# Patient Record
Sex: Female | Born: 1935 | Race: White | Hispanic: No | Smoking: Never smoker
Health system: Southern US, Community
[De-identification: ages and names within clinical notes are randomized; demographics above are authoritative.]

## PROBLEM LIST (undated history)

## (undated) DIAGNOSIS — Z87442 Personal history of urinary calculi: Secondary | ICD-10-CM

## (undated) DIAGNOSIS — C569 Malignant neoplasm of unspecified ovary: Secondary | ICD-10-CM

## (undated) DIAGNOSIS — C50919 Malignant neoplasm of unspecified site of unspecified female breast: Secondary | ICD-10-CM

## (undated) DIAGNOSIS — K219 Gastro-esophageal reflux disease without esophagitis: Secondary | ICD-10-CM

## (undated) DIAGNOSIS — B029 Zoster without complications: Secondary | ICD-10-CM

## (undated) DIAGNOSIS — I10 Essential (primary) hypertension: Secondary | ICD-10-CM

## (undated) DIAGNOSIS — Z9289 Personal history of other medical treatment: Secondary | ICD-10-CM

## (undated) DIAGNOSIS — M1990 Unspecified osteoarthritis, unspecified site: Secondary | ICD-10-CM

## (undated) DIAGNOSIS — N200 Calculus of kidney: Secondary | ICD-10-CM

## (undated) DIAGNOSIS — N6099 Unspecified benign mammary dysplasia of unspecified breast: Secondary | ICD-10-CM

## (undated) DIAGNOSIS — R300 Dysuria: Secondary | ICD-10-CM

## (undated) DIAGNOSIS — I2510 Atherosclerotic heart disease of native coronary artery without angina pectoris: Secondary | ICD-10-CM

## (undated) DIAGNOSIS — R011 Cardiac murmur, unspecified: Secondary | ICD-10-CM

## (undated) DIAGNOSIS — R351 Nocturia: Secondary | ICD-10-CM

## (undated) DIAGNOSIS — Z8543 Personal history of malignant neoplasm of ovary: Secondary | ICD-10-CM

## (undated) DIAGNOSIS — E785 Hyperlipidemia, unspecified: Secondary | ICD-10-CM

## (undated) DIAGNOSIS — N201 Calculus of ureter: Secondary | ICD-10-CM

## (undated) DIAGNOSIS — Z853 Personal history of malignant neoplasm of breast: Secondary | ICD-10-CM

## (undated) DIAGNOSIS — Z803 Family history of malignant neoplasm of breast: Secondary | ICD-10-CM

## (undated) DIAGNOSIS — Z85828 Personal history of other malignant neoplasm of skin: Secondary | ICD-10-CM

## (undated) HISTORY — DX: Malignant neoplasm of unspecified site of unspecified female breast: C50.919

## (undated) HISTORY — DX: Calculus of kidney: N20.0

## (undated) HISTORY — DX: Unspecified benign mammary dysplasia of unspecified breast: N60.99

## (undated) HISTORY — DX: Malignant neoplasm of unspecified ovary: C56.9

## (undated) HISTORY — DX: Family history of malignant neoplasm of breast: Z80.3

## (undated) HISTORY — DX: Essential (primary) hypertension: I10

## (undated) HISTORY — DX: Zoster without complications: B02.9

## (undated) HISTORY — PX: CARDIAC CATHETERIZATION: SHX172

## (undated) HISTORY — DX: Atherosclerotic heart disease of native coronary artery without angina pectoris: I25.10

## (undated) HISTORY — PX: COLONOSCOPY: SHX174

## (undated) HISTORY — PX: APPENDECTOMY: SHX54

## (undated) HISTORY — DX: Hyperlipidemia, unspecified: E78.5

---

## 1974-10-07 HISTORY — PX: TOTAL ABDOMINAL HYSTERECTOMY W/ BILATERAL SALPINGOOPHORECTOMY: SHX83

## 1988-10-07 HISTORY — PX: CHOLECYSTECTOMY: SHX55

## 1999-09-11 ENCOUNTER — Other Ambulatory Visit: Admission: RE | Admit: 1999-09-11 | Discharge: 1999-09-11 | Payer: Self-pay | Admitting: Obstetrics and Gynecology

## 2001-11-20 ENCOUNTER — Other Ambulatory Visit: Admission: RE | Admit: 2001-11-20 | Discharge: 2001-11-20 | Payer: Self-pay | Admitting: Obstetrics and Gynecology

## 2002-01-28 ENCOUNTER — Other Ambulatory Visit: Admission: RE | Admit: 2002-01-28 | Discharge: 2002-01-28 | Payer: Self-pay | Admitting: Radiology

## 2002-02-04 DIAGNOSIS — N6099 Unspecified benign mammary dysplasia of unspecified breast: Secondary | ICD-10-CM

## 2002-02-04 HISTORY — DX: Unspecified benign mammary dysplasia of unspecified breast: N60.99

## 2002-02-10 ENCOUNTER — Encounter (INDEPENDENT_AMBULATORY_CARE_PROVIDER_SITE_OTHER): Payer: Self-pay | Admitting: *Deleted

## 2002-02-10 ENCOUNTER — Ambulatory Visit (HOSPITAL_BASED_OUTPATIENT_CLINIC_OR_DEPARTMENT_OTHER): Admission: RE | Admit: 2002-02-10 | Discharge: 2002-02-10 | Payer: Self-pay | Admitting: General Surgery

## 2002-02-10 HISTORY — PX: OTHER SURGICAL HISTORY: SHX169

## 2004-01-23 ENCOUNTER — Ambulatory Visit (HOSPITAL_COMMUNITY): Admission: RE | Admit: 2004-01-23 | Discharge: 2004-01-23 | Payer: Self-pay

## 2004-01-23 ENCOUNTER — Encounter: Admission: RE | Admit: 2004-01-23 | Discharge: 2004-01-23 | Payer: Self-pay

## 2004-06-13 ENCOUNTER — Ambulatory Visit (HOSPITAL_COMMUNITY): Admission: RE | Admit: 2004-06-13 | Discharge: 2004-06-13 | Payer: Self-pay | Admitting: Gastroenterology

## 2004-08-25 ENCOUNTER — Ambulatory Visit (HOSPITAL_COMMUNITY): Admission: RE | Admit: 2004-08-25 | Discharge: 2004-08-25 | Payer: Self-pay | Admitting: Neurology

## 2005-02-05 ENCOUNTER — Encounter: Admission: RE | Admit: 2005-02-05 | Discharge: 2005-02-05 | Payer: Self-pay | Admitting: General Surgery

## 2005-10-23 ENCOUNTER — Ambulatory Visit (HOSPITAL_COMMUNITY): Admission: RE | Admit: 2005-10-23 | Discharge: 2005-10-23 | Payer: Self-pay | Admitting: Interventional Cardiology

## 2005-11-06 ENCOUNTER — Encounter: Admission: RE | Admit: 2005-11-06 | Discharge: 2005-11-06 | Payer: Self-pay | Admitting: Interventional Cardiology

## 2005-11-11 ENCOUNTER — Inpatient Hospital Stay (HOSPITAL_BASED_OUTPATIENT_CLINIC_OR_DEPARTMENT_OTHER): Admission: RE | Admit: 2005-11-11 | Discharge: 2005-11-11 | Payer: Self-pay | Admitting: Interventional Cardiology

## 2007-02-18 ENCOUNTER — Encounter: Admission: RE | Admit: 2007-02-18 | Discharge: 2007-02-18 | Payer: Self-pay | Admitting: Radiology

## 2008-01-05 ENCOUNTER — Ambulatory Visit (HOSPITAL_COMMUNITY): Admission: RE | Admit: 2008-01-05 | Discharge: 2008-01-05 | Payer: Self-pay | Admitting: Gastroenterology

## 2008-01-13 ENCOUNTER — Encounter: Admission: RE | Admit: 2008-01-13 | Discharge: 2008-01-13 | Payer: Self-pay | Admitting: Gastroenterology

## 2008-02-16 ENCOUNTER — Ambulatory Visit (HOSPITAL_COMMUNITY): Admission: RE | Admit: 2008-02-16 | Discharge: 2008-02-17 | Payer: Self-pay | Admitting: Surgery

## 2008-02-16 ENCOUNTER — Ambulatory Visit: Payer: Self-pay | Admitting: Oncology

## 2008-02-16 ENCOUNTER — Encounter (INDEPENDENT_AMBULATORY_CARE_PROVIDER_SITE_OTHER): Payer: Self-pay | Admitting: Surgery

## 2008-02-16 HISTORY — PX: OTHER SURGICAL HISTORY: SHX169

## 2008-02-17 ENCOUNTER — Ambulatory Visit: Payer: Self-pay | Admitting: Oncology

## 2008-02-19 ENCOUNTER — Encounter (INDEPENDENT_AMBULATORY_CARE_PROVIDER_SITE_OTHER): Payer: Self-pay | Admitting: Surgery

## 2008-02-19 ENCOUNTER — Ambulatory Visit (HOSPITAL_COMMUNITY): Admission: RE | Admit: 2008-02-19 | Discharge: 2008-02-19 | Payer: Self-pay | Admitting: Oncology

## 2008-02-19 ENCOUNTER — Ambulatory Visit: Payer: Self-pay | Admitting: Internal Medicine

## 2008-02-19 ENCOUNTER — Other Ambulatory Visit: Payer: Self-pay | Admitting: Oncology

## 2008-02-19 ENCOUNTER — Inpatient Hospital Stay (HOSPITAL_COMMUNITY): Admission: RE | Admit: 2008-02-19 | Discharge: 2008-03-07 | Payer: Self-pay | Admitting: Surgery

## 2008-02-19 HISTORY — PX: OTHER SURGICAL HISTORY: SHX169

## 2008-02-19 LAB — COMPREHENSIVE METABOLIC PANEL
Albumin: 2.5 g/dL — ABNORMAL LOW (ref 3.5–5.2)
CO2: 22 mEq/L (ref 19–32)
Calcium: 8.6 mg/dL (ref 8.4–10.5)
Chloride: 101 mEq/L (ref 96–112)
Glucose, Bld: 214 mg/dL — ABNORMAL HIGH (ref 70–99)
Sodium: 133 mEq/L — ABNORMAL LOW (ref 135–145)
Total Bilirubin: 0.7 mg/dL (ref 0.3–1.2)
Total Protein: 5.5 g/dL — ABNORMAL LOW (ref 6.0–8.3)

## 2008-02-19 LAB — CBC WITH DIFFERENTIAL/PLATELET
Basophils Absolute: 0 10*3/uL (ref 0.0–0.1)
Eosinophils Absolute: 0.1 10*3/uL (ref 0.0–0.5)
HGB: 8.8 g/dL — ABNORMAL LOW (ref 11.6–15.9)
MCV: 89 fL (ref 81.0–101.0)
MONO%: 9.8 % (ref 0.0–13.0)
NEUT#: 7.9 10*3/uL — ABNORMAL HIGH (ref 1.5–6.5)
Platelets: 244 10*3/uL (ref 145–400)
RDW: 22.9 % — ABNORMAL HIGH (ref 11.3–14.5)

## 2008-03-04 ENCOUNTER — Encounter (INDEPENDENT_AMBULATORY_CARE_PROVIDER_SITE_OTHER): Payer: Self-pay | Admitting: General Surgery

## 2008-03-04 ENCOUNTER — Ambulatory Visit: Payer: Self-pay | Admitting: Vascular Surgery

## 2008-03-14 ENCOUNTER — Inpatient Hospital Stay (HOSPITAL_COMMUNITY): Admission: EM | Admit: 2008-03-14 | Discharge: 2008-03-19 | Payer: Self-pay | Admitting: Emergency Medicine

## 2008-03-25 ENCOUNTER — Ambulatory Visit: Admission: RE | Admit: 2008-03-25 | Discharge: 2008-03-25 | Payer: Self-pay | Admitting: Gynecology

## 2008-04-15 ENCOUNTER — Other Ambulatory Visit: Admission: RE | Admit: 2008-04-15 | Discharge: 2008-04-15 | Payer: Self-pay | Admitting: Obstetrics and Gynecology

## 2008-04-21 ENCOUNTER — Inpatient Hospital Stay (HOSPITAL_COMMUNITY): Admission: EM | Admit: 2008-04-21 | Discharge: 2008-04-25 | Payer: Self-pay | Admitting: Emergency Medicine

## 2008-07-09 ENCOUNTER — Encounter: Admission: RE | Admit: 2008-07-09 | Discharge: 2008-07-09 | Payer: Self-pay | Admitting: Gastroenterology

## 2008-07-12 ENCOUNTER — Encounter: Admission: RE | Admit: 2008-07-12 | Discharge: 2008-07-12 | Payer: Self-pay | Admitting: Surgery

## 2008-08-07 HISTORY — PX: ILEOSTOMY CLOSURE: SHX1784

## 2008-08-23 ENCOUNTER — Encounter (INDEPENDENT_AMBULATORY_CARE_PROVIDER_SITE_OTHER): Payer: Self-pay | Admitting: Surgery

## 2008-08-23 ENCOUNTER — Inpatient Hospital Stay (HOSPITAL_COMMUNITY): Admission: RE | Admit: 2008-08-23 | Discharge: 2008-08-29 | Payer: Self-pay | Admitting: Surgery

## 2008-09-09 ENCOUNTER — Ambulatory Visit: Admission: RE | Admit: 2008-09-09 | Discharge: 2008-09-09 | Payer: Self-pay | Admitting: Gynecology

## 2008-09-14 ENCOUNTER — Ambulatory Visit: Payer: Self-pay | Admitting: Oncology

## 2008-10-07 DIAGNOSIS — C50919 Malignant neoplasm of unspecified site of unspecified female breast: Secondary | ICD-10-CM

## 2008-10-07 HISTORY — DX: Malignant neoplasm of unspecified site of unspecified female breast: C50.919

## 2008-10-19 ENCOUNTER — Ambulatory Visit (HOSPITAL_COMMUNITY): Admission: RE | Admit: 2008-10-19 | Discharge: 2008-10-19 | Payer: Self-pay | Admitting: Surgery

## 2008-11-17 ENCOUNTER — Ambulatory Visit: Payer: Self-pay | Admitting: Oncology

## 2008-11-21 ENCOUNTER — Ambulatory Visit (HOSPITAL_COMMUNITY): Admission: RE | Admit: 2008-11-21 | Discharge: 2008-11-21 | Payer: Self-pay | Admitting: Gynecology

## 2008-11-23 ENCOUNTER — Ambulatory Visit: Admission: RE | Admit: 2008-11-23 | Discharge: 2008-11-23 | Payer: Self-pay | Admitting: Gynecology

## 2009-02-03 ENCOUNTER — Ambulatory Visit: Payer: Self-pay | Admitting: Oncology

## 2009-03-16 ENCOUNTER — Encounter: Admission: RE | Admit: 2009-03-16 | Discharge: 2009-03-16 | Payer: Self-pay | Admitting: Radiology

## 2009-03-21 ENCOUNTER — Ambulatory Visit: Payer: Self-pay | Admitting: Genetic Counselor

## 2009-03-23 ENCOUNTER — Ambulatory Visit: Admission: RE | Admit: 2009-03-23 | Discharge: 2009-05-29 | Payer: Self-pay | Admitting: Radiation Oncology

## 2009-03-24 ENCOUNTER — Ambulatory Visit: Payer: Self-pay | Admitting: Oncology

## 2009-04-11 ENCOUNTER — Encounter: Admission: RE | Admit: 2009-04-11 | Discharge: 2009-04-11 | Payer: Self-pay | Admitting: Surgery

## 2009-04-13 ENCOUNTER — Ambulatory Visit (HOSPITAL_BASED_OUTPATIENT_CLINIC_OR_DEPARTMENT_OTHER): Admission: RE | Admit: 2009-04-13 | Discharge: 2009-04-13 | Payer: Self-pay | Admitting: Surgery

## 2009-04-13 ENCOUNTER — Encounter (INDEPENDENT_AMBULATORY_CARE_PROVIDER_SITE_OTHER): Payer: Self-pay | Admitting: Surgery

## 2009-04-13 HISTORY — PX: BREAST LUMPECTOMY: SHX2

## 2009-04-27 ENCOUNTER — Ambulatory Visit: Payer: Self-pay | Admitting: Oncology

## 2009-05-01 ENCOUNTER — Ambulatory Visit: Payer: Self-pay | Admitting: Oncology

## 2009-05-10 ENCOUNTER — Ambulatory Visit (HOSPITAL_COMMUNITY): Admission: RE | Admit: 2009-05-10 | Discharge: 2009-05-10 | Payer: Self-pay | Admitting: Gynecology

## 2009-05-24 ENCOUNTER — Ambulatory Visit: Admission: RE | Admit: 2009-05-24 | Discharge: 2009-05-24 | Payer: Self-pay | Admitting: Gynecology

## 2009-05-29 ENCOUNTER — Ambulatory Visit: Admission: RE | Admit: 2009-05-29 | Discharge: 2009-06-21 | Payer: Self-pay | Admitting: Radiation Oncology

## 2009-07-28 ENCOUNTER — Ambulatory Visit: Payer: Self-pay | Admitting: Oncology

## 2009-12-07 ENCOUNTER — Ambulatory Visit: Payer: Self-pay | Admitting: Oncology

## 2009-12-18 ENCOUNTER — Ambulatory Visit (HOSPITAL_COMMUNITY): Admission: RE | Admit: 2009-12-18 | Discharge: 2009-12-18 | Payer: Self-pay | Admitting: Gynecology

## 2009-12-20 ENCOUNTER — Ambulatory Visit: Admission: RE | Admit: 2009-12-20 | Discharge: 2009-12-20 | Payer: Self-pay | Admitting: Gynecology

## 2010-01-31 ENCOUNTER — Ambulatory Visit: Payer: Self-pay | Admitting: Oncology

## 2010-06-29 ENCOUNTER — Ambulatory Visit: Admission: RE | Admit: 2010-06-29 | Discharge: 2010-06-29 | Payer: Self-pay | Admitting: Gynecology

## 2010-08-02 ENCOUNTER — Ambulatory Visit: Payer: Self-pay | Admitting: Oncology

## 2010-10-27 ENCOUNTER — Encounter: Payer: Self-pay | Admitting: Neurology

## 2010-10-28 ENCOUNTER — Encounter: Payer: Self-pay | Admitting: Gynecology

## 2010-10-28 ENCOUNTER — Encounter: Payer: Self-pay | Admitting: Surgery

## 2010-12-21 ENCOUNTER — Ambulatory Visit: Payer: Medicare Other | Attending: Gynecology | Admitting: Gynecology

## 2010-12-21 ENCOUNTER — Other Ambulatory Visit: Payer: Self-pay | Admitting: Gynecology

## 2010-12-21 DIAGNOSIS — Z79899 Other long term (current) drug therapy: Secondary | ICD-10-CM | POA: Insufficient documentation

## 2010-12-21 DIAGNOSIS — D3910 Neoplasm of uncertain behavior of unspecified ovary: Secondary | ICD-10-CM | POA: Insufficient documentation

## 2010-12-21 DIAGNOSIS — I10 Essential (primary) hypertension: Secondary | ICD-10-CM | POA: Insufficient documentation

## 2010-12-21 DIAGNOSIS — Z853 Personal history of malignant neoplasm of breast: Secondary | ICD-10-CM | POA: Insufficient documentation

## 2010-12-21 LAB — CA 125: CA 125: 9.9 U/mL (ref 0.0–30.2)

## 2010-12-25 NOTE — Consult Note (Addendum)
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  INTERVAL HISTORY:  The patient returns as previously scheduled.  Since her last visit, she has done well.  She denies any GI or GU symptoms. Has no pelvic pain, pressure, vaginal bleeding or discharge.  Her functional status is excellent.  She continues to take Arimidex under the direction Dr. Truett Perna for treatment of her breast cancer.  HISTORY OF PRESENT ILLNESS:  The patient underwent initial surgery in 1976 at [facility] base in [place].  The final pathology report was lost, although there is some notation of the patient having an ovarian tumor with excrescences.  She received no other therapy but developed symptoms of small-bowel obstruction and underwent a laparoscopic procedure complicated by intestinal perforation requiring exploratory laparotomy and resection of terminal ileum, ascending and transverse colon.  At that surgery, she was found to have small deposits of serous carcinoma associated with fat necrosis.  Presuming this is a low malignant potential tumor given the time course of the disease, we elected to follow the patient and she has been followed since 2009 with no evidence of recurrent disease.  CA-125 has been normal.  The last one was 9 units/mL in September of 2011.  PAST MEDICAL HISTORY:  Hypertension and breast cancer.  CURRENT MEDICATIONS:  Diovan, Iso-Bid, Arimidex, Nexium, folic acid, Questran, and Imodium.  DRUG ALLERGIES:  None.  PAST SURGICAL HISTORY:  TAH-BSO in 1976, cholecystectomy, laparoscopy and  exploratory laparotomy with small-bowel resection.  SOCIAL HISTORY:  The patient is widowed.  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  She does not smoke.  FAMILY HISTORY:  Sister and cousin with breast cancer.  REVIEW OF SYSTEMS:  Ten-point comprehensive review of systems negative except as noted above.  PHYSICAL EXAMINATION:  VITAL SIGNS:  Weight 147 pounds, blood pressure 130/50.  Remainder of vital signs are in the record. GENERAL:  The patient is a healthy white female in no acute distress. HEENT:  Negative. NECK:  Supple without thyromegaly.  There is no supraclavicular or inguinal adenopathy. ABDOMEN:  Soft, nontender.  No mass, organomegaly, ascites or hernias are noted. PELVIC EXAM:  EG BUS, vagina, bladder, and urethra are normal.  Cervix and uterus are surgically absent.  Adnexa without masses.  Rectovaginal exam confirms. EXTREMITIES:  Lower extremities without edema or varicosities.  IMPRESSION:  Low malignant potential tumor of the ovary clinically without evidence of disease.  PLAN:  CA-125 will be obtained today.  The patient will return to see me in 6 months for continuing surveillance.     De Blanch, M.D.     DC/MEDQ  D:  12/21/2010  T:  12/21/2010  Job:  [id number]  cc:   Harriett Sine  Aundria Rud, R.N. [address] [place], [place] [address]  G. Rolm Baptise, M.D. Fax: [phone]  Edwena Felty. Romine, M.D. Fax: [phone]  Danise Edge, M.D. Fax: [phone]  Lyn Records, M.D. Fax: [phone]  Radene Gunning, M.D., Ph.D. Fax: [phone]  Currie Paris, M.D. [address], [address] [place] [address]  Electronically Signed by De Blanch M.D. on 12/26/2010 10:21:21 AM

## 2010-12-27 NOTE — Op Note (Signed)
NAMELOUELLA, MEDAGLIA              ACCOUNT NO.:  [id number]  MEDICAL RECORD NO.:  [id number]          PATIENT TYPE:  AMB  LOCATION:  DSC                          FACILITY:  [facility]  PHYSICIAN:  Currie Paris, M.D.DATE OF BIRTH:  22-Apr-1934  DATE OF PROCEDURE:  04/13/2009 DATE OF DISCHARGE:                              OPERATIVE REPORT   PREOPERATIVE DIAGNOSIS:  Carcinoma, right breast, upper outer quadrant, clinical stage 0.  POSTOPERATIVE DIAGNOSIS:  Carcinoma, right breast, upper outer quadrant, clinical stage 0.  OPERATION:  Needle localization, right lumpectomy with blue dye injection and axillary sentinel lymph node biopsy.  SURGEON:  Currie Paris, MD  ANESTHESIA:  General.  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 After lengthy discussion with the patient, we elected to do a needle- guided lumpectomy.  Because of the suspicion for invasion and the patient's wished to hopefully not returned to the operating room, we elected to proceed to a sentinel node as well.  DESCRIPTION OF PROCEDURE:  The patient was seen in the holding area and she had no further questions.  We confirmed the surgery as outlined above and I initialed the right breast as the operative site.  I reviewed the needle localization films and they showed two guidewires were going in and there were some axillae in the patient's skin marking the area of abnormality.  The guidewires were both entered somewhat lateral and inferior and tracked superior and medial.  The patient was taken to the operating room and after satisfactory general anesthesia had been obtained, the time-out was done.  The right breast was then injected with dilute methylene blue.  This was massaged in and a full prep and drape done.  I used a Neoprobe and identified a hot area in the axilla and made a transverse incision,  divided the subcutaneous tissues and almost immediately found a blue lymph node which had counts of about 400 and was removed.  Once this was out, counts dropped to 0 in the axilla and I could find no other hot areas, no other evidence of any blue lymphatics nor any evidence of any palpably abnormal nodes.  While waiting for pathology, I put a moist sponge here.  Attention was turned back to the breast.  I made a curvilinear incision essentially centered on the axilla on the skin and the upper outer quadrant and extending laterally to the more lateral of the two guidewires.  I raised the skin flap first laterally, then inferiorly and medially and finally laterally, so I could get the one guide wire into the wound.  With that in, I raised little bit more of an inferior flap until I could get the second guidewire in the wound.  Starting out lateral to the guidewire entry, I try to divide the breast tissue down towards the chest wall and then take a wide area of tissue around both of these guidewires.  Near the superior lateral area, I thought what look like dilated milk ducts with thick material in them and I could not  discern whether this represented some DCIS or whether this was some severe fibrocystic change.  I made a node of this area and completed the lumpectomy.  There was a little area of induration palpable in the middle of the specimen.  Specimen mammogram showed appropriate excision of the area in question.  While waiting for the specimen mammogram report, I went ahead and took some additional deep margin because I had left a couple of millimeters of fatty tissue right on the fascia and I made sure that was completely excised.  I then went back to the one area where I had seen the abnormal tissue and excised another centimeter thick piece of tissue, which was really somewhat at the superolateral edge of my primary excision and I excsised to what looked like normal fatty tissue  with no evidence of any milk ducts and was really right into the subcutaneous tissue here.  I made sure everything was dry.  I put Marcaine in to help with postop pain relief.  I elevated the breast off of the chest wall and tried to close the deep breast tissue and then some of the more superficial, but because of large excision, there was going to be a cavity left behind. I closed the skin and subcu with Vicryl and Monocryl followed by Dermabond.  Dr. Luisa Hart reported that the lymph node was negative, so I closed that incision after putting some Marcaine and I making sure everything was dry.  Again, Vicryl, Monocryl, and Dermabond were used.  The patient tolerated the procedure well and there were no complications.  All counts were correct.     Currie Paris, M.D.     CJS/MEDQ  D:  04/13/2009  T:  04/14/2009  Job:  [id number]  cc:   Aram Beecham P. Romine, M.D. Quenton Fetter, M.D.  Electronically Signed by Cyndia Bent M.D. on 12/27/2010 07:41:32 AM

## 2011-01-14 LAB — COMPREHENSIVE METABOLIC PANEL
ALT: 41 U/L — ABNORMAL HIGH (ref 0–35)
Alkaline Phosphatase: 101 U/L (ref 39–117)
CO2: 27 mEq/L (ref 19–32)
Calcium: 9.3 mg/dL (ref 8.4–10.5)
GFR calc non Af Amer: 53 mL/min — ABNORMAL LOW (ref >60)
Glucose, Bld: 96 mg/dL (ref 70–99)
Potassium: 4.3 mEq/L (ref 3.5–5.1)
Sodium: 140 mEq/L (ref 135–145)

## 2011-01-14 LAB — URINALYSIS, ROUTINE W REFLEX MICROSCOPIC
Protein, ur: NEGATIVE mg/dL
Urobilinogen, UA: 0.2 mg/dL (ref 0.0–1.0)

## 2011-01-14 LAB — CBC
HCT: 33.9 % — ABNORMAL LOW (ref 36.0–46.0)
Hemoglobin: 11.5 g/dL — ABNORMAL LOW (ref 12.0–15.0)
MCHC: 34.1 g/dL (ref 30.0–36.0)
RBC: 3.68 MIL/uL — ABNORMAL LOW (ref 3.87–5.11)

## 2011-01-14 LAB — DIFFERENTIAL
Basophils Absolute: 0.1 10*3/uL (ref 0.0–0.1)
Basophils Relative: 1 % (ref 0–1)
Eosinophils Absolute: 0.3 10*3/uL (ref 0.0–0.7)
Neutrophils Relative %: 64 % (ref 43–77)

## 2011-01-14 LAB — URINE MICROSCOPIC-ADD ON

## 2011-01-22 LAB — CA 125: CA 125: 6.9 U/mL (ref 0.0–30.2)

## 2011-02-19 NOTE — Op Note (Signed)
[name], [name]              ACCOUNT NO.:  [id number]   MEDICAL RECORD NO.:  [id number]          PATIENT TYPE:  INP   LOCATION:  1321                         FACILITY:  [facility]   PHYSICIAN:  Currie Paris, M.D.DATE OF BIRTH:  [birth year]   DATE OF PROCEDURE:  08/23/2008  DATE OF DISCHARGE:                               OPERATIVE REPORT   OFFICE MEDICAL RECORD NUMBER [id number].   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   OPERATION:  Takedown of ileostomy with primary anastomosis and biopsy of  what appeared to be chronic fat necrosis of anterior abdominal wall.   SURGEON:  Currie Paris, M.D.   ASSISTANT:  Angelia Mould. Derrell Lolling, M.D.   ANESTHESIA:  General endotracheal.   CLINICAL HISTORY:  This is a 77 year old lady who underwent a  laparoscopy about 6  months ago and was found to have some extensive  peritoneal tumor.  The patient then was going to be scheduled for  further workup but unfortunately developed a small bowel leak and had to  be taken back to the operating room urgently about 3 days later at which  point she went underwent resection of her, most of her ileum, ascending  and transverse colon including the tumor process which appeared to be  involving the transverse colon.  An ileostomy fistula were created.  She  has recovered fully from the surgery.  The only problem she has had on a  chronic nature has been a high ileostomy output requiring IV fluids to  maintain hydration.  She came back in today for takedown of her  ileostomy.   DESCRIPTION OF PROCEDURE:  The patient was seen in the holding area and  she had no further questions.  We confirmed the surgery as noted above.   The patient was taken to the operating room and after satisfactory  general endotracheal anesthesia had been obtained a Foley catheter was  placed and the abdomen  was prepped and draped.  I put a sterile  ileostomy bag across the ileostomy because I did not want to damage it  with a suture in case we were unable to complete the procedure as  planned.   The old scar was opened for about three-quarters of its length and a  widened area in the very middle of the scar was likewise excised.  I was  able to find an entry point into the abdomen at the upper end of my new  incision.  With my finger I was able to bluntly get further down and  found one loop of bowel stuck to the midline but then below that free so  I got into the peritoneal cavity again below the widened scar.  I was  then able to open up the fascia the entire length of my incision and  take the one loop of small bowel that was stuck at the wide scar down  sharply with scissors.  A few more loops were taken down the lower part  of the incision.  I then started working to the left side and was able to take down some  adhesions of what appeared to be omentum and bowel to the anterior  abdominal wall until I could identify the colon coming out through the  mucous fistula.  I had to take some omentum that was stuck to it down  between clamps.  Once I clearly had this freed up I went ahead and made  a perimeter incision on the skin and took the mucous fistula down and  brought it into the abdominal wall cavity.  I then freed it up some more  so that I had no tension and that it would reach well across the  midline.  It looked like I would need to resect about 3-5 cm of the  colon where it had come through the abdominal wall.   I then turned my attention to the ileostomy.  A few loops of small bowel  were stuck to this and these were taken down sharply and I was able to  get around it.  I then freed it up from the skin, opening the skin  incision and freed it from the subcutaneous tissues and brought the  mucous fistula in back into the abdominal cavity.  I put a clamp across  it so we were  not leaking fluids.  I freed this up where it appeared to  be attached and stuck with adhesions to the lateral abdominal wall and  mobilized it medially so that it too came well across the midline to the  left.  I appeared to have healthy bowel on both sides with good blood  supply.   I tacked the antimesenteric borders of the two ends together.  I opened  both the small bowel and the colon in the area that I planned to remove,  inserted the GIA and fired it.  This appeared to produce a nice staple  line with no bleeding.   The common defect was then closed with the TA60 stapler.  Initially I  put it across, cut the bowel off but it did not fire so I had to reapply  it, then fire it and then cut off a little bit more tissue.  This  produced a nice anastomosis which was two fingers patent.  There was no  tension on it.  It appeared viable and healthy.  I could get fluid  through it.  I put a few more sutures in to tack the crotch together to  make sure there was no tension on that corner.  There really was no  mesentery to tack together and the remaining small bowel had not really  been freed up so I thought everything was stuck and there would be no  likelihood of an internal hernia developing here.   We changed gloves and instruments.  We irrigated copiously with about 3  liters of saline.  The fascia was then closed.  I used a #1 Novofil to  close the fascia of both the ileostomy and mucous fistula site.  I then  used a running #1 looped PDS to close the midline fascia interspersing  #1 Novofil simple sutures in to reinforce this suture line.  It appeared  to close successfully with minimal tension and the fascia all appeared  to be healthy.   The wound was irrigated and the skin closed with staples.  I put some  Telfa wicks between them.  The patient tolerated the procedure well.  There were  no operative complications.   Not mentioned above was as we were taking down the mucous  fistula there  was a hard mass like structure that was attached to the anterior  abdominal wall which I excised.  It appeared to have some liquefied fat  and I think represented fat necrosis bit this was sent for pathology.  Also of note is that we really did not visualize any other organs other  than some loops of small bowel.  The liver was not visualized at all  because of adhesions.  We did not dissect up into that area and we did  not get into the pelvis whatsoever because of dense adhesions down there  and did not see a need to go down in that area.  There was no gross  evidence of residual tumor.  This completed the procedure.  Estimated  blood loss was about 100 mL.  There no complications.  All counts were  correct.      Currie Paris, M.D.  Electronically Signed     CJS/MEDQ  D:  08/23/2008  T:  08/24/2008  Job:  [id number]

## 2011-02-19 NOTE — Discharge Summary (Signed)
NAMEBRIYAH, [name]              ACCOUNT NO.:  [id number]   MEDICAL RECORD NO.:  [id number]          PATIENT TYPE:  INP   LOCATION:  1524                         [facility]ACILITY:  [facility]   PHYSICIAN:  Currie Paris, M.D.DATE OF BIRTH:  [birth year]   DATE OF ADMISSION:  02/19/2008  DATE OF DISCHARGE:  03/07/2008                               DISCHARGE SUMMARY   FINAL DIAGNOSES:  1. 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  2. Abdominal sepsis secondary to iatrogenic bowel perforation.  3. Acute respiratory insufficiency secondary to #2.  4. Prerenal azotemia secondary to #2.  5. History of hypertension.   CLINICAL HISTORY:  [name] Is a 77 year old lady who had a  laparoscopy 3 days prior to this admission showing what appeared to be  miliary metastatic disease in the abdomen.  She was discharged after the  laparoscopy, but admitted on May 15 with an acute abdomen.   HOSPITAL COURSE:  The patient was taken urgently to the operating room.  She was found to have some fecal contamination, confined fairly well to  the left lower quadrant with a fair amount of dissection of air up into  the left retroperitoneum.  No clear-cut bowel perforation was found.  There was a large tumor involving the cecum that was palpable.  Extensive adhesions, it was not clear whether these were from tumor or  old surgery into the right lower quadrant, as well as multiple tiny  nodules of what appeared to be tumor scattered on the peritoneum.   The patient underwent the exploration with resection of ileum, ascending  and transverse colon with a mucous fistula and loop ileostomy.  Copious  irrigation was performed of the abdomen.  Postoperatively she had to be  maintained on a ventilator and pressors for a period of time.  She was  kept on DVT prophylaxis and begun, almost immediately, on TNA.  After  several days of ventilator support she was able to be weaned and  extubated.   Once she was  extubated, we were able to get her NG-tube out and start  her on some oral diet.  Her wound, mostly, was healing okay but had some  fascial inflammatory process in the middle of the wound.  Her ostomies  appeared to do okay.  Her white count gradually came down.  Pathology  looked like a low-grade, serous papillary carcinoma.  She was continued  on antibiotics for about 2 weeks.  She was restarted on her blood  pressure medications.  She developed a fair amount of edema from  sequestration of all the IV fluids given during the emergent  resuscitation, and was eventually diuresed of most of this by the time  of discharge.  She developed some intra-abdominal collections which were  drained, but were fairly clear and did not grow bacteria.  Those were  discontinued prior to discharge.   Overall, her course was one of gradual improvement to the point where  she was able to be discharged to resume her usual medications.  She was  to have limited activities and to followup in my office in about  4 days  for wound check and further followup.      Currie Paris, M.D.  Electronically Signed     CJS/MEDQ  D:  03/16/2008  T:  03/16/2008  Job:  [id number]

## 2011-02-19 NOTE — Consult Note (Signed)
NAMEMARVELINE, [name]              ACCOUNT NO.:  [id number]   MEDICAL RECORD NO.:  [id number]          PATIENT TYPE:  INP   LOCATION:  1334                         FACILITY:  [facility]   PHYSICIAN:  Leighton Roach. Truett Perna, M.D. DATE OF BIRTH:  [birth year]   DATE OF CONSULTATION:  02/17/2008  DATE OF DISCHARGE:                                 CONSULTATION   REFERRING PHYSICIAN:  Dr. Jamey Ripa.   PATIENT IDENTIFICATION:  [name] is a 77 year old with a new  diagnosis of abdominal carcinomatosis.   HISTORY OF PRESENT ILLNESS:  [name] developed exertional dyspnea  and angina a few months ago.  She reports undergoing a negative  cardiac evaluation by Dr. Katrinka Blazing.  She was found to have severe anemia.  Her symptoms resolved after a red blood cell transfusion.   She was referred to Dr. Sherin Quarry.  An upper endoscopy on January 12, 2008,  was unremarkable.  A colonoscopy on the same day revealed a mass lesion  in the ascending colon measuring 3 cm.  A biopsy ([id number]) revealed  focal atypia associated with psammomatous calcifications.  A polyp from  the ascending colon was a tubular adenoma.   She underwent a CT of the abdomen and pelvis on April 8.  A questionable  2.1 cm lymph node was noted adjacent to the distal esophagus.  There was  no other evidence for metastatic disease in the abdomen.  Minimal edema  was noted at the splenic flexure.  A mass could not be visualized in the  ascending colon.   She was referred to Dr. Jamey Ripa for resection of the colon mass.  She was  taken to the Operating Room on May 12.  Dr. Jamey Ripa performed a  diagnostic laparoscopy.  He noted multiple peritoneal implants.  Several  of these were biopsied.  Peritoneal washings were sent for cytology.  A  decision was made to abort the planned colectomy.   The pathology from this procedure is pending.   PAST MEDICAL HISTORY:  1. Gastroesophageal reflux disease.  2. Hypertension.  3. History of angina.  4. G0, P0,  maintained on hormone replacement from the time of a      hysterectomy at age 72 until 2007.   PAST SURGICAL HISTORY:  1. Status post cholecystectomy.  2. Hysterectomy and bilateral oophorectomy at age 57 while living in      [place].  She reports the pathology was benign.  3. History of benign breast surgery.   MEDICATIONS ON ADMISSION:  1. Nexium 40 mg daily.  2. Vitamin D 50,000 units every other week.  3. Caduet 5/20 daily.  4. Diovan/HCTZ 320/25 daily.  5. Carvedilol 25 mg b.i.d.  6. Isosorbide 30 mg daily.  7. Ferrous sulfate t.i.d.  8. Folic acid 400 mcg daily.  9. Multivitamin daily.  10.Aspirin 81 mg daily.  11.Os-Cal 600 mg three times per day.  12.Vitamin B12 1000 mcg a day.   ALLERGIES:  NO KNOWN DRUG ALLERGIES.   FAMILY HISTORY:  Her sister had breast cancer in her 25s.  A maternal  cousin had breast cancer in her 39s.  She has two sisters and one  brother.  No other family history of cancer.   SOCIAL HISTORY:  She lives with husband in [place].  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  She does not use tobacco or alcohol.  She  has no previous transfusion history.  She denies risk factors for HIV  and hepatitis.   REVIEW OF SYSTEMS:  CONSTITUTIONAL:  Negative.  RESPIRATORY:  She  reports recent exertional dyspnea with a mild cough.  The dyspnea  improved after the red cell transfusion.  GU:  Negative.  GI:  She had  one episode of rectal bleeding in February.  She reports intermittent  periumbilically pain.  This has not been a consistent problem.  This has  been present intermittently for years.  GYN:  Negative.  NEUROLOGIC:  Negative.  SKIN:  Negative.   PHYSICAL EXAMINATION:  VITAL SIGNS:  Pressure 153/63, pulse 69,  temperature 95.8, oxygen saturation 92% on room air.  HEENT:  Neck without mass.  LUNGS:  Clear.  CARDIAC:  Regular rhythm.  ABDOMEN:  Diffusely tender.  There are umbilical and left lower quadrant  surgical incision sites.   EXTREMITIES:  No edema.  LYMPH NODES:  No palpable cervical, clavicular, axillary, or inguinal  nodes.   Labs from May 6:  Hemoglobin 9.6, platelets 279,000, white count 6.3,  MCV 89.5, BUN 18, creatinine 1.1, alkaline phosphatase 63, bilirubin  0.6, albumin 3.3.  Labs from May 12:  CEA 0.8.  CA125 25.4.   IMPRESSION:  1. Abdominal carcinomatosis, status post biopsies of peritoneal      nodules on May 12 with the pathology pending.  2. Right ascending colon mass - ? intrinsic versus extrinsic.  3. History of anemia, likely related to gastrointestinal blood loss      and iron deficiency, status post a red cell transfusion prior to      surgery.  4. Remote history of a hysterectomy and bilateral oophorectomy.  5. Family history of breast cancer, status post a negative bilateral      breast MRI on Feb 18, 2007.   [name] has been diagnosed with abdominal carcinomatosis.  There is  a right colon mass.   The differential diagnosis includes a primary peritoneal carcinoma,  colorectal cancer, and metastatic carcinoma from another site.   Ovarian cancer is less likely given the history of a bilateral  nephrectomy, the lack of significant ascites, and the normal CA125.   The pathology from May 12 is pending.   I discussed the differential diagnosis with [name] and her husband.  We will await the final pathology and additional staging studies prior  to making treatment recommendations.   She will be scheduled for an outpatient oncology visit within the next 1  week.  We will likely recommend a staging PET scan.      Leighton Roach Truett Perna, M.D.  Electronically Signed     GBS/MEDQ  D:  02/17/2008  T:  02/17/2008  Job:  [id number]   cc:   Tasia Catchings, M.D.  Fax: [phone]   Lyn Records, M.D.  Fax: [phone]   Currie Paris, M.D.  [address]  [place]  [place] [address]

## 2011-02-19 NOTE — Discharge Summary (Signed)
[name], [name]              ACCOUNT NO.:  [id number]   MEDICAL RECORD NO.:  [id number]          PATIENT TYPE:  INP   LOCATION:  1321                         FACILITY:  [facility]   PHYSICIAN:  Currie Paris, M.D.DATE OF BIRTH:  10/25/1933   DATE OF ADMISSION:  08/23/2008  DATE OF DISCHARGE:  08/29/2008                               DISCHARGE SUMMARY   FINAL DIAGNOSES:  1. 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  2. Serous carcinoma of the abdominal cavity.   CLINICAL HISTORY:  This is a 77 year old lady who underwent a laparotomy  about 6 months ago with resection of her ileum and proximal colon up to  about the distal transverse colon with production of an ileostomy and  mucous fistula.  She had had high ileostomy outputs and was maintained  on nocturnal IV fluids to continue hydration.  She was admitted  electively for takedown of her ostomy with reanastomosis.  She was noted  to have had a prior serous carcinoma of the abdomen which was the  etiology of her initial surgical intervention.  A preoperative CT scan  had been unrevealing.   HOSPITAL COURSE:  The patient was admitted and taken to the operating  room where her ileostomy was taken down and a primary anastomosis was  done.  An area of what appeared to be fat necrosis on the anterior  abdominal wall left upper quadrant was resected.  She tolerated the  procedure well.  Initially she appeared to be fairly stable although a  little bit volume depleted on the first day. She received some extra IV  fluids.  She continued to improve over the next couple of days.  Her  abdomen seemed to be benign and she was able to gradually increase her  diet.  By 11/23, her sixth postop day she was feeling well, felt able to  go home. Her abdomen was soft and benign.  Her wound was cleaned and  redressed.  She was sent home on diet as tolerated and to follow-up with  probable need for Imodium/Lomotil and/or  cholestyramine.  She had an  appointment scheduled both with myself and with Dr. Merleen Milliner her  gastroenterologist at discharge.   Pathology report showed persistent evidence of serous carcinoma on  surfaces of the ileostomy colostomy as well as on the area of fat  necrosis that we resected.  This was discussed with the patient prior to  her discharge and plans made for her to follow-up with her oncologist  Dr. Loree Fee after surgery.   Laboratory studies included preoperative hemoglobin of 12.9 and follow-  up just prior to discharge to 10.3.  Electrolytes were basically normal  with exception of one episode where her creatinine had gone to 1.55 but  was otherwise in the 0.9 to 0.8 range.  Urinalysis had been negative.  Albumin had drifted down slightly on discharge to 2.7.  We did culture  of the area of the fat necrosis that grew some Proteus so she was  maintained on antibiotics during her hospital stay.  EKG at admission  showed normal sinus  with possible left atrial enlargement.      Currie Paris, M.D.  Electronically Signed     CJS/MEDQ  D:  09/14/2008  T:  09/14/2008  Job:  [id number]

## 2011-02-19 NOTE — Consult Note (Signed)
[name], [name]              ACCOUNT NO.:  [id number]   MEDICAL RECORD NO.:  [id number]          PATIENT TYPE:  OUT   LOCATION:  GYN                          FACILITY:  [facility]   PHYSICIAN:  De Blanch, M.D.DATE OF BIRTH:  [birth year]   DATE OF CONSULTATION:  03/25/2008  DATE OF DISCHARGE:                                 CONSULTATION   CHIEF COMPLAINT:  Low-grade papillary serous carcinoma.   HISTORY OF PRESENT ILLNESS:  A 77 year old white married female seen in  consultation at the request of Dr. Mancel Bale regarding newly-  diagnosed abdominal carcinomatosis with a tumor showing low-grade  papillary serous changes with psammoma bodies.   The patient developed a rapid-onset anemia over the last several months.  Evaluation showed a lesion in the ascending colon that was biopsied and  had no cancer but suspicious for cancer.  CT scan showed no mass but  slightly questionably enlarged lymph nodes in the paraesophageal region  and some thickening in the splenic flexure.  The patient underwent  diagnostic laparoscopy on Feb 16, 2008.  At the time of the laparoscopy  she was found to have carcinomatosis.  Biopsy of the lesion showed  atypical epithelium associated with psammomatous calcifications.  Unfortunately, the patient had a postoperative intestinal perforation  with retroperitoneal abscess requiring exploratory laparotomy on May 21, 2008.  She apparently had extensive adhesions, ultimately requiring  resection of the terminal ileum and ascending and transverse colon with  creation of an ileostomy and mucous fistula.  Since that procedure and  recovery in the intensive care unit, she has been doing wound dressing  changes for an open wound and managing her ileostomy.   The patient's past medical history is interesting in that she underwent  surgery at [facility] in [place] in October 1976.  At that  time she had a right ovarian cyst measuring  approximately 6.5 cm with  excrescences on the surface.  There was also questionable seeding in the  cul-de-sac.  The patient underwent a total abdominal hysterectomy and  bilateral salpingo-oophorectomy.  Unfortunately, pathology report is not  available.   PAST MEDICAL HISTORY:  Medical illnesses:  Hypertension.   DRUG ALLERGIES:  None.   CURRENT MEDICATIONS:  Diovan, Isorbid, calcium, Nexium and folic acid.   PAST SURGICAL HISTORY:  TAH-BSO, cholecystectomy, breast biopsy.   SOCIAL HISTORY:  The patient is married.  She is a former [employer] operational officer.  She does not smoke.   FAMILY HISTORY:  The patient has a sister and a cousin with breast  cancer.   REVIEW OF SYSTEMS:  A 10-point comprehensive review of systems negative  except as noted above.   PHYSICAL EXAM:  Height 5 feet 8 inches, weight 159 pounds, blood  pressure 142/66, pulse 76, respiratory rate 20.  GENERAL:  The patient is a healthy white female in no acute distress.  HEENT:  Negative.  NECK:  Supple without thyromegaly.  ABDOMEN:  Soft and nontender.  She has wound dressings in place and  these are not removed, also an ileostomy stoma is visualized and appears  healthy.  LOWER  EXTREMITIES:  Without edema or varicosities.   IMPRESSION:  Apparent carcinomatosis with low-grade papillary serous  carcinoma.  Given her past history of excrescences on the ovary and  implants in the cul-de-sac in 1976, I believe this is most likely a low  malignant potential tumor.  Further supporting that [name] is that on CT  scan, calcifications are noted on peritoneal surfaces which are  consistent with a chronic low-grade tumor as well.   RECOMMENDATIONS:  Obviously the patient's primary concern at the present  time is recovering from her surgical procedure.  I had a lengthy  discussion with the patient, her husband and daughter regarding the  natural history of borderline tumors and low malignant potential tumors  and would recommend  that she be followed.  There is no evidence that  adding chemotherapy to her regimen would improve her overall prognosis.  I indicated to the patient and her husband that this is predominantly a  surgical disease.  I would certainly be happy to assist Dr. Jamey Ripa when  it is time to take down her ileostomy and if there were any obvious  disease which we could resect without too much morbidity, I would  suggest debulking (such as the omentum).  Otherwise, I would recommend  serial follow-up exams and possibly a CT scan for reassessment  approximately 6 months after her ileostomy takedown.  It should be noted  that her preoperative CA-125 was 25.4 units/mL CEA 0.8 units/mL and  therefore it would seem that these tumor markers would not be useful in  follow-up.      De Blanch, M.D.  Electronically Signed     DC/MEDQ  D:  03/25/2008  T:  03/25/2008  Job:  [id number]   cc:   Telford Nab, R.N.  [address]  [place], [place] [address]   Currie Paris, M.D.  [address]  [place]  [place] [address]   Leighton Roach. Truett Perna, M.D.  Fax: [phone]   Tasia Catchings, M.D.  Fax: [phone]   Edwena Felty. Romine, M.D.  Fax: [phone]   Lyn Records, M.D.  Fax: [phone]

## 2011-02-19 NOTE — H&P (Signed)
[name], [name]              ACCOUNT NO.:  [id number]   MEDICAL RECORD NO.:  [id number]          PATIENT TYPE:  OBV   LOCATION:  0107                         FACILITY:  [facility]   PHYSICIAN:  Hollice Espy, M.D.DATE OF BIRTH:  [birth year]   DATE OF ADMISSION:  04/21/2008  DATE OF DISCHARGE:                              HISTORY & PHYSICAL   PRIORITY ADMISSION HISTORY AND PHYSICAL   PRIMARY CARE PHYSICIAN:  Tasia Catchings, MD.   CHIEF COMPLAINT:  Syncope.   HISTORY OF PRESENT ILLNESS:  The patient is a 77 year old white female  with a past medical history of hypertension as well as a history of  colon cancer, who just underwent a resection of her large intestine and  part of her small intestine leaving her with an ileostomy done less than  a month ago.  Since that time, she says that she has been recuperating  at home, her appetite is very poor, and she does take IV fluids every  night but continues to feel weaker and weaker.  Today, she had an  episode where she passed out.  She does not remember any of it.  She  says she had no bowel or bladder.  Her family did not witness the  initial event, but afterwards the patient was brought into the emergency  room for further evaluation.  Paramedics were called.  A CBG was  negative at 154.  The patient was approximately out for, she thinks,  about an hour and a half.  A CT scan of the head shows microvascular  changes in the white matter, no acute focal intracranial abnormality, no  fracture or lesions of the calvarium, and a chest x-ray noted her venous  catheter in place, but no evidence of any pneumothorax or acute process.  Labs were ordered on the patient.  She has an abnormal white count and a  normal shift but what was concerning was her electrolytes with a calcium  of 6.5, a magnesium was 0.4, and a potassium of 3.1.  Cardiac markers  noted an increased CPK of greater than 500 consistent with her fall, but  her MB and  troponins were normal.  EKG noted a normal sinus rhythm and  no evidence of any arrhythmias.  The patient is currently feeling  better, although she feels again continued weak and she has a mild bump  on her head where she hit the back of her head.  Otherwise, she denies  any vision changes, dysphagia, chest pain, palpitations, shortness of  breath, wheezing, coughing, abdominal pain, hematuria, dysuria,  constipation, diarrhea, focal extremity numbness, weakness, or pain, and  her ostomy has a high output, which is typical.  She says anything she  drinks or eats usually goes right through.  Review of systems is  otherwise negative.   PAST MEDICAL HISTORY:  Includes:  1. History of abdominal carcinomatosis status post large and partial      small intestine removal.  2. History of hypertension.  3. GERD.   MEDICATIONS:  The patient is on aspirin 81, Nexium 40, Coreg, Ativan,  Phenergan.  She has no known drug allergies.   SOCIAL HISTORY:  No tobacco, alcohol, or drug use.   FAMILY HISTORY:  Noncontributory.   PHYSICAL EXAMINATION:  VITAL SIGNS ON ADMISSION:  Temp 98.4, heart rate  89, blood pressure 161/74, respirations 18, and blood pressure had  subsequently come down to 150/57 with O2 SAT 98% on room air.  GENERAL:  She is alert and oriented x3 in no apparent distress.  HEENT:  Normocephalic, atraumatic, her mucous membranes are moist.  NECK:  She has no carotid bruits.  HEART:  Regular rate and rhythm, S1 S2.  LUNGS:  Clear to auscultation bilaterally.  ABDOMEN:  Soft.  Her stoma looks pink with a large amount of output.  EXTREMITIES:  No clubbing, cyanosis, or edema.   LABORATORY DATA:  White count 8.9, H&H 11.7 and 35, MCV of 86, platelet  count 274, and no shift.  UA notes small leukocyte-esterase, but only 3  to 6 white cells, no bacteria.  CPK greater than 500, MB 2.2, troponin-I  less than 0.05.  Sodium 142, potassium 3.1, chloride 106, BUN 11,  creatinine 1,  glucose 109, magnesium 0.4, calcium 6.5.   ASSESSMENT AND PLAN:  1. Syncope.  Question arrhythmia secondary to decreased magnesium in      terms of ventricular tachycardia and torsades all versus could be      simple dehydration from poor absorption.  We will get a nutrition      consult.  We will replace magnesium and potassium and continue to      follow electrolytes.  2. Weakness.  Physical Therapy and Occupational Therapy to see.  3. Hypertension.  We will get her dose of medications and continue.      Hollice Espy, M.D.  Electronically Signed     SKK/MEDQ  D:  04/21/2008  T:  04/21/2008  Job:  [id number]   cc:   Tasia Catchings, M.D.  Fax: [phone]

## 2011-02-19 NOTE — Consult Note (Signed)
[name], KNUDSEN NO.:  [id number]   MEDICAL RECORD NO.:  [id number]          PATIENT TYPE:  INP   LOCATION:  1501                         FACILITY:  [facility]   PHYSICIAN:  Tasia Catchings, M.D.   DATE OF BIRTH:  26-Jan-1934   DATE OF CONSULTATION:  03/15/2008  DATE OF DISCHARGE:                                 CONSULTATION   REFERRING PHYSICIAN:  Currie Paris, M.D.   HISTORY OF PRESENT ILLNESS:  [name] is a 77 year old female who I  have known for a long time.  She recently presented with an ulcerated  lesion in her cecum that appeared to be growing in from the outside and  producing anemia.  Biopsies of this lesion from the inside were benign.  However, about 6 weeks ago on attempted laparoscopy to remove the right  colon resulted in the findings of multiple peritoneal implants as well  as serosal surface implants.  That biopsy revealed a serous carcinoma of  uncertain origin that contained psammoma bodies.  It subsequently turned  out that she had this type of tumor 25 or 35 years ago, whenever it was  that she ended up having a hysterectomy.  So, the source may have been  her ovary.  In any case her postoperative course following her  laparoscopy revealed a perforation and abscess; and she underwent,  several weeks ago, a laparotomy where she had an extensive small-bowel  resection, 123 cm possibly, of her distal small bowel and her ascending  colon.  She ended up with an ileostomy and a mucous fistula.   She recovered from that gradually, and actually went home only to return  to the hospital 5 days later with dehydration presumably due to the high  output from her short-bowel syndrome, perhaps coupled by the  hydrochlorothiazide in her blood pressure medicine.  She is now admitted  for further rehydration and treatment.  She has a number of other  medical problems including hypertension which had been treated with  atenolol 100 mg daily, Diovan  HCT 320/25, and Caduet 5/20 which contains  5 mg of amlodipine.  Her cholesterol was treated with the 20 mg of  Lipitor in the Caduet.  She has had atypical chest pain and had a  cardiac catheterization in 2007 which was normal, but presented with her  anemia with increasing atypical chest pain.  Noninvasive tests leading  up to her anemia discovery were still negative.   She has had a history of a laparoscopic cholecystectomy about 20 years  ago.   She has GERD and has been on Nexium for that.   Intermittent bronchitis has occurred.   She has had hormone replacement therapy in the past, but was tapered off  by her gynecologist and is now just on calcium.  She has a negative bone  study.   PAST MEDICAL HISTORY/ALLERGIES:  ACE INHIBITORS.   SMOKING:  None.   ALCOHOL:  Rare glass of wine.   CAFFEINE:  Rare cola.   PREVIOUS SURGERY INCLUDES:  1. The cholecystectomy.  2. TAH/BSO.  3. Several benign breast biopsies.  4. The laparoscopy followed by the laparotomy with small-bowel and      ascending colon resection, ileostomy, and mucous fistula.   FAMILY HISTORY:  Father died at age 68 of some type of heart disease.  Mother died at age 54 of severe DJD, ASCBD, and ASCVD, and ASPVD, along  with heart disease.  One brother died with diabetes, hypertension, and  ASHD.  Two sisters, one died in an automobile accident; and the other is  alive, but has had a history of breast cancer.  One child, is an adopted  daughter.   SOCIAL HISTORY:  Native of [place] who grew up living all over [place]  [place] because her father was in the [employer].  She has lived in  [place] since 1987, married since 1973, her husband is retired from  [employer] and she works part-time for [employer].   PHYSICAL EXAMINATION:  Unremarkable except for her ostomies.  She had no  peripheral edema, and her vital signs were normal.   IMPRESSION:  1. Dehydration secondary to both short-bowel syndrome with  obligatory      loss there and the hydrochlorothiazide perhaps in her blood      pressure medications.  2. This short-bowel syndrome may have also led to mild hypokalemia,      hypomagnesemia, and ultimately to low vitamin B12 level.   PLAN:  The patient is placed on IV fluids with replacement quantities,  in part, calculated from output.  I am also starting her on a PPI.  I am  leaving her off her blood pressure medicines; and, if necessary, I will  replace her magnesium.  She will receive parenteral vitamin B12 for the  rest of her life.   From the dietary standpoint I am going to try to use Peptamen, and after  consultation with the dietician, we will use it as a supplement instead  of by itself, and hope that a regular diet plus the supplement provides  her enough nutrition and that we can keep up with her output.  She will  be ultimately improved once she is reanastomosed to her colon.      Tasia Catchings, M.D.  Electronically Signed     JW/MEDQ  D:  03/16/2008  T:  03/16/2008  Job:  [id number]   cc:   Currie Paris, M.D.  [address]  [place]  [place] [address]   Leighton Roach. Truett Perna, M.D.  Fax: [phone]
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
## 2011-02-19 NOTE — H&P (Signed)
[name], [name]              ACCOUNT NO.:  [id number]   MEDICAL RECORD NO.:  [id number]          PATIENT TYPE:  INP   LOCATION:  1524                         FACILITY:  [facility]   PHYSICIAN:  Currie Paris, M.D.DATE OF BIRTH:  18-Dec-1933   DATE OF ADMISSION:  02/19/2008  DATE OF DISCHARGE:  03/07/2008                              HISTORY & PHYSICAL   CHIEF COMPLAINT:  Severe abdominal pain.   HISTORY OF PRESENT ILLNESS:  [name] is a 77 year old lady who  underwent a diagnostic laparoscopy three days prior to this admission.  Finding what appeared to be abdominal carcinomatosis was made and  biopsies taken.  At that time it was elected not to do further surgical  intervention until biopsies could be reviewed and further evaluation  done.   She was discharged the next morning feeling okay and readmitted out of  the oncology office (Dr. Truett Perna) with severe abdominal pain, weakness,  volume depletion.   The patient had had increasing abdominal pain since her discharge.  She  had gotten weaker and weaker.  She was noted to be diaphoretic.  CT scan  was done just prior to admission showing what appeared to be  extraluminal air and primarily left retroperitoneal.   PHYSICAL EXAMINATION:  GENERAL:  On admission showed an elderly weak but  alert and oriented, quite uncomfortable patient.  HEENT:  Was noted to show marked dryness of mucous membranes.  LUNGS:  Clear.  HEART:  Regular with tachycardia about 120.  ABDOMEN:  Diffusely tender and rigid, no bowel sounds, wounds were,  otherwise, healing nicely.  EXTREMITIES:  No cyanosis or edema noted.   IMPRESSION:  Acute abdomen likely secondary to perforation.   PLAN:  Admission for IV fluids, hydration, antibiotics, and urgent  exploratory laparotomy.      Currie Paris, M.D.  Electronically Signed     CJS/MEDQ  D:  03/16/2008  T:  03/16/2008  Job:  [id number]

## 2011-02-19 NOTE — Op Note (Signed)
[name], [name]              ACCOUNT NO.:  [id number]   MEDICAL RECORD NO.:  [id number]          PATIENT TYPE:  OIB   LOCATION:  1235                         FACILITY:  [facility]   PHYSICIAN:  Currie Paris, M.D.DATE OF BIRTH:  12/04/1933   DATE OF PROCEDURE:  02/19/2008  DATE OF DISCHARGE:                               OPERATIVE REPORT   PREOPERATIVE DIAGNOSES:  1. Probable intestinal perforation with retroperitoneal abscess.  2. 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   POSTOPERATIVE DIAGNOSES:  1. Apparent intestinal perforation with retroperitoneal and left lower      quadrant contamination; abdominal carcinoma with mass effect in the      ascending colon and apparent involvement of omentum and      peritoneum.  2. Extensive abdominal adhesions.   OPERATION:  Exploratory laparotomy with extensive adhesiolysis (1-1/2  hours); resection of terminal ileum, ascending and transverse colon with  creation of ileostomy and mucous fistula.   SURGEON:  Dr. Jamey Ripa.   ASSISTANT:  Dr. Derrell Lolling.   ANESTHESIA:  General endotracheal.   CLINICAL HISTORY:  [name] is a 77 year old lady who presented about  a month ago with anemia, and workup showed what appeared to be a cecal  tumor.  On laparoscopy four days ago, there was finding made of what  appeared to be omental involvement with tumor as well as studding of the  peritoneum and extensive adhesions preventing full exploration through a  scope.  Biopsies were taken.  It was elected to not do anything further  until biopsy reports were finalized and plans for most likely an  extensive debulking procedure were able to be accomplished later.  The  patient appeared to tolerate the procedure well and go home the next  day.  She had ongoing pain and was admitted from Dr. Kalman Drape office  today after CT scan showed what appeared to be a large collection of air  in the left retroperitoneum which is where  she was having most of her  pain.  There was no evidence of free peritoneal fluid suggesting intra-  abdominal perforation.  The patient appeared septic, and after  discussion with the patient and family, it was elected to proceed to  urgent exploration.  They understood that we might find a perforation,  and she might have to have an ostomy.   DESCRIPTION OF PROCEDURE:  The patient was taken to the operating room.  After satisfactory general endotracheal anesthesia had obtained, Foley  catheter placed and the abdomen prepped and draped.  The time-out was  done.   Initially, I attempted a laparoscopy, and I opened the epigastric  incision and was able to get a little free fluid out that looked clear.  However, on digital examination, some feculent material came out, and I  then converted to an open laparotomy.   I made a midline incision from mid hypogastrium to the mid epigastrium.  There was some significant fecal contamination on the leftlaterally and  into the left flank area which is where all of her pain was.  However,  there was significant matting of the omentum.  The transverse colon  appeared to be at this level, and there was really no free space to  investigate what was happening.  I extended the incision further,  eventually getting almost to the symphysis and up to the xiphoid.  Upon  doing that, it was further clear that there multiple loops of distal  small bowel stuck to what appeared to be matted thickened omentum.  The  transverse colon actually hung down fairly low, and there appeared to be  actually small bowel somehow above it which it was not clear how that  had developed, whether there has been a rent in the mesentery remotely.  That did not appear to be acute.   There was some fecal contamination over the surface of the omentum.  There was a palpable mass in the vicinity of the ascending colon, but  this was all covered with matted small bowel on top of it.   The small  bowel was also matted down into the pelvis to the right side of the  rectum and down into where the adnexa would have been, although she had  had a prior hysterectomy.   We then began a tediously adhesiolysis.  I was able to divide some of  fatty tissue here and there and free up little bits of bowel.  Once I  got a window open, I was then able to find the ligament of Treitz, and  we found that the first approximately 6-8 feet of bowel was just  slightly distended but uninvolved with either gross tumor or  inflammatory process other than one little bit of exudate where  apparently walled off something of an abscess cavity in the left lower  quadrant.  Once I had that freed up, I then identified the stomach and  the liver which again were fairly stuck in adhesions, and she had a  prior right subcostal incision.  I then tried to free up in the pelvis  and found the rectum, freed up some loops of bowel from that, but again  most of terminal ileum was matted together with dense adhesions and  areas that appeared to have tiny nodules of tumor, although without  pathological confirmation, this could have been some sort of chronic  inflammatory process.   All of the acute inflammatory processes seemed to be confined to the  left lower quadrant, and it was my suspicion that there had been a left  lower quadrant trocar placed and that we had gotten into a preperitoneal  plane and dissected up some of the peritoneum away, and either an  adhesion there had torn and leaked some enteric content into the  retroperitoneum.  We never really found a specific hole either in the  colon or the small bowel to account for all of this, but again it was  all matted and inflamed over on the left side.   At this point, I thought that my best point of attack would be to go  ahead and resect the main body of the tumor in the right colon and in  doing so try to free up the small bowel such that we  could find where  the actual perforation had occurred.  By this time, we had the  transverse colon hanging down.  It was really separated from the stomach  with the omentum draped down from there over the small bowel.  I traced  this back up proximally and then was able to incise along what would be  the white line of Toldt laterally and try to mobilize the cecum and the  ascending colon medially.  Once I had that freed up, I then tried again  to get the small bowel freed up out of the pelvis, and again there were  several loops of matted together down into the pelvis, and in freeing  these up, we did cause a couple of small bowel injuries and  perforations.  However, these were not related to whatever the injury  was or the cause of the retroperitoneal process, but instead due to the  dense chronic inflammatory (or malignant) process. There was no plane of  dissection that could be established to get these loops of small bowel  separated from each other or from the omentum, which was draped onto  them.   I continued working and finally got all of the matted loops of small  bowel out of the pelvis and off of the right lateral aspect of the  rectum.  Then, in assessing all of this and in consultation with my  assistant, Dr. Derrell Lolling, it appeared to [place] that the best course of action  was to resect the transverse colon and ascending colon and the distal  ileum where it was all matted together and had been involved both in  what appeared to be tumor nodules and inflammatory process, and we were  going to have to resect a couple of loops regardless.  We thought it  would be better to have all of that area that was damaged completely  resected and do an ileostomy and mucous fistula.   At  this point, I divided the colon in the distal transverse segment.  I  divided the colon mesentery with the smaller vessels being divided with  the LigaSure, larger vessels suture ligatured and double tied.  I  came  around the hepatic flexure, and as I mobilized it up, I got a good look  at the duodenum and stayed well away from that and close to the bowel.  I continued inferiorly until I had down to about the mid ascending  colon.  I then went back and freed up as much of the jejunum and  proximal ileum as I could so that I was as distal as possible before  resecting any bowel, and once I got to the first area where there was  involvement, I divided the small bowel mesentery with a GIA.  I then  divided the intervening mesentery to the terminal ileum and ascending  colon and took this out as an en bloc resection.   We then spent a large amount of time and about 15 liters of irrigation,  irrigating and making sure everything was dry.  Identified both ureters  to make sure there was no entries to those.  I closely inspected the  rectum and sigmoid which was fairly loopy, descending colon and distal  transverse to make sure there no injuries or other inflammatory  processes involving any of that.  I then ran the small bowel, inspecting  it carefully to make sure there were no missed areas, and other than  some edema from the inflammatory process and a question of multiple tiny  white nodules that might have represented tumor, no other problems were  noted.   I made a circular incision in the right midabdomen and incised the  fascia and brought the distal ileum out for an ileostomy bringing out  several centimeters, so I could be mature this to  produce a spout.  I  then made a similar incision in the left upper quadrant and brought the  proximal end of the distal transverse colon out for mucous fistula.  Once these were done, we irrigated some more.  Everything appeared to be  dry.  I put two 19 Blake drains in, one along the left retroperitoneum  and the other into the pelvis to try to diminish fluid collections.  I  then secured those with some 2-0 nylons.   The abdomen was closed with a  running #1 looped PDS with six #5 sutures  used for retentions and plastic bridges used for those.  The ostomies  were matured with 3-0 chromic.  Sterile gauze dressing with some mild  Betadine was placed in the wound.  Sterile dressings were applied.   The patient received 2 units of blood during the procedure.  She  remained reasonably stable throughout.  Anesthesia did place an A line  and central line.   At the conclusion of the procedure, I did spend about 30 minutes going  over the operative findings with the patient's family and her overall  situation.  I explained that while I did not find a clear-cut site of a  perforation most of the process appeared to be in the left lower  quadrant where the left lower quadrant trocar was, and I thought that  either  the trocar caused some injury or that the abdominal distention had  caused adherent adhesion to cause a perforation and that for whatever  reason seemed to go out through the trocar site.  I think all questions  were answered regarding the operative findings.      Currie Paris, M.D.  Electronically Signed     CJS/MEDQ  D:  02/19/2008  T:  02/19/2008  Job:  [id number]   cc:   Leighton Roach. Truett Perna, M.D.  Fax: [phone]   Tasia Catchings, M.D.  Fax: [phone]   Edwena Felty. Romine, M.D.  Fax: [phone]

## 2011-02-19 NOTE — Discharge Summary (Signed)
NAMEMAKINZIE, CONSIDINE              ACCOUNT NO.:  [id number]   MEDICAL RECORD NO.:  [id number]          PATIENT TYPE:  OBV   LOCATION:  1418                         FACILITY:  [facility]   PHYSICIAN:  Tasia Catchings, M.D.   DATE OF BIRTH:  04/06/1934   DATE OF ADMISSION:  04/21/2008  DATE OF DISCHARGE:  04/25/2008                               DISCHARGE SUMMARY   DISCHARGE DIAGNOSES:  1. Syncope, possibly due to arrhythmia secondary to both      hypomagnesemia and hypokalemia.  2. Hypomagnesemia and hypokalemia.  3. Brief run of supraventricular tachycardia.  4. Psammatous peritoneal tumors, status post partial small bowel      resection as well as right hemicolectomy with ileostomy and mucous      fistula producing at least temporarily short bowel syndrome which      is the cause of her hypomagnesemia and hypokalemia.  5. Hypertension.  6. Gastroesophageal reflux disease.  7. Irritable bowel syndrome.   DISCHARGE MEDICATIONS:  1. K-Dur 20 mEq daily.  2. Coreg 25 b.i.d.  3. Nexium 40 mg daily.  4. Diovan 320 mg daily.  5. Caduet 5/20 daily.  6. Folic acid 1 mg daily.  7. ASA 81 mg daily.  8. Vitamin D 50,000 units every other week.  9. She also received 1200 mL of normal saline plus 20 mEq of potassium      and 250 mg of mag sulfate nightly through a PICC line.   BRIEF HISTORY:  Ms. Ginty is a 77 year old female who developed  bleeding from a tumor of her ascending colon that was actually growing  from the outside in.  After an attempt at an elective laparoscopic  resection of her right colon, it was discovered at that time that she  had psammatous peritoneal tumors throughout her peritoneum and the  laparoscopic resection was abandoned.  Unfortunately, within 48 hours  she developed peritonitis probably from the microperforation and ended  up with an open resection where 100 cm of small bowel and all of her  right colon were removed, leaving her with ileostomy and mucous  fistula.  She did well after we were able to rehydrate her and she was at home on  IV fluids at night but gradually developed hypomagnesemia which was  recognized really about 12 hours before her hospitalization.  Before she  could get back into our office to receive potassium, she passed out; the  details of which are on her history and physical, but it was unobserved  and we are not sure how long she was out for.   In any case, she was transported by ambulance and awakened in the  ambulance coming to the hospital.  She had a little small hematoma on  the back of her head but otherwise, despite having a hole in the wall of  her bathroom, did not injure anything else significantly.   PHYSICAL EXAM AT THE TIME OF ADMISSION:  She was alert and oriented.  Vital signs were normal and except for the signs of recent surgery on  her abdomen and the lump on the back of  her head, the physical exam was  negative.   HOSPITAL COURSE:  1. Syncope.  The patient was placed in telemetry and except for one      brief episode of either SVT or possibly V-tach, she had no ectopy      and this occurred without her being aware of it.   She had a CT scan of her head which showed no trauma and no subdural  hematoma.  She had no further syncope.   1. Hypomagnesemia and hypokalemia.  Patient's potassium was initially      3.1 and her magnesium was 0.5.  She received IV potassium, oral      potassium, IV magnesium, and oral magnesium and all of these things      corrected.  Her appetite returned and she felt better almost      immediately.  She will be sent home on supplemental potassium      orally as well as supplemental magnesium and potassium in her IV      fluids at night.   The patients other problems were not addressed during this  hospitalization.      Tasia Catchings, M.D.  Electronically Signed     JW/MEDQ  D:  04/24/2008  T:  04/24/2008  Job:  [id number]   cc:   Currie Paris, M.D.   [address], [address]  [place]  [place] [address]

## 2011-02-19 NOTE — Consult Note (Signed)
[name], [name]              ACCOUNT NO.:  [id number]   MEDICAL RECORD NO.:  [id number]          PATIENT TYPE:  OBV   LOCATION:  1418                         FACILITY:  [facility]   PHYSICIAN:  Ollen Gross. Vernell Morgans, M.D. DATE OF BIRTH:  17-Mar-1934   DATE OF CONSULTATION:  04/22/2008  DATE OF DISCHARGE:                                 CONSULTATION   REFERRING PHYSICIAN:  Tasia Catchings, M.D.   We were asked to see [name] in consultation with Dr. Tasia Catchings  to evaluate her for an abdominal wound.   CHIEF COMPLAINT:  Syncope.   [name] is a 77 year old white female who has a history of abdominal  carcinomatosis.  She underwent abdominal surgery a little over a month  ago where part of her colon and small bowel were removed.  She now has  an ileostomy and a mucous fistula.  She has also had a small midline  wound that has been managed with a VAC.  She has had problems with p.o.  intake and absorption.  She has been at home tolerating a diet and does  get some fluids at night but over the last few days has felt some  general malaise and jitteriness.  She then passed out last night.  This  was unwitnessed by her family but EMS was called.  She was brought to  the emergency department.  At that time she was found to have some  significant electrolyte abnormalities and signs of dehydration.  She was  resuscitated and feels better now.  Her white count currently is normal.  She is denying any fever or abdominal pain.  She is tolerating a diet a  little bit better now.   PAST MEDICAL HISTORY:  1. Significant for abdominal carcinomatosis.  2. Hypertension.  3. Gastroesophageal reflux.   PAST SURGICAL HISTORY:  Significant for abdominal exploration with  mucous fistula and ileostomy and partial resection of the small and  large bowel.   MEDICATIONS:  Aspirin, Nexium, Ativan, Phenergan and Coreg.   ALLERGIES:  No known drug allergies.   SOCIAL HISTORY:  She denies use of  alcohol or tobacco products.   FAMILY HISTORY:  Noncontributory.   PHYSICAL EXAMINATION:  Temperature is 98.3, blood pressure 173/69, pulse  77.  GENERAL:  She is a well-developed, well-nourished white female in no  acute distress.  SKIN:  Warm and dry, no jaundice.  EYES:  Her extraocular movements are intact.  Pupils equal, round and  reactive to light.  Sclerae are nonicteric.  LUNGS:  Clear bilaterally.  No use of accessory respiratory muscles.  HEART:  Regular rate and rhythm with an impulse in the left chest.  ABDOMEN:  Soft and nontender.  Her midline wound is well-healed.  She  has a VAC over a small open wound and the drainage from this appears to  be clear.  She has a mucous fistula and an ileostomy that are healthy-  appearing and productive.  EXTREMITIES:  No cyanosis, clubbing or edema.  Good strength in her arms  and legs.  PSYCHOLOGICAL:  She is alert and oriented  x3 with no evidence of anxiety  or depression.   Her white count today is normal.  Magnesium and potassium are improved.   ASSESSMENT AND PLAN:  This is a 77 year old white female with a history  of abdominal carcinomatosis and ileostomy, who has had generalized  malaise and passed out yesterday.  This may be secondary to dehydration  and malnutrition.  I would agree with her admission to the hospital,  fluid resuscitation and electrolyte correction.  Her VAC was just  changed but the drainage does not appear to be worrisome.  We will  reevaluate the wound when the [facility] is changed again but at this point her  abdomen is fairly benign-looking.  We will continue to follow her  closely with you and we will discuss with Dr. Jamey Ripa on Monday.      Ollen Gross. Vernell Morgans, M.D.  Electronically Signed     PST/MEDQ  D:  04/22/2008  T:  04/22/2008  Job:  [id number]
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
## 2011-02-19 NOTE — Op Note (Signed)
Powell, Alison              ACCOUNT NO.:  [id number]   MEDICAL RECORD NO.:  [id number]          PATIENT TYPE:  INP   LOCATION:  0002                         FACILITY:  [facility]   PHYSICIAN:  Currie Paris, M.D.DATE OF BIRTH:  1934/05/29   DATE OF PROCEDURE:  02/16/2008  DATE OF DISCHARGE:                               OPERATIVE REPORT   OFFICE MEDICAL RECORD NUMBER #[id number].   PREOPERATIVE DIAGNOSIS:  Carcinoma of the ascending colon.   POSTOPERATIVE DIAGNOSES:  Abdominal carcinomatosis, primary uncertain,  but likely gynecologic primary.   OPERATION:  Diagnostic laparoscopy with peritoneal and omental biopsies  and peritoneal washings.   SURGEON:  Currie Paris, M.D.   ASSISTANT:  Lennie Muckle, M.D.   ANESTHESIA:  General anesthesia.   INDICATIONS FOR PROCEDURE:  [name] is a 77 year old lady who  recently was found to have a fairly rapid onset anemia.  Evaluation  showed a lesion in the ascending colon that was biopsied and no cancer  was found.  It was suspicious for cancer by general appearance.  An  abdominal CT scan showed no mass but a little bit of questionable lymph  node in the paraesophageal region and a questionable little bit of  thickening of the splenic flexure.  After a discussion with the patient,  we decided to proceed to a right colectomy and thought we would try this  as a laparoscopic.  She did have a right upper quadrant incision from an  open cholecystectomy and a lower midline incision from her hysterectomy.   DESCRIPTION OF PROCEDURE:  The patient was seen in the holding area and  she had no other questions.  She was taken to the operating room.  After  satisfactory general anesthesia had been obtained, a catheter was placed  and the abdomen prepped and draped.  A time out was done.   I started by making a short supraumbilical incision and identified and  opened the fascia.  I had a little difficulty initially trying to get a  plane into the abdominal cavity at that time but with gentle dissection  I was able to get in.  When I put my finger in, I could feel what felt  like peritoneal implants on the peritoneal lining.  I went ahead and put  a pursestring in and inserted the  Hasson.  Upon doing so, I saw a  little clear fluid.  I saw multiple areas of whitish apparent tumor on  what appeared to be the omentum and on the peritoneal surface.  Exposure  was difficult because everything seemed to be matted down from prior  surgery and also from tumor.  I went ahead and put a 5 mm trocar into  the left lower quadrant under direct vision.  I was able to find one of  these small areas and get a small biopsy and sent that.   While waiting for that to be done, I looked around and found another  area to biopsy and separately sent that.  After this tumor was looked  at, this was thought to represent a GYN pathology.  There were some  psmomma bodies present.  This did not appear to be resectable for cure  at this point, and I thought she was probably going to need a more  extensive workup and a gynecological procedure, probably a debulking.  She has had a hysterectomy, and I thought that her ovaries had been  removed as well, but this could well be a primary peritoneal tumor as  well.   At this point, I took a few more biopsies because pathology requested a  few for permanent section.  We also did peritoneal washings and sent  that fluid.  We made a final check and there was no bleeding.  The  abdomen was deflated.  The umbilical port was closed with the  pursestring and the skin closed with #4-0 Monocryl subcuticular and  Dermabond.   The patient tolerated the procedure well.  There were no complications.  There was minimal blood loss.      Currie Paris, M.D.  Electronically Signed     CJS/MEDQ  D:  02/16/2008  T:  02/16/2008  Job:  [id number]   cc:   Tasia Catchings, M.D.  Fax: [phone]   Altamese Dilling, M.D.

## 2011-02-19 NOTE — Discharge Summary (Signed)
NAMEJERYN, [name]              ACCOUNT NO.:  [id number]   MEDICAL RECORD NO.:  [id number]          PATIENT TYPE:  INP   LOCATION:  1501                         FACILITY:  [facility]   PHYSICIAN:  Currie Paris, M.D.DATE OF BIRTH:  August 04, 1934   DATE OF ADMISSION:  03/14/2008  DATE OF DISCHARGE:  03/19/2008                               DISCHARGE SUMMARY   FINAL DIAGNOSES:  1. Volume depletion.  2. Acute renal insufficiency secondary to #1.  3. Short bowel syndrome.   MEDICAL HISTORY:  [name] was recently discharged from hospital  following laparotomy with small bowel and colon resection and creation  of an ostomy.  She had developed increasing ileostomy output and  presented to the emergency department with weakness and dizziness.  Admission laboratory studies suggested a significant volume depletion.   HOSPITAL COURSE:  The patient was admitted and begun on IV fluids.  We  tried her on some Imodium and Gatorade as well as some enteral diet  supplements.   Within 24 hours, she was feeling much better.  Her urine output had  improved and her serum creatinine began to return to normal.  She was  seen in consultation by Dr. Shari Heritage, and we made some adjustments  of some other medications, including repleting her magnesium and  checking B12 levels, etc.  She was able to tolerate a solid diet, and we  thought she was maintaining nutrition, but was unable to maintain  hydration because of the ostomy output.  In addition, we managed her  open wound with debridements locally.   She had a PICC line placed so she could have IV fluids at night after  discharge, and by 06/12 was able to be discharged.   LABORATORY DATA:  Laboratory studies this admission included initial  hemoglobin of 10.8, which drifted to 9.8 with IV fluids.  Her initial  creatinine was 5.2 with a BUN of 40, and this came down to a creatinine  of 0.96 and a BUN of 9 by discharge.  Her albumin was noted  be low at  2.7.  Her magnesium was slightly low with 1.4, and she was given some  extra but was still close to 1.2 at discharge.  Her B12 level was high  at 1145.  Her vitamin D level was low at 14.  Prealbumin was noted at  23.7.   At discharge, she is to follow up at both my office and by Dr. Sherin Quarry.  We will try to maintain IV fluids, etc. to maintain her fluid balance.      Currie Paris, M.D.  Electronically Signed     CJS/MEDQ  D:  05/10/2008  T:  05/10/2008  Job:  [id number]   cc:   Tasia Catchings, M.D.  Fax: [phone]

## 2011-02-22 NOTE — Op Note (Signed)
[place]  Patient:    [name], [name] Visit Number: [id number] MRN: [id number]          Service Type: DSU Location: [facility] Attending Physician:  Janalyn Rouse Dictated by:   Rose Phi. Maple Hudson, M.D. Proc. Date: 02/10/02 Admit Date:  02/10/2002   CC:         Jeralyn Ruths, M.D.  Genene Churn. Sherin Quarry, M.D.  Cynthia P. Ashley Royalty, M.D.   Operative Report  PREOPERATIVE DIAGNOSIS:  Dominant mass of the right breast.  POSTOPERATIVE DIAGNOSIS:  Dominant mass of the right breast.  OPERATION PERFORMED:  Excision of right breast mass.  SURGEON:  Rose Phi. Maple Hudson, M.D.  ANESTHESIA:  MAC.  DESCRIPTION OF PROCEDURE:  This patient had presented for mammography with a palpable mass.  She had a mass and a fine needle aspiration showed some atypical cells and it was recommended to her that she either have a core biopsy done or have excision.  She wanted to have an excision and I saw her regarding this.  Today we are doing that.  The patient was placed on the operating table with the right arm extended on the arm board.  The right breast was prepped and draped in the usual sterile fashion.  Palpable mass at the 8 oclock position was outlined with a marking pencil.  A curvilinear incision was then outlined.  The area thoroughly infiltrated with 1% Xylocaine with Adrenalin.  The incision was made and the mass excised.  There was a lot of dense fibrocystic disease there with several large cysts which we punctured and partially removed.  Specimen was then submitted to the pathologist for permanent pathology.  Hemostasis obtained with a cautery.  Subcuticular closure with 4-0 Monocryl and Steri-Strips carried out.  Dressing applied.  The patient was transferred to the recovery room in satisfactory condition having tolerated the procedure well. Dictated by:   Rose Phi. Maple Hudson, M.D. Attending Physician:  Janalyn Rouse DD:  02/10/02 TD:  02/11/02 Job:  [id number]

## 2011-02-22 NOTE — Cardiovascular Report (Signed)
NAMENELLE, SAYED NO.:  [id number]   MEDICAL RECORD NO.:  [id number]          PATIENT TYPE:  OIB   LOCATION:  1966                         FACILITY:  [facility]   PHYSICIAN:  Lyn Records, M.D.   DATE OF BIRTH:  [birth year]   DATE OF PROCEDURE:  11/11/2005  DATE OF DISCHARGE:                              CARDIAC CATHETERIZATION   INDICATIONS:  The patient has been having exertional dyspnea and difficulty  with chest pressure on exertion.  He recently had a markedly abnormal stress  test that had diffuse ST-segment changes consistent with ischemia on a  normal perfusion study. The study is being done to rule out mass, severe  three-vessel coronary disease.   PROCEDURE PERFORMED:  1.  Left heart cath.  2.  Selective coronary angiography.  3.  Left ventriculography.  4.  Abdominal aortography in this patient with severe hypertension, rule out      renal artery stenosis.   DESCRIPTION:  After informed consent, a 4-French sheath was placed using 1%  Xylocaine local anesthesia and a modified Seldinger technique. 4-French A2  multipurpose was then used for hemodynamic recordings, left ventriculography  by hand injection, selective right coronary angiography. A 3.54 French left  Judkins catheter was used for left coronary angiography and an angled  pigtail was used for abdominal aortography to rule out renal artery  stenosis. No complications occurred and the procedure was terminated.  Sheath removed and manual compression held with good hemostasis.   RESULTS:  1.  Hemodynamic data:      1.  Aortic pressure 193/73.      2.  Left ventricular pressure of 190/27.  2.  Left ventriculography: The left ventricle cavity is small contractility      is hyperdynamic EF is 65-75%. No obvious MR is noted. Ectopy is noted      during the injection that interferes with assessment of regional wall      motion.  3.  Coronary angiography.      1.  Left main coronary: Arises  superiorly from the left sinus of          Valsalva. No significant left main obstruction is noted.      2.  Left anterior descending coronary: The left anterior descending          gives origin to two diagonal branches. After the second diagonal          branch. The LAD terminates on the anterior wall at the apex. It is          now a wraparound LAD. The first diagonal is large and free of          obstruction. The second diagonal is also large,  actually the          largest of three diagonals and contains irregular 50-70% stenosis.          The LAD beyond the diagonal contains an eccentric 50-60% narrowing.      3.  Circumflex artery: The circumflex coronary artery is a large vessel  that trifurcates on the left lateral wall. It contains          irregularities but no high-grade obstruction.      4.  Right coronary: The right coronary artery is a large vessel that          gives origin a large PDA that wraps around left ventricular apex and          a left ventricular branch. Both branches are free of any significant          obstruction. There is eccentric 30-40% ostial PDA narrowing.  4.  Abdominal aortography: Single renal arteries are noted to be widely      patent bilaterally.   CONCLUSION:  1.  Moderate LAD disease after the second diagonal with 50% narrowing. There      is also 50% narrowing in the proximal portion of the large second      diagonal.  PDA contains 30-40% narrowing at its ostium. Irregularities      also noted in the mid RCA.  2.  Normal LV function.  3.  Widely patent renal arteries.  4.  Dyspnea likely due to the left ventricular diastolic dysfunction as      demonstrated by an elevated LV end-diastolic pressure of 27.   PLAN:  Aggressive medical therapy with more aggressive blood pressure  control. Initially the patient has 60-80% nap narrowing in the left internal  carotid and all of the patient's vascular disease warrants management with a  grade  L aggressive risk factor modification including lipid lowering and  aggressive blood pressure control.      Lyn Records, M.D.  Electronically Signed     HWS/MEDQ  D:  11/11/2005  T:  11/11/2005  Job:  [id number]   cc:   Tasia Catchings, M.D.  Fax: [phone]

## 2011-07-01 LAB — CROSSMATCH
ABO/RH(D): AB POS
Antibody Screen: NEGATIVE

## 2011-07-01 LAB — ABO/RH: ABO/RH(D): AB POS

## 2011-07-03 LAB — BLOOD GAS, ARTERIAL
Acid-base deficit: 3.3 — ABNORMAL HIGH
Acid-base deficit: 8.6 — ABNORMAL HIGH
Bicarbonate: 18.2 — ABNORMAL LOW
Bicarbonate: 23.9
Drawn by: 232811
FIO2: 0.4
FIO2: 0.5
MECHVT: 550
MECHVT: 550
MECHVT: 700
O2 Saturation: 90.8
O2 Saturation: 93.3
PEEP: 5
PEEP: 5
Patient temperature: 99.1
RATE: 12
RATE: 8
TCO2: 17.4
TCO2: 21.7
pCO2 arterial: 33.5 — ABNORMAL LOW
pCO2 arterial: 35.2
pCO2 arterial: 36.4
pCO2 arterial: 43.2
pH, Arterial: 7.317 — ABNORMAL LOW
pH, Arterial: 7.406 — ABNORMAL HIGH
pH, Arterial: 7.445 — ABNORMAL HIGH
pO2, Arterial: 73.8 — ABNORMAL LOW

## 2011-07-03 LAB — BASIC METABOLIC PANEL
BUN: 23
BUN: 27 — ABNORMAL HIGH
BUN: 36 — ABNORMAL HIGH
BUN: 57 — ABNORMAL HIGH
CO2: 18 — ABNORMAL LOW
CO2: 26
CO2: 27
CO2: 28
Calcium: 7 — ABNORMAL LOW
Calcium: 7.5 — ABNORMAL LOW
Calcium: 8 — ABNORMAL LOW
Calcium: 8.1 — ABNORMAL LOW
Calcium: 8.1 — ABNORMAL LOW
Calcium: 8.4
Calcium: 8.8
Chloride: 108
Creatinine, Ser: 0.7
Creatinine, Ser: 0.73
Creatinine, Ser: 0.77
Creatinine, Ser: 0.78
GFR calc Af Amer: >60
GFR calc Af Amer: >60
GFR calc Af Amer: >60
GFR calc Af Amer: >60
GFR calc non Af Amer: >60
GFR calc non Af Amer: >60
GFR calc non Af Amer: >60
Glucose, Bld: 119 — ABNORMAL HIGH
Glucose, Bld: 127 — ABNORMAL HIGH
Potassium: 3.6
Potassium: 4.2
Sodium: 133 — ABNORMAL LOW
Sodium: 137
Sodium: 140
Sodium: 142

## 2011-07-03 LAB — CARBOXYHEMOGLOBIN
Carboxyhemoglobin: 0.9
Carboxyhemoglobin: 1.8 — ABNORMAL HIGH
Carboxyhemoglobin: 1.9 — ABNORMAL HIGH
Carboxyhemoglobin: 2 — ABNORMAL HIGH
Carboxyhemoglobin: 2.2 — ABNORMAL HIGH
Methemoglobin: 1.1
Methemoglobin: 1.4
O2 Saturation: 56.8
O2 Saturation: 63.4
Total hemoglobin: 10.3 — ABNORMAL LOW
Total hemoglobin: 8.5 — ABNORMAL LOW
Total hemoglobin: 9 — ABNORMAL LOW

## 2011-07-03 LAB — COMPREHENSIVE METABOLIC PANEL
ALT: 14
ALT: 16
ALT: 18
ALT: 19
AST: 16
AST: 16
AST: 20
Albumin: 1.1 — ABNORMAL LOW
Albumin: 1.1 — ABNORMAL LOW
Albumin: <1 — ABNORMAL LOW
Albumin: <1 — ABNORMAL LOW
Alkaline Phosphatase: 141 — ABNORMAL HIGH
Alkaline Phosphatase: 43
Alkaline Phosphatase: 45
BUN: 14
BUN: 34 — ABNORMAL HIGH
CO2: 25
CO2: 27
CO2: 27
Calcium: 7.8 — ABNORMAL LOW
Calcium: 8.2 — ABNORMAL LOW
Calcium: 8.5
Chloride: 110
Creatinine, Ser: 0.66
Creatinine, Ser: 0.79
GFR calc Af Amer: >60
GFR calc Af Amer: >60
GFR calc Af Amer: >60
GFR calc non Af Amer: >60
GFR calc non Af Amer: >60
Glucose, Bld: 129 — ABNORMAL HIGH
Glucose, Bld: 136 — ABNORMAL HIGH
Glucose, Bld: 214 — ABNORMAL HIGH
Potassium: 3.2 — ABNORMAL LOW
Potassium: 3.3 — ABNORMAL LOW
Potassium: 4.8
Sodium: 130 — ABNORMAL LOW
Sodium: 135
Sodium: 140
Sodium: 144
Total Bilirubin: 1.1
Total Protein: 3 — ABNORMAL LOW
Total Protein: 3.5 — ABNORMAL LOW
Total Protein: 4.1 — ABNORMAL LOW
Total Protein: 4.3 — ABNORMAL LOW
Total Protein: <3 — ABNORMAL LOW

## 2011-07-03 LAB — MAGNESIUM
Magnesium: 1.4 — ABNORMAL LOW
Magnesium: 1.5
Magnesium: 1.7
Magnesium: 1.8
Magnesium: 1.9
Magnesium: 2.1

## 2011-07-03 LAB — CULTURE, ROUTINE-ABSCESS: Culture: NO GROWTH

## 2011-07-03 LAB — CULTURE, BAL-QUANTITATIVE W GRAM STAIN: Culture: NO GROWTH

## 2011-07-03 LAB — DIFFERENTIAL
Basophils Absolute: 0.4 — ABNORMAL HIGH
Basophils Relative: 0
Basophils Relative: 0
Eosinophils Absolute: 0.1
Eosinophils Absolute: 0.4
Eosinophils Relative: 2
Lymphocytes Relative: 4 — ABNORMAL LOW
Lymphocytes Relative: 4 — ABNORMAL LOW
Lymphocytes Relative: 5 — ABNORMAL LOW
Monocytes Absolute: 0.3
Monocytes Relative: 1 — ABNORMAL LOW
Monocytes Relative: 7
Neutro Abs: 12.6 — ABNORMAL HIGH
Neutro Abs: 8.9 — ABNORMAL HIGH
Neutrophils Relative %: 90 — ABNORMAL HIGH

## 2011-07-03 LAB — PHOSPHORUS
Phosphorus: 2.5
Phosphorus: 2.8
Phosphorus: 3.6
Phosphorus: 4.5
Phosphorus: 4.6

## 2011-07-03 LAB — CBC
HCT: 26.5 — ABNORMAL LOW
HCT: 28 — ABNORMAL LOW
HCT: 29.1 — ABNORMAL LOW
Hemoglobin: 10.5 — ABNORMAL LOW
Hemoglobin: 8.6 — ABNORMAL LOW
Hemoglobin: 9 — ABNORMAL LOW
Hemoglobin: 9.7 — ABNORMAL LOW
MCHC: 32
MCHC: 33.6
MCHC: 33.7
MCHC: 33.8
MCHC: 34.1
MCHC: 34.5
MCHC: 34.5
MCV: 87.7
Platelets: 160
Platelets: 161
Platelets: 166
Platelets: 180
Platelets: 193
Platelets: 232
Platelets: 388
RBC: 2.81 — ABNORMAL LOW
RBC: 2.85 — ABNORMAL LOW
RBC: 2.89 — ABNORMAL LOW
RBC: 2.93 — ABNORMAL LOW
RBC: 3.03 — ABNORMAL LOW
RBC: 3.29 — ABNORMAL LOW
RDW: 18.1 — ABNORMAL HIGH
RDW: 19.9 — ABNORMAL HIGH
RDW: 20.2 — ABNORMAL HIGH
RDW: 20.3 — ABNORMAL HIGH
RDW: 20.3 — ABNORMAL HIGH
RDW: 20.4 — ABNORMAL HIGH
RDW: 20.4 — ABNORMAL HIGH
RDW: 20.5 — ABNORMAL HIGH
RDW: 21.2 — ABNORMAL HIGH
WBC: 10.6 — ABNORMAL HIGH
WBC: 12.9 — ABNORMAL HIGH
WBC: 21.4 — ABNORMAL HIGH
WBC: 22.4 — ABNORMAL HIGH

## 2011-07-03 LAB — ANAEROBIC CULTURE

## 2011-07-03 LAB — CROSSMATCH

## 2011-07-03 LAB — APTT: aPTT: 37

## 2011-07-03 LAB — URINALYSIS, ROUTINE W REFLEX MICROSCOPIC
Ketones, ur: NEGATIVE
Nitrite: NEGATIVE
Protein, ur: 100 — AB
Urobilinogen, UA: 0.2
pH: 6

## 2011-07-03 LAB — TRIGLYCERIDES: Triglycerides: 203 — ABNORMAL HIGH

## 2011-07-03 LAB — WOUND CULTURE

## 2011-07-03 LAB — CHOLESTEROL, TOTAL
Cholesterol: 52
Cholesterol: 61

## 2011-07-03 LAB — POTASSIUM: Potassium: 4.1

## 2011-07-03 LAB — CREATININE, FLUID (PLEURAL, PERITONEAL, JP DRAINAGE): Creat, Fluid: <0.3

## 2011-07-03 LAB — PREALBUMIN: Prealbumin: 4.1 — ABNORMAL LOW

## 2011-07-03 LAB — CARDIAC PANEL(CRET KIN+CKTOT+MB+TROPI)
CK, MB: 3.3
Total CK: 175

## 2011-07-03 LAB — B-NATRIURETIC PEPTIDE (CONVERTED LAB)
Pro B Natriuretic peptide (BNP): 157 — ABNORMAL HIGH
Pro B Natriuretic peptide (BNP): 253 — ABNORMAL HIGH

## 2011-07-03 LAB — BODY FLUID CULTURE

## 2011-07-03 LAB — PROTIME-INR
Prothrombin Time: 15
Prothrombin Time: 17 — ABNORMAL HIGH

## 2011-07-03 LAB — URINE MICROSCOPIC-ADD ON

## 2011-07-03 LAB — SAMPLE TO BLOOD BANK

## 2011-07-04 LAB — COMPREHENSIVE METABOLIC PANEL
ALT: 24
ALT: 29
Albumin: 2.5 — ABNORMAL LOW
Albumin: 2.7 — ABNORMAL LOW
Alkaline Phosphatase: 128 — ABNORMAL HIGH
Alkaline Phosphatase: 138 — ABNORMAL HIGH
BUN: 21
BUN: 40 — ABNORMAL HIGH
BUN: 9
CO2: 22
CO2: 24
Calcium: 8.5
Chloride: 107
Chloride: 112
Chloride: 114 — ABNORMAL HIGH
Creatinine, Ser: 0.96
Creatinine, Ser: 5.29 — ABNORMAL HIGH
GFR calc non Af Amer: 36 — ABNORMAL LOW
GFR calc non Af Amer: 57 — ABNORMAL LOW
Glucose, Bld: 102 — ABNORMAL HIGH
Glucose, Bld: 132 — ABNORMAL HIGH
Glucose, Bld: 98
Potassium: 4.2
Potassium: 4.2
Sodium: 138
Sodium: 143
Total Bilirubin: 0.7
Total Bilirubin: 0.7
Total Bilirubin: 0.7
Total Protein: 5 — ABNORMAL LOW
Total Protein: 5.2 — ABNORMAL LOW
Total Protein: 5.7 — ABNORMAL LOW

## 2011-07-04 LAB — MAGNESIUM
Magnesium: 1.2 — ABNORMAL LOW
Magnesium: 1.4 — ABNORMAL LOW

## 2011-07-04 LAB — CBC
HCT: 26.7 — ABNORMAL LOW
HCT: 28.9 — ABNORMAL LOW
HCT: 30.1 — ABNORMAL LOW
HCT: 32.3 — ABNORMAL LOW
Hemoglobin: 10.1 — ABNORMAL LOW
Hemoglobin: 9.1 — ABNORMAL LOW
Hemoglobin: 9.8 — ABNORMAL LOW
Hemoglobin: 9.9 — ABNORMAL LOW
MCHC: 33.4
MCHC: 34
MCV: 86.9
MCV: 86.9
MCV: 87
Platelets: 451 — ABNORMAL HIGH
Platelets: 529 — ABNORMAL HIGH
Platelets: 612 — ABNORMAL HIGH
RBC: 3.07 — ABNORMAL LOW
RDW: 17.1 — ABNORMAL HIGH
RDW: 17.1 — ABNORMAL HIGH
RDW: 17.4 — ABNORMAL HIGH
WBC: 11 — ABNORMAL HIGH
WBC: 6.9
WBC: 8.3

## 2011-07-04 LAB — BASIC METABOLIC PANEL
BUN: 14
BUN: 35 — ABNORMAL HIGH
Chloride: 102
Chloride: 105
GFR calc Af Amer: >60
GFR calc non Af Amer: 14 — ABNORMAL LOW
Potassium: 3.3 — ABNORMAL LOW
Potassium: 3.5
Sodium: 137
Sodium: 142

## 2011-07-04 LAB — DIFFERENTIAL
Basophils Absolute: 0.1
Basophils Absolute: 0.2 — ABNORMAL HIGH
Basophils Relative: 1
Lymphocytes Relative: 18
Lymphocytes Relative: 24
Monocytes Absolute: 0.6
Monocytes Absolute: 0.9
Monocytes Relative: 8
Neutro Abs: 4
Neutro Abs: 7.8 — ABNORMAL HIGH
Neutrophils Relative %: 62
Neutrophils Relative %: 70

## 2011-07-04 LAB — VITAMIN D 1,25 DIHYDROXY: Vit D, 1,25-Dihydroxy: 14 pg/mL — ABNORMAL LOW (ref 15–75)

## 2011-07-05 LAB — COMPREHENSIVE METABOLIC PANEL
ALT: 68 — ABNORMAL HIGH
AST: 36
Albumin: 3 — ABNORMAL LOW
Alkaline Phosphatase: 84
BUN: 7
CO2: 23
Calcium: 6.9 — ABNORMAL LOW
Chloride: 107
Creatinine, Ser: 0.96
GFR calc Af Amer: >60
GFR calc non Af Amer: 57 — ABNORMAL LOW
Glucose, Bld: 117 — ABNORMAL HIGH
Potassium: 3 — ABNORMAL LOW
Sodium: 140
Total Bilirubin: 1
Total Protein: 5.9 — ABNORMAL LOW

## 2011-07-05 LAB — CBC
HCT: 34.6 — ABNORMAL LOW
Hemoglobin: 11.7 — ABNORMAL LOW
MCHC: 33.9
MCV: 86.2
Platelets: 274
RBC: 4.01
RDW: 15.6 — ABNORMAL HIGH
WBC: 8.9

## 2011-07-05 LAB — POCT I-STAT, CHEM 8
BUN: 11
Calcium, Ion: 0.79 — ABNORMAL LOW
Chloride: 106
Creatinine, Ser: 1
Glucose, Bld: 109 — ABNORMAL HIGH
HCT: 34 — ABNORMAL LOW
Hemoglobin: 11.6 — ABNORMAL LOW
Potassium: 3.1 — ABNORMAL LOW
Sodium: 143
TCO2: 21

## 2011-07-05 LAB — BASIC METABOLIC PANEL
BUN: 15
BUN: 6
BUN: 9
CO2: 25
CO2: 26
Calcium: 7.8 — ABNORMAL LOW
Calcium: 9
Calcium: 9.8
Chloride: 105
Chloride: 107
Creatinine, Ser: 0.76
Creatinine, Ser: 0.81
Creatinine, Ser: 1.07
GFR calc Af Amer: >60
GFR calc Af Amer: >60
GFR calc Af Amer: >60
GFR calc non Af Amer: 51 — ABNORMAL LOW
GFR calc non Af Amer: >60
GFR calc non Af Amer: >60
Glucose, Bld: 107 — ABNORMAL HIGH
Glucose, Bld: 109 — ABNORMAL HIGH
Potassium: 3.3 — ABNORMAL LOW
Potassium: 4.1
Sodium: 140
Sodium: 141

## 2011-07-05 LAB — DIFFERENTIAL
Basophils Relative: 0
Eosinophils Absolute: 0
Eosinophils Relative: 1
Lymphs Abs: 1.5
Neutrophils Relative %: 76

## 2011-07-05 LAB — URINALYSIS, ROUTINE W REFLEX MICROSCOPIC
Bilirubin Urine: NEGATIVE
Glucose, UA: NEGATIVE
Hgb urine dipstick: NEGATIVE
Ketones, ur: NEGATIVE
Nitrite: NEGATIVE
Protein, ur: 30 — AB
Specific Gravity, Urine: 1.022
Urobilinogen, UA: 0.2
pH: 5.5

## 2011-07-05 LAB — MAGNESIUM
Magnesium: 0.4 — CL
Magnesium: 1.7
Magnesium: 1.8
Magnesium: 1.9
Magnesium: 2.2

## 2011-07-05 LAB — POCT CARDIAC MARKERS
CKMB, poc: 2.2
Myoglobin, poc: >500
Operator id: 264421
Troponin i, poc: <0.05

## 2011-07-05 LAB — URINE MICROSCOPIC-ADD ON

## 2011-07-05 LAB — CK TOTAL AND CKMB (NOT AT ARMC)
CK, MB: 5.9 — ABNORMAL HIGH
Total CK: 355 — ABNORMAL HIGH

## 2011-07-05 LAB — CALCIUM: Calcium: 6.5 — ABNORMAL LOW

## 2011-07-09 LAB — CBC
HCT: 38.3
Hemoglobin: 10.3 — ABNORMAL LOW
Hemoglobin: 12.9
MCHC: 33.6
RBC: 3.11 — ABNORMAL LOW
RBC: 3.88
RBC: 4.1
WBC: 14.9 — ABNORMAL HIGH
WBC: 6.4

## 2011-07-09 LAB — BASIC METABOLIC PANEL
CO2: 24
Calcium: 9.2
Calcium: 9.2
Creatinine, Ser: 0.83
GFR calc Af Amer: >60
GFR calc Af Amer: >60
GFR calc non Af Amer: >60
GFR calc non Af Amer: >60
Glucose, Bld: 133 — ABNORMAL HIGH
Potassium: 4.2
Sodium: 137
Sodium: 140

## 2011-07-09 LAB — BODY FLUID CULTURE

## 2011-07-09 LAB — DIFFERENTIAL
Basophils Relative: 0
Eosinophils Absolute: 0.1
Neutrophils Relative %: 66

## 2011-07-09 LAB — COMPREHENSIVE METABOLIC PANEL
ALT: 25
Albumin: 2.7 — ABNORMAL LOW
Alkaline Phosphatase: 60
Alkaline Phosphatase: 80
BUN: 20
CO2: 21
CO2: 26
Calcium: 9.7
Chloride: 105
Creatinine, Ser: 1.55 — ABNORMAL HIGH
GFR calc non Af Amer: 33 — ABNORMAL LOW
GFR calc non Af Amer: >60
Glucose, Bld: 155 — ABNORMAL HIGH
Glucose, Bld: 93
Sodium: 141
Total Bilirubin: 1.3 — ABNORMAL HIGH

## 2011-07-09 LAB — URINE MICROSCOPIC-ADD ON

## 2011-07-09 LAB — URINALYSIS, ROUTINE W REFLEX MICROSCOPIC
Bilirubin Urine: NEGATIVE
Ketones, ur: NEGATIVE
Nitrite: NEGATIVE
Protein, ur: NEGATIVE
Urobilinogen, UA: 0.2
pH: 6

## 2011-07-09 LAB — ANAEROBIC CULTURE: Gram Stain: NONE SEEN

## 2011-07-09 LAB — MAGNESIUM: Magnesium: 1.6

## 2011-07-12 ENCOUNTER — Ambulatory Visit: Payer: Medicare Other | Attending: Gynecology | Admitting: Gynecology

## 2011-07-12 ENCOUNTER — Other Ambulatory Visit: Payer: Self-pay | Admitting: Gynecology

## 2011-07-12 DIAGNOSIS — D279 Benign neoplasm of unspecified ovary: Secondary | ICD-10-CM | POA: Insufficient documentation

## 2011-07-12 DIAGNOSIS — Z853 Personal history of malignant neoplasm of breast: Secondary | ICD-10-CM | POA: Insufficient documentation

## 2011-07-12 DIAGNOSIS — Z803 Family history of malignant neoplasm of breast: Secondary | ICD-10-CM | POA: Insufficient documentation

## 2011-07-12 DIAGNOSIS — Z9089 Acquired absence of other organs: Secondary | ICD-10-CM | POA: Insufficient documentation

## 2011-07-12 DIAGNOSIS — Z9079 Acquired absence of other genital organ(s): Secondary | ICD-10-CM | POA: Insufficient documentation

## 2011-07-12 DIAGNOSIS — Z90710 Acquired absence of both cervix and uterus: Secondary | ICD-10-CM | POA: Insufficient documentation

## 2011-07-12 DIAGNOSIS — Z79899 Other long term (current) drug therapy: Secondary | ICD-10-CM | POA: Insufficient documentation

## 2011-07-12 DIAGNOSIS — I10 Essential (primary) hypertension: Secondary | ICD-10-CM | POA: Insufficient documentation

## 2011-07-13 LAB — CA 125: CA 125: 9.6 U/mL (ref 0.0–30.2)
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
## 2011-08-06 ENCOUNTER — Encounter (HOSPITAL_BASED_OUTPATIENT_CLINIC_OR_DEPARTMENT_OTHER): Payer: Medicare Other | Admitting: Oncology

## 2011-08-06 DIAGNOSIS — C50919 Malignant neoplasm of unspecified site of unspecified female breast: Secondary | ICD-10-CM

## 2011-08-06 DIAGNOSIS — C801 Malignant (primary) neoplasm, unspecified: Secondary | ICD-10-CM

## 2011-08-06 DIAGNOSIS — D0590 Unspecified type of carcinoma in situ of unspecified breast: Secondary | ICD-10-CM

## 2011-08-06 DIAGNOSIS — Z803 Family history of malignant neoplasm of breast: Secondary | ICD-10-CM

## 2011-08-15 ENCOUNTER — Telehealth: Payer: Self-pay | Admitting: *Deleted

## 2011-08-15 NOTE — Telephone Encounter (Signed)
Patient left message asking to try paragoric for her diarrhea since her pharmacist told her tincture of opium is off the market now. Informed patient that Dr. Truett Perna prefers her PCP order the paragoric for her since he will see her more often to monitor her;, but if he is not willing to do so, he will order it.She will follow up with her PCP and call back if he will not prescribe this for her.

## 2011-10-30 DIAGNOSIS — E785 Hyperlipidemia, unspecified: Secondary | ICD-10-CM | POA: Diagnosis not present

## 2011-10-30 DIAGNOSIS — I2510 Atherosclerotic heart disease of native coronary artery without angina pectoris: Secondary | ICD-10-CM | POA: Diagnosis not present

## 2011-10-30 DIAGNOSIS — I209 Angina pectoris, unspecified: Secondary | ICD-10-CM | POA: Diagnosis not present

## 2011-10-30 DIAGNOSIS — I10 Essential (primary) hypertension: Secondary | ICD-10-CM | POA: Diagnosis not present

## 2011-11-06 DIAGNOSIS — I209 Angina pectoris, unspecified: Secondary | ICD-10-CM | POA: Diagnosis not present

## 2011-11-15 DIAGNOSIS — I5031 Acute diastolic (congestive) heart failure: Secondary | ICD-10-CM | POA: Diagnosis not present

## 2011-11-15 DIAGNOSIS — R0602 Shortness of breath: Secondary | ICD-10-CM | POA: Diagnosis not present

## 2011-11-22 DIAGNOSIS — E538 Deficiency of other specified B group vitamins: Secondary | ICD-10-CM | POA: Diagnosis not present

## 2011-12-09 DIAGNOSIS — M949 Disorder of cartilage, unspecified: Secondary | ICD-10-CM | POA: Diagnosis not present

## 2011-12-09 DIAGNOSIS — Z853 Personal history of malignant neoplasm of breast: Secondary | ICD-10-CM | POA: Diagnosis not present

## 2011-12-23 DIAGNOSIS — E538 Deficiency of other specified B group vitamins: Secondary | ICD-10-CM | POA: Diagnosis not present

## 2012-01-09 ENCOUNTER — Encounter: Payer: Self-pay | Admitting: Gynecologic Oncology

## 2012-01-10 ENCOUNTER — Ambulatory Visit: Payer: Medicare Other | Attending: Gynecology | Admitting: Gynecology

## 2012-01-10 ENCOUNTER — Ambulatory Visit: Payer: Medicare Other | Admitting: Lab

## 2012-01-10 ENCOUNTER — Encounter: Payer: Self-pay | Admitting: Gynecology

## 2012-01-10 VITALS — BP 138/62 | HR 68 | Temp 98.3°F | Resp 16 | Ht 67.0 in | Wt 153.4 lb

## 2012-01-10 DIAGNOSIS — C569 Malignant neoplasm of unspecified ovary: Secondary | ICD-10-CM

## 2012-01-10 DIAGNOSIS — D3910 Neoplasm of uncertain behavior of unspecified ovary: Secondary | ICD-10-CM | POA: Diagnosis not present

## 2012-01-10 LAB — BUN: BUN: 24 mg/dL — ABNORMAL HIGH (ref 6–23)
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
## 2012-01-10 NOTE — Patient Instructions (Signed)
We will contact you to arrange an appointment to have ultrasound of the abdominal mass and possible biopsy.  We will schedule routine visit in 6 months to see [place].

## 2012-01-13 LAB — CA 125: CA 125: 9.3 U/mL (ref 0.0–30.2)

## 2012-01-14 ENCOUNTER — Telehealth: Payer: Self-pay | Admitting: Gynecologic Oncology

## 2012-01-14 NOTE — Telephone Encounter (Signed)
Message left with husband with CA 125 results.  No concerns or questions voiced.

## 2012-01-20 ENCOUNTER — Telehealth: Payer: Self-pay | Admitting: *Deleted

## 2012-01-20 NOTE — Telephone Encounter (Signed)
Will note to md

## 2012-01-21 ENCOUNTER — Other Ambulatory Visit: Payer: Self-pay | Admitting: *Deleted

## 2012-01-21 DIAGNOSIS — C50919 Malignant neoplasm of unspecified site of unspecified female breast: Secondary | ICD-10-CM

## 2012-01-21 MED ORDER — ANASTROZOLE 1 MG PO TABS: 1.0000 mg | ORAL_TABLET | Freq: Every day | ORAL | Status: DC

## 2012-01-22 DIAGNOSIS — E538 Deficiency of other specified B group vitamins: Secondary | ICD-10-CM | POA: Diagnosis not present

## 2012-01-23 ENCOUNTER — Encounter: Payer: Self-pay | Admitting: Gynecologic Oncology

## 2012-01-23 ENCOUNTER — Ambulatory Visit: Payer: Medicare Other | Attending: Gynecologic Oncology | Admitting: Gynecologic Oncology

## 2012-01-23 VITALS — BP 160/58 | HR 60 | Temp 98.0°F | Resp 18 | Ht 67.0 in | Wt 153.8 lb

## 2012-01-23 DIAGNOSIS — D3910 Neoplasm of uncertain behavior of unspecified ovary: Secondary | ICD-10-CM | POA: Diagnosis not present

## 2012-01-23 DIAGNOSIS — L539 Erythematous condition, unspecified: Secondary | ICD-10-CM | POA: Insufficient documentation

## 2012-01-23 DIAGNOSIS — M7989 Other specified soft tissue disorders: Secondary | ICD-10-CM | POA: Insufficient documentation

## 2012-01-23 DIAGNOSIS — Z9049 Acquired absence of other specified parts of digestive tract: Secondary | ICD-10-CM | POA: Diagnosis not present

## 2012-01-23 DIAGNOSIS — I10 Essential (primary) hypertension: Secondary | ICD-10-CM | POA: Diagnosis not present

## 2012-01-23 DIAGNOSIS — Z90710 Acquired absence of both cervix and uterus: Secondary | ICD-10-CM | POA: Diagnosis not present

## 2012-01-23 DIAGNOSIS — Z853 Personal history of malignant neoplasm of breast: Secondary | ICD-10-CM | POA: Insufficient documentation

## 2012-01-23 DIAGNOSIS — R222 Localized swelling, mass and lump, trunk: Secondary | ICD-10-CM

## 2012-01-23 NOTE — Patient Instructions (Signed)
Antibiotics as directed.
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
## 2012-01-29 ENCOUNTER — Ambulatory Visit (HOSPITAL_COMMUNITY)
Admission: RE | Admit: 2012-01-29 | Discharge: 2012-01-29 | Disposition: A | Discharge status: Home / Self Care | Payer: Medicare Other | Source: Ambulatory Visit | Attending: Gynecologic Oncology | Admitting: Gynecologic Oncology

## 2012-01-29 DIAGNOSIS — Z923 Personal history of irradiation: Secondary | ICD-10-CM | POA: Diagnosis not present

## 2012-01-29 DIAGNOSIS — I517 Cardiomegaly: Secondary | ICD-10-CM | POA: Diagnosis not present

## 2012-01-29 DIAGNOSIS — R509 Fever, unspecified: Secondary | ICD-10-CM | POA: Insufficient documentation

## 2012-01-29 DIAGNOSIS — R197 Diarrhea, unspecified: Secondary | ICD-10-CM | POA: Diagnosis not present

## 2012-01-29 DIAGNOSIS — Z9089 Acquired absence of other organs: Secondary | ICD-10-CM | POA: Diagnosis not present

## 2012-01-29 DIAGNOSIS — N200 Calculus of kidney: Secondary | ICD-10-CM | POA: Insufficient documentation

## 2012-01-29 DIAGNOSIS — Z9049 Acquired absence of other specified parts of digestive tract: Secondary | ICD-10-CM | POA: Insufficient documentation

## 2012-01-29 DIAGNOSIS — R222 Localized swelling, mass and lump, trunk: Secondary | ICD-10-CM

## 2012-01-29 DIAGNOSIS — E278 Other specified disorders of adrenal gland: Secondary | ICD-10-CM | POA: Insufficient documentation

## 2012-01-29 DIAGNOSIS — C50919 Malignant neoplasm of unspecified site of unspecified female breast: Secondary | ICD-10-CM | POA: Diagnosis not present

## 2012-01-29 DIAGNOSIS — N201 Calculus of ureter: Secondary | ICD-10-CM | POA: Insufficient documentation

## 2012-01-29 DIAGNOSIS — C569 Malignant neoplasm of unspecified ovary: Secondary | ICD-10-CM | POA: Diagnosis not present

## 2012-01-29 DIAGNOSIS — R1900 Intra-abdominal and pelvic swelling, mass and lump, unspecified site: Secondary | ICD-10-CM | POA: Diagnosis not present

## 2012-01-29 DIAGNOSIS — D3000 Benign neoplasm of unspecified kidney: Secondary | ICD-10-CM | POA: Insufficient documentation

## 2012-01-29 DIAGNOSIS — Z90710 Acquired absence of both cervix and uterus: Secondary | ICD-10-CM | POA: Diagnosis not present

## 2012-01-29 DIAGNOSIS — N281 Cyst of kidney, acquired: Secondary | ICD-10-CM | POA: Diagnosis not present

## 2012-01-29 MED ORDER — IOHEXOL 300 MG/ML  SOLN
100.0000 mL | Freq: Once | INTRAMUSCULAR | Status: AC | PRN
  Administered 2012-01-29: 100 mL via INTRAVENOUS

## 2012-02-12 ENCOUNTER — Encounter: Payer: Self-pay | Admitting: Gynecologic Oncology

## 2012-02-12 ENCOUNTER — Ambulatory Visit: Payer: Medicare Other | Attending: Gynecologic Oncology | Admitting: Gynecologic Oncology

## 2012-02-12 VITALS — BP 110/48 | HR 58 | Temp 97.6°F | Resp 16 | Ht 67.0 in | Wt 151.0 lb

## 2012-02-12 DIAGNOSIS — Z853 Personal history of malignant neoplasm of breast: Secondary | ICD-10-CM | POA: Diagnosis not present

## 2012-02-12 DIAGNOSIS — C569 Malignant neoplasm of unspecified ovary: Secondary | ICD-10-CM | POA: Diagnosis not present

## 2012-02-12 DIAGNOSIS — Z79899 Other long term (current) drug therapy: Secondary | ICD-10-CM | POA: Diagnosis not present

## 2012-02-12 DIAGNOSIS — I10 Essential (primary) hypertension: Secondary | ICD-10-CM | POA: Diagnosis not present

## 2012-02-12 DIAGNOSIS — L02219 Cutaneous abscess of trunk, unspecified: Secondary | ICD-10-CM | POA: Insufficient documentation
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
## 2012-02-12 NOTE — Patient Instructions (Signed)
Return in 4-6 weeks if area not completely healed.

## 2012-02-24 DIAGNOSIS — E538 Deficiency of other specified B group vitamins: Secondary | ICD-10-CM | POA: Diagnosis not present

## 2012-03-16 ENCOUNTER — Ambulatory Visit: Payer: Medicare Other | Attending: Gynecology | Admitting: Gynecology

## 2012-03-16 ENCOUNTER — Encounter: Payer: Self-pay | Admitting: Gynecology

## 2012-03-16 VITALS — BP 128/68 | HR 64 | Temp 98.1°F | Resp 14 | Ht 67.0 in | Wt 150.1 lb

## 2012-03-16 DIAGNOSIS — Z853 Personal history of malignant neoplasm of breast: Secondary | ICD-10-CM | POA: Diagnosis not present

## 2012-03-16 DIAGNOSIS — R1900 Intra-abdominal and pelvic swelling, mass and lump, unspecified site: Secondary | ICD-10-CM | POA: Insufficient documentation

## 2012-03-16 DIAGNOSIS — Z90710 Acquired absence of both cervix and uterus: Secondary | ICD-10-CM | POA: Insufficient documentation

## 2012-03-16 DIAGNOSIS — I10 Essential (primary) hypertension: Secondary | ICD-10-CM | POA: Insufficient documentation

## 2012-03-16 DIAGNOSIS — Z9089 Acquired absence of other organs: Secondary | ICD-10-CM | POA: Insufficient documentation

## 2012-03-16 DIAGNOSIS — Z803 Family history of malignant neoplasm of breast: Secondary | ICD-10-CM | POA: Insufficient documentation

## 2012-03-16 DIAGNOSIS — Z9079 Acquired absence of other genital organ(s): Secondary | ICD-10-CM | POA: Diagnosis not present

## 2012-03-16 DIAGNOSIS — R222 Localized swelling, mass and lump, trunk: Secondary | ICD-10-CM

## 2012-03-16 DIAGNOSIS — Z7982 Long term (current) use of aspirin: Secondary | ICD-10-CM | POA: Insufficient documentation

## 2012-03-16 DIAGNOSIS — C569 Malignant neoplasm of unspecified ovary: Secondary | ICD-10-CM | POA: Diagnosis not present

## 2012-03-16 DIAGNOSIS — Z79899 Other long term (current) drug therapy: Secondary | ICD-10-CM | POA: Insufficient documentation

## 2012-03-16 NOTE — Patient Instructions (Signed)
Please make an appointment to see Dr.Streck for evaluation and management. Return to see me as previously scheduled

## 2012-03-16 NOTE — Progress Notes (Signed)
Consult Note: Gyn-Onc   [name] 78 y.o. female  Chief Complaint  Patient presents with  . Abd wall lump    Follow up    Interval History: The patient returns today for continued followup of an abdominal wall mass. The patient reports that the mass is somewhat smaller than continues to drain slightly. She wears a Band-Aid over it. She did have significant improvement after the use of Keflex. She denies any fever or chills.  HPI: The patient had a low malignant potential tumor of the ovary apparently diagnosed in 1976. 2008 she developed a small bowel obstruction underwent laparoscopic procedure complicated by intestinal perforation.  Review of Systems:10 point review of systems is negative as noted above.   Vitals: Blood pressure 128/68, pulse 64, temperature 98.1 F (36.7 C), temperature source Oral, resp. rate 14, height 5\' 7"  (1.702 m), weight 150 lb 1.6 oz (68.085 kg).  Physical Exam: General : The patient is a healthy woman in no acute distress.  HEENT: normocephalic, extraoccular movements normal; neck is supple without thyromegally  Lynphnodes: Supraclavicular and inguinal nodes not enlarged  Abdomen: Soft, non-tender, no ascites, no organomegally, no masses, no hernias. The Area of a prior stoma is erythematous measured approximate 1 x 1 cm. The central portion is draining slightly purulent material. It is nontender to palpation. Pelvic: Deferred d  Lower extremities: No edema or varicosities. 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  We'll ask the patient to make an appointment with. Dr. Cicero Duck for further evaluation and probable excision of the suture and opening of the wound. She reports that  Dr. Jamey Ripa has previously remove a suture.   No Known Allergies  Past Medical History  Diagnosis Date  . Hypertension   . Breast cancer     Past Surgical History  Procedure Date  . Total abdominal hysterectomy w/  bilateral salpingoophorectomy 1976  . Cholecystectomy   . Laparoscopic bowel resection   . Breast lumpectomy     2010- malignant, 6-8 total    Current Outpatient Prescriptions  Medication Sig Dispense Refill  . amLODipine (NORVASC) 5 MG tablet Take 5 mg by mouth daily.      Marland Kitchen anastrozole (ARIMIDEX) 1 MG tablet Take 1 tablet (1 mg total) by mouth daily.  90 tablet  2  . aspirin 81 MG tablet Take 81 mg by mouth daily.      . Calcium Carbonate-Vitamin D (CALTRATE 600+D PO) Take 1 tablet by mouth 2 (two) times daily.      . carvedilol (COREG) 25 MG tablet Take 12.5 mg by mouth 2 (two) times daily with a meal.      . cholecalciferol (VITAMIN D) 1000 UNITS tablet Take 1,000 Units by mouth 2 (two) times daily.      . Esomeprazole Magnesium (NEXIUM PO) Take 40 mg by mouth daily.       . Loperamide HCl (IMODIUM PO) Take 2 tablets by mouth 2 (two) times daily.       . magnesium oxide (MAG-OX) 400 MG tablet Take 400 mg by mouth daily.      . Multiple Vitamins-Minerals (MULTIVITAMIN PO) Take 1 tablet by mouth daily.      . Valsartan (DIOVAN PO) Take 320 mg by mouth daily.         History   Social History  . Marital Status: Married    Spouse Name: N/A    Number of Children: N/A  . Years of Education:  N/A   Occupational History  . Not on file.   Social History Main Topics  . Smoking status: Never Smoker   . Smokeless tobacco: Not on file  . Alcohol Use: No  . Drug Use: Not on file  . Sexually Active: Not Currently   Other Topics Concern  . Not on file   Social History Narrative  . No narrative on file    Family History  Problem Relation Age of Onset  . Breast cancer Sister       Jeannette Corpus, MD 03/16/2012, 11:39 AM

## 2012-03-25 DIAGNOSIS — Z853 Personal history of malignant neoplasm of breast: Secondary | ICD-10-CM | POA: Diagnosis not present

## 2012-03-30 DIAGNOSIS — Z23 Encounter for immunization: Secondary | ICD-10-CM | POA: Diagnosis not present

## 2012-04-06 DIAGNOSIS — Z0000 Encounter for general adult medical examination without abnormal findings: Secondary | ICD-10-CM | POA: Diagnosis not present

## 2012-04-06 DIAGNOSIS — I2510 Atherosclerotic heart disease of native coronary artery without angina pectoris: Secondary | ICD-10-CM | POA: Diagnosis not present

## 2012-04-06 DIAGNOSIS — I10 Essential (primary) hypertension: Secondary | ICD-10-CM | POA: Diagnosis not present

## 2012-04-06 DIAGNOSIS — E538 Deficiency of other specified B group vitamins: Secondary | ICD-10-CM | POA: Diagnosis not present

## 2012-04-06 DIAGNOSIS — E785 Hyperlipidemia, unspecified: Secondary | ICD-10-CM | POA: Diagnosis not present

## 2012-04-10 ENCOUNTER — Encounter (INDEPENDENT_AMBULATORY_CARE_PROVIDER_SITE_OTHER): Payer: Self-pay | Admitting: Surgery

## 2012-04-10 ENCOUNTER — Ambulatory Visit (INDEPENDENT_AMBULATORY_CARE_PROVIDER_SITE_OTHER): Payer: Medicare Other | Admitting: Surgery

## 2012-04-10 VITALS — BP 128/68 | HR 64 | Temp 97.2°F | Resp 16 | Ht 67.0 in | Wt 150.8 lb

## 2012-04-10 DIAGNOSIS — T8140XA Infection following a procedure, unspecified, initial encounter: Secondary | ICD-10-CM | POA: Diagnosis not present

## 2012-04-10 NOTE — Patient Instructions (Signed)
Change the dressing as needed. I'll see back in about 2 weeks and we will decide about the need for doing surgery for this what appears to be a stitch abscess

## 2012-04-10 NOTE — Progress Notes (Signed)
Chief complaint: Possible stitch abscess  History of present illness: This patient underwent laparotomy a few years ago with a temporary ileostomy. This was followed by closure of the ileostomy. In the past he has had a small suture abscess from the midline. She now presents with chronic drainage at the ileostomy site and a concern for another stitch abscess. Recent CT scans show no evidence for recurrent cancer or other problems that might account for the small subcutaneous nodule in drainage.  Exam: Vital signs:BP 128/68  Pulse 64  Temp 97.2 F (36.2 C) (Temporal)  Resp 16  Ht 5\' 7"  (1.702 m)  Wt 150 lb 12.8 oz (68.402 kg)  BMI 23.62 kg/m2 General: The patient is alert oriented and healthy-appearing Abdomen: Soft and benign. In the right mid abdomen at the site of the ileostomy scar is a red raised scar with a small amount of purulent drainage and open tract. I thought this was consistent with a stitch abscess tract.  Impression: Likely stitch abscess  Plan: After discussion with the patient I went ahead and anesthetized the area with 1% Xylocaine with epi and excise a portion of the old scar. There was a tract that appeared to go down towards the fascia approximately 2.5 cm deep. However, under local anesthesia, was unable to identify a suture at the base of the tract we could remove. I therefore just packed it was model for him and will continue local wound care.  Her option here will be to just leave this alone and continue local wound care with chronic drainage versus a wire expiration under anesthesia. Discuss this further when she comes back in 2 weeks.

## 2012-04-28 ENCOUNTER — Ambulatory Visit (INDEPENDENT_AMBULATORY_CARE_PROVIDER_SITE_OTHER): Payer: Medicare Other | Admitting: Surgery

## 2012-04-28 ENCOUNTER — Encounter (INDEPENDENT_AMBULATORY_CARE_PROVIDER_SITE_OTHER): Payer: Self-pay | Admitting: Surgery

## 2012-04-28 VITALS — BP 126/84 | HR 74 | Temp 97.2°F | Resp 18 | Ht 67.0 in | Wt 150.0 lb

## 2012-04-28 DIAGNOSIS — T148XXA Other injury of unspecified body region, initial encounter: Secondary | ICD-10-CM | POA: Diagnosis not present

## 2012-04-28 DIAGNOSIS — T8140XA Infection following a procedure, unspecified, initial encounter: Secondary | ICD-10-CM | POA: Diagnosis not present

## 2012-04-28 NOTE — Progress Notes (Signed)
Chief complaint: Followup stitch abscess  History of present illness: This patient a few weeks ago with what appears to be a small stitch abscess to the right of her midline scar. I tried to find a suture by probing at our initial visit but was unable to locate anything and she persists with draining so she came back for followup. She's not had any other symptoms other than intermittent small amounts of drainage.  Exam: Abdomen is soft, basically benign. She has a small opening is about 2 mm to the right of the midline. It is unclear to me where this goes. Impression probable suture abscess or retained suture.  Plan: I think the first step is to try to get a sonogram to see If this communicates to the deeper tissues. Once that's done we can make more definitive plans.

## 2012-04-28 NOTE — Patient Instructions (Signed)
We will schedule an x-ray so that we can try to determine where this small opening in her abdomen leads to prior to scheduling or deciding about any surgery

## 2012-05-04 ENCOUNTER — Ambulatory Visit (HOSPITAL_COMMUNITY)
Admission: RE | Admit: 2012-05-04 | Discharge: 2012-05-04 | Disposition: A | Discharge status: Home / Self Care | Payer: Medicare Other | Source: Ambulatory Visit | Attending: Surgery | Admitting: Surgery

## 2012-05-04 DIAGNOSIS — T148XXA Other injury of unspecified body region, initial encounter: Secondary | ICD-10-CM

## 2012-05-04 DIAGNOSIS — X58XXXA Exposure to other specified factors, initial encounter: Secondary | ICD-10-CM | POA: Insufficient documentation

## 2012-05-04 DIAGNOSIS — S31109A Unspecified open wound of abdominal wall, unspecified quadrant without penetration into peritoneal cavity, initial encounter: Secondary | ICD-10-CM | POA: Insufficient documentation

## 2012-05-04 DIAGNOSIS — E538 Deficiency of other specified B group vitamins: Secondary | ICD-10-CM | POA: Diagnosis not present

## 2012-05-04 MED ORDER — IOHEXOL 300 MG/ML  SOLN: 10.0000 mL | Freq: Once | INTRAMUSCULAR | Status: AC | PRN

## 2012-05-05 ENCOUNTER — Telehealth (INDEPENDENT_AMBULATORY_CARE_PROVIDER_SITE_OTHER): Payer: Self-pay | Admitting: Surgery

## 2012-05-05 ENCOUNTER — Other Ambulatory Visit (INDEPENDENT_AMBULATORY_CARE_PROVIDER_SITE_OTHER): Payer: Self-pay | Admitting: Surgery

## 2012-05-05 NOTE — Telephone Encounter (Signed)
I reviewed the results of her sinus tract infection. There is a small subcutaneous cavity. There is no connection with the abdomen or intestine. I told her that I thought her options were to just continue to experience drainage and keep dressings on it or we could, under anesthesia, excise the area and see if there is a residual suture or chronic infected tissue that is keeping this area from healing. She would like to have this surgically excised. We should do this as an outpatient but will require anesthesia. She would like to defer it about 3 weeks as she is expecting a grandchild any day now.

## 2012-05-06 ENCOUNTER — Other Ambulatory Visit (HOSPITAL_COMMUNITY)

## 2012-05-12 ENCOUNTER — Telehealth (INDEPENDENT_AMBULATORY_CARE_PROVIDER_SITE_OTHER): Payer: Self-pay | Admitting: General Surgery

## 2012-05-12 NOTE — Telephone Encounter (Signed)
Spoke with patient's husband. They were wanting to know if she could take her medicine the day of surgery. I advised to discuss with preop when she sees them. She also works for an [employer] and has to lift some. I advised this was a very superficial wound and she should be able to go back to work that following Monday after surgery on Thursday unless she has any complications. They will call back with any additional questions.

## 2012-05-12 NOTE — Telephone Encounter (Signed)
Message copied by Liliana Cline on Tue May 12, 2012 12:15 PM ------      Message from: Erin Sons      Created: Tue May 12, 2012  9:32 AM      Regarding: Dr Jamey Ripa      Contact: [phone]       Pt has questions about her upcoming sx on 05/28/12. Please call.If pt isn't at home she stated you can discuss it with her spouse. She will tell him the questions she want to discuss with you.            Thanks

## 2012-05-25 ENCOUNTER — Encounter (HOSPITAL_BASED_OUTPATIENT_CLINIC_OR_DEPARTMENT_OTHER): Payer: Self-pay | Admitting: *Deleted

## 2012-05-25 NOTE — Progress Notes (Signed)
Called dr Katrinka Blazing for notes To come in for lab

## 2012-05-26 ENCOUNTER — Encounter (HOSPITAL_BASED_OUTPATIENT_CLINIC_OR_DEPARTMENT_OTHER)
Admission: RE | Admit: 2012-05-26 | Discharge: 2012-05-26 | Disposition: A | Discharge status: Home / Self Care | Payer: Medicare Other | Source: Ambulatory Visit | Attending: Surgery | Admitting: Surgery

## 2012-05-26 DIAGNOSIS — M795 Residual foreign body in soft tissue: Secondary | ICD-10-CM | POA: Diagnosis not present

## 2012-05-26 DIAGNOSIS — T8189XA Other complications of procedures, not elsewhere classified, initial encounter: Secondary | ICD-10-CM | POA: Diagnosis not present

## 2012-05-26 DIAGNOSIS — Z1889 Other specified retained foreign body fragments: Secondary | ICD-10-CM | POA: Diagnosis not present

## 2012-05-26 LAB — BASIC METABOLIC PANEL WITH GFR
BUN: 18 mg/dL (ref 6–23)
CO2: 24 meq/L (ref 19–32)
Calcium: 9.6 mg/dL (ref 8.4–10.5)
Chloride: 108 meq/L (ref 96–112)
Creatinine, Ser: 1.02 mg/dL (ref 0.50–1.10)
GFR calc Af Amer: 61 mL/min — ABNORMAL LOW
GFR calc non Af Amer: 52 mL/min — ABNORMAL LOW
Glucose, Bld: 194 mg/dL — ABNORMAL HIGH (ref 70–99)
Potassium: 3.7 meq/L (ref 3.5–5.1)
Sodium: 141 meq/L (ref 135–145)

## 2012-05-27 ENCOUNTER — Telehealth: Payer: Self-pay | Admitting: Oncology

## 2012-05-27 NOTE — Telephone Encounter (Signed)
S/w the pt and she is aware of her oct appt with dr Truett Perna

## 2012-05-28 ENCOUNTER — Encounter (HOSPITAL_BASED_OUTPATIENT_CLINIC_OR_DEPARTMENT_OTHER): Payer: Self-pay | Admitting: Anesthesiology

## 2012-05-28 ENCOUNTER — Encounter (HOSPITAL_BASED_OUTPATIENT_CLINIC_OR_DEPARTMENT_OTHER): Payer: Self-pay | Admitting: *Deleted

## 2012-05-28 ENCOUNTER — Ambulatory Visit (HOSPITAL_BASED_OUTPATIENT_CLINIC_OR_DEPARTMENT_OTHER)
Admission: RE | Admit: 2012-05-28 | Discharge: 2012-05-28 | Disposition: A | Discharge status: Home / Self Care | Payer: Medicare Other | Source: Ambulatory Visit | Attending: Surgery | Admitting: Surgery

## 2012-05-28 ENCOUNTER — Encounter (HOSPITAL_BASED_OUTPATIENT_CLINIC_OR_DEPARTMENT_OTHER)
Admission: RE | Disposition: A | Discharge status: Home / Self Care | Payer: Self-pay | Source: Ambulatory Visit | Attending: Surgery

## 2012-05-28 ENCOUNTER — Telehealth (INDEPENDENT_AMBULATORY_CARE_PROVIDER_SITE_OTHER): Payer: Self-pay | Admitting: General Surgery

## 2012-05-28 ENCOUNTER — Ambulatory Visit (HOSPITAL_BASED_OUTPATIENT_CLINIC_OR_DEPARTMENT_OTHER): Payer: Medicare Other | Admitting: Anesthesiology

## 2012-05-28 DIAGNOSIS — Y849 Medical procedure, unspecified as the cause of abnormal reaction of the patient, or of later complication, without mention of misadventure at the time of the procedure: Secondary | ICD-10-CM | POA: Insufficient documentation

## 2012-05-28 DIAGNOSIS — T8189XA Other complications of procedures, not elsewhere classified, initial encounter: Secondary | ICD-10-CM

## 2012-05-28 DIAGNOSIS — M795 Residual foreign body in soft tissue: Secondary | ICD-10-CM

## 2012-05-28 DIAGNOSIS — T8141XA Infection following a procedure, superficial incisional surgical site, initial encounter: Secondary | ICD-10-CM | POA: Diagnosis present

## 2012-05-28 DIAGNOSIS — Z1889 Other specified retained foreign body fragments: Secondary | ICD-10-CM | POA: Diagnosis not present

## 2012-05-28 DIAGNOSIS — L089 Local infection of the skin and subcutaneous tissue, unspecified: Secondary | ICD-10-CM | POA: Diagnosis not present

## 2012-05-28 HISTORY — DX: Gastro-esophageal reflux disease without esophagitis: K21.9

## 2012-05-28 HISTORY — PX: WOUND DEBRIDEMENT: SHX247

## 2012-05-28 SURGERY — DEBRIDEMENT, WOUND, ABDOMEN
Anesthesia: General | Site: Abdomen | Wound class: Contaminated

## 2012-05-28 MED ORDER — CEFAZOLIN SODIUM-DEXTROSE 2-3 GM-% IV SOLR
2.0000 g | INTRAVENOUS | Status: AC
  Administered 2012-05-28: 2 g via INTRAVENOUS

## 2012-05-28 MED ORDER — ONDANSETRON HCL 4 MG/2ML IJ SOLN
INTRAMUSCULAR | Status: DC | PRN
  Administered 2012-05-28: 4 mg via INTRAVENOUS

## 2012-05-28 MED ORDER — HYDROMORPHONE HCL PF 1 MG/ML IJ SOLN: 0.2500 mg | INTRAMUSCULAR | Status: DC | PRN

## 2012-05-28 MED ORDER — HYDROCODONE-ACETAMINOPHEN 5-325 MG PO TABS
1.0000 | ORAL_TABLET | Freq: Once | ORAL | Status: AC | PRN
  Administered 2012-05-28: 1 via ORAL

## 2012-05-28 MED ORDER — BUPIVACAINE HCL (PF) 0.25 % IJ SOLN
INTRAMUSCULAR | Status: DC | PRN
  Administered 2012-05-28: 20 mL

## 2012-05-28 MED ORDER — LACTATED RINGERS IV SOLN
INTRAVENOUS | Status: DC
  Administered 2012-05-28: 07:00:00 via INTRAVENOUS

## 2012-05-28 MED ORDER — METHYLENE BLUE 1 % INJ SOLN
INTRAMUSCULAR | Status: DC | PRN
  Administered 2012-05-28: 1 mL

## 2012-05-28 MED ORDER — LIDOCAINE HCL (CARDIAC) 20 MG/ML IV SOLN
INTRAVENOUS | Status: DC | PRN
  Administered 2012-05-28: 25 mg via INTRAVENOUS

## 2012-05-28 MED ORDER — DEXAMETHASONE SODIUM PHOSPHATE 4 MG/ML IJ SOLN
INTRAMUSCULAR | Status: DC | PRN
  Administered 2012-05-28: 4 mg via INTRAVENOUS

## 2012-05-28 MED ORDER — FENTANYL CITRATE 0.05 MG/ML IJ SOLN
INTRAMUSCULAR | Status: DC | PRN
  Administered 2012-05-28: 100 ug via INTRAVENOUS

## 2012-05-28 MED ORDER — EPHEDRINE SULFATE 50 MG/ML IJ SOLN
INTRAMUSCULAR | Status: DC | PRN
  Administered 2012-05-28: 10 mg via INTRAVENOUS

## 2012-05-28 MED ORDER — PROPOFOL 10 MG/ML IV EMUL
INTRAVENOUS | Status: DC | PRN
  Administered 2012-05-28: 200 mg via INTRAVENOUS

## 2012-05-28 MED ORDER — DROPERIDOL 2.5 MG/ML IJ SOLN: 0.6250 mg | INTRAMUSCULAR | Status: DC | PRN

## 2012-05-28 MED ORDER — HYDROCODONE-ACETAMINOPHEN 5-325 MG PO TABS: 1.0000 | ORAL_TABLET | ORAL | Status: AC | PRN

## 2012-05-28 SURGICAL SUPPLY — 44 items
BANDAGE ELASTIC 4 VELCRO ST LF (GAUZE/BANDAGES/DRESSINGS) IMPLANT
BLADE HEX COATED 2.75 (ELECTRODE) IMPLANT
BLADE SURG 15 STRL LF DISP TIS (BLADE) ×1 IMPLANT
BLADE SURG 15 STRL SS (BLADE) ×1
CANISTER SUCTION 1200CC (MISCELLANEOUS) IMPLANT
CHLORAPREP W/TINT 26ML (MISCELLANEOUS) ×2 IMPLANT
COVER MAYO STAND STRL (DRAPES) ×2 IMPLANT
COVER TABLE BACK 60X90 (DRAPES) ×2 IMPLANT
DECANTER SPIKE VIAL GLASS SM (MISCELLANEOUS) IMPLANT
DERMABOND ADVANCED (GAUZE/BANDAGES/DRESSINGS)
DERMABOND ADVANCED .7 DNX12 (GAUZE/BANDAGES/DRESSINGS) IMPLANT
DRAPE EXTREMITY T 121X128X90 (DRAPE) IMPLANT
DRAPE PED LAPAROTOMY (DRAPES) ×2 IMPLANT
DRAPE UTILITY XL STRL (DRAPES) ×2 IMPLANT
DRSG TEGADERM 4X4.75 (GAUZE/BANDAGES/DRESSINGS) IMPLANT
ELECT REM PT RETURN 9FT ADLT (ELECTROSURGICAL) ×2
ELECTRODE REM PT RTRN 9FT ADLT (ELECTROSURGICAL) ×1 IMPLANT
GAUZE SPONGE 4X4 12PLY STRL LF (GAUZE/BANDAGES/DRESSINGS) ×4 IMPLANT
GAUZE SPONGE 4X4 16PLY XRAY LF (GAUZE/BANDAGES/DRESSINGS) IMPLANT
GLOVE BIOGEL M STRL SZ7.5 (GLOVE) ×2 IMPLANT
GLOVE BIOGEL PI IND STRL 8 (GLOVE) ×1 IMPLANT
GLOVE BIOGEL PI INDICATOR 8 (GLOVE) ×1
GLOVE EUDERMIC 7 POWDERFREE (GLOVE) ×2 IMPLANT
GOWN PREVENTION PLUS XLARGE (GOWN DISPOSABLE) ×4 IMPLANT
NDL SAFETY ECLIPSE 18X1.5 (NEEDLE) ×1 IMPLANT
NEEDLE HYPO 18GX1.5 SHARP (NEEDLE) ×1
NEEDLE HYPO 25X1 1.5 SAFETY (NEEDLE) ×2 IMPLANT
NS IRRIG 1000ML POUR BTL (IV SOLUTION) ×2 IMPLANT
PACK BASIN DAY SURGERY FS (CUSTOM PROCEDURE TRAY) ×2 IMPLANT
PEN SKIN MARKING BROAD TIP (MISCELLANEOUS) IMPLANT
PENCIL BUTTON HOLSTER BLD 10FT (ELECTRODE) ×2 IMPLANT
SPONGE LAP 4X18 X RAY DECT (DISPOSABLE) ×2 IMPLANT
STAPLER VISISTAT 35W (STAPLE) IMPLANT
SUT MNCRL AB 4-0 PS2 18 (SUTURE) ×2 IMPLANT
SUT PROLENE 4 0 PS 2 18 (SUTURE) ×2 IMPLANT
SUT VIC AB 0 SH 27 (SUTURE) ×6 IMPLANT
SUT VICRYL 3-0 CR8 SH (SUTURE) ×2 IMPLANT
SYR CONTROL 10ML LL (SYRINGE) ×2 IMPLANT
TAPE CLOTH SURG 4X10 WHT LF (GAUZE/BANDAGES/DRESSINGS) ×2 IMPLANT
TOWEL OR 17X24 6PK STRL BLUE (TOWEL DISPOSABLE) ×2 IMPLANT
TOWEL OR NON WOVEN STRL DISP B (DISPOSABLE) ×2 IMPLANT
TUBE CONNECTING 20X1/4 (TUBING) IMPLANT
WATER STERILE IRR 1000ML POUR (IV SOLUTION) IMPLANT
YANKAUER SUCT BULB TIP NO VENT (SUCTIONS) IMPLANT

## 2012-05-28 NOTE — Interval H&P Note (Signed)
History and Physical Interval Note:  05/28/2012 7:16 AM  [name]  has presented today for surgery, with the diagnosis of non healing wound  The various methods of treatment have been discussed with the patient and family. After consideration of risks, benefits and other options for treatment, the patient has consented to  Procedure(s) (LRB): DEBRIDEMENT ABDOMINAL WOUND (N/A) as a surgical intervention .  The patient's history has been reviewed, patient examined, no change in status, stable for surgery.  I have reviewed the patient's chart and labs.  Questions were answered to the patient's satisfaction.   Exam today: VITAL SIGNS: BP 174/80  Pulse 65  Temp 98 F (36.7 C) (Oral)  Resp 16  Ht 5\' 7"  (1.702 m)  Wt 151 lb (68.493 kg)  BMI 23.65 kg/m2  SpO2 98% GENERAL:  The patient is alert, oriented, and generally healthy-appearing, NAD. Mood and affect are normal.  HEENT:  The head is normocephalic, the eyes nonicteric, the pupils were round regular and equal. EOMs are normal. Pharynx normal. Dentition good.  NECK:  The neck is supple and there are no masses or thyromegaly.  LUNGS: Normal respirations and clear to auscultation.  HEART: Regular rhythm, with no murmurs rubs or gallops. Pulses are intact carotid dorsalis pedis and posterior tibial. No significant varicosities are noted.   ABDOMEN: Soft, flat, and nontender. No masses or organomegaly is noted. No hernias are noted. Bowel sounds are normal.Open sinus tract just to the right of midline  EXTREMITIES:  Good range of motion, no edema.   [name]

## 2012-05-28 NOTE — Transfer of Care (Signed)
Immediate Anesthesia Transfer of Care Note  Patient: [name]  Procedure(s) Performed: Procedure(s) (LRB): DEBRIDEMENT ABDOMINAL WOUND (N/A)  Patient Location: PACU  Anesthesia Type: General  Level of Consciousness: awake, alert  and oriented  Airway & Oxygen Therapy: Patient Spontanous Breathing and Patient connected to face mask oxygen  Post-op Assessment: Report given to PACU RN and Post -op Vital signs reviewed and stable  Post vital signs: Reviewed and stable  Complications: No apparent anesthesia complications

## 2012-05-28 NOTE — Anesthesia Preprocedure Evaluation (Addendum)
Anesthesia Evaluation  Patient identified by MRN, date of birth, ID band Patient awake    Reviewed: Allergy & Precautions, H&P , NPO status , Patient's Chart, lab work & pertinent test results  Airway Mallampati: I TM Distance: >3 FB Neck ROM: full    Dental  (+) Teeth Intact and Dental Advidsory Given   Pulmonary neg pulmonary ROS,  breath sounds clear to auscultation  Pulmonary exam normal       Cardiovascular hypertension, On Medications + angina with exertion + CAD Rhythm:regular Rate:Normal     Neuro/Psych    GI/Hepatic Neg liver ROS, GERD-  Medicated,  Endo/Other  negative endocrine ROS  Renal/GU negative Renal ROS     Musculoskeletal   Abdominal Normal abdominal exam  (+)   Peds  Hematology   Anesthesia Other Findings   Reproductive/Obstetrics                          Anesthesia Physical Anesthesia Plan  ASA: III  Anesthesia Plan: General   Post-op Pain Management:    Induction: Intravenous  Airway Management Planned: LMA  Additional Equipment:   Intra-op Plan:   Post-operative Plan:   Informed Consent: I have reviewed the patients History and Physical, chart, labs and discussed the procedure including the risks, benefits and alternatives for the proposed anesthesia with the patient or authorized representative who has indicated his/her understanding and acceptance.   Dental advisory given and Dental Advisory Given  Plan Discussed with: CRNA, Anesthesiologist and Surgeon  Anesthesia Plan Comments:        Anesthesia Quick Evaluation

## 2012-05-28 NOTE — Anesthesia Postprocedure Evaluation (Signed)
Anesthesia Post Note  Patient: [name]  Procedure(s) Performed: Procedure(s) (LRB): DEBRIDEMENT ABDOMINAL WOUND (N/A)  Anesthesia type: general  Patient location: PACU  Post pain: Pain level controlled  Post assessment: Patient's Cardiovascular Status Stable  Last Vitals:  Filed Vitals:   05/28/12 0900  BP: 144/57  Pulse: 56  Temp:   Resp: 13    Post vital signs: Reviewed and stable  Level of consciousness: sedated  Complications: No apparent anesthesia complications

## 2012-05-28 NOTE — Telephone Encounter (Signed)
PT HAD SURGERY TODAY AND WAS CALLING IN TO MAKE HER FOLLOW-UP APPOINTMENT. PER DISCHARGE INSTRUCTIONS SHE IS TO RETURN IN APPROXIMATELY 10DAYS TO SEE DR. Jamey Ripa. I WAS UNABLE TO SEE A TIME FOR THE WEEK OF 06-08-12. PLEASE ADVISE WHERE TO SCHEDULE PT./PT'S [id number]

## 2012-05-28 NOTE — Op Note (Signed)
JERMAINE THOLL [facility] [birth year] [id number] 05/06/2012  Preoperative diagnosis: Chronic draining abdominal wound sinus tract, likely from retained Prolene suture  Postoperative diagnosis: Same  Procedure: Excision of chronic draining abdominal wound sinus tract and retaining Prolene sutures x3 Surgeon: Currie Paris, MD, FACS   Anesthesia: General   Clinical History and Indications: This patient has had a prior ileostomy. She has had several months of chronic drainage from the skin in the right upper quadrant. Under local anesthesia the office was unable to retrieve any retained suture. A sinus tract injection showed no communication with the bowel. After discussion with the patient we'll have to bring her to the operating room for excision under anesthesia.  Description of Procedure: I saw the patient in the preoperative area and confirmed the plans for the procedure I marked the area of the sinus tract in the right upper quadrant.  The patient was taken to the operating room after satisfactory general anesthesia had been obtained the abdomen was prepped and draped in the time out was done. I injected a tiny amount of methylene blue into the sinus tract. I then placed a tear duct probe into the sinus tract. I made a transverse elliptical incision around the sinus tract and then was able to grasp the sinus tract and the tear duct probe was Allis clamp. Using that for traction I excised the sinus tract and towards the deeper and I found 3 Prolene sutures which were removed. I came under the sinus tract and had excised we'll do the anterior fascia as I was removing the sutures.  Using the Optiview magnifying system I checked to make sure there is no residual suture. I could see the peritoneum exposed but we did not entered. Once I was convinced there is no residual foreign body I injected 0.25% Marcaine. I reclosed the anterior fascia with some 0 Vicryl sutures. I then irrigated and closed the  skin with interrupted 4-0 Prolene sutures.  The patient tolerated procedure well. There no operative complications.Counts were correct  Currie Paris, MD, FACS 05/28/2012 8:17 AM

## 2012-05-28 NOTE — Anesthesia Procedure Notes (Signed)
Procedure Name: LMA Insertion Date/Time: 05/28/2012 7:32 AM Performed by: Zenia Resides D Pre-anesthesia Checklist: Patient identified, Emergency Drugs available, Suction available, Patient being monitored and Timeout performed Patient Re-evaluated:Patient Re-evaluated prior to inductionOxygen Delivery Method: Circle System Utilized Preoxygenation: Pre-oxygenation with 100% oxygen Intubation Type: IV induction Ventilation: Mask ventilation without difficulty LMA: LMA inserted LMA Size: 4.0 Number of attempts: 1 Airway Equipment and Method: bite block Placement Confirmation: positive ETCO2 and breath sounds checked- equal and bilateral Tube secured with: Tape Dental Injury: Teeth and Oropharynx as per pre-operative assessment

## 2012-05-28 NOTE — Telephone Encounter (Signed)
Spoke with pt and informed her that she has a PO appt w/ Dr. Jamey Ripa on 9/13 at 8:40.  Informed the pt that if she has any problems then she needs to call the office.  She understood this and was fine with the appt time and date.

## 2012-05-28 NOTE — H&P (View-Only) (Signed)
Chief complaint: Followup stitch abscess  History of present illness: This patient a few weeks ago with what appears to be a small stitch abscess to the right of her midline scar. I tried to find a suture by probing at our initial visit but was unable to locate anything and she persists with draining so she came back for followup. She's not had any other symptoms other than intermittent small amounts of drainage.  Exam: Abdomen is soft, basically benign. She has a small opening is about 2 mm to the right of the midline. It is unclear to me where this goes. Impression probable suture abscess or retained suture.  Plan: I think the first step is to try to get a sonogram to see If this communicates to the deeper tissues. Once that's done we can make more definitive plans. 

## 2012-05-29 ENCOUNTER — Telehealth (INDEPENDENT_AMBULATORY_CARE_PROVIDER_SITE_OTHER): Payer: Self-pay

## 2012-05-29 NOTE — Telephone Encounter (Signed)
Called pt to advise her that her path report is benign per Dr Maud Deed.

## 2012-06-02 ENCOUNTER — Encounter (HOSPITAL_BASED_OUTPATIENT_CLINIC_OR_DEPARTMENT_OTHER): Payer: Self-pay | Admitting: Surgery

## 2012-06-04 DIAGNOSIS — E538 Deficiency of other specified B group vitamins: Secondary | ICD-10-CM | POA: Diagnosis not present

## 2012-06-15 DIAGNOSIS — I2510 Atherosclerotic heart disease of native coronary artery without angina pectoris: Secondary | ICD-10-CM | POA: Diagnosis not present

## 2012-06-15 DIAGNOSIS — I10 Essential (primary) hypertension: Secondary | ICD-10-CM | POA: Diagnosis not present

## 2012-06-15 DIAGNOSIS — E785 Hyperlipidemia, unspecified: Secondary | ICD-10-CM | POA: Diagnosis not present

## 2012-06-19 ENCOUNTER — Encounter (INDEPENDENT_AMBULATORY_CARE_PROVIDER_SITE_OTHER): Payer: Self-pay | Admitting: General Surgery

## 2012-06-19 ENCOUNTER — Ambulatory Visit (INDEPENDENT_AMBULATORY_CARE_PROVIDER_SITE_OTHER): Payer: Medicare Other | Admitting: Surgery

## 2012-06-19 ENCOUNTER — Encounter (INDEPENDENT_AMBULATORY_CARE_PROVIDER_SITE_OTHER): Payer: Self-pay | Admitting: Surgery

## 2012-06-19 VITALS — BP 128/72 | HR 64 | Temp 97.6°F | Resp 16 | Ht 67.0 in | Wt 150.0 lb

## 2012-06-19 DIAGNOSIS — Z09 Encounter for follow-up examination after completed treatment for conditions other than malignant neoplasm: Secondary | ICD-10-CM

## 2012-06-19 NOTE — Patient Instructions (Signed)
You may return to work when you are six weeks after surgery.  We will see you again on an as needed basis. Please call the office at [phone] if you have any questions or concerns. Thank you for allowing [place] to take care of you.

## 2012-06-19 NOTE — Progress Notes (Signed)
NAME: [name]                                            DOB: [birth year] DATE: 06/19/2012                                                  MRN: [id number]  CC: Post op   HPI: This patient comes in for post op follow-up.Sheunderwent removal of some old prolene sutures that caused a draining sinus on 05/28/12. She feels that she is doing well.  PE:  VITAL SIGNS: BP 128/72  Pulse 64  Temp 97.6 F (36.4 C) (Temporal)  Resp 16  Ht 5\' 7"  (1.702 m)  Wt 150 lb (68.04 kg)  BMI 23.49 kg/m2  General: The patient appears to be healthy, NAD Abdomen is soft and benign. Wound well healed.     IMPRESSION: The patient is doing well S/P removal of fascial sutures.    PLAN: RTC PRN Advised her no lifting until six weeks after surgery as we had to repair the fascia after the prolene sutures were removed

## 2012-06-29 ENCOUNTER — Ambulatory Visit: Payer: Medicare Other | Admitting: Lab

## 2012-06-29 ENCOUNTER — Ambulatory Visit: Payer: Medicare Other | Attending: Gynecology | Admitting: Gynecology

## 2012-06-29 ENCOUNTER — Encounter: Payer: Self-pay | Admitting: Gynecology

## 2012-06-29 VITALS — BP 124/54 | HR 62 | Temp 97.7°F | Resp 18 | Ht 67.0 in | Wt 149.1 lb

## 2012-06-29 DIAGNOSIS — D200 Benign neoplasm of soft tissue of retroperitoneum: Secondary | ICD-10-CM | POA: Diagnosis not present

## 2012-06-29 DIAGNOSIS — K219 Gastro-esophageal reflux disease without esophagitis: Secondary | ICD-10-CM | POA: Insufficient documentation

## 2012-06-29 DIAGNOSIS — Z853 Personal history of malignant neoplasm of breast: Secondary | ICD-10-CM | POA: Diagnosis not present

## 2012-06-29 DIAGNOSIS — C569 Malignant neoplasm of unspecified ovary: Secondary | ICD-10-CM

## 2012-06-29 DIAGNOSIS — Z79899 Other long term (current) drug therapy: Secondary | ICD-10-CM | POA: Diagnosis not present

## 2012-06-29 DIAGNOSIS — Z803 Family history of malignant neoplasm of breast: Secondary | ICD-10-CM | POA: Insufficient documentation

## 2012-06-29 DIAGNOSIS — Z9079 Acquired absence of other genital organ(s): Secondary | ICD-10-CM | POA: Diagnosis not present

## 2012-06-29 DIAGNOSIS — Z7982 Long term (current) use of aspirin: Secondary | ICD-10-CM | POA: Diagnosis not present

## 2012-06-29 DIAGNOSIS — Z90710 Acquired absence of both cervix and uterus: Secondary | ICD-10-CM | POA: Insufficient documentation

## 2012-06-29 DIAGNOSIS — E785 Hyperlipidemia, unspecified: Secondary | ICD-10-CM | POA: Diagnosis not present

## 2012-06-29 DIAGNOSIS — D279 Benign neoplasm of unspecified ovary: Secondary | ICD-10-CM | POA: Diagnosis not present

## 2012-06-29 DIAGNOSIS — D3910 Neoplasm of uncertain behavior of unspecified ovary: Secondary | ICD-10-CM | POA: Diagnosis not present

## 2012-06-29 DIAGNOSIS — I10 Essential (primary) hypertension: Secondary | ICD-10-CM | POA: Insufficient documentation
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
## 2012-06-29 NOTE — Patient Instructions (Signed)
Return to see [place] in 6 months. C A. 125 today.

## 2012-06-30 ENCOUNTER — Telehealth: Payer: Self-pay | Admitting: Gynecologic Oncology

## 2012-06-30 LAB — CA 125: CA 125: 7.4 U/mL (ref 0.0–30.2)

## 2012-06-30 NOTE — Telephone Encounter (Signed)
Message left for patient with CA 125 results: 7.4.  Instructed to call for any questions or concerns.

## 2012-07-06 DIAGNOSIS — E538 Deficiency of other specified B group vitamins: Secondary | ICD-10-CM | POA: Diagnosis not present

## 2012-07-06 DIAGNOSIS — Z23 Encounter for immunization: Secondary | ICD-10-CM | POA: Diagnosis not present

## 2012-07-28 DIAGNOSIS — Z01419 Encounter for gynecological examination (general) (routine) without abnormal findings: Secondary | ICD-10-CM | POA: Diagnosis not present

## 2012-07-28 DIAGNOSIS — Z124 Encounter for screening for malignant neoplasm of cervix: Secondary | ICD-10-CM | POA: Diagnosis not present

## 2012-08-03 ENCOUNTER — Telehealth: Payer: Self-pay | Admitting: *Deleted

## 2012-08-03 ENCOUNTER — Telehealth: Payer: Self-pay | Admitting: Oncology

## 2012-08-03 NOTE — Telephone Encounter (Signed)
Pt called and r/s appt from 10/29 to 08/07/12, nurse notified

## 2012-08-03 NOTE — Telephone Encounter (Signed)
Per patient voicemail message, she needs to reschedule her appt for tomorrow. I have forwarded the message to the desk RN.  JMW

## 2012-08-04 ENCOUNTER — Ambulatory Visit: Payer: Medicare Other | Admitting: Oncology

## 2012-08-06 ENCOUNTER — Telehealth: Payer: Self-pay | Admitting: Oncology

## 2012-08-06 DIAGNOSIS — E538 Deficiency of other specified B group vitamins: Secondary | ICD-10-CM | POA: Diagnosis not present

## 2012-08-06 DIAGNOSIS — E785 Hyperlipidemia, unspecified: Secondary | ICD-10-CM | POA: Diagnosis not present

## 2012-08-06 DIAGNOSIS — Z79899 Other long term (current) drug therapy: Secondary | ICD-10-CM | POA: Diagnosis not present

## 2012-08-06 NOTE — Telephone Encounter (Signed)
Called pt, talked to husband left message regarding apt for 08/07/12

## 2012-08-07 ENCOUNTER — Ambulatory Visit: Payer: Medicare Other | Admitting: Oncology

## 2012-08-28 ENCOUNTER — Ambulatory Visit (HOSPITAL_BASED_OUTPATIENT_CLINIC_OR_DEPARTMENT_OTHER): Payer: Medicare Other | Admitting: Oncology

## 2012-08-28 ENCOUNTER — Telehealth: Payer: Self-pay | Admitting: Oncology

## 2012-08-28 VITALS — BP 122/66 | HR 60 | Temp 97.6°F | Resp 20 | Ht 67.0 in | Wt 152.4 lb

## 2012-08-28 DIAGNOSIS — Z170 Estrogen receptor positive status [ER+]: Secondary | ICD-10-CM | POA: Diagnosis not present

## 2012-08-28 DIAGNOSIS — C50419 Malignant neoplasm of upper-outer quadrant of unspecified female breast: Secondary | ICD-10-CM

## 2012-08-28 DIAGNOSIS — Z803 Family history of malignant neoplasm of breast: Secondary | ICD-10-CM | POA: Diagnosis not present

## 2012-08-28 DIAGNOSIS — C569 Malignant neoplasm of unspecified ovary: Secondary | ICD-10-CM

## 2012-08-28 NOTE — Progress Notes (Signed)
    Cancer Center    OFFICE PROGRESS NOTE   INTERVAL HISTORY:   She returns as scheduled. She continues Arimidex. No hot flashes or arthralgias. No change over either breast.  She underwent surgical removal of a abdominal wound sinus tract and retained Prolene sutures on 05/28/2012.  A bilateral mammogram was negative on 03/25/2012. She saw Dr. Loree Fee on 06/29/2012 and there was no evidence of recurrent ovarian cancer.  Objective:  Vital signs in last 24 hours:  Blood pressure 122/66, pulse 60, temperature 97.6 F (36.4 C), temperature source Oral, resp. rate 20, height 5\' 7"  (1.702 m), weight 152 lb 6.4 oz (69.128 kg).    HEENT: Neck without mass Lymphatics: No cervical, supraclavicular, axillary, or inguinal nodes Resp: Lungs clear bilaterally Cardio: Regular rate and rhythm GI: No hepatosplenomegaly, no mass, no apparent ascites Vascular: No leg edema Breasts: Firm tissue surrounding the right lumpectomy scar, no mass in either breast  Skin:? Lipoma at the right upper back     Lab Results: CA 125 on 06/29/2012-7.4   Medications: I have reviewed the patient's current medications.  Assessment/Plan: 1. 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 2. Remote hysterectomy and bilateral salpingo-oophorectomy with the cytology from 1976 confirming numerous "psammoma bodies." 3. 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   a. Status post a needle localized lumpectomy and sentinel lymph node biopsy 04/13/2009 with the pathology confirming high-grade ductal carcinoma in situ with an associated 0.26 mm invasive carcinoma, ER positive, PR positive, and HER2  positive. b. Status post adjuvant right breast radiation completed 06/19/2009. c. Initiation of Arimidex 05/01/2009. 4. Family history of breast cancer. 5. History of Clostridium difficile colitis. 6. Frequent bowel movements following the bowel resection in 2009.   Disposition:  She remains in clinical remission from ovarian and breast cancer. The plan is to continue Arimidex for 5 years. Ms. Enerson will return for an office visit in one year. She will followup with Dr Kemper DurieSharol Given in the interim.   Thornton Papas, MD  08/28/2012  9:49 AM

## 2012-08-28 NOTE — Telephone Encounter (Signed)
appts made and printed for pt  °

## 2012-09-07 DIAGNOSIS — E538 Deficiency of other specified B group vitamins: Secondary | ICD-10-CM | POA: Diagnosis not present

## 2012-10-07 DIAGNOSIS — N200 Calculus of kidney: Secondary | ICD-10-CM

## 2012-10-07 HISTORY — DX: Calculus of kidney: N20.0

## 2012-10-12 DIAGNOSIS — E538 Deficiency of other specified B group vitamins: Secondary | ICD-10-CM | POA: Diagnosis not present

## 2012-10-20 ENCOUNTER — Other Ambulatory Visit: Payer: Self-pay | Admitting: Oncology

## 2012-10-20 DIAGNOSIS — C569 Malignant neoplasm of unspecified ovary: Secondary | ICD-10-CM

## 2012-11-12 DIAGNOSIS — E538 Deficiency of other specified B group vitamins: Secondary | ICD-10-CM | POA: Diagnosis not present

## 2012-12-14 DIAGNOSIS — E538 Deficiency of other specified B group vitamins: Secondary | ICD-10-CM | POA: Diagnosis not present

## 2013-01-04 ENCOUNTER — Encounter: Payer: Self-pay | Admitting: Gynecologic Oncology

## 2013-01-04 ENCOUNTER — Ambulatory Visit: Payer: Medicare Other | Attending: Gynecology | Admitting: Gynecologic Oncology

## 2013-01-04 ENCOUNTER — Other Ambulatory Visit: Payer: Medicare Other | Admitting: Lab

## 2013-01-04 VITALS — BP 130/50 | HR 66 | Temp 97.8°F | Resp 16 | Ht 67.0 in | Wt 150.7 lb

## 2013-01-04 DIAGNOSIS — D4959 Neoplasm of unspecified behavior of other genitourinary organ: Secondary | ICD-10-CM | POA: Insufficient documentation

## 2013-01-04 DIAGNOSIS — D3910 Neoplasm of uncertain behavior of unspecified ovary: Secondary | ICD-10-CM

## 2013-01-04 DIAGNOSIS — R1909 Other intra-abdominal and pelvic swelling, mass and lump: Secondary | ICD-10-CM

## 2013-01-04 LAB — CA 125: CA 125: 10.3 U/mL (ref 0.0–30.2)
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
## 2013-01-04 NOTE — Patient Instructions (Signed)
Doing well.  Plan to follow up in one year at GYN Oncology or sooner if needed.  Please call for any questions or concerns.  Thank you for coming to see me today.  I appreciate your confidence in choosing [facility] for your medical care.  If you have any questions about your visit today, please call our office and we will get back to you as soon as possible.  Warner Mccreedy, NP Gynecologic Oncology  Leg Cramps Leg cramps that occur during exercise can be caused by poor circulation or dehydration. However, muscle cramps that occur at rest or during the night are usually not due to any serious medical problem. Heat cramps may cause muscle spasms during hot weather.  CAUSES There is no clear cause for muscle cramps. However, dehydration may be a factor for those who do not drink enough fluids and those who exercise in the heat. Imbalances in the level of sodium, potassium, calcium or magnesium in the muscle tissue may also be a factor. Some medications, such as water pills (diuretics), may cause loss of chemicals that the body needs (like sodium and potassium) and cause muscle cramps. TREATMENT   Make sure your diet has enough fluids and essential minerals for the muscle to work normally.  Avoid strenuous exercise for several days if you have been having frequent leg cramps.  Stretch and massage the cramped muscle for several minutes.  Some medicines may be helpful in some patients with night cramps. Only take over-the-counter or prescription medicines as directed by your caregiver. SEEK IMMEDIATE MEDICAL CARE IF:   Your leg cramps become worse.  Your foot becomes cold, numb, or blue. Document Released: 10/31/2004 Document Revised: 12/16/2011 Document Reviewed: 10/18/2008 [facility] Patient Information 2013 [place], [place].

## 2013-01-05 ENCOUNTER — Telehealth: Payer: Self-pay | Admitting: Gynecologic Oncology

## 2013-01-05 NOTE — Telephone Encounter (Signed)
Patient informed of CA 125 results.  No concerns voiced.  Instructed to call for any needs. 

## 2013-01-18 DIAGNOSIS — E538 Deficiency of other specified B group vitamins: Secondary | ICD-10-CM | POA: Diagnosis not present

## 2013-02-22 DIAGNOSIS — E538 Deficiency of other specified B group vitamins: Secondary | ICD-10-CM | POA: Diagnosis not present

## 2013-03-26 DIAGNOSIS — Z853 Personal history of malignant neoplasm of breast: Secondary | ICD-10-CM | POA: Diagnosis not present

## 2013-03-29 DIAGNOSIS — E538 Deficiency of other specified B group vitamins: Secondary | ICD-10-CM | POA: Diagnosis not present

## 2013-04-01 DIAGNOSIS — R109 Unspecified abdominal pain: Secondary | ICD-10-CM | POA: Diagnosis not present

## 2013-04-01 DIAGNOSIS — H2510 Age-related nuclear cataract, unspecified eye: Secondary | ICD-10-CM | POA: Diagnosis not present

## 2013-04-02 ENCOUNTER — Other Ambulatory Visit: Payer: Self-pay | Admitting: Internal Medicine

## 2013-04-02 DIAGNOSIS — R319 Hematuria, unspecified: Secondary | ICD-10-CM

## 2013-04-05 ENCOUNTER — Ambulatory Visit
Admission: RE | Admit: 2013-04-05 | Discharge: 2013-04-05 | Disposition: A | Discharge status: Home / Self Care | Payer: Medicare Other | Source: Ambulatory Visit | Attending: Internal Medicine | Admitting: Internal Medicine

## 2013-04-05 DIAGNOSIS — R319 Hematuria, unspecified: Secondary | ICD-10-CM

## 2013-04-05 DIAGNOSIS — N1330 Unspecified hydronephrosis: Secondary | ICD-10-CM | POA: Diagnosis not present

## 2013-04-05 DIAGNOSIS — R839 Unspecified abnormal finding in cerebrospinal fluid: Secondary | ICD-10-CM | POA: Diagnosis not present

## 2013-04-06 ENCOUNTER — Other Ambulatory Visit (HOSPITAL_COMMUNITY): Payer: Self-pay | Admitting: *Deleted

## 2013-04-06 ENCOUNTER — Encounter (HOSPITAL_COMMUNITY): Payer: Self-pay | Admitting: *Deleted

## 2013-04-06 ENCOUNTER — Other Ambulatory Visit: Payer: Self-pay | Admitting: Urology

## 2013-04-06 DIAGNOSIS — N200 Calculus of kidney: Secondary | ICD-10-CM | POA: Diagnosis not present

## 2013-04-06 DIAGNOSIS — N390 Urinary tract infection, site not specified: Secondary | ICD-10-CM | POA: Diagnosis not present

## 2013-04-06 HISTORY — DX: Calculus of kidney: N20.0

## 2013-04-07 ENCOUNTER — Encounter (HOSPITAL_COMMUNITY): Payer: Self-pay | Admitting: *Deleted

## 2013-04-07 ENCOUNTER — Encounter (HOSPITAL_COMMUNITY): Payer: Self-pay | Admitting: Registered Nurse

## 2013-04-07 ENCOUNTER — Ambulatory Visit (HOSPITAL_COMMUNITY)
Admission: RE | Admit: 2013-04-07 | Discharge: 2013-04-07 | Disposition: A | Discharge status: Home / Self Care | Payer: Medicare Other | Source: Ambulatory Visit | Attending: Urology | Admitting: Urology

## 2013-04-07 ENCOUNTER — Ambulatory Visit (HOSPITAL_COMMUNITY): Payer: Medicare Other

## 2013-04-07 ENCOUNTER — Ambulatory Visit (HOSPITAL_COMMUNITY): Payer: Medicare Other | Admitting: Registered Nurse

## 2013-04-07 ENCOUNTER — Encounter (HOSPITAL_COMMUNITY)
Admission: RE | Disposition: A | Discharge status: Home / Self Care | Payer: Self-pay | Source: Ambulatory Visit | Attending: Urology

## 2013-04-07 DIAGNOSIS — K219 Gastro-esophageal reflux disease without esophagitis: Secondary | ICD-10-CM | POA: Diagnosis not present

## 2013-04-07 DIAGNOSIS — Z853 Personal history of malignant neoplasm of breast: Secondary | ICD-10-CM | POA: Diagnosis not present

## 2013-04-07 DIAGNOSIS — Z01818 Encounter for other preprocedural examination: Secondary | ICD-10-CM | POA: Diagnosis not present

## 2013-04-07 DIAGNOSIS — N179 Acute kidney failure, unspecified: Secondary | ICD-10-CM | POA: Insufficient documentation

## 2013-04-07 DIAGNOSIS — E785 Hyperlipidemia, unspecified: Secondary | ICD-10-CM | POA: Insufficient documentation

## 2013-04-07 DIAGNOSIS — I10 Essential (primary) hypertension: Secondary | ICD-10-CM | POA: Insufficient documentation

## 2013-04-07 DIAGNOSIS — N3289 Other specified disorders of bladder: Secondary | ICD-10-CM | POA: Diagnosis not present

## 2013-04-07 DIAGNOSIS — D414 Neoplasm of uncertain behavior of bladder: Secondary | ICD-10-CM | POA: Diagnosis not present

## 2013-04-07 DIAGNOSIS — N201 Calculus of ureter: Secondary | ICD-10-CM | POA: Insufficient documentation

## 2013-04-07 DIAGNOSIS — N1330 Unspecified hydronephrosis: Secondary | ICD-10-CM | POA: Insufficient documentation

## 2013-04-07 DIAGNOSIS — Z90710 Acquired absence of both cervix and uterus: Secondary | ICD-10-CM | POA: Diagnosis not present

## 2013-04-07 DIAGNOSIS — C569 Malignant neoplasm of unspecified ovary: Secondary | ICD-10-CM | POA: Diagnosis not present

## 2013-04-07 DIAGNOSIS — Z9089 Acquired absence of other organs: Secondary | ICD-10-CM | POA: Insufficient documentation

## 2013-04-07 DIAGNOSIS — R82998 Other abnormal findings in urine: Secondary | ICD-10-CM | POA: Insufficient documentation

## 2013-04-07 DIAGNOSIS — K449 Diaphragmatic hernia without obstruction or gangrene: Secondary | ICD-10-CM | POA: Insufficient documentation

## 2013-04-07 HISTORY — PX: CYSTOSCOPY WITH STENT PLACEMENT: SHX5790

## 2013-04-07 HISTORY — PX: CYSTOSCOPY WITH BIOPSY: SHX5122

## 2013-04-07 HISTORY — DX: Cardiac murmur, unspecified: R01.1

## 2013-04-07 LAB — CBC
HCT: 33.7 % — ABNORMAL LOW (ref 36.0–46.0)
MCHC: 33.8 g/dL (ref 30.0–36.0)
MCV: 91.1 fL (ref 78.0–100.0)
Platelets: 280 10*3/uL (ref 150–400)
RDW: 12.8 % (ref 11.5–15.5)
WBC: 6.6 10*3/uL (ref 4.0–10.5)

## 2013-04-07 LAB — BASIC METABOLIC PANEL
BUN: 25 mg/dL — ABNORMAL HIGH (ref 6–23)
CO2: 24 mEq/L (ref 19–32)
Calcium: 9.3 mg/dL (ref 8.4–10.5)
Chloride: 106 mEq/L (ref 96–112)
Creatinine, Ser: 1.16 mg/dL — ABNORMAL HIGH (ref 0.50–1.10)
GFR calc Af Amer: 52 mL/min — ABNORMAL LOW (ref >90)

## 2013-04-07 SURGERY — CYSTOSCOPY, WITH STENT INSERTION
Anesthesia: General | Site: Bladder

## 2013-04-07 MED ORDER — OXYBUTYNIN CHLORIDE 5 MG PO TABS
ORAL_TABLET | ORAL | Status: AC
  Filled 2013-04-07: qty 1

## 2013-04-07 MED ORDER — PROMETHAZINE HCL 25 MG/ML IJ SOLN: 6.2500 mg | INTRAMUSCULAR | Status: DC | PRN

## 2013-04-07 MED ORDER — LACTATED RINGERS IV SOLN
INTRAVENOUS | Status: DC
  Administered 2013-04-07: 1000 mL via INTRAVENOUS

## 2013-04-07 MED ORDER — GENTAMICIN SULFATE 40 MG/ML IJ SOLN
5.0000 mg/kg | INTRAVENOUS | Status: AC
  Administered 2013-04-07: 340 mg via INTRAVENOUS
  Filled 2013-04-07: qty 8.5

## 2013-04-07 MED ORDER — LACTATED RINGERS IV SOLN
INTRAVENOUS | Status: DC | PRN
  Administered 2013-04-07: 17:00:00 via INTRAVENOUS

## 2013-04-07 MED ORDER — SODIUM CHLORIDE 0.9 % IR SOLN
Status: DC | PRN
  Administered 2013-04-07: 3000 mL

## 2013-04-07 MED ORDER — HYDRALAZINE HCL 20 MG/ML IJ SOLN
5.0000 mg | INTRAMUSCULAR | Status: AC | PRN
  Administered 2013-04-07 (×4): 5 mg via INTRAVENOUS

## 2013-04-07 MED ORDER — HYDRALAZINE HCL 20 MG/ML IJ SOLN
INTRAMUSCULAR | Status: AC
  Filled 2013-04-07: qty 1

## 2013-04-07 MED ORDER — STERILE WATER FOR IRRIGATION IR SOLN
Status: DC | PRN
  Administered 2013-04-07: 1000 mL

## 2013-04-07 MED ORDER — IOHEXOL 300 MG/ML  SOLN
INTRAMUSCULAR | Status: DC | PRN
  Administered 2013-04-07: 15 mL via URETHRAL

## 2013-04-07 MED ORDER — OXYBUTYNIN CHLORIDE 5 MG PO TABS: 5.0000 mg | ORAL_TABLET | Freq: Three times a day (TID) | ORAL | Status: DC | PRN

## 2013-04-07 MED ORDER — LACTATED RINGERS IV SOLN: INTRAVENOUS | Status: DC

## 2013-04-07 MED ORDER — OXYBUTYNIN CHLORIDE 5 MG PO TABS
5.0000 mg | ORAL_TABLET | Freq: Three times a day (TID) | ORAL | Status: DC | PRN
  Administered 2013-04-07: 5 mg via ORAL

## 2013-04-07 MED ORDER — IOHEXOL 300 MG/ML  SOLN
INTRAMUSCULAR | Status: AC
  Filled 2013-04-07: qty 1

## 2013-04-07 MED ORDER — ONDANSETRON HCL 4 MG/2ML IJ SOLN
INTRAMUSCULAR | Status: DC | PRN
  Administered 2013-04-07: 4 mg via INTRAVENOUS

## 2013-04-07 MED ORDER — PROPOFOL 10 MG/ML IV BOLUS
INTRAVENOUS | Status: DC | PRN
  Administered 2013-04-07: 150 mg via INTRAVENOUS

## 2013-04-07 MED ORDER — FENTANYL CITRATE 0.05 MG/ML IJ SOLN
INTRAMUSCULAR | Status: DC | PRN
  Administered 2013-04-07 (×2): 50 ug via INTRAVENOUS

## 2013-04-07 MED ORDER — NEOSTIGMINE METHYLSULFATE 1 MG/ML IJ SOLN
INTRAMUSCULAR | Status: DC | PRN
  Administered 2013-04-07: 4 mg via INTRAVENOUS

## 2013-04-07 MED ORDER — FENTANYL CITRATE 0.05 MG/ML IJ SOLN: 25.0000 ug | INTRAMUSCULAR | Status: DC | PRN

## 2013-04-07 MED ORDER — LIDOCAINE HCL (PF) 2 % IJ SOLN
INTRAMUSCULAR | Status: DC | PRN
  Administered 2013-04-07: 50 mg

## 2013-04-07 MED ORDER — TRAMADOL HCL 50 MG PO TABS: 50.0000 mg | ORAL_TABLET | Freq: Four times a day (QID) | ORAL | Status: DC | PRN

## 2013-04-07 MED ORDER — CIPROFLOXACIN HCL 250 MG PO TABS: 250.0000 mg | ORAL_TABLET | Freq: Two times a day (BID) | ORAL | Status: DC

## 2013-04-07 MED ORDER — SENNA-DOCUSATE SODIUM 8.6-50 MG PO TABS: 1.0000 | ORAL_TABLET | Freq: Two times a day (BID) | ORAL | Status: DC

## 2013-04-07 MED ORDER — GLYCOPYRROLATE 0.2 MG/ML IJ SOLN
INTRAMUSCULAR | Status: DC | PRN
  Administered 2013-04-07: 0.6 mg via INTRAVENOUS

## 2013-04-07 MED ORDER — ROCURONIUM BROMIDE 100 MG/10ML IV SOLN
INTRAVENOUS | Status: DC | PRN
  Administered 2013-04-07: 30 mg via INTRAVENOUS
  Administered 2013-04-07: 10 mg via INTRAVENOUS

## 2013-04-07 SURGICAL SUPPLY — 15 items
ADAPTER CATH URET PLST 4-6FR (CATHETERS) IMPLANT
BAG URO CATCHER STRL LF (DRAPE) ×3 IMPLANT
BASKET ZERO TIP NITINOL 2.4FR (BASKET) IMPLANT
CATH INTERMIT  6FR 70CM (CATHETERS) IMPLANT
CLOTH BEACON ORANGE TIMEOUT ST (SAFETY) ×3 IMPLANT
DRAPE CAMERA CLOSED 9X96 (DRAPES) ×3 IMPLANT
GLOVE BIOGEL M STRL SZ7.5 (GLOVE) ×9 IMPLANT
GOWN STRL NON-REIN LRG LVL3 (GOWN DISPOSABLE) IMPLANT
GOWN STRL REIN XL XLG (GOWN DISPOSABLE) ×6 IMPLANT
GUIDEWIRE ANG ZIPWIRE 038X150 (WIRE) ×3 IMPLANT
GUIDEWIRE STR DUAL SENSOR (WIRE) ×3 IMPLANT
MANIFOLD NEPTUNE II (INSTRUMENTS) ×3 IMPLANT
PACK CYSTO (CUSTOM PROCEDURE TRAY) ×3 IMPLANT
STENT CONTOUR 6FRX24X.038 (STENTS) ×6 IMPLANT
TUBING CONNECTING 10 (TUBING) ×3 IMPLANT

## 2013-04-07 NOTE — Preoperative (Signed)
Beta Blockers   Reason not to administer Beta Blockers:Took Coreg this am. 

## 2013-04-07 NOTE — Brief Op Note (Signed)
04/07/2013  5:50 PM  PATIENT:  [name]  79 y.o. female  PRE-OPERATIVE DIAGNOSIS:  Bilateral Ureteral Stones, Bacteria  POST-OPERATIVE DIAGNOSIS:  bilateral ureteral stones, bacteria  PROCEDURE:  Procedure(s): CYSTOSCOPY WITH STENT PLACEMENT (Bilateral) CYSTOSCOPY WITH BIOPSY and fulgeration (N/A)  SURGEON:  Surgeon(s) and Role:    * Sebastian Ache, MD - Primary  PHYSICIAN ASSISTANT:   ASSISTANTS: none   ANESTHESIA:   general  EBL:     BLOOD ADMINISTERED:none  DRAINS: none   LOCAL MEDICATIONS USED:  NONE  SPECIMEN:  Source of Specimen:  Rt Lateral Bladder Wall Mass Biopsy  DISPOSITION OF SPECIMEN:  PATHOLOGY  COUNTS:  YES  TOURNIQUET:  * No tourniquets in log *  DICTATION: .Other Dictation: Dictation Number [id number]  PLAN OF CARE: Discharge to home after PACU  PATIENT DISPOSITION:  PACU - hemodynamically stable.   Delay start of Pharmacological VTE agent (>24hrs) due to surgical blood loss or risk of bleeding: not applicable

## 2013-04-07 NOTE — Progress Notes (Signed)
Dr Laverle Patter in unit and aware of pt's urge to void, but unable to void.  Orders received.  Bladder scan 100 mls. Dr Laverle Patter ordered foley removed and pt instructed to return to ER if unable to void thru night or pain/urge increases.  Also, instructed to call office with any questions.

## 2013-04-07 NOTE — Anesthesia Preprocedure Evaluation (Signed)
Anesthesia Evaluation  Patient identified by MRN, date of birth, ID band Patient awake    Reviewed: Allergy & Precautions, H&P , NPO status , Patient's Chart, lab work & pertinent test results  Airway Mallampati: I TM Distance: >3 FB Neck ROM: full    Dental  (+) Teeth Intact and Dental Advidsory Given   Pulmonary neg pulmonary ROS,  breath sounds clear to auscultation  Pulmonary exam normal       Cardiovascular hypertension, On Medications - angina+ Valvular Problems/Murmurs Rhythm:regular Rate:Normal     Neuro/Psych    GI/Hepatic Neg liver ROS, GERD-  Medicated,  Endo/Other  negative endocrine ROS  Renal/GU negative Renal ROS     Musculoskeletal   Abdominal Normal abdominal exam  (+)   Peds  Hematology   Anesthesia Other Findings   Reproductive/Obstetrics                           Anesthesia Physical Anesthesia Plan  ASA: II  Anesthesia Plan: General   Post-op Pain Management:    Induction: Intravenous  Airway Management Planned: LMA  Additional Equipment:   Intra-op Plan:   Post-operative Plan: Extubation in OR  Informed Consent: I have reviewed the patients History and Physical, chart, labs and discussed the procedure including the risks, benefits and alternatives for the proposed anesthesia with the patient or authorized representative who has indicated his/her understanding and acceptance.   Dental advisory given  Plan Discussed with: CRNA  Anesthesia Plan Comments:         Anesthesia Quick Evaluation  

## 2013-04-07 NOTE — H&P (Signed)
[name] is an 79 y.o. female.    Chief Complaint: Pre-Op Cysto and Bilateral Ureteral Stent Placement  HPI:    1 - Bilateral Ureteral Stones - pt with bilteral uretral stones (Lt 5mm distal, Rt 4mm UVJ) with mild hydro and small volume intrarenal stones on w/u for flank pain by CT. No prior episodes. Cr in office 7/1 1.5 up from baseline of 1.0. Making good urine clinically.  2 - Bacteruria - Pt wtih bacteruria noted at PCP 6/28 with multiple organisms including pan-sensitive e. coli. No fever / chills. Has been on Cipro x 2 day. No fever/chills.  PMH sig for chole, hyst, colon resection, "slow growing" but progressive ovarian cancer folowed by Dr. Loree Fee.  Today [name] is seen to proceed with cysto and bilateral ureteral stent placement. No interval fevers.  Past Medical History  Diagnosis Date  . Hypertension   . Allergy   . Blood transfusion   . Hyperlipidemia   . GERD (gastroesophageal reflux disease)   . Heart murmur   . Breast cancer     right / ovarian cancer / skin cancer removed  . Kidney stone   . UTI (lower urinary tract infection)     Past Surgical History  Procedure Laterality Date  . Total abdominal hysterectomy w/ bilateral salpingoophorectomy  1976  . Cholecystectomy    . Bowel resection  2009    colon resection-i;iostomy-then reversal  . Breast lumpectomy      2010- malignant, 6-8 total  . Appendectomy    . Abdominal hysterectomy    . Wound debridement  05/28/2012    Procedure: DEBRIDEMENT ABDOMINAL WOUND;  Surgeon: Currie Paris, MD;  Location: [facility];  Service: General;  Laterality: N/A;  excision chronic wound abdominal wall    Family History  Problem Relation Age of Onset  . Breast cancer Sister   . Cancer Sister     breast  . Heart disease Mother    Social History:  reports that she has never smoked. She does not have any smokeless tobacco history on file. She reports that she does not drink alcohol or use  illicit drugs.  Allergies: No Known Allergies  No prescriptions prior to admission    No results found for this or any previous visit (from the past 48 hour(s)). Ct Abdomen Pelvis Wo Contrast  04/05/2013   *RADIOLOGY REPORT*  Clinical Data: Hematuria.  Flank pain.  CT ABDOMEN AND PELVIS WITHOUT CONTRAST  Technique:  Multidetector CT imaging of the abdomen and pelvis was performed following the standard protocol without intravenous contrast.  Comparison: CT of the abdomen and pelvis 01/29/2012.  Findings:  Lung Bases: Atherosclerotic calcifications in the left anterior descending, left circumflex and right coronary arteries. Calcifications of the mitral annulus and aortic valve.  Small hiatal hernia.  Abdomen/Pelvis:  Images 77 and 78 demonstrate a 4 mm calculus at the right ureterovesicular junction.  This is associated with mild right-sided hydroureteronephrosis.  In addition, image 75 of series 2 demonstrates a 5 mm stone in the distal third of the left ureter shortly before the left ureterovesicular junction.  There is no associated hydroureteronephrosis associated with this stone. Multiple additional non obstructive calculi are noted within the collecting systems of the kidneys bilaterally, ranging in size from 2-5 mm.  Status post cholecystectomy.  The liver has a slightly shrunken appearance and slight nodular contour, suggestive of early changes of cirrhosis.  Capsular calcification overlying segment 4A of the liver is nonspecific.  No  definite focal hepatic lesions are identified on today's noncontrast CT examination.  The unenhanced appearance of the pancreas, spleen and bilateral adrenal glands is unremarkable.  Extensive atherosclerosis throughout the abdominal and pelvic vasculature, without definite aneurysm.  No significant volume of ascites.  Postoperative changes of right hemicolectomy are noted.  There is some apparent wall thickening throughout the rectum and sigmoid colon with probable  mild hypervascularity throughout the associated sigmoid mesocolon.  No pneumoperitoneum. No pathologic distension of small bowel.  Status post hysterectomy. Ovaries are not confidently identified may be surgically absent or atrophic.  Musculoskeletal: There are no aggressive appearing lytic or blastic lesions noted in the visualized portions of the skeleton.  IMPRESSION: 1.  Multiple nonobstructive calculi within the collecting systems of the kidneys bilaterally, 5 mm stone in the distal third of the left ureter (shortly before the left ureterovesicular junction), which appears to be nonobstructive, and a partially obstructive 4 mm calculus at the right ureterovesicular junction with mild right- sided hydroureteronephrosis.  2.  Thickening of the rectosigmoid colon with probable hypervascularity in the associated mesocolon.  Clinical correlation for signs and symptoms of proctocolitis is recommended. 3. Atherosclerosis, including three-vessel coronary artery disease. Assessment for potential risk factor modification, dietary therapy or pharmacologic therapy may be warranted, if clinically indicated. 4.  Morphologic changes in the liver suggestive of early cirrhosis, as above. 5.  Status post right hemicolectomy, cholecystectomy and hysterectomy.  This was made a call report.   Original Report Authenticated By: Trudie Reed, M.D.    Review of Systems  Constitutional: Negative.  Negative for fever and chills.  HENT: Negative.   Eyes: Negative.   Respiratory: Negative.   Cardiovascular: Negative.   Gastrointestinal: Negative.   Genitourinary: Negative.   Musculoskeletal: Negative.   Skin: Negative.   Neurological: Negative.   Endo/Heme/Allergies: Negative.   Psychiatric/Behavioral: Negative.     Height 5\' 8"  (1.727 m), weight 68.04 kg (150 lb). Physical Exam  Constitutional: She is oriented to person, place, and time. She appears well-developed and well-nourished.  HENT:  Head: Normocephalic  and atraumatic.  Eyes: EOM are normal. Pupils are equal, round, and reactive to light.  Neck: Normal range of motion. Neck supple.  Cardiovascular: Normal rate and regular rhythm.   Respiratory: Effort normal.  GI: Soft. Bowel sounds are normal.  Genitourinary:  Minimal CVAT  Musculoskeletal: Normal range of motion.  Neurological: She is alert and oriented to person, place, and time.  Skin: Skin is warm and dry.  Psychiatric: She has a normal mood and affect. Her behavior is normal. Judgment and thought content normal.     Assessment/Plan  1 - Bilateral Ureteral Stones - Situation worrisoem for impending bilateral complete obstruction. As bacteruria present would elect staged approach with bilateral ureteral stenting followed by bilateral ureteroscopic stone manipulation few weeks later. GFR in safe range though appears soewhat compromised. Will proceed with cysto + stents today to allow renal decompression.   We discussed risks including bleeding, infection, damage to kidney / ureter  bladder, rarely loss of kidney. We discussed anesthetic risks and rare but serious surgical complications including DVT, PE, MI, and mortality. The patient voiced understanding and wises to proceed.   2 - Bacteruria - On appropriate therapy.   Somalia Segler 04/07/2013, 7:26 AM

## 2013-04-07 NOTE — Transfer of Care (Signed)
Immediate Anesthesia Transfer of Care Note  Patient: [name]  Procedure(s) Performed: Procedure(s): CYSTOSCOPY WITH STENT PLACEMENT (Bilateral) CYSTOSCOPY WITH BIOPSY and fulgeration (N/A)  Patient Location: PACU  Anesthesia Type:General  Level of Consciousness: awake, alert , oriented and patient cooperative  Airway & Oxygen Therapy: Patient Spontanous Breathing and Patient connected to face mask oxygen  Post-op Assessment: Report given to PACU RN, Post -op Vital signs reviewed and stable and Patient moving all extremities X 4  Post vital signs: Reviewed and stable  Complications: No apparent anesthesia complications

## 2013-04-08 ENCOUNTER — Encounter (HOSPITAL_COMMUNITY): Payer: Self-pay | Admitting: Urology

## 2013-04-08 DIAGNOSIS — N3020 Other chronic cystitis without hematuria: Secondary | ICD-10-CM | POA: Diagnosis not present

## 2013-04-08 DIAGNOSIS — D414 Neoplasm of uncertain behavior of bladder: Secondary | ICD-10-CM | POA: Diagnosis not present

## 2013-04-08 NOTE — Op Note (Signed)
[name], Alison NO.:  [id number]  MEDICAL RECORD NO.:  [id number]  LOCATION:  [facility]                         FACILITY:  [facility]  PHYSICIAN:  Sebastian Ache, MD     DATE OF BIRTH:  02-Aug-1934  DATE OF PROCEDURE:  04/07/2013 DATE OF DISCHARGE:  04/07/2013                              OPERATIVE REPORT   DIAGNOSES:  Bilateral ureteral stones, acute renal failure, bacteriuria, right lateral bladder wall mass.  PROCEDURE: 1. Cystoscopy with bilateral retrograde pyelograms interpretation. 2. Placement of bilateral ureteral stents, 6 x 24, no tether. 3. Bladder biopsy with fulguration.  FINDINGS: 1. Bilateral ureteral filling defect consistent with known stone. 2. Unexpected right lateral bladder wall, papillary appearing mass     approximately 2-3 cm in diameter. 3. Worrisome papular erythema of the trigone extending across the right     ureteral orifice towards the midline.  COMPLICATIONS:  None.  SPECIMEN:  Right lateral bladder wall biopsy and sent to pathology.  ESTIMATED BLOOD LOSS:  Nil.  DRAINS:  None.  INDICATION:  [name] is a very vigorous and pleasant 79 year old lady with recent history of colicky, abdominal and flank pain.  She was found on workup of this to have bilateral ureteral stones.  She was referred urgently for urologic evaluation.  She was found to have a modest rise in her creatinine from baseline of 1 up to 1.5.  She also had a concomitant bacteriuria and was on appropriate antibiotic therapy. She remained afebrile and without malaise.  Options were discussed including urgent ureteral stenting versus ureteral stenting the next day, being today and she wished to proceed with the latter.  Informed consent was obtained and placed in medical record.  PROCEDURE IN DETAIL:  The patient being [name], procedure being cystoscopy and bilateral retrograde pyelogram, and bilateral ureteral stents was confirmed.  Procedure was  carried out.  Time-out was performed.  Intravenous antibiotics were administered.  General LMA anesthesia was introduced.  The patient was placed into a low lithotomy position.  Sterile field was created by prepping the patient's vagina, introitus, and proximal thighs using iodine x3.  Next, cystourethroscopy was performed.  A 22-French cystoscope with 12-degree offset lens. Inspection of urinary bladder revealed an expected papillary lesion in the right lateral wall.  This appeared somewhat sessile and papillary as well.  This approximately 2 cm lateral to the right ureteral orifice. This is approximately 2-3 cm in diameter.  There was also worrisome papular erythema that extended across the right ureteral orifice towards the midline of the trigone.  There were no additional bladder lesions. Attention was then directed to the left retrograde pyelogram.  Left ureteral orifice was cannulated using a 6-French end-hole catheter and left retrograde pyelogram seen.  Left retrograde pyelogram demonstrated a single left ureter, single system left kidney.  There was a single filling defect in the ureter consistent with known stone.  A 0.038 Glidewire was advanced to the level of the upper pole, over which a new 6 x 24 double-J stent was carefully placed using cystoscopic and fluoroscopic guidance.  Good proximal and distal curl were noted.  Attention was directed to the right side.  The right ureteral  orifice was identified within the spectrum and papular erythema.  Cannulated using a 6-French end-hole catheter and right retrograde pyelogram was obtained.  Right retrograde pyelogram demonstrated a single right ureter, single system, right kidney.  There was mild hydroureteronephrosis.  There was a filling defect in the proximal ureter consistent with known stone. This apparently back last towards the area of the kidney with retrograde pyelogram.  A 0.038 Glidewire was advanced at the level of  the upper pole over which a new 6 x 24 double-J stent was placed on the right side using cystoscopic and fluoroscopic guidance.  Good proximal and distal curl were noted.  Efflux of clear appearing urine was seen from bilateral distal stents.  There was worrisome bladder mass relative lack of overt infectious parameters today.  Decision was made to proceed with biopsy of this bladder mass.  It was somewhat concerning for carcinoma. Using cold cup biopsy forceps, 2 representative sections were taken from the most worrisome area and set aside for permanent pathology. Hemostasis was achieved, and fulguration with Bugbee electrode using the bladder irrigation under these biopsy and fulguration portion.  Final inspection of the urinary bladder revealed excellent hemostasis.  No evidence of bladder perforation.  Bladder was emptied per cystoscope. Procedure was then terminated.  The patient tolerated the procedure well.  There were no immediate periprocedural complications.  The patient was taken to postanesthesia care unit in stable condition.          ______________________________ Sebastian Ache, MD     TM/MEDQ  D:  04/07/2013  T:  04/08/2013  Job:  [id number]

## 2013-04-12 NOTE — Anesthesia Postprocedure Evaluation (Signed)
Anesthesia Post Note  Patient: [name]  Procedure(s) Performed: Procedure(s) (LRB): CYSTOSCOPY WITH STENT PLACEMENT (Bilateral) CYSTOSCOPY WITH BIOPSY and fulgeration (N/A)  Anesthesia type: General  Patient location: PACU  Post pain: Pain level controlled  Post assessment: Post-op Vital signs reviewed  Last Vitals:  Filed Vitals:   04/07/13 2025  BP: 167/58  Pulse: 62  Temp:   Resp: 20    Post vital signs: Reviewed  Level of consciousness: sedated  Complications: No apparent anesthesia complications

## 2013-04-16 DIAGNOSIS — H2510 Age-related nuclear cataract, unspecified eye: Secondary | ICD-10-CM | POA: Diagnosis not present

## 2013-04-22 DIAGNOSIS — E538 Deficiency of other specified B group vitamins: Secondary | ICD-10-CM | POA: Diagnosis not present

## 2013-04-22 DIAGNOSIS — Z0000 Encounter for general adult medical examination without abnormal findings: Secondary | ICD-10-CM | POA: Diagnosis not present

## 2013-04-22 DIAGNOSIS — I2510 Atherosclerotic heart disease of native coronary artery without angina pectoris: Secondary | ICD-10-CM | POA: Diagnosis not present

## 2013-04-22 DIAGNOSIS — N200 Calculus of kidney: Secondary | ICD-10-CM | POA: Diagnosis not present

## 2013-04-22 DIAGNOSIS — E7800 Pure hypercholesterolemia, unspecified: Secondary | ICD-10-CM | POA: Diagnosis not present

## 2013-04-22 DIAGNOSIS — N390 Urinary tract infection, site not specified: Secondary | ICD-10-CM | POA: Diagnosis not present

## 2013-04-22 DIAGNOSIS — I10 Essential (primary) hypertension: Secondary | ICD-10-CM | POA: Diagnosis not present

## 2013-04-23 ENCOUNTER — Other Ambulatory Visit: Payer: Self-pay | Admitting: Urology

## 2013-04-26 DIAGNOSIS — H2510 Age-related nuclear cataract, unspecified eye: Secondary | ICD-10-CM | POA: Diagnosis not present

## 2013-04-26 DIAGNOSIS — H269 Unspecified cataract: Secondary | ICD-10-CM | POA: Diagnosis not present

## 2013-04-26 DIAGNOSIS — H2589 Other age-related cataract: Secondary | ICD-10-CM | POA: Diagnosis not present

## 2013-04-26 HISTORY — PX: CATARACT EXTRACTION W/ INTRAOCULAR LENS IMPLANT: SHX1309

## 2013-04-28 ENCOUNTER — Encounter (HOSPITAL_BASED_OUTPATIENT_CLINIC_OR_DEPARTMENT_OTHER): Payer: Self-pay | Admitting: *Deleted

## 2013-04-29 ENCOUNTER — Encounter (HOSPITAL_BASED_OUTPATIENT_CLINIC_OR_DEPARTMENT_OTHER): Payer: Self-pay | Admitting: *Deleted

## 2013-04-29 DIAGNOSIS — L820 Inflamed seborrheic keratosis: Secondary | ICD-10-CM | POA: Diagnosis not present

## 2013-04-29 DIAGNOSIS — Z85828 Personal history of other malignant neoplasm of skin: Secondary | ICD-10-CM | POA: Diagnosis not present

## 2013-04-29 DIAGNOSIS — E538 Deficiency of other specified B group vitamins: Secondary | ICD-10-CM | POA: Diagnosis not present

## 2013-04-29 DIAGNOSIS — L821 Other seborrheic keratosis: Secondary | ICD-10-CM | POA: Diagnosis not present

## 2013-04-29 DIAGNOSIS — L919 Hypertrophic disorder of the skin, unspecified: Secondary | ICD-10-CM | POA: Diagnosis not present

## 2013-04-30 ENCOUNTER — Telehealth: Payer: Self-pay | Admitting: *Deleted

## 2013-05-03 ENCOUNTER — Encounter (HOSPITAL_BASED_OUTPATIENT_CLINIC_OR_DEPARTMENT_OTHER): Payer: Self-pay | Admitting: *Deleted

## 2013-05-03 NOTE — Progress Notes (Signed)
NPO AFTER MN. ARRIVES AT 0830. NEEDS ISTAT 8. CURRENT EKG IN EPIC AND CHART. WILL TAKE COREG AND NEXIUM AM OF SURG W/ SIP OF WATER.

## 2013-05-07 ENCOUNTER — Ambulatory Visit (HOSPITAL_BASED_OUTPATIENT_CLINIC_OR_DEPARTMENT_OTHER)
Admission: RE | Admit: 2013-05-07 | Discharge: 2013-05-07 | Disposition: A | Discharge status: Home / Self Care | Payer: Medicare Other | Source: Ambulatory Visit | Attending: Urology | Admitting: Urology

## 2013-05-07 ENCOUNTER — Encounter (HOSPITAL_BASED_OUTPATIENT_CLINIC_OR_DEPARTMENT_OTHER): Payer: Self-pay

## 2013-05-07 ENCOUNTER — Ambulatory Visit (HOSPITAL_BASED_OUTPATIENT_CLINIC_OR_DEPARTMENT_OTHER): Payer: Medicare Other | Admitting: Anesthesiology

## 2013-05-07 ENCOUNTER — Ambulatory Visit (HOSPITAL_COMMUNITY): Payer: Medicare Other

## 2013-05-07 ENCOUNTER — Encounter (HOSPITAL_BASED_OUTPATIENT_CLINIC_OR_DEPARTMENT_OTHER): Payer: Self-pay | Admitting: Anesthesiology

## 2013-05-07 ENCOUNTER — Encounter (HOSPITAL_BASED_OUTPATIENT_CLINIC_OR_DEPARTMENT_OTHER)
Admission: RE | Disposition: A | Discharge status: Home / Self Care | Payer: Self-pay | Source: Ambulatory Visit | Attending: Urology

## 2013-05-07 DIAGNOSIS — N329 Bladder disorder, unspecified: Secondary | ICD-10-CM | POA: Insufficient documentation

## 2013-05-07 DIAGNOSIS — N201 Calculus of ureter: Secondary | ICD-10-CM | POA: Insufficient documentation

## 2013-05-07 DIAGNOSIS — Z79899 Other long term (current) drug therapy: Secondary | ICD-10-CM | POA: Diagnosis not present

## 2013-05-07 DIAGNOSIS — Z853 Personal history of malignant neoplasm of breast: Secondary | ICD-10-CM | POA: Diagnosis not present

## 2013-05-07 DIAGNOSIS — Z803 Family history of malignant neoplasm of breast: Secondary | ICD-10-CM | POA: Diagnosis not present

## 2013-05-07 DIAGNOSIS — Z85828 Personal history of other malignant neoplasm of skin: Secondary | ICD-10-CM | POA: Insufficient documentation

## 2013-05-07 DIAGNOSIS — E785 Hyperlipidemia, unspecified: Secondary | ICD-10-CM | POA: Insufficient documentation

## 2013-05-07 DIAGNOSIS — I10 Essential (primary) hypertension: Secondary | ICD-10-CM | POA: Insufficient documentation

## 2013-05-07 DIAGNOSIS — R82998 Other abnormal findings in urine: Secondary | ICD-10-CM | POA: Diagnosis not present

## 2013-05-07 DIAGNOSIS — K219 Gastro-esophageal reflux disease without esophagitis: Secondary | ICD-10-CM | POA: Diagnosis not present

## 2013-05-07 DIAGNOSIS — Z9049 Acquired absence of other specified parts of digestive tract: Secondary | ICD-10-CM | POA: Insufficient documentation

## 2013-05-07 DIAGNOSIS — N200 Calculus of kidney: Secondary | ICD-10-CM | POA: Insufficient documentation

## 2013-05-07 DIAGNOSIS — Z8544 Personal history of malignant neoplasm of other female genital organs: Secondary | ICD-10-CM | POA: Diagnosis not present

## 2013-05-07 DIAGNOSIS — R011 Cardiac murmur, unspecified: Secondary | ICD-10-CM | POA: Insufficient documentation

## 2013-05-07 HISTORY — DX: Nocturia: R35.1

## 2013-05-07 HISTORY — PX: CYSTOSCOPY WITH RETROGRADE PYELOGRAM, URETEROSCOPY AND STENT PLACEMENT: SHX5789

## 2013-05-07 HISTORY — PX: CYSTOSCOPY W/ URETERAL STENT PLACEMENT: SHX1429

## 2013-05-07 HISTORY — DX: Dysuria: R30.0

## 2013-05-07 HISTORY — DX: Calculus of ureter: N20.1

## 2013-05-07 HISTORY — DX: Personal history of other malignant neoplasm of skin: Z85.828

## 2013-05-07 HISTORY — DX: Personal history of malignant neoplasm of ovary: Z85.43

## 2013-05-07 HISTORY — DX: Personal history of malignant neoplasm of breast: Z85.3

## 2013-05-07 LAB — POCT I-STAT, CHEM 8
BUN: 24 mg/dL — ABNORMAL HIGH (ref 6–23)
Chloride: 109 mEq/L (ref 96–112)
Potassium: 4.3 mEq/L (ref 3.5–5.1)
Sodium: 140 mEq/L (ref 135–145)

## 2013-05-07 SURGERY — CYSTOURETEROSCOPY, WITH RETROGRADE PYELOGRAM AND STENT INSERTION
Anesthesia: General | Site: Ureter | Laterality: Bilateral | Wound class: Clean Contaminated

## 2013-05-07 MED ORDER — FENTANYL CITRATE 0.05 MG/ML IJ SOLN
INTRAMUSCULAR | Status: DC | PRN
  Administered 2013-05-07: 50 ug via INTRAVENOUS
  Administered 2013-05-07 (×2): 25 ug via INTRAVENOUS

## 2013-05-07 MED ORDER — LIDOCAINE HCL (CARDIAC) 20 MG/ML IV SOLN
INTRAVENOUS | Status: DC | PRN
  Administered 2013-05-07: 60 mg via INTRAVENOUS

## 2013-05-07 MED ORDER — KETOROLAC TROMETHAMINE 30 MG/ML IJ SOLN
INTRAMUSCULAR | Status: DC | PRN
  Administered 2013-05-07: 15 mg via INTRAVENOUS

## 2013-05-07 MED ORDER — DEXAMETHASONE SODIUM PHOSPHATE 4 MG/ML IJ SOLN
INTRAMUSCULAR | Status: DC | PRN
  Administered 2013-05-07: 10 mg via INTRAVENOUS

## 2013-05-07 MED ORDER — IOHEXOL 350 MG/ML SOLN
INTRAVENOUS | Status: DC | PRN
  Administered 2013-05-07: 25 mL

## 2013-05-07 MED ORDER — GENTAMICIN IN SALINE 1.6-0.9 MG/ML-% IV SOLN
80.0000 mg | INTRAVENOUS | Status: DC
  Filled 2013-05-07: qty 50

## 2013-05-07 MED ORDER — LACTATED RINGERS IV SOLN
INTRAVENOUS | Status: DC
  Administered 2013-05-07: 09:00:00 via INTRAVENOUS
  Filled 2013-05-07: qty 1000

## 2013-05-07 MED ORDER — LACTATED RINGERS IV SOLN
INTRAVENOUS | Status: DC
  Administered 2013-05-07: 12:00:00 via INTRAVENOUS
  Filled 2013-05-07: qty 1000

## 2013-05-07 MED ORDER — PROMETHAZINE HCL 25 MG/ML IJ SOLN
6.2500 mg | INTRAMUSCULAR | Status: DC | PRN
  Filled 2013-05-07: qty 1

## 2013-05-07 MED ORDER — SENNOSIDES-DOCUSATE SODIUM 8.6-50 MG PO TABS: 1.0000 | ORAL_TABLET | Freq: Two times a day (BID) | ORAL | Status: DC

## 2013-05-07 MED ORDER — GLYCOPYRROLATE 0.2 MG/ML IJ SOLN
INTRAMUSCULAR | Status: DC | PRN
  Administered 2013-05-07: 0.2 mg via INTRAVENOUS

## 2013-05-07 MED ORDER — SULFAMETHOXAZOLE-TMP DS 800-160 MG PO TABS: 1.0000 | ORAL_TABLET | Freq: Every day | ORAL | Status: DC

## 2013-05-07 MED ORDER — SODIUM CHLORIDE 0.9 % IR SOLN
Status: DC | PRN
  Administered 2013-05-07: 6000 mL

## 2013-05-07 MED ORDER — TRAMADOL HCL 50 MG PO TABS: 50.0000 mg | ORAL_TABLET | Freq: Four times a day (QID) | ORAL | Status: DC | PRN

## 2013-05-07 MED ORDER — FENTANYL CITRATE 0.05 MG/ML IJ SOLN
25.0000 ug | INTRAMUSCULAR | Status: DC | PRN
  Filled 2013-05-07: qty 1

## 2013-05-07 MED ORDER — GENTAMICIN SULFATE 40 MG/ML IJ SOLN
320.0000 mg | Freq: Once | INTRAVENOUS | Status: AC
  Administered 2013-05-07: 320 mg via INTRAVENOUS
  Filled 2013-05-07: qty 8

## 2013-05-07 MED ORDER — OXYBUTYNIN CHLORIDE 5 MG PO TABS: 5.0000 mg | ORAL_TABLET | Freq: Three times a day (TID) | ORAL | Status: DC | PRN

## 2013-05-07 MED ORDER — PROPOFOL 10 MG/ML IV BOLUS
INTRAVENOUS | Status: DC | PRN
  Administered 2013-05-07: 120 mg via INTRAVENOUS

## 2013-05-07 MED ORDER — ONDANSETRON HCL 4 MG/2ML IJ SOLN
INTRAMUSCULAR | Status: DC | PRN
  Administered 2013-05-07: 4 mg via INTRAVENOUS

## 2013-05-07 SURGICAL SUPPLY — 40 items
ADAPTER CATH URET PLST 4-6FR (CATHETERS) IMPLANT
BAG DRAIN URO-CYSTO SKYTR STRL (DRAIN) ×3 IMPLANT
BAG URO CATCHER STRL LF (DRAPE) ×3 IMPLANT
BASKET LASER NITINOL 1.9FR (BASKET) ×3 IMPLANT
BASKET STNLS GEMINI 4WIRE 3FR (BASKET) IMPLANT
BASKET ZERO TIP NITINOL 2.4FR (BASKET) IMPLANT
BRUSH URET BIOPSY 3F (UROLOGICAL SUPPLIES) IMPLANT
CANISTER SUCT LVC 12 LTR MEDI- (MISCELLANEOUS) ×3 IMPLANT
CATH FOLEY 2WAY  3CC  8FR (CATHETERS)
CATH FOLEY 2WAY 3CC 8FR (CATHETERS) IMPLANT
CATH INTERMIT  6FR 70CM (CATHETERS) IMPLANT
CATH URET 5FR 28IN CONE TIP (BALLOONS)
CATH URET 5FR 28IN OPEN ENDED (CATHETERS) IMPLANT
CATH URET 5FR 70CM CONE TIP (BALLOONS) IMPLANT
CLOTH BEACON ORANGE TIMEOUT ST (SAFETY) ×3 IMPLANT
DRAPE CAMERA CLOSED 9X96 (DRAPES) ×3 IMPLANT
ELECT REM PT RETURN 9FT ADLT (ELECTROSURGICAL)
ELECTRODE REM PT RTRN 9FT ADLT (ELECTROSURGICAL) IMPLANT
GLOVE BIO SURGEON STRL SZ7.5 (GLOVE) ×3 IMPLANT
GLOVE BIOGEL PI IND STRL 6.5 (GLOVE) ×4 IMPLANT
GLOVE BIOGEL PI INDICATOR 6.5 (GLOVE) ×2
GLOVE ECLIPSE 6.5 STRL STRAW (GLOVE) ×3 IMPLANT
GOWN PREVENTION PLUS LG XLONG (DISPOSABLE) ×6 IMPLANT
GOWN STRL REIN XL XLG (GOWN DISPOSABLE) IMPLANT
GUIDEWIRE 0.038 PTFE COATED (WIRE) IMPLANT
GUIDEWIRE ANG ZIPWIRE 038X150 (WIRE) ×3 IMPLANT
GUIDEWIRE STR DUAL SENSOR (WIRE) ×3 IMPLANT
IV NS IRRIG 3000ML ARTHROMATIC (IV SOLUTION) ×6 IMPLANT
KIT BALLIN UROMAX 15FX10 (LABEL) IMPLANT
KIT BALLN UROMAX 15FX4 (MISCELLANEOUS) IMPLANT
KIT BALLN UROMAX 26 75X4 (MISCELLANEOUS)
PACK CYSTOSCOPY (CUSTOM PROCEDURE TRAY) ×3 IMPLANT
SET HIGH PRES BAL DIL (LABEL)
SHEATH ACCESS URETERAL 24CM (SHEATH) ×3 IMPLANT
SHEATH URET ACCESS 12FR/35CM (UROLOGICAL SUPPLIES) IMPLANT
SHEATH URET ACCESS 12FR/55CM (UROLOGICAL SUPPLIES) IMPLANT
STENT POLARIS LOOP 6FR X 24 CM (STENTS) ×6 IMPLANT
SYRINGE 10CC LL (SYRINGE) ×3 IMPLANT
SYRINGE IRR TOOMEY STRL 70CC (SYRINGE) IMPLANT
TUBE FEEDING 8FR 16IN STR KANG (MISCELLANEOUS) ×3 IMPLANT

## 2013-05-07 NOTE — Transfer of Care (Signed)
Immediate Anesthesia Transfer of Care Note  Patient: [name]  Procedure(s) Performed: Procedure(s) (LRB): CYSTOSCOPY WITH RETROGRADE PYELOGRAM, URETEROSCOPY  (Bilateral) CYSTOSCOPY WITH STENT REPLACEMENTS (Bilateral)  Patient Location: PACU  Anesthesia Type: General  Level of Consciousness: awake, alert  and oriented  Airway & Oxygen Therapy: Patient Spontanous Breathing and Patient connected to face mask oxygen  Post-op Assessment: Report given to PACU RN and Post -op Vital signs reviewed and stable  Post vital signs: Reviewed and stable  Complications: No apparent anesthesia complications

## 2013-05-07 NOTE — Anesthesia Preprocedure Evaluation (Addendum)
Anesthesia Evaluation  Patient identified by MRN, date of birth, ID band Patient awake    Reviewed: Allergy & Precautions, H&P , NPO status , Patient's Chart, lab work & pertinent test results  Airway Mallampati: I TM Distance: >3 FB Neck ROM: full    Dental  (+) Teeth Intact and Dental Advidsory Given   Pulmonary neg pulmonary ROS,  breath sounds clear to auscultation  Pulmonary exam normal       Cardiovascular hypertension, On Medications - angina+ Valvular Problems/Murmurs Rhythm:regular Rate:Normal     Neuro/Psych    GI/Hepatic Neg liver ROS, GERD-  Medicated,  Endo/Other  negative endocrine ROS  Renal/GU negative Renal ROS     Musculoskeletal   Abdominal Normal abdominal exam  (+)   Peds  Hematology   Anesthesia Other Findings   Reproductive/Obstetrics                           Anesthesia Physical Anesthesia Plan  ASA: II  Anesthesia Plan: General   Post-op Pain Management:    Induction: Intravenous  Airway Management Planned: LMA  Additional Equipment:   Intra-op Plan:   Post-operative Plan: Extubation in OR  Informed Consent: I have reviewed the patients History and Physical, chart, labs and discussed the procedure including the risks, benefits and alternatives for the proposed anesthesia with the patient or authorized representative who has indicated his/her understanding and acceptance.   Dental advisory given  Plan Discussed with: CRNA  Anesthesia Plan Comments:         Anesthesia Quick Evaluation

## 2013-05-07 NOTE — Anesthesia Procedure Notes (Signed)
Procedure Name: LMA Insertion Date/Time: 05/07/2013 10:14 AM Performed by: Norva Pavlov Pre-anesthesia Checklist: Patient identified, Emergency Drugs available, Suction available and Patient being monitored Patient Re-evaluated:Patient Re-evaluated prior to inductionOxygen Delivery Method: Circle System Utilized Preoxygenation: Pre-oxygenation with 100% oxygen Intubation Type: IV induction Ventilation: Mask ventilation without difficulty LMA: LMA inserted LMA Size: 4.0 Number of attempts: 1 Airway Equipment and Method: bite block Placement Confirmation: positive ETCO2 Tube secured with: Tape Dental Injury: Teeth and Oropharynx as per pre-operative assessment

## 2013-05-07 NOTE — Brief Op Note (Signed)
05/07/2013  11:18 AM  PATIENT:  [name]  79 y.o. female  PRE-OPERATIVE DIAGNOSIS:  BILATERAL URETERAL STONES  POST-OPERATIVE DIAGNOSIS:  BILATERAL URETERAL STONES  PROCEDURE:  Cysto, Bilat retrogrades, Bilat ureteroscopy with basket ureteral stones, bilat ureteral stent exchange  SURGEON:  Surgeon(s) and Role:    * Sebastian Ache, MD - Primary  PHYSICIAN ASSISTANT:   ASSISTANTS: none   ANESTHESIA:   general  EBL:  Total I/O In: 300 [I.V.:300] Out: -   BLOOD ADMINISTERED:none  DRAINS: none   LOCAL MEDICATIONS USED:  NONE  SPECIMEN:  Source of Specimen:  Bilateral Ureteral and Renal Stones  DISPOSITION OF SPECIMEN:  [facility] for compositional analysis  COUNTS:  YES  TOURNIQUET:  * No tourniquets in log *  DICTATION: .Other Dictation: Dictation Number  [id number]  PLAN OF CARE: Discharge to home after PACU  PATIENT DISPOSITION:  PACU - hemodynamically stable.   Delay start of Pharmacological VTE agent (>24hrs) due to surgical blood loss or risk of bleeding: yes

## 2013-05-07 NOTE — Op Note (Signed)
[name], Alison NO.:  [id number]  MEDICAL RECORD NO.:  [id number]  LOCATION:                                 FACILITY:  PHYSICIAN:  Sebastian Ache, MD     DATE OF BIRTH:  16-Sep-1934  DATE OF PROCEDURE:  05/07/2013 DATE OF DISCHARGE:                              OPERATIVE REPORT   DIAGNOSIS:  Bilateral ureteral and renal stones.  PROCEDURE: 1. Cystoscopy with bilateral retrograde pyelograms and interpretation. 2. Exchange of bilateral ureteral stents. 3. Bilateral ureteroscopy with basketing of stones.  ESTIMATED BLOOD LOSS:  Nil.  COMPLICATIONS:  None.  FINDINGS: 1. Right small volume intrarenal stone. 2. Multifocal left stone with large distal ureteral stone, and several     small intrarenal stones.  INDICATION:  [name] is a pleasant 79 year old lady with recent history of first episode of renal colic.  She was found on workup of this to have bilateral obstructing ureteral stones with proximal hydronephrosis.  She had bacteriuria at that time, therefore she underwent temporizing with insertion of bilateral ureteral stents.  She has had interval negative urine culture and now presents for definitive stone management.  Informed consent was obtained and placed in medical record.  DESCRIPTION OF PROCEDURE:  The patient is being, [name], the procedure being bilateral ureteroscopic stone manipulation was confirmed.  Procedure was carried out.  Time-out was performed. Intravenous antibiotics administered.  General LMA anesthesia was introduced.  The patient placed into a low lithotomy position.  Sterile field was created by prepping and draping the patient's vagina, introitus, and proximal thighs using iodine x3.  Next, cystourethroscopy was performed using a 22-rigid cystoscope with 12 degree offset lens and stationary bladder revealed distal end of bilateral stents in adequate position.  The right ureteral stent was grasped and brought  out in its entirety, set aside for discard.  The right ureteral orifice was then cannulated with a 6-French end-hole catheter.  Right retrograde pyelogram was obtained.  Right retrograde pyelogram demonstrated single right ureter, single system right kidney without filling defects or narrowing noted.  A 0.038 Glidewire was advanced at the level of the upper pole and set aside as a safety wire.  Next, semi-rigid ureteroscopy was performed in the entire length of the right ureter alongside a separate Sensor working wire with an 8-French feeding tube in the urinary bladder for pressure release. This revealed no mucosal abnormalities and scant stone fragments only. As such, the semi-rigid ureteroscope was exchanged with a 12/14, 24 cm ureteral access sheath under fluoroscopic vision to the level of mid ureter.  Next, flexible digital ureteroscopy was performed using 8- [place] digital ureteroscope of the proximal ureter and right kidney. Systematic inspection revealed several small stones within the mid pole calyx.  Total stone volume approximately 6 mm.  These likely represented retrograde positioning of prior right ureteral stone.  As such, an escape type basket was used to grasp the stone sequentially moved on their entirety and set aside for compositional analysis.  Repeat and systematic inspection of each calix revealed complete resolution of all stones larger than 1/3 mm in diameter.  The ureteral access sheath was removed under continuous ureteroscopic vision and  no mucosal abnormalities were found.  Finally, a new 6 x 24 Polaris-type stent was carefully navigated up the right ureter using fluoroscopic guidance. Good proximal curl and distal placement were noted.  Attention was directed at the left side.  Once again using a cystoscope, the left distal stent was grasped with cold graspers and the stent was brought out its entirety set aside for discard.  The left ureteral  stent cannulated with 6-French end-hole catheter and left retrograde pyelogram was obtained.  Left retrograde pyelogram demonstrated single left ureter, single system left kidney.  There was a large filling defect in distal ureter consistent with known stone.  A 0.038 glidewire was advanced at the level of the upper pole and set aside as a safety wire.  Next, semi- rigid ureteroscopy was performed of the distal left ureter.  Indeed a large calcification was found in the distal ureter and had a very ovoid and elongated appearance, and this did not appear amenable to basketing as such escape basket was used to grasp the stone on its long axis and was carefully navigated out its entirety and set aside for compositional analysis.  Semi-rigid ureteroscopy was performed of the entire remaining length of the left ureter alongside a separate Sensor working wire and no mucosal abnormalities or calcifications were noted.  As the patient did notably have some small volume intrarenal stone prior CT scan, it was felt that inspection of this area was also warranted as such.  The semi-rigid ureteroscope was exchanged for a 12/14 access sheath, 24 cm in length to the level of the mid ureter using fluoroscopic vision. Next, flexible digital ureteroscopy was performed of left proximal ureter and systematic inspection of each calyx of the left kidney, two papillary tip calcifications were noted.  One in an upper pole, that was just barely submucosal and in the lower pole.  These also appeared amenable to basketing and as such, an escape basket was used to grasp these separately and removed out their entirety and set aside for compositional analysis.  Repeat systematic inspection of each calix on the left side revealed complete resolution of all stone fragments larger than 1/3 mm.  The ureter was carefully inspected along its entire length.  Upon withdrawing of the access sheath, using ureteroscopic vision  and no mucosal abnormalities were found.  Finally, a new 6 x 24 polaris stent was placed on the left side.  Using fluoroscopic guidance, good proximal curl and distal placement were noted.  Bladder was emptied per cystoscope.  Procedure was then terminated.  The patient tolerated the procedure well.  There were no immediate periprocedural complications.  The patient was taken to postanesthesia care unit in stable condition.          ______________________________ Sebastian Ache, MD     TM/MEDQ  D:  05/07/2013  T:  05/07/2013  Job:  [id number]

## 2013-05-07 NOTE — Anesthesia Postprocedure Evaluation (Signed)
Anesthesia Post Note  Patient: [name]  Procedure(s) Performed: Procedure(s) (LRB): CYSTOSCOPY WITH RETROGRADE PYELOGRAM, URETEROSCOPY  (Bilateral) CYSTOSCOPY WITH STENT REPLACEMENTS (Bilateral)  Anesthesia type: General  Patient location: PACU  Post pain: Pain level controlled  Post assessment: Post-op Vital signs reviewed  Last Vitals:  Filed Vitals:   05/07/13 1200  BP:   Pulse: 70  Temp:   Resp:     Post vital signs: Reviewed  Level of consciousness: sedated  Complications: No apparent anesthesia complications

## 2013-05-07 NOTE — H&P (Signed)
[name] is an 79 y.o. female.    Chief Complaint: Pre-op Bilateral Ureteroscopic Stone Manipulation  HPI:   1 - Bilateral Ureteral Stones - pt wtih bilteral uretral stones (Lt 5mm distal, Rt 4mm UVJ) with mild hydro and small volume intrarenal stones on w/u for flank pain by CT. No prior episodes. Underwent urgent bilateral ureteral stenting 04/07/13 wtih plan for definitive treatment with ureteroscopy at later date.  2 - Bacteruria - Pt wtih bacteruria noted at PCP 6/28 with multiple organisms including pan-sensitive e. coli. No fever / chills. Follow-up CX negative, and then low-growth non-specific. She has therefore been on Bactrim pre-op for today.  3 - Benign Bladder Lesion - Pt with erythematous polyploid lesion near Rt UO incidental at cysto 04/07/13, BX-proven benign inflammatory tissue, likely related to stone nearby.  PMH sig for chole, hyst, colon resection, "slow growing" but progressive ovarian cancer folowed by Dr. Loree Powell.  Today [name] is seen to proceed with bilateral ureteroscopic stone manipulation. No interval fevers.   Past Medical History  Diagnosis Date  . Hypertension   . Hyperlipidemia   . GERD (gastroesophageal reflux disease)   . Heart murmur   . Ureteral calculi     bilateral  . History of breast cancer ONCOLOGIST-- DR Truett Perna    DX 2010--  RIGHT BREAST DCIS  HIGH GRADE S/P LUMPECTOMY W/ SLN BX--  NO RECURRENCE  . 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 History of skin cancer     excision basal cell  . Frequency of urination   . Urgency of urination   . Nocturia   . Dysuria     Past Surgical History  Procedure Laterality Date  . Total abdominal hysterectomy w/ bilateral salpingoophorectomy  1976  . Cholecystectomy    . Appendectomy    . Wound debridement  05/28/2012    Procedure:  DEBRIDEMENT ABDOMINAL WOUND;  Surgeon: Currie Paris, MD;  Location: [facility];  Service: General;  Laterality: N/A;  excision chronic wound abdominal wall  . Cystoscopy with stent placement Bilateral 04/07/2013    Procedure: CYSTOSCOPY WITH STENT PLACEMENT;  Surgeon: Sebastian Ache, MD;  Location: [facility];  Service: Urology;  Laterality: Bilateral;  . Cystoscopy with biopsy N/A 04/07/2013    Procedure: CYSTOSCOPY WITH BIOPSY and fulgeration;  Surgeon: Sebastian Ache, MD;  Location: [facility];  Service: Urology;  Laterality: N/A;  . Excision right breast mass  02-10-2002  . Dx laparoscopy w/ peritoneal and omental bx's and washings  02-16-2008  . Exp. lap. extensive adhesiolysis/ resection terminal ileum , ascending and descending colon with creation ileostomy and mucous fistula  02-19-2008    PERFERATION AND ABD. CANCER--  TAKEDOWN ILEOSTOMY 08-23-2008  . Cardiac catheterization  11-11-2005 DR Verdis Prime    MODERATE LAD DISEASE/ NORMAL LVF/ EF 65-75%  . Breast lumpectomy Right 04-13-2009    W/ SLN BX  . Cataract extraction w/ intraocular lens implant Left 04-26-2013    Family History  Problem Relation Age of Onset  . Breast cancer Sister   . Cancer Sister     breast  . Heart disease Mother    Social History:  reports that she has never smoked. She has never used smokeless tobacco. She reports that she does not drink alcohol or use illicit drugs.  Allergies: No Known Allergies  No prescriptions prior to  admission    No results found for this or any previous visit (from the past 48 hour(s)). No results found.  Review of Systems  Constitutional: Negative.  Negative for fever and chills.  HENT: Negative.   Eyes: Negative.   Respiratory: Negative.   Cardiovascular: Negative.   Gastrointestinal: Negative.   Genitourinary: Positive for flank pain.  Musculoskeletal: Negative.   Skin: Negative.   Neurological: Negative.   Endo/Heme/Allergies: Negative.    Psychiatric/Behavioral: Negative.     Height 5\' 7"  (1.702 m), weight 65.772 kg (145 lb). Physical Exam  Constitutional: She is oriented to person, place, and time. She appears well-developed and well-nourished.  HENT:  Head: Normocephalic and atraumatic.  Eyes: EOM are normal. Pupils are equal, round, and reactive to light.  Neck: Normal range of motion. Neck supple.  Cardiovascular: Normal rate and regular rhythm.   Respiratory: Effort normal and breath sounds normal.  GI: Soft. Bowel sounds are normal.  Genitourinary:  Minimal CVAT  Musculoskeletal: Normal range of motion.  Neurological: She is alert and oriented to person, place, and time.  Skin: Skin is warm and dry.  Psychiatric: She has a normal mood and affect. Her behavior is normal. Judgment and thought content normal.     Assessment/Plan  1 - Bilateral Ureteral Stones - now stented, will proceed wtih bilateral URS today as planned.  We rediscussed ureteroscopic stone manipulation with basketing and laser-lithotripsy in detail.  We rediscussed risks including bleeding, infection, damage to kidney / ureter  bladder, rarely loss of kidney. We rediscussed anesthetic risks and rare but serious surgical complications including DVT, PE, MI, and mortality. We specifically addressed that in 5-10% of cases a staged approach is required with stenting followed by re-attempt ureteroscopy if anatomy unfavorable. The patient voiced understanding and wises to proceed. Will plan on post-op stenting as bilateral case.  2 - Bacteruria - Has been on CX-specific ABX peri-op.  3 - Benign Bladder Lesion - benign histology, incidental, likley reation to nearby stone, observe.  [name] 05/07/2013, 6:17 AM

## 2013-05-10 ENCOUNTER — Encounter (HOSPITAL_BASED_OUTPATIENT_CLINIC_OR_DEPARTMENT_OTHER): Payer: Self-pay | Admitting: Urology

## 2013-05-10 DIAGNOSIS — N200 Calculus of kidney: Secondary | ICD-10-CM | POA: Diagnosis not present

## 2013-05-24 DIAGNOSIS — N200 Calculus of kidney: Secondary | ICD-10-CM | POA: Diagnosis not present

## 2013-05-24 DIAGNOSIS — N390 Urinary tract infection, site not specified: Secondary | ICD-10-CM | POA: Diagnosis not present

## 2013-05-24 NOTE — Telephone Encounter (Signed)
, °

## 2013-06-01 DIAGNOSIS — E538 Deficiency of other specified B group vitamins: Secondary | ICD-10-CM | POA: Diagnosis not present

## 2013-06-10 DIAGNOSIS — H2510 Age-related nuclear cataract, unspecified eye: Secondary | ICD-10-CM | POA: Diagnosis not present

## 2013-06-14 DIAGNOSIS — I2510 Atherosclerotic heart disease of native coronary artery without angina pectoris: Secondary | ICD-10-CM | POA: Diagnosis not present

## 2013-06-14 DIAGNOSIS — E785 Hyperlipidemia, unspecified: Secondary | ICD-10-CM | POA: Diagnosis not present

## 2013-06-14 DIAGNOSIS — R252 Cramp and spasm: Secondary | ICD-10-CM | POA: Diagnosis not present

## 2013-06-14 DIAGNOSIS — I10 Essential (primary) hypertension: Secondary | ICD-10-CM | POA: Diagnosis not present

## 2013-06-23 DIAGNOSIS — H269 Unspecified cataract: Secondary | ICD-10-CM | POA: Diagnosis not present

## 2013-06-23 DIAGNOSIS — H2589 Other age-related cataract: Secondary | ICD-10-CM | POA: Diagnosis not present

## 2013-06-23 DIAGNOSIS — H2510 Age-related nuclear cataract, unspecified eye: Secondary | ICD-10-CM | POA: Diagnosis not present

## 2013-07-06 DIAGNOSIS — E538 Deficiency of other specified B group vitamins: Secondary | ICD-10-CM | POA: Diagnosis not present

## 2013-07-16 ENCOUNTER — Other Ambulatory Visit: Payer: Self-pay | Admitting: *Deleted

## 2013-07-16 DIAGNOSIS — C561 Malignant neoplasm of right ovary: Secondary | ICD-10-CM

## 2013-07-16 MED ORDER — ANASTROZOLE 1 MG PO TABS: ORAL_TABLET | ORAL | Status: DC

## 2013-07-21 DIAGNOSIS — M2010 Hallux valgus (acquired), unspecified foot: Secondary | ICD-10-CM | POA: Diagnosis not present

## 2013-07-21 DIAGNOSIS — M19079 Primary osteoarthritis, unspecified ankle and foot: Secondary | ICD-10-CM | POA: Diagnosis not present

## 2013-07-21 DIAGNOSIS — M2020 Hallux rigidus, unspecified foot: Secondary | ICD-10-CM | POA: Diagnosis not present

## 2013-07-28 ENCOUNTER — Other Ambulatory Visit: Payer: Self-pay | Admitting: Interventional Cardiology

## 2013-07-30 ENCOUNTER — Ambulatory Visit: Payer: Self-pay | Admitting: Gynecology

## 2013-07-30 ENCOUNTER — Ambulatory Visit: Payer: Self-pay | Admitting: Obstetrics and Gynecology

## 2013-08-02 ENCOUNTER — Other Ambulatory Visit: Payer: Self-pay | Admitting: Oncology

## 2013-08-04 ENCOUNTER — Ambulatory Visit: Payer: Self-pay | Admitting: Gynecology

## 2013-08-06 DIAGNOSIS — E538 Deficiency of other specified B group vitamins: Secondary | ICD-10-CM | POA: Diagnosis not present

## 2013-08-06 DIAGNOSIS — Z23 Encounter for immunization: Secondary | ICD-10-CM | POA: Diagnosis not present

## 2013-08-10 ENCOUNTER — Telehealth: Payer: Self-pay | Admitting: Interventional Cardiology

## 2013-08-10 ENCOUNTER — Telehealth: Payer: Self-pay | Admitting: Oncology

## 2013-08-10 MED ORDER — CARVEDILOL 25 MG PO TABS: 12.5000 mg | ORAL_TABLET | Freq: Two times a day (BID) | ORAL | Status: DC

## 2013-08-10 NOTE — Telephone Encounter (Signed)
Pt called r/s MD vsiit from November to MAy 1st opening for MD, nurse notifed

## 2013-08-10 NOTE — Telephone Encounter (Signed)
New Problem:  Pt states she would like to speak to Murray. Pt states she will give more details when the nurse calls. Please advise

## 2013-08-10 NOTE — Telephone Encounter (Signed)
returned pt call.pt sts that [facility] has carvedilol on back order and rqst that we send rx to [facility] for a 90day supply

## 2013-08-11 ENCOUNTER — Encounter: Payer: Self-pay | Admitting: Gynecology

## 2013-08-11 ENCOUNTER — Ambulatory Visit (INDEPENDENT_AMBULATORY_CARE_PROVIDER_SITE_OTHER): Payer: Medicare Other | Admitting: Gynecology

## 2013-08-11 VITALS — BP 132/62 | HR 78 | Resp 12 | Ht 66.0 in | Wt 149.0 lb

## 2013-08-11 DIAGNOSIS — K649 Unspecified hemorrhoids: Secondary | ICD-10-CM | POA: Diagnosis not present

## 2013-08-11 DIAGNOSIS — Z01419 Encounter for gynecological examination (general) (routine) without abnormal findings: Secondary | ICD-10-CM | POA: Diagnosis not present

## 2013-08-11 DIAGNOSIS — Z124 Encounter for screening for malignant neoplasm of cervix: Secondary | ICD-10-CM | POA: Diagnosis not present

## 2013-08-11 DIAGNOSIS — C801 Malignant (primary) neoplasm, unspecified: Secondary | ICD-10-CM | POA: Diagnosis not present

## 2013-08-11 DIAGNOSIS — R197 Diarrhea, unspecified: Secondary | ICD-10-CM

## 2013-08-11 MED ORDER — LIDOCAINE HCL 2 % EX GEL: 1.0000 "application " | CUTANEOUS | Status: DC | PRN

## 2013-08-11 NOTE — Progress Notes (Signed)
79 y.o. Married Caucasian female  G0 here for annual exam. Pt reports menses are absent.  She does not report hot flashes, does not have night sweats, does not have vaginal dryness.  She is not using lubricants.  She does not report post-menopasual bleeding.  Pt recently stopped magnesium due to diarrhea, she can have 15 loose stools/d now down to 5-6x/d.  Pt reports hemorrhoids now with bleeding about 90% bowel movements.  Sees Dr Annia Friendly- for GI   No LMP recorded. Patient has had a hysterectomy.          Sexually active: no  The current method of family planning is status post hysterectomy.    Exercising: yes  Silver sneakers 2-3x/wk Last pap: 06/05/11 Abnormal PAP: no Mammogram: 03/26/13 BSE: sometimes  Colonoscopy: 10/2007 DEXA:  2013 Alcohol: no Tobacco: no  Hgb: PCP ; Urine: PCP  Health Maintenance  Topic Date Due  . Tetanus/tdap  10/04/1955  . Colonoscopy  10/03/1986  . Zostavax  10/03/1996  . Pneumococcal Polysaccharide Vaccine Age 47 And Over  10/03/2001  . Influenza Vaccine  05/07/2013    Family History  Problem Relation Age of Onset  . Breast cancer Sister   . Cancer Sister     breast  . Heart disease Mother     Patient Active Problem List   Diagnosis Date Noted  . Serous tumor, 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    Past Medical History  Diagnosis Date  . Hypertension   . Hyperlipidemia   . GERD (gastroesophageal reflux disease)   . Heart murmur   . Ureteral calculi     bilateral  . History of breast cancer ONCOLOGIST-- DR Truett Perna    DX 2010--  RIGHT BREAST DCIS  HIGH GRADE S/P LUMPECTOMY W/ SLN BX--  NO RECURRENCE  . 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 History of skin cancer     excision basal cell  . Frequency of urination   . Urgency of urination   . Nocturia   . Dysuria     Past  Surgical History  Procedure Laterality Date  . Total abdominal hysterectomy w/ bilateral salpingoophorectomy  1976  . Cholecystectomy    . Appendectomy    . Wound debridement  05/28/2012    Procedure: DEBRIDEMENT ABDOMINAL WOUND;  Surgeon: Currie Paris, MD;  Location: [facility];  Service: General;  Laterality: N/A;  excision chronic wound abdominal wall  . Cystoscopy with stent placement Bilateral 04/07/2013    Procedure: CYSTOSCOPY WITH STENT PLACEMENT;  Surgeon: Sebastian Ache, MD;  Location: [facility];  Service: Urology;  Laterality: Bilateral;  . Cystoscopy with biopsy N/A 04/07/2013    Procedure: CYSTOSCOPY WITH BIOPSY and fulgeration;  Surgeon: Sebastian Ache, MD;  Location: [facility];  Service: Urology;  Laterality: N/A;  . Excision right breast mass  02-10-2002  . Dx laparoscopy w/ peritoneal and omental bx's and washings  02-16-2008  . Exp. lap. extensive adhesiolysis/ resection terminal ileum , ascending and descending colon with creation ileostomy and mucous fistula  02-19-2008    PERFERATION AND ABD. CANCER--  TAKEDOWN ILEOSTOMY 08-23-2008  . Cardiac catheterization  11-11-2005 DR Verdis Prime    MODERATE LAD DISEASE/ NORMAL LVF/ EF 65-75%  . Breast lumpectomy Right 04-13-2009    W/ SLN BX  . Cataract extraction w/ intraocular lens implant Left 04-26-2013  .  Cystoscopy with retrograde pyelogram, ureteroscopy and stent placement Bilateral 05/07/2013    Procedure: CYSTOSCOPY WITH RETROGRADE PYELOGRAM, URETEROSCOPY ;  Surgeon: Sebastian Ache, MD;  Location: [facility];  Service: Urology;  Laterality: Bilateral;  . Cystoscopy w/ ureteral stent placement Bilateral 05/07/2013    Procedure: CYSTOSCOPY WITH STENT REPLACEMENTS;  Surgeon: Sebastian Ache, MD;  Location: [facility];  Service: Urology;  Laterality: Bilateral;    Allergies: Review of patient's allergies indicates no known allergies.  Current Outpatient Prescriptions  Medication Sig  Dispense Refill  . amLODipine (NORVASC) 5 MG tablet TAKE 1 TABLET DAILY  90 tablet  1  . anastrozole (ARIMIDEX) 1 MG tablet TAKE 1 TABLET BY MOUTH DAILY.  90 tablet  0  . aspirin 81 MG tablet Take 81 mg by mouth daily.      . Calcium Carbonate-Vitamin D (CALTRATE 600+D PO) Take 1 tablet by mouth 2 (two) times daily.      . carvedilol (COREG) 25 MG tablet Take 0.5 tablets (12.5 mg total) by mouth 2 (two) times daily with a meal.  90 tablet  1  . cholecalciferol (VITAMIN D) 1000 UNITS tablet Take 2,000 Units by mouth daily.       Marland Kitchen esomeprazole (NEXIUM) 40 MG capsule Take 40 mg by mouth daily before breakfast.      . hydrochlorothiazide (HYDRODIURIL) 25 MG tablet Take 25 mg by mouth daily.      . magnesium oxide (MAG-OX) 400 MG tablet Take 400 mg by mouth every evening.       . Multiple Vitamins-Minerals (MULTIVITAMIN PO) Take 1 tablet by mouth daily.      Marland Kitchen oxybutynin (DITROPAN) 5 MG tablet Take 1 tablet (5 mg total) by mouth every 8 (eight) hours as needed. For bladder spasms / stent discomfort.  30 tablet  1  . senna-docusate (SENOKOT-S) 8.6-50 MG per tablet Take 1 tablet by mouth 2 (two) times daily. While taking pain meds to prevent constipation  30 tablet  1  . sulfamethoxazole-trimethoprim (BACTRIM DS) 800-160 MG per tablet Take 1 tablet by mouth daily. X 3 days. Begin day prior to next Urology appointment.  3 tablet  0  . traMADol (ULTRAM) 50 MG tablet Take 1 tablet (50 mg total) by mouth every 6 (six) hours as needed for pain. Postoperatively  30 tablet  1  . valsartan (DIOVAN) 320 MG tablet Take 320 mg by mouth every morning.       No current facility-administered medications for this visit.    ROS: Pertinent items are noted in HPI.  Exam:    BP 132/62  Pulse 78  Resp 12  Ht 5\' 6"  (1.676 m)  Wt 149 lb (67.586 kg)  BMI 24.06 kg/m2 Weight change: @WEIGHTCHANGE @ Last 3 height recordings:  Ht Readings from Last 3 Encounters:  08/11/13 5\' 6"  (1.676 m)  05/03/13 5\' 7"  (1.702 m)   05/03/13 5\' 7"  (1.702 m)   General appearance: alert, cooperative and appears stated age Head: Normocephalic, without obvious abnormality, atraumatic Neck: no adenopathy, no carotid bruit, no JVD, supple, symmetrical, trachea midline and thyroid not enlarged, symmetric, no tenderness/mass/nodules Lungs: clear to auscultation bilaterally Breasts: normal appearance, no masses or tenderness, right breast with scars and retractions, no mass Heart: regular rate and rhythm, S1, S2 normal, no murmur, click, rub or gallop Abdomen: soft, non-tender; bowel sounds normal; no masses,  no organomegaly Extremities: extremities normal, atraumatic, no cyanosis or edema Skin: Skin color, texture, turgor normal. No rashes or lesions Lymph  nodes: Cervical, supraclavicular, and axillary nodes normal. no inguinal nodes palpated Neurologic: Grossly normal   Pelvic: External genitalia:  no lesions              Urethra: normal appearing urethra with no masses, tenderness or lesions              Bartholins and Skenes: normal                 Vagina: atrophic              Cervix: absent              Pap taken: no        Bimanual Exam:  Uterus:  absent                                      Adnexa:    no masses                                      Rectovaginal: internal hemorrhoid with tenderness no blood noted                                      Anus:  normal sphincter tone, no lesions, peri-rectal skin with erythema, intact  A: history of breast cancer followed by Dr Jarrett Ables History of bowel resection and subsequent diarrhea History of papillary serous carcinoma-low grade with recurrence followed by gyn-onc annually  P: suggest pt contact GI regarding rectal bleeding, can use  balmex to protect skin and lidocaine jelly sparingly-cardiac effects reviewed until seen F/u with GI regarding stopping magnesium F/u with other porviders as scheduled Discussed PAP guideline changes, importance of weight bearing  exercises, calcium, vit D and balanced diet.  An After Visit Summary was printed and given to the patient.

## 2013-08-11 NOTE — Patient Instructions (Addendum)

## 2013-08-27 ENCOUNTER — Ambulatory Visit: Payer: Medicare Other | Admitting: Oncology

## 2013-09-08 ENCOUNTER — Other Ambulatory Visit: Payer: Self-pay | Admitting: Gynecology

## 2013-09-09 NOTE — Telephone Encounter (Signed)
Patient said she was returning [facility] call but this is the only note i see.

## 2013-09-09 NOTE — Telephone Encounter (Signed)
Please advise if ok to refill. Per last office note is only to use this medication sparingly until seen by GI doctor. Spoke with patient- is using this regularly with relief, has not made appointment with GI doctor, but will be going in on Monday for B12 injection and will speak with them then.

## 2013-09-13 DIAGNOSIS — E538 Deficiency of other specified B group vitamins: Secondary | ICD-10-CM | POA: Diagnosis not present

## 2013-10-08 ENCOUNTER — Other Ambulatory Visit: Payer: Self-pay

## 2013-10-08 MED ORDER — HYDROCHLOROTHIAZIDE 25 MG PO TABS: 25.0000 mg | ORAL_TABLET | Freq: Every day | ORAL | Status: DC

## 2013-10-11 ENCOUNTER — Telehealth: Payer: Self-pay | Admitting: *Deleted

## 2013-10-11 MED ORDER — HYDROCHLOROTHIAZIDE 12.5 MG PO TABS: 12.5000 mg | ORAL_TABLET | Freq: Every day | ORAL | Status: DC

## 2013-10-11 NOTE — Telephone Encounter (Signed)
Done

## 2013-10-11 NOTE — Telephone Encounter (Signed)
Patient requests refill on hctz to be sent to [facility]. She stated that Dr Tamala Julian has her on a 12.5mg  tablet, and she requests a 90 day supply. Thanks, MI

## 2013-10-14 DIAGNOSIS — E538 Deficiency of other specified B group vitamins: Secondary | ICD-10-CM | POA: Diagnosis not present

## 2013-10-26 ENCOUNTER — Telehealth: Payer: Self-pay | Admitting: Interventional Cardiology

## 2013-10-26 ENCOUNTER — Other Ambulatory Visit: Payer: Self-pay

## 2013-10-26 MED ORDER — CARVEDILOL 12.5 MG PO TABS: 12.5000 mg | ORAL_TABLET | Freq: Two times a day (BID) | ORAL | Status: DC

## 2013-10-26 MED ORDER — HYDROCHLOROTHIAZIDE 12.5 MG PO TABS: 12.5000 mg | ORAL_TABLET | Freq: Every day | ORAL | Status: DC

## 2013-10-26 NOTE — Telephone Encounter (Signed)
returned pt call.pt mediaction list corrected and pt refills sent to [facility] at pt rqst.

## 2013-10-26 NOTE — Telephone Encounter (Signed)
New message     Talk to a nurse about medication being refilled under the wrong dosage and she has been taking the wrong dosage.  Pt has not seen dr Tamala Julian in the new office.

## 2013-11-15 DIAGNOSIS — E538 Deficiency of other specified B group vitamins: Secondary | ICD-10-CM | POA: Diagnosis not present

## 2013-11-22 IMAGING — RF DG RETROGRADE PYELOGRAM
1 series · 10 of 10 positions shown · non-contrast
Comparison: CT 04/05/2013

CLINICAL DATA: Right renal stone.  Stent placement.  Left renal
stone removal and stent placement.

RETROGRADE PYELOGRAM

[Series 1: run · 10 of 10 slices shown]
[im 1/10]
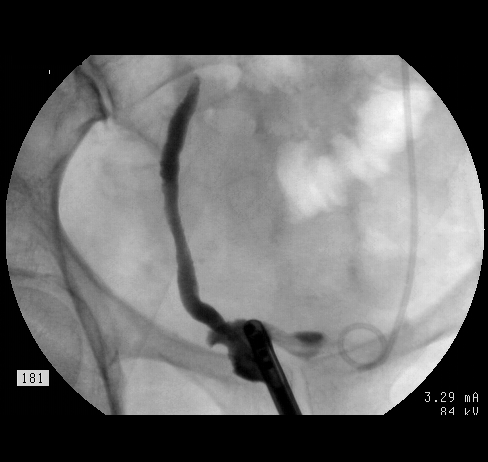
[im 2/10]
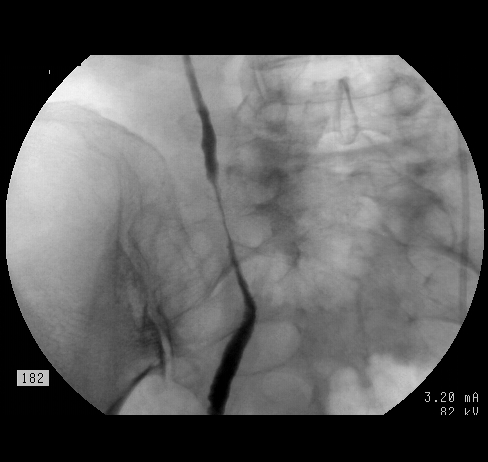
[im 3/10]
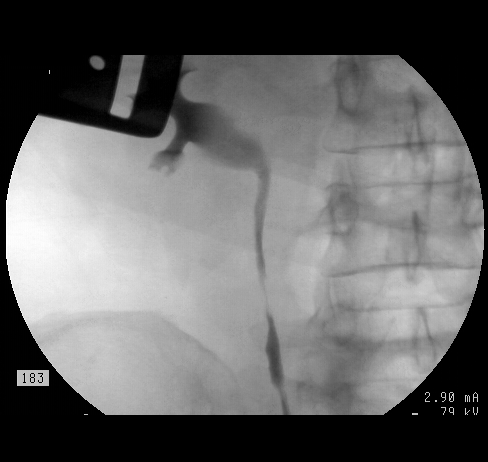
[im 4/10]
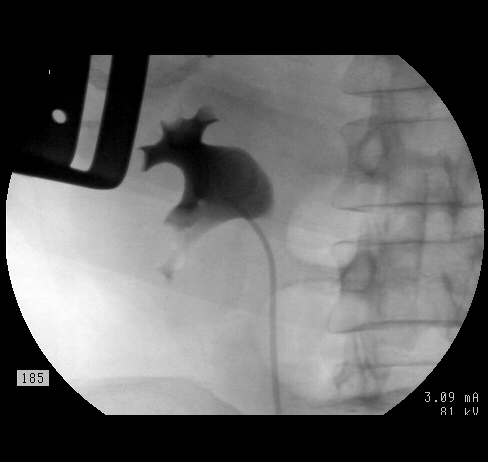
[im 5/10]
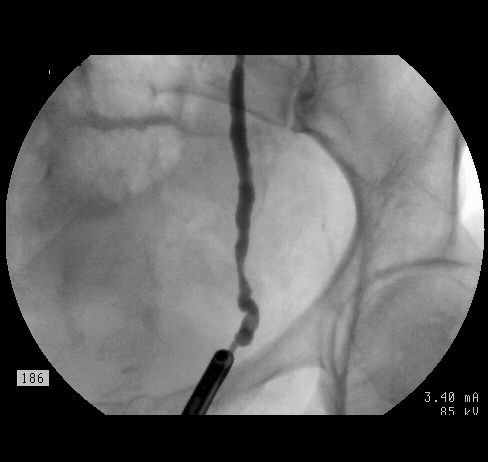
[im 6/10]
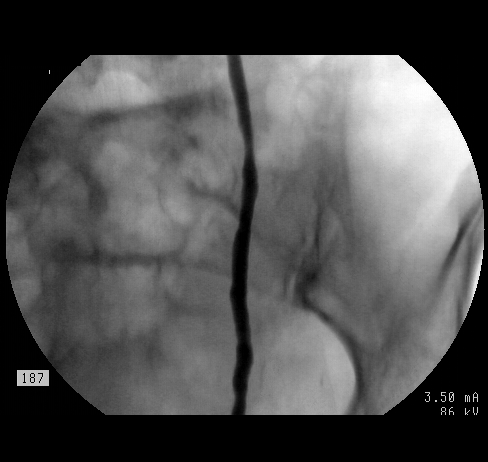
[im 7/10]
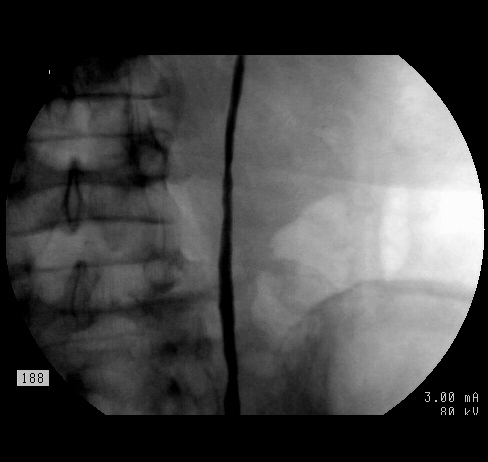
[im 8/10]
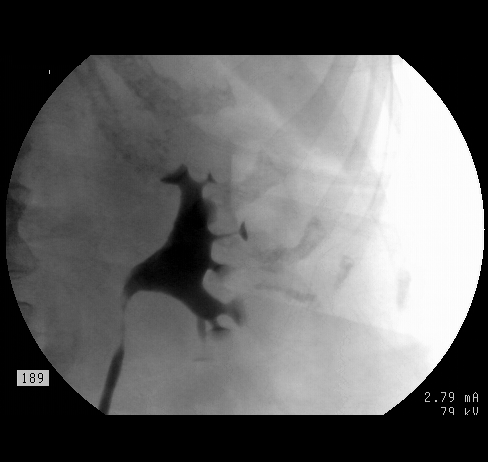
[im 9/10]
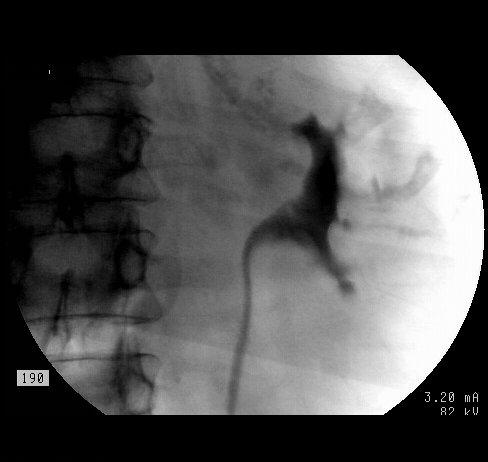
[im 10/10]
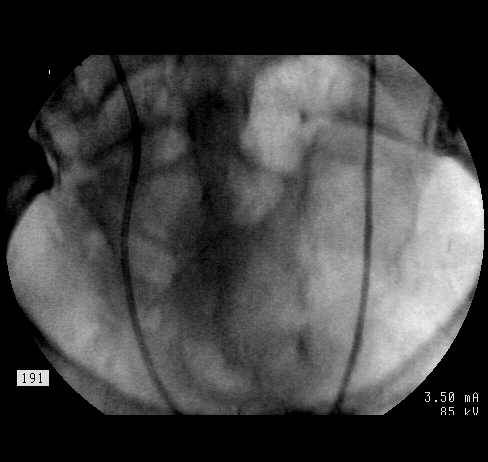

[10 of 10 positions shown; findings below may reference images not displayed]

FINDINGS: Bilateral retrograde pyelograms are performed.  These
demonstrate no definite filling defects.  Normal caliber system
bilaterally.  Final image demonstrates placement of bilateral
ureteral stents.
IMPRESSION: As above.

## 2013-12-06 ENCOUNTER — Other Ambulatory Visit: Payer: Self-pay | Admitting: Oncology

## 2013-12-06 ENCOUNTER — Other Ambulatory Visit: Payer: Self-pay | Admitting: Interventional Cardiology

## 2013-12-15 DIAGNOSIS — M899 Disorder of bone, unspecified: Secondary | ICD-10-CM | POA: Diagnosis not present

## 2013-12-15 DIAGNOSIS — Z853 Personal history of malignant neoplasm of breast: Secondary | ICD-10-CM | POA: Diagnosis not present

## 2013-12-15 DIAGNOSIS — M949 Disorder of cartilage, unspecified: Secondary | ICD-10-CM | POA: Diagnosis not present

## 2013-12-17 ENCOUNTER — Ambulatory Visit (HOSPITAL_BASED_OUTPATIENT_CLINIC_OR_DEPARTMENT_OTHER): Payer: Medicare Other

## 2013-12-17 ENCOUNTER — Ambulatory Visit: Payer: Medicare Other | Attending: Gynecologic Oncology | Admitting: Gynecologic Oncology

## 2013-12-17 ENCOUNTER — Encounter: Payer: Self-pay | Admitting: Gynecologic Oncology

## 2013-12-17 VITALS — BP 131/52 | HR 82 | Temp 97.9°F | Resp 20 | Wt 151.4 lb

## 2013-12-17 DIAGNOSIS — C569 Malignant neoplasm of unspecified ovary: Secondary | ICD-10-CM

## 2013-12-17 DIAGNOSIS — K649 Unspecified hemorrhoids: Secondary | ICD-10-CM

## 2013-12-17 DIAGNOSIS — D509 Iron deficiency anemia, unspecified: Secondary | ICD-10-CM | POA: Diagnosis not present

## 2013-12-17 MED ORDER — HYDROCORTISONE 2.5 % RE CREA: 1.0000 "application " | TOPICAL_CREAM | Freq: Two times a day (BID) | RECTAL | Status: DC | PRN

## 2013-12-17 NOTE — Patient Instructions (Signed)
We will contact you with the results of your CA 125 from today.  Plan to see a provider at Dr. Julieta Bellini office in six months and GYN Oncology in one year or sooner if problems arise.  Please let us know if any new or persistent symptoms develop.
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
## 2013-12-18 LAB — CA 125: CA 125: 8 U/mL (ref 0.0–30.2)

## 2013-12-20 ENCOUNTER — Telehealth: Payer: Self-pay | Admitting: *Deleted

## 2013-12-20 NOTE — Telephone Encounter (Signed)
Message copied by Wilmoth Rasnic, Aletha Halim on Mon Dec 20, 2013  1:33 PM ------      Message from: CROSS, MELISSA D      Created: Mon Dec 20, 2013  9:17 AM       Please let her know that her CA 125 is normal.  Thank you!             ----- Message -----         From: [facility]         Sent: 12/18/2013   7:50 AM           To: Dorothyann Gibbs, NP                   ------

## 2013-12-20 NOTE — Telephone Encounter (Signed)
Called pt to give results. Husband advised pt is visiting grandaughter and will nto be in until end of week. Requested pt to give our office a call upon return. Message for pt: CA125 normal.

## 2013-12-21 ENCOUNTER — Other Ambulatory Visit: Payer: Self-pay | Admitting: Interventional Cardiology

## 2013-12-22 ENCOUNTER — Telehealth: Payer: Self-pay | Admitting: Gynecologic Oncology

## 2013-12-22 NOTE — Telephone Encounter (Signed)
Patient informed of CA 125 result.  No concerns voiced.  Advised to call for any questions or concerns.

## 2014-01-21 DIAGNOSIS — E538 Deficiency of other specified B group vitamins: Secondary | ICD-10-CM | POA: Diagnosis not present

## 2014-02-01 ENCOUNTER — Telehealth: Payer: Self-pay

## 2014-02-01 NOTE — Telephone Encounter (Signed)
Returning a call to Kaitlyn. °

## 2014-02-01 NOTE — Telephone Encounter (Signed)
Left message to call [place] at [phone].  Results from DEXA are stable. Continue with Vitamin D and calcium. Follow up DEXA in 2 years.

## 2014-02-01 NOTE — Telephone Encounter (Signed)
Spoke with patient. Message given from [place]. DXA results are stable. Continue with Vitamin D and Calcium. Follow up DXA in 2 years. Patient agreeable and verbalizes understanding.  Routing to provider for final review. Patient agreeable to disposition. Will close encounter

## 2014-02-11 ENCOUNTER — Ambulatory Visit (HOSPITAL_BASED_OUTPATIENT_CLINIC_OR_DEPARTMENT_OTHER): Payer: Medicare Other | Admitting: Oncology

## 2014-02-11 VITALS — BP 185/71 | HR 58 | Temp 97.7°F | Resp 18 | Ht 66.0 in | Wt 151.4 lb

## 2014-02-11 DIAGNOSIS — C569 Malignant neoplasm of unspecified ovary: Secondary | ICD-10-CM

## 2014-02-11 DIAGNOSIS — Z170 Estrogen receptor positive status [ER+]: Secondary | ICD-10-CM | POA: Diagnosis not present

## 2014-02-11 DIAGNOSIS — C801 Malignant (primary) neoplasm, unspecified: Secondary | ICD-10-CM

## 2014-02-11 DIAGNOSIS — I10 Essential (primary) hypertension: Secondary | ICD-10-CM

## 2014-02-11 DIAGNOSIS — Z803 Family history of malignant neoplasm of breast: Secondary | ICD-10-CM | POA: Diagnosis not present

## 2014-02-11 DIAGNOSIS — D0590 Unspecified type of carcinoma in situ of unspecified breast: Secondary | ICD-10-CM

## 2014-02-11 DIAGNOSIS — C762 Malignant neoplasm of abdomen: Secondary | ICD-10-CM | POA: Diagnosis not present

## 2014-02-11 NOTE — Progress Notes (Signed)
  [facility] OFFICE PROGRESS NOTE   Diagnosis: Breast cancer, ovarian cancer  INTERVAL HISTORY:   She returns as scheduled. She feels well. No hot flashes or arthralgias. She continues Arimidex. A bilateral mammogram on 03/26/2013 was negative. She continues to have frequent bowel movements following the bowel resection.  Objective:  Vital signs in last 24 hours:  Blood pressure 185/71, pulse 58, temperature 97.7 F (36.5 C), temperature source Oral, resp. rate 18, height $RemoveBe'5\' 6"'nKOqPblUp$  (1.676 m), weight 151 lb 6.4 oz (68.675 kg), SpO2 100.00%. repeat blood pressure 158/66    HEENT: Neck without mass Lymphatics: No cervical, supra-clavicular, axillary, or inguinal nodes (cyst in the medial left inguinal region-she reports this has been present chronically)  Resp: Lungs clear bilaterally Cardio: Regular rate and rhythm GI: No hepatomegaly, no apparent ascites, nontender, no mass Vascular: No leg edema Breasts: Firm tissue surrounding the right lumpectomy scar and a right areola scar. No mass in either breast.    Medications: I have reviewed the patient's current medications.  Labs: CA 125 on 12/17/2013-8.0  Assessment/Plan: 1. 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 2. Remote hysterectomy and bilateral salpingo-oophorectomy with the cytology from [facility] confirming numerous "psammoma bodies." 3. 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  a. Status post a needle localized lumpectomy and sentinel lymph node biopsy 04/13/2009 with the pathology confirming high-grade ductal carcinoma in situ with an associated 0.26 mm invasive carcinoma, ER positive, PR positive, and HER2  positive. b. Status post adjuvant right breast radiation completed 06/19/2009. c. Initiation of Arimidex 05/01/2009. 4. Family history of breast cancer. 5. History of Clostridium difficile colitis. 6. Frequent bowel movements following the bowel resection in 2009. 7. Hypertension   Disposition:  Ms. Fidalgo remains in clinical remission from breast cancer and ovarian cancer. She will complete 5 years of Arimidex at the end of July 2015. She understands there may be a benefit of continuing Arimidex longer, but we do not have data to support this at present. She is comfortable discontinuing Arimidex at the end of July 2015.  She plans to continue clinical followup with Dr. Wynetta Emery, her gynecologist, and Dr. Fermin Schwab. She is not scheduled for a followup appointment at the [facility]. We will see her in the future as needed.  Ladell Pier, MD  02/11/2014  4:16 PM

## 2014-02-14 DIAGNOSIS — H0019 Chalazion unspecified eye, unspecified eyelid: Secondary | ICD-10-CM | POA: Diagnosis not present

## 2014-02-18 DIAGNOSIS — H00039 Abscess of eyelid unspecified eye, unspecified eyelid: Secondary | ICD-10-CM | POA: Diagnosis not present

## 2014-02-21 DIAGNOSIS — E538 Deficiency of other specified B group vitamins: Secondary | ICD-10-CM | POA: Diagnosis not present

## 2014-02-25 DIAGNOSIS — H0019 Chalazion unspecified eye, unspecified eyelid: Secondary | ICD-10-CM | POA: Diagnosis not present

## 2014-03-03 DIAGNOSIS — B029 Zoster without complications: Secondary | ICD-10-CM | POA: Diagnosis not present

## 2014-03-25 DIAGNOSIS — E538 Deficiency of other specified B group vitamins: Secondary | ICD-10-CM | POA: Diagnosis not present

## 2014-03-28 DIAGNOSIS — Z853 Personal history of malignant neoplasm of breast: Secondary | ICD-10-CM | POA: Diagnosis not present

## 2014-03-28 DIAGNOSIS — R928 Other abnormal and inconclusive findings on diagnostic imaging of breast: Secondary | ICD-10-CM | POA: Diagnosis not present

## 2014-04-27 DIAGNOSIS — H26499 Other secondary cataract, unspecified eye: Secondary | ICD-10-CM | POA: Diagnosis not present

## 2014-04-28 DIAGNOSIS — E538 Deficiency of other specified B group vitamins: Secondary | ICD-10-CM | POA: Diagnosis not present

## 2014-04-29 ENCOUNTER — Encounter: Payer: Self-pay | Admitting: Interventional Cardiology

## 2014-04-29 DIAGNOSIS — R350 Frequency of micturition: Secondary | ICD-10-CM | POA: Insufficient documentation

## 2014-04-29 DIAGNOSIS — Z853 Personal history of malignant neoplasm of breast: Secondary | ICD-10-CM | POA: Insufficient documentation

## 2014-04-29 DIAGNOSIS — Z85828 Personal history of other malignant neoplasm of skin: Secondary | ICD-10-CM | POA: Insufficient documentation

## 2014-04-29 DIAGNOSIS — I2510 Atherosclerotic heart disease of native coronary artery without angina pectoris: Secondary | ICD-10-CM | POA: Insufficient documentation

## 2014-04-29 DIAGNOSIS — N200 Calculus of kidney: Secondary | ICD-10-CM | POA: Insufficient documentation

## 2014-04-29 DIAGNOSIS — R300 Dysuria: Secondary | ICD-10-CM | POA: Insufficient documentation

## 2014-04-29 DIAGNOSIS — K219 Gastro-esophageal reflux disease without esophagitis: Secondary | ICD-10-CM | POA: Insufficient documentation

## 2014-04-29 DIAGNOSIS — E785 Hyperlipidemia, unspecified: Secondary | ICD-10-CM | POA: Insufficient documentation

## 2014-04-29 DIAGNOSIS — Z8543 Personal history of malignant neoplasm of ovary: Secondary | ICD-10-CM | POA: Insufficient documentation

## 2014-04-29 DIAGNOSIS — N201 Calculus of ureter: Secondary | ICD-10-CM | POA: Insufficient documentation

## 2014-04-29 DIAGNOSIS — I10 Essential (primary) hypertension: Secondary | ICD-10-CM | POA: Insufficient documentation

## 2014-05-14 ENCOUNTER — Other Ambulatory Visit: Payer: Self-pay | Admitting: Interventional Cardiology

## 2014-05-18 DIAGNOSIS — Z23 Encounter for immunization: Secondary | ICD-10-CM | POA: Diagnosis not present

## 2014-05-18 DIAGNOSIS — E538 Deficiency of other specified B group vitamins: Secondary | ICD-10-CM | POA: Diagnosis not present

## 2014-05-18 DIAGNOSIS — I2510 Atherosclerotic heart disease of native coronary artery without angina pectoris: Secondary | ICD-10-CM | POA: Diagnosis not present

## 2014-05-18 DIAGNOSIS — I10 Essential (primary) hypertension: Secondary | ICD-10-CM | POA: Diagnosis not present

## 2014-05-18 DIAGNOSIS — Z0000 Encounter for general adult medical examination without abnormal findings: Secondary | ICD-10-CM | POA: Diagnosis not present

## 2014-05-18 DIAGNOSIS — E7800 Pure hypercholesterolemia, unspecified: Secondary | ICD-10-CM | POA: Diagnosis not present

## 2014-05-30 DIAGNOSIS — N289 Disorder of kidney and ureter, unspecified: Secondary | ICD-10-CM | POA: Diagnosis not present

## 2014-05-30 DIAGNOSIS — E538 Deficiency of other specified B group vitamins: Secondary | ICD-10-CM | POA: Diagnosis not present

## 2014-06-16 ENCOUNTER — Encounter: Payer: Self-pay | Admitting: Interventional Cardiology

## 2014-06-16 ENCOUNTER — Ambulatory Visit (INDEPENDENT_AMBULATORY_CARE_PROVIDER_SITE_OTHER): Payer: Medicare Other | Admitting: Interventional Cardiology

## 2014-06-16 VITALS — BP 158/70 | HR 60 | Ht 67.0 in | Wt 147.0 lb

## 2014-06-16 DIAGNOSIS — E785 Hyperlipidemia, unspecified: Secondary | ICD-10-CM | POA: Diagnosis not present

## 2014-06-16 DIAGNOSIS — I10 Essential (primary) hypertension: Secondary | ICD-10-CM | POA: Diagnosis not present

## 2014-06-16 DIAGNOSIS — I209 Angina pectoris, unspecified: Secondary | ICD-10-CM

## 2014-06-16 DIAGNOSIS — I2510 Atherosclerotic heart disease of native coronary artery without angina pectoris: Secondary | ICD-10-CM | POA: Diagnosis not present

## 2014-06-16 DIAGNOSIS — I25119 Atherosclerotic heart disease of native coronary artery with unspecified angina pectoris: Secondary | ICD-10-CM

## 2014-06-16 NOTE — Patient Instructions (Signed)
Your physician recommends that you continue on your current medications as directed. Please refer to the Current Medication list given to you today.  You have been referred to [facility])  Your physician discussed the importance of regular exercise and recommended that you start or continue a regular exercise program for good health.  Your physician wants you to follow-up in: 1 year with Dr.Smith You will receive a reminder letter in the mail two months in advance. If you don't receive a letter, please call our office to schedule the follow-up appointment.

## 2014-06-16 NOTE — Progress Notes (Signed)
Patient ID: [name], female   DOB: [birth year], 80 y.o.   MRN: [id number]    [address], [address] [place], [place]  [address] Phone: [phone] Fax:  [phone]  Date:  06/16/2014   ID:  [name], DOB Apr 19, 1934, MRN [id number]  PCP:  Garlan Fair, MD   ASSESSMENT:  1. Coronary atherosclerosis, asymptomatic 2. Hypertension, essential And with poor systolic control on current measurement 3. Hyperlipidemia followed by primary care  PLAN:  1. No change in the current cardiovascular therapy. 2. Low salt diet 3. Increase in aerobic activity including planned exercise 4. Primary care establishment, I have recommended Dr.Jaralla who is in [place] at [place] where her previous primary care physician practiced.   SUBJECTIVE: [name] is a 80 y.o. female with history of nonobstructive coronary disease, and hypertension. She is now retired from the [employer]. She has had few if any cardiovascular symptoms. Her major complaint today is bilateral lower extremity tingling and numbness. She has not had edema. She denies calf and thigh discomfort with ambulation. There've been no prolonged episodes of palpitation. She denies orthopnea PND. No focal neurological complaints. No history of diabetes but she has had part of her distal ileum removed surgically. There is a chance that she could be vitamin D deficient. She is no known history of diabetes. She does not have a primary care physician since Dr. Timmothy Euler retired.   Wt Readings from Last 3 Encounters:  06/16/14 147 lb (66.679 kg)  02/11/14 151 lb 6.4 oz (68.675 kg)  12/17/13 151 lb 6.4 oz (68.675 kg)     Past Medical History  Diagnosis Date  . Hypertension   . Hyperlipidemia   . GERD (gastroesophageal reflux disease)   . Heart murmur   . Ureteral calculi     bilateral  . History of breast cancer ONCOLOGIST-- DR Benay Spice    DX 2010--  RIGHT BREAST DCIS  HIGH GRADE S/P LUMPECTOMY W/  SLN BX--  NO RECURRENCE  . 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 History of skin cancer     excision basal cell  . Frequency of urination   . Urgency of urination   . Nocturia   . Dysuria   . Kidney stones 7/14  . CAD (coronary artery disease)   . 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 Ovarian carcinoma     papillary serous-low grade, recurrence found in fat necrosis during ileostomy    Current Outpatient Prescriptions  Medication Sig Dispense Refill  . amLODipine (NORVASC) 5 MG tablet TAKE 1 TABLET DAILY  90 tablet  0  . aspirin 81 MG tablet Take 81 mg by mouth daily.      . Biotin 5000 MCG CAPS Take 10,000 mcg by mouth daily.      . Calcium Carbonate-Vitamin D (CALTRATE 600+D PO) Take 1 tablet by mouth 2 (two) times daily.      . carvedilol (COREG) 12.5 MG tablet Take 1 tablet (12.5 mg total) by mouth 2 (two) times daily with a meal.  180 tablet  2  . cholecalciferol (VITAMIN D) 1000 UNITS tablet Take 2,000 Units by mouth daily.       . diphenoxylate-atropine (LOMOTIL) 2.5-0.025 MG per tablet Take 12.5 tablets by mouth 4 (four) times daily as needed for diarrhea or loose stools.      Marland Kitchen  esomeprazole (NEXIUM) 40 MG capsule Take 40 mg by mouth daily before breakfast.      . hydrochlorothiazide (HYDRODIURIL) 12.5 MG tablet Take 1 tablet (12.5 mg total) by mouth daily.  90 tablet  2  . hydrocortisone (ANUSOL-HC) 2.5 % rectal cream Place 1 application rectally 2 (two) times daily as needed for hemorrhoids or itching.  60 g  12  . lidocaine (XYLOCAINE) 2 % jelly APPLY TO AFFECTED AREA AS NEEDED.  30 mL  0  . Multiple Vitamins-Minerals (MULTIVITAMIN PO) Take 1 tablet by mouth daily.      . valsartan (DIOVAN) 320 MG tablet Take 320 mg by mouth every morning.       No current facility-administered medications for this visit.     Allergies:   No Known Allergies  Social History:  The patient  reports that she has never smoked. She has never used smokeless tobacco. She reports that she does not drink alcohol or use illicit drugs.   ROS:  Please see the history of present illness.   Denies diarrhea. No orthopnea PND.   All other systems reviewed and negative.   OBJECTIVE: VS:  BP 158/70  Pulse 60  Ht 5\' 7"  (1.702 m)  Wt 147 lb (66.679 kg)  BMI 23.02 kg/m2 Well nourished, well developed, in no acute distress, appears healthy and younger than stated age 30: normal Neck: JVD flat. Carotid bruit absent  Cardiac:  normal S1, S2; RRR; no murmur Lungs:  clear to auscultation bilaterally, no wheezing, rhonchi or rales Abd: soft, nontender, no hepatomegaly Ext: Edema absent. Pulses 2+ and symmetric Skin: warm and dry Neuro:  CNs 2-12 intact, no focal abnormalities noted  EKG:  Normal sinus rhythm with nonspecific T wave flattening       Signed, Illene Labrador III, MD 06/16/2014 3:56 PM   Past Medical History  Irritable bowel syndrome   Cholecystitis status post laparoscopic cholecystectomy   GERD   Intermittent bronchitis   Hypertension   Psammomatous tumors throughout the abdomen seen initially in 1977 during hysterectomy, now involving the entire peritoneum and invading the ascending colon resulting in a right hemicolectomy, terminal ileal resection and mucous fistula, eventually closed with short bowel syndrome   Short-bowel syndrome leading to dehydration   Anxiety   Hypomagnesemia   Coronary artery disease documented by angiography 2008, 50% LAD, 50% diagonal.   cataracts- Dr Bing Plume

## 2014-07-04 DIAGNOSIS — E538 Deficiency of other specified B group vitamins: Secondary | ICD-10-CM | POA: Diagnosis not present

## 2014-07-22 DIAGNOSIS — R202 Paresthesia of skin: Secondary | ICD-10-CM | POA: Diagnosis not present

## 2014-07-22 DIAGNOSIS — Z23 Encounter for immunization: Secondary | ICD-10-CM | POA: Diagnosis not present

## 2014-07-22 DIAGNOSIS — G629 Polyneuropathy, unspecified: Secondary | ICD-10-CM | POA: Diagnosis not present

## 2014-07-28 ENCOUNTER — Other Ambulatory Visit: Payer: Self-pay | Admitting: Interventional Cardiology

## 2014-08-03 ENCOUNTER — Telehealth: Payer: Self-pay | Admitting: Gynecology

## 2014-08-03 NOTE — Telephone Encounter (Signed)
Call to pt lm with spouse to call back to rs appt

## 2014-08-08 ENCOUNTER — Encounter: Payer: Self-pay | Admitting: Interventional Cardiology

## 2014-08-08 ENCOUNTER — Telehealth: Payer: Self-pay

## 2014-08-08 DIAGNOSIS — E538 Deficiency of other specified B group vitamins: Secondary | ICD-10-CM | POA: Diagnosis not present

## 2014-08-08 DIAGNOSIS — L089 Local infection of the skin and subcutaneous tissue, unspecified: Secondary | ICD-10-CM | POA: Diagnosis not present

## 2014-08-08 DIAGNOSIS — L720 Epidermal cyst: Secondary | ICD-10-CM | POA: Diagnosis not present

## 2014-08-08 NOTE — Telephone Encounter (Signed)
Spoke with patient's PCP office who states patient was seen today for office visit and has an "infected epidermal cyst on her left labia." Would like to schedule appointment for patient to be seen with our office tomorrow for evaluation. Appointment scheduled for tomorrow at 12:30pm with Dr.Miller (time per Gay Filler). Patient is agreeable to date and time.  Routing to provider for final review. Patient agreeable to disposition. Will close encounter

## 2014-08-09 ENCOUNTER — Telehealth: Payer: Self-pay

## 2014-08-09 ENCOUNTER — Telehealth: Payer: Self-pay | Admitting: *Deleted

## 2014-08-09 ENCOUNTER — Encounter: Payer: Self-pay | Admitting: Obstetrics & Gynecology

## 2014-08-09 ENCOUNTER — Ambulatory Visit (INDEPENDENT_AMBULATORY_CARE_PROVIDER_SITE_OTHER): Payer: Medicare Other | Admitting: Obstetrics & Gynecology

## 2014-08-09 VITALS — BP 140/62 | HR 70 | Resp 16 | Ht 67.0 in | Wt 148.0 lb

## 2014-08-09 DIAGNOSIS — I25119 Atherosclerotic heart disease of native coronary artery with unspecified angina pectoris: Secondary | ICD-10-CM | POA: Diagnosis not present

## 2014-08-09 DIAGNOSIS — N9089 Other specified noninflammatory disorders of vulva and perineum: Secondary | ICD-10-CM

## 2014-08-09 DIAGNOSIS — N949 Unspecified condition associated with female genital organs and menstrual cycle: Secondary | ICD-10-CM | POA: Diagnosis not present

## 2014-08-09 MED ORDER — CEPHALEXIN 500 MG PO CAPS
500.0000 mg | ORAL_CAPSULE | Freq: Four times a day (QID) | ORAL | Status: DC
Start: 2014-08-09 — End: 2014-08-22

## 2014-08-09 MED ORDER — CEPHALEXIN 500 MG PO CAPS: 500.0000 mg | ORAL_CAPSULE | Freq: Four times a day (QID) | ORAL | Status: DC

## 2014-08-09 NOTE — Progress Notes (Signed)
Excision of vulvar cyst scheduled for 08-22-14 at 1115 at [facility]. Surgery instruction sheet reviewed with patient and printed copy given. Patient agreeable to date and time.

## 2014-08-09 NOTE — Telephone Encounter (Signed)
Patient in office for appointment with Dr Sabra Heck. Surgery, excision of vulvar cyst,  scheduled for 08-22-14 at 83 at [facility]. Surgical instruction sheet reviewed and printed copy given to patient. Call prn.  Routing to provider for final review. Patient agreeable to disposition. Will close encounter

## 2014-08-09 NOTE — Telephone Encounter (Signed)
Spoke with patient at time of incoming call. Patient states that she came in this morning to see Dr.Miller and was supposed to have a prescription sent to her pharmacy but it is not yet available. Patient is calling to check on status to make sure rx was sent in. Per OV note from [facility] patient is to start on Keflex 500mg  QID x 7 days see office visit note from today. [facility]. [facility] be sent to [facility]. Order placed for keflex 500 mg QID x7 days to [facility]. Patient is agreeable and verbalizes understanding.  Routing to provider for final review. Patient agreeable to disposition. Will close encounter

## 2014-08-09 NOTE — Progress Notes (Signed)
Subjective:     Patient ID: [name], female   DOB: [birth year], 80 y.o.   MRN: [id number]  HPI 80 yo MWF with recurrent vulvar cyst, probable sebceous per hx, that pt has intermittent issues with "when infected".  Area is more tender over the last several days.  No drainage.  Pt can "press" on the lesion and get out thick drainage but lesion is too tender right now.  No fevers.    Review of Systems  All other systems reviewed and are negative.      Objective:   Physical Exam  Constitutional: She is oriented to person, place, and time. She appears well-developed and well-nourished.  Genitourinary: Vagina normal.    There is no rash, tenderness or lesion on the right labia. There is tenderness and lesion on the left labia. There is no rash on the left labia.  Lymphadenopathy:       Right: No inguinal adenopathy present.       Left: No inguinal adenopathy present.  Neurological: She is alert and oriented to person, place, and time.  Skin: Skin is warm and dry.  Psychiatric: She has a normal mood and affect.       Assessment:     Large sebaceous cyst, possible mild cellulitis     Plan:     Keflex 500mg  QID x 7 days.  Plan removal of vulvar sebaceous cyst in OR d/w pt as lesion is fairly large.  I don't think she will tolerate removal in the office very well and there is a much higher chance in the office that I will not be able to fully remove cyst wall.  Pt understands this may not be fully possible in OR as well, so recurrence is a risk.  Infection, bleeding, need for future procedures d/w pt as well.  Pt voices understanding and desire to proceed.  All questions answered.  Will have procedure scheduled.

## 2014-08-12 ENCOUNTER — Ambulatory Visit: Payer: Medicare Other | Admitting: Gynecology

## 2014-08-16 ENCOUNTER — Encounter (HOSPITAL_COMMUNITY): Payer: Self-pay

## 2014-08-16 ENCOUNTER — Encounter (HOSPITAL_COMMUNITY)
Admission: RE | Admit: 2014-08-16 | Discharge: 2014-08-16 | Disposition: A | Discharge status: Home / Self Care | Payer: Medicare Other | Source: Ambulatory Visit | Attending: Obstetrics & Gynecology | Admitting: Obstetrics & Gynecology

## 2014-08-16 ENCOUNTER — Encounter (INDEPENDENT_AMBULATORY_CARE_PROVIDER_SITE_OTHER): Payer: Self-pay

## 2014-08-16 DIAGNOSIS — Z01812 Encounter for preprocedural laboratory examination: Secondary | ICD-10-CM | POA: Insufficient documentation

## 2014-08-16 LAB — CBC
HEMATOCRIT: 34.7 % — AB (ref 36.0–46.0)
Hemoglobin: 12 g/dL (ref 12.0–15.0)
MCH: 31.1 pg (ref 26.0–34.0)
MCHC: 34.6 g/dL (ref 30.0–36.0)
MCV: 89.9 fL (ref 78.0–100.0)
Platelets: 281 10*3/uL (ref 150–400)
RBC: 3.86 MIL/uL — ABNORMAL LOW (ref 3.87–5.11)
RDW: 13.3 % (ref 11.5–15.5)
WBC: 7.4 10*3/uL (ref 4.0–10.5)

## 2014-08-16 LAB — BASIC METABOLIC PANEL
Anion gap: 13 (ref 5–15)
BUN: 29 mg/dL — AB (ref 6–23)
CALCIUM: 9.3 mg/dL (ref 8.4–10.5)
CO2: 21 mEq/L (ref 19–32)
CREATININE: 1.18 mg/dL — AB (ref 0.50–1.10)
Chloride: 106 mEq/L (ref 96–112)
GFR calc non Af Amer: 43 mL/min — ABNORMAL LOW (ref >90)
GFR, EST AFRICAN AMERICAN: 50 mL/min — AB (ref >90)
Glucose, Bld: 134 mg/dL — ABNORMAL HIGH (ref 70–99)
Potassium: 3.5 mEq/L — ABNORMAL LOW (ref 3.7–5.3)
Sodium: 140 mEq/L (ref 137–147)

## 2014-08-16 NOTE — Patient Instructions (Addendum)
Your procedure is scheduled on:08/22/14  Enter through the Main Entrance at :9:45 am Pick up desk phone and dial [phone] and inform us of your arrival.  Please call [phone] if you have any problems the morning of surgery.  Remember: Do not eat food after midnight:Sunday Clear liquids are ok until:7 am on Monday 08/22/14   You may brush your teeth the morning of surgery.  Take these meds the morning of surgery with a sip of water:Nexium, Diovan, HCTZ  DO NOT wear jewelry, eye make-up, lipstick,body lotion, or dark fingernail polish.  (Polished toes are ok) You may wear deodorant.  If you are to be admitted after surgery, leave suitcase in car until your room has been assigned. Patients discharged on the day of surgery will not be allowed to drive home. Wear loose fitting, comfortable clothes for your ride home.

## 2014-08-21 ENCOUNTER — Encounter: Payer: Self-pay | Admitting: Obstetrics & Gynecology

## 2014-08-22 ENCOUNTER — Ambulatory Visit (HOSPITAL_COMMUNITY): Payer: Medicare Other | Admitting: Anesthesiology

## 2014-08-22 ENCOUNTER — Encounter (HOSPITAL_COMMUNITY)
Admission: RE | Disposition: A | Discharge status: Home / Self Care | Payer: Self-pay | Source: Ambulatory Visit | Attending: Obstetrics & Gynecology

## 2014-08-22 ENCOUNTER — Ambulatory Visit (HOSPITAL_COMMUNITY)
Admission: RE | Admit: 2014-08-22 | Discharge: 2014-08-22 | Disposition: A | Discharge status: Home / Self Care | Payer: Medicare Other | Source: Ambulatory Visit | Attending: Obstetrics & Gynecology | Admitting: Obstetrics & Gynecology

## 2014-08-22 ENCOUNTER — Encounter (HOSPITAL_COMMUNITY): Payer: Self-pay | Admitting: Anesthesiology

## 2014-08-22 DIAGNOSIS — N907 Vulvar cyst: Secondary | ICD-10-CM | POA: Insufficient documentation

## 2014-08-22 DIAGNOSIS — I10 Essential (primary) hypertension: Secondary | ICD-10-CM | POA: Insufficient documentation

## 2014-08-22 DIAGNOSIS — I2510 Atherosclerotic heart disease of native coronary artery without angina pectoris: Secondary | ICD-10-CM | POA: Diagnosis not present

## 2014-08-22 DIAGNOSIS — L720 Epidermal cyst: Secondary | ICD-10-CM | POA: Diagnosis not present

## 2014-08-22 DIAGNOSIS — N9089 Other specified noninflammatory disorders of vulva and perineum: Secondary | ICD-10-CM | POA: Diagnosis not present

## 2014-08-22 DIAGNOSIS — L723 Sebaceous cyst: Secondary | ICD-10-CM | POA: Diagnosis not present

## 2014-08-22 HISTORY — PX: VULVAR LESION REMOVAL: SHX5391

## 2014-08-22 SURGERY — VULVAR LESION
Anesthesia: Monitor Anesthesia Care | Site: Perineum

## 2014-08-22 MED ORDER — DEXAMETHASONE SODIUM PHOSPHATE 4 MG/ML IJ SOLN
INTRAMUSCULAR | Status: AC
  Filled 2014-08-22: qty 1

## 2014-08-22 MED ORDER — FENTANYL CITRATE 0.05 MG/ML IJ SOLN
25.0000 ug | INTRAMUSCULAR | Status: DC | PRN
  Administered 2014-08-22 (×2): 25 ug via INTRAVENOUS

## 2014-08-22 MED ORDER — ONDANSETRON HCL 4 MG/2ML IJ SOLN
INTRAMUSCULAR | Status: DC | PRN
  Administered 2014-08-22: 4 mg via INTRAVENOUS

## 2014-08-22 MED ORDER — FENTANYL CITRATE 0.05 MG/ML IJ SOLN
INTRAMUSCULAR | Status: AC
  Filled 2014-08-22: qty 2

## 2014-08-22 MED ORDER — LIDOCAINE HCL (CARDIAC) 20 MG/ML IV SOLN
INTRAVENOUS | Status: AC
  Filled 2014-08-22: qty 5

## 2014-08-22 MED ORDER — PROPOFOL 10 MG/ML IV EMUL
INTRAVENOUS | Status: DC | PRN
  Administered 2014-08-22: 100 ug/kg/min via INTRAVENOUS

## 2014-08-22 MED ORDER — HYDROCODONE-ACETAMINOPHEN 5-325 MG PO TABS
1.0000 | ORAL_TABLET | Freq: Once | ORAL | Status: AC
  Administered 2014-08-22: 1 via ORAL

## 2014-08-22 MED ORDER — LIDOCAINE HCL 1 % IJ SOLN
INTRAMUSCULAR | Status: DC | PRN
  Administered 2014-08-22: 3 mL

## 2014-08-22 MED ORDER — SCOPOLAMINE 1 MG/3DAYS TD PT72: 1.0000 | MEDICATED_PATCH | Freq: Once | TRANSDERMAL | Status: DC

## 2014-08-22 MED ORDER — CEFAZOLIN SODIUM-DEXTROSE 2-3 GM-% IV SOLR
INTRAVENOUS | Status: AC
  Filled 2014-08-22: qty 50

## 2014-08-22 MED ORDER — MIDAZOLAM HCL 2 MG/2ML IJ SOLN: 0.5000 mg | Freq: Once | INTRAMUSCULAR | Status: DC | PRN

## 2014-08-22 MED ORDER — LACTATED RINGERS IV SOLN
INTRAVENOUS | Status: DC
  Administered 2014-08-22 (×2): via INTRAVENOUS

## 2014-08-22 MED ORDER — MEPERIDINE HCL 25 MG/ML IJ SOLN: 6.2500 mg | INTRAMUSCULAR | Status: DC | PRN

## 2014-08-22 MED ORDER — KETOROLAC TROMETHAMINE 30 MG/ML IJ SOLN: 15.0000 mg | Freq: Once | INTRAMUSCULAR | Status: DC | PRN

## 2014-08-22 MED ORDER — DEXAMETHASONE SODIUM PHOSPHATE 10 MG/ML IJ SOLN
INTRAMUSCULAR | Status: DC | PRN
  Administered 2014-08-22: 4 mg via INTRAVENOUS

## 2014-08-22 MED ORDER — HYDROCODONE-ACETAMINOPHEN 5-325 MG PO TABS: 1.0000 | ORAL_TABLET | Freq: Four times a day (QID) | ORAL | Status: DC | PRN

## 2014-08-22 MED ORDER — HYDROCODONE-ACETAMINOPHEN 5-325 MG PO TABS
ORAL_TABLET | ORAL | Status: AC
  Filled 2014-08-22: qty 1

## 2014-08-22 MED ORDER — CEFAZOLIN SODIUM-DEXTROSE 2-3 GM-% IV SOLR
2.0000 g | INTRAVENOUS | Status: AC
  Administered 2014-08-22: 2 g via INTRAVENOUS

## 2014-08-22 MED ORDER — LIDOCAINE HCL (CARDIAC) 20 MG/ML IV SOLN
INTRAVENOUS | Status: DC | PRN
  Administered 2014-08-22: 50 mg via INTRAVENOUS

## 2014-08-22 MED ORDER — FENTANYL CITRATE 0.05 MG/ML IJ SOLN
INTRAMUSCULAR | Status: DC | PRN
  Administered 2014-08-22 (×2): 50 ug via INTRAVENOUS

## 2014-08-22 MED ORDER — PROMETHAZINE HCL 25 MG/ML IJ SOLN: 6.2500 mg | INTRAMUSCULAR | Status: DC | PRN

## 2014-08-22 MED ORDER — PROPOFOL 10 MG/ML IV EMUL
INTRAVENOUS | Status: AC
  Filled 2014-08-22: qty 40

## 2014-08-22 MED ORDER — ONDANSETRON HCL 4 MG/2ML IJ SOLN
INTRAMUSCULAR | Status: AC
  Filled 2014-08-22: qty 2

## 2014-08-22 MED ORDER — LIDOCAINE HCL 1 % IJ SOLN
INTRAMUSCULAR | Status: AC
  Filled 2014-08-22: qty 20

## 2014-08-22 SURGICAL SUPPLY — 25 items
BLADE SURG 15 STRL LF C SS BP (BLADE) ×1 IMPLANT
BLADE SURG 15 STRL SS (BLADE) ×2
CLOTH BEACON ORANGE TIMEOUT ST (SAFETY) ×3 IMPLANT
COUNTER NEEDLE 1200 MAGNETIC (NEEDLE) ×3 IMPLANT
DECANTER SPIKE VIAL GLASS SM (MISCELLANEOUS) ×3 IMPLANT
ELECT REM PT RETURN 9FT ADLT (ELECTROSURGICAL) ×3
ELECTRODE REM PT RTRN 9FT ADLT (ELECTROSURGICAL) ×1 IMPLANT
GLOVE BIOGEL PI IND STRL 7.0 (GLOVE) ×1 IMPLANT
GLOVE BIOGEL PI INDICATOR 7.0 (GLOVE) ×2
GLOVE ECLIPSE 6.5 STRL STRAW (GLOVE) ×6 IMPLANT
GLOVE SURG SS PI 7.0 STRL IVOR (GLOVE) ×12 IMPLANT
GOWN STRL REUS W/TWL LRG LVL3 (GOWN DISPOSABLE) ×9 IMPLANT
NEEDLE HYPO 25X1 1.5 SAFETY (NEEDLE) ×3 IMPLANT
PACK VAGINAL MINOR WOMEN LF (CUSTOM PROCEDURE TRAY) ×3 IMPLANT
PAD OB MATERNITY 4.3X12.25 (PERSONAL CARE ITEMS) ×6 IMPLANT
PENCIL BUTTON HOLSTER BLD 10FT (ELECTRODE) ×3 IMPLANT
SUT VIC AB 3-0 PS2 18 (SUTURE) ×2
SUT VIC AB 3-0 PS2 18XBRD (SUTURE) ×1 IMPLANT
SUT VIC AB 3-0 SH 27 (SUTURE) ×2
SUT VIC AB 3-0 SH 27X BRD (SUTURE) ×1 IMPLANT
SYRINGE 20CC LL (MISCELLANEOUS) ×3 IMPLANT
TOWEL OR 17X24 6PK STRL BLUE (TOWEL DISPOSABLE) ×6 IMPLANT
TUBING NON-CON 1/4 X 20 CONN (TUBING) ×2 IMPLANT
TUBING NON-CON 1/4 X 20' CONN (TUBING) ×1
YANKAUER SUCT BULB TIP NO VENT (SUCTIONS) ×3 IMPLANT

## 2014-08-22 NOTE — Anesthesia Preprocedure Evaluation (Addendum)
Anesthesia Evaluation  Patient identified by MRN, date of birth, ID band Patient awake    Reviewed: Allergy & Precautions, H&P , NPO status , Patient's Chart, lab work & pertinent test results  Airway Mallampati: I  TM Distance: >3 FB Neck ROM: full    Dental no notable dental hx.    Pulmonary neg pulmonary ROS,    Pulmonary exam normal       Cardiovascular hypertension, Pt. on home beta blockers and Pt. on medications + CAD     Neuro/Psych negative neurological ROS  negative psych ROS   GI/Hepatic Neg liver ROS,   Endo/Other  negative endocrine ROS  Renal/GU      Musculoskeletal   Abdominal Normal abdominal exam  (+)   Peds  Hematology negative hematology ROS (+)   Anesthesia Other Findings   Reproductive/Obstetrics                            Anesthesia Physical Anesthesia Plan  ASA: III  Anesthesia Plan: MAC   Post-op Pain Management:    Induction: Intravenous  Airway Management Planned:   Additional Equipment:   Intra-op Plan:   Post-operative Plan:   Informed Consent: I have reviewed the patients History and Physical, chart, labs and discussed the procedure including the risks, benefits and alternatives for the proposed anesthesia with the patient or authorized representative who has indicated his/her understanding and acceptance.     Plan Discussed with: CRNA and Surgeon  Anesthesia Plan Comments:         Anesthesia Quick Evaluation

## 2014-08-22 NOTE — H&P (Signed)
[name] is an 80 y.o. female with a large sebaceous cyst here for removal under anesthesia.  Due to size of lesion, I did not feel an attempt in the office was best for complete excision.  I did discuss with pt the option of doing this in the office but after description of this to pt, she agreed and desired excision under anesthesia.  Risks, benefits all discussed with pt.  She is here and ready to proceed.  Pertinent Gynecological History: Menses: post-menopausal Bleeding: none Contraception: PMP DES exposure: denies Blood transfusions: none Sexually transmitted diseases: no past history Previous GYN Procedures: none  Last mammogram: normal Date: 03/28/14   Menstrual History: No LMP recorded. Patient has had a hysterectomy.    Past Medical History  Diagnosis Date  . Hypertension   . Hyperlipidemia   . GERD (gastroesophageal reflux disease)   . Heart murmur   . Ureteral calculi     bilateral  . History of breast cancer ONCOLOGIST-- DR Benay Spice    DX 2010--  RIGHT BREAST DCIS  HIGH GRADE S/P LUMPECTOMY W/ SLN BX--  NO RECURRENCE  . History of skin cancer     excision basal cell  . Frequency of urination   . Urgency of urination   . Nocturia   . Dysuria   . Kidney stones 7/14  . CAD (coronary artery disease)   . 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 Ovarian carcinoma     papillary serous-low grade, recurrence found in fat necrosis during ileostomy  . Breast cancer 2010    Past Surgical History  Procedure Laterality Date  . Total abdominal hysterectomy w/ bilateral salpingoophorectomy  1976  . Cholecystectomy  1990  . Appendectomy    . Wound debridement  05/28/2012    Procedure: DEBRIDEMENT ABDOMINAL WOUND;  Surgeon: Haywood Lasso, MD;  Location: [place];  Service: General;  Laterality: N/A;  excision chronic wound abdominal wall  . Cystoscopy with stent placement Bilateral 04/07/2013    Procedure: CYSTOSCOPY WITH STENT PLACEMENT;  Surgeon: Alexis Frock, MD;  Location: [facility];  Service: Urology;  Laterality: Bilateral;  . Cystoscopy with biopsy N/A 04/07/2013    Procedure: CYSTOSCOPY WITH BIOPSY and fulgeration;  Surgeon: Alexis Frock, MD;  Location: [facility];  Service: Urology;  Laterality: N/A;  . Excision right breast mass  02-10-2002  . Dx laparoscopy w/ peritoneal and omental bx's and washings  02-16-2008  . Exp. lap. extensive adhesiolysis/ resection terminal ileum , ascending and descending colon with creation ileostomy and mucous fistula  02-19-2008    PERFERATION AND ABD. CANCER--  TAKEDOWN ILEOSTOMY 08-23-2008  . Cardiac catheterization  11-11-2005 DR Daneen Schick    MODERATE LAD DISEASE/ NORMAL LVF/ EF 65-75%  . Breast lumpectomy Right 04-13-2009    W/ SLN BX  . Cataract extraction w/ intraocular lens implant Left 04-26-2013  . Cystoscopy with retrograde pyelogram, ureteroscopy and stent placement Bilateral 05/07/2013    Procedure: CYSTOSCOPY WITH RETROGRADE PYELOGRAM, URETEROSCOPY ;  Surgeon: Alexis Frock, MD;  Location: [facility];  Service: Urology;  Laterality: Bilateral;  . Cystoscopy w/ ureteral stent placement Bilateral 05/07/2013    Procedure: CYSTOSCOPY WITH STENT REPLACEMENTS;  Surgeon: Alexis Frock, MD;  Location: [facility];  Service: Urology;  Laterality: Bilateral;  .  Total abdominal hysterectomy w/ bilateral salpingoophorectomy  1976    Family History  Problem Relation Age of Onset  . Breast cancer Sister   . Cancer Sister     breast  . Heart disease Mother   . Hyperlipidemia Mother     Social History:  reports that she has never smoked. She has never used smokeless tobacco. She reports that she does not drink alcohol or use illicit drugs.  Allergies: No Known  Allergies  Prescriptions prior to admission  Medication Sig Dispense Refill Last Dose  . amLODipine (NORVASC) 5 MG tablet Take 5 mg by mouth daily.   08/21/2014 at 1800  . aspirin 81 MG tablet Take 81 mg by mouth daily.   Past Month at Unknown time  . Biotin (BIOTIN MAXIMUM STRENGTH) 10 MG TABS Take 1 tablet by mouth daily.   08/21/2014 at Unknown time  . Calcium Carbonate-Vitamin D (CALTRATE 600+D PO) Take 1 tablet by mouth 2 (two) times daily.   08/21/2014 at Unknown time  . carvedilol (COREG) 12.5 MG tablet Take 1 tablet (12.5 mg total) by mouth 2 (two) times daily with a meal. 180 tablet 2 08/22/2014 at 0800  . cephALEXin (KEFLEX) 500 MG capsule Take 1 capsule (500 mg total) by mouth 4 (four) times daily. Take QID for 7 days. 28 capsule 0   . Cholecalciferol (VITAMIN D) 2000 UNITS tablet Take 2,000 Units by mouth daily.   08/21/2014 at Unknown time  . esomeprazole (NEXIUM) 40 MG capsule Take 40 mg by mouth daily before breakfast.   08/22/2014 at 0800  . hydrochlorothiazide (HYDRODIURIL) 12.5 MG tablet Take 1 tablet (12.5 mg total) by mouth daily. 90 tablet 2 08/22/2014 at 0800  . hydrocortisone (ANUSOL-HC) 2.5 % rectal cream Place 1 application rectally 2 (two) times daily as needed for hemorrhoids or itching. 60 g 12 08/22/2014 at Unknown time  . lidocaine (XYLOCAINE) 2 % jelly Apply 1 application topically as needed.   08/21/2014 at Unknown time  . Magnesium 400 MG TABS Take 1 tablet by mouth daily.   08/21/2014 at Unknown time  . Multiple Vitamins-Minerals (MULTIVITAMIN PO) Take 1 tablet by mouth daily.   08/21/2014 at Unknown time  . Multiple Vitamins-Minerals (PRESERVISION AREDS 2 PO) Take 1 tablet by mouth daily.   08/21/2014 at Unknown time  . valsartan (DIOVAN) 320 MG tablet Take 320 mg by mouth every morning.   08/22/2014 at 0800  . amLODipine (NORVASC) 5 MG tablet TAKE 1 TABLET DAILY (PLEASE CALL TO SCHEDULE AN APPOINTMENT [phone]) (Patient not taking: Reported on 08/11/2014) 90  tablet 0 Taking  . Biotin 5000 MCG CAPS Take 10,000 mcg by mouth daily.   Taking  . cholecalciferol (VITAMIN D) 1000 UNITS tablet Take 2,000 Units by mouth daily.    Taking  . lidocaine (XYLOCAINE) 2 % jelly APPLY TO AFFECTED AREA AS NEEDED. (Patient not taking: Reported on 08/11/2014) 30 mL 0 Taking    Review of Systems  All other systems reviewed and are negative.   Blood pressure 150/60, pulse 69, temperature 97.9 F (36.6 C), temperature source Oral, resp. rate 18, SpO2 97 %. Physical Exam  Constitutional: She appears well-developed and well-nourished.  Cardiovascular: Normal rate and regular rhythm.   Respiratory: Effort normal and breath sounds normal.  Skin: Skin is warm and dry.  Psychiatric: She has a normal mood and affect.    No results found for this or any previous visit (from the past 24 hour(s)).  No results found.  Assessment/Plan: 80  yo WF with large sebaceous cyst here for excision under anesthesia.  Risks and benefits d/w pt.  She is ready to proceed.  Hale Bogus SUZANNE 08/22/2014, 12:20 PM

## 2014-08-22 NOTE — Transfer of Care (Signed)
Immediate Anesthesia Transfer of Care Note  Patient: [name]  Procedure(s) Performed: Procedure(s): EXCISION OF VULVAR CYST   (N/A)  Patient Location: PACU  Anesthesia Type:MAC  Level of Consciousness: awake, alert  and oriented  Airway & Oxygen Therapy: Patient Spontanous Breathing and Patient connected to nasal cannula oxygen  Post-op Assessment: Report given to PACU RN and Post -op Vital signs reviewed and stable  Post vital signs: Reviewed and stable  Complications: No apparent anesthesia complications

## 2014-08-22 NOTE — Op Note (Signed)
08/22/2014  1:17 PM  PATIENT:  [name]  80 y.o. female  PRE-OPERATIVE DIAGNOSIS:  vulvar cyst  POST-OPERATIVE DIAGNOSIS:  2.5cm sebaceous cyst  PROCEDURE:  Procedure(s): EXCISION OF VULVAR CYST    SURGEON:  Tammie Yanda SUZANNE  ASSISTANTS: OR staff   ANESTHESIA:   MAC  ESTIMATED BLOOD LOSS: 5cc  BLOOD ADMINISTERED:none   FLUIDS: 500cc LR  UOP: 100cc drained with I&O cath  SPECIMEN:  Vulvar cyst wall and cyst material  DISPOSITION OF SPECIMEN:  PATHOLOGY  FINDINGS: 2.5cm left vulvar cyst, no erythema present now  DESCRIPTION OF OPERATION: Pt was taken to the OR.  Informed consent was present and on the chart.  Pt was placed in the supine position and legs were then positioned in the low lithotomy position in [place] stirrups.  SCDs were on her lower extremities and functioning properly. Anesthesia was administered by the anesthesia staff without difficulty.  Perineum was prepped with Betadine prep 3. The lesion was much smaller than in the office before airbags were given. And now measures about 2.5 cm. Still lesion is deep and I feel excision in the OR as the best way to prevent this from recurring for the patient. The procedure was continued. The lesion was opened with a #15 blade. A large amount of sebaceous material was drained from lesion. Starting superiorly the cyst wall was grasped and elevated and using curved Mayo scissors and times a #15 blade the entire cyst wall was excised. This and the cyst material was sent to pathology. The inferior edge of the cyst, where epithelialization was present, was excised to provide a fresh sharp edge for skin closure.  3 interrupted subcutaneous stitches were used to bring the subjacent tissue back together and help prevent seroma formation. The skin was then closed with 4 simple interrupted sutures of #3.0 Vicryl.  3 cc 1% lidocaine was instilled of the lesion. A dressing was applied. The Betadine prep was cleansed off the skin.  The legs are positioned back in the supine position. Sponge, lap, needle, and instrument counts were correct 2. The patient tolerated the procedure well. And was awakened from anesthesia. She was taken to the recovery room in stable condition.  COUNTS:  YES  PLAN OF CARE: Transfer to PACU

## 2014-08-22 NOTE — Discharge Instructions (Signed)
Post-surgical Instructions, Outpatient Surgery  You may expect to feel dizzy, weak, and drowsy for as long as 24 hours after receiving the medicine that made you sleep (anesthetic). For the first 24 hours after your surgery:    Do not drive a car, ride a bicycle, participate in physical activities, or take public transportation until you are done taking narcotic pain medicines or as directed by Dr. Sabra Heck.   Do not drink alcohol or take tranquilizers.   Do not take medicine that has not been prescribed by your physicians.   Do not sign important papers or make important decisions while on narcotic pain medicines.   Have a responsible person with you.   CARE OF INCISION  If you have a bandage, you may remove it in one day.  If there are steri-strips or dermabond, just let this loosen on its own.   You may shower on the first day after your surgery.  Do not sit in a tub bath for one week.  Avoid heavy lifting (more than 10 pounds/4.5 kilograms), pushing, or pulling.   Avoid activities that may risk injury to your incisions.   PAIN MANAGEMENT  Motrin 800mg .  (This is the same as 4-200mg  over the counter tablets of Motrin or ibuprofen.)  You may take this every eight hours or as needed for cramping.    Vicodin 5/500mg .  For more severe pain, take one or two tablets every four to six hours as needed for pain control.  (Remember that narcotic pain medications increase your risk of constipation.  If this becomes a problem, you may take an over the counter stool softener like Colace 100mg  up to four times a day.)  DO'S AND DON'T'S  Do not take a tub bath for one week.  You may shower on the SECOND day after your surgery.  Take off the dressing before you shower.  Use another dressing only if there is still drainage.  Do not do any heavy lifting for one to two weeks.  This increases the chance of bleeding.  Do move around as you feel able.  Stairs are fine.  You may begin to exercise again  as you feel able.  Do not lift any weights for two weeks.   REGULAR MEDIATIONS/VITAMINS:  You may restart all of your regular medications as prescribed.  Please wait three days before restarting your aspirin.  You may restart all of your vitamins as you normally take them.    PLEASE CALL OR SEEK MEDICAL CARE IF:  You have persistent nausea and vomiting.   You have trouble eating or drinking.   You have an oral temperature above 100.5.   You have constipation that is not helped by adjusting diet or increasing fluid intake. Pain medicines are a common cause of constipation.   You have redness or drainage from your incision or there is increasing pain or tenderness near or in the surgical site.

## 2014-08-22 NOTE — Addendum Note (Signed)
Addendum  created 08/22/14 1357 by Rudean Curt, MD   Modules edited: Orders, PRL Based Order Sets

## 2014-08-22 NOTE — Anesthesia Postprocedure Evaluation (Signed)
Anesthesia Post Note  Patient: [name]  Procedure(s) Performed: Procedure(s) (LRB): EXCISION OF VULVAR CYST   (N/A)  Anesthesia type: MAC  Patient location: PACU  Post pain: Pain level controlled  Post assessment: Post-op Vital signs reviewed  Last Vitals:  Filed Vitals:   08/22/14 1330  BP:   Pulse: 68  Temp:   Resp:     Post vital signs: Reviewed  Level of consciousness: sedated  Complications: No apparent anesthesia complications

## 2014-08-22 NOTE — Anesthesia Postprocedure Evaluation (Signed)
  Anesthesia Post Note  Patient: [name]  Procedure(s) Performed: Procedure(s) (LRB): EXCISION OF VULVAR CYST   (N/A)  Anesthesia type: GA  Patient location: PACU  Post pain: Pain level controlled  Post assessment: Post-op Vital signs reviewed  Last Vitals:  Filed Vitals:   08/22/14 1330  BP:   Pulse: 68  Temp:   Resp:     Post vital signs: Reviewed  Level of consciousness: sedated  Complications: No apparent anesthesia complications

## 2014-08-23 ENCOUNTER — Encounter (HOSPITAL_COMMUNITY): Payer: Self-pay | Admitting: Obstetrics & Gynecology

## 2014-08-30 ENCOUNTER — Encounter: Payer: Self-pay | Admitting: Obstetrics & Gynecology

## 2014-08-30 ENCOUNTER — Ambulatory Visit (INDEPENDENT_AMBULATORY_CARE_PROVIDER_SITE_OTHER): Payer: Medicare Other | Admitting: Obstetrics & Gynecology

## 2014-08-30 VITALS — BP 140/66 | HR 64 | Resp 16 | Ht 67.0 in | Wt 149.0 lb

## 2014-08-30 DIAGNOSIS — Z9889 Other specified postprocedural states: Secondary | ICD-10-CM

## 2014-09-04 NOTE — Progress Notes (Signed)
Post Operative Visit  Procedure: excision of vulvar lesion/sebaceous cyst Days Post-op: 7  Subjective: Pt doing well.  No bleeding or drainage.  No pain.  Never really took anything for pain.  Here for suture removal.  Pathology reviewed.  All questions answered.  Objective: BP 140/66 mmHg  Pulse 64  Resp 16  Ht 5\' 7"  (1.702 m)  Wt 149 lb (67.586 kg)  BMI 23.33 kg/m2  EXAM General: alert and cooperative Resp: clear to auscultation bilaterally Cardio: regular rate and rhythm, S1, S2 normal, no murmur, click, rub or gallop GI: soft, non-tender; bowel sounds normal; no masses,  no organomegaly Extremities: extremities normal, atraumatic, no cyanosis or edema Incision: healing well without erythema.  Sutures removed.  Steri-strips applied.  Assessment: s/p excision of vulvar sebaceous cyst  Plan: F/U as scheduled for AEX.

## 2014-09-07 DIAGNOSIS — Z8639 Personal history of other endocrine, nutritional and metabolic disease: Secondary | ICD-10-CM | POA: Diagnosis not present

## 2014-10-08 ENCOUNTER — Other Ambulatory Visit: Payer: Self-pay | Admitting: Interventional Cardiology

## 2014-10-10 DIAGNOSIS — E538 Deficiency of other specified B group vitamins: Secondary | ICD-10-CM | POA: Diagnosis not present

## 2014-10-17 ENCOUNTER — Other Ambulatory Visit: Payer: Self-pay | Admitting: Interventional Cardiology

## 2014-11-10 DIAGNOSIS — D81818 Other biotin-dependent carboxylase deficiency: Secondary | ICD-10-CM | POA: Diagnosis not present

## 2014-12-07 ENCOUNTER — Ambulatory Visit: Payer: Medicare Other | Admitting: Obstetrics and Gynecology

## 2014-12-12 DIAGNOSIS — D81818 Other biotin-dependent carboxylase deficiency: Secondary | ICD-10-CM | POA: Diagnosis not present

## 2014-12-20 ENCOUNTER — Other Ambulatory Visit: Payer: Self-pay | Admitting: Interventional Cardiology

## 2015-01-09 ENCOUNTER — Other Ambulatory Visit: Payer: Self-pay | Admitting: Gynecologic Oncology

## 2015-01-09 ENCOUNTER — Other Ambulatory Visit: Payer: Self-pay | Admitting: Interventional Cardiology

## 2015-01-16 DIAGNOSIS — D81818 Other biotin-dependent carboxylase deficiency: Secondary | ICD-10-CM | POA: Diagnosis not present

## 2015-01-20 ENCOUNTER — Other Ambulatory Visit: Payer: Self-pay | Admitting: Interventional Cardiology

## 2015-02-07 DIAGNOSIS — Z85828 Personal history of other malignant neoplasm of skin: Secondary | ICD-10-CM | POA: Diagnosis not present

## 2015-02-07 DIAGNOSIS — L237 Allergic contact dermatitis due to plants, except food: Secondary | ICD-10-CM | POA: Diagnosis not present

## 2015-02-17 DIAGNOSIS — D81818 Other biotin-dependent carboxylase deficiency: Secondary | ICD-10-CM | POA: Diagnosis not present

## 2015-03-20 DIAGNOSIS — D81818 Other biotin-dependent carboxylase deficiency: Secondary | ICD-10-CM | POA: Diagnosis not present

## 2015-04-03 DIAGNOSIS — R922 Inconclusive mammogram: Secondary | ICD-10-CM | POA: Diagnosis not present

## 2015-04-03 DIAGNOSIS — Z853 Personal history of malignant neoplasm of breast: Secondary | ICD-10-CM | POA: Diagnosis not present

## 2015-04-07 ENCOUNTER — Other Ambulatory Visit: Payer: Self-pay | Admitting: Interventional Cardiology

## 2015-04-18 ENCOUNTER — Ambulatory Visit (INDEPENDENT_AMBULATORY_CARE_PROVIDER_SITE_OTHER): Payer: Medicare Other | Admitting: Obstetrics & Gynecology

## 2015-04-18 ENCOUNTER — Encounter: Payer: Self-pay | Admitting: Obstetrics & Gynecology

## 2015-04-18 VITALS — BP 130/58 | HR 68 | Resp 16 | Ht 66.25 in | Wt 145.0 lb

## 2015-04-18 DIAGNOSIS — C569 Malignant neoplasm of unspecified ovary: Secondary | ICD-10-CM | POA: Diagnosis not present

## 2015-04-18 DIAGNOSIS — Z01419 Encounter for gynecological examination (general) (routine) without abnormal findings: Secondary | ICD-10-CM | POA: Diagnosis not present

## 2015-04-18 DIAGNOSIS — Z124 Encounter for screening for malignant neoplasm of cervix: Secondary | ICD-10-CM

## 2015-04-18 NOTE — Progress Notes (Signed)
81 y.o. G0P0000 MarriedCaucasianF here for annual exam.  Doing well.  Hasn't seen gyn/onc this year.  Saw Melissa Cross 3/15.  Pt never was called about follow up but feels, now, she doesn't really want to have that follow up unless necessary.   Pt's biggest issues are with increased abdominal gas since her surgery in 2009.  Also, has looser stools since that surgery.  No vaginal bleeding.  Has tried probiotic which didn't help.    PCP:  Dr. Wynetta Emery.  Goes every month for b12 injections.  Has appt next month.  Will do blood work then.   No LMP recorded. Patient has had a hysterectomy.          Sexually active: No.  The current method of family planning is status post hysterectomy.    Exercising: Yes.     Smoker:  no  Health Maintenance: Pap:  06/05/11 WNL History of abnormal Pap:  no MMG:  04/03/15 3D-normal Colonoscopy:  1/09 BMD:   12/15/13-stable TDaP:  ? Dr Bethann Berkshire Screening Labs: PCP, Hb today: PCP, Urine today: PCP   reports that she has never smoked. She has never used smokeless tobacco. She reports that she does not drink alcohol or use illicit drugs.  Past Medical History  Diagnosis Date  . Hypertension   . Hyperlipidemia   . GERD (gastroesophageal reflux disease)   . Heart murmur   . Ureteral calculi     bilateral  . History of breast cancer ONCOLOGIST-- DR Benay Spice    DX 2010--  RIGHT BREAST DCIS  HIGH GRADE S/P LUMPECTOMY W/ SLN BX--  NO RECURRENCE  . History of skin cancer     excision basal cell  . Frequency of urination   . Urgency of urination   . Nocturia   . Dysuria   . Kidney stones 7/14  . CAD (coronary artery disease)   . 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 Ovarian carcinoma     papillary serous-low grade, recurrence found in fat necrosis during  ileostomy  . Breast cancer 2010  . Renal stones 2014  . Shingles     Past Surgical History  Procedure Laterality Date  . Total abdominal hysterectomy w/ bilateral salpingoophorectomy  1976  . Cholecystectomy  1990  . Appendectomy    . Wound debridement  05/28/2012    Procedure: DEBRIDEMENT ABDOMINAL WOUND;  Surgeon: Haywood Lasso, MD;  Location: [place];  Service: General;  Laterality: N/A;  excision chronic wound abdominal wall  . Cystoscopy with stent placement Bilateral 04/07/2013    Procedure: CYSTOSCOPY WITH STENT PLACEMENT;  Surgeon: Alexis Frock, MD;  Location: [facility];  Service: Urology;  Laterality: Bilateral;  . Cystoscopy with biopsy N/A 04/07/2013    Procedure: CYSTOSCOPY WITH BIOPSY and fulgeration;  Surgeon: Alexis Frock, MD;  Location: [facility];  Service: Urology;  Laterality: N/A;  . Excision right breast mass  02-10-2002  . Dx laparoscopy w/ peritoneal and omental bx's and washings  02-16-2008  . Exp. lap. extensive adhesiolysis/ resection terminal ileum , ascending and descending colon with creation ileostomy and mucous fistula  02-19-2008    PERFERATION AND ABD. CANCER--  TAKEDOWN ILEOSTOMY 08-23-2008  . Cardiac catheterization  11-11-2005 DR Daneen Schick    MODERATE LAD DISEASE/ NORMAL LVF/ EF 65-75%  . Breast  lumpectomy Right 04-13-2009    W/ SLN BX  . Cataract extraction w/ intraocular lens implant Left 04-26-2013  . Cystoscopy with retrograde pyelogram, ureteroscopy and stent placement Bilateral 05/07/2013    Procedure: CYSTOSCOPY WITH RETROGRADE PYELOGRAM, URETEROSCOPY ;  Surgeon: Alexis Frock, MD;  Location: [facility];  Service: Urology;  Laterality: Bilateral;  . Cystoscopy w/ ureteral stent placement Bilateral 05/07/2013    Procedure: CYSTOSCOPY WITH STENT REPLACEMENTS;  Surgeon: Alexis Frock, MD;  Location: [facility];  Service: Urology;  Laterality: Bilateral;  . Total abdominal hysterectomy w/ bilateral  salpingoophorectomy  1976  . Vulvar lesion removal N/A 08/22/2014    Procedure: EXCISION OF VULVAR CYST  ;  Surgeon: Lyman Speller, MD;  Location: [facility];  Service: Gynecology;  Laterality: N/A;    Current Outpatient Prescriptions  Medication Sig Dispense Refill  . amLODipine (NORVASC) 5 MG tablet Take 1 tablet (5 mg total) by mouth daily. 90 tablet 1  . aspirin 81 MG tablet Take 81 mg by mouth daily.    . Biotin (BIOTIN MAXIMUM STRENGTH) 10 MG TABS Take 1 tablet by mouth daily.    . Calcium Carbonate-Vitamin D (CALTRATE 600+D PO) Take 1 tablet by mouth 2 (two) times daily.    . carvedilol (COREG) 12.5 MG tablet TAKE 1 TABLET BY MOUTH 2 TIMES DAILY WITH A MEAL. 180 tablet 2  . Cholecalciferol (VITAMIN D) 2000 UNITS tablet Take 2,000 Units by mouth daily.    Marland Kitchen esomeprazole (NEXIUM) 40 MG capsule Take 40 mg by mouth daily before breakfast.    . hydrochlorothiazide (HYDRODIURIL) 12.5 MG tablet TAKE 1 TABLET BY MOUTH DAILY. 90 tablet 0  . lidocaine (XYLOCAINE) 2 % jelly APPLY TO AFFECTED AREA AS NEEDED. 30 mL 0  . Magnesium 400 MG TABS Take 1 tablet by mouth daily.    . Multiple Vitamins-Minerals (MULTIVITAMIN PO) Take 1 tablet by mouth daily.    . Multiple Vitamins-Minerals (PRESERVISION AREDS 2 PO) Take 1 tablet by mouth daily.    Marland Kitchen PROCTOZONE-HC 2.5 % rectal cream PLACE 1 APPLICATION RECTALLY 2 TIMES A DAY AS NEEDED FOR HEMORRHOIDS. 60 g 11  . valsartan (DIOVAN) 320 MG tablet Take 320 mg by mouth every morning.     No current facility-administered medications for this visit.    Family History  Problem Relation Age of Onset  . Breast cancer Sister   . Cancer Sister     breast  . Heart disease Mother   . Hyperlipidemia Mother     ROS:  Pertinent items are noted in HPI.  Otherwise, a comprehensive ROS was negative.  Exam:   General appearance: alert, cooperative and appears stated age Head: Normocephalic, without obvious abnormality, atraumatic Neck: no adenopathy, supple,  symmetrical, trachea midline and thyroid normal to inspection and palpation Lungs: clear to auscultation bilaterally Breasts: normal left breast, right breast with several well healed incision Heart: regular rate and rhythm Abdomen: soft, non-tender; bowel sounds normal; no masses,  no organomegaly Extremities: extremities normal, atraumatic, no cyanosis or edema Skin: Skin color, texture, turgor normal. No rashes or lesions Lymph nodes: Cervical, supraclavicular, and axillary nodes normal. No abnormal inguinal nodes palpated Neurologic: Grossly normal   Pelvic: External genitalia:  no lesions              Urethra:  normal appearing urethra with no masses, tenderness or lesions              Bartholins and Skenes: normal  Vagina: normal appearing vagina with normal color and discharge, no lesions              Cervix: no lesions              Pap taken: No. Bimanual Exam:  Uterus:  uterus absent              Adnexa: no mass, fullness, tenderness               Rectovaginal: Confirms               Anus:  normal sphincter tone, no lesions  Chaperone was present for exam.  A:  Well Woman with normal exam H/o breast ductal ca 6/10 (high grade DCIS with 0.51mm area of invasion), s/p radiation.  On arimidex 7/10-5/15. 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  H/o hysterectomy 1976 Ca-125.  Doesn't plan follow up with gyn/onc and I will follow this going forward. Sees Dr. Wynetta Emery yearly and has b12 monthly injections

## 2015-04-19 LAB — CA 125: CA 125: 18 U/mL (ref <35)

## 2015-04-20 DIAGNOSIS — D81818 Other biotin-dependent carboxylase deficiency: Secondary | ICD-10-CM | POA: Diagnosis not present

## 2015-05-01 DIAGNOSIS — H5203 Hypermetropia, bilateral: Secondary | ICD-10-CM | POA: Diagnosis not present

## 2015-05-01 DIAGNOSIS — H3531 Nonexudative age-related macular degeneration: Secondary | ICD-10-CM | POA: Diagnosis not present

## 2015-05-01 DIAGNOSIS — H52223 Regular astigmatism, bilateral: Secondary | ICD-10-CM | POA: Diagnosis not present

## 2015-05-01 DIAGNOSIS — H524 Presbyopia: Secondary | ICD-10-CM | POA: Diagnosis not present

## 2015-05-22 DIAGNOSIS — D81818 Other biotin-dependent carboxylase deficiency: Secondary | ICD-10-CM | POA: Diagnosis not present

## 2015-06-17 ENCOUNTER — Other Ambulatory Visit: Payer: Self-pay | Admitting: Interventional Cardiology

## 2015-06-21 ENCOUNTER — Other Ambulatory Visit: Payer: Self-pay | Admitting: Interventional Cardiology

## 2015-06-21 DIAGNOSIS — H0015 Chalazion left lower eyelid: Secondary | ICD-10-CM | POA: Diagnosis not present

## 2015-06-22 DIAGNOSIS — C50919 Malignant neoplasm of unspecified site of unspecified female breast: Secondary | ICD-10-CM | POA: Diagnosis not present

## 2015-06-22 DIAGNOSIS — Z23 Encounter for immunization: Secondary | ICD-10-CM | POA: Diagnosis not present

## 2015-06-22 DIAGNOSIS — I10 Essential (primary) hypertension: Secondary | ICD-10-CM | POA: Diagnosis not present

## 2015-06-22 DIAGNOSIS — N200 Calculus of kidney: Secondary | ICD-10-CM | POA: Diagnosis not present

## 2015-06-22 DIAGNOSIS — E559 Vitamin D deficiency, unspecified: Secondary | ICD-10-CM | POA: Diagnosis not present

## 2015-06-22 DIAGNOSIS — Z0000 Encounter for general adult medical examination without abnormal findings: Secondary | ICD-10-CM | POA: Diagnosis not present

## 2015-06-22 DIAGNOSIS — E538 Deficiency of other specified B group vitamins: Secondary | ICD-10-CM | POA: Diagnosis not present

## 2015-06-22 DIAGNOSIS — I2510 Atherosclerotic heart disease of native coronary artery without angina pectoris: Secondary | ICD-10-CM | POA: Diagnosis not present

## 2015-06-22 DIAGNOSIS — E780 Pure hypercholesterolemia: Secondary | ICD-10-CM | POA: Diagnosis not present

## 2015-07-03 DIAGNOSIS — H0015 Chalazion left lower eyelid: Secondary | ICD-10-CM | POA: Diagnosis not present

## 2015-07-10 ENCOUNTER — Encounter: Payer: Self-pay | Admitting: Interventional Cardiology

## 2015-07-10 ENCOUNTER — Ambulatory Visit (INDEPENDENT_AMBULATORY_CARE_PROVIDER_SITE_OTHER): Payer: Medicare Other | Admitting: Interventional Cardiology

## 2015-07-10 VITALS — BP 110/54 | HR 68 | Ht 67.0 in | Wt 139.6 lb

## 2015-07-10 DIAGNOSIS — I10 Essential (primary) hypertension: Secondary | ICD-10-CM | POA: Diagnosis not present

## 2015-07-10 DIAGNOSIS — E785 Hyperlipidemia, unspecified: Secondary | ICD-10-CM | POA: Diagnosis not present

## 2015-07-10 DIAGNOSIS — I25119 Atherosclerotic heart disease of native coronary artery with unspecified angina pectoris: Secondary | ICD-10-CM | POA: Diagnosis not present

## 2015-07-10 NOTE — Patient Instructions (Signed)
Medication Instructions:  Your physician recommends that you continue on your current medications as directed. Please refer to the Current Medication list given to you today.   Labwork: None ordered  Testing/Procedures: None ordered  Follow-Up: Your physician wants you to follow-up in: 1 year with Dr.Smith You will receive a reminder letter in the mail two months in advance. If you don't receive a letter, please call our office to schedule the follow-up appointment.   Any Other Special Instructions Will Be Listed Below (If Applicable).   

## 2015-07-10 NOTE — Progress Notes (Signed)
Cardiology Office Note   Date:  07/10/2015   ID:  [name], DOB [birth year], MRN [id number]  PCP:  Garlan Fair, MD  Cardiologist:  Sinclair Grooms, MD   Chief Complaint  Patient presents with  . Coronary Artery Disease      History of Present Illness: [name] is a 81 y.o. female who presents for  Moderate CAD, hypertension, hyperlipidemia, and  Premature atrial contractions.   The patient denies angina and other CV symptoms. She has not had syncope or tachycardia. She has not needed to use nitroglycerin. There is no orthopnea or PND.    Past Medical History  Diagnosis Date  . Hypertension   . Hyperlipidemia   . GERD (gastroesophageal reflux disease)   . Heart murmur   . Ureteral calculi     bilateral  . History of breast cancer ONCOLOGIST-- DR Benay Spice    DX 2010--  RIGHT BREAST DCIS  HIGH GRADE S/P LUMPECTOMY W/ SLN BX--  NO RECURRENCE  . History of skin cancer     excision basal cell  . Frequency of urination   . Urgency of urination   . Nocturia   . Dysuria   . Kidney stones 7/14  . CAD (coronary artery disease)   . 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 Ovarian carcinoma ([place])     papillary serous-low grade, recurrence found in fat necrosis during ileostomy  . Breast cancer ([place]) 2010  . Renal stones 2014  . Shingles     Past Surgical History  Procedure Laterality Date  . Total abdominal hysterectomy w/ bilateral salpingoophorectomy  1976  . Cholecystectomy  1990  . Appendectomy    . Wound debridement  05/28/2012    Procedure: DEBRIDEMENT ABDOMINAL WOUND;  Surgeon: Haywood Lasso, MD;  Location: [facility];  Service: General;  Laterality: N/A;  excision chronic wound abdominal wall  . Cystoscopy with stent  placement Bilateral 04/07/2013    Procedure: CYSTOSCOPY WITH STENT PLACEMENT;  Surgeon: Alexis Frock, MD;  Location: [facility];  Service: Urology;  Laterality: Bilateral;  . Cystoscopy with biopsy N/A 04/07/2013    Procedure: CYSTOSCOPY WITH BIOPSY and fulgeration;  Surgeon: Alexis Frock, MD;  Location: [facility];  Service: Urology;  Laterality: N/A;  . Excision right breast mass  02-10-2002  . Dx laparoscopy w/ peritoneal and omental bx's and washings  02-16-2008  . Exp. lap. extensive adhesiolysis/ resection terminal ileum , ascending and descending colon with creation ileostomy and mucous fistula  02-19-2008    PERFERATION AND ABD. CANCER--  TAKEDOWN ILEOSTOMY 08-23-2008  . Cardiac catheterization  11-11-2005 DR Daneen Schick    MODERATE LAD DISEASE/ NORMAL LVF/ EF 65-75%  . Breast lumpectomy Right 04-13-2009    W/ SLN BX  . Cataract extraction w/ intraocular lens implant Left 04-26-2013  . Cystoscopy with retrograde pyelogram, ureteroscopy and stent placement Bilateral 05/07/2013    Procedure: CYSTOSCOPY WITH RETROGRADE PYELOGRAM, URETEROSCOPY ;  Surgeon: Alexis Frock, MD;  Location: [facility];  Service: Urology;  Laterality: Bilateral;  . Cystoscopy w/ ureteral stent placement Bilateral 05/07/2013    Procedure: CYSTOSCOPY WITH STENT REPLACEMENTS;  Surgeon: Alexis Frock, MD;  Location: [facility];  Service: Urology;  Laterality: Bilateral;  . Total abdominal hysterectomy w/ bilateral salpingoophorectomy  1976  . Vulvar lesion removal N/A 08/22/2014    Procedure: EXCISION OF VULVAR CYST  ;  Surgeon: Lyman Speller, MD;  Location: [facility];  Service: Gynecology;  Laterality: N/A;     Current Outpatient Prescriptions  Medication Sig Dispense Refill  . amLODipine (NORVASC) 5 MG tablet Take 1 tablet (5 mg total) by mouth daily. 90 tablet 0  . aspirin 81 MG tablet Take 81 mg by mouth daily.    . Biotin (BIOTIN MAXIMUM STRENGTH) 10 MG TABS Take 1 tablet by mouth  daily.    . Calcium Carbonate-Vitamin D (CALTRATE 600+D PO) Take 1 tablet by mouth 2 (two) times daily.    . carvedilol (COREG) 12.5 MG tablet TAKE 1 TABLET BY MOUTH 2 TIMES DAILY WITH A MEAL. 180 tablet 2  . Cholecalciferol (VITAMIN D) 2000 UNITS tablet Take 2,000 Units by mouth daily.    Marland Kitchen esomeprazole (NEXIUM) 40 MG capsule Take 40 mg by mouth daily before breakfast.    . hydrochlorothiazide (HYDRODIURIL) 12.5 MG tablet TAKE 1 TABLET BY MOUTH DAILY. PLEASE SCHEDULE AN APPOINTMENT WITH DR. 30 tablet 0  . lidocaine (XYLOCAINE) 2 % jelly Apply 1 application topically daily as needed (for  pain).    . Magnesium 400 MG TABS Take 1 tablet by mouth daily.    . Multiple Vitamins-Minerals (MULTIVITAMIN PO) Take 1 tablet by mouth daily.    . Multiple Vitamins-Minerals (PRESERVISION AREDS 2 PO) Take 1 tablet by mouth daily.    Marland Kitchen PROCTOZONE-HC 2.5 % rectal cream PLACE 1 APPLICATION RECTALLY 2 TIMES A DAY AS NEEDED FOR HEMORRHOIDS. 60 g 11  . valsartan (DIOVAN) 320 MG tablet Take 320 mg by mouth every morning.     No current facility-administered medications for this visit.    Allergies:   Review of patient's allergies indicates no known allergies.    Social History:  The patient  reports that she has never smoked. She has never used smokeless tobacco. She reports that she does not drink alcohol or use illicit drugs.   Family History:  The patient's family history includes Breast cancer in her sister; Cancer in her sister; Heart disease in her mother; Hyperlipidemia in her mother.    ROS:  Please see the history of present illness.   Otherwise, review of systems are positive for  none.   All other systems are reviewed and negative.    PHYSICAL EXAM: VS:  BP 110/54 mmHg  Pulse 68  Ht 5\' 7"  (1.702 m)  Wt 63.322 kg (139 lb 9.6 oz)  BMI 21.86 kg/m2 , BMI Body mass index is 21.86 kg/(m^2). GEN: Well nourished, well developed, in no acute distress HEENT: normal Neck: no JVD, carotid bruits, or  masses Cardiac:  RRR.  There  is no murmur, rub, or gallop. There is  no edema. Respiratory:  clear to auscultation bilaterally, normal work of breathing. GI: soft, nontender, nondistended, + BS MS: no deformity or atrophy Skin: warm and dry, no rash Neuro:  Strength and sensation are intact Psych: euthymic mood, full affect   EKG:  EKG  is ordered today. The ekg reveals  Normal sinus rhythm, biatrial abnormality, premature atrial contractions with a bigeminal pattern.   Recent Labs: 08/16/2014: BUN 29*; Creatinine, Ser 1.18*; Hemoglobin 12.0; Platelets 281; Potassium 3.5*; Sodium 140    Lipid Panel    Component Value Date/Time   CHOL  02/29/2008 0515    61        ATP III CLASSIFICATION:  <200  mg/dL   Desirable  200-239  mg/dL   Borderline High  >=240    mg/dL   High   TRIG 110 02/29/2008 0515      Wt Readings from Last 3 Encounters:  07/10/15 63.322 kg (139 lb 9.6 oz)  04/18/15 65.772 kg (145 lb)  08/30/14 67.586 kg (149 lb)      Other studies Reviewed: Additional studies/ records that were reviewed today include:  none..    ASSESSMENT AND PLAN:  1. Coronary artery disease involving native coronary artery of native heart with angina pectoris ([place])  asymptomatic. She has known non-obstructive disease with 50% LAD and diagonal.  2. Hyperlipidemia  followed by primary  3. Essential hypertension  excellent control    Current medicines are reviewed at length with the patient today.  The patient has the following concerns regarding medicines:  none.  The following changes/actions have been instituted:     aerobic activity   Call if symptoms  Labs/ tests ordered today include:  No orders of the defined types were placed in this encounter.     Disposition:   FU with HS in 1 year  Signed, Sinclair Grooms, MD  07/10/2015 9:45 AM    [address], [place], [place]  [address] Phone: [phone]; Fax: [phone]

## 2015-07-24 DIAGNOSIS — E538 Deficiency of other specified B group vitamins: Secondary | ICD-10-CM | POA: Diagnosis not present

## 2015-08-22 DIAGNOSIS — H318 Other specified disorders of choroid: Secondary | ICD-10-CM | POA: Diagnosis not present

## 2015-08-22 DIAGNOSIS — H43813 Vitreous degeneration, bilateral: Secondary | ICD-10-CM | POA: Diagnosis not present

## 2015-08-24 DIAGNOSIS — E538 Deficiency of other specified B group vitamins: Secondary | ICD-10-CM | POA: Diagnosis not present

## 2015-09-15 ENCOUNTER — Encounter: Payer: Self-pay | Admitting: Certified Nurse Midwife

## 2015-09-15 ENCOUNTER — Other Ambulatory Visit: Payer: Self-pay | Admitting: Interventional Cardiology

## 2015-09-15 ENCOUNTER — Ambulatory Visit (INDEPENDENT_AMBULATORY_CARE_PROVIDER_SITE_OTHER): Payer: Medicare Other | Admitting: Certified Nurse Midwife

## 2015-09-15 VITALS — BP 120/72 | HR 70 | Temp 97.3°F | Resp 16 | Ht 66.25 in | Wt 140.0 lb

## 2015-09-15 DIAGNOSIS — N390 Urinary tract infection, site not specified: Secondary | ICD-10-CM

## 2015-09-15 DIAGNOSIS — R319 Hematuria, unspecified: Secondary | ICD-10-CM

## 2015-09-15 DIAGNOSIS — I25119 Atherosclerotic heart disease of native coronary artery with unspecified angina pectoris: Secondary | ICD-10-CM | POA: Diagnosis not present

## 2015-09-15 DIAGNOSIS — N952 Postmenopausal atrophic vaginitis: Secondary | ICD-10-CM

## 2015-09-15 LAB — POCT URINALYSIS DIPSTICK
BILIRUBIN UA: NEGATIVE
GLUCOSE UA: NEGATIVE
KETONES UA: NEGATIVE
Leukocytes, UA: NEGATIVE
Nitrite, UA: NEGATIVE
Protein, UA: NEGATIVE
Urobilinogen, UA: NEGATIVE
pH, UA: 5

## 2015-09-15 MED ORDER — NITROFURANTOIN MONOHYD MACRO 100 MG PO CAPS
100.0000 mg | ORAL_CAPSULE | Freq: Two times a day (BID) | ORAL | Status: DC
Start: 2015-09-15 — End: 2016-06-27

## 2015-09-15 NOTE — Progress Notes (Signed)
81 y.o.Married white female g0p0 here with complaint of UTI, with onset  on one month. Patient complaining of urinary frequency/urgency/ and pain with urination. Patient denies fever, chills, nausea or back pain. No new personal products. Patient feels not related to sexual activity spouse has prostate issues. Denies any vaginal symptoms.. Menopausal with vaginal dryness. Patient is consuming adequate water intake. Patient thought this would go away but continued. No other health issues today.   O: Healthy female WDWN Affect: Normal, orientation x 3 Skin : warm and dry CVAT: negative bilateral Abdomen: positive for suprapubic tenderness  Pelvic exam: External genital area:atrophic and slight increase pink, no scaling, no lesions Bladder,Urethra tender, Urethral meatus: tender, red Vagina:atrophic appearance, scant moisture Cervix: absent Uterus: absent Adnexa:, no fullness or masses, adnexa absent   A: UTI Normal pelvic exam poct urine-rbc 1+ Atrophic vaginitis  P: Reviewed findings of UTI and need for treatment. [id number] see order [id number] micro, culture Reviewed warning signs and symptoms of UTI and need to advise if occurring. Start on Cranberry capsule one daily also. Encouraged to limit soda, tea, and coffee and increase water. Discussed vaginal atrophy/dryness and etiology. Discussed OTC trial of coconut oil to help with dryness. Discussed increase risk of UTI with dryness. Patient feels she can do this without problems.  Rv prn   RV prn

## 2015-09-15 NOTE — Patient Instructions (Addendum)
Urinary Tract Infection Urinary tract infections (UTIs) can develop anywhere along your urinary tract. Your urinary tract is your body's drainage system for removing wastes and extra water. Your urinary tract includes two kidneys, two ureters, a bladder, and a urethra. Your kidneys are a pair of bean-shaped organs. Each kidney is about the size of your fist. They are located below your ribs, one on each side of your spine. CAUSES Infections are caused by microbes, which are microscopic organisms, including fungi, viruses, and bacteria. These organisms are so small that they can only be seen through a microscope. Bacteria are the microbes that most commonly cause UTIs. SYMPTOMS  Symptoms of UTIs may vary by age and gender of the patient and by the location of the infection. Symptoms in young women typically include a frequent and intense urge to urinate and a painful, burning feeling in the bladder or urethra during urination. Older women and men are more likely to be tired, shaky, and weak and have muscle aches and abdominal pain. A fever may mean the infection is in your kidneys. Other symptoms of a kidney infection include pain in your back or sides below the ribs, nausea, and vomiting. DIAGNOSIS To diagnose a UTI, your caregiver will ask you about your symptoms. Your caregiver will also ask you to provide a urine sample. The urine sample will be tested for bacteria and white blood cells. White blood cells are made by your body to help fight infection. TREATMENT  Typically, UTIs can be treated with medication. Because most UTIs are caused by a bacterial infection, they usually can be treated with the use of antibiotics. The choice of antibiotic and length of treatment depend on your symptoms and the type of bacteria causing your infection. HOME CARE INSTRUCTIONS  If you were prescribed antibiotics, take them exactly as your caregiver instructs you. Finish the medication even if you feel better after  you have only taken some of the medication.  Drink enough water and fluids to keep your urine clear or pale yellow.  Avoid caffeine, tea, and carbonated beverages. They tend to irritate your bladder.  Empty your bladder often. Avoid holding urine for long periods of time.  Empty your bladder before and after sexual intercourse.  After a bowel movement, women should cleanse from front to back. Use each tissue only once. SEEK MEDICAL CARE IF:   You have back pain.  You develop a fever.  Your symptoms do not begin to resolve within 3 days. SEEK IMMEDIATE MEDICAL CARE IF:   You have severe back pain or lower abdominal pain.  You develop chills.  You have nausea or vomiting.  You have continued burning or discomfort with urination. MAKE SURE YOU:   Understand these instructions.  Will watch your condition.  Will get help right away if you are not doing well or get worse.   This information is not intended to replace advice given to you by your health care provider. Make sure you discuss any questions you have with your health care provider.   Document Released: 07/03/2005 Document Revised: 06/14/2015 Document Reviewed: 11/01/2011 Elsevier Interactive Patient Education 2016 [place]

## 2015-09-16 LAB — URINALYSIS, MICROSCOPIC ONLY
BACTERIA UA: NONE SEEN [HPF]
Casts: NONE SEEN [LPF]
SQUAMOUS EPITHELIAL / LPF: NONE SEEN [HPF] (ref <=5)
WBC UA: NONE SEEN WBC/HPF (ref <=5)
Yeast: NONE SEEN [HPF]

## 2015-09-17 LAB — URINE CULTURE

## 2015-09-20 NOTE — Progress Notes (Signed)
Reviewed personally.  M. Suzanne Vallen Calabrese, MD.  

## 2015-09-27 DIAGNOSIS — E538 Deficiency of other specified B group vitamins: Secondary | ICD-10-CM | POA: Diagnosis not present

## 2015-10-11 DIAGNOSIS — I10 Essential (primary) hypertension: Secondary | ICD-10-CM | POA: Diagnosis not present

## 2015-10-30 DIAGNOSIS — E538 Deficiency of other specified B group vitamins: Secondary | ICD-10-CM | POA: Diagnosis not present

## 2015-12-04 DIAGNOSIS — E538 Deficiency of other specified B group vitamins: Secondary | ICD-10-CM | POA: Diagnosis not present

## 2016-01-08 DIAGNOSIS — E538 Deficiency of other specified B group vitamins: Secondary | ICD-10-CM | POA: Diagnosis not present

## 2016-02-09 DIAGNOSIS — E538 Deficiency of other specified B group vitamins: Secondary | ICD-10-CM | POA: Diagnosis not present

## 2016-02-28 DIAGNOSIS — H00015 Hordeolum externum left lower eyelid: Secondary | ICD-10-CM | POA: Diagnosis not present

## 2016-03-01 DIAGNOSIS — L308 Other specified dermatitis: Secondary | ICD-10-CM | POA: Diagnosis not present

## 2016-03-01 DIAGNOSIS — L820 Inflamed seborrheic keratosis: Secondary | ICD-10-CM | POA: Diagnosis not present

## 2016-03-01 DIAGNOSIS — Z85828 Personal history of other malignant neoplasm of skin: Secondary | ICD-10-CM | POA: Diagnosis not present

## 2016-03-06 DIAGNOSIS — H00015 Hordeolum externum left lower eyelid: Secondary | ICD-10-CM | POA: Diagnosis not present

## 2016-03-11 DIAGNOSIS — E538 Deficiency of other specified B group vitamins: Secondary | ICD-10-CM | POA: Diagnosis not present

## 2016-04-04 DIAGNOSIS — Z853 Personal history of malignant neoplasm of breast: Secondary | ICD-10-CM | POA: Diagnosis not present

## 2016-04-04 DIAGNOSIS — M8589 Other specified disorders of bone density and structure, multiple sites: Secondary | ICD-10-CM | POA: Diagnosis not present

## 2016-04-04 DIAGNOSIS — R922 Inconclusive mammogram: Secondary | ICD-10-CM | POA: Diagnosis not present

## 2016-04-11 DIAGNOSIS — E538 Deficiency of other specified B group vitamins: Secondary | ICD-10-CM | POA: Diagnosis not present

## 2016-04-12 DIAGNOSIS — Z85828 Personal history of other malignant neoplasm of skin: Secondary | ICD-10-CM | POA: Diagnosis not present

## 2016-04-12 DIAGNOSIS — R208 Other disturbances of skin sensation: Secondary | ICD-10-CM | POA: Diagnosis not present

## 2016-04-15 ENCOUNTER — Encounter: Payer: Self-pay | Admitting: Obstetrics & Gynecology

## 2016-05-01 DIAGNOSIS — H5203 Hypermetropia, bilateral: Secondary | ICD-10-CM | POA: Diagnosis not present

## 2016-05-01 DIAGNOSIS — H52223 Regular astigmatism, bilateral: Secondary | ICD-10-CM | POA: Diagnosis not present

## 2016-05-01 DIAGNOSIS — H26493 Other secondary cataract, bilateral: Secondary | ICD-10-CM | POA: Diagnosis not present

## 2016-05-01 DIAGNOSIS — H524 Presbyopia: Secondary | ICD-10-CM | POA: Diagnosis not present

## 2016-05-08 DIAGNOSIS — R197 Diarrhea, unspecified: Secondary | ICD-10-CM | POA: Diagnosis not present

## 2016-05-13 DIAGNOSIS — E538 Deficiency of other specified B group vitamins: Secondary | ICD-10-CM | POA: Diagnosis not present

## 2016-06-11 ENCOUNTER — Other Ambulatory Visit: Payer: Self-pay | Admitting: Interventional Cardiology

## 2016-06-17 DIAGNOSIS — Z23 Encounter for immunization: Secondary | ICD-10-CM | POA: Diagnosis not present

## 2016-06-17 DIAGNOSIS — E538 Deficiency of other specified B group vitamins: Secondary | ICD-10-CM | POA: Diagnosis not present

## 2016-06-26 ENCOUNTER — Telehealth: Payer: Self-pay | Admitting: Interventional Cardiology

## 2016-06-26 DIAGNOSIS — R197 Diarrhea, unspecified: Secondary | ICD-10-CM | POA: Diagnosis not present

## 2016-06-26 DIAGNOSIS — I10 Essential (primary) hypertension: Secondary | ICD-10-CM | POA: Diagnosis not present

## 2016-06-26 DIAGNOSIS — Z1389 Encounter for screening for other disorder: Secondary | ICD-10-CM | POA: Diagnosis not present

## 2016-06-26 DIAGNOSIS — Z0000 Encounter for general adult medical examination without abnormal findings: Secondary | ICD-10-CM | POA: Diagnosis not present

## 2016-06-26 NOTE — Telephone Encounter (Addendum)
Jennifer from Dr. Hassell Done Johnson's (GI) office calling stating Ms. Keville was seen in their office today.  Her BP was 201/77; 206/75; 208/77.  Dr. Wynetta Emery wanted her to be seen tomorrow by PA or Dr. Tamala Julian.  States pt feels fine; no symptoms.  Made her an appointment to see Lyda Jester, PA for 11:00.  Left VM for pt to call back to confirm.  Time is 4:55.  Left message for her to call in AM if could not keep appointment.  5:20 Left another message

## 2016-06-26 NOTE — Telephone Encounter (Signed)
Pt c/o BP issue: STAT if pt c/o blurred vision, one-sided weakness or slurred speech  1. What are your last 5 BP readings? 201/77 206/75 208/77  Hr 52  2. Are you having any other symptoms (ex. Dizziness, headache, blurred vision, passed out)? no  3. What is your BP issue? No  No chest pain/ no sob

## 2016-06-27 ENCOUNTER — Encounter: Payer: Self-pay | Admitting: Interventional Cardiology

## 2016-06-27 ENCOUNTER — Ambulatory Visit (INDEPENDENT_AMBULATORY_CARE_PROVIDER_SITE_OTHER): Payer: Medicare Other | Admitting: Interventional Cardiology

## 2016-06-27 VITALS — BP 170/72 | HR 68 | Ht 66.0 in | Wt 131.8 lb

## 2016-06-27 DIAGNOSIS — I10 Essential (primary) hypertension: Secondary | ICD-10-CM

## 2016-06-27 DIAGNOSIS — I2510 Atherosclerotic heart disease of native coronary artery without angina pectoris: Secondary | ICD-10-CM | POA: Diagnosis not present

## 2016-06-27 DIAGNOSIS — E785 Hyperlipidemia, unspecified: Secondary | ICD-10-CM

## 2016-06-27 MED ORDER — SPIRONOLACTONE 25 MG PO TABS: 12.5000 mg | ORAL_TABLET | Freq: Every day | ORAL | 11 refills | Status: DC

## 2016-06-27 MED ORDER — AMLODIPINE BESYLATE 10 MG PO TABS: 10.0000 mg | ORAL_TABLET | Freq: Every day | ORAL | 11 refills | Status: DC

## 2016-06-27 NOTE — Progress Notes (Signed)
Cardiology Office Note    Date:  06/27/2016   ID:  [name], [name] 1934-03-20, MRN [id number]  PCP:  Garlan Fair, MD  Cardiologist: Sinclair Grooms, MD   Chief Complaint  Patient presents with  . Coronary Artery Disease  . Follow-up    Hypertension    History of Present Illness:  [name] is a 82 y.o. female Moderate CAD, hypertension, hyperlipidemia, and Premature atrial contractions.  Seen for increased BP. Was seen at the primary care office 2 days ago and there was significant Systolic blood pressure elevation greater than 210 mmHg which caused concern. No chest pain. On Aleve.    Past Medical History:  Diagnosis Date  . 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 Breast cancer ([place]) 2010  . CAD (coronary artery disease)   . Dysuria   . Frequency of urination   . GERD (gastroesophageal reflux disease)   . Heart murmur   . History of breast cancer ONCOLOGIST-- DR Benay Spice   DX 2010--  RIGHT BREAST DCIS  HIGH GRADE S/P LUMPECTOMY W/ SLN BX--  NO RECURRENCE  . 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 History of skin cancer    excision basal cell  . Hyperlipidemia   . Hypertension   . Kidney stones 7/14  . Nocturia   . 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 Renal stones 2014  . Shingles   . Ureteral calculi    bilateral  . Urgency of urination     Past Surgical History:  Procedure Laterality Date  . APPENDECTOMY    . BREAST LUMPECTOMY Right 04-13-2009   W/ SLN BX  . CARDIAC CATHETERIZATION  11-11-2005 DR Daneen Schick   MODERATE LAD DISEASE/ NORMAL LVF/ EF 65-75%  . CATARACT EXTRACTION W/ INTRAOCULAR LENS IMPLANT Left 04-26-2013  . CHOLECYSTECTOMY  1990  . CYSTOSCOPY W/ URETERAL STENT PLACEMENT Bilateral 05/07/2013   Procedure: CYSTOSCOPY WITH STENT REPLACEMENTS;  Surgeon: Alexis Frock, MD;  Location: [facility];  Service: Urology;  Laterality: Bilateral;  . CYSTOSCOPY WITH BIOPSY N/A 04/07/2013   Procedure: CYSTOSCOPY WITH BIOPSY and fulgeration;  Surgeon: Alexis Frock, MD;  Location: [facility];  Service: Urology;  Laterality: N/A;  . CYSTOSCOPY WITH RETROGRADE PYELOGRAM, URETEROSCOPY AND STENT PLACEMENT Bilateral 05/07/2013   Procedure: CYSTOSCOPY WITH RETROGRADE PYELOGRAM, URETEROSCOPY ;  Surgeon: Alexis Frock, MD;  Location[facility];  Service: Urology;  Laterality: Bilateral;  . CYSTOSCOPY WITH STENT PLACEMENT Bilateral 04/07/2013   Procedure: CYSTOSCOPY WITH STENT PLACEMENT;  Surgeon: Alexis Frock, MD;  Location: [facility];  Service: Urology;  Laterality: Bilateral;  . DX LAPAROSCOPY W/ PERITONEAL AND OMENTAL BX'S AND WASHINGS  02-16-2008  . EXCISION RIGHT BREAST MASS  02-10-2002  . EXP. LAP. EXTENSIVE ADHESIOLYSIS/ RESECTION TERMINAL ILEUM , ASCENDING AND DESCENDING COLON WITH CREATION ILEOSTOMY AND MUCOUS FISTULA  02-19-2008   PERFERATION AND ABD. CANCER--  TAKEDOWN ILEOSTOMY 08-23-2008  . TOTAL ABDOMINAL HYSTERECTOMY W/ BILATERAL SALPINGOOPHORECTOMY  1976  . TOTAL ABDOMINAL HYSTERECTOMY W/ BILATERAL SALPINGOOPHORECTOMY  1976  . VULVAR LESION REMOVAL N/A 08/22/2014   Procedure: EXCISION OF VULVAR CYST  ;  Surgeon: Lyman Speller, MD;  Location: [facility];  Service: Gynecology;  Laterality: N/A;  . WOUND DEBRIDEMENT  05/28/2012   Procedure: DEBRIDEMENT ABDOMINAL  WOUND;  Surgeon: Haywood Lasso, MD;  Location: [place];  Service: General;  Laterality: N/A;  excision chronic wound abdominal wall    Current Medications: Outpatient Medications Prior to Visit  Medication Sig Dispense Refill  . aspirin 81 MG tablet Take 81 mg by mouth daily.    . Biotin (BIOTIN MAXIMUM STRENGTH) 10 MG TABS Take 1 tablet by mouth daily.    . Calcium Carbonate-Vitamin D  (CALTRATE 600+D PO) Take 1 tablet by mouth 2 (two) times daily.    . carvedilol (COREG) 12.5 MG tablet TAKE 1 TABLET BY MOUTH 2 TIMES DAILY WITH A MEAL. 180 tablet 2  . Cholecalciferol (VITAMIN D) 2000 UNITS tablet Take 2,000 Units by mouth daily.    Marland Kitchen lidocaine (XYLOCAINE) 2 % jelly Apply 1 application topically daily as needed (for  pain).    . Multiple Vitamins-Minerals (MULTIVITAMIN PO) Take 1 tablet by mouth daily.    Marland Kitchen PROCTOZONE-HC 2.5 % rectal cream PLACE 1 APPLICATION RECTALLY 2 TIMES A DAY AS NEEDED FOR HEMORRHOIDS. 60 g 11  . valsartan (DIOVAN) 320 MG tablet Take 320 mg by mouth every morning.    Marland Kitchen amLODipine (NORVASC) 5 MG tablet TAKE 1 TABLET DAILY 90 tablet 0  . hydrochlorothiazide (HYDRODIURIL) 12.5 MG tablet TAKE 1 TABLET BY MOUTH DAILY. PLEASE SCHEDULE AN APPOINTMENT WITH DR. 30 tablet 0  . esomeprazole (NEXIUM) 40 MG capsule Take 40 mg by mouth daily before breakfast.    . Magnesium 400 MG TABS Take 1 tablet by mouth daily.    . nitrofurantoin, macrocrystal-monohydrate, (MACROBID) 100 MG capsule Take 1 capsule (100 mg total) by mouth 2 (two) times daily. (Patient not taking: Reported on 06/27/2016) 14 capsule 0   No facility-administered medications prior to visit.      Allergies:   Review of patient's allergies indicates no known allergies.   Social History   Social History  . Marital status: Married    Spouse name: N/A  . Number of children: N/A  . Years of education: N/A   Social History Main Topics  . Smoking status: Never Smoker  . Smokeless tobacco: Never Used  . Alcohol use No  . Drug use: No  . Sexual activity: No     Comment: Hysterectomy   Other Topics Concern  . None   Social History Narrative  . None     Family History:  The patient's family history includes Breast cancer in her sister; Cancer in her sister; Heart disease in her mother; Hyperlipidemia in her mother.   ROS:   Please see the history of present illness.    Cough, and headaches.  Otherwise unremarkable.  All other systems reviewed and are negative.   PHYSICAL EXAM:   VS:  BP (!) 170/72   Pulse 68   Ht 5\' 6"  (1.676 m)   Wt 131 lb 12.8 oz (59.8 kg)   BMI 21.27 kg/m    GEN: Well nourished, well developed, in no acute distress  HEENT: normal  Neck: no JVD, carotid bruits, or masses Cardiac: RRR; 2/6 systolic murmur, rubs, or gallops,no edema  Respiratory:  clear to auscultation bilaterally, normal work of breathing GI: soft, nontender, nondistended, + BS MS: no deformity or atrophy  Skin: warm and dry, no rash Neuro:  Alert and Oriented x 3, Strength and sensation are intact Psych: euthymic mood, full affect  Wt Readings from Last 3 Encounters:  06/27/16 131 lb 12.8 oz (59.8 kg)  09/15/15 140 lb (63.5 kg)  07/10/15 139 lb 9.6 oz (63.3 kg)      Studies/Labs Reviewed:   EKG:  EKG  Normal sinus rhythm, PACs, nonspecific ST abnormality. QS pattern V1 and V2.  Recent Labs: No results found for requested labs within last 8760 hours.   Lipid Panel    Component Value Date/Time   CHOL  02/29/2008 0515    61        ATP III CLASSIFICATION:  <200     mg/dL   Desirable  200-239  mg/dL   Borderline High  >=240    mg/dL   High   TRIG 110 02/29/2008 0515    Additional studies/ records that were reviewed today include:  No new data is available    ASSESSMENT:    1. Essential hypertension   2. Coronary artery disease involving native coronary artery of native heart without angina pectoris   3. Hyperlipidemia      PLAN:  In order of problems listed above:  1. Bilateral renal duplex to rule out renal artery stenosis. Increase amlodipine to 10 mg per day. Discontinue HCTZ and start Aldactone 12.5 mg per day. Discontinue Aleve. 2 g sodium diet. Follow-up in 2-4 weeks. Basic metabolic panel in one week. 2. Asymptomatic 3. Low-fat diet.    Medication Adjustments/Labs and Tests Ordered: Current medicines are reviewed at length with the patient today.   Concerns regarding medicines are outlined above.  Medication changes, Labs and Tests ordered today are listed in the Patient Instructions below. Patient Instructions  Medication Instructions:  Your physician has recommended you make the following change in your medication:   1) STOP Hydrochlorothiazide 2) START Aldactone 12.5 mg (1/2 tablet) daily 3) INCREASE Amlodipine to 10 mg daily   Labwork: BMET in 1 week  Testing/Procedures: Your physician has requested that you have a renal artery duplex. During this test, an ultrasound is used to evaluate blood flow to the kidneys. Allow one hour for this exam. Do not eat after midnight the day before and avoid carbonated beverages. Take your medications as you usually do.    Follow-Up: Follow-up as planned with Dr. Tamala Julian on 07/22/16.    Any Other Special Instructions Will Be Listed Below (If Applicable). Low-Sodium Eating Plan  Sodium raises blood pressure and causes water to be held in the body. Getting less sodium from food will help lower your blood pressure, reduce any swelling, and protect your heart, liver, and kidneys. We get sodium by adding salt (sodium chloride) to food. Most of our sodium comes from canned, boxed, and frozen foods. Restaurant foods, fast foods, and pizza are also very high in sodium. Even if you take medicine to lower your blood pressure or to reduce fluid in your body, getting less sodium from your food is important. WHAT IS MY PLAN? Most people should limit their sodium intake to 2,300 mg a day. Your health care provider recommends that you limit your sodium intake to 2 grams_ a day.  WHAT DO I NEED TO KNOW ABOUT THIS EATING PLAN? For the low-sodium eating plan, you will follow these general guidelines:  Choose foods with a % Daily Value for sodium of less than 5% (as listed on the food label).   Use salt-free seasonings or herbs instead of table salt or sea salt.   Check with your health care provider or  pharmacist before using salt substitutes.   Eat fresh foods.  Eat more vegetables and fruits.  Limit canned vegetables. If you do use them, rinse them  well to decrease the sodium.   Limit cheese to 1 oz (28 g) per day.   Eat lower-sodium products, often labeled as "lower sodium" or "no salt added."  Avoid foods that contain monosodium glutamate (MSG). MSG is sometimes added to [place] food and some canned foods.  Check food labels (Nutrition Facts labels) on foods to learn how much sodium is in one serving.  Eat more home-cooked food and less restaurant, buffet, and fast food.  When eating at a restaurant, ask that your food be prepared with less salt, or no salt if possible.  HOW DO I READ FOOD LABELS FOR SODIUM INFORMATION? The Nutrition Facts label lists the amount of sodium in one serving of the food. If you eat more than one serving, you must multiply the listed amount of sodium by the number of servings. Food labels may also identify foods as:  Sodium free--Less than 5 mg in a serving.  Very low sodium--35 mg or less in a serving.  Low sodium--140 mg or less in a serving.  Light in sodium--50% less sodium in a serving. For example, if a food that usually has 300 mg of sodium is changed to become light in sodium, it will have 150 mg of sodium.  Reduced sodium--25% less sodium in a serving. For example, if a food that usually has 400 mg of sodium is changed to reduced sodium, it will have 300 mg of sodium. WHAT FOODS CAN I EAT? Grains Low-sodium cereals, including oats, puffed wheat and rice, and shredded wheat cereals. Low-sodium crackers. Unsalted rice and pasta. Lower-sodium bread.  Vegetables Frozen or fresh vegetables. Low-sodium or reduced-sodium canned vegetables. Low-sodium or reduced-sodium tomato sauce and paste. Low-sodium or reduced-sodium tomato and vegetable juices.  Fruits Fresh, frozen, and canned fruit. Fruit juice.  Meat and Other Protein  Products Low-sodium canned tuna and salmon. Fresh or frozen meat, poultry, seafood, and fish. Lamb. Unsalted nuts. Dried beans, peas, and lentils without added salt. Unsalted canned beans. Homemade soups without salt. Eggs.  Dairy Milk. Soy milk. Ricotta cheese. Low-sodium or reduced-sodium cheeses. Yogurt.  Condiments Fresh and dried herbs and spices. Salt-free seasonings. Onion and garlic powders. Low-sodium varieties of mustard and ketchup. Fresh or refrigerated horseradish. Lemon juice.  Fats and Oils Reduced-sodium salad dressings. Unsalted butter.  Other Unsalted popcorn and pretzels.  The items listed above may not be a complete list of recommended foods or beverages. Contact your dietitian for more options. WHAT FOODS ARE NOT RECOMMENDED? Grains Instant hot cereals. Bread stuffing, pancake, and biscuit mixes. Croutons. Seasoned rice or pasta mixes. Noodle soup cups. Boxed or frozen macaroni and cheese. Self-rising flour. Regular salted crackers. Vegetables Regular canned vegetables. Regular canned tomato sauce and paste. Regular tomato and vegetable juices. Frozen vegetables in sauces. Salted [place] fries. Olives. Angie Fava. Relishes. Sauerkraut. Salsa. Meat and Other Protein Products Salted, canned, smoked, spiced, or pickled meats, seafood, or fish. Bacon, ham, sausage, hot dogs, corned beef, chipped beef, and packaged luncheon meats. Salt pork. Jerky. Pickled herring. Anchovies, regular canned tuna, and sardines. Salted nuts. Dairy Processed cheese and cheese spreads. Cheese curds. Blue cheese and cottage cheese. Buttermilk.  Condiments Onion and garlic salt, seasoned salt, table salt, and sea salt. Canned and packaged gravies. Worcestershire sauce. Tartar sauce. Barbecue sauce. Teriyaki sauce. Soy sauce, including reduced sodium. Steak sauce. Fish sauce. Oyster sauce. Cocktail sauce. Horseradish that you find on the shelf. Regular ketchup and mustard. Meat flavorings and  tenderizers. Bouillon cubes. Hot sauce. Tabasco sauce. Marinades. Taco seasonings.  Relishes. Fats and Oils Regular salad dressings. Salted butter. Margarine. Ghee. Bacon fat.  Other Potato and tortilla chips. Corn chips and puffs. Salted popcorn and pretzels. Canned or dried soups. Pizza. Frozen entrees and pot pies.  The items listed above may not be a complete list of foods and beverages to avoid. Contact your dietitian for more information.  This information is not intended to replace advice given to you by your health care provider. Make sure you discuss any questions you have with your health care provider.  Document Released: 03/15/2002 Document Revised: 10/14/2014 Document Reviewed: 07/28/2013 Elsevier Interactive Patient Education [employer].     If you need a refill on your cardiac medications before your next appointment, please call your pharmacy.      Signed, Sinclair Grooms, MD  06/27/2016 12:14 PM    [facility] [address], [place], [place]  [address] Phone: [phone]; Fax: [phone]

## 2016-06-27 NOTE — Patient Instructions (Addendum)
Medication Instructions:  Your physician has recommended you make the following change in your medication:   1) STOP Hydrochlorothiazide 2) START Aldactone 12.5 mg (1/2 tablet) daily 3) INCREASE Amlodipine to 10 mg daily   Labwork: BMET in 1 week  Testing/Procedures: Your physician has requested that you have a renal artery duplex. During this test, an ultrasound is used to evaluate blood flow to the kidneys. Allow one hour for this exam. Do not eat after midnight the day before and avoid carbonated beverages. Take your medications as you usually do.    Follow-Up: Follow-up as planned with Dr. Tamala Julian on 07/22/16.    Any Other Special Instructions Will Be Listed Below (If Applicable). Low-Sodium Eating Plan  Sodium raises blood pressure and causes water to be held in the body. Getting less sodium from food will help lower your blood pressure, reduce any swelling, and protect your heart, liver, and kidneys. We get sodium by adding salt (sodium chloride) to food. Most of our sodium comes from canned, boxed, and frozen foods. Restaurant foods, fast foods, and pizza are also very high in sodium. Even if you take medicine to lower your blood pressure or to reduce fluid in your body, getting less sodium from your food is important. WHAT IS MY PLAN? Most people should limit their sodium intake to 2,300 mg a day. Your health care provider recommends that you limit your sodium intake to 2 grams_ a day.  WHAT DO I NEED TO KNOW ABOUT THIS EATING PLAN? For the low-sodium eating plan, you will follow these general guidelines:  Choose foods with a % Daily Value for sodium of less than 5% (as listed on the food label).   Use salt-free seasonings or herbs instead of table salt or sea salt.   Check with your health care provider or pharmacist before using salt substitutes.   Eat fresh foods.  Eat more vegetables and fruits.  Limit canned vegetables. If you do use them, rinse them well to  decrease the sodium.   Limit cheese to 1 oz (28 g) per day.   Eat lower-sodium products, often labeled as "lower sodium" or "no salt added."  Avoid foods that contain monosodium glutamate (MSG). MSG is sometimes added to [place] food and some canned foods.  Check food labels (Nutrition Facts labels) on foods to learn how much sodium is in one serving.  Eat more home-cooked food and less restaurant, buffet, and fast food.  When eating at a restaurant, ask that your food be prepared with less salt, or no salt if possible.  HOW DO I READ FOOD LABELS FOR SODIUM INFORMATION? The Nutrition Facts label lists the amount of sodium in one serving of the food. If you eat more than one serving, you must multiply the listed amount of sodium by the number of servings. Food labels may also identify foods as:  Sodium free--Less than 5 mg in a serving.  Very low sodium--35 mg or less in a serving.  Low sodium--140 mg or less in a serving.  Light in sodium--50% less sodium in a serving. For example, if a food that usually has 300 mg of sodium is changed to become light in sodium, it will have 150 mg of sodium.  Reduced sodium--25% less sodium in a serving. For example, if a food that usually has 400 mg of sodium is changed to reduced sodium, it will have 300 mg of sodium. WHAT FOODS CAN I EAT? Grains Low-sodium cereals, including oats, puffed wheat and rice,  and shredded wheat cereals. Low-sodium crackers. Unsalted rice and pasta. Lower-sodium bread.  Vegetables Frozen or fresh vegetables. Low-sodium or reduced-sodium canned vegetables. Low-sodium or reduced-sodium tomato sauce and paste. Low-sodium or reduced-sodium tomato and vegetable juices.  Fruits Fresh, frozen, and canned fruit. Fruit juice.  Meat and Other Protein Products Low-sodium canned tuna and salmon. Fresh or frozen meat, poultry, seafood, and fish. Lamb. Unsalted nuts. Dried beans, peas, and lentils without added salt.  Unsalted canned beans. Homemade soups without salt. Eggs.  Dairy Milk. Soy milk. Ricotta cheese. Low-sodium or reduced-sodium cheeses. Yogurt.  Condiments Fresh and dried herbs and spices. Salt-free seasonings. Onion and garlic powders. Low-sodium varieties of mustard and ketchup. Fresh or refrigerated horseradish. Lemon juice.  Fats and Oils Reduced-sodium salad dressings. Unsalted butter.  Other Unsalted popcorn and pretzels.  The items listed above may not be a complete list of recommended foods or beverages. Contact your dietitian for more options. WHAT FOODS ARE NOT RECOMMENDED? Grains Instant hot cereals. Bread stuffing, pancake, and biscuit mixes. Croutons. Seasoned rice or pasta mixes. Noodle soup cups. Boxed or frozen macaroni and cheese. Self-rising flour. Regular salted crackers. Vegetables Regular canned vegetables. Regular canned tomato sauce and paste. Regular tomato and vegetable juices. Frozen vegetables in sauces. Salted [place] fries. Olives. Angie Fava. Relishes. Sauerkraut. Salsa. Meat and Other Protein Products Salted, canned, smoked, spiced, or pickled meats, seafood, or fish. Bacon, ham, sausage, hot dogs, corned beef, chipped beef, and packaged luncheon meats. Salt pork. Jerky. Pickled herring. Anchovies, regular canned tuna, and sardines. Salted nuts. Dairy Processed cheese and cheese spreads. Cheese curds. Blue cheese and cottage cheese. Buttermilk.  Condiments Onion and garlic salt, seasoned salt, table salt, and sea salt. Canned and packaged gravies. Worcestershire sauce. Tartar sauce. Barbecue sauce. Teriyaki sauce. Soy sauce, including reduced sodium. Steak sauce. Fish sauce. Oyster sauce. Cocktail sauce. Horseradish that you find on the shelf. Regular ketchup and mustard. Meat flavorings and tenderizers. Bouillon cubes. Hot sauce. Tabasco sauce. Marinades. Taco seasonings. Relishes. Fats and Oils Regular salad dressings. Salted butter. Margarine. Ghee.  Bacon fat.  Other Potato and tortilla chips. Corn chips and puffs. Salted popcorn and pretzels. Canned or dried soups. Pizza. Frozen entrees and pot pies.  The items listed above may not be a complete list of foods and beverages to avoid. Contact your dietitian for more information.  This information is not intended to replace advice given to you by your health care provider. Make sure you discuss any questions you have with your health care provider.  Document Released: 03/15/2002 Document Revised: 10/14/2014 Document Reviewed: 07/28/2013 Elsevier Interactive Patient Education [employer].     If you need a refill on your cardiac medications before your next appointment, please call your pharmacy.

## 2016-06-27 NOTE — Telephone Encounter (Signed)
Pt aware she has an appointment this morning at 10:45AM with Dr Tamala Julian.

## 2016-06-28 ENCOUNTER — Other Ambulatory Visit: Payer: Self-pay | Admitting: *Deleted

## 2016-06-28 MED ORDER — SPIRONOLACTONE 25 MG PO TABS: 12.5000 mg | ORAL_TABLET | Freq: Every day | ORAL | 3 refills | Status: DC

## 2016-06-28 MED ORDER — AMLODIPINE BESYLATE 10 MG PO TABS: 10.0000 mg | ORAL_TABLET | Freq: Every day | ORAL | 3 refills | Status: DC

## 2016-07-04 ENCOUNTER — Ambulatory Visit (HOSPITAL_COMMUNITY)
Admission: RE | Admit: 2016-07-04 | Discharge: 2016-07-04 | Disposition: A | Discharge status: Home / Self Care | Payer: Medicare Other | Source: Ambulatory Visit | Attending: Cardiovascular Disease | Admitting: Cardiovascular Disease

## 2016-07-04 ENCOUNTER — Other Ambulatory Visit: Payer: Medicare Other | Admitting: *Deleted

## 2016-07-04 DIAGNOSIS — I10 Essential (primary) hypertension: Secondary | ICD-10-CM | POA: Diagnosis not present

## 2016-07-04 LAB — BASIC METABOLIC PANEL
BUN: 28 mg/dL — AB (ref 7–25)
CALCIUM: 9.7 mg/dL (ref 8.6–10.4)
CO2: 22 mmol/L (ref 20–31)
Chloride: 107 mmol/L (ref 98–110)
Creat: 1.26 mg/dL — ABNORMAL HIGH (ref 0.60–0.93)
GLUCOSE: 84 mg/dL (ref 65–99)
Potassium: 4.1 mmol/L (ref 3.5–5.3)
SODIUM: 139 mmol/L (ref 135–146)

## 2016-07-15 ENCOUNTER — Ambulatory Visit (INDEPENDENT_AMBULATORY_CARE_PROVIDER_SITE_OTHER): Payer: Medicare Other | Admitting: Obstetrics & Gynecology

## 2016-07-15 ENCOUNTER — Encounter: Payer: Self-pay | Admitting: Obstetrics & Gynecology

## 2016-07-15 VITALS — BP 130/60 | HR 64 | Resp 16 | Ht 65.75 in | Wt 129.0 lb

## 2016-07-15 DIAGNOSIS — Z01419 Encounter for gynecological examination (general) (routine) without abnormal findings: Secondary | ICD-10-CM

## 2016-07-15 DIAGNOSIS — Z8543 Personal history of malignant neoplasm of ovary: Secondary | ICD-10-CM | POA: Diagnosis not present

## 2016-07-15 NOTE — Progress Notes (Signed)
82 y.o. G0P0000 MarriedCaucasianF here for annual exam.  She reports she is doing well.  Husband, however, had hip replacement and this hasn't been easy.  Pt reports her needs the other hip replaced.  He has also recently been diagnosed with Parkinson's.    Pt reports she's had some mild rectal bleeding.  Has a fissure.    Has lost some weight.  Stressors may have contributed.  States she's been stable where she is for three to four months.  Lowest weight was #126.  PCP:  Dr. Wynetta Emery.  Appt was in September.  Did not   Patient's last menstrual period was 10/07/1974.          Sexually active: No.  The current method of family planning is status post hysterectomy.    Exercising: Yes.    silver sneakers Smoker:  no  Health Maintenance: Pap:  06/05/11 normal  History of abnormal Pap:  no MMG:  04/04/16 BIRADS2:Benign  Colonoscopy:  10/2007  BMD:   04/04/16 Osteopenia  TDaP: w/ PCP Pneumonia vaccine(s):  Done 2-3 years ago Zostavax:   Done with PCP Hep C testing: Not indicated Screening Labs: PCP, Urine today: PCP   reports that she has never smoked. She has never used smokeless tobacco. She reports that she does not drink alcohol or use drugs.  Past Medical History:  Diagnosis Date  . 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 Breast cancer ([place]) 2010  . CAD (coronary artery disease)   . Dysuria   . Frequency of urination   . GERD (gastroesophageal reflux disease)   . Heart murmur   . History of breast cancer ONCOLOGIST-- DR Benay Spice   DX 2010--  RIGHT BREAST DCIS  HIGH GRADE S/P LUMPECTOMY W/ SLN BX--  NO RECURRENCE  . 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 History of skin cancer    excision basal cell  . Hyperlipidemia   . Hypertension   . Kidney stones 7/14  . Nocturia   . 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 Renal stones 2014  . Shingles   . Ureteral calculi    bilateral  . Urgency of urination     Past Surgical History:  Procedure Laterality Date  . APPENDECTOMY    . BREAST LUMPECTOMY Right 04-13-2009   W/ SLN BX  . CARDIAC CATHETERIZATION  11-11-2005 DR Daneen Schick   MODERATE LAD DISEASE/ NORMAL LVF/ EF 65-75%  . CATARACT EXTRACTION W/ INTRAOCULAR LENS IMPLANT Left 04-26-2013  . CHOLECYSTECTOMY  1990  . CYSTOSCOPY W/ URETERAL STENT PLACEMENT Bilateral 05/07/2013   Procedure: CYSTOSCOPY WITH STENT REPLACEMENTS;  Surgeon: Alexis Frock, MD;  Location: [facility];  Service: Urology;  Laterality: Bilateral;  . CYSTOSCOPY WITH BIOPSY N/A 04/07/2013   Procedure: CYSTOSCOPY WITH BIOPSY and fulgeration;  Surgeon: Alexis Frock, MD;  Location: [facility];  Service: Urology;  Laterality: N/A;  . CYSTOSCOPY WITH RETROGRADE PYELOGRAM, URETEROSCOPY AND STENT PLACEMENT Bilateral 05/07/2013   Procedure: CYSTOSCOPY WITH RETROGRADE PYELOGRAM, URETEROSCOPY ;  Surgeon: Alexis Frock, MD;  Location: [facility];  Service: Urology;  Laterality: Bilateral;  . CYSTOSCOPY WITH STENT PLACEMENT Bilateral 04/07/2013   Procedure: CYSTOSCOPY WITH STENT PLACEMENT;  Surgeon: Alexis Frock, MD;  Location: [facility];  Service: Urology;  Laterality: Bilateral;  . DX  LAPAROSCOPY W/ PERITONEAL AND OMENTAL BX'S AND WASHINGS  02-16-2008  . EXCISION RIGHT BREAST MASS  02-10-2002  . EXP. LAP. EXTENSIVE ADHESIOLYSIS/ RESECTION TERMINAL ILEUM , ASCENDING AND DESCENDING COLON WITH CREATION ILEOSTOMY AND MUCOUS FISTULA  02-19-2008   PERFERATION AND ABD. CANCER--  TAKEDOWN ILEOSTOMY 08-23-2008  . TOTAL ABDOMINAL HYSTERECTOMY W/ BILATERAL SALPINGOOPHORECTOMY  1976  . TOTAL ABDOMINAL HYSTERECTOMY W/ BILATERAL SALPINGOOPHORECTOMY  1976  . VULVAR LESION REMOVAL N/A 08/22/2014   Procedure: EXCISION OF VULVAR CYST  ;  Surgeon: Lyman Speller,  MD;  Location: [facility];  Service: Gynecology;  Laterality: N/A;  . WOUND DEBRIDEMENT  05/28/2012   Procedure: DEBRIDEMENT ABDOMINAL WOUND;  Surgeon: Haywood Lasso, MD;  Location: [facility];  Service: General;  Laterality: N/A;  excision chronic wound abdominal wall    Current Outpatient Prescriptions  Medication Sig Dispense Refill  . amLODipine (NORVASC) 10 MG tablet Take 1 tablet (10 mg total) by mouth daily. 90 tablet 3  . aspirin 81 MG tablet Take 81 mg by mouth daily.    . Biotin (BIOTIN MAXIMUM STRENGTH) 10 MG TABS Take 1 tablet by mouth daily.    . Calcium Carbonate-Vitamin D (CALTRATE 600+D PO) Take 1 tablet by mouth 2 (two) times daily.    . carvedilol (COREG) 12.5 MG tablet TAKE 1 TABLET BY MOUTH 2 TIMES DAILY WITH A MEAL. 180 tablet 2  . Cholecalciferol (VITAMIN D) 2000 UNITS tablet Take 2,000 Units by mouth daily.    . colestipol (COLESTID) 1 g tablet Take 2 g by mouth 2 (two) times daily.    Marland Kitchen lidocaine (XYLOCAINE) 2 % jelly Apply 1 application topically daily as needed (for  pain).    . Multiple Vitamins-Minerals (MULTIVITAMIN PO) Take 1 tablet by mouth daily.    Marland Kitchen PROCTOZONE-HC 2.5 % rectal cream PLACE 1 APPLICATION RECTALLY 2 TIMES A DAY AS NEEDED FOR HEMORRHOIDS. 60 g 11  . spironolactone (ALDACTONE) 25 MG tablet Take 0.5 tablets (12.5 mg total) by mouth daily. 45 tablet 3  . valsartan (DIOVAN) 320 MG tablet Take 320 mg by mouth every morning.     No current facility-administered medications for this visit.     Family History  Problem Relation Age of Onset  . Heart disease Mother   . Hyperlipidemia Mother   . Breast cancer Sister   . Cancer Sister     breast    ROS:  Pertinent items are noted in HPI.  Otherwise, a comprehensive ROS was negative.  Exam:   BP 130/60 (BP Location: Right Arm, Patient Position: Sitting, Cuff Size: Normal)   Pulse 64   Resp 16   Ht 5' 5.75" (1.67 m)   Wt 129 lb (58.5 kg)   LMP 10/07/1974   BMI 20.98 kg/m    Weight change: -16#  Height: 5' 5.75" (167 cm)  Ht Readings from Last 3 Encounters:  07/15/16 5' 5.75" (1.67 m)  06/27/16 5\' 6"  (1.676 m)  09/15/15 5' 6.25" (1.683 m)   General appearance: alert, cooperative and appears stated age Head: Normocephalic, without obvious abnormality, atraumatic Neck: no adenopathy, supple, symmetrical, trachea midline and thyroid normal to inspection and palpation Lungs: clear to auscultation bilaterally Breasts: normal appearance, no masses or tenderness Heart: regular rate and rhythm Abdomen: soft, non-tender; bowel sounds normal; no masses,  no organomegaly Extremities: extremities normal, atraumatic, no cyanosis or edema Skin: Skin color, texture, turgor normal. No rashes or lesions Lymph nodes: Cervical, supraclavicular, and axillary nodes normal. No abnormal inguinal  nodes palpated Neurologic: Grossly normal  Pelvic: External genitalia:  no lesions              Urethra:  normal appearing urethra with no masses, tenderness or lesions              Bartholins and Skenes: normal                 Vagina: normal appearing vagina with normal color and discharge, no lesions              Cervix: absent              Pap taken: No. Bimanual Exam:  Uterus:  uterus absent              Adnexa: no mass, fullness, tenderness               Rectovaginal: Confirms               Anus:  normal sphincter tone, hemorrhoids present  Chaperone was present for exam.  A:  Well Woman with normal exam H/o breast ductal ca 6/10 (high grade DCIS with 0.29mm area of invasion), s/p radiation.  On arimidex 7/10-5/15. 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  She is doing 3D MMGs. Pap not indicated.  H/o hysterectomy 1976 Ca-125.  Declines additional follow up with Gyn/Onc  at this time.   Labs work/vaccines/B12 injections with Dr. Wynetta Emery AEX 1 year or follow up prn

## 2016-07-16 LAB — CA 125: CA 125: 12 U/mL (ref <35)

## 2016-07-18 ENCOUNTER — Ambulatory Visit: Payer: Medicare Other | Admitting: Obstetrics & Gynecology

## 2016-07-22 ENCOUNTER — Ambulatory Visit (INDEPENDENT_AMBULATORY_CARE_PROVIDER_SITE_OTHER): Payer: Medicare Other | Admitting: Interventional Cardiology

## 2016-07-22 ENCOUNTER — Encounter: Payer: Self-pay | Admitting: Interventional Cardiology

## 2016-07-22 VITALS — BP 186/74 | HR 61 | Ht 66.0 in | Wt 129.6 lb

## 2016-07-22 DIAGNOSIS — I10 Essential (primary) hypertension: Secondary | ICD-10-CM

## 2016-07-22 DIAGNOSIS — I2510 Atherosclerotic heart disease of native coronary artery without angina pectoris: Secondary | ICD-10-CM

## 2016-07-22 DIAGNOSIS — E784 Other hyperlipidemia: Secondary | ICD-10-CM

## 2016-07-22 DIAGNOSIS — E538 Deficiency of other specified B group vitamins: Secondary | ICD-10-CM | POA: Diagnosis not present

## 2016-07-22 DIAGNOSIS — E7849 Other hyperlipidemia: Secondary | ICD-10-CM

## 2016-07-22 MED ORDER — FUROSEMIDE 40 MG PO TABS: 40.0000 mg | ORAL_TABLET | Freq: Every day | ORAL | 6 refills | Status: DC

## 2016-07-22 NOTE — Progress Notes (Signed)
Cardiology Office Note    Date:  07/22/2016   ID:  [name], DOB 09-May-1934, MRN [id number]  PCP:  Garlan Fair, MD  Cardiologist: Sinclair Grooms, MD   Chief Complaint  Patient presents with  . Coronary Artery Disease    History of Present Illness:  [name] is a 82 y.o. female with moderate CAD, hypertension, hyperlipidemia, and premature atrial contractions.  She feels well. She has been monitoring her BP. They have been labile but mostly high. At last visit a renal duplex was ordered.The study noted below did not reveal RAS.  At last visit, Amlodipine was increased to 10 mg daily and Spironolactone was started. Back for follow-up.   Past Medical History:  Diagnosis Date  . 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 Breast cancer ([place]) 2010  . CAD (coronary artery disease)   . Dysuria   . Frequency of urination   . GERD (gastroesophageal reflux disease)   . Heart murmur   . History of breast cancer ONCOLOGIST-- DR Benay Spice   DX 2010--  RIGHT BREAST DCIS  HIGH GRADE S/P LUMPECTOMY W/ SLN BX--  NO RECURRENCE  . 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 History of skin cancer    excision basal cell  . Hyperlipidemia   . Hypertension   . Kidney stones 7/14  . Nocturia   . 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 Renal stones 2014  . Shingles   . Ureteral calculi    bilateral  . Urgency of urination     Past Surgical History:  Procedure Laterality Date  . APPENDECTOMY    . BREAST LUMPECTOMY Right 04-13-2009   W/ SLN BX  . CARDIAC CATHETERIZATION  11-11-2005 DR Daneen Schick   MODERATE LAD DISEASE/ NORMAL LVF/ EF 65-75%  . CATARACT EXTRACTION W/ INTRAOCULAR LENS IMPLANT Left 04-26-2013  . CHOLECYSTECTOMY  1990  .  CYSTOSCOPY W/ URETERAL STENT PLACEMENT Bilateral 05/07/2013   Procedure: CYSTOSCOPY WITH STENT REPLACEMENTS;  Surgeon: Alexis Frock, MD;  [facility];  Service: Urology;  Laterality: Bilateral;  . CYSTOSCOPY WITH BIOPSY N/A 04/07/2013   Procedure: CYSTOSCOPY WITH BIOPSY and fulgeration;  Surgeon: Alexis Frock, MD;  [facility];  Service: Urology;  Laterality: N/A;  . CYSTOSCOPY WITH RETROGRADE PYELOGRAM, URETEROSCOPY AND STENT PLACEMENT Bilateral 05/07/2013   Procedure: CYSTOSCOPY WITH RETROGRADE PYELOGRAM, URETEROSCOPY ;  Surgeon: Alexis Frock, MD;  Location: [facility];  Service: Urology;  Laterality: Bilateral;  . CYSTOSCOPY WITH STENT PLACEMENT Bilateral 04/07/2013   Procedure: CYSTOSCOPY WITH STENT PLACEMENT;  Surgeon: Alexis Frock, MD;  [facility];  Service: Urology;  Laterality: Bilateral;  . DX LAPAROSCOPY W/ PERITONEAL AND OMENTAL BX'S AND WASHINGS  02-16-2008  . EXCISION RIGHT BREAST MASS  02-10-2002  . EXP. LAP. EXTENSIVE ADHESIOLYSIS/ RESECTION TERMINAL ILEUM , ASCENDING AND DESCENDING COLON WITH CREATION ILEOSTOMY AND MUCOUS FISTULA  02-19-2008   PERFERATION AND ABD. CANCER--  TAKEDOWN ILEOSTOMY 08-23-2008  . TOTAL ABDOMINAL HYSTERECTOMY W/ BILATERAL SALPINGOOPHORECTOMY  1976  . TOTAL ABDOMINAL HYSTERECTOMY W/ BILATERAL SALPINGOOPHORECTOMY  1976  . VULVAR LESION REMOVAL N/A 08/22/2014   Procedure: EXCISION OF VULVAR CYST  ;  Surgeon: Lyman Speller, MD;  Location: [facility];  Service: Gynecology;  Laterality: N/A;  .  WOUND DEBRIDEMENT  05/28/2012   Procedure: DEBRIDEMENT ABDOMINAL WOUND;  Surgeon: Haywood Lasso, MD;  Location: [facility] [place];  Service: General;  Laterality: N/A;  excision chronic wound abdominal wall    Current Medications: Outpatient Medications Prior to Visit  Medication Sig Dispense Refill  . amLODipine (NORVASC) 10 MG tablet Take 1 tablet (10 mg total) by mouth daily. 90 tablet 3  . aspirin  81 MG tablet Take 81 mg by mouth daily.    . Biotin (BIOTIN MAXIMUM STRENGTH) 10 MG TABS Take 1 tablet by mouth daily.    . Calcium Carbonate-Vitamin D (CALTRATE 600+D PO) Take 1 tablet by mouth 2 (two) times daily.    . carvedilol (COREG) 12.5 MG tablet TAKE 1 TABLET BY MOUTH 2 TIMES DAILY WITH A MEAL. 180 tablet 2  . Cholecalciferol (VITAMIN D) 2000 UNITS tablet Take 2,000 Units by mouth daily.    . colestipol (COLESTID) 1 g tablet Take 2 g by mouth 2 (two) times daily.    Marland Kitchen lidocaine (XYLOCAINE) 2 % jelly Apply 1 application topically daily as needed (for  pain).    . Multiple Vitamins-Minerals (MULTIVITAMIN PO) Take 1 tablet by mouth daily.    Marland Kitchen PROCTOZONE-HC 2.5 % rectal cream PLACE 1 APPLICATION RECTALLY 2 TIMES A DAY AS NEEDED FOR HEMORRHOIDS. 60 g 11  . spironolactone (ALDACTONE) 25 MG tablet Take 0.5 tablets (12.5 mg total) by mouth daily. 45 tablet 3  . valsartan (DIOVAN) 320 MG tablet Take 320 mg by mouth every morning.     No facility-administered medications prior to visit.      Allergies:   Review of patient's allergies indicates no known allergies.   Social History   Social History  . Marital status: Married    Spouse name: N/A  . Number of children: N/A  . Years of education: N/A   Social History Main Topics  . Smoking status: Never Smoker  . Smokeless tobacco: Never Used  . Alcohol use No  . Drug use: No  . Sexual activity: No     Comment: Hysterectomy   Other Topics Concern  . None   Social History Narrative  . None     Family History:  The patient's family history includes Breast cancer in her sister; Cancer in her sister; Heart disease in her mother; Hyperlipidemia in her mother.   ROS:   Please see the history of present illness.    None  All other systems reviewed and are negative.   PHYSICAL EXAM:   VS:  BP (!) 186/74   Pulse 61   Ht 5\' 6"  (1.676 m)   Wt 129 lb 9.6 oz (58.8 kg)   LMP 10/07/1974   BMI 20.92 kg/m    GEN: Well nourished,  well developed, in no acute distress  HEENT: normal  Neck: no JVD, carotid bruits, or masses Cardiac: RRR; no murmurs, rubs, or gallops,no edema  Respiratory:  clear to auscultation bilaterally, normal work of breathing GI: soft, nontender, nondistended, + BS MS: no deformity or atrophy  Skin: warm and dry, no rash Neuro:  Alert and Oriented x 3, Strength and sensation are intact Psych: euthymic mood, full affect  Wt Readings from Last 3 Encounters:  07/22/16 129 lb 9.6 oz (58.8 kg)  07/15/16 129 lb (58.5 kg)  06/27/16 131 lb 12.8 oz (59.8 kg)      Studies/Labs Reviewed:   EKG:  EKG  Not repeated.  Recent Labs: 07/04/2016: BUN 28; Creat 1.26; Potassium 4.1;  Sodium 139   Lipid Panel    Component Value Date/Time   CHOL  02/29/2008 0515    61        ATP III CLASSIFICATION:  <200     mg/dL   Desirable  200-239  mg/dL   Borderline High  >=240    mg/dL   High   TRIG 110 02/29/2008 0515    Additional studies/ records that were reviewed today include:  Bilateral kidney duplex scan 07/04/16: Impressions Normal caliber abdominal aorta. Normal bilateral kidney size. Normal renal arteries, bilaterally The IVC and renal veins are patent   ASSESSMENT:    1. Essential hypertension   2. Coronary artery disease involving native coronary artery of native heart without angina pectoris   3. Other hyperlipidemia      PLAN:  In order of problems listed above:  1. Still elevated. Discontinue Aldactone and start furosemide 40 mg daily. Basic metabolic panel in 10 days. Blood pressure clinic follow-up in 3 weeks. May need to add hydralazine.    Medication Adjustments/Labs and Tests Ordered: Current medicines are reviewed at length with the patient today.  Concerns regarding medicines are outlined above.  Medication changes, Labs and Tests ordered today are listed in the Patient Instructions below. There are no Patient Instructions on file for this visit.   Signed, Sinclair Grooms, MD  07/22/2016 4:23 PM    [facility], [place], [place]  [address] Phone: [phone]; Fax: [phone]

## 2016-07-22 NOTE — Patient Instructions (Signed)
Medication Instructions:  Your physician has recommended you make the following change in your medication:  Stop aldactone. Start Furosemide 40 mg by mouth daily.    Labwork: Your physician recommends that you return for lab work in:7-10 days.  --BMP   Testing/Procedures: none  Follow-Up: Please schedule appointment for patient with pharmacist in blood pressure clinic for 3 weeks from now.     Your physician wants you to follow-up in: 6-12 months with Dr. Tamala Julian.  You will receive a reminder letter in the mail two months in advance. If you don't receive a letter, please call our office to schedule the follow-up appointment.   Any Other Special Instructions Will Be Listed Below (If Applicable).     If you need a refill on your cardiac medications before your next appointment, please call your pharmacy.

## 2016-08-01 ENCOUNTER — Other Ambulatory Visit: Payer: Medicare Other | Admitting: *Deleted

## 2016-08-01 DIAGNOSIS — I10 Essential (primary) hypertension: Secondary | ICD-10-CM | POA: Diagnosis not present

## 2016-08-01 LAB — BASIC METABOLIC PANEL
BUN: 38 mg/dL — ABNORMAL HIGH (ref 7–25)
CHLORIDE: 106 mmol/L (ref 98–110)
CO2: 24 mmol/L (ref 20–31)
Calcium: 9.6 mg/dL (ref 8.6–10.4)
Creat: 1.44 mg/dL — ABNORMAL HIGH (ref 0.60–0.93)
Glucose, Bld: 114 mg/dL — ABNORMAL HIGH (ref 65–99)
POTASSIUM: 4.5 mmol/L (ref 3.5–5.3)
SODIUM: 139 mmol/L (ref 135–146)

## 2016-08-16 ENCOUNTER — Ambulatory Visit: Payer: Medicare Other

## 2016-08-19 ENCOUNTER — Ambulatory Visit (INDEPENDENT_AMBULATORY_CARE_PROVIDER_SITE_OTHER): Payer: Medicare Other | Admitting: Pharmacist

## 2016-08-19 VITALS — BP 160/58 | HR 66

## 2016-08-19 DIAGNOSIS — I10 Essential (primary) hypertension: Secondary | ICD-10-CM | POA: Diagnosis not present

## 2016-08-19 DIAGNOSIS — I2510 Atherosclerotic heart disease of native coronary artery without angina pectoris: Secondary | ICD-10-CM

## 2016-08-19 MED ORDER — HYDRALAZINE HCL 25 MG PO TABS: 25.0000 mg | ORAL_TABLET | Freq: Three times a day (TID) | ORAL | 11 refills | Status: DC

## 2016-08-19 NOTE — Patient Instructions (Addendum)
Stop taking furosemide (Lasix) 40 mg daily.   Start taking hydralazine 25 mg three times daily.   Continue taking amlodipine 10 mg daily, valsartan 320 mg daily,  carvedilol 12.5 mg twice daily.   Continue to monitor your blood pressure at home.   Return to clinic in one-month for follow-up.

## 2016-08-19 NOTE — Progress Notes (Signed)
Patient ID: [id number] DESO                 DOB: 09/22/1934                      MRN: [id number]     HPI: [name] is a 82 y.o. female referred by Dr. Tamala Julian to HTN clinic for medication management. PMH of moderate CAD, HTN, HLD, and premature atrial contractions. Last BP reading in clinic was 186/74 on amlodipine 10 mg, spironolactone 12.5 mg daily, carvedilol 12.5 BID, and valsartan 320 mg. Home BP readings were noted to be labile and high by Dr. Tamala Julian. He d/c'd the pt's spironolactone and started furosemide 40 mg daily.   Patient denies dizziness, falls, or headache. Does have occasional orthostasis but states it resolves quickly. Recent renal ultrasound came back negative for renal artery stenosis. She did not report any issues when she took spironolactone, but was concerned about monitoring her potassium intake. She endorses medication adherence.  Calculated CrCl is 62ml/min - will avoid thiazides d/t lack of efficacy.  Current HTN meds: amlodipine 10 mg, valsartan 320 mg, furosemide 40 mg daily, carvedilol 12.5 BID Previously tried: spironolactone 12.5 mg, HCTZ (d/c'd since she was prediabetic) BP goal: <140/90 mmHg  Family History:  Heart disease and HLD (mother), cancer/breast cancer (sister)  Social History: Never smoker, denies alcohol and illicit drug use  Diet:  Patient endorses reading labels, avoiding canned foods, and using salt substitutes. She reports eating lean meats and vegetable. She drinks iced tea, but not coffee.   Exercise: She goes to [employer] 1-2 week and stays active with housekeeping.   Home BP readings: SBP ranges from 140-160s. She takes reading TID.   Wt Readings from Last 3 Encounters:  07/22/16 129 lb 9.6 oz (58.8 kg)  07/15/16 129 lb (58.5 kg)  06/27/16 131 lb 12.8 oz (59.8 kg)   BP Readings from Last 3 Encounters:  07/22/16 (!) 186/74  07/15/16 130/60  06/27/16 (!) 170/72   Pulse Readings from Last 3 Encounters:  07/22/16 61    07/15/16 64  06/27/16 68    Renal function: CrCl cannot be calculated (Unknown ideal weight.).  Past Medical History:  Diagnosis Date  . 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 Breast cancer ([place]) 2010  . CAD (coronary artery disease)   . Dysuria   . Frequency of urination   . GERD (gastroesophageal reflux disease)   . Heart murmur   . History of breast cancer ONCOLOGIST-- DR Benay Spice   DX 2010--  RIGHT BREAST DCIS  HIGH GRADE S/P LUMPECTOMY W/ SLN BX--  NO RECURRENCE  . 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 History of skin cancer    excision basal cell  . Hyperlipidemia   . Hypertension   . Kidney stones 7/14  . Nocturia   . 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 Renal stones 2014  . Shingles   . Ureteral calculi    bilateral  . Urgency of urination     Current Outpatient Prescriptions on File Prior to Visit  Medication Sig Dispense Refill  . amLODipine (NORVASC) 10 MG tablet Take 1 tablet (10 mg total) by mouth daily. 90 tablet 3  . aspirin 81 MG tablet Take 81  mg by mouth daily.    . Biotin (BIOTIN MAXIMUM STRENGTH) 10 MG TABS Take 1 tablet by mouth daily.    . Calcium Carbonate-Vitamin D (CALTRATE 600+D PO) Take 1 tablet by mouth 2 (two) times daily.    . carvedilol (COREG) 12.5 MG tablet TAKE 1 TABLET BY MOUTH 2 TIMES DAILY WITH A MEAL. 180 tablet 2  . Cholecalciferol (VITAMIN D) 2000 UNITS tablet Take 2,000 Units by mouth daily.    . colestipol (COLESTID) 1 g tablet Take 2 g by mouth 2 (two) times daily.    . furosemide (LASIX) 40 MG tablet Take 1 tablet (40 mg total) by mouth daily. 30 tablet 6  . lidocaine (XYLOCAINE) 2 % jelly Apply 1 application topically daily as needed (for  pain).    . Multiple Vitamins-Minerals (MULTIVITAMIN PO)  Take 1 tablet by mouth daily.    Marland Kitchen PROCTOZONE-HC 2.5 % rectal cream PLACE 1 APPLICATION RECTALLY 2 TIMES A DAY AS NEEDED FOR HEMORRHOIDS. 60 g 11  . valsartan (DIOVAN) 320 MG tablet Take 320 mg by mouth every morning.     No current facility-administered medications on file prior to visit.     No Known Allergies   Assessment/Plan:  1. Hypertension - BP 160/34mmHg above goal <140/66mmHg. Will avoid thiazide diuretics with CrCl of 30. Discussed option of restarting spironolactone vs hydralazine. Pt is concerned with monitoring her potassium intake and would prefer to start hydralazine. Will start hydralazine 25mg  TID and discontinue furosemide - pt does not complain of swelling or improved BP readings when she took it. Will f/u in 1 month for BP check.  Patient seen by Leroy Libman, P4 pharmacy student.   Elsbeth Yearick E. Blenda Wisecup, PharmD, [place] [id number], [place], [place] [address] Phone: [phone]; Fax: [phone] 08/19/2016 4:49 PM

## 2016-08-26 DIAGNOSIS — E538 Deficiency of other specified B group vitamins: Secondary | ICD-10-CM | POA: Diagnosis not present

## 2016-09-10 DIAGNOSIS — M1990 Unspecified osteoarthritis, unspecified site: Secondary | ICD-10-CM | POA: Diagnosis not present

## 2016-09-10 DIAGNOSIS — M79641 Pain in right hand: Secondary | ICD-10-CM | POA: Diagnosis not present

## 2016-09-17 ENCOUNTER — Ambulatory Visit (INDEPENDENT_AMBULATORY_CARE_PROVIDER_SITE_OTHER): Payer: Medicare Other | Admitting: Pharmacist

## 2016-09-17 VITALS — BP 138/64 | HR 62 | Wt 132.0 lb

## 2016-09-17 DIAGNOSIS — I10 Essential (primary) hypertension: Secondary | ICD-10-CM

## 2016-09-17 DIAGNOSIS — I2510 Atherosclerotic heart disease of native coronary artery without angina pectoris: Secondary | ICD-10-CM

## 2016-09-17 NOTE — Patient Instructions (Addendum)
Return for a follow up appointment in 3-4 weeks  Check your blood pressure at home daily (if able) and keep record of the readings.  Take your BP meds as follows: INCREASE hydralazine to 50mg  (2 tablets of your current supply) in the afternoon and evening, continue 25mg  each morning Continue all other medications as prescribed  Bring all of your meds, your BP cuff and your record of home blood pressures to your next appointment.  Exercise as you're able, try to walk approximately 30 minutes per day.  Keep salt intake to a minimum, especially watch canned and prepared boxed foods.  Eat more fresh fruits and vegetables and fewer canned items.  Avoid eating in fast food restaurants.    HOW TO TAKE YOUR BLOOD PRESSURE: . Rest 5 minutes before taking your blood pressure. .  Don't smoke or drink caffeinated beverages for at least 30 minutes before. . Take your blood pressure before (not after) you eat. . Sit comfortably with your back supported and both feet on the floor (don't cross your legs). . Elevate your arm to heart level on a table or a desk. . Use the proper sized cuff. It should fit smoothly and snugly around your bare upper arm. There should be enough room to slip a fingertip under the cuff. The bottom edge of the cuff should be 1 inch above the crease of the elbow. . Ideally, take 3 measurements at one sitting and record the average.

## 2016-09-17 NOTE — Progress Notes (Signed)
Patient ID: [name]                 DOB: 07/28/1934                      MRN: [id number]     HPI: [name] is a 82 y.o. female patient of Dr. Tamala Julian with PMH below who presents today for hypertension follow up. Home BP readings were noted to be labile and high by Dr. Tamala Julian. He d/c'd the pt's spironolactone and started furosemide 40 mg daily. Recent renal ultrasound came back negative for renal artery stenosis. At her most recent visit she was started on hydralazine 25mg  TID and her furosemide was discontinued.   She presents today with a log of her pressures. Her morning measurements are mostly 130s/60s. Her evening and afternoon pressures are mostly 150s/60-70s. She does have several measurements >180 recently. She denies any stress or other reasons pressures may be elevated. She denies missed doses of medication.   She denies any swelling or weight gain with discontinuation of diuretic.   Calculated CrCl is 51ml/min - will avoid thiazides d/t lack of efficacy.  Cardiac Hx: moderate CAD, HTN, HLD, and premature atrial contractions  Current HTN meds:  amlodipine 10 mg QPM  valsartan 320 mg QAM carvedilol 12.5 BID hydralazine 25mg  TID  Previously tried: spironolactone 12.5 mg, HCTZ (d/c'd since she was prediabetic)  BP goal: <140/90  Family History:  Heart disease and HLD (mother), cancer/breast cancer (sister)  Social History: Never smoker, denies alcohol and illicit drug use  Diet:  Patient endorses reading labels, avoiding canned foods, and using salt substitutes. She reports eating lean meats and vegetable. She drinks iced tea, but not coffee.   Exercise: She goes to [employer] 1-2 week and stays active with housekeeping.  Home BP readings:  See above  Wt Readings from Last 3 Encounters:  09/17/16 132 lb (59.9 kg)  07/22/16 129 lb 9.6 oz (58.8 kg)  07/15/16 129 lb (58.5 kg)   BP Readings from Last 3 Encounters:  09/17/16 138/64  08/19/16 (!)  160/58  07/22/16 (!) 186/74   Pulse Readings from Last 3 Encounters:  09/17/16 62  08/19/16 66  07/22/16 61    Renal function: CrCl cannot be calculated (Patient's most recent lab result is older than the maximum 21 days allowed.).  Past Medical History:  Diagnosis Date  . 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 Breast cancer ([place]) 2010  . CAD (coronary artery disease)   . Dysuria   . Frequency of urination   . GERD (gastroesophageal reflux disease)   . Heart murmur   . History of breast cancer ONCOLOGIST-- DR Benay Spice   DX 2010--  RIGHT BREAST DCIS  HIGH GRADE S/P LUMPECTOMY W/ SLN BX--  NO RECURRENCE  . 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 History of skin cancer    excision basal cell  . Hyperlipidemia   . Hypertension   . Kidney stones 7/14  . Nocturia   . 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 Renal stones 2014  . Shingles   . Ureteral calculi    bilateral  . Urgency of urination     Current Outpatient Prescriptions on File Prior to Visit  Medication Sig Dispense  Refill  . amLODipine (NORVASC) 10 MG tablet Take 1 tablet (10 mg total) by mouth daily. 90 tablet 3  . aspirin 81 MG tablet Take 81 mg by mouth daily.    . Biotin (BIOTIN MAXIMUM STRENGTH) 10 MG TABS Take 1 tablet by mouth daily.    . Calcium Carbonate-Vitamin D (CALTRATE 600+D PO) Take 1 tablet by mouth 2 (two) times daily.    . carvedilol (COREG) 12.5 MG tablet TAKE 1 TABLET BY MOUTH 2 TIMES DAILY WITH A MEAL. 180 tablet 2  . Cholecalciferol (VITAMIN D) 2000 UNITS tablet Take 2,000 Units by mouth daily.    . colestipol (COLESTID) 1 g tablet Take 2 g by mouth 2 (two) times daily.    Marland Kitchen lidocaine (XYLOCAINE) 2 % jelly Apply 1 application topically daily as needed (for  pain).      . Multiple Vitamins-Minerals (MULTIVITAMIN PO) Take 1 tablet by mouth daily.    Marland Kitchen PROCTOZONE-HC 2.5 % rectal cream PLACE 1 APPLICATION RECTALLY 2 TIMES A DAY AS NEEDED FOR HEMORRHOIDS. 60 g 11  . valsartan (DIOVAN) 320 MG tablet Take 320 mg by mouth every morning.     No current facility-administered medications on file prior to visit.     No Known Allergies  Blood pressure 138/64, pulse 62, weight 132 lb (59.9 kg), last menstrual period 10/07/1974, SpO2 99 %.   Assessment/Plan: Hypertension: BP at goal in office today but seems to be elevated at home, especially during the evening. Increase only her afternoon and evening dose of hydralazine to 50mg  and continue morning dose of hydralazine at 25mg  due to controlled pressures in morning time. Continue to monitor and bring cuff to next visit. Follow up in hypertension clinic in 3-4 weeks.    Thank you, Lelan Pons. Patterson Hammersmith, [place]

## 2016-09-19 ENCOUNTER — Encounter: Payer: Self-pay | Admitting: Pharmacist

## 2016-09-26 DIAGNOSIS — E538 Deficiency of other specified B group vitamins: Secondary | ICD-10-CM | POA: Diagnosis not present

## 2016-10-08 ENCOUNTER — Ambulatory Visit (INDEPENDENT_AMBULATORY_CARE_PROVIDER_SITE_OTHER): Payer: Medicare Other | Admitting: Pharmacist

## 2016-10-08 VITALS — BP 172/60 | HR 65

## 2016-10-08 DIAGNOSIS — I10 Essential (primary) hypertension: Secondary | ICD-10-CM

## 2016-10-08 MED ORDER — HYDRALAZINE HCL 25 MG PO TABS: ORAL_TABLET | ORAL | 3 refills | Status: DC

## 2016-10-08 MED ORDER — HYDRALAZINE HCL 100 MG PO TABS: ORAL_TABLET | ORAL | 3 refills | Status: DC

## 2016-10-08 NOTE — Progress Notes (Signed)
Patient ID: [name]                 DOB: Jan 28, 1934                      MRN: [id number]     HPI: [name] is a 83 y.o. female referred by Dr. Tamala Julian to HTN clinic for medication management. PMH of moderate CAD, HTN, HLD, and premature atrial contractions. Renal ultrasound negative for renal artery stenosis. Pt's afternoon and evening dose of hydralazine was increased to 50mg  at last OV to help target isolated elevated BP readings in the afternoon.  Pt reports that her BP readings have remained about the same. She is compliant with her medications. Taking amlodipine in the evening and valsartan in the morning. Minimal sodium in her diet, does drink black tea in the afternoon and evening. Denies dizziness, headache, and blurred vision. She brings her home BP cuff with her to clinic today. BP reading using home cuff is 176/73, clinic cuff reading 172/60. She checks her BP at home 2-3 times daily, always 2-3 hours after taking her BP medications. Readings consistently trend up throughout the day. Morning readings (10-11am) range 120-140s/50-60s. Afternoon readings (2-3pm) 140-160s/50-60s. Night readings (10-11pm) 160-180s/60-70s. She denies stress and pain.  Current HTN meds: amlodipine 10 mg, valsartan 320 mg, carvedilol 12.5 BID, hydralazine 25mg  AM and 50mg  afternoon and PM  Previously tried: spironolactone 12.5 mg, HCTZ (will avoid thiazides d/t CrCl of 78mL/min) BP goal: <140/90 mmHg  Family History:  Heart disease and HLD (mother), cancer/breast cancer (sister)  Social History: Never smoker, denies alcohol and illicit drug use  Diet:  Patient endorses reading labels, avoiding canned foods, and using salt substitutes. She reports eating lean meats and vegetable. She drinks iced tea, but not coffee. Cut out chips from her diet.  Exercise: She goes to [employer] 1-2 week and stays active with housekeeping.   Home BP readings: SBP ranges from 140-160s. She takes reading  TID.   Wt Readings from Last 3 Encounters:  09/17/16 132 lb (59.9 kg)  07/22/16 129 lb 9.6 oz (58.8 kg)  07/15/16 129 lb (58.5 kg)   BP Readings from Last 3 Encounters:  09/17/16 138/64  08/19/16 (!) 160/58  07/22/16 (!) 186/74   Pulse Readings from Last 3 Encounters:  09/17/16 62  08/19/16 66  07/22/16 61    Renal function: CrCl cannot be calculated (Patient's most recent lab result is older than the maximum 21 days allowed.).  Past Medical History:  Diagnosis Date  . 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 Breast cancer ([place]) 2010  . CAD (coronary artery disease)   . Dysuria   . Frequency of urination   . GERD (gastroesophageal reflux disease)   . Heart murmur   . History of breast cancer ONCOLOGIST-- DR Benay Spice   DX 2010--  RIGHT BREAST DCIS  HIGH GRADE S/P LUMPECTOMY W/ SLN BX--  NO RECURRENCE  . 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 History of skin cancer    excision basal cell  . Hyperlipidemia   . Hypertension   . Kidney stones 7/14  . Nocturia   . 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 Renal stones 2014  . Shingles   . Ureteral  calculi    bilateral  . Urgency of urination     Current Outpatient Prescriptions on File Prior to Visit  Medication Sig Dispense Refill  . amLODipine (NORVASC) 10 MG tablet Take 1 tablet (10 mg total) by mouth daily. 90 tablet 3  . aspirin 81 MG tablet Take 81 mg by mouth daily.    . Biotin (BIOTIN MAXIMUM STRENGTH) 10 MG TABS Take 1 tablet by mouth daily.    . Calcium Carbonate-Vitamin D (CALTRATE 600+D PO) Take 1 tablet by mouth 2 (two) times daily.    . carvedilol (COREG) 12.5 MG tablet TAKE 1 TABLET BY MOUTH 2 TIMES DAILY WITH A MEAL. 180 tablet 2  . Cholecalciferol (VITAMIN D) 2000 UNITS tablet  Take 2,000 Units by mouth daily.    . colestipol (COLESTID) 1 g tablet Take 2 g by mouth 2 (two) times daily.    . hydrALAZINE (APRESOLINE) 25 MG tablet Take 1 tablet in the morning and 2 tablets in afternoon and evening 90 tablet 11  . lidocaine (XYLOCAINE) 2 % jelly Apply 1 application topically daily as needed (for  pain).    . Multiple Vitamins-Minerals (MULTIVITAMIN PO) Take 1 tablet by mouth daily.    Marland Kitchen PROCTOZONE-HC 2.5 % rectal cream PLACE 1 APPLICATION RECTALLY 2 TIMES A DAY AS NEEDED FOR HEMORRHOIDS. 60 g 11  . valsartan (DIOVAN) 320 MG tablet Take 320 mg by mouth every morning.     No current facility-administered medications on file prior to visit.     No Known Allergies   Assessment/Plan:  1. Hypertension - BP above goal in clinic. Home readings consistently trend up throughout the day, starting 120-140s/50-60s in the morning but increasing to 160-180s/60-70s by night time. Only change throughout the day is 2 cups of caffeinated tea in the afternoon and evening. Will have pt cut back on tea intake. Will continue hydralazine 25mg  in the AM to prevent hypotension, and increase afternoon and evening doses to 100mg  to target isolated systolic readings later on in the day. Pt will continue amlodipine, valsartan, and carvedilol - already appropriately spacing out the timing of these meds. F/u in HTN clinic in 1 month.   Issaac Shipper E. Tay Whitwell, PharmD, CPP, [place] [id number] [place], [place] [address] Phone: [phone]; Fax: [phone] 10/08/2016 2:49 PM

## 2016-10-08 NOTE — Patient Instructions (Addendum)
Increase your hydralazine and start taking 25mg  in the morning, 100mg  in the afternoon, and 100mg  in the evening.  Continue taking your amlodipine and valsartan once daily.  Recheck your blood pressure at home after a few minutes if you notice top readings above 180.  Follow up in blood pressure clinic in 1 month on Friday, February 2nd at 2pm.

## 2016-10-11 DIAGNOSIS — J069 Acute upper respiratory infection, unspecified: Secondary | ICD-10-CM | POA: Diagnosis not present

## 2016-10-30 DIAGNOSIS — E538 Deficiency of other specified B group vitamins: Secondary | ICD-10-CM | POA: Diagnosis not present

## 2016-11-08 ENCOUNTER — Ambulatory Visit (INDEPENDENT_AMBULATORY_CARE_PROVIDER_SITE_OTHER): Payer: Medicare Other | Admitting: Pharmacist

## 2016-11-08 VITALS — BP 168/52

## 2016-11-08 DIAGNOSIS — I10 Essential (primary) hypertension: Secondary | ICD-10-CM

## 2016-11-08 NOTE — Patient Instructions (Signed)
Return for a follow up appointment in 3-4 weeks  Check your blood pressure at home daily (if able) and keep record of the readings.  Take your BP meds as follows: TAKE your amlodipine at lunch time to help with afternoon/evening elevated pressures Continue all other medications as prescribed  Bring all of your meds, your BP cuff and your record of home blood pressures to your next appointment.  Exercise as you're able, try to walk approximately 30 minutes per day.  Keep salt intake to a minimum, especially watch canned and prepared boxed foods.  Eat more fresh fruits and vegetables and fewer canned items.  Avoid eating in fast food restaurants.    HOW TO TAKE YOUR BLOOD PRESSURE: . Rest 5 minutes before taking your blood pressure. .  Don't smoke or drink caffeinated beverages for at least 30 minutes before. . Take your blood pressure before (not after) you eat. . Sit comfortably with your back supported and both feet on the floor (don't cross your legs). . Elevate your arm to heart level on a table or a desk. . Use the proper sized cuff. It should fit smoothly and snugly around your bare upper arm. There should be enough room to slip a fingertip under the cuff. The bottom edge of the cuff should be 1 inch above the crease of the elbow. . Ideally, take 3 measurements at one sitting and record the average.

## 2016-11-08 NOTE — Progress Notes (Signed)
Patient ID: [name]                 DOB: 1934/06/12                      MRN: [id number]     HPI: [name] is a 83 y.o. female patient of Dr. Tamala Julian referred to HTN clinic for medication management .  PMH of moderate CAD, HTN, HLD, and premature atrial contractions. Renal ultrasound negative for renal artery stenosis. Pt's afternoon and evening dose of hydralazine was increased to 100mg  at last OV to help target isolated elevated BP readings in the afternoon.  Has had a "chill" in her back for the last few months not sure if medication related. Does get dizzy sometimes in the morning.    Current HTN meds:amlodipine 10 mg, valsartan 320 mg, carvedilol 12.5 BID, hydralazine 25mg  AM and 100mg  afternoon and PM  Previously tried:spironolactone 12.5 mg, HCTZ (will avoid thiazides d/t CrCl of 32mL/min) BP goal: <140/90 mmHg  Family History: Heart disease and HLD (mother), cancer/breast cancer (sister)  Social History: Never smoker, denies alcohol and illicit drug use  Diet:Patient endorses reading labels, avoiding canned foods, and using salt substitutes. She reports eating lean meats and vegetable. She drinks iced tea, but not coffee. Cut out chips from her diet.  Exercise:She goes to [employer] 1-2 week and stays active withhousekeeping.   Home BP readings: Morning pressures mostly 110s/50s-60s - she does have occasional ielevated morning pressures to 0000000 systolic.  Afternoon pressures mostly 140s-160s/50-60s Evening pressures 170s-190s/60s  These pressures are about 2 hours after taking her medications   Wt Readings from Last 3 Encounters:  09/17/16 132 lb (59.9 kg)  07/22/16 129 lb 9.6 oz (58.8 kg)  07/15/16 129 lb (58.5 kg)   BP Readings from Last 3 Encounters:  11/08/16 (!) 168/52  10/08/16 (!) 172/60  09/17/16 138/64   Pulse Readings from Last 3 Encounters:  10/08/16 65  09/17/16 62  08/19/16 66    Renal function: CrCl cannot be  calculated (Patient's most recent lab result is older than the maximum 21 days allowed.).  Past Medical History:  Diagnosis Date  . 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 Breast cancer ([place]) 2010  . CAD (coronary artery disease)   . Dysuria   . Frequency of urination   . GERD (gastroesophageal reflux disease)   . Heart murmur   . History of breast cancer ONCOLOGIST-- DR Benay Spice   DX 2010--  RIGHT BREAST DCIS  HIGH GRADE S/P LUMPECTOMY W/ SLN BX--  NO RECURRENCE  . 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 History of skin cancer    excision basal cell  . Hyperlipidemia   . Hypertension   . Kidney stones 7/14  . Nocturia   . 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 Renal stones 2014  . Shingles   . Ureteral calculi    bilateral  . Urgency of urination     Current Outpatient Prescriptions on File Prior to Visit  Medication Sig Dispense Refill  . amLODipine (NORVASC) 10 MG tablet Take 1 tablet (10 mg total) by mouth daily. 90 tablet 3  . aspirin 81 MG tablet Take 81 mg by mouth daily.    . Biotin (  BIOTIN MAXIMUM STRENGTH) 10 MG TABS Take 1 tablet by mouth daily.    . Calcium Carbonate-Vitamin D (CALTRATE 600+D PO) Take 1 tablet by mouth 2 (two) times daily.    . carvedilol (COREG) 12.5 MG tablet TAKE 1 TABLET BY MOUTH 2 TIMES DAILY WITH A MEAL. 180 tablet 2  . Cholecalciferol (VITAMIN D) 2000 UNITS tablet Take 2,000 Units by mouth daily.    . hydrALAZINE (APRESOLINE) 100 MG tablet Take 1 tablet by mouth 2 times daily (in the afternoon and evening) 180 tablet 3  . hydrALAZINE (APRESOLINE) 25 MG tablet Take 1 tablet by mouth daily (in the morning) 90 tablet 3  . lidocaine (XYLOCAINE) 2 % jelly Apply 1 application topically daily as needed (for  pain).    .  Multiple Vitamins-Minerals (MULTIVITAMIN PO) Take 1 tablet by mouth daily.    Marland Kitchen PROCTOZONE-HC 2.5 % rectal cream PLACE 1 APPLICATION RECTALLY 2 TIMES A DAY AS NEEDED FOR HEMORRHOIDS. 60 g 11  . valsartan (DIOVAN) 320 MG tablet Take 320 mg by mouth every morning.    . colestipol (COLESTID) 1 g tablet Take 2 g by mouth 2 (two) times daily.     No current facility-administered medications on file prior to visit.     No Known Allergies  Blood pressure (!) 168/52, last menstrual period 10/07/1974.   Assessment/Plan: Hypertension: Systolic BP remains elevated in the afternoon and evening with occasional elevations in the morning. Her pressure are normally lower in the morning and progressively increase throughout the day. It has been difficult to control her pressures as she has low-normal measurements in the morning, but drastically elevated, especially in the evening. I am hesitant to add another medication to her profile as at this point we would be limited to older agents with more side effects. We will retime some of her medications to see if this can control her pressures. Will move her amlodipine to lunch time instead of the evening. Continue to monitor pressures. Follow up in hypertension clinic in 1 month.    Thank you, [name], [facility]  11/08/2016 3:07 PM

## 2016-12-02 DIAGNOSIS — E538 Deficiency of other specified B group vitamins: Secondary | ICD-10-CM | POA: Diagnosis not present

## 2016-12-02 DIAGNOSIS — D509 Iron deficiency anemia, unspecified: Secondary | ICD-10-CM | POA: Diagnosis not present

## 2016-12-06 ENCOUNTER — Ambulatory Visit (INDEPENDENT_AMBULATORY_CARE_PROVIDER_SITE_OTHER): Payer: Medicare Other | Admitting: Pharmacist

## 2016-12-06 VITALS — BP 148/56 | HR 74

## 2016-12-06 DIAGNOSIS — I10 Essential (primary) hypertension: Secondary | ICD-10-CM

## 2016-12-06 MED ORDER — CARVEDILOL 25 MG PO TABS: 25.0000 mg | ORAL_TABLET | Freq: Two times a day (BID) | ORAL | 0 refills | Status: DC

## 2016-12-06 NOTE — Patient Instructions (Addendum)
Return for a a follow up appointment in 4 weeks.   Your blood pressure today is 148/56.   Check your blood pressure at home daily (if able) and keep record of the readings.  Take your BP meds as follows: Continue Amlodipine 10 mg at lunch time, valsartan 320 mg AM, hydralazine 25mg  in the morning and 100mg  in the afternoon and the evening. Increase to carvedilol 25 mg twice daily.   Bring all of your meds, your BP cuff and your record of home blood pressures to your next appointment.  Exercise as you're able, try to walk approximately 30 minutes per day.  Keep salt intake to a minimum, especially watch canned and prepared boxed foods.  Eat more fresh fruits and vegetables and fewer canned items.  Avoid eating in fast food restaurants.    HOW TO TAKE YOUR BLOOD PRESSURE: . Rest 5 minutes before taking your blood pressure. .  Don't smoke or drink caffeinated beverages for at least 30 minutes before. . Take your blood pressure before (not after) you eat. . Sit comfortably with your back supported and both feet on the floor (don't cross your legs). . Elevate your arm to heart level on a table or a desk. . Use the proper sized cuff. It should fit smoothly and snugly around your bare upper arm. There should be enough room to slip a fingertip under the cuff. The bottom edge of the cuff should be 1 inch above the crease of the elbow. . Ideally, take 3 measurements at one sitting and record the average.

## 2016-12-06 NOTE — Progress Notes (Signed)
Patient ID: [name]                 DOB: 1933-12-11                      MRN: [id number]     HPI: [name] is a 82 y.o. female patient of Dr. Tamala Julian referred to HTN clinic for medication management. PMH of moderate CAD, HTN, HLD, and premature atrial contractions. Renal ultrasound negative for renal artery stenosis. Pt's afternoon and eveningdose of hydralazine was increased to 100mg  at last OV to help target isolated elevated BP readings in the afternoon. At her last visit we adjusted her amlodipine to be taken at lunch time.     Patient is presenting today frustrated with being unable to achieve her blood pressure goals.   She notes that previously, she had some episodes of dizziness when her blood pressures were lower in the morning. She drinks fluids appropriately.   Current HTN meds:amlodipine 10 mg at lunch time, valsartan 320 mg, carvedilol 12.5 BID, hydralazine 25mg  AM and 100mg  afternoon and PM   Previously tried:Spironolactone 12.5 mg, HCTZ (will avoid thiazides d/t CrCl of 77mL/min)  BP goal: <140/90 mmHg  Family History: Heart disease and HLD (mother), cancer/breast cancer (sister)  Social History: Never smoker, denies alcohol and illicit drug use  Diet:Patient endorses reading labels, avoiding canned foods, and using salt substitutes. She reports eating lean meats and vegetable. She drinks iced tea, but not coffee. Cut out chips from her diet.  Exercise:She goes to [employer] 1-2 week and stays active withhousekeeping.   Home BP readings: Mornings: 110-130/50-60s Afternoon: 130-170/50-70s Evenings: 150-180/50-70s Overall, afternoon and evening doses are more similar than they were previously.   Wt Readings from Last 3 Encounters:  09/17/16 132 lb (59.9 kg)  07/22/16 129 lb 9.6 oz (58.8 kg)  07/15/16 129 lb (58.5 kg)   BP Readings from Last 3 Encounters:  12/06/16 (!) 148/56  11/08/16 (!) 168/52  10/08/16 (!) 172/60   Pulse Readings  from Last 3 Encounters:  12/06/16 74  10/08/16 65  09/17/16 62    Renal function: CrCl cannot be calculated (Patient's most recent lab result is older than the maximum 21 days allowed.).  Past Medical History:  Diagnosis Date  . 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 Breast cancer ([place]) 2010  . CAD (coronary artery disease)   . Dysuria   . Frequency of urination   . GERD (gastroesophageal reflux disease)   . Heart murmur   . History of breast cancer ONCOLOGIST-- DR Benay Spice   DX 2010--  RIGHT BREAST DCIS  HIGH GRADE S/P LUMPECTOMY W/ SLN BX--  NO RECURRENCE  . 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 History of skin cancer    excision basal cell  . Hyperlipidemia   . Hypertension   . Kidney stones 7/14  . Nocturia   . 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 Renal stones 2014  . Shingles   . Ureteral calculi    bilateral  . Urgency of urination     Current Outpatient Prescriptions on File Prior to Visit  Medication Sig Dispense Refill  . amLODipine (NORVASC) 10 MG tablet Take 1 tablet (10 mg total) by mouth daily. 90 tablet  3  . aspirin 81 MG tablet Take 81 mg by mouth daily.    . Biotin (BIOTIN MAXIMUM STRENGTH) 10 MG TABS Take 1 tablet by mouth daily.    . Calcium Carbonate-Vitamin D (CALTRATE 600+D PO) Take 1 tablet by mouth 2 (two) times daily.    . Cholecalciferol (VITAMIN D) 2000 UNITS tablet Take 2,000 Units by mouth daily.    . colestipol (COLESTID) 1 g tablet Take 2 g by mouth 2 (two) times daily.    . hydrALAZINE (APRESOLINE) 100 MG tablet Take 1 tablet by mouth 2 times daily (in the afternoon and evening) 180 tablet 3  . hydrALAZINE (APRESOLINE) 25 MG tablet Take 1 tablet by mouth daily (in the morning) 90 tablet 3  . lidocaine  (XYLOCAINE) 2 % jelly Apply 1 application topically daily as needed (for  pain).    . Multiple Vitamins-Minerals (MULTIVITAMIN PO) Take 1 tablet by mouth daily.    Marland Kitchen PROCTOZONE-HC 2.5 % rectal cream PLACE 1 APPLICATION RECTALLY 2 TIMES A DAY AS NEEDED FOR HEMORRHOIDS. 60 g 11  . valsartan (DIOVAN) 320 MG tablet Take 320 mg by mouth every morning.     No current facility-administered medications on file prior to visit.     No Known Allergies  Blood pressure (!) 148/56, pulse 74, last menstrual period 10/07/1974, SpO2 99 %.   Assessment/Plan: Hypertension: Patient's blood pressures are still > goal of 140/90, mostly in the afternoons and evenings.  1. Increase carvedilol to 25 mg BID 2. Continue amlodipine 10 mg at lunch time, valsartan 320 mg AM, hydralazine 25mg  AM and 100mg  afternoon  3. We discussed possible future options, including increasing the AM hydralazine, restarting spironolactone, or clonidine in the evening to target the most elevated blood pressures.   Follow up appointment scheduled in 4 weeks.   Patient was seen with Catie Darnelle Maffucci, PharmD Candidate   Thank you, Lelan Pons. Patterson Hammersmith, [facility]  12/06/2016 2:31 PM

## 2017-01-01 DIAGNOSIS — E538 Deficiency of other specified B group vitamins: Secondary | ICD-10-CM | POA: Diagnosis not present

## 2017-01-03 ENCOUNTER — Encounter: Payer: Self-pay | Admitting: Pharmacist

## 2017-01-03 ENCOUNTER — Ambulatory Visit (INDEPENDENT_AMBULATORY_CARE_PROVIDER_SITE_OTHER): Payer: Medicare Other | Admitting: Pharmacist

## 2017-01-03 VITALS — BP 146/60 | HR 66

## 2017-01-03 DIAGNOSIS — I10 Essential (primary) hypertension: Secondary | ICD-10-CM

## 2017-01-03 MED ORDER — CLONIDINE HCL 0.1 MG PO TABS: 0.1000 mg | ORAL_TABLET | Freq: Two times a day (BID) | ORAL | 11 refills | Status: DC

## 2017-01-03 NOTE — Progress Notes (Signed)
Patient ID: [name]                 DOB: 07-Dec-1933                      MRN: [id number]     HPI: [name] is an 83 y.o. female patient of Dr. Tamala Julian referred to HTN clinic for medication management. PMH of moderate CAD, HTN, HLD, and premature atrial contractions. Renal ultrasound negative for renal artery stenosis. At last HTN OV, home BP readings were elevated throughout the day. Carvedilol was increased to 25 mg BID.   Pt reports feeling well overall and is compliant with her medications. She denies dizziness, headache, and blurred vision. She checks her BP at home 3 times a day, always 2-3 hours after taking her BP medications. Her Readings continue to trend up consistently throughout the day. Morning readings (10-11a,) range 542-706C systolic. Afternoon readings (2-3pm) range 376-283T systolic. Evening readings (10-11pm) range 517-616W systolic. Afternoon and evening readings have seen some improvement since titrating up hydralazine and carvedilol dosing.  Current HTN meds: Amlodipine 10 mg - lunch time Valsartan 320 mg - morning Carvedilol 25 BID Hydralazine 25mg  AM and 100mg  afternoon and PM   Previously tried:Spironolactone 12.5 mg, HCTZ (will avoid diuretics due to CrCl of ~7mL/min)  BP goal: <140/90 mmHg  Family History: Heart disease and HLD (mother), cancer/breast cancer (sister)  Social History: Never smoker, denies alcohol and illicit drug use  Diet:Patient endorses reading labels, avoiding canned foods, and using salt substitutes. She reports eating lean meats and vegetable. She drinks iced tea, but not coffee. Cut out chips from her diet.  Exercise:She goes to [employer] 1-2 week and stays active withhousekeeping.   Wt Readings from Last 3 Encounters:  09/17/16 132 lb (59.9 kg)  07/22/16 129 lb 9.6 oz (58.8 kg)  07/15/16 129 lb (58.5 kg)   BP Readings from Last 3 Encounters:  12/06/16 (!) 148/56  11/08/16 (!) 168/52  10/08/16 (!)  172/60   Pulse Readings from Last 3 Encounters:  12/06/16 74  10/08/16 65  09/17/16 62    Renal function: CrCl cannot be calculated (Patient's most recent lab result is older than the maximum 21 days allowed.).  Past Medical History:  Diagnosis Date  . 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 Breast cancer ([place]) 2010  . CAD (coronary artery disease)   . Dysuria   . Frequency of urination   . GERD (gastroesophageal reflux disease)   . Heart murmur   . History of breast cancer ONCOLOGIST-- DR Benay Spice   DX 2010--  RIGHT BREAST DCIS  HIGH GRADE S/P LUMPECTOMY W/ SLN BX--  NO RECURRENCE  . 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 History of skin cancer    excision basal cell  . Hyperlipidemia   . Hypertension   . Kidney stones 7/14  . Nocturia   . 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 Renal stones 2014  . Shingles   . Ureteral calculi    bilateral  . Urgency of urination     Current Outpatient Prescriptions on File Prior to Visit  Medication Sig Dispense Refill  . amLODipine (NORVASC) 10 MG tablet Take 1 tablet (10 mg total) by mouth daily. 90 tablet  3  . aspirin 81 MG tablet Take 81 mg by mouth daily.    . Biotin (BIOTIN MAXIMUM STRENGTH) 10 MG TABS Take 1 tablet by mouth daily.    . Calcium Carbonate-Vitamin D (CALTRATE 600+D PO) Take 1 tablet by mouth 2 (two) times daily.    . carvedilol (COREG) 25 MG tablet Take 1 tablet (25 mg total) by mouth 2 (two) times daily with a meal. 60 tablet 0  . Cholecalciferol (VITAMIN D) 2000 UNITS tablet Take 2,000 Units by mouth daily.    . colestipol (COLESTID) 1 g tablet Take 2 g by mouth 2 (two) times daily.    . hydrALAZINE (APRESOLINE) 100 MG tablet Take 1 tablet by mouth 2 times daily (in the afternoon and  evening) 180 tablet 3  . hydrALAZINE (APRESOLINE) 25 MG tablet Take 1 tablet by mouth daily (in the morning) 90 tablet 3  . lidocaine (XYLOCAINE) 2 % jelly Apply 1 application topically daily as needed (for  pain).    . Multiple Vitamins-Minerals (MULTIVITAMIN PO) Take 1 tablet by mouth daily.    Marland Kitchen PROCTOZONE-HC 2.5 % rectal cream PLACE 1 APPLICATION RECTALLY 2 TIMES A DAY AS NEEDED FOR HEMORRHOIDS. 60 g 11  . valsartan (DIOVAN) 320 MG tablet Take 320 mg by mouth every morning.     No current facility-administered medications on file prior to visit.     No Known Allergies  Last menstrual period 10/07/1974.   Assessment/Plan:  1. Hypertension: Morning blood pressure readings remain stable at goal <140/43mmHg while afternoon and evening BP readings continue to trend up. Will start low dose clonidine 0.1mg  BID. Pt will take first dose at lunch and second dose at supper. Hopefully this will help to target higher afternoon and evening BP readings. Discussed side effects that are common with clonidine therapy including dizziness, sedation, and dry mouth. Pt will call clinic if she develops bothersome side effects. Will continue max dose amlodipine, valsartan, carvedilol, and hydralazine (lower morning dose d/t readings at goal at this time). Avoiding thiazides and spironolactone due to poor renal function. Pt has f/u with Dr Tamala Julian in 2 weeks. She will follow up in HTN clinic 3 weeks after this appt.    Patient was seen with Catie Darnelle Maffucci, PharmD Candidate   Zeev Deakins E. Armando Bukhari, PharmD, CPP, [facility] [address] [place], [place] [address] Phone: [phone]; Fax: [phone] 01/03/2017 8:38 AM

## 2017-01-03 NOTE — Patient Instructions (Signed)
Start taking clonidine 0.1mg  twice daily - take your first dose around noon and your second lunch at supper. This should help lower your afternoon and evening blood pressure readings.  Look for any noticeable signs of dizziness, fatigue, or dry mouth. These are common side effects of clonidine.  Continue your other medications and continue to monitor your blood pressure at home as you have been.  Follow up with Dr Tamala Julian in 2 weeks and in the blood pressure clinic in 4 weeks.

## 2017-01-09 DIAGNOSIS — E7800 Pure hypercholesterolemia, unspecified: Secondary | ICD-10-CM | POA: Diagnosis not present

## 2017-01-09 DIAGNOSIS — K649 Unspecified hemorrhoids: Secondary | ICD-10-CM | POA: Diagnosis not present

## 2017-01-09 DIAGNOSIS — I10 Essential (primary) hypertension: Secondary | ICD-10-CM | POA: Diagnosis not present

## 2017-01-09 DIAGNOSIS — Z0000 Encounter for general adult medical examination without abnormal findings: Secondary | ICD-10-CM | POA: Diagnosis not present

## 2017-01-09 DIAGNOSIS — I2510 Atherosclerotic heart disease of native coronary artery without angina pectoris: Secondary | ICD-10-CM | POA: Diagnosis not present

## 2017-01-21 ENCOUNTER — Ambulatory Visit (INDEPENDENT_AMBULATORY_CARE_PROVIDER_SITE_OTHER): Payer: Medicare Other | Admitting: Interventional Cardiology

## 2017-01-21 ENCOUNTER — Encounter: Payer: Self-pay | Admitting: Interventional Cardiology

## 2017-01-21 VITALS — BP 136/60 | HR 63 | Ht 67.0 in | Wt 129.8 lb

## 2017-01-21 DIAGNOSIS — I2510 Atherosclerotic heart disease of native coronary artery without angina pectoris: Secondary | ICD-10-CM | POA: Diagnosis not present

## 2017-01-21 DIAGNOSIS — R0989 Other specified symptoms and signs involving the circulatory and respiratory systems: Secondary | ICD-10-CM | POA: Diagnosis not present

## 2017-01-21 DIAGNOSIS — E7849 Other hyperlipidemia: Secondary | ICD-10-CM

## 2017-01-21 DIAGNOSIS — I10 Essential (primary) hypertension: Secondary | ICD-10-CM | POA: Diagnosis not present

## 2017-01-21 DIAGNOSIS — E784 Other hyperlipidemia: Secondary | ICD-10-CM

## 2017-01-21 MED ORDER — HYDRALAZINE HCL 50 MG PO TABS
50.0000 mg | ORAL_TABLET | Freq: Three times a day (TID) | ORAL | 3 refills | Status: DC
Start: 2017-01-21 — End: 2017-10-31

## 2017-01-21 NOTE — Patient Instructions (Signed)
Medication Instructions:  1) DECREASE your Hydralazine to 50mg  three times daily.  Monitor blood pressure and if ok may decrease to 50mg  twice daily.  If blood pressure remains well may decrease to 25mg  twice daily. If blood pressure remains 140/90 or less after decreasing doses, you may discontinue.    Labwork: None  Testing/Procedures: Your physician has requested that you have a carotid duplex. This test is an ultrasound of the carotid arteries in your neck. It looks at blood flow through these arteries that supply the brain with blood. Allow one hour for this exam. There are no restrictions or special instructions.   Follow-Up: Your physician wants you to follow-up in: 1 year with Dr. Tamala Julian. You will receive a reminder letter in the mail two months in advance. If you don't receive a letter, please call our office to schedule the follow-up appointment.   Any Other Special Instructions Will Be Listed Below (If Applicable).     If you need a refill on your cardiac medications before your next appointment, please call your pharmacy.

## 2017-01-21 NOTE — Progress Notes (Signed)
Cardiology Office Note    Date:  01/21/2017   ID:  [name], DOB 09/18/1934, MRN [id number]  PCP:  Garlan Fair, MD  Cardiologist: Sinclair Grooms, MD   Chief Complaint  Patient presents with  . Follow-up  . Coronary Artery Disease    History of Present Illness:  [name] is a 83 y.o. female  with moderate CAD, hypertension, hyperlipidemia, and premature atrial contractions.   Doing well. Blood pressure clinic has helped to get the levels under excellent control. She is on multiple medications. Clonidine seemed to help more than anything. She has no angina.  Past Medical History:  Diagnosis Date  . 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 Breast cancer ([place]) 2010  . CAD (coronary artery disease)   . Dysuria   . Frequency of urination   . GERD (gastroesophageal reflux disease)   . Heart murmur   . History of breast cancer ONCOLOGIST-- DR Benay Spice   DX 2010--  RIGHT BREAST DCIS  HIGH GRADE S/P LUMPECTOMY W/ SLN BX--  NO RECURRENCE  . 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 History of skin cancer    excision basal cell  . Hyperlipidemia   . Hypertension   . Kidney stones 7/14  . Nocturia   . 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 Renal stones 2014  . Shingles   . Ureteral calculi    bilateral  . Urgency of urination     Past Surgical History:  Procedure Laterality Date  . APPENDECTOMY    . BREAST LUMPECTOMY Right 04-13-2009   W/ SLN BX  . CARDIAC CATHETERIZATION  11-11-2005 DR Daneen Schick   MODERATE LAD DISEASE/ NORMAL LVF/ EF 65-75%  . CATARACT EXTRACTION W/ INTRAOCULAR LENS IMPLANT Left 04-26-2013  . CHOLECYSTECTOMY  1990  . CYSTOSCOPY W/ URETERAL STENT PLACEMENT Bilateral 05/07/2013   Procedure: CYSTOSCOPY WITH  STENT REPLACEMENTS;  Surgeon: Alexis Frock, MD;  Location[facility];  Service: Urology;  Laterality: Bilateral;  . CYSTOSCOPY WITH BIOPSY N/A 04/07/2013   Procedure: CYSTOSCOPY WITH BIOPSY and fulgeration;  Surgeon: Alexis Frock, MD;  Location: [facility];  Service: Urology;  Laterality: N/A;  . CYSTOSCOPY WITH RETROGRADE PYELOGRAM, URETEROSCOPY AND STENT PLACEMENT Bilateral 05/07/2013   Procedure: CYSTOSCOPY WITH RETROGRADE PYELOGRAM, URETEROSCOPY ;  Surgeon: Alexis Frock, MD;  Location: [facility];  Service: Urology;  Laterality: Bilateral;  . CYSTOSCOPY WITH STENT PLACEMENT Bilateral 04/07/2013   Procedure: CYSTOSCOPY WITH STENT PLACEMENT;  Surgeon: Alexis Frock, MD;  Location: [facility];  Service: Urology;  Laterality: Bilateral;  . DX LAPAROSCOPY W/ PERITONEAL AND OMENTAL BX'S AND WASHINGS  02-16-2008  . EXCISION RIGHT BREAST MASS  02-10-2002  . EXP. LAP. EXTENSIVE ADHESIOLYSIS/ RESECTION TERMINAL ILEUM , ASCENDING AND DESCENDING COLON WITH CREATION ILEOSTOMY AND MUCOUS FISTULA  02-19-2008   PERFERATION AND ABD. CANCER--  TAKEDOWN ILEOSTOMY 08-23-2008  . TOTAL ABDOMINAL HYSTERECTOMY W/ BILATERAL SALPINGOOPHORECTOMY  1976  . TOTAL ABDOMINAL HYSTERECTOMY W/ BILATERAL SALPINGOOPHORECTOMY  1976  . VULVAR LESION REMOVAL N/A 08/22/2014   Procedure: EXCISION OF VULVAR CYST  ;  Surgeon: Lyman Speller, MD;  Location: [facility];  Service: Gynecology;  Laterality: N/A;  . WOUND DEBRIDEMENT  05/28/2012   Procedure: DEBRIDEMENT ABDOMINAL WOUND;  Surgeon: Haywood Lasso,  MD;  Location: [facility];  Service: General;  Laterality: N/A;  excision chronic wound abdominal wall    Current Medications: Outpatient Medications Prior to Visit  Medication Sig Dispense Refill  . amLODipine (NORVASC) 10 MG tablet Take 1 tablet (10 mg total) by mouth daily. 90 tablet 3  . aspirin 81 MG tablet Take 81 mg by mouth daily.    . Biotin (BIOTIN MAXIMUM STRENGTH) 10 MG  TABS Take 1 tablet by mouth daily.    . Calcium Carbonate-Vitamin D (CALTRATE 600+D PO) Take 1 tablet by mouth 2 (two) times daily.    . carvedilol (COREG) 25 MG tablet Take 1 tablet (25 mg total) by mouth 2 (two) times daily with a meal. 60 tablet 0  . Cholecalciferol (VITAMIN D) 2000 UNITS tablet Take 2,000 Units by mouth daily.    . cloNIDine (CATAPRES) 0.1 MG tablet Take 1 tablet (0.1 mg total) by mouth 2 (two) times daily. 60 tablet 11  . colestipol (COLESTID) 1 g tablet Take 2 g by mouth 2 (two) times daily.    Marland Kitchen lidocaine (XYLOCAINE) 2 % jelly Apply 1 application topically daily as needed (for  pain).    . Multiple Vitamins-Minerals (MULTIVITAMIN PO) Take 1 tablet by mouth daily.    Marland Kitchen PROCTOZONE-HC 2.5 % rectal cream PLACE 1 APPLICATION RECTALLY 2 TIMES A DAY AS NEEDED FOR HEMORRHOIDS. 60 g 11  . valsartan (DIOVAN) 320 MG tablet Take 320 mg by mouth every morning.    . hydrALAZINE (APRESOLINE) 100 MG tablet Take 1 tablet by mouth 2 times daily (in the afternoon and evening) 180 tablet 3  . hydrALAZINE (APRESOLINE) 25 MG tablet Take 1 tablet by mouth daily (in the morning) 90 tablet 3   No facility-administered medications prior to visit.      Allergies:   Patient has no known allergies.   Social History   Social History  . Marital status: Married    Spouse name: N/A  . Number of children: N/A  . Years of education: N/A   Social History Main Topics  . Smoking status: Never Smoker  . Smokeless tobacco: Never Used  . Alcohol use No  . Drug use: No  . Sexual activity: No     Comment: Hysterectomy   Other Topics Concern  . None   Social History Narrative  . None     Family History:  The patient's family history includes Breast cancer in her sister; Cancer in her sister; Heart disease in her mother; Hyperlipidemia in her mother.   ROS:   Please see the history of present illness.    No complaints.  All other systems reviewed and are negative.   PHYSICAL EXAM:   VS:   BP 136/60 (BP Location: Left Arm)   Pulse 63   Ht 5\' 7"  (1.702 m)   Wt 129 lb 12.8 oz (58.9 kg)   LMP 10/07/1974   BMI 20.33 kg/m    GEN: Well nourished, well developed, in no acute distress  HEENT: normal  Neck:Loud left carotid bruit. Cardiac: RRR; 2/6 systolic murmur at right upper sternal border. No rubs, or gallops,no edema  Respiratory:  clear to auscultation bilaterally, normal work of breathing GI: soft, nontender, nondistended, + BS MS: no deformity or atrophy  Skin: warm and dry, no rash Neuro:  Alert and Oriented x 3, Strength and sensation are intact Psych: euthymic mood, full affect  Wt Readings from Last 3 Encounters:  01/21/17 129 lb 12.8 oz (58.9 kg)  09/17/16 132 lb (59.9 kg)  07/22/16 129 lb 9.6 oz (58.8 kg)      Studies/Labs Reviewed:   EKG:  EKG  is not performed.  Recent Labs: 08/01/2016: BUN 38; Creat 1.44; Potassium 4.5; Sodium 139   Lipid Panel    Component Value Date/Time   CHOL  02/29/2008 0515    61        ATP III CLASSIFICATION:  <200     mg/dL   Desirable  200-239  mg/dL   Borderline High  >=240    mg/dL   High   TRIG 110 02/29/2008 0515    Additional studies/ records that were reviewed today include:  No new functional or imaging data    ASSESSMENT:    1. Left carotid bruit   2. Coronary artery disease involving native coronary artery of native heart without angina pectoris   3. Essential hypertension   4. Other hyperlipidemia      PLAN:  In order of problems listed above:  1. Significant intensity. Newly identified. Bilateral carotid Doppler and duplex to rule out significant stenosis. 2. Stable without angina pectoris or nitroglycerin use 3. Markedly improved blood pressure. I will like to discontinue hydralazine and possible. We will start by weaning her current regimen to 50 mg 3 times a day. She can then decrease to 50 mg twice daily. If blood pressure remains okay she can then decrease to 25 mg twice daily. She will  then be allowed to discontinue the medication if we are able to maintain less than 140/90 mmHg almost recordings. 4. No discussion at today's office visit.  Call with blood pressure recordings. Continue follow-up is planned in the blood pressure clinic. Clinical follow-up with me in 9-12 months.  Medication Adjustments/Labs and Tests Ordered: Current medicines are reviewed at length with the patient today.  Concerns regarding medicines are outlined above.  Medication changes, Labs and Tests ordered today are listed in the Patient Instructions below. Patient Instructions  Medication Instructions:  1) DECREASE your Hydralazine to 50mg  three times daily.  Monitor blood pressure and if ok may decrease to 50mg  twice daily.  If blood pressure remains well may decrease to 25mg  twice daily. If blood pressure remains 140/90 or less after decreasing doses, you may discontinue.    Labwork: None  Testing/Procedures: Your physician has requested that you have a carotid duplex. This test is an ultrasound of the carotid arteries in your neck. It looks at blood flow through these arteries that supply the brain with blood. Allow one hour for this exam. There are no restrictions or special instructions.   Follow-Up: Your physician wants you to follow-up in: 1 year with Dr. Tamala Julian. You will receive a reminder letter in the mail two months in advance. If you don't receive a letter, please call our office to schedule the follow-up appointment.   Any Other Special Instructions Will Be Listed Below (If Applicable).     If you need a refill on your cardiac medications before your next appointment, please call your pharmacy.      Signed, Sinclair Grooms, MD  01/21/2017 4:16 PM    [facility] [place], [place], [place]  [address] Phone: [phone]; Fax: [phone]

## 2017-02-03 ENCOUNTER — Other Ambulatory Visit: Payer: Self-pay | Admitting: Interventional Cardiology

## 2017-02-05 DIAGNOSIS — E538 Deficiency of other specified B group vitamins: Secondary | ICD-10-CM | POA: Diagnosis not present

## 2017-02-07 ENCOUNTER — Ambulatory Visit (HOSPITAL_COMMUNITY)
Admission: RE | Admit: 2017-02-07 | Discharge: 2017-02-07 | Disposition: A | Discharge status: Home / Self Care | Payer: Medicare Other | Source: Ambulatory Visit | Attending: Cardiovascular Disease | Admitting: Cardiovascular Disease

## 2017-02-07 DIAGNOSIS — R0989 Other specified symptoms and signs involving the circulatory and respiratory systems: Secondary | ICD-10-CM | POA: Insufficient documentation

## 2017-02-07 DIAGNOSIS — I6523 Occlusion and stenosis of bilateral carotid arteries: Secondary | ICD-10-CM | POA: Diagnosis not present

## 2017-02-11 ENCOUNTER — Ambulatory Visit (INDEPENDENT_AMBULATORY_CARE_PROVIDER_SITE_OTHER): Payer: Medicare Other | Admitting: Pharmacist

## 2017-02-11 VITALS — BP 138/62 | HR 64

## 2017-02-11 DIAGNOSIS — I10 Essential (primary) hypertension: Secondary | ICD-10-CM | POA: Diagnosis not present

## 2017-02-11 DIAGNOSIS — I2510 Atherosclerotic heart disease of native coronary artery without angina pectoris: Secondary | ICD-10-CM

## 2017-02-11 NOTE — Patient Instructions (Addendum)
Return for a follow up appointment in 6 weeks  Check your blood pressure at home daily (if able) and keep record of the readings.  Take your BP meds as follows: Continue hydralazine 50mg  twice daily for 2 weeks. If pressure remains stable stop hydralazine.   Bring all of your meds, your BP cuff and your record of home blood pressures to your next appointment.  Exercise as you're able, try to walk approximately 30 minutes per day.  Keep salt intake to a minimum, especially watch canned and prepared boxed foods.  Eat more fresh fruits and vegetables and fewer canned items.  Avoid eating in fast food restaurants.    HOW TO TAKE YOUR BLOOD PRESSURE: . Rest 5 minutes before taking your blood pressure. .  Don't smoke or drink caffeinated beverages for at least 30 minutes before. . Take your blood pressure before (not after) you eat. . Sit comfortably with your back supported and both feet on the floor (don't cross your legs). . Elevate your arm to heart level on a table or a desk. . Use the proper sized cuff. It should fit smoothly and snugly around your bare upper arm. There should be enough room to slip a fingertip under the cuff. The bottom edge of the cuff should be 1 inch above the crease of the elbow. . Ideally, take 3 measurements at one sitting and record the average.

## 2017-02-11 NOTE — Progress Notes (Signed)
Patient ID: [name]                 DOB: [birth year]                      MRN: [id number]     HPI: [name] is a 83 y.o. female patient of  Dr. Arne Powell to HTN clinic for medication management. PMH of moderate CAD, HTN, HLD, and premature atrial contractions. Renal ultrasound negative for renal artery stenosis. At last HTN OV, home BP readings were elevated throughout the day. At her most recent visit with HTN clinic she was started on clonidine 0.1mg  BID with lunch and dinner. She has since seen Dr. Tamala Powell since and reported much better BP control with clonidine dosing. Her hydralazine dose was cut with intention of discontinuing therapy if pressures remained controlled.   Patient presents today for follow up. She reports that her pressures have been doing well with clonidine. She has had some dry mouth, but states that it is tolerable. She is wanting to get off all medications if possible, but is encouraged with weaning/discontinuation of hydralazine.   She cut her dose of hydralazine to 50mg  BID from TID 3 days ago.   Current HTN meds:  Amlodipine 10 mg - lunch time Valsartan 320 mg - morning Carvedilol 25 BID Hydralazine 50mg  BID - goal to wean completely - prefers not to go to 25mg  BID as hard to cut tablets and does not want prescription if just short term.  Clonidine 0.1mg  BID - lunch and dinner  Previously tried:Spironolactone 12.5 mg, HCTZ (will avoid diuretics due to CrCl of ~29mL/min)  BP goal: <140/90 mmHg  Family History: Heart disease and HLD (mother), cancer/breast cancer (sister)  Social History: Never smoker, denies alcohol and illicit drug use  Diet:Patient endorses reading labels, avoiding canned foods, and using salt substitutes. She reports eating lean meats and vegetable. She drinks iced tea, but not coffee. Cut out chips from her diet.  Exercise:She goes to [employer] 1-2 week and stays active withhousekeeping.    BP  measurement: Her presures have been running 109-152/49-66 - mostly 120s/60s.   Wt Readings from Last 3 Encounters:  01/21/17 129 lb 12.8 oz (58.9 kg)  09/17/16 132 lb (59.9 kg)  07/22/16 129 lb 9.6 oz (58.8 kg)   BP Readings from Last 3 Encounters:  02/11/17 138/62  01/21/17 136/60  01/03/17 (!) 146/60   Pulse Readings from Last 3 Encounters:  02/11/17 64  01/21/17 63  01/03/17 66    Renal function: CrCl cannot be calculated (Patient's most recent lab result is older than the maximum 21 days allowed.).  Past Medical History:  Diagnosis Date  . 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 Breast cancer ([place]) 2010  . CAD (coronary artery disease)   . Dysuria   . Frequency of urination   . GERD (gastroesophageal reflux disease)   . Heart murmur   . History of breast cancer ONCOLOGIST-- DR [name]   DX 2010--  RIGHT BREAST DCIS  HIGH GRADE S/P LUMPECTOMY W/ SLN BX--  NO RECURRENCE  . 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 History of skin cancer    excision basal cell  . Hyperlipidemia   . Hypertension   . Kidney stones 7/14  . Nocturia   . 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 Renal stones 2014  . Shingles   . Ureteral calculi    bilateral  . Urgency of urination     Current Outpatient Prescriptions on File Prior to Visit  Medication Sig Dispense Refill  . amLODipine (NORVASC) 10 MG tablet Take 1 tablet (10 mg total) by mouth daily. 90 tablet 3  . aspirin 81 MG tablet Take 81 mg by mouth daily.    . Biotin (BIOTIN MAXIMUM STRENGTH) 10 MG TABS Take 1 tablet by mouth daily.    . Calcium Carbonate-Vitamin D (CALTRATE 600+D PO) Take 1 tablet by mouth 2 (two) times daily.    . carvedilol (COREG) 25 MG tablet TAKE 1 TABLET BY MOUTH 2 TIMES DAILY  WITH A MEAL. 60 tablet 0  . Cholecalciferol (VITAMIN D) 2000 UNITS tablet Take 2,000 Units by mouth daily.    . cloNIDine (CATAPRES) 0.1 MG tablet Take 1 tablet (0.1 mg total) by mouth 2 (two) times daily. 60 tablet 11  . colestipol (COLESTID) 1 g tablet Take 2 g by mouth 2 (two) times daily.    . hydrALAZINE (APRESOLINE) 50 MG tablet Take 1 tablet (50 mg total) by mouth 3 (three) times daily. 270 tablet 3  . lidocaine (XYLOCAINE) 2 % jelly Apply 1 application topically daily as needed (for  pain).    . Multiple Vitamins-Minerals (MULTIVITAMIN PO) Take 1 tablet by mouth daily.    Marland Kitchen PROCTOZONE-HC 2.5 % rectal cream PLACE 1 APPLICATION RECTALLY 2 TIMES A DAY AS NEEDED FOR HEMORRHOIDS. 60 g 11  . valsartan (DIOVAN) 320 MG tablet Take 320 mg by mouth every morning.     No current facility-administered medications on file prior to visit.     No Known Allergies  Blood pressure 138/62, pulse 64, last menstrual period 10/07/1974.   Assessment/Plan: Hypertension: BP today at goal on lower dose of hydralazine. Continue hydralazine 50mg  BID for two weeks. If pressures remain <140/90 discontinue hydralazine. Follow up in HTN clinic 4 weeks after discontinuation of hydralazine to ensure BP remains at goal.    Thank you, [name]. [name], [name]  02/11/2017 2:49 PM

## 2017-03-10 ENCOUNTER — Other Ambulatory Visit: Payer: Self-pay | Admitting: Interventional Cardiology

## 2017-03-10 DIAGNOSIS — E538 Deficiency of other specified B group vitamins: Secondary | ICD-10-CM | POA: Diagnosis not present

## 2017-03-25 ENCOUNTER — Ambulatory Visit (INDEPENDENT_AMBULATORY_CARE_PROVIDER_SITE_OTHER): Payer: Medicare Other | Admitting: Pharmacist

## 2017-03-25 VITALS — BP 138/58 | HR 55

## 2017-03-25 DIAGNOSIS — I2510 Atherosclerotic heart disease of native coronary artery without angina pectoris: Secondary | ICD-10-CM | POA: Diagnosis not present

## 2017-03-25 DIAGNOSIS — I10 Essential (primary) hypertension: Secondary | ICD-10-CM | POA: Diagnosis not present

## 2017-03-25 NOTE — Patient Instructions (Addendum)
Return for a follow up appointment as scheduled with Dr. Tamala Julian   Check your blood pressure at home daily (if able) and keep record of the readings.  Take your BP meds as follows: Continue all medications as prescribed You may take an extra clonidine 0.1mg  if your blood pressure is greater than 170 for the top number.   If you need more than 2 extra doses per week of clonidine please call blood pressure clinic [phone].   Bring all of your meds, your BP cuff and your record of home blood pressures to your next appointment.  Exercise as you're able, try to walk approximately 30 minutes per day.  Keep salt intake to a minimum, especially watch canned and prepared boxed foods.  Eat more fresh fruits and vegetables and fewer canned items.  Avoid eating in fast food restaurants.    HOW TO TAKE YOUR BLOOD PRESSURE: . Rest 5 minutes before taking your blood pressure. .  Don't smoke or drink caffeinated beverages for at least 30 minutes before. . Take your blood pressure before (not after) you eat. . Sit comfortably with your back supported and both feet on the floor (don't cross your legs). . Elevate your arm to heart level on a table or a desk. . Use the proper sized cuff. It should fit smoothly and snugly around your bare upper arm. There should be enough room to slip a fingertip under the cuff. The bottom edge of the cuff should be 1 inch above the crease of the elbow. . Ideally, take 3 measurements at one sitting and record the average.

## 2017-03-25 NOTE — Progress Notes (Signed)
Patient ID: [name]                 DOB: 27-Mar-1934                      MRN: [id number]     HPI: RAISSA DAM is a 83 y.o. female patient of Dr. Arne Cleveland to HTN clinic for medication management. PMH of moderate CAD, HTN, HLD, and premature atrial contractions. Renal ultrasound negative for renal artery stenosis. She was started on clonidine 0.1mg  BID with lunch and dinner and her hydralazine was weaned due to side effects. She was instructed to stop hydralazine altogether if pressures remained <140/90.   She presents today stating she has been doing about the same off hydralazine as on. She reports her pressure are as below - which is essentially the same as when she was on hydralazine. She denies SOB, chest pain, dizziness, headache. She does not feel any different when pressures are high. All pressures are measured about 1 hour medications.   Current HTN meds:  Amlodipine 10 mg -lunch time Valsartan 320 mg - morning Carvedilol 25BID Clonidine 0.1mg  BID - lunch and dinner  Previously tried:Spironolactone 12.5 mg, HCTZ (will avoid diuretics due to CrCl of ~11mL/min)  BP goal: <140/90 mmHg  Family History: Heart disease and HLD (mother), cancer/breast cancer (sister)  Social History: Never smoker, denies alcohol and illicit drug use  Diet:Patient endorses reading labels, avoiding canned foods, and using salt substitutes. She reports eating lean meats and vegetable. She drinks iced tea, but not coffee. Cut out chips from her diet.  Exercise:She goes to [employer] 1-2 week and stays active withhousekeeping.    Home BP readings: Morning pressures 112-145/49-61 (mostly 120s/50s), lunch time 115-153/50-67 (mostly 140s/ 60s), evening 120-180/45-75 (mostly 140s/60s). She only has one or two measurements >852 systolic.   Wt Readings from Last 3 Encounters:  01/21/17 129 lb 12.8 oz (58.9 kg)  09/17/16 132 lb (59.9 kg)  07/22/16 129 lb 9.6 oz (58.8 kg)    BP Readings from Last 3 Encounters:  03/25/17 (!) 138/58  02/11/17 138/62  01/21/17 136/60   Pulse Readings from Last 3 Encounters:  03/25/17 (!) 55  02/11/17 64  01/21/17 63    Renal function: CrCl cannot be calculated (Patient's most recent lab result is older than the maximum 21 days allowed.).  Past Medical History:  Diagnosis Date  . 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 Breast cancer ([place]) 2010  . CAD (coronary artery disease)   . Dysuria   . Frequency of urination   . GERD (gastroesophageal reflux disease)   . Heart murmur   . History of breast cancer ONCOLOGIST-- DR Benay Spice   DX 2010--  RIGHT BREAST DCIS  HIGH GRADE S/P LUMPECTOMY W/ SLN BX--  NO RECURRENCE  . 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 History of skin cancer    excision basal cell  . Hyperlipidemia   . Hypertension   . Kidney stones 7/14  . Nocturia   . 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 Renal stones 2014  . Shingles   . Ureteral calculi    bilateral  . Urgency of urination     Current Outpatient Prescriptions on File Prior to Visit  Medication Sig  Dispense Refill  . amLODipine (NORVASC) 10 MG tablet Take 1 tablet (10 mg total) by mouth daily. 90 tablet 3  . aspirin 81 MG tablet Take 81 mg by mouth daily.    . Biotin (BIOTIN MAXIMUM STRENGTH) 10 MG TABS Take 1 tablet by mouth daily.    . Calcium Carbonate-Vitamin D (CALTRATE 600+D PO) Take 1 tablet by mouth 2 (two) times daily.    . carvedilol (COREG) 25 MG tablet TAKE 1 TABLET BY MOUTH 2 TIMES DAILY WITH A MEAL. 60 tablet 9  . Cholecalciferol (VITAMIN D) 2000 UNITS tablet Take 2,000 Units by mouth daily.    . cloNIDine (CATAPRES) 0.1 MG tablet Take 1 tablet (0.1 mg total) by mouth 2 (two) times daily. 60  tablet 11  . colestipol (COLESTID) 1 g tablet Take 2 g by mouth 2 (two) times daily.    . hydrALAZINE (APRESOLINE) 50 MG tablet Take 1 tablet (50 mg total) by mouth 3 (three) times daily. (Patient taking differently: Take 50 mg by mouth 2 (two) times daily. ) 270 tablet 3  . lidocaine (XYLOCAINE) 2 % jelly Apply 1 application topically daily as needed (for  pain).    . Multiple Vitamins-Minerals (MULTIVITAMIN PO) Take 1 tablet by mouth daily.    Marland Kitchen PROCTOZONE-HC 2.5 % rectal cream PLACE 1 APPLICATION RECTALLY 2 TIMES A DAY AS NEEDED FOR HEMORRHOIDS. 60 g 11  . valsartan (DIOVAN) 320 MG tablet Take 320 mg by mouth every morning.     No current facility-administered medications on file prior to visit.     No Known Allergies  Blood pressure (!) 138/58, pulse (!) 55, last menstrual period 10/07/1974.   Assessment/Plan: Hypertension: BP today is at goal. Overall she has been doing well. Will allow her to take an extra dose of her clonidine if pressure is >570 systolic. Otherwise will continue her current regimen. Advised that if she needs more than 2 doses of clonidine extra per week to call clinic to be seen for BP, otherwise follow up with Dr. Tamala Julian as scheduled.    Thank you, Lelan Pons. Patterson Hammersmith, [facility]  03/25/2017 3:13 PM

## 2017-04-10 DIAGNOSIS — R928 Other abnormal and inconclusive findings on diagnostic imaging of breast: Secondary | ICD-10-CM | POA: Diagnosis not present

## 2017-04-10 DIAGNOSIS — Z853 Personal history of malignant neoplasm of breast: Secondary | ICD-10-CM | POA: Diagnosis not present

## 2017-04-14 DIAGNOSIS — I10 Essential (primary) hypertension: Secondary | ICD-10-CM | POA: Diagnosis not present

## 2017-04-14 DIAGNOSIS — I2510 Atherosclerotic heart disease of native coronary artery without angina pectoris: Secondary | ICD-10-CM | POA: Diagnosis not present

## 2017-04-14 DIAGNOSIS — C50919 Malignant neoplasm of unspecified site of unspecified female breast: Secondary | ICD-10-CM | POA: Diagnosis not present

## 2017-04-14 DIAGNOSIS — E7800 Pure hypercholesterolemia, unspecified: Secondary | ICD-10-CM | POA: Diagnosis not present

## 2017-04-14 DIAGNOSIS — E538 Deficiency of other specified B group vitamins: Secondary | ICD-10-CM | POA: Diagnosis not present

## 2017-04-14 DIAGNOSIS — N200 Calculus of kidney: Secondary | ICD-10-CM | POA: Diagnosis not present

## 2017-04-14 DIAGNOSIS — K649 Unspecified hemorrhoids: Secondary | ICD-10-CM | POA: Diagnosis not present

## 2017-04-25 ENCOUNTER — Telehealth: Payer: Self-pay | Admitting: Pharmacist

## 2017-04-25 MED ORDER — IRBESARTAN 300 MG PO TABS: 300.0000 mg | ORAL_TABLET | Freq: Every day | ORAL | 2 refills | Status: DC

## 2017-04-25 MED ORDER — IRBESARTAN 300 MG PO TABS: 300.0000 mg | ORAL_TABLET | Freq: Every day | ORAL | 0 refills | Status: DC

## 2017-04-25 NOTE — Telephone Encounter (Signed)
New Message   pt verbalized that she is calling for Pharmacist and she did not disclose reason

## 2017-04-25 NOTE — Telephone Encounter (Signed)
Spoke with patient about recall on valsartan. All questions answered. Will change to irebesartan 300mg  daily. Advised to continue to monitor pressures and call with any changes. She states understanding and appreciation.

## 2017-04-29 ENCOUNTER — Encounter: Payer: Self-pay | Admitting: Obstetrics & Gynecology

## 2017-05-05 DIAGNOSIS — H26493 Other secondary cataract, bilateral: Secondary | ICD-10-CM | POA: Diagnosis not present

## 2017-05-05 DIAGNOSIS — H04123 Dry eye syndrome of bilateral lacrimal glands: Secondary | ICD-10-CM | POA: Diagnosis not present

## 2017-05-05 DIAGNOSIS — H52223 Regular astigmatism, bilateral: Secondary | ICD-10-CM | POA: Diagnosis not present

## 2017-05-05 DIAGNOSIS — H524 Presbyopia: Secondary | ICD-10-CM | POA: Diagnosis not present

## 2017-05-07 ENCOUNTER — Other Ambulatory Visit: Payer: Self-pay | Admitting: Interventional Cardiology

## 2017-05-07 MED ORDER — CARVEDILOL 25 MG PO TABS: ORAL_TABLET | ORAL | 2 refills | Status: DC

## 2017-05-07 MED ORDER — CLONIDINE HCL 0.1 MG PO TABS: 0.1000 mg | ORAL_TABLET | Freq: Two times a day (BID) | ORAL | 2 refills | Status: DC

## 2017-05-07 MED ORDER — IRBESARTAN 300 MG PO TABS: 300.0000 mg | ORAL_TABLET | Freq: Every day | ORAL | 2 refills | Status: DC

## 2017-06-08 ENCOUNTER — Other Ambulatory Visit: Payer: Self-pay | Admitting: Interventional Cardiology

## 2017-06-18 DIAGNOSIS — L820 Inflamed seborrheic keratosis: Secondary | ICD-10-CM | POA: Diagnosis not present

## 2017-06-18 DIAGNOSIS — D485 Neoplasm of uncertain behavior of skin: Secondary | ICD-10-CM | POA: Diagnosis not present

## 2017-07-09 DIAGNOSIS — Z23 Encounter for immunization: Secondary | ICD-10-CM | POA: Diagnosis not present

## 2017-08-15 ENCOUNTER — Telehealth: Payer: Self-pay | Admitting: Interventional Cardiology

## 2017-08-15 NOTE — Telephone Encounter (Signed)
Patient instructed to contact her pharmacy to clarify is if that medication was affected by the recall. Patient is to call back if pharmacy is unable to change medication for her. Patient verbalized understanding.

## 2017-08-15 NOTE — Telephone Encounter (Signed)
New message    Patient calling regarding medication recall letter . Please call  Pt c/o medication issue:  1. Name of Medication: irbesartan (AVAPRO) 300 MG tablet  2. How are you currently taking this medication (dosage and times per day)? As prescribed  3. Are you having a reaction (difficulty breathing--STAT)? No  4. What is your medication issue? Request for different medication

## 2017-10-28 ENCOUNTER — Other Ambulatory Visit: Payer: Self-pay | Admitting: Interventional Cardiology

## 2017-10-31 ENCOUNTER — Ambulatory Visit (INDEPENDENT_AMBULATORY_CARE_PROVIDER_SITE_OTHER): Payer: Medicare Other | Admitting: Obstetrics & Gynecology

## 2017-10-31 ENCOUNTER — Other Ambulatory Visit: Payer: Self-pay

## 2017-10-31 ENCOUNTER — Encounter: Payer: Self-pay | Admitting: Obstetrics & Gynecology

## 2017-10-31 VITALS — BP 146/72 | HR 60 | Resp 14 | Ht 65.5 in | Wt 133.0 lb

## 2017-10-31 DIAGNOSIS — Z124 Encounter for screening for malignant neoplasm of cervix: Secondary | ICD-10-CM | POA: Diagnosis not present

## 2017-10-31 DIAGNOSIS — Z01419 Encounter for gynecological examination (general) (routine) without abnormal findings: Secondary | ICD-10-CM

## 2017-10-31 DIAGNOSIS — Z8543 Personal history of malignant neoplasm of ovary: Secondary | ICD-10-CM

## 2017-10-31 NOTE — Progress Notes (Addendum)
84 y.o. G0P0000 MarriedCaucasianF here for annual exam.  Doing well.  Denies vaginal bleeding.  Having regular follow up with cardiologist.    Husband has parkinson's so they generally stay close to some.  PCP:  Dr. Clayton Bibles.  Appt was in the summer.  Blood work done at that visit.  Patient's last menstrual period was 10/07/1974.          Sexually active: No.  The current method of family planning is status post hysterectomy.    Exercising: Yes.    silver sneakers Smoker:  no  Health Maintenance: Pap:  06/05/11 Normal  History of abnormal Pap:  no MMG:  04/10/17 BIRADS2:Benign  Colonoscopy:  2009 BMD:   04/04/16 Osteopenia TDaP:  PCP Pneumonia vaccine(s):  PCP Shingrix: PCP Hep C testing: n/a Screening Labs: PCP   reports that  has never smoked. she has never used smokeless tobacco. She reports that she does not drink alcohol or use drugs.  Past Medical History:  Diagnosis Date  . 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 Breast cancer ([place]) 2010  . CAD (coronary artery disease)   . Dysuria   . Frequency of urination   . GERD (gastroesophageal reflux disease)   . Heart murmur   . History of breast cancer ONCOLOGIST-- DR Benay Spice   DX 2010--  RIGHT BREAST DCIS  HIGH GRADE S/P LUMPECTOMY W/ SLN BX--  NO RECURRENCE  . 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 History of skin cancer    excision basal cell  . Hyperlipidemia   . Hypertension   . Kidney stones 7/14  . Nocturia   . 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 Renal stones 2014  . Shingles   . Ureteral calculi    bilateral  . Urgency of urination     Past Surgical History:  Procedure Laterality Date  . APPENDECTOMY    . BREAST LUMPECTOMY Right 04-13-2009   W/ SLN BX  . CARDIAC CATHETERIZATION   11-11-2005 DR Daneen Schick   MODERATE LAD DISEASE/ NORMAL LVF/ EF 65-75%  . CATARACT EXTRACTION W/ INTRAOCULAR LENS IMPLANT Left 04-26-2013  . CHOLECYSTECTOMY  1990  . CYSTOSCOPY W/ URETERAL STENT PLACEMENT Bilateral 05/07/2013   Procedure: CYSTOSCOPY WITH STENT REPLACEMENTS;  Surgeon: Alexis Frock, MD;  [facility];  Service: Urology;  Laterality: Bilateral;  . CYSTOSCOPY WITH BIOPSY N/A 04/07/2013   Procedure: CYSTOSCOPY WITH BIOPSY and fulgeration;  Surgeon: Alexis Frock, MD;  Location: [facility];  Service: Urology;  Laterality: N/A;  . CYSTOSCOPY WITH RETROGRADE PYELOGRAM, URETEROSCOPY AND STENT PLACEMENT Bilateral 05/07/2013   Procedure: CYSTOSCOPY WITH RETROGRADE PYELOGRAM, URETEROSCOPY ;  Surgeon: Alexis Frock, MD;  Location[facility];  Service: Urology;  Laterality: Bilateral;  . CYSTOSCOPY WITH STENT PLACEMENT Bilateral 04/07/2013   Procedure: CYSTOSCOPY WITH STENT PLACEMENT;  Surgeon: Alexis Frock, MD;  Location: [facility];  Service: Urology;  Laterality: Bilateral;  . DX LAPAROSCOPY W/ PERITONEAL AND OMENTAL BX'S AND WASHINGS  02-16-2008  . EXCISION RIGHT BREAST MASS  02-10-2002  . EXP. LAP. EXTENSIVE ADHESIOLYSIS/ RESECTION TERMINAL ILEUM , ASCENDING AND DESCENDING COLON WITH CREATION ILEOSTOMY AND MUCOUS FISTULA  02-19-2008   PERFERATION AND ABD. CANCER--  TAKEDOWN ILEOSTOMY 08-23-2008  . TOTAL ABDOMINAL HYSTERECTOMY W/ BILATERAL SALPINGOOPHORECTOMY  1976  . TOTAL  ABDOMINAL HYSTERECTOMY W/ BILATERAL SALPINGOOPHORECTOMY  1976  . VULVAR LESION REMOVAL N/A 08/22/2014   Procedure: EXCISION OF VULVAR CYST  ;  Surgeon: Lyman Speller, MD;  Location: [facility];  Service: Gynecology;  Laterality: N/A;  . WOUND DEBRIDEMENT  05/28/2012   Procedure: DEBRIDEMENT ABDOMINAL WOUND;  Surgeon: Haywood Lasso, MD;  Location: [facility];  Service: General;  Laterality: N/A;  excision chronic wound abdominal wall    Current Outpatient  Medications  Medication Sig Dispense Refill  . amLODipine (NORVASC) 10 MG tablet Take 1 tablet (10 mg total) by mouth daily. 90 tablet 1  . aspirin 81 MG tablet Take 81 mg by mouth daily.    . Biotin (BIOTIN MAXIMUM STRENGTH) 10 MG TABS Take 1 tablet by mouth daily.    . Calcium Carbonate-Vitamin D (CALTRATE 600+D PO) Take 1 tablet by mouth 2 (two) times daily.    . carvedilol (COREG) 25 MG tablet TAKE 1 TABLET BY MOUTH 2 TIMES DAILY WITH A MEAL. 180 tablet 2  . Cholecalciferol (VITAMIN D) 2000 UNITS tablet Take 2,000 Units by mouth daily.    . cloNIDine (CATAPRES) 0.1 MG tablet TAKE 1 TABLET TWICE A DAY 180 tablet 0  . colestipol (COLESTID) 1 g tablet Take 2 g by mouth 2 (two) times daily.    . irbesartan (AVAPRO) 300 MG tablet TAKE 1 TABLET DAILY 90 tablet 0  . lidocaine (XYLOCAINE) 2 % jelly Apply 1 application topically daily as needed (for  pain).    . Multiple Vitamins-Minerals (MULTIVITAMIN PO) Take 1 tablet by mouth daily.    Marland Kitchen PROCTOZONE-HC 2.5 % rectal cream PLACE 1 APPLICATION RECTALLY 2 TIMES A DAY AS NEEDED FOR HEMORRHOIDS. 60 g 11   No current facility-administered medications for this visit.     Family History  Problem Relation Age of Onset  . Heart disease Mother   . Hyperlipidemia Mother   . Breast cancer Sister   . Cancer Sister        breast    ROS:  Pertinent items are noted in HPI.  Otherwise, a comprehensive ROS was negative.  Exam:   BP (!) 146/72 (BP Location: Right Arm, Patient Position: Sitting, Cuff Size: Normal)   Pulse 60   Resp 14   Ht 5' 5.5" (1.664 m)   Wt 133 lb (60.3 kg)   LMP 10/07/1974   BMI 21.80 kg/m      Height: 5' 5.5" (166.4 cm)  Ht Readings from Last 3 Encounters:  10/31/17 5' 5.5" (1.664 m)  01/21/17 5\' 7"  (1.702 m)  07/22/16 5\' 6"  (1.676 m)    General appearance: alert, cooperative and appears stated age Head: Normocephalic, without obvious abnormality, atraumatic Neck: no adenopathy, supple, symmetrical, trachea midline and  thyroid normal to inspection and palpation Lungs: clear to auscultation bilaterally Breasts: normal appearance, no masses or tenderness multiple well healed scars Heart: regular rate and rhythm Abdomen: soft, non-tender; bowel sounds normal; no masses,  no organomegaly, multiple well healed scars due to prior surgeries Extremities: extremities normal, atraumatic, no cyanosis or edema Skin: Skin color, texture, turgor normal. No rashes or lesions Lymph nodes: Cervical, supraclavicular, and axillary nodes normal. No abnormal inguinal nodes palpated Neurologic: Grossly normal   Pelvic: External genitalia:  no lesions              Urethra:  normal appearing urethra with no masses, tenderness or lesions              Bartholins and  Skenes: normal                 Vagina: normal appearing vagina with normal color and discharge, no lesions              Cervix: absent              Pap taken: No. Bimanual Exam:  Uterus:  uterus absent              Adnexa: no mass, fullness, tenderness               Rectovaginal: Confirms               Anus:  normal sphincter tone, no lesions  Chaperone was present for exam.  A:  Well Woman with normal exam H/O breast ductal ca 6/10 (high grade DSIC with 0.79mm area of invasion), s/p radiation.  Treated with Armiidex 7/10 - 5/15. 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  H/O C diff colitis H/O bowel resection Hypertension  P:   Mammogram yearly.  Doing 3D MMGs Pap not indicated Ca-125 obtained today.  As surgery was in 1976, feel ok for pt to follow up in two years, or sooner if has any new issues/concerns. Lab work, vaccines UTD except Shingrix vaccination.  Not sure she wants to receive this.

## 2017-11-01 LAB — CA 125: CANCER ANTIGEN (CA) 125: 12.8 U/mL (ref 0.0–38.1)

## 2017-11-04 DIAGNOSIS — E538 Deficiency of other specified B group vitamins: Secondary | ICD-10-CM | POA: Diagnosis not present

## 2017-11-04 DIAGNOSIS — I10 Essential (primary) hypertension: Secondary | ICD-10-CM | POA: Diagnosis not present

## 2017-11-07 DIAGNOSIS — E538 Deficiency of other specified B group vitamins: Secondary | ICD-10-CM | POA: Diagnosis not present

## 2017-11-14 DIAGNOSIS — E538 Deficiency of other specified B group vitamins: Secondary | ICD-10-CM | POA: Diagnosis not present

## 2017-11-21 DIAGNOSIS — E538 Deficiency of other specified B group vitamins: Secondary | ICD-10-CM | POA: Diagnosis not present

## 2017-11-28 DIAGNOSIS — E538 Deficiency of other specified B group vitamins: Secondary | ICD-10-CM | POA: Diagnosis not present

## 2017-12-01 DIAGNOSIS — I2510 Atherosclerotic heart disease of native coronary artery without angina pectoris: Secondary | ICD-10-CM | POA: Diagnosis not present

## 2017-12-01 DIAGNOSIS — I10 Essential (primary) hypertension: Secondary | ICD-10-CM | POA: Diagnosis not present

## 2017-12-01 DIAGNOSIS — C50919 Malignant neoplasm of unspecified site of unspecified female breast: Secondary | ICD-10-CM | POA: Diagnosis not present

## 2017-12-01 DIAGNOSIS — M1990 Unspecified osteoarthritis, unspecified site: Secondary | ICD-10-CM | POA: Diagnosis not present

## 2017-12-01 DIAGNOSIS — E785 Hyperlipidemia, unspecified: Secondary | ICD-10-CM | POA: Diagnosis not present

## 2017-12-02 DIAGNOSIS — I10 Essential (primary) hypertension: Secondary | ICD-10-CM | POA: Diagnosis not present

## 2017-12-29 DIAGNOSIS — E538 Deficiency of other specified B group vitamins: Secondary | ICD-10-CM | POA: Diagnosis not present

## 2017-12-31 DIAGNOSIS — C50919 Malignant neoplasm of unspecified site of unspecified female breast: Secondary | ICD-10-CM | POA: Diagnosis not present

## 2017-12-31 DIAGNOSIS — I10 Essential (primary) hypertension: Secondary | ICD-10-CM | POA: Diagnosis not present

## 2017-12-31 DIAGNOSIS — M1990 Unspecified osteoarthritis, unspecified site: Secondary | ICD-10-CM | POA: Diagnosis not present

## 2017-12-31 DIAGNOSIS — I2510 Atherosclerotic heart disease of native coronary artery without angina pectoris: Secondary | ICD-10-CM | POA: Diagnosis not present

## 2018-01-07 ENCOUNTER — Other Ambulatory Visit: Payer: Self-pay | Admitting: *Deleted

## 2018-01-07 DIAGNOSIS — R0989 Other specified symptoms and signs involving the circulatory and respiratory systems: Secondary | ICD-10-CM

## 2018-01-08 ENCOUNTER — Other Ambulatory Visit: Payer: Self-pay | Admitting: Interventional Cardiology

## 2018-01-08 DIAGNOSIS — I6523 Occlusion and stenosis of bilateral carotid arteries: Secondary | ICD-10-CM

## 2018-01-08 DIAGNOSIS — R0989 Other specified symptoms and signs involving the circulatory and respiratory systems: Secondary | ICD-10-CM

## 2018-01-27 ENCOUNTER — Other Ambulatory Visit: Payer: Self-pay | Admitting: Interventional Cardiology

## 2018-01-27 DIAGNOSIS — I2510 Atherosclerotic heart disease of native coronary artery without angina pectoris: Secondary | ICD-10-CM | POA: Diagnosis not present

## 2018-01-27 DIAGNOSIS — M1990 Unspecified osteoarthritis, unspecified site: Secondary | ICD-10-CM | POA: Diagnosis not present

## 2018-01-27 DIAGNOSIS — E785 Hyperlipidemia, unspecified: Secondary | ICD-10-CM | POA: Diagnosis not present

## 2018-01-27 DIAGNOSIS — I10 Essential (primary) hypertension: Secondary | ICD-10-CM | POA: Diagnosis not present

## 2018-01-27 DIAGNOSIS — C50919 Malignant neoplasm of unspecified site of unspecified female breast: Secondary | ICD-10-CM | POA: Diagnosis not present

## 2018-01-30 DIAGNOSIS — E538 Deficiency of other specified B group vitamins: Secondary | ICD-10-CM | POA: Diagnosis not present

## 2018-02-15 ENCOUNTER — Other Ambulatory Visit: Payer: Self-pay | Admitting: Interventional Cardiology

## 2018-02-18 ENCOUNTER — Ambulatory Visit (HOSPITAL_COMMUNITY)
Admission: RE | Admit: 2018-02-18 | Discharge: 2018-02-18 | Disposition: A | Discharge status: Home / Self Care | Payer: Medicare Other | Source: Ambulatory Visit | Attending: Internal Medicine | Admitting: Internal Medicine

## 2018-02-18 DIAGNOSIS — R0989 Other specified symptoms and signs involving the circulatory and respiratory systems: Secondary | ICD-10-CM | POA: Diagnosis not present

## 2018-02-18 DIAGNOSIS — E785 Hyperlipidemia, unspecified: Secondary | ICD-10-CM | POA: Insufficient documentation

## 2018-02-18 DIAGNOSIS — I10 Essential (primary) hypertension: Secondary | ICD-10-CM | POA: Insufficient documentation

## 2018-02-18 DIAGNOSIS — I6523 Occlusion and stenosis of bilateral carotid arteries: Secondary | ICD-10-CM

## 2018-02-18 DIAGNOSIS — I2510 Atherosclerotic heart disease of native coronary artery without angina pectoris: Secondary | ICD-10-CM | POA: Insufficient documentation

## 2018-03-04 DIAGNOSIS — I2510 Atherosclerotic heart disease of native coronary artery without angina pectoris: Secondary | ICD-10-CM | POA: Diagnosis not present

## 2018-03-04 DIAGNOSIS — D510 Vitamin B12 deficiency anemia due to intrinsic factor deficiency: Secondary | ICD-10-CM | POA: Diagnosis not present

## 2018-03-04 DIAGNOSIS — I10 Essential (primary) hypertension: Secondary | ICD-10-CM | POA: Diagnosis not present

## 2018-03-16 ENCOUNTER — Other Ambulatory Visit: Payer: Self-pay | Admitting: Interventional Cardiology

## 2018-03-17 DIAGNOSIS — K649 Unspecified hemorrhoids: Secondary | ICD-10-CM | POA: Diagnosis not present

## 2018-03-17 DIAGNOSIS — Z1211 Encounter for screening for malignant neoplasm of colon: Secondary | ICD-10-CM | POA: Diagnosis not present

## 2018-03-17 DIAGNOSIS — I10 Essential (primary) hypertension: Secondary | ICD-10-CM | POA: Diagnosis not present

## 2018-03-17 DIAGNOSIS — Z0000 Encounter for general adult medical examination without abnormal findings: Secondary | ICD-10-CM | POA: Diagnosis not present

## 2018-03-17 DIAGNOSIS — E785 Hyperlipidemia, unspecified: Secondary | ICD-10-CM | POA: Diagnosis not present

## 2018-03-17 DIAGNOSIS — Z1389 Encounter for screening for other disorder: Secondary | ICD-10-CM | POA: Diagnosis not present

## 2018-03-17 DIAGNOSIS — Z23 Encounter for immunization: Secondary | ICD-10-CM | POA: Diagnosis not present

## 2018-03-17 DIAGNOSIS — Z1239 Encounter for other screening for malignant neoplasm of breast: Secondary | ICD-10-CM | POA: Diagnosis not present

## 2018-03-17 DIAGNOSIS — D510 Vitamin B12 deficiency anemia due to intrinsic factor deficiency: Secondary | ICD-10-CM | POA: Diagnosis not present

## 2018-03-17 DIAGNOSIS — I2510 Atherosclerotic heart disease of native coronary artery without angina pectoris: Secondary | ICD-10-CM | POA: Diagnosis not present

## 2018-03-17 DIAGNOSIS — M1990 Unspecified osteoarthritis, unspecified site: Secondary | ICD-10-CM | POA: Diagnosis not present

## 2018-04-06 DIAGNOSIS — E538 Deficiency of other specified B group vitamins: Secondary | ICD-10-CM | POA: Diagnosis not present

## 2018-04-13 ENCOUNTER — Encounter: Payer: Self-pay | Admitting: Interventional Cardiology

## 2018-04-13 DIAGNOSIS — M8589 Other specified disorders of bone density and structure, multiple sites: Secondary | ICD-10-CM | POA: Diagnosis not present

## 2018-04-13 DIAGNOSIS — Z853 Personal history of malignant neoplasm of breast: Secondary | ICD-10-CM | POA: Diagnosis not present

## 2018-04-13 DIAGNOSIS — Z1231 Encounter for screening mammogram for malignant neoplasm of breast: Secondary | ICD-10-CM | POA: Diagnosis not present

## 2018-04-26 NOTE — Progress Notes (Signed)
Cardiology Office Note    Date:  04/28/2018   ID:  Kamariyah, Timberlake 1933-10-09, MRN [id number]  PCP:  Leeroy Cha, MD  Cardiologist: Sinclair Grooms, MD   Chief Complaint  Patient presents with  . Coronary Artery Disease  . Hypertension    History of Present Illness:  [name] is a 84 y.o. female  with moderate CAD,  hypertension, hyperlipidemia, and premature atrial contractions.  She is doing well.  She denies chest discomfort.  She has occasional left pectoral discomfort in the evenings and early mornings that seems to improve with standing and changing position.  It never occurs with activity.  She denies dyspnea.  She has not had syncope.   Past Medical History:  Diagnosis Date  . 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 Breast cancer ([place]) 2010  . CAD (coronary artery disease)   . Dysuria   . Frequency of urination   . GERD (gastroesophageal reflux disease)   . Heart murmur   . History of breast cancer ONCOLOGIST-- DR Benay Spice   DX 2010--  RIGHT BREAST DCIS  HIGH GRADE S/P LUMPECTOMY W/ SLN BX--  NO RECURRENCE  . 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 History of skin cancer    excision basal cell  . Hyperlipidemia   . Hypertension   . Kidney stones 7/14  . Nocturia   . 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 Renal stones 2014  . Shingles   . Ureteral calculi    bilateral  . Urgency of urination     Past Surgical History:  Procedure Laterality Date  . APPENDECTOMY    . BREAST LUMPECTOMY Right 04-13-2009   W/ SLN BX  . CARDIAC CATHETERIZATION  11-11-2005 DR Daneen Schick   MODERATE LAD DISEASE/ NORMAL LVF/ EF 65-75%  . CATARACT EXTRACTION W/ INTRAOCULAR LENS IMPLANT Left 04-26-2013  . CHOLECYSTECTOMY   1990  . CYSTOSCOPY W/ URETERAL STENT PLACEMENT Bilateral 05/07/2013   Procedure: CYSTOSCOPY WITH STENT REPLACEMENTS;  Surgeon: Alexis Frock, MD;  [facility];  Service: Urology;  Laterality: Bilateral;  . CYSTOSCOPY WITH BIOPSY N/A 04/07/2013   Procedure: CYSTOSCOPY WITH BIOPSY and fulgeration;  Surgeon: Alexis Frock, MD;  [facility];  Service: Urology;  Laterality: N/A;  . CYSTOSCOPY WITH RETROGRADE PYELOGRAM, URETEROSCOPY AND STENT PLACEMENT Bilateral 05/07/2013   Procedure: CYSTOSCOPY WITH RETROGRADE PYELOGRAM, URETEROSCOPY ;  Surgeon: Alexis Frock, MD;  Location: [facility];  Service: Urology;  Laterality: Bilateral;  . CYSTOSCOPY WITH STENT PLACEMENT Bilateral 04/07/2013   Procedure: CYSTOSCOPY WITH STENT PLACEMENT;  Surgeon: Alexis Frock, MD;  Location[facility];  Service: Urology;  Laterality: Bilateral;  . DX LAPAROSCOPY W/ PERITONEAL AND OMENTAL BX'S AND WASHINGS  02-16-2008  . EXCISION RIGHT BREAST MASS  02-10-2002  . EXP. LAP. EXTENSIVE ADHESIOLYSIS/ RESECTION TERMINAL ILEUM , ASCENDING AND DESCENDING COLON WITH CREATION ILEOSTOMY AND MUCOUS FISTULA  02-19-2008   PERFERATION AND ABD. CANCER--  TAKEDOWN ILEOSTOMY 08-23-2008  . TOTAL ABDOMINAL HYSTERECTOMY W/ BILATERAL SALPINGOOPHORECTOMY  1976  . TOTAL ABDOMINAL HYSTERECTOMY W/ BILATERAL SALPINGOOPHORECTOMY  1976  . VULVAR LESION REMOVAL N/A 08/22/2014   Procedure: EXCISION OF VULVAR CYST  ;  Surgeon: Lyman Speller, MD;  Location: [facility];  Service: Gynecology;  Laterality:  N/A;  . WOUND DEBRIDEMENT  05/28/2012   Procedure: DEBRIDEMENT ABDOMINAL WOUND;  Surgeon: Haywood Lasso, MD;  Location: [facility];  Service: General;  Laterality: N/A;  excision chronic wound abdominal wall    Current Medications: Outpatient Medications Prior to Visit  Medication Sig Dispense Refill  . amLODipine (NORVASC) 10 MG tablet Take 1 tablet (10 mg total) by mouth daily. 90 tablet 1  .  aspirin 81 MG tablet Take 81 mg by mouth daily.    . Biotin (BIOTIN MAXIMUM STRENGTH) 10 MG TABS Take 1 tablet by mouth daily.    . Calcium Carbonate-Vitamin D (CALTRATE 600+D PO) Take 1 tablet by mouth 2 (two) times daily.    . carvedilol (COREG) 25 MG tablet Take 1 tablet (25 mg total) by mouth 2 (two) times daily with a meal. Please keep 7/23 appointment for additional refills thanks. 180 tablet 0  . Cholecalciferol (VITAMIN D) 2000 UNITS tablet Take 2,000 Units by mouth daily.    . cloNIDine (CATAPRES) 0.1 MG tablet Take 1 tablet (0.1 mg total) by mouth 2 (two) times daily. Please keep upcoming appointment 7/23 for additional refills thanks. 180 tablet 0  . colestipol (COLESTID) 1 g tablet Take 2 g by mouth 2 (two) times daily.    . irbesartan (AVAPRO) 300 MG tablet Take 300 mg by mouth daily.    Marland Kitchen lidocaine (XYLOCAINE) 2 % jelly Apply 1 application topically daily as needed (for  pain).    . Multiple Vitamins-Minerals (MULTIVITAMIN PO) Take 1 tablet by mouth daily.    Marland Kitchen PROCTOZONE-HC 2.5 % rectal cream PLACE 1 APPLICATION RECTALLY 2 TIMES A DAY AS NEEDED FOR HEMORRHOIDS. 60 g 11  . valsartan (DIOVAN) 320 MG tablet Take 320 mg by mouth daily.    . irbesartan (AVAPRO) 300 MG tablet TAKE 1 TABLET DAILY 90 tablet 0   No facility-administered medications prior to visit.      Allergies:   Patient has no known allergies.   Social History   Socioeconomic History  . Marital status: Married    Spouse name: Not on file  . Number of children: Not on file  . Years of education: Not on file  . Highest education level: Not on file  Occupational History  . Not on file  Social Needs  . Financial resource strain: Not on file  . Food insecurity:    Worry: Not on file    Inability: Not on file  . Transportation needs:    Medical: Not on file    Non-medical: Not on file  Tobacco Use  . Smoking status: Never Smoker  . Smokeless tobacco: Never Used  Substance and Sexual Activity  . Alcohol  use: No  . Drug use: No  . Sexual activity: Never    Partners: Male    Birth control/protection: Surgical    Comment: Hysterectomy  Lifestyle  . Physical activity:    Days per week: Not on file    Minutes per session: Not on file  . Stress: Not on file  Relationships  . Social connections:    Talks on phone: Not on file    Gets together: Not on file    Attends religious service: Not on file    Active member of club or organization: Not on file    Attends meetings of clubs or organizations: Not on file    Relationship status: Not on file  Other Topics Concern  . Not on file  Social History Narrative  .  Not on file     Family History:  The patient's family history includes Breast cancer in her sister; Cancer in her sister; Heart disease in her mother; Hyperlipidemia in her mother.   ROS:   Please see the history of present illness.    Occasional insomnia.  Legs feel cold at night. All other systems reviewed and are negative.   PHYSICAL EXAM:   VS:  BP 138/62   Pulse (!) 59   Ht 5\' 6"  (1.676 m)   Wt 134 lb 6.4 oz (61 kg)   LMP 10/07/1974   BMI 21.69 kg/m    GEN: Well nourished, well developed, in no acute distress  HEENT: normal  Neck: no JVD, positive soft left carotid bruit.  No masses are noted in the neck. Cardiac: RRR; no murmurs, rubs, or gallops,no edema  Respiratory:  clear to auscultation bilaterally, normal work of breathing GI: soft, nontender, nondistended, + BS MS: no deformity or atrophy  Skin: warm and dry, no rash Neuro:  Alert and Oriented x 3, Strength and sensation are intact Psych: euthymic mood, full affect  Wt Readings from Last 3 Encounters:  04/28/18 134 lb 6.4 oz (61 kg)  10/31/17 133 lb (60.3 kg)  01/21/17 129 lb 12.8 oz (58.9 kg)      Studies/Labs Reviewed:   EKG:  EKG sinus bradycardia at 59 bpm.  QS pattern V1 through V2.  Nonspecific T wave flattening.  Appeared to prior electrocardiograms no significant change has occurred and  PACs are not as frequent.  Recent Labs: No results found for requested labs within last 8760 hours.   Lipid Panel    Component Value Date/Time   CHOL  02/29/2008 0515    61        ATP III CLASSIFICATION:  <200     mg/dL   Desirable  200-239  mg/dL   Borderline High  >=240    mg/dL   High   TRIG 110 02/29/2008 0515    Additional studies/ records that were reviewed today include:  None    ASSESSMENT:    1. Essential hypertension   2. Other hyperlipidemia   3. Left carotid bruit   4. Coronary artery disease involving native coronary artery of native heart without angina pectoris      PLAN:  In order of problems listed above:  1. Very well controlled blood pressure.  Our target is 130/80 mmHg 2. The most recent lipid panel was reviewed.  LDL was noted to be 35 in May 2019.  Target is less than 70. 3. Asymptomatic without focal neurological complaints. 4. She is stable without angina or exertional limitations.  No change in medical therapy.  Continue aerobic activity.  We discussed risk factor modification.  She understands this well.    Medication Adjustments/Labs and Tests Ordered: Current medicines are reviewed at length with the patient today.  Concerns regarding medicines are outlined above.  Medication changes, Labs and Tests ordered today are listed in the Patient Instructions below. There are no Patient Instructions on file for this visit.   Signed, Sinclair Grooms, MD  04/28/2018 8:44 AM    [facility] [address], [place], [place]  [address] Phone: [phone]; Fax: [phone]

## 2018-04-28 ENCOUNTER — Encounter: Payer: Self-pay | Admitting: Interventional Cardiology

## 2018-04-28 ENCOUNTER — Ambulatory Visit (INDEPENDENT_AMBULATORY_CARE_PROVIDER_SITE_OTHER): Payer: Medicare Other | Admitting: Interventional Cardiology

## 2018-04-28 VITALS — BP 138/62 | HR 59 | Ht 66.0 in | Wt 134.4 lb

## 2018-04-28 DIAGNOSIS — I10 Essential (primary) hypertension: Secondary | ICD-10-CM

## 2018-04-28 DIAGNOSIS — R0989 Other specified symptoms and signs involving the circulatory and respiratory systems: Secondary | ICD-10-CM

## 2018-04-28 DIAGNOSIS — E7849 Other hyperlipidemia: Secondary | ICD-10-CM | POA: Diagnosis not present

## 2018-04-28 DIAGNOSIS — I2510 Atherosclerotic heart disease of native coronary artery without angina pectoris: Secondary | ICD-10-CM | POA: Diagnosis not present

## 2018-04-28 DIAGNOSIS — I6523 Occlusion and stenosis of bilateral carotid arteries: Secondary | ICD-10-CM | POA: Diagnosis not present

## 2018-04-28 NOTE — Patient Instructions (Signed)
Medication Instructions:  Your physician recommends that you continue on your current medications as directed. Please refer to the Current Medication list given to you today.   Labwork: None  Testing/Procedures: None  Follow-Up: Your physician wants you to follow-up in: 1 Year with Dr. Smith. You will receive a reminder letter in the mail two months in advance. If you don't receive a letter, please call our office to schedule the follow-up appointment.  If you need a refill on your cardiac medications before your next appointment, please call your pharmacy.   

## 2018-05-06 DIAGNOSIS — H26493 Other secondary cataract, bilateral: Secondary | ICD-10-CM | POA: Diagnosis not present

## 2018-05-06 DIAGNOSIS — H524 Presbyopia: Secondary | ICD-10-CM | POA: Diagnosis not present

## 2018-05-06 DIAGNOSIS — H04123 Dry eye syndrome of bilateral lacrimal glands: Secondary | ICD-10-CM | POA: Diagnosis not present

## 2018-05-06 DIAGNOSIS — H52223 Regular astigmatism, bilateral: Secondary | ICD-10-CM | POA: Diagnosis not present

## 2018-05-08 DIAGNOSIS — E538 Deficiency of other specified B group vitamins: Secondary | ICD-10-CM | POA: Diagnosis not present

## 2018-06-12 DIAGNOSIS — D510 Vitamin B12 deficiency anemia due to intrinsic factor deficiency: Secondary | ICD-10-CM | POA: Diagnosis not present

## 2018-07-13 DIAGNOSIS — Z23 Encounter for immunization: Secondary | ICD-10-CM | POA: Diagnosis not present

## 2018-07-13 DIAGNOSIS — E538 Deficiency of other specified B group vitamins: Secondary | ICD-10-CM | POA: Diagnosis not present

## 2018-08-17 DIAGNOSIS — E538 Deficiency of other specified B group vitamins: Secondary | ICD-10-CM | POA: Diagnosis not present

## 2018-08-28 DIAGNOSIS — E785 Hyperlipidemia, unspecified: Secondary | ICD-10-CM | POA: Diagnosis not present

## 2018-08-28 DIAGNOSIS — F5101 Primary insomnia: Secondary | ICD-10-CM | POA: Diagnosis not present

## 2018-08-28 DIAGNOSIS — I10 Essential (primary) hypertension: Secondary | ICD-10-CM | POA: Diagnosis not present

## 2018-08-28 DIAGNOSIS — I2510 Atherosclerotic heart disease of native coronary artery without angina pectoris: Secondary | ICD-10-CM | POA: Diagnosis not present

## 2018-09-18 DIAGNOSIS — E538 Deficiency of other specified B group vitamins: Secondary | ICD-10-CM | POA: Diagnosis not present

## 2018-10-02 DIAGNOSIS — I10 Essential (primary) hypertension: Secondary | ICD-10-CM | POA: Diagnosis not present

## 2018-10-02 DIAGNOSIS — F5101 Primary insomnia: Secondary | ICD-10-CM | POA: Diagnosis not present

## 2018-10-23 DIAGNOSIS — E538 Deficiency of other specified B group vitamins: Secondary | ICD-10-CM | POA: Diagnosis not present

## 2018-12-01 DIAGNOSIS — I10 Essential (primary) hypertension: Secondary | ICD-10-CM | POA: Diagnosis not present

## 2018-12-01 DIAGNOSIS — E538 Deficiency of other specified B group vitamins: Secondary | ICD-10-CM | POA: Diagnosis not present

## 2018-12-01 DIAGNOSIS — I2510 Atherosclerotic heart disease of native coronary artery without angina pectoris: Secondary | ICD-10-CM | POA: Diagnosis not present

## 2018-12-31 ENCOUNTER — Other Ambulatory Visit: Payer: Self-pay | Admitting: Urology

## 2018-12-31 DIAGNOSIS — N200 Calculus of kidney: Secondary | ICD-10-CM | POA: Diagnosis not present

## 2018-12-31 DIAGNOSIS — R109 Unspecified abdominal pain: Secondary | ICD-10-CM | POA: Diagnosis not present

## 2019-01-01 NOTE — Progress Notes (Signed)
[place] 04-28-18 Epic  EKG 04-28-18 EPIC

## 2019-01-01 NOTE — Progress Notes (Signed)
SPOKE W/  Evalin     SCREENING SYMPTOMS OF COVID 19:   COUGH---no  RUNNY NOSE--- no  SORE THROAT---no  SHORTNESS OF BREATH---no  DIFFICULTY BREATHING---no  TEMP >100.4-----no HAVE YOU OR ANY FAMILY MEMBER TRAVELLED PAST 14 DAYS OUT OF THE   COUNTY---no STATE----no COUNTRY----no  HAVE YOU OR ANY FAMILY MEMBER BEEN EXPOSED TO ANYONE WITH COVID 19?no

## 2019-01-01 NOTE — Patient Instructions (Addendum)
[name]    Your procedure is scheduled on:01/06/2019   Report to [facility] Main  Entrance  Report to admitting at 12:45 pm    Call this number if you have problems the morning of surgery [phone]    Remember: Do not eat food  :After Midnight. CLEAR LIQUIDS MIDNIGHT UNTIL 845 AM. NOTHING BY MOUTH AFTER 845 AM DAY OF SURGERY. BRUSH YOUR TEETH MORNING OF SURGERY AND RINSE YOUR MOUTH OUT, NO CHEWING GUM CANDY OR MINTS.     CLEAR LIQUID DIET   Foods Allowed                                                                     Foods Excluded  Coffee and tea, regular and decaf                             liquids that you cannot  Plain Jell-O in any flavor                                             see through such as: Fruit ices (not with fruit pulp)                                     milk, soups, orange juice  Iced Popsicles                                    All solid food Carbonated beverages, regular and diet                                    Cranberry, grape and apple juices Sports drinks like Gatorade Lightly seasoned clear broth or consume(fat free) Sugar, honey syrup  Sample Menu Breakfast                                Lunch                                     Supper Cranberry juice                    Beef broth                            Chicken broth Jell-O                                     Grape juice  Apple juice Coffee or tea                        Jell-O                                      Popsicle                                                Coffee or tea                        Coffee or tea  _____________________________________________________________________     Take these medicines the morning of surgery with A SIP OF WATER: Clonidine(catapress), Carvedilol(Coreg), Amlodipine(Norvasc)                                You may not have any metal on your body including hair pins and    piercings  Do not wear jewelry, make-up, lotions, powders or perfumes, deodorant             Do not wear nail polish.  Do not shave  48 hours prior to surgery.               Do not bring valuables to the hospital. [place].  Contacts, dentures or bridgework may not be worn into surgery.  Only one Family member to wait in [place].     Patients discharged the day of surgery will not be allowed to drive home.  IF YOU ARE HAVING SURGERY AND GOING HOME THE SAME DAY, YOU MUST HAVE AN ADULT TO DRIVE YOU HOME AND BE WITH YOU FOR 24 HOURS.  YOU MAY GO HOME BY TAXI OR UBER OR ORTHERWISE, BUT AN ADULT MUST ACCOMPANY YOU HOME AND STAY WITH YOU FOR 24 HOURS.  Name and phone number of your driver: [place] [phone]  Special Instructions: N/A              Please read over the following fact sheets you were given: _____________________________________________________________________             [facility] - Preparing for Surgery Before surgery, you can play an important role.  Because skin is not sterile, your skin needs to be as free of germs as possible.  You can reduce the number of germs on your skin by washing with CHG (chlorahexidine gluconate) soap before surgery.  CHG is an antiseptic cleaner which kills germs and bonds with the skin to continue killing germs even after washing. Please DO NOT use if you have an allergy to CHG or antibacterial soaps.  If your skin becomes reddened/irritated stop using the CHG and inform your nurse when you arrive at Short Stay. Do not shave (including legs and underarms) for at least 48 hours prior to the first CHG shower.  You may shave your face/neck. Please follow these instructions carefully:  1.  Shower with CHG Soap the night before surgery and the  morning of Surgery.  2.  If you choose to wash your hair, wash your  hair first as usual with your  normal  shampoo.  3.  After you  shampoo, rinse your hair and body thoroughly to remove the  shampoo.                           4.  Use CHG as you would any other liquid soap.  You can apply chg directly  to the skin and wash                       Gently with a scrungie or clean washcloth.  5.  Apply the CHG Soap to your body ONLY FROM THE NECK DOWN.   Do not use on face/ open                           Wound or open sores. Avoid contact with eyes, ears mouth and genitals (private parts).                       Wash face,  Genitals (private parts) with your normal soap.             6.  Wash thoroughly, paying special attention to the area where your surgery  will be performed.  7.  Thoroughly rinse your body with warm water from the neck down.  8.  DO NOT shower/wash with your normal soap after using and rinsing off  the CHG Soap.                9.  Pat yourself dry with a clean towel.            10.  Wear clean pajamas.            11.  Place clean sheets on your bed the night of your first shower and do not  sleep with pets. Day of Surgery : Do not apply any lotions/deodorants the morning of surgery.  Please wear clean clothes to the hospital/surgery center.  FAILURE TO FOLLOW THESE INSTRUCTIONS MAY RESULT IN THE CANCELLATION OF YOUR SURGERY PATIENT SIGNATURE_________________________________  NURSE SIGNATURE__________________________________  ________________________________________________________________________

## 2019-01-04 ENCOUNTER — Encounter (HOSPITAL_COMMUNITY): Payer: Self-pay

## 2019-01-04 ENCOUNTER — Encounter (HOSPITAL_COMMUNITY)
Admission: RE | Admit: 2019-01-04 | Discharge: 2019-01-04 | Disposition: A | Discharge status: Home / Self Care | Payer: Medicare Other | Source: Ambulatory Visit | Attending: Urology | Admitting: Urology

## 2019-01-04 ENCOUNTER — Other Ambulatory Visit: Payer: Self-pay

## 2019-01-04 DIAGNOSIS — N200 Calculus of kidney: Secondary | ICD-10-CM | POA: Diagnosis present

## 2019-01-04 DIAGNOSIS — Z87442 Personal history of urinary calculi: Secondary | ICD-10-CM | POA: Diagnosis not present

## 2019-01-04 DIAGNOSIS — C801 Malignant (primary) neoplasm, unspecified: Secondary | ICD-10-CM | POA: Diagnosis not present

## 2019-01-04 DIAGNOSIS — R9431 Abnormal electrocardiogram [ECG] [EKG]: Secondary | ICD-10-CM | POA: Insufficient documentation

## 2019-01-04 DIAGNOSIS — Z8249 Family history of ischemic heart disease and other diseases of the circulatory system: Secondary | ICD-10-CM | POA: Diagnosis not present

## 2019-01-04 DIAGNOSIS — E538 Deficiency of other specified B group vitamins: Secondary | ICD-10-CM | POA: Diagnosis not present

## 2019-01-04 DIAGNOSIS — Z01818 Encounter for other preprocedural examination: Secondary | ICD-10-CM

## 2019-01-04 DIAGNOSIS — Z9842 Cataract extraction status, left eye: Secondary | ICD-10-CM | POA: Diagnosis not present

## 2019-01-04 DIAGNOSIS — K219 Gastro-esophageal reflux disease without esophagitis: Secondary | ICD-10-CM | POA: Diagnosis not present

## 2019-01-04 DIAGNOSIS — I2510 Atherosclerotic heart disease of native coronary artery without angina pectoris: Secondary | ICD-10-CM | POA: Diagnosis not present

## 2019-01-04 DIAGNOSIS — I499 Cardiac arrhythmia, unspecified: Secondary | ICD-10-CM

## 2019-01-04 DIAGNOSIS — Z803 Family history of malignant neoplasm of breast: Secondary | ICD-10-CM | POA: Diagnosis not present

## 2019-01-04 DIAGNOSIS — Z85828 Personal history of other malignant neoplasm of skin: Secondary | ICD-10-CM | POA: Diagnosis not present

## 2019-01-04 DIAGNOSIS — M1990 Unspecified osteoarthritis, unspecified site: Secondary | ICD-10-CM | POA: Diagnosis not present

## 2019-01-04 DIAGNOSIS — N202 Calculus of kidney with calculus of ureter: Secondary | ICD-10-CM | POA: Diagnosis not present

## 2019-01-04 DIAGNOSIS — N179 Acute kidney failure, unspecified: Secondary | ICD-10-CM | POA: Diagnosis not present

## 2019-01-04 DIAGNOSIS — Z9841 Cataract extraction status, right eye: Secondary | ICD-10-CM | POA: Diagnosis not present

## 2019-01-04 DIAGNOSIS — E785 Hyperlipidemia, unspecified: Secondary | ICD-10-CM | POA: Diagnosis not present

## 2019-01-04 DIAGNOSIS — Z9049 Acquired absence of other specified parts of digestive tract: Secondary | ICD-10-CM | POA: Diagnosis not present

## 2019-01-04 DIAGNOSIS — Z90710 Acquired absence of both cervix and uterus: Secondary | ICD-10-CM | POA: Diagnosis not present

## 2019-01-04 DIAGNOSIS — I10 Essential (primary) hypertension: Secondary | ICD-10-CM | POA: Diagnosis not present

## 2019-01-04 DIAGNOSIS — Z853 Personal history of malignant neoplasm of breast: Secondary | ICD-10-CM | POA: Diagnosis not present

## 2019-01-04 HISTORY — DX: Unspecified osteoarthritis, unspecified site: M19.90

## 2019-01-04 LAB — CBC
HCT: 38.8 % (ref 36.0–46.0)
HEMOGLOBIN: 12.2 g/dL (ref 12.0–15.0)
MCH: 31.3 pg (ref 26.0–34.0)
MCHC: 31.4 g/dL (ref 30.0–36.0)
MCV: 99.5 fL (ref 80.0–100.0)
Platelets: 519 10*3/uL — ABNORMAL HIGH (ref 150–400)
RBC: 3.9 MIL/uL (ref 3.87–5.11)
RDW: 13.7 % (ref 11.5–15.5)
WBC: 11.1 10*3/uL — ABNORMAL HIGH (ref 4.0–10.5)
nRBC: 0 % (ref 0.0–0.2)

## 2019-01-04 LAB — BASIC METABOLIC PANEL
Anion gap: 7 (ref 5–15)
BUN: 46 mg/dL — ABNORMAL HIGH (ref 8–23)
CO2: 20 mmol/L — ABNORMAL LOW (ref 22–32)
Calcium: 9.3 mg/dL (ref 8.9–10.3)
Chloride: 112 mmol/L — ABNORMAL HIGH (ref 98–111)
Creatinine, Ser: 2.58 mg/dL — ABNORMAL HIGH (ref 0.44–1.00)
GFR calc Af Amer: 19 mL/min — ABNORMAL LOW (ref >60)
GFR, EST NON AFRICAN AMERICAN: 17 mL/min — AB (ref >60)
GLUCOSE: 109 mg/dL — AB (ref 70–99)
Potassium: 5.4 mmol/L — ABNORMAL HIGH (ref 3.5–5.1)
Sodium: 139 mmol/L (ref 135–145)

## 2019-01-04 NOTE — Progress Notes (Signed)
Anesthesia Chart Review   Case:  [id number] Date/Time:  01/06/19 1430   Procedures:      CYSTOSCOPY WITH RETROGRADE PYELOGRAM, URETEROSCOPY AND STENT PLACEMENT (Bilateral ) - 2 MINS     HOLMIUM LASER APPLICATION (Bilateral )   Anesthesia type:  General   Pre-op diagnosis:  RIGHT URETERAL AND BILATERAL RENAL STONES   [facility]  [facility]OM 03 / [facility]   Surgeon:  Alison Frock, MD      DISCUSSION: 85 yo never smoker with h/o HTN, GERD, HLD, CAD, right breast cancer, right ureteral and bilateral renal stones scheduled for above procedure 01/06/19 with Dr. Alexis Powell.   Last seen by cardiologist, Dr. Daneen Powell, 04/28/18.  Per Dr. Thompson Powell note, "She is stable without angina or exertional limitations."  Pt can proceed with planned procedure barring acute status change.  VS: BP (!) 154/48   Pulse 67   Temp 36.7 C (Oral)   Resp 16   Ht 5\' 7"  (1.702 m)   Wt 63.6 kg   LMP 10/07/1974   SpO2 99%   BMI 21.96 kg/m   PROVIDERS: Alison Cha, MD is PCP   Alison Schick, MD is Cardiologist  LABS: Elevated creatinine most likely due to obstruction (all labs ordered are listed, but only abnormal results are displayed)  Labs Reviewed  BASIC METABOLIC PANEL - Abnormal; Notable for the following components:      Result Value   Potassium 5.4 (*)    Chloride 112 (*)    CO2 20 (*)    Glucose, Bld 109 (*)    BUN 46 (*)    Creatinine, Ser 2.58 (*)    GFR calc non Af Amer 17 (*)    GFR calc Af Amer 19 (*)    All other components within normal limits  CBC - Abnormal; Notable for the following components:   WBC 11.1 (*)    Platelets 519 (*)    All other components within normal limits     IMAGES: VAS US Carotid 02/19/19 Final Interpretation: Right Carotid: Velocities in the right ICA are consistent with a 1-39% stenosis.                Non-hemodynamically significant plaque <50% noted in the CCA.  Left Carotid: Velocities in the left ICA are consistent with a 1-39%  stenosis.               Hemodynamically significant plaque >50% visualized in the CCA.  Vertebrals:  Bilateral vertebral arteries demonstrate antegrade flow. Subclavians: Normal flow hemodynamics were seen in bilateral subclavian              arteries.  EKG: 01/04/19 Rate 67 bpm Sinus rhythm with marked sinus arrhythmia Septal infarct, age undetermined  CV:  Past Medical History:  Diagnosis Date  . Arthritis    HANDS AND BACK  . 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 Breast cancer ([place]) 2010   RIGHT  . CAD (coronary artery disease)   . Dysuria   . GERD (gastroesophageal reflux disease)    HX OF  . Heart murmur   . History of breast cancer ONCOLOGIST-- DR [name]   DX 2010--  RIGHT BREAST DCIS  HIGH GRADE S/P LUMPECTOMY W/ SLN BX--  NO RECURRENCE  . 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 History of skin cancer  excision basal cell  . Hyperlipidemia   . Hypertension   . Kidney stones 7/14  . Nocturia   . Ovarian carcinoma ([place])    papillary serous-low grade, recurrence found in fat necrosis during ileostomy  . Renal stones 2014  . Shingles 30 YRS AGO  . Ureteral calculi    bilateral    Past Surgical History:  Procedure Laterality Date  . APPENDECTOMY    . BREAST LUMPECTOMY Right 04-13-2009   W/ SLN BX  . CARDIAC CATHETERIZATION  11-11-2005 DR [name]   MODERATE LAD DISEASE/ NORMAL LVF/ EF 65-75%  . CATARACT EXTRACTION W/ INTRAOCULAR LENS IMPLANT Bilateral 04/26/2013  . CHOLECYSTECTOMY  1990   OPEN  . CYSTOSCOPY W/ URETERAL STENT PLACEMENT Bilateral 05/07/2013   Procedure: CYSTOSCOPY WITH STENT REPLACEMENTS;  Surgeon: Alison Frock, MD;  [facility];  Service: Urology;  Laterality: Bilateral;  . CYSTOSCOPY WITH BIOPSY N/A 04/07/2013   Procedure: CYSTOSCOPY WITH BIOPSY and  fulgeration;  Surgeon: Alison Frock, MD;  [facility];  Service: Urology;  Laterality: N/A;  . CYSTOSCOPY WITH RETROGRADE PYELOGRAM, URETEROSCOPY AND STENT PLACEMENT Bilateral 05/07/2013   Procedure: CYSTOSCOPY WITH RETROGRADE PYELOGRAM, URETEROSCOPY ;  Surgeon: Alison Frock, MD;  [facility];  Service: Urology;  Laterality: Bilateral;  . CYSTOSCOPY WITH STENT PLACEMENT Bilateral 04/07/2013   Procedure: CYSTOSCOPY WITH STENT PLACEMENT;  Surgeon: Alison Frock, MD;  [facility];  Service: Urology;  Laterality: Bilateral;  . DX LAPAROSCOPY W/ PERITONEAL AND OMENTAL BX'S AND WASHINGS  02-16-2008  . EXCISION RIGHT BREAST MASS  02-10-2002  . EXP. LAP. EXTENSIVE ADHESIOLYSIS/ RESECTION TERMINAL ILEUM , ASCENDING AND DESCENDING COLON WITH CREATION ILEOSTOMY AND MUCOUS FISTULA  02-19-2008   PERFERATION AND ABD. CANCER--  TAKEDOWN ILEOSTOMY 08-23-2008  . TOTAL ABDOMINAL HYSTERECTOMY W/ BILATERAL SALPINGOOPHORECTOMY  1976  . TOTAL ABDOMINAL HYSTERECTOMY W/ BILATERAL SALPINGOOPHORECTOMY  1976  . VULVAR LESION REMOVAL N/A 08/22/2014   Procedure: EXCISION OF VULVAR CYST  ;  Surgeon: Alison Speller, MD;  Location: [facility];  Service: Gynecology;  Laterality: N/A;  . WOUND DEBRIDEMENT  05/28/2012   Procedure: DEBRIDEMENT ABDOMINAL WOUND;  Surgeon: Alison Lasso, MD;  Location: [facility];  Service: General;  Laterality: N/A;  excision chronic wound abdominal wall    MEDICATIONS: . amLODipine (NORVASC) 10 MG tablet  . aspirin EC 81 MG tablet  . Biotin 10000 MCG TABS  . Calcium Carbonate-Vitamin D (CALTRATE 600+D PO)  . carvedilol (COREG) 25 MG tablet  . Cholecalciferol (VITAMIN D3) 50 MCG (2000 UT) TABS  . cloNIDine (CATAPRES) 0.1 MG tablet  . ibuprofen (ADVIL) 200 MG tablet  . Multiple Vitamin (MULTIVITAMIN WITH MINERALS) TABS tablet  . olmesartan (BENICAR) 40 MG tablet  . PROCTOZONE-HC 2.5 % rectal cream   No current facility-administered  medications for this encounter.     [name] [facility] Pre-Surgical Testing [phone] 01/04/19 2:43 PM

## 2019-01-04 NOTE — Anesthesia Preprocedure Evaluation (Addendum)
Anesthesia Evaluation  Patient identified by MRN, date of birth, ID band Patient awake    Reviewed: Allergy & Precautions, NPO status , Patient's Chart, lab work & pertinent test results, reviewed documented beta blocker date and time   Airway Mallampati: III  TM Distance: >3 FB Neck ROM: Full    Dental no notable dental hx.    Pulmonary neg pulmonary ROS,    Pulmonary exam normal breath sounds clear to auscultation       Cardiovascular hypertension, Pt. on medications and Pt. on home beta blockers Normal cardiovascular exam Rhythm:Regular Rate:Normal  ECG: SR, rate 67  Pre-op eval per cardiology Tamala Julian)   Neuro/Psych negative neurological ROS  negative psych ROS   GI/Hepatic negative GI ROS, Neg liver ROS,   Endo/Other  negative endocrine ROS  Renal/GU CRFRenal disease     Musculoskeletal negative musculoskeletal ROS (+)   Abdominal   Peds  Hematology negative hematology ROS (+)   Anesthesia Other Findings RIGHT URETERAL AND BILATERAL RENAL STONES  Reproductive/Obstetrics                           Anesthesia Physical Anesthesia Plan  ASA: III  Anesthesia Plan: General   Post-op Pain Management:    Induction: Intravenous  PONV Risk Score and Plan: 3 and Ondansetron, Dexamethasone and Treatment may vary due to age or medical condition  Airway Management Planned: LMA  Additional Equipment:   Intra-op Plan:   Post-operative Plan: Extubation in OR  Informed Consent: I have reviewed the patients History and Physical, chart, labs and discussed the procedure including the risks, benefits and alternatives for the proposed anesthesia with the patient or authorized representative who has indicated his/her understanding and acceptance.     Dental advisory given  Plan Discussed with: CRNA  Anesthesia Plan Comments: (Reviewed PAT note from 01/04/19 by Konrad Felix, PA-C)       Anesthesia Quick Evaluation

## 2019-01-05 MED ORDER — GENTAMICIN SULFATE 40 MG/ML IJ SOLN
1.5000 mg/kg | INTRAVENOUS | Status: DC
  Filled 2019-01-05: qty 2.5

## 2019-01-05 NOTE — Progress Notes (Signed)
PCP:Dr. Kathi Der  CARDIOLOGIST:Dr. Daneen Schick last office visit 04/28/18  INFO IN Epic:CBC,BMET(ab nl)EKG:, 01/04/19  INFO ON CHART:  BLOOD THINNERS AND LAST DOSES:ASA held 5 days prior toDOS ____________________________________  PATIENT SYMPTOMS AT TIME OF PREOP:none

## 2019-01-06 ENCOUNTER — Other Ambulatory Visit: Payer: Self-pay

## 2019-01-06 ENCOUNTER — Encounter (HOSPITAL_COMMUNITY): Payer: Self-pay | Admitting: *Deleted

## 2019-01-06 ENCOUNTER — Ambulatory Visit (HOSPITAL_COMMUNITY): Payer: Medicare Other | Admitting: Anesthesiology

## 2019-01-06 ENCOUNTER — Ambulatory Visit (HOSPITAL_COMMUNITY): Payer: Medicare Other

## 2019-01-06 ENCOUNTER — Ambulatory Visit (HOSPITAL_COMMUNITY): Payer: Medicare Other | Admitting: Physician Assistant

## 2019-01-06 ENCOUNTER — Ambulatory Visit (HOSPITAL_COMMUNITY)
Admission: RE | Admit: 2019-01-06 | Discharge: 2019-01-06 | Disposition: A | Discharge status: Home / Self Care | Payer: Medicare Other | Attending: Urology | Admitting: Urology

## 2019-01-06 ENCOUNTER — Encounter (HOSPITAL_COMMUNITY)
Admission: RE | Disposition: A | Discharge status: Home / Self Care | Payer: Self-pay | Source: Home / Self Care | Attending: Urology

## 2019-01-06 DIAGNOSIS — Z853 Personal history of malignant neoplasm of breast: Secondary | ICD-10-CM | POA: Insufficient documentation

## 2019-01-06 DIAGNOSIS — N202 Calculus of kidney with calculus of ureter: Secondary | ICD-10-CM | POA: Diagnosis not present

## 2019-01-06 DIAGNOSIS — Z9049 Acquired absence of other specified parts of digestive tract: Secondary | ICD-10-CM | POA: Insufficient documentation

## 2019-01-06 DIAGNOSIS — C801 Malignant (primary) neoplasm, unspecified: Secondary | ICD-10-CM | POA: Insufficient documentation

## 2019-01-06 DIAGNOSIS — K219 Gastro-esophageal reflux disease without esophagitis: Secondary | ICD-10-CM | POA: Insufficient documentation

## 2019-01-06 DIAGNOSIS — Z87442 Personal history of urinary calculi: Secondary | ICD-10-CM | POA: Insufficient documentation

## 2019-01-06 DIAGNOSIS — M1990 Unspecified osteoarthritis, unspecified site: Secondary | ICD-10-CM | POA: Diagnosis not present

## 2019-01-06 DIAGNOSIS — Z9841 Cataract extraction status, right eye: Secondary | ICD-10-CM | POA: Insufficient documentation

## 2019-01-06 DIAGNOSIS — N179 Acute kidney failure, unspecified: Secondary | ICD-10-CM | POA: Diagnosis not present

## 2019-01-06 DIAGNOSIS — Z8249 Family history of ischemic heart disease and other diseases of the circulatory system: Secondary | ICD-10-CM | POA: Insufficient documentation

## 2019-01-06 DIAGNOSIS — Z90710 Acquired absence of both cervix and uterus: Secondary | ICD-10-CM | POA: Insufficient documentation

## 2019-01-06 DIAGNOSIS — Z9842 Cataract extraction status, left eye: Secondary | ICD-10-CM | POA: Insufficient documentation

## 2019-01-06 DIAGNOSIS — Z803 Family history of malignant neoplasm of breast: Secondary | ICD-10-CM | POA: Insufficient documentation

## 2019-01-06 DIAGNOSIS — E785 Hyperlipidemia, unspecified: Secondary | ICD-10-CM | POA: Diagnosis not present

## 2019-01-06 DIAGNOSIS — I2510 Atherosclerotic heart disease of native coronary artery without angina pectoris: Secondary | ICD-10-CM | POA: Insufficient documentation

## 2019-01-06 DIAGNOSIS — I10 Essential (primary) hypertension: Secondary | ICD-10-CM | POA: Diagnosis not present

## 2019-01-06 DIAGNOSIS — Z85828 Personal history of other malignant neoplasm of skin: Secondary | ICD-10-CM | POA: Insufficient documentation

## 2019-01-06 HISTORY — PX: CYSTOSCOPY WITH RETROGRADE PYELOGRAM, URETEROSCOPY AND STENT PLACEMENT: SHX5789

## 2019-01-06 HISTORY — PX: HOLMIUM LASER APPLICATION: SHX5852

## 2019-01-06 LAB — BASIC METABOLIC PANEL
Anion gap: 8 (ref 5–15)
BUN: 50 mg/dL — ABNORMAL HIGH (ref 8–23)
CO2: 21 mmol/L — ABNORMAL LOW (ref 22–32)
Calcium: 9.4 mg/dL (ref 8.9–10.3)
Chloride: 108 mmol/L (ref 98–111)
Creatinine, Ser: 2.85 mg/dL — ABNORMAL HIGH (ref 0.44–1.00)
GFR calc Af Amer: 17 mL/min — ABNORMAL LOW (ref >60)
GFR calc non Af Amer: 15 mL/min — ABNORMAL LOW (ref >60)
Glucose, Bld: 110 mg/dL — ABNORMAL HIGH (ref 70–99)
Potassium: 5 mmol/L (ref 3.5–5.1)
Sodium: 137 mmol/L (ref 135–145)

## 2019-01-06 SURGERY — CYSTOURETEROSCOPY, WITH RETROGRADE PYELOGRAM AND STENT INSERTION
Anesthesia: General | Site: Bladder | Laterality: Bilateral

## 2019-01-06 MED ORDER — FENTANYL CITRATE (PF) 100 MCG/2ML IJ SOLN
INTRAMUSCULAR | Status: DC | PRN
  Administered 2019-01-06 (×2): 25 ug via INTRAVENOUS

## 2019-01-06 MED ORDER — SODIUM CHLORIDE 0.9 % IV SOLN: 1.0000 g | Freq: Once | INTRAVENOUS | Status: DC

## 2019-01-06 MED ORDER — ACETAMINOPHEN 500 MG PO TABS
1000.0000 mg | ORAL_TABLET | Freq: Once | ORAL | Status: AC
  Administered 2019-01-06: 13:00:00 1000 mg via ORAL
  Filled 2019-01-06: qty 2

## 2019-01-06 MED ORDER — 0.9 % SODIUM CHLORIDE (POUR BTL) OPTIME
TOPICAL | Status: DC | PRN
  Administered 2019-01-06: 1000 mL

## 2019-01-06 MED ORDER — FENTANYL CITRATE (PF) 100 MCG/2ML IJ SOLN
INTRAMUSCULAR | Status: AC
  Filled 2019-01-06: qty 2

## 2019-01-06 MED ORDER — ONDANSETRON HCL 4 MG/2ML IJ SOLN
INTRAMUSCULAR | Status: DC | PRN
  Administered 2019-01-06: 4 mg via INTRAVENOUS

## 2019-01-06 MED ORDER — EPHEDRINE SULFATE-NACL 50-0.9 MG/10ML-% IV SOSY
PREFILLED_SYRINGE | INTRAVENOUS | Status: DC | PRN
  Administered 2019-01-06 (×5): 10 mg via INTRAVENOUS

## 2019-01-06 MED ORDER — SODIUM CHLORIDE 0.9 % IV SOLN: 2.0000 g | Freq: Once | INTRAVENOUS | Status: DC

## 2019-01-06 MED ORDER — ONDANSETRON HCL 4 MG/2ML IJ SOLN: 4.0000 mg | Freq: Once | INTRAMUSCULAR | Status: DC | PRN

## 2019-01-06 MED ORDER — FENTANYL CITRATE (PF) 100 MCG/2ML IJ SOLN: 25.0000 ug | INTRAMUSCULAR | Status: DC | PRN

## 2019-01-06 MED ORDER — SODIUM CHLORIDE 0.9 % IR SOLN
Status: DC | PRN
  Administered 2019-01-06 (×2): 3000 mL

## 2019-01-06 MED ORDER — SODIUM CHLORIDE 0.9 % IV SOLN
INTRAVENOUS | Status: DC | PRN
  Administered 2019-01-06: 37 mL

## 2019-01-06 MED ORDER — PROPOFOL 10 MG/ML IV BOLUS
INTRAVENOUS | Status: AC
  Filled 2019-01-06: qty 20

## 2019-01-06 MED ORDER — TRAMADOL HCL 50 MG PO TABS: 50.0000 mg | ORAL_TABLET | Freq: Four times a day (QID) | ORAL | 0 refills | Status: DC | PRN

## 2019-01-06 MED ORDER — DEXAMETHASONE SODIUM PHOSPHATE 10 MG/ML IJ SOLN
INTRAMUSCULAR | Status: DC | PRN
  Administered 2019-01-06: 5 mg via INTRAVENOUS

## 2019-01-06 MED ORDER — PROPOFOL 10 MG/ML IV BOLUS
INTRAVENOUS | Status: DC | PRN
  Administered 2019-01-06: 200 mg via INTRAVENOUS

## 2019-01-06 MED ORDER — DEXAMETHASONE SODIUM PHOSPHATE 10 MG/ML IJ SOLN
INTRAMUSCULAR | Status: AC
  Filled 2019-01-06: qty 1

## 2019-01-06 MED ORDER — SODIUM CHLORIDE 0.9 % IV SOLN
INTRAVENOUS | Status: AC
  Filled 2019-01-06: qty 20

## 2019-01-06 MED ORDER — SODIUM CHLORIDE 0.9 % IV SOLN
1.0000 g | Freq: Once | INTRAVENOUS | Status: AC
  Administered 2019-01-06: 15:00:00 1 g via INTRAVENOUS
  Filled 2019-01-06: qty 1

## 2019-01-06 MED ORDER — LIDOCAINE 2% (20 MG/ML) 5 ML SYRINGE
INTRAMUSCULAR | Status: DC | PRN
  Administered 2019-01-06: 60 mg via INTRAVENOUS

## 2019-01-06 MED ORDER — SODIUM CHLORIDE 0.9 % IV SOLN
INTRAVENOUS | Status: DC
  Administered 2019-01-06: 13:00:00 via INTRAVENOUS

## 2019-01-06 MED ORDER — ONDANSETRON HCL 4 MG/2ML IJ SOLN
INTRAMUSCULAR | Status: AC
  Filled 2019-01-06: qty 2

## 2019-01-06 SURGICAL SUPPLY — 27 items
BAG URO CATCHER STRL LF (MISCELLANEOUS) ×3 IMPLANT
BASKET LASER NITINOL 1.9FR (BASKET) IMPLANT
CATH INTERMIT  6FR 70CM (CATHETERS) ×3 IMPLANT
CLOTH BEACON ORANGE TIMEOUT ST (SAFETY) ×3 IMPLANT
COVER SURGICAL LIGHT HANDLE (MISCELLANEOUS) ×3 IMPLANT
COVER WAND RF STERILE (DRAPES) IMPLANT
EXTRACTOR STONE 1.7FRX115CM (UROLOGICAL SUPPLIES) IMPLANT
FIBER LASER FLEXIVA 1000 (UROLOGICAL SUPPLIES) IMPLANT
FIBER LASER FLEXIVA 365 (UROLOGICAL SUPPLIES) IMPLANT
FIBER LASER FLEXIVA 550 (UROLOGICAL SUPPLIES) IMPLANT
FIBER LASER TRAC TIP (UROLOGICAL SUPPLIES) ×3 IMPLANT
GLOVE BIOGEL M STRL SZ7.5 (GLOVE) ×3 IMPLANT
GOWN STRL REUS W/TWL LRG LVL3 (GOWN DISPOSABLE) ×6 IMPLANT
GUIDEWIRE ANG ZIPWIRE 038X150 (WIRE) ×3 IMPLANT
GUIDEWIRE STR DUAL SENSOR (WIRE) ×6 IMPLANT
IV NS 1000ML (IV SOLUTION) ×2
IV NS 1000ML BAXH (IV SOLUTION) ×1 IMPLANT
KIT TURNOVER KIT A (KITS) IMPLANT
MANIFOLD NEPTUNE II (INSTRUMENTS) ×3 IMPLANT
PACK CYSTO (CUSTOM PROCEDURE TRAY) ×3 IMPLANT
SHEATH URETERAL 12FRX28CM (UROLOGICAL SUPPLIES) IMPLANT
SHEATH URETERAL 12FRX35CM (MISCELLANEOUS) IMPLANT
STENT POLARIS 5FRX22 (STENTS) ×3 IMPLANT
SYR CONTROL 10ML LL (SYRINGE) ×3 IMPLANT
TUBE FEEDING 8FR 16IN STR KANG (MISCELLANEOUS) ×3 IMPLANT
TUBING CONNECTING 10 (TUBING) ×2 IMPLANT
TUBING CONNECTING 10' (TUBING) ×1

## 2019-01-06 NOTE — Transfer of Care (Signed)
Immediate Anesthesia Transfer of Care Note  Patient: [name]  Procedure(s) Performed: CYSTOSCOPY WITH RETROGRADE PYELOGRAM, URETEROSCOPY AND STENT PLACEMENT, FIRST STAGE (Bilateral Bladder) HOLMIUM LASER APPLICATION (Bilateral Bladder)  Patient Location: PACU  Anesthesia Type:General  Level of Consciousness: awake, alert  and patient cooperative  Airway & Oxygen Therapy: Patient Spontanous Breathing and Patient connected to face mask oxygen  Post-op Assessment: Report given to RN and Post -op Vital signs reviewed and stable  Post vital signs: Reviewed and stable  Last Vitals:  Vitals Value Taken Time  BP 153/52 01/06/2019  4:15 PM  Temp    Pulse 66 01/06/2019  4:18 PM  Resp 17 01/06/2019  4:18 PM  SpO2 95 % 01/06/2019  4:18 PM  Vitals shown include unvalidated device data.  Last Pain:  Vitals:   01/06/19 1250  TempSrc: Oral         Complications: No apparent anesthesia complications

## 2019-01-06 NOTE — Discharge Instructions (Signed)
1 - You may have urinary urgency (bladder spasms) and bloody urine on / off with stent in place. This is normal. ° °2 - Call MD or go to ER for fever >102, severe pain / nausea / vomiting not relieved by medications, or acute change in medical status ° °

## 2019-01-06 NOTE — Anesthesia Procedure Notes (Signed)
Procedure Name: LMA Insertion Date/Time: 01/06/2019 2:46 PM Performed by: West Pugh, CRNA Pre-anesthesia Checklist: Patient identified, Emergency Drugs available, Suction available, Patient being monitored and Timeout performed Patient Re-evaluated:Patient Re-evaluated prior to induction Oxygen Delivery Method: Circle system utilized Preoxygenation: Pre-oxygenation with 100% oxygen Induction Type: IV induction LMA: LMA inserted LMA Size: 4.0 Placement Confirmation: positive ETCO2 Tube secured with: Tape Dental Injury: Teeth and Oropharynx as per pre-operative assessment

## 2019-01-06 NOTE — Brief Op Note (Signed)
01/06/2019  4:05 PM  PATIENT:  [name]  85 y.o. female  PRE-OPERATIVE DIAGNOSIS:  RIGHT URETERAL AND BILATERAL RENAL STONES  POST-OPERATIVE DIAGNOSIS:  RIGHT URETERAL AND BILATERAL RENAL STONES  PROCEDURE:  Procedure(s): CYSTOSCOPY WITH RETROGRADE PYELOGRAM, URETEROSCOPY AND STENT PLACEMENT, FIRST STAGE (Bilateral) HOLMIUM LASER APPLICATION (Bilateral)  SURGEON:  Surgeon(s) and Role:    Alexis Frock, MD - Primary  PHYSICIAN ASSISTANT:   ASSISTANTS: none   ANESTHESIA:   general  EBL:  minimal   BLOOD ADMINISTERED:none  DRAINS: none   LOCAL MEDICATIONS USED:  NONE  SPECIMEN:  No Specimen  DISPOSITION OF SPECIMEN:  N/A  COUNTS:  YES  TOURNIQUET:  * No tourniquets in log *  DICTATION: .Other Dictation: Dictation Number  [id number]  PLAN OF CARE: Discharge to home after PACU  PATIENT DISPOSITION:  PACU - hemodynamically stable.   Delay start of Pharmacological VTE agent (>24hrs) due to surgical blood loss or risk of bleeding: not applicable

## 2019-01-06 NOTE — Anesthesia Postprocedure Evaluation (Signed)
Anesthesia Post Note  Patient: [name]  Procedure(s) Performed: CYSTOSCOPY WITH RETROGRADE PYELOGRAM, URETEROSCOPY AND STENT PLACEMENT, FIRST STAGE (Bilateral Bladder) HOLMIUM LASER APPLICATION (Bilateral Bladder)     Patient location during evaluation: PACU Anesthesia Type: General Level of consciousness: awake and alert Pain management: pain level controlled Vital Signs Assessment: post-procedure vital signs reviewed and stable Respiratory status: spontaneous breathing, nonlabored ventilation, respiratory function stable and patient connected to nasal cannula oxygen Cardiovascular status: blood pressure returned to baseline and stable Postop Assessment: no apparent nausea or vomiting Anesthetic complications: no    Last Vitals:  Vitals:   01/06/19 1700 01/06/19 1711  BP: (!) 155/92 (!) 159/56  Pulse: 69 74  Resp: 20 18  Temp: (!) 36.3 C (!) 36.4 C  SpO2: 96% 93%    Last Pain:  Vitals:   01/06/19 1711  TempSrc:   PainSc: 0-No pain                 Canyon Lohr P Trude Cansler

## 2019-01-06 NOTE — H&P (Signed)
[name] is an 85 y.o. female.    Chief Complaint: Pre-Op BILATERAL Ureteroscopic Stone Manipulation  HPI:   1 -Urolithiasis -  05/2013 - bilateral URS 05/07/13 for bilateral uretral stones to stone free.   12/2018 - abtou 1 week for colikcly right flank pain. Non-positional. NO fevers, NO hematuria, feels similar to prior stones. 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 CT stone with Rt 75mm distal stone with mod hydro (920HU) and about 4mm total volume non-obstructing stone each kidney.   2 - Acute Renal Failure - Cr 2.5 in setting of right ureteral stone 12/2018. No left hydro. Admits to some poor PO intake.    PMH sig for chole, hyst, colon resection, "slow growing" but progressive ovarian cancer folowed by Dr. Aldean Ast. Her PCP is Dr. Fara Olden with [facility].    Today [name] is seen to proceed with BILATERAL ureteroscopic stone manipulation. No interval fevers. Most recent UCX negative. K today 5.0.     Past Medical History:  Diagnosis Date  . Arthritis    HANDS AND BACK  . 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 Breast cancer ([place]) 2010   RIGHT  . CAD (coronary artery disease)   . Dysuria   . GERD (gastroesophageal reflux disease)    HX OF  . Heart murmur   . History of breast cancer ONCOLOGIST-- DR Benay Spice   DX 2010--  RIGHT BREAST DCIS  HIGH GRADE S/P LUMPECTOMY W/ SLN BX--  NO RECURRENCE  . 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 History of skin cancer    excision basal cell  . Hyperlipidemia   . Hypertension   . Kidney stones 7/14  . Nocturia   . 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 Renal stones 2014  . Shingles 30 YRS AGO  . Ureteral calculi    bilateral    Past Surgical History:  Procedure Laterality Date  . APPENDECTOMY    .  BREAST LUMPECTOMY Right 04-13-2009   W/ SLN BX  . CARDIAC CATHETERIZATION  11-11-2005 DR Daneen Schick   MODERATE LAD DISEASE/ NORMAL LVF/ EF 65-75%  . CATARACT EXTRACTION W/ INTRAOCULAR LENS IMPLANT Bilateral 04/26/2013  . CHOLECYSTECTOMY  1990   OPEN  . CYSTOSCOPY W/ URETERAL STENT PLACEMENT Bilateral 05/07/2013   Procedure: CYSTOSCOPY WITH STENT REPLACEMENTS;  Surgeon: Alexis Frock, MD;  Location: [facility];  Service: Urology;  Laterality: Bilateral;  . CYSTOSCOPY WITH BIOPSY N/A 04/07/2013   Procedure: CYSTOSCOPY WITH BIOPSY and fulgeration;  Surgeon: Alexis Frock, MD;  Location: [facility];  Service: Urology;  Laterality: N/A;  . CYSTOSCOPY WITH RETROGRADE PYELOGRAM, URETEROSCOPY AND STENT PLACEMENT Bilateral 05/07/2013   Procedure: CYSTOSCOPY WITH RETROGRADE PYELOGRAM, URETEROSCOPY ;  Surgeon: Alexis Frock, MD;  Location: [facility];  Service: Urology;  Laterality: Bilateral;  . CYSTOSCOPY WITH STENT PLACEMENT Bilateral 04/07/2013   Procedure: CYSTOSCOPY WITH STENT PLACEMENT;  Surgeon: Alexis Frock, MD;  Location: [facility];  Service: Urology;  Laterality: Bilateral;  . DX LAPAROSCOPY W/ PERITONEAL AND OMENTAL BX'S AND WASHINGS  02-16-2008  . EXCISION RIGHT BREAST MASS  02-10-2002  . EXP. LAP. EXTENSIVE ADHESIOLYSIS/ RESECTION TERMINAL ILEUM , ASCENDING AND DESCENDING COLON WITH CREATION ILEOSTOMY AND MUCOUS FISTULA  02-19-2008   PERFERATION AND ABD. CANCER--  TAKEDOWN ILEOSTOMY 08-23-2008  .  TOTAL ABDOMINAL HYSTERECTOMY W/ BILATERAL SALPINGOOPHORECTOMY  1976  . TOTAL ABDOMINAL HYSTERECTOMY W/ BILATERAL SALPINGOOPHORECTOMY  1976  . VULVAR LESION REMOVAL N/A 08/22/2014   Procedure: EXCISION OF VULVAR CYST  ;  Surgeon: Lyman Speller, MD;  Location: [facility];  Service: Gynecology;  Laterality: N/A;  . WOUND DEBRIDEMENT  05/28/2012   Procedure: DEBRIDEMENT ABDOMINAL WOUND;  Surgeon: Haywood Lasso, MD;  Location: [facility];  Service: General;   Laterality: N/A;  excision chronic wound abdominal wall    Family History  Problem Relation Age of Onset  . Heart disease Mother   . Hyperlipidemia Mother   . Breast cancer Sister   . Cancer Sister        breast   Social History:  reports that she has never smoked. She has never used smokeless tobacco. She reports that she does not drink alcohol or use drugs.  Allergies: No Known Allergies  No medications prior to admission.    Results for orders placed or performed during the hospital encounter of 01/04/19 (from the past 48 hour(s))  Basic metabolic panel     Status: Abnormal   Collection Time: 01/04/19 11:10 AM  Result Value Ref Range   Sodium 139 135 - 145 mmol/L   Potassium 5.4 (H) 3.5 - 5.1 mmol/L   Chloride 112 (H) 98 - 111 mmol/L   CO2 20 (L) 22 - 32 mmol/L   Glucose, Bld 109 (H) 70 - 99 mg/dL   BUN 46 (H) 8 - 23 mg/dL   Creatinine, Ser 2.58 (H) 0.44 - 1.00 mg/dL   Calcium 9.3 8.9 - 10.3 mg/dL   GFR calc non Af Amer 17 (L) >60 mL/min   GFR calc Af Amer 19 (L) >60 mL/min   Anion gap 7 5 - 15    Comment: Performed at [facility], [place] [address], [place], [place] [address]  CBC     Status: Abnormal   Collection Time: 01/04/19 11:10 AM  Result Value Ref Range   WBC 11.1 (H) 4.0 - 10.5 K/uL   RBC 3.90 3.87 - 5.11 MIL/uL   Hemoglobin 12.2 12.0 - 15.0 g/dL   HCT 38.8 36.0 - 46.0 %   MCV 99.5 80.0 - 100.0 fL   MCH 31.3 26.0 - 34.0 pg   MCHC 31.4 30.0 - 36.0 g/dL   RDW 13.7 11.5 - 15.5 %   Platelets 519 (H) 150 - 400 K/uL   nRBC 0.0 0.0 - 0.2 %    Comment: Performed at [facility], [place] [address], [place], [place] [address]   No results found.  Review of Systems  Constitutional: Negative.  Negative for chills and fever.  HENT: Negative.   Eyes: Negative.   Respiratory: Negative.   Cardiovascular: Negative.   Gastrointestinal: Positive for nausea.  Genitourinary: Positive for flank pain.  Skin: Negative.   Neurological:  Negative.   Endo/Heme/Allergies: Negative.   Psychiatric/Behavioral: Negative.     Last menstrual period 10/07/1974. Physical Exam  Constitutional: She appears well-developed.  HENT:  Head: Normocephalic.  Eyes: Pupils are equal, round, and reactive to light.  Neck: Normal range of motion.  Cardiovascular: Normal rate.  Respiratory: Effort normal.  GI: Soft.  Genitourinary:    Genitourinary Comments: Mild Rt CVAT at present.    Musculoskeletal: Normal range of motion.  Neurological: She is alert.  Skin: Skin is warm.  Psychiatric: She has a normal mood and affect.     Assessment/Plan  1 -Urolithiasis -  Proceed as planned with BILATERAL ureteroscopic stone manipulation. Risks, benefits, alternatives, expected peri-op course discussed previously and reiterated today. Low threshold for staged approach with stent only if infectious parameters arise or progressive metabolic problems from ARF.   2 - Acute Renal Failure - renal decompression from stones as per above. Will plan on bilateral peri-op stents.   Alexis Frock, MD 01/06/2019, 5:31 AM

## 2019-01-07 ENCOUNTER — Encounter (HOSPITAL_COMMUNITY): Payer: Self-pay | Admitting: Urology

## 2019-01-07 ENCOUNTER — Other Ambulatory Visit: Payer: Self-pay | Admitting: Urology

## 2019-01-07 NOTE — Op Note (Signed)
NAME: [name], [name] MEDICAL RECORD [id number] ACCOUNT [id number] DATE OF BIRTH:04-02-1934 FACILITY[facility] LOCATION: [facility] PHYSICIAN:Erminio Nygard, MD  OPERATIVE REPORT  DATE OF PROCEDURE:  01/06/2019  PREOPERATIVE DIAGNOSIS:  Right ureteral bilateral renal stones, acute renal failure.  PROCEDURE: 1.  First-stage cystoscopy, bilateral pyelograms, interpretation. 2.  Bilateral ureteroscopy with laser lithotripsy. 3.  Insertion of bilateral ureteral stents 5 x 23 Polaris with tether.  SURGEON:  Alexis Frock, MD  FINDINGS: 1.  Left proximal ureteral tortuosity and multifocal left intrarenal stones. 2.  Successful placement of left ureteral stent proximal renal pelvis, distal in urinary bladder. 3.  Severely impacted right distal ureteral stone at the area of the iliac crossing. 4.  Successful attainment of continuity of the right ureter following laser lithotripsy, right side. 5.  Displaced right ureteral stent proximal renal pelvis, distal in urinary bladder.  INDICATIONS:  The patient is a very pleasant 85 year old lady with history of recurrent urolithiasis, found on workup of dull flank pain on the right to have right distal ureteral stone as well as bilateral renal stones.  Her right ureteral stone was  quite large in size at over a centimeter.  She also had bilateral intrarenal stones.  Options were discussed for management including recommended path of bilateral ureteroscopic stimulation with goal of stone free, and she wished to proceed.   Preoperative labs revealed acute renal failure with a creatinine ranging from 2.5-2.8 as well as borderline hyperkalemia.  Given this, we felt the safest way to proceed would be to place bilateral stents today to allow for renal decompression and resume  normal electrolytes before proceeding with laser lithotripsy.  Informed consent was obtained and then placed in medical record.  PROCEDURE IN DETAIL:  The patient being identified,  the procedure being bilateral ureteroscopic stimulation versus stenting alone was confirmed.  Procedure timeout was performed.  Antibiotics were administered.  General LMA anesthesia was induced.  The  patient was placed into a low lithotomy position.  A sterile field was created by prepping and draping the patient's vagina, introitus and proximal thighs using iodine.  Cystourethroscopy was performed with a 22-French rigid cystoscope with offset lens.   Inspection of the bladder revealed no diverticula, calcifications or papillary lesions.  Ureteral orifices were singleton bilaterally.  The right ureteral orifice was cannulated with a 6-French renal catheter, and right retrograde pyelogram was  obtained.  Right retrograde pyelogram revealed a single right ureter with a filling defect that appeared to be completely obstructing without any flow of contrast above this.  Multiple attempts were made to pass an angled tip ZIPwire as well as a straight-tip  Sensor wire above this.  However, ____ angulations, I was unable to achieve ureteral continuity past the area of stone.  Therefore, a ZIPwire was just curled in the ureter below the stone.  Similarly, left retrograde pyelogram was obtained.  Left retrograde pyelogram demonstrated a single cystem left kidney.  There was some tortuosity in the proximal ureter.  A .038 ZIPwire was advanced to the lower pole and set aside as a safety wire.  An 8-French feeding tube was placed in the urinary bladder, pressure released,  and semi-rigid ureteroscopy was performed of the distal 4/5 of the left ureter alongside a separate Sensor working wire.  A semi-rigid ureteroscopy and using the Sensor wire as a leader, the tortuosity of the proximal ureter resolved.  Semi-rigid  ureteroscopy was performed of the distal right ureter alongside the Sensor working wire.  As expected, there was a  completely impacted ureteral stone that appeared to be at the level of the iliac crossing.   There was significant mucosal edema in the  area.  Given this appeared to be somewhat chronic, there was insufficient angulation with a semirigid scope to allow for direct visualization of the stone except for just the lateral edges.  As such, it was exchanged over a Sensor working wire for the  flexible digital ureteroscope, single channel type, which was able to visualize the stone somewhat better.  Again, there was significant mucosal edema in the area of the stone.  It was completely impacted.  The goal now was to break up the stone enough  to achieve ureteral continuity enough to place a stent.  As such, holmium laser lithotripsy was applied to the stone using a setting of 0.2 joules and 20 Hz. It was very carefully fragmented using a combination of dusting and fragmentation technique,  just enough to obtain a suitable ureteral continuity to allow passage of the stent proximal to this.  The ZIPwire was then advanced to the level of the upper pole, and further retrograde pyelography corroborated significant hydronephrosis on the right  side.  A 5 x 22 Polaris-type stent was then placed over the right safety wire using cystoscopic and fluoroscopic guidance.  Good proximal and distal planes were noted.  Similarly, a 5 x 22 Polaris stent was placed over the left side safety wire using  cystoscopic and fluoroscopic guidance.  Good proximal and distal planes were noted.  The bladder was entered per cystoscope.  Procedure was then terminated.  The patient tolerated the procedure well.  No immediate perioperative complications.  The  patient was taken to postanesthesia care in stable condition with plan for a second-stage procedure in approximately 2-3 weeks to allow resolution of tortuosity and edema and improvement in her GFR.  Her angiotensin blocker will be held, as will  high-potassium foods.  LN/NUANCE  D:01/06/2019 T:01/06/2019 JOB:[id number]

## 2019-01-18 ENCOUNTER — Encounter (HOSPITAL_COMMUNITY): Payer: Medicare Other

## 2019-01-20 NOTE — Progress Notes (Signed)
Anesthesia Chart Review   Case:  [id number] Date/Time:  01/27/19 1015   Procedures:      CYSTOSCOPY WITH RETROGRADE PYELOGRAM, URETEROSCOPY AND STENT PLACEMENT (Bilateral ) - 14 MINS     HOLMIUM LASER APPLICATION (Bilateral )   Anesthesia type:  General   Pre-op diagnosis:  BILATERAL RENAL STONES, ACUTE RENAL FAILURE   Location:  [facility] OR ROOM 10 / [facility]   Surgeon:  Alexis Frock, MD      DISCUSSION:85 yo never smoker with h/o HTN, GERD, HLD, CAD, right breast cancer, right ureteral and bilateral renal stones scheduled for above procedure 01/06/19 with Dr. Alexis Frock.   Last seen by cardiologist, Dr. Daneen Schick, 04/28/18.  Per Dr. Thompson Caul note, "She is stable without angina or exertional limitations."  Anesthesia records reviewed, cystospscopy and stent placement 01/06/2019 under general anesthesia with no anesthesia complications noted.    Pt can proceed with planned procedure barring acute status change and after evalaution DOS (same day workup). VS: LMP 10/07/1974   PROVIDERS: Leeroy Cha, MD  Daneen Schick, MD is Cardiologist  LABS: Labs DOS (all labs ordered are listed, but only abnormal results are displayed)  Labs Reviewed - No data to display   IMAGES: VAS US Carotid 02/19/19 Final Interpretation: Right Carotid: Velocities in the right ICA are consistent with a 1-39% stenosis. Non-hemodynamically significant plaque <50% noted in the CCA.  Left Carotid: Velocities in the left ICA are consistent with a 1-39% stenosis. Hemodynamically significant plaque >50% visualized in the CCA.  Vertebrals: Bilateral vertebral arteries demonstrate antegrade flow. Subclavians: Normal flow hemodynamics were seen in bilateral subclavian arteries.  EKG: 01/04/19 Rate 67 bpm Sinus rhythm with marked sinus arrhythmia Septal infarct, age undetermined  CV:  Past Medical History:  Diagnosis Date  . Arthritis    HANDS AND BACK   . 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 Breast cancer ([place]) 2010   RIGHT  . CAD (coronary artery disease)   . Dysuria   . GERD (gastroesophageal reflux disease)    HX OF  . Heart murmur   . History of breast cancer ONCOLOGIST-- DR Benay Spice   DX 2010--  RIGHT BREAST DCIS  HIGH GRADE S/P LUMPECTOMY W/ SLN BX--  NO RECURRENCE  . 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 History of skin cancer    excision basal cell  . Hyperlipidemia   . Hypertension   . Kidney stones 7/14  . Nocturia   . Ovarian carcinoma ([place])    papillary serous-low grade, recurrence found in fat necrosis during ileostomy  . Renal stones 2014  . Shingles 30 YRS AGO  . Ureteral calculi    bilateral    Past Surgical History:  Procedure Laterality Date  . APPENDECTOMY    . BREAST LUMPECTOMY Right 04-13-2009   W/ SLN BX  . CARDIAC CATHETERIZATION  11-11-2005 DR Daneen Schick   MODERATE LAD DISEASE/ NORMAL LVF/ EF 65-75%  . CATARACT EXTRACTION W/ INTRAOCULAR LENS IMPLANT Bilateral 04/26/2013  . CHOLECYSTECTOMY  1990   OPEN  . CYSTOSCOPY W/ URETERAL STENT PLACEMENT Bilateral 05/07/2013   Procedure: CYSTOSCOPY WITH STENT REPLACEMENTS;  Surgeon: Alexis Frock, MD;  Location: [facility];  Service: Urology;  Laterality: Bilateral;  . CYSTOSCOPY WITH BIOPSY N/A 04/07/2013   Procedure: CYSTOSCOPY WITH BIOPSY and fulgeration;  Surgeon: Alexis Frock, MD;  Location: [facility];  Service: Urology;  Laterality: N/A;  . CYSTOSCOPY WITH RETROGRADE PYELOGRAM, URETEROSCOPY AND STENT PLACEMENT Bilateral 05/07/2013   Procedure: CYSTOSCOPY WITH RETROGRADE PYELOGRAM, URETEROSCOPY ;  Surgeon: Alexis Frock, MD;  Location: [facility];  Service: Urology;  Laterality: Bilateral;  . CYSTOSCOPY WITH RETROGRADE PYELOGRAM, URETEROSCOPY AND STENT PLACEMENT  Bilateral 01/06/2019   Procedure: CYSTOSCOPY WITH RETROGRADE PYELOGRAM, URETEROSCOPY AND STENT PLACEMENT, FIRST STAGE;  Surgeon: Alexis Frock, MD;  [facility];  Service: Urology;  Laterality: Bilateral;  . CYSTOSCOPY WITH STENT PLACEMENT Bilateral 04/07/2013   Procedure: CYSTOSCOPY WITH STENT PLACEMENT;  Surgeon: Alexis Frock, MD;  [facility];  Service: Urology;  Laterality: Bilateral;  . DX LAPAROSCOPY W/ PERITONEAL AND OMENTAL BX'S AND WASHINGS  02-16-2008  . EXCISION RIGHT BREAST MASS  02-10-2002  . EXP. LAP. EXTENSIVE ADHESIOLYSIS/ RESECTION TERMINAL ILEUM , ASCENDING AND DESCENDING COLON WITH CREATION ILEOSTOMY AND MUCOUS FISTULA  02-19-2008   PERFERATION AND ABD. CANCER--  TAKEDOWN ILEOSTOMY 08-23-2008  . HOLMIUM LASER APPLICATION Bilateral 10/12/1094   Procedure: HOLMIUM LASER APPLICATION;  Surgeon: Alexis Frock, MD;  [facility]: [facility];  Service: Urology;  Laterality: Bilateral;  . TOTAL ABDOMINAL HYSTERECTOMY W/ BILATERAL SALPINGOOPHORECTOMY  1976  . TOTAL ABDOMINAL HYSTERECTOMY W/ BILATERAL SALPINGOOPHORECTOMY  1976  . VULVAR LESION REMOVAL N/A 08/22/2014   Procedure: EXCISION OF VULVAR CYST  ;  Surgeon: Lyman Speller, MD;  Location: [facility];  Service: Gynecology;  Laterality: N/A;  . WOUND DEBRIDEMENT  05/28/2012   Procedure: DEBRIDEMENT ABDOMINAL WOUND;  Surgeon: Haywood Lasso, MD;  Location: [facility];  Service: General;  Laterality: N/A;  excision chronic wound abdominal wall    MEDICATIONS: No current facility-administered medications for this encounter.    Marland Kitchen amLODipine (NORVASC) 10 MG tablet  . aspirin EC 81 MG tablet  . Biotin 10000 MCG TABS  . Calcium Carbonate-Vitamin D (CALTRATE 600+D PO)  . carvedilol (COREG) 25 MG tablet  . Cholecalciferol (VITAMIN D3) 50 MCG (2000 UT) TABS  . cloNIDine (CATAPRES) 0.1 MG tablet  . PROCTOZONE-HC 2.5 % rectal cream  . traMADol (ULTRAM) 50 MG tablet   [name] [facility] Pre[facility]Surgical  Testing [phone] 01/20/19 2:10 PM

## 2019-01-20 NOTE — Anesthesia Preprocedure Evaluation (Addendum)
Anesthesia Evaluation  Patient identified by MRN, date of birth, ID band Patient awake    Reviewed: Allergy & Precautions, NPO status , Patient's Chart, lab work & pertinent test results, reviewed documented beta blocker date and time   History of Anesthesia Complications Negative for: history of anesthetic complications  Airway Mallampati: II  TM Distance: >3 FB Neck ROM: Full    Dental  (+) Dental Advisory Given, Teeth Intact   Pulmonary neg pulmonary ROS,    breath sounds clear to auscultation       Cardiovascular Exercise Tolerance: Good hypertension, Pt. on medications and Pt. on home beta blockers + CAD   Rhythm:Regular Rate:Normal   '19 Carotid US - 1-39% ICAS b/l    Neuro/Psych negative neurological ROS  negative psych ROS   GI/Hepatic Neg liver ROS, GERD  Controlled,  Endo/Other  negative endocrine ROS  Renal/GU Renal Insufficiency Nephrolithiasis      Musculoskeletal  (+) Arthritis ,   Abdominal   Peds  Hematology negative hematology ROS (+)   Anesthesia Other Findings   Reproductive/Obstetrics  Hx breast and ovarian cancer                                                             Anesthesia Evaluation  Patient identified by MRN, date of birth, ID band Patient awake    Reviewed: Allergy & Precautions, NPO status , Patient's Chart, lab work & pertinent test results, reviewed documented beta blocker date and time   Airway Mallampati: III  TM Distance: >3 FB Neck ROM: Full    Dental no notable dental hx.    Pulmonary neg pulmonary ROS,    Pulmonary exam normal breath sounds clear to auscultation       Cardiovascular hypertension, Pt. on medications and Pt. on home beta blockers Normal cardiovascular exam Rhythm:Regular Rate:Normal  ECG: SR, rate 67  Pre-op eval per cardiology Tamala Julian)   Neuro/Psych negative neurological ROS  negative psych ROS    GI/Hepatic negative GI ROS, Neg liver ROS,   Endo/Other  negative endocrine ROS  Renal/GU CRFRenal disease     Musculoskeletal negative musculoskeletal ROS (+)   Abdominal   Peds  Hematology negative hematology ROS (+)   Anesthesia Other Findings RIGHT URETERAL AND BILATERAL RENAL STONES  Reproductive/Obstetrics                           Anesthesia Physical Anesthesia Plan  ASA: III  Anesthesia Plan: General   Post-op Pain Management:    Induction: Intravenous  PONV Risk Score and Plan: 3 and Ondansetron, Dexamethasone and Treatment may vary due to age or medical condition  Airway Management Planned: LMA  Additional Equipment:   Intra-op Plan:   Post-operative Plan: Extubation in OR  Informed Consent: I have reviewed the patients History and Physical, chart, labs and discussed the procedure including the risks, benefits and alternatives for the proposed anesthesia with the patient or authorized representative who has indicated his/her understanding and acceptance.     Dental advisory given  Plan Discussed with: CRNA  Anesthesia Plan Comments: (Reviewed PAT note from 01/04/19 by Konrad Felix, PA-C)      Anesthesia Quick Evaluation  Anesthesia Evaluation  Patient identified by MRN, date of birth, ID band Patient awake    Reviewed: Allergy & Precautions, NPO status , Patient's Chart, lab work & pertinent test results, reviewed documented beta blocker date and time   Airway Mallampati: III  TM Distance: >3 FB Neck ROM: Full    Dental no notable dental hx.    Pulmonary neg pulmonary ROS,    Pulmonary exam normal breath sounds clear to auscultation       Cardiovascular hypertension, Pt. on medications and Pt. on home beta blockers Normal cardiovascular exam Rhythm:Regular Rate:Normal  ECG: SR, rate 67  Pre-op eval per cardiology Tamala Julian)   Neuro/Psych negative  neurological ROS  negative psych ROS   GI/Hepatic negative GI ROS, Neg liver ROS,   Endo/Other  negative endocrine ROS  Renal/GU CRFRenal disease     Musculoskeletal negative musculoskeletal ROS (+)   Abdominal   Peds  Hematology negative hematology ROS (+)   Anesthesia Other Findings RIGHT URETERAL AND BILATERAL RENAL STONES  Reproductive/Obstetrics                           Anesthesia Physical Anesthesia Plan  ASA: III  Anesthesia Plan: General   Post-op Pain Management:    Induction: Intravenous  PONV Risk Score and Plan: 3 and Ondansetron, Dexamethasone and Treatment may vary due to age or medical condition  Airway Management Planned: LMA  Additional Equipment:   Intra-op Plan:   Post-operative Plan: Extubation in OR  Informed Consent: I have reviewed the patients History and Physical, chart, labs and discussed the procedure including the risks, benefits and alternatives for the proposed anesthesia with the patient or authorized representative who has indicated his/her understanding and acceptance.     Dental advisory given  Plan Discussed with: CRNA  Anesthesia Plan Comments: (Reviewed PAT note from 01/04/19 by Konrad Felix, PA-C)      Anesthesia Quick Evaluation                                   Anesthesia Evaluation  Patient identified by MRN, date of birth, ID band Patient awake    Reviewed: Allergy & Precautions, NPO status , Patient's Chart, lab work & pertinent test results, reviewed documented beta blocker date and time   Airway Mallampati: III  TM Distance: >3 FB Neck ROM: Full    Dental no notable dental hx.    Pulmonary neg pulmonary ROS,    Pulmonary exam normal breath sounds clear to auscultation       Cardiovascular hypertension, Pt. on medications and Pt. on home beta blockers Normal cardiovascular exam Rhythm:Regular Rate:Normal  ECG: SR, rate 67  Pre-op eval per cardiology  Tamala Julian)   Neuro/Psych negative neurological ROS  negative psych ROS   GI/Hepatic negative GI ROS, Neg liver ROS,   Endo/Other  negative endocrine ROS  Renal/GU CRFRenal disease     Musculoskeletal negative musculoskeletal ROS (+)   Abdominal   Peds  Hematology negative hematology ROS (+)   Anesthesia Other Findings RIGHT URETERAL AND BILATERAL RENAL STONES  Reproductive/Obstetrics                           Anesthesia Physical Anesthesia Plan  ASA: III  Anesthesia Plan: General   Post-op Pain Management:    Induction: Intravenous  PONV Risk Score and Plan: 3 and  Ondansetron, Dexamethasone and Treatment may vary due to age or medical condition  Airway Management Planned: LMA  Additional Equipment:   Intra-op Plan:   Post-operative Plan: Extubation in OR  Informed Consent: I have reviewed the patients History and Physical, chart, labs and discussed the procedure including the risks, benefits and alternatives for the proposed anesthesia with the patient or authorized representative who has indicated his/her understanding and acceptance.     Dental advisory given  Plan Discussed with: CRNA  Anesthesia Plan Comments: (Reviewed PAT note from 01/04/19 by Konrad Felix, PA-C)      Anesthesia Quick Evaluation  Anesthesia Physical Anesthesia Plan  ASA: III  Anesthesia Plan: General   Post-op Pain Management:    Induction: Intravenous  PONV Risk Score and Plan: 4 or greater and Treatment may vary due to age or medical condition, Ondansetron, Dexamethasone and Propofol infusion  Airway Management Planned: LMA  Additional Equipment: None  Intra-op Plan:   Post-operative Plan: Extubation in OR  Informed Consent: I have reviewed the patients History and Physical, chart, labs and discussed the procedure including the risks, benefits and alternatives for the proposed anesthesia with the patient or authorized representative who  has indicated his/her understanding and acceptance.     Dental advisory given  Plan Discussed with: CRNA and Anesthesiologist  Anesthesia Plan Comments:       Anesthesia Quick Evaluation

## 2019-01-25 NOTE — Patient Instructions (Addendum)
LOLA CZERWONKA  01/25/2019   Your procedure is scheduled on: 01-27-19    Report to [facility]  Entrance    Report to Admitting at 8:30 AM    Call this number if you have problems the morning of surgery [phone]    Remember: Do not eat food or drink liquids :After Midnight.     BRUSH YOUR TEETH MORNING OF SURGERY AND RINSE YOUR MOUTH OUT, NO CHEWING GUM CANDY OR MINTS.     Take these medicines the morning of surgery with A SIP OF WATER: Amlodipine (Norvasc), Carvedilol (Coreg), and Clonidine (Catapres)                                You may not have any metal on your body including hair pins and              piercings  Do not wear jewelry, make-up, lotions, powders or perfumes, deodorant             Do not wear nail polish.  Do not shave  48 hours prior to surgery.               Do not bring valuables to the hospital. [facility].  Contacts, dentures or bridgework may not be worn into surgery.      Patients discharged the day of surgery will not be allowed to drive home. IF YOU ARE HAVING SURGERY AND GOING HOME THE SAME DAY, YOU MUST HAVE AN ADULT TO DRIVE YOU HOME AND BE WITH YOU FOR 24 HOURS. YOU MAY GO HOME BY TAXI OR UBER OR ORTHERWISE, BUT AN ADULT MUST ACCOMPANY YOU HOME AND STAY WITH YOU FOR 24 HOURS.    Name and phone number of your driver: Lonna Cobb [phone]               Please read over the following fact sheets you were given: _____________________________________________________________________             [facility] - Preparing for Surgery Before surgery, you can play an important role.  Because skin is not sterile, your skin needs to be as free of germs as possible.  You can reduce the number of germs on your skin by washing with CHG (chlorahexidine gluconate) soap before surgery.  CHG is an antiseptic cleaner which kills germs and bonds with the skin to continue killing  germs even after washing. Please DO NOT use if you have an allergy to CHG or antibacterial soaps.  If your skin becomes reddened/irritated stop using the CHG and inform your nurse when you arrive at Short Stay. Do not shave (including legs and underarms) for at least 48 hours prior to the first CHG shower.  You may shave your face/neck. Please follow these instructions carefully:  1.  Shower with CHG Soap the night before surgery and the  morning of Surgery.  2.  If you choose to wash your hair, wash your hair first as usual with your  normal  shampoo.  3.  After you shampoo, rinse your hair and body thoroughly to remove the  shampoo.  4.  Use CHG as you would any other liquid soap.  You can apply chg directly  to the skin and wash                       Gently with a scrungie or clean washcloth.  5.  Apply the CHG Soap to your body ONLY FROM THE NECK DOWN.   Do not use on face/ open                           Wound or open sores. Avoid contact with eyes, ears mouth and genitals (private parts).                       Wash face,  Genitals (private parts) with your normal soap.             6.  Wash thoroughly, paying special attention to the area where your surgery  will be performed.  7.  Thoroughly rinse your body with warm water from the neck down.  8.  DO NOT shower/wash with your normal soap after using and rinsing off  the CHG Soap.                9.  Pat yourself dry with a clean towel.            10.  Wear clean pajamas.            11.  Place clean sheets on your bed the night of your first shower and do not  sleep with pets. Day of Surgery : Do not apply any lotions/deodorants the morning of surgery.  Please wear clean clothes to the hospital/surgery center.  FAILURE TO FOLLOW THESE INSTRUCTIONS MAY RESULT IN THE CANCELLATION OF YOUR SURGERY PATIENT SIGNATURE_________________________________  NURSE  SIGNATURE__________________________________  ________________________________________________________________________

## 2019-01-25 NOTE — Progress Notes (Signed)
01-04-19 (Epic) EKG

## 2019-01-26 ENCOUNTER — Encounter (HOSPITAL_COMMUNITY)
Admission: RE | Admit: 2019-01-26 | Discharge: 2019-01-26 | Disposition: A | Discharge status: Home / Self Care | Payer: Medicare Other | Source: Ambulatory Visit | Attending: Urology | Admitting: Urology

## 2019-01-26 ENCOUNTER — Encounter (HOSPITAL_COMMUNITY): Payer: Self-pay

## 2019-01-26 ENCOUNTER — Other Ambulatory Visit: Payer: Self-pay

## 2019-01-26 DIAGNOSIS — Z853 Personal history of malignant neoplasm of breast: Secondary | ICD-10-CM | POA: Diagnosis not present

## 2019-01-26 DIAGNOSIS — I2510 Atherosclerotic heart disease of native coronary artery without angina pectoris: Secondary | ICD-10-CM | POA: Diagnosis not present

## 2019-01-26 DIAGNOSIS — N200 Calculus of kidney: Secondary | ICD-10-CM

## 2019-01-26 DIAGNOSIS — Z803 Family history of malignant neoplasm of breast: Secondary | ICD-10-CM | POA: Diagnosis not present

## 2019-01-26 DIAGNOSIS — Z85038 Personal history of other malignant neoplasm of large intestine: Secondary | ICD-10-CM | POA: Diagnosis not present

## 2019-01-26 DIAGNOSIS — I10 Essential (primary) hypertension: Secondary | ICD-10-CM | POA: Diagnosis not present

## 2019-01-26 DIAGNOSIS — N179 Acute kidney failure, unspecified: Secondary | ICD-10-CM

## 2019-01-26 DIAGNOSIS — Z01812 Encounter for preprocedural laboratory examination: Secondary | ICD-10-CM | POA: Insufficient documentation

## 2019-01-26 DIAGNOSIS — Z87448 Personal history of other diseases of urinary system: Secondary | ICD-10-CM | POA: Diagnosis not present

## 2019-01-26 DIAGNOSIS — N202 Calculus of kidney with calculus of ureter: Secondary | ICD-10-CM | POA: Diagnosis not present

## 2019-01-26 DIAGNOSIS — Z7982 Long term (current) use of aspirin: Secondary | ICD-10-CM | POA: Diagnosis not present

## 2019-01-26 DIAGNOSIS — M1990 Unspecified osteoarthritis, unspecified site: Secondary | ICD-10-CM | POA: Diagnosis not present

## 2019-01-26 DIAGNOSIS — K219 Gastro-esophageal reflux disease without esophagitis: Secondary | ICD-10-CM | POA: Diagnosis not present

## 2019-01-26 DIAGNOSIS — Z9011 Acquired absence of right breast and nipple: Secondary | ICD-10-CM | POA: Diagnosis not present

## 2019-01-26 DIAGNOSIS — Z9049 Acquired absence of other specified parts of digestive tract: Secondary | ICD-10-CM | POA: Diagnosis not present

## 2019-01-26 DIAGNOSIS — Z79899 Other long term (current) drug therapy: Secondary | ICD-10-CM | POA: Diagnosis not present

## 2019-01-26 LAB — CBC
HCT: 36.6 % (ref 36.0–46.0)
Hemoglobin: 12.2 g/dL (ref 12.0–15.0)
MCH: 32 pg (ref 26.0–34.0)
MCHC: 33.3 g/dL (ref 30.0–36.0)
MCV: 96.1 fL (ref 80.0–100.0)
Platelets: 449 10*3/uL — ABNORMAL HIGH (ref 150–400)
RBC: 3.81 MIL/uL — ABNORMAL LOW (ref 3.87–5.11)
RDW: 14.3 % (ref 11.5–15.5)
WBC: 9 10*3/uL (ref 4.0–10.5)
nRBC: 0 % (ref 0.0–0.2)

## 2019-01-26 LAB — BASIC METABOLIC PANEL
Anion gap: 9 (ref 5–15)
BUN: 39 mg/dL — ABNORMAL HIGH (ref 8–23)
CO2: 21 mmol/L — ABNORMAL LOW (ref 22–32)
Calcium: 8.8 mg/dL — ABNORMAL LOW (ref 8.9–10.3)
Chloride: 111 mmol/L (ref 98–111)
Creatinine, Ser: 1.47 mg/dL — ABNORMAL HIGH (ref 0.44–1.00)
GFR calc Af Amer: 38 mL/min — ABNORMAL LOW (ref >60)
GFR calc non Af Amer: 33 mL/min — ABNORMAL LOW (ref >60)
Glucose, Bld: 123 mg/dL — ABNORMAL HIGH (ref 70–99)
Potassium: 4.2 mmol/L (ref 3.5–5.1)
Sodium: 141 mmol/L (ref 135–145)

## 2019-01-27 ENCOUNTER — Encounter (HOSPITAL_COMMUNITY): Payer: Self-pay | Admitting: Registered Nurse

## 2019-01-27 ENCOUNTER — Ambulatory Visit (HOSPITAL_COMMUNITY): Payer: Medicare Other | Admitting: Physician Assistant

## 2019-01-27 ENCOUNTER — Encounter (HOSPITAL_COMMUNITY)
Admission: RE | Disposition: A | Discharge status: Home / Self Care | Payer: Self-pay | Source: Home / Self Care | Attending: Urology

## 2019-01-27 ENCOUNTER — Ambulatory Visit (HOSPITAL_COMMUNITY): Payer: Medicare Other

## 2019-01-27 ENCOUNTER — Other Ambulatory Visit: Payer: Self-pay

## 2019-01-27 ENCOUNTER — Ambulatory Visit (HOSPITAL_COMMUNITY)
Admission: RE | Admit: 2019-01-27 | Discharge: 2019-01-27 | Disposition: A | Discharge status: Home / Self Care | Payer: Medicare Other | Attending: Urology | Admitting: Urology

## 2019-01-27 DIAGNOSIS — I2510 Atherosclerotic heart disease of native coronary artery without angina pectoris: Secondary | ICD-10-CM | POA: Diagnosis not present

## 2019-01-27 DIAGNOSIS — M1990 Unspecified osteoarthritis, unspecified site: Secondary | ICD-10-CM | POA: Diagnosis not present

## 2019-01-27 DIAGNOSIS — Z803 Family history of malignant neoplasm of breast: Secondary | ICD-10-CM | POA: Diagnosis not present

## 2019-01-27 DIAGNOSIS — N202 Calculus of kidney with calculus of ureter: Secondary | ICD-10-CM | POA: Diagnosis not present

## 2019-01-27 DIAGNOSIS — Z87448 Personal history of other diseases of urinary system: Secondary | ICD-10-CM | POA: Diagnosis not present

## 2019-01-27 DIAGNOSIS — K219 Gastro-esophageal reflux disease without esophagitis: Secondary | ICD-10-CM | POA: Insufficient documentation

## 2019-01-27 DIAGNOSIS — Z79899 Other long term (current) drug therapy: Secondary | ICD-10-CM | POA: Insufficient documentation

## 2019-01-27 DIAGNOSIS — Z853 Personal history of malignant neoplasm of breast: Secondary | ICD-10-CM | POA: Insufficient documentation

## 2019-01-27 DIAGNOSIS — Z7982 Long term (current) use of aspirin: Secondary | ICD-10-CM | POA: Insufficient documentation

## 2019-01-27 DIAGNOSIS — Z9049 Acquired absence of other specified parts of digestive tract: Secondary | ICD-10-CM | POA: Diagnosis not present

## 2019-01-27 DIAGNOSIS — Z85038 Personal history of other malignant neoplasm of large intestine: Secondary | ICD-10-CM | POA: Insufficient documentation

## 2019-01-27 DIAGNOSIS — N179 Acute kidney failure, unspecified: Secondary | ICD-10-CM | POA: Diagnosis not present

## 2019-01-27 DIAGNOSIS — I10 Essential (primary) hypertension: Secondary | ICD-10-CM | POA: Diagnosis not present

## 2019-01-27 DIAGNOSIS — Z9011 Acquired absence of right breast and nipple: Secondary | ICD-10-CM | POA: Insufficient documentation

## 2019-01-27 HISTORY — PX: CYSTOSCOPY WITH RETROGRADE PYELOGRAM, URETEROSCOPY AND STENT PLACEMENT: SHX5789

## 2019-01-27 HISTORY — PX: HOLMIUM LASER APPLICATION: SHX5852

## 2019-01-27 SURGERY — CYSTOURETEROSCOPY, WITH RETROGRADE PYELOGRAM AND STENT INSERTION
Anesthesia: General | Laterality: Bilateral

## 2019-01-27 MED ORDER — FENTANYL CITRATE (PF) 100 MCG/2ML IJ SOLN: 25.0000 ug | INTRAMUSCULAR | Status: DC | PRN

## 2019-01-27 MED ORDER — CEPHALEXIN 500 MG PO CAPS: 500.0000 mg | ORAL_CAPSULE | Freq: Two times a day (BID) | ORAL | 0 refills | Status: DC

## 2019-01-27 MED ORDER — LIDOCAINE 2% (20 MG/ML) 5 ML SYRINGE
INTRAMUSCULAR | Status: DC | PRN
  Administered 2019-01-27: 60 mg via INTRAVENOUS

## 2019-01-27 MED ORDER — ONDANSETRON HCL 4 MG/2ML IJ SOLN
INTRAMUSCULAR | Status: AC
  Filled 2019-01-27: qty 2

## 2019-01-27 MED ORDER — EPHEDRINE SULFATE-NACL 50-0.9 MG/10ML-% IV SOSY
PREFILLED_SYRINGE | INTRAVENOUS | Status: DC | PRN
  Administered 2019-01-27: 10 mg via INTRAVENOUS
  Administered 2019-01-27: 20 mg via INTRAVENOUS

## 2019-01-27 MED ORDER — SODIUM CHLORIDE 0.9 % IR SOLN
Status: DC | PRN
  Administered 2019-01-27 (×2): 3000 mL

## 2019-01-27 MED ORDER — PROPOFOL 10 MG/ML IV BOLUS
INTRAVENOUS | Status: AC
  Filled 2019-01-27: qty 20

## 2019-01-27 MED ORDER — DEXAMETHASONE SODIUM PHOSPHATE 10 MG/ML IJ SOLN
INTRAMUSCULAR | Status: DC | PRN
  Administered 2019-01-27: 5 mg via INTRAVENOUS

## 2019-01-27 MED ORDER — OXYCODONE HCL 5 MG PO TABS: 5.0000 mg | ORAL_TABLET | Freq: Once | ORAL | Status: DC | PRN

## 2019-01-27 MED ORDER — ONDANSETRON HCL 4 MG/2ML IJ SOLN
INTRAMUSCULAR | Status: DC | PRN
  Administered 2019-01-27: 4 mg via INTRAVENOUS

## 2019-01-27 MED ORDER — OXYCODONE HCL 5 MG/5ML PO SOLN: 5.0000 mg | Freq: Once | ORAL | Status: DC | PRN

## 2019-01-27 MED ORDER — SODIUM CHLORIDE 0.9 % IV SOLN
2.0000 g | INTRAVENOUS | Status: AC
  Administered 2019-01-27: 10:00:00 2 g via INTRAVENOUS
  Filled 2019-01-27: qty 20

## 2019-01-27 MED ORDER — EPHEDRINE 5 MG/ML INJ
INTRAVENOUS | Status: AC
  Filled 2019-01-27: qty 10

## 2019-01-27 MED ORDER — LACTATED RINGERS IV SOLN
INTRAVENOUS | Status: DC
  Administered 2019-01-27 (×2): via INTRAVENOUS

## 2019-01-27 MED ORDER — LIDOCAINE 2% (20 MG/ML) 5 ML SYRINGE
INTRAMUSCULAR | Status: AC
  Filled 2019-01-27: qty 5

## 2019-01-27 MED ORDER — FENTANYL CITRATE (PF) 100 MCG/2ML IJ SOLN
INTRAMUSCULAR | Status: AC
  Filled 2019-01-27: qty 2

## 2019-01-27 MED ORDER — PROPOFOL 10 MG/ML IV BOLUS
INTRAVENOUS | Status: DC | PRN
  Administered 2019-01-27: 120 mg via INTRAVENOUS

## 2019-01-27 MED ORDER — FENTANYL CITRATE (PF) 100 MCG/2ML IJ SOLN
INTRAMUSCULAR | Status: DC | PRN
  Administered 2019-01-27: 25 ug via INTRAVENOUS
  Administered 2019-01-27: 50 ug via INTRAVENOUS
  Administered 2019-01-27: 25 ug via INTRAVENOUS

## 2019-01-27 MED ORDER — SODIUM CHLORIDE 0.9 % IV SOLN
INTRAVENOUS | Status: DC | PRN
  Administered 2019-01-27: 30 mL

## 2019-01-27 MED ORDER — DEXAMETHASONE SODIUM PHOSPHATE 10 MG/ML IJ SOLN
INTRAMUSCULAR | Status: AC
  Filled 2019-01-27: qty 1

## 2019-01-27 MED ORDER — ONDANSETRON HCL 4 MG/2ML IJ SOLN: 4.0000 mg | Freq: Once | INTRAMUSCULAR | Status: DC | PRN

## 2019-01-27 SURGICAL SUPPLY — 30 items
BAG URO CATCHER STRL LF (MISCELLANEOUS) ×3 IMPLANT
BASKET LASER NITINOL 1.9FR (BASKET) ×3 IMPLANT
BASKET STONE NCOMPASS (UROLOGICAL SUPPLIES) ×3 IMPLANT
CATH INTERMIT  6FR 70CM (CATHETERS) ×6 IMPLANT
CLOTH BEACON ORANGE TIMEOUT ST (SAFETY) ×3 IMPLANT
COVER SURGICAL LIGHT HANDLE (MISCELLANEOUS) ×3 IMPLANT
COVER WAND RF STERILE (DRAPES) IMPLANT
EXTRACTOR STONE 1.7FRX115CM (UROLOGICAL SUPPLIES) IMPLANT
FIBER LASER FLEXIVA 1000 (UROLOGICAL SUPPLIES) IMPLANT
FIBER LASER FLEXIVA 365 (UROLOGICAL SUPPLIES) IMPLANT
FIBER LASER FLEXIVA 550 (UROLOGICAL SUPPLIES) IMPLANT
FIBER LASER TRAC TIP (UROLOGICAL SUPPLIES) ×3 IMPLANT
GLOVE BIOGEL M STRL SZ7.5 (GLOVE) ×3 IMPLANT
GOWN STRL REUS W/TWL LRG LVL3 (GOWN DISPOSABLE) ×6 IMPLANT
GUIDEWIRE ANG ZIPWIRE 038X150 (WIRE) ×6 IMPLANT
GUIDEWIRE STR DUAL SENSOR (WIRE) ×6 IMPLANT
GUIDEWIRE ZIPWRE .038 STRAIGHT (WIRE) ×3 IMPLANT
IV NS 1000ML (IV SOLUTION) ×2
IV NS 1000ML BAXH (IV SOLUTION) ×1 IMPLANT
KIT TURNOVER KIT A (KITS) IMPLANT
MANIFOLD NEPTUNE II (INSTRUMENTS) ×3 IMPLANT
PACK CYSTO (CUSTOM PROCEDURE TRAY) ×3 IMPLANT
SHEATH URETERAL 12FRX28CM (UROLOGICAL SUPPLIES) ×3 IMPLANT
SHEATH URETERAL 12FRX35CM (MISCELLANEOUS) IMPLANT
STENT POLARIS 5FRX24 (STENTS) ×6 IMPLANT
SYR CONTROL 10ML LL (SYRINGE) ×3 IMPLANT
TUBE FEEDING 8FR 16IN STR KANG (MISCELLANEOUS) ×3 IMPLANT
TUBING CONNECTING 10 (TUBING) ×2 IMPLANT
TUBING CONNECTING 10' (TUBING) ×1
TUBING UROLOGY SET (TUBING) ×3 IMPLANT

## 2019-01-27 NOTE — Discharge Instructions (Signed)
1 - You may have urinary urgency (bladder spasms) and bloody urine on / off with stent in place. This is normal. ° °2 - Call MD or go to ER for fever >102, severe pain / nausea / vomiting not relieved by medications, or acute change in medical status ° °

## 2019-01-27 NOTE — Anesthesia Postprocedure Evaluation (Signed)
Anesthesia Post Note  Patient: ALLAHNA HUSBAND  Procedure(s) Performed: CYSTOSCOPY WITH RETROGRADE PYELOGRAM, URETEROSCOPY AND STENT PLACEMENT (Bilateral ) HOLMIUM LASER APPLICATION (Bilateral )     Patient location during evaluation: PACU Anesthesia Type: General Level of consciousness: awake and alert Pain management: pain level controlled Vital Signs Assessment: post-procedure vital signs reviewed and stable Respiratory status: spontaneous breathing, nonlabored ventilation and respiratory function stable Cardiovascular status: blood pressure returned to baseline and stable Postop Assessment: no apparent nausea or vomiting Anesthetic complications: no    Last Vitals:  Vitals:   01/27/19 1232 01/27/19 1245  BP:  (!) 160/55  Pulse:  62  Resp:  13  Temp:    SpO2: 98% 93%    Last Pain:  Vitals:   01/27/19 1245  TempSrc:   PainSc: 0-No pain                 Audry Pili

## 2019-01-27 NOTE — H&P (Signed)
[name] is an 85 y.o. female.    Chief Complaint: Pre-OP BILATERAL 2nd Stage Ureteroscopic Stone Manipulation  HPI:    1 -Urolithiasis -  05/2013 - bilateral URS 05/07/13 for bilateral uretral stones to stone free.  12/2018 - bilateral 5x24 stent placement / 1st stage ureterostopy for Rt 66mm distal stone with mod hydro (920HU) and about 50mm total volume non-obstructing stone each kidney is setting of acute renal failure.   2 - Acute Renal Failure - Cr 2.5 in setting of right ureteral stone 12/2018. No left hydro. Admits to some poor PO intake. Backj towards baseline. With Cr 1.47, K 4.2 01/2019.    PMH sig for chole, hyst, colon resection, "slow growing" but progressive ovarian cancer folowed by Dr. Aldean Ast. Her PCP is Dr. Fara Olden with [facility].    Today [name] is seen to proceed with BILATERAL 2nd ureteroscopic stone manipulation. GFR back towards baseline by serum labs. She has had stents in for nearly a month.   Past Medical History:  Diagnosis Date  . Arthritis    HANDS AND BACK  . 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 Breast cancer ([place]) 2010   RIGHT  . CAD (coronary artery disease)   . Dysuria   . GERD (gastroesophageal reflux disease)    HX OF  . Heart murmur   . History of breast cancer ONCOLOGIST-- DR Benay Spice   DX 2010--  RIGHT BREAST DCIS  HIGH GRADE S/P LUMPECTOMY W/ SLN BX--  NO RECURRENCE  . 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 History of skin cancer    excision basal cell  . Hyperlipidemia   . Hypertension   . Kidney stones 7/14  . Nocturia   . 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 Renal stones 2014  . Shingles 30 YRS AGO  . Ureteral calculi    bilateral    Past Surgical History:  Procedure Laterality Date  .  APPENDECTOMY    . BREAST LUMPECTOMY Right 04-13-2009   W/ SLN BX  . CARDIAC CATHETERIZATION  11-11-2005 DR Daneen Schick   MODERATE LAD DISEASE/ NORMAL LVF/ EF 65-75%  . CATARACT EXTRACTION W/ INTRAOCULAR LENS IMPLANT Bilateral 04/26/2013  . CHOLECYSTECTOMY  1990   OPEN  . CYSTOSCOPY W/ URETERAL STENT PLACEMENT Bilateral 05/07/2013   Procedure: CYSTOSCOPY WITH STENT REPLACEMENTS;  Surgeon: Alexis Frock, MD;  Location: [facility];  Service: Urology;  Laterality: Bilateral;  . CYSTOSCOPY WITH BIOPSY N/A 04/07/2013   Procedure: CYSTOSCOPY WITH BIOPSY and fulgeration;  Surgeon: Alexis Frock, MD;  Location: [facility];  Service: Urology;  Laterality: N/A;  . CYSTOSCOPY WITH RETROGRADE PYELOGRAM, URETEROSCOPY AND STENT PLACEMENT Bilateral 05/07/2013   Procedure: CYSTOSCOPY WITH RETROGRADE PYELOGRAM, URETEROSCOPY ;  Surgeon: Alexis Frock, MD;  Location: [facility];  Service: Urology;  Laterality: Bilateral;  . CYSTOSCOPY WITH RETROGRADE PYELOGRAM, URETEROSCOPY AND STENT PLACEMENT Bilateral 01/06/2019   Procedure: CYSTOSCOPY WITH RETROGRADE PYELOGRAM, URETEROSCOPY AND STENT PLACEMENT, FIRST STAGE;  Surgeon: Alexis Frock, MD;  Location: [facility];  Service: Urology;  Laterality: Bilateral;  . CYSTOSCOPY WITH STENT PLACEMENT Bilateral 04/07/2013   Procedure: CYSTOSCOPY WITH STENT PLACEMENT;  Surgeon: Alexis Frock, MD;  Location: [facility];  Service: Urology;  Laterality: Bilateral;  . DX LAPAROSCOPY W/ PERITONEAL AND OMENTAL BX'S AND WASHINGS  02-16-2008  . EXCISION RIGHT BREAST MASS  02-10-2002  . EXP. LAP. EXTENSIVE ADHESIOLYSIS/ RESECTION TERMINAL ILEUM , ASCENDING AND DESCENDING COLON WITH CREATION ILEOSTOMY AND MUCOUS FISTULA  02-19-2008   PERFERATION AND ABD. CANCER--  TAKEDOWN ILEOSTOMY 08-23-2008  . HOLMIUM LASER APPLICATION Bilateral 11/07/3084   Procedure: HOLMIUM LASER APPLICATION;  Surgeon: Alexis Frock, MD;  Location: [facility];  Service: Urology;  Laterality: Bilateral;   . TOTAL ABDOMINAL HYSTERECTOMY W/ BILATERAL SALPINGOOPHORECTOMY  1976  . TOTAL ABDOMINAL HYSTERECTOMY W/ BILATERAL SALPINGOOPHORECTOMY  1976  . VULVAR LESION REMOVAL N/A 08/22/2014   Procedure: EXCISION OF VULVAR CYST  ;  Surgeon: Lyman Speller, MD;  Location: [facility];  Service: Gynecology;  Laterality: N/A;  . WOUND DEBRIDEMENT  05/28/2012   Procedure: DEBRIDEMENT ABDOMINAL WOUND;  Surgeon: Haywood Lasso, MD;  Location: [facility];  Service: General;  Laterality: N/A;  excision chronic wound abdominal wall    Family History  Problem Relation Age of Onset  . Heart disease Mother   . Hyperlipidemia Mother   . Breast cancer Sister   . Cancer Sister        breast   Social History:  reports that she has never smoked. She has never used smokeless tobacco. She reports that she does not drink alcohol or use drugs.  Allergies: No Known Allergies  No medications prior to admission.    Results for orders placed or performed during the hospital encounter of 01/26/19 (from the past 48 hour(s))  Basic metabolic panel     Status: Abnormal   Collection Time: 01/26/19  2:34 PM  Result Value Ref Range   Sodium 141 135 - 145 mmol/L   Potassium 4.2 3.5 - 5.1 mmol/L   Chloride 111 98 - 111 mmol/L   CO2 21 (L) 22 - 32 mmol/L   Glucose, Bld 123 (H) 70 - 99 mg/dL   BUN 39 (H) 8 - 23 mg/dL   Creatinine, Ser 1.47 (H) 0.44 - 1.00 mg/dL   Calcium 8.8 (L) 8.9 - 10.3 mg/dL   GFR calc non Af Amer 33 (L) >60 mL/min   GFR calc Af Amer 38 (L) >60 mL/min   Anion gap 9 5 - 15    Comment: Performed at [facility], [place] [address], [place], [place] [address]  CBC     Status: Abnormal   Collection Time: 01/26/19  2:34 PM  Result Value Ref Range   WBC 9.0 4.0 - 10.5 K/uL   RBC 3.81 (L) 3.87 - 5.11 MIL/uL   Hemoglobin 12.2 12.0 - 15.0 g/dL   HCT 36.6 36.0 - 46.0 %   MCV 96.1 80.0 - 100.0 fL   MCH 32.0 26.0 - 34.0 pg   MCHC 33.3 30.0 - 36.0 g/dL   RDW 14.3 11.5 -  15.5 %   Platelets 449 (H) 150 - 400 K/uL   nRBC 0.0 0.0 - 0.2 %    Comment: Performed at [facility], [place] [address], [place], [place] [address]   No results found.  Review of Systems  Constitutional: Negative for chills and fever.  HENT: Negative.   Eyes: Negative.   Respiratory: Negative.   Cardiovascular: Negative.   Gastrointestinal: Negative.   Genitourinary: Positive for dysuria, frequency and urgency.  Musculoskeletal: Negative.   Skin: Negative.   Neurological: Negative.   Endo/Heme/Allergies: Negative.   Psychiatric/Behavioral: Negative.     Last menstrual period 10/07/1974. Physical Exam  Constitutional: She appears well-developed.  HENT:  Head:  Normocephalic.  Eyes: Pupils are equal, round, and reactive to light.  Neck: Normal range of motion.  Cardiovascular: Normal rate.  Respiratory: Effort normal.  GI: Soft.  Genitourinary:    Genitourinary Comments: Minimal CVAT at present   Neurological: She is alert.  Skin: Skin is warm.  Psychiatric: She has a normal mood and affect.     Assessment/Plan  Proceed as planned with 2nd Stage Uretereoscopic Stone Manipulation with goal of stone free. Risks,  Benefits, alternatives, expected peri-op course discussed including need for stent exchange given bilateral procedure.  In response to the COVID-19 crises affecting the [place], the [facility] issued a request that all hospitals and ambulatory surgery centers suspend all non-urgent surgery.  In keeping with this guidance this patient's surgery has NOT been postponed as the patient and I agree that significant harm could result from delay including stent encrustation / permanent damage to kidney, and continued  pain.  We specifically discussed that there is some risk of COVID-19 transmission in the peri-operative setting and that extra precautions are being taken to minimize this but there no  guarantees that transmission will not occur. I answered all the patient's questions to the best of my ability.   Alexis Frock, MD 01/27/2019, 6:42 AM

## 2019-01-27 NOTE — Progress Notes (Signed)
Per Dr Tresa Moore, pt does not need to void prior to discharge.

## 2019-01-27 NOTE — Anesthesia Postprocedure Evaluation (Signed)
Anesthesia Post Note  Patient: [name]  Procedure(s) Performed: CYSTOSCOPY WITH RETROGRADE PYELOGRAM, URETEROSCOPY AND STENT PLACEMENT (Bilateral ) HOLMIUM LASER APPLICATION (Bilateral )     Patient location during evaluation: PACU Anesthesia Type: General Level of consciousness: awake and alert Pain management: pain level controlled Vital Signs Assessment: post-procedure vital signs reviewed and stable Respiratory status: spontaneous breathing, nonlabored ventilation and respiratory function stable Cardiovascular status: blood pressure returned to baseline and stable Postop Assessment: no apparent nausea or vomiting Anesthetic complications: no    Last Vitals:  Vitals:   01/27/19 1232 01/27/19 1245  BP:  (!) 160/55  Pulse:  62  Resp:  13  Temp:    SpO2: 98% 93%    Last Pain:  Vitals:   01/27/19 1245  TempSrc:   PainSc: 0-No pain                 Audry Pili

## 2019-01-27 NOTE — Anesthesia Procedure Notes (Signed)
Procedure Name: LMA Insertion Date/Time: 01/27/2019 10:13 AM Performed by: Talbot Grumbling, CRNA Pre-anesthesia Checklist: Patient identified, Emergency Drugs available, Suction available and Patient being monitored Patient Re-evaluated:Patient Re-evaluated prior to induction Oxygen Delivery Method: Circle system utilized Preoxygenation: Pre-oxygenation with 100% oxygen Induction Type: IV induction LMA: LMA inserted LMA Size: 4.0 Number of attempts: 1 Placement Confirmation: positive ETCO2 and breath sounds checked- equal and bilateral Tube secured with: Tape Dental Injury: Teeth and Oropharynx as per pre-operative assessment

## 2019-01-27 NOTE — Transfer of Care (Signed)
Immediate Anesthesia Transfer of Care Note  Patient: [name]  Procedure(s) Performed: CYSTOSCOPY WITH RETROGRADE PYELOGRAM, URETEROSCOPY AND STENT PLACEMENT (Bilateral ) HOLMIUM LASER APPLICATION (Bilateral )  Patient Location: PACU  Anesthesia Type:General  Level of Consciousness: sedated  Airway & Oxygen Therapy: Patient Spontanous Breathing and Patient connected to face mask oxygen  Post-op Assessment: Report given to RN and Post -op Vital signs reviewed and stable  Post vital signs: Reviewed and stable  Last Vitals:  Vitals Value Taken Time  BP 160/55 01/27/2019 12:12 PM  Temp    Pulse 74 01/27/2019 12:13 PM  Resp 17 01/27/2019 12:13 PM  SpO2 99 % 01/27/2019 12:13 PM  Vitals shown include unvalidated device data.  Last Pain:  Vitals:   01/27/19 0900  TempSrc:   PainSc: 0-No pain      Patients Stated Pain Goal: 4 (33/12/50 8719)  Complications: No apparent anesthesia complications

## 2019-01-27 NOTE — Brief Op Note (Signed)
01/27/2019  12:04 PM  PATIENT:  [name]  85 y.o. female  PRE-OPERATIVE DIAGNOSIS:  BILATERAL RENAL STONES, ACUTE RENAL FAILURE  POST-OPERATIVE DIAGNOSIS:  BILATERAL RENAL STONES, ACUTE RENAL FAILURE  PROCEDURE:  Procedure(s) with comments: CYSTOSCOPY WITH RETROGRADE PYELOGRAM, URETEROSCOPY AND STENT PLACEMENT (Bilateral) - 75 MINS HOLMIUM LASER APPLICATION (Bilateral)  SURGEON:  Surgeon(s) and Role:    Alexis Frock, MD - Primary  PHYSICIAN ASSISTANT:   ASSISTANTS: none   ANESTHESIA:   general  EBL:  0 mL   BLOOD ADMINISTERED:none  DRAINS: none   LOCAL MEDICATIONS USED:  NONE  SPECIMEN:  Source of Specimen:  bilateral renal / ureteral stone fragments  DISPOSITION OF SPECIMEN:  [facility] for compositional analysis  COUNTS:  YES  TOURNIQUET:  * No tourniquets in log *  DICTATION: .Other Dictation: Dictation Number [id number]  PLAN OF CARE: Discharge to home after PACU  PATIENT DISPOSITION:  PACU - hemodynamically stable.   Delay start of Pharmacological VTE agent (>24hrs) due to surgical blood loss or risk of bleeding: yes

## 2019-01-28 NOTE — Op Note (Signed)
NAME: [name], [name] MEDICAL RECORD [id number] ACCOUNT [id number] DATE OF BIRTH:12-20-1933 FACILITY: [facility] LOCATION: [facility] PHYSICIAN:Brinlyn Cena Tresa Moore, MD  OPERATIVE REPORT  DATE OF PROCEDURE:  01/27/2019  PREOPERATIVE DIAGNOSES:  Bilateral renal and ureteral stones, history of acute renal failure.  PROCEDURE: 1.  Cystoscopy, bilateral pyelograms, interpretation. 2.  Second-stage bilateral ureteroscopy with laser lithotripsy. 3.  Exchange of bilateral ureteral stents 5 x 24 Polaris, no tether.  ESTIMATED BLOOD LOSS:  Nil.  COMPLICATIONS:  None.  SPECIMENS:  Bilateral renal and ureteral stone fragments for analysis.  FINDINGS: 1.  Significant mucosal edema without stricturing at the site of prior right ureteral stone impaction. 2.  Relatively small volume right renal and ureteral stones. 3.  Complete resolution of all accessible stone fragments larger than 130 mm on the right side following laser lithotripsy and basket extraction. 4.  Successful replacement of right ureteral stent, proximal in renal pelvis, distal in urinary bladder. 5.  Multifocal left renal stone including left upper mid infundibular obstructing stone. 6.  Successful removal of all accessible stone fragments larger than 130 mm following laser lithotripsy and extraction of left side. 7.  Successful placement of left ureteral stent, proximal in renal pelvis, distal in urinary bladder.  INDICATIONS:  The patient is a very pleasant 85 year old lady with history of high risk recurrent urolithiasis.  She had a bout of flank pain and was found to be in acute renal failure from a very severely impacted right ureteral stone as well as  bilateral renal stones.  She underwent first-stage bilateral ureteroscopic stimulation last month, which she tolerated well and believes her acute obstruction and her renal function has now improved significantly.  She presents for a second-stage  procedure today with a goal of stone  free.  Informed consent was obtained and placed in the medical record.  PROCEDURE IN DETAIL:  The patient being identified and procedure identified as second-stage bilateral ureteroscopic stimulation was confirmed.  Procedure timeout was performed.  Antibiotics were administered.  General LMA anesthesia was induced.  The  patient was placed into a low lithotomy position.  A sterile field was created prepping the patient's vagina, introitus and proximal thighs using iodine.  Cystourethroscopy was performed using a 20-French cystoscope with offset lens.  Inspection of the  urinary bladder revealed no diverticula, calcifications, papillary lesions.  Distal and bilateral stents were seen in situ.  There was some encrustation that prevented cannulation of these.  They were then both removed and set aside for discard,  inspected and intact.  Next, the right ureteral orifice was cannulated with a 6-French renal catheter, and right retrograde pyelogram was obtained.  A right retrograde pyelogram demonstrates a single right ureter and single-system right kidney with some relative narrowing at the area of the iliacs consistent with prior site of stone impaction.  A 0.38 ZIPwire was advanced to the lower pole and set  aside as a safety wire.  Similarly, a left retrograde pyelogram was obtained.  Left retrograde pyelogram showed a single left ureter and single-system left kidney.  No filling defects or narrowing noted.  The ZIPwire was advanced to the upper left side and set aside as a safety wire.  An 8-French feeding tube was placed in the  urinary bladder for pressure release, and semirigid ureteroscopy performed in the distal right ureter alongside a sensor working wire.  As expected, there was an area of significant mucosal edema at the site of prior stone impaction on the right side,  but fortunately without obvious  stricturing.  There were small ureteral stone fragments proximal to this that were retrograde  positioned to the level of the kidney, and a sensor wire was left in place, acting as a working wire.  Similar to the left,  ureteroscopy was performed the entire length of the left ureter alongside a separate sensor working wire.  No mucosal abnormalities were found.  Next, a 12/14 small length access sheath was placed over the left sensor working wire to the level of the  proximal ureter using continuous fluoroscopic guidance, and flexible digital ureteroscopy performed at the proximal left ureter and systematic inspection of the left kidney.  There were multifocal papillary tip calcifications and a dominant obstructing  upper mid infundibular stone.  The infundibular stone was much too large for simple basketing.  Holmium laser energy was applied at a setting of 0.2 joules and 20 Hz.  Using a dusting technique, approximately 60% to 70% of the stone was ablated.  The  remainder of the stone was fragmented into pieces approximately 1-2 mm.  An escape basket was then used to remove all accessible stone fragments from the kidney, including the papillary tip calcifications.  They were set aside for composition analysis.   There were still some multifocal small fragments that were amenable to basketing with an NCompass basket, and these were removed as such, thus removing all accessible stone fragments larger than 130 mm on the left side.  The access sheath was removed  under continuous vision.  No mucosal abnormalities were found.  Next, the access sheath was placed over the right sensor working wire to the level of the proximal right ureter using continuous fluoroscopic guidance, and flexible digital ureteroscopy was  performed of the right proximal ureter with maximum inspection of the right kidney.  AS  suspected there was retrograde positioning of several small stone fragments that had previously been in the ureter to the level of the kidney.  These were  repositioned into an upper pole calix.  Laser  lithotripsy divided these using similar settings and fragmented into pieces 1-2 mm and the Escape basket once again used to remove these.  Following this, there was complete resolution of all accessible stone  fragments larger than 130 mm on the right side.  The access sheath was removed under continuous vision, and again there was some mucosal edema at the site of prior stone impaction without obvious stricturing.  It was clearly felt that given the volume  of stone burden that bilateral stenting with a nontethered stent would be warranted.  As such, new 5 x 24 Polaris-type stents were placed with the remaining safety wires bilaterally using fluoroscopic guidance.  Good proximal and distal planes were  noted, and the procedure was terminated.  The patient tolerated the procedure well.  No immediate perioperative complications.  The patient was taken to postanesthesia care in stable condition with plan for discharge home.  LN/NUANCE  D:01/27/2019 T:01/27/2019 JOB:[id number]

## 2019-02-01 ENCOUNTER — Encounter (HOSPITAL_COMMUNITY): Payer: Self-pay | Admitting: Urology

## 2019-02-08 DIAGNOSIS — E538 Deficiency of other specified B group vitamins: Secondary | ICD-10-CM | POA: Diagnosis not present

## 2019-02-15 DIAGNOSIS — N202 Calculus of kidney with calculus of ureter: Secondary | ICD-10-CM | POA: Diagnosis not present

## 2019-03-04 DIAGNOSIS — N200 Calculus of kidney: Secondary | ICD-10-CM | POA: Diagnosis not present

## 2019-03-05 DIAGNOSIS — N200 Calculus of kidney: Secondary | ICD-10-CM | POA: Diagnosis not present

## 2019-03-11 DIAGNOSIS — E538 Deficiency of other specified B group vitamins: Secondary | ICD-10-CM | POA: Diagnosis not present

## 2019-03-24 DIAGNOSIS — E538 Deficiency of other specified B group vitamins: Secondary | ICD-10-CM | POA: Diagnosis not present

## 2019-03-24 DIAGNOSIS — Z1389 Encounter for screening for other disorder: Secondary | ICD-10-CM | POA: Diagnosis not present

## 2019-03-24 DIAGNOSIS — Z0000 Encounter for general adult medical examination without abnormal findings: Secondary | ICD-10-CM | POA: Diagnosis not present

## 2019-03-24 DIAGNOSIS — I10 Essential (primary) hypertension: Secondary | ICD-10-CM | POA: Diagnosis not present

## 2019-03-24 DIAGNOSIS — D473 Essential (hemorrhagic) thrombocythemia: Secondary | ICD-10-CM | POA: Diagnosis not present

## 2019-03-24 DIAGNOSIS — F5101 Primary insomnia: Secondary | ICD-10-CM | POA: Diagnosis not present

## 2019-03-24 DIAGNOSIS — I2510 Atherosclerotic heart disease of native coronary artery without angina pectoris: Secondary | ICD-10-CM | POA: Diagnosis not present

## 2019-03-30 DIAGNOSIS — R34 Anuria and oliguria: Secondary | ICD-10-CM | POA: Diagnosis not present

## 2019-03-30 DIAGNOSIS — N202 Calculus of kidney with calculus of ureter: Secondary | ICD-10-CM | POA: Diagnosis not present

## 2019-04-12 DIAGNOSIS — Z23 Encounter for immunization: Secondary | ICD-10-CM | POA: Diagnosis not present

## 2019-04-19 DIAGNOSIS — Z1231 Encounter for screening mammogram for malignant neoplasm of breast: Secondary | ICD-10-CM | POA: Diagnosis not present

## 2019-04-19 DIAGNOSIS — Z853 Personal history of malignant neoplasm of breast: Secondary | ICD-10-CM | POA: Diagnosis not present

## 2019-04-22 DIAGNOSIS — N6322 Unspecified lump in the left breast, upper inner quadrant: Secondary | ICD-10-CM | POA: Diagnosis not present

## 2019-04-22 DIAGNOSIS — R928 Other abnormal and inconclusive findings on diagnostic imaging of breast: Secondary | ICD-10-CM | POA: Diagnosis not present

## 2019-04-28 ENCOUNTER — Other Ambulatory Visit: Payer: Self-pay | Admitting: Radiology

## 2019-04-28 DIAGNOSIS — Z0000 Encounter for general adult medical examination without abnormal findings: Secondary | ICD-10-CM | POA: Diagnosis not present

## 2019-04-28 DIAGNOSIS — N6324 Unspecified lump in the left breast, lower inner quadrant: Secondary | ICD-10-CM | POA: Diagnosis not present

## 2019-04-28 DIAGNOSIS — D242 Benign neoplasm of left breast: Secondary | ICD-10-CM | POA: Diagnosis not present

## 2019-04-28 DIAGNOSIS — N6325 Unspecified lump in the left breast, overlapping quadrants: Secondary | ICD-10-CM | POA: Diagnosis not present

## 2019-04-30 ENCOUNTER — Other Ambulatory Visit: Payer: Self-pay | Admitting: Radiology

## 2019-04-30 DIAGNOSIS — C50812 Malignant neoplasm of overlapping sites of left female breast: Secondary | ICD-10-CM | POA: Diagnosis not present

## 2019-04-30 DIAGNOSIS — N6325 Unspecified lump in the left breast, overlapping quadrants: Secondary | ICD-10-CM | POA: Diagnosis not present

## 2019-04-30 DIAGNOSIS — N6324 Unspecified lump in the left breast, lower inner quadrant: Secondary | ICD-10-CM | POA: Diagnosis not present

## 2019-04-30 DIAGNOSIS — Z0000 Encounter for general adult medical examination without abnormal findings: Secondary | ICD-10-CM | POA: Diagnosis not present

## 2019-04-30 DIAGNOSIS — N6322 Unspecified lump in the left breast, upper inner quadrant: Secondary | ICD-10-CM | POA: Diagnosis not present

## 2019-05-04 ENCOUNTER — Ambulatory Visit: Payer: Self-pay | Admitting: General Surgery

## 2019-05-04 DIAGNOSIS — C50412 Malignant neoplasm of upper-outer quadrant of left female breast: Secondary | ICD-10-CM | POA: Diagnosis not present

## 2019-05-05 ENCOUNTER — Other Ambulatory Visit: Payer: Self-pay | Admitting: Internal Medicine

## 2019-05-06 ENCOUNTER — Encounter: Payer: Self-pay | Admitting: *Deleted

## 2019-05-06 NOTE — Progress Notes (Signed)
Location of Breast Cancer: Left Breast  Histology per Pathology Report:  04/30/19 Diagnosis Breast, left, needle core biopsy, 12 o'clock, 3cmfn - INVASIVE MAMMARY CARCINOMA. - MAMMARY CARCINOMA IN SITU  Receptor Status: ER(), PR (), Her2-neu (), Ki-()  Did patient present with symptoms or was this found on screening mammography?: It was found on a screening mammogram.   Past/Anticipated interventions by surgeon, if any: 05/04/19 Dr. Marlou Starks Plans lumpectomy on  06/11/19 Referred to medical and radiation oncology  Past/Anticipated interventions by medical oncology, if any:  Dr. Benay Spice 06/21/19  Lymphedema issues, if any:  N/A  Pain issues, if any:  She denies.  SAFETY ISSUES:  Prior radiation? Yes, right breast 2010, at [facility].   Pacemaker/ICD? No  Possible current pregnancy? No  Is the patient on methotrexate? No  Current Complaints / other details:   1. 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   a. Status post a needle localized lumpectomy and sentinel lymph node biopsy 04/13/2009 with the pathology confirming high-grade ductal carcinoma in situ with an associated 0.26 mm invasive carcinoma, ER positive, PR positive, and HER2 positive. b. Status post adjuvant right breast radiation completed 06/19/2009. c. Initiation of Arimidex 05/01/2009.    [name], Stephani Police, RN 05/06/2019,8:34 AM

## 2019-05-10 ENCOUNTER — Telehealth: Payer: Self-pay | Admitting: Oncology

## 2019-05-10 NOTE — Telephone Encounter (Signed)
Received a new patient referral from Dr. Marcelyn Ditty for breat cancer. Pt has seen Dr. Benay Spice in the past who has agreed to see her again per staff msg. Pt has been cld and scheduled to see Dr. Benay Spice on 9/14 at 2pm. Pt aware to arrive 20 minutes early.

## 2019-05-12 DIAGNOSIS — H524 Presbyopia: Secondary | ICD-10-CM | POA: Diagnosis not present

## 2019-05-12 DIAGNOSIS — H0015 Chalazion left lower eyelid: Secondary | ICD-10-CM | POA: Diagnosis not present

## 2019-05-12 DIAGNOSIS — H26493 Other secondary cataract, bilateral: Secondary | ICD-10-CM | POA: Diagnosis not present

## 2019-05-12 DIAGNOSIS — H52223 Regular astigmatism, bilateral: Secondary | ICD-10-CM | POA: Diagnosis not present

## 2019-05-14 ENCOUNTER — Telehealth: Payer: Self-pay | Admitting: Radiation Oncology

## 2019-05-14 DIAGNOSIS — E538 Deficiency of other specified B group vitamins: Secondary | ICD-10-CM | POA: Diagnosis not present

## 2019-05-14 NOTE — Telephone Encounter (Signed)
Confirmed appt and verified info. °

## 2019-05-17 ENCOUNTER — Ambulatory Visit
Admission: RE | Admit: 2019-05-17 | Discharge: 2019-05-17 | Disposition: A | Discharge status: Home / Self Care | Payer: Medicare Other | Source: Ambulatory Visit | Attending: Radiation Oncology | Admitting: Radiation Oncology

## 2019-05-17 ENCOUNTER — Other Ambulatory Visit: Payer: Self-pay

## 2019-05-17 ENCOUNTER — Encounter: Payer: Self-pay | Admitting: *Deleted

## 2019-05-17 ENCOUNTER — Encounter: Payer: Self-pay | Admitting: Radiation Oncology

## 2019-05-17 ENCOUNTER — Ambulatory Visit: Payer: Self-pay | Admitting: General Surgery

## 2019-05-17 DIAGNOSIS — Z853 Personal history of malignant neoplasm of breast: Secondary | ICD-10-CM | POA: Diagnosis not present

## 2019-05-17 DIAGNOSIS — C50012 Malignant neoplasm of nipple and areola, left female breast: Secondary | ICD-10-CM

## 2019-05-17 DIAGNOSIS — Z170 Estrogen receptor positive status [ER+]: Secondary | ICD-10-CM | POA: Diagnosis not present

## 2019-05-17 DIAGNOSIS — Z923 Personal history of irradiation: Secondary | ICD-10-CM | POA: Diagnosis not present

## 2019-05-17 DIAGNOSIS — C50412 Malignant neoplasm of upper-outer quadrant of left female breast: Secondary | ICD-10-CM | POA: Diagnosis not present

## 2019-05-17 DIAGNOSIS — C50812 Malignant neoplasm of overlapping sites of left female breast: Secondary | ICD-10-CM

## 2019-05-17 DIAGNOSIS — C50912 Malignant neoplasm of unspecified site of left female breast: Secondary | ICD-10-CM

## 2019-05-17 NOTE — Progress Notes (Signed)
Radiation Oncology         [phone] ________________________________  Initial outpatient Consultation by phone, due to pandemic risks. Patient couldn't access WebEx.  Name: [name] MRN: [id number]  Date: 05/17/2019  DOB: 1934/05/01  [id number], Ronie Spies, MD  Jovita Kussmaul, MD   REFERRING PHYSICIAN: Jovita Kussmaul, MD  DIAGNOSIS:    ICD-10-CM   1. Carcinoma of upper-outer quadrant of left breast in female, estrogen receptor positive ([place])  C50.412    Z17.0    Cancer Staging Carcinoma of upper-outer quadrant of left breast in female, estrogen receptor positive ([place]) Staging form: Breast, AJCC 8th Edition - Clinical stage from 05/17/2019: Stage IA (cT1c, cN0, cM0, G3, ER+, PR+, HER2-) - Signed by Eppie Gibson, MD on 05/19/2019   CHIEF COMPLAINT: Here to discuss management of left breast cancer  HISTORY OF PRESENT ILLNESS::[name] is a 85 y.o. female who presented with breast abnormality on the following imaging: screening mammogram at [facility].  Symptoms, if any, at that time, were: none.   Ultrasound of breast on 04-30-19 revealed a 1.3cm mass in the left breast a 12 oclock. I do not have all of the [facility] reports available today.  I understand from Dr Ethlyn Gallery note that the axilla was clinically negative on imaging.  Biopsy on date of 04-30-19 showed Breast, left, needle core biopsy, 12 o'clock, 3cmfn; - INVASIVE MAMMARY CARCINOMA. - MAMMARY CARCINOMA IN SITU.  ER status: +; PR status +, Her2 status neg; Grade 2-3.  She is doing well, anticipates lumpectomy with Dr. 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) (2010), CAD (coronary artery disease), Dysuria, GERD (gastroesophageal reflux disease), Heart murmur, History of breast cancer (ONCOLOGIST-- DR Benay Spice), History of ovarian  cancer, History of skin cancer, Hyperlipidemia, Hypertension, Kidney stones (7/14), Nocturia, Ovarian carcinoma ([place]), Renal stones (2014), Shingles (30 YRS AGO), and Ureteral calculi.    PAST SURGICAL HISTORY: Past Surgical History:  Procedure Laterality Date  . APPENDECTOMY    . BREAST LUMPECTOMY Right 04-13-2009   W/ SLN BX  . CARDIAC CATHETERIZATION  11-11-2005 DR Daneen Schick   MODERATE LAD DISEASE/ NORMAL LVF/ EF 65-75%  . CATARACT EXTRACTION W/ INTRAOCULAR LENS IMPLANT Bilateral 04/26/2013  . CHOLECYSTECTOMY  1990   OPEN  . CYSTOSCOPY W/ URETERAL STENT PLACEMENT Bilateral 05/07/2013   Procedure: CYSTOSCOPY WITH STENT REPLACEMENTS;  Surgeon: Alexis Frock, MD;  Location: [facility];  Service: Urology;  Laterality: Bilateral;  . CYSTOSCOPY WITH BIOPSY N/A 04/07/2013   Procedure: CYSTOSCOPY WITH BIOPSY and fulgeration;  Surgeon: Alexis Frock, MD;  Location: [facility];  Service: Urology;  Laterality: N/A;  . CYSTOSCOPY WITH RETROGRADE PYELOGRAM, URETEROSCOPY AND STENT PLACEMENT Bilateral 05/07/2013   Procedure: CYSTOSCOPY WITH RETROGRADE PYELOGRAM, URETEROSCOPY ;  Surgeon: Alexis Frock, MD;  Location: [facility];  Service: Urology;  Laterality: Bilateral;  . CYSTOSCOPY WITH RETROGRADE PYELOGRAM, URETEROSCOPY AND STENT PLACEMENT Bilateral 01/06/2019   Procedure: CYSTOSCOPY WITH RETROGRADE PYELOGRAM, URETEROSCOPY AND STENT PLACEMENT, FIRST STAGE;  Surgeon: Alexis Frock, MD;  Location: [facility];  Service: Urology;  Laterality: Bilateral;  . CYSTOSCOPY WITH RETROGRADE PYELOGRAM, URETEROSCOPY AND STENT PLACEMENT Bilateral 01/27/2019   Procedure: CYSTOSCOPY WITH RETROGRADE PYELOGRAM, URETEROSCOPY AND STENT PLACEMENT;  Surgeon: Alexis Frock, MD;  Location: [facility];  Service: Urology;  Laterality: Bilateral;  75 MINS  . CYSTOSCOPY  WITH STENT PLACEMENT Bilateral 04/07/2013   Procedure: CYSTOSCOPY WITH STENT PLACEMENT;  Surgeon: Alexis Frock, MD;  Location: [facility];   Service: Urology;  Laterality: Bilateral;  . DX LAPAROSCOPY W/ PERITONEAL AND OMENTAL BX'S AND WASHINGS  02-16-2008  . EXCISION RIGHT BREAST MASS  02-10-2002  . EXP. LAP. EXTENSIVE ADHESIOLYSIS/ RESECTION TERMINAL ILEUM , ASCENDING AND DESCENDING COLON WITH CREATION ILEOSTOMY AND MUCOUS FISTULA  02-19-2008   PERFERATION AND ABD. CANCER--  TAKEDOWN ILEOSTOMY 08-23-2008  . HOLMIUM LASER APPLICATION Bilateral 11/11/4268   Procedure: HOLMIUM LASER APPLICATION;  Surgeon: Alexis Frock, MD;  Location: [facility];  Service: Urology;  Laterality: Bilateral;  . HOLMIUM LASER APPLICATION Bilateral 03/30/7627   Procedure: HOLMIUM LASER APPLICATION;  Surgeon: Alexis Frock, MD;  Location: [facility];  Service: Urology;  Laterality: Bilateral;  . TOTAL ABDOMINAL HYSTERECTOMY W/ BILATERAL SALPINGOOPHORECTOMY  1976  . TOTAL ABDOMINAL HYSTERECTOMY W/ BILATERAL SALPINGOOPHORECTOMY  1976  . VULVAR LESION REMOVAL N/A 08/22/2014   Procedure: EXCISION OF VULVAR CYST  ;  Surgeon: Lyman Speller, MD;  Location: [facility];  Service: Gynecology;  Laterality: N/A;  . WOUND DEBRIDEMENT  05/28/2012   Procedure: DEBRIDEMENT ABDOMINAL WOUND;  Surgeon: Haywood Lasso, MD;  Location: [place];  Service: General;  Laterality: N/A;  excision chronic wound abdominal wall    FAMILY HISTORY: family history includes Breast cancer in her sister; Cancer in her sister; Heart disease in her mother; Hyperlipidemia in her mother.  SOCIAL HISTORY:  reports that she has never smoked. She has never used smokeless tobacco. She reports that she does not drink alcohol or use drugs.  ALLERGIES: Patient has no known allergies.  MEDICATIONS:  Current Outpatient Medications  Medication Sig Dispense Refill  . amLODipine (NORVASC) 10 MG tablet Take 1 tablet (10 mg total) by mouth daily. 90 tablet 1  . aspirin EC 81 MG tablet Take 81 mg by mouth daily.    . BELSOMRA 10 MG TABS     . Biotin 10000 MCG TABS Take 10,000 mcg by mouth  daily.    . Calcium Carbonate-Vitamin D (CALTRATE 600+D PO) Take 1 tablet by mouth 2 (two) times daily.    . carvedilol (COREG) 25 MG tablet Take 1 tablet (25 mg total) by mouth 2 (two) times daily with a meal. Please keep 7/23 appointment for additional refills thanks. 180 tablet 0  . Cholecalciferol (VITAMIN D3) 50 MCG (2000 UT) TABS Take 2,000 Units by mouth daily.    . cloNIDine (CATAPRES) 0.1 MG tablet Take 1 tablet (0.1 mg total) by mouth 2 (two) times daily. Please keep upcoming appointment 7/23 for additional refills thanks. 180 tablet 0  . PROCTOZONE-HC 2.5 % rectal cream PLACE 1 APPLICATION RECTALLY 2 TIMES A DAY AS NEEDED FOR HEMORRHOIDS. (Patient taking differently: Place 1 application rectally 2 (two) times daily as needed for hemorrhoids. ) 60 g 11   No current facility-administered medications for this encounter.     REVIEW OF SYSTEMS: As above   PHYSICAL EXAM:  vitals were not taken for this visit.   General: Alert and oriented, in no acute distress  LABORATORY DATA:  Lab Results  Component Value Date   WBC 9.0 01/26/2019   HGB 12.2 01/26/2019   HCT 36.6 01/26/2019   MCV 96.1 01/26/2019   PLT 449 (H) 01/26/2019   CMP     Component Value Date/Time   NA 141 01/26/2019 1434   K 4.2 01/26/2019 1434   CL 111 01/26/2019 1434   CO2  21 (L) 01/26/2019 1434   GLUCOSE 123 (H) 01/26/2019 1434   BUN 39 (H) 01/26/2019 1434   CREATININE 1.47 (H) 01/26/2019 1434   CREATININE 1.44 (H) 08/01/2016 0924   CALCIUM 8.8 (L) 01/26/2019 1434   PROT 6.1 04/11/2009 1015   ALBUMIN 3.3 (L) 04/11/2009 1015   AST 22 04/11/2009 1015   ALT 41 (H) 04/11/2009 1015   ALKPHOS 101 04/11/2009 1015   BILITOT 0.7 04/11/2009 1015   GFRNONAA 33 (L) 01/26/2019 1434   GFRAA 38 (L) 01/26/2019 1434        RADIOGRAPHY: As above  IMPRESSION/PLAN: Prior right  breast cancer and prior ovarian cancer.  Now with a stage I left breast cancer that is ER positive  I offered a referral to genetic  counseling and the patient is agreeable to that.  For the patient's early stage favorable risk breast cancer, we had a thorough discussion about her options for adjuvant therapy. One option would be antiestrogen therapy as discussed with medical oncology. She would take a pill for approximately 5 years. The alternative option would be radiotherapy to the breast if she is not amenable to taking the pill. The most aggressive option would be to pursue both modalities.  Of note, I discussed the data from the [employer] al trial in the [place] [facility]. She understands that tamoxifen compared to radiation plus tamoxifen demonstrated no survival benefit among the women in this study. The women were 56 years or older with stage I estrogen receptor positive breast cancer. Extrapolating from this study, I told her that her overall life expectancy should not be affected by adding radiotherapy to antiestrogen medication. She understands that the main benefit of radiotherapy would be a very small but measurable local control benefit (risk of local recurrence to be lowered from ~10% --> ~2%).   We discussed the risks benefits and side effects of radiotherapy. She understands that the side effects would likely include some skin irritation and fatigue during the weeks of radiation. There is a risk of late effects which include but are not necessarily limited to cosmetic changes and rare heart or lung toxicity. I would anticipate delivering approximately 4 weeks of radiotherapy.  She has a pending consultation with medical oncology and will think about her options.  I am happy to see her back after surgery to discuss her final pathology and her preferences for adjuvant therapy.  This encounter was provided by telemedicine platform phone as the patient could not access WebEx - the patient has given verbal consent for this type of encounter and has been advised to only accept a meeting of this type in a secure  network environment. The time spent during this encounter was over 20 minutes. The attendants for this meeting include Eppie Gibson  and [name].  During the encounter, Eppie Gibson was located at [facility] Radiation Oncology Department.  Rexann Lueras Leatham was located at home.     __________________________________________   Eppie Gibson, MD

## 2019-05-19 ENCOUNTER — Other Ambulatory Visit: Payer: Self-pay | Admitting: Radiation Oncology

## 2019-05-19 ENCOUNTER — Encounter: Payer: Self-pay | Admitting: Radiation Oncology

## 2019-05-19 DIAGNOSIS — C50412 Malignant neoplasm of upper-outer quadrant of left female breast: Secondary | ICD-10-CM | POA: Insufficient documentation

## 2019-05-19 DIAGNOSIS — Z170 Estrogen receptor positive status [ER+]: Secondary | ICD-10-CM

## 2019-06-01 NOTE — Progress Notes (Signed)
Cardiology Office Note:    Date:  06/02/2019   ID:  [name], DOB April 19, 1934, MRN [id number]  PCP:  Leeroy Cha, MD  Cardiologist:  Sinclair Grooms, MD   Referring MD: Leeroy Cha,*   Chief Complaint  Patient presents with  . Coronary Artery Disease  . Chest Pain  . Hypertension  . Hyperlipidemia    History of Present Illness:    [name] is a 85 y.o. female with a hx of moderate CAD,  hypertension, hyperlipidemia, and premature atrial contractions.  [name] has left breast cancer and is undergoing lumpectomy in early September.  She had right breast cancer 10 years ago.  Her cardiac concerns are that on occasion if she does not sleep well the previous night, she will be tired and will lie on her sofa.  Occasionally when she lays down on the sofa she feels tightness in her chest.  This happens at no other time including moderate to heavy physical activity.  Sitting up allows the discomfort to resolve quickly.  She is not having lower extremity swelling or other cardiac issues.  Her legs and feet feel cold and weak in the morning.  She does not have any discomfort with ambulation and there is been no swelling in the lower extremities.  Past Medical History:  Diagnosis Date  . Arthritis    HANDS AND BACK  . 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 Breast cancer ([place]) 2010   RIGHT  . CAD (coronary artery disease)   . Dysuria   . GERD (gastroesophageal reflux disease)    HX OF  . Heart murmur   . History of breast cancer ONCOLOGIST-- DR Benay Spice   DX 2010--  RIGHT BREAST DCIS  HIGH GRADE S/P LUMPECTOMY W/ SLN BX--  NO RECURRENCE  . 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 History of skin cancer    excision basal cell  . Hyperlipidemia   . Hypertension   . Kidney stones 7/14  .  Nocturia   . 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 Renal stones 2014  . Shingles 30 YRS AGO  . Ureteral calculi    bilateral    Past Surgical History:  Procedure Laterality Date  . APPENDECTOMY    . BREAST LUMPECTOMY Right 04-13-2009   W/ SLN BX  . CARDIAC CATHETERIZATION  11-11-2005 DR Daneen Schick   MODERATE LAD DISEASE/ NORMAL LVF/ EF 65-75%  . CATARACT EXTRACTION W/ INTRAOCULAR LENS IMPLANT Bilateral 04/26/2013  . CHOLECYSTECTOMY  1990   OPEN  . CYSTOSCOPY W/ URETERAL STENT PLACEMENT Bilateral 05/07/2013   Procedure: CYSTOSCOPY WITH STENT REPLACEMENTS;  Surgeon: Alexis Frock, MD;  Location: [facility];  Service: Urology;  Laterality: Bilateral;  . CYSTOSCOPY WITH BIOPSY N/A 04/07/2013   Procedure: CYSTOSCOPY WITH BIOPSY and fulgeration;  Surgeon: Alexis Frock, MD;  Location: [facility];  Service: Urology;  Laterality: N/A;  . CYSTOSCOPY WITH RETROGRADE PYELOGRAM, URETEROSCOPY AND STENT PLACEMENT Bilateral 05/07/2013   Procedure: CYSTOSCOPY WITH RETROGRADE PYELOGRAM, URETEROSCOPY ;  Surgeon: Alexis Frock, MD;  Location: [facility];  Service: Urology;  Laterality: Bilateral;  . CYSTOSCOPY WITH RETROGRADE PYELOGRAM, URETEROSCOPY AND STENT PLACEMENT Bilateral 01/06/2019   Procedure: CYSTOSCOPY WITH RETROGRADE PYELOGRAM, URETEROSCOPY AND STENT PLACEMENT, FIRST STAGE;  Surgeon: Tresa Moore,  Hubbard Robinson, MD;  Location: [facility];  Service: Urology;  Laterality: Bilateral;  . CYSTOSCOPY WITH RETROGRADE PYELOGRAM, URETEROSCOPY AND STENT PLACEMENT Bilateral 01/27/2019   Procedure: CYSTOSCOPY WITH RETROGRADE PYELOGRAM, URETEROSCOPY AND STENT PLACEMENT;  Surgeon: Alexis Frock, MD;  Location: [facility];  Service: Urology;  Laterality: Bilateral;  75 MINS  . CYSTOSCOPY WITH STENT PLACEMENT Bilateral 04/07/2013   Procedure: CYSTOSCOPY WITH STENT PLACEMENT;  Surgeon: Alexis Frock, MD;  Location: [facility];  Service:  Urology;  Laterality: Bilateral;  . DX LAPAROSCOPY W/ PERITONEAL AND OMENTAL BX'S AND WASHINGS  02-16-2008  . EXCISION RIGHT BREAST MASS  02-10-2002  . EXP. LAP. EXTENSIVE ADHESIOLYSIS/ RESECTION TERMINAL ILEUM , ASCENDING AND DESCENDING COLON WITH CREATION ILEOSTOMY AND MUCOUS FISTULA  02-19-2008   PERFERATION AND ABD. CANCER--  TAKEDOWN ILEOSTOMY 08-23-2008  . HOLMIUM LASER APPLICATION Bilateral [id number]   Procedure: HOLMIUM LASER APPLICATION;  Surgeon: Alexis Frock, MD;  Location: [facility];  Service: Urology;  Laterality: Bilateral;  . HOLMIUM LASER APPLICATION Bilateral [id number]   Procedure: HOLMIUM LASER APPLICATION;  Surgeon: Alexis Frock, MD;  Location: [facility];  Service: Urology;  Laterality: Bilateral;  . TOTAL ABDOMINAL HYSTERECTOMY W/ BILATERAL SALPINGOOPHORECTOMY  1976  . TOTAL ABDOMINAL HYSTERECTOMY W/ BILATERAL SALPINGOOPHORECTOMY  1976  . VULVAR LESION REMOVAL N/A 08/22/2014   Procedure: EXCISION OF VULVAR CYST  ;  Surgeon: Lyman Speller, MD;  Location: [facility];  Service: Gynecology;  Laterality: N/A;  . WOUND DEBRIDEMENT  05/28/2012   Procedure: DEBRIDEMENT ABDOMINAL WOUND;  Surgeon: Haywood Lasso, MD;  Location: [place];  Service: General;  Laterality: N/A;  excision chronic wound abdominal wall    Current Medications: Current Meds  Medication Sig  . amLODipine (NORVASC) 10 MG tablet Take 1 tablet (10 mg total) by mouth daily.  Marland Kitchen aspirin EC 81 MG tablet Take 81 mg by mouth daily.  . BELSOMRA 10 MG TABS Take 10 mg by mouth at bedtime as needed (sleep).   . Biotin 10000 MCG TABS Take 10,000 mcg by mouth daily.  . Calcium Carbonate-Vitamin D (CALTRATE 600+D PO) Take 1 tablet by mouth 2 (two) times daily.  . carvedilol (COREG) 25 MG tablet Take 1 tablet (25 mg total) by mouth 2 (two) times daily with a meal. Please keep 7/23 appointment for additional refills thanks.  . Cholecalciferol (VITAMIN D3) 50 MCG (2000 UT) TABS Take 2,000 Units by mouth  daily with lunch.   . cloNIDine (CATAPRES) 0.1 MG tablet Take 1 tablet (0.1 mg total) by mouth 2 (two) times daily. Please keep upcoming appointment 7/23 for additional refills thanks.  . Multiple Vitamin (MULTIVITAMIN WITH MINERALS) TABS tablet Take 1 tablet by mouth daily.  Marland Kitchen olmesartan (BENICAR) 40 MG tablet Take 40 mg by mouth every evening.  Marland Kitchen PROCTOZONE-HC 2.5 % rectal cream PLACE 1 APPLICATION RECTALLY 2 TIMES A DAY AS NEEDED FOR HEMORRHOIDS.     Allergies:   Patient has no known allergies.   Social History   Socioeconomic History  . Marital status: Married    Spouse name: Not on file  . Number of children: Not on file  . Years of education: Not on file  . Highest education level: Not on file  Occupational History  . Not on file  Social Needs  . Financial resource strain: Not on file  . Food insecurity    Worry: Not on file    Inability: Not on file  . Transportation needs    Medical: No    Non-medical: No  Tobacco Use  . Smoking status: Never Smoker  . Smokeless tobacco: Never Used  Substance and Sexual Activity  . Alcohol use: No  . Drug use: No  . Sexual activity: Never    Partners: Male    Birth control/protection: Surgical    Comment: Hysterectomy  Lifestyle  . Physical activity    Days per week: Not on file    Minutes per session: Not on file  . Stress: Not on file  Relationships  . Social Herbalist on phone: Not on file    Gets together: Not on file    Attends religious service: Not on file    Active member of club or organization: Not on file    Attends meetings of clubs or organizations: Not on file    Relationship status: Not on file  Other Topics Concern  . Not on file  Social History Narrative  . Not on file     Family History: The patient's family history includes Breast cancer in her sister; Cancer in her sister; Heart disease in her mother; Hyperlipidemia in her mother.  ROS:   Please see the history of present illness.     She has 1 granddaughter who is 69 years old and she is sad that she has not been able to see her recently.  She has been told by primary care she has a slight reduction in her hemoglobin with elevated platelets.  They are watching this for the time being.  As mentioned above she has left breast cancer and will have lumpectomy.  Feet remain cold all the time.  All other systems reviewed and are negative.  EKGs/Labs/Other Studies Reviewed:    The following studies were reviewed today: nO NEW CARDIAC DATA  EKG:  EKG performed on 01/04/2019 demonstrating sinus bradycardia, PACs, poor R wave progression V1 through V4.  Recent Labs: 01/26/2019: BUN 39; Creatinine, Ser 1.47; Hemoglobin 12.2; Platelets 449; Potassium 4.2; Sodium 141  Recent Lipid Panel    Component Value Date/Time   CHOL  02/29/2008 0515    61        ATP III CLASSIFICATION:  <200     mg/dL   Desirable  200-239  mg/dL   Borderline High  >=240    mg/dL   High   TRIG 110 02/29/2008 0515    Physical Exam:    VS:  BP (!) 116/58   Pulse 62   Ht 5\' 7"  (1.702 m)   Wt 131 lb 12.8 oz (59.8 kg)   LMP 10/07/1974   SpO2 96%   BMI 20.64 kg/m     Wt Readings from Last 3 Encounters:  06/02/19 131 lb 12.8 oz (59.8 kg)  01/27/19 136 lb 9 oz (61.9 kg)  01/26/19 136 lb 9 oz (61.9 kg)     GEN: Slender, younger than stated age in appearance.. No acute distress HEENT: Normal NECK: No JVD. LYMPHATICS: No lymphadenopathy CARDIAC:  RRR without murmur, gallop, or edema. VASCULAR:  Normal Pulses. No bruits. RESPIRATORY:  Clear to auscultation without rales, wheezing or rhonchi  ABDOMEN: Soft, non-tender, non-distended, No pulsatile mass, MUSCULOSKELETAL: No deformity  SKIN: Warm and dry NEUROLOGIC:  Alert and oriented x 3 PSYCHIATRIC:  Normal affect   ASSESSMENT:    1. Essential hypertension   2. Coronary artery disease involving native coronary artery of native heart without angina pectoris   3. Other hyperlipidemia   4.  Bilateral carotid artery stenosis   5. Educated About Covid-19 Virus Infection  6. Preop cardiovascular exam    PLAN:    In order of problems listed above:  1. Blood pressure is under good control.  I repeated her recording for today and it is 138/70 while sitting. 2. Secondary prevention discussed. 3. LDL target less than 70.  When last evaluated was 53 in June 2020.  Triglyceride is elevated at 282 and could be treated with vascepa. 4. Secondary prevention is advocated 5. Social distancing, masking, and handwashing is stressed. 6. She is clear for the upcoming breast surgery and should be low risk.  Overall education and awareness concerning primary/secondary risk prevention was discussed in detail: LDL less than 70, hemoglobin A1c less than 7, blood pressure target less than 130/80 mmHg, >150 minutes of moderate aerobic activity per week, avoidance of smoking, weight control (via diet and exercise), and continued surveillance/management of/for obstructive sleep apnea.    Medication Adjustments/Labs and Tests Ordered: Current medicines are reviewed at length with the patient today.  Concerns regarding medicines are outlined above.  No orders of the defined types were placed in this encounter.  No orders of the defined types were placed in this encounter.   Patient Instructions  Medication Instructions:  Your physician recommends that you continue on your current medications as directed. Please refer to the Current Medication list given to you today.  If you need a refill on your cardiac medications before your next appointment, please call your pharmacy.   Lab work: None If you have labs (blood work) drawn today and your tests are completely normal, you will receive your results only by: Marland Kitchen MyChart Message (if you have MyChart) OR . A paper copy in the mail If you have any lab test that is abnormal or we need to change your treatment, we will call you to review the results.   Testing/Procedures: None  Follow-Up: At [facility], you and your health needs are our priority.  As part of our continuing mission to provide you with exceptional heart care, we have created designated Provider Care Teams.  These Care Teams include your primary Cardiologist (physician) and Advanced Practice Providers (APPs -  Physician Assistants and Nurse Practitioners) who all work together to provide you with the care you need, when you need it. You will need a follow up appointment in 12 months.  Please call our office 2 months in advance to schedule this appointment.  You may see Sinclair Grooms, MD or one of the following Advanced Practice Providers on your designated Care Team:   Truitt Merle, NP Cecilie Kicks, NP . Kathyrn Drown, NP  Any Other Special Instructions Will Be Listed Below (If Applicable).       Signed, Sinclair Grooms, MD  06/02/2019 11:15 AM    [place]

## 2019-06-02 ENCOUNTER — Ambulatory Visit (INDEPENDENT_AMBULATORY_CARE_PROVIDER_SITE_OTHER): Payer: Medicare Other | Admitting: Interventional Cardiology

## 2019-06-02 ENCOUNTER — Other Ambulatory Visit: Payer: Self-pay

## 2019-06-02 ENCOUNTER — Encounter: Payer: Self-pay | Admitting: Interventional Cardiology

## 2019-06-02 VITALS — BP 116/58 | HR 62 | Ht 67.0 in | Wt 131.8 lb

## 2019-06-02 DIAGNOSIS — I6523 Occlusion and stenosis of bilateral carotid arteries: Secondary | ICD-10-CM | POA: Diagnosis not present

## 2019-06-02 DIAGNOSIS — E7849 Other hyperlipidemia: Secondary | ICD-10-CM | POA: Diagnosis not present

## 2019-06-02 DIAGNOSIS — I10 Essential (primary) hypertension: Secondary | ICD-10-CM

## 2019-06-02 DIAGNOSIS — Z01810 Encounter for preprocedural cardiovascular examination: Secondary | ICD-10-CM

## 2019-06-02 DIAGNOSIS — I2510 Atherosclerotic heart disease of native coronary artery without angina pectoris: Secondary | ICD-10-CM | POA: Diagnosis not present

## 2019-06-02 DIAGNOSIS — Z7189 Other specified counseling: Secondary | ICD-10-CM

## 2019-06-02 NOTE — Patient Instructions (Signed)
Medication Instructions:  Your physician recommends that you continue on your current medications as directed. Please refer to the Current Medication list given to you today.  If you need a refill on your cardiac medications before your next appointment, please call your pharmacy.   Lab work: None If you have labs (blood work) drawn today and your tests are completely normal, you will receive your results only by: . MyChart Message (if you have MyChart) OR . A paper copy in the mail If you have any lab test that is abnormal or we need to change your treatment, we will call you to review the results.  Testing/Procedures: None  Follow-Up: At [facility], you and your health needs are our priority.  As part of our continuing mission to provide you with exceptional heart care, we have created designated Provider Care Teams.  These Care Teams include your primary Cardiologist (physician) and Advanced Practice Providers (APPs -  Physician Assistants and Nurse Practitioners) who all work together to provide you with the care you need, when you need it. You will need a follow up appointment in 12 months.  Please call our office 2 months in advance to schedule this appointment.  You may see Henry W Smith III, MD or one of the following Advanced Practice Providers on your designated Care Team:   Lori Gerhardt, NP Laura Ingold, NP . Jill McDaniel, NP  Any Other Special Instructions Will Be Listed Below (If Applicable).    

## 2019-06-04 NOTE — Pre-Procedure Instructions (Signed)
[place], [place] [address] Phone: [phone] Fax: [phone]      Your procedure is scheduled on  06-11-19  Report to [facility] Main Entrance "A" at 0630 AM A.M., and check in at the Admitting office.  Call this number if you have problems the morning of surgery:  [phone]  Call [phone] if you have any questions prior to your surgery date Monday-Friday 8am-4pm    Remember:  Do not eat or drink after midnight the night before your surgery  You may drink clear liquids until 0530 am the morning of your surgery.   Clear liquids allowed are: Water, Non-Citrus Juices (without pulp), Carbonated Beverages, Clear Tea, Black Coffee Only, and Gatorade   Take these medicines the morning of surgery with A SIP OF WATER : amLODipine (NORVASC) carvedilol (COREG) cloNIDine (CATAPRES)   Follow your surgeon's instructions on when to stop Aspirin.  If no instructions were given by your surgeon then you will need to call the office to get those instructions.    7 days prior to surgery STOP taking any Aspirin (unless otherwise instructed by your surgeon), Aleve, Naproxen, Ibuprofen, Motrin, Advil, Goody's, BC's, all herbal medications, fish oil, and all vitamins.    The Morning of Surgery  Do not wear jewelry, make-up or nail polish.  Do not wear lotions, powders, or perfumes, or deodorant  Do not shave 48 hours prior to surgery.   Do not bring valuables to the hospital.  [facility] is not responsible for any belongings or valuables.  If you are a smoker, DO NOT Smoke 24 hours prior to surgery IF you wear a CPAP at night please bring your mask, tubing, and machine the morning of surgery   Remember that you must have someone to transport you home after your surgery, and remain with you for 24 hours if you are discharged the same day.   Contacts, glasses, hearing aids, dentures or bridgework may not  be worn into surgery.    Leave your suitcase in the car.  After surgery it may be brought to your room.  For patients admitted to the hospital, discharge time will be determined by your treatment team.  Patients discharged the day of surgery will not be allowed to drive home.    Special instructions:   [place]- Preparing For Surgery  Before surgery, you can play an important role. Because skin is not sterile, your skin needs to be as free of germs as possible. You can reduce the number of germs on your skin by washing with CHG (chlorahexidine gluconate) Soap before surgery.  CHG is an antiseptic cleaner which kills germs and bonds with the skin to continue killing germs even after washing.    Oral Hygiene is also important to reduce your risk of infection.  Remember - BRUSH YOUR TEETH THE MORNING OF SURGERY WITH YOUR REGULAR TOOTHPASTE  Please do not use if you have an allergy to CHG or antibacterial soaps. If your skin becomes reddened/irritated stop using the CHG.  Do not shave (including legs and underarms) for at least 48 hours prior to first CHG shower. It is OK to shave your face.  Please follow these instructions carefully.   1. Shower the NIGHT BEFORE SURGERY and the MORNING OF SURGERY with CHG Soap.   2. If you chose to wash your hair, wash your hair first as usual with your normal shampoo.  3.  After you shampoo, rinse your hair and body thoroughly to remove the shampoo.  4. Use CHG as you would any other liquid soap. You can apply CHG directly to the skin and wash gently with a scrungie or a clean washcloth.   5. Apply the CHG Soap to your body ONLY FROM THE NECK DOWN.  Do not use on open wounds or open sores. Avoid contact with your eyes, ears, mouth and genitals (private parts). Wash Face and genitals (private parts)  with your normal soap.   6. Wash thoroughly, paying special attention to the area where your surgery will be performed.  7. Thoroughly rinse your body  with warm water from the neck down.  8. DO NOT shower/wash with your normal soap after using and rinsing off the CHG Soap.  9. Pat yourself dry with a CLEAN TOWEL.  10. Wear CLEAN PAJAMAS to bed the night before surgery, wear comfortable clothes the morning of surgery  11. Place CLEAN SHEETS on your bed the night of your first shower and DO NOT SLEEP WITH PETS.    Day of Surgery:  Do not apply any deodorants/lotions. Please shower the morning of surgery with the CHG soap  Please wear clean clothes to the hospital/surgery center.   Remember to brush your teeth WITH YOUR REGULAR TOOTHPASTE.   Please read over the following fact sheets that you were given.

## 2019-06-07 ENCOUNTER — Encounter (HOSPITAL_COMMUNITY)
Admission: RE | Admit: 2019-06-07 | Discharge: 2019-06-07 | Disposition: A | Discharge status: Home / Self Care | Payer: Medicare Other | Source: Ambulatory Visit | Attending: General Surgery | Admitting: General Surgery

## 2019-06-07 ENCOUNTER — Other Ambulatory Visit: Payer: Self-pay

## 2019-06-07 ENCOUNTER — Encounter (HOSPITAL_COMMUNITY): Payer: Self-pay

## 2019-06-07 DIAGNOSIS — Z01812 Encounter for preprocedural laboratory examination: Secondary | ICD-10-CM | POA: Diagnosis not present

## 2019-06-07 DIAGNOSIS — I2510 Atherosclerotic heart disease of native coronary artery without angina pectoris: Secondary | ICD-10-CM | POA: Diagnosis not present

## 2019-06-07 DIAGNOSIS — I10 Essential (primary) hypertension: Secondary | ICD-10-CM | POA: Diagnosis not present

## 2019-06-07 DIAGNOSIS — D510 Vitamin B12 deficiency anemia due to intrinsic factor deficiency: Secondary | ICD-10-CM | POA: Diagnosis not present

## 2019-06-07 DIAGNOSIS — M1990 Unspecified osteoarthritis, unspecified site: Secondary | ICD-10-CM | POA: Diagnosis not present

## 2019-06-07 DIAGNOSIS — E785 Hyperlipidemia, unspecified: Secondary | ICD-10-CM | POA: Diagnosis not present

## 2019-06-07 DIAGNOSIS — C50919 Malignant neoplasm of unspecified site of unspecified female breast: Secondary | ICD-10-CM | POA: Diagnosis not present

## 2019-06-07 HISTORY — DX: Personal history of urinary calculi: Z87.442

## 2019-06-07 HISTORY — DX: Personal history of other medical treatment: Z92.89

## 2019-06-07 LAB — BASIC METABOLIC PANEL
Anion gap: 7 (ref 5–15)
BUN: 32 mg/dL — ABNORMAL HIGH (ref 8–23)
CO2: 24 mmol/L (ref 22–32)
Calcium: 9.5 mg/dL (ref 8.9–10.3)
Chloride: 110 mmol/L (ref 98–111)
Creatinine, Ser: 1.3 mg/dL — ABNORMAL HIGH (ref 0.44–1.00)
GFR calc Af Amer: 44 mL/min — ABNORMAL LOW (ref >60)
GFR calc non Af Amer: 38 mL/min — ABNORMAL LOW (ref >60)
Glucose, Bld: 128 mg/dL — ABNORMAL HIGH (ref 70–99)
Potassium: 4.3 mmol/L (ref 3.5–5.1)
Sodium: 141 mmol/L (ref 135–145)

## 2019-06-07 LAB — CBC
HCT: 43.4 % (ref 36.0–46.0)
Hemoglobin: 14.2 g/dL (ref 12.0–15.0)
MCH: 31.6 pg (ref 26.0–34.0)
MCHC: 32.7 g/dL (ref 30.0–36.0)
MCV: 96.4 fL (ref 80.0–100.0)
Platelets: 516 K/uL — ABNORMAL HIGH (ref 150–400)
RBC: 4.5 MIL/uL (ref 3.87–5.11)
RDW: 14.1 % (ref 11.5–15.5)
WBC: 10.3 K/uL (ref 4.0–10.5)
nRBC: 0 % (ref 0.0–0.2)

## 2019-06-07 NOTE — Progress Notes (Signed)
PCP - Dr. Jossie Ng  Cardiologist - Dr. Daneen Schick  Chest x-ray - na  EKG - 12/2018  Stress Test - no ECHO - no Cardiac Cath - no  Sleep Study - no CPAP - no  LABS- CBC, BMP  ASA-81 mg, I instructed patient to call surgeon   ERAS-yes- no beverage ordered  HA1C-na Fasting Blood Sugar - na Checks Blood Sugar __0___ times a day  Anesthesia-  Pt denies having chest pain, sob, or fever at this time. All instructions explained to the pt, with a verbal understanding of the material. Pt agrees to go over the instructions while at home for a better understanding. Pt also instructed to self quarantine after being tested for COVID-19. The opportunity to ask questions was provided.

## 2019-06-08 ENCOUNTER — Other Ambulatory Visit (HOSPITAL_COMMUNITY)
Admission: RE | Admit: 2019-06-08 | Discharge: 2019-06-08 | Disposition: A | Discharge status: Home / Self Care | Payer: Medicare Other | Source: Ambulatory Visit | Attending: General Surgery | Admitting: General Surgery

## 2019-06-08 DIAGNOSIS — Z01812 Encounter for preprocedural laboratory examination: Secondary | ICD-10-CM | POA: Diagnosis not present

## 2019-06-08 LAB — SARS CORONAVIRUS 2 (TAT 6-24 HRS): SARS Coronavirus 2: NEGATIVE

## 2019-06-10 DIAGNOSIS — C50812 Malignant neoplasm of overlapping sites of left female breast: Secondary | ICD-10-CM | POA: Diagnosis not present

## 2019-06-10 DIAGNOSIS — Z803 Family history of malignant neoplasm of breast: Secondary | ICD-10-CM | POA: Diagnosis not present

## 2019-06-10 DIAGNOSIS — Z8543 Personal history of malignant neoplasm of ovary: Secondary | ICD-10-CM | POA: Diagnosis not present

## 2019-06-10 NOTE — Anesthesia Preprocedure Evaluation (Addendum)
Anesthesia Evaluation  Patient identified by MRN, date of birth, ID band Patient awake    Reviewed: Allergy & Precautions, NPO status , Patient's Chart, lab work & pertinent test results  Airway Mallampati: II  TM Distance: >3 FB Neck ROM: Full    Dental  (+) Dental Advisory Given, Teeth Intact   Pulmonary neg pulmonary ROS,    breath sounds clear to auscultation       Cardiovascular hypertension, Pt. on medications and Pt. on home beta blockers + CAD   Rhythm:Regular Rate:Normal     Neuro/Psych negative neurological ROS     GI/Hepatic Neg liver ROS, GERD  ,  Endo/Other  negative endocrine ROS  Renal/GU Renal InsufficiencyRenal disease     Musculoskeletal  (+) Arthritis ,   Abdominal   Peds  Hematology negative hematology ROS (+)   Anesthesia Other Findings   Reproductive/Obstetrics                          Lab Results  Component Value Date   WBC 10.3 06/07/2019   HGB 14.2 06/07/2019   HCT 43.4 06/07/2019   MCV 96.4 06/07/2019   PLT 516 (H) 06/07/2019   Lab Results  Component Value Date   CREATININE 1.30 (H) 06/07/2019   BUN 32 (H) 06/07/2019   NA 141 06/07/2019   K 4.3 06/07/2019   CL 110 06/07/2019   CO2 24 06/07/2019    Anesthesia Physical Anesthesia Plan  ASA: II  Anesthesia Plan: General   Post-op Pain Management:    Induction: Intravenous  PONV Risk Score and Plan: 3 and Dexamethasone, Ondansetron and Treatment may vary due to age or medical condition  Airway Management Planned: LMA  Additional Equipment:   Intra-op Plan:   Post-operative Plan: Extubation in OR  Informed Consent: I have reviewed the patients History and Physical, chart, labs and discussed the procedure including the risks, benefits and alternatives for the proposed anesthesia with the patient or authorized representative who has indicated his/her understanding and acceptance.     Dental  advisory given  Plan Discussed with: CRNA  Anesthesia Plan Comments:        Anesthesia Quick Evaluation

## 2019-06-11 ENCOUNTER — Ambulatory Visit (HOSPITAL_COMMUNITY): Payer: Medicare Other | Admitting: Anesthesiology

## 2019-06-11 ENCOUNTER — Ambulatory Visit (HOSPITAL_COMMUNITY)
Admission: RE | Admit: 2019-06-11 | Discharge: 2019-06-11 | Disposition: A | Discharge status: Home / Self Care | Payer: Medicare Other | Attending: General Surgery | Admitting: General Surgery

## 2019-06-11 ENCOUNTER — Encounter (HOSPITAL_COMMUNITY)
Admission: RE | Disposition: A | Discharge status: Home / Self Care | Payer: Self-pay | Source: Home / Self Care | Attending: General Surgery

## 2019-06-11 ENCOUNTER — Other Ambulatory Visit: Payer: Self-pay

## 2019-06-11 ENCOUNTER — Encounter (HOSPITAL_COMMUNITY): Payer: Self-pay

## 2019-06-11 DIAGNOSIS — I2510 Atherosclerotic heart disease of native coronary artery without angina pectoris: Secondary | ICD-10-CM | POA: Insufficient documentation

## 2019-06-11 DIAGNOSIS — I10 Essential (primary) hypertension: Secondary | ICD-10-CM | POA: Diagnosis not present

## 2019-06-11 DIAGNOSIS — Z79899 Other long term (current) drug therapy: Secondary | ICD-10-CM | POA: Insufficient documentation

## 2019-06-11 DIAGNOSIS — Z923 Personal history of irradiation: Secondary | ICD-10-CM | POA: Insufficient documentation

## 2019-06-11 DIAGNOSIS — Z170 Estrogen receptor positive status [ER+]: Secondary | ICD-10-CM | POA: Diagnosis not present

## 2019-06-11 DIAGNOSIS — E785 Hyperlipidemia, unspecified: Secondary | ICD-10-CM | POA: Diagnosis not present

## 2019-06-11 DIAGNOSIS — C50412 Malignant neoplasm of upper-outer quadrant of left female breast: Secondary | ICD-10-CM | POA: Insufficient documentation

## 2019-06-11 DIAGNOSIS — Z803 Family history of malignant neoplasm of breast: Secondary | ICD-10-CM | POA: Diagnosis not present

## 2019-06-11 DIAGNOSIS — Z8543 Personal history of malignant neoplasm of ovary: Secondary | ICD-10-CM | POA: Insufficient documentation

## 2019-06-11 DIAGNOSIS — D0512 Intraductal carcinoma in situ of left breast: Secondary | ICD-10-CM | POA: Diagnosis not present

## 2019-06-11 DIAGNOSIS — Z7982 Long term (current) use of aspirin: Secondary | ICD-10-CM | POA: Insufficient documentation

## 2019-06-11 DIAGNOSIS — C50912 Malignant neoplasm of unspecified site of left female breast: Secondary | ICD-10-CM | POA: Diagnosis not present

## 2019-06-11 DIAGNOSIS — C50812 Malignant neoplasm of overlapping sites of left female breast: Secondary | ICD-10-CM | POA: Diagnosis not present

## 2019-06-11 HISTORY — PX: BREAST LUMPECTOMY WITH RADIOACTIVE SEED LOCALIZATION: SHX6424

## 2019-06-11 SURGERY — BREAST LUMPECTOMY WITH RADIOACTIVE SEED LOCALIZATION
Anesthesia: General | Site: Breast | Laterality: Left

## 2019-06-11 MED ORDER — EPHEDRINE SULFATE-NACL 50-0.9 MG/10ML-% IV SOSY
PREFILLED_SYRINGE | INTRAVENOUS | Status: DC | PRN
  Administered 2019-06-11 (×3): 5 mg via INTRAVENOUS
  Administered 2019-06-11: 10 mg via INTRAVENOUS
  Administered 2019-06-11: 5 mg via INTRAVENOUS

## 2019-06-11 MED ORDER — PROPOFOL 10 MG/ML IV BOLUS
INTRAVENOUS | Status: DC | PRN
  Administered 2019-06-11: 150 mg via INTRAVENOUS

## 2019-06-11 MED ORDER — PROMETHAZINE HCL 25 MG/ML IJ SOLN: 6.2500 mg | INTRAMUSCULAR | Status: DC | PRN

## 2019-06-11 MED ORDER — FENTANYL CITRATE (PF) 250 MCG/5ML IJ SOLN
INTRAMUSCULAR | Status: AC
  Filled 2019-06-11: qty 5

## 2019-06-11 MED ORDER — DEXAMETHASONE SODIUM PHOSPHATE 4 MG/ML IJ SOLN
INTRAMUSCULAR | Status: DC | PRN
  Administered 2019-06-11: 4 mg via INTRAVENOUS

## 2019-06-11 MED ORDER — DEXAMETHASONE SODIUM PHOSPHATE 10 MG/ML IJ SOLN
INTRAMUSCULAR | Status: AC
  Filled 2019-06-11: qty 1

## 2019-06-11 MED ORDER — LIDOCAINE HCL (CARDIAC) PF 100 MG/5ML IV SOSY
PREFILLED_SYRINGE | INTRAVENOUS | Status: DC | PRN
  Administered 2019-06-11: 60 mg via INTRAVENOUS

## 2019-06-11 MED ORDER — ONDANSETRON HCL 4 MG/2ML IJ SOLN
INTRAMUSCULAR | Status: DC | PRN
  Administered 2019-06-11: 4 mg via INTRAVENOUS

## 2019-06-11 MED ORDER — PROPOFOL 10 MG/ML IV BOLUS
INTRAVENOUS | Status: AC
  Filled 2019-06-11: qty 20

## 2019-06-11 MED ORDER — MIDAZOLAM HCL 2 MG/2ML IJ SOLN
INTRAMUSCULAR | Status: AC
  Filled 2019-06-11: qty 2

## 2019-06-11 MED ORDER — ONDANSETRON HCL 4 MG/2ML IJ SOLN
INTRAMUSCULAR | Status: AC
  Filled 2019-06-11: qty 2

## 2019-06-11 MED ORDER — EPHEDRINE 5 MG/ML INJ
INTRAVENOUS | Status: AC
  Filled 2019-06-11: qty 10

## 2019-06-11 MED ORDER — LIDOCAINE 2% (20 MG/ML) 5 ML SYRINGE
INTRAMUSCULAR | Status: AC
  Filled 2019-06-11: qty 5

## 2019-06-11 MED ORDER — BUPIVACAINE-EPINEPHRINE (PF) 0.25% -1:200000 IJ SOLN
INTRAMUSCULAR | Status: AC
  Filled 2019-06-11: qty 30

## 2019-06-11 MED ORDER — 0.9 % SODIUM CHLORIDE (POUR BTL) OPTIME
TOPICAL | Status: DC | PRN
  Administered 2019-06-11: 1000 mL

## 2019-06-11 MED ORDER — CHLORHEXIDINE GLUCONATE CLOTH 2 % EX PADS: 6.0000 | MEDICATED_PAD | Freq: Once | CUTANEOUS | Status: DC

## 2019-06-11 MED ORDER — CEFAZOLIN SODIUM-DEXTROSE 2-4 GM/100ML-% IV SOLN
2.0000 g | INTRAVENOUS | Status: AC
  Administered 2019-06-11: 2 g via INTRAVENOUS
  Filled 2019-06-11: qty 100

## 2019-06-11 MED ORDER — ACETAMINOPHEN 500 MG PO TABS
1000.0000 mg | ORAL_TABLET | ORAL | Status: AC
  Administered 2019-06-11: 1000 mg via ORAL
  Filled 2019-06-11: qty 2

## 2019-06-11 MED ORDER — LACTATED RINGERS IV SOLN
INTRAVENOUS | Status: DC
  Administered 2019-06-11: 07:00:00 via INTRAVENOUS

## 2019-06-11 MED ORDER — CELECOXIB 200 MG PO CAPS
200.0000 mg | ORAL_CAPSULE | ORAL | Status: AC
  Administered 2019-06-11: 200 mg via ORAL
  Filled 2019-06-11: qty 1

## 2019-06-11 MED ORDER — FENTANYL CITRATE (PF) 100 MCG/2ML IJ SOLN: 25.0000 ug | INTRAMUSCULAR | Status: DC | PRN

## 2019-06-11 MED ORDER — GABAPENTIN 300 MG PO CAPS
300.0000 mg | ORAL_CAPSULE | ORAL | Status: AC
  Administered 2019-06-11: 300 mg via ORAL
  Filled 2019-06-11: qty 1

## 2019-06-11 MED ORDER — BUPIVACAINE-EPINEPHRINE 0.25% -1:200000 IJ SOLN
INTRAMUSCULAR | Status: DC | PRN
  Administered 2019-06-11: 20 mL

## 2019-06-11 MED ORDER — HYDROCODONE-ACETAMINOPHEN 5-325 MG PO TABS: 1.0000 | ORAL_TABLET | Freq: Four times a day (QID) | ORAL | 0 refills | Status: DC | PRN

## 2019-06-11 MED ORDER — FENTANYL CITRATE (PF) 100 MCG/2ML IJ SOLN
INTRAMUSCULAR | Status: DC | PRN
  Administered 2019-06-11: 50 ug via INTRAVENOUS

## 2019-06-11 SURGICAL SUPPLY — 32 items
APPLIER CLIP 9.375 MED OPEN (MISCELLANEOUS) ×3
BINDER BREAST LRG (GAUZE/BANDAGES/DRESSINGS) IMPLANT
BINDER BREAST XLRG (GAUZE/BANDAGES/DRESSINGS) IMPLANT
CANISTER SUCT 3000ML PPV (MISCELLANEOUS) ×3 IMPLANT
CHLORAPREP W/TINT 26 (MISCELLANEOUS) ×3 IMPLANT
CLIP APPLIE 9.375 MED OPEN (MISCELLANEOUS) IMPLANT
COVER PROBE W GEL 5X96 (DRAPES) ×3 IMPLANT
COVER SURGICAL LIGHT HANDLE (MISCELLANEOUS) ×3 IMPLANT
COVER WAND RF STERILE (DRAPES) IMPLANT
DERMABOND ADVANCED (GAUZE/BANDAGES/DRESSINGS) ×2
DERMABOND ADVANCED .7 DNX12 (GAUZE/BANDAGES/DRESSINGS) ×1 IMPLANT
DEVICE DUBIN SPECIMEN MAMMOGRA (MISCELLANEOUS) ×3 IMPLANT
DRAPE CHEST BREAST 15X10 FENES (DRAPES) ×3 IMPLANT
ELECT COATED BLADE 2.86 ST (ELECTRODE) ×3 IMPLANT
ELECT REM PT RETURN 9FT ADLT (ELECTROSURGICAL) ×3
ELECTRODE REM PT RTRN 9FT ADLT (ELECTROSURGICAL) ×1 IMPLANT
GLOVE BIO SURGEON STRL SZ7.5 (GLOVE) ×6 IMPLANT
GOWN STRL REUS W/ TWL LRG LVL3 (GOWN DISPOSABLE) ×2 IMPLANT
GOWN STRL REUS W/TWL LRG LVL3 (GOWN DISPOSABLE) ×4
KIT BASIN OR (CUSTOM PROCEDURE TRAY) ×3 IMPLANT
KIT MARKER MARGIN INK (KITS) ×3 IMPLANT
LIGHT WAVEGUIDE WIDE FLAT (MISCELLANEOUS) IMPLANT
NDL HYPO 25GX1X1/2 BEV (NEEDLE) ×1 IMPLANT
NEEDLE HYPO 25GX1X1/2 BEV (NEEDLE) ×3 IMPLANT
NS IRRIG 1000ML POUR BTL (IV SOLUTION) ×3 IMPLANT
PACK GENERAL/GYN (CUSTOM PROCEDURE TRAY) ×3 IMPLANT
SUT MNCRL AB 4-0 PS2 18 (SUTURE) ×3 IMPLANT
SUT SILK 2 0 SH (SUTURE) IMPLANT
SUT VIC AB 3-0 SH 18 (SUTURE) ×3 IMPLANT
SYR CONTROL 10ML LL (SYRINGE) ×3 IMPLANT
TOWEL GREEN STERILE (TOWEL DISPOSABLE) ×3 IMPLANT
TOWEL GREEN STERILE FF (TOWEL DISPOSABLE) ×3 IMPLANT

## 2019-06-11 NOTE — Anesthesia Postprocedure Evaluation (Signed)
Anesthesia Post Note  Patient: [name]  Procedure(s) Performed: LEFT BREAST LUMPECTOMY WITH RADIOACTIVE SEED LOCALIZATION (Left Breast)     Patient location during evaluation: PACU Anesthesia Type: General Level of consciousness: awake and alert Pain management: pain level controlled Vital Signs Assessment: post-procedure vital signs reviewed and stable Respiratory status: spontaneous breathing, nonlabored ventilation, respiratory function stable and patient connected to nasal cannula oxygen Cardiovascular status: blood pressure returned to baseline and stable Postop Assessment: no apparent nausea or vomiting Anesthetic complications: no    Last Vitals:  Vitals:   06/11/19 0944 06/11/19 0959  BP: (!) 172/65 (!) 169/60  Pulse: 60 (!) 53  Resp: 12 14  Temp: (!) 36.1 C (!) 36.3 C  SpO2: 99% 96%    Last Pain:  Vitals:   06/11/19 0959  TempSrc:   PainSc: 0-No pain                 Tiajuana Amass

## 2019-06-11 NOTE — Interval H&P Note (Signed)
History and Physical Interval Note:  06/11/2019 8:18 AM  [name]  has presented today for surgery, with the diagnosis of LEFT BREAST CANCER.  The various methods of treatment have been discussed with the patient and family. After consideration of risks, benefits and other options for treatment, the patient has consented to  Procedure(s): LEFT BREAST LUMPECTOMY WITH RADIOACTIVE SEED LOCALIZATION (Left) as a surgical intervention.  The patient's history has been reviewed, patient examined, no change in status, stable for surgery.  I have reviewed the patient's chart and labs.  Questions were answered to the patient's satisfaction.     Autumn Messing III

## 2019-06-11 NOTE — Op Note (Signed)
06/11/2019  9:32 AM  PATIENT:  [name]  85 y.o. female  PRE-OPERATIVE DIAGNOSIS:  LEFT BREAST CANCER  POST-OPERATIVE DIAGNOSIS:  LEFT BREAST CANCER  PROCEDURE:  Procedure(s): LEFT BREAST LUMPECTOMY WITH RADIOACTIVE SEED LOCALIZATION (Left)  SURGEON:  Surgeon(s) and Role:    * Jovita Kussmaul, MD - Primary  PHYSICIAN ASSISTANT:   ASSISTANTS: none   ANESTHESIA:   local and general  EBL:  minimal   BLOOD ADMINISTERED:none  DRAINS: none   LOCAL MEDICATIONS USED:  MARCAINE     SPECIMEN:  Source of Specimen:  left breast tissue with additional medial, lateral, superior, inferior, and deep margins  DISPOSITION OF SPECIMEN:  PATHOLOGY  COUNTS:  YES  TOURNIQUET:  * No tourniquets in log *  DICTATION: .Dragon Dictation   After informed consent was obtained the patient was brought to the operating room and placed in the supine position on the operating table.  After adequate induction of general anesthesia the patient's left breast was prepped with ChloraPrep, allowed to dry, and draped in usual sterile manner.  An appropriate timeout was performed.  Previously an I-125 seed was placed in the upper outer aspect of the left breast to mark an area of invasive breast cancer.  The neoprobe was set to I-125 in the area of radioactivity was readily identified.  The area around this was infiltrated with quarter percent Marcaine.  A curvilinear incision was made along the upper edge of the areola with a 15 blade knife.  The incision was carried through the skin and subcutaneous tissue sharply with the electrocautery.  Dissection was then carried out between the breast tissue and the subcutaneous fat and skin into the area of the upper breast where the cancer was.  Once this dissection was well beyond the area of the cancer then a circular portion of breast tissue was excised sharply around the radioactive seed while checking the area of radioactivity frequently.  Once the specimen was  removed it was oriented with the appropriate paint colors.  A specimen radiograph was obtained that showed the clip and seed to be within the specimen.  I did decide to take some additional margins.  I took tissue from the medial, lateral, superior, inferior, and deep margins and marked these appropriately.  The anterior margin was the subcutaneous fat and skin.  This was all sent to pathology for further evaluation.  Hemostasis was achieved using the Bovie electrocautery.  The wound was irrigated with saline and infiltrated with more quarter percent Marcaine.  The deep layer of the wound was then closed with layers of interrupted 3-0 Vicryl stitches.  The skin was closed with interrupted 4-0 Monocryl subcuticular stitches.  Dermabond dressings were applied.  The patient tolerated the procedure well.  At the end of the case all needle sponge and instrument counts were correct.  The patient was then awakened and taken to recovery in stable condition.  PLAN OF CARE: Discharge to home after PACU  PATIENT DISPOSITION:  PACU - hemodynamically stable.   Delay start of Pharmacological VTE agent (>24hrs) due to surgical blood loss or risk of bleeding: not applicable

## 2019-06-11 NOTE — Transfer of Care (Signed)
Immediate Anesthesia Transfer of Care Note  Patient: AILANNY BEAR  Procedure(s) Performed: LEFT BREAST LUMPECTOMY WITH RADIOACTIVE SEED LOCALIZATION (Left Breast)  Patient Location: PACU  Anesthesia Type:General  Level of Consciousness: oriented, drowsy and patient cooperative  Airway & Oxygen Therapy: Patient Spontanous Breathing and Patient connected to nasal cannula oxygen  Post-op Assessment: Report given to RN and Post -op Vital signs reviewed and stable  Post vital signs: Reviewed  Last Vitals:  Vitals Value Taken Time  BP 172/63 06/11/19 0944  Temp    Pulse 55 06/11/19 0945  Resp 11 06/11/19 0944  SpO2 100 % 06/11/19 0945  Vitals shown include unvalidated device data.  Last Pain:  Vitals:   06/11/19 0653  TempSrc:   PainSc: 0-No pain         Complications: No apparent anesthesia complications

## 2019-06-11 NOTE — H&P (Signed)
[name]  Location: [facility] Patient #: [id number] DOB: 14-Dec-1933 Married / Language: English / Race: White Female   History of Present Illness  The patient is a 85 year old female who presents with breast cancer. We are asked to see the patient in consultation by Dr. Johnnette Gourd to evaluate her for a new left breast cancer. The patient is a 85 year old white female who recently went for a routine screening mammogram. At that time she was found to have a 1.3 cm mass in the upper portion of the left breast with normal-looking lymph nodes. The mass was biopsied and came back as an invasive breast cancer. All of her tumor markers are pending. She does have a history of right breast cancer treated with lumpectomy and radiation in 2010. She also has a history of ovarian cancer. She does not smoke.   Allergies No Known Drug Allergies   Medication History amLODIPine Besylate (10MG  Tablet, Oral) Active. Carvedilol (25MG  Tablet, Oral) Active. cloNIDine HCl (0.1MG  Tablet, Oral) Active. Valsartan (320MG  Tablet, Oral) Active. Aspirin (81MG  Tablet, Oral) Active. Multi-Vitamin (Oral) Active. Calcium (Oral) Specific strength unknown - Active. Medications Reconciled    Review of Systems  General Not Present- Appetite Loss, Chills, Fatigue, Fever, Night Sweats, Weight Gain and Weight Loss. Note: All other systems negative (unless as noted in HPI & included Review of Systems) Skin Not Present- Change in Wart/Mole, Dryness, Hives, Jaundice, New Lesions, Non-Healing Wounds, Rash and Ulcer. HEENT Not Present- Earache, Hearing Loss, Hoarseness, Nose Bleed, Oral Ulcers, Ringing in the Ears, Seasonal Allergies, Sinus Pain, Sore Throat, Visual Disturbances, Wears glasses/contact lenses and Yellow Eyes. Respiratory Not Present- Bloody sputum, Chronic Cough, Difficulty Breathing, Snoring and Wheezing. Breast Not Present- Breast Mass, Breast Pain, Nipple Discharge  and Skin Changes. Cardiovascular Not Present- Chest Pain, Difficulty Breathing Lying Down, Leg Cramps, Palpitations, Rapid Heart Rate, Shortness of Breath and Swelling of Extremities. Gastrointestinal Not Present- Abdominal Pain, Bloating, Bloody Stool, Change in Bowel Habits, Chronic diarrhea, Constipation, Difficulty Swallowing, Excessive gas, Gets full quickly at meals, Hemorrhoids, Indigestion, Nausea, Rectal Pain and Vomiting. Female Genitourinary Not Present- Frequency, Nocturia, Painful Urination, Pelvic Pain and Urgency. Musculoskeletal Not Present- Back Pain, Joint Pain, Joint Stiffness, Muscle Pain, Muscle Weakness and Swelling of Extremities. Neurological Not Present- Decreased Memory, Fainting, Headaches, Numbness, Seizures, Tingling, Tremor, Trouble walking and Weakness. Psychiatric Not Present- Anxiety, Bipolar, Change in Sleep Pattern, Depression, Fearful and Frequent crying. Endocrine Not Present- Cold Intolerance, Excessive Hunger, Hair Changes, Heat Intolerance, Hot flashes and New Diabetes. Hematology Not Present- Easy Bruising, Excessive bleeding, Gland problems, HIV and Persistent Infections.  Vitals Weight: 134.8 lb Height: 67in Body Surface Area: 1.71 m Body Mass Index: 21.11 kg/m  Temp.: 98.77F  Pulse: 72 (Regular)  BP: 122/84(Sitting, Left Arm, Standard)       Physical Exam General Mental Status-Alert. General Appearance-Consistent with stated age. Hydration-Well hydrated. Voice-Normal.  Head and Neck Head-normocephalic, atraumatic with no lesions or palpable masses. Trachea-midline. Thyroid Gland Characteristics - normal size and consistency.  Eye Eyeball - Bilateral-Extraocular movements intact. Sclera/Conjunctiva - Bilateral-No scleral icterus.  Chest and Lung Exam Chest and lung exam reveals -quiet, even and easy respiratory effort with no use of accessory muscles and on auscultation, normal breath sounds, no  adventitious sounds and normal vocal resonance. Inspection Chest Wall - Normal. Back - normal.  Breast Note: There are multiple well-healed scars on both breasts. There is no palpable mass in either breast other than a small bruise in the upper portion  of the left breast. There is no palpable axillary, supraclavicular, or cervical lymphadenopathy.   Cardiovascular Cardiovascular examination reveals -normal heart sounds, regular rate and rhythm with no murmurs and normal pedal pulses bilaterally. Note: There is a slight systolic murmur on auscultation   Abdomen Inspection Inspection of the abdomen reveals - No Hernias. Skin - Scar - no surgical scars. Palpation/Percussion Palpation and Percussion of the abdomen reveal - Soft, Non Tender, No Rebound tenderness, No Rigidity (guarding) and No hepatosplenomegaly. Auscultation Auscultation of the abdomen reveals - Bowel sounds normal.  Neurologic Neurologic evaluation reveals -alert and oriented x 3 with no impairment of recent or remote memory. Mental Status-Normal.  Musculoskeletal Normal Exam - Left-Upper Extremity Strength Normal and Lower Extremity Strength Normal. Normal Exam - Right-Upper Extremity Strength Normal and Lower Extremity Strength Normal.  Lymphatic Head & Neck  General Head & Neck Lymphatics: Bilateral - Description - Normal. Axillary  General Axillary Region: Bilateral - Description - Normal. Tenderness - Non Tender. Femoral & Inguinal  Generalized Femoral & Inguinal Lymphatics: Bilateral - Description - Normal. Tenderness - Non Tender.    Assessment & Plan   MALIGNANT NEOPLASM OF UPPER-OUTER QUADRANT OF LEFT FEMALE BREAST, UNSPECIFIED ESTROGEN RECEPTOR STATUS (C50.412) Impression: The patient appears to have a small early stage cancer in the upper portion of the left breast. I have discussed with her in detail the different options for treatment and at this point she favors breast conservation.  I feel like this is a very reasonable way of treating her cancer. Unfortunately all of her tumor markers are still pending. I will go ahead and refer her to medical and radiation oncology to discuss adjuvant therapy. As long as her tumor markers are favorable I do not feel as though she will need a node evaluation. I will plan for a left breast radioactive seed localized lumpectomy. I have discussed with her in detail the risks and benefits of the operation as well as some of the technical aspects and she understands and wishes to proceed  Current Plans Referred to Oncology, for evaluation and follow up (Oncology). Routine. Pt Education - Breast Cancer: discussed with patient and provided information.

## 2019-06-11 NOTE — Anesthesia Procedure Notes (Signed)
Procedure Name: LMA Insertion Date/Time: 06/11/2019 8:43 AM Performed by: Jenne Campus, CRNA Pre-anesthesia Checklist: Patient identified, Emergency Drugs available, Suction available and Patient being monitored Patient Re-evaluated:Patient Re-evaluated prior to induction Oxygen Delivery Method: Circle System Utilized Preoxygenation: Pre-oxygenation with 100% oxygen Induction Type: IV induction Ventilation: Mask ventilation without difficulty LMA: LMA inserted LMA Size: 4.0 Number of attempts: 1 Airway Equipment and Method: Bite block Placement Confirmation: positive ETCO2 and breath sounds checked- equal and bilateral Tube secured with: Tape Dental Injury: Teeth and Oropharynx as per pre-operative assessment

## 2019-06-12 ENCOUNTER — Encounter (HOSPITAL_COMMUNITY): Payer: Self-pay | Admitting: General Surgery

## 2019-06-15 DIAGNOSIS — E538 Deficiency of other specified B group vitamins: Secondary | ICD-10-CM | POA: Diagnosis not present

## 2019-06-15 DIAGNOSIS — Z23 Encounter for immunization: Secondary | ICD-10-CM | POA: Diagnosis not present

## 2019-06-21 ENCOUNTER — Inpatient Hospital Stay: Payer: Medicare Other | Attending: Oncology | Admitting: Oncology

## 2019-06-21 ENCOUNTER — Other Ambulatory Visit: Payer: Self-pay

## 2019-06-21 ENCOUNTER — Telehealth: Payer: Self-pay | Admitting: Oncology

## 2019-06-21 VITALS — BP 181/75 | HR 63 | Temp 98.3°F | Resp 17 | Ht 67.0 in | Wt 133.5 lb

## 2019-06-21 DIAGNOSIS — I10 Essential (primary) hypertension: Secondary | ICD-10-CM | POA: Diagnosis not present

## 2019-06-21 DIAGNOSIS — I2510 Atherosclerotic heart disease of native coronary artery without angina pectoris: Secondary | ICD-10-CM

## 2019-06-21 DIAGNOSIS — Z803 Family history of malignant neoplasm of breast: Secondary | ICD-10-CM | POA: Diagnosis not present

## 2019-06-21 DIAGNOSIS — C779 Secondary and unspecified malignant neoplasm of lymph node, unspecified: Secondary | ICD-10-CM | POA: Diagnosis not present

## 2019-06-21 DIAGNOSIS — Z170 Estrogen receptor positive status [ER+]: Secondary | ICD-10-CM

## 2019-06-21 DIAGNOSIS — Z8543 Personal history of malignant neoplasm of ovary: Secondary | ICD-10-CM | POA: Diagnosis not present

## 2019-06-21 DIAGNOSIS — C50412 Malignant neoplasm of upper-outer quadrant of left female breast: Secondary | ICD-10-CM | POA: Diagnosis not present

## 2019-06-21 MED ORDER — ANASTROZOLE 1 MG PO TABS: 1.0000 mg | ORAL_TABLET | Freq: Every day | ORAL | 3 refills | Status: DC

## 2019-06-21 NOTE — Progress Notes (Signed)
[facility] Patient Consult   Requesting MD: Myria Steenbergen 85 y.o.  01/10/1934    Reason for Consult: Breast cancer   HPI: Ms.Dyal was diagnosed with right-sided breast cancer in 2010.  She was treated with a lumpectomy, right breast radiation, and adjuvant anastrozole completed in 2015.  She reports feeling well.  A mammogram at the [facility] breast center revealed an abnormal lesion in the upper left breast. She underwent a biopsy at the [facility] breast center on 04/28/2019 ([id number]) pathology revealed a fibroadenoma and no evidence of malignancy. A repeat biopsy on 04/30/2019 ([id number]) revealed invasive mammary carcinoma, in situ carcinoma, and the invasive carcinoma.  Grade 2-3.  The tumor returned estrogen (100%) and progesterone (80%) receptor positive and the Ki-67 proliferation marker returned at 40%.  The tumor is HER-2 negative.  She was referred to Dr. Marlou Starks and underwent a left breast lumpectomy with radioactive seed localization for 2020. An excisional biopsy from the upper left breast was performed.  A radiograph confirmed the clip and seed within the specimen.  Additional margins were taken.  The pathology [id number]) revealed a 1.3 cm grade 3 invasive ductal carcinoma with associated intermediate grade DCIS.  Lymphovascular invasion is present.  Additional superior and inferior margins returned negative.  DCIS and invasive carcinoma is within 2 mm from the medial margin.  A focus of DCIS is 2 mm from the new posterior margin.  She is referred to consider adjuvant treatment options. Past Medical History:  Diagnosis Date  . Arthritis    HANDS AND BACK  . 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 Breast cancer ([place]) 2010   RIGHT  . CAD (coronary artery disease)   . Dysuria   . GERD (gastroesophageal reflux disease)    HX OF  . Heart murmur   . History of blood transfusion   . History of breast cancer ONCOLOGIST-- DR  Benay Spice   DX 2010--  RIGHT BREAST DCIS  HIGH GRADE S/P LUMPECTOMY W/ SLN BX--  NO RECURRENCE, 0.26 mm invasive carcinoma, ER positive, PR positive, and HER-2 positive, status post adjuvant radiation and 5 years of Arimidex  . History of kidney stones   . 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 History of skin cancer    excision basal cell  . Hyperlipidemia   . Hypertension   . Kidney stones 7/14  . Nocturia   . 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 Renal stones 2014  . Shingles 30 YRS AGO  . Ureteral calculi    bilateral    .  Renal insufficiency  Past Surgical History:  Procedure Laterality Date  . APPENDECTOMY    . BREAST LUMPECTOMY Right 04-13-2009   W/ SLN BX  . BREAST LUMPECTOMY WITH RADIOACTIVE SEED LOCALIZATION Left 06/11/2019   Procedure: LEFT BREAST LUMPECTOMY WITH RADIOACTIVE SEED LOCALIZATION;  Surgeon: Jovita Kussmaul, MD;  Location: [facility];  Service: General;  Laterality: Left;  . CARDIAC CATHETERIZATION  11-11-2005 DR Daneen Schick   MODERATE LAD DISEASE/ NORMAL LVF/ EF 65-75%  . CATARACT EXTRACTION W/ INTRAOCULAR LENS IMPLANT Bilateral 04/26/2013  . CHOLECYSTECTOMY  1990   OPEN  . COLONOSCOPY    . CYSTOSCOPY W/ URETERAL STENT PLACEMENT Bilateral 05/07/2013   Procedure: CYSTOSCOPY WITH STENT REPLACEMENTS;  Surgeon: Alexis Frock, MD;  Location: [facility];  Service: Urology;  Laterality: Bilateral;  . CYSTOSCOPY WITH BIOPSY N/A 04/07/2013   Procedure: CYSTOSCOPY WITH BIOPSY and fulgeration;  Surgeon: Alexis Frock, MD;  Location: [facility];  Service: Urology;  Laterality: N/A;  . CYSTOSCOPY WITH RETROGRADE PYELOGRAM, URETEROSCOPY AND STENT PLACEMENT Bilateral 05/07/2013   Procedure: CYSTOSCOPY WITH RETROGRADE PYELOGRAM, URETEROSCOPY ;  Surgeon:  Alexis Frock, MD;  Location: [facility];  Service: Urology;  Laterality: Bilateral;  . CYSTOSCOPY WITH RETROGRADE PYELOGRAM, URETEROSCOPY AND STENT PLACEMENT Bilateral 01/06/2019   Procedure: CYSTOSCOPY WITH RETROGRADE PYELOGRAM, URETEROSCOPY AND STENT PLACEMENT, FIRST STAGE;  Surgeon: Alexis Frock, MD;  Location: [facility];  Service: Urology;  Laterality: Bilateral;  . CYSTOSCOPY WITH RETROGRADE PYELOGRAM, URETEROSCOPY AND STENT PLACEMENT Bilateral 01/27/2019   Procedure: CYSTOSCOPY WITH RETROGRADE PYELOGRAM, URETEROSCOPY AND STENT PLACEMENT;  Surgeon: Alexis Frock, MD;  Location: [facility];  Service: Urology;  Laterality: Bilateral;  75 MINS  . CYSTOSCOPY WITH STENT PLACEMENT Bilateral 04/07/2013   Procedure: CYSTOSCOPY WITH STENT PLACEMENT;  Surgeon: Alexis Frock, MD;  Location: [facility];  Service: Urology;  Laterality: Bilateral;  . DX LAPAROSCOPY W/ PERITONEAL AND OMENTAL BX'S AND WASHINGS  02-16-2008  . EXCISION RIGHT BREAST MASS  02-10-2002  . EXP. LAP. EXTENSIVE ADHESIOLYSIS/ RESECTION TERMINAL ILEUM , ASCENDING AND DESCENDING COLON WITH CREATION ILEOSTOMY AND MUCOUS FISTULA  02-19-2008   PERFERATION AND ABD. CANCER--  TAKEDOWN ILEOSTOMY 08-23-2008  . HOLMIUM LASER APPLICATION Bilateral 06/13/8477   Procedure: HOLMIUM LASER APPLICATION;  Surgeon: Alexis Frock, MD;  Location: [facility];  Service: Urology;  Laterality: Bilateral;  . HOLMIUM LASER APPLICATION Bilateral 01/17/8207   Procedure: HOLMIUM LASER APPLICATION;  Surgeon: Alexis Frock, MD;  Location: [facility];  Service: Urology;  Laterality: Bilateral;  . ILEOSTOMY CLOSURE  08/2008  . TOTAL ABDOMINAL HYSTERECTOMY W/ BILATERAL SALPINGOOPHORECTOMY  1976  . TOTAL ABDOMINAL HYSTERECTOMY W/ BILATERAL SALPINGOOPHORECTOMY  1976  . VULVAR LESION REMOVAL N/A 08/22/2014   Procedure: EXCISION OF VULVAR CYST  ;  Surgeon: Lyman Speller, MD;  Location: [facility];  Service: Gynecology;  Laterality: N/A;  . WOUND DEBRIDEMENT  05/28/2012    Procedure: DEBRIDEMENT ABDOMINAL WOUND;  Surgeon: Haywood Lasso, MD;  Location: [facility];  Service: General;  Laterality: N/A;  excision chronic wound abdominal wall    Medications: Reviewed  Allergies: No Known Allergies  Family history: A half sister has a history of breast cancer.  Social History:   She lives with her husband in [place]..  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  She does not use cigarettes or alcohol.  She received red cell transfusions in 2009.  No risk factor for hepatitis.  ROS:   Positives include: Chronic diarrhea following bowel surgery in 2009  A complete ROS was otherwise negative.  Physical Exam:  Blood pressure (!) 181/75, pulse 63, temperature 98.3 F (36.8 C), temperature source Oral, resp. rate 17, height 5' 7" (1.702 m), weight 133 lb 8 oz (60.6 kg), last menstrual period 10/07/1974, SpO2 100 %.  HEENT: Neck without mass Lungs: Clear bilaterally Cardiac: Regular rate and rhythm Abdomen: No mass, nontender, no hepatosplenomegaly  Vascular: No leg edema Lymph nodes: No cervical, supraclavicular, axillary, or inguinal nodes Neurologic: Alert and oriented, the motor exam appears intact in the upper lower extremities bilaterally Skin: No rash Musculoskeletal: No spine tenderness Breast: No mass in either breast.  Healed left lumpectomy scar.  Additional biopsy scars in both breasts.  LAB:  CBC  Lab Results  Component Value Date   WBC 10.3 06/07/2019   HGB 14.2 06/07/2019   HCT 43.4 06/07/2019   MCV 96.4 06/07/2019   PLT 516 (H) 06/07/2019   NEUTROABS 4.5 04/11/2009        CMP  Lab Results  Component Value Date   NA 141 06/07/2019   K 4.3 06/07/2019   CL 110 06/07/2019   CO2 24 06/07/2019   GLUCOSE 128 (H) 06/07/2019   BUN 32 (H) 06/07/2019   CREATININE 1.30 (H) 06/07/2019   CALCIUM 9.5 06/07/2019   PROT 6.1 04/11/2009   ALBUMIN 3.3 (L) 04/11/2009   AST 22 04/11/2009   ALT 41 (H) 04/11/2009    ALKPHOS 101 04/11/2009   BILITOT 0.7 04/11/2009   GFRNONAA 38 (L) 06/07/2019   GFRAA 44 (L) 06/07/2019      Assessment/Plan:  1. 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 2. Remote hysterectomy and bilateral salpingo-oophorectomy with the cytology from [place] confirming numerous "psammoma bodies." 3. 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  a. Status post a needle localized lumpectomy and sentinel lymph node biopsy 04/13/2009 with the pathology confirming high-grade ductal carcinoma in situ with an associated 0.26 mm invasive carcinoma, ER positive, PR positive, and HER2 positive. b. Status post adjuvant right breast radiation completed 06/19/2009. c. Initiation of Arimidex 05/01/2009.  Completed 5 years in July 2015 4. Family history of breast cancer. 5. History of Clostridium difficile colitis. 6. Frequent bowel movements following the bowel resection in 2009. 7. Hypertension 8. Left breast cancer, grade 3 invasive ductal and intermediate grade DCIS, clinical stage Ia (T1cNx), ER positive, PR positive, HER-2 negative, status post a radioactive seed localized lumpectomy 06/11/2019, in situ and invasive carcinoma 2 mm from the medial margin, DCIS 2 mm from the posterior margin 9. Elevated platelet count 2020    Disposition:    Ms.Bacigalupo has been diagnosed with an invasive left-sided breast cancer and associated DCIS.  I discussed the pathology report and treatment options with her.  Her sister and husband were present by telephone for today's visit.  We discussed the expected benefit associated with adjuvant aromatase inhibitor therapy in this setting.  We reviewed potential  toxicities associated with aromatase inhibitors.  I recommend Arimidex for at least 5 years.  I discussed the case with Dr. Isidore Moos.  She will see Ms.Appleman to discuss the indication for adjuvant radiation.  The plan is to begin Arimidex at the completion of radiation.  She has a personal history of bilateral breast cancer and a gynecologic malignancy.  She agrees to referral to the genetics counselor for BRCA testing.  The platelet count was mildly elevated when checked several times earlier this year.  We will obtain a CBC when she returns in 4 months.  She will return for an office visit in 4 months.  I am available to see her sooner as needed.  Betsy Coder, MD  06/21/2019, 3:48 PM

## 2019-06-21 NOTE — Progress Notes (Signed)
Provided patient handout on arimidex 1mg . She was instructed to begin after radiation is completed.

## 2019-06-21 NOTE — Telephone Encounter (Signed)
Called and spoke with patient. Confirmed appt  °

## 2019-06-22 ENCOUNTER — Encounter: Payer: Self-pay | Admitting: *Deleted

## 2019-06-30 NOTE — Progress Notes (Signed)
Location of Breast Cancer: Left Breast  Histology per Pathology Report:  04/30/19 Diagnosis Breast, left, needle core biopsy, 12 o'clock, 3cmfn - INVASIVE MAMMARY CARCINOMA. - MAMMARY CARCINOMA IN SITU  Receptor Status: ER(100%), PR (80%), Her2-neu (NEG), Ki-(40%)  06/11/19 Diagnosis 1. Breast, lumpectomy, Left w/seed - INVASIVE DUCTAL CARCINOMA, GRADE 3, 1.3 CM - DUCTAL CARCINOMA IN SITU, INTERMEDIATE NUCLEAR GRADE - CARCINOMA BROADLY INVOLVES THE MEDIAL RESECTION EDGE AND IS 2 MM FROM THE POSTERIOR RESECTION EDGE AND 5 MM FROM THE LATERAL RESECTION EDGE - DCIS IS 1 MM FROM THE LATERAL, MEDIAL AND POSTERIOR RESECTION EDGES - LYMPHOVASCULAR INVASION IS PRESENT - BIOPSY SITE CHANGES - SEE ONCOLOGY TABLE 2. Breast, excision, Left additional superior margin - BREAST PARENCHYMA, NEGATIVE FOR CARCINOMA 3. Breast, excision, Left additional inferior margin - BREAST PARENCHYMA, NEGATIVE FOR CARCINOMA 4. Breast, excision, Left additional medial margin - INVASIVE DUCTAL CARCINOMA, GRADE 3 - DUCTAL CARCINOMA IN SITU - IN SITU AND INVASIVE CARCINOMA IS 2 MM FROM THE NEW MEDIAL MARGIN 5. Breast, excision, Left additional lateral margin - BREAST PARENCHYMA, NEGATIVE FOR CARCINOMA 6. Breast, excision, Left additional deep margin - FOCUS OF DUCTAL CARCINOMA IN SITU, INTERMEDIATE NUCLEAR GRADE - DCIS IS 2 MM FROM THE NEW POSTERIOR MARGIN  Did patient present with symptoms or was this found on screening mammography?: It was found on a screening mammogram.   Past/Anticipated interventions by surgeon, if any: 06/11/19 PROCEDURE:  Procedure(s): LEFT BREAST LUMPECTOMY WITH RADIOACTIVE SEED LOCALIZATION (Left) SURGEON:  Surgeon(s) and Role:    Jovita Kussmaul, MD - Primary   Past/Anticipated interventions by medical oncology, if any:  Dr. Benay Spice 06/21/19 Disposition:  [name] has been diagnosed with an invasive left-sided breast cancer and associated DCIS.  I discussed the pathology  report and treatment options with her.  Her sister and husband were present by telephone for today's visit.  We discussed the expected benefit associated with adjuvant aromatase inhibitor therapy in this setting.  We reviewed potential toxicities associated with aromatase inhibitors.  I recommend Arimidex for at least 5 years.  I discussed the case with Dr. Isidore Moos.  She will see [name] to discuss the indication for adjuvant radiation.  The plan is to begin Arimidex at the completion of radiation.  She has a personal history of bilateral breast cancer and a gynecologic malignancy.  She agrees to referral to the genetics counselor for BRCA testing.  The platelet count was mildly elevated when checked several times earlier this year.  We will obtain a CBC when she returns in 4 months.  She will return for an office visit in 4 months.  I am available to see her sooner as needed.   Lymphedema issues, if any:  She denies.   Pain issues, if any:  She denies.  SAFETY ISSUES:  Prior radiation? Yes, right breast 2010, at [facility].   Pacemaker/ICD? No  Possible current pregnancy? No  Is the patient on methotrexate? No  Current Complaints / other details:   1. 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  a. Status post a needle localized lumpectomy and sentinel lymph node biopsy 04/13/2009 with the pathology confirming high-grade ductal carcinoma in situ with an associated 0.26 mm invasive carcinoma, ER positive, PR positive, and HER2 positive. b. Status post adjuvant right breast radiation completed 06/19/2009. c. Initiation of Arimidex 05/01/2009.

## 2019-07-01 ENCOUNTER — Telehealth: Payer: Self-pay | Admitting: *Deleted

## 2019-07-01 DIAGNOSIS — Z170 Estrogen receptor positive status [ER+]: Secondary | ICD-10-CM

## 2019-07-01 DIAGNOSIS — C50412 Malignant neoplasm of upper-outer quadrant of left female breast: Secondary | ICD-10-CM

## 2019-07-01 NOTE — Telephone Encounter (Signed)
Notified patient that Dr. Benay Spice saw that her platelet count was elevated at last visit, but it has been elevated over the past year. This most likely benign, but he will check CBC again in January at her 4 month f/u.

## 2019-07-02 ENCOUNTER — Other Ambulatory Visit: Payer: Self-pay | Admitting: Licensed Clinical Social Worker

## 2019-07-02 ENCOUNTER — Other Ambulatory Visit: Payer: Medicare Other

## 2019-07-02 ENCOUNTER — Telehealth: Payer: Self-pay | Admitting: Licensed Clinical Social Worker

## 2019-07-02 DIAGNOSIS — Z853 Personal history of malignant neoplasm of breast: Secondary | ICD-10-CM

## 2019-07-02 DIAGNOSIS — C50412 Malignant neoplasm of upper-outer quadrant of left female breast: Secondary | ICD-10-CM

## 2019-07-02 DIAGNOSIS — Z8543 Personal history of malignant neoplasm of ovary: Secondary | ICD-10-CM

## 2019-07-02 NOTE — Telephone Encounter (Signed)
Called patient regarding upcoming Webex appointment, per patient's request this will be a walk-in visit. °

## 2019-07-05 ENCOUNTER — Other Ambulatory Visit: Payer: Self-pay

## 2019-07-05 ENCOUNTER — Encounter: Payer: Self-pay | Admitting: Licensed Clinical Social Worker

## 2019-07-05 ENCOUNTER — Inpatient Hospital Stay (HOSPITAL_BASED_OUTPATIENT_CLINIC_OR_DEPARTMENT_OTHER): Payer: Medicare Other | Admitting: Licensed Clinical Social Worker

## 2019-07-05 ENCOUNTER — Inpatient Hospital Stay: Payer: Medicare Other

## 2019-07-05 DIAGNOSIS — Z803 Family history of malignant neoplasm of breast: Secondary | ICD-10-CM | POA: Diagnosis not present

## 2019-07-05 DIAGNOSIS — Z853 Personal history of malignant neoplasm of breast: Secondary | ICD-10-CM

## 2019-07-05 DIAGNOSIS — C50412 Malignant neoplasm of upper-outer quadrant of left female breast: Secondary | ICD-10-CM

## 2019-07-05 DIAGNOSIS — Z170 Estrogen receptor positive status [ER+]: Secondary | ICD-10-CM

## 2019-07-05 DIAGNOSIS — Z8543 Personal history of malignant neoplasm of ovary: Secondary | ICD-10-CM

## 2019-07-05 NOTE — Progress Notes (Signed)
REFERRING PROVIDER: Ladell Pier, MD [address] [place],  [place] [address]  PRIMARY PROVIDER:  Leeroy Cha, MD  PRIMARY REASON FOR VISIT:  1. History of breast cancer   2. Family history of breast cancer   3. History of ovarian cancer   4. Carcinoma of upper-outer quadrant of left breast in female, estrogen receptor positive ([place])      HISTORY OF PRESENT ILLNESS:   [name], a 85 y.o. female, was seen for a [place] cancer genetics consultation at the request of Dr. Benay Spice due to a personal and family history of cancer.  [name] presents to clinic today to discuss the possibility of a hereditary predisposition to cancer, genetic testing, and to further clarify her future cancer risks, as well as potential cancer risks for family members.   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 She had a colon resection for this cancer.   In 2010, at the age of 85, [name] was diagnosed with DCIS of the right breast, treated with lumpectomy radiation and anastrozole.   In 2020, at the age of 43, [name] was diagnosed with IDC and DCIS of the left breast, ER/PR+, Her2-. This was treated with lumpectomy and radiation.   CANCER HISTORY:  Oncology History  Carcinoma of upper-outer quadrant of left breast in female, estrogen receptor positive ([place])  05/17/2019 Cancer Staging   Staging form: Breast, AJCC 8th Edition - Clinical stage from 05/17/2019: Stage IA (cT1c, cN0, cM0, G3, ER+, PR+, HER2-) - Signed by Eppie Gibson, MD on 05/19/2019   05/19/2019 Initial Diagnosis   Carcinoma of upper-outer quadrant of left breast in female, estrogen receptor positive ([place])      RISK FACTORS:  Menarche was at age 59.  First live birth at age no children, she has adopted daughter.  OCP use for approximately 0  years.  Ovaries intact: no.  Hysterectomy: yes.  Menopausal status: postmenopausal.  HRT use: 34 years. Colonoscopy: yes; normal. Mammogram within the last year: yes. Up to date with pelvic exams: yes. Any excessive radiation exposure in the past: Just with cancer treatment.   Past Medical History:  Diagnosis Date  . Arthritis    HANDS AND BACK  . 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 Breast cancer ([facility]) 2010   RIGHT  . CAD (coronary artery disease)   . Dysuria   . Family history of breast cancer   . GERD (gastroesophageal reflux disease)    HX OF  . Heart murmur   . History of blood transfusion   . History of breast cancer ONCOLOGIST-- DR Benay Spice   DX 2010--  RIGHT BREAST DCIS  HIGH GRADE S/P LUMPECTOMY W/ SLN BX--  NO RECURRENCE  . History of kidney stones   . 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 History of skin cancer    excision basal cell  . Hyperlipidemia   . Hypertension   . Kidney stones 7/14  . Nocturia   . Ovarian carcinoma ([place])    papillary serous-low grade, recurrence found in fat necrosis during ileostomy  . Renal stones 2014  . Shingles 30 YRS AGO  . Ureteral calculi    bilateral    Past  Surgical History:  Procedure Laterality Date  . APPENDECTOMY    . BREAST LUMPECTOMY Right 04-13-2009   W/ SLN BX  . BREAST LUMPECTOMY WITH RADIOACTIVE SEED LOCALIZATION Left 06/11/2019   Procedure: LEFT BREAST LUMPECTOMY WITH RADIOACTIVE SEED LOCALIZATION;  Surgeon: Jovita Kussmaul, MD;  Location: [facility];  Service: General;  Laterality: Left;  . CARDIAC CATHETERIZATION  11-11-2005 DR Daneen Schick   MODERATE LAD DISEASE/ NORMAL LVF/ EF 65-75%  . CATARACT EXTRACTION W/ INTRAOCULAR LENS IMPLANT Bilateral 04/26/2013  . CHOLECYSTECTOMY  1990   OPEN  . COLONOSCOPY    . CYSTOSCOPY W/ URETERAL STENT PLACEMENT  Bilateral 05/07/2013   Procedure: CYSTOSCOPY WITH STENT REPLACEMENTS;  Surgeon: Alexis Frock, MD;  Location: [facility];  Service: Urology;  Laterality: Bilateral;  . CYSTOSCOPY WITH BIOPSY N/A 04/07/2013   Procedure: CYSTOSCOPY WITH BIOPSY and fulgeration;  Surgeon: Alexis Frock, MD;  Location: [facility];  Service: Urology;  Laterality: N/A;  . CYSTOSCOPY WITH RETROGRADE PYELOGRAM, URETEROSCOPY AND STENT PLACEMENT Bilateral 05/07/2013   Procedure: CYSTOSCOPY WITH RETROGRADE PYELOGRAM, URETEROSCOPY ;  Surgeon: Alexis Frock, MD;  Location: [facility];  Service: Urology;  Laterality: Bilateral;  . CYSTOSCOPY WITH RETROGRADE PYELOGRAM, URETEROSCOPY AND STENT PLACEMENT Bilateral 01/06/2019   Procedure: CYSTOSCOPY WITH RETROGRADE PYELOGRAM, URETEROSCOPY AND STENT PLACEMENT, FIRST STAGE;  Surgeon: Alexis Frock, MD;  Location: [facility];  Service: Urology;  Laterality: Bilateral;  . CYSTOSCOPY WITH RETROGRADE PYELOGRAM, URETEROSCOPY AND STENT PLACEMENT Bilateral 01/27/2019   Procedure: CYSTOSCOPY WITH RETROGRADE PYELOGRAM, URETEROSCOPY AND STENT PLACEMENT;  Surgeon: Alexis Frock, MD;  Location: [facility];  Service: Urology;  Laterality: Bilateral;  75 MINS  . CYSTOSCOPY WITH STENT PLACEMENT Bilateral 04/07/2013   Procedure: CYSTOSCOPY WITH STENT PLACEMENT;  Surgeon: Alexis Frock, MD;  Location: [facility];  Service: Urology;  Laterality: Bilateral;  . DX LAPAROSCOPY W/ PERITONEAL AND OMENTAL BX'S AND WASHINGS  02-16-2008  . EXCISION RIGHT BREAST MASS  02-10-2002  . EXP. LAP. EXTENSIVE ADHESIOLYSIS/ RESECTION TERMINAL ILEUM , ASCENDING AND DESCENDING COLON WITH CREATION ILEOSTOMY AND MUCOUS FISTULA  02-19-2008   PERFERATION AND ABD. CANCER--  TAKEDOWN ILEOSTOMY 08-23-2008  . HOLMIUM LASER APPLICATION Bilateral 10/14/8414   Procedure: HOLMIUM LASER APPLICATION;  Surgeon: Alexis Frock, MD;  Location: [facility];  Service: Urology;  Laterality: Bilateral;  . HOLMIUM LASER APPLICATION  Bilateral 03/13/3015   Procedure: HOLMIUM LASER APPLICATION;  Surgeon: Alexis Frock, MD;  Location[facility];  Service: Urology;  Laterality: Bilateral;  . ILEOSTOMY CLOSURE  08/2008  . TOTAL ABDOMINAL HYSTERECTOMY W/ BILATERAL SALPINGOOPHORECTOMY  1976  . TOTAL ABDOMINAL HYSTERECTOMY W/ BILATERAL SALPINGOOPHORECTOMY  1976  . VULVAR LESION REMOVAL N/A 08/22/2014   Procedure: EXCISION OF VULVAR CYST  ;  Surgeon: Lyman Speller, MD;  Location: [facility];  Service: Gynecology;  Laterality: N/A;  . WOUND DEBRIDEMENT  05/28/2012   Procedure: DEBRIDEMENT ABDOMINAL WOUND;  Surgeon: Haywood Lasso, MD;  Location: [facility];  Service: General;  Laterality: N/A;  excision chronic wound abdominal wall    Social History   Socioeconomic History  . Marital status: Married    Spouse name: Not on file  . Number of children: Not on file  . Years of education: Not on file  . Highest education level: Not on file  Occupational History  . Not on file  Social Needs  . Financial resource strain: Not on file  . Food insecurity    Worry: Not on file    Inability: Not on file  .  Transportation needs    Medical: No    Non-medical: No  Tobacco Use  . Smoking status: Never Smoker  . Smokeless tobacco: Never Used  Substance and Sexual Activity  . Alcohol use: No  . Drug use: No  . Sexual activity: Never    Partners: Male    Birth control/protection: Surgical    Comment: Hysterectomy  Lifestyle  . Physical activity    Days per week: Not on file    Minutes per session: Not on file  . Stress: Not on file  Relationships  . Social Herbalist on phone: Not on file    Gets together: Not on file    Attends religious service: Not on file    Active member of club or organization: Not on file    Attends meetings of clubs or organizations: Not on file    Relationship status: Not on file  Other Topics Concern  . Not on file  Social History Narrative  . Not on file      FAMILY HISTORY:  We obtained a detailed, 4-generation family history.  Significant diagnoses are listed below:  [name] has one adopted daughter. She has 2 maternal half sisters, 2 maternal half brothers. One of her half sisters had breast cancer at 41 and is living at 66.   [name] mother died at 42, no history of cancer. Patient had 1 maternal aunt who died at 52, no history of cancer. Her aunt did have a daughter who was diagnosed with breast cancer in her 78s. Her maternal grandmother died at 61 and maternal grandfather died at 23.  [name] does not have any information about her father's side of the family.  [name] is unaware of previous family history of genetic testing for hereditary cancer risks. Patient's maternal ancestors are of English descent, and paternal ancestors are of unknown descent. There is no reported Ashkenazi Jewish ancestry. There is no known consanguinity.  GENETIC COUNSELING ASSESSMENT: [name] is a 85 y.o. female with a personal and family history which is somewhat suggestive of a hereditary cancer syndrome and predisposition to cancer. We, therefore, discussed and recommended the following at today's visit.   DISCUSSION: We discussed that 5 - 10% of breast is hereditary, with most cases associated with BRCA1/BRCA2 mutations.  There are other genes that can be associated with hereditary cancer syndromes. We discussed that testing is beneficial for several reasons including knowing how to follow individuals after completing their treatment, and understand if other family members could be at risk for cancer and allow them to undergo genetic testing.   We reviewed the characteristics, features and inheritance patterns of hereditary cancer syndromes. We also discussed genetic testing, including the appropriate family members to test, the process of testing, insurance coverage and turn-around-time for results. We discussed the implications of a  negative, positive and/or variant of uncertain significant result. We recommended [name] pursue genetic testing for the [facility] CancerNext-Expanded+RNAinsight gene panel.   The CancerNext-Expanded + RNAinsight gene panel offered by [employer] and includes sequencing and rearrangement analysis for the following 77 genes: IP, ALK, APC*, ATM*, AXIN2, BAP1, BARD1, BLM, BMPR1A, BRCA1*, BRCA2*, BRIP1*, CDC73, CDH1*,CDK4, CDKN1B, CDKN2A, CHEK2*, CTNNA1, DICER1, FANCC, FH, FLCN, GALNT12, KIF1B, LZTR1, MAX, MEN1, MET, MLH1*, MSH2*, MSH3, MSH6*, MUTYH*, NBN, NF1*, NF2, NTHL1, PALB2*, PHOX2B, PMS2*, POT1, PRKAR1A, PTCH1, PTEN*, RAD51C*, RAD51D*,RB1, RECQL, RET, SDHA, SDHAF2, SDHB, SDHC, SDHD, SMAD4, SMARCA4, SMARCB1, SMARCE1, STK11, SUFU, TMEM127, TP53*,TSC1, TSC2, VHL and XRCC2 (  sequencing and deletion/duplication); EGFR, EGLN1, HOXB13, KIT, MITF, PDGFRA, POLD1 and POLE (sequencing only); EPCAM and GREM1 (deletion/duplication only). DNA and RNA analyses performed for * genes.  Based on Ms. Menees personal and family history of cancer, she meets medical criteria for genetic testing. Despite that she meets criteria, she may still have an out of pocket cost. We discussed that if her out of pocket cost for testing is over $100, the laboratory will call and confirm whether she wants to proceed with testing.  If the out of pocket cost of testing is less than $100 she will be billed by the genetic testing laboratory.   PLAN: After considering the risks, benefits, and limitations, [name] provided informed consent to pursue genetic testing and the blood sample was sent to [facility] for analysis of the CancerNext-Expanded+RNAinsight panel. Results should be available within approximately 2-3 weeks' time, at which point they will be disclosed by telephone to [name], as will any additional recommendations warranted by these results. [name] will receive a summary of her genetic counseling visit and  a copy of her results once available. This information will also be available in Epic.   Lastly, we encouraged [name] to remain in contact with cancer genetics annually so that we can continuously update the family history and inform her of any changes in cancer genetics and testing that may be of benefit for this family.   [name] questions were answered to her satisfaction today. Our contact information was provided should additional questions or concerns arise. Thank you for the referral and allowing [place] to share in the care of your patient.   Faith Rogue, MS, [facility] Genetic Counselor [email or web address] Phone: [phone]  The patient was seen for a total of 30 minutes in face-to-face genetic counseling.  Drs. Magrinat, Lindi Adie and/or Burr Medico were available for discussion regarding this case.   _______________________________________________________________________ For Office Staff:  Number of people involved in session: 1 Was an Intern/ student involved with case: no

## 2019-07-06 ENCOUNTER — Ambulatory Visit
Admission: RE | Admit: 2019-07-06 | Discharge: 2019-07-06 | Disposition: A | Discharge status: Home / Self Care | Payer: Medicare Other | Source: Ambulatory Visit | Attending: Oncology | Admitting: Oncology

## 2019-07-06 ENCOUNTER — Other Ambulatory Visit: Payer: Self-pay

## 2019-07-06 ENCOUNTER — Ambulatory Visit
Admission: RE | Admit: 2019-07-06 | Discharge: 2019-07-06 | Disposition: A | Discharge status: Home / Self Care | Payer: Medicare Other | Source: Ambulatory Visit | Attending: Radiation Oncology | Admitting: Radiation Oncology

## 2019-07-06 ENCOUNTER — Encounter: Payer: Self-pay | Admitting: Radiation Oncology

## 2019-07-06 VITALS — BP 133/48 | HR 55 | Temp 97.8°F | Wt 131.6 lb

## 2019-07-06 DIAGNOSIS — Z79811 Long term (current) use of aromatase inhibitors: Secondary | ICD-10-CM | POA: Insufficient documentation

## 2019-07-06 DIAGNOSIS — Z9889 Other specified postprocedural states: Secondary | ICD-10-CM | POA: Diagnosis not present

## 2019-07-06 DIAGNOSIS — Z170 Estrogen receptor positive status [ER+]: Secondary | ICD-10-CM | POA: Diagnosis not present

## 2019-07-06 DIAGNOSIS — C50812 Malignant neoplasm of overlapping sites of left female breast: Secondary | ICD-10-CM

## 2019-07-06 DIAGNOSIS — Z79899 Other long term (current) drug therapy: Secondary | ICD-10-CM | POA: Insufficient documentation

## 2019-07-06 DIAGNOSIS — C50412 Malignant neoplasm of upper-outer quadrant of left female breast: Secondary | ICD-10-CM | POA: Insufficient documentation

## 2019-07-06 DIAGNOSIS — Z7982 Long term (current) use of aspirin: Secondary | ICD-10-CM | POA: Diagnosis not present

## 2019-07-06 DIAGNOSIS — Z510 Encounter for antineoplastic radiation therapy: Secondary | ICD-10-CM | POA: Diagnosis not present

## 2019-07-06 NOTE — Progress Notes (Signed)
Radiation Oncology         ([phone]) [phone] ________________________________  Name: [name] MRN: [id number]  Date: 07/06/2019  DOB: 02-08-1934  SIMULATION AND TREATMENT PLANNING NOTE  Special treatment procedure  outpatient  DIAGNOSIS:     ICD-10-CM   1. Carcinoma of upper-outer quadrant of left breast in female, estrogen receptor positive ([facility])  C50.412    Z17.0     NARRATIVE:  The patient was brought to the [facility].  Identity was confirmed.  All relevant records and images related to the planned course of therapy were reviewed.  The patient freely provided informed written consent to proceed with treatment after reviewing the details related to the planned course of therapy. The consent form was witnessed and verified by the simulation staff.    Then, the patient was set-up in a stable reproducible supine position for radiation therapy with her ipsilateral arm over her head, and her upper body secured in a custom-made Vac-lok device.  CT images were obtained.  Surface markings were placed.  The CT images were loaded into the planning software.    Special treatment procedure:  Special treatment procedure was performed today due to the extra time and effort required by myself to plan and prepare this patient for deep inspiration breath hold technique.  I have determined cardiac sparing to be of benefit to this patient to prevent long term cardiac damage due to radiation of the heart.  Bellows were placed on the patient's abdomen. To facilitate cardiac sparing, the patient was coached by the radiation therapists on breath hold techniques and breathing practice was performed. Practice waveforms were obtained. The patient was then scanned while maintaining breath hold in the treatment position.  This image was then transferred over to the imaging specialist. The imaging specialist then created a fusion of the free breathing and breath hold scans using the chest wall as  the stable structure. I personally reviewed the fusion in axial, coronal and sagittal image planes.  Excellent cardiac sparing was obtained.  I felt the patient is an appropriate candidate for breath hold and the patient will be treated as such.  The image fusion was then reviewed with the patient to reinforce the necessity of reproducible breath hold.   TREATMENT PLANNING NOTE: Treatment planning then occurred.  The radiation prescription was entered and confirmed.     A total of 3 medically necessary complex treatment devices were fabricated and supervised by me: 2 fields with MLCs for custom blocks to protect heart, and lungs;  and, a Vac-lok. MORE COMPLEX DEVICES MAY BE MADE IN DOSIMETRY FOR FIELD IN FIELD BEAMS FOR DOSE HOMOGENEITY.  I have requested : 3D Simulation which is medically necessary to give adequate dose to at risk tissues while sparing lungs and heart.  I have requested a DVH of the following structures: lungs, heart, left lumpectomy cavity.    The patient will receive 40.05 Gy in 15 fractions to the left breast and axilla with 2 high tangential fields.  This will be followed by a boost.  Optical Surface Tracking Plan:  Since intensity modulated radiotherapy (IMRT) and 3D conformal radiation treatment methods are predicated on accurate and precise positioning for treatment, intrafraction motion monitoring is medically necessary to ensure accurate and safe treatment delivery. The ability to quantify intrafraction motion without excessive ionizing radiation dose can only be performed with optical surface tracking. Accordingly, surface imaging offers the opportunity to obtain 3D measurements of patient position throughout IMRT and 3D  treatments without excessive radiation exposure. I am ordering optical surface tracking for this patient's upcoming course of radiotherapy.  ________________________________   Reference:  Ursula Alert, J, et al. Surface imaging-based  analysis of intrafraction motion for breast radiotherapy patients.Journal of [place], n. 6, nov. 2014. ISSN [id number].  Available at: <http://www.jacmp.org/index.php/jacmp/article/view/4957>.    -----------------------------------  Eppie Gibson, MD

## 2019-07-06 NOTE — Progress Notes (Signed)
Radiation Oncology         [phone]) [phone] ________________________________  Name: [name] MRN: [id number]  Date: 07/06/2019  DOB: [birth year]  Follow-Up Visit Note  Outpatient  CC: Leeroy Cha, MD  Ladell Pier, MD  Diagnosis:      ICD-10-CM   1. Cancer of overlapping sites of left breast ([place])  C50.812   2. Carcinoma of upper-outer quadrant of left breast in female, estrogen receptor positive ([place])  C50.412    Z17.0      Cancer Staging Carcinoma of upper-outer quadrant of left breast in female, estrogen receptor positive ([place]) Staging form: Breast, AJCC 8th Edition - Clinical stage from 05/17/2019: Stage IA (cT1c, cN0, cM0, G3, ER+, PR+, HER2-) - Signed by Eppie Gibson, MD on 05/19/2019 pT1cNx  CHIEF COMPLAINT: Here to discuss management of left breast cancer  Narrative:  The patient returns today for follow-up to discuss radiation treatment options. She was seen in consultation on 05/17/2019.    She opted to proceed with left lumpectomy on date of 06/11/2019 with pathology report revealing: tumor size of 1.3cm; histology of invasive ductal carcinoma;negative margin status to invasive disease of 2 mm and margin status to in situ disease of 2 mm; Grade 3. No nodes removed. Prognostic panel not repeated (ER+, PR+, Her2-).  She met with Dr. Benay Spice, whom she saw in 2010 for her right-sided breast cancer, on 06/21/2019. The plan is to begin arimidex following radiation treatment.  She underwent genetic testing on 07/05/2019. Results are pending.  Symptomatically, the patient reports: healing well.  She is caring for her husband who has Parkinson's         ALLERGIES:  has No Known Allergies.  Meds: Current Outpatient Medications  Medication Sig Dispense Refill  . amLODipine (NORVASC) 10 MG tablet Take 1 tablet (10 mg total) by mouth daily. 90 tablet 1  . aspirin EC 81 MG tablet Take 81 mg by mouth daily.    . BELSOMRA 10 MG TABS Take 10 mg by mouth at  bedtime as needed (sleep).     . Biotin 10000 MCG TABS Take 10,000 mcg by mouth daily.    . Calcium Carbonate-Vitamin D (CALTRATE 600+D PO) Take 1 tablet by mouth 2 (two) times daily.    . carvedilol (COREG) 25 MG tablet Take 1 tablet (25 mg total) by mouth 2 (two) times daily with a meal. Please keep 7/23 appointment for additional refills thanks. 180 tablet 0  . Cholecalciferol (VITAMIN D3) 50 MCG (2000 UT) TABS Take 2,000 Units by mouth daily with lunch.     . cloNIDine (CATAPRES) 0.1 MG tablet Take 1 tablet (0.1 mg total) by mouth 2 (two) times daily. Please keep upcoming appointment 7/23 for additional refills thanks. 180 tablet 0  . Multiple Vitamin (MULTIVITAMIN WITH MINERALS) TABS tablet Take 1 tablet by mouth daily.    Marland Kitchen olmesartan (BENICAR) 40 MG tablet Take 40 mg by mouth every evening.    Marland Kitchen PROCTOZONE-HC 2.5 % rectal cream PLACE 1 APPLICATION RECTALLY 2 TIMES A DAY AS NEEDED FOR HEMORRHOIDS. 60 g 11  . anastrozole (ARIMIDEX) 1 MG tablet Take 1 tablet (1 mg total) by mouth daily. (Patient not taking: Reported on 07/06/2019) 90 tablet 3   No current facility-administered medications for this encounter.     Physical Findings:  weight is 131 lb 9.6 oz (59.7 kg). Her oral temperature is 97.8 F (36.6 C). Her blood pressure is 133/48 (abnormal) and her pulse is 55 (abnormal). Her oxygen saturation  is 99%. .     General: Alert and oriented, in no acute distress  Psychiatric: Judgment and insight are intact. Affect is appropriate. Skin: no concerning lesions MSK: ambulatory Breast exam reveals healing well at central lumpectomy scar on left breast.  Prior lumpectomy scar noted on right breast  Lab Findings: Lab Results  Component Value Date   WBC 10.3 06/07/2019   HGB 14.2 06/07/2019   HCT 43.4 06/07/2019   MCV 96.4 06/07/2019   PLT 516 (H) 06/07/2019       Radiographic Findings: No results found.  Impression/Plan: Stage IA Left Breast Cancer  We discussed adjuvant  radiotherapy today.  I recommend 4 weeks  in order to prevent local regional recurrence in the left axilla /left breast.  The risks, benefits and side effects of this treatment were discussed in detail.  She understands that radiotherapy is associated with skin irritation and fatigue in the acute setting. Late effects can include cosmetic changes and rare injury to internal organs.  She is enthusiastic about proceeding with treatment. A consent form has been signed and placed in her chart.  We will proceed with CT simulation today.  I will treat her with high tangents to cover both the left breast and the axilla given that she did not undergo sentinel lymph node biopsy.   _____________________________________   Eppie Gibson, MD   This document serves as a record of services personally performed by Eppie Gibson, MD. It was created on her behalf by Wilburn Mylar, a trained medical scribe. The creation of this record is based on the scribe's personal observations and the provider's statements to them. This document has been checked and approved by the attending provider.

## 2019-07-07 DIAGNOSIS — Z170 Estrogen receptor positive status [ER+]: Secondary | ICD-10-CM | POA: Diagnosis not present

## 2019-07-07 DIAGNOSIS — Z510 Encounter for antineoplastic radiation therapy: Secondary | ICD-10-CM | POA: Diagnosis not present

## 2019-07-07 DIAGNOSIS — C50412 Malignant neoplasm of upper-outer quadrant of left female breast: Secondary | ICD-10-CM | POA: Diagnosis not present

## 2019-07-12 ENCOUNTER — Other Ambulatory Visit: Payer: Self-pay

## 2019-07-12 ENCOUNTER — Ambulatory Visit
Admission: RE | Admit: 2019-07-12 | Discharge: 2019-07-12 | Disposition: A | Discharge status: Home / Self Care | Payer: Medicare Other | Source: Ambulatory Visit | Attending: Radiation Oncology | Admitting: Radiation Oncology

## 2019-07-12 DIAGNOSIS — Z510 Encounter for antineoplastic radiation therapy: Secondary | ICD-10-CM | POA: Insufficient documentation

## 2019-07-12 DIAGNOSIS — C50412 Malignant neoplasm of upper-outer quadrant of left female breast: Secondary | ICD-10-CM | POA: Diagnosis not present

## 2019-07-12 DIAGNOSIS — Z170 Estrogen receptor positive status [ER+]: Secondary | ICD-10-CM | POA: Diagnosis not present

## 2019-07-12 DIAGNOSIS — C569 Malignant neoplasm of unspecified ovary: Secondary | ICD-10-CM

## 2019-07-12 MED ORDER — ALRA NON-METALLIC DEODORANT (RAD-ONC)
1.0000 "application " | Freq: Once | TOPICAL | Status: AC
  Administered 2019-07-12: 1 via TOPICAL

## 2019-07-12 MED ORDER — RADIAPLEXRX EX GEL
Freq: Two times a day (BID) | CUTANEOUS | Status: DC
  Administered 2019-07-12: 16:00:00 via TOPICAL

## 2019-07-13 ENCOUNTER — Other Ambulatory Visit: Payer: Self-pay

## 2019-07-13 ENCOUNTER — Ambulatory Visit
Admission: RE | Admit: 2019-07-13 | Discharge: 2019-07-13 | Disposition: A | Discharge status: Home / Self Care | Payer: Medicare Other | Source: Ambulatory Visit | Attending: Radiation Oncology | Admitting: Radiation Oncology

## 2019-07-13 DIAGNOSIS — C50412 Malignant neoplasm of upper-outer quadrant of left female breast: Secondary | ICD-10-CM | POA: Diagnosis not present

## 2019-07-13 DIAGNOSIS — Z170 Estrogen receptor positive status [ER+]: Secondary | ICD-10-CM | POA: Diagnosis not present

## 2019-07-13 DIAGNOSIS — Z510 Encounter for antineoplastic radiation therapy: Secondary | ICD-10-CM | POA: Diagnosis not present

## 2019-07-14 ENCOUNTER — Ambulatory Visit
Admission: RE | Admit: 2019-07-14 | Discharge: 2019-07-14 | Disposition: A | Discharge status: Home / Self Care | Payer: Medicare Other | Source: Ambulatory Visit | Attending: Radiation Oncology | Admitting: Radiation Oncology

## 2019-07-14 ENCOUNTER — Other Ambulatory Visit: Payer: Self-pay

## 2019-07-14 DIAGNOSIS — C50412 Malignant neoplasm of upper-outer quadrant of left female breast: Secondary | ICD-10-CM | POA: Diagnosis not present

## 2019-07-14 DIAGNOSIS — Z170 Estrogen receptor positive status [ER+]: Secondary | ICD-10-CM | POA: Diagnosis not present

## 2019-07-14 DIAGNOSIS — Z510 Encounter for antineoplastic radiation therapy: Secondary | ICD-10-CM | POA: Diagnosis not present

## 2019-07-15 ENCOUNTER — Other Ambulatory Visit: Payer: Self-pay

## 2019-07-15 ENCOUNTER — Ambulatory Visit
Admission: RE | Admit: 2019-07-15 | Discharge: 2019-07-15 | Disposition: A | Discharge status: Home / Self Care | Payer: Medicare Other | Source: Ambulatory Visit | Attending: Radiation Oncology | Admitting: Radiation Oncology

## 2019-07-15 DIAGNOSIS — Z510 Encounter for antineoplastic radiation therapy: Secondary | ICD-10-CM | POA: Diagnosis not present

## 2019-07-15 DIAGNOSIS — C50412 Malignant neoplasm of upper-outer quadrant of left female breast: Secondary | ICD-10-CM | POA: Diagnosis not present

## 2019-07-15 DIAGNOSIS — Z170 Estrogen receptor positive status [ER+]: Secondary | ICD-10-CM | POA: Diagnosis not present

## 2019-07-16 ENCOUNTER — Other Ambulatory Visit: Payer: Self-pay

## 2019-07-16 ENCOUNTER — Ambulatory Visit
Admission: RE | Admit: 2019-07-16 | Discharge: 2019-07-16 | Disposition: A | Discharge status: Home / Self Care | Payer: Medicare Other | Source: Ambulatory Visit | Attending: Radiation Oncology | Admitting: Radiation Oncology

## 2019-07-16 DIAGNOSIS — E538 Deficiency of other specified B group vitamins: Secondary | ICD-10-CM | POA: Diagnosis not present

## 2019-07-16 DIAGNOSIS — Z510 Encounter for antineoplastic radiation therapy: Secondary | ICD-10-CM | POA: Diagnosis not present

## 2019-07-16 DIAGNOSIS — C50412 Malignant neoplasm of upper-outer quadrant of left female breast: Secondary | ICD-10-CM | POA: Diagnosis not present

## 2019-07-16 DIAGNOSIS — Z170 Estrogen receptor positive status [ER+]: Secondary | ICD-10-CM | POA: Diagnosis not present

## 2019-07-19 ENCOUNTER — Other Ambulatory Visit: Payer: Self-pay

## 2019-07-19 ENCOUNTER — Ambulatory Visit
Admission: RE | Admit: 2019-07-19 | Discharge: 2019-07-19 | Disposition: A | Discharge status: Home / Self Care | Payer: Medicare Other | Source: Ambulatory Visit | Attending: Radiation Oncology | Admitting: Radiation Oncology

## 2019-07-19 DIAGNOSIS — C50412 Malignant neoplasm of upper-outer quadrant of left female breast: Secondary | ICD-10-CM | POA: Diagnosis not present

## 2019-07-19 DIAGNOSIS — Z510 Encounter for antineoplastic radiation therapy: Secondary | ICD-10-CM | POA: Diagnosis not present

## 2019-07-19 DIAGNOSIS — Z170 Estrogen receptor positive status [ER+]: Secondary | ICD-10-CM | POA: Diagnosis not present

## 2019-07-20 ENCOUNTER — Encounter: Payer: Self-pay | Admitting: Licensed Clinical Social Worker

## 2019-07-20 ENCOUNTER — Other Ambulatory Visit: Payer: Self-pay

## 2019-07-20 ENCOUNTER — Ambulatory Visit: Payer: Self-pay | Admitting: Licensed Clinical Social Worker

## 2019-07-20 ENCOUNTER — Telehealth: Payer: Self-pay | Admitting: Licensed Clinical Social Worker

## 2019-07-20 ENCOUNTER — Ambulatory Visit
Admission: RE | Admit: 2019-07-20 | Discharge: 2019-07-20 | Disposition: A | Discharge status: Home / Self Care | Payer: Medicare Other | Source: Ambulatory Visit | Attending: Radiation Oncology | Admitting: Radiation Oncology

## 2019-07-20 DIAGNOSIS — C50412 Malignant neoplasm of upper-outer quadrant of left female breast: Secondary | ICD-10-CM | POA: Diagnosis not present

## 2019-07-20 DIAGNOSIS — Z1501 Genetic susceptibility to malignant neoplasm of breast: Secondary | ICD-10-CM | POA: Insufficient documentation

## 2019-07-20 DIAGNOSIS — Z1589 Genetic susceptibility to other disease: Secondary | ICD-10-CM | POA: Insufficient documentation

## 2019-07-20 DIAGNOSIS — Z1379 Encounter for other screening for genetic and chromosomal anomalies: Secondary | ICD-10-CM

## 2019-07-20 DIAGNOSIS — Z803 Family history of malignant neoplasm of breast: Secondary | ICD-10-CM

## 2019-07-20 DIAGNOSIS — Z170 Estrogen receptor positive status [ER+]: Secondary | ICD-10-CM | POA: Diagnosis not present

## 2019-07-20 DIAGNOSIS — Z1509 Genetic susceptibility to other malignant neoplasm: Secondary | ICD-10-CM

## 2019-07-20 DIAGNOSIS — Z8543 Personal history of malignant neoplasm of ovary: Secondary | ICD-10-CM

## 2019-07-20 DIAGNOSIS — Z853 Personal history of malignant neoplasm of breast: Secondary | ICD-10-CM

## 2019-07-20 DIAGNOSIS — Z510 Encounter for antineoplastic radiation therapy: Secondary | ICD-10-CM | POA: Diagnosis not present

## 2019-07-20 NOTE — Progress Notes (Signed)
HPI:  [name] was previously seen in the [facility] clinic due to a personal and family history of cancer and concerns regarding a hereditary predisposition to cancer. Please refer to our prior cancer genetics clinic note for more information regarding our discussion, assessment and recommendations, at the time. Ms. Grefe recent genetic test results were disclosed to her, as were recommendations warranted by these results. These results and recommendations are discussed in more detail below.  CANCER HISTORY:  Oncology History  Carcinoma of upper-outer quadrant of left breast in female, estrogen receptor positive ([place])  05/17/2019 Cancer Staging   Staging form: Breast, AJCC 8th Edition - Clinical stage from 05/17/2019: Stage IA (cT1c, cN0, cM0, G3, ER+, PR+, HER2-) - Signed by Eppie Gibson, MD on 05/19/2019   05/19/2019 Initial Diagnosis   Carcinoma of upper-outer quadrant of left breast in female, estrogen receptor positive ([place])     FAMILY HISTORY:  We obtained a detailed, 4-generation family history.  Significant diagnoses are listed below: Family History  Problem Relation Age of Onset  . Heart disease Mother   . Hyperlipidemia Mother   . Breast cancer Sister   . Cancer Sister        breast    Ms. Frankie has one adopted daughter. She has 2 maternal half sisters, 2 maternal half brothers. One of her half sisters had breast cancer at 85 and is living at 8.   Ms. Borger mother died at 84, no history of cancer. Patient had 1 maternal aunt who died at 48, no history of cancer. Her aunt did have a daughter who was diagnosed with breast cancer in her 56s. Her maternal grandmother died at 45 and maternal grandfather died at 27.  Ms. Hege does not have any information about her father's side of the family.  Ms. Borre is unaware of previous family history of genetic testing for hereditary cancer risks. Patient's maternal ancestors are of English descent, and  paternal ancestors are of unknown descent. There is no reported Ashkenazi Jewish ancestry. There is no known consanguinity.  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 The remainder of testing was normal.   The CancerNext-Expanded + RNAinsight gene panel offered by [employer] and includes sequencing and rearrangement analysis for the following 77 genes: IP, ALK, APC*, ATM*, AXIN2, BAP1, BARD1, BLM, BMPR1A, BRCA1*, BRCA2*, BRIP1*, CDC73, CDH1*,CDK4, CDKN1B, CDKN2A, CHEK2*, CTNNA1, DICER1, FANCC, FH, FLCN, GALNT12, KIF1B, LZTR1, MAX, MEN1, MET, MLH1*, MSH2*, MSH3, MSH6*, MUTYH*, NBN, NF1*, NF2, NTHL1, PALB2*, PHOX2B, PMS2*, POT1, PRKAR1A, PTCH1, PTEN*, RAD51C*, RAD51D*,RB1, RECQL, RET, SDHA, SDHAF2, SDHB, SDHC, SDHD, SMAD4, SMARCA4, SMARCB1, SMARCE1, STK11, SUFU, TMEM127, TP53*,TSC1, TSC2, VHL and XRCC2 (sequencing and deletion/duplication); EGFR, EGLN1, HOXB13, KIT, MITF, PDGFRA, POLD1 and POLE (sequencing only); EPCAM and GREM1 (deletion/duplication only). DNA and RNA analyses performed for * genes.  The test report has been scanned into EPIC and is located under the Molecular Pathology section of the Results Review tab.  A portion of the result report is included below for reference.     DISCUSSION: CHEK2  CHEK2  Clinical condition  The CHEK2 gene is associated with an increased risk for autosomal dominant adult-onset cancers, including breast, colon, thyroid, prostate, and possibly others. The risks of these cancers, particularly breast, have been determined to be both variant- and family history-dependent.  Lifetime risks for female breast cancer related to frameshift variants, such as 1100delC, have  been estimated to be 25-39% in heterozygotes. This is the variant that was found in Ms. Lacasse.There is also up to a 25% risk for a second breast cancer.    Inheritance  Hereditary predisposition to cancer due to pathogenic variants in the CHEK2 gene has autosomal dominant inheritance. This means that an individual with a pathogenic variant has a 50% chance of passing the condition onto their offspring. This result allows for the identification of at-risk relatives who can pursue testing for this specific familial variant. Many cases are inherited from a parent, but some cases can occur spontaneously (i.e., an individual with a pathogenic variant has parents who do not have it)  Management  For Ms. Bellucci, this result helps explain her personal history of breast cancer. Given her age, Ms. Terada may not need to follow the following guidelines and she should talk with her providers to determine if any increased screening is necessary for her.   Current screening guidelines from the [employer]  (NCCN) for those with a pathogenic CHEK2 variant are as follows:  Breast cancer: -Annual mammogram with consideration of tomosynthesis; also consider breast MRI with contrast beginning at age 64 or 29 years younger than youngest breast cancer diagnosis -Evidence of risk-reducing mastectomy is insufficient; manage based on family history  Colon cancer: -Colonoscopy screening every 5 years beginning at age 78 -If an individual has a first-degree relative with colorectal cancer, screening should begin 10 years prior to the relative's age at diagnosis if before 76. -If an individual has a personal history of colorectal cancer, screening recommendations should be based on recommendations for post-colorectal cancer resection.  It has been suggested that men with a CHEK2 pathogenic variant and a first-degree relative with prostate cancer have an annual prostate-specific antigen (PSA) analysis (PMID: 44010272). However, the benefits of screening for prostate cancer among men with a pathogenic variant in CHEK2 are uncertain (PMID:  53664403).  An individual's cancer risk and medical management are not determined by genetic test results alone. Overall cancer risk assessment incorporates additional factors, including personal medical history, family history, and any available genetic information that may result in a personalized plan for cancer prevention and surveillance.  Knowing if a pathogenic CHEK2 variant is present is advantageous. At-risk relatives can be identified, enabling pursuit of a diagnostic evaluation. Information regarding hereditary cancer susceptibility genes is constantly evolving, and more clinically relevant data regarding CHEK2 is likely to become available in the near future. Awareness of this cancer predisposition encourages patients and their providers to inform at-risk family members, to diligently follow condition-specific screening protocols, and to be vigilant in maintaining close and regular contact with their local genetics clinic in anticipation of new information.  FAMILY MEMBERS:It is important that all of Ms. Latino'srelatives (both men and women) know of the presence of this gene mutation. Genetic testing can sort out who in your family is at risk and who is not.    It is unclear which side of the family this is coming from. She does not have information about her paternal side of the family, and there are a few breast cancers on her maternal side. If this did come from her mother, AlisonRasmus's half siblings are also at 50% risk to have inherited the mutationfound in her. We recommend Ms. Mckeon half siblings have genetic testing for the CHEK2 mutation, as identifying the presence of this mutation would allow them to also take advantage of risk-reducing measures.   PLAN:  1. These results will be  made available to Dr. Benay Spice and to Ms. Ples Specter PCP, Dr. Fara Olden. She would like Dr. Benay Spice to follow her for this indication.  2. Ms. Bucklin share this information with  her family members. She believes her half sister, Lonna Cobb, will be interested in testing. Arbie Cookey lives in [place]. She gives me permission to discuss this result with her. Her living half-brother resides in [place], she will share these results with him. She knows that she can give my number to any of her relatives and I can help coordinate testing.   SUPPORT AND RESOURCES: We provided information about two support groups for hereditary cancer syndrome information and support, Facing Our Risk (www.facingourrisk.com) and Bright Pink (www.brightpink.org) which some people have found useful. They provide opportunities to speak with other individuals from high-risk families.  Lastly,we encouragedMs.Cardarelli to remain in contact with cancer genetics on an annual basis so we can updateherpersonal and family histories and let her know of any advances.Our contact number was provided.AlisonSatre's questions were answered tohersatisfaction, and sheknowssheis welcome to call us at anytime with additional questions or concerns.  Faith Rogue, MS, [facility] Genetic Counselor [place].[email or web address] [phone]

## 2019-07-20 NOTE — Telephone Encounter (Signed)
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 Discussed this result including cancer risks, management guidelines, and who needs testing next in the family.

## 2019-07-21 ENCOUNTER — Encounter: Payer: Self-pay | Admitting: Licensed Clinical Social Worker

## 2019-07-21 ENCOUNTER — Ambulatory Visit
Admission: RE | Admit: 2019-07-21 | Discharge: 2019-07-21 | Disposition: A | Discharge status: Home / Self Care | Payer: Medicare Other | Source: Ambulatory Visit | Attending: Radiation Oncology | Admitting: Radiation Oncology

## 2019-07-21 ENCOUNTER — Other Ambulatory Visit: Payer: Self-pay

## 2019-07-21 DIAGNOSIS — Z510 Encounter for antineoplastic radiation therapy: Secondary | ICD-10-CM | POA: Diagnosis not present

## 2019-07-21 DIAGNOSIS — Z170 Estrogen receptor positive status [ER+]: Secondary | ICD-10-CM | POA: Diagnosis not present

## 2019-07-21 DIAGNOSIS — C50412 Malignant neoplasm of upper-outer quadrant of left female breast: Secondary | ICD-10-CM | POA: Diagnosis not present

## 2019-07-22 ENCOUNTER — Other Ambulatory Visit: Payer: Self-pay

## 2019-07-22 ENCOUNTER — Ambulatory Visit
Admission: RE | Admit: 2019-07-22 | Discharge: 2019-07-22 | Disposition: A | Discharge status: Home / Self Care | Payer: Medicare Other | Source: Ambulatory Visit | Attending: Radiation Oncology | Admitting: Radiation Oncology

## 2019-07-22 DIAGNOSIS — Z170 Estrogen receptor positive status [ER+]: Secondary | ICD-10-CM | POA: Diagnosis not present

## 2019-07-22 DIAGNOSIS — C50412 Malignant neoplasm of upper-outer quadrant of left female breast: Secondary | ICD-10-CM | POA: Diagnosis not present

## 2019-07-22 DIAGNOSIS — Z510 Encounter for antineoplastic radiation therapy: Secondary | ICD-10-CM | POA: Diagnosis not present

## 2019-07-23 ENCOUNTER — Other Ambulatory Visit: Payer: Self-pay

## 2019-07-23 ENCOUNTER — Ambulatory Visit
Admission: RE | Admit: 2019-07-23 | Discharge: 2019-07-23 | Disposition: A | Discharge status: Home / Self Care | Payer: Medicare Other | Source: Ambulatory Visit | Attending: Radiation Oncology | Admitting: Radiation Oncology

## 2019-07-23 DIAGNOSIS — C50412 Malignant neoplasm of upper-outer quadrant of left female breast: Secondary | ICD-10-CM | POA: Diagnosis not present

## 2019-07-23 DIAGNOSIS — Z170 Estrogen receptor positive status [ER+]: Secondary | ICD-10-CM | POA: Diagnosis not present

## 2019-07-23 DIAGNOSIS — Z510 Encounter for antineoplastic radiation therapy: Secondary | ICD-10-CM | POA: Diagnosis not present

## 2019-07-26 ENCOUNTER — Ambulatory Visit
Admission: RE | Admit: 2019-07-26 | Discharge: 2019-07-26 | Disposition: A | Discharge status: Home / Self Care | Payer: Medicare Other | Source: Ambulatory Visit | Attending: Radiation Oncology | Admitting: Radiation Oncology

## 2019-07-26 ENCOUNTER — Other Ambulatory Visit: Payer: Self-pay

## 2019-07-26 DIAGNOSIS — Z510 Encounter for antineoplastic radiation therapy: Secondary | ICD-10-CM | POA: Diagnosis not present

## 2019-07-26 DIAGNOSIS — Z170 Estrogen receptor positive status [ER+]: Secondary | ICD-10-CM | POA: Diagnosis not present

## 2019-07-26 DIAGNOSIS — C50412 Malignant neoplasm of upper-outer quadrant of left female breast: Secondary | ICD-10-CM | POA: Diagnosis not present

## 2019-07-27 ENCOUNTER — Other Ambulatory Visit: Payer: Self-pay

## 2019-07-27 ENCOUNTER — Ambulatory Visit
Admission: RE | Admit: 2019-07-27 | Discharge: 2019-07-27 | Disposition: A | Discharge status: Home / Self Care | Payer: Medicare Other | Source: Ambulatory Visit | Attending: Radiation Oncology | Admitting: Radiation Oncology

## 2019-07-27 DIAGNOSIS — Z510 Encounter for antineoplastic radiation therapy: Secondary | ICD-10-CM | POA: Diagnosis not present

## 2019-07-27 DIAGNOSIS — Z170 Estrogen receptor positive status [ER+]: Secondary | ICD-10-CM | POA: Diagnosis not present

## 2019-07-27 DIAGNOSIS — C50412 Malignant neoplasm of upper-outer quadrant of left female breast: Secondary | ICD-10-CM | POA: Diagnosis not present

## 2019-07-28 ENCOUNTER — Other Ambulatory Visit: Payer: Self-pay

## 2019-07-28 ENCOUNTER — Ambulatory Visit
Admission: RE | Admit: 2019-07-28 | Discharge: 2019-07-28 | Disposition: A | Discharge status: Home / Self Care | Payer: Medicare Other | Source: Ambulatory Visit | Attending: Radiation Oncology | Admitting: Radiation Oncology

## 2019-07-28 DIAGNOSIS — Z510 Encounter for antineoplastic radiation therapy: Secondary | ICD-10-CM | POA: Diagnosis not present

## 2019-07-28 DIAGNOSIS — C50412 Malignant neoplasm of upper-outer quadrant of left female breast: Secondary | ICD-10-CM | POA: Diagnosis not present

## 2019-07-28 DIAGNOSIS — Z170 Estrogen receptor positive status [ER+]: Secondary | ICD-10-CM | POA: Diagnosis not present

## 2019-07-29 ENCOUNTER — Other Ambulatory Visit: Payer: Self-pay

## 2019-07-29 ENCOUNTER — Ambulatory Visit
Admission: RE | Admit: 2019-07-29 | Discharge: 2019-07-29 | Disposition: A | Discharge status: Home / Self Care | Payer: Medicare Other | Source: Ambulatory Visit | Attending: Radiation Oncology | Admitting: Radiation Oncology

## 2019-07-29 DIAGNOSIS — Z170 Estrogen receptor positive status [ER+]: Secondary | ICD-10-CM | POA: Diagnosis not present

## 2019-07-29 DIAGNOSIS — C50412 Malignant neoplasm of upper-outer quadrant of left female breast: Secondary | ICD-10-CM | POA: Diagnosis not present

## 2019-07-29 DIAGNOSIS — Z510 Encounter for antineoplastic radiation therapy: Secondary | ICD-10-CM | POA: Diagnosis not present

## 2019-07-30 ENCOUNTER — Ambulatory Visit
Admission: RE | Admit: 2019-07-30 | Discharge: 2019-07-30 | Disposition: A | Discharge status: Home / Self Care | Payer: Medicare Other | Source: Ambulatory Visit | Attending: Radiation Oncology | Admitting: Radiation Oncology

## 2019-07-30 ENCOUNTER — Other Ambulatory Visit: Payer: Self-pay

## 2019-07-30 DIAGNOSIS — Z510 Encounter for antineoplastic radiation therapy: Secondary | ICD-10-CM | POA: Diagnosis not present

## 2019-07-30 DIAGNOSIS — Z170 Estrogen receptor positive status [ER+]: Secondary | ICD-10-CM | POA: Diagnosis not present

## 2019-07-30 DIAGNOSIS — C50412 Malignant neoplasm of upper-outer quadrant of left female breast: Secondary | ICD-10-CM | POA: Diagnosis not present

## 2019-08-02 ENCOUNTER — Ambulatory Visit
Admission: RE | Admit: 2019-08-02 | Discharge: 2019-08-02 | Disposition: A | Discharge status: Home / Self Care | Payer: Medicare Other | Source: Ambulatory Visit | Attending: Radiation Oncology | Admitting: Radiation Oncology

## 2019-08-02 ENCOUNTER — Other Ambulatory Visit: Payer: Self-pay

## 2019-08-02 DIAGNOSIS — C50412 Malignant neoplasm of upper-outer quadrant of left female breast: Secondary | ICD-10-CM | POA: Diagnosis not present

## 2019-08-02 DIAGNOSIS — Z170 Estrogen receptor positive status [ER+]: Secondary | ICD-10-CM | POA: Diagnosis not present

## 2019-08-02 DIAGNOSIS — Z510 Encounter for antineoplastic radiation therapy: Secondary | ICD-10-CM | POA: Diagnosis not present

## 2019-08-03 ENCOUNTER — Ambulatory Visit
Admission: RE | Admit: 2019-08-03 | Discharge: 2019-08-03 | Disposition: A | Discharge status: Home / Self Care | Payer: Medicare Other | Source: Ambulatory Visit | Attending: Radiation Oncology | Admitting: Radiation Oncology

## 2019-08-03 ENCOUNTER — Other Ambulatory Visit: Payer: Self-pay

## 2019-08-03 DIAGNOSIS — C50412 Malignant neoplasm of upper-outer quadrant of left female breast: Secondary | ICD-10-CM | POA: Diagnosis not present

## 2019-08-03 DIAGNOSIS — Z510 Encounter for antineoplastic radiation therapy: Secondary | ICD-10-CM | POA: Diagnosis not present

## 2019-08-03 DIAGNOSIS — Z170 Estrogen receptor positive status [ER+]: Secondary | ICD-10-CM | POA: Diagnosis not present

## 2019-08-04 ENCOUNTER — Other Ambulatory Visit: Payer: Self-pay

## 2019-08-04 ENCOUNTER — Ambulatory Visit
Admission: RE | Admit: 2019-08-04 | Discharge: 2019-08-04 | Disposition: A | Discharge status: Home / Self Care | Payer: Medicare Other | Source: Ambulatory Visit | Attending: Radiation Oncology | Admitting: Radiation Oncology

## 2019-08-04 DIAGNOSIS — Z510 Encounter for antineoplastic radiation therapy: Secondary | ICD-10-CM | POA: Diagnosis not present

## 2019-08-04 DIAGNOSIS — C50412 Malignant neoplasm of upper-outer quadrant of left female breast: Secondary | ICD-10-CM | POA: Diagnosis not present

## 2019-08-04 DIAGNOSIS — Z170 Estrogen receptor positive status [ER+]: Secondary | ICD-10-CM | POA: Diagnosis not present

## 2019-08-05 ENCOUNTER — Other Ambulatory Visit: Payer: Self-pay

## 2019-08-05 ENCOUNTER — Ambulatory Visit
Admission: RE | Admit: 2019-08-05 | Discharge: 2019-08-05 | Disposition: A | Discharge status: Home / Self Care | Payer: Medicare Other | Source: Ambulatory Visit | Attending: Radiation Oncology | Admitting: Radiation Oncology

## 2019-08-05 DIAGNOSIS — Z510 Encounter for antineoplastic radiation therapy: Secondary | ICD-10-CM | POA: Diagnosis not present

## 2019-08-05 DIAGNOSIS — C50412 Malignant neoplasm of upper-outer quadrant of left female breast: Secondary | ICD-10-CM | POA: Diagnosis not present

## 2019-08-05 DIAGNOSIS — Z170 Estrogen receptor positive status [ER+]: Secondary | ICD-10-CM | POA: Diagnosis not present

## 2019-08-06 ENCOUNTER — Other Ambulatory Visit: Payer: Self-pay

## 2019-08-06 ENCOUNTER — Ambulatory Visit
Admission: RE | Admit: 2019-08-06 | Discharge: 2019-08-06 | Disposition: A | Discharge status: Home / Self Care | Payer: Medicare Other | Source: Ambulatory Visit | Attending: Radiation Oncology | Admitting: Radiation Oncology

## 2019-08-06 ENCOUNTER — Encounter: Payer: Self-pay | Admitting: Radiation Oncology

## 2019-08-06 DIAGNOSIS — Z170 Estrogen receptor positive status [ER+]: Secondary | ICD-10-CM | POA: Diagnosis not present

## 2019-08-06 DIAGNOSIS — Z510 Encounter for antineoplastic radiation therapy: Secondary | ICD-10-CM | POA: Diagnosis not present

## 2019-08-06 DIAGNOSIS — C50412 Malignant neoplasm of upper-outer quadrant of left female breast: Secondary | ICD-10-CM | POA: Diagnosis not present

## 2019-08-30 DIAGNOSIS — I10 Essential (primary) hypertension: Secondary | ICD-10-CM | POA: Diagnosis not present

## 2019-08-30 DIAGNOSIS — C50919 Malignant neoplasm of unspecified site of unspecified female breast: Secondary | ICD-10-CM | POA: Diagnosis not present

## 2019-08-30 DIAGNOSIS — E785 Hyperlipidemia, unspecified: Secondary | ICD-10-CM | POA: Diagnosis not present

## 2019-08-30 DIAGNOSIS — I2510 Atherosclerotic heart disease of native coronary artery without angina pectoris: Secondary | ICD-10-CM | POA: Diagnosis not present

## 2019-08-30 DIAGNOSIS — D510 Vitamin B12 deficiency anemia due to intrinsic factor deficiency: Secondary | ICD-10-CM | POA: Diagnosis not present

## 2019-08-30 DIAGNOSIS — M1990 Unspecified osteoarthritis, unspecified site: Secondary | ICD-10-CM | POA: Diagnosis not present

## 2019-08-30 DIAGNOSIS — E7800 Pure hypercholesterolemia, unspecified: Secondary | ICD-10-CM | POA: Diagnosis not present

## 2019-09-07 NOTE — Progress Notes (Signed)
I called the patient today about her upcoming follow-up appointment in radiation oncology.   Given the state of the COVID-19 pandemic, concerning case numbers in our community, and guidance from [facility], I offered a phone assessment with the patient to determine if coming to the clinic was necessary. She accepted.  I let the patient know that I had spoken with Dr. Isidore Moos, and she wanted them to know the importance of washing their hands for at least 20 seconds at a time, especially after going out in public, and before they eat.  Limit going out in public whenever possible. Do not touch your face, unless your hands are clean, such as when bathing. Get plenty of rest, eat well, and stay hydrated.   The patient denies any symptomatic concerns.  Specifically, they report good healing of their skin in the radiation fields.  Skin is intact.    I recommended that she continue skin care by applying oil or lotion with vitamin E to the skin in the radiation fields, BID, for 2 more months.  Continue follow-up with medical oncology - follow-up is scheduled on 10/22/19 with Dr. Benay Spice.  I explained that yearly mammograms are important for patients with intact breast tissue, and physical exams are important after mastectomy for patients that cannot undergo mammography.  I encouraged her to call if she had further questions or concerns about her healing. Otherwise, she will follow-up PRN in radiation oncology. Patient is pleased with this plan, and we will cancel her upcoming follow-up to reduce the risk of COVID-19 transmission.

## 2019-09-08 ENCOUNTER — Inpatient Hospital Stay
Admission: RE | Admit: 2019-09-08 | Discharge: 2019-09-08 | Disposition: A | Discharge status: Home / Self Care | Payer: Self-pay | Source: Ambulatory Visit | Attending: Radiation Oncology | Admitting: Radiation Oncology

## 2019-09-14 ENCOUNTER — Encounter: Payer: Self-pay | Admitting: *Deleted

## 2019-09-21 DIAGNOSIS — C50412 Malignant neoplasm of upper-outer quadrant of left female breast: Secondary | ICD-10-CM | POA: Diagnosis not present

## 2019-10-15 NOTE — Progress Notes (Signed)
  Patient Name: [name] MRN: [id number] DOB: 1933-11-09 Referring Physician: Betsy Coder (Profile Not Attached) Date of Service: 08/06/2019 [facility], [place]                                                        End Of Treatment Note   Diagnoses: C50.412-Malignant neoplasm of upper-outer quadrant of left female breast   Cancer Staging: - Clinical stage from 05/17/2019: Stage IA (cT1c, cN0, cM0, G3, ER+, PR+, HER2-) - Signed by Eppie Gibson, MD on 05/19/2019  pT1cNx  Intent: Curative  Radiation Treatment Dates: 07/12/2019 through 08/06/2019 Site Technique Total Dose (Gy) Dose per Fx (Gy) Completed Fx Beam Energies  Breast: Breast_Lt 3D 40.05/40.05 2.67 15/15 6X  Breast: Breast_Lt_Bst specialPort 10/10 2 5/5 9E   Narrative: The patient tolerated radiation therapy relatively well.    Plan: The patient will follow-up with radiation oncology in 1 month or as needed.  -----------------------------------  Eppie Gibson, MD

## 2019-10-22 ENCOUNTER — Telehealth: Payer: Self-pay | Admitting: Oncology

## 2019-10-22 ENCOUNTER — Inpatient Hospital Stay: Payer: Medicare Other | Attending: Oncology | Admitting: Oncology

## 2019-10-22 ENCOUNTER — Inpatient Hospital Stay: Payer: Medicare Other

## 2019-10-22 ENCOUNTER — Other Ambulatory Visit: Payer: Self-pay

## 2019-10-22 ENCOUNTER — Telehealth: Payer: Self-pay

## 2019-10-22 VITALS — BP 134/47 | HR 64 | Temp 97.8°F | Resp 17 | Ht 67.0 in | Wt 134.3 lb

## 2019-10-22 DIAGNOSIS — Z170 Estrogen receptor positive status [ER+]: Secondary | ICD-10-CM | POA: Diagnosis not present

## 2019-10-22 DIAGNOSIS — C50912 Malignant neoplasm of unspecified site of left female breast: Secondary | ICD-10-CM | POA: Diagnosis not present

## 2019-10-22 DIAGNOSIS — Z923 Personal history of irradiation: Secondary | ICD-10-CM | POA: Insufficient documentation

## 2019-10-22 DIAGNOSIS — C50412 Malignant neoplasm of upper-outer quadrant of left female breast: Secondary | ICD-10-CM

## 2019-10-22 DIAGNOSIS — D473 Essential (hemorrhagic) thrombocythemia: Secondary | ICD-10-CM | POA: Diagnosis not present

## 2019-10-22 DIAGNOSIS — Z79811 Long term (current) use of aromatase inhibitors: Secondary | ICD-10-CM | POA: Insufficient documentation

## 2019-10-22 DIAGNOSIS — D75839 Thrombocytosis, unspecified: Secondary | ICD-10-CM

## 2019-10-22 LAB — CBC WITH DIFFERENTIAL (CANCER CENTER ONLY)
Abs Immature Granulocytes: 0.11 10*3/uL — ABNORMAL HIGH (ref 0.00–0.07)
Basophils Absolute: 0.2 10*3/uL — ABNORMAL HIGH (ref 0.0–0.1)
Basophils Relative: 2 %
Eosinophils Absolute: 0.3 10*3/uL (ref 0.0–0.5)
Eosinophils Relative: 3 %
HCT: 38.4 % (ref 36.0–46.0)
Hemoglobin: 12.8 g/dL (ref 12.0–15.0)
Immature Granulocytes: 1 %
Lymphocytes Relative: 10 %
Lymphs Abs: 1 10*3/uL (ref 0.7–4.0)
MCH: 31.5 pg (ref 26.0–34.0)
MCHC: 33.3 g/dL (ref 30.0–36.0)
MCV: 94.6 fL (ref 80.0–100.0)
Monocytes Absolute: 0.7 10*3/uL (ref 0.1–1.0)
Monocytes Relative: 7 %
Neutro Abs: 8.1 10*3/uL — ABNORMAL HIGH (ref 1.7–7.7)
Neutrophils Relative %: 77 %
Platelet Count: 472 10*3/uL — ABNORMAL HIGH (ref 150–400)
RBC: 4.06 MIL/uL (ref 3.87–5.11)
RDW: 13.9 % (ref 11.5–15.5)
WBC Count: 10.5 10*3/uL (ref 4.0–10.5)
nRBC: 0 % (ref 0.0–0.2)

## 2019-10-22 NOTE — Telephone Encounter (Signed)
Gave avs and calendar ° °

## 2019-10-22 NOTE — Telephone Encounter (Signed)
TC to pt per Dr Benay Spice to ask if patient has been having any blood in her stool? Patient responded no blood has been in her stool. Also asked if patient has had a recent Coloscopy? Patient stated that she hasn't had one since about 2009 when she had her surgery. Asked patient if she had a GI doctor? Patient stated that she did not have a GI and would like to be referred to one. I let patient know that her Ferritin and CBC will be checked again in the next 2-3 months and sent over schedule message to get this scheduled for her.

## 2019-10-22 NOTE — Progress Notes (Signed)
[place] OFFICE PROGRESS NOTE   Diagnosis: Breast cancer  INTERVAL HISTORY:   Ms. Galgano completed radiation on 08/06/2019.  She then began Arimidex.  No hot flashes.  She has discomfort in the legs when going downstairs.  No other arthralgias.  Objective:  Vital signs in last 24 hours:  Blood pressure (!) 134/47, pulse 64, temperature 97.8 F (36.6 C), temperature source Temporal, resp. rate 17, height '5\' 7"'$  (1.702 m), weight 134 lb 4.8 oz (60.9 kg), last menstrual period 10/07/1974, SpO2 97 %.    Limited physical examination secondary to distancing with the Covid pandemic Lymphatics: No cervical, supraclavicular, or axillary nodes GI: No hepatomegaly Breasts: Bilateral breast biopsy scars without evidence of recurrent cancer.  No mass in either breast.   Lab Results:  Lab Results  Component Value Date   WBC 10.5 10/22/2019   HGB 12.8 10/22/2019   HCT 38.4 10/22/2019   MCV 94.6 10/22/2019   PLT 472 (H) 10/22/2019   NEUTROABS 8.1 (H) 10/22/2019    CMP  Lab Results  Component Value Date   NA 141 06/07/2019   K 4.3 06/07/2019   CL 110 06/07/2019   CO2 24 06/07/2019   GLUCOSE 128 (H) 06/07/2019   BUN 32 (H) 06/07/2019   CREATININE 1.30 (H) 06/07/2019   CALCIUM 9.5 06/07/2019   PROT 6.1 04/11/2009   ALBUMIN 3.3 (L) 04/11/2009   AST 22 04/11/2009   ALT 41 (H) 04/11/2009   ALKPHOS 101 04/11/2009   BILITOT 0.7 04/11/2009   GFRNONAA 38 (L) 06/07/2019   GFRAA 44 (L) 06/07/2019    Medications: I have reviewed the patient's current medications.   Assessment/Plan: 1. 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 2. Remote hysterectomy and bilateral salpingo-oophorectomy with the cytology from [facility] confirming numerous "psammoma  bodies." 3. 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  a. Status post a needle localized lumpectomy and sentinel lymph node biopsy 04/13/2009 with the pathology confirming high-grade ductal carcinoma in situ with an associated 0.26 mm invasive carcinoma, ER positive, PR positive, and HER2 positive. b. Status post adjuvant right breast radiation completed 06/19/2009. c. Initiation of Arimidex 05/01/2009.  Completed 5 years in July 2015 4. Family history of breast cancer. 5. History of Clostridium difficile colitis. 6. Frequent bowel movements following the bowel resection in 2009. 7. Hypertension 8. 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 Elevated platelet count      Disposition: Ms. Duffell is tolerating the anastrozole well.  She will return for an office visit in 8 months.  She will schedule a mammogram for August or September.  The platelet count remains mildly elevated.  This may be a normal variant or she may have an early myeloproliferative disorder.  She is taking aspirin.  We will consider additional diagnostic evaluation for a progressive rise in the platelet count.  She will have a repeat CBC when she returns in 8 months.  Betsy Coder, MD  10/22/2019  11:01 AM

## 2019-10-23 ENCOUNTER — Ambulatory Visit: Payer: Medicare Other | Attending: Internal Medicine

## 2019-10-23 DIAGNOSIS — Z23 Encounter for immunization: Secondary | ICD-10-CM | POA: Insufficient documentation

## 2019-10-23 NOTE — Progress Notes (Signed)
   Covid-19 Vaccination Clinic  Name:  [name]    MRN: [id number] DOB: Apr 06, 1934  10/23/2019  [name] was observed post Covid-19 immunization for 30 minutes based on pre-vaccination screening without incidence. She was provided with Vaccine Information Sheet and instruction to access the V-Safe system.   [name] was instructed to call 911 with any severe reactions post vaccine: Marland Kitchen Difficulty breathing  . Swelling of your face and throat  . A fast heartbeat  . A bad rash all over your body  . Dizziness and weakness    Immunizations Administered    Name Date Dose VIS Date Route   Pfizer COVID-19 Vaccine 10/23/2019  1:15 PM 0.3 mL 09/17/2019 Intramuscular   Manufacturer: [place]   Lot: [id number]   [place]: [id number]

## 2019-10-25 ENCOUNTER — Telehealth: Payer: Self-pay | Admitting: Oncology

## 2019-10-25 DIAGNOSIS — Z7901 Long term (current) use of anticoagulants: Secondary | ICD-10-CM | POA: Diagnosis not present

## 2019-10-25 DIAGNOSIS — Z5181 Encounter for therapeutic drug level monitoring: Secondary | ICD-10-CM | POA: Diagnosis not present

## 2019-10-25 DIAGNOSIS — E538 Deficiency of other specified B group vitamins: Secondary | ICD-10-CM | POA: Diagnosis not present

## 2019-10-25 DIAGNOSIS — Z79899 Other long term (current) drug therapy: Secondary | ICD-10-CM | POA: Diagnosis not present

## 2019-10-25 NOTE — Telephone Encounter (Signed)
Scheduled per 1/15 sch msg. Called and spoke with pt, confirmed 4/15 appt

## 2019-11-12 ENCOUNTER — Ambulatory Visit: Payer: Medicare Other | Attending: Internal Medicine

## 2019-11-12 DIAGNOSIS — Z23 Encounter for immunization: Secondary | ICD-10-CM

## 2019-11-12 NOTE — Progress Notes (Signed)
   Covid-19 Vaccination Clinic  Name:  [name]    MRN: [id number] DOB: 1934/08/21  11/12/2019  [name] was observed post Covid-19 immunization for 15 minutes without incidence. She was provided with Vaccine Information Sheet and instruction to access the V-Safe system.   [name] was instructed to call 911 with any severe reactions post vaccine: Marland Kitchen Difficulty breathing  . Swelling of your face and throat  . A fast heartbeat  . A bad rash all over your body  . Dizziness and weakness    Immunizations Administered    Name Date Dose VIS Date Route   Pfizer COVID-19 Vaccine 11/12/2019  1:56 PM 0.3 mL 09/17/2019 Intramuscular   Manufacturer: [place]   Lot: [id number]   [place]: [id number]

## 2019-11-29 DIAGNOSIS — I2510 Atherosclerotic heart disease of native coronary artery without angina pectoris: Secondary | ICD-10-CM | POA: Diagnosis not present

## 2019-11-29 DIAGNOSIS — D510 Vitamin B12 deficiency anemia due to intrinsic factor deficiency: Secondary | ICD-10-CM | POA: Diagnosis not present

## 2019-11-29 DIAGNOSIS — I10 Essential (primary) hypertension: Secondary | ICD-10-CM | POA: Diagnosis not present

## 2019-11-29 DIAGNOSIS — E538 Deficiency of other specified B group vitamins: Secondary | ICD-10-CM | POA: Diagnosis not present

## 2019-11-29 DIAGNOSIS — C50919 Malignant neoplasm of unspecified site of unspecified female breast: Secondary | ICD-10-CM | POA: Diagnosis not present

## 2019-11-29 DIAGNOSIS — E7800 Pure hypercholesterolemia, unspecified: Secondary | ICD-10-CM | POA: Diagnosis not present

## 2019-11-29 DIAGNOSIS — M1990 Unspecified osteoarthritis, unspecified site: Secondary | ICD-10-CM | POA: Diagnosis not present

## 2019-11-29 DIAGNOSIS — E785 Hyperlipidemia, unspecified: Secondary | ICD-10-CM | POA: Diagnosis not present

## 2019-12-14 DIAGNOSIS — I129 Hypertensive chronic kidney disease with stage 1 through stage 4 chronic kidney disease, or unspecified chronic kidney disease: Secondary | ICD-10-CM | POA: Diagnosis not present

## 2019-12-14 DIAGNOSIS — E559 Vitamin D deficiency, unspecified: Secondary | ICD-10-CM | POA: Diagnosis not present

## 2019-12-14 DIAGNOSIS — D631 Anemia in chronic kidney disease: Secondary | ICD-10-CM | POA: Diagnosis not present

## 2019-12-14 DIAGNOSIS — N1832 Chronic kidney disease, stage 3b: Secondary | ICD-10-CM | POA: Diagnosis not present

## 2019-12-28 DIAGNOSIS — E538 Deficiency of other specified B group vitamins: Secondary | ICD-10-CM | POA: Diagnosis not present

## 2020-01-14 ENCOUNTER — Telehealth: Payer: Self-pay | Admitting: Interventional Cardiology

## 2020-01-14 NOTE — Telephone Encounter (Signed)
Pt c/o of Chest Pain: STAT if CP now or developed within 24 hours  1. Are you having CP right now? No   2. Are you experiencing any other symptoms (ex. SOB, nausea, vomiting, sweating)? SOB  3. How long have you been experiencing CP? 6-8 months (progressively gotten worse and consistent)  4. Is your CP continuous or coming and going? Coming and going   5. Have you taken Nitroglycerin? No  ? [name] is calling stating she has been experiencing chest discomfort that has been progressively getting worse and morse consistent for the past 6-8 months. She states it comes and goes and she also experiences SOB when it occurs. The patient has an appointment scheduled regarding this for 02/15/20 at 4:00 PM. Please advise.

## 2020-01-14 NOTE — Telephone Encounter (Signed)
Pt states that she has been having intermittent chest pressure for 6-8 months now.  Occurs more when exerting.  Gets SOB with episodes.  Pt also gets SOB just trying to carry a conversation.  Noticed she had to keep stopping to take deep breaths while speaking to me.  Chet pressure lasts anywhere from several minutes to long periods of time.  Denies lightheadedness or dizziness.  BPs are usually 150-160s/60s.  Symptoms have progressively gotten worse since they started.  Scheduled pt to come in Monday, 4/12 to see Cecilie Kicks, NP.  Advised pt to bring her BP readings with her.  Pt verbalized understanding and was appreciative for call.

## 2020-01-17 ENCOUNTER — Other Ambulatory Visit: Payer: Self-pay

## 2020-01-17 ENCOUNTER — Ambulatory Visit (INDEPENDENT_AMBULATORY_CARE_PROVIDER_SITE_OTHER): Payer: Medicare Other | Admitting: Cardiology

## 2020-01-17 ENCOUNTER — Encounter: Payer: Self-pay | Admitting: Cardiology

## 2020-01-17 VITALS — BP 180/62 | HR 62 | Ht 67.0 in | Wt 135.0 lb

## 2020-01-17 DIAGNOSIS — I10 Essential (primary) hypertension: Secondary | ICD-10-CM | POA: Diagnosis not present

## 2020-01-17 DIAGNOSIS — I6523 Occlusion and stenosis of bilateral carotid arteries: Secondary | ICD-10-CM

## 2020-01-17 DIAGNOSIS — I2510 Atherosclerotic heart disease of native coronary artery without angina pectoris: Secondary | ICD-10-CM | POA: Diagnosis not present

## 2020-01-17 DIAGNOSIS — R0602 Shortness of breath: Secondary | ICD-10-CM | POA: Diagnosis not present

## 2020-01-17 DIAGNOSIS — R079 Chest pain, unspecified: Secondary | ICD-10-CM | POA: Diagnosis not present

## 2020-01-17 DIAGNOSIS — E782 Mixed hyperlipidemia: Secondary | ICD-10-CM

## 2020-01-17 MED ORDER — CLONIDINE HCL 0.1 MG PO TABS: 0.1000 mg | ORAL_TABLET | Freq: Three times a day (TID) | ORAL | 3 refills | Status: DC

## 2020-01-17 NOTE — Progress Notes (Signed)
Cardiology Office Note   Date:  01/17/2020   ID:  [name], DOB 09/24/1934, MRN [id number]  PCP:  Leeroy Cha, MD  Cardiologist:  Dr. Tamala Julian     Chief Complaint  Patient presents with  . Chest Pain      History of Present Illness: [name] is a 86 y.o. female who presents for SOB and chest pressure for 6-8 months.    a hx of moderate CAD, hypertension, hyperlipidemia, and premature atrial contractions.  [name] has left breast cancer and is undergoing lumpectomy in early September.  She had right breast cancer 10 years ago.  In the past Her cardiac concerns are that on occasion if she does not sleep well the previous night, she will be tired and will lie on her sofa.  Occasionally when she lays down on the sofa she feels tightness in her chest.  This happens at no other time including moderate to heavy physical activity.  Sitting up allows the discomfort to resolve quickly.  She is not having lower extremity swelling or other cardiac issues.   Today she tells me she has been having chest discomfort initially Lt ant chest and then mid sternal.  When Lt ant chest it would radiate down Lt arm  She would be SOB as well.  In the case of Lt chest she was at rest.  With mid sternal seemed to be with activity.  She tries to garden and it becomes worse.  She is concerned because her husband has Parkinson's and she is his caregiver.  Her BP is also elevated and we discussed that may be cause.   Her last cath was in 2007 and she had the  largest of three diagonals and contains irregular 50-70% stenosis.  The LAD beyond the diagonal contains an eccentric 50-60% narrowing There is eccentric 30-40% ostial PDA narrowing.   Past Medical History:  Diagnosis Date  . Arthritis    HANDS AND BACK  . 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 Breast cancer ([place]) 2010   RIGHT  . CAD (coronary artery disease)   . Dysuria   . Family history of breast cancer   .  GERD (gastroesophageal reflux disease)    HX OF  . Heart murmur   . History of blood transfusion   . History of breast cancer ONCOLOGIST-- DR Benay Spice   DX 2010--  RIGHT BREAST DCIS  HIGH GRADE S/P LUMPECTOMY W/ SLN BX--  NO RECURRENCE  . History of kidney stones   . 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 History of skin cancer    excision basal cell  . Hyperlipidemia   . Hypertension   . Kidney stones 7/14  . Nocturia   . 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 Renal stones 2014  . Shingles 30 YRS AGO  . Ureteral calculi    bilateral    Past Surgical History:  Procedure Laterality Date  . APPENDECTOMY    . BREAST LUMPECTOMY Right 04-13-2009   W/ SLN BX  . BREAST LUMPECTOMY WITH RADIOACTIVE SEED LOCALIZATION Left 06/11/2019   Procedure: LEFT BREAST LUMPECTOMY WITH RADIOACTIVE SEED LOCALIZATION;  Surgeon: Jovita Kussmaul, MD;  Location: [facility];  Service: General;  Laterality: Left;  . CARDIAC CATHETERIZATION  11-11-2005 DR Mallie Mussel  [facility]   MODERATE LAD DISEASE/ NORMAL LVF/ EF 65-75%  . CATARACT EXTRACTION W/ INTRAOCULAR LENS IMPLANT Bilateral 04/26/2013  . CHOLECYSTECTOMY  1990   OPEN  . COLONOSCOPY    . CYSTOSCOPY W/ URETERAL STENT PLACEMENT Bilateral 05/07/2013   Procedure: CYSTOSCOPY WITH STENT REPLACEMENTS;  Surgeon: Alexis Frock, MD;  Location: [facility];  Service: Urology;  Laterality: Bilateral;  . CYSTOSCOPY WITH BIOPSY N/A 04/07/2013   Procedure: CYSTOSCOPY WITH BIOPSY and fulgeration;  Surgeon: Alexis Frock, MD;  Location: [facility];  Service: Urology;  Laterality: N/A;  . CYSTOSCOPY WITH RETROGRADE PYELOGRAM, URETEROSCOPY AND STENT PLACEMENT Bilateral 05/07/2013   Procedure: CYSTOSCOPY WITH RETROGRADE PYELOGRAM, URETEROSCOPY ;  Surgeon:  Alexis Frock, MD;  Location: [facility];  Service: Urology;  Laterality: Bilateral;  . CYSTOSCOPY WITH RETROGRADE PYELOGRAM, URETEROSCOPY AND STENT PLACEMENT Bilateral 01/06/2019   Procedure: CYSTOSCOPY WITH RETROGRADE PYELOGRAM, URETEROSCOPY AND STENT PLACEMENT, FIRST STAGE;  Surgeon: Alexis Frock, MD;  Location: [facility];  Service: Urology;  Laterality: Bilateral;  . CYSTOSCOPY WITH RETROGRADE PYELOGRAM, URETEROSCOPY AND STENT PLACEMENT Bilateral 01/27/2019   Procedure: CYSTOSCOPY WITH RETROGRADE PYELOGRAM, URETEROSCOPY AND STENT PLACEMENT;  Surgeon: Alexis Frock, MD;  Location: [facility];  Service: Urology;  Laterality: Bilateral;  75 MINS  . CYSTOSCOPY WITH STENT PLACEMENT Bilateral 04/07/2013   Procedure: CYSTOSCOPY WITH STENT PLACEMENT;  Surgeon: Alexis Frock, MD;  Location: [facility];  Service: Urology;  Laterality: Bilateral;  . DX LAPAROSCOPY W/ PERITONEAL AND OMENTAL BX'S AND WASHINGS  02-16-2008  . EXCISION RIGHT BREAST MASS  02-10-2002  . EXP. LAP. EXTENSIVE ADHESIOLYSIS/ RESECTION TERMINAL ILEUM , ASCENDING AND DESCENDING COLON WITH CREATION ILEOSTOMY AND MUCOUS FISTULA  02-19-2008   PERFERATION AND ABD. CANCER--  TAKEDOWN ILEOSTOMY 08-23-2008  . HOLMIUM LASER APPLICATION Bilateral [id number]   Procedure: HOLMIUM LASER APPLICATION;  Surgeon: Alexis Frock, MD;  Location: [facility];  Service: Urology;  Laterality: Bilateral;  . HOLMIUM LASER APPLICATION Bilateral [id number]   Procedure: HOLMIUM LASER APPLICATION;  Surgeon: Alexis Frock, MD;  Location: [facility];  Service: Urology;  Laterality: Bilateral;  . ILEOSTOMY CLOSURE  08/2008  . TOTAL ABDOMINAL HYSTERECTOMY W/ BILATERAL SALPINGOOPHORECTOMY  1976  . TOTAL ABDOMINAL HYSTERECTOMY W/ BILATERAL SALPINGOOPHORECTOMY  1976  . VULVAR LESION REMOVAL N/A 08/22/2014   Procedure: EXCISION OF VULVAR CYST  ;  Surgeon: Lyman Speller, MD;  Location: [facility];  Service: Gynecology;  Laterality: N/A;  . WOUND DEBRIDEMENT  05/28/2012     Procedure: DEBRIDEMENT ABDOMINAL WOUND;  Surgeon: Haywood Lasso, MD;  Location: [facility];  Service: General;  Laterality: N/A;  excision chronic wound abdominal wall     Current Outpatient Medications  Medication Sig Dispense Refill  . amLODipine (NORVASC) 10 MG tablet Take 1 tablet (10 mg total) by mouth daily. 90 tablet 1  . anastrozole (ARIMIDEX) 1 MG tablet Take 1 tablet (1 mg total) by mouth daily. 90 tablet 3  . aspirin EC 81 MG tablet Take 81 mg by mouth daily.    . BELSOMRA 10 MG TABS Take 10 mg by mouth at bedtime as needed (sleep).     . Biotin 10000 MCG TABS Take 10,000 mcg by mouth daily.    . Calcium Carbonate-Vitamin D (CALTRATE 600+D PO) Take 1 tablet by mouth 2 (two) times daily.    . carvedilol (COREG) 25 MG tablet Take 1 tablet (25 mg total) by mouth 2 (two) times daily with a meal. Please keep 7/23 appointment for additional refills thanks. 180 tablet  0  . Cholecalciferol (VITAMIN D3) 50 MCG (2000 UT) TABS Take 2,000 Units by mouth daily with lunch.     . cloNIDine (CATAPRES) 0.1 MG tablet Take 1 tablet (0.1 mg total) by mouth every 8 (eight) hours. 270 tablet 3  . hydrALAZINE (APRESOLINE) 50 MG tablet Take 50 mg by mouth 3 (three) times daily.    Marland Kitchen PROCTOZONE-HC 2.5 % rectal cream PLACE 1 APPLICATION RECTALLY 2 TIMES A DAY AS NEEDED FOR HEMORRHOIDS. 60 g 11   No current facility-administered medications for this visit.    Allergies:   Patient has no known allergies.    Social History:  The patient  reports that she has never smoked. She has never used smokeless tobacco. She reports that she does not drink alcohol or use drugs.   Family History:  The patient's family history includes Breast cancer in her sister; Cancer in her sister; Heart disease in her mother; Hyperlipidemia in her mother.    ROS:  General:no colds or fevers, no weight changes Skin:no rashes or ulcers HEENT:no blurred vision, no congestion CV:see HPI PUL:see HPI GI:no  diarrhea constipation or melena, no indigestion GU:no hematuria, no dysuria MS:no joint pain, no claudication Neuro:no syncope, no lightheadedness Endo:no diabetes, no thyroid disease  Wt Readings from Last 3 Encounters:  01/17/20 135 lb (61.2 kg)  10/22/19 134 lb 4.8 oz (60.9 kg)  07/06/19 131 lb 9.6 oz (59.7 kg)     PHYSICAL EXAM: VS:  BP (!) 180/62   Pulse 62   Ht 5\' 7"  (1.702 m)   Wt 135 lb (61.2 kg)   LMP 10/07/1974   BMI 21.14 kg/m  , BMI Body mass index is 21.14 kg/m. General:Pleasant affect, NAD Skin:Warm and dry, brisk capillary refill HEENT:normocephalic, sclera clear, mucus membranes moist Neck:supple, no JVD, no bruits  Heart:S1S2 RRR without murmur, gallup, rub or click Lungs:clear without rales, rhonchi, or wheezes [id number], non tender, + BS, do not palpate liver spleen or masses Ext:no lower ext edema, 2+ pedal pulses, 2+ radial pulses Neuro:alert and oriented X3, MAE, follows commands, + facial symmetry    EKG:  EKG is ordered today. The ekg ordered today demonstrates SR at 33 with chronic septal infact appearance no new changes stable EKG.     Recent Labs: 06/07/2019: BUN 32; Creatinine, Ser 1.30; Potassium 4.3; Sodium 141 10/22/2019: Hemoglobin 12.8; Platelet Count 472    Lipid Panel    Component Value Date/Time   CHOL  02/29/2008 0515    61        ATP III CLASSIFICATION:  <200     mg/dL   Desirable  200-239  mg/dL   Borderline High  >=240    mg/dL   High   TRIG 110 02/29/2008 0515       Other studies Reviewed: Additional studies/ records that were reviewed today include:  Cardiac cath 2007. CONCLUSION:  1.  Moderate LAD disease after the second diagonal with 50% narrowing. There      is also 50% narrowing in the proximal portion of the large second      diagonal.  PDA contains 30-40% narrowing at its ostium. Irregularities      also noted in the mid RCA.  2.  Normal LV function.  3.  Widely patent renal arteries.  4.  Dyspnea likely  due to the left ventricular diastolic dysfunction as      demonstrated by an elevated LV end-diastolic pressure of 27.   PLAN:  Aggressive medical therapy with more  aggressive blood pressure  control. Initially the patient has 60-80% nap narrowing in the left internal  carotid and all of the patient's vascular disease warrants management with a  grade L aggressive risk factor modification including lipid lowering and  aggressive blood pressure control.    ASSESSMENT AND PLAN: Chest pain with known CAD, we discussed exercise stress myoview vs lexi and she cannot quarantine for the exercise.  Will proceed with the lexiscan this week or next and discussed if Positive then would defer to Dr. Tamala Julian for possible cardiac cath.  --also discussed that if she needs to be away from home for a day or so she should discuss with daughter about plan. Not that we need this now.   HTN BP is elevated will increase clonidine to 0.1 mg every 8 hours from BID.  Goal would be 130/80 or less  CAD see above. Per cath 2007  HLD goal < 70 and was 37 03/2019  Carotid disease last doppler 02/2018 will follow up once chest pain sorted.   Follow up in 3 weeks           Increase clonidine to every  8 hours and lexiscan myoviwe.  Discussed exercise but with quarentine   Current medicines are reviewed with the patient today.  The patient Has no concerns regarding medicines.  The following changes have been made:  See above Labs/ tests ordered today include:see above  Disposition:   FU:  see above  Signed, Cecilie Kicks, NP  01/17/2020 5:27 PM    [facility] [place], [address] [place], [place] Phone: [phone]; Fax: [phone]

## 2020-01-17 NOTE — Patient Instructions (Addendum)
Medication Instructions:  Your physician has recommended you make the following change in your medication:  1-INCREASE Clonidine 0.1 mg by mouth every 8 hours.   *If you need a refill on your cardiac medications before your next appointment, please call your pharmacy*  Lab Work: If you have labs (blood work) drawn today and your tests are completely normal, you will receive your results only by: Marland Kitchen MyChart Message (if you have MyChart) OR . A paper copy in the mail If you have any lab test that is abnormal or we need to change your treatment, we will call you to review the results.  Testing/Procedures: Your physician has requested that you have a lexiscan myoview this week or next week. For further information please visit [email or web address]. Please follow instruction sheet, as given.  Follow-Up: At [facility], you and your health needs are our priority.  As part of our continuing mission to provide you with exceptional heart care, we have created designated Provider Care Teams.  These Care Teams include your primary Cardiologist (physician) and Advanced Practice Providers (APPs -  Physician Assistants and Nurse Practitioners) who all work together to provide you with the care you need, when you need it.  We recommend signing up for the patient portal called "MyChart".  Sign up information is provided on this After Visit Summary.  MyChart is used to connect with patients for Virtual Visits (Telemedicine).  Patients are able to view lab/test results, encounter notes, upcoming appointments, etc.  Non-urgent messages can be sent to your provider as well.   To learn more about what you can do with MyChart, go to [email or web address].    Your next appointment:   3 weeks  The format for your next appointment:   In Person  Provider:   You may see Sinclair Grooms, MD or one of the following Advanced Practice Providers on your designated Care Team:    Truitt Merle, NP  Cecilie Kicks,  NP  Kathyrn Drown, NP

## 2020-01-19 ENCOUNTER — Telehealth (HOSPITAL_COMMUNITY): Payer: Self-pay | Admitting: *Deleted

## 2020-01-19 ENCOUNTER — Encounter (HOSPITAL_COMMUNITY): Payer: Self-pay | Admitting: *Deleted

## 2020-01-19 NOTE — Telephone Encounter (Signed)
Left message on voicemail in reference to upcoming appointment scheduled for 01/24/20. Phone number given for a call back so details instructions can be given. My chart letter sent with instructions.  [name]

## 2020-01-19 NOTE — Telephone Encounter (Signed)
Patient given detailed instructions per Myocardial Perfusion Study Information Sheet for the test on 01/24/20. Patient notified to arrive 15 minutes early and that it is imperative to arrive on time for appointment to keep from having the test rescheduled.  If you need to cancel or reschedule your appointment, please call the office within 24 hours of your appointment. . Patient verbalized understanding. Kirstie Peri

## 2020-01-20 ENCOUNTER — Other Ambulatory Visit: Payer: Self-pay | Admitting: *Deleted

## 2020-01-20 ENCOUNTER — Inpatient Hospital Stay: Payer: Medicare Other | Attending: Oncology

## 2020-01-20 ENCOUNTER — Other Ambulatory Visit: Payer: Self-pay

## 2020-01-20 DIAGNOSIS — D75839 Thrombocytosis, unspecified: Secondary | ICD-10-CM

## 2020-01-20 DIAGNOSIS — C50412 Malignant neoplasm of upper-outer quadrant of left female breast: Secondary | ICD-10-CM | POA: Diagnosis not present

## 2020-01-20 DIAGNOSIS — D473 Essential (hemorrhagic) thrombocythemia: Secondary | ICD-10-CM

## 2020-01-20 DIAGNOSIS — Z170 Estrogen receptor positive status [ER+]: Secondary | ICD-10-CM | POA: Insufficient documentation

## 2020-01-20 LAB — CBC WITH DIFFERENTIAL (CANCER CENTER ONLY)
Abs Immature Granulocytes: 0.03 10*3/uL (ref 0.00–0.07)
Basophils Absolute: 0.1 10*3/uL (ref 0.0–0.1)
Basophils Relative: 2 %
Eosinophils Absolute: 0.3 10*3/uL (ref 0.0–0.5)
Eosinophils Relative: 4 %
HCT: 38.2 % (ref 36.0–46.0)
Hemoglobin: 12.3 g/dL (ref 12.0–15.0)
Immature Granulocytes: 0 %
Lymphocytes Relative: 11 %
Lymphs Abs: 0.8 10*3/uL (ref 0.7–4.0)
MCH: 30.8 pg (ref 26.0–34.0)
MCHC: 32.2 g/dL (ref 30.0–36.0)
MCV: 95.7 fL (ref 80.0–100.0)
Monocytes Absolute: 0.6 10*3/uL (ref 0.1–1.0)
Monocytes Relative: 7 %
Neutro Abs: 6.1 10*3/uL (ref 1.7–7.7)
Neutrophils Relative %: 76 %
Platelet Count: 378 10*3/uL (ref 150–400)
RBC: 3.99 MIL/uL (ref 3.87–5.11)
RDW: 14.6 % (ref 11.5–15.5)
WBC Count: 7.9 10*3/uL (ref 4.0–10.5)
nRBC: 0 % (ref 0.0–0.2)

## 2020-01-20 LAB — FERRITIN: Ferritin: 43 ng/mL (ref 11–307)

## 2020-01-24 ENCOUNTER — Telehealth: Payer: Self-pay

## 2020-01-24 ENCOUNTER — Ambulatory Visit (HOSPITAL_COMMUNITY): Payer: Medicare Other | Attending: Cardiovascular Disease

## 2020-01-24 ENCOUNTER — Other Ambulatory Visit: Payer: Self-pay

## 2020-01-24 DIAGNOSIS — R0602 Shortness of breath: Secondary | ICD-10-CM | POA: Diagnosis not present

## 2020-01-24 DIAGNOSIS — R079 Chest pain, unspecified: Secondary | ICD-10-CM | POA: Insufficient documentation

## 2020-01-24 LAB — MYOCARDIAL PERFUSION IMAGING
LV dias vol: 68 mL (ref 46–106)
LV sys vol: 18 mL
Peak HR: 80 {beats}/min
Rest HR: 57 {beats}/min
SDS: 4
SRS: 4
SSS: 8
TID: 1.01

## 2020-01-24 MED ORDER — TECHNETIUM TC 99M TETROFOSMIN IV KIT
10.6000 | PACK | Freq: Once | INTRAVENOUS | Status: AC | PRN
  Administered 2020-01-24: 10.6 via INTRAVENOUS
  Filled 2020-01-24: qty 11

## 2020-01-24 MED ORDER — TECHNETIUM TC 99M TETROFOSMIN IV KIT
30.9000 | PACK | Freq: Once | INTRAVENOUS | Status: AC | PRN
  Administered 2020-01-24: 30.9 via INTRAVENOUS
  Filled 2020-01-24: qty 31

## 2020-01-24 MED ORDER — REGADENOSON 0.4 MG/5ML IV SOLN
0.4000 mg | Freq: Once | INTRAVENOUS | Status: AC
  Administered 2020-01-24: 0.4 mg via INTRAVENOUS

## 2020-01-24 NOTE — Telephone Encounter (Signed)
TC to pt per Dr Benay Spice to let her know that her platelet count is now normal, follow-up as scheduled. Patient verbalized understanding.

## 2020-01-26 ENCOUNTER — Telehealth: Payer: Self-pay | Admitting: Interventional Cardiology

## 2020-01-26 NOTE — Telephone Encounter (Signed)
Patient was calling in regards to her stress test she had 01-24-20. She wanted to know if Dr. Tamala Julian got the results yet. Please call to discuss results when they are ready

## 2020-01-26 NOTE — Telephone Encounter (Signed)
Spoke with pt and let her know that pumping function was fine and reading physician mentioned no ischemia.  Also spoke briefly about Mickel Baas and Dr. Thompson Caul recommendation of nitrate.  Advised once we hear back from one of them on dosing and further instructions, we would give her call.  Pt appreciative.

## 2020-01-27 ENCOUNTER — Telehealth: Payer: Self-pay | Admitting: *Deleted

## 2020-01-27 MED ORDER — ISOSORBIDE MONONITRATE ER 30 MG PO TB24: 30.0000 mg | ORAL_TABLET | Freq: Every day | ORAL | 3 refills | Status: DC

## 2020-01-27 NOTE — Telephone Encounter (Signed)
-----   Message from Isaiah Serge, NP sent at 01/26/2020  5:13 PM EDT ----- Let's add imdur 30 mg to pt's meds to see if symptoms improve.   Dr. Tamala Julian has reviewed.  If she is doing well on 02/10/20 she can skip my appt and just see Dr. Tamala Julian on 02/15/20.

## 2020-01-31 DIAGNOSIS — E538 Deficiency of other specified B group vitamins: Secondary | ICD-10-CM | POA: Diagnosis not present

## 2020-02-10 ENCOUNTER — Ambulatory Visit: Payer: Medicare Other | Admitting: Cardiology

## 2020-02-14 NOTE — Progress Notes (Signed)
Cardiology Office Note:    Date:  02/15/2020   ID:  [name], DOB 02-11-1934, MRN [id number]  PCP:  Leeroy Cha, MD  Cardiologist:  Sinclair Grooms, MD   Referring MD: Leeroy Cha,*   Chief Complaint  Patient presents with  . Coronary Artery Disease  . Chest Pain    History of Present Illness:    [name] is a 86 y.o. female with a hx of moderate CAD, hypertension, hyperlipidemia, and premature atrial contractions.  Since starting Imdur 30 mg daily, episodes of discomfort have improved.  She states that the episodes are a chest pressure that she describes with a Levine sign, rubbery feeling in her legs, and a feeling as though she may faint.  There are rare palpitations associated with it.  She does not feel as though her heart is racing.  Nuclear stress test was low risk.  Past Medical History:  Diagnosis Date  . Arthritis    HANDS AND BACK  . 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 Breast cancer ([facility]) 2010   RIGHT  . CAD (coronary artery disease)   . Dysuria   . Family history of breast cancer   . GERD (gastroesophageal reflux disease)    HX OF  . Heart murmur   . History of blood transfusion   . History of breast cancer ONCOLOGIST-- DR Benay Spice   DX 2010--  RIGHT BREAST DCIS  HIGH GRADE S/P LUMPECTOMY W/ SLN BX--  NO RECURRENCE  . History of kidney stones   . 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 History of skin cancer    excision basal cell  . Hyperlipidemia   . Hypertension   . Kidney stones 7/14  . Nocturia   . 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 Renal stones 2014  . Shingles 30 YRS AGO  . Ureteral calculi    bilateral    Past Surgical History:  Procedure Laterality Date  .  APPENDECTOMY    . BREAST LUMPECTOMY Right 04-13-2009   W/ SLN BX  . BREAST LUMPECTOMY WITH RADIOACTIVE SEED LOCALIZATION Left 06/11/2019   Procedure: LEFT BREAST LUMPECTOMY WITH RADIOACTIVE SEED LOCALIZATION;  Surgeon: Jovita Kussmaul, MD;  Location: [place];  Service: General;  Laterality: Left;  . CARDIAC CATHETERIZATION  11-11-2005 DR Daneen Schick   MODERATE LAD DISEASE/ NORMAL LVF/ EF 65-75%  . CATARACT EXTRACTION W/ INTRAOCULAR LENS IMPLANT Bilateral 04/26/2013  . CHOLECYSTECTOMY  1990   OPEN  . COLONOSCOPY    . CYSTOSCOPY W/ URETERAL STENT PLACEMENT Bilateral 05/07/2013   Procedure: CYSTOSCOPY WITH STENT REPLACEMENTS;  Surgeon: Alexis Frock, MD;  [facility];  Service: Urology;  Laterality: Bilateral;  . CYSTOSCOPY WITH BIOPSY N/A 04/07/2013   Procedure: CYSTOSCOPY WITH BIOPSY and fulgeration;  Surgeon: Alexis Frock, MD;  [facility];  Service: Urology;  Laterality: N/A;  . CYSTOSCOPY WITH RETROGRADE PYELOGRAM, URETEROSCOPY AND STENT PLACEMENT Bilateral 05/07/2013   Procedure: CYSTOSCOPY WITH RETROGRADE PYELOGRAM, URETEROSCOPY ;  Surgeon: Alexis Frock, MD;  Location: [facility];  Service: Urology;  Laterality: Bilateral;  . CYSTOSCOPY WITH RETROGRADE PYELOGRAM, URETEROSCOPY AND STENT PLACEMENT Bilateral 01/06/2019   Procedure: CYSTOSCOPY WITH RETROGRADE PYELOGRAM, URETEROSCOPY AND STENT PLACEMENT, FIRST STAGE;  Surgeon: Alexis Frock, MD;  Location: [facility];  Service: Urology;  Laterality: Bilateral;  . CYSTOSCOPY WITH RETROGRADE PYELOGRAM, URETEROSCOPY AND STENT PLACEMENT Bilateral 01/27/2019   Procedure: CYSTOSCOPY WITH RETROGRADE PYELOGRAM, URETEROSCOPY AND STENT PLACEMENT;  Surgeon: Alexis Frock, MD;  [facility];  Service: Urology;  Laterality: Bilateral;  75 MINS  . CYSTOSCOPY WITH STENT PLACEMENT Bilateral 04/07/2013   Procedure: CYSTOSCOPY WITH STENT PLACEMENT;  Surgeon: Alexis Frock, MD;  [facility];  Service: Urology;   Laterality: Bilateral;  . DX LAPAROSCOPY W/ PERITONEAL AND OMENTAL BX'S AND WASHINGS  02-16-2008  . EXCISION RIGHT BREAST MASS  02-10-2002  . EXP. LAP. EXTENSIVE ADHESIOLYSIS/ RESECTION TERMINAL ILEUM , ASCENDING AND DESCENDING COLON WITH CREATION ILEOSTOMY AND MUCOUS FISTULA  02-19-2008   PERFERATION AND ABD. CANCER--  TAKEDOWN ILEOSTOMY 08-23-2008  . HOLMIUM LASER APPLICATION Bilateral [id number]   Procedure: HOLMIUM LASER APPLICATION;  Surgeon: Alexis Frock, MD;  [facility];  Service: Urology;  Laterality: Bilateral;  . HOLMIUM LASER APPLICATION Bilateral [id number]   Procedure: HOLMIUM LASER APPLICATION;  Surgeon: Alexis Frock, MD;  [facility];  Service: Urology;  Laterality: Bilateral;  . ILEOSTOMY CLOSURE  08/2008  . TOTAL ABDOMINAL HYSTERECTOMY W/ BILATERAL SALPINGOOPHORECTOMY  1976  . TOTAL ABDOMINAL HYSTERECTOMY W/ BILATERAL SALPINGOOPHORECTOMY  1976  . VULVAR LESION REMOVAL N/A 08/22/2014   Procedure: EXCISION OF VULVAR CYST  ;  Surgeon: Lyman Speller, MD;  Location: [facility];  Service: Gynecology;  Laterality: N/A;  . WOUND DEBRIDEMENT  05/28/2012   Procedure: DEBRIDEMENT ABDOMINAL WOUND;  Surgeon: Haywood Lasso, MD;  Location: [facility];  Service: General;  Laterality: N/A;  excision chronic wound abdominal wall    Current Medications: Current Meds  Medication Sig  . amLODipine (NORVASC) 10 MG tablet Take 1 tablet (10 mg total) by mouth daily.  Marland Kitchen anastrozole (ARIMIDEX) 1 MG tablet Take 1 tablet (1 mg total) by mouth daily.  Marland Kitchen aspirin EC 81 MG tablet Take 81 mg by mouth daily.  . BELSOMRA 10 MG TABS Take 10 mg by mouth at bedtime as needed (sleep).   . Biotin 10000 MCG TABS Take 10,000 mcg by mouth daily.  . Calcium Carbonate-Vitamin D (CALTRATE 600+D PO) Take 1 tablet by mouth 2 (two) times daily.  . carvedilol (COREG) 25 MG tablet Take 1 tablet (25 mg total) by mouth 2 (two) times daily with a meal. Please keep 7/23 appointment for  additional refills thanks.  . Cholecalciferol (VITAMIN D3) 50 MCG (2000 UT) TABS Take 2,000 Units by mouth daily with lunch.   . cloNIDine (CATAPRES) 0.1 MG tablet Take 1 tablet (0.1 mg total) by mouth every 8 (eight) hours.  . hydrALAZINE (APRESOLINE) 50 MG tablet Take 50 mg by mouth 3 (three) times daily.  . isosorbide mononitrate (IMDUR) 30 MG 24 hr tablet Take 1 tablet (30 mg total) by mouth daily.  Marland Kitchen PROCTOZONE-HC 2.5 % rectal cream PLACE 1 APPLICATION RECTALLY 2 TIMES A DAY AS NEEDED FOR HEMORRHOIDS.     Allergies:   Patient has no known allergies.   Social History   Socioeconomic History  . Marital status: Married    Spouse name: Not on file  . Number of children: Not on file  . Years of education: Not on file  . Highest education level: Not on file  Occupational History  . Not on file  Tobacco Use  . Smoking status: Never Smoker  . Smokeless tobacco: Never Used  Substance and Sexual Activity  . Alcohol use: No  . Drug use:  No  . Sexual activity: Never    Partners: Male    Birth control/protection: Surgical    Comment: Hysterectomy  Other Topics Concern  . Not on file  Social History Narrative  . Not on file   Social Determinants of Health   Financial Resource Strain:   . Difficulty of Paying Living Expenses:   Food Insecurity:   . Worried About Charity fundraiser in the Last Year:   . Arboriculturist in the Last Year:   Transportation Needs: No Transportation Needs  . Lack of Transportation (Medical): No  . Lack of Transportation (Non-Medical): No  Physical Activity:   . Days of Exercise per Week:   . Minutes of Exercise per Session:   Stress:   . Feeling of Stress :   Social Connections:   . Frequency of Communication with Friends and Family:   . Frequency of Social Gatherings with Friends and Family:   . Attends Religious Services:   . Active Member of Clubs or Organizations:   . Attends Archivist Meetings:   Marland Kitchen Marital Status:       Family History: The patient's family history includes Breast cancer in her sister; Cancer in her sister; Heart disease in her mother; Hyperlipidemia in her mother.  ROS:   Please see the history of present illness.    No blood in the urine or stool all other systems reviewed and are negative.  EKGs/Labs/Other Studies Reviewed:    The following studies were reviewed today: Nuclear Stress Test 01/2020: Study Highlights    The left ventricular ejection fraction is hyperdynamic (>65%).  Nuclear stress EF: 74%.  There was no ST segment deviation noted during stress.  This is a low risk study.   Small fixed anterior wall defect in anteroapex and mid anterior wall at stress and rest. With hyperdynamic EF and normal wall motion, this may be secondary to breast attenuation artifact, however cannot exclude small area of prior infarct. No ischemia noted , as rest perfusion defects are great than stress. Overall low risk study.    CARDIAC CATH 2012: CONCLUSION:  1.  Moderate LAD disease after the second diagonal with 50% narrowing. There      is also 50% narrowing in the proximal portion of the large second      diagonal.  PDA contains 30-40% narrowing at its ostium. Irregularities      also noted in the mid RCA.  2.  Normal LV function.  3.  Widely patent renal arteries.  4.  Dyspnea likely due to the left ventricular diastolic dysfunction as      demonstrated by an elevated LV end-diastolic pressure of 27.  EKG:  EKG not repeated  Recent Labs: 06/07/2019: BUN 32; Creatinine, Ser 1.30; Potassium 4.3; Sodium 141 01/20/2020: Hemoglobin 12.3; Platelet Count 378  Recent Lipid Panel    Component Value Date/Time   CHOL  02/29/2008 0515    61        ATP III CLASSIFICATION:  <200     mg/dL   Desirable  200-239  mg/dL   Borderline High  >=240    mg/dL   High   TRIG 110 02/29/2008 0515    Physical Exam:    VS:  BP (!) 156/64   Pulse 67   Ht 5\' 7"  (1.702 m)   Wt 133 lb 6.4 oz  (60.5 kg)   LMP 10/07/1974   SpO2 97%   BMI 20.89 kg/m  Wt Readings from Last 3 Encounters:  02/15/20 133 lb 6.4 oz (60.5 kg)  01/24/20 135 lb (61.2 kg)  01/17/20 135 lb (61.2 kg)     GEN: Looks okay. No acute distress HEENT: Normal NECK: No JVD. LYMPHATICS: No lymphadenopathy CARDIAC:  RRR without murmur, gallop, or edema. VASCULAR:  Normal Pulses. No bruits. RESPIRATORY:  Clear to auscultation without rales, wheezing or rhonchi  ABDOMEN: Soft, non-tender, non-distended, No pulsatile mass, MUSCULOSKELETAL: No deformity  SKIN: Warm and dry NEUROLOGIC:  Alert and oriented x 3 PSYCHIATRIC:  Normal affect   ASSESSMENT:    1. Coronary artery disease involving native coronary artery of native heart without angina pectoris   2. Essential hypertension   3. Bilateral carotid artery stenosis   4. Mixed hyperlipidemia   5. Educated about COVID-19 virus infection    PLAN:    In order of problems listed above:  1. Increase isosorbide mononitrate to 60 mg/day.  Okay to use sublingual nitroglycerin if needed.  There is a suggestion vaguely of the possibility that palpitations are occurring at the same time with associated feeling of weakness and rubbery legs.  May need to consider continuous monitoring for a timeframe.  She feels better now since isosorbide was started.  We will see how she does on a more substantial dose.  51-month follow-up   Medication Adjustments/Labs and Tests Ordered: Current medicines are reviewed at length with the patient today.  Concerns regarding medicines are outlined above.  No orders of the defined types were placed in this encounter.  No orders of the defined types were placed in this encounter.   There are no Patient Instructions on file for this visit.   Signed, Sinclair Grooms, MD  02/15/2020 4:20 PM    [facility]

## 2020-02-15 ENCOUNTER — Encounter: Payer: Self-pay | Admitting: Interventional Cardiology

## 2020-02-15 ENCOUNTER — Ambulatory Visit (INDEPENDENT_AMBULATORY_CARE_PROVIDER_SITE_OTHER): Payer: Medicare Other | Admitting: Interventional Cardiology

## 2020-02-15 ENCOUNTER — Other Ambulatory Visit: Payer: Self-pay

## 2020-02-15 VITALS — BP 156/64 | HR 67 | Ht 67.0 in | Wt 133.4 lb

## 2020-02-15 DIAGNOSIS — I2510 Atherosclerotic heart disease of native coronary artery without angina pectoris: Secondary | ICD-10-CM

## 2020-02-15 DIAGNOSIS — I10 Essential (primary) hypertension: Secondary | ICD-10-CM | POA: Diagnosis not present

## 2020-02-15 DIAGNOSIS — I6523 Occlusion and stenosis of bilateral carotid arteries: Secondary | ICD-10-CM

## 2020-02-15 DIAGNOSIS — E782 Mixed hyperlipidemia: Secondary | ICD-10-CM | POA: Diagnosis not present

## 2020-02-15 DIAGNOSIS — Z7189 Other specified counseling: Secondary | ICD-10-CM

## 2020-02-15 MED ORDER — ISOSORBIDE MONONITRATE ER 60 MG PO TB24: 60.0000 mg | ORAL_TABLET | Freq: Every day | ORAL | 3 refills | Status: DC

## 2020-02-15 NOTE — Patient Instructions (Signed)
Medication Instructions:  1) INCREASE Imdur to 60mg  once daily  *If you need a refill on your cardiac medications before your next appointment, please call your pharmacy*   Lab Work: None If you have labs (blood work) drawn today and your tests are completely normal, you will receive your results only by: Marland Kitchen MyChart Message (if you have MyChart) OR . A paper copy in the mail If you have any lab test that is abnormal or we need to change your treatment, we will call you to review the results.   Testing/Procedures: None   Follow-Up: At [facility], you and your health needs are our priority.  As part of our continuing mission to provide you with exceptional heart care, we have created designated Provider Care Teams.  These Care Teams include your primary Cardiologist (physician) and Advanced Practice Providers (APPs -  Physician Assistants and Nurse Practitioners) who all work together to provide you with the care you need, when you need it.  We recommend signing up for the patient portal called "MyChart".  Sign up information is provided on this After Visit Summary.  MyChart is used to connect with patients for Virtual Visits (Telemedicine).  Patients are able to view lab/test results, encounter notes, upcoming appointments, etc.  Non-urgent messages can be sent to your provider as well.   To learn more about what you can do with MyChart, go to [email or web address].    Your next appointment:   3 month(s)  The format for your next appointment:   In Person  Provider:   You may see Sinclair Grooms, MD or one of the following Advanced Practice Providers on your designated Care Team:    Truitt Merle, NP  Cecilie Kicks, NP  Kathyrn Drown, NP    Other Instructions

## 2020-03-03 DIAGNOSIS — E538 Deficiency of other specified B group vitamins: Secondary | ICD-10-CM | POA: Diagnosis not present

## 2020-03-23 DIAGNOSIS — L237 Allergic contact dermatitis due to plants, except food: Secondary | ICD-10-CM | POA: Diagnosis not present

## 2020-04-14 DIAGNOSIS — M1990 Unspecified osteoarthritis, unspecified site: Secondary | ICD-10-CM | POA: Diagnosis not present

## 2020-04-14 DIAGNOSIS — E7800 Pure hypercholesterolemia, unspecified: Secondary | ICD-10-CM | POA: Diagnosis not present

## 2020-04-14 DIAGNOSIS — I10 Essential (primary) hypertension: Secondary | ICD-10-CM | POA: Diagnosis not present

## 2020-04-14 DIAGNOSIS — I2510 Atherosclerotic heart disease of native coronary artery without angina pectoris: Secondary | ICD-10-CM | POA: Diagnosis not present

## 2020-04-14 DIAGNOSIS — E785 Hyperlipidemia, unspecified: Secondary | ICD-10-CM | POA: Diagnosis not present

## 2020-04-14 DIAGNOSIS — D510 Vitamin B12 deficiency anemia due to intrinsic factor deficiency: Secondary | ICD-10-CM | POA: Diagnosis not present

## 2020-04-14 DIAGNOSIS — N1832 Chronic kidney disease, stage 3b: Secondary | ICD-10-CM | POA: Diagnosis not present

## 2020-04-14 DIAGNOSIS — C50312 Malignant neoplasm of lower-inner quadrant of left female breast: Secondary | ICD-10-CM | POA: Diagnosis not present

## 2020-04-25 DIAGNOSIS — C50412 Malignant neoplasm of upper-outer quadrant of left female breast: Secondary | ICD-10-CM | POA: Diagnosis not present

## 2020-05-02 DIAGNOSIS — R922 Inconclusive mammogram: Secondary | ICD-10-CM | POA: Diagnosis not present

## 2020-05-02 DIAGNOSIS — Z853 Personal history of malignant neoplasm of breast: Secondary | ICD-10-CM | POA: Diagnosis not present

## 2020-05-02 DIAGNOSIS — M8589 Other specified disorders of bone density and structure, multiple sites: Secondary | ICD-10-CM | POA: Diagnosis not present

## 2020-05-05 DIAGNOSIS — E538 Deficiency of other specified B group vitamins: Secondary | ICD-10-CM | POA: Diagnosis not present

## 2020-05-11 DIAGNOSIS — H524 Presbyopia: Secondary | ICD-10-CM | POA: Diagnosis not present

## 2020-05-11 DIAGNOSIS — H26493 Other secondary cataract, bilateral: Secondary | ICD-10-CM | POA: Diagnosis not present

## 2020-05-11 DIAGNOSIS — H04123 Dry eye syndrome of bilateral lacrimal glands: Secondary | ICD-10-CM | POA: Diagnosis not present

## 2020-05-11 DIAGNOSIS — H353131 Nonexudative age-related macular degeneration, bilateral, early dry stage: Secondary | ICD-10-CM | POA: Diagnosis not present

## 2020-05-11 DIAGNOSIS — H52223 Regular astigmatism, bilateral: Secondary | ICD-10-CM | POA: Diagnosis not present

## 2020-05-23 NOTE — Progress Notes (Signed)
Cardiology Office Note:    Date:  05/24/2020   ID:  [name], DOB 1934-06-26, MRN [id number]  PCP:  Leeroy Cha, MD  Cardiologist:  Sinclair Grooms, MD   Referring MD: Leeroy Cha,*   Chief Complaint  Patient presents with   Coronary Artery Disease   Hypertension    History of Present Illness:    [name] is a 86 y.o. female with a hx of moderate CAD, hypertension, hyperlipidemia, and premature atrial contractions.  Angina is improved after starting Imdur.  Stress test was low risk before Imdur was started.  She has not needed sublingual nitroglycerin.  Still complains that her legs at times feel rubbery.  Her diastolic blood pressures are relatively low.  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   Breast cancer ([place]) 2010   RIGHT   CAD (coronary artery disease)    Dysuria    Family history of breast cancer    GERD (gastroesophageal reflux disease)    HX OF   Heart murmur    History of blood transfusion    History of breast cancer ONCOLOGIST-- DR Benay Spice   DX 2010--  RIGHT BREAST DCIS  HIGH GRADE S/P LUMPECTOMY W/ SLN BX--  NO RECURRENCE   History of kidney stones    History of ovarian cancer    2009--  S/P COLON RESECTION FOR PAPILLARY SEROUS CARCINOMA, BORDERLINE OVARIAN CANCER VERSUS PRIMARY PERITONEAL CARCINOMA WITH PSAMMOMA BODIES--  NO RECURRENCE   History of skin cancer    excision basal cell   Hyperlipidemia    Hypertension    Kidney stones 7/14   Nocturia    Ovarian carcinoma ([facility])    papillary serous-low grade, recurrence found in fat necrosis during ileostomy   Renal stones 2014   Shingles 30 YRS AGO   Ureteral calculi    bilateral    Past Surgical History:  Procedure Laterality Date   APPENDECTOMY     BREAST LUMPECTOMY Right 04-13-2009   W/ SLN BX   BREAST LUMPECTOMY WITH RADIOACTIVE SEED LOCALIZATION  Left 06/11/2019   Procedure: LEFT BREAST LUMPECTOMY WITH RADIOACTIVE SEED LOCALIZATION;  Surgeon: Jovita Kussmaul, MD;  Location: [place];  Service: General;  Laterality: Left;   CARDIAC CATHETERIZATION  11-11-2005 DR Daneen Schick   MODERATE LAD DISEASE/ NORMAL LVF/ EF 65-75%   CATARACT EXTRACTION W/ INTRAOCULAR LENS IMPLANT Bilateral 04/26/2013   CHOLECYSTECTOMY  1990   OPEN   COLONOSCOPY     CYSTOSCOPY W/ URETERAL STENT PLACEMENT Bilateral 05/07/2013   Procedure: CYSTOSCOPY WITH STENT REPLACEMENTS;  Surgeon: Alexis Frock, MD;  Location: [facility];  Service: Urology;  Laterality: Bilateral;   CYSTOSCOPY WITH BIOPSY N/A 04/07/2013   Procedure: CYSTOSCOPY WITH BIOPSY and fulgeration;  Surgeon: Alexis Frock, MD;  [facility];  Service: Urology;  Laterality: N/A;   CYSTOSCOPY WITH RETROGRADE PYELOGRAM, URETEROSCOPY AND STENT PLACEMENT Bilateral 05/07/2013   Procedure: CYSTOSCOPY WITH RETROGRADE PYELOGRAM, URETEROSCOPY ;  Surgeon: Alexis Frock, MD;  Location[facility];  Service: Urology;  Laterality: Bilateral;   CYSTOSCOPY WITH RETROGRADE PYELOGRAM, URETEROSCOPY AND STENT PLACEMENT Bilateral 01/06/2019   Procedure: CYSTOSCOPY WITH RETROGRADE PYELOGRAM, URETEROSCOPY AND STENT PLACEMENT, FIRST STAGE;  Surgeon: Alexis Frock, MD;  [facility];  Service: Urology;  Laterality: Bilateral;   CYSTOSCOPY WITH RETROGRADE PYELOGRAM, URETEROSCOPY AND STENT PLACEMENT Bilateral 01/27/2019   Procedure: CYSTOSCOPY WITH RETROGRADE PYELOGRAM, URETEROSCOPY  AND STENT PLACEMENT;  Surgeon: Alexis Frock, MD;  [facility];  Service: Urology;  Laterality: Bilateral;  75 MINS   CYSTOSCOPY WITH STENT PLACEMENT Bilateral 04/07/2013   Procedure: CYSTOSCOPY WITH STENT PLACEMENT;  Surgeon: Alexis Frock, MD;  [facility];  Service: Urology;  Laterality: Bilateral;   DX LAPAROSCOPY W/ PERITONEAL AND OMENTAL BX'S AND WASHINGS  02-16-2008   EXCISION RIGHT BREAST MASS   02-10-2002   EXP. LAP. EXTENSIVE ADHESIOLYSIS/ RESECTION TERMINAL ILEUM , ASCENDING AND DESCENDING COLON WITH CREATION ILEOSTOMY AND MUCOUS FISTULA  02-19-2008   PERFERATION AND ABD. CANCER--  TAKEDOWN ILEOSTOMY 08-23-2008   HOLMIUM LASER APPLICATION Bilateral 04/07/6202   Procedure: HOLMIUM LASER APPLICATION;  Surgeon: Alexis Frock, MD;  [facility];  Service: Urology;  Laterality: Bilateral;   HOLMIUM LASER APPLICATION Bilateral 5/59/7416   Procedure: HOLMIUM LASER APPLICATION;  Surgeon: Alexis Frock, MD;  [facility];  Service: Urology;  Laterality: Bilateral;   ILEOSTOMY CLOSURE  08/2008   TOTAL ABDOMINAL HYSTERECTOMY W/ BILATERAL SALPINGOOPHORECTOMY  1976   TOTAL ABDOMINAL HYSTERECTOMY W/ BILATERAL SALPINGOOPHORECTOMY  1976   VULVAR LESION REMOVAL N/A 08/22/2014   Procedure: EXCISION OF VULVAR CYST  ;  Surgeon: Lyman Speller, MD;  Location: [facility];  Service: Gynecology;  Laterality: N/A;   WOUND DEBRIDEMENT  05/28/2012   Procedure: DEBRIDEMENT ABDOMINAL WOUND;  Surgeon: Haywood Lasso, MD;  Location: [place];  Service: General;  Laterality: N/A;  excision chronic wound abdominal wall    Current Medications: Current Meds  Medication Sig   amLODipine (NORVASC) 10 MG tablet Take 1 tablet (10 mg total) by mouth daily.   anastrozole (ARIMIDEX) 1 MG tablet Take 1 tablet (1 mg total) by mouth daily.   aspirin EC 81 MG tablet Take 81 mg by mouth daily.   BELSOMRA 10 MG TABS Take 10 mg by mouth at bedtime as needed (sleep).    Biotin 10000 MCG TABS Take 10,000 mcg by mouth daily.   Calcium Carbonate-Vitamin D (CALTRATE 600+D PO) Take 1 tablet by mouth 2 (two) times daily.   carvedilol (COREG) 25 MG tablet Take 1 tablet (25 mg total) by mouth 2 (two) times daily with a meal. Please keep 7/23 appointment for additional refills thanks.   Cholecalciferol (VITAMIN D3) 50 MCG (2000 UT) TABS Take 2,000 Units by mouth daily with lunch.     cloNIDine (CATAPRES) 0.1 MG tablet Take 1 tablet (0.1 mg total) by mouth every 8 (eight) hours.   hydrALAZINE (APRESOLINE) 50 MG tablet Take 50 mg by mouth 3 (three) times daily.   isosorbide mononitrate (IMDUR) 60 MG 24 hr tablet Take 1 tablet (60 mg total) by mouth daily.   PROCTOZONE-HC 2.5 % rectal cream PLACE 1 APPLICATION RECTALLY 2 TIMES A DAY AS NEEDED FOR HEMORRHOIDS.   [DISCONTINUED] PROCTOSOL HC 2.5 % rectal cream Apply 1 application topically as needed.     Allergies:   Patient has no known allergies.   Social History   Socioeconomic History   Marital status: Married    Spouse name: Not on file   Number of children: Not on file   Years of education: Not on file   Highest education level: Not on file  Occupational History   Not on file  Tobacco Use   Smoking status: Never Smoker   Smokeless tobacco: Never Used  Vaping Use   Vaping Use: Never used  Substance and Sexual Activity   Alcohol use: No   Drug use: No  Sexual activity: Never    Partners: Male    Birth control/protection: Surgical    Comment: Hysterectomy  Other Topics Concern   Not on file  Social History Narrative   Not on file   Social Determinants of Health   Financial Resource Strain:    Difficulty of Paying Living Expenses:   Food Insecurity:    Worried About Charity fundraiser in the Last Year:    Arboriculturist in the Last Year:   Transportation Needs:    Film/video editor (Medical):    Lack of Transportation (Non-Medical):   Physical Activity:    Days of Exercise per Week:    Minutes of Exercise per Session:   Stress:    Feeling of Stress :   Social Connections:    Frequency of Communication with Friends and Family:    Frequency of Social Gatherings with Friends and Family:    Attends Religious Services:    Active Member of Clubs or Organizations:    Attends Archivist Meetings:    Marital Status:      Family History: The patient's  family history includes Breast cancer in her sister; Cancer in her sister; Heart disease in her mother; Hyperlipidemia in her mother.  ROS:   Please see the history of present illness.    Overall feels better other than the "weak/rubbery legs".  All other systems reviewed and are negative.  EKGs/Labs/Other Studies Reviewed:    The following studies were reviewed today: No new imaging  EKG:  EKG not repeated  Recent Labs: 06/07/2019: BUN 32; Creatinine, Ser 1.30; Potassium 4.3; Sodium 141 01/20/2020: Hemoglobin 12.3; Platelet Count 378  Recent Lipid Panel    Component Value Date/Time   CHOL  02/29/2008 0515    61        ATP III CLASSIFICATION:  <200     mg/dL   Desirable  200-239  mg/dL   Borderline High  >=240    mg/dL   High   TRIG 110 02/29/2008 0515    Physical Exam:    VS:  BP (!) 150/52    Pulse 68    Ht 5\' 7"  (1.702 m)    Wt 130 lb (59 kg)    LMP 10/07/1974    SpO2 96%    BMI 20.36 kg/m     Wt Readings from Last 3 Encounters:  05/24/20 130 lb (59 kg)  02/15/20 133 lb 6.4 oz (60.5 kg)  01/24/20 135 lb (61.2 kg)     GEN: Younger than stated age in appearance. No acute distress HEENT: Normal NECK: Faint left carotid bruit.  No JVD. LYMPHATICS: No lymphadenopathy CARDIAC:  RRR without murmur, gallop, or edema. VASCULAR:  Normal Pulses. No bruits. RESPIRATORY:  Clear to auscultation without rales, wheezing or rhonchi  ABDOMEN: Soft, non-tender, non-distended, No pulsatile mass, MUSCULOSKELETAL: No deformity  SKIN: Warm and dry NEUROLOGIC:  Alert and oriented x 3 PSYCHIATRIC:  Normal affect   ASSESSMENT:    1. Coronary artery disease involving native coronary artery of native heart without angina pectoris   2. Essential hypertension   3. Bilateral carotid artery stenosis   4. Mixed hyperlipidemia   5. Educated about COVID-19 virus infection    PLAN:    In order of problems listed above:  1. Improved angina on Imdur.  Tolerating Imdur well.  Continue  preventive therapies that include aspirin, carvedilol, Norvasc. 2. Blood pressure systolic is high but diastolic is low.  States that her  legs feel rubbery and tired frequently.  Wonder if this has to do with cerebral hypoperfusion.  Decrease hydralazine to 50 mg twice daily for 2 weeks then down to 25 mg twice daily thereafter.  As long as blood pressure does not get significantly above 160 mmHg, will go with the lower intensity hydralazine and may even wean the medication.  I have the sense that her unsteadiness and weakness is related to the multiple blood pressure medications. 3. Faint left carotid bruit is heard.  Never been on therapy for hyperlipidemia and laboratory data demonstrates an LDL of 52 in June on no therapy. 4. Not being treated because of normal levels. 5. Covid vaccinated.  Looking forward to booster.  Overall, stable.  We will see if we can whittle some of the blood pressure therapies down.  This may help the sensation of leg weakness that she has when she is ambulatory.   Medication Adjustments/Labs and Tests Ordered: Current medicines are reviewed at length with the patient today.  Concerns regarding medicines are outlined above.  No orders of the defined types were placed in this encounter.  No orders of the defined types were placed in this encounter.   There are no Patient Instructions on file for this visit.   Signed, Sinclair Grooms, MD  05/24/2020 3:19 PM    [place]

## 2020-05-24 ENCOUNTER — Encounter: Payer: Self-pay | Admitting: Interventional Cardiology

## 2020-05-24 ENCOUNTER — Ambulatory Visit (INDEPENDENT_AMBULATORY_CARE_PROVIDER_SITE_OTHER): Payer: Medicare Other | Admitting: Interventional Cardiology

## 2020-05-24 ENCOUNTER — Other Ambulatory Visit: Payer: Self-pay

## 2020-05-24 VITALS — BP 150/52 | HR 68 | Ht 67.0 in | Wt 130.0 lb

## 2020-05-24 DIAGNOSIS — Z7189 Other specified counseling: Secondary | ICD-10-CM | POA: Diagnosis not present

## 2020-05-24 DIAGNOSIS — E782 Mixed hyperlipidemia: Secondary | ICD-10-CM

## 2020-05-24 DIAGNOSIS — I10 Essential (primary) hypertension: Secondary | ICD-10-CM | POA: Diagnosis not present

## 2020-05-24 DIAGNOSIS — I2510 Atherosclerotic heart disease of native coronary artery without angina pectoris: Secondary | ICD-10-CM

## 2020-05-24 DIAGNOSIS — I6523 Occlusion and stenosis of bilateral carotid arteries: Secondary | ICD-10-CM | POA: Diagnosis not present

## 2020-05-24 MED ORDER — HYDRALAZINE HCL 25 MG PO TABS: 25.0000 mg | ORAL_TABLET | Freq: Two times a day (BID) | ORAL | 3 refills | Status: DC

## 2020-05-24 NOTE — Patient Instructions (Signed)
Medication Instructions:  1) DECREASE Hydralazine to 50mg  twice daily for 2 weeks, then decrease to 25mg  twice daily  *If you need a refill on your cardiac medications before your next appointment, please call your pharmacy*   Lab Work: None If you have labs (blood work) drawn today and your tests are completely normal, you will receive your results only by: Marland Kitchen MyChart Message (if you have MyChart) OR . A paper copy in the mail If you have any lab test that is abnormal or we need to change your treatment, we will call you to review the results.   Testing/Procedures: None   Follow-Up: At [facility], you and your health needs are our priority.  As part of our continuing mission to provide you with exceptional heart care, we have created designated Provider Care Teams.  These Care Teams include your primary Cardiologist (physician) and Advanced Practice Providers (APPs -  Physician Assistants and Nurse Practitioners) who all work together to provide you with the care you need, when you need it.  We recommend signing up for the patient portal called "MyChart".  Sign up information is provided on this After Visit Summary.  MyChart is used to connect with patients for Virtual Visits (Telemedicine).  Patients are able to view lab/test results, encounter notes, upcoming appointments, etc.  Non-urgent messages can be sent to your provider as well.   To learn more about what you can do with MyChart, go to [email or web address].    Your next appointment:   6-7 month(s)  The format for your next appointment:   In Person  Provider:   You may see Sinclair Grooms, MD or one of the following Advanced Practice Providers on your designated Care Team:    Truitt Merle, NP  Cecilie Kicks, NP  Kathyrn Drown, NP    Other Instructions

## 2020-05-29 DIAGNOSIS — N1832 Chronic kidney disease, stage 3b: Secondary | ICD-10-CM | POA: Diagnosis not present

## 2020-05-29 DIAGNOSIS — C50312 Malignant neoplasm of lower-inner quadrant of left female breast: Secondary | ICD-10-CM | POA: Diagnosis not present

## 2020-05-29 DIAGNOSIS — M1990 Unspecified osteoarthritis, unspecified site: Secondary | ICD-10-CM | POA: Diagnosis not present

## 2020-05-29 DIAGNOSIS — E785 Hyperlipidemia, unspecified: Secondary | ICD-10-CM | POA: Diagnosis not present

## 2020-05-29 DIAGNOSIS — I2510 Atherosclerotic heart disease of native coronary artery without angina pectoris: Secondary | ICD-10-CM | POA: Diagnosis not present

## 2020-05-29 DIAGNOSIS — D510 Vitamin B12 deficiency anemia due to intrinsic factor deficiency: Secondary | ICD-10-CM | POA: Diagnosis not present

## 2020-05-29 DIAGNOSIS — I10 Essential (primary) hypertension: Secondary | ICD-10-CM | POA: Diagnosis not present

## 2020-05-29 DIAGNOSIS — E7800 Pure hypercholesterolemia, unspecified: Secondary | ICD-10-CM | POA: Diagnosis not present

## 2020-06-05 DIAGNOSIS — E538 Deficiency of other specified B group vitamins: Secondary | ICD-10-CM | POA: Diagnosis not present

## 2020-06-16 ENCOUNTER — Telehealth: Payer: Self-pay | Admitting: Interventional Cardiology

## 2020-06-16 NOTE — Telephone Encounter (Signed)
Pt followed instructions weaning Hydralazine down to 25mg  BID.  States she does not see a significant difference in the "rubbery" feeling in her legs.  Still has it some days and other days feels fine.  States it may be happening less often but not by much.  Definitely isn't any worse.  SBP after meds usually 150-low 160s.  Advised I will send to Dr. Tamala Julian for review and advisement.

## 2020-06-16 NOTE — Telephone Encounter (Signed)
Pt called and stated that Dr. Tamala Julian reduced her medication to see how she responds to it. She gave an update and said some days she see a difference and some days she don't. It's like a mixed bag. Wants nurse to call to eleborate.

## 2020-06-18 NOTE — Telephone Encounter (Signed)
Go back to prior dose of hydralazine.

## 2020-06-19 MED ORDER — HYDRALAZINE HCL 50 MG PO TABS: 50.0000 mg | ORAL_TABLET | Freq: Three times a day (TID) | ORAL | 3 refills | Status: DC

## 2020-06-19 NOTE — Telephone Encounter (Signed)
Spoke with pt and made her aware to go back to original dose of Hydralazine, which was 50mg  TID.  Pt verbalized understanding and was in agreement with plan.

## 2020-06-22 ENCOUNTER — Inpatient Hospital Stay (HOSPITAL_BASED_OUTPATIENT_CLINIC_OR_DEPARTMENT_OTHER): Payer: Medicare Other | Admitting: Oncology

## 2020-06-22 ENCOUNTER — Other Ambulatory Visit: Payer: Self-pay

## 2020-06-22 ENCOUNTER — Inpatient Hospital Stay: Payer: Medicare Other | Attending: Oncology

## 2020-06-22 ENCOUNTER — Telehealth: Payer: Self-pay | Admitting: Oncology

## 2020-06-22 VITALS — BP 173/57 | HR 64 | Temp 97.9°F | Resp 15 | Ht 67.0 in | Wt 129.1 lb

## 2020-06-22 DIAGNOSIS — I6523 Occlusion and stenosis of bilateral carotid arteries: Secondary | ICD-10-CM

## 2020-06-22 DIAGNOSIS — D0511 Intraductal carcinoma in situ of right breast: Secondary | ICD-10-CM | POA: Insufficient documentation

## 2020-06-22 DIAGNOSIS — Z170 Estrogen receptor positive status [ER+]: Secondary | ICD-10-CM

## 2020-06-22 DIAGNOSIS — D75839 Thrombocytosis, unspecified: Secondary | ICD-10-CM

## 2020-06-22 DIAGNOSIS — Z23 Encounter for immunization: Secondary | ICD-10-CM | POA: Insufficient documentation

## 2020-06-22 LAB — CBC WITH DIFFERENTIAL (CANCER CENTER ONLY)
Abs Immature Granulocytes: 0.09 10*3/uL — ABNORMAL HIGH (ref 0.00–0.07)
Basophils Absolute: 0.2 10*3/uL — ABNORMAL HIGH (ref 0.0–0.1)
Basophils Relative: 2 %
Eosinophils Absolute: 0.4 10*3/uL (ref 0.0–0.5)
Eosinophils Relative: 4 %
HCT: 36.9 % (ref 36.0–46.0)
Hemoglobin: 12.3 g/dL (ref 12.0–15.0)
Immature Granulocytes: 1 %
Lymphocytes Relative: 12 %
Lymphs Abs: 1.1 10*3/uL (ref 0.7–4.0)
MCH: 31.6 pg (ref 26.0–34.0)
MCHC: 33.3 g/dL (ref 30.0–36.0)
MCV: 94.9 fL (ref 80.0–100.0)
Monocytes Absolute: 0.6 10*3/uL (ref 0.1–1.0)
Monocytes Relative: 6 %
Neutro Abs: 7.1 10*3/uL (ref 1.7–7.7)
Neutrophils Relative %: 75 %
Platelet Count: 424 10*3/uL — ABNORMAL HIGH (ref 150–400)
RBC: 3.89 MIL/uL (ref 3.87–5.11)
RDW: 14.5 % (ref 11.5–15.5)
WBC Count: 9.4 10*3/uL (ref 4.0–10.5)
nRBC: 0 % (ref 0.0–0.2)

## 2020-06-22 LAB — LACTATE DEHYDROGENASE: LDH: 167 U/L (ref 98–192)

## 2020-06-22 MED ORDER — INFLUENZA VAC A&B SA ADJ QUAD 0.5 ML IM PRSY
0.5000 mL | PREFILLED_SYRINGE | Freq: Once | INTRAMUSCULAR | Status: AC
  Administered 2020-06-22: 0.5 mL via INTRAMUSCULAR

## 2020-06-22 MED ORDER — INFLUENZA VAC A&B SA ADJ QUAD 0.5 ML IM PRSY
PREFILLED_SYRINGE | INTRAMUSCULAR | Status: AC
  Filled 2020-06-22: qty 0.5

## 2020-06-22 NOTE — Progress Notes (Signed)
[facility] OFFICE PROGRESS NOTE   Diagnosis: Breast cancer  INTERVAL HISTORY:   [name] returns as scheduled.  She continues Arimidex.  No hot flashes or arthralgias.  She feels well.  She has noted a "stinging" discomfort near the left lumpectomy scar and areola for the past month.  No palpable change.  No discharge.  She had a mammogram this month (we do not have the report).  Objective:  Vital signs in last 24 hours:  Blood pressure (!) 173/57, pulse 64, temperature 97.9 F (36.6 C), temperature source Tympanic, resp. rate 15, height $RemoveBe'5\' 7"'VutORWbZt$  (1.702 m), weight 129 lb 1.6 oz (58.6 kg), last menstrual period 10/07/1974, SpO2 97 %.    HEENT: Neck without mass Lymphatics: No cervical, supraclavicular, or axillary nodes Resp: Lungs clear bilaterally Cardio: Regular rate and rhythm GI: No hepatosplenomegaly, no apparent ascites, nontender, no mass Vascular: No leg edema Breast: Bilateral breast biopsy scars.  No discrete mass in either breast.  Both axillae appear benign.  Mild inversion of the right nipple.  The left nipple is unremarkable.  Deep to the lateral aspect of the left lumpectomy scar and at the edge of the areola there is a soft round subcutaneous fullness.  No tenderness.   Lab Results:  Lab Results  Component Value Date   WBC 9.4 06/22/2020   HGB 12.3 06/22/2020   HCT 36.9 06/22/2020   MCV 94.9 06/22/2020   PLT 424 (H) 06/22/2020   NEUTROABS 7.1 06/22/2020    CMP  Lab Results  Component Value Date   NA 141 06/07/2019   K 4.3 06/07/2019   CL 110 06/07/2019   CO2 24 06/07/2019   GLUCOSE 128 (H) 06/07/2019   BUN 32 (H) 06/07/2019   CREATININE 1.30 (H) 06/07/2019   CALCIUM 9.5 06/07/2019   PROT 6.1 04/11/2009   ALBUMIN 3.3 (L) 04/11/2009   AST 22 04/11/2009   ALT 41 (H) 04/11/2009   ALKPHOS 101 04/11/2009   BILITOT 0.7 04/11/2009   GFRNONAA 38 (L) 06/07/2019   GFRAA 44 (L) 06/07/2019    Medications: I have reviewed the patient's  current medications.   Assessment/Plan: 1. 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 2. Remote hysterectomy and bilateral salpingo-oophorectomy with the cytology from [place] confirming numerous "psammoma bodies." 3. 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  a. Status post a needle localized lumpectomy and sentinel lymph node biopsy 04/13/2009 with the pathology confirming high-grade ductal carcinoma in situ with an associated 0.26 mm invasive carcinoma, ER positive, PR positive, and HER2 positive. b. Status post adjuvant right breast radiation completed 06/19/2009. c. Initiation of Arimidex 05/01/2009.  Completed 5 years in July 2015 4. Family history of breast cancer. 5. History of Clostridium difficile colitis. 6. Frequent bowel movements following the bowel resection in 2009. 7. Hypertension 8. 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 Elevated platelet count   Disposition: She appears stable.  She will continue Arimidex.  The palpable finding near  the left areola is likely benign.  She will return for repeat examination in 3 months.  She will contact us in the interim for increased pain or palpable change at the left breast.  We will follow up on the recent mammogram.  The mild elevation of the platelet count is likely benign finding.  We will continue to monitor the  platelet count.  Betsy Coder, MD  06/22/2020  3:28 PM

## 2020-06-22 NOTE — Telephone Encounter (Signed)
Scheduled appointment per 9/16 los. Patient is aware of appointment. I gave patient updated calendar print out.

## 2020-07-05 DIAGNOSIS — M1990 Unspecified osteoarthritis, unspecified site: Secondary | ICD-10-CM | POA: Diagnosis not present

## 2020-07-05 DIAGNOSIS — D510 Vitamin B12 deficiency anemia due to intrinsic factor deficiency: Secondary | ICD-10-CM | POA: Diagnosis not present

## 2020-07-05 DIAGNOSIS — E7800 Pure hypercholesterolemia, unspecified: Secondary | ICD-10-CM | POA: Diagnosis not present

## 2020-07-05 DIAGNOSIS — I2510 Atherosclerotic heart disease of native coronary artery without angina pectoris: Secondary | ICD-10-CM | POA: Diagnosis not present

## 2020-07-05 DIAGNOSIS — I10 Essential (primary) hypertension: Secondary | ICD-10-CM | POA: Diagnosis not present

## 2020-07-05 DIAGNOSIS — N1832 Chronic kidney disease, stage 3b: Secondary | ICD-10-CM | POA: Diagnosis not present

## 2020-07-05 DIAGNOSIS — E785 Hyperlipidemia, unspecified: Secondary | ICD-10-CM | POA: Diagnosis not present

## 2020-07-05 DIAGNOSIS — C50312 Malignant neoplasm of lower-inner quadrant of left female breast: Secondary | ICD-10-CM | POA: Diagnosis not present

## 2020-07-07 DIAGNOSIS — E538 Deficiency of other specified B group vitamins: Secondary | ICD-10-CM | POA: Diagnosis not present

## 2020-07-14 ENCOUNTER — Other Ambulatory Visit: Payer: Self-pay | Admitting: Oncology

## 2020-07-14 NOTE — Telephone Encounter (Signed)
For your review

## 2020-07-24 DIAGNOSIS — E559 Vitamin D deficiency, unspecified: Secondary | ICD-10-CM | POA: Diagnosis not present

## 2020-07-24 DIAGNOSIS — N189 Chronic kidney disease, unspecified: Secondary | ICD-10-CM | POA: Diagnosis not present

## 2020-07-31 DIAGNOSIS — I129 Hypertensive chronic kidney disease with stage 1 through stage 4 chronic kidney disease, or unspecified chronic kidney disease: Secondary | ICD-10-CM | POA: Diagnosis not present

## 2020-07-31 DIAGNOSIS — E559 Vitamin D deficiency, unspecified: Secondary | ICD-10-CM | POA: Diagnosis not present

## 2020-07-31 DIAGNOSIS — Z87442 Personal history of urinary calculi: Secondary | ICD-10-CM | POA: Diagnosis not present

## 2020-07-31 DIAGNOSIS — D631 Anemia in chronic kidney disease: Secondary | ICD-10-CM | POA: Diagnosis not present

## 2020-07-31 DIAGNOSIS — N1832 Chronic kidney disease, stage 3b: Secondary | ICD-10-CM | POA: Diagnosis not present

## 2020-08-07 DIAGNOSIS — E538 Deficiency of other specified B group vitamins: Secondary | ICD-10-CM | POA: Diagnosis not present

## 2020-08-15 ENCOUNTER — Encounter: Payer: Self-pay | Admitting: Oncology

## 2020-08-20 DIAGNOSIS — I2510 Atherosclerotic heart disease of native coronary artery without angina pectoris: Secondary | ICD-10-CM | POA: Diagnosis not present

## 2020-08-20 DIAGNOSIS — N1832 Chronic kidney disease, stage 3b: Secondary | ICD-10-CM | POA: Diagnosis not present

## 2020-08-20 DIAGNOSIS — E7800 Pure hypercholesterolemia, unspecified: Secondary | ICD-10-CM | POA: Diagnosis not present

## 2020-08-20 DIAGNOSIS — E785 Hyperlipidemia, unspecified: Secondary | ICD-10-CM | POA: Diagnosis not present

## 2020-08-20 DIAGNOSIS — M1990 Unspecified osteoarthritis, unspecified site: Secondary | ICD-10-CM | POA: Diagnosis not present

## 2020-08-20 DIAGNOSIS — C50312 Malignant neoplasm of lower-inner quadrant of left female breast: Secondary | ICD-10-CM | POA: Diagnosis not present

## 2020-08-20 DIAGNOSIS — I10 Essential (primary) hypertension: Secondary | ICD-10-CM | POA: Diagnosis not present

## 2020-08-20 DIAGNOSIS — D510 Vitamin B12 deficiency anemia due to intrinsic factor deficiency: Secondary | ICD-10-CM | POA: Diagnosis not present

## 2020-08-22 DIAGNOSIS — Z23 Encounter for immunization: Secondary | ICD-10-CM | POA: Diagnosis not present

## 2020-09-08 DIAGNOSIS — I10 Essential (primary) hypertension: Secondary | ICD-10-CM | POA: Diagnosis not present

## 2020-09-08 DIAGNOSIS — N1832 Chronic kidney disease, stage 3b: Secondary | ICD-10-CM | POA: Diagnosis not present

## 2020-09-08 DIAGNOSIS — E538 Deficiency of other specified B group vitamins: Secondary | ICD-10-CM | POA: Diagnosis not present

## 2020-09-08 DIAGNOSIS — I2510 Atherosclerotic heart disease of native coronary artery without angina pectoris: Secondary | ICD-10-CM | POA: Diagnosis not present

## 2020-09-25 ENCOUNTER — Telehealth: Payer: Self-pay

## 2020-09-25 ENCOUNTER — Other Ambulatory Visit: Payer: Self-pay

## 2020-09-25 ENCOUNTER — Inpatient Hospital Stay: Payer: Medicare Other | Attending: Oncology | Admitting: Oncology

## 2020-09-25 ENCOUNTER — Telehealth: Payer: Self-pay | Admitting: Oncology

## 2020-09-25 VITALS — BP 160/51 | HR 75 | Temp 97.8°F | Resp 18 | Ht 67.0 in | Wt 127.6 lb

## 2020-09-25 DIAGNOSIS — Z170 Estrogen receptor positive status [ER+]: Secondary | ICD-10-CM | POA: Diagnosis not present

## 2020-09-25 DIAGNOSIS — I6523 Occlusion and stenosis of bilateral carotid arteries: Secondary | ICD-10-CM | POA: Diagnosis not present

## 2020-09-25 DIAGNOSIS — Z923 Personal history of irradiation: Secondary | ICD-10-CM | POA: Diagnosis not present

## 2020-09-25 DIAGNOSIS — Z79811 Long term (current) use of aromatase inhibitors: Secondary | ICD-10-CM | POA: Diagnosis not present

## 2020-09-25 DIAGNOSIS — C50412 Malignant neoplasm of upper-outer quadrant of left female breast: Secondary | ICD-10-CM | POA: Diagnosis not present

## 2020-09-25 DIAGNOSIS — D0512 Intraductal carcinoma in situ of left breast: Secondary | ICD-10-CM | POA: Insufficient documentation

## 2020-09-25 NOTE — Telephone Encounter (Signed)
Called patient to make aware of need for dexa scan will call tomorrow and try to get scan an same day as [facility] in July

## 2020-09-25 NOTE — Progress Notes (Signed)
[facility] OFFICE PROGRESS NOTE   Diagnosis: Breast cancer  INTERVAL HISTORY:   [name] returns as scheduled.  She continues Arimidex.  She no longer has pain in the left breast.  She has a history of frequent bowel movements and "gas "since undergoing abdominal surgery in 2009.  She has increased arthralgias at the shoulders and wrist since starting Arimidex.  She takes Tylenol for partial relief of the discomfort.  Objective:  Vital signs in last 24 hours:  Blood pressure (!) 160/51, pulse 75, temperature 97.8 F (36.6 C), temperature source Tympanic, resp. rate 18, height $RemoveBe'5\' 7"'[id number]$  (1.702 m), weight 127 lb 9.6 oz (57.9 kg), last menstrual period 10/07/1974, SpO2 96 %.    Lymphatics: No cervical, supraclavicular, or axillary nodes Resp: Lungs clear bilaterally Cardio: Regular rate and rhythm GI: No hepatosplenomegaly, nontender, no mass Vascular: No leg edema Breast: Bilateral breast biopsies.  No mass in either breast.  Inversion of the right nipple.  No mass at the left lumpectomy scar or areola.  No subcutaneous fullness.    Lab Results:  Lab Results  Component Value Date   WBC 9.4 06/22/2020   HGB 12.3 06/22/2020   HCT 36.9 06/22/2020   MCV 94.9 06/22/2020   PLT 424 (H) 06/22/2020   NEUTROABS 7.1 06/22/2020    CMP  Lab Results  Component Value Date   NA 141 06/07/2019   K 4.3 06/07/2019   CL 110 06/07/2019   CO2 24 06/07/2019   GLUCOSE 128 (H) 06/07/2019   BUN 32 (H) 06/07/2019   CREATININE 1.30 (H) 06/07/2019   CALCIUM 9.5 06/07/2019   PROT 6.1 04/11/2009   ALBUMIN 3.3 (L) 04/11/2009   AST 22 04/11/2009   ALT 41 (H) 04/11/2009   ALKPHOS 101 04/11/2009   BILITOT 0.7 04/11/2009   GFRNONAA 38 (L) 06/07/2019   GFRAA 44 (L) 06/07/2019    No results found for: CEA1   Medications: I have reviewed the patient's current medications.   Assessment/Plan: 1. 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 2. Remote hysterectomy and bilateral salpingo-oophorectomy with the cytology from [name] confirming numerous "psammoma bodies." 3. 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  a. Status post a needle localized lumpectomy and sentinel lymph node biopsy 04/13/2009 with the pathology confirming high-grade ductal carcinoma in situ with an associated 0.26 mm invasive carcinoma, ER positive, PR positive, and HER2 positive. b. Status post adjuvant right breast radiation completed 06/19/2009. c. Initiation of Arimidex 05/01/2009.  Completed 5 years in July 2015 4. Family history of breast cancer. 5. History of Clostridium difficile colitis. 6. Frequent bowel movements following the bowel resection in 2009. 7. Hypertension 8. 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 Elevated platelet count    Disposition: [name] is in clinical remission from breast cancer.  She has been maintained on adjuvant Arimidex for the past year.  She will continue Arimidex.  She will be due for a mammogram in July 2022.  She will return for an office visit in 8 months.  She will continue Tylenol as needed for the arthralgias.  The arthralgias may be related to Arimidex.  She will contact us if Tylenol does not provide adequate relief and we will consider alternate treatment options.  [facility] Coder, MD  09/25/2020  3:32 PM

## 2020-09-25 NOTE — Telephone Encounter (Signed)
Scheduled appointment per 12/20 los. Spoke to patient who is aware of appointment date and time.  

## 2020-09-26 ENCOUNTER — Other Ambulatory Visit: Payer: Self-pay

## 2020-09-26 NOTE — Progress Notes (Signed)
Pt aware of dexa scan for august

## 2020-10-09 DIAGNOSIS — E538 Deficiency of other specified B group vitamins: Secondary | ICD-10-CM | POA: Diagnosis not present

## 2020-11-06 DIAGNOSIS — E785 Hyperlipidemia, unspecified: Secondary | ICD-10-CM | POA: Diagnosis not present

## 2020-11-06 DIAGNOSIS — M1990 Unspecified osteoarthritis, unspecified site: Secondary | ICD-10-CM | POA: Diagnosis not present

## 2020-11-06 DIAGNOSIS — C50312 Malignant neoplasm of lower-inner quadrant of left female breast: Secondary | ICD-10-CM | POA: Diagnosis not present

## 2020-11-06 DIAGNOSIS — I10 Essential (primary) hypertension: Secondary | ICD-10-CM | POA: Diagnosis not present

## 2020-11-06 DIAGNOSIS — I2510 Atherosclerotic heart disease of native coronary artery without angina pectoris: Secondary | ICD-10-CM | POA: Diagnosis not present

## 2020-11-06 DIAGNOSIS — E7800 Pure hypercholesterolemia, unspecified: Secondary | ICD-10-CM | POA: Diagnosis not present

## 2020-11-06 DIAGNOSIS — N1832 Chronic kidney disease, stage 3b: Secondary | ICD-10-CM | POA: Diagnosis not present

## 2020-11-06 DIAGNOSIS — D510 Vitamin B12 deficiency anemia due to intrinsic factor deficiency: Secondary | ICD-10-CM | POA: Diagnosis not present

## 2020-11-13 DIAGNOSIS — E538 Deficiency of other specified B group vitamins: Secondary | ICD-10-CM | POA: Diagnosis not present

## 2020-12-01 DIAGNOSIS — I2510 Atherosclerotic heart disease of native coronary artery without angina pectoris: Secondary | ICD-10-CM | POA: Diagnosis not present

## 2020-12-01 DIAGNOSIS — N1832 Chronic kidney disease, stage 3b: Secondary | ICD-10-CM | POA: Diagnosis not present

## 2020-12-01 DIAGNOSIS — I10 Essential (primary) hypertension: Secondary | ICD-10-CM | POA: Diagnosis not present

## 2020-12-01 DIAGNOSIS — E785 Hyperlipidemia, unspecified: Secondary | ICD-10-CM | POA: Diagnosis not present

## 2020-12-01 DIAGNOSIS — C50312 Malignant neoplasm of lower-inner quadrant of left female breast: Secondary | ICD-10-CM | POA: Diagnosis not present

## 2020-12-01 DIAGNOSIS — M1990 Unspecified osteoarthritis, unspecified site: Secondary | ICD-10-CM | POA: Diagnosis not present

## 2020-12-01 DIAGNOSIS — E7800 Pure hypercholesterolemia, unspecified: Secondary | ICD-10-CM | POA: Diagnosis not present

## 2020-12-01 DIAGNOSIS — D510 Vitamin B12 deficiency anemia due to intrinsic factor deficiency: Secondary | ICD-10-CM | POA: Diagnosis not present

## 2020-12-10 NOTE — Progress Notes (Unsigned)
Cardiology Office Note:    Date:  12/11/2020   ID:  [name], DOB 09/30/1934, MRN [id number]  PCP:  Leeroy Cha, MD  Cardiologist:  Sinclair Grooms, MD   Referring MD: Leeroy Cha,*   Chief Complaint  Patient presents with  . Congestive Heart Failure  . Hypertension  . Chest Pain    History of Present Illness:    [name] is a 87 y.o. female with a hx of moderate CAD, primaryhypertension, hyperlipidemia, and premature atrial contractions.  Having some tightness in the chest at random.  Not precipitated by activity.  Taking medications as prescribed.  Was having shortness of breath on exertion months ago but this is better with the medication regimen as it is currently.  Also having palpitations without lightheadedness or dizziness.  Episodes can last minutes and are very intermittent not continuous.  More noticeable when restful.  Past Medical History:  Diagnosis Date  . Arthritis    HANDS AND BACK  . 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 Breast cancer ([place]) 2010   RIGHT  . CAD (coronary artery disease)   . Dysuria   . Family history of breast cancer   . GERD (gastroesophageal reflux disease)    HX OF  . Heart murmur   . History of blood transfusion   . History of breast cancer ONCOLOGIST-- DR Benay Spice   DX 2010--  RIGHT BREAST DCIS  HIGH GRADE S/P LUMPECTOMY W/ SLN BX--  NO RECURRENCE  . History of kidney stones   . 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 History of skin cancer    excision basal cell  . Hyperlipidemia   . Hypertension   . Kidney stones 7/14  . Nocturia   . 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 Renal stones 2014  . Shingles 30 YRS AGO  . Ureteral calculi    bilateral    Past  Surgical History:  Procedure Laterality Date  . APPENDECTOMY    . BREAST LUMPECTOMY Right 04-13-2009   W/ SLN BX  . BREAST LUMPECTOMY WITH RADIOACTIVE SEED LOCALIZATION Left 06/11/2019   Procedure: LEFT BREAST LUMPECTOMY WITH RADIOACTIVE SEED LOCALIZATION;  Surgeon: Jovita Kussmaul, MD;  Location: [facility];  Service: General;  Laterality: Left;  . CARDIAC CATHETERIZATION  11-11-2005 DR Daneen Schick   MODERATE LAD DISEASE/ NORMAL LVF/ EF 65-75%  . CATARACT EXTRACTION W/ INTRAOCULAR LENS IMPLANT Bilateral 04/26/2013  . CHOLECYSTECTOMY  1990   OPEN  . COLONOSCOPY    . CYSTOSCOPY W/ URETERAL STENT PLACEMENT Bilateral 05/07/2013   Procedure: CYSTOSCOPY WITH STENT REPLACEMENTS;  Surgeon: Alexis Frock, MD;  Location: [facility];  Service: Urology;  Laterality: Bilateral;  . CYSTOSCOPY WITH BIOPSY N/A 04/07/2013   Procedure: CYSTOSCOPY WITH BIOPSY and fulgeration;  Surgeon: Alexis Frock, MD;  Location: [facility];  Service: Urology;  Laterality: N/A;  . CYSTOSCOPY WITH RETROGRADE PYELOGRAM, URETEROSCOPY AND STENT PLACEMENT Bilateral 05/07/2013   Procedure: CYSTOSCOPY WITH RETROGRADE PYELOGRAM, URETEROSCOPY ;  Surgeon: Alexis Frock, MD;  Location: [facility];  Service: Urology;  Laterality: Bilateral;  . CYSTOSCOPY WITH RETROGRADE PYELOGRAM, URETEROSCOPY AND STENT PLACEMENT Bilateral 01/06/2019   Procedure: CYSTOSCOPY WITH RETROGRADE PYELOGRAM, URETEROSCOPY AND STENT PLACEMENT, FIRST STAGE;  Surgeon: Alexis Frock, MD;  Location: [facility];  Service: Urology;  Laterality: Bilateral;  . CYSTOSCOPY WITH RETROGRADE PYELOGRAM, URETEROSCOPY AND STENT PLACEMENT Bilateral 01/27/2019   Procedure: CYSTOSCOPY WITH RETROGRADE PYELOGRAM, URETEROSCOPY AND STENT PLACEMENT;  Surgeon: Alexis Frock, MD;  Location: [facility];  Service: Urology;  Laterality: Bilateral;  75 MINS  . CYSTOSCOPY WITH STENT PLACEMENT Bilateral 04/07/2013   Procedure: CYSTOSCOPY WITH STENT PLACEMENT;  Surgeon: Alexis Frock,  MD;  Location: [facility];  Service: Urology;  Laterality: Bilateral;  . DX LAPAROSCOPY W/ PERITONEAL AND OMENTAL BX'S AND WASHINGS  02-16-2008  . EXCISION RIGHT BREAST MASS  02-10-2002  . EXP. LAP. EXTENSIVE ADHESIOLYSIS/ RESECTION TERMINAL ILEUM , ASCENDING AND DESCENDING COLON WITH CREATION ILEOSTOMY AND MUCOUS FISTULA  02-19-2008   PERFERATION AND ABD. CANCER--  TAKEDOWN ILEOSTOMY 08-23-2008  . HOLMIUM LASER APPLICATION Bilateral 02/06/9766   Procedure: HOLMIUM LASER APPLICATION;  Surgeon: Alexis Frock, MD;  [facility];  Service: Urology;  Laterality: Bilateral;  . HOLMIUM LASER APPLICATION Bilateral 3/41/9379   Procedure: HOLMIUM LASER APPLICATION;  Surgeon: Alexis Frock, MD;  [facility];  Service: Urology;  Laterality: Bilateral;  . ILEOSTOMY CLOSURE  08/2008  . TOTAL ABDOMINAL HYSTERECTOMY W/ BILATERAL SALPINGOOPHORECTOMY  1976  . TOTAL ABDOMINAL HYSTERECTOMY W/ BILATERAL SALPINGOOPHORECTOMY  1976  . VULVAR LESION REMOVAL N/A 08/22/2014   Procedure: EXCISION OF VULVAR CYST  ;  Surgeon: Lyman Speller, MD;  Location: [facility];  Service: Gynecology;  Laterality: N/A;  . WOUND DEBRIDEMENT  05/28/2012   Procedure: DEBRIDEMENT ABDOMINAL WOUND;  Surgeon: Haywood Lasso, MD;  Location: [facility];  Service: General;  Laterality: N/A;  excision chronic wound abdominal wall    Current Medications: Current Meds  Medication Sig  . amLODipine (NORVASC) 10 MG tablet Take 1 tablet (10 mg total) by mouth daily.  Marland Kitchen anastrozole (ARIMIDEX) 1 MG tablet TAKE 1 TABLET DAILY  . aspirin EC 81 MG tablet Take 81 mg by mouth daily.  . BELSOMRA 10 MG TABS Take 10 mg by mouth at bedtime as needed (sleep).   . Biotin 10000 MCG TABS Take 10,000 mcg by mouth daily.  . Calcium Carbonate-Vitamin D (CALTRATE 600+D PO) Take 1 tablet by mouth 2 (two) times daily.  . carvedilol (COREG) 25 MG tablet Take 1 tablet (25 mg total) by mouth 2 (two) times daily with a meal. Please keep 7/23  appointment for additional refills thanks.  . Cholecalciferol (VITAMIN D3) 50 MCG (2000 UT) TABS Take 2,000 Units by mouth in the morning and at bedtime.  . cloNIDine (CATAPRES) 0.1 MG tablet Take 1 tablet (0.1 mg total) by mouth every 8 (eight) hours.  . FEROSUL 325 (65 Fe) MG tablet Take 325 mg by mouth daily.  . hydrALAZINE (APRESOLINE) 50 MG tablet Take 1 tablet (50 mg total) by mouth 3 (three) times daily.  . hydrochlorothiazide (MICROZIDE) 12.5 MG capsule Take 1 capsule (12.5 mg total) by mouth every Monday, Wednesday, and Friday.  . isosorbide mononitrate (IMDUR) 60 MG 24 hr tablet Take 1 tablet (60 mg total) by mouth daily.  Marland Kitchen PROCTOZONE-HC 2.5 % rectal cream PLACE 1 APPLICATION RECTALLY 2 TIMES A DAY AS NEEDED FOR HEMORRHOIDS.  Marland Kitchen triamcinolone (KENALOG) 0.1 % Apply topically as needed.     Allergies:   Ramipril   Social History   Socioeconomic History  . Marital status: Married    Spouse name: Not on file  . Number of children: Not on file  . Years of education: Not on file  . Highest education level:  Not on file  Occupational History  . Not on file  Tobacco Use  . Smoking status: Never Smoker  . Smokeless tobacco: Never Used  Vaping Use  . Vaping Use: Never used  Substance and Sexual Activity  . Alcohol use: No  . Drug use: No  . Sexual activity: Never    Partners: Male    Birth control/protection: Surgical    Comment: Hysterectomy  Other Topics Concern  . Not on file  Social History Narrative  . Not on file   Social Determinants of Health   Financial Resource Strain: Not on file  Food Insecurity: Not on file  Transportation Needs: Not on file  Physical Activity: Not on file  Stress: Not on file  Social Connections: Not on file     Family History: The patient's family history includes Breast cancer in her sister; Cancer in her sister; Heart disease in her mother; Hyperlipidemia in her mother.  ROS:   Please see the history of present illness.    Under  stress.  Takes care of her husband who is very dependent.  He is having difficulty with ambulation due to hip and knee discomfort.  Has upcoming hip surgery for which we have cleared him.  All other systems reviewed and are negative.  EKGs/Labs/Other Studies Reviewed:    The following studies were reviewed today: No new imaging data  EKG:  EKG normal sinus rhythm, nonspecific ST abnormality, poor R wave progression V1 through V 3.  Incomplete right bundle.  There is no change compared to prior.  Recent Labs: 06/22/2020: Hemoglobin 12.3; Platelet Count 424  Recent Lipid Panel    Component Value Date/Time   CHOL  02/29/2008 0515    61        ATP III CLASSIFICATION:  <200     mg/dL   Desirable  200-239  mg/dL   Borderline High  >=240    mg/dL   High   TRIG 110 02/29/2008 0515    Physical Exam:    VS:  BP (!) 162/64   Pulse 69   Ht 5\' 6"  (1.676 m)   Wt 129 lb 12.8 oz (58.9 kg)   LMP 10/07/1974   SpO2 96%   BMI 20.95 kg/m     Wt Readings from Last 3 Encounters:  12/11/20 129 lb 12.8 oz (58.9 kg)  09/25/20 127 lb 9.6 oz (57.9 kg)  06/22/20 129 lb 1.6 oz (58.6 kg)    Blood pressures repeated by me in both arms revealed systolic blood pressure 409 mmHg with diastolic blood pressure less than 70 in both arms. GEN: Slender/losing weight. No acute distress HEENT: Normal NECK: No JVD. LYMPHATICS: No lymphadenopathy CARDIAC: Upper sternal 2/6 systolic murmur. RRR no gallop, or edema. VASCULAR:  Normal Pulses. No bruits. RESPIRATORY:  Clear to auscultation without rales, wheezing or rhonchi  ABDOMEN: Soft, non-tender, non-distended, No pulsatile mass, MUSCULOSKELETAL: No deformity  SKIN: Warm and dry NEUROLOGIC:  Alert and oriented x 3 PSYCHIATRIC:  Normal affect   ASSESSMENT:    1. Coronary artery disease involving native coronary artery of native heart without angina pectoris   2. Essential hypertension   3. Bilateral carotid artery stenosis   4. Mixed hyperlipidemia    5. Educated about COVID-19 virus infection    PLAN:    In order of problems listed above:  1. She is not taking isosorbide as indicated on her med list.  We will try to get better blood pressure control and see if the  symptoms of chest tightness resolved. 2. Not on diuretic therapy.  Add HCTZ 12.5 mg Monday, Wednesday, and Friday.  Check basic metabolic panel in 7 days.  Blood pressure follow-up in clinic in 4 weeks.  She will continue to monitor her pressures at home and if inadequately controlled, increase HCTZ to daily if she appears to be tolerating from the standpoint of kidney function.  Goal will be to simplify her medication regimen if possible. 3. Did not discuss  4. Continue to monitor 5. Not discussed  Target BP: <130/80 mmHg  Diet and lifestyle measures for BP control were reviewed in detail: Low sodium diet (<2.5 gm daily); alcohol restriction (<3 ounces per day); weight loss (Mediterranean); avoid non-steroidal agents; > 6 hours sleep per day; 150 min moderate exercise per week. Medical regimen will include at least 2 agents. Resistant hypertension if not controlled on 3 agents. Consider further evaluation: Sleep study to r/o OSA; Renal angiogram; Primary hyperaldonism and Pheochromocytoma w/u. After 3 agents, consider MRA (spironolactone)/ Epleronone), hydralazine, beta-blocker, and Minoxidil if not already in use due to patient profile.    Medication Adjustments/Labs and Tests Ordered: Current medicines are reviewed at length with the patient today.  Concerns regarding medicines are outlined above.  Orders Placed This Encounter  Procedures  . Basic metabolic panel  . EKG 12-Lead   Meds ordered this encounter  Medications  . hydrochlorothiazide (MICROZIDE) 12.5 MG capsule    Sig: Take 1 capsule (12.5 mg total) by mouth every Monday, Wednesday, and Friday.    Dispense:  45 capsule    Refill:  3    Patient Instructions  Medication Instructions:  1) START  Hydrochlorothiazide 12.5mg  once daily on Monday, Wednesday and Friday.  *If you need a refill on your cardiac medications before your next appointment, please call your pharmacy*   Lab Work: BMET in 1 week  If you have labs (blood work) drawn today and your tests are completely normal, you will receive your results only by: Marland Kitchen MyChart Message (if you have MyChart) OR . A paper copy in the mail If you have any lab test that is abnormal or we need to change your treatment, we will call you to review the results.   Testing/Procedures: None   Follow-Up:  Your physician recommends that you schedule a follow-up appointment in: 1 month with the Hypertension Clinic.   At [facility], you and your health needs are our priority.  As part of our continuing mission to provide you with exceptional heart care, we have created designated Provider Care Teams.  These Care Teams include your primary Cardiologist (physician) and Advanced Practice Providers (APPs -  Physician Assistants and Nurse Practitioners) who all work together to provide you with the care you need, when you need it.  We recommend signing up for the patient portal called "MyChart".  Sign up information is provided on this After Visit Summary.  MyChart is used to connect with patients for Virtual Visits (Telemedicine).  Patients are able to view lab/test results, encounter notes, upcoming appointments, etc.  Non-urgent messages can be sent to your provider as well.   To learn more about what you can do with MyChart, go to [email or web address].    Your next appointment:   1 year(s)  The format for your next appointment:   In Person  Provider:   You may see Sinclair Grooms, MD or one of the following Advanced Practice Providers on your designated Care Team:    Kathyrn Drown,  NP    Other Instructions      Signed, Sinclair Grooms, MD  12/11/2020 2:38 PM    [facility]

## 2020-12-11 ENCOUNTER — Other Ambulatory Visit: Payer: Self-pay

## 2020-12-11 ENCOUNTER — Ambulatory Visit (INDEPENDENT_AMBULATORY_CARE_PROVIDER_SITE_OTHER): Payer: Medicare Other | Admitting: Interventional Cardiology

## 2020-12-11 ENCOUNTER — Encounter: Payer: Self-pay | Admitting: Interventional Cardiology

## 2020-12-11 VITALS — BP 162/64 | HR 69 | Ht 66.0 in | Wt 129.8 lb

## 2020-12-11 DIAGNOSIS — I6523 Occlusion and stenosis of bilateral carotid arteries: Secondary | ICD-10-CM | POA: Diagnosis not present

## 2020-12-11 DIAGNOSIS — E782 Mixed hyperlipidemia: Secondary | ICD-10-CM

## 2020-12-11 DIAGNOSIS — I2510 Atherosclerotic heart disease of native coronary artery without angina pectoris: Secondary | ICD-10-CM | POA: Diagnosis not present

## 2020-12-11 DIAGNOSIS — E538 Deficiency of other specified B group vitamins: Secondary | ICD-10-CM | POA: Diagnosis not present

## 2020-12-11 DIAGNOSIS — I10 Essential (primary) hypertension: Secondary | ICD-10-CM | POA: Diagnosis not present

## 2020-12-11 DIAGNOSIS — Z7189 Other specified counseling: Secondary | ICD-10-CM | POA: Diagnosis not present

## 2020-12-11 MED ORDER — HYDROCHLOROTHIAZIDE 12.5 MG PO CAPS: 12.5000 mg | ORAL_CAPSULE | ORAL | 3 refills | Status: DC

## 2020-12-11 NOTE — Patient Instructions (Signed)
Medication Instructions:  1) START Hydrochlorothiazide 12.5mg  once daily on Monday, Wednesday and Friday.  *If you need a refill on your cardiac medications before your next appointment, please call your pharmacy*   Lab Work: BMET in 1 week  If you have labs (blood work) drawn today and your tests are completely normal, you will receive your results only by: Marland Kitchen MyChart Message (if you have MyChart) OR . A paper copy in the mail If you have any lab test that is abnormal or we need to change your treatment, we will call you to review the results.   Testing/Procedures: None   Follow-Up:  Your physician recommends that you schedule a follow-up appointment in: 1 month with the Hypertension Clinic.   At [facility], you and your health needs are our priority.  As part of our continuing mission to provide you with exceptional heart care, we have created designated Provider Care Teams.  These Care Teams include your primary Cardiologist (physician) and Advanced Practice Providers (APPs -  Physician Assistants and Nurse Practitioners) who all work together to provide you with the care you need, when you need it.  We recommend signing up for the patient portal called "MyChart".  Sign up information is provided on this After Visit Summary.  MyChart is used to connect with patients for Virtual Visits (Telemedicine).  Patients are able to view lab/test results, encounter notes, upcoming appointments, etc.  Non-urgent messages can be sent to your provider as well.   To learn more about what you can do with MyChart, go to [email or web address].    Your next appointment:   1 year(s)  The format for your next appointment:   In Person  Provider:   You may see Sinclair Grooms, MD or one of the following Advanced Practice Providers on your designated Care Team:    Kathyrn Drown, NP    Other Instructions

## 2020-12-12 ENCOUNTER — Telehealth: Payer: Self-pay | Admitting: Interventional Cardiology

## 2020-12-12 NOTE — Telephone Encounter (Addendum)
Spoke with Dr. Tamala Julian and made him aware.  He said to continue Imdur and add HCTZ as instructed yesterday.  Called and spoke with pt's husband (DPR on file).  Went over recommendations and advised to have pt call back if any questions.

## 2020-12-12 NOTE — Telephone Encounter (Signed)
Pt c/o medication issue:  1. Name of Medication: isosorbide mononitrate (IMDUR) 60 MG 24 hr tablet  2. How are you currently taking this medication (dosage and times per day)? 1 tablet by mouth daily   3. Are you having a reaction (difficulty breathing--STAT)? No   4. What is your medication issue? [name] is calling stating she is taking this medication. She was unsure of this at her appointment and wanted to callback to confirm. Heba will be away today until 4:00 Pm, so she will be unable to answer a callback until then. Please advise.

## 2020-12-18 ENCOUNTER — Other Ambulatory Visit: Payer: Medicare Other

## 2020-12-18 ENCOUNTER — Other Ambulatory Visit: Payer: Self-pay

## 2020-12-18 DIAGNOSIS — I10 Essential (primary) hypertension: Secondary | ICD-10-CM

## 2020-12-18 DIAGNOSIS — I2510 Atherosclerotic heart disease of native coronary artery without angina pectoris: Secondary | ICD-10-CM | POA: Diagnosis not present

## 2020-12-19 LAB — BASIC METABOLIC PANEL
BUN/Creatinine Ratio: 27 (ref 12–28)
BUN: 33 mg/dL — ABNORMAL HIGH (ref 8–27)
CO2: 20 mmol/L (ref 20–29)
Calcium: 9.4 mg/dL (ref 8.7–10.3)
Chloride: 109 mmol/L — ABNORMAL HIGH (ref 96–106)
Creatinine, Ser: 1.21 mg/dL — ABNORMAL HIGH (ref 0.57–1.00)
Glucose: 120 mg/dL — ABNORMAL HIGH (ref 65–99)
Potassium: 4.4 mmol/L (ref 3.5–5.2)
Sodium: 142 mmol/L (ref 134–144)
eGFR: 44 mL/min/{1.73_m2} — ABNORMAL LOW (ref >59)

## 2021-01-10 DIAGNOSIS — D510 Vitamin B12 deficiency anemia due to intrinsic factor deficiency: Secondary | ICD-10-CM | POA: Diagnosis not present

## 2021-01-10 DIAGNOSIS — E7800 Pure hypercholesterolemia, unspecified: Secondary | ICD-10-CM | POA: Diagnosis not present

## 2021-01-10 DIAGNOSIS — N1832 Chronic kidney disease, stage 3b: Secondary | ICD-10-CM | POA: Diagnosis not present

## 2021-01-10 DIAGNOSIS — C50312 Malignant neoplasm of lower-inner quadrant of left female breast: Secondary | ICD-10-CM | POA: Diagnosis not present

## 2021-01-10 DIAGNOSIS — I2510 Atherosclerotic heart disease of native coronary artery without angina pectoris: Secondary | ICD-10-CM | POA: Diagnosis not present

## 2021-01-10 DIAGNOSIS — M1990 Unspecified osteoarthritis, unspecified site: Secondary | ICD-10-CM | POA: Diagnosis not present

## 2021-01-10 DIAGNOSIS — E785 Hyperlipidemia, unspecified: Secondary | ICD-10-CM | POA: Diagnosis not present

## 2021-01-10 DIAGNOSIS — I10 Essential (primary) hypertension: Secondary | ICD-10-CM | POA: Diagnosis not present

## 2021-01-15 ENCOUNTER — Ambulatory Visit: Payer: Medicare Other

## 2021-01-22 DIAGNOSIS — E538 Deficiency of other specified B group vitamins: Secondary | ICD-10-CM | POA: Diagnosis not present

## 2021-01-31 ENCOUNTER — Ambulatory Visit (INDEPENDENT_AMBULATORY_CARE_PROVIDER_SITE_OTHER): Payer: Medicare Other | Admitting: Pharmacist

## 2021-01-31 ENCOUNTER — Other Ambulatory Visit: Payer: Self-pay

## 2021-01-31 VITALS — BP 142/54 | HR 70

## 2021-01-31 DIAGNOSIS — I10 Essential (primary) hypertension: Secondary | ICD-10-CM | POA: Diagnosis not present

## 2021-01-31 DIAGNOSIS — I6523 Occlusion and stenosis of bilateral carotid arteries: Secondary | ICD-10-CM | POA: Diagnosis not present

## 2021-01-31 NOTE — Progress Notes (Signed)
Patient ID: [name]                 DOB: June 25, 1934                      MRN: [id number]     HPI: [name] is a 87 y.o. female referred by Dr. Tamala Julian to HTN clinic. PMH is significant for moderate CAD, HTN, HLD, and PACs. At last visit with Dr Tamala Julian on 3/7, pt reported some tightness in his chest and palpitations; BP was elevated at 162/64 and she was started on HCTZ 12.5mg  MWF.  Pt presents today in good spirits. Reports tolerating her medications well. Has very good medication recall. Denies dizziness, falls, headaches, or blurred vision. Occasionally feels weakness in her legs. Doesn't recall adverse event with ramipril. Took valsartan in the past but stopped when it was tainted with a carcinogen. Has been taking clonidine BID rather than TID as our med list states. Checks her BP occasionally using Omron cuff she's had for 4-5 years, recalls reading of 140/60. Has previously brought her cuff to her MD and it calibrated well. Checks her BP around 8am, morning meds taken around 7:30am. Does take her hydralazine at 7:30am, 11:30am, and 6pm. Wakes up around 7am and goes to bed around 11pm-midnight. Takes Tylenol if needed, uses Aleve 1-2x per month. Pt reports dealing with GI issues secondary to surgery in 2009 that removed half of her small and large intestine. Unfortunately her husband passed away earlier this month on April 6 one week after he had surgery.  Current HTN meds:  amlodipine 10mg  daily - lunch carvedilol 25mg  BID - 7:30am, 6pm HCTZ 12.5mg  MWF - 7:30am clonidine 0.1mg  BID -  7:30am, 6pm hydralazine 50mg  TID - 7:30am, 11:30am, 6pm Imdur 60mg  daily - 7:30am  Previously tried: ramipril - unsure of reaction  BP goal: <130/30mmHg  Family History: Breast cancer in her sister; Cancer in her sister; Heart disease in her mother; Hyperlipidemia in her mother.  Social History: Denies tobacco, alcohol, and illicit drug use  Diet: Avoids veggies - irritates her GI tract.  Likes toast and fruit for breakfast, sandwich for lunch, dinner - meat (chicken, beef, pork). Likes iced tea. Uses sodium free salt substitute, unsalted butter, avoids chips.  Ex[facility]: [facility] [employer] [facility] 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  Labs: 12/18/20: SCr 1.21, K 4.4, Na 142  Wt Readings from Last 3 Encounters:  12/11/20 129 lb 12.8 oz (58.9 kg)  09/25/20 127 lb 9.6 oz (57.9 kg)  06/22/20 129 lb 1.6 oz (58.6 kg)   BP Readings from Last 3 Encounters:  12/11/20 (!) 162/64  09/25/20 (!) 160/51  06/22/20 (!) 173/57   Pulse Readings from Last 3 Encounters:  12/11/20 69  09/25/20 75  06/22/20 64    Renal function: CrCl cannot be calculated (Patient's most recent lab result is older than the maximum 21 days allowed.).  Past Medical History:  Diagnosis Date  . Arthritis    HANDS AND BACK  . 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 Breast cancer ([place]) 2010   RIGHT  . CAD (coronary artery disease)   . Dysuria   . Family history of breast cancer   . GERD (gastroesophageal reflux disease)    HX OF  . Heart murmur   . History  of blood transfusion   . History of breast cancer ONCOLOGIST-- DR Benay Spice   DX 2010--  RIGHT BREAST DCIS  HIGH GRADE S/P LUMPECTOMY W/ SLN BX--  NO RECURRENCE  . History of kidney stones   . 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 History of skin cancer    excision basal cell  . Hyperlipidemia   . Hypertension   . Kidney stones 7/14  . Nocturia   . 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 Renal stones 2014  . Shingles 30 YRS AGO  . Ureteral calculi    bilateral    Current Outpatient  Medications on File Prior to Visit  Medication Sig Dispense Refill  . amLODipine (NORVASC) 10 MG tablet Take 1 tablet (10 mg total) by mouth daily. 90 tablet 1  . anastrozole (ARIMIDEX) 1 MG tablet TAKE 1 TABLET DAILY 90 tablet 3  . aspirin EC 81 MG tablet Take 81 mg by mouth daily.    . BELSOMRA 10 MG TABS Take 10 mg by mouth at bedtime as needed (sleep).     . Biotin 10000 MCG TABS Take 10,000 mcg by mouth daily.    . Calcium Carbonate-Vitamin D (CALTRATE 600+D PO) Take 1 tablet by mouth 2 (two) times daily.    . carvedilol (COREG) 25 MG tablet Take 1 tablet (25 mg total) by mouth 2 (two) times daily with a meal. Please keep 7/23 appointment for additional refills thanks. 180 tablet 0  . Cholecalciferol (VITAMIN D3) 50 MCG (2000 UT) TABS Take 2,000 Units by mouth in the morning and at bedtime.    . cloNIDine (CATAPRES) 0.1 MG tablet Take 1 tablet (0.1 mg total) by mouth every 8 (eight) hours. 270 tablet 3  . FEROSUL 325 (65 Fe) MG tablet Take 325 mg by mouth daily.    . hydrALAZINE (APRESOLINE) 50 MG tablet Take 1 tablet (50 mg total) by mouth 3 (three) times daily. 270 tablet 3  . hydrochlorothiazide (MICROZIDE) 12.5 MG capsule Take 1 capsule (12.5 mg total) by mouth every Monday, Wednesday, and Friday. 45 capsule 3  . isosorbide mononitrate (IMDUR) 60 MG 24 hr tablet Take 1 tablet (60 mg total) by mouth daily. 90 tablet 3  . PROCTOZONE-HC 2.5 % rectal cream PLACE 1 APPLICATION RECTALLY 2 TIMES A DAY AS NEEDED FOR HEMORRHOIDS. 60 g 11  . triamcinolone (KENALOG) 0.1 % Apply topically as needed.     No current facility-administered medications on file prior to visit.    Allergies  Allergen Reactions  . Ramipril Other (See Comments)     Assessment/Plan:  1. Hypertension - BP much improved today but remains elevated above goal <130/76mmHg. Checking BMET today with recent HCTZ start. Depending on lab results, may either increase frequency of HCTZ to daily or increase her hydralazine dose  with plans to taper off clonidine. Did advise pt to shift timing of her hydralazine doses to 7:30am, 3pm, and 11pm to better optimize timing. Will call pt tomorrow to confirm med plan and schedule follow up.  Aren Cherne E. Revia Nghiem, PharmD, BCACP, [place] [address], [place], [place] [address] Phone: [phone]; Fax: [phone] 01/31/2021 4:17 PM

## 2021-01-31 NOTE — Patient Instructions (Addendum)
Move your hydralazine 50mg  dosing to 7:30am, 3pm, and 11pm  Continue taking your other medications for now  [facility] check lab work today and I'll call you tomorrow with results. We may increase your HCTZ (hydrochlorothiadize) to be every day, or we may increase your hydralazine dose. Ideally I'd like to wean you off the clonidine as well  Limit your daily salt intake to < 2,000mg  daily  Try to increase your walking

## 2021-02-01 ENCOUNTER — Telehealth: Payer: Self-pay | Admitting: Pharmacist

## 2021-02-01 DIAGNOSIS — R079 Chest pain, unspecified: Secondary | ICD-10-CM

## 2021-02-01 DIAGNOSIS — R0602 Shortness of breath: Secondary | ICD-10-CM

## 2021-02-01 LAB — BASIC METABOLIC PANEL
BUN/Creatinine Ratio: 21 (ref 12–28)
BUN: 30 mg/dL — ABNORMAL HIGH (ref 8–27)
CO2: 22 mmol/L (ref 20–29)
Calcium: 9.1 mg/dL (ref 8.7–10.3)
Chloride: 110 mmol/L — ABNORMAL HIGH (ref 96–106)
Creatinine, Ser: 1.45 mg/dL — ABNORMAL HIGH (ref 0.57–1.00)
Glucose: 126 mg/dL — ABNORMAL HIGH (ref 65–99)
Potassium: 4.1 mmol/L (ref 3.5–5.2)
Sodium: 146 mmol/L — ABNORMAL HIGH (ref 134–144)
eGFR: 36 mL/min/{1.73_m2} — ABNORMAL LOW (ref >59)

## 2021-02-01 MED ORDER — HYDRALAZINE HCL 100 MG PO TABS: 100.0000 mg | ORAL_TABLET | Freq: Three times a day (TID) | ORAL | 3 refills | Status: DC

## 2021-02-01 NOTE — Telephone Encounter (Signed)
SCr has bumped slightly with addition of low dose HCTZ on MWF but is overall stable compared to her baseline. BP remained elevated at visit yesterday although overall improved; will increase hydralazine to 100mg  TID and decrease clonidine to 0.1mg  once daily in hopes of weaning her off of clonidine. Will continue other meds and call pt in 2 weeks to follow up with home BP readings. She is in agreement with tx plan.

## 2021-02-08 DIAGNOSIS — E559 Vitamin D deficiency, unspecified: Secondary | ICD-10-CM | POA: Diagnosis not present

## 2021-02-08 DIAGNOSIS — K580 Irritable bowel syndrome with diarrhea: Secondary | ICD-10-CM | POA: Diagnosis not present

## 2021-02-08 DIAGNOSIS — I129 Hypertensive chronic kidney disease with stage 1 through stage 4 chronic kidney disease, or unspecified chronic kidney disease: Secondary | ICD-10-CM | POA: Diagnosis not present

## 2021-02-08 DIAGNOSIS — N1832 Chronic kidney disease, stage 3b: Secondary | ICD-10-CM | POA: Diagnosis not present

## 2021-02-08 DIAGNOSIS — Z87442 Personal history of urinary calculi: Secondary | ICD-10-CM | POA: Diagnosis not present

## 2021-02-08 DIAGNOSIS — D631 Anemia in chronic kidney disease: Secondary | ICD-10-CM | POA: Diagnosis not present

## 2021-02-15 ENCOUNTER — Telehealth: Payer: Self-pay | Admitting: Pharmacist

## 2021-02-15 MED ORDER — CHLORTHALIDONE 25 MG PO TABS: 12.5000 mg | ORAL_TABLET | Freq: Every day | ORAL | 11 refills | Status: DC

## 2021-02-15 NOTE — Telephone Encounter (Signed)
Called pt to follow up with BP readings since increasing hydralazine to 100mg  TID and decreasing clonidine to 0.1mg  daily. Reports home readings about the same, 140-150s/60s. Will stop clonidine and change HCTZ 12.5mg  MWF to chlorthalidone 12.5mg  daily for more effective BP lowering. Scheduled f/u in office in 3 weeks, 2 weeks after starting chlorthalidone (pt will use up last week of HCTZ and take 12.5mg  daily before changing to chlorthalidone) for BP check and BMET. Pt has CKD so chlorthalidone will be more effective for BP lowering. She is aware of treatment plan and to continue her other HTN medications.

## 2021-02-23 DIAGNOSIS — E538 Deficiency of other specified B group vitamins: Secondary | ICD-10-CM | POA: Diagnosis not present

## 2021-02-26 ENCOUNTER — Ambulatory Visit
Admission: RE | Admit: 2021-02-26 | Discharge: 2021-02-26 | Disposition: A | Discharge status: Home / Self Care | Payer: Medicare Other | Source: Ambulatory Visit | Attending: Internal Medicine | Admitting: Internal Medicine

## 2021-02-26 ENCOUNTER — Other Ambulatory Visit: Payer: Self-pay | Admitting: Internal Medicine

## 2021-02-26 DIAGNOSIS — M25512 Pain in left shoulder: Secondary | ICD-10-CM | POA: Diagnosis not present

## 2021-02-26 DIAGNOSIS — M170 Bilateral primary osteoarthritis of knee: Secondary | ICD-10-CM

## 2021-02-26 DIAGNOSIS — M1990 Unspecified osteoarthritis, unspecified site: Secondary | ICD-10-CM | POA: Diagnosis not present

## 2021-02-26 DIAGNOSIS — M19012 Primary osteoarthritis, left shoulder: Secondary | ICD-10-CM | POA: Diagnosis not present

## 2021-02-26 DIAGNOSIS — M25511 Pain in right shoulder: Secondary | ICD-10-CM | POA: Diagnosis not present

## 2021-02-26 DIAGNOSIS — M25562 Pain in left knee: Secondary | ICD-10-CM | POA: Diagnosis not present

## 2021-02-26 DIAGNOSIS — M25462 Effusion, left knee: Secondary | ICD-10-CM | POA: Diagnosis not present

## 2021-02-26 DIAGNOSIS — N1832 Chronic kidney disease, stage 3b: Secondary | ICD-10-CM | POA: Diagnosis not present

## 2021-02-26 DIAGNOSIS — M25561 Pain in right knee: Secondary | ICD-10-CM | POA: Diagnosis not present

## 2021-02-26 DIAGNOSIS — M1712 Unilateral primary osteoarthritis, left knee: Secondary | ICD-10-CM | POA: Diagnosis not present

## 2021-02-26 DIAGNOSIS — G8929 Other chronic pain: Secondary | ICD-10-CM | POA: Diagnosis not present

## 2021-02-27 ENCOUNTER — Other Ambulatory Visit: Payer: Self-pay | Admitting: Interventional Cardiology

## 2021-03-12 ENCOUNTER — Ambulatory Visit (INDEPENDENT_AMBULATORY_CARE_PROVIDER_SITE_OTHER): Payer: Medicare Other | Admitting: Pharmacist

## 2021-03-12 ENCOUNTER — Other Ambulatory Visit: Payer: Self-pay

## 2021-03-12 VITALS — BP 138/54 | HR 74

## 2021-03-12 DIAGNOSIS — I10 Essential (primary) hypertension: Secondary | ICD-10-CM

## 2021-03-12 DIAGNOSIS — I6523 Occlusion and stenosis of bilateral carotid arteries: Secondary | ICD-10-CM

## 2021-03-12 NOTE — Patient Instructions (Addendum)
It was nice to see you today  Move your amlodipine and isosorbide to evening dosing  We'll check your kidney function and electrolytes today, and I'll send a message to Dr Tamala Julian about your continued chest pressure

## 2021-03-12 NOTE — Progress Notes (Signed)
Patient ID: [name]                 DOB: 1934-04-10                      MRN: [id number]     HPI: [name] is a 87 y.o. female referred by Dr. Tamala Julian to HTN clinic. PMH is significant for moderate CAD, HTN, HLD, and PACs. Stress test 01/2020 was low risk, EF was 74%. She was started on Imdur due to DOE and chest pain; prior heart cath in 2007 showed 50-60% LAD narrowing, 30-40% ostial PDA narrowing. At last visit with Dr Tamala Julian on 3/7, pt reported some tightness in her chest and palpitations; BP was elevated at 162/64 and she was started on HCTZ 12.5mg  MWF. At last visit with me on 4/27, BP improved to 142/54. Her hydralazine was increased to 100mg  TID, medication administration times were adjusted to optimize efficacy, and clonidine was decreased to 0.1mg  daily. On follow up call 2 weeks later, BP remained 140-150s/60s. Clonidine was stopped and HCTZ 12.5mg  MWF was changed to chlorthalidone 12.5mg  daily. She presents today for follow up.  Pt presents today in good spirits. Reports tolerating her medications well. Biggest complaints are ongoing chest pressure and weakness in her legs accompanied by fatigue. These have been ongoing for the past 1-2 years. Mentioned some tightness in her chest at last cardiology visit, thought to be secondary to higher BP readings and pt not taking Imdur as prescribed. She has been taking it daily since then. Notices pressure more at rest. Feels a bit fatigued in the AM after taking her medications. Doesn't feel like she has her normal level of energy until 4-5pm.  Has very good medication recall. Checks her BP occasionally using Omron cuff she's had for 4-5 years, readings usually 150-160 earlier in the day and then improve later on in the afternoon, recalls a reading 123/60. Has previously brought her cuff to her MD and it calibrated well. Took her 2nd dose of hydralazine earlier today because of her visit (about 1:40pm, usually takes at 3pm). Takes Tylenol if  needed, uses Aleve 1-2x per month. Has been dealing with GI issues secondary to surgery in 2009 that removed half of her small and large intestine. Unfortunately her husband passed away recently Jan 21, 2023 one week after he had surgery.  Current HTN meds:  amlodipine 10mg  daily - lunch carvedilol 25mg  BID - 7:30am, 6pm chlorthalidone 12.5mg  daily - 7:30am hydralazine 100mg  TID - 7am, 3pm, 11pm Imdur 60mg  daily - 7:30am  Previously tried: ramipril - unsure of reaction; clonidine 0.1mg  BID - tapered off in favor of more effective/better tolerated BP meds  BP goal: <130/58mmHg  Family History: Breast cancer in her sister; Cancer in her sister; Heart disease in her mother; Hyperlipidemia in her mother.  Social History: Denies tobacco, alcohol, and illicit drug use  Diet: Avoids veggies - irritates her GI tract. Likes toast and fruit for breakfast, sandwich for lunch, dinner - meat (chicken, beef, pork). Likes iced tea. Uses sodium free salt substitute, unsalted butter, avoids chips.  Ex[facility]: [facility] [employer] [facility]. 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  Labs: 12/18/20: SCr 1.21, K 4.4, Na 142  Wt Readings from Last 3 Encounters:  12/11/20 129 lb 12.8 oz (58.9 kg)  09/25/20 127 lb  9.6 oz (57.9 kg)  06/22/20 129 lb 1.6 oz (58.6 kg)   BP Readings from Last 3 Encounters:  01/31/21 (!) 142/54  12/11/20 (!) 162/64  09/25/20 (!) 160/51   Pulse Readings from Last 3 Encounters:  01/31/21 70  12/11/20 69  09/25/20 75    Renal function: CrCl cannot be calculated (Patient's most recent lab result is older than the maximum 21 days allowed.).  Past Medical History:  Diagnosis Date  . Arthritis    HANDS AND BACK  . 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 Breast cancer ([facility]) 2010   RIGHT  . CAD (coronary artery disease)   . Dysuria   . Family  history of breast cancer   . GERD (gastroesophageal reflux disease)    HX OF  . Heart murmur   . History of blood transfusion   . History of breast cancer ONCOLOGIST-- DR Benay Spice   DX 2010--  RIGHT BREAST DCIS  HIGH GRADE S/P LUMPECTOMY W/ SLN BX--  NO RECURRENCE  . History of kidney stones   . 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 History of skin cancer    excision basal cell  . Hyperlipidemia   . Hypertension   . Kidney stones 7/14  . Nocturia   . 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 Renal stones 2014  . Shingles 30 YRS AGO  . Ureteral calculi    bilateral    Current Outpatient Medications on File Prior to Visit  Medication Sig Dispense Refill  . amLODipine (NORVASC) 10 MG tablet Take 1 tablet (10 mg total) by mouth daily. 90 tablet 1  . anastrozole (ARIMIDEX) 1 MG tablet TAKE 1 TABLET DAILY 90 tablet 3  . aspirin EC 81 MG tablet Take 81 mg by mouth daily.    . BELSOMRA 10 MG TABS Take 10 mg by mouth at bedtime as needed (sleep).     . Biotin 10000 MCG TABS Take 10,000 mcg by mouth daily.    . Calcium Carbonate-Vitamin D (CALTRATE 600+D PO) Take 1 tablet by mouth 2 (two) times daily.    . carvedilol (COREG) 25 MG tablet Take 1 tablet (25 mg total) by mouth 2 (two) times daily with a meal. Please keep 7/23 appointment for additional refills thanks. 180 tablet 0  . chlorthalidone (HYGROTON) 25 MG tablet Take 0.5 tablets (12.5 mg total) by mouth daily. 15 tablet 11  . Cholecalciferol (VITAMIN D3) 50 MCG (2000 UT) TABS Take 2,000 Units by mouth in the morning and at bedtime.    . FEROSUL 325 (65 Fe) MG tablet Take 325 mg by mouth daily.    . hydrALAZINE (APRESOLINE) 100 MG tablet Take 1 tablet (100 mg total) by mouth 3 (three) times daily. 270 tablet 3  .  isosorbide mononitrate (IMDUR) 60 MG 24 hr tablet TAKE 1 TABLET BY MOUTH DAILY. 90 tablet 3  . PROCTOZONE-HC 2.5 % rectal cream PLACE 1 APPLICATION RECTALLY 2 TIMES A DAY AS NEEDED FOR HEMORRHOIDS. 60 g 11  . triamcinolone (KENALOG) 0.1 % Apply topically as needed.     No current facility-administered medications on file prior to visit.    Allergies  Allergen Reactions  . Ramipril Other (See Comments)     Assessment/Plan:  1. Hypertension - BP improved today and is  closer to goal <130/57mmHg. Checking BMET today with recent change to daily chlorthalidone. Advised pt to move Imdur and amlodipine dosing to evening time to see if this helps decrease fatigue she's been noticing in the AM after her morning meds. She will follow up with her PCP regarding leg weakness that she's noticed for the past 1-2 years.   Will forward to Dr Tamala Julian for additional input regarding chest pressure at rest that's been ongoing ~5 days a week for the past 1-2 years. Improvement in BP and adherence to Imdur have not helped improve her sx. For now, will continue amlodipine 10mg  daily, carvedilol 25mg  BID, chlorthalidone 12.5mg  daily, hydralazine 100mg  TID, and Imdur 60mg  daily. Will call pt tomorrow with lab results, can consider dose increase of chlorthalidone or ARB initiation.  Malynda Smolinski E. Jheremy Boger, PharmD, BCACP, [facility] [address] [place], [place] [address] Phone: [phone]; Fax: [phone] 03/12/2021 9:16 AM

## 2021-03-13 LAB — BASIC METABOLIC PANEL
BUN/Creatinine Ratio: 28 (ref 12–28)
BUN: 39 mg/dL — ABNORMAL HIGH (ref 8–27)
CO2: 19 mmol/L — ABNORMAL LOW (ref 20–29)
Calcium: 9.3 mg/dL (ref 8.7–10.3)
Chloride: 102 mmol/L (ref 96–106)
Creatinine, Ser: 1.39 mg/dL — ABNORMAL HIGH (ref 0.57–1.00)
Glucose: 146 mg/dL — ABNORMAL HIGH (ref 65–99)
Potassium: 4.1 mmol/L (ref 3.5–5.2)
Sodium: 137 mmol/L (ref 134–144)
eGFR: 37 mL/min/{1.73_m2} — ABNORMAL LOW (ref >59)

## 2021-03-14 DIAGNOSIS — I2510 Atherosclerotic heart disease of native coronary artery without angina pectoris: Secondary | ICD-10-CM | POA: Diagnosis not present

## 2021-03-14 DIAGNOSIS — M1990 Unspecified osteoarthritis, unspecified site: Secondary | ICD-10-CM | POA: Diagnosis not present

## 2021-03-14 DIAGNOSIS — E785 Hyperlipidemia, unspecified: Secondary | ICD-10-CM | POA: Diagnosis not present

## 2021-03-14 DIAGNOSIS — N1832 Chronic kidney disease, stage 3b: Secondary | ICD-10-CM | POA: Diagnosis not present

## 2021-03-14 DIAGNOSIS — E7800 Pure hypercholesterolemia, unspecified: Secondary | ICD-10-CM | POA: Diagnosis not present

## 2021-03-14 DIAGNOSIS — I10 Essential (primary) hypertension: Secondary | ICD-10-CM | POA: Diagnosis not present

## 2021-03-14 DIAGNOSIS — D510 Vitamin B12 deficiency anemia due to intrinsic factor deficiency: Secondary | ICD-10-CM | POA: Diagnosis not present

## 2021-03-14 DIAGNOSIS — G8929 Other chronic pain: Secondary | ICD-10-CM | POA: Diagnosis not present

## 2021-03-19 ENCOUNTER — Telehealth: Payer: Self-pay | Admitting: Pharmacist

## 2021-03-19 MED ORDER — SPIRONOLACTONE 25 MG PO TABS: 12.5000 mg | ORAL_TABLET | ORAL | 3 refills | Status: DC

## 2021-03-19 NOTE — Telephone Encounter (Signed)
Called pt and reviewed her labs. BMP stable. Per Dr. Tamala Julian add spironolactone 12.5mg  MWF. Advised pt of changes. She is in agreement. Add spironolactone (1/2 tab) 12.5mg  MWF. Continue amlodipine 10mg  daily  carvedilol 25mg  BID , chlorthalidone 12.5mg  daily ,hydralazine 100mg  TID and Imdur 60mg  daily. Follow up in office for BP check and labs on 7/1 @2 :30.

## 2021-03-26 DIAGNOSIS — E538 Deficiency of other specified B group vitamins: Secondary | ICD-10-CM | POA: Diagnosis not present

## 2021-03-30 DIAGNOSIS — I2510 Atherosclerotic heart disease of native coronary artery without angina pectoris: Secondary | ICD-10-CM | POA: Diagnosis not present

## 2021-03-30 DIAGNOSIS — Z170 Estrogen receptor positive status [ER+]: Secondary | ICD-10-CM | POA: Diagnosis not present

## 2021-03-30 DIAGNOSIS — Z0000 Encounter for general adult medical examination without abnormal findings: Secondary | ICD-10-CM | POA: Diagnosis not present

## 2021-03-30 DIAGNOSIS — I10 Essential (primary) hypertension: Secondary | ICD-10-CM | POA: Diagnosis not present

## 2021-03-30 DIAGNOSIS — E7800 Pure hypercholesterolemia, unspecified: Secondary | ICD-10-CM | POA: Diagnosis not present

## 2021-03-30 DIAGNOSIS — K580 Irritable bowel syndrome with diarrhea: Secondary | ICD-10-CM | POA: Diagnosis not present

## 2021-03-30 DIAGNOSIS — D510 Vitamin B12 deficiency anemia due to intrinsic factor deficiency: Secondary | ICD-10-CM | POA: Diagnosis not present

## 2021-03-30 DIAGNOSIS — Z7901 Long term (current) use of anticoagulants: Secondary | ICD-10-CM | POA: Diagnosis not present

## 2021-03-30 DIAGNOSIS — N1832 Chronic kidney disease, stage 3b: Secondary | ICD-10-CM | POA: Diagnosis not present

## 2021-03-30 DIAGNOSIS — N200 Calculus of kidney: Secondary | ICD-10-CM | POA: Diagnosis not present

## 2021-04-06 ENCOUNTER — Ambulatory Visit (INDEPENDENT_AMBULATORY_CARE_PROVIDER_SITE_OTHER): Payer: Medicare Other | Admitting: Pharmacist

## 2021-04-06 ENCOUNTER — Other Ambulatory Visit: Payer: Self-pay

## 2021-04-06 VITALS — BP 134/48 | HR 74

## 2021-04-06 DIAGNOSIS — I6523 Occlusion and stenosis of bilateral carotid arteries: Secondary | ICD-10-CM | POA: Diagnosis not present

## 2021-04-06 DIAGNOSIS — I10 Essential (primary) hypertension: Secondary | ICD-10-CM

## 2021-04-06 NOTE — Progress Notes (Signed)
Patient ID: [name]                 DOB: 1933/12/20                      MRN: [id number]     HPI:  [name] is a 87 y.o. female referred by Dr. Tamala Julian to HTN clinic. PMH is significant for moderate CAD, HTN, HLD, and PACs. Stress test 01/2020 was low risk, EF was 74%. She was started on Imdur due to DOE and chest pain; prior heart cath in 2007 showed 50-60% LAD narrowing, 30-40% ostial PDA narrowing. At last visit with Dr Tamala Julian on 3/7, pt reported some tightness in her chest and palpitations; BP was elevated at 162/64 and she was started on HCTZ 12.5mg  MWF. At last visit with me on 4/27, BP improved to 142/54. Her hydralazine was increased to 100mg  TID, medication administration times were adjusted to optimize efficacy, and clonidine was decreased to 0.1mg  daily. On follow up call 2 weeks later, BP remained 140-150s/60s. Clonidine was stopped and HCTZ 12.5mg  MWF was changed to chlorthalidone 12.5mg  daily. After last visit, lab work was stable and patient was started on spironolactone 12.5mg  daily.  Patient presents today in good spirits.  Feels much better on spironolactone and reports her chest pain has gone away.  Ran out of hydralazine tablets for about 4 days and did not notice any BP increase.  Restarted last night.  Home BP has been low.  Reports recent readings of 134/74, 112/54, 134/56, and 124/56 by memory.    Had PCP appointment with labs on 03/30/21.  CBC showed mild anemia.  Scr increased to 1.59 from 1.39 on 03/12/21.  Current HTN meds: amlodipine 10mg  daily, carvedilol 25mg   BID, chlorthalidone 12.5mg  daily, hydralazine 100mg  TID, Imdur 60mg  daily, spironolactone 12.5mg  daily  Wt Readings from Last 3 Encounters:  12/11/20 129 lb 12.8 oz (58.9 kg)  09/25/20 127 lb 9.6 oz (57.9 kg)  06/22/20 129 lb 1.6 oz (58.6 kg)   BP Readings from Last 3 Encounters:  03/12/21 (!) 138/54  01/31/21 (!) 142/54  12/11/20 (!) 162/64   Pulse Readings from Last 3 Encounters:  03/12/21  74  01/31/21 70  12/11/20 69    Renal function: CrCl cannot be calculated (Patient's most recent lab result is older than the maximum 21 days allowed.).  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   Breast cancer ([facility]) 2010   RIGHT   CAD (coronary artery disease)    Dysuria    Family history of breast cancer    GERD (gastroesophageal reflux disease)    HX OF   Heart murmur    History of blood transfusion    History of breast cancer ONCOLOGIST-- DR Benay Spice   DX 2010--  RIGHT BREAST DCIS  HIGH GRADE S/P LUMPECTOMY W/ SLN BX--  NO RECURRENCE   History of kidney stones    History of ovarian cancer    2009--  S/P COLON RESECTION FOR PAPILLARY SEROUS CARCINOMA, BORDERLINE OVARIAN CANCER VERSUS PRIMARY PERITONEAL CARCINOMA WITH PSAMMOMA BODIES--  NO RECURRENCE   History of skin cancer    excision basal cell   Hyperlipidemia    Hypertension    Kidney stones 7/14   Nocturia    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 90 tablet 1   anastrozole (ARIMIDEX) 1 MG tablet TAKE 1 TABLET DAILY 90 tablet 3   aspirin EC 81 MG tablet Take 81 mg by mouth daily.     BELSOMRA 10 MG TABS Take 10 mg by mouth at bedtime as needed (sleep).      Biotin 10000 MCG TABS Take 10,000 mcg by mouth daily.     Calcium Carbonate-Vitamin D (CALTRATE 600+D PO) Take 1 tablet by mouth 2 (two) times daily.     carvedilol (COREG) 25 MG tablet Take 1 tablet (25 mg total) by mouth 2 (two) times daily with a meal. Please keep 7/23 appointment for additional refills thanks. 180 tablet 0   chlorthalidone (HYGROTON) 25 MG tablet Take 0.5  tablets (12.5 mg total) by mouth daily. 15 tablet 11   Cholecalciferol (VITAMIN D3) 50 MCG (2000 UT) TABS Take 2,000 Units by mouth in the morning and at bedtime.     FEROSUL 325 (65 Fe) MG tablet Take 325 mg by mouth daily.     hydrALAZINE (APRESOLINE) 100 MG tablet Take 1 tablet (100 mg total) by mouth 3 (three) times daily. 270 tablet 3   isosorbide mononitrate (IMDUR) 60 MG 24 hr tablet TAKE 1 TABLET BY MOUTH DAILY. 90 tablet 3   PROCTOZONE-HC 2.5 % rectal cream PLACE 1 APPLICATION RECTALLY 2 TIMES A DAY AS NEEDED FOR HEMORRHOIDS. 60 g 11   spironolactone (ALDACTONE) 25 MG tablet Take 0.5 tablets (12.5 mg total) by mouth every Monday, Wednesday, and Friday. 20 tablet 3   traMADol (ULTRAM) 50 MG tablet Take 50 mg by mouth daily as needed.     triamcinolone (KENALOG) 0.1 % Apply topically as needed.     No current facility-administered medications on file prior to visit.    Allergies  Allergen Reactions   Ramipril Other (See Comments)     Assessment/Plan:  1. Hypertension -  Patient BP in room 134/48 which is very slightly above goal although diastolic trending lower.  However patient is pleased and reports she feels better without any chest pain. Still feels like she has low energy at points during the day.  Patient's Scr trending up however reports she feels well on spironolactone.  Will continue patient on spironolactone and monitor BMP.  Labs scheduled for 1 month from starting medication.  K has remained stable. To decrease pill burden, patient will hold hydralazine through the weekend and see if BP increases sicne she did not notice a change when she was off of it for 4 days.  Will contact patient on Tuesday to check on BP readings from over the weekend.  Continue amlodipine 10mg  daily Continue carvedilol 25mg  BID Continue spironolactone 12.5mg  daily Continue Imdur 60mg  daily Continue chlorthalidone 12.5mg  daily Check BMP in 2 weeks  Karren Cobble, PharmD, BCACP, [place],  [facility] [address], [place], [place] [address] Phone: [phone]; Fax: [phone] 04/06/2021 3:59 PM

## 2021-04-06 NOTE — Patient Instructions (Addendum)
It was nice meeting you today  We would like to keep your blood pressure less than 130/80 but we will not be too aggressive  To cut down on your medications and because you felt better, you can stop the hydralazine for this holiday weekend.  Continue to monitor your blood pressure and if it increases let us know and we may restart it  Continue: Amlodipine 10mg  daily Carvedilol 25mg  twice a day Spironolactone 25mg  (1/2 tablet) once a day Chlorthalidone 25mg  (1/2 tablet) once a day Isosorbide 60mg  once a day  We will recheck your lab work in a few weeks  Please call with any questions!  Karren Cobble, PharmD, BCACP, [place], [place] [address], [place], [place] [address] Phone: [phone] Fax: [phone] 04/06/2021 3:09 PM

## 2021-04-16 ENCOUNTER — Other Ambulatory Visit: Payer: Self-pay

## 2021-04-16 ENCOUNTER — Inpatient Hospital Stay: Payer: Medicare Other | Attending: Oncology | Admitting: Oncology

## 2021-04-16 VITALS — BP 159/67 | HR 64 | Temp 97.8°F | Resp 20 | Wt 120.4 lb

## 2021-04-16 DIAGNOSIS — Z170 Estrogen receptor positive status [ER+]: Secondary | ICD-10-CM | POA: Diagnosis not present

## 2021-04-16 DIAGNOSIS — Z79811 Long term (current) use of aromatase inhibitors: Secondary | ICD-10-CM | POA: Insufficient documentation

## 2021-04-16 DIAGNOSIS — C50912 Malignant neoplasm of unspecified site of left female breast: Secondary | ICD-10-CM | POA: Diagnosis not present

## 2021-04-16 DIAGNOSIS — D75839 Thrombocytosis, unspecified: Secondary | ICD-10-CM | POA: Diagnosis not present

## 2021-04-16 NOTE — Progress Notes (Signed)
[facility] OFFICE PROGRESS NOTE   Diagnosis: Breast cancer, history of ovarian versus peritoneal carcinoma  INTERVAL HISTORY:   She reports developing pain in multiple joints beginning in April.  She held Arimidex for 2 weeks and there was no improvement.  She continues to have pain in the shoulders, elbows, and knees.  She discontinued Arimidex again 3 weeks ago and the pain persists.  The pain is partially improved.  She is scheduled to see Dr. Marijean Bravo next week. She is scheduled for a mammogram within the next month. Ms.Strojny relates weight loss to frequent bowel movements following remote bowel surgery.  She reports a good appetite.  Objective:  Vital signs in last 24 hours:  Last menstrual period 10/07/1974.    Lymphatics: No cervical, supraclavicular, or axillary nodes Resp: Lungs clear bilaterally Cardio: Regular rate and rhythm with premature beats GI: No hepatomegaly Vascular: No leg edema Breast: Status post bilateral lumpectomy.  No mass in either breast. Musculoskeletal: Hand, wrist, and elbows without erythema or swelling  Lab Results:  Lab Results  Component Value Date   WBC 9.4 06/22/2020   HGB 12.3 06/22/2020   HCT 36.9 06/22/2020   MCV 94.9 06/22/2020   PLT 424 (H) 06/22/2020   NEUTROABS 7.1 06/22/2020    CMP  Lab Results  Component Value Date   NA 137 03/12/2021   K 4.1 03/12/2021   CL 102 03/12/2021   CO2 19 (L) 03/12/2021   GLUCOSE 146 (H) 03/12/2021   BUN 39 (H) 03/12/2021   CREATININE 1.39 (H) 03/12/2021   CALCIUM 9.3 03/12/2021   PROT 6.1 04/11/2009   ALBUMIN 3.3 (L) 04/11/2009   AST 22 04/11/2009   ALT 41 (H) 04/11/2009   ALKPHOS 101 04/11/2009   BILITOT 0.7 04/11/2009   GFRNONAA 38 (L) 06/07/2019   GFRAA 44 (L) 06/07/2019    Lab Results  Component Value Date   CA125 12 07/15/2016    Medications: I have reviewed the patient's current medications.   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  Status post a needle localized lumpectomy and sentinel lymph node biopsy 04/13/2009 with the pathology confirming high-grade ductal carcinoma in situ with an associated 0.26 mm invasive carcinoma, ER positive, PR positive, and HER2 positive. Status post adjuvant right breast radiation completed 06/19/2009. Initiation of Arimidex 05/01/2009.  Completed 5 years in July 2015 Family history of breast cancer. History of Clostridium difficile colitis. Frequent bowel movements following the bowel resection in 2009. 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  She will have a mammogram within the next month. She complains of pain in multiple joints.  The pain has not resolved with discontinuation of Arimidex.  I think it is unlikely she has arthralgias secondary to Arimidex.  She completed a 5-year course of Arimidex beginning in 2010 and did not have arthralgias.  She has a family history of rheumatoid arthritis.  She has a history of an elevated platelet count.  She may have rheumatoid arthritis.  She will  follow-up with rheumatology next week.  She will resume Arimidex.  She will contact us if the arthralgias worsen when she restarts Arimidex.  Ms.Pohlman will return for an office visit in 6 months.  Betsy Coder, MD  04/16/2021  4:00 PM

## 2021-04-18 ENCOUNTER — Other Ambulatory Visit: Payer: Medicare Other | Admitting: *Deleted

## 2021-04-18 ENCOUNTER — Other Ambulatory Visit: Payer: Self-pay

## 2021-04-18 DIAGNOSIS — D75839 Thrombocytosis, unspecified: Secondary | ICD-10-CM

## 2021-04-18 DIAGNOSIS — Z23 Encounter for immunization: Secondary | ICD-10-CM | POA: Diagnosis not present

## 2021-04-18 DIAGNOSIS — I10 Essential (primary) hypertension: Secondary | ICD-10-CM

## 2021-04-19 ENCOUNTER — Telehealth: Payer: Self-pay | Admitting: Pharmacist

## 2021-04-19 DIAGNOSIS — I10 Essential (primary) hypertension: Secondary | ICD-10-CM

## 2021-04-19 LAB — BASIC METABOLIC PANEL
BUN/Creatinine Ratio: 24 (ref 12–28)
BUN: 37 mg/dL — ABNORMAL HIGH (ref 8–27)
CO2: 18 mmol/L — ABNORMAL LOW (ref 20–29)
Calcium: 9 mg/dL (ref 8.7–10.3)
Chloride: 107 mmol/L — ABNORMAL HIGH (ref 96–106)
Creatinine, Ser: 1.53 mg/dL — ABNORMAL HIGH (ref 0.57–1.00)
Glucose: 180 mg/dL — ABNORMAL HIGH (ref 65–99)
Potassium: 5.3 mmol/L — ABNORMAL HIGH (ref 3.5–5.2)
Sodium: 139 mmol/L (ref 134–144)
eGFR: 33 mL/min/{1.73_m2} — ABNORMAL LOW (ref >59)

## 2021-04-19 NOTE — Telephone Encounter (Signed)
Increase in Scr and K.  Will need to d/c spironolactone.  Called patient and [name]

## 2021-04-19 NOTE — Addendum Note (Signed)
Addended by: Rollen Sox on: 04/19/2021 05:19 PM   Modules accepted: Orders

## 2021-04-19 NOTE — Telephone Encounter (Signed)
Patient called back to discuss lab results.  Concern over rise in K since starting spiro even though pt reports she feels better on it.  Will hold for now and recheck BMP in 2 weeks.  Patient voiced understanding.

## 2021-04-20 DIAGNOSIS — N189 Chronic kidney disease, unspecified: Secondary | ICD-10-CM | POA: Diagnosis not present

## 2021-04-20 DIAGNOSIS — E559 Vitamin D deficiency, unspecified: Secondary | ICD-10-CM | POA: Diagnosis not present

## 2021-04-20 DIAGNOSIS — N1832 Chronic kidney disease, stage 3b: Secondary | ICD-10-CM | POA: Diagnosis not present

## 2021-04-20 DIAGNOSIS — D631 Anemia in chronic kidney disease: Secondary | ICD-10-CM | POA: Diagnosis not present

## 2021-04-20 DIAGNOSIS — I129 Hypertensive chronic kidney disease with stage 1 through stage 4 chronic kidney disease, or unspecified chronic kidney disease: Secondary | ICD-10-CM | POA: Diagnosis not present

## 2021-04-25 DIAGNOSIS — M2559 Pain in other specified joint: Secondary | ICD-10-CM | POA: Diagnosis not present

## 2021-04-25 DIAGNOSIS — M150 Primary generalized (osteo)arthritis: Secondary | ICD-10-CM | POA: Diagnosis not present

## 2021-04-25 DIAGNOSIS — Z681 Body mass index (BMI) 19 or less, adult: Secondary | ICD-10-CM | POA: Diagnosis not present

## 2021-04-25 DIAGNOSIS — M7989 Other specified soft tissue disorders: Secondary | ICD-10-CM | POA: Diagnosis not present

## 2021-04-25 DIAGNOSIS — R5383 Other fatigue: Secondary | ICD-10-CM | POA: Diagnosis not present

## 2021-04-27 DIAGNOSIS — E538 Deficiency of other specified B group vitamins: Secondary | ICD-10-CM | POA: Diagnosis not present

## 2021-05-01 DIAGNOSIS — Z681 Body mass index (BMI) 19 or less, adult: Secondary | ICD-10-CM | POA: Diagnosis not present

## 2021-05-01 DIAGNOSIS — R768 Other specified abnormal immunological findings in serum: Secondary | ICD-10-CM | POA: Diagnosis not present

## 2021-05-01 DIAGNOSIS — M150 Primary generalized (osteo)arthritis: Secondary | ICD-10-CM | POA: Diagnosis not present

## 2021-05-01 DIAGNOSIS — N1832 Chronic kidney disease, stage 3b: Secondary | ICD-10-CM | POA: Diagnosis not present

## 2021-05-01 DIAGNOSIS — M2559 Pain in other specified joint: Secondary | ICD-10-CM | POA: Diagnosis not present

## 2021-05-04 ENCOUNTER — Other Ambulatory Visit: Payer: Self-pay

## 2021-05-04 ENCOUNTER — Other Ambulatory Visit: Payer: Medicare Other | Admitting: *Deleted

## 2021-05-04 DIAGNOSIS — I10 Essential (primary) hypertension: Secondary | ICD-10-CM

## 2021-05-05 LAB — BASIC METABOLIC PANEL
BUN/Creatinine Ratio: 26 (ref 12–28)
BUN: 35 mg/dL — ABNORMAL HIGH (ref 8–27)
CO2: 21 mmol/L (ref 20–29)
Calcium: 9.5 mg/dL (ref 8.7–10.3)
Chloride: 108 mmol/L — ABNORMAL HIGH (ref 96–106)
Creatinine, Ser: 1.34 mg/dL — ABNORMAL HIGH (ref 0.57–1.00)
Glucose: 123 mg/dL — ABNORMAL HIGH (ref 65–99)
Potassium: 4 mmol/L (ref 3.5–5.2)
Sodium: 142 mmol/L (ref 134–144)
eGFR: 39 mL/min/{1.73_m2} — ABNORMAL LOW (ref >59)

## 2021-05-10 DIAGNOSIS — I2510 Atherosclerotic heart disease of native coronary artery without angina pectoris: Secondary | ICD-10-CM | POA: Diagnosis not present

## 2021-05-10 DIAGNOSIS — E7800 Pure hypercholesterolemia, unspecified: Secondary | ICD-10-CM | POA: Diagnosis not present

## 2021-05-10 DIAGNOSIS — E785 Hyperlipidemia, unspecified: Secondary | ICD-10-CM | POA: Diagnosis not present

## 2021-05-10 DIAGNOSIS — N1832 Chronic kidney disease, stage 3b: Secondary | ICD-10-CM | POA: Diagnosis not present

## 2021-05-10 DIAGNOSIS — I10 Essential (primary) hypertension: Secondary | ICD-10-CM | POA: Diagnosis not present

## 2021-05-10 DIAGNOSIS — M170 Bilateral primary osteoarthritis of knee: Secondary | ICD-10-CM | POA: Diagnosis not present

## 2021-05-10 DIAGNOSIS — D510 Vitamin B12 deficiency anemia due to intrinsic factor deficiency: Secondary | ICD-10-CM | POA: Diagnosis not present

## 2021-05-10 DIAGNOSIS — G8929 Other chronic pain: Secondary | ICD-10-CM | POA: Diagnosis not present

## 2021-05-10 DIAGNOSIS — M1990 Unspecified osteoarthritis, unspecified site: Secondary | ICD-10-CM | POA: Diagnosis not present

## 2021-05-21 DIAGNOSIS — R922 Inconclusive mammogram: Secondary | ICD-10-CM | POA: Diagnosis not present

## 2021-05-21 DIAGNOSIS — Z853 Personal history of malignant neoplasm of breast: Secondary | ICD-10-CM | POA: Diagnosis not present

## 2021-05-28 ENCOUNTER — Ambulatory Visit: Payer: Medicare Other | Admitting: Oncology

## 2021-05-29 DIAGNOSIS — E538 Deficiency of other specified B group vitamins: Secondary | ICD-10-CM | POA: Diagnosis not present

## 2021-05-31 ENCOUNTER — Telehealth: Payer: Self-pay | Admitting: Interventional Cardiology

## 2021-05-31 ENCOUNTER — Other Ambulatory Visit: Payer: Self-pay | Admitting: *Deleted

## 2021-05-31 MED ORDER — HYDRALAZINE HCL 10 MG PO TABS: 10.0000 mg | ORAL_TABLET | Freq: Two times a day (BID) | ORAL | 11 refills | Status: DC

## 2021-05-31 NOTE — Telephone Encounter (Signed)
Patient stated she was returning a call.

## 2021-05-31 NOTE — Telephone Encounter (Signed)
Spoke with pt and reviewed recommendations from Dr. Tamala Julian.  See BMET results from 05/04/21.

## 2021-06-29 DIAGNOSIS — E538 Deficiency of other specified B group vitamins: Secondary | ICD-10-CM | POA: Diagnosis not present

## 2021-07-25 DIAGNOSIS — R768 Other specified abnormal immunological findings in serum: Secondary | ICD-10-CM | POA: Diagnosis not present

## 2021-07-25 DIAGNOSIS — M2559 Pain in other specified joint: Secondary | ICD-10-CM | POA: Diagnosis not present

## 2021-07-25 DIAGNOSIS — Z681 Body mass index (BMI) 19 or less, adult: Secondary | ICD-10-CM | POA: Diagnosis not present

## 2021-07-25 DIAGNOSIS — M150 Primary generalized (osteo)arthritis: Secondary | ICD-10-CM | POA: Diagnosis not present

## 2021-07-25 DIAGNOSIS — N1832 Chronic kidney disease, stage 3b: Secondary | ICD-10-CM | POA: Diagnosis not present

## 2021-07-26 DIAGNOSIS — I2510 Atherosclerotic heart disease of native coronary artery without angina pectoris: Secondary | ICD-10-CM | POA: Diagnosis not present

## 2021-07-26 DIAGNOSIS — I10 Essential (primary) hypertension: Secondary | ICD-10-CM | POA: Diagnosis not present

## 2021-07-26 DIAGNOSIS — M1990 Unspecified osteoarthritis, unspecified site: Secondary | ICD-10-CM | POA: Diagnosis not present

## 2021-07-26 DIAGNOSIS — G8929 Other chronic pain: Secondary | ICD-10-CM | POA: Diagnosis not present

## 2021-07-26 DIAGNOSIS — N1832 Chronic kidney disease, stage 3b: Secondary | ICD-10-CM | POA: Diagnosis not present

## 2021-07-26 DIAGNOSIS — E7800 Pure hypercholesterolemia, unspecified: Secondary | ICD-10-CM | POA: Diagnosis not present

## 2021-07-26 DIAGNOSIS — E785 Hyperlipidemia, unspecified: Secondary | ICD-10-CM | POA: Diagnosis not present

## 2021-07-26 DIAGNOSIS — M170 Bilateral primary osteoarthritis of knee: Secondary | ICD-10-CM | POA: Diagnosis not present

## 2021-08-02 DIAGNOSIS — H26493 Other secondary cataract, bilateral: Secondary | ICD-10-CM | POA: Diagnosis not present

## 2021-08-02 DIAGNOSIS — Z79899 Other long term (current) drug therapy: Secondary | ICD-10-CM | POA: Diagnosis not present

## 2021-08-02 DIAGNOSIS — H04123 Dry eye syndrome of bilateral lacrimal glands: Secondary | ICD-10-CM | POA: Diagnosis not present

## 2021-08-02 DIAGNOSIS — Z23 Encounter for immunization: Secondary | ICD-10-CM | POA: Diagnosis not present

## 2021-08-02 DIAGNOSIS — H35363 Drusen (degenerative) of macula, bilateral: Secondary | ICD-10-CM | POA: Diagnosis not present

## 2021-08-02 DIAGNOSIS — E538 Deficiency of other specified B group vitamins: Secondary | ICD-10-CM | POA: Diagnosis not present

## 2021-08-02 DIAGNOSIS — H52223 Regular astigmatism, bilateral: Secondary | ICD-10-CM | POA: Diagnosis not present

## 2021-08-16 DIAGNOSIS — Z23 Encounter for immunization: Secondary | ICD-10-CM | POA: Diagnosis not present

## 2021-09-03 DIAGNOSIS — E538 Deficiency of other specified B group vitamins: Secondary | ICD-10-CM | POA: Diagnosis not present

## 2021-09-13 IMAGING — DX DG KNEE COMPLETE 4+V*R*
4 series · 4 of 4 positions shown · non-contrast
Comparison: None.

CLINICAL DATA: Primary osteoarthritis of both knees. Bilateral knee
pain.

EXAM:
RIGHT KNEE - COMPLETE 4+ VIEW

[dg knee complete 4 views right (1 of 4)]
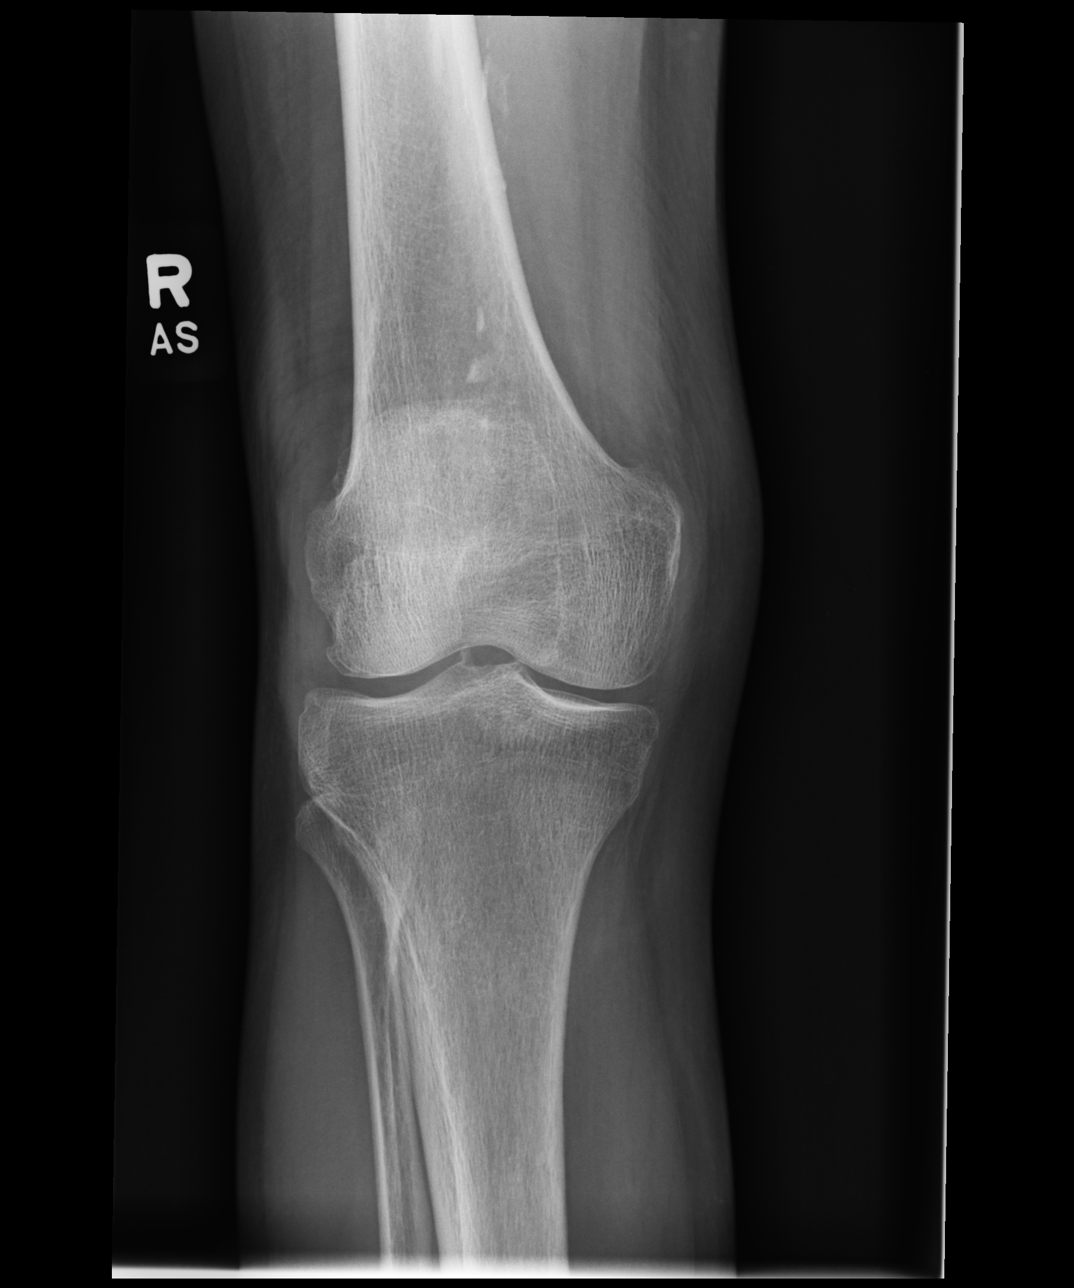

[dg knee complete 4 views right (2 of 4)]
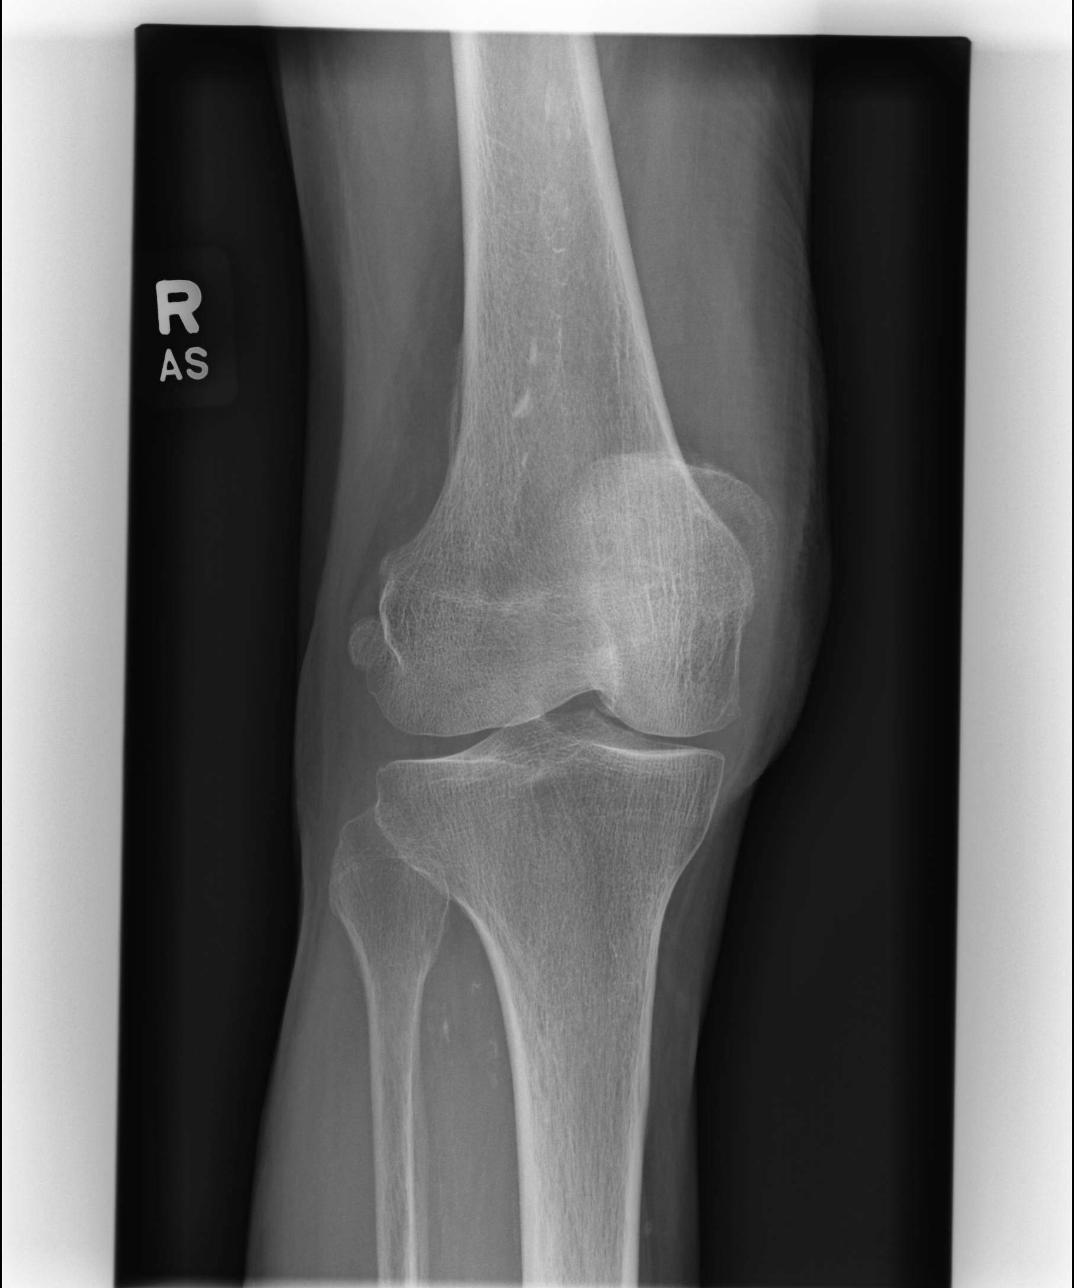

[dg knee complete 4 views right (3 of 4)]
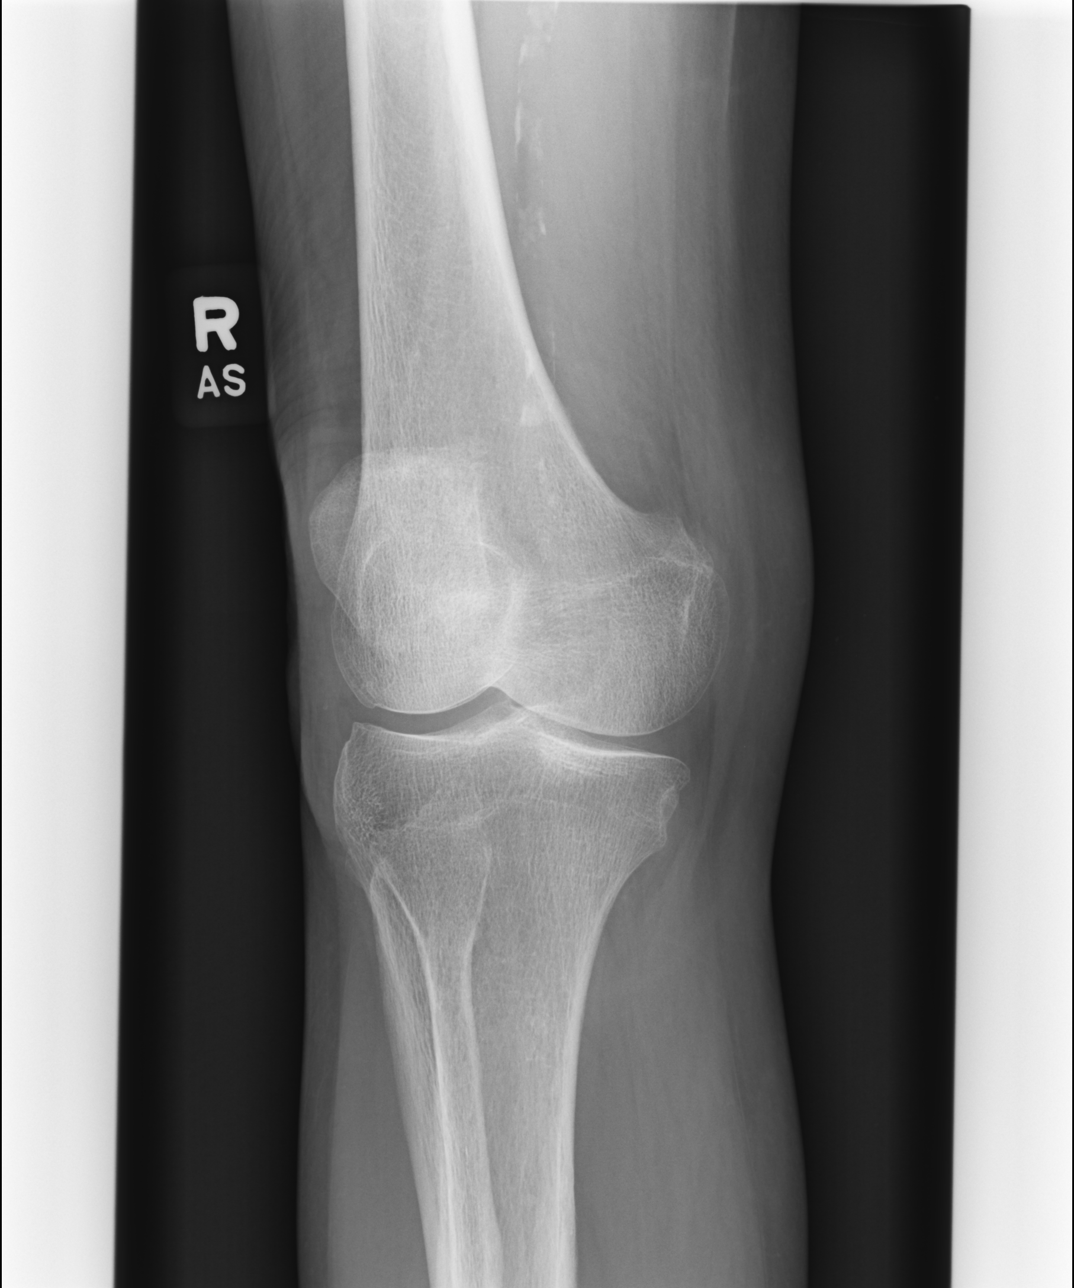

[dg knee complete 4 views right (4 of 4)]
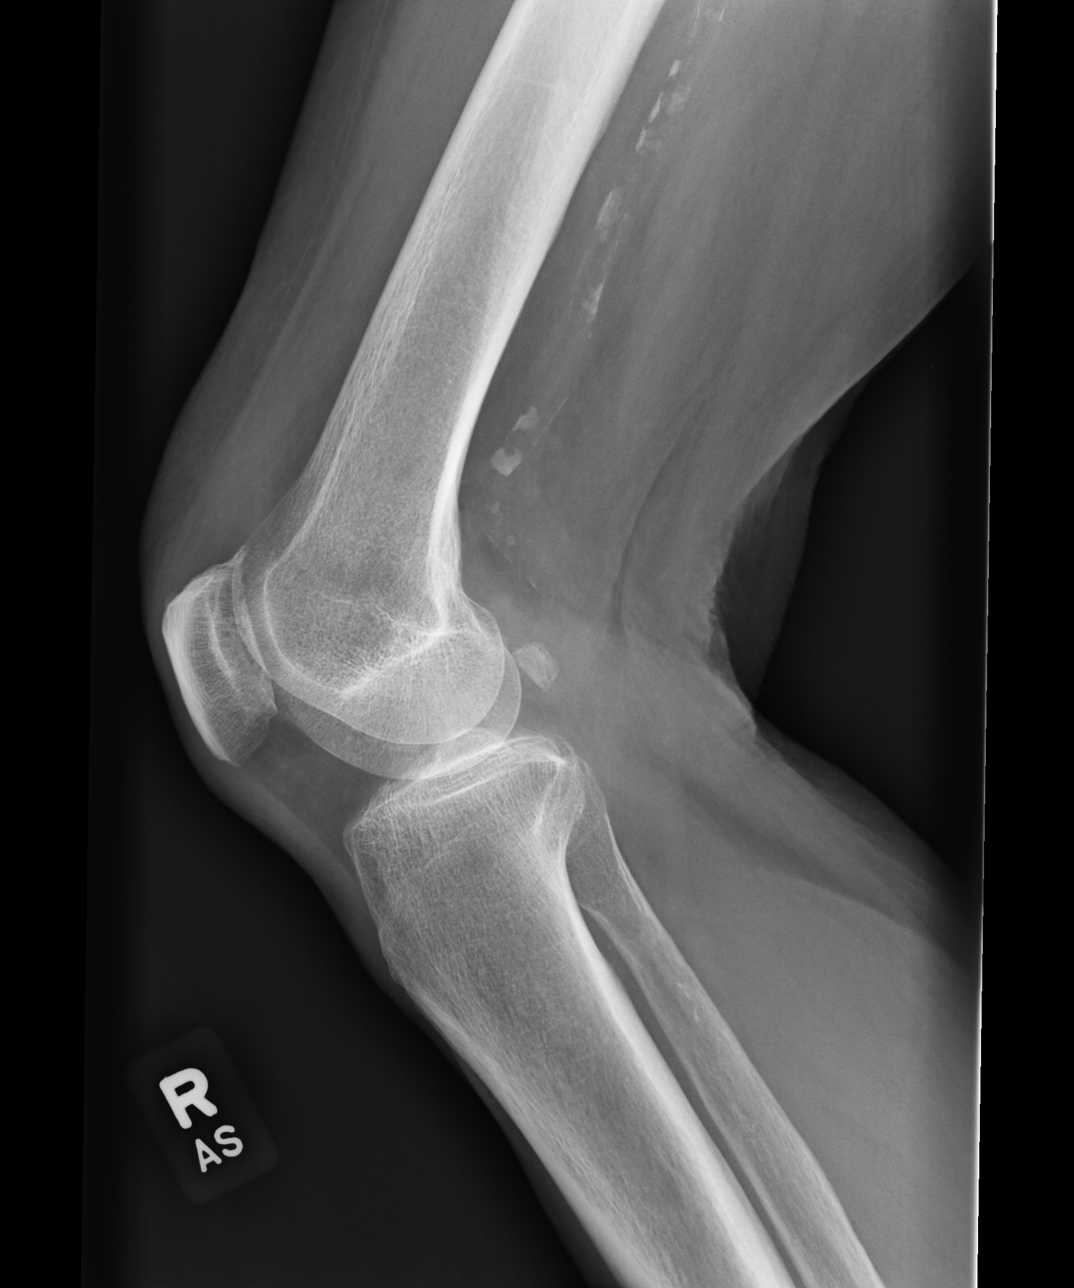

[4 of 4 positions shown; findings below may reference images not displayed]

FINDINGS: Mild medial joint space narrowing. Remaining joint spaces normal. No
significant spurring. Small joint effusion. Negative for fracture.

Arterial calcification
IMPRESSION: Mild medial joint space narrowing and small joint effusion.

## 2021-09-21 ENCOUNTER — Other Ambulatory Visit: Payer: Self-pay | Admitting: Oncology

## 2021-09-24 ENCOUNTER — Other Ambulatory Visit: Payer: Self-pay | Admitting: Oncology

## 2021-09-26 ENCOUNTER — Other Ambulatory Visit: Payer: Self-pay | Admitting: Oncology

## 2021-10-10 ENCOUNTER — Telehealth: Payer: Self-pay | Admitting: Oncology

## 2021-10-10 NOTE — Telephone Encounter (Signed)
Attempted to contact patient to reschedule per Dr. Benay Spice being on call on 1/10. Left message for patient to call back to reschedule.

## 2021-10-12 DIAGNOSIS — Z853 Personal history of malignant neoplasm of breast: Secondary | ICD-10-CM | POA: Diagnosis not present

## 2021-10-12 DIAGNOSIS — N1832 Chronic kidney disease, stage 3b: Secondary | ICD-10-CM | POA: Diagnosis not present

## 2021-10-12 DIAGNOSIS — E538 Deficiency of other specified B group vitamins: Secondary | ICD-10-CM | POA: Diagnosis not present

## 2021-10-12 DIAGNOSIS — I10 Essential (primary) hypertension: Secondary | ICD-10-CM | POA: Diagnosis not present

## 2021-10-12 DIAGNOSIS — I2510 Atherosclerotic heart disease of native coronary artery without angina pectoris: Secondary | ICD-10-CM | POA: Diagnosis not present

## 2021-10-15 DIAGNOSIS — N1832 Chronic kidney disease, stage 3b: Secondary | ICD-10-CM | POA: Diagnosis not present

## 2021-10-15 DIAGNOSIS — N189 Chronic kidney disease, unspecified: Secondary | ICD-10-CM | POA: Diagnosis not present

## 2021-10-16 ENCOUNTER — Other Ambulatory Visit: Payer: Medicare Other

## 2021-10-16 ENCOUNTER — Ambulatory Visit: Payer: Medicare Other | Admitting: Oncology

## 2021-10-25 ENCOUNTER — Other Ambulatory Visit: Payer: Self-pay | Admitting: *Deleted

## 2021-10-25 DIAGNOSIS — D75839 Thrombocytosis, unspecified: Secondary | ICD-10-CM

## 2021-10-26 ENCOUNTER — Inpatient Hospital Stay: Payer: Medicare Other | Attending: Oncology

## 2021-10-26 ENCOUNTER — Other Ambulatory Visit: Payer: Self-pay

## 2021-10-26 ENCOUNTER — Inpatient Hospital Stay (HOSPITAL_BASED_OUTPATIENT_CLINIC_OR_DEPARTMENT_OTHER): Payer: Medicare Other | Admitting: Oncology

## 2021-10-26 VITALS — BP 131/58 | HR 68 | Temp 97.8°F | Resp 19 | Ht 66.0 in | Wt 122.6 lb

## 2021-10-26 DIAGNOSIS — C800 Disseminated malignant neoplasm, unspecified: Secondary | ICD-10-CM | POA: Insufficient documentation

## 2021-10-26 DIAGNOSIS — C50412 Malignant neoplasm of upper-outer quadrant of left female breast: Secondary | ICD-10-CM

## 2021-10-26 DIAGNOSIS — C50912 Malignant neoplasm of unspecified site of left female breast: Secondary | ICD-10-CM | POA: Diagnosis not present

## 2021-10-26 DIAGNOSIS — Z170 Estrogen receptor positive status [ER+]: Secondary | ICD-10-CM | POA: Insufficient documentation

## 2021-10-26 DIAGNOSIS — D75839 Thrombocytosis, unspecified: Secondary | ICD-10-CM

## 2021-10-26 DIAGNOSIS — Z79811 Long term (current) use of aromatase inhibitors: Secondary | ICD-10-CM | POA: Diagnosis not present

## 2021-10-26 LAB — CBC WITH DIFFERENTIAL (CANCER CENTER ONLY)
Abs Immature Granulocytes: 0.08 10*3/uL — ABNORMAL HIGH (ref 0.00–0.07)
Basophils Absolute: 0.2 10*3/uL — ABNORMAL HIGH (ref 0.0–0.1)
Basophils Relative: 2 %
Eosinophils Absolute: 0.3 10*3/uL (ref 0.0–0.5)
Eosinophils Relative: 3 %
HCT: 40.7 % (ref 36.0–46.0)
Hemoglobin: 13.3 g/dL (ref 12.0–15.0)
Immature Granulocytes: 1 %
Lymphocytes Relative: 10 %
Lymphs Abs: 1.1 10*3/uL (ref 0.7–4.0)
MCH: 30.5 pg (ref 26.0–34.0)
MCHC: 32.7 g/dL (ref 30.0–36.0)
MCV: 93.3 fL (ref 80.0–100.0)
Monocytes Absolute: 0.6 10*3/uL (ref 0.1–1.0)
Monocytes Relative: 5 %
Neutro Abs: 8.4 10*3/uL — ABNORMAL HIGH (ref 1.7–7.7)
Neutrophils Relative %: 79 %
Platelet Count: 450 10*3/uL — ABNORMAL HIGH (ref 150–400)
RBC: 4.36 MIL/uL (ref 3.87–5.11)
RDW: 14.3 % (ref 11.5–15.5)
WBC Count: 10.6 10*3/uL — ABNORMAL HIGH (ref 4.0–10.5)
nRBC: 0 % (ref 0.0–0.2)

## 2021-10-26 MED ORDER — ANASTROZOLE 1 MG PO TABS: 1.0000 mg | ORAL_TABLET | Freq: Every day | ORAL | 3 refills | Status: DC

## 2021-10-26 NOTE — Progress Notes (Signed)
[name] OFFICE PROGRESS NOTE   Diagnosis: Breast cancer  INTERVAL HISTORY:    [name] returns as scheduled.  She continues anastrozole.  No hot flashes.  She has arthralgias in multiple joints, chiefly her shoulders.  She thinks the arthralgias may be related to rheumatoid arthritis.  She is followed by rheumatology. No change of either breast.  She is scheduled for a mammogram in July.  Objective:  Vital signs in last 24 hours:  Blood pressure (!) 131/58, pulse 68, temperature 97.8 F (36.6 C), temperature source Oral, resp. rate 19, height $RemoveBe'5\' 6"'MycndFpZU$  (1.676 m), weight 122 lb 9.6 oz (55.6 kg), last menstrual period 10/07/1974, SpO2 99 %.    Lymphatics: No cervical, supraclavicular, axillary, or inguinal nodes Resp: Lungs clear bilaterally Cardio: Regular rate and rhythm GI: No hepatosplenomegaly, no apparent ascites, no mass, nontender Vascular: No leg edema  Breast: Bilateral lumpectomy, no evidence for local tumor recurrence.  No mass in either breast. Musculoskeletal: Synovial hypertrophy at the distal hand joints   Lab Results:  Lab Results  Component Value Date   WBC 10.6 (H) 10/26/2021   HGB 13.3 10/26/2021   HCT 40.7 10/26/2021   MCV 93.3 10/26/2021   PLT 450 (H) 10/26/2021   NEUTROABS 8.4 (H) 10/26/2021    CMP  Lab Results  Component Value Date   NA 142 05/04/2021   K 4.0 05/04/2021   CL 108 (H) 05/04/2021   CO2 21 05/04/2021   GLUCOSE 123 (H) 05/04/2021   BUN 35 (H) 05/04/2021   CREATININE 1.34 (H) 05/04/2021   CALCIUM 9.5 05/04/2021   PROT 6.1 04/11/2009   ALBUMIN 3.3 (L) 04/11/2009   AST 22 04/11/2009   ALT 41 (H) 04/11/2009   ALKPHOS 101 04/11/2009   BILITOT 0.7 04/11/2009   GFRNONAA 38 (L) 06/07/2019   GFRAA 44 (L) 06/07/2019    Medications: I have reviewed the patient's current medications.   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  Status post a needle localized lumpectomy and sentinel lymph node biopsy 04/13/2009 with the pathology confirming high-grade ductal carcinoma in situ with an associated 0.26 mm invasive carcinoma, ER positive, PR positive, and HER2 positive. Status post adjuvant right breast radiation completed 06/19/2009. Initiation of Arimidex 05/01/2009.  Completed 5 years in July 2015 Family history of breast cancer. History of Clostridium difficile colitis. Frequent bowel movements following the bowel resection in 2009. 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 Rochette is in clinical remission from breast cancer.  She appears to be tolerating the anastrozole well.  She will continue anastrozole.  She is scheduled for a mammogram in July.  She has mild thrombocytosis and neutrophilia.  I suspect this is related to rheumatoid arthritis.  She denies steroid use.  Review of the medical record indicates  thrombocytosis for the past several years and intermittent leukocytosis.  I have a low clinical suspicion for a myeloproliferative disorder.  We will check the CBC when she returns in 8 months.  Betsy Coder, MD  10/26/2021  12:07 PM

## 2021-10-29 ENCOUNTER — Other Ambulatory Visit: Payer: Self-pay

## 2021-10-29 ENCOUNTER — Telehealth: Payer: Self-pay

## 2021-10-29 MED ORDER — ANASTROZOLE 1 MG PO TABS: 1.0000 mg | ORAL_TABLET | Freq: Every day | ORAL | 3 refills | Status: DC

## 2021-10-29 NOTE — Telephone Encounter (Signed)
Patient called the office and stated [facility] is out of stock of Anastrozole and she need the prescription to be send to [facility]. Prescription was refill and send to [facility]. I called the patient to let her know the prescription was send in. Patient voiced understanding

## 2021-11-06 DIAGNOSIS — D631 Anemia in chronic kidney disease: Secondary | ICD-10-CM | POA: Diagnosis not present

## 2021-11-06 DIAGNOSIS — E559 Vitamin D deficiency, unspecified: Secondary | ICD-10-CM | POA: Diagnosis not present

## 2021-11-06 DIAGNOSIS — N1832 Chronic kidney disease, stage 3b: Secondary | ICD-10-CM | POA: Diagnosis not present

## 2021-11-06 DIAGNOSIS — I129 Hypertensive chronic kidney disease with stage 1 through stage 4 chronic kidney disease, or unspecified chronic kidney disease: Secondary | ICD-10-CM | POA: Diagnosis not present

## 2021-11-08 DIAGNOSIS — Z681 Body mass index (BMI) 19 or less, adult: Secondary | ICD-10-CM | POA: Diagnosis not present

## 2021-11-08 DIAGNOSIS — N1832 Chronic kidney disease, stage 3b: Secondary | ICD-10-CM | POA: Diagnosis not present

## 2021-11-08 DIAGNOSIS — M150 Primary generalized (osteo)arthritis: Secondary | ICD-10-CM | POA: Diagnosis not present

## 2021-11-08 DIAGNOSIS — Z79899 Other long term (current) drug therapy: Secondary | ICD-10-CM | POA: Diagnosis not present

## 2021-11-08 DIAGNOSIS — R768 Other specified abnormal immunological findings in serum: Secondary | ICD-10-CM | POA: Diagnosis not present

## 2021-11-12 DIAGNOSIS — E538 Deficiency of other specified B group vitamins: Secondary | ICD-10-CM | POA: Diagnosis not present

## 2021-12-14 DIAGNOSIS — E538 Deficiency of other specified B group vitamins: Secondary | ICD-10-CM | POA: Diagnosis not present

## 2022-01-15 DIAGNOSIS — E538 Deficiency of other specified B group vitamins: Secondary | ICD-10-CM | POA: Diagnosis not present

## 2022-01-30 DIAGNOSIS — Z23 Encounter for immunization: Secondary | ICD-10-CM | POA: Diagnosis not present

## 2022-01-30 DIAGNOSIS — L0390 Cellulitis, unspecified: Secondary | ICD-10-CM | POA: Diagnosis not present

## 2022-01-30 DIAGNOSIS — L03113 Cellulitis of right upper limb: Secondary | ICD-10-CM | POA: Diagnosis not present

## 2022-02-01 ENCOUNTER — Emergency Department (HOSPITAL_COMMUNITY): Payer: Medicare Other

## 2022-02-01 ENCOUNTER — Inpatient Hospital Stay (HOSPITAL_COMMUNITY)
Admission: EM | Admit: 2022-02-01 | Discharge: 2022-02-03 | DRG: 982 | Disposition: A | Discharge status: Home / Self Care | Payer: Medicare Other | Source: Ambulatory Visit | Attending: Internal Medicine | Admitting: Internal Medicine

## 2022-02-01 ENCOUNTER — Other Ambulatory Visit: Payer: Self-pay

## 2022-02-01 ENCOUNTER — Encounter (HOSPITAL_COMMUNITY): Payer: Self-pay | Admitting: *Deleted

## 2022-02-01 DIAGNOSIS — Z7982 Long term (current) use of aspirin: Secondary | ICD-10-CM

## 2022-02-01 DIAGNOSIS — Z170 Estrogen receptor positive status [ER+]: Secondary | ICD-10-CM

## 2022-02-01 DIAGNOSIS — C50412 Malignant neoplasm of upper-outer quadrant of left female breast: Secondary | ICD-10-CM | POA: Diagnosis not present

## 2022-02-01 DIAGNOSIS — M7989 Other specified soft tissue disorders: Secondary | ICD-10-CM | POA: Diagnosis not present

## 2022-02-01 DIAGNOSIS — L089 Local infection of the skin and subcutaneous tissue, unspecified: Secondary | ICD-10-CM

## 2022-02-01 DIAGNOSIS — Z8543 Personal history of malignant neoplasm of ovary: Secondary | ICD-10-CM

## 2022-02-01 DIAGNOSIS — S60511A Abrasion of right hand, initial encounter: Secondary | ICD-10-CM | POA: Diagnosis present

## 2022-02-01 DIAGNOSIS — Z85828 Personal history of other malignant neoplasm of skin: Secondary | ICD-10-CM | POA: Diagnosis not present

## 2022-02-01 DIAGNOSIS — Z79899 Other long term (current) drug therapy: Secondary | ICD-10-CM | POA: Diagnosis not present

## 2022-02-01 DIAGNOSIS — I10 Essential (primary) hypertension: Secondary | ICD-10-CM | POA: Diagnosis not present

## 2022-02-01 DIAGNOSIS — Z8249 Family history of ischemic heart disease and other diseases of the circulatory system: Secondary | ICD-10-CM

## 2022-02-01 DIAGNOSIS — Z803 Family history of malignant neoplasm of breast: Secondary | ICD-10-CM

## 2022-02-01 DIAGNOSIS — L02511 Cutaneous abscess of right hand: Principal | ICD-10-CM | POA: Diagnosis present

## 2022-02-01 DIAGNOSIS — L03113 Cellulitis of right upper limb: Secondary | ICD-10-CM | POA: Diagnosis not present

## 2022-02-01 DIAGNOSIS — S61451A Open bite of right hand, initial encounter: Secondary | ICD-10-CM | POA: Diagnosis not present

## 2022-02-01 DIAGNOSIS — Z79811 Long term (current) use of aromatase inhibitors: Secondary | ICD-10-CM

## 2022-02-01 DIAGNOSIS — W5503XA Scratched by cat, initial encounter: Secondary | ICD-10-CM | POA: Diagnosis not present

## 2022-02-01 DIAGNOSIS — Z853 Personal history of malignant neoplasm of breast: Secondary | ICD-10-CM

## 2022-02-01 DIAGNOSIS — M1990 Unspecified osteoarthritis, unspecified site: Secondary | ICD-10-CM | POA: Diagnosis not present

## 2022-02-01 LAB — COMPREHENSIVE METABOLIC PANEL
ALT: 14 U/L (ref 0–44)
AST: 14 U/L — ABNORMAL LOW (ref 15–41)
Albumin: 3.7 g/dL (ref 3.5–5.0)
Alkaline Phosphatase: 97 U/L (ref 38–126)
Anion gap: 8 (ref 5–15)
BUN: 30 mg/dL — ABNORMAL HIGH (ref 8–23)
CO2: 22 mmol/L (ref 22–32)
Calcium: 9.5 mg/dL (ref 8.9–10.3)
Chloride: 111 mmol/L (ref 98–111)
Creatinine, Ser: 1.19 mg/dL — ABNORMAL HIGH (ref 0.44–1.00)
GFR, Estimated: 45 mL/min — ABNORMAL LOW (ref >60)
Glucose, Bld: 101 mg/dL — ABNORMAL HIGH (ref 70–99)
Potassium: 3.8 mmol/L (ref 3.5–5.1)
Sodium: 141 mmol/L (ref 135–145)
Total Bilirubin: 0.7 mg/dL (ref 0.3–1.2)
Total Protein: 6.6 g/dL (ref 6.5–8.1)

## 2022-02-01 LAB — LACTIC ACID, PLASMA: Lactic Acid, Venous: 1.2 mmol/L (ref 0.5–1.9)

## 2022-02-01 LAB — CBC WITH DIFFERENTIAL/PLATELET
Abs Immature Granulocytes: 0.08 10*3/uL — ABNORMAL HIGH (ref 0.00–0.07)
Basophils Absolute: 0.2 10*3/uL — ABNORMAL HIGH (ref 0.0–0.1)
Basophils Relative: 2 %
Eosinophils Absolute: 0.4 10*3/uL (ref 0.0–0.5)
Eosinophils Relative: 4 %
HCT: 40.2 % (ref 36.0–46.0)
Hemoglobin: 13.3 g/dL (ref 12.0–15.0)
Immature Granulocytes: 1 %
Lymphocytes Relative: 9 %
Lymphs Abs: 1 10*3/uL (ref 0.7–4.0)
MCH: 31.6 pg (ref 26.0–34.0)
MCHC: 33.1 g/dL (ref 30.0–36.0)
MCV: 95.5 fL (ref 80.0–100.0)
Monocytes Absolute: 0.7 10*3/uL (ref 0.1–1.0)
Monocytes Relative: 6 %
Neutro Abs: 9.1 10*3/uL — ABNORMAL HIGH (ref 1.7–7.7)
Neutrophils Relative %: 78 %
Platelets: 537 10*3/uL — ABNORMAL HIGH (ref 150–400)
RBC: 4.21 MIL/uL (ref 3.87–5.11)
RDW: 14.2 % (ref 11.5–15.5)
WBC: 11.5 10*3/uL — ABNORMAL HIGH (ref 4.0–10.5)
nRBC: 0 % (ref 0.0–0.2)

## 2022-02-01 NOTE — ED Provider Triage Note (Signed)
Emergency Medicine Provider Triage Evaluation Note ? ?[name] , a 88 y.o. female  was evaluated in triage.  Pt complains of cat scratch.  She sustained a cat scratch to right hand last Saturday.  She says she initially noticed some erythema and swelling to the area over the next several days.  He was seen by Dr. On Wednesday and was prescribed Augmentin.  She has been taking this as prescribed.  She feels like the swelling and redness is not getting any better.  She was seen by another provider today and was referred to the emergency department. ? ?Review of Systems  ?Positive: wound ?Negative:  ? ?Physical Exam  ?BP (!) 177/52   Pulse 62   Temp 98.7 ?F (37.1 ?C)   Resp 16   Ht '5\' 6"'$  (1.676 m)   Wt 55.6 kg   LMP 10/07/1974   SpO2 98%   BMI 19.78 kg/m?  ?Gen:   Awake, no distress   ?Resp:  Normal effort  ?MSK:   Moves extremities without difficulty  ?Other:  There is swelling and erythema to the right hand on the palmar side closest to the thumb.  Fluctuant area.  There are small little puncture wounds have started to heal.  No abnormal drainage.  Pulses intact.  Sensation intact.  Able to squeeze hand completely ? ?Medical Decision Making  ?Medically screening exam initiated at 4:37 PM.  Appropriate orders placed.  [name] was informed that the remainder of the evaluation will be completed by another provider, this initial triage assessment does not replace that evaluation, and the importance of remaining in the ED until their evaluation is complete. ? ?Basic labs with lactate ordered. Hand xray ordered. ?  Adolphus Birchwood, PA-C ?02/01/22 1639 ? ?

## 2022-02-01 NOTE — ED Triage Notes (Signed)
A cat scratched her hand Saturday  red swelling  she was seen at an office earlier today and was sent here today.  She was started on antibiotics on Wednesday but she feels like the habd is not getting any better  no temp ?

## 2022-02-02 ENCOUNTER — Other Ambulatory Visit: Payer: Self-pay

## 2022-02-02 ENCOUNTER — Encounter (HOSPITAL_COMMUNITY)
Admission: EM | Disposition: A | Discharge status: Home / Self Care | Payer: Self-pay | Source: Ambulatory Visit | Attending: Internal Medicine

## 2022-02-02 ENCOUNTER — Inpatient Hospital Stay (HOSPITAL_COMMUNITY): Payer: Medicare Other | Admitting: Anesthesiology

## 2022-02-02 ENCOUNTER — Encounter (HOSPITAL_COMMUNITY): Payer: Self-pay | Admitting: Internal Medicine

## 2022-02-02 DIAGNOSIS — Z79899 Other long term (current) drug therapy: Secondary | ICD-10-CM | POA: Diagnosis not present

## 2022-02-02 DIAGNOSIS — I10 Essential (primary) hypertension: Secondary | ICD-10-CM | POA: Diagnosis not present

## 2022-02-02 DIAGNOSIS — S60511A Abrasion of right hand, initial encounter: Secondary | ICD-10-CM | POA: Diagnosis present

## 2022-02-02 DIAGNOSIS — M1990 Unspecified osteoarthritis, unspecified site: Secondary | ICD-10-CM | POA: Diagnosis not present

## 2022-02-02 DIAGNOSIS — L02511 Cutaneous abscess of right hand: Secondary | ICD-10-CM | POA: Diagnosis present

## 2022-02-02 DIAGNOSIS — Z8249 Family history of ischemic heart disease and other diseases of the circulatory system: Secondary | ICD-10-CM | POA: Diagnosis not present

## 2022-02-02 DIAGNOSIS — Z79811 Long term (current) use of aromatase inhibitors: Secondary | ICD-10-CM | POA: Diagnosis not present

## 2022-02-02 DIAGNOSIS — W5503XA Scratched by cat, initial encounter: Secondary | ICD-10-CM | POA: Diagnosis not present

## 2022-02-02 DIAGNOSIS — S61451A Open bite of right hand, initial encounter: Secondary | ICD-10-CM | POA: Diagnosis not present

## 2022-02-02 DIAGNOSIS — L03113 Cellulitis of right upper limb: Secondary | ICD-10-CM

## 2022-02-02 DIAGNOSIS — Z8543 Personal history of malignant neoplasm of ovary: Secondary | ICD-10-CM | POA: Diagnosis not present

## 2022-02-02 DIAGNOSIS — Z803 Family history of malignant neoplasm of breast: Secondary | ICD-10-CM | POA: Diagnosis not present

## 2022-02-02 DIAGNOSIS — Z170 Estrogen receptor positive status [ER+]: Secondary | ICD-10-CM

## 2022-02-02 DIAGNOSIS — L089 Local infection of the skin and subcutaneous tissue, unspecified: Secondary | ICD-10-CM

## 2022-02-02 DIAGNOSIS — Z7982 Long term (current) use of aspirin: Secondary | ICD-10-CM | POA: Diagnosis not present

## 2022-02-02 DIAGNOSIS — Z853 Personal history of malignant neoplasm of breast: Secondary | ICD-10-CM | POA: Diagnosis not present

## 2022-02-02 DIAGNOSIS — C50412 Malignant neoplasm of upper-outer quadrant of left female breast: Secondary | ICD-10-CM | POA: Diagnosis not present

## 2022-02-02 DIAGNOSIS — Z85828 Personal history of other malignant neoplasm of skin: Secondary | ICD-10-CM | POA: Diagnosis not present

## 2022-02-02 HISTORY — PX: I & D EXTREMITY: SHX5045

## 2022-02-02 LAB — SURGICAL PCR SCREEN
MRSA, PCR: NEGATIVE
Staphylococcus aureus: NEGATIVE

## 2022-02-02 LAB — LACTIC ACID, PLASMA: Lactic Acid, Venous: 1 mmol/L (ref 0.5–1.9)

## 2022-02-02 SURGERY — IRRIGATION AND DEBRIDEMENT EXTREMITY
Anesthesia: General | Laterality: Right

## 2022-02-02 MED ORDER — HYDRALAZINE HCL 10 MG PO TABS
10.0000 mg | ORAL_TABLET | Freq: Two times a day (BID) | ORAL | Status: DC
  Administered 2022-02-02 – 2022-02-03 (×3): 10 mg via ORAL
  Filled 2022-02-02 (×3): qty 1

## 2022-02-02 MED ORDER — ANASTROZOLE 1 MG PO TABS
1.0000 mg | ORAL_TABLET | Freq: Every day | ORAL | Status: DC
  Administered 2022-02-02: 1 mg via ORAL
  Filled 2022-02-02: qty 1

## 2022-02-02 MED ORDER — ACETAMINOPHEN 650 MG RE SUPP: 650.0000 mg | Freq: Four times a day (QID) | RECTAL | Status: DC | PRN

## 2022-02-02 MED ORDER — LIDOCAINE HCL (CARDIAC) PF 100 MG/5ML IV SOSY
PREFILLED_SYRINGE | INTRAVENOUS | Status: DC | PRN
Start: 2022-02-02 — End: 2022-02-02
  Administered 2022-02-02: 40 mg via INTRATRACHEAL

## 2022-02-02 MED ORDER — CARVEDILOL 25 MG PO TABS
25.0000 mg | ORAL_TABLET | Freq: Two times a day (BID) | ORAL | Status: DC
Start: 2022-02-02 — End: 2022-02-03
  Administered 2022-02-02 – 2022-02-03 (×3): 25 mg via ORAL
  Filled 2022-02-02 (×3): qty 1

## 2022-02-02 MED ORDER — POVIDONE-IODINE 10 % EX SWAB
2.0000 "application " | Freq: Once | CUTANEOUS | Status: AC
  Administered 2022-02-02: 2 via TOPICAL

## 2022-02-02 MED ORDER — TRAMADOL HCL 50 MG PO TABS
50.0000 mg | ORAL_TABLET | Freq: Every day | ORAL | Status: DC | PRN
  Administered 2022-02-02: 50 mg via ORAL
  Filled 2022-02-02: qty 1

## 2022-02-02 MED ORDER — PROPOFOL 10 MG/ML IV BOLUS
INTRAVENOUS | Status: DC | PRN
  Administered 2022-02-02: 100 mg via INTRAVENOUS

## 2022-02-02 MED ORDER — GLYCOPYRROLATE 0.2 MG/ML IJ SOLN
INTRAMUSCULAR | Status: DC | PRN
Start: 2022-02-02 — End: 2022-02-02
  Administered 2022-02-02: .2 mg via INTRAVENOUS

## 2022-02-02 MED ORDER — HYDROXYCHLOROQUINE SULFATE 200 MG PO TABS
200.0000 mg | ORAL_TABLET | Freq: Every day | ORAL | Status: DC
  Administered 2022-02-02 – 2022-02-03 (×2): 200 mg via ORAL
  Filled 2022-02-02 (×3): qty 1

## 2022-02-02 MED ORDER — FENTANYL CITRATE (PF) 250 MCG/5ML IJ SOLN
INTRAMUSCULAR | Status: DC | PRN
Start: 2022-02-02 — End: 2022-02-02
  Administered 2022-02-02 (×3): 50 ug via INTRAVENOUS

## 2022-02-02 MED ORDER — ONDANSETRON HCL 4 MG/2ML IJ SOLN
INTRAMUSCULAR | Status: AC
  Filled 2022-02-02: qty 2

## 2022-02-02 MED ORDER — EPHEDRINE 5 MG/ML INJ
INTRAVENOUS | Status: AC
  Filled 2022-02-02: qty 5

## 2022-02-02 MED ORDER — ORAL CARE MOUTH RINSE: 15.0000 mL | Freq: Once | OROMUCOSAL | Status: AC

## 2022-02-02 MED ORDER — ONDANSETRON HCL 4 MG PO TABS
4.0000 mg | ORAL_TABLET | Freq: Four times a day (QID) | ORAL | Status: DC | PRN
Start: 2022-02-02 — End: 2022-02-03

## 2022-02-02 MED ORDER — ZOLPIDEM TARTRATE 5 MG PO TABS: 5.0000 mg | ORAL_TABLET | Freq: Every evening | ORAL | Status: DC | PRN

## 2022-02-02 MED ORDER — PIPERACILLIN-TAZOBACTAM 3.375 G IVPB
3.3750 g | Freq: Three times a day (TID) | INTRAVENOUS | Status: DC
Start: 2022-02-02 — End: 2022-02-03
  Administered 2022-02-02 (×3): 3.375 g via INTRAVENOUS
  Filled 2022-02-02 (×3): qty 50

## 2022-02-02 MED ORDER — AZITHROMYCIN 500 MG PO TABS
500.0000 mg | ORAL_TABLET | Freq: Every day | ORAL | Status: DC
  Administered 2022-02-02 – 2022-02-03 (×2): 500 mg via ORAL
  Filled 2022-02-02 (×2): qty 1

## 2022-02-02 MED ORDER — LIDOCAINE 2% (20 MG/ML) 5 ML SYRINGE
INTRAMUSCULAR | Status: AC
  Filled 2022-02-02: qty 5

## 2022-02-02 MED ORDER — ISOSORBIDE MONONITRATE ER 60 MG PO TB24
60.0000 mg | ORAL_TABLET | Freq: Every day | ORAL | Status: DC
  Administered 2022-02-02 – 2022-02-03 (×2): 60 mg via ORAL
  Filled 2022-02-02 (×2): qty 1

## 2022-02-02 MED ORDER — CHLORHEXIDINE GLUCONATE 0.12 % MT SOLN: 15.0000 mL | Freq: Once | OROMUCOSAL | Status: AC

## 2022-02-02 MED ORDER — CHLORHEXIDINE GLUCONATE 4 % EX LIQD
60.0000 mL | Freq: Once | CUTANEOUS | Status: DC
  Filled 2022-02-02: qty 60

## 2022-02-02 MED ORDER — ACETAMINOPHEN 325 MG PO TABS: 650.0000 mg | ORAL_TABLET | Freq: Four times a day (QID) | ORAL | Status: DC | PRN

## 2022-02-02 MED ORDER — FENTANYL CITRATE (PF) 100 MCG/2ML IJ SOLN
25.0000 ug | INTRAMUSCULAR | Status: DC | PRN
  Administered 2022-02-02 (×4): 25 ug via INTRAVENOUS

## 2022-02-02 MED ORDER — HYDRALAZINE HCL 20 MG/ML IJ SOLN
10.0000 mg | INTRAMUSCULAR | Status: DC | PRN
  Administered 2022-02-02: 10 mg via INTRAVENOUS
  Filled 2022-02-02: qty 1

## 2022-02-02 MED ORDER — LACTATED RINGERS IV SOLN: INTRAVENOUS | Status: DC | PRN

## 2022-02-02 MED ORDER — FENTANYL CITRATE (PF) 100 MCG/2ML IJ SOLN
INTRAMUSCULAR | Status: AC
  Filled 2022-02-02: qty 2

## 2022-02-02 MED ORDER — DEXAMETHASONE SODIUM PHOSPHATE 10 MG/ML IJ SOLN
INTRAMUSCULAR | Status: DC | PRN
  Administered 2022-02-02: 10 mg via INTRAVENOUS

## 2022-02-02 MED ORDER — EPHEDRINE SULFATE (PRESSORS) 50 MG/ML IJ SOLN
INTRAMUSCULAR | Status: DC | PRN
  Administered 2022-02-02: 5 mg via INTRAVENOUS

## 2022-02-02 MED ORDER — 0.9 % SODIUM CHLORIDE (POUR BTL) OPTIME
TOPICAL | Status: DC | PRN
  Administered 2022-02-02: 1000 mL

## 2022-02-02 MED ORDER — FENTANYL CITRATE (PF) 250 MCG/5ML IJ SOLN
INTRAMUSCULAR | Status: AC
  Filled 2022-02-02: qty 5

## 2022-02-02 MED ORDER — DEXAMETHASONE SODIUM PHOSPHATE 10 MG/ML IJ SOLN
INTRAMUSCULAR | Status: AC
  Filled 2022-02-02: qty 1

## 2022-02-02 MED ORDER — CHLORTHALIDONE 25 MG PO TABS
12.5000 mg | ORAL_TABLET | Freq: Two times a day (BID) | ORAL | Status: DC
Start: 2022-02-02 — End: 2022-02-03
  Administered 2022-02-02 – 2022-02-03 (×2): 12.5 mg via ORAL
  Filled 2022-02-02 (×4): qty 0.5

## 2022-02-02 MED ORDER — LACTATED RINGERS IV SOLN: INTRAVENOUS | Status: DC

## 2022-02-02 MED ORDER — ONDANSETRON HCL 4 MG/2ML IJ SOLN
INTRAMUSCULAR | Status: DC | PRN
Start: 2022-02-02 — End: 2022-02-02
  Administered 2022-02-02: 4 mg via INTRAVENOUS

## 2022-02-02 MED ORDER — PROPOFOL 10 MG/ML IV BOLUS
INTRAVENOUS | Status: AC
  Filled 2022-02-02: qty 20

## 2022-02-02 MED ORDER — CHLORHEXIDINE GLUCONATE 0.12 % MT SOLN
OROMUCOSAL | Status: AC
  Administered 2022-02-02: 15 mL via OROMUCOSAL
  Filled 2022-02-02: qty 15

## 2022-02-02 MED ORDER — AMLODIPINE BESYLATE 10 MG PO TABS: 10.0000 mg | ORAL_TABLET | Freq: Every day | ORAL | Status: DC

## 2022-02-02 MED ORDER — PIPERACILLIN-TAZOBACTAM 3.375 G IVPB 30 MIN
3.3750 g | Freq: Once | INTRAVENOUS | Status: AC
  Administered 2022-02-02: 3.375 g via INTRAVENOUS
  Filled 2022-02-02: qty 50

## 2022-02-02 MED ORDER — ONDANSETRON HCL 4 MG/2ML IJ SOLN: 4.0000 mg | Freq: Four times a day (QID) | INTRAMUSCULAR | Status: DC | PRN

## 2022-02-02 MED ORDER — VANCOMYCIN HCL IN DEXTROSE 1-5 GM/200ML-% IV SOLN
1000.0000 mg | Freq: Once | INTRAVENOUS | Status: AC
  Administered 2022-02-02: 1000 mg via INTRAVENOUS
  Filled 2022-02-02: qty 200

## 2022-02-02 SURGICAL SUPPLY — 56 items
BAG COUNTER SPONGE SURGICOUNT (BAG) ×2 IMPLANT
BNDG COHESIVE 1X5 TAN STRL LF (GAUZE/BANDAGES/DRESSINGS) IMPLANT
BNDG CONFORM 2 STRL LF (GAUZE/BANDAGES/DRESSINGS) ×1 IMPLANT
BNDG ELASTIC 3X5.8 VLCR STR LF (GAUZE/BANDAGES/DRESSINGS) ×2 IMPLANT
BNDG ELASTIC 4X5.8 VLCR STR LF (GAUZE/BANDAGES/DRESSINGS) ×1 IMPLANT
BNDG ESMARK 4X9 LF (GAUZE/BANDAGES/DRESSINGS) ×2 IMPLANT
BNDG GAUZE ELAST 4 BULKY (GAUZE/BANDAGES/DRESSINGS) ×1 IMPLANT
CORD BIPOLAR FORCEPS 12FT (ELECTRODE) ×2 IMPLANT
COVER SURGICAL LIGHT HANDLE (MISCELLANEOUS) ×2 IMPLANT
CUFF TOURN SGL QUICK 18X4 (TOURNIQUET CUFF) ×2 IMPLANT
CUFF TOURN SGL QUICK 24 (TOURNIQUET CUFF)
CUFF TRNQT CYL 24X4X16.5-23 (TOURNIQUET CUFF) IMPLANT
DRAIN PENROSE 1/4X12 LTX STRL (WOUND CARE) IMPLANT
DRAPE SURG 17X23 STRL (DRAPES) ×2 IMPLANT
DRSG ADAPTIC 3X8 NADH LF (GAUZE/BANDAGES/DRESSINGS) ×1 IMPLANT
DRSG EMULSION OIL 3X3 NADH (GAUZE/BANDAGES/DRESSINGS) ×1 IMPLANT
ELECT REM PT RETURN 9FT ADLT (ELECTROSURGICAL)
ELECTRODE REM PT RTRN 9FT ADLT (ELECTROSURGICAL) IMPLANT
GAUZE SPONGE 4X4 12PLY STRL (GAUZE/BANDAGES/DRESSINGS) ×2 IMPLANT
GAUZE XEROFORM 1X8 LF (GAUZE/BANDAGES/DRESSINGS) ×1 IMPLANT
GAUZE XEROFORM 5X9 LF (GAUZE/BANDAGES/DRESSINGS) IMPLANT
GLOVE BIOGEL PI IND STRL 8.5 (GLOVE) ×1 IMPLANT
GLOVE BIOGEL PI INDICATOR 8.5 (GLOVE) ×1
GLOVE SURG ORTHO 8.0 STRL STRW (GLOVE) ×2 IMPLANT
GOWN STRL REUS W/ TWL LRG LVL3 (GOWN DISPOSABLE) ×3 IMPLANT
GOWN STRL REUS W/ TWL XL LVL3 (GOWN DISPOSABLE) ×1 IMPLANT
GOWN STRL REUS W/TWL LRG LVL3 (GOWN DISPOSABLE)
GOWN STRL REUS W/TWL XL LVL3 (GOWN DISPOSABLE) ×1
HANDPIECE INTERPULSE COAX TIP (DISPOSABLE)
KIT BASIN OR (CUSTOM PROCEDURE TRAY) ×2 IMPLANT
KIT TURNOVER KIT B (KITS) ×2 IMPLANT
MANIFOLD NEPTUNE II (INSTRUMENTS) ×1 IMPLANT
NDL HYPO 25GX1X1/2 BEV (NEEDLE) IMPLANT
NEEDLE HYPO 25GX1X1/2 BEV (NEEDLE) IMPLANT
NS IRRIG 1000ML POUR BTL (IV SOLUTION) ×2 IMPLANT
PACK ORTHO EXTREMITY (CUSTOM PROCEDURE TRAY) ×2 IMPLANT
PAD ARMBOARD 7.5X6 YLW CONV (MISCELLANEOUS) ×4 IMPLANT
PAD CAST 4YDX4 CTTN HI CHSV (CAST SUPPLIES) ×1 IMPLANT
PADDING CAST COTTON 4X4 STRL (CAST SUPPLIES) ×1
SET CYSTO W/LG BORE CLAMP LF (SET/KITS/TRAYS/PACK) IMPLANT
SET HNDPC FAN SPRY TIP SCT (DISPOSABLE) IMPLANT
SOAP 2 % CHG 4 OZ (WOUND CARE) ×2 IMPLANT
SPONGE T-LAP 18X18 ~~LOC~~+RFID (SPONGE) ×1 IMPLANT
SPONGE T-LAP 4X18 ~~LOC~~+RFID (SPONGE) ×1 IMPLANT
SUT ETHILON 4 0 PS 2 18 (SUTURE) IMPLANT
SUT ETHILON 5 0 P 3 18 (SUTURE)
SUT NYLON ETHILON 5-0 P-3 1X18 (SUTURE) IMPLANT
SWAB COLLECTION DEVICE MRSA (MISCELLANEOUS) ×1 IMPLANT
SWAB CULTURE ESWAB REG 1ML (MISCELLANEOUS) IMPLANT
SYR CONTROL 10ML LL (SYRINGE) IMPLANT
TOWEL GREEN STERILE (TOWEL DISPOSABLE) ×2 IMPLANT
TOWEL GREEN STERILE FF (TOWEL DISPOSABLE) ×2 IMPLANT
TUBE CONNECTING 12X1/4 (SUCTIONS) ×1 IMPLANT
UNDERPAD 30X36 HEAVY ABSORB (UNDERPADS AND DIAPERS) ×2 IMPLANT
WATER STERILE IRR 1000ML POUR (IV SOLUTION) ×1 IMPLANT
YANKAUER SUCT BULB TIP NO VENT (SUCTIONS) ×2 IMPLANT

## 2022-02-02 NOTE — Progress Notes (Signed)
Pt is going to OR. Report is given to [place], Therapist, sports. ?

## 2022-02-02 NOTE — Progress Notes (Signed)
Pharmacy Antibiotic Note ? ?[name] is a 88 y.o. female admitted on 02/01/2022 with  hand infection after cat scratch .  Pharmacy has been consulted for Zosyn dosing.  Had been on Augmentin x2d with worsening pain and swelling. ? ?Plan: ?Zosyn 3.375g IV q8h (4 hour infusion). ? ?Height: '5\' 6"'$  (167.6 cm) ?Weight: 55.6 kg (122 lb 9.2 oz) ?IBW/kg (Calculated) : 59.3 ? ?Temp (24hrs), Avg:98.7 ?F (37.1 ?C), Min:98.7 ?F (37.1 ?C), Max:98.7 ?F (37.1 ?C) ? ?Recent Labs  ?Lab 02/01/22 ?1641  ?WBC 11.5*  ?CREATININE 1.19*  ?LATICACIDVEN 1.2  ?  ?Estimated Creatinine Clearance: 30.3 mL/min (A) (by C-G formula based on SCr of 1.19 mg/dL (H)).   ? ?Allergies  ?Allergen Reactions  ? Ramipril Other (See Comments)  ? ? ?Thank you for allowing pharmacy to be a part of this patient?s care. ? ?Wynona Neat, PharmD, BCPS  ?02/02/2022 2:36 AM ? ?

## 2022-02-02 NOTE — H&P (Signed)
?History and Physical  ? ? ?Patient: ALEKSIS JIGGETTS UDJ:[id number] DOB: 11/16/1933 ?DOA: 02/01/2022 ?DOS: the patient was seen and examined on 02/02/2022 ?PCP: Leeroy Cha, MD  ?Patient coming from: Home ? ?Chief Complaint:  ?Chief Complaint  ?Patient presents with  ? cat scratch  ? ?HPI: MELLISA ARSHAD is a 88 y.o. female with medical history significant of HTN, breast CA on Arimidex. ? ?Pt presents to ED with ongoing infection following cat scratch. ? ?Pt confirms that cat was vaccinated to Rabies. ? ?Cat scratched her several days ago.  Started on augmentin 2 days ago.  Despite this, erythema, swelling, of hand has worsened. ? ?Now in to ED. ?  ?Review of Systems: As mentioned in the history of present illness. All other systems reviewed and are negative. ?Past Medical History:  ?Diagnosis Date  ? Arthritis   ? HANDS AND BACK  ? 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 Breast cancer ([place]) 2010  ? RIGHT  ? CAD (coronary artery disease)   ? Dysuria   ? Family history of breast cancer   ? GERD (gastroesophageal reflux disease)   ? HX OF  ? Heart murmur   ? History of blood transfusion   ? History of breast cancer ONCOLOGIST-- DR Benay Spice  ? DX 2010--  RIGHT BREAST DCIS  HIGH GRADE S/P LUMPECTOMY W/ SLN BX--  NO RECURRENCE  ? History of kidney stones   ? History of ovarian cancer   ? 2009--  S/P COLON RESECTION FOR PAPILLARY SEROUS CARCINOMA, BORDERLINE OVARIAN CANCER VERSUS PRIMARY PERITONEAL CARCINOMA WITH PSAMMOMA BODIES--  NO RECURRENCE  ? History of skin cancer   ? excision basal cell  ? Hyperlipidemia   ? Hypertension   ? Kidney stones 7/14  ? Nocturia   ? Ovarian carcinoma ([place])   ? papillary serous-low grade, recurrence found in fat necrosis during ileostomy  ? Renal stones 2014  ? Shingles 30 YRS AGO  ? Ureteral calculi   ? bilateral  ? ?Past Surgical History:  ?Procedure Laterality Date  ? APPENDECTOMY    ? BREAST LUMPECTOMY Right 04-13-2009  ? W/ SLN BX  ? BREAST LUMPECTOMY WITH  RADIOACTIVE SEED LOCALIZATION Left 06/11/2019  ? Procedure: LEFT BREAST LUMPECTOMY WITH RADIOACTIVE SEED LOCALIZATION;  Surgeon: Jovita Kussmaul, MD;  Location: [facility];  Service: General;  Laterality: Left;  ? CARDIAC CATHETERIZATION  11-11-2005 DR Daneen Schick  ? MODERATE LAD DISEASE/ NORMAL LVF/ EF 65-75%  ? CATARACT EXTRACTION W/ INTRAOCULAR LENS IMPLANT Bilateral 04/26/2013  ? CHOLECYSTECTOMY  1990  ? OPEN  ? COLONOSCOPY    ? CYSTOSCOPY W/ URETERAL STENT PLACEMENT Bilateral 05/07/2013  ? Procedure: CYSTOSCOPY WITH STENT REPLACEMENTS;  Surgeon: Alexis Frock, MD;  Location: [facility];  Service: Urology;  Laterality: Bilateral;  ? CYSTOSCOPY WITH BIOPSY N/A 04/07/2013  ? Procedure: CYSTOSCOPY WITH BIOPSY and fulgeration;  Surgeon: Alexis Frock, MD;  Location: [facility];  Service: Urology;  Laterality: N/A;  ? CYSTOSCOPY WITH RETROGRADE PYELOGRAM, URETEROSCOPY AND STENT PLACEMENT Bilateral 05/07/2013  ? Procedure: CYSTOSCOPY WITH RETROGRADE PYELOGRAM, URETEROSCOPY ;  Surgeon: Alexis Frock, MD;  Location: [facility];  Service: Urology;  Laterality: Bilateral;  ? CYSTOSCOPY WITH RETROGRADE PYELOGRAM, URETEROSCOPY AND STENT PLACEMENT Bilateral 01/06/2019  ? Procedure: CYSTOSCOPY WITH RETROGRADE PYELOGRAM, URETEROSCOPY AND STENT PLACEMENT, FIRST STAGE;  Surgeon: Alexis Frock, MD;  Location: [facility];  Service: Urology;  Laterality: Bilateral;  ? CYSTOSCOPY WITH RETROGRADE PYELOGRAM, URETEROSCOPY AND STENT PLACEMENT Bilateral 01/27/2019  ?  Procedure: CYSTOSCOPY WITH RETROGRADE PYELOGRAM, URETEROSCOPY AND STENT PLACEMENT;  Surgeon: Alexis Frock, MD;  Location: [facility];  Service: Urology;  Laterality: Bilateral;  75 MINS  ? CYSTOSCOPY WITH STENT PLACEMENT Bilateral 04/07/2013  ? Procedure: CYSTOSCOPY WITH STENT PLACEMENT;  Surgeon: Alexis Frock, MD;  Location: [facility];  Service: Urology;  Laterality: Bilateral;  ? DX LAPAROSCOPY W/ PERITONEAL AND OMENTAL BX'S AND WASHINGS  02-16-2008  ? EXCISION  RIGHT BREAST MASS  02-10-2002  ? EXP. LAP. EXTENSIVE ADHESIOLYSIS/ RESECTION TERMINAL ILEUM , ASCENDING AND DESCENDING COLON WITH CREATION ILEOSTOMY AND MUCOUS FISTULA  02-19-2008  ? PERFERATION AND ABD. CANCER--  TAKEDOWN ILEOSTOMY 08-23-2008  ? HOLMIUM LASER APPLICATION Bilateral 12/13/2503  ? Procedure: HOLMIUM LASER APPLICATION;  Surgeon: Alexis Frock, MD;  Location: [facility];  Service: Urology;  Laterality: Bilateral;  ? HOLMIUM LASER APPLICATION Bilateral 3/97/6734  ? Procedure: HOLMIUM LASER APPLICATION;  Surgeon: Alexis Frock, MD;  Location: [facility];  Service: Urology;  Laterality: Bilateral;  ? ILEOSTOMY CLOSURE  08/2008  ? TOTAL ABDOMINAL HYSTERECTOMY W/ BILATERAL SALPINGOOPHORECTOMY  1976  ? TOTAL ABDOMINAL HYSTERECTOMY W/ BILATERAL SALPINGOOPHORECTOMY  1976  ? VULVAR LESION REMOVAL N/A 08/22/2014  ? Procedure: EXCISION OF VULVAR CYST  ;  Surgeon: Lyman Speller, MD;  Location: [facility];  Service: Gynecology;  Laterality: N/A;  ? WOUND DEBRIDEMENT  05/28/2012  ? Procedure: DEBRIDEMENT ABDOMINAL WOUND;  Surgeon: Haywood Lasso, MD;  Location: [facility];  Service: General;  Laterality: N/A;  excision chronic wound abdominal wall  ? ?Social History:  reports that she has never smoked. She has never used smokeless tobacco. She reports that she does not drink alcohol and does not use drugs. ? ?Allergies  ?Allergen Reactions  ? Ramipril Other (See Comments)  ? ? ?Family History  ?Problem Relation Age of Onset  ? Heart disease Mother   ? Hyperlipidemia Mother   ? Breast cancer Sister   ? Cancer Sister   ?     breast  ? ? ?Prior to Admission medications   ?Medication Sig Start Date End Date Taking? Authorizing Provider  ?amLODipine (NORVASC) 10 MG tablet Take 1 tablet (10 mg total) by mouth daily. ?Patient taking differently: Take 10 mg by mouth daily with lunch. 06/11/17  Yes Belva Crome, MD  ?amoxicillin-clavulanate (AUGMENTIN) 875-125 MG tablet Take 1 tablet by mouth See admin  instructions. Bid x 10 days 01/30/22  Yes [provider]  ?anastrozole (ARIMIDEX) 1 MG tablet Take 1 tablet (1 mg total) by mouth daily. ?Patient taking differently: Take 1 mg by mouth daily with supper. 10/29/21  Yes Ladell Pier, MD  ?aspirin EC 81 MG tablet Take 81 mg by mouth daily.   Yes [provider]  ?BELSOMRA 10 MG TABS Take 10 mg by mouth at bedtime as needed (sleep).  01/20/19  Yes [provider]  ?Biotin 10000 MCG TABS Take 10,000 mcg by mouth daily with supper.   Yes [provider]  ?Calcium Carbonate-Vitamin D (CALTRATE 600+D PO) Take 1 tablet by mouth 2 (two) times daily.   Yes [provider]  ?carvedilol (COREG) 25 MG tablet Take 1 tablet (25 mg total) by mouth 2 (two) times daily with a meal. Please keep 7/23 appointment for additional refills thanks. 02/16/18  Yes Belva Crome, MD  ?chlorthalidone (HYGROTON) 25 MG tablet Take 0.5 tablets (12.5 mg total) by mouth daily. ?Patient taking differently: Take 12.5 mg by mouth in the morning and at bedtime. 02/15/21  Yes  Belva Crome, MD  ?Cholecalciferol (VITAMIN D3) 50 MCG (2000 UT) TABS Take 2,000 Units by mouth in the morning and at bedtime.   Yes [provider]  ?FEROSUL 325 (65 Fe) MG tablet Take 325 mg by mouth daily. 07/31/20  Yes [provider]  ?hydrALAZINE (APRESOLINE) 10 MG tablet Take 1 tablet (10 mg total) by mouth in the morning and at bedtime. 05/31/21  Yes Belva Crome, MD  ?hydroxychloroquine (PLAQUENIL) 200 MG tablet Take 200 mg by mouth daily. 11/08/21  Yes [provider]  ?isosorbide mononitrate (IMDUR) 60 MG 24 hr tablet TAKE 1 TABLET BY MOUTH DAILY. ?Patient taking differently: Take 60 mg by mouth daily. 02/28/21  Yes Belva Crome, MD  ?PROCTOZONE-HC 2.5 % rectal cream PLACE 1 APPLICATION RECTALLY 2 TIMES A DAY AS NEEDED FOR HEMORRHOIDS. ?Patient taking differently: Place 1 application. rectally 2 (two) times daily as needed for hemorrhoids. 01/09/15   Yes Cross, Melissa D, NP  ?traMADol (ULTRAM) 50 MG tablet Take 50 mg by mouth daily as needed for moderate pain. 02/26/21  Yes [provider]  ?triamcinolone (KENALOG) 0.1 % Apply 1 application. topically as need

## 2022-02-02 NOTE — Anesthesia Preprocedure Evaluation (Addendum)
Anesthesia Evaluation  ?Patient identified by MRN, date of birth, ID band ?Patient awake ? ? ? ?Reviewed: ?Allergy & Precautions, NPO status , Patient's Chart, lab work & pertinent test results ? ?Airway ?Mallampati: II ? ?TM Distance: >3 FB ?Neck ROM: Full ? ? ? Dental ? ?(+) Teeth Intact, Dental Advisory Given ?  ?Pulmonary ?neg pulmonary ROS,  ?  ?breath sounds clear to auscultation ? ? ? ? ? ? Cardiovascular ?hypertension,  ?Rhythm:Regular Rate:Normal ? ? ?  ?Neuro/Psych ?negative neurological ROS ? negative psych ROS  ? GI/Hepatic ?Neg liver ROS, GERD  ,  ?Endo/Other  ?negative endocrine ROS ? Renal/GU ?Renal disease  ? ?  ?Musculoskeletal ? ?(+) Arthritis ,  ? Abdominal ?Normal abdominal exam  (+)   ?Peds ? Hematology ?negative hematology ROS ?(+)   ?Anesthesia Other Findings ? ? Reproductive/Obstetrics ? ?  ? ? ? ? ? ? ? ? ? ? ? ? ? ?  ?  ? ? ? ? ? ? ? ?Anesthesia Physical ?Anesthesia Plan ? ?ASA: 3 ? ?Anesthesia Plan: General  ? ?Post-op Pain Management:   ? ?Induction: Intravenous ? ?PONV Risk Score and Plan: 3 and Ondansetron and Midazolam ? ?Airway Management Planned: LMA ? ?Additional Equipment: None ? ?Intra-op Plan:  ? ?Post-operative Plan: Extubation in OR ? ?Informed Consent: I have reviewed the patients History and Physical, chart, labs and discussed the procedure including the risks, benefits and alternatives for the proposed anesthesia with the patient or authorized representative who has indicated his/her understanding and acceptance.  ? ? ? ? ? ?Plan Discussed with: CRNA ? ?Anesthesia Plan Comments:   ? ? ? ? ? ?Anesthesia Quick Evaluation ? ?

## 2022-02-02 NOTE — Transfer of Care (Signed)
Immediate Anesthesia Transfer of Care Note ? ?Patient: [name] ? ?Procedure(s) Performed: IRRIGATION AND DEBRIDEMENT HAND (Right) ? ?Patient Location: PACU ? ?Anesthesia Type:General ? ?Level of Consciousness: awake, alert , oriented and patient cooperative ? ?Airway & Oxygen Therapy: Patient Spontanous Breathing ? ?Post-op Assessment: Report given to RN, Post -op Vital signs reviewed and stable and Patient moving all extremities X 4 ? ?Post vital signs: Reviewed and stable ? ?Last Vitals:  ?Vitals Value Taken Time  ?BP    ?Temp    ?Pulse 74 02/02/22 1438  ?Resp 11 02/02/22 1438  ?SpO2 95 % 02/02/22 1438  ?Vitals shown include unvalidated device data. ? ?Last Pain:  ?Vitals:  ? 02/02/22 1312  ?TempSrc:   ?PainSc: 0-No pain  ?   ? ?  ? ?Complications: No notable events documented. ?

## 2022-02-02 NOTE — Assessment & Plan Note (Signed)
Cont home BP meds ?Add PRN hydralazine ?

## 2022-02-02 NOTE — Assessment & Plan Note (Addendum)
From cat scratch. ?Pt confirms cat IS vaccinated to rabies. ?1. Failed outpt augmentin ?2. Empiric zosyn + vanc in ED ?3. Cont Zosyn ?4. Check MRSA PCR nares ?5. Add azithromycin for Bartonella coverage ?6. Hand surgery to see in consult, EDP spoke with Dr. Caralyn Guile ?1. NPO after MN ?

## 2022-02-02 NOTE — Plan of Care (Signed)
  Problem: Health Behavior/Discharge Planning: Goal: Ability to manage health-related needs will improve Outcome: Progressing   Problem: Clinical Measurements: Goal: Ability to maintain clinical measurements within normal limits will improve Outcome: Progressing   

## 2022-02-02 NOTE — Consult Note (Signed)
Reason for Consult:right hand infection ?Referring Physician: [facility] ? ?[name] is an 88 y.o. female.  ?HPI: [name] is a 88 year old female with past medical history significant for essential hypertension, breast cancer on Arimidex who presented to [facility] ED on 4/28 progressive with progressive pain and swelling to right hand.  Patient reports cat scratch several days ago, started Augmentin 2 days prior and despite antibiotic treatment, redness, swelling and pain has worsened.  Patient confirms that the cat was vaccinated to rabies and she is up-to-date on her immunizations. Pt here for surgery on right hand for abscess volar region of hand. ?  ? ?Past Medical History:  ?Diagnosis Date  ? Arthritis   ? HANDS AND BACK  ? 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 Breast cancer ([place]) 2010  ? RIGHT  ? CAD (coronary artery disease)   ? Dysuria   ? Family history of breast cancer   ? GERD (gastroesophageal reflux disease)   ? HX OF  ? Heart murmur   ? History of blood transfusion   ? History of breast cancer ONCOLOGIST-- DR Benay Spice  ? DX 2010--  RIGHT BREAST DCIS  HIGH GRADE S/P LUMPECTOMY W/ SLN BX--  NO RECURRENCE  ? History of kidney stones   ? History of ovarian cancer   ? 2009--  S/P COLON RESECTION FOR PAPILLARY SEROUS CARCINOMA, BORDERLINE OVARIAN CANCER VERSUS PRIMARY PERITONEAL CARCINOMA WITH PSAMMOMA BODIES--  NO RECURRENCE  ? History of skin cancer   ? excision basal cell  ? Hyperlipidemia   ? Hypertension   ? Kidney stones 7/14  ? Nocturia   ? Ovarian carcinoma ([place])   ? papillary serous-low grade, recurrence found in fat necrosis during ileostomy  ? Renal stones 2014  ? Shingles 30 YRS AGO  ? Ureteral calculi   ? bilateral  ? ? ?Past Surgical History:  ?Procedure Laterality Date  ? APPENDECTOMY    ? BREAST LUMPECTOMY Right 04-13-2009  ? W/ SLN BX  ? BREAST LUMPECTOMY WITH RADIOACTIVE SEED LOCALIZATION Left 06/11/2019  ? Procedure: LEFT BREAST LUMPECTOMY WITH RADIOACTIVE SEED  LOCALIZATION;  Surgeon: Jovita Kussmaul, MD;  Location: [place];  Service: General;  Laterality: Left;  ? CARDIAC CATHETERIZATION  11-11-2005 DR Daneen Schick  ? MODERATE LAD DISEASE/ NORMAL LVF/ EF 65-75%  ? CATARACT EXTRACTION W/ INTRAOCULAR LENS IMPLANT Bilateral 04/26/2013  ? CHOLECYSTECTOMY  1990  ? OPEN  ? COLONOSCOPY    ? CYSTOSCOPY W/ URETERAL STENT PLACEMENT Bilateral 05/07/2013  ? Procedure: CYSTOSCOPY WITH STENT REPLACEMENTS;  Surgeon: Alexis Frock, MD;  Location: [facility];  Service: Urology;  Laterality: Bilateral;  ? CYSTOSCOPY WITH BIOPSY N/A 04/07/2013  ? Procedure: CYSTOSCOPY WITH BIOPSY and fulgeration;  Surgeon: Alexis Frock, MD;  Location: [facility];  Service: Urology;  Laterality: N/A;  ? CYSTOSCOPY WITH RETROGRADE PYELOGRAM, URETEROSCOPY AND STENT PLACEMENT Bilateral 05/07/2013  ? Procedure: CYSTOSCOPY WITH RETROGRADE PYELOGRAM, URETEROSCOPY ;  Surgeon: Alexis Frock, MD;  Location[facility];  Service: Urology;  Laterality: Bilateral;  ? CYSTOSCOPY WITH RETROGRADE PYELOGRAM, URETEROSCOPY AND STENT PLACEMENT Bilateral 01/06/2019  ? Procedure: CYSTOSCOPY WITH RETROGRADE PYELOGRAM, URETEROSCOPY AND STENT PLACEMENT, FIRST STAGE;  Surgeon: Alexis Frock, MD;  Location: [facility];  Service: Urology;  Laterality: Bilateral;  ? CYSTOSCOPY WITH RETROGRADE PYELOGRAM, URETEROSCOPY AND STENT PLACEMENT Bilateral 01/27/2019  ? Procedure: CYSTOSCOPY WITH RETROGRADE PYELOGRAM, URETEROSCOPY AND STENT PLACEMENT;  Surgeon: Alexis Frock, MD;  Location: [facility];  Service: Urology;  Laterality: Bilateral;  75 MINS  ? CYSTOSCOPY WITH STENT PLACEMENT Bilateral 04/07/2013  ? Procedure: CYSTOSCOPY WITH STENT PLACEMENT;  Surgeon: Alexis Frock, MD;  Location: [facility];  Service: Urology;  Laterality: Bilateral;  ? DX LAPAROSCOPY W/ PERITONEAL AND OMENTAL BX'S AND WASHINGS  02-16-2008  ? EXCISION RIGHT BREAST MASS  02-10-2002  ? EXP. LAP. EXTENSIVE ADHESIOLYSIS/ RESECTION TERMINAL ILEUM , ASCENDING  AND DESCENDING COLON WITH CREATION ILEOSTOMY AND MUCOUS FISTULA  02-19-2008  ? PERFERATION AND ABD. CANCER--  TAKEDOWN ILEOSTOMY 08-23-2008  ? HOLMIUM LASER APPLICATION Bilateral 10/10/863  ? Procedure: HOLMIUM LASER APPLICATION;  Surgeon: Alexis Frock, MD;  Location[facility];  Service: Urology;  Laterality: Bilateral;  ? HOLMIUM LASER APPLICATION Bilateral 7/84/6962  ? Procedure: HOLMIUM LASER APPLICATION;  Surgeon: Alexis Frock, MD;  Location: [facility];  Service: Urology;  Laterality: Bilateral;  ? ILEOSTOMY CLOSURE  08/2008  ? TOTAL ABDOMINAL HYSTERECTOMY W/ BILATERAL SALPINGOOPHORECTOMY  1976  ? TOTAL ABDOMINAL HYSTERECTOMY W/ BILATERAL SALPINGOOPHORECTOMY  1976  ? VULVAR LESION REMOVAL N/A 08/22/2014  ? Procedure: EXCISION OF VULVAR CYST  ;  Surgeon: Lyman Speller, MD;  Location: [facility];  Service: Gynecology;  Laterality: N/A;  ? WOUND DEBRIDEMENT  05/28/2012  ? Procedure: DEBRIDEMENT ABDOMINAL WOUND;  Surgeon: Haywood Lasso, MD;  Location: [place];  Service: General;  Laterality: N/A;  excision chronic wound abdominal wall  ? ? ?Family History  ?Problem Relation Age of Onset  ? Heart disease Mother   ? Hyperlipidemia Mother   ? Breast cancer Sister   ? Cancer Sister   ?     breast  ? ? ?Social History:  reports that she has never smoked. She has never used smokeless tobacco. She reports that she does not drink alcohol and does not use drugs. ? ?Allergies:  ?Allergies  ?Allergen Reactions  ? Ramipril Other (See Comments)  ? ? ?Medications: I have reviewed the patient's current medications. ? ?Results for orders placed or performed during the hospital encounter of 02/01/22 (from the past 48 hour(s))  ?Lactic acid, plasma     Status: None  ? Collection Time: 02/01/22  2:15 AM  ?Result Value Ref Range  ? Lactic Acid, Venous 1.0 0.5 - 1.9 mmol/L  ?  Comment: Performed at [facility], [place] [address] [place], [place] [address]  ?Lactic acid, plasma     Status: None  ? Collection  Time: 02/01/22  4:41 PM  ?Result Value Ref Range  ? Lactic Acid, Venous 1.2 0.5 - 1.9 mmol/L  ?  Comment: Performed at [facility], [place] [address], [place], [place] [address]  ?Comprehensive metabolic panel     Status: Abnormal  ? Collection Time: 02/01/22  4:41 PM  ?Result Value Ref Range  ? Sodium 141 135 - 145 mmol/L  ? Potassium 3.8 3.5 - 5.1 mmol/L  ? Chloride 111 98 - 111 mmol/L  ? CO2 22 22 - 32 mmol/L  ? Glucose, Bld 101 (H) 70 - 99 mg/dL  ?  Comment: Glucose reference range applies only to samples taken after fasting for at least 8 hours.  ? BUN 30 (H) 8 - 23 mg/dL  ? Creatinine, Ser 1.19 (H) 0.44 - 1.00 mg/dL  ? Calcium 9.5 8.9 - 10.3 mg/dL  ? Total Protein 6.6 6.5 - 8.1 g/dL  ? Albumin 3.7 3.5 - 5.0 g/dL  ? AST 14 (L) 15 - 41 U/L  ? ALT 14 0 - 44 U/L  ? Alkaline Phosphatase 97  38 - 126 U/L  ? Total Bilirubin 0.7 0.3 - 1.2 mg/dL  ? GFR, Estimated 45 (L) >60 mL/min  ?  Comment: (NOTE) ?Calculated using the CKD-EPI Creatinine Equation (2021) ?  ? Anion gap 8 5 - 15  ?  Comment: Performed at [facility], [place] [address] [place], [place] [address]  ?CBC with Differential     Status: Abnormal  ? Collection Time: 02/01/22  4:41 PM  ?Result Value Ref Range  ? WBC 11.5 (H) 4.0 - 10.5 K/uL  ? RBC 4.21 3.87 - 5.11 MIL/uL  ? Hemoglobin 13.3 12.0 - 15.0 g/dL  ? HCT 40.2 36.0 - 46.0 %  ? MCV 95.5 80.0 - 100.0 fL  ? MCH 31.6 26.0 - 34.0 pg  ? MCHC 33.1 30.0 - 36.0 g/dL  ? RDW 14.2 11.5 - 15.5 %  ? Platelets 537 (H) 150 - 400 K/uL  ? nRBC 0.0 0.0 - 0.2 %  ? Neutrophils Relative % 78 %  ? Neutro Abs 9.1 (H) 1.7 - 7.7 K/uL  ? Lymphocytes Relative 9 %  ? Lymphs Abs 1.0 0.7 - 4.0 K/uL  ? Monocytes Relative 6 %  ? Monocytes Absolute 0.7 0.1 - 1.0 K/uL  ? Eosinophils Relative 4 %  ? Eosinophils Absolute 0.4 0.0 - 0.5 K/uL  ? Basophils Relative 2 %  ? Basophils Absolute 0.2 (H) 0.0 - 0.1 K/uL  ? Immature Granulocytes 1 %  ? Abs Immature Granulocytes 0.08 (H) 0.00 - 0.07 K/uL  ?  Comment: Performed at [facility], [place] [address], [place], [place] [address]  ?Surgical pcr screen     Status: None  ? Collection Time: 02/02/22  7:10 AM  ? Specimen: Nasal Mucosa; Nasal Swab  ?Result Value Ref Range  ? MRSA, PCR N

## 2022-02-02 NOTE — Op Note (Signed)
PREOPERATIVE DIAGNOSIS: Right hand cat bite with palmar abscess ? ?POSTOPERATIVE DIAGNOSIS: Same ? ?ATTENDING SURGEON: Dr. Iran Planas who scrubbed and present the entire procedure ? ?ASSISTANT SURGEON: None ? ?ANESTHESIA: General via LMA ? ?OPERATIVE PROCEDURE: Right hand drainage of cat bite, palmar bursa incision and drainage right hand ? ?IMPLANTS: None ? ?EBL: Minimal ? ?RADIOGRAPHIC INTERPRETATION: None ? ?SURGICAL INDICATIONS: Patient is a right-hand-dominant female who sustained a cat scratch and developed worsening pain and swelling.  Patient was seen and evaluated in the hospital and recommended undergo the above procedure.  Risks of surgery include but not limited to bleeding infection damage nearby nerves arteries or tendons both of motion of the wrist and digits incomplete relief of symptoms and need for further surgical invention.  Signed informed consent was obtained on the day of surgery. ? ?SURGICAL TECHNIQUE: The patient was palpated find the preoperative holding area marked apart a marker made on the right and indicate correct operative site.  Patient brought back operating placed supine on the anesthesia table where the general anesthetic was administered.  Patient tolerates well.  Well-padded tourniquet placed on the right brachium and sealed with the appropriate drape.  The right upper extremities then prepped and draped normal sterile fashion.  A timeout was called the correct site was identified procedure then begun.  Attention was then turned to the right hand between the index and thumb webspace a oblique incision was then made directly over the abscess region.  Dissection carried down through the skin and subcutaneous tissue where the abscess region was then decompressed.  Thorough decompression of the abscess or palmar bursa was then done.  Thorough wound irrigation done throughout.  Following irrigation and drainage the wound was then loosely closed with simple 4-0 nylon sutures.   Adaptic dressing a sterile compressive bandage then applied.  Patient tolerated the procedure well taken recovery room in good condition.  Cultures were taken aerobic and anaerobic cultures. ? ?POSTOPERATIVE PLAN: The patient will be admitted back to the [facility] hospitalist service.  I think is very reasonable for the patient to go home tomorrow on oral antibiotics.  Likely regimen of Augmentin.  I will be happy to see her back in the office on Friday of this week she needs to keep the bandage on until I see her back in the office. ?

## 2022-02-02 NOTE — Plan of Care (Signed)
  Problem: Education: Goal: Knowledge of General Education information will improve Description: Including pain rating scale, medication(s)/side effects and non-pharmacologic comfort measures Outcome: Progressing   Problem: Activity: Goal: Risk for activity intolerance will decrease Outcome: Progressing   Problem: Coping: Goal: Level of anxiety will decrease Outcome: Progressing   

## 2022-02-02 NOTE — Progress Notes (Signed)
Pt arrived to [facility] at 0659 am. ?Pt alert and oriented x4, identified appropriately, denied chest pain and SOB. No signs of acute distress. ?Pt oriented to room and equipment, instructed to call for assistance, and how to use call bell.  ?Call bell left within pt reach. ?Pt ambulatory with steady gait. ?

## 2022-02-02 NOTE — ED Provider Notes (Signed)
?[facility] ?Provider Note ? ? ?CSN: [id number] ?Arrival date & time: 02/01/22  1613 ? ?  ? ?History ? ?Chief Complaint  ?Patient presents with  ? cat scratch  ? ? ?[name] is a 88 y.o. female. ? ?HPI ? ?Patient with medical history including hypertension, GERD, hyperlipidemia, CAD presents with complaints of a cat scratch.  Patient states that on Saturday she was scratched on the right hand by a friend's cat.  States that she had some redness and swelling around the base of the right thumb but has gotten worse, she went to her primary care doctor who started her on Augmentin she started this on Wednesday has had 2 days of it but the swelling has gotten worse and slightly more painful.  She denies any streaking up her arm fevers chills chest pain general body aches.  She is not immunocompromise, nondiabetic up-to-date on all childhood vaccines. ? ?Home Medications ?Prior to Admission medications   ?Medication Sig Start Date End Date Taking? Authorizing Provider  ?amLODipine (NORVASC) 10 MG tablet Take 1 tablet (10 mg total) by mouth daily. ?Patient taking differently: Take 10 mg by mouth daily with lunch. 06/11/17  Yes Belva Crome, MD  ?amoxicillin-clavulanate (AUGMENTIN) 875-125 MG tablet Take 1 tablet by mouth See admin instructions. Bid x 10 days 01/30/22  Yes [provider]  ?anastrozole (ARIMIDEX) 1 MG tablet Take 1 tablet (1 mg total) by mouth daily. ?Patient taking differently: Take 1 mg by mouth daily with supper. 10/29/21  Yes Ladell Pier, MD  ?aspirin EC 81 MG tablet Take 81 mg by mouth daily.   Yes [provider]  ?BELSOMRA 10 MG TABS Take 10 mg by mouth at bedtime as needed (sleep).  01/20/19  Yes [provider]  ?Biotin 10000 MCG TABS Take 10,000 mcg by mouth daily with supper.   Yes [provider]  ?Calcium Carbonate-Vitamin D (CALTRATE 600+D PO) Take 1 tablet by mouth 2 (two) times daily.   Yes [provider]  ?carvedilol (COREG) 25 MG tablet Take 1 tablet (25 mg total) by mouth 2 (two) times daily with a meal. Please keep 7/23 appointment for additional refills thanks. 02/16/18  Yes Belva Crome, MD  ?chlorthalidone (HYGROTON) 25 MG tablet Take 0.5 tablets (12.5 mg total) by mouth daily. ?Patient taking differently: Take 12.5 mg by mouth in the morning and at bedtime. 02/15/21  Yes Belva Crome, MD  ?Cholecalciferol (VITAMIN D3) 50 MCG (2000 UT) TABS Take 2,000 Units by mouth in the morning and at bedtime.   Yes [provider]  ?FEROSUL 325 (65 Fe) MG tablet Take 325 mg by mouth daily. 07/31/20  Yes [provider]  ?hydrALAZINE (APRESOLINE) 10 MG tablet Take 1 tablet (10 mg total) by mouth in the morning and at bedtime. 05/31/21  Yes Belva Crome, MD  ?hydroxychloroquine (PLAQUENIL) 200 MG tablet Take 200 mg by mouth daily. 11/08/21  Yes [provider]  ?isosorbide mononitrate (IMDUR) 60 MG 24 hr tablet TAKE 1 TABLET BY MOUTH DAILY. ?Patient taking differently: Take 60 mg by mouth daily. 02/28/21  Yes Belva Crome, MD  ?PROCTOZONE-HC 2.5 % rectal cream PLACE 1 APPLICATION RECTALLY 2 TIMES A DAY AS NEEDED FOR HEMORRHOIDS. ?Patient taking differently: Place 1 application. rectally 2 (two) times daily as needed for hemorrhoids. 01/09/15  Yes Cross, Melissa D, NP  ?traMADol (ULTRAM) 50 MG tablet Take 50 mg by mouth daily as needed for moderate  pain. 02/26/21  Yes [provider]  ?triamcinolone (KENALOG) 0.1 % Apply 1 application. topically as needed (rash). 04/04/20  Yes [provider]  ?   ? ?Allergies    ?Ramipril   ? ?Review of Systems   ?Review of Systems  ?Constitutional:  Negative for chills and fever.  ?Respiratory:  Negative for shortness of breath.   ?Cardiovascular:  Negative for chest pain.  ?Gastrointestinal:  Negative for abdominal pain.  ?Skin:  Positive for wound.  ?Neurological:  Negative for headaches.  ? ?Physical Exam ?Updated Vital Signs ?BP  (!) 200/86   Pulse 65   Temp 98.7 ?F (37.1 ?C)   Resp 16   Ht '5\' 6"'$  (1.676 m)   Wt 55.6 kg   LMP 10/07/1974   SpO2 96%   BMI 19.78 kg/m?  ?Physical Exam ?Vitals and nursing note reviewed.  ?Constitutional:   ?   General: She is not in acute distress. ?   Appearance: She is not ill-appearing.  ?HENT:  ?   Head: Normocephalic and atraumatic.  ?   Nose: No congestion.  ?Eyes:  ?   Conjunctiva/sclera: Conjunctivae normal.  ?Cardiovascular:  ?   Rate and Rhythm: Normal rate and regular rhythm.  ?   Pulses: Normal pulses.  ?   Heart sounds: No murmur heard. ?  No friction rub. No gallop.  ?Pulmonary:  ?   Effort: No respiratory distress.  ?   Breath sounds: No wheezing, rhonchi or rales.  ?Musculoskeletal:  ?   Comments: Right hand was visualized she has noted healing lacerations on the palmar and dorsum aspect of the hand mainly around the MP joints of the right thumb, she has large amount of erythema between the web of the thumb and the index finger fluctuance induration present, she has decreased range of motion of the right thumb neurovascular fully intact please see picture for full detail ?  ?Skin: ?   General: Skin is warm and dry.  ?Neurological:  ?   Mental Status: She is alert.  ?Psychiatric:     ?   Mood and Affect: Mood normal.  ? ? ? ? ? ? ? ? ? ?ED Results / Procedures / Treatments   ?Labs ?(all labs ordered are listed, but only abnormal results are displayed) ?Labs Reviewed  ?COMPREHENSIVE METABOLIC PANEL - Abnormal; Notable for the following components:  ?    Result Value  ? Glucose, Bld 101 (*)   ? BUN 30 (*)   ? Creatinine, Ser 1.19 (*)   ? AST 14 (*)   ? GFR, Estimated 45 (*)   ? All other components within normal limits  ?CBC WITH DIFFERENTIAL/PLATELET - Abnormal; Notable for the following components:  ? WBC 11.5 (*)   ? Platelets 537 (*)   ? Neutro Abs 9.1 (*)   ? Basophils Absolute 0.2 (*)   ? Abs Immature Granulocytes 0.08 (*)   ? All other components within normal limits  ?CULTURE, BLOOD  (ROUTINE X 2)  ?CULTURE, BLOOD (ROUTINE X 2)  ?MRSA NEXT GEN BY PCR, NASAL  ?LACTIC ACID, PLASMA  ?LACTIC ACID, PLASMA  ? ? ?EKG ?None ? ?Radiology ?DG Hand Complete Right ? ?Result Date: 02/01/2022 ?CLINICAL DATA:  Status post recent cat scratch with subsequent redness and swelling. EXAM: RIGHT HAND - COMPLETE 3+ VIEW COMPARISON:  None. FINDINGS: There is no evidence of an acute fracture or dislocation. Marked severity degenerative changes are seen involving the carpometacarpal and interphalangeal joints of the right thumb,  and the D IP joints of the second and third right fingers. Mild soft tissue swelling is seen involving the second and third right fingers. IMPRESSION: 1. Marked severity degenerative changes. 2. Soft tissue swelling involving the second and third right fingers without an acute osseous abnormality. Electronically Signed   By: Virgina Norfolk M.D.   On: 02/01/2022 17:18   ? ?Procedures ?Procedures  ? ? ?Medications Ordered in ED ?Medications  ?vancomycin (VANCOCIN) IVPB 1000 mg/200 mL premix (1,000 mg Intravenous New Bag/Given 02/02/22 0231)  ?piperacillin-tazobactam (ZOSYN) IVPB 3.375 g (3.375 g Intravenous New Bag/Given 02/02/22 0228)  ?azithromycin (ZITHROMAX) tablet 500 mg (has no administration in time range)  ?acetaminophen (TYLENOL) tablet 650 mg (has no administration in time range)  ?  Or  ?acetaminophen (TYLENOL) suppository 650 mg (has no administration in time range)  ?ondansetron (ZOFRAN) tablet 4 mg (has no administration in time range)  ?  Or  ?ondansetron (ZOFRAN) injection 4 mg (has no administration in time range)  ?amLODipine (NORVASC) tablet 10 mg (has no administration in time range)  ?anastrozole (ARIMIDEX) tablet 1 mg (has no administration in time range)  ?Suvorexant TABS 10 mg (has no administration in time range)  ?carvedilol (COREG) tablet 25 mg (has no administration in time range)  ?hydrALAZINE (APRESOLINE) tablet 10 mg (has no administration in time range)   ?hydroxychloroquine (PLAQUENIL) tablet 200 mg (has no administration in time range)  ?isosorbide mononitrate (IMDUR) 24 hr tablet 60 mg (has no administration in time range)  ?chlorthalidone (HYGROTON) tablet 12.5 mg (has no admi

## 2022-02-02 NOTE — ED Notes (Signed)
Patient to 5N at this time.  ?

## 2022-02-02 NOTE — Progress Notes (Signed)
Pt is back from her surgery. VS WDL. R hand dressing is clean, dry, intact.  ?

## 2022-02-02 NOTE — Anesthesia Postprocedure Evaluation (Signed)
Anesthesia Post Note ? ?Patient: [name] ? ?Procedure(s) Performed: IRRIGATION AND DEBRIDEMENT HAND (Right) ? ?  ? ?Patient location during evaluation: PACU ?Anesthesia Type: General ?Level of consciousness: awake and alert ?Pain management: pain level controlled ?Vital Signs Assessment: post-procedure vital signs reviewed and stable ?Respiratory status: spontaneous breathing, nonlabored ventilation, respiratory function stable and patient connected to nasal cannula oxygen ?Cardiovascular status: blood pressure returned to baseline and stable ?Postop Assessment: no apparent nausea or vomiting ?Anesthetic complications: no ? ? ?No notable events documented. ? ?Last Vitals:  ?Vitals:  ? 02/02/22 1520 02/02/22 1538  ?BP: (!) 130/52 (!) 143/51  ?Pulse: 61 65  ?Resp: 12 16  ?Temp:  36.5 ?C  ?SpO2: 93% 98%  ?  ?Last Pain:  ?Vitals:  ? 02/02/22 1538  ?TempSrc: Oral  ?PainSc:   ? ? ?  ?  ?  ?  ?  ?  ? ?Effie Berkshire ? ? ? ? ?

## 2022-02-02 NOTE — Progress Notes (Signed)
?PROGRESS NOTE ? ? ? ?[name]  ZYS:[id number] DOB: [birth year] DOA: 02/01/2022 ?PCP: Leeroy Cha, MD  ? ? ?Brief Narrative:  ? ?[name] is a 88 year old female with past medical history significant for essential hypertension, breast cancer on Arimidex who presented to [facility] ED on 4/28 progressive with progressive pain and swelling to right hand.  Patient reports cat scratch several days ago, started Augmentin 2 days prior and despite antibiotic treatment, redness, swelling and pain has worsened.  Patient confirms that the cat was vaccinated to rabies and she is up-to-date on her immunizations. ? ?In the ED, temperature 98.7 ?F, HR 62, RR 16, BP 177/52, SPO2 98% on room air.  WBC 11.5, hemoglobin 13.3, platelet count 537.  Sodium 141, potassium 3.8, chloride 111, CO2 22, glucose 101, BUN 30, creatinine 1.19.  Lactic acid 1.2.  MRSA PCR negative.  Right hand x-ray with marked severity degenerative changes, soft tissue swelling involving the second and third right fingers without an acute osseous abnormality.  Blood cultures x2 obtained.  Orthopedic hand surgery was consulted.  Hospital service consulted for further evaluation management of right hand infection secondary to cat scratch. ? ?Assessment & Plan: ?  ?Right hand infection  ?Patient presenting to ED with progressive erythema, swelling, pain to right hand following scat scratch several days prior; despite being on Augmentin last 2 days.  Patient is afebrile with mild leukocytosis of 11.5.  Right hand x-ray with soft tissue swelling second/third right fingers. ?--Orthopedics following, appreciate assistance ?--Blood cultures x2: Pending ?--Augmentin 500 mg PO every 24 hours ?--Zosyn 3.375g IV q8h ?--Tylenol/tramadol as needed pain control ?--N.p.o. for planned surgical intervention today ?--CBC daily ? ?Essential hypertension ?--Amlodipine 10 mg p.o. daily ?--Carvedilol 25 mg p.o. twice daily ?--Chlorthalidone 12.5 mg p.o. twice  daily ?--Isosorbide mononitrate 60 mg p.o. daily ? ?Breast cancer ?Follows with medical oncology outpatient, Dr. Benay Spice.  Continue outpatient follow-up as scheduled. ? ? ? ?DVT prophylaxis: SCDs Start: 02/02/22 0214 ? ?  Code Status: Full Code ?Family Communication: No family present at bedside this morning ? ?Disposition Plan:  ?Level of care: Med-Surg ?Status is: Observation ?The patient remains OBS appropriate and will d/c before 2 midnights. ?  ? ?Consultants:  ?Orthopedic hand surgery, Dr. Caralyn Guile ? ?Procedures:  ?Pending irrigation and debridement right hand ? ?Antimicrobials:  ?Azithromycin 4/29 ?Zosyn 4/29 ? ? ?Subjective: ?Patient seen examined bedside, resting comfortably.  Continues with mild pain, swelling, erythema to right hand.  Orthopedics planned surgical intervention today.  No other questions or concerns at this time.  Denies headache, no chest pain, no palpitations, no shortness of breath, no abdominal pain, no fever/chills/night sweats, no nausea/vomiting/diarrhea, no weakness, no fatigue, no paresthesias.  No acute events overnight per nursing staff. ? ?Objective: ?Vitals:  ? 02/02/22 0600 02/02/22 0630 02/02/22 0830 02/02/22 1051  ?BP: (!) 157/63 (!) 154/62 (!) 159/57 (!) 113/51  ?Pulse: 74 67 71 66  ?Resp:   18 20  ?Temp:   97.9 ?F (36.6 ?C) 97.8 ?F (36.6 ?C)  ?TempSrc:   Oral Oral  ?SpO2: 96% 95% 96% 95%  ?Weight:      ?Height:      ? ? ?Intake/Output Summary (Last 24 hours) at 02/02/2022 1142 ?Last data filed at 02/02/2022 1020 ?Gross per 24 hour  ?Intake 0 ml  ?Output --  ?Net 0 ml  ? ?Filed Weights  ? 02/01/22 1625  ?Weight: 55.6 kg  ? ? ?Examination: ? ?Physical Exam: ?GEN: NAD, alert and oriented x 3,  wd/wn ?HEENT: NCAT, PERRL, EOMI, sclera clear, MMM ?PULM: CTAB w/o wheezes/crackles, normal respiratory effort, on room air ?CV: RRR w/o M/G/R ?GI: abd soft, NTND, NABS, no R/G/M ?MSK: no peripheral edema, muscle strength globally intact 5/5 bilateral upper/lower extremities ?NEURO: CN  II-XII intact, no focal deficits, sensation to light touch intact ?PSYCH: normal mood/affect ?Integumentary: Erythema, edema and swelling noted to right hand as depicted below otherwise no other concerning rashes/lesions/wounds.   ? ? ? ? ? ? ? ? ? ? ?Data Reviewed: I have personally reviewed following labs and imaging studies ? ?CBC: ?Recent Labs  ?Lab 02/01/22 ?1641  ?WBC 11.5*  ?NEUTROABS 9.1*  ?HGB 13.3  ?HCT 40.2  ?MCV 95.5  ?PLT 537*  ? ?Basic Metabolic Panel: ?Recent Labs  ?Lab 02/01/22 ?1641  ?NA 141  ?K 3.8  ?CL 111  ?CO2 22  ?GLUCOSE 101*  ?BUN 30*  ?CREATININE 1.19*  ?CALCIUM 9.5  ? ?GFR: ?Estimated Creatinine Clearance: 30.3 mL/min (A) (by C-G formula based on SCr of 1.19 mg/dL (H)). ?Liver Function Tests: ?Recent Labs  ?Lab 02/01/22 ?1641  ?AST 14*  ?ALT 14  ?ALKPHOS 97  ?BILITOT 0.7  ?PROT 6.6  ?ALBUMIN 3.7  ? ?No results for input(s): LIPASE, AMYLASE in the last 168 hours. ?No results for input(s): AMMONIA in the last 168 hours. ?Coagulation Profile: ?No results for input(s): INR, PROTIME in the last 168 hours. ?Cardiac Enzymes: ?No results for input(s): CKTOTAL, CKMB, CKMBINDEX, TROPONINI in the last 168 hours. ?BNP (last 3 results) ?No results for input(s): PROBNP in the last 8760 hours. ?HbA1C: ?No results for input(s): HGBA1C in the last 72 hours. ?CBG: ?No results for input(s): GLUCAP in the last 168 hours. ?Lipid Profile: ?No results for input(s): CHOL, HDL, LDLCALC, TRIG, CHOLHDL, LDLDIRECT in the last 72 hours. ?Thyroid Function Tests: ?No results for input(s): TSH, T4TOTAL, FREET4, T3FREE, THYROIDAB in the last 72 hours. ?Anemia Panel: ?No results for input(s): VITAMINB12, FOLATE, FERRITIN, TIBC, IRON, RETICCTPCT in the last 72 hours. ?Sepsis Labs: ?Recent Labs  ?Lab 02/01/22 ?0215 02/01/22 ?1641  ?LATICACIDVEN 1.0 1.2  ? ? ?Recent Results (from the past 240 hour(s))  ?Surgical pcr screen     Status: None  ? Collection Time: 02/02/22  7:10 AM  ? Specimen: Nasal Mucosa; Nasal Swab  ?Result  Value Ref Range Status  ? MRSA, PCR NEGATIVE NEGATIVE Final  ? Staphylococcus aureus NEGATIVE NEGATIVE Final  ?  Comment: (NOTE) ?The Xpert SA Assay (FDA approved for NASAL specimens in patients 39 ?years of age and older), is one component of a comprehensive ?surveillance program. It is not intended to diagnose infection nor to ?guide or monitor treatment. ?Performed at [facility], [place] [address], [place], [place] ?[address] ?  ?  ? ? ? ? ? ?Radiology Studies: ?DG Hand Complete Right ? ?Result Date: 02/01/2022 ?CLINICAL DATA:  Status post recent cat scratch with subsequent redness and swelling. EXAM: RIGHT HAND - COMPLETE 3+ VIEW COMPARISON:  None. FINDINGS: There is no evidence of an acute fracture or dislocation. Marked severity degenerative changes are seen involving the carpometacarpal and interphalangeal joints of the right thumb, and the D IP joints of the second and third right fingers. Mild soft tissue swelling is seen involving the second and third right fingers. IMPRESSION: 1. Marked severity degenerative changes. 2. Soft tissue swelling involving the second and third right fingers without an acute osseous abnormality. Electronically Signed   By: Virgina Norfolk M.D.   On: 02/01/2022 17:18   ? ? ? ? ? ?  Scheduled Meds: ? amLODipine  10 mg Oral Q lunch  ? anastrozole  1 mg Oral Q supper  ? azithromycin  500 mg Oral Daily  ? carvedilol  25 mg Oral BID WC  ? chlorhexidine  60 mL Topical Once  ? chlorthalidone  12.5 mg Oral BID  ? hydrALAZINE  10 mg Oral BID  ? hydroxychloroquine  200 mg Oral Daily  ? isosorbide mononitrate  60 mg Oral Daily  ? povidone-iodine  2 application. Topical Once  ? ?Continuous Infusions: ? piperacillin-tazobactam (ZOSYN)  IV 3.375 g (02/02/22 8242)  ? ? ? LOS: 0 days  ? ? ?Time spent: 51 minutes spent on chart review, discussion with nursing staff, consultants, updating family and interview/physical exam; more than 50% of that time was spent in counseling and/or coordination  of care. ? ? ? ?Riyah Bardon J [place] DO ?[facility] ?Available via Epic secure chat 7am-7pm ?After these hours, please refer to coverage provider listed on [email or web address] ?02/02/2022, 11:42 AM  ? ?

## 2022-02-02 NOTE — Anesthesia Procedure Notes (Signed)
Procedure Name: LMA Insertion ?Date/Time: 02/02/2022 2:06 PM ?Performed by: Claris Che, CRNA ?Pre-anesthesia Checklist: Patient identified, Emergency Drugs available, Suction available, Patient being monitored and Timeout performed ?Patient Re-evaluated:Patient Re-evaluated prior to induction ?Oxygen Delivery Method: Circle system utilized ?Preoxygenation: Pre-oxygenation with 100% oxygen ?Induction Type: IV induction ?Ventilation: Mask ventilation without difficulty ?LMA: LMA inserted ?LMA Size: 4.0 ?Number of attempts: 1 ?Placement Confirmation: positive ETCO2 and breath sounds checked- equal and bilateral ?Dental Injury: Teeth and Oropharynx as per pre-operative assessment  ? ? ? ? ?

## 2022-02-02 NOTE — ED Notes (Signed)
Patient ambulated to the bathroom with a steady gait

## 2022-02-02 NOTE — Assessment & Plan Note (Signed)
Continue Arimidex 

## 2022-02-03 ENCOUNTER — Encounter (HOSPITAL_COMMUNITY): Payer: Self-pay | Admitting: Orthopedic Surgery

## 2022-02-03 DIAGNOSIS — L089 Local infection of the skin and subcutaneous tissue, unspecified: Secondary | ICD-10-CM | POA: Diagnosis not present

## 2022-02-03 LAB — BASIC METABOLIC PANEL
Anion gap: 9 (ref 5–15)
BUN: 35 mg/dL — ABNORMAL HIGH (ref 8–23)
CO2: 21 mmol/L — ABNORMAL LOW (ref 22–32)
Calcium: 8.5 mg/dL — ABNORMAL LOW (ref 8.9–10.3)
Chloride: 108 mmol/L (ref 98–111)
Creatinine, Ser: 1.79 mg/dL — ABNORMAL HIGH (ref 0.44–1.00)
GFR, Estimated: 27 mL/min — ABNORMAL LOW (ref >60)
Glucose, Bld: 121 mg/dL — ABNORMAL HIGH (ref 70–99)
Potassium: 3.2 mmol/L — ABNORMAL LOW (ref 3.5–5.1)
Sodium: 138 mmol/L (ref 135–145)

## 2022-02-03 LAB — CBC
HCT: 34.9 % — ABNORMAL LOW (ref 36.0–46.0)
Hemoglobin: 12 g/dL (ref 12.0–15.0)
MCH: 32.2 pg (ref 26.0–34.0)
MCHC: 34.4 g/dL (ref 30.0–36.0)
MCV: 93.6 fL (ref 80.0–100.0)
Platelets: 420 10*3/uL — ABNORMAL HIGH (ref 150–400)
RBC: 3.73 MIL/uL — ABNORMAL LOW (ref 3.87–5.11)
RDW: 14.3 % (ref 11.5–15.5)
WBC: 11.9 10*3/uL — ABNORMAL HIGH (ref 4.0–10.5)
nRBC: 0 % (ref 0.0–0.2)

## 2022-02-03 MED ORDER — AMOXICILLIN-POT CLAVULANATE 875-125 MG PO TABS: 1.0000 | ORAL_TABLET | Freq: Two times a day (BID) | ORAL | 0 refills | Status: AC

## 2022-02-03 MED ORDER — AZITHROMYCIN 500 MG PO TABS: 500.0000 mg | ORAL_TABLET | Freq: Every day | ORAL | 0 refills | Status: AC

## 2022-02-03 MED ORDER — POTASSIUM CHLORIDE CRYS ER 20 MEQ PO TBCR
40.0000 meq | EXTENDED_RELEASE_TABLET | ORAL | Status: DC
  Administered 2022-02-03: 40 meq via ORAL
  Filled 2022-02-03: qty 2

## 2022-02-03 MED ORDER — AMPICILLIN-SULBACTAM SODIUM 3 (2-1) G IJ SOLR
3.0000 g | Freq: Two times a day (BID) | INTRAMUSCULAR | Status: DC
  Administered 2022-02-03: 3 g via INTRAVENOUS
  Filled 2022-02-03: qty 8

## 2022-02-03 NOTE — Discharge Summary (Signed)
?Physician Discharge Summary  ?[name] Bold MLY:[id number] DOB: 09-Sep-1934 DOA: 02/01/2022 ? ?PCP: Alison Cha, MD ? ?Admit date: 02/01/2022 ?Discharge date: 02/03/2022 ? ?Admitted From: Home ?Disposition: Home ? ?Recommendations for Outpatient Follow-up:  ?Follow up with PCP in 1-2 weeks ?Follow-up with orthopedics, [name] on 02/08/2022 ?Continue antibiotics with azithromycin to complete 5-day course given cat scratch ?Continue antibiotics with Augmentin to complete 14-day course ?Please obtain BMP in one week to assess renal function ?Please follow up on the following pending results: Finalized operative cultures that were pending at time of discharge ? ?Home Health: No ?Equipment/Devices: None ? ?Discharge Condition: Stable ?CODE STATUS: Full code ?Diet recommendation: Heart healthy diet ? ?History of present illness: ? ?[name] is a 88 year old female with past medical history significant for essential hypertension, breast cancer on Arimidex who presented to [facility] ED on 4/28 progressive with progressive pain and swelling to right hand.  Patient reports cat scratch several days ago, started Augmentin 2 days prior and despite antibiotic treatment, redness, swelling and pain has worsened.  Patient confirms that the cat was vaccinated to rabies and she is up-to-date on her immunizations. ?  ?In the ED, temperature 98.7 ?F, HR 62, RR 16, BP 177/52, SPO2 98% on room air.  WBC 11.5, hemoglobin 13.3, platelet count 537.  Sodium 141, potassium 3.8, chloride 111, CO2 22, glucose 101, BUN 30, creatinine 1.19.  Lactic acid 1.2.  MRSA PCR negative.  Right hand x-ray with marked severity degenerative changes, soft tissue swelling involving the second and third right fingers without an acute osseous abnormality.  Blood cultures x2 obtained.  Orthopedic hand surgery was consulted.  Hospital service consulted for further evaluation management of right hand infection secondary to cat scratch. ? ?Hospital  course: ? ?Right hand cat bite with palmar abscess ?Patient presenting to ED with progressive erythema, swelling, pain to right hand following scat scratch several days prior; despite being on Augmentin last 2 days.  Patient is afebrile with mild leukocytosis of 11.5.  Right hand x-ray with soft tissue swelling second/third right fingers.  Orthopedics, [name] was consulted and patient underwent incision and drainage of right hand cat bite with palmar abscess on 02/02/2022.  Operative cultures with abundant WBCs, no organisms on Gram stain; further culture pending at time of discharge.  Orthopedics recommends continue Augmentin and follow-up scheduled on 02/08/2022.  We will also continue azithromycin given cat bite for 5 days.  Patient to maintain dressing in place until orthopedics follow-up. ?  ?Essential hypertension ?Continue home amlodipine 10 mg p.o. daily, Carvedilol 25 mg p.o. twice daily, Chlorthalidone 12.5 mg p.o. twice daily, and isosorbide mononitrate 60 mg p.o. daily ?  ?Breast cancer ?Follows with medical oncology outpatient, [name].  Continue outpatient follow-up as scheduled. ? ? ?Discharge Diagnoses:  ?Principal Problem: ?  Infection of hand ?Active Problems: ?  Hypertension ?  Carcinoma of upper-outer quadrant of left breast in female, estrogen receptor positive ([place]) ? ? ? ?Discharge Instructions ? ?Discharge Instructions   ? ? Call MD for:  difficulty breathing, headache or visual disturbances   Complete by: As directed ?  ? Call MD for:  extreme fatigue   Complete by: As directed ?  ? Call MD for:  persistant dizziness or light-headedness   Complete by: As directed ?  ? Call MD for:  persistant nausea and vomiting   Complete by: As directed ?  ? Call MD for:  severe uncontrolled pain   Complete by: As directed ?  ?  Call MD for:  temperature >100.4   Complete by: As directed ?  ? Diet - low sodium heart healthy   Complete by: As directed ?  ? Discharge wound care:   Complete by: As  directed ?  ? Keep dressing in place and dry until follows up with orthopedics, [name] on 02/08/2022  ? Increase activity slowly   Complete by: As directed ?  ? ?  ? ?Allergies as of 02/03/2022   ? ?   Reactions  ? Ramipril Other (See Comments)  ? ?  ? ?  ?Medication List  ?  ? ?TAKE these medications   ? ?amLODipine 10 MG tablet ?Commonly known as: NORVASC ?Take 1 tablet (10 mg total) by mouth daily. ?What changed: when to take this ?  ?amoxicillin-clavulanate 875-125 MG tablet ?Commonly known as: AUGMENTIN ?Take 1 tablet by mouth 2 (two) times daily for 6 days. Bid x 14 days ?What changed:  ?when to take this ?additional instructions ?  ?anastrozole 1 MG tablet ?Commonly known as: ARIMIDEX ?Take 1 tablet (1 mg total) by mouth daily. ?What changed: when to take this ?  ?aspirin EC 81 MG tablet ?Take 81 mg by mouth daily. ?  ?azithromycin 500 MG tablet ?Commonly known as: ZITHROMAX ?Take 1 tablet (500 mg total) by mouth daily for 3 days. ?Start taking on: Feb 04, 2022 ?  ?Belsomra 10 MG Tabs ?Generic drug: Suvorexant ?Take 10 mg by mouth at bedtime as needed (sleep). ?  ?Biotin 10000 MCG Tabs ?Take 10,000 mcg by mouth daily with supper. ?  ?CALTRATE 600+D PO ?Take 1 tablet by mouth 2 (two) times daily. ?  ?carvedilol 25 MG tablet ?Commonly known as: COREG ?Take 1 tablet (25 mg total) by mouth 2 (two) times daily with a meal. Please keep 7/23 appointment for additional refills thanks. ?  ?chlorthalidone 25 MG tablet ?Commonly known as: HYGROTON ?Take 0.5 tablets (12.5 mg total) by mouth daily. ?What changed: when to take this ?  ?FeroSul 325 (65 FE) MG tablet ?Generic drug: ferrous sulfate ?Take 325 mg by mouth daily. ?  ?hydrALAZINE 10 MG tablet ?Commonly known as: APRESOLINE ?Take 1 tablet (10 mg total) by mouth in the morning and at bedtime. ?  ?hydroxychloroquine 200 MG tablet ?Commonly known as: PLAQUENIL ?Take 200 mg by mouth daily. ?  ?isosorbide mononitrate 60 MG 24 hr tablet ?Commonly known as: IMDUR ?TAKE  1 TABLET BY MOUTH DAILY. ?  ?Proctozone-HC 2.5 % rectal cream ?Generic drug: hydrocortisone ?PLACE 1 APPLICATION RECTALLY 2 TIMES A DAY AS NEEDED FOR HEMORRHOIDS. ?What changed: See the new instructions. ?  ?traMADol 50 MG tablet ?Commonly known as: ULTRAM ?Take 50 mg by mouth daily as needed for moderate pain. ?  ?triamcinolone cream 0.1 % ?Commonly known as: KENALOG ?Apply 1 application. topically as needed (rash). ?  ?Vitamin D3 50 MCG (2000 UT) Tabs ?Take 2,000 Units by mouth in the morning and at bedtime. ?  ? ?  ? ?  ?  ? ? ?  ?Discharge Care Instructions  ?(From admission, onward)  ?  ? ? ?  ? ?  Start     Ordered  ? 02/03/22 0000  Discharge wound care:       ?Comments: Keep dressing in place and dry until follows up with orthopedics, [name] on 02/08/2022  ? 02/03/22 0950  ? ?  ?  ? ?  ? ? Follow-up Information   ? ? Alison Cha, MD. Schedule an appointment as soon  as possible for a visit in 1 week(s).   ?Specialty: Internal Medicine ?Contact information: ?[address] ?[address] ?[place] [address] ?[phone] ? ? ?  ?  ? ? Belva Crome, MD .   ?Specialty: Cardiology ?Contact information: ?[address] ?[address] ?[place] [address] ?[phone] ? ? ?  ?  ? ? Iran Planas, MD. Daphane Shepherd on 02/08/2022.   ?Specialty: Orthopedic Surgery ?Contact information: ?[address] ?[address] ?[place] [address] ?[phone] ? ? ?  ?  ? ?  ?  ? ?  ? ?Allergies  ?Allergen Reactions  ? Ramipril Other (See Comments)  ? ? ?Consultations: ?Orthopedic hand surgery, [name] ? ? ?Procedures/Studies: ?DG Hand Complete Right ? ?Result Date: 02/01/2022 ?CLINICAL DATA:  Status post recent cat scratch with subsequent redness and swelling. EXAM: RIGHT HAND - COMPLETE 3+ VIEW COMPARISON:  None. FINDINGS: There is no evidence of an acute fracture or dislocation. Marked severity degenerative changes are seen involving the carpometacarpal and interphalangeal joints of the right thumb, and the D IP  joints of the second and third right fingers. Mild soft tissue swelling is seen involving the second and third right fingers. IMPRESSION: 1. Marked severity degenerative changes. 2. Soft tissue swelling involvi

## 2022-02-03 NOTE — Progress Notes (Signed)
Pharmacy Antibiotic Note ? ?[name] is a 88 y.o. female admitted on 02/01/2022 with  hand infection after cat scratch .  Pharmacy has been consulted for Zosyn dosing.  Had been on Augmentin x2d with worsening pain and swelling. ? ?Patient with Scr increase from 1.19 to 1.79 this AM. Some concern for risk of zosyn induced renal toxicity. D/w MD, will change to Unasyn as we do not need pseudomonas coverage.  ? ?Plan: ?D/c zosyn ?Unasyn 3gm IV q12h ? ? ?Height: 5' 5.98" (167.6 cm) ?Weight: 55.6 kg (122 lb 9.2 oz) ?IBW/kg (Calculated) : 59.26 ? ?Temp (24hrs), Avg:97.7 ?F (36.5 ?C), Min:97.2 ?F (36.2 ?C), Max:98 ?F (36.7 ?C) ? ?Recent Labs  ?Lab 02/01/22 ?0215 02/01/22 ?1641 02/03/22 ?3536  ?WBC  --  11.5* 11.9*  ?CREATININE  --  1.19* 1.79*  ?LATICACIDVEN 1.0 1.2  --   ? ?  ?Estimated Creatinine Clearance: 20.2 mL/min (A) (by C-G formula based on SCr of 1.79 mg/dL (H)).   ? ?Allergies  ?Allergen Reactions  ? Ramipril Other (See Comments)  ? ? ?Sirus Labrie A. Levada Dy, PharmD, BCPS, FNKF ?Clinical Pharmacist ?[place] ?Please utilize Amion for appropriate phone number to reach the unit pharmacist ([place]) ?  ?02/03/2022 7:57 AM ? ?

## 2022-02-03 NOTE — Plan of Care (Signed)

## 2022-02-07 LAB — AEROBIC/ANAEROBIC CULTURE W GRAM STAIN (SURGICAL/DEEP WOUND)

## 2022-02-07 LAB — CULTURE, BLOOD (ROUTINE X 2)
Culture: NO GROWTH
Culture: NO GROWTH

## 2022-02-13 DIAGNOSIS — L03113 Cellulitis of right upper limb: Secondary | ICD-10-CM | POA: Diagnosis not present

## 2022-02-13 DIAGNOSIS — I25119 Atherosclerotic heart disease of native coronary artery with unspecified angina pectoris: Secondary | ICD-10-CM | POA: Diagnosis not present

## 2022-02-13 DIAGNOSIS — I10 Essential (primary) hypertension: Secondary | ICD-10-CM | POA: Diagnosis not present

## 2022-02-13 DIAGNOSIS — N1832 Chronic kidney disease, stage 3b: Secondary | ICD-10-CM | POA: Diagnosis not present

## 2022-02-15 DIAGNOSIS — E538 Deficiency of other specified B group vitamins: Secondary | ICD-10-CM | POA: Diagnosis not present

## 2022-02-21 ENCOUNTER — Other Ambulatory Visit: Payer: Self-pay | Admitting: Interventional Cardiology

## 2022-02-26 DIAGNOSIS — L918 Other hypertrophic disorders of the skin: Secondary | ICD-10-CM | POA: Diagnosis not present

## 2022-02-26 DIAGNOSIS — R208 Other disturbances of skin sensation: Secondary | ICD-10-CM | POA: Diagnosis not present

## 2022-02-26 DIAGNOSIS — Z789 Other specified health status: Secondary | ICD-10-CM | POA: Diagnosis not present

## 2022-02-26 DIAGNOSIS — L298 Other pruritus: Secondary | ICD-10-CM | POA: Diagnosis not present

## 2022-02-26 DIAGNOSIS — L538 Other specified erythematous conditions: Secondary | ICD-10-CM | POA: Diagnosis not present

## 2022-02-26 DIAGNOSIS — L821 Other seborrheic keratosis: Secondary | ICD-10-CM | POA: Diagnosis not present

## 2022-02-26 NOTE — Progress Notes (Signed)
Cardiology Office Note:    Date:  02/27/2022   ID:  [name], DOB 28-Oct-1933, MRN [id number]  PCP:  Leeroy Cha, MD  Cardiologist:  Sinclair Grooms, MD   Referring MD: Leeroy Cha,*   Chief Complaint  Patient presents with   Congestive Heart Failure   Follow-up    Left carotid bruit Systolic murmur-?  Aortic stenosis    History of Present Illness:    [name] is a 88 y.o. female with a hx of moderate CAD, left carotid bruit, primary hypertension, hyperlipidemia, systolic heart murmur, dyspnea on exertion and premature atrial contractions.   Occasional episodes of tightness in the chest.  Denies orthopnea, PND, syncope, and lower extremity swelling.  Has occasional palpitations when there is chest tightness.  She has had the discomfort off and on over the years.  It is relatively infrequent.  Not particularly exertion related.  She denies peripheral edema and other signs and symptoms of volume overload.  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   Breast cancer ([place]) 2010   RIGHT   CAD (coronary artery disease)    Dysuria    Family history of breast cancer    GERD (gastroesophageal reflux disease)    HX OF   Heart murmur    History of blood transfusion    History of breast cancer ONCOLOGIST-- DR Benay Spice   DX 2010--  RIGHT BREAST DCIS  HIGH GRADE S/P LUMPECTOMY W/ SLN BX--  NO RECURRENCE   History of kidney stones    History of ovarian cancer    2009--  S/P COLON RESECTION FOR PAPILLARY SEROUS CARCINOMA, BORDERLINE OVARIAN CANCER VERSUS PRIMARY PERITONEAL CARCINOMA WITH PSAMMOMA BODIES--  NO RECURRENCE   History of skin cancer    excision basal cell   Hyperlipidemia    Hypertension    Kidney stones 7/14   Nocturia    Ovarian carcinoma (HCC)    papillary serous-low grade, recurrence found in fat necrosis during ileostomy   Renal stones 2014   Shingles 30  YRS AGO   Ureteral calculi    bilateral    Past Surgical History:  Procedure Laterality Date   APPENDECTOMY     BREAST LUMPECTOMY Right 04-13-2009   W/ SLN BX   BREAST LUMPECTOMY WITH RADIOACTIVE SEED LOCALIZATION Left 06/11/2019   Procedure: LEFT BREAST LUMPECTOMY WITH RADIOACTIVE SEED LOCALIZATION;  Surgeon: Jovita Kussmaul, MD;  Location: [facility];  Service: General;  Laterality: Left;   CARDIAC CATHETERIZATION  11-11-2005 DR Daneen Schick   MODERATE LAD DISEASE/ NORMAL LVF/ EF 65-75%   CATARACT EXTRACTION W/ INTRAOCULAR LENS IMPLANT Bilateral 04/26/2013   CHOLECYSTECTOMY  1990   OPEN   COLONOSCOPY     CYSTOSCOPY W/ URETERAL STENT PLACEMENT Bilateral 05/07/2013   Procedure: CYSTOSCOPY WITH STENT REPLACEMENTS;  Surgeon: Alexis Frock, MD;  Location: [facility];  Service: Urology;  Laterality: Bilateral;   CYSTOSCOPY WITH BIOPSY N/A 04/07/2013   Procedure: CYSTOSCOPY WITH BIOPSY and fulgeration;  Surgeon: Alexis Frock, MD;  Location: [facility];  Service: Urology;  Laterality: N/A;   CYSTOSCOPY WITH RETROGRADE PYELOGRAM, URETEROSCOPY AND STENT PLACEMENT Bilateral 05/07/2013   Procedure: CYSTOSCOPY WITH RETROGRADE PYELOGRAM, URETEROSCOPY ;  Surgeon: Alexis Frock, MD;  Location: [facility] [place];  Service: Urology;  Laterality: Bilateral;   CYSTOSCOPY WITH RETROGRADE PYELOGRAM, URETEROSCOPY AND STENT PLACEMENT Bilateral 01/06/2019   Procedure: CYSTOSCOPY WITH RETROGRADE PYELOGRAM,  URETEROSCOPY AND STENT PLACEMENT, FIRST STAGE;  Surgeon: Alexis Frock, MD;  Location: [facility];  Service: Urology;  Laterality: Bilateral;   CYSTOSCOPY WITH RETROGRADE PYELOGRAM, URETEROSCOPY AND STENT PLACEMENT Bilateral 01/27/2019   Procedure: CYSTOSCOPY WITH RETROGRADE PYELOGRAM, URETEROSCOPY AND STENT PLACEMENT;  Surgeon: Alexis Frock, MD;  Location: [facility];  Service: Urology;  Laterality: Bilateral;  75 MINS   CYSTOSCOPY WITH STENT PLACEMENT Bilateral 04/07/2013   Procedure: CYSTOSCOPY WITH  STENT PLACEMENT;  Surgeon: Alexis Frock, MD;  Location: [facility];  Service: Urology;  Laterality: Bilateral;   DX LAPAROSCOPY W/ PERITONEAL AND OMENTAL BX'S AND WASHINGS  02-16-2008   EXCISION RIGHT BREAST MASS  02-10-2002   EXP. LAP. EXTENSIVE ADHESIOLYSIS/ RESECTION TERMINAL ILEUM , ASCENDING AND DESCENDING COLON WITH CREATION ILEOSTOMY AND MUCOUS FISTULA  02-19-2008   PERFERATION AND ABD. CANCER--  TAKEDOWN ILEOSTOMY 08-23-2008   HOLMIUM LASER APPLICATION Bilateral 7/0/2637   Procedure: HOLMIUM LASER APPLICATION;  Surgeon: Alexis Frock, MD;  Location: [facility];  Service: Urology;  Laterality: Bilateral;   HOLMIUM LASER APPLICATION Bilateral 8/58/8502   Procedure: HOLMIUM LASER APPLICATION;  Surgeon: Alexis Frock, MD;  Location: [facility];  Service: Urology;  Laterality: Bilateral;   I & D EXTREMITY Right 02/02/2022   Procedure: IRRIGATION AND DEBRIDEMENT HAND;  Surgeon: Iran Planas, MD;  Location: [facility];  Service: Orthopedics;  Laterality: Right;   ILEOSTOMY CLOSURE  08/2008   TOTAL ABDOMINAL HYSTERECTOMY W/ BILATERAL SALPINGOOPHORECTOMY  1976   TOTAL ABDOMINAL HYSTERECTOMY W/ BILATERAL SALPINGOOPHORECTOMY  1976   VULVAR LESION REMOVAL N/A 08/22/2014   Procedure: EXCISION OF VULVAR CYST  ;  Surgeon: Lyman Speller, MD;  Location: [facility];  Service: Gynecology;  Laterality: N/A;   WOUND DEBRIDEMENT  05/28/2012   Procedure: DEBRIDEMENT ABDOMINAL WOUND;  Surgeon: Haywood Lasso, MD;  Location: [place];  Service: General;  Laterality: N/A;  excision chronic wound abdominal wall    Current Medications: Current Meds  Medication Sig   amLODipine (NORVASC) 10 MG tablet Take 1 tablet (10 mg total) by mouth daily. (Patient taking differently: Take 10 mg by mouth daily with lunch.)   anastrozole (ARIMIDEX) 1 MG tablet Take 1 tablet (1 mg total) by mouth daily. (Patient taking differently: Take 1 mg by mouth daily with supper.)   aspirin EC 81 MG tablet Take 81 mg by mouth  daily.   BELSOMRA 10 MG TABS Take 10 mg by mouth at bedtime as needed (sleep).    Biotin 10000 MCG TABS Take 10,000 mcg by mouth daily with supper.   Calcium Carbonate-Vitamin D (CALTRATE 600+D PO) Take 1 tablet by mouth 2 (two) times daily.   chlorthalidone (HYGROTON) 25 MG tablet Take 0.5 tablets (12.5 mg total) by mouth daily. (Patient taking differently: Take 12.5 mg by mouth in the morning and at bedtime.)   Cholecalciferol (VITAMIN D3) 50 MCG (2000 UT) TABS Take 2,000 Units by mouth in the morning and at bedtime.   FEROSUL 325 (65 Fe) MG tablet Take 325 mg by mouth daily.   hydrALAZINE (APRESOLINE) 10 MG tablet Take 1 tablet (10 mg total) by mouth in the morning and at bedtime.   hydroxychloroquine (PLAQUENIL) 200 MG tablet Take 200 mg by mouth daily.   isosorbide mononitrate (IMDUR) 60 MG 24 hr tablet TAKE 1 TABLET BY MOUTH DAILY.   PROCTOZONE-HC 2.5 % rectal cream PLACE 1 APPLICATION RECTALLY 2 TIMES A DAY AS NEEDED FOR HEMORRHOIDS. (Patient taking differently: Place 1 application. rectally 2 (two) times daily as needed for  hemorrhoids.)   traMADol (ULTRAM) 50 MG tablet Take 50 mg by mouth daily as needed for moderate pain.   triamcinolone (KENALOG) 0.1 % Apply 1 application. topically as needed (rash).   [DISCONTINUED] carvedilol (COREG) 25 MG tablet Take 1 tablet (25 mg total) by mouth 2 (two) times daily with a meal. Please keep 7/23 appointment for additional refills thanks.     Allergies:   Ramipril   Social History   Socioeconomic History   Marital status: Widowed    Spouse name: Not on file   Number of children: Not on file   Years of education: Not on file   Highest education level: Not on file  Occupational History   Not on file  Tobacco Use   Smoking status: Never   Smokeless tobacco: Never  Vaping Use   Vaping Use: Never used  Substance and Sexual Activity   Alcohol use: No   Drug use: No   Sexual activity: Never    Partners: Male    Birth control/protection:  Surgical    Comment: Hysterectomy  Other Topics Concern   Not on file  Social History Narrative   Not on file   Social Determinants of Health   Financial Resource Strain: Not on file  Food Insecurity: Not on file  Transportation Needs: Not on file  Physical Activity: Not on file  Stress: Not on file  Social Connections: Not on file     Family History: The patient's family history includes Breast cancer in her sister; Cancer in her sister; Heart disease in her mother; Hyperlipidemia in her mother.  ROS:   Please see the history of present illness.    No medication side effects.  Occasional palpitations.  Physically active at home.  All other systems reviewed and are negative.  EKGs/Labs/Other Studies Reviewed:    The following studies were reviewed today:  Carotid Doppler 2019: Final Interpretation:  Right Carotid: Velocities in the right ICA are consistent with a 1-39%  stenosis.                 Non-hemodynamically significant plaque <50% noted in the  CCA.   Left Carotid: Velocities in the left ICA are consistent with a 1-39%  stenosis.                Hemodynamically significant plaque >50% visualized in the  CCA.   Vertebrals:  Bilateral vertebral arteries demonstrate antegrade flow.  Subclavians: Normal flow hemodynamics were seen in bilateral subclavian               arteries.   *See table(s) above for measurements and observations.       Electronically signed by Jenkins Rouge on 02/18/2018 at 1:49:18 PM.  EKG:  EKG normal sinus rhythm, incomplete right bundle with QS pattern V1 and V2.  Biatrial abnormality, leftward axis, and when compared to December 11, 2020, no significant changes noted.  Recent Labs: 02/01/2022: ALT 14 02/03/2022: BUN 35; Creatinine, Ser 1.79; Hemoglobin 12.0; Platelets 420; Potassium 3.2; Sodium 138  Recent Lipid Panel    Component Value Date/Time   CHOL  02/29/2008 0515    61        ATP III CLASSIFICATION:  <200     mg/dL   Desirable   200-239  mg/dL   Borderline High  >=240    mg/dL   High   TRIG 110 02/29/2008 0515    Physical Exam:    VS:  BP 132/60   Pulse 67  Ht '5\' 6"'$  (1.676 m)   Wt 127 lb 3.2 oz (57.7 kg)   LMP 10/07/1974   SpO2 98%   BMI 20.53 kg/m     Wt Readings from Last 3 Encounters:  02/27/22 127 lb 3.2 oz (57.7 kg)  02/02/22 122 lb 9.2 oz (55.6 kg)  10/26/21 122 lb 9.6 oz (55.6 kg)     GEN: Slender but has gained some weight since January.. No acute distress HEENT: Normal NECK: Noticeable left mid neck carotid bruit.  No JVD. LYMPHATICS: No lymphadenopathy CARDIAC: Right upper sternal systolic murmur can be heard in the base of the right neck.  Murmur. RRR no gallop, or edema. VASCULAR:  Normal Pulses. No bruits. RESPIRATORY:  Clear to auscultation without rales, wheezing or rhonchi  ABDOMEN: Soft, non-tender, non-distended, No pulsatile mass, MUSCULOSKELETAL: No deformity  SKIN: Warm and dry NEUROLOGIC:  Alert and oriented x 3 PSYCHIATRIC:  Normal affect   ASSESSMENT:    1. Chest pain of uncertain etiology   2. Primary hypertension   3. Coronary artery disease involving native coronary artery of native heart without angina pectoris   4. Bilateral carotid artery stenosis   5. Mixed hyperlipidemia   6. Systolic murmur    PLAN:    In order of problems listed above:  Suspect microvascular disease.  Cannot exclude obstructive coronary disease with angina.  Symptoms are stable.  Advised her to use sublingual nitroglycerin if discomfort recurs and lasts longer than 5 minutes. Systolic blood pressure is in the 1 40-1 50 range.  Low-salt diet and physical activity recommended. Continue risk prevention with statin therapy, blood pressure control, and proper diet. Carotid bruit left neck.  2019 there was greater than 50%, carotid obstruction/plaque.  Follow-up surveillance is needed. Last LDL performed in June 2022 was less than 70. Systolic murmur needs to be assessed to rule out aortic  valve stenosis.  No recent echocardiogram available for comparison.    Overall education and awareness concerning primary/secondary risk prevention was discussed in detail: LDL less than 70, hemoglobin A1c less than 7, blood pressure target less than 130/80 mmHg, >150 minutes of moderate aerobic activity per week, avoidance of smoking, weight control (via diet and exercise), and continued surveillance/management of/for obstructive sleep apnea.    Medication Adjustments/Labs and Tests Ordered: Current medicines are reviewed at length with the patient today.  Concerns regarding medicines are outlined above.  Orders Placed This Encounter  Procedures   EKG 12-Lead   ECHOCARDIOGRAM COMPLETE   VAS US CAROTID   Meds ordered this encounter  Medications   carvedilol (COREG) 25 MG tablet    Sig: Take 1 tablet (25 mg total) by mouth 2 (two) times daily with a meal.    Dispense:  180 tablet    Refill:  3    Patient Instructions  Medication Instructions:  Your physician recommends that you continue on your current medications as directed. Please refer to the Current Medication list given to you today.  *If you need a refill on your cardiac medications before your next appointment, please call your pharmacy*  Lab Work: NONE  Testing/Procedures: Your physician has requested that you have an echocardiogram. Echocardiography is a painless test that uses sound waves to create images of your heart. It provides your doctor with information about the size and shape of your heart and how well your heart's chambers and valves are working. This procedure takes approximately one hour. There are no restrictions for this procedure.  Your physician has requested that  you have a carotid duplex. This test is an ultrasound of the carotid arteries in your neck. It looks at blood flow through these arteries that supply the brain with blood. Allow one hour for this exam. There are no restrictions or special  instructions.   Follow-Up: At [facility], you and your health needs are our priority.  As part of our continuing mission to provide you with exceptional heart care, we have created designated Provider Care Teams.  These Care Teams include your primary Cardiologist (physician) and Advanced Practice Providers (APPs -  Physician Assistants and Nurse Practitioners) who all work together to provide you with the care you need, when you need it.  Your next appointment:   1 year(s)  The format for your next appointment:   In Person  Provider:   Sinclair Grooms, MD {   Important Information About Sugar         Signed, Sinclair Grooms, MD  02/27/2022 2:59 PM    [place]

## 2022-02-27 ENCOUNTER — Encounter: Payer: Self-pay | Admitting: Interventional Cardiology

## 2022-02-27 ENCOUNTER — Ambulatory Visit (INDEPENDENT_AMBULATORY_CARE_PROVIDER_SITE_OTHER): Payer: Medicare Other | Admitting: Interventional Cardiology

## 2022-02-27 VITALS — BP 132/60 | HR 67 | Ht 66.0 in | Wt 127.2 lb

## 2022-02-27 DIAGNOSIS — I6523 Occlusion and stenosis of bilateral carotid arteries: Secondary | ICD-10-CM

## 2022-02-27 DIAGNOSIS — E782 Mixed hyperlipidemia: Secondary | ICD-10-CM

## 2022-02-27 DIAGNOSIS — R079 Chest pain, unspecified: Secondary | ICD-10-CM

## 2022-02-27 DIAGNOSIS — I2510 Atherosclerotic heart disease of native coronary artery without angina pectoris: Secondary | ICD-10-CM

## 2022-02-27 DIAGNOSIS — R011 Cardiac murmur, unspecified: Secondary | ICD-10-CM

## 2022-02-27 DIAGNOSIS — I10 Essential (primary) hypertension: Secondary | ICD-10-CM | POA: Diagnosis not present

## 2022-02-27 MED ORDER — CARVEDILOL 25 MG PO TABS: 25.0000 mg | ORAL_TABLET | Freq: Two times a day (BID) | ORAL | 3 refills | Status: DC

## 2022-02-27 NOTE — Patient Instructions (Signed)
Medication Instructions:  Your physician recommends that you continue on your current medications as directed. Please refer to the Current Medication list given to you today.  *If you need a refill on your cardiac medications before your next appointment, please call your pharmacy*  Lab Work: NONE  Testing/Procedures: Your physician has requested that you have an echocardiogram. Echocardiography is a painless test that uses sound waves to create images of your heart. It provides your doctor with information about the size and shape of your heart and how well your heart's chambers and valves are working. This procedure takes approximately one hour. There are no restrictions for this procedure.  Your physician has requested that you have a carotid duplex. This test is an ultrasound of the carotid arteries in your neck. It looks at blood flow through these arteries that supply the brain with blood. Allow one hour for this exam. There are no restrictions or special instructions.   Follow-Up: At [facility], you and your health needs are our priority.  As part of our continuing mission to provide you with exceptional heart care, we have created designated Provider Care Teams.  These Care Teams include your primary Cardiologist (physician) and Advanced Practice Providers (APPs -  Physician Assistants and Nurse Practitioners) who all work together to provide you with the care you need, when you need it.  Your next appointment:   1 year(s)  The format for your next appointment:   In Person  Provider:   Sinclair Grooms, MD {   Important Information About Sugar

## 2022-03-13 ENCOUNTER — Encounter (HOSPITAL_COMMUNITY): Payer: Medicare Other

## 2022-03-14 ENCOUNTER — Ambulatory Visit (HOSPITAL_COMMUNITY)
Admission: RE | Admit: 2022-03-14 | Discharge: 2022-03-14 | Disposition: A | Discharge status: Home / Self Care | Payer: Medicare Other | Source: Ambulatory Visit | Attending: Interventional Cardiology | Admitting: Interventional Cardiology

## 2022-03-14 DIAGNOSIS — I6523 Occlusion and stenosis of bilateral carotid arteries: Secondary | ICD-10-CM

## 2022-03-19 DIAGNOSIS — D510 Vitamin B12 deficiency anemia due to intrinsic factor deficiency: Secondary | ICD-10-CM | POA: Diagnosis not present

## 2022-03-21 ENCOUNTER — Other Ambulatory Visit (HOSPITAL_COMMUNITY): Payer: Medicare Other

## 2022-03-21 DIAGNOSIS — M25511 Pain in right shoulder: Secondary | ICD-10-CM | POA: Diagnosis not present

## 2022-03-21 DIAGNOSIS — M7541 Impingement syndrome of right shoulder: Secondary | ICD-10-CM | POA: Diagnosis not present

## 2022-03-22 ENCOUNTER — Ambulatory Visit (HOSPITAL_COMMUNITY): Payer: Medicare Other | Attending: Cardiology

## 2022-03-22 DIAGNOSIS — R011 Cardiac murmur, unspecified: Secondary | ICD-10-CM | POA: Insufficient documentation

## 2022-03-22 LAB — ECHOCARDIOGRAM COMPLETE
Area-P 1/2: 4.06 cm2
S' Lateral: 2.1 cm

## 2022-04-02 DIAGNOSIS — M25511 Pain in right shoulder: Secondary | ICD-10-CM | POA: Diagnosis not present

## 2022-04-03 DIAGNOSIS — M75101 Unspecified rotator cuff tear or rupture of right shoulder, not specified as traumatic: Secondary | ICD-10-CM | POA: Diagnosis not present

## 2022-04-03 DIAGNOSIS — N1832 Chronic kidney disease, stage 3b: Secondary | ICD-10-CM | POA: Diagnosis not present

## 2022-04-03 DIAGNOSIS — D510 Vitamin B12 deficiency anemia due to intrinsic factor deficiency: Secondary | ICD-10-CM | POA: Diagnosis not present

## 2022-04-03 DIAGNOSIS — Z170 Estrogen receptor positive status [ER+]: Secondary | ICD-10-CM | POA: Diagnosis not present

## 2022-04-03 DIAGNOSIS — Z0000 Encounter for general adult medical examination without abnormal findings: Secondary | ICD-10-CM | POA: Diagnosis not present

## 2022-04-03 DIAGNOSIS — F5101 Primary insomnia: Secondary | ICD-10-CM | POA: Diagnosis not present

## 2022-04-03 DIAGNOSIS — I25119 Atherosclerotic heart disease of native coronary artery with unspecified angina pectoris: Secondary | ICD-10-CM | POA: Diagnosis not present

## 2022-04-03 DIAGNOSIS — Z7189 Other specified counseling: Secondary | ICD-10-CM | POA: Diagnosis not present

## 2022-04-03 DIAGNOSIS — Z1331 Encounter for screening for depression: Secondary | ICD-10-CM | POA: Diagnosis not present

## 2022-04-03 DIAGNOSIS — C50312 Malignant neoplasm of lower-inner quadrant of left female breast: Secondary | ICD-10-CM | POA: Diagnosis not present

## 2022-04-03 DIAGNOSIS — M170 Bilateral primary osteoarthritis of knee: Secondary | ICD-10-CM | POA: Diagnosis not present

## 2022-04-03 DIAGNOSIS — I10 Essential (primary) hypertension: Secondary | ICD-10-CM | POA: Diagnosis not present

## 2022-04-12 ENCOUNTER — Telehealth: Payer: Self-pay | Admitting: Interventional Cardiology

## 2022-04-12 NOTE — Telephone Encounter (Signed)
Pt c/o medication issue:  1. Name of Medication: Rosuvastatin '10mg'$   2. How are you currently taking this medication (dosage and times per day)? Every other day.   3. Are you having a reaction (difficulty breathing--STAT)?   4. What is your medication issue? Patient states her PCP wants to her start taking this medication every other day. She doesn't want to take it until she finds out what Dr. Tamala Julian thinks about it.

## 2022-04-12 NOTE — Telephone Encounter (Signed)
Returned call to patient who states that her PCP would like her to start rosuvastatin '10mg'$  every other day. Patient states that she is hesitant to start a statin as she has never taken one and reports that she already has some muscle aches in legs that she struggles with and is hesitant about taking a statin. Patient states that she would like to see what Dr. Tamala Julian thinks about the statin and if he feels it is indicated she will start it. Advised patient I would forward to Dr. Tamala Julian for him to review and advise. Patient verbalized understanding.   Added in patients recent lipid panel from her PCP on 04/03/22.   Lipid Panel w/reflex   2022-04-03    LDL Chol Calc (NIH) 34   0-99  CHOL/HDL 2.6   2.0-4.0  Cholesterol 107   <200  HDLD 41   30-85  LDL Chol Calc (NIH) 34   0-99  NHDL 66   0-129  Triglyceride 196   0-199

## 2022-04-13 DIAGNOSIS — M25511 Pain in right shoulder: Secondary | ICD-10-CM | POA: Diagnosis not present

## 2022-04-16 NOTE — Telephone Encounter (Signed)
LDL cholesterol is already less than 40. Not sure statin will add anything. I know there is plaque in carotids.  I don't feel strongly about it. If we were going to treat anything, very low dose statin would be safe as recommended by PCP.

## 2022-04-17 NOTE — Telephone Encounter (Signed)
Spoke with patient and discussed Dr. Thompson Caul comment regarding Rosuvastatin.  Per Dr. Tamala Julian: LDL cholesterol is already less than 40. Not sure statin will add anything. I know there is plaque in carotids.   I don't feel strongly about it. If we were going to treat anything, very low dose statin would be safe as recommended by PCP.  Patient states she will try taking the rosuvastatin as prescribed by PCP and if she has any side effects (such as muscle/joint pain) she will discontinue.  Patient expressed appreciation for call.

## 2022-04-19 DIAGNOSIS — E538 Deficiency of other specified B group vitamins: Secondary | ICD-10-CM | POA: Diagnosis not present

## 2022-04-25 DIAGNOSIS — S43431D Superior glenoid labrum lesion of right shoulder, subsequent encounter: Secondary | ICD-10-CM | POA: Diagnosis not present

## 2022-04-25 DIAGNOSIS — M25511 Pain in right shoulder: Secondary | ICD-10-CM | POA: Diagnosis not present

## 2022-05-06 DIAGNOSIS — I10 Essential (primary) hypertension: Secondary | ICD-10-CM | POA: Diagnosis not present

## 2022-05-06 DIAGNOSIS — N1832 Chronic kidney disease, stage 3b: Secondary | ICD-10-CM | POA: Diagnosis not present

## 2022-05-15 DIAGNOSIS — N1832 Chronic kidney disease, stage 3b: Secondary | ICD-10-CM | POA: Diagnosis not present

## 2022-05-15 DIAGNOSIS — M1991 Primary osteoarthritis, unspecified site: Secondary | ICD-10-CM | POA: Diagnosis not present

## 2022-05-15 DIAGNOSIS — Z79899 Other long term (current) drug therapy: Secondary | ICD-10-CM | POA: Diagnosis not present

## 2022-05-15 DIAGNOSIS — M25511 Pain in right shoulder: Secondary | ICD-10-CM | POA: Diagnosis not present

## 2022-05-15 DIAGNOSIS — Z6820 Body mass index (BMI) 20.0-20.9, adult: Secondary | ICD-10-CM | POA: Diagnosis not present

## 2022-05-15 DIAGNOSIS — R768 Other specified abnormal immunological findings in serum: Secondary | ICD-10-CM | POA: Diagnosis not present

## 2022-05-20 ENCOUNTER — Other Ambulatory Visit: Payer: Self-pay | Admitting: Interventional Cardiology

## 2022-05-24 DIAGNOSIS — E538 Deficiency of other specified B group vitamins: Secondary | ICD-10-CM | POA: Diagnosis not present

## 2022-05-27 DIAGNOSIS — Z1231 Encounter for screening mammogram for malignant neoplasm of breast: Secondary | ICD-10-CM | POA: Diagnosis not present

## 2022-06-25 ENCOUNTER — Inpatient Hospital Stay: Payer: Medicare Other | Attending: Oncology | Admitting: Oncology

## 2022-06-25 ENCOUNTER — Encounter: Payer: Self-pay | Admitting: Oncology

## 2022-06-25 ENCOUNTER — Inpatient Hospital Stay: Payer: Medicare Other

## 2022-06-25 VITALS — BP 128/58 | HR 60 | Temp 98.1°F | Resp 18 | Ht 66.0 in | Wt 126.0 lb

## 2022-06-25 DIAGNOSIS — Z170 Estrogen receptor positive status [ER+]: Secondary | ICD-10-CM | POA: Diagnosis not present

## 2022-06-25 DIAGNOSIS — E538 Deficiency of other specified B group vitamins: Secondary | ICD-10-CM | POA: Diagnosis not present

## 2022-06-25 DIAGNOSIS — Z79811 Long term (current) use of aromatase inhibitors: Secondary | ICD-10-CM | POA: Insufficient documentation

## 2022-06-25 DIAGNOSIS — C50912 Malignant neoplasm of unspecified site of left female breast: Secondary | ICD-10-CM | POA: Insufficient documentation

## 2022-06-25 DIAGNOSIS — Z8543 Personal history of malignant neoplasm of ovary: Secondary | ICD-10-CM | POA: Diagnosis not present

## 2022-06-25 DIAGNOSIS — D75839 Thrombocytosis, unspecified: Secondary | ICD-10-CM | POA: Insufficient documentation

## 2022-06-25 LAB — CBC WITH DIFFERENTIAL (CANCER CENTER ONLY)
Abs Immature Granulocytes: 0.08 10*3/uL — ABNORMAL HIGH (ref 0.00–0.07)
Basophils Absolute: 0.2 10*3/uL — ABNORMAL HIGH (ref 0.0–0.1)
Basophils Relative: 2 %
Eosinophils Absolute: 0.4 10*3/uL (ref 0.0–0.5)
Eosinophils Relative: 3 %
HCT: 37.4 % (ref 36.0–46.0)
Hemoglobin: 12.4 g/dL (ref 12.0–15.0)
Immature Granulocytes: 1 %
Lymphocytes Relative: 10 %
Lymphs Abs: 1.3 10*3/uL (ref 0.7–4.0)
MCH: 31.5 pg (ref 26.0–34.0)
MCHC: 33.2 g/dL (ref 30.0–36.0)
MCV: 94.9 fL (ref 80.0–100.0)
Monocytes Absolute: 0.6 10*3/uL (ref 0.1–1.0)
Monocytes Relative: 5 %
Neutro Abs: 10.2 10*3/uL — ABNORMAL HIGH (ref 1.7–7.7)
Neutrophils Relative %: 79 %
Platelet Count: 463 10*3/uL — ABNORMAL HIGH (ref 150–400)
RBC: 3.94 MIL/uL (ref 3.87–5.11)
RDW: 14.1 % (ref 11.5–15.5)
WBC Count: 12.8 10*3/uL — ABNORMAL HIGH (ref 4.0–10.5)
nRBC: 0 % (ref 0.0–0.2)

## 2022-06-25 LAB — SAVE SMEAR(SSMR), FOR PROVIDER SLIDE REVIEW

## 2022-06-25 NOTE — Progress Notes (Signed)
[name] OFFICE PROGRESS NOTE   Diagnosis: Breast cancer  INTERVAL HISTORY:   [name] returns as scheduled.  She continues anastrozole.  No hot flashes.  She reports recent arthritis symptoms in the right shoulder.  She reports a recent negative mammogram at [facility].  No change over either breast.  No history of venous or arterial thromboembolic disease.  Objective:  Vital signs in last 24 hours:  Blood pressure (!) 128/58, pulse 60, temperature 98.1 F (36.7 C), temperature source Oral, resp. rate 18, height 5' 6" (1.676 m), weight 126 lb (57.2 kg), last menstrual period 10/07/1974, SpO2 100 %.    Lymphatics: No cervical, supraclavicular, axillary, or inguinal nodes Resp: Lungs clear bilaterally Cardio: Regular rate and rhythm GI: No hepatosplenomegaly, no mass, no apparent ascites Vascular: No leg edema Breast: Bilateral lumpectomy.  No evidence for local tumor recurrence.  No mass in either breast.   Lab Results:  Lab Results  Component Value Date   WBC 12.8 (H) 06/25/2022   HGB 12.4 06/25/2022   HCT 37.4 06/25/2022   MCV 94.9 06/25/2022   PLT 463 (H) 06/25/2022   NEUTROABS 10.2 (H) 06/25/2022    CMP  Lab Results  Component Value Date   NA 138 02/03/2022   K 3.2 (L) 02/03/2022   CL 108 02/03/2022   CO2 21 (L) 02/03/2022   GLUCOSE 121 (H) 02/03/2022   BUN 35 (H) 02/03/2022   CREATININE 1.79 (H) 02/03/2022   CALCIUM 8.5 (L) 02/03/2022   PROT 6.6 02/01/2022   ALBUMIN 3.7 02/01/2022   AST 14 (L) 02/01/2022   ALT 14 02/01/2022   ALKPHOS 97 02/01/2022   BILITOT 0.7 02/01/2022   GFRNONAA 27 (L) 02/03/2022   GFRAA 44 (L) 06/07/2019   Blood smear: The platelets are increased in number, the majority the platelets are small, no platelet clumps.  The majority the white cells are mature neutrophils, few bands.  No blasts or other young forms are seen.  Few ovalocytes and teardrops.  Medications: I have reviewed the patient's current  medications.   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  Status post a needle localized lumpectomy and sentinel lymph node biopsy 04/13/2009 with the pathology confirming high-grade ductal carcinoma in situ with an associated 0.26 mm invasive carcinoma, ER positive, PR positive, and HER2 positive. Status post adjuvant right breast radiation completed 06/19/2009. Initiation of Arimidex 05/01/2009.  Completed 5 years in July 2015 Family history of breast cancer. History of Clostridium difficile colitis. Frequent bowel movements following the bowel resection in 2009. 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  She will continue anastrozole.  We will follow-up on the recent mammogram report.  She is also in clinical remission from  the remote history of borderline ovarian cancer.  She has persistent mild thrombocytosis and leukocytosis.  These changes are likely related to rheumatoid arthritis.  We decided to obtain  peripheral blood BCR: ABL and  myeloproliferative panel.  She will return for a lab visit in 1 month and an office visit in 8 months.    Alison Coder, MD  06/25/2022  2:46 PM

## 2022-07-18 DIAGNOSIS — H00025 Hordeolum internum left lower eyelid: Secondary | ICD-10-CM | POA: Diagnosis not present

## 2022-07-22 DIAGNOSIS — N1832 Chronic kidney disease, stage 3b: Secondary | ICD-10-CM | POA: Diagnosis not present

## 2022-07-22 DIAGNOSIS — N189 Chronic kidney disease, unspecified: Secondary | ICD-10-CM | POA: Diagnosis not present

## 2022-07-24 ENCOUNTER — Inpatient Hospital Stay: Payer: Medicare Other

## 2022-07-24 DIAGNOSIS — L820 Inflamed seborrheic keratosis: Secondary | ICD-10-CM | POA: Diagnosis not present

## 2022-07-24 DIAGNOSIS — L821 Other seborrheic keratosis: Secondary | ICD-10-CM | POA: Diagnosis not present

## 2022-07-24 DIAGNOSIS — D229 Melanocytic nevi, unspecified: Secondary | ICD-10-CM | POA: Diagnosis not present

## 2022-07-24 DIAGNOSIS — L578 Other skin changes due to chronic exposure to nonionizing radiation: Secondary | ICD-10-CM | POA: Diagnosis not present

## 2022-07-24 DIAGNOSIS — L814 Other melanin hyperpigmentation: Secondary | ICD-10-CM | POA: Diagnosis not present

## 2022-07-26 DIAGNOSIS — E538 Deficiency of other specified B group vitamins: Secondary | ICD-10-CM | POA: Diagnosis not present

## 2022-07-26 DIAGNOSIS — Z23 Encounter for immunization: Secondary | ICD-10-CM | POA: Diagnosis not present

## 2022-07-31 ENCOUNTER — Inpatient Hospital Stay: Payer: Medicare Other | Attending: Oncology

## 2022-07-31 DIAGNOSIS — D75839 Thrombocytosis, unspecified: Secondary | ICD-10-CM | POA: Diagnosis not present

## 2022-07-31 DIAGNOSIS — Z170 Estrogen receptor positive status [ER+]: Secondary | ICD-10-CM | POA: Diagnosis not present

## 2022-07-31 DIAGNOSIS — Z79811 Long term (current) use of aromatase inhibitors: Secondary | ICD-10-CM | POA: Diagnosis not present

## 2022-07-31 DIAGNOSIS — C50912 Malignant neoplasm of unspecified site of left female breast: Secondary | ICD-10-CM | POA: Diagnosis not present

## 2022-07-31 LAB — CBC WITH DIFFERENTIAL (CANCER CENTER ONLY)
Abs Immature Granulocytes: 0.06 10*3/uL (ref 0.00–0.07)
Basophils Absolute: 0.2 10*3/uL — ABNORMAL HIGH (ref 0.0–0.1)
Basophils Relative: 2 %
Eosinophils Absolute: 0.4 10*3/uL (ref 0.0–0.5)
Eosinophils Relative: 3 %
HCT: 39 % (ref 36.0–46.0)
Hemoglobin: 13 g/dL (ref 12.0–15.0)
Immature Granulocytes: 1 %
Lymphocytes Relative: 11 %
Lymphs Abs: 1.2 10*3/uL (ref 0.7–4.0)
MCH: 31.4 pg (ref 26.0–34.0)
MCHC: 33.3 g/dL (ref 30.0–36.0)
MCV: 94.2 fL (ref 80.0–100.0)
Monocytes Absolute: 0.7 10*3/uL (ref 0.1–1.0)
Monocytes Relative: 6 %
Neutro Abs: 8.6 10*3/uL — ABNORMAL HIGH (ref 1.7–7.7)
Neutrophils Relative %: 77 %
Platelet Count: 441 10*3/uL — ABNORMAL HIGH (ref 150–400)
RBC: 4.14 MIL/uL (ref 3.87–5.11)
RDW: 13.9 % (ref 11.5–15.5)
WBC Count: 11 10*3/uL — ABNORMAL HIGH (ref 4.0–10.5)
nRBC: 0 % (ref 0.0–0.2)

## 2022-08-07 LAB — JAK2 (INCLUDING V617F AND EXON 12), MPL,& CALR W/RFL MPN PANEL (NGS)

## 2022-08-07 LAB — BCR ABL1 FISH (GENPATH)

## 2022-08-09 DIAGNOSIS — Z79899 Other long term (current) drug therapy: Secondary | ICD-10-CM | POA: Diagnosis not present

## 2022-08-09 DIAGNOSIS — H26493 Other secondary cataract, bilateral: Secondary | ICD-10-CM | POA: Diagnosis not present

## 2022-08-09 DIAGNOSIS — H35363 Drusen (degenerative) of macula, bilateral: Secondary | ICD-10-CM | POA: Diagnosis not present

## 2022-08-09 DIAGNOSIS — H04123 Dry eye syndrome of bilateral lacrimal glands: Secondary | ICD-10-CM | POA: Diagnosis not present

## 2022-08-12 DIAGNOSIS — D631 Anemia in chronic kidney disease: Secondary | ICD-10-CM | POA: Diagnosis not present

## 2022-08-12 DIAGNOSIS — Z87442 Personal history of urinary calculi: Secondary | ICD-10-CM | POA: Diagnosis not present

## 2022-08-12 DIAGNOSIS — I129 Hypertensive chronic kidney disease with stage 1 through stage 4 chronic kidney disease, or unspecified chronic kidney disease: Secondary | ICD-10-CM | POA: Diagnosis not present

## 2022-08-12 DIAGNOSIS — C50912 Malignant neoplasm of unspecified site of left female breast: Secondary | ICD-10-CM | POA: Diagnosis not present

## 2022-08-12 DIAGNOSIS — N1832 Chronic kidney disease, stage 3b: Secondary | ICD-10-CM | POA: Diagnosis not present

## 2022-08-12 DIAGNOSIS — D75839 Thrombocytosis, unspecified: Secondary | ICD-10-CM | POA: Diagnosis not present

## 2022-08-12 DIAGNOSIS — E559 Vitamin D deficiency, unspecified: Secondary | ICD-10-CM | POA: Diagnosis not present

## 2022-08-13 ENCOUNTER — Encounter: Payer: Self-pay | Admitting: *Deleted

## 2022-08-19 ENCOUNTER — Telehealth: Payer: Self-pay | Admitting: *Deleted

## 2022-08-19 NOTE — Telephone Encounter (Signed)
Mrs. Brummet called requesting to speak w/Dr. Benay Spice regarding recent lab results.Marland Kitchen

## 2022-08-26 DIAGNOSIS — E538 Deficiency of other specified B group vitamins: Secondary | ICD-10-CM | POA: Diagnosis not present

## 2022-09-20 DIAGNOSIS — E785 Hyperlipidemia, unspecified: Secondary | ICD-10-CM | POA: Diagnosis not present

## 2022-09-20 DIAGNOSIS — F5101 Primary insomnia: Secondary | ICD-10-CM | POA: Diagnosis not present

## 2022-09-20 DIAGNOSIS — N1832 Chronic kidney disease, stage 3b: Secondary | ICD-10-CM | POA: Diagnosis not present

## 2022-09-20 DIAGNOSIS — I25118 Atherosclerotic heart disease of native coronary artery with other forms of angina pectoris: Secondary | ICD-10-CM | POA: Diagnosis not present

## 2022-09-20 DIAGNOSIS — C50312 Malignant neoplasm of lower-inner quadrant of left female breast: Secondary | ICD-10-CM | POA: Diagnosis not present

## 2022-09-20 DIAGNOSIS — I10 Essential (primary) hypertension: Secondary | ICD-10-CM | POA: Diagnosis not present

## 2022-09-27 DIAGNOSIS — E538 Deficiency of other specified B group vitamins: Secondary | ICD-10-CM | POA: Diagnosis not present

## 2022-10-02 DIAGNOSIS — H52223 Regular astigmatism, bilateral: Secondary | ICD-10-CM | POA: Diagnosis not present

## 2022-10-02 DIAGNOSIS — H04123 Dry eye syndrome of bilateral lacrimal glands: Secondary | ICD-10-CM | POA: Diagnosis not present

## 2022-10-02 DIAGNOSIS — H35363 Drusen (degenerative) of macula, bilateral: Secondary | ICD-10-CM | POA: Diagnosis not present

## 2022-10-25 DIAGNOSIS — R051 Acute cough: Secondary | ICD-10-CM | POA: Diagnosis not present

## 2022-10-25 DIAGNOSIS — U071 COVID-19: Secondary | ICD-10-CM | POA: Diagnosis not present

## 2022-10-29 DIAGNOSIS — M170 Bilateral primary osteoarthritis of knee: Secondary | ICD-10-CM | POA: Diagnosis not present

## 2022-10-29 DIAGNOSIS — G8929 Other chronic pain: Secondary | ICD-10-CM | POA: Diagnosis not present

## 2022-10-29 DIAGNOSIS — N1832 Chronic kidney disease, stage 3b: Secondary | ICD-10-CM | POA: Diagnosis not present

## 2022-10-29 DIAGNOSIS — I10 Essential (primary) hypertension: Secondary | ICD-10-CM | POA: Diagnosis not present

## 2022-10-29 DIAGNOSIS — E785 Hyperlipidemia, unspecified: Secondary | ICD-10-CM | POA: Diagnosis not present

## 2022-10-29 DIAGNOSIS — E538 Deficiency of other specified B group vitamins: Secondary | ICD-10-CM | POA: Diagnosis not present

## 2022-10-29 DIAGNOSIS — I25119 Atherosclerotic heart disease of native coronary artery with unspecified angina pectoris: Secondary | ICD-10-CM | POA: Diagnosis not present

## 2022-10-31 DIAGNOSIS — H0014 Chalazion left upper eyelid: Secondary | ICD-10-CM | POA: Diagnosis not present

## 2022-11-04 ENCOUNTER — Other Ambulatory Visit: Payer: Self-pay | Admitting: Oncology

## 2022-11-19 DIAGNOSIS — N1832 Chronic kidney disease, stage 3b: Secondary | ICD-10-CM | POA: Diagnosis not present

## 2022-11-19 DIAGNOSIS — M5136 Other intervertebral disc degeneration, lumbar region: Secondary | ICD-10-CM | POA: Diagnosis not present

## 2022-11-19 DIAGNOSIS — Z6820 Body mass index (BMI) 20.0-20.9, adult: Secondary | ICD-10-CM | POA: Diagnosis not present

## 2022-11-19 DIAGNOSIS — R768 Other specified abnormal immunological findings in serum: Secondary | ICD-10-CM | POA: Diagnosis not present

## 2022-11-19 DIAGNOSIS — Z79899 Other long term (current) drug therapy: Secondary | ICD-10-CM | POA: Diagnosis not present

## 2022-11-19 DIAGNOSIS — M1991 Primary osteoarthritis, unspecified site: Secondary | ICD-10-CM | POA: Diagnosis not present

## 2022-11-19 DIAGNOSIS — M25511 Pain in right shoulder: Secondary | ICD-10-CM | POA: Diagnosis not present

## 2022-11-29 DIAGNOSIS — E538 Deficiency of other specified B group vitamins: Secondary | ICD-10-CM | POA: Diagnosis not present

## 2022-12-17 DIAGNOSIS — D510 Vitamin B12 deficiency anemia due to intrinsic factor deficiency: Secondary | ICD-10-CM | POA: Diagnosis not present

## 2022-12-17 DIAGNOSIS — R739 Hyperglycemia, unspecified: Secondary | ICD-10-CM | POA: Diagnosis not present

## 2022-12-17 DIAGNOSIS — M79671 Pain in right foot: Secondary | ICD-10-CM | POA: Diagnosis not present

## 2022-12-17 DIAGNOSIS — N1832 Chronic kidney disease, stage 3b: Secondary | ICD-10-CM | POA: Diagnosis not present

## 2022-12-30 DIAGNOSIS — E538 Deficiency of other specified B group vitamins: Secondary | ICD-10-CM | POA: Diagnosis not present

## 2023-01-21 DIAGNOSIS — H0012 Chalazion right lower eyelid: Secondary | ICD-10-CM | POA: Diagnosis not present

## 2023-01-31 DIAGNOSIS — E538 Deficiency of other specified B group vitamins: Secondary | ICD-10-CM | POA: Diagnosis not present

## 2023-02-03 DIAGNOSIS — N189 Chronic kidney disease, unspecified: Secondary | ICD-10-CM | POA: Diagnosis not present

## 2023-02-03 DIAGNOSIS — N1832 Chronic kidney disease, stage 3b: Secondary | ICD-10-CM | POA: Diagnosis not present

## 2023-02-10 ENCOUNTER — Other Ambulatory Visit: Payer: Self-pay

## 2023-02-10 DIAGNOSIS — Z87442 Personal history of urinary calculi: Secondary | ICD-10-CM | POA: Diagnosis not present

## 2023-02-10 DIAGNOSIS — C50912 Malignant neoplasm of unspecified site of left female breast: Secondary | ICD-10-CM | POA: Diagnosis not present

## 2023-02-10 DIAGNOSIS — N1832 Chronic kidney disease, stage 3b: Secondary | ICD-10-CM | POA: Diagnosis not present

## 2023-02-10 DIAGNOSIS — E559 Vitamin D deficiency, unspecified: Secondary | ICD-10-CM | POA: Diagnosis not present

## 2023-02-10 DIAGNOSIS — I129 Hypertensive chronic kidney disease with stage 1 through stage 4 chronic kidney disease, or unspecified chronic kidney disease: Secondary | ICD-10-CM | POA: Diagnosis not present

## 2023-02-10 DIAGNOSIS — D75839 Thrombocytosis, unspecified: Secondary | ICD-10-CM | POA: Diagnosis not present

## 2023-02-10 DIAGNOSIS — D631 Anemia in chronic kidney disease: Secondary | ICD-10-CM | POA: Diagnosis not present

## 2023-02-10 MED ORDER — ISOSORBIDE MONONITRATE ER 60 MG PO TB24: 60.0000 mg | ORAL_TABLET | Freq: Every day | ORAL | 0 refills | Status: DC

## 2023-02-18 ENCOUNTER — Other Ambulatory Visit: Payer: Medicare Other

## 2023-02-18 ENCOUNTER — Ambulatory Visit: Payer: Medicare Other | Admitting: Oncology

## 2023-03-04 ENCOUNTER — Inpatient Hospital Stay (HOSPITAL_BASED_OUTPATIENT_CLINIC_OR_DEPARTMENT_OTHER): Payer: Medicare Other | Admitting: Oncology

## 2023-03-04 ENCOUNTER — Encounter: Payer: Self-pay | Admitting: Cardiology

## 2023-03-04 ENCOUNTER — Ambulatory Visit (INDEPENDENT_AMBULATORY_CARE_PROVIDER_SITE_OTHER): Payer: Medicare Other | Admitting: Cardiology

## 2023-03-04 ENCOUNTER — Inpatient Hospital Stay: Payer: Medicare Other | Attending: Oncology

## 2023-03-04 VITALS — BP 139/58 | HR 63 | Temp 98.2°F | Resp 18 | Ht 66.0 in | Wt 121.8 lb

## 2023-03-04 VITALS — BP 140/66 | HR 56 | Ht 66.0 in | Wt 122.0 lb

## 2023-03-04 DIAGNOSIS — I10 Essential (primary) hypertension: Secondary | ICD-10-CM | POA: Diagnosis not present

## 2023-03-04 DIAGNOSIS — Z1509 Genetic susceptibility to other malignant neoplasm: Secondary | ICD-10-CM | POA: Insufficient documentation

## 2023-03-04 DIAGNOSIS — D75839 Thrombocytosis, unspecified: Secondary | ICD-10-CM | POA: Diagnosis not present

## 2023-03-04 DIAGNOSIS — I2510 Atherosclerotic heart disease of native coronary artery without angina pectoris: Secondary | ICD-10-CM | POA: Diagnosis not present

## 2023-03-04 DIAGNOSIS — Z1501 Genetic susceptibility to malignant neoplasm of breast: Secondary | ICD-10-CM | POA: Diagnosis not present

## 2023-03-04 DIAGNOSIS — E538 Deficiency of other specified B group vitamins: Secondary | ICD-10-CM | POA: Diagnosis not present

## 2023-03-04 DIAGNOSIS — C50912 Malignant neoplasm of unspecified site of left female breast: Secondary | ICD-10-CM | POA: Diagnosis not present

## 2023-03-04 DIAGNOSIS — Z79811 Long term (current) use of aromatase inhibitors: Secondary | ICD-10-CM | POA: Diagnosis not present

## 2023-03-04 DIAGNOSIS — Z170 Estrogen receptor positive status [ER+]: Secondary | ICD-10-CM | POA: Diagnosis not present

## 2023-03-04 LAB — CBC WITH DIFFERENTIAL (CANCER CENTER ONLY)
Abs Immature Granulocytes: 0.09 10*3/uL — ABNORMAL HIGH (ref 0.00–0.07)
Basophils Absolute: 0.2 10*3/uL — ABNORMAL HIGH (ref 0.0–0.1)
Basophils Relative: 2 %
Eosinophils Absolute: 0.6 10*3/uL — ABNORMAL HIGH (ref 0.0–0.5)
Eosinophils Relative: 5 %
HCT: 34.6 % — ABNORMAL LOW (ref 36.0–46.0)
Hemoglobin: 11.7 g/dL — ABNORMAL LOW (ref 12.0–15.0)
Immature Granulocytes: 1 %
Lymphocytes Relative: 9 %
Lymphs Abs: 1.2 10*3/uL (ref 0.7–4.0)
MCH: 31.6 pg (ref 26.0–34.0)
MCHC: 33.8 g/dL (ref 30.0–36.0)
MCV: 93.5 fL (ref 80.0–100.0)
Monocytes Absolute: 0.6 10*3/uL (ref 0.1–1.0)
Monocytes Relative: 5 %
Neutro Abs: 10.2 10*3/uL — ABNORMAL HIGH (ref 1.7–7.7)
Neutrophils Relative %: 78 %
Platelet Count: 484 10*3/uL — ABNORMAL HIGH (ref 150–400)
RBC: 3.7 MIL/uL — ABNORMAL LOW (ref 3.87–5.11)
RDW: 14.5 % (ref 11.5–15.5)
WBC Count: 13 10*3/uL — ABNORMAL HIGH (ref 4.0–10.5)
nRBC: 0 % (ref 0.0–0.2)

## 2023-03-04 MED ORDER — HYDROXYUREA 500 MG PO CAPS: 500.0000 mg | ORAL_CAPSULE | ORAL | 1 refills | Status: DC

## 2023-03-04 NOTE — Patient Instructions (Addendum)
Medication Instructions:  The current medical regimen is effective;  continue present plan and medications.  *If you need a refill on your cardiac medications before your next appointment, please call your pharmacy*   Follow-Up: At [facility], you and your health needs are our priority.  As part of our continuing mission to provide you with exceptional heart care, we have created designated Provider Care Teams.  These Care Teams include your primary Cardiologist (physician) and Advanced Practice Providers (APPs -  Physician Assistants and Nurse Practitioners) who all work together to provide you with the care you need, when you need it.  We recommend signing up for the patient portal called "MyChart".  Sign up information is provided on this After Visit Summary.  MyChart is used to connect with patients for Virtual Visits (Telemedicine).  Patients are able to view lab/test results, encounter notes, upcoming appointments, etc.  Non-urgent messages can be sent to your provider as well.   To learn more about what you can do with MyChart, go to [email or web address].    Your next appointment:   1 year(s)  Provider:   Dr Mark Skains      

## 2023-03-04 NOTE — Progress Notes (Signed)
[facility] OFFICE PROGRESS NOTE   Diagnosis: Breast cancer, thrombocytosis  INTERVAL HISTORY:   [name] returns as scheduled.  She continues anastrozole.  No hot flashes.  She has chronic back pain and arthralgias.  No bleeding or symptom of thrombosis.  She takes aspirin daily.  Objective:  Vital signs in last 24 hours:  Blood pressure (!) 139/58, pulse 63, temperature 98.2 F (36.8 C), temperature source Oral, resp. rate 18, height 5\' 6"  (1.676 m), weight 121 lb 12.8 oz (55.2 kg), last menstrual period 10/07/1974, SpO2 100 %.     Lymphatics: No cervical, supraclavicular, axillary, or inguinal nodes Resp: Lungs clear bilaterally Cardio: Regular rate and rhythm GI: No hepatosplenomegaly Vascular: No leg edema Breast: Bilateral lumpectomies.  No evidence of local tumor recurrence.  No mass in either breast   Lab Results:  Lab Results  Component Value Date   WBC 13.0 (H) 03/04/2023   HGB 11.7 (L) 03/04/2023   HCT 34.6 (L) 03/04/2023   MCV 93.5 03/04/2023   PLT 484 (H) 03/04/2023   NEUTROABS 10.2 (H) 03/04/2023    CMP  Lab Results  Component Value Date   NA 138 02/03/2022   K 3.2 (L) 02/03/2022   CL 108 02/03/2022   CO2 21 (L) 02/03/2022   GLUCOSE 121 (H) 02/03/2022   BUN 35 (H) 02/03/2022   CREATININE 1.79 (H) 02/03/2022   CALCIUM 8.5 (L) 02/03/2022   PROT 6.6 02/01/2022   ALBUMIN 3.7 02/01/2022   AST 14 (L) 02/01/2022   ALT 14 02/01/2022   ALKPHOS 97 02/01/2022   BILITOT 0.7 02/01/2022   GFRNONAA 27 (L) 02/03/2022   GFRAA 44 (L) 06/07/2019    Lab Results  Component Value Date   CA125 12 07/15/2016     Medications: I have reviewed the patient's current medications.   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  Status post a needle localized lumpectomy and sentinel lymph node biopsy 04/13/2009 with the pathology confirming high-grade ductal carcinoma in situ with an associated 0.26 mm invasive carcinoma, ER positive, PR positive, and HER2 positive. Status post adjuvant right breast radiation completed 06/19/2009. Initiation of Arimidex 05/01/2009.  Completed 5 years in July 2015 Family history of breast cancer. History of Clostridium difficile colitis. Frequent bowel movements following the bowel resection in 2009. 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  She will continue anastrozole.  She has persistent thrombocytosis.  The myeloproliferative panel in October is positive for a JAK2 mutation.  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  We discussed the diagnosis.  I explained the increased risk of thromboembolic disease in patients with essential thrombocytosis and an elevated platelet count.  She reports no previous history of venous or arterial thrombosis.  She is an increased risk for thrombosis at her age.  We discussed the risk versus benefit of platelet lowering therapy.  She is most  comfortable proceeding with a trial of hydroxyurea.  We reviewed potential toxicities associated with hydroxyurea including the chance of hematologic toxicity, infection, bleeding, mucositis, rash, and hepatic toxicity.  She agrees to proceed.  She will begin hydroxyurea at a dose of 500 mg on Monday, Wednesday, and Friday.  She will return for an office and lab visit in 4 weeks.  Thornton Papas, MD  03/04/2023  12:52 PM

## 2023-03-04 NOTE — Progress Notes (Signed)
Cardiology Office Note:    Date:  03/04/2023   ID:  [name], DOB 03/26/1934, MRN [id number]  PCP:  Lorenda Ishihara, MD   [facility] Providers Cardiologist:  Donato Schultz, MD     Referring MD: Lorenda Ishihara,*    History of Present Illness:    [name] is a 89 y.o. female former patient of Dr. Verdis Prime is here for follow-up of moderate CAD hypertension hyperlipidemia systolic heart failure left carotid bruit.  Has had discomfort off and on over the years with chest tightness.  Infrequent.  Not particularly exertional.  She has a 60 year old granddaughter.  1 daughter.  Enjoys watching her grow up.  Overall doing quite well.  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   Breast cancer (HCC) 2010   RIGHT   CAD (coronary artery disease)    Dysuria    Family history of breast cancer    GERD (gastroesophageal reflux disease)    HX OF   Heart murmur    History of blood transfusion    History of breast cancer ONCOLOGIST-- DR Truett Perna   DX 2010--  RIGHT BREAST DCIS  HIGH GRADE S/P LUMPECTOMY W/ SLN BX--  NO RECURRENCE   History of kidney stones    History of ovarian cancer    2009--  S/P COLON RESECTION FOR PAPILLARY SEROUS CARCINOMA, BORDERLINE OVARIAN CANCER VERSUS PRIMARY PERITONEAL CARCINOMA WITH PSAMMOMA BODIES--  NO RECURRENCE   History of skin cancer    excision basal cell   Hyperlipidemia    Hypertension    Kidney stones 7/14   Nocturia    Ovarian carcinoma (HCC)    papillary serous-low grade, recurrence found in fat necrosis during ileostomy   Renal stones 2014   Shingles 30 YRS AGO   Ureteral calculi    bilateral    Past Surgical History:  Procedure Laterality Date   APPENDECTOMY     BREAST LUMPECTOMY Right 04-13-2009   W/ SLN BX   BREAST LUMPECTOMY WITH RADIOACTIVE SEED LOCALIZATION Left 06/11/2019   Procedure: LEFT BREAST LUMPECTOMY WITH  RADIOACTIVE SEED LOCALIZATION;  Surgeon: Griselda Miner, MD;  Location: [facility] OR;  Service: General;  Laterality: Left;   CARDIAC CATHETERIZATION  11-11-2005 DR Verdis Prime   MODERATE LAD DISEASE/ NORMAL LVF/ EF 65-75%   CATARACT EXTRACTION W/ INTRAOCULAR LENS IMPLANT Bilateral 04/26/2013   CHOLECYSTECTOMY  1990   OPEN   COLONOSCOPY     CYSTOSCOPY W/ URETERAL STENT PLACEMENT Bilateral 05/07/2013   Procedure: CYSTOSCOPY WITH STENT REPLACEMENTS;  Surgeon: Sebastian Ache, MD;  Location: [facility];  Service: Urology;  Laterality: Bilateral;   CYSTOSCOPY WITH BIOPSY N/A 04/07/2013   Procedure: CYSTOSCOPY WITH BIOPSY and fulgeration;  Surgeon: Sebastian Ache, MD;  Location: [facility];  Service: Urology;  Laterality: N/A;   CYSTOSCOPY WITH RETROGRADE PYELOGRAM, URETEROSCOPY AND STENT PLACEMENT Bilateral 05/07/2013   Procedure: CYSTOSCOPY WITH RETROGRADE PYELOGRAM, URETEROSCOPY ;  Surgeon: Sebastian Ache, MD;  Location: [facility];  Service: Urology;  Laterality: Bilateral;   CYSTOSCOPY WITH RETROGRADE PYELOGRAM, URETEROSCOPY AND STENT PLACEMENT Bilateral 01/06/2019   Procedure: CYSTOSCOPY WITH RETROGRADE PYELOGRAM, URETEROSCOPY AND STENT PLACEMENT, FIRST STAGE;  Surgeon: Sebastian Ache, MD;  Location: [facility];  Service: Urology;  Laterality: Bilateral;   CYSTOSCOPY WITH RETROGRADE PYELOGRAM, URETEROSCOPY AND STENT PLACEMENT Bilateral 01/27/2019   Procedure: CYSTOSCOPY WITH RETROGRADE PYELOGRAM, URETEROSCOPY AND STENT PLACEMENT;  Surgeon:  Sebastian Ache, MD;  Location: [facility];  Service: Urology;  Laterality: Bilateral;  75 MINS   CYSTOSCOPY WITH STENT PLACEMENT Bilateral 04/07/2013   Procedure: CYSTOSCOPY WITH STENT PLACEMENT;  Surgeon: Sebastian Ache, MD;  Location: [facility];  Service: Urology;  Laterality: Bilateral;   DX LAPAROSCOPY W/ PERITONEAL AND OMENTAL BX'S AND WASHINGS  02-16-2008   EXCISION RIGHT BREAST MASS  02-10-2002   EXP. LAP. EXTENSIVE ADHESIOLYSIS/ RESECTION TERMINAL  ILEUM , ASCENDING AND DESCENDING COLON WITH CREATION ILEOSTOMY AND MUCOUS FISTULA  02-19-2008   PERFERATION AND ABD. CANCER--  TAKEDOWN ILEOSTOMY 08-23-2008   HOLMIUM LASER APPLICATION Bilateral 01/06/2019   Procedure: HOLMIUM LASER APPLICATION;  Surgeon: Sebastian Ache, MD;  Location: [facility];  Service: Urology;  Laterality: Bilateral;   HOLMIUM LASER APPLICATION Bilateral 01/27/2019   Procedure: HOLMIUM LASER APPLICATION;  Surgeon: Sebastian Ache, MD;  Location: [facility];  Service: Urology;  Laterality: Bilateral;   I & D EXTREMITY Right 02/02/2022   Procedure: IRRIGATION AND DEBRIDEMENT HAND;  Surgeon: Bradly Bienenstock, MD;  Location: [facility] OR;  Service: Orthopedics;  Laterality: Right;   ILEOSTOMY CLOSURE  08/2008   TOTAL ABDOMINAL HYSTERECTOMY W/ BILATERAL SALPINGOOPHORECTOMY  1976   TOTAL ABDOMINAL HYSTERECTOMY W/ BILATERAL SALPINGOOPHORECTOMY  1976   VULVAR LESION REMOVAL N/A 08/22/2014   Procedure: EXCISION OF VULVAR CYST  ;  Surgeon: Annamaria Boots, MD;  Location: [facility];  Service: Gynecology;  Laterality: N/A;   WOUND DEBRIDEMENT  05/28/2012   Procedure: DEBRIDEMENT ABDOMINAL WOUND;  Surgeon: Currie Paris, MD;  Location: [facility];  Service: General;  Laterality: N/A;  excision chronic wound abdominal wall    Current Medications: Current Meds  Medication Sig   amLODipine (NORVASC) 10 MG tablet Take 1 tablet (10 mg total) by mouth daily. (Patient taking differently: Take 10 mg by mouth daily with lunch.)   anastrozole (ARIMIDEX) 1 MG tablet Take 1 tablet (1 mg total) by mouth daily with supper.   aspirin EC 81 MG tablet Take 81 mg by mouth daily.   BELSOMRA 10 MG TABS Take 10 mg by mouth at bedtime as needed (sleep).    Biotin 16109 MCG TABS Take 10,000 mcg by mouth daily with supper.   Calcium Carbonate-Vitamin D (CALTRATE 600+D PO) Take 1 tablet by mouth 2 (two) times daily.   carvedilol (COREG) 25 MG tablet Take 1 tablet (25 mg total) by mouth 2 (two) times daily  with a meal.   chlorthalidone (HYGROTON) 25 MG tablet Take 0.5 tablets (12.5 mg total) by mouth daily. (Patient taking differently: Take 12.5 mg by mouth in the morning and at bedtime.)   Cholecalciferol (VITAMIN D3) 50 MCG (2000 UT) TABS Take 2,000 Units by mouth in the morning and at bedtime.   hydrALAZINE (APRESOLINE) 10 MG tablet Take 1 tablet (10 mg total) by mouth in the morning and at bedtime.   hydroxychloroquine (PLAQUENIL) 200 MG tablet Take 200 mg by mouth daily.   [START ON 03/05/2023] hydroxyurea (HYDREA) 500 MG capsule Take 1 capsule (500 mg total) by mouth every Monday, Wednesday, and Friday. May take with food to minimize GI side effects.   isosorbide mononitrate (IMDUR) 60 MG 24 hr tablet Take 1 tablet (60 mg total) by mouth daily.   PROCTOZONE-HC 2.5 % rectal cream PLACE 1 APPLICATION RECTALLY 2 TIMES A DAY AS NEEDED FOR HEMORRHOIDS. (Patient taking differently: Place 1 application  rectally 2 (two) times daily as needed for hemorrhoids.)   rosuvastatin (CRESTOR) 10 MG tablet Take 1 tablet  by mouth every other day.   traMADol (ULTRAM) 50 MG tablet Take 50 mg by mouth daily as needed for moderate pain.   triamcinolone (KENALOG) 0.1 % Apply 1 application. topically as needed (rash).     Allergies:   Ramipril   Social History   Socioeconomic History   Marital status: Widowed    Spouse name: Not on file   Number of children: Not on file   Years of education: Not on file   Highest education level: Not on file  Occupational History   Not on file  Tobacco Use   Smoking status: Never   Smokeless tobacco: Never  Vaping Use   Vaping Use: Never used  Substance and Sexual Activity   Alcohol use: No   Drug use: No   Sexual activity: Never    Partners: Male    Birth control/protection: Surgical    Comment: Hysterectomy  Other Topics Concern   Not on file  Social History Narrative   Not on file   Social Determinants of Health   Financial Resource Strain: Not on file   Food Insecurity: Not on file  Transportation Needs: No Transportation Needs (05/17/2019)   PRAPARE - Transportation    Lack of Transportation (Medical): No    Lack of Transportation (Non-Medical): No  Physical Activity: Not on file  Stress: Not on file  Social Connections: Not on file     Family History: The patient's family history includes Breast cancer in her sister; Cancer in her sister; Heart disease in her mother; Hyperlipidemia in her mother.  ROS:   Please see the history of present illness.     All other systems reviewed and are negative.  EKGs/Labs/Other Studies Reviewed:    The following studies were reviewed today: Cardiac Studies & Procedures     STRESS TESTS  MYOCARDIAL PERFUSION IMAGING 01/24/2020  Narrative  The left ventricular ejection fraction is hyperdynamic (>65%).  Nuclear stress EF: 74%.  There was no ST segment deviation noted during stress.  This is a low risk study.  Small fixed anterior wall defect in anteroapex and mid anterior wall at stress and rest. With hyperdynamic EF and normal wall motion, this may be secondary to breast attenuation artifact, however cannot exclude small area of prior infarct. No ischemia noted , as rest perfusion defects are great than stress. Overall low risk study.   ECHOCARDIOGRAM  ECHOCARDIOGRAM COMPLETE 03/22/2022  Narrative ECHOCARDIOGRAM REPORT    Patient Name:   [name] Date of Exam: 03/22/2022 Medical Rec #:  [id number]        Height:       66.0 in Accession #:    [id number]       Weight:       127.2 lb Date of Birth:  1934-08-29       BSA:          1.650 m Patient Age:    87 years         BP:           132/60 mmHg Patient Gender: F                HR:           64 bpm. Exam Location:  [address]  Procedure: 2D Echo, Color Doppler, Cardiac Doppler and 3D Echo  Indications:    Systolic Murmur R01.1  History:        Patient has no prior history of Echocardiogram examinations. CAD; Risk  Factors:Hypertension  and Dyslipidemia.  Sonographer:    Thurman Coyer RDCS Referring Phys: [id number] Dienes [facility]  IMPRESSIONS   1. Left ventricular ejection fraction, by estimation, is 65 to 70%. The left ventricle has normal function. The left ventricle has no regional wall motion abnormalities. There is mild left ventricular hypertrophy of the basal-septal segment. Left ventricular diastolic parameters are consistent with Grade III diastolic dysfunction (restrictive). 2. Right ventricular systolic function is normal. The right ventricular size is normal. There is mildly elevated pulmonary artery systolic pressure. 3. Left atrial size was severely dilated. 4. Right atrial size was mildly dilated. 5. The mitral valve is normal in structure. Mild mitral valve regurgitation. No evidence of mitral stenosis. 6. The aortic valve is tricuspid. There is mild calcification of the aortic valve. Aortic valve regurgitation is not visualized. Aortic valve sclerosis/calcification is present, without any evidence of aortic stenosis. 7. The inferior vena cava is normal in size with greater than 50% respiratory variability, suggesting right atrial pressure of 3 mmHg.  FINDINGS Left Ventricle: Left ventricular ejection fraction, by estimation, is 65 to 70%. The left ventricle has normal function. The left ventricle has no regional wall motion abnormalities. The left ventricular internal cavity size was normal in size. There is mild left ventricular hypertrophy of the basal-septal segment. Left ventricular diastolic parameters are consistent with Grade III diastolic dysfunction (restrictive).  Right Ventricle: The right ventricular size is normal. No increase in right ventricular wall thickness. Right ventricular systolic function is normal. There is mildly elevated pulmonary artery systolic pressure. The tricuspid regurgitant velocity is 2.84 m/s, and with an assumed right atrial pressure of 8 mmHg, the  estimated right ventricular systolic pressure is 40.3 mmHg.  Left Atrium: Left atrial size was severely dilated.  Right Atrium: Right atrial size was mildly dilated.  Pericardium: There is no evidence of pericardial effusion.  Mitral Valve: The mitral valve is normal in structure. Mild mitral annular calcification. Mild mitral valve regurgitation. No evidence of mitral valve stenosis.  Tricuspid Valve: The tricuspid valve is normal in structure. Tricuspid valve regurgitation is mild . No evidence of tricuspid stenosis.  Aortic Valve: The aortic valve is tricuspid. There is mild calcification of the aortic valve. Aortic valve regurgitation is not visualized. Aortic valve sclerosis/calcification is present, without any evidence of aortic stenosis.  Pulmonic Valve: The pulmonic valve was normal in structure. Pulmonic valve regurgitation is trivial. No evidence of pulmonic stenosis.  Aorta: The aortic root is normal in size and structure.  Venous: The inferior vena cava is normal in size with greater than 50% respiratory variability, suggesting right atrial pressure of 3 mmHg.  IAS/Shunts: No atrial level shunt detected by color flow Doppler.   LEFT VENTRICLE PLAX 2D LVIDd:         3.70 cm   Diastology LVIDs:         2.10 cm   LV e' medial:    4.62 cm/s LV PW:         1.10 cm   LV E/e' medial:  24.7 LV IVS:        1.20 cm   LV e' lateral:   8.81 cm/s LVOT diam:     2.00 cm   LV E/e' lateral: 12.9 LV SV:         89 LV SV Index:   54 LVOT Area:     3.14 cm  3D Volume EF: 3D EF:        57 % LV EDV:  99 ml LV ESV:       42 ml LV SV:        57 ml  RIGHT VENTRICLE RV Basal diam:  4.00 cm RV Mid diam:    3.40 cm RV S prime:     12.90 cm/s TAPSE (M-mode): 2.1 cm  LEFT ATRIUM             Index        RIGHT ATRIUM           Index LA diam:        4.60 cm 2.79 cm/m   RA Area:     14.80 cm LA Vol (A2C):   57.0 ml 34.55 ml/m  RA Volume:   37.70 ml  22.85 ml/m LA Vol (A4C):    64.5 ml 39.09 ml/m LA Biplane Vol: 63.0 ml 38.18 ml/m AORTIC VALVE LVOT Vmax:   131.00 cm/s LVOT Vmean:  84.300 cm/s LVOT VTI:    0.284 m  AORTA Ao Root diam: 2.90 cm Ao Asc diam:  3.00 cm  MITRAL VALVE                TRICUSPID VALVE MV Area (PHT): 4.06 cm     TR Peak grad:   32.3 mmHg MV Decel Time: 187 msec     TR Vmax:        284.00 cm/s MV E velocity: 114.00 cm/s MV A velocity: 72.00 cm/s   SHUNTS MV E/A ratio:  1.58         Systemic VTI:  0.28 m Systemic Diam: 2.00 cm  Arvilla Meres MD Electronically signed by Arvilla Meres MD Signature Date/Time: 03/22/2022/4:39:57 PM    Final              EKG: 03/04/2023-sinus rhythm PACs and bigeminy pattern  Recent Labs: 03/04/2023: Hemoglobin 11.7; Platelet Count 484  Recent Lipid Panel    Component Value Date/Time   CHOL  02/29/2008 0515    61        ATP III CLASSIFICATION:  <200     mg/dL   Desirable  409-811  mg/dL   Borderline High  >=914    mg/dL   High   TRIG 782 95/62/1308 0515     Risk Assessment/Calculations:              Physical Exam:    VS:  BP (!) 140/66   Pulse (!) 56   Ht 5\' 6"  (1.676 m)   Wt 122 lb (55.3 kg)   LMP 10/07/1974   SpO2 95%   BMI 19.69 kg/m     Wt Readings from Last 3 Encounters:  03/04/23 122 lb (55.3 kg)  03/04/23 121 lb 12.8 oz (55.2 kg)  06/25/22 126 lb (57.2 kg)     GEN:  Well nourished, well developed in no acute distress HEENT: Normal NECK: No JVD; No carotid bruits LYMPHATICS: No lymphadenopathy CARDIAC: Ectopy RRR, 2/6 systolic murmur, no rubs, gallops RESPIRATORY:  Clear to auscultation without rales, wheezing or rhonchi  ABDOMEN: Soft, non-tender, non-distended MUSCULOSKELETAL:  No edema; No deformity  SKIN: Warm and dry NEUROLOGIC:  Alert and oriented x 3 PSYCHIATRIC:  Normal affect   ASSESSMENT:    1. Coronary artery disease involving native coronary artery of native heart without angina pectoris   2. Primary hypertension    PLAN:    In  order of problems listed above:  Intermittent chest pain -Suspected microvascular disease.  Uses nitroglycerin. Lay down on back.  Could be  GERD, back related.  Musculoskeletal.  Hypertension - Low-salt diet physical activity  Carotid stenosis - Left neck bruit 2023 greater than 50%, no change from prior  Hyperlipidemia - Last LDL less than 70, in the 30s  Systolic murmur - No evidence of significant aortic stenosis.  Premature atrial contractions - Atrial bigeminy noted on ECG.  Stable.  Sometimes she will feel flutters.          Medication Adjustments/Labs and Tests Ordered: Current medicines are reviewed at length with the patient today.  Concerns regarding medicines are outlined above.  Orders Placed This Encounter  Procedures   EKG 12-Lead   No orders of the defined types were placed in this encounter.   Patient Instructions  Medication Instructions:  The current medical regimen is effective;  continue present plan and medications.  *If you need a refill on your cardiac medications before your next appointment, please call your pharmacy*  Follow-Up: At [facility], you and your health needs are our priority.  As part of our continuing mission to provide you with exceptional heart care, we have created designated Provider Care Teams.  These Care Teams include your primary Cardiologist (physician) and Advanced Practice Providers (APPs -  Physician Assistants and Nurse Practitioners) who all work together to provide you with the care you need, when you need it.  We recommend signing up for the patient portal called "MyChart".  Sign up information is provided on this After Visit Summary.  MyChart is used to connect with patients for Virtual Visits (Telemedicine).  Patients are able to view lab/test results, encounter notes, upcoming appointments, etc.  Non-urgent messages can be sent to your provider as well.   To learn more about what you can do with MyChart,  go to [email or web address].    Your next appointment:   1 year(s)  Provider:   Dr Donato Schultz      Signed, Donato Schultz, MD  03/04/2023 3:33 PM    [facility]

## 2023-03-05 ENCOUNTER — Telehealth: Payer: Self-pay | Admitting: Oncology

## 2023-03-05 NOTE — Telephone Encounter (Signed)
TC to primary providers office to request most recent cmp or bmp. Last cmp was 12/23. Waiting for results to be faxed.

## 2023-03-11 DIAGNOSIS — Z85828 Personal history of other malignant neoplasm of skin: Secondary | ICD-10-CM | POA: Diagnosis not present

## 2023-03-11 DIAGNOSIS — S50862A Insect bite (nonvenomous) of left forearm, initial encounter: Secondary | ICD-10-CM | POA: Diagnosis not present

## 2023-03-11 DIAGNOSIS — S20461A Insect bite (nonvenomous) of right back wall of thorax, initial encounter: Secondary | ICD-10-CM | POA: Diagnosis not present

## 2023-04-04 ENCOUNTER — Inpatient Hospital Stay: Payer: Medicare Other | Attending: Oncology

## 2023-04-04 ENCOUNTER — Inpatient Hospital Stay (HOSPITAL_BASED_OUTPATIENT_CLINIC_OR_DEPARTMENT_OTHER): Payer: Medicare Other | Admitting: Oncology

## 2023-04-04 VITALS — BP 142/54 | HR 96 | Temp 98.1°F | Resp 18 | Ht 66.0 in | Wt 117.7 lb

## 2023-04-04 DIAGNOSIS — Z1509 Genetic susceptibility to other malignant neoplasm: Secondary | ICD-10-CM | POA: Insufficient documentation

## 2023-04-04 DIAGNOSIS — D75839 Thrombocytosis, unspecified: Secondary | ICD-10-CM

## 2023-04-04 DIAGNOSIS — Z1501 Genetic susceptibility to malignant neoplasm of breast: Secondary | ICD-10-CM | POA: Diagnosis not present

## 2023-04-04 DIAGNOSIS — Z853 Personal history of malignant neoplasm of breast: Secondary | ICD-10-CM | POA: Insufficient documentation

## 2023-04-04 DIAGNOSIS — Z08 Encounter for follow-up examination after completed treatment for malignant neoplasm: Secondary | ICD-10-CM | POA: Insufficient documentation

## 2023-04-04 DIAGNOSIS — D473 Essential (hemorrhagic) thrombocythemia: Secondary | ICD-10-CM | POA: Diagnosis not present

## 2023-04-04 LAB — CMP (CANCER CENTER ONLY)
ALT: 62 U/L — ABNORMAL HIGH (ref 0–44)
AST: 24 U/L (ref 15–41)
Albumin: 3.5 g/dL (ref 3.5–5.0)
Alkaline Phosphatase: 90 U/L (ref 38–126)
Anion gap: 8 (ref 5–15)
BUN: 34 mg/dL — ABNORMAL HIGH (ref 8–23)
CO2: 24 mmol/L (ref 22–32)
Calcium: 9.1 mg/dL (ref 8.9–10.3)
Chloride: 109 mmol/L (ref 98–111)
Creatinine: 1.42 mg/dL — ABNORMAL HIGH (ref 0.44–1.00)
GFR, Estimated: 36 mL/min — ABNORMAL LOW (ref >60)
Glucose, Bld: 121 mg/dL — ABNORMAL HIGH (ref 70–99)
Potassium: 4.2 mmol/L (ref 3.5–5.1)
Sodium: 141 mmol/L (ref 135–145)
Total Bilirubin: 0.6 mg/dL (ref 0.3–1.2)
Total Protein: 5.9 g/dL — ABNORMAL LOW (ref 6.5–8.1)

## 2023-04-04 LAB — CBC WITH DIFFERENTIAL (CANCER CENTER ONLY)
Abs Immature Granulocytes: 0.05 10*3/uL (ref 0.00–0.07)
Basophils Absolute: 0.2 10*3/uL — ABNORMAL HIGH (ref 0.0–0.1)
Basophils Relative: 2 %
Eosinophils Absolute: 0.4 10*3/uL (ref 0.0–0.5)
Eosinophils Relative: 4 %
HCT: 36.5 % (ref 36.0–46.0)
Hemoglobin: 12.1 g/dL (ref 12.0–15.0)
Immature Granulocytes: 0 %
Lymphocytes Relative: 8 %
Lymphs Abs: 1 10*3/uL (ref 0.7–4.0)
MCH: 31.3 pg (ref 26.0–34.0)
MCHC: 33.2 g/dL (ref 30.0–36.0)
MCV: 94.3 fL (ref 80.0–100.0)
Monocytes Absolute: 0.6 10*3/uL (ref 0.1–1.0)
Monocytes Relative: 5 %
Neutro Abs: 9.5 10*3/uL — ABNORMAL HIGH (ref 1.7–7.7)
Neutrophils Relative %: 81 %
Platelet Count: 338 10*3/uL (ref 150–400)
RBC: 3.87 MIL/uL (ref 3.87–5.11)
RDW: 16 % — ABNORMAL HIGH (ref 11.5–15.5)
WBC Count: 11.7 10*3/uL — ABNORMAL HIGH (ref 4.0–10.5)
nRBC: 0 % (ref 0.0–0.2)

## 2023-04-04 NOTE — Progress Notes (Signed)
  [facility] OFFICE PROGRESS NOTE   Diagnosis: Breast cancer, essential thrombocytosis  INTERVAL HISTORY:   [name] began hydroxyurea 03/05/2023.  No mouth sores, nausea, or rash.  No bleeding or symptom of thrombosis.  She continues Arimidex for breast cancer.  No hot flashes.  Objective:  Vital signs in last 24 hours:  Blood pressure (!) 142/54, pulse 96, temperature 98.1 F (36.7 C), temperature source Oral, resp. rate 18, height 5\' 6"  (1.676 m), weight 117 lb 11.2 oz (53.4 kg), last menstrual period 10/07/1974, SpO2 100 %.    HEENT: Single ulcer at the right side of the tongue, no thrush Resp: Lungs clear bilaterally Cardio: Regular rate and rhythm GI: No hepatosplenomegaly Vascular: No leg edema   Lab Results:  Lab Results  Component Value Date   WBC 11.7 (H) 04/04/2023   HGB 12.1 04/04/2023   HCT 36.5 04/04/2023   MCV 94.3 04/04/2023   PLT 338 04/04/2023   NEUTROABS 9.5 (H) 04/04/2023    CMP  Lab Results  Component Value Date   NA 141 04/04/2023   K 4.2 04/04/2023   CL 109 04/04/2023   CO2 24 04/04/2023   GLUCOSE 121 (H) 04/04/2023   BUN 34 (H) 04/04/2023   CREATININE 1.42 (H) 04/04/2023   CALCIUM 9.1 04/04/2023   PROT 5.9 (L) 04/04/2023   ALBUMIN 3.5 04/04/2023   AST 24 04/04/2023   ALT 62 (H) 04/04/2023   ALKPHOS 90 04/04/2023   BILITOT 0.6 04/04/2023   GFRNONAA 36 (L) 04/04/2023   GFRAA 44 (L) 06/07/2019    Lab Results  Component Value Date   CA125 12 07/15/2016      Medications: I have reviewed the patient's current medications.   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  Status post a needle localized lumpectomy and sentinel lymph node biopsy 04/13/2009 with the pathology confirming high-grade ductal carcinoma in situ with an associated 0.26 mm invasive carcinoma, ER positive, PR positive, and HER2 positive. Status post adjuvant right breast radiation completed 06/19/2009. Initiation of Arimidex 05/01/2009.  Completed 5 years in July 2015 Family history of breast cancer. History of Clostridium difficile colitis. Frequent bowel movements following the bowel resection in 2009. 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  She is tolerating the hydroxyurea well.  The platelet count has decreased into goal range.  She will continue hydroxyurea at the current dose.  She will return for an office and lab visit in approximately 6 weeks.  She will call for development of new oral ulcers.  Thornton Papas, MD  04/04/2023  11:58 AM

## 2023-04-07 DIAGNOSIS — M85859 Other specified disorders of bone density and structure, unspecified thigh: Secondary | ICD-10-CM | POA: Diagnosis not present

## 2023-04-07 DIAGNOSIS — D510 Vitamin B12 deficiency anemia due to intrinsic factor deficiency: Secondary | ICD-10-CM | POA: Diagnosis not present

## 2023-04-07 DIAGNOSIS — I10 Essential (primary) hypertension: Secondary | ICD-10-CM | POA: Diagnosis not present

## 2023-04-07 DIAGNOSIS — C50312 Malignant neoplasm of lower-inner quadrant of left female breast: Secondary | ICD-10-CM | POA: Diagnosis not present

## 2023-04-07 DIAGNOSIS — Z0000 Encounter for general adult medical examination without abnormal findings: Secondary | ICD-10-CM | POA: Diagnosis not present

## 2023-04-07 DIAGNOSIS — Z1331 Encounter for screening for depression: Secondary | ICD-10-CM | POA: Diagnosis not present

## 2023-04-07 DIAGNOSIS — E7800 Pure hypercholesterolemia, unspecified: Secondary | ICD-10-CM | POA: Diagnosis not present

## 2023-04-17 ENCOUNTER — Encounter: Payer: Self-pay | Admitting: Oncology

## 2023-05-13 ENCOUNTER — Inpatient Hospital Stay (HOSPITAL_BASED_OUTPATIENT_CLINIC_OR_DEPARTMENT_OTHER): Payer: Medicare Other | Admitting: Oncology

## 2023-05-13 ENCOUNTER — Inpatient Hospital Stay: Payer: Medicare Other | Attending: Oncology

## 2023-05-13 VITALS — BP 131/58 | HR 74 | Temp 98.2°F | Resp 18 | Ht 66.0 in | Wt 117.1 lb

## 2023-05-13 DIAGNOSIS — D75839 Thrombocytosis, unspecified: Secondary | ICD-10-CM

## 2023-05-13 DIAGNOSIS — R5381 Other malaise: Secondary | ICD-10-CM | POA: Diagnosis not present

## 2023-05-13 DIAGNOSIS — Z1501 Genetic susceptibility to malignant neoplasm of breast: Secondary | ICD-10-CM | POA: Insufficient documentation

## 2023-05-13 DIAGNOSIS — D473 Essential (hemorrhagic) thrombocythemia: Secondary | ICD-10-CM | POA: Insufficient documentation

## 2023-05-13 DIAGNOSIS — R630 Anorexia: Secondary | ICD-10-CM | POA: Diagnosis not present

## 2023-05-13 DIAGNOSIS — Z1509 Genetic susceptibility to other malignant neoplasm: Secondary | ICD-10-CM | POA: Diagnosis not present

## 2023-05-13 DIAGNOSIS — R5383 Other fatigue: Secondary | ICD-10-CM

## 2023-05-13 DIAGNOSIS — Z853 Personal history of malignant neoplasm of breast: Secondary | ICD-10-CM | POA: Diagnosis not present

## 2023-05-13 LAB — CBC WITH DIFFERENTIAL (CANCER CENTER ONLY)
Abs Immature Granulocytes: 0.06 10*3/uL (ref 0.00–0.07)
Basophils Absolute: 0.1 10*3/uL (ref 0.0–0.1)
Basophils Relative: 1 %
Eosinophils Absolute: 0.1 10*3/uL (ref 0.0–0.5)
Eosinophils Relative: 1 %
HCT: 34 % — ABNORMAL LOW (ref 36.0–46.0)
Hemoglobin: 11.5 g/dL — ABNORMAL LOW (ref 12.0–15.0)
Immature Granulocytes: 1 %
Lymphocytes Relative: 3 %
Lymphs Abs: 0.4 10*3/uL — ABNORMAL LOW (ref 0.7–4.0)
MCH: 32.8 pg (ref 26.0–34.0)
MCHC: 33.8 g/dL (ref 30.0–36.0)
MCV: 96.9 fL (ref 80.0–100.0)
Monocytes Absolute: 0.7 10*3/uL (ref 0.1–1.0)
Monocytes Relative: 6 %
Neutro Abs: 10.9 10*3/uL — ABNORMAL HIGH (ref 1.7–7.7)
Neutrophils Relative %: 88 %
Platelet Count: 319 10*3/uL (ref 150–400)
RBC: 3.51 MIL/uL — ABNORMAL LOW (ref 3.87–5.11)
RDW: 16.9 % — ABNORMAL HIGH (ref 11.5–15.5)
WBC Count: 12.1 10*3/uL — ABNORMAL HIGH (ref 4.0–10.5)
nRBC: 0 % (ref 0.0–0.2)

## 2023-05-13 LAB — CMP (CANCER CENTER ONLY)
ALT: 60 U/L — ABNORMAL HIGH (ref 0–44)
AST: 20 U/L (ref 15–41)
Albumin: 3.6 g/dL (ref 3.5–5.0)
Alkaline Phosphatase: 266 U/L — ABNORMAL HIGH (ref 38–126)
Anion gap: 11 (ref 5–15)
BUN: 60 mg/dL — ABNORMAL HIGH (ref 8–23)
CO2: 20 mmol/L — ABNORMAL LOW (ref 22–32)
Calcium: 8.7 mg/dL — ABNORMAL LOW (ref 8.9–10.3)
Chloride: 105 mmol/L (ref 98–111)
Creatinine: 1.69 mg/dL — ABNORMAL HIGH (ref 0.44–1.00)
GFR, Estimated: 29 mL/min — ABNORMAL LOW (ref >60)
Glucose, Bld: 213 mg/dL — ABNORMAL HIGH (ref 70–99)
Potassium: 3.6 mmol/L (ref 3.5–5.1)
Sodium: 136 mmol/L (ref 135–145)
Total Bilirubin: 0.8 mg/dL (ref 0.3–1.2)
Total Protein: 6.2 g/dL — ABNORMAL LOW (ref 6.5–8.1)

## 2023-05-13 NOTE — Progress Notes (Signed)
[facility] OFFICE PROGRESS NOTE   Diagnosis: Essential thrombocytosis, breast cancer  INTERVAL HISTORY:   Ms. Mussen presents scheduled.  She reports increased malaise.  She had 2 episodes of becoming tremulous when getting up to use the bathroom at night.  She did not fall.  She has anorexia.  She reports the malaise predated the onset of hydroxyurea.  She wonders whether her symptoms are related to blood pressure medications.    Objective:  Vital signs in last 24 hours:  Blood pressure (!) 131/58, pulse 74, temperature 98.2 F (36.8 C), temperature source Oral, resp. rate 18, height 5\' 6"  (1.676 m), weight 117 lb 1.6 oz (53.1 kg), last menstrual period 10/07/1974, SpO2 98%.    HEENT: No thrush or ulcers Lymphatics: No cervical, supraclavicular, axillary, or inguinal nodes Resp: Lungs with scattered end inspiratory coarse rhonchi, no respiratory distress Cardiac: Regular rate and rhythm GI: No mass, nontender, no hepatosplenomegaly Breast: Bilateral lumpectomy scars without evidence of recurrent tumor Vascular: No leg edema    Lab Results:  Lab Results  Component Value Date   WBC 12.1 (H) 05/13/2023   HGB 11.5 (L) 05/13/2023   HCT 34.0 (L) 05/13/2023   MCV 96.9 05/13/2023   PLT 319 05/13/2023   NEUTROABS 10.9 (H) 05/13/2023    CMP  Lab Results  Component Value Date   NA 136 05/13/2023   K 3.6 05/13/2023   CL 105 05/13/2023   CO2 20 (L) 05/13/2023   GLUCOSE 213 (H) 05/13/2023   BUN 60 (H) 05/13/2023   CREATININE 1.69 (H) 05/13/2023   CALCIUM 8.7 (L) 05/13/2023   PROT 6.2 (L) 05/13/2023   ALBUMIN 3.6 05/13/2023   AST 20 05/13/2023   ALT 60 (H) 05/13/2023   ALKPHOS 266 (H) 05/13/2023   BILITOT 0.8 05/13/2023   GFRNONAA 29 (L) 05/13/2023   GFRAA 44 (L) 06/07/2019    Lab Results  Component Value Date   CA125 12 07/15/2016    Medications: I have reviewed the patient's current medications.   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  Status post a needle localized lumpectomy and sentinel lymph node biopsy 04/13/2009 with the pathology confirming high-grade ductal carcinoma in situ with an associated 0.26 mm invasive carcinoma, ER positive, PR positive, and HER2 positive. Status post adjuvant right breast radiation completed 06/19/2009. Initiation of Arimidex 05/01/2009.  Completed 5 years in July 2015 Family history of breast cancer. History of Clostridium difficile colitis. Frequent bowel movements following the bowel resection in 2009. 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  She continues anastrozole.  She reports anorexia and malaise.  I doubt the symptoms are related to  the essential thrombocytosis diagnosis or malignancy.  We attempted to add a TSH to her labs from today, but this could not be added.  I recommend she follow-up with her primary provider to  evaluate the malaise and recent episodes of nighttime tremors.  She will discuss her blood pressure regimen with the primary provider.  The alkaline phosphatase is elevated today.  This is of unclear significance.  She will return for an office and lab visit in approximately 5 weeks.  We will repeat a chemistry panel, TSH, and CBC at that visit.  She will continue hydroxyurea at the current dose.  The platelet count is in goal range.  Thornton Papas, MD  05/13/2023  12:00 PM

## 2023-05-15 ENCOUNTER — Other Ambulatory Visit: Payer: Self-pay | Admitting: Cardiology

## 2023-05-20 DIAGNOSIS — Z79899 Other long term (current) drug therapy: Secondary | ICD-10-CM | POA: Diagnosis not present

## 2023-05-20 DIAGNOSIS — Z681 Body mass index (BMI) 19 or less, adult: Secondary | ICD-10-CM | POA: Diagnosis not present

## 2023-05-20 DIAGNOSIS — M25511 Pain in right shoulder: Secondary | ICD-10-CM | POA: Diagnosis not present

## 2023-05-20 DIAGNOSIS — M1991 Primary osteoarthritis, unspecified site: Secondary | ICD-10-CM | POA: Diagnosis not present

## 2023-05-20 DIAGNOSIS — N1832 Chronic kidney disease, stage 3b: Secondary | ICD-10-CM | POA: Diagnosis not present

## 2023-05-20 DIAGNOSIS — M5136 Other intervertebral disc degeneration, lumbar region: Secondary | ICD-10-CM | POA: Diagnosis not present

## 2023-05-20 DIAGNOSIS — R768 Other specified abnormal immunological findings in serum: Secondary | ICD-10-CM | POA: Diagnosis not present

## 2023-06-03 DIAGNOSIS — Z8262 Family history of osteoporosis: Secondary | ICD-10-CM | POA: Diagnosis not present

## 2023-06-03 DIAGNOSIS — M8588 Other specified disorders of bone density and structure, other site: Secondary | ICD-10-CM | POA: Diagnosis not present

## 2023-06-03 DIAGNOSIS — Z853 Personal history of malignant neoplasm of breast: Secondary | ICD-10-CM | POA: Diagnosis not present

## 2023-06-03 DIAGNOSIS — Z1231 Encounter for screening mammogram for malignant neoplasm of breast: Secondary | ICD-10-CM | POA: Diagnosis not present

## 2023-06-17 ENCOUNTER — Other Ambulatory Visit: Payer: Self-pay | Admitting: *Deleted

## 2023-06-17 ENCOUNTER — Inpatient Hospital Stay (HOSPITAL_BASED_OUTPATIENT_CLINIC_OR_DEPARTMENT_OTHER): Payer: Medicare Other | Admitting: Oncology

## 2023-06-17 ENCOUNTER — Inpatient Hospital Stay: Payer: Medicare Other | Attending: Oncology

## 2023-06-17 ENCOUNTER — Inpatient Hospital Stay: Payer: Medicare Other

## 2023-06-17 ENCOUNTER — Other Ambulatory Visit: Payer: Self-pay | Admitting: Oncology

## 2023-06-17 VITALS — BP 121/57 | HR 58 | Temp 98.1°F | Resp 18 | Ht 66.0 in | Wt 116.3 lb

## 2023-06-17 DIAGNOSIS — R5383 Other fatigue: Secondary | ICD-10-CM

## 2023-06-17 DIAGNOSIS — Z170 Estrogen receptor positive status [ER+]: Secondary | ICD-10-CM | POA: Insufficient documentation

## 2023-06-17 DIAGNOSIS — Z23 Encounter for immunization: Secondary | ICD-10-CM | POA: Diagnosis not present

## 2023-06-17 DIAGNOSIS — R5381 Other malaise: Secondary | ICD-10-CM

## 2023-06-17 DIAGNOSIS — D75839 Thrombocytosis, unspecified: Secondary | ICD-10-CM

## 2023-06-17 DIAGNOSIS — C50912 Malignant neoplasm of unspecified site of left female breast: Secondary | ICD-10-CM | POA: Diagnosis not present

## 2023-06-17 DIAGNOSIS — R946 Abnormal results of thyroid function studies: Secondary | ICD-10-CM | POA: Diagnosis not present

## 2023-06-17 DIAGNOSIS — Z1501 Genetic susceptibility to malignant neoplasm of breast: Secondary | ICD-10-CM | POA: Insufficient documentation

## 2023-06-17 LAB — CBC WITH DIFFERENTIAL (CANCER CENTER ONLY)
Abs Immature Granulocytes: 0.07 10*3/uL (ref 0.00–0.07)
Basophils Absolute: 0.2 10*3/uL — ABNORMAL HIGH (ref 0.0–0.1)
Basophils Relative: 2 %
Eosinophils Absolute: 0.5 10*3/uL (ref 0.0–0.5)
Eosinophils Relative: 3 %
HCT: 36.4 % (ref 36.0–46.0)
Hemoglobin: 12 g/dL (ref 12.0–15.0)
Immature Granulocytes: 1 %
Lymphocytes Relative: 9 %
Lymphs Abs: 1.2 10*3/uL (ref 0.7–4.0)
MCH: 33.4 pg (ref 26.0–34.0)
MCHC: 33 g/dL (ref 30.0–36.0)
MCV: 101.4 fL — ABNORMAL HIGH (ref 80.0–100.0)
Monocytes Absolute: 0.7 10*3/uL (ref 0.1–1.0)
Monocytes Relative: 5 %
Neutro Abs: 11.4 10*3/uL — ABNORMAL HIGH (ref 1.7–7.7)
Neutrophils Relative %: 80 %
Platelet Count: 377 10*3/uL (ref 150–400)
RBC: 3.59 MIL/uL — ABNORMAL LOW (ref 3.87–5.11)
RDW: 15.9 % — ABNORMAL HIGH (ref 11.5–15.5)
WBC Count: 14 10*3/uL — ABNORMAL HIGH (ref 4.0–10.5)
nRBC: 0 % (ref 0.0–0.2)

## 2023-06-17 LAB — CMP (CANCER CENTER ONLY)
ALT: 63 U/L — ABNORMAL HIGH (ref 0–44)
AST: 48 U/L — ABNORMAL HIGH (ref 15–41)
Albumin: 3.9 g/dL (ref 3.5–5.0)
Alkaline Phosphatase: 106 U/L (ref 38–126)
Anion gap: 8 (ref 5–15)
BUN: 43 mg/dL — ABNORMAL HIGH (ref 8–23)
CO2: 23 mmol/L (ref 22–32)
Calcium: 9.3 mg/dL (ref 8.9–10.3)
Chloride: 110 mmol/L (ref 98–111)
Creatinine: 1.59 mg/dL — ABNORMAL HIGH (ref 0.44–1.00)
GFR, Estimated: 31 mL/min — ABNORMAL LOW (ref >60)
Glucose, Bld: 106 mg/dL — ABNORMAL HIGH (ref 70–99)
Potassium: 4.1 mmol/L (ref 3.5–5.1)
Sodium: 141 mmol/L (ref 135–145)
Total Bilirubin: 0.5 mg/dL (ref 0.3–1.2)
Total Protein: 6.6 g/dL (ref 6.5–8.1)

## 2023-06-17 LAB — TSH: TSH: 5.164 u[IU]/mL — ABNORMAL HIGH (ref 0.350–4.500)

## 2023-06-17 MED ORDER — INFLUENZA VAC A&B SURF ANT ADJ 0.5 ML IM SUSY
0.5000 mL | PREFILLED_SYRINGE | INTRAMUSCULAR | Status: AC
  Administered 2023-06-17: 0.5 mL via INTRAMUSCULAR
  Filled 2023-06-17: qty 0.5

## 2023-06-17 NOTE — Progress Notes (Signed)
[facility] OFFICE PROGRESS NOTE   Diagnosis: Breast cancer, thrombocytosis  INTERVAL HISTORY:   [name] returns as scheduled.  She continues hydroxyurea.  No bleeding or symptom of thrombosis.  She is taking Arimidex.  She had a recent mammogram (we have not received the report).  She reports improvement in malaise.  She is concentrating on eating a better diet and she has an Ensure approximately 5 times per week.  Objective:  Vital signs in last 24 hours:  Blood pressure (!) 121/57, pulse (!) 58, temperature 98.1 F (36.7 C), temperature source Oral, resp. rate 18, height 5\' 6"  (1.676 m), weight 116 lb 4.8 oz (52.8 kg), last menstrual period 10/07/1974, SpO2 99%.    HEENT: No thrush or ulcers Resp: Lungs clear bilaterally Cardio: Regular rhythm with premature beats GI: No hepatosplenomegaly Vascular: No leg edema   Lab Results:  Lab Results  Component Value Date   WBC 14.0 (H) 06/17/2023   HGB 12.0 06/17/2023   HCT 36.4 06/17/2023   MCV 101.4 (H) 06/17/2023   PLT 377 06/17/2023   NEUTROABS 11.4 (H) 06/17/2023    CMP  Lab Results  Component Value Date   NA 141 06/17/2023   K 4.1 06/17/2023   CL 110 06/17/2023   CO2 23 06/17/2023   GLUCOSE 106 (H) 06/17/2023   BUN 43 (H) 06/17/2023   CREATININE 1.59 (H) 06/17/2023   CALCIUM 9.3 06/17/2023   PROT 6.6 06/17/2023   ALBUMIN 3.9 06/17/2023   AST 48 (H) 06/17/2023   ALT 63 (H) 06/17/2023   ALKPHOS 106 06/17/2023   BILITOT 0.5 06/17/2023   GFRNONAA 31 (L) 06/17/2023   GFRAA 44 (L) 06/07/2019    Lab Results  Component Value Date   CA125 12 07/15/2016     Medications: I have reviewed the patient's current medications.   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  Status post a needle localized lumpectomy and sentinel lymph node biopsy 04/13/2009 with the pathology confirming high-grade ductal carcinoma in situ with an associated 0.26 mm invasive carcinoma, ER positive, PR positive, and HER2 positive. Status post adjuvant right breast radiation completed 06/19/2009. Initiation of Arimidex 05/01/2009.  Completed 5 years in July 2015 Family history of breast cancer. History of Clostridium difficile colitis. Frequent bowel movements following the bowel resection in 2009. 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 appears stable.  Her weight has stabilized over the past several months and she reports an improved energy level.  The TSH is mildly elevated.  We will check a T4 level.  The platelet count is in goal range.  She will continue hydroxyurea at the current dose.  She continues anastrozole.  She will on  anastrozole through October 2025.  She received an influenza vaccine today.  [name] will return for an office and lab visit in early December.  Thornton Papas, MD  06/17/2023  11:08 AM

## 2023-06-18 LAB — T4: T4, Total: 8.2 ug/dL (ref 4.5–12.0)

## 2023-08-04 DIAGNOSIS — N1832 Chronic kidney disease, stage 3b: Secondary | ICD-10-CM | POA: Diagnosis not present

## 2023-08-13 DIAGNOSIS — D473 Essential (hemorrhagic) thrombocythemia: Secondary | ICD-10-CM | POA: Diagnosis not present

## 2023-08-13 DIAGNOSIS — N1832 Chronic kidney disease, stage 3b: Secondary | ICD-10-CM | POA: Diagnosis not present

## 2023-08-13 DIAGNOSIS — D631 Anemia in chronic kidney disease: Secondary | ICD-10-CM | POA: Diagnosis not present

## 2023-08-13 DIAGNOSIS — I129 Hypertensive chronic kidney disease with stage 1 through stage 4 chronic kidney disease, or unspecified chronic kidney disease: Secondary | ICD-10-CM | POA: Diagnosis not present

## 2023-08-15 DIAGNOSIS — Z79899 Other long term (current) drug therapy: Secondary | ICD-10-CM | POA: Diagnosis not present

## 2023-08-15 DIAGNOSIS — H35363 Drusen (degenerative) of macula, bilateral: Secondary | ICD-10-CM | POA: Diagnosis not present

## 2023-08-15 DIAGNOSIS — H52223 Regular astigmatism, bilateral: Secondary | ICD-10-CM | POA: Diagnosis not present

## 2023-08-15 DIAGNOSIS — H26493 Other secondary cataract, bilateral: Secondary | ICD-10-CM | POA: Diagnosis not present

## 2023-08-15 DIAGNOSIS — H04123 Dry eye syndrome of bilateral lacrimal glands: Secondary | ICD-10-CM | POA: Diagnosis not present

## 2023-08-26 DIAGNOSIS — N1832 Chronic kidney disease, stage 3b: Secondary | ICD-10-CM | POA: Diagnosis not present

## 2023-09-09 ENCOUNTER — Inpatient Hospital Stay: Payer: Medicare Other | Attending: Oncology | Admitting: Oncology

## 2023-09-09 ENCOUNTER — Inpatient Hospital Stay: Payer: Medicare Other

## 2023-09-09 VITALS — BP 133/58 | HR 65 | Temp 98.2°F | Resp 18 | Ht 66.0 in | Wt 123.4 lb

## 2023-09-09 DIAGNOSIS — M25511 Pain in right shoulder: Secondary | ICD-10-CM | POA: Diagnosis not present

## 2023-09-09 DIAGNOSIS — D75839 Thrombocytosis, unspecified: Secondary | ICD-10-CM | POA: Diagnosis not present

## 2023-09-09 DIAGNOSIS — M5136 Other intervertebral disc degeneration, lumbar region with discogenic back pain only: Secondary | ICD-10-CM | POA: Diagnosis not present

## 2023-09-09 DIAGNOSIS — C50912 Malignant neoplasm of unspecified site of left female breast: Secondary | ICD-10-CM | POA: Diagnosis not present

## 2023-09-09 DIAGNOSIS — R768 Other specified abnormal immunological findings in serum: Secondary | ICD-10-CM | POA: Diagnosis not present

## 2023-09-09 DIAGNOSIS — D473 Essential (hemorrhagic) thrombocythemia: Secondary | ICD-10-CM | POA: Diagnosis not present

## 2023-09-09 DIAGNOSIS — Z79811 Long term (current) use of aromatase inhibitors: Secondary | ICD-10-CM | POA: Diagnosis not present

## 2023-09-09 DIAGNOSIS — Z170 Estrogen receptor positive status [ER+]: Secondary | ICD-10-CM | POA: Diagnosis not present

## 2023-09-09 DIAGNOSIS — R5381 Other malaise: Secondary | ICD-10-CM

## 2023-09-09 DIAGNOSIS — Z79899 Other long term (current) drug therapy: Secondary | ICD-10-CM | POA: Insufficient documentation

## 2023-09-09 DIAGNOSIS — M1991 Primary osteoarthritis, unspecified site: Secondary | ICD-10-CM | POA: Diagnosis not present

## 2023-09-09 DIAGNOSIS — N1832 Chronic kidney disease, stage 3b: Secondary | ICD-10-CM | POA: Diagnosis not present

## 2023-09-09 DIAGNOSIS — Z6820 Body mass index (BMI) 20.0-20.9, adult: Secondary | ICD-10-CM | POA: Diagnosis not present

## 2023-09-09 DIAGNOSIS — Z1501 Genetic susceptibility to malignant neoplasm of breast: Secondary | ICD-10-CM | POA: Diagnosis not present

## 2023-09-09 DIAGNOSIS — Z1721 Progesterone receptor positive status: Secondary | ICD-10-CM | POA: Diagnosis not present

## 2023-09-09 LAB — CBC WITH DIFFERENTIAL (CANCER CENTER ONLY)
Abs Immature Granulocytes: 0.08 K/uL — ABNORMAL HIGH (ref 0.00–0.07)
Basophils Absolute: 0.2 K/uL — ABNORMAL HIGH (ref 0.0–0.1)
Basophils Relative: 1 %
Eosinophils Absolute: 0.3 K/uL (ref 0.0–0.5)
Eosinophils Relative: 2 %
HCT: 35.8 % — ABNORMAL LOW (ref 36.0–46.0)
Hemoglobin: 11.8 g/dL — ABNORMAL LOW (ref 12.0–15.0)
Immature Granulocytes: 1 %
Lymphocytes Relative: 7 %
Lymphs Abs: 0.9 K/uL (ref 0.7–4.0)
MCH: 34 pg (ref 26.0–34.0)
MCHC: 33 g/dL (ref 30.0–36.0)
MCV: 103.2 fL — ABNORMAL HIGH (ref 80.0–100.0)
Monocytes Absolute: 0.6 K/uL (ref 0.1–1.0)
Monocytes Relative: 5 %
Neutro Abs: 10.9 K/uL — ABNORMAL HIGH (ref 1.7–7.7)
Neutrophils Relative %: 84 %
Platelet Count: 352 K/uL (ref 150–400)
RBC: 3.47 MIL/uL — ABNORMAL LOW (ref 3.87–5.11)
RDW: 14.2 % (ref 11.5–15.5)
WBC Count: 12.9 K/uL — ABNORMAL HIGH (ref 4.0–10.5)
nRBC: 0 % (ref 0.0–0.2)

## 2023-09-09 LAB — CMP (CANCER CENTER ONLY)
ALT: 47 U/L — ABNORMAL HIGH (ref 0–44)
AST: 27 U/L (ref 15–41)
Albumin: 3.6 g/dL (ref 3.5–5.0)
Alkaline Phosphatase: 129 U/L — ABNORMAL HIGH (ref 38–126)
Anion gap: 8 (ref 5–15)
BUN: 29 mg/dL — ABNORMAL HIGH (ref 8–23)
CO2: 26 mmol/L (ref 22–32)
Calcium: 8.8 mg/dL — ABNORMAL LOW (ref 8.9–10.3)
Chloride: 109 mmol/L (ref 98–111)
Creatinine: 1.26 mg/dL — ABNORMAL HIGH (ref 0.44–1.00)
GFR, Estimated: 42 mL/min — ABNORMAL LOW
Glucose, Bld: 112 mg/dL — ABNORMAL HIGH (ref 70–99)
Potassium: 4.2 mmol/L (ref 3.5–5.1)
Sodium: 143 mmol/L (ref 135–145)
Total Bilirubin: 0.6 mg/dL
Total Protein: 5.9 g/dL — ABNORMAL LOW (ref 6.5–8.1)

## 2023-09-09 LAB — TSH: TSH: 6.214 u[IU]/mL — ABNORMAL HIGH (ref 0.350–4.500)

## 2023-09-09 NOTE — Progress Notes (Signed)
[name]   Diagnosis: Thrombocytosis, breast cancer  INTERVAL HISTORY:   [name] returns as scheduled.  She continues hydroxyurea.  No bleeding or symptom of thrombosis.  No nausea or rash.  She reports chronic back pain that is increased over the past few weeks.  She has seen rheumatology for the back pain.  Objective:  Vital signs in last 24 hours:  Blood pressure (!) 133/58, pulse 65, temperature 98.2 F (36.8 C), temperature source Temporal, resp. rate 18, height 5\' 6"  (1.676 m), weight 123 lb 6.4 oz (56 kg), last menstrual period 10/07/1974, SpO2 96%.    HEENT: No thrush or ulcers, 3-4 mm raised lesion at the right side of the palate (she reports this has been present ( Resp: Lungs clear bilaterally Cardio: Regular rate and rhythm with premature beats GI: No hepatosplenomegaly Vascular: No leg edema   Lab Results:  Lab Results  Component Value Date   WBC 12.9 (H) 09/09/2023   HGB 11.8 (L) 09/09/2023   HCT 35.8 (L) 09/09/2023   MCV 103.2 (H) 09/09/2023   PLT 352 09/09/2023   NEUTROABS 10.9 (H) 09/09/2023    CMP  Lab Results  Component Value Date   NA 143 09/09/2023   K 4.2 09/09/2023   CL 109 09/09/2023   CO2 26 09/09/2023   GLUCOSE 112 (H) 09/09/2023   BUN 29 (H) 09/09/2023   CREATININE 1.26 (H) 09/09/2023   CALCIUM 8.8 (L) 09/09/2023   PROT 5.9 (L) 09/09/2023   ALBUMIN 3.6 09/09/2023   AST 27 09/09/2023   ALT 47 (H) 09/09/2023   ALKPHOS 129 (H) 09/09/2023   BILITOT 0.6 09/09/2023   GFRNONAA 42 (L) 09/09/2023   GFRAA 44 (L) 06/07/2019    Lab Results  Component Value Date   CA125 12 07/15/2016    Lab Results  Component Value Date   INR 1.2 02/22/2008   LABPROT 15.0 02/22/2008    Imaging:  No results found.  Medications: I have reviewed the patient's current medications.   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  Status post a needle localized lumpectomy and sentinel lymph node biopsy 04/13/2009 with the pathology confirming high-grade ductal carcinoma in situ with an associated 0.26 mm invasive carcinoma, ER positive, PR positive, and HER2 positive. Status post adjuvant right breast radiation completed 06/19/2009. Initiation of Arimidex 05/01/2009.  Completed 5 years in July 2015 Family history of breast cancer. History of Clostridium difficile colitis. Frequent bowel movements following the bowel resection in 2009. 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 Sedam appears to be tolerating the hydroxyurea well.  The platelet count is in goal range.  She will continue hydroxyurea at  the current dose.  She continues anastrozole for breast cancer.  She will return for an office and lab visit in 3 months.  Thornton Papas, MD  09/09/2023  10:56 AM

## 2023-09-10 LAB — T4: T4, Total: 8.4 ug/dL (ref 4.5–12.0)

## 2023-09-16 DIAGNOSIS — M549 Dorsalgia, unspecified: Secondary | ICD-10-CM | POA: Diagnosis not present

## 2023-09-16 DIAGNOSIS — N1832 Chronic kidney disease, stage 3b: Secondary | ICD-10-CM | POA: Diagnosis not present

## 2023-09-16 DIAGNOSIS — M4014 Other secondary kyphosis, thoracic region: Secondary | ICD-10-CM | POA: Diagnosis not present

## 2023-09-16 DIAGNOSIS — M419 Scoliosis, unspecified: Secondary | ICD-10-CM | POA: Diagnosis not present

## 2023-09-16 DIAGNOSIS — I10 Essential (primary) hypertension: Secondary | ICD-10-CM | POA: Diagnosis not present

## 2023-09-16 DIAGNOSIS — R030 Elevated blood-pressure reading, without diagnosis of hypertension: Secondary | ICD-10-CM | POA: Diagnosis not present

## 2023-09-17 ENCOUNTER — Other Ambulatory Visit: Payer: Self-pay | Admitting: Internal Medicine

## 2023-09-17 DIAGNOSIS — M549 Dorsalgia, unspecified: Secondary | ICD-10-CM

## 2023-09-22 DIAGNOSIS — N1832 Chronic kidney disease, stage 3b: Secondary | ICD-10-CM | POA: Diagnosis not present

## 2023-09-30 ENCOUNTER — Other Ambulatory Visit: Payer: Self-pay

## 2023-09-30 ENCOUNTER — Encounter: Payer: Self-pay | Admitting: Physical Therapy

## 2023-09-30 ENCOUNTER — Ambulatory Visit: Payer: Medicare Other | Attending: Internal Medicine | Admitting: Physical Therapy

## 2023-09-30 DIAGNOSIS — M6281 Muscle weakness (generalized): Secondary | ICD-10-CM

## 2023-09-30 DIAGNOSIS — R293 Abnormal posture: Secondary | ICD-10-CM

## 2023-09-30 DIAGNOSIS — C50312 Malignant neoplasm of lower-inner quadrant of left female breast: Secondary | ICD-10-CM | POA: Diagnosis not present

## 2023-09-30 DIAGNOSIS — M62830 Muscle spasm of back: Secondary | ICD-10-CM | POA: Diagnosis not present

## 2023-09-30 DIAGNOSIS — M170 Bilateral primary osteoarthritis of knee: Secondary | ICD-10-CM | POA: Diagnosis not present

## 2023-09-30 DIAGNOSIS — M546 Pain in thoracic spine: Secondary | ICD-10-CM | POA: Diagnosis not present

## 2023-09-30 DIAGNOSIS — M5459 Other low back pain: Secondary | ICD-10-CM

## 2023-09-30 DIAGNOSIS — I10 Essential (primary) hypertension: Secondary | ICD-10-CM | POA: Diagnosis not present

## 2023-09-30 DIAGNOSIS — F5101 Primary insomnia: Secondary | ICD-10-CM | POA: Diagnosis not present

## 2023-09-30 DIAGNOSIS — N1832 Chronic kidney disease, stage 3b: Secondary | ICD-10-CM | POA: Diagnosis not present

## 2023-09-30 NOTE — Therapy (Signed)
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   Breast cancer (HCC) 2010   RIGHT   CAD (coronary artery disease)    Dysuria    Family history of breast cancer    GERD (gastroesophageal reflux disease)    HX OF   Heart murmur    History of blood transfusion    History of breast cancer ONCOLOGIST-- DR Truett Perna   DX 2010--  RIGHT BREAST DCIS  HIGH GRADE S/P LUMPECTOMY W/ SLN BX--  NO RECURRENCE   History of kidney stones    History of ovarian cancer    2009--  S/P COLON RESECTION FOR PAPILLARY SEROUS CARCINOMA, BORDERLINE OVARIAN CANCER VERSUS PRIMARY PERITONEAL CARCINOMA WITH PSAMMOMA BODIES--  NO RECURRENCE   History of skin cancer    excision basal cell   Hyperlipidemia    Hypertension    Kidney stones 7/14   Nocturia    Ovarian carcinoma (HCC)    papillary serous-low grade, recurrence found in fat necrosis during ileostomy   Renal stones 2014   Shingles 30 YRS AGO   Ureteral calculi    bilateral   Past Surgical History:  Procedure Laterality Date   APPENDECTOMY     BREAST LUMPECTOMY Right 04-13-2009   W/ SLN BX   BREAST LUMPECTOMY WITH RADIOACTIVE SEED LOCALIZATION Left 06/11/2019   Procedure: LEFT BREAST LUMPECTOMY WITH RADIOACTIVE SEED LOCALIZATION;  Surgeon: Griselda Miner, MD;  Location: [facility];  Service: General;  Laterality: Left;   CARDIAC CATHETERIZATION  11-11-2005 DR Verdis Prime   MODERATE LAD DISEASE/ NORMAL LVF/ EF 65-75%   CATARACT  EXTRACTION W/ INTRAOCULAR LENS IMPLANT Bilateral 04/26/2013   CHOLECYSTECTOMY  1990   OPEN   COLONOSCOPY     CYSTOSCOPY W/ URETERAL STENT PLACEMENT Bilateral 05/07/2013   Procedure: CYSTOSCOPY WITH STENT REPLACEMENTS;  Surgeon: Sebastian Ache, MD;  Location: [facility];  Service: Urology;  Laterality: Bilateral;   CYSTOSCOPY WITH BIOPSY N/A 04/07/2013   Procedure: CYSTOSCOPY WITH BIOPSY and fulgeration;  Surgeon: Sebastian Ache, MD;  Location: [facility];  Service: Urology;  Laterality: N/A;   CYSTOSCOPY WITH RETROGRADE PYELOGRAM, URETEROSCOPY AND STENT PLACEMENT Bilateral 05/07/2013   Procedure: CYSTOSCOPY WITH RETROGRADE PYELOGRAM, URETEROSCOPY ;  Surgeon: Sebastian Ache, MD;  Location: [facility];  Service: Urology;  Laterality: Bilateral;   CYSTOSCOPY WITH RETROGRADE PYELOGRAM, URETEROSCOPY AND STENT PLACEMENT Bilateral 01/06/2019   Procedure: CYSTOSCOPY WITH RETROGRADE PYELOGRAM, URETEROSCOPY AND STENT PLACEMENT, FIRST STAGE;  Surgeon: Sebastian Ache, MD;  Location: [facility];  Service: Urology;  Laterality: Bilateral;   CYSTOSCOPY WITH RETROGRADE PYELOGRAM, URETEROSCOPY AND STENT PLACEMENT Bilateral 01/27/2019   Procedure: CYSTOSCOPY WITH RETROGRADE PYELOGRAM, URETEROSCOPY AND STENT PLACEMENT;  Surgeon: Sebastian Ache, MD;  Location: [facility];  Service: Urology;  Laterality: Bilateral;  75 MINS   CYSTOSCOPY WITH  STENT PLACEMENT Bilateral 04/07/2013   Procedure: CYSTOSCOPY WITH STENT PLACEMENT;  Surgeon: Sebastian Ache, MD;  Location: [facility];  Service: Urology;  Laterality: Bilateral;   DX LAPAROSCOPY W/ PERITONEAL AND OMENTAL BX'S AND WASHINGS  02-16-2008   EXCISION RIGHT BREAST MASS  02-10-2002   EXP. LAP. EXTENSIVE ADHESIOLYSIS/ RESECTION TERMINAL ILEUM , ASCENDING AND DESCENDING COLON WITH CREATION ILEOSTOMY AND MUCOUS FISTULA  02-19-2008   PERFERATION AND ABD. CANCER--  TAKEDOWN ILEOSTOMY 08-23-2008   HOLMIUM LASER APPLICATION Bilateral 01/06/2019   Procedure: HOLMIUM LASER  APPLICATION;  Surgeon: Sebastian Ache, MD;  Location: [facility];  Service: Urology;  Laterality: Bilateral;   HOLMIUM LASER APPLICATION Bilateral 01/27/2019   Procedure: HOLMIUM LASER APPLICATION;  Surgeon: Sebastian Ache, MD;  Location: [facility];  Service: Urology;  Laterality: Bilateral;   I & D EXTREMITY Right 02/02/2022   Procedure: IRRIGATION AND DEBRIDEMENT HAND;  Surgeon: Bradly Bienenstock, MD;  Location: [facility];  Service: Orthopedics;  Laterality: Right;   ILEOSTOMY CLOSURE  08/2008   TOTAL ABDOMINAL HYSTERECTOMY W/ BILATERAL SALPINGOOPHORECTOMY  1976   TOTAL ABDOMINAL HYSTERECTOMY W/ BILATERAL SALPINGOOPHORECTOMY  1976   VULVAR LESION REMOVAL N/A 08/22/2014   Procedure: EXCISION OF VULVAR CYST  ;  Surgeon: Annamaria Boots, MD;  Location: [facility];  Service: Gynecology;  Laterality: N/A;   WOUND DEBRIDEMENT  05/28/2012   Procedure: DEBRIDEMENT ABDOMINAL WOUND;  Surgeon: Currie Paris, MD;  Location: [facility];  Service: General;  Laterality: N/A;  excision chronic wound abdominal wall   Patient Active Problem List   Diagnosis Date Noted   Infection of hand 02/02/2022   Genetic testing 07/20/2019   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/20/2019   Family history of breast cancer    Carcinoma of upper-outer quadrant of left breast in female, estrogen receptor positive (HCC) 05/19/2019   Left carotid bruit 01/21/2017   Hypertension    Hyperlipidemia    GERD (gastroesophageal reflux disease)    Ureteral calculi    History of breast cancer    History of ovarian cancer    History of skin cancer    Frequency of urination    Dysuria    Kidney stones    CAD (coronary artery disease)    Ductal carcinoma (HCC) 08/11/2013   Serous tumor, 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    PCP: Lorenda Ishihara, MD  REFERRING PROVIDER: Donnetta Hail, MD  REFERRING DIAG: LBP, DDD  Rationale for Evaluation and Treatment: Rehabilitation  THERAPY DIAG:   Pain in thoracic spine  Abnormal posture  Other low back pain  Muscle weakness (generalized)  ONSET DATE: ~1 year with insidious onset  SUBJECTIVE:  SUBJECTIVE STATEMENT: "It's bothered me for ~1 year but nothing I couldn't live with. And then it settled in my upper right shoulder and stayed there for a couple of days and then moved to my left low back." Pt states it moves around but it has mostly been in her left back for the last month. Feels it most with walking and standing. Feels she can only perform 10 minutes of walking and standing max. Prednisone didn't help. Did get order for muscle relaxer but has not gotten it yet. Has tried some salon pas and heat but didn't feel much relief.   PERTINENT HISTORY:  No history of injury, Thrombocytosis, breast cancer  PAIN:  Are you having pain? Yes: NPRS scale: 8 or 9/10 currently; 0 if sitting or laying/10; at worst 10/10 Pain location: lower left back Pain description: sometimes dull, sometimes "fire" Aggravating factors: prolonged standing and walking Relieving factors: tramadol takes the edge, sitting in specific position  PRECAUTIONS: no e-stim or ultrasound due to active breast cancer  RED FLAGS: None   WEIGHT BEARING RESTRICTIONS: No  FALLS:  Has patient fallen in last 6 months? No  LIVING ENVIRONMENT: Lives with: lives alone Lives in: House/apartment Stairs: Yes: Internal: 14 steps; can reach both Has following equipment at home: None  OCCUPATION: Retired. Likes to work in yard in good weather, shopping. Primarily sits to watch TV  PLOF: Independent  PATIENT GOALS: Improve pain to increase standing and walking tolerance  NEXT MD VISIT: n/a  OBJECTIVE:  Note: Objective measures were completed at Evaluation unless otherwise  noted.  DIAGNOSTIC FINDINGS:  N/a; MRI thoracic spine scheduled 10/09/23  PATIENT SURVEYS:  Modified Oswestry Low Back Pain Disability Questionnaire: 27 / 50 = 54.0 %  COGNITION: Overall cognitive status: Within functional limits for tasks assessed     SENSATION: WFL  MUSCLE LENGTH: Hamstrings: Right 90 deg; Left 90 deg Thomas test: equal L&R  POSTURE: rounded shoulders and increased thoracic kyphosis  PALPATION: TTP upper L lumbar paraspinals and lower thoracic paraspinals Hypomobile thoracic vertebrae  LUMBAR ROM:   AROM eval  Flexion 90% pain on coming up  Extension 10%  Right lateral flexion To knee with some pain  Left lateral flexion To knee with some pain  Right rotation 80%  Left rotation 80%   (Blank rows = not tested)  LOWER EXTREMITY ROM:     Active  Right eval Left eval  Hip flexion    Hip extension    Hip abduction    Hip adduction    Hip internal rotation    Hip external rotation    Knee flexion    Knee extension    Ankle dorsiflexion    Ankle plantarflexion    Ankle inversion    Ankle eversion     (Blank rows = not tested)  LOWER EXTREMITY MMT:    MMT Right eval Left eval  Hip flexion 4+ 4+  Hip extension 3+ 3  Hip abduction 4 3+  Hip adduction    Hip internal rotation    Hip external rotation    Knee flexion 5 5  Knee extension 5 5  Ankle dorsiflexion    Ankle plantarflexion    Ankle inversion    Ankle eversion     (Blank rows = not tested)  LUMBAR SPECIAL TESTS:  Straight leg raise test: Negative, FABER test: Negative, and Thomas test: Negative  FUNCTIONAL TESTS:  Double leg lowering test at ~60 deg Sit<>Stand requires use of UEs  GAIT: Distance  walked: Into clinic Assistive device utilized: None Level of assistance: Complete Independence Comments: WNL  TREATMENT DATE:  See HEP below                                                                                                                                 PATIENT EDUCATION:  Education details: Exam findings, POC, initial HEP Person educated: Patient Education method: Explanation, Demonstration, and Handouts Education comprehension: verbalized understanding, returned demonstration, and needs further education  HOME EXERCISE PROGRAM: Access Code: [email or web address] URL: [email or web address] Date: 09/30/2023 Prepared by: Vernon Prey April Kirstie Peri  Exercises - Supine Lower Trunk Rotation  - 1 x daily - 7 x weekly - 1 sets - 5 reps - 10 sec hold - Sidelying Thoracic Rotation with Open Book  - 1 x daily - 7 x weekly - 1 sets - 5 reps - 10 sec hold - Seated Thoracic Lumbar Extension with Pectoralis Stretch  - 1 x daily - 7 x weekly - 1 sets - 10 reps - Seated Diaphragmatic Breathing  - 1 x daily - 7 x weekly - 1 sets - 10 reps - Standing Scapular Retraction  - 1 x daily - 7 x weekly - 2 sets - 10 reps - Standing Paraspinals Mobilization with Small Ball on Wall  - 1 x daily - 7 x weekly - 1 sets - 10 reps  ASSESSMENT:  CLINICAL IMPRESSION: Patient is an 89 y.o. F who was seen today for physical therapy evaluation and treatment for back pain. Lower lumbar testing did not recreate pt's pain -- her pain provoked primarily with thoracic movement and reported difficulty breathing with thoracic extension mobilization. Assessment significant for hypomobile thoracic spine, difficulty with diaphragmatic breathing, weak core/trunk, weak hip and thoracic/lumbar extensors, and postural abnormalities. Pt has trigger points in L lower thoracic/upper lumbar. All these issues are limiting pt's tolerance to prolonged standing and walking. Pt notes pain is completely eliminated in certain sitting positions and when sleeping. Pt will greatly benefit from PT to address these deficits to improve her overall function.  OBJECTIVE IMPAIRMENTS: decreased activity tolerance, decreased endurance, decreased mobility, decreased ROM, decreased strength, hypomobility,  increased fascial restrictions, increased muscle spasms, improper body mechanics, postural dysfunction, and pain.   ACTIVITY LIMITATIONS: lifting, bending, standing, transfers, bed mobility, and locomotion level  PARTICIPATION LIMITATIONS: meal prep, cleaning, laundry, shopping, community activity, and yard work  PERSONAL FACTORS: Age, Fitness, Past/current experiences, and Time since onset of injury/illness/exacerbation are also affecting patient's functional outcome.   REHAB POTENTIAL: Good  CLINICAL DECISION MAKING: Evolving/moderate complexity  EVALUATION COMPLEXITY: Moderate   GOALS: Goals reviewed with patient? Yes  SHORT TERM GOALS: Target date: 10/28/2023   Pt will be ind with initial HEP Baseline: Goal status: INITIAL  2.  Pt will be able to tolerate at least 15 min of standing and walking with pain </=5/10 Baseline:  Goal status: INITIAL  3.  Pt will be able to perform  diaphragmatic breathing without pulling in her back Baseline:  Goal status: INITIAL   LONG TERM GOALS: Target date: 11/25/2023   Pt will be ind with management and progression of HEP Baseline:  Goal status: INITIAL  2.  Pt will be able to tolerate at least 30 min of standing and walking for home and community tasks Baseline:  Goal status: INITIAL  3.  Pt will be able to demo at least 4/5 hip, midback, and trunk extensor strength for improved postural stability Baseline:  Goal status: INITIAL  4.  Pt will be able to perform 5x STS without UE assist in </=15 sec to demo decreased fall risk Baseline:  Goal status: INITIAL  5.  Pt will have improved modified Oswestry to </=44% to demo MCID Baseline:  Goal status: INITIAL   PLAN:  PT FREQUENCY: 2x/week  PT DURATION: 8 weeks  PLANNED INTERVENTIONS: 97164- PT Re-evaluation, 97110-Therapeutic exercises, 97530- Therapeutic activity, 97112- Neuromuscular re-education, 97535- Self Care, 44010- Manual therapy, [phone]- Gait training, [phone]-  Aquatic Therapy, Patient/Family education, Balance training, Taping, Dry Needling, Joint mobilization, Spinal mobilization, Cryotherapy, and Moist heat.  PLAN FOR NEXT SESSION: Assess response to HEP. Gentle manual if indicated. Stretch/mobilize thoracic spine as able. Strengthen hip and trunk extensors. Work on core, diaphragmatic breathing and postural strengthening.    Kadyn Guild April Ma L Kahmari Koller, PT 09/30/2023, 2:52 PM

## 2023-10-03 ENCOUNTER — Other Ambulatory Visit: Payer: Self-pay

## 2023-10-03 ENCOUNTER — Ambulatory Visit: Payer: Medicare Other

## 2023-10-03 DIAGNOSIS — R293 Abnormal posture: Secondary | ICD-10-CM

## 2023-10-03 DIAGNOSIS — H00025 Hordeolum internum left lower eyelid: Secondary | ICD-10-CM | POA: Diagnosis not present

## 2023-10-03 DIAGNOSIS — M546 Pain in thoracic spine: Secondary | ICD-10-CM | POA: Diagnosis not present

## 2023-10-03 DIAGNOSIS — M6281 Muscle weakness (generalized): Secondary | ICD-10-CM | POA: Diagnosis not present

## 2023-10-03 DIAGNOSIS — M5459 Other low back pain: Secondary | ICD-10-CM

## 2023-10-03 NOTE — Therapy (Signed)
OUTPATIENT PHYSICAL THERAPY THORACOLUMBAR TREATMENT   Patient Name: [name] MRN: [id number] DOB:Apr 14, 1934, 89 y.o., female Today's Date: 10/03/2023  END OF SESSION:  PT End of Session - 10/03/23 1002     Visit Number 2    Date for PT Re-Evaluation 11/25/23    Authorization Type Medicare    Progress Note Due on Visit 10    PT Start Time 1015    PT Stop Time 1100    PT Time Calculation (min) 45 min    Activity Tolerance Patient tolerated treatment well    Behavior During Therapy WFL for tasks assessed/performed              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   Breast cancer (HCC) 2010   RIGHT   CAD (coronary artery disease)    Dysuria    Family history of breast cancer    GERD (gastroesophageal reflux disease)    HX OF   Heart murmur    History of blood transfusion    History of breast cancer ONCOLOGIST-- DR Truett Perna   DX 2010--  RIGHT BREAST DCIS  HIGH GRADE S/P LUMPECTOMY W/ SLN BX--  NO RECURRENCE   History of kidney stones    History of ovarian cancer    2009--  S/P COLON RESECTION FOR PAPILLARY SEROUS CARCINOMA, BORDERLINE OVARIAN CANCER VERSUS PRIMARY PERITONEAL CARCINOMA WITH PSAMMOMA BODIES--  NO RECURRENCE   History of skin cancer    excision basal cell   Hyperlipidemia    Hypertension    Kidney stones 7/14   Nocturia    Ovarian carcinoma (HCC)    papillary serous-low grade, recurrence found in fat necrosis during ileostomy   Renal stones 2014   Shingles 30 YRS AGO   Ureteral calculi    bilateral   Past Surgical History:  Procedure Laterality Date   APPENDECTOMY     BREAST LUMPECTOMY Right 04-13-2009   W/ SLN BX   BREAST LUMPECTOMY WITH RADIOACTIVE SEED LOCALIZATION Left 06/11/2019   Procedure: LEFT BREAST LUMPECTOMY WITH RADIOACTIVE SEED LOCALIZATION;  Surgeon: Griselda Miner, MD;  Location: [facility];  Service: General;  Laterality: Left;   CARDIAC  CATHETERIZATION  11-11-2005 DR Verdis Prime   MODERATE LAD DISEASE/ NORMAL LVF/ EF 65-75%   CATARACT EXTRACTION W/ INTRAOCULAR LENS IMPLANT Bilateral 04/26/2013   CHOLECYSTECTOMY  1990   OPEN   COLONOSCOPY     CYSTOSCOPY W/ URETERAL STENT PLACEMENT Bilateral 05/07/2013   Procedure: CYSTOSCOPY WITH STENT REPLACEMENTS;  Surgeon: Sebastian Ache, MD;  Location: [facility];  Service: Urology;  Laterality: Bilateral;   CYSTOSCOPY WITH BIOPSY N/A 04/07/2013   Procedure: CYSTOSCOPY WITH BIOPSY and fulgeration;  Surgeon: Sebastian Ache, MD;  Location: [facility];  Service: Urology;  Laterality: N/A;   CYSTOSCOPY WITH RETROGRADE PYELOGRAM, URETEROSCOPY AND STENT PLACEMENT Bilateral 05/07/2013   Procedure: CYSTOSCOPY WITH RETROGRADE PYELOGRAM, URETEROSCOPY ;  Surgeon: Sebastian Ache, MD;  Location: [facility];  Service: Urology;  Laterality: Bilateral;   CYSTOSCOPY WITH RETROGRADE PYELOGRAM, URETEROSCOPY AND STENT PLACEMENT Bilateral 01/06/2019   Procedure: CYSTOSCOPY WITH RETROGRADE PYELOGRAM, URETEROSCOPY AND STENT PLACEMENT, FIRST STAGE;  Surgeon: Sebastian Ache, MD;  Location: [facility];  Service: Urology;  Laterality: Bilateral;   CYSTOSCOPY WITH RETROGRADE PYELOGRAM, URETEROSCOPY AND STENT PLACEMENT Bilateral 01/27/2019   Procedure: CYSTOSCOPY WITH RETROGRADE PYELOGRAM, URETEROSCOPY AND STENT PLACEMENT;  Surgeon: Sebastian Ache, MD;  Location: [facility];  Service: Urology;  Laterality: Bilateral;  75 MINS   CYSTOSCOPY WITH STENT PLACEMENT Bilateral 04/07/2013   Procedure: CYSTOSCOPY WITH STENT PLACEMENT;  Surgeon: Sebastian Ache, MD;  Location: [facility];  Service: Urology;  Laterality: Bilateral;   DX LAPAROSCOPY W/ PERITONEAL AND OMENTAL BX'S AND WASHINGS  02-16-2008   EXCISION RIGHT BREAST MASS  02-10-2002   EXP. LAP. EXTENSIVE ADHESIOLYSIS/ RESECTION TERMINAL ILEUM , ASCENDING AND DESCENDING COLON WITH CREATION ILEOSTOMY AND MUCOUS FISTULA  02-19-2008   PERFERATION AND ABD. CANCER--   TAKEDOWN ILEOSTOMY 08-23-2008   HOLMIUM LASER APPLICATION Bilateral 01/06/2019   Procedure: HOLMIUM LASER APPLICATION;  Surgeon: Sebastian Ache, MD;  Location: [facility];  Service: Urology;  Laterality: Bilateral;   HOLMIUM LASER APPLICATION Bilateral 01/27/2019   Procedure: HOLMIUM LASER APPLICATION;  Surgeon: Sebastian Ache, MD;  Location: [facility];  Service: Urology;  Laterality: Bilateral;   I & D EXTREMITY Right 02/02/2022   Procedure: IRRIGATION AND DEBRIDEMENT HAND;  Surgeon: Bradly Bienenstock, MD;  Location: [facility] [place] [facility];  Service: Orthopedics;  Laterality: Right;   ILEOSTOMY CLOSURE  08/2008   TOTAL ABDOMINAL HYSTERECTOMY W/ BILATERAL SALPINGOOPHORECTOMY  1976   TOTAL ABDOMINAL HYSTERECTOMY W/ BILATERAL SALPINGOOPHORECTOMY  1976   VULVAR LESION REMOVAL N/A 08/22/2014   Procedure: EXCISION OF VULVAR CYST  ;  Surgeon: Annamaria Boots, MD;  Location: [facility];  Service: Gynecology;  Laterality: N/A;   WOUND DEBRIDEMENT  05/28/2012   Procedure: DEBRIDEMENT ABDOMINAL WOUND;  Surgeon: Currie Paris, MD;  Location: [facility];  Service: General;  Laterality: N/A;  excision chronic wound abdominal wall   Patient Active Problem List   Diagnosis Date Noted   Infection of hand 02/02/2022   Genetic testing 07/20/2019   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/20/2019   Family history of breast cancer    Carcinoma of upper-outer quadrant of left breast in female, estrogen receptor positive (HCC) 05/19/2019   Left carotid bruit 01/21/2017   Hypertension    Hyperlipidemia    GERD (gastroesophageal reflux disease)    Ureteral calculi    History of breast cancer    History of ovarian cancer    History of skin cancer    Frequency of urination    Dysuria    Kidney stones    CAD (coronary artery disease)    Ductal carcinoma (HCC) 08/11/2013   Serous tumor, 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    PCP: Lorenda Ishihara, MD  REFERRING PROVIDER: Donnetta Hail,  MD  REFERRING DIAG: LBP, DDD  Rationale for Evaluation and Treatment: Rehabilitation  THERAPY DIAG:  Pain in thoracic spine  Abnormal posture  Other low back pain  Muscle weakness (generalized)  ONSET DATE: ~1 year with insidious onset  SUBJECTIVE:  SUBJECTIVE STATEMENT: 10/03/23: he exercises didn't seem to make much difference in my Sx.  I am hurting.  EVAL:"It's bothered me for ~1 year but nothing I couldn't live with. And then it settled in my upper right shoulder and stayed there for a couple of days and then moved to my left low back." Pt states it moves around but it has mostly been in her left back for the last month. Feels it most with walking and standing. Feels she can only perform 10 minutes of walking and standing max. Prednisone didn't help. Did get order for muscle relaxer but has not gotten it yet. Has tried some salon pas and heat but didn't feel much relief.   PERTINENT HISTORY:  No history of injury, Thrombocytosis, breast cancer  PAIN:  Are you having pain? Yes: NPRS scale: 8 or 9/10 currently; 0 if sitting or laying/10; at worst 10/10 Pain location: lower left back Pain description: sometimes dull, sometimes "fire" Aggravating factors: prolonged standing and walking Relieving factors: tramadol takes the edge, sitting in specific position  PRECAUTIONS: no e-stim or ultrasound due to active breast cancer  RED FLAGS: None   WEIGHT BEARING RESTRICTIONS: No  FALLS:  Has patient fallen in last 6 months? No  LIVING ENVIRONMENT: Lives with: lives alone Lives in: House/apartment Stairs: Yes: Internal: 14 steps; can reach both Has following equipment at home: None  OCCUPATION: Retired. Likes to work in yard in good weather, shopping. Primarily sits to watch TV  PLOF:  Independent  PATIENT GOALS: Improve pain to increase standing and walking tolerance  NEXT MD VISIT: n/a  OBJECTIVE:  Note: Objective measures were completed at Evaluation unless otherwise noted.  DIAGNOSTIC FINDINGS:  N/a; MRI thoracic spine scheduled 10/09/23  PATIENT SURVEYS:  Modified Oswestry Low Back Pain Disability Questionnaire: 27 / 50 = 54.0 %  COGNITION: Overall cognitive status: Within functional limits for tasks assessed     SENSATION: WFL  MUSCLE LENGTH: Hamstrings: Right 90 deg; Left 90 deg Thomas test: equal L&R  POSTURE: rounded shoulders and increased thoracic kyphosis  PALPATION: TTP upper L lumbar paraspinals and lower thoracic paraspinals Hypomobile thoracic vertebrae  LUMBAR ROM:   AROM eval  Flexion 90% pain on coming up  Extension 10%  Right lateral flexion To knee with some pain  Left lateral flexion To knee with some pain  Right rotation 80%  Left rotation 80%   (Blank rows = not tested)  LOWER EXTREMITY ROM:     Active  Right eval Left eval  Hip flexion    Hip extension    Hip abduction    Hip adduction    Hip internal rotation    Hip external rotation    Knee flexion    Knee extension    Ankle dorsiflexion    Ankle plantarflexion    Ankle inversion    Ankle eversion     (Blank rows = not tested)  LOWER EXTREMITY MMT:    MMT Right eval Left eval  Hip flexion 4+ 4+  Hip extension 3+ 3  Hip abduction 4 3+  Hip adduction    Hip internal rotation    Hip external rotation    Knee flexion 5 5  Knee extension 5 5  Ankle dorsiflexion    Ankle plantarflexion    Ankle inversion    Ankle eversion     (Blank rows = not tested)  LUMBAR SPECIAL TESTS:  Straight leg raise test: Negative, FABER test: Negative, and Thomas test: Negative  FUNCTIONAL  TESTS:  Double leg lowering test at ~60 deg Sit<>Stand requires use of UEs  GAIT: Distance walked: Into clinic Assistive device utilized: None Level of assistance: Complete  Independence Comments: WNL  TREATMENT DATE:  10/03/23: Nustep x 5 min , level3, Ue's and LE's for gentle rocking, Rom thoracic spine, LS spine and extremities Reviewed current home exercise program, focused Treatment more today on adaptations to home activities as well.   Attempted several extension based ex, seated thoracic spine ext, door frame stretch, and yellow t band B shoulder ER had increased pain and some SOB with these attempts, so changed approach: Recommended placing lead foot inside cabinet to disperse forces while doing dishes Recommended use shower chair, pat has one but not using Recommended LS corset, trialed with sheet pulled snugly around Lumbar region and pt with some relief Instructed in using cane as pivot for forward bending stretch with B hands on cane to elongate and open thoracic spine Instructed in seated hip abd/ER with theraband with black band, to provide multifidi firing to improve lower lumbar stability.  See HEP below                                                                                                                                PATIENT EDUCATION:  Education details: Exam findings, POC, initial HEP Person educated: Patient Education method: Explanation, Demonstration, and Handouts Education comprehension: verbalized understanding, returned demonstration, and needs further education  HOME EXERCISE PROGRAM: Access Code: [email or web address] URL: [email or web address] Date: 09/30/2023 Prepared by: Vernon Prey April Kirstie Peri  Exercises - Supine Lower Trunk Rotation  - 1 x daily - 7 x weekly - 1 sets - 5 reps - 10 sec hold - Sidelying Thoracic Rotation with Open Book  - 1 x daily - 7 x weekly - 1 sets - 5 reps - 10 sec hold - Seated Thoracic Lumbar Extension with Pectoralis Stretch  - 1 x daily - 7 x weekly - 1 sets - 10 reps - Seated Diaphragmatic Breathing  - 1 x daily - 7 x weekly - 1 sets - 10 reps - Standing Scapular Retraction  - 1 x  daily - 7 x weekly - 2 sets - 10 reps - Standing Paraspinals Mobilization with Small Ball on Wall  - 1 x daily - 7 x weekly - 1 sets - 10 reps  ASSESSMENT:  CLINICAL IMPRESSION: Patient is an 89 y.o. F who was seen today for physical therapy treatment for back pain.   Focused most of treatment on adaptations to positions at home and with ADL's also changed exercises.  At times SOB with any attempted thoracic extension.. Pt notes pain is completely eliminated in certain sitting positions and when sleeping. Pt will greatly benefit from PT to address these deficits to improve her overall function.  OBJECTIVE IMPAIRMENTS: decreased activity tolerance, decreased endurance, decreased mobility, decreased ROM, decreased strength, hypomobility, increased fascial restrictions, increased muscle spasms, improper body mechanics, postural  dysfunction, and pain.   ACTIVITY LIMITATIONS: lifting, bending, standing, transfers, bed mobility, and locomotion level  PARTICIPATION LIMITATIONS: meal prep, cleaning, laundry, shopping, community activity, and yard work  PERSONAL FACTORS: Age, Fitness, Past/current experiences, and Time since onset of injury/illness/exacerbation are also affecting patient's functional outcome.   REHAB POTENTIAL: Good  CLINICAL DECISION MAKING: Evolving/moderate complexity  EVALUATION COMPLEXITY: Moderate   GOALS: Goals reviewed with patient? Yes  SHORT TERM GOALS: Target date: 10/28/2023   Pt will be ind with initial HEP Baseline: Goal status: INITIAL  2.  Pt will be able to tolerate at least 15 min of standing and walking with pain </=5/10 Baseline:  Goal status: INITIAL  3.  Pt will be able to perform diaphragmatic breathing without pulling in her back Baseline:  Goal status: INITIAL   LONG TERM GOALS: Target date: 11/25/2023   Pt will be ind with management and progression of HEP Baseline:  Goal status: INITIAL  2.  Pt will be able to tolerate at least 30 min  of standing and walking for home and community tasks Baseline:  Goal status: INITIAL  3.  Pt will be able to demo at least 4/5 hip, midback, and trunk extensor strength for improved postural stability Baseline:  Goal status: INITIAL  4.  Pt will be able to perform 5x STS without UE assist in </=15 sec to demo decreased fall risk Baseline:  Goal status: INITIAL  5.  Pt will have improved modified Oswestry to </=44% to demo MCID Baseline:  Goal status: INITIAL   PLAN:  PT FREQUENCY: 2x/week  PT DURATION: 8 weeks  PLANNED INTERVENTIONS: 97164- PT Re-evaluation, 97110-Therapeutic exercises, 97530- Therapeutic activity, 97112- Neuromuscular re-education, 97535- Self Care, 42706- Manual therapy, [phone]- Gait training, [phone]- Aquatic Therapy, Patient/Family education, Balance training, Taping, Dry Needling, Joint mobilization, Spinal mobilization, Cryotherapy, and Moist heat.  PLAN FOR NEXT SESSION: Assess response to HEP. Gentle manual if indicated. Stretch/mobilize thoracic spine as able. Strengthen hip and trunk extensors. Work on core, diaphragmatic breathing and postural strengthening.    Nathania Waldman L Tyshaun Vinzant, PT 10/03/2023, 10:23 AM

## 2023-10-06 ENCOUNTER — Other Ambulatory Visit: Payer: Self-pay

## 2023-10-06 ENCOUNTER — Ambulatory Visit: Payer: Medicare Other

## 2023-10-06 DIAGNOSIS — R293 Abnormal posture: Secondary | ICD-10-CM

## 2023-10-06 DIAGNOSIS — M546 Pain in thoracic spine: Secondary | ICD-10-CM | POA: Diagnosis not present

## 2023-10-06 DIAGNOSIS — M6281 Muscle weakness (generalized): Secondary | ICD-10-CM

## 2023-10-06 DIAGNOSIS — M5459 Other low back pain: Secondary | ICD-10-CM

## 2023-10-06 NOTE — Therapy (Signed)
OUTPATIENT PHYSICAL THERAPY THORACOLUMBAR TREATMENT   Patient Name: [name] MRN: [id number] DOB:06-22-1934, 89 y.o., female Today's Date: 10/06/2023  END OF SESSION:     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   Breast cancer (HCC) 2010   RIGHT   CAD (coronary artery disease)    Dysuria    Family history of breast cancer    GERD (gastroesophageal reflux disease)    HX OF   Heart murmur    History of blood transfusion    History of breast cancer ONCOLOGIST-- DR Truett Perna   DX 2010--  RIGHT BREAST DCIS  HIGH GRADE S/P LUMPECTOMY W/ SLN BX--  NO RECURRENCE   History of kidney stones    History of ovarian cancer    2009--  S/P COLON RESECTION FOR PAPILLARY SEROUS CARCINOMA, BORDERLINE OVARIAN CANCER VERSUS PRIMARY PERITONEAL CARCINOMA WITH PSAMMOMA BODIES--  NO RECURRENCE   History of skin cancer    excision basal cell   Hyperlipidemia    Hypertension    Kidney stones 7/14   Nocturia    Ovarian carcinoma (HCC)    papillary serous-low grade, recurrence found in fat necrosis during ileostomy   Renal stones 2014   Shingles 30 YRS AGO   Ureteral calculi    bilateral   Past Surgical History:  Procedure Laterality Date   APPENDECTOMY     BREAST LUMPECTOMY Right 04-13-2009   W/ SLN BX   BREAST LUMPECTOMY WITH RADIOACTIVE SEED LOCALIZATION Left 06/11/2019   Procedure: LEFT BREAST LUMPECTOMY WITH RADIOACTIVE SEED LOCALIZATION;  Surgeon: Griselda Miner, MD;  Location: [facility];  Service: General;  Laterality: Left;   CARDIAC CATHETERIZATION  11-11-2005 DR Verdis Prime   MODERATE LAD DISEASE/ NORMAL LVF/ EF 65-75%   CATARACT EXTRACTION W/ INTRAOCULAR LENS IMPLANT Bilateral 04/26/2013   CHOLECYSTECTOMY  1990   OPEN   COLONOSCOPY     CYSTOSCOPY W/ URETERAL STENT PLACEMENT Bilateral 05/07/2013   Procedure: CYSTOSCOPY WITH STENT REPLACEMENTS;  Surgeon: Sebastian Ache, MD;  Location: [facility];  Service: Urology;  Laterality: Bilateral;   CYSTOSCOPY WITH BIOPSY N/A 04/07/2013   Procedure: CYSTOSCOPY WITH BIOPSY and fulgeration;  Surgeon: Sebastian Ache, MD;  Location: [facility];  Service: Urology;  Laterality: N/A;   CYSTOSCOPY WITH RETROGRADE PYELOGRAM, URETEROSCOPY AND STENT PLACEMENT Bilateral 05/07/2013   Procedure: CYSTOSCOPY WITH RETROGRADE PYELOGRAM, URETEROSCOPY ;  Surgeon: Sebastian Ache, MD;  Location[facility];  Service: Urology;  Laterality: Bilateral;   CYSTOSCOPY WITH RETROGRADE PYELOGRAM, URETEROSCOPY AND STENT PLACEMENT Bilateral 01/06/2019   Procedure: CYSTOSCOPY WITH RETROGRADE PYELOGRAM, URETEROSCOPY AND STENT PLACEMENT, FIRST STAGE;  Surgeon: Sebastian Ache, MD;  Location: [facility];  Service: Urology;  Laterality: Bilateral;   CYSTOSCOPY WITH RETROGRADE PYELOGRAM, URETEROSCOPY AND STENT PLACEMENT Bilateral 01/27/2019   Procedure: CYSTOSCOPY WITH RETROGRADE PYELOGRAM, URETEROSCOPY AND STENT PLACEMENT;  Surgeon: Sebastian Ache, MD;  Location: [facility];  Service: Urology;  Laterality: Bilateral;  75 MINS   CYSTOSCOPY WITH STENT PLACEMENT Bilateral 04/07/2013   Procedure: CYSTOSCOPY WITH STENT PLACEMENT;  Surgeon: Sebastian Ache, MD;  Location: [facility];  Service: Urology;  Laterality: Bilateral;   DX LAPAROSCOPY W/ PERITONEAL AND OMENTAL BX'S AND WASHINGS  02-16-2008   EXCISION RIGHT BREAST MASS  02-10-2002   EXP. LAP. EXTENSIVE ADHESIOLYSIS/ RESECTION TERMINAL ILEUM , ASCENDING AND DESCENDING COLON WITH CREATION ILEOSTOMY AND MUCOUS FISTULA  02-19-2008   PERFERATION AND ABD. CANCER--  TAKEDOWN ILEOSTOMY 08-23-2008   HOLMIUM LASER APPLICATION Bilateral 01/06/2019   Procedure: HOLMIUM LASER APPLICATION;  Surgeon: Sebastian Ache, MD;  Location: [facility];  Service: Urology;  Laterality: Bilateral;   HOLMIUM LASER APPLICATION Bilateral 01/27/2019   Procedure: HOLMIUM LASER APPLICATION;  Surgeon: Sebastian Ache, MD;  Location: [facility];  Service: Urology;  Laterality:  Bilateral;   I & D EXTREMITY Right 02/02/2022   Procedure: IRRIGATION AND DEBRIDEMENT HAND;  Surgeon: Bradly Bienenstock, MD;  Location: [facility];  Service: Orthopedics;  Laterality: Right;   ILEOSTOMY CLOSURE  08/2008   TOTAL ABDOMINAL HYSTERECTOMY W/ BILATERAL SALPINGOOPHORECTOMY  1976   TOTAL ABDOMINAL HYSTERECTOMY W/ BILATERAL SALPINGOOPHORECTOMY  1976   VULVAR LESION REMOVAL N/A 08/22/2014   Procedure: EXCISION OF VULVAR CYST  ;  Surgeon: Annamaria Boots, MD;  Location: [facility];  Service: Gynecology;  Laterality: N/A;   WOUND DEBRIDEMENT  05/28/2012   Procedure: DEBRIDEMENT ABDOMINAL WOUND;  Surgeon: Currie Paris, MD;  Location: [facility];  Service: General;  Laterality: N/A;  excision chronic wound abdominal wall   Patient Active Problem List   Diagnosis Date Noted   Infection of hand 02/02/2022   Genetic testing 07/20/2019   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/20/2019   Family history of breast cancer    Carcinoma of upper-outer quadrant of left breast in female, estrogen receptor positive (HCC) 05/19/2019   Left carotid bruit 01/21/2017   Hypertension    Hyperlipidemia    GERD (gastroesophageal reflux disease)    Ureteral calculi    History of breast cancer    History of ovarian cancer    History of skin cancer    Frequency of urination    Dysuria    Kidney stones    CAD (coronary artery disease)    Ductal carcinoma (HCC) 08/11/2013   Serous tumor, 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    PCP: Lorenda Ishihara, MD  REFERRING PROVIDER: Donnetta Hail, MD  REFERRING DIAG: LBP, DDD  Rationale for Evaluation and Treatment: Rehabilitation  THERAPY DIAG:  No diagnosis found.  ONSET DATE: ~1 year with insidious onset  SUBJECTIVE:                                                                                                                                                                                           SUBJECTIVE  STATEMENT: 10/06/23: I am still hurting, not sleeping much, the only thing that has helped me so far has been sitting in a chair with arms and lifting my body weight with my upper arms a little.  EVAL:"It's bothered me for ~1 year  but nothing I couldn't live with. And then it settled in my upper right shoulder and stayed there for a couple of days and then moved to my left low back." Pt states it moves around but it has mostly been in her left back for the last month. Feels it most with walking and standing. Feels she can only perform 10 minutes of walking and standing max. Prednisone didn't help. Did get order for muscle relaxer but has not gotten it yet. Has tried some salon pas and heat but didn't feel much relief.   PERTINENT HISTORY:  No history of injury, Thrombocytosis, breast cancer  PAIN:  Are you having pain? Yes: NPRS scale: 8 or 9/10 currently; 0 if sitting or laying/10; at worst 10/10 Pain location: lower left back Pain description: sometimes dull, sometimes "fire" Aggravating factors: prolonged standing and walking Relieving factors: tramadol takes the edge, sitting in specific position  PRECAUTIONS: no e-stim or ultrasound due to active breast cancer  RED FLAGS: None   WEIGHT BEARING RESTRICTIONS: No  FALLS:  Has patient fallen in last 6 months? No  LIVING ENVIRONMENT: Lives with: lives alone Lives in: House/apartment Stairs: Yes: Internal: 14 steps; can reach both Has following equipment at home: None  OCCUPATION: Retired. Likes to work in yard in good weather, shopping. Primarily sits to watch TV  PLOF: Independent  PATIENT GOALS: Improve pain to increase standing and walking tolerance  NEXT MD VISIT: n/a  OBJECTIVE:  Note: Objective measures were completed at Evaluation unless otherwise noted.  DIAGNOSTIC FINDINGS:  N/a; MRI thoracic spine scheduled 10/09/23  PATIENT SURVEYS:  Modified Oswestry Low Back Pain Disability Questionnaire: 27 / 50 = 54.0  %  COGNITION: Overall cognitive status: Within functional limits for tasks assessed     SENSATION: WFL  MUSCLE LENGTH: Hamstrings: Right 90 deg; Left 90 deg Thomas test: equal L&R  POSTURE: rounded shoulders and increased thoracic kyphosis  PALPATION: TTP upper L lumbar paraspinals and lower thoracic paraspinals Hypomobile thoracic vertebrae  LUMBAR ROM:   AROM eval  Flexion 90% pain on coming up  Extension 10%  Right lateral flexion To knee with some pain  Left lateral flexion To knee with some pain  Right rotation 80%  Left rotation 80%   (Blank rows = not tested)  LOWER EXTREMITY ROM:     Active  Right eval Left eval  Hip flexion    Hip extension    Hip abduction    Hip adduction    Hip internal rotation    Hip external rotation    Knee flexion    Knee extension    Ankle dorsiflexion    Ankle plantarflexion    Ankle inversion    Ankle eversion     (Blank rows = not tested)  LOWER EXTREMITY MMT:    MMT Right eval Left eval  Hip flexion 4+ 4+  Hip extension 3+ 3  Hip abduction 4 3+  Hip adduction    Hip internal rotation    Hip external rotation    Knee flexion 5 5  Knee extension 5 5  Ankle dorsiflexion    Ankle plantarflexion    Ankle inversion    Ankle eversion     (Blank rows = not tested)  LUMBAR SPECIAL TESTS:  Straight leg raise test: Negative, FABER test: Negative, and Thomas test: Negative  FUNCTIONAL TESTS:  Double leg lowering test at ~60 deg Sit<>Stand requires use of UEs  GAIT: Distance walked: Into clinic Assistive device utilized: None Level  of assistance: Complete Independence Comments: WNL  TREATMENT DATE:  10/06/23:   10/03/23: Nustep x 5 min , level3, Ue's and LE's for gentle rocking, Rom thoracic spine, LS spine and extremities Reviewed current home exercise program, focused Treatment more today on adaptations to home activities as well.   Attempted several extension based ex, seated thoracic spine ext, door  frame stretch, and yellow t band B shoulder ER had increased pain and some SOB with these attempts, so changed approach: Recommended placing lead foot inside cabinet to disperse forces while doing dishes Recommended use shower chair, pat has one but not using Recommended LS corset, trialed with sheet pulled snugly around Lumbar region and pt with some relief Instructed in using cane as pivot for forward bending stretch with B hands on cane to elongate and open thoracic spine Instructed in seated hip abd/ER with theraband with black band, to provide multifidi firing to improve lower lumbar stability.  See HEP below                                                                                                                                PATIENT EDUCATION:  Education details: Exam findings, POC, initial HEP Person educated: Patient Education method: Explanation, Demonstration, and Handouts Education comprehension: verbalized understanding, returned demonstration, and needs further education  HOME EXERCISE PROGRAM: Access Code: [email or web address] URL: [email or web address] Date: 09/30/2023 Prepared by: Vernon Prey April Kirstie Peri  Exercises - Supine Lower Trunk Rotation  - 1 x daily - 7 x weekly - 1 sets - 5 reps - 10 sec hold - Sidelying Thoracic Rotation with Open Book  - 1 x daily - 7 x weekly - 1 sets - 5 reps - 10 sec hold - Seated Thoracic Lumbar Extension with Pectoralis Stretch  - 1 x daily - 7 x weekly - 1 sets - 10 reps - Seated Diaphragmatic Breathing  - 1 x daily - 7 x weekly - 1 sets - 10 reps - Standing Scapular Retraction  - 1 x daily - 7 x weekly - 2 sets - 10 reps - Standing Paraspinals Mobilization with Small Ball on Wall  - 1 x daily - 7 x weekly - 1 sets - 10 reps  ASSESSMENT:  CLINICAL IMPRESSION: Patient is an 89 y.o. F who participated in  physical therapy treatment for back pain.  She has obvious facial grimacing, slowed and guarded movements with  transitional movements, such as sit to stand.  The adaptations that we made last visit to her home activities, as well as therex have thus far not been very helpful.  Added electrical stimulation with interferential current today as a means for her to assess whether a TENs unit would  be beneficial to her at home.  She does have active cancer but as the estim is a precaution for this diagnosis, utilized the TENs in her lumbar region, away from her cancer which is breast. She did like the TENS  unit, helped with her pain.  She is to have MRI on Jan 2 and will see a spine orthhopedist on Jan 8, so we cancelled her visits until her orthopedist appt.  Will plan to see her Jan 13 back in this clinic.     OBJECTIVE IMPAIRMENTS: decreased activity tolerance, decreased endurance, decreased mobility, decreased ROM, decreased strength, hypomobility, increased fascial restrictions, increased muscle spasms, improper body mechanics, postural dysfunction, and pain.   ACTIVITY LIMITATIONS: lifting, bending, standing, transfers, bed mobility, and locomotion level  PARTICIPATION LIMITATIONS: meal prep, cleaning, laundry, shopping, community activity, and yard work  PERSONAL FACTORS: Age, Fitness, Past/current experiences, and Time since onset of injury/illness/exacerbation are also affecting patient's functional outcome.   REHAB POTENTIAL: Good  CLINICAL DECISION MAKING: Evolving/moderate complexity  EVALUATION COMPLEXITY: Moderate   GOALS: Goals reviewed with patient? Yes  SHORT TERM GOALS: Target date: 10/28/2023   Pt will be ind with initial HEP Baseline: Goal status: INITIAL  2.  Pt will be able to tolerate at least 15 min of standing and walking with pain </=5/10 Baseline:  Goal status: INITIAL  3.  Pt will be able to perform diaphragmatic breathing without pulling in her back Baseline:  Goal status: INITIAL   LONG TERM GOALS: Target date: 11/25/2023   Pt will be ind with management and  progression of HEP Baseline:  Goal status: INITIAL  2.  Pt will be able to tolerate at least 30 min of standing and walking for home and community tasks Baseline:  Goal status: INITIAL  3.  Pt will be able to demo at least 4/5 hip, midback, and trunk extensor strength for improved postural stability Baseline:  Goal status: INITIAL  4.  Pt will be able to perform 5x STS without UE assist in </=15 sec to demo decreased fall risk Baseline:  Goal status: INITIAL  5.  Pt will have improved modified Oswestry to </=44% to demo MCID Baseline:  Goal status: INITIAL   PLAN:  PT FREQUENCY: 2x/week  PT DURATION: 8 weeks  PLANNED INTERVENTIONS: 97164- PT Re-evaluation, 97110-Therapeutic exercises, 97530- Therapeutic activity, 97112- Neuromuscular re-education, 97535- Self Care, 82956- Manual therapy, [phone]- Gait training, [phone]- Aquatic Therapy, Patient/Family education, Balance training, Taping, Dry Needling, Joint mobilization, Spinal mobilization, Cryotherapy, and Moist heat.  PLAN FOR NEXT SESSION: Assess response to HEP. Gentle manual if indicated. Stretch/mobilize thoracic spine as able. Strengthen hip and trunk extensors. Work on core, diaphragmatic breathing and postural strengthening.    Alphia Behanna L Chan Rosasco, PT, DPT, OCS 10/06/2023, 4:00 PM

## 2023-10-09 ENCOUNTER — Ambulatory Visit
Admission: RE | Admit: 2023-10-09 | Discharge: 2023-10-09 | Disposition: A | Discharge status: Home / Self Care | Payer: Medicare Other | Source: Ambulatory Visit | Attending: Internal Medicine | Admitting: Internal Medicine

## 2023-10-09 ENCOUNTER — Ambulatory Visit: Payer: Medicare Other | Admitting: Physical Therapy

## 2023-10-09 DIAGNOSIS — R262 Difficulty in walking, not elsewhere classified: Secondary | ICD-10-CM | POA: Diagnosis not present

## 2023-10-09 DIAGNOSIS — M549 Dorsalgia, unspecified: Secondary | ICD-10-CM

## 2023-10-09 DIAGNOSIS — M546 Pain in thoracic spine: Secondary | ICD-10-CM | POA: Diagnosis not present

## 2023-10-13 ENCOUNTER — Ambulatory Visit: Payer: Medicare Other | Admitting: Physical Therapy

## 2023-10-14 DIAGNOSIS — I2510 Atherosclerotic heart disease of native coronary artery without angina pectoris: Secondary | ICD-10-CM | POA: Diagnosis not present

## 2023-10-14 DIAGNOSIS — M546 Pain in thoracic spine: Secondary | ICD-10-CM | POA: Diagnosis not present

## 2023-10-14 DIAGNOSIS — D649 Anemia, unspecified: Secondary | ICD-10-CM | POA: Diagnosis not present

## 2023-10-14 DIAGNOSIS — I10 Essential (primary) hypertension: Secondary | ICD-10-CM | POA: Diagnosis not present

## 2023-10-14 DIAGNOSIS — N1832 Chronic kidney disease, stage 3b: Secondary | ICD-10-CM | POA: Diagnosis not present

## 2023-10-15 ENCOUNTER — Telehealth: Payer: Self-pay | Admitting: Nurse Practitioner

## 2023-10-15 ENCOUNTER — Ambulatory Visit: Payer: Medicare Other | Admitting: Nurse Practitioner

## 2023-10-15 ENCOUNTER — Ambulatory Visit: Payer: Medicare Other

## 2023-10-15 ENCOUNTER — Telehealth: Payer: Self-pay | Admitting: *Deleted

## 2023-10-15 ENCOUNTER — Other Ambulatory Visit: Payer: Medicare Other

## 2023-10-15 DIAGNOSIS — M549 Dorsalgia, unspecified: Secondary | ICD-10-CM

## 2023-10-15 DIAGNOSIS — C50412 Malignant neoplasm of upper-outer quadrant of left female breast: Secondary | ICD-10-CM

## 2023-10-15 NOTE — Telephone Encounter (Signed)
 [name] called to report that PCP ordered a MRI of thoracic spine due to 6 week H/O back pain and difficulty walking/standing. PCP told her she could have metastatic cancer and to see Dr. Truett Perna. Scan report on MD desk for review.

## 2023-10-15 NOTE — Telephone Encounter (Signed)
 I called [name] to follow-up on her earlier message.  She reports back pain has worsened significantly over the past month.  She is taking tramadol  and utilizing a muscle relaxant.  No bowel or bladder dysfunction.  No leg weakness or numbness.  She agrees to come in for an appointment tomorrow.  She understands to proceed to the emergency department if she develops any neurologic type symptoms.

## 2023-10-16 ENCOUNTER — Ambulatory Visit (HOSPITAL_BASED_OUTPATIENT_CLINIC_OR_DEPARTMENT_OTHER)
Admission: RE | Admit: 2023-10-16 | Discharge: 2023-10-16 | Disposition: A | Discharge status: Home / Self Care | Payer: Medicare Other | Source: Ambulatory Visit | Attending: Nurse Practitioner | Admitting: Nurse Practitioner

## 2023-10-16 ENCOUNTER — Inpatient Hospital Stay: Payer: Medicare Other | Attending: Oncology

## 2023-10-16 ENCOUNTER — Inpatient Hospital Stay (HOSPITAL_BASED_OUTPATIENT_CLINIC_OR_DEPARTMENT_OTHER): Payer: Medicare Other | Admitting: Nurse Practitioner

## 2023-10-16 ENCOUNTER — Encounter: Payer: Self-pay | Admitting: Nurse Practitioner

## 2023-10-16 VITALS — BP 159/60 | HR 64 | Temp 98.2°F | Resp 18 | Ht 66.0 in | Wt 119.0 lb

## 2023-10-16 DIAGNOSIS — D75839 Thrombocytosis, unspecified: Secondary | ICD-10-CM | POA: Insufficient documentation

## 2023-10-16 DIAGNOSIS — Z1501 Genetic susceptibility to malignant neoplasm of breast: Secondary | ICD-10-CM | POA: Diagnosis not present

## 2023-10-16 DIAGNOSIS — D473 Essential (hemorrhagic) thrombocythemia: Secondary | ICD-10-CM | POA: Insufficient documentation

## 2023-10-16 DIAGNOSIS — C50412 Malignant neoplasm of upper-outer quadrant of left female breast: Secondary | ICD-10-CM | POA: Insufficient documentation

## 2023-10-16 DIAGNOSIS — Z5111 Encounter for antineoplastic chemotherapy: Secondary | ICD-10-CM | POA: Insufficient documentation

## 2023-10-16 DIAGNOSIS — Z1509 Genetic susceptibility to other malignant neoplasm: Secondary | ICD-10-CM | POA: Insufficient documentation

## 2023-10-16 DIAGNOSIS — M549 Dorsalgia, unspecified: Secondary | ICD-10-CM | POA: Diagnosis not present

## 2023-10-16 DIAGNOSIS — C50911 Malignant neoplasm of unspecified site of right female breast: Secondary | ICD-10-CM | POA: Diagnosis not present

## 2023-10-16 DIAGNOSIS — C787 Secondary malignant neoplasm of liver and intrahepatic bile duct: Secondary | ICD-10-CM | POA: Insufficient documentation

## 2023-10-16 DIAGNOSIS — Z170 Estrogen receptor positive status [ER+]: Secondary | ICD-10-CM | POA: Insufficient documentation

## 2023-10-16 DIAGNOSIS — C569 Malignant neoplasm of unspecified ovary: Secondary | ICD-10-CM | POA: Diagnosis not present

## 2023-10-16 DIAGNOSIS — C7951 Secondary malignant neoplasm of bone: Secondary | ICD-10-CM | POA: Diagnosis not present

## 2023-10-16 DIAGNOSIS — C50912 Malignant neoplasm of unspecified site of left female breast: Secondary | ICD-10-CM | POA: Diagnosis not present

## 2023-10-16 LAB — CMP (CANCER CENTER ONLY)
ALT: 39 U/L (ref 0–44)
AST: 28 U/L (ref 15–41)
Albumin: 3.4 g/dL — ABNORMAL LOW (ref 3.5–5.0)
Alkaline Phosphatase: 140 U/L — ABNORMAL HIGH (ref 38–126)
Anion gap: 7 (ref 5–15)
BUN: 24 mg/dL — ABNORMAL HIGH (ref 8–23)
CO2: 29 mmol/L (ref 22–32)
Calcium: 9.1 mg/dL (ref 8.9–10.3)
Chloride: 106 mmol/L (ref 98–111)
Creatinine: 1.31 mg/dL — ABNORMAL HIGH (ref 0.44–1.00)
GFR, Estimated: 39 mL/min — ABNORMAL LOW (ref >60)
Glucose, Bld: 121 mg/dL — ABNORMAL HIGH (ref 70–99)
Potassium: 3.4 mmol/L — ABNORMAL LOW (ref 3.5–5.1)
Sodium: 142 mmol/L (ref 135–145)
Total Bilirubin: 0.7 mg/dL (ref 0.0–1.2)
Total Protein: 5.8 g/dL — ABNORMAL LOW (ref 6.5–8.1)

## 2023-10-16 LAB — CBC WITH DIFFERENTIAL (CANCER CENTER ONLY)
Abs Immature Granulocytes: 0.06 10*3/uL (ref 0.00–0.07)
Basophils Absolute: 0.2 10*3/uL — ABNORMAL HIGH (ref 0.0–0.1)
Basophils Relative: 1 %
Eosinophils Absolute: 0.2 10*3/uL (ref 0.0–0.5)
Eosinophils Relative: 2 %
HCT: 38.2 % (ref 36.0–46.0)
Hemoglobin: 13 g/dL (ref 12.0–15.0)
Immature Granulocytes: 1 %
Lymphocytes Relative: 9 %
Lymphs Abs: 1 10*3/uL (ref 0.7–4.0)
MCH: 33.4 pg (ref 26.0–34.0)
MCHC: 34 g/dL (ref 30.0–36.0)
MCV: 98.2 fL (ref 80.0–100.0)
Monocytes Absolute: 0.6 10*3/uL (ref 0.1–1.0)
Monocytes Relative: 6 %
Neutro Abs: 9.5 10*3/uL — ABNORMAL HIGH (ref 1.7–7.7)
Neutrophils Relative %: 81 %
Platelet Count: 295 10*3/uL (ref 150–400)
RBC: 3.89 MIL/uL (ref 3.87–5.11)
RDW: 14.6 % (ref 11.5–15.5)
WBC Count: 11.5 10*3/uL — ABNORMAL HIGH (ref 4.0–10.5)
nRBC: 0 % (ref 0.0–0.2)

## 2023-10-16 MED ORDER — OXYCODONE HCL 5 MG PO TABS: 2.5000 mg | ORAL_TABLET | Freq: Four times a day (QID) | ORAL | 0 refills | Status: DC | PRN

## 2023-10-16 NOTE — Progress Notes (Signed)
 [facility] OFFICE PROGRESS NOTE   Diagnosis: Thrombocytosis, breast cancer  INTERVAL HISTORY:   [name] returns prior to scheduled follow-up for evaluation of back pain in the setting of an abnormal MRI thoracic spine.  She reports marked worsening of back pain over the past month.  No bowel or bladder dysfunction.  Knees occasionally feel weak weak but in general no leg weakness or numbness.  No falls.  She is not aware of any injury..  She is taking tramadol  without relief.  MRI thoracic spine 10/09/2023 showed at least 80% vertebral body height loss anteriorly at T11 with bowing of the posterior cortex resulting in moderate spinal canal stenosis; abnormal signal extending into the posterior elements of T9, T10, T11 and T12 as well as into several adjacent ribs.  Right greater than left pleural effusions.  Bone findings noted to be concerning for metastatic disease.  Objective:  Vital signs in last 24 hours:  Blood pressure (!) 159/60, pulse 64, temperature 98.2 F (36.8 C), temperature source Temporal, resp. rate 18, height 5' 6 (1.676 m), weight 119 lb (54 kg), last menstrual period 10/07/1974, SpO2 100%.    Lymphatics: No palpable cervical, supraclavicular, axillary or inguinal lymph nodes.  Resp: Lungs clear bilaterally. Cardio: Regular rate and rhythm. GI: No hepatosplenomegaly. Vascular: Trace pitting edema lower leg bilaterally. Neuro: Upper and lower extremity motor strength intact. Breast: Bilateral lumpectomy scars, no evidence of recurrent tumor. Skin: No rash.  Musculoskeletal: Back is kyphotic.  Tender over the lower thoracic/upper lumbar spine region.   Lab Results:  Lab Results  Component Value Date   WBC 11.5 (H) 10/16/2023   HGB 13.0 10/16/2023   HCT 38.2 10/16/2023   MCV 98.2 10/16/2023   PLT 295 10/16/2023   NEUTROABS 9.5 (H) 10/16/2023    Imaging:  No results found.  Medications: I have reviewed the patient's current  medications.  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 Remote hysterectomy and bilateral salpingo-oophorectomy with the cytology from 1976 confirming numerous psammoma bodies. 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  Status post a needle localized lumpectomy and sentinel lymph node biopsy 04/13/2009 with the pathology confirming high-grade ductal carcinoma in situ with an associated 0.26 mm invasive carcinoma, ER positive, PR positive, and HER2 positive. Status post adjuvant right breast radiation completed 06/19/2009. Initiation of Arimidex  05/01/2009.  Completed 5 years in July 2015 Family history of breast cancer. History of Clostridium difficile colitis. Frequent bowel movements following the bowel resection in 2009. 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  Recent MRI showed a compression fracture at T11 and abnormal  signal involving several  other thoracic vertebral bodies and adjacent ribs.  She has a history of early-stage breast cancer, currently on Arimidex , and a history of abdominal carcinomatosis May 2009.  She understands the MRI findings could represent malignancy.  We are referring her for stat CTs chest/abdomen/pelvis.  She will try oxycodone  2.5 mg to 5 mg every 6 hours as needed for the back pain.  She understands to contact the [facility] emergency evaluation with onset of any neurologic type symptoms.  We will arrange for follow-up once CT results are available.  Patient seen with Dr. Cloretta.   Olam Ned ANP/GNP-BC   10/16/2023  12:05 PM This was a shared visit with Olam Ned.  [name] was interviewed and examined.  She is referred for oncology evaluation after a thoracic MRI revealed a severe T11 compression fracture concerning for malignancy.  We reviewed the MRI findings and images with her.  We are concerned she has developed metastatic breast cancer.  She will be referred for urgent staging CTs and a biopsy as indicated.  We adjusted the narcotic pain regimen.  She knows to call for neurologic symptoms.  I was present for greater than 50% of today's visit.  I performed medical decision making.  Arvella Cloretta, MD

## 2023-10-17 ENCOUNTER — Telehealth: Payer: Self-pay

## 2023-10-17 ENCOUNTER — Telehealth: Payer: Self-pay | Admitting: Nurse Practitioner

## 2023-10-17 LAB — CANCER ANTIGEN 27.29: CA 27.29: 104.2 U/mL — ABNORMAL HIGH (ref 0.0–38.6)

## 2023-10-17 NOTE — Telephone Encounter (Signed)
 The patient contacted the office and requested that GBS return her call regarding the CT scan she had yesterday. I informed the patient that he would call her this evening to discuss the results.

## 2023-10-17 NOTE — Telephone Encounter (Signed)
 I contacted Ms. Pelley to follow-up on the office visit from yesterday.  She reports improved pain control with the oxycodone .  I discussed the CT findings with her including lung nodules, liver lesions and bone lesions.  She understands findings are concerning for cancer.  She agrees with Dr. Andriette recommendation to proceed with a biopsy.  We will plan to see her in follow-up once the biopsy has been completed.  She will contact the office in the interim with any problems.  We specifically discussed worsening back pain, any neurologic type symptoms.

## 2023-10-20 ENCOUNTER — Telehealth: Payer: Self-pay

## 2023-10-20 ENCOUNTER — Ambulatory Visit: Payer: Medicare Other | Admitting: Physical Therapy

## 2023-10-20 NOTE — Progress Notes (Signed)
 Alison Cornet, MD  [name] PROCEDURE / BIOPSY REVIEW Date: 10/19/23  Requested Biopsy site: liver lesion Reason for request: needs diagnosis Imaging review: Best seen on CT CAP 10/16/23  Decision: Approved Imaging modality to perform: Ultrasound Schedule with: Moderate Sedation Schedule for: Any VIR  Additional comments: Several liver lesions  Please contact me with questions, concerns, or if issue pertaining to this request arise.  Powell Alison Philip, MD [facility]       Previous Messages    ----- Message ----- From: [name] Sent: 10/17/2023   3:14 PM EST To: [name]; Ir Procedure Requests Subject: US  liver biopsy                                Procedure : US  liver biopsy  Reason : h/o breast cancer, liver/lung/bone lesions on CT Dx: Carcinoma of upper-outer quadrant of left breast in female, estrogen receptor positive (HCC) [C50.412, Z17.0 (ICD-10-CM)]; Back pain, unspecified back location, unspecified back pain laterality, unspecified chronicity [M54.9 (ICD-10-CM)]; Thrombocytosis [I24.160 (ICD-10-CM)]    History : CT Chest abd/ pelv w/o, MRI thoracic spine , Mammogram  Provider : [name], NP  Provider contact: [phone]

## 2023-10-20 NOTE — Telephone Encounter (Addendum)
 The patient reported that she is taking one tablet of oxycodone  5 mg every six hours and is not experiencing any relief from her symptoms.Per Olam, the patient may take 2 tablets every six hours. The patient has acknowledged this information and will call back in the next two days if the 2 tablets do not provide relief.

## 2023-10-21 ENCOUNTER — Encounter: Payer: Self-pay | Admitting: *Deleted

## 2023-10-21 ENCOUNTER — Telehealth: Payer: Self-pay

## 2023-10-21 ENCOUNTER — Other Ambulatory Visit: Payer: Self-pay | Admitting: Nurse Practitioner

## 2023-10-21 ENCOUNTER — Telehealth: Payer: Self-pay | Admitting: Nurse Practitioner

## 2023-10-21 DIAGNOSIS — C50412 Malignant neoplasm of upper-outer quadrant of left female breast: Secondary | ICD-10-CM

## 2023-10-21 DIAGNOSIS — M549 Dorsalgia, unspecified: Secondary | ICD-10-CM

## 2023-10-21 DIAGNOSIS — D75839 Thrombocytosis, unspecified: Secondary | ICD-10-CM

## 2023-10-21 MED ORDER — DEXAMETHASONE 4 MG PO TABS: ORAL_TABLET | ORAL | 0 refills | Status: DC

## 2023-10-21 MED ORDER — OXYCODONE HCL 5 MG PO TABS: 5.0000 mg | ORAL_TABLET | Freq: Four times a day (QID) | ORAL | 0 refills | Status: DC | PRN

## 2023-10-21 NOTE — Progress Notes (Signed)
 PATIENT NAVIGATOR PROGRESS NOTE  Name: [name] Date: 10/21/2023 MRN: [id number]  DOB: [birth year]   Reason for visit:  Scheduling of biopsy  Comments:  Called [name] and she is now scheduled at [facility] on 10/30/23 with arrival time of 0900/  Instructed her to stop ASA 81mg  this Friday.  She verbalized understanding.      Time spent counseling/coordinating care: 30-45 minutes

## 2023-10-21 NOTE — Telephone Encounter (Signed)
 The patient called to report that she is taking two tablets of oxycodone  along with Tylenol , but is not experiencing any relief. She also inquired about the possibility of rescheduling her biopsy appointment to an earlier date. I informed the resident that I would work on facilitating the rescheduling of the appointment.

## 2023-10-21 NOTE — Telephone Encounter (Signed)
 I contacted [name] to follow-up on pain control.  She reports incomplete relief with oxycodone .  She will begin dexamethasone  4 mg twice daily for 3 days, then 4 mg daily.  We reviewed potential side effects associated with steroids.  She agrees with this plan.  We discussed the change in the biopsy date to 10/30/2023.  We will see her in follow-up a few days after the biopsy.  She will contact the office in the interim with any problems.

## 2023-10-22 ENCOUNTER — Other Ambulatory Visit: Payer: Self-pay | Admitting: Oncology

## 2023-10-22 ENCOUNTER — Ambulatory Visit: Payer: Medicare Other | Admitting: Physical Therapy

## 2023-10-27 ENCOUNTER — Ambulatory Visit: Payer: Medicare Other

## 2023-10-28 ENCOUNTER — Other Ambulatory Visit: Payer: Self-pay | Admitting: Radiology

## 2023-10-28 DIAGNOSIS — Z170 Estrogen receptor positive status [ER+]: Secondary | ICD-10-CM

## 2023-10-29 ENCOUNTER — Other Ambulatory Visit: Payer: Self-pay | Admitting: Radiology

## 2023-10-29 ENCOUNTER — Ambulatory Visit: Payer: Medicare Other

## 2023-10-29 NOTE — Progress Notes (Signed)
Patient for US guided liver lesion biopsy on Thurs 10/30/2023, I called and spoke with the patient on the phone and gave pre-procedure instructions. Pt was made aware to be here at 9a, last dose of ASA 81mg  was on Friday 10/24/23,  NPO after MN prior to procedure as well as driver post procedure/recovery/discharge. Pt stated understanding.  Called 10/24/2023 and LVM on 10/29/2023

## 2023-10-29 NOTE — Progress Notes (Signed)
Chief Complaint: Patient was seen in consultation today for history of breast cancer with liver lesions.   at the request of Rana Snare  Referring Physician(s): Rana Snare  Supervising Physician: Irish Lack  Patient Status: [facility] - Out-pt]  Full Code  History of Present Illness: BURLEY BARTOLUCCI is a 89 y.o. female with history of CAD, HTN, HLD, nephrolithiasis, heart murmur, GERD, breast cancer-s/p lumpectomy, radiation and chemotherapy (2010), and ovarian cancer-s/p hysterectomy and bilateral salpingo-oophorectomy. Patient presented to Rana Snare, NP, her oncology provider with c/o back pain and abnormal findings on a thoracic MRI.   Thoracic MRI 10/09/23 reported below.  1. At least 80% vertebral body height loss anteriorly at T11, with bowing of the posterior cortex, which extends approximately 6 mm posteriorly, resulting in moderate spinal canal stenosis. Abnormal signal extends into the posterior elements at T9, T10, T11, and T12, as well as into several of the adjacent ribs. Findings are concerning for metastatic disease. Recommend further evaluation with a contrast-enhanced MRI of the thoracic spine. 2. Severe bilateral neural foraminal narrowing at T10-T11 and T11-T12. 3. Right greater than left pleural effusion.  Patient had a subsequent CT chest/abdomen/pelvis 10/16/23 1. Findings are consistent with widespread metastatic disease, likely from the patient's breast cancer. Consider PET-CT for further staging. 2. Innumerable pulmonary nodules in both lungs, highly suspicious for metastatic disease. 3. Multiple new ill-defined low-density liver lesions consistent with metastatic disease. 4. Evidence of osseous metastatic disease in the thoracic and lumbar spine. Severe compression fracture at T11 with osseous retropulsion, better demonstrated on recent thoracic MRI. 5. Small right-greater-than-left pleural effusions with associated bibasilar  atelectasis. 6. Nonobstructing bilateral renal calculi. 7. Stable right adrenal nodule, likely an adenoma. 8.  Aortic Atherosclerosis (ICD10-I70.0).   Patient was referred for a liver lesion biopsy with interventional radiology. She reports having a lesion on her liver and she is here for a biopsy. Patient reports that she is feeling well today. Denies: chest pain, shortness of breath, nausea, vomiting, fever, and/or abdominal pain.    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   Breast cancer (HCC) 2010   RIGHT   CAD (coronary artery disease)    Dysuria    Family history of breast cancer    GERD (gastroesophageal reflux disease)    HX OF   Heart murmur    History of blood transfusion    History of breast cancer ONCOLOGIST-- DR Truett Perna   DX 2010--  RIGHT BREAST DCIS  HIGH GRADE S/P LUMPECTOMY W/ SLN BX--  NO RECURRENCE   History of kidney stones    History of ovarian cancer    2009--  S/P COLON RESECTION FOR PAPILLARY SEROUS CARCINOMA, BORDERLINE OVARIAN CANCER VERSUS PRIMARY PERITONEAL CARCINOMA WITH PSAMMOMA BODIES--  NO RECURRENCE   History of skin cancer    excision basal cell   Hyperlipidemia    Hypertension    Kidney stones 7/14   Nocturia    Ovarian carcinoma (HCC)    papillary serous-low grade, recurrence found in fat necrosis during ileostomy   Renal stones 2014   Shingles 30 YRS AGO   Ureteral calculi    bilateral    Past Surgical History:  Procedure Laterality Date   APPENDECTOMY     BREAST LUMPECTOMY Right 04-13-2009   W/ SLN BX   BREAST LUMPECTOMY WITH RADIOACTIVE SEED LOCALIZATION Left 06/11/2019   Procedure: LEFT BREAST LUMPECTOMY WITH RADIOACTIVE  SEED LOCALIZATION;  Surgeon: Griselda Miner, MD;  Location: [facility] OR;  Service: General;  Laterality: Left;   CARDIAC CATHETERIZATION  11-11-2005 DR Verdis Prime   MODERATE LAD DISEASE/ NORMAL LVF/ EF 65-75%   CATARACT EXTRACTION W/  INTRAOCULAR LENS IMPLANT Bilateral 04/26/2013   CHOLECYSTECTOMY  1990   OPEN   COLONOSCOPY     CYSTOSCOPY W/ URETERAL STENT PLACEMENT Bilateral 05/07/2013   Procedure: CYSTOSCOPY WITH STENT REPLACEMENTS;  Surgeon: Sebastian Ache, MD;  Location: [facility];  Service: Urology;  Laterality: Bilateral;   CYSTOSCOPY WITH BIOPSY N/A 04/07/2013   Procedure: CYSTOSCOPY WITH BIOPSY and fulgeration;  Surgeon: Sebastian Ache, MD;  Location: [facility];  Service: Urology;  Laterality: N/A;   CYSTOSCOPY WITH RETROGRADE PYELOGRAM, URETEROSCOPY AND STENT PLACEMENT Bilateral 05/07/2013   Procedure: CYSTOSCOPY WITH RETROGRADE PYELOGRAM, URETEROSCOPY ;  Surgeon: Sebastian Ache, MD;  Location: [facility];  Service: Urology;  Laterality: Bilateral;   CYSTOSCOPY WITH RETROGRADE PYELOGRAM, URETEROSCOPY AND STENT PLACEMENT Bilateral 01/06/2019   Procedure: CYSTOSCOPY WITH RETROGRADE PYELOGRAM, URETEROSCOPY AND STENT PLACEMENT, FIRST STAGE;  Surgeon: Sebastian Ache, MD;  Location: [facility];  Service: Urology;  Laterality: Bilateral;   CYSTOSCOPY WITH RETROGRADE PYELOGRAM, URETEROSCOPY AND STENT PLACEMENT Bilateral 01/27/2019   Procedure: CYSTOSCOPY WITH RETROGRADE PYELOGRAM, URETEROSCOPY AND STENT PLACEMENT;  Surgeon: Sebastian Ache, MD;  Location: [facility];  Service: Urology;  Laterality: Bilateral;  75 MINS   CYSTOSCOPY WITH STENT PLACEMENT Bilateral 04/07/2013   Procedure: CYSTOSCOPY WITH STENT PLACEMENT;  Surgeon: Sebastian Ache, MD;  Location: [facility];  Service: Urology;  Laterality: Bilateral;   DX LAPAROSCOPY W/ PERITONEAL AND OMENTAL BX'S AND WASHINGS  02-16-2008   EXCISION RIGHT BREAST MASS  02-10-2002   EXP. LAP. EXTENSIVE ADHESIOLYSIS/ RESECTION TERMINAL ILEUM , ASCENDING AND DESCENDING COLON WITH CREATION ILEOSTOMY AND MUCOUS FISTULA  02-19-2008   PERFERATION AND ABD. CANCER--  TAKEDOWN ILEOSTOMY 08-23-2008   HOLMIUM LASER APPLICATION Bilateral 01/06/2019   Procedure: HOLMIUM LASER APPLICATION;   Surgeon: Sebastian Ache, MD;  Location: [facility];  Service: Urology;  Laterality: Bilateral;   HOLMIUM LASER APPLICATION Bilateral 01/27/2019   Procedure: HOLMIUM LASER APPLICATION;  Surgeon: Sebastian Ache, MD;  Location: [facility];  Service: Urology;  Laterality: Bilateral;   I & D EXTREMITY Right 02/02/2022   Procedure: IRRIGATION AND DEBRIDEMENT HAND;  Surgeon: Bradly Bienenstock, MD;  Location: [facility] OR;  Service: Orthopedics;  Laterality: Right;   ILEOSTOMY CLOSURE  08/2008   TOTAL ABDOMINAL HYSTERECTOMY W/ BILATERAL SALPINGOOPHORECTOMY  1976   TOTAL ABDOMINAL HYSTERECTOMY W/ BILATERAL SALPINGOOPHORECTOMY  1976   VULVAR LESION REMOVAL N/A 08/22/2014   Procedure: EXCISION OF VULVAR CYST  ;  Surgeon: Annamaria Boots, MD;  Location: [facility];  Service: Gynecology;  Laterality: N/A;   WOUND DEBRIDEMENT  05/28/2012   Procedure: DEBRIDEMENT ABDOMINAL WOUND;  Surgeon: Currie Paris, MD;  Location: [facility];  Service: General;  Laterality: N/A;  excision chronic wound abdominal wall    Allergies: Ramipril  Medications: Prior to Admission medications   Medication Sig Start Date End Date Taking? Authorizing Provider  amLODipine (NORVASC) 10 MG tablet Take 1 tablet (10 mg total) by mouth daily. Patient taking differently: Take 10 mg by mouth daily with lunch. 06/11/17   Lyn Records, MD  anastrozole (ARIMIDEX) 1 MG tablet Take 1 tablet (1 mg total) by mouth daily with supper. 11/04/22   Ladene Artist, MD  aspirin EC 81 MG tablet Take 81 mg by mouth daily.  [provider]  BELSOMRA 10 MG TABS Take 10 mg by mouth at bedtime as needed (sleep).  01/20/19   [provider]  Calcium Carbonate-Vitamin D (CALTRATE 600+D PO) Take 1 tablet by mouth 2 (two) times daily.    [provider]  carvedilol (COREG) 25 MG tablet Take 1 tablet (25 mg total) by mouth 2 (two) times daily with a meal. 02/27/22   Lyn Records, MD  chlorthalidone (HYGROTON) 25 MG tablet Take  0.5 tablets (12.5 mg total) by mouth daily. Patient taking differently: Take 12.5 mg by mouth in the morning and at bedtime. 02/15/21   Lyn Records, MD  Cholecalciferol (VITAMIN D3) 50 MCG (2000 UT) TABS Take 2,000 Units by mouth in the morning and at bedtime.    [provider]  dexamethasone (DECADRON) 4 MG tablet Take 1 tab (4 mg) twice a day for 3 days then take 1 tab daily 10/21/23   Rana Snare, NP  hydrALAZINE (APRESOLINE) 10 MG tablet Take 1 tablet (10 mg total) by mouth in the morning and at bedtime. Patient taking differently: Take 25 mg by mouth 3 (three) times daily. 05/31/21   Lyn Records, MD  hydroxychloroquine (PLAQUENIL) 200 MG tablet Take 200 mg by mouth daily. 11/08/21   [provider]  hydroxyurea (HYDREA) 500 MG capsule TAKE 1 CAPSULE BY MOUTH EVERY MONDAY, WEDNESDAY, AND FRIDAY. MAY TAKE WITH FOOD TO MINIMIZE GI SIDE EFFECTS 10/23/23   Ladene Artist, MD  isosorbide mononitrate (IMDUR) 60 MG 24 hr tablet Take 1 tablet (60 mg total) by mouth daily. 05/16/23   Jake Bathe, MD  oxyCODONE (OXY IR/ROXICODONE) 5 MG immediate release tablet Take 1-2 tablets (5-10 mg total) by mouth every 6 (six) hours as needed for severe pain (pain score 7-10). 10/21/23   Rana Snare, NP  PROCTOZONE-HC 2.5 % rectal cream PLACE 1 APPLICATION RECTALLY 2 TIMES A DAY AS NEEDED FOR HEMORRHOIDS. Patient taking differently: Place 1 application  rectally 2 (two) times daily as needed for hemorrhoids. 01/09/15   Cross, Efraim Kaufmann D, NP  rosuvastatin (CRESTOR) 10 MG tablet Take 1 tablet by mouth every other day.    [provider]  triamcinolone (KENALOG) 0.1 % Apply 1 application. topically as needed (rash). 04/04/20   [provider]     Family History  Problem Relation Age of Onset   Heart disease Mother    Hyperlipidemia Mother    Breast cancer Sister    Cancer Sister        breast    Social History   Socioeconomic History   Marital status: Widowed     Spouse name: Not on file   Number of children: Not on file   Years of education: Not on file   Highest education level: Not on file  Occupational History   Not on file  Tobacco Use   Smoking status: Never   Smokeless tobacco: Never  Vaping Use   Vaping status: Never Used  Substance and Sexual Activity   Alcohol use: No   Drug use: No   Sexual activity: Never    Partners: Male    Birth control/protection: Surgical    Comment: Hysterectomy  Other Topics Concern   Not on file  Social History Narrative   Not on file   Social Drivers of Health   Financial Resource Strain: Not on file  Food Insecurity: Not on file  Transportation Needs: No Transportation Needs (05/17/2019)   PRAPARE - Transportation  Lack of Transportation (Medical): No    Lack of Transportation (Non-Medical): No  Physical Activity: Not on file  Stress: Not on file  Social Connections: Not on file    Review of Systems: A 12 point ROS discussed and pertinent positives are indicated in the HPI above.  All other systems are negative.  Review of Systems  Constitutional:  Negative for fever.  Respiratory:  Negative for cough and shortness of breath.   Cardiovascular:  Negative for chest pain.  Gastrointestinal:  Negative for abdominal pain, diarrhea, nausea and vomiting.  Psychiatric/Behavioral:  Negative for confusion.     Vital Signs: BP (!) 144/61   Pulse 65   Temp 97.8 F (36.6 C) (Oral)   Resp 11   Ht 5\' 6"  (1.676 m)   Wt 113 lb (51.3 kg)   LMP 10/07/1974   SpO2 97%   BMI 18.24 kg/m   Advance Care Plan: The advanced care plan/surrogate decision maker was discussed at the time of visit and documented in the medical record.    Physical Exam HENT:     Head: Normocephalic.     Mouth/Throat:     Mouth: Mucous membranes are moist.     Pharynx: Oropharynx is clear.  Cardiovascular:     Rate and Rhythm: Normal rate and regular rhythm.  Pulmonary:     Effort: Pulmonary effort is normal. No  respiratory distress.  Abdominal:     Palpations: Abdomen is soft.     Tenderness: There is no abdominal tenderness.  Skin:    General: Skin is warm.  Neurological:     General: No focal deficit present.     Mental Status: She is alert and oriented to person, place, and time.  Psychiatric:        Mood and Affect: Mood normal.        Thought Content: Thought content normal.        Judgment: Judgment normal.     Imaging: CT CHEST ABDOMEN PELVIS WO CONTRAST Result Date: 10/16/2023 CLINICAL DATA:  Severe mid to low back pain. History of bilateral breast cancer with radiation and chemotherapy. History of ovarian cancer. * Tracking Code: BO * EXAM: CT CHEST, ABDOMEN AND PELVIS WITHOUT CONTRAST TECHNIQUE: Multidetector CT imaging of the chest, abdomen and pelvis was performed following the standard protocol without IV contrast. RADIATION DOSE REDUCTION: This exam was performed according to the departmental dose-optimization program which includes automated exposure control, adjustment of the mA and/or kV according to patient size and/or use of iterative reconstruction technique. COMPARISON:  Thoracic MRI 10/09/2023.  Abdominopelvic CT 12/31/2018. FINDINGS: CT CHEST FINDINGS Cardiovascular: Diffuse atherosclerosis of the aorta, great vessels and coronary arteries. Small pericardial effusion. The heart size is normal. Mediastinum/Nodes: There are no enlarged mediastinal, hilar, internal mammary or axillary lymph nodes. Hilar assessment is limited by the lack of intravenous contrast. The thyroid gland, trachea and esophagus demonstrate no significant findings. Lungs/Pleura: Small right-greater-than-left pleural effusions without apparent nodularity on noncontrast imaging. Innumerable pulmonary nodules in both lungs, highly suspicious for metastatic disease. The nodules at the lung bases are new compared with the previous abdominal CT. Largest nodules include a 9 mm nodule in the superior segment of the right  lower lobe on image 54/4, a 7 mm left lower lobe nodule on image 45/4 and a 6 mm left lower lobe nodule on image 105/4. Mild dependent opacities in both lung bases, likely atelectasis related to the pleural effusions. Musculoskeletal/Chest wall: As seen on recent thoracic MRI, there is  a severe compression fracture at T11 with osseous retropulsion. There are moderate lucencies throughout the T10, T11 and T12 vertebral bodies, suspicious for osseous metastatic disease. Probable involvement of the sternum. CT ABDOMEN AND PELVIS FINDINGS Hepatobiliary: There are multiple new ill-defined low-density liver lesions consistent with widespread metastatic disease. Representative lesions include a 2.9 cm lesion posteriorly in the dome of the right hepatic lobe on image 45/2 and a 2.8 cm lesion superior to the gallbladder fossa on image 53/2. Status post cholecystectomy without evidence of biliary dilatation. Pancreas: Atrophy. No focal abnormality or surrounding inflammation identified. Spleen: Normal in size without focal abnormality. Adrenals/Urinary Tract: Unchanged 2.0 cm right adrenal nodule on image 57/2. Although this has a nonspecific density, this is consistent with an adenoma based on stability from previous CT. The left adrenal gland appears normal. Nonobstructing bilateral renal calculi. No evidence of ureteral calculus or hydronephrosis. The bladder appears normal for its degree of distention. Stomach/Bowel: No enteric contrast administered. The stomach appears unremarkable for its degree of distension. No evidence of bowel wall thickening, distention or surrounding inflammatory change. Previous right hemicolectomy. Prominent stool in the distal colon. Vascular/Lymphatic: No enlarged abdominal or pelvic lymph nodes are identified on this noncontrast study. Diffuse aortic and branch vessel atherosclerosis without evidence of aneurysm. Reproductive: Hysterectomy.  No evidence of adnexal mass. Other: Postsurgical  changes in the anterior abdominal wall. No ascites, peritoneal nodularity or pneumoperitoneum. Musculoskeletal: Evidence for metastatic disease within the lumbar spine, most obvious within the posterior elements at L4. No pathologic fractures are identified in the lumbar spine. There is a degenerative anterolisthesis at L5-S1 associated with chronic foraminal narrowing bilaterally. IMPRESSION: 1. Findings are consistent with widespread metastatic disease, likely from the patient's breast cancer. Consider PET-CT for further staging. 2. Innumerable pulmonary nodules in both lungs, highly suspicious for metastatic disease. 3. Multiple new ill-defined low-density liver lesions consistent with metastatic disease. 4. Evidence of osseous metastatic disease in the thoracic and lumbar spine. Severe compression fracture at T11 with osseous retropulsion, better demonstrated on recent thoracic MRI. 5. Small right-greater-than-left pleural effusions with associated bibasilar atelectasis. 6. Nonobstructing bilateral renal calculi. 7. Stable right adrenal nodule, likely an adenoma. 8.  Aortic Atherosclerosis (ICD10-I70.0). Electronically Signed   By: Carey Bullocks M.D.   On: 10/16/2023 14:35   MR THORACIC SPINE WO CONTRAST Result Date: 10/14/2023 CLINICAL DATA:  Mid back pain for 6 weeks, difficulty walking and standing EXAM: MRI THORACIC SPINE WITHOUT CONTRAST TECHNIQUE: Multiplanar, multisequence MR imaging of the thoracic spine was performed. No intravenous contrast was administered. COMPARISON:  No prior MRI of the thoracic spine available, correlation is made with 12/31/2018 CT abdomen pelvis FINDINGS: Evaluation is somewhat limited by motion artifact. Alignment: No traumatic listhesis. Exaggeration of the normal thoracic kyphosis centered on T11, described below. Vertebrae: At least 80% vertebral body height loss anteriorly at T11, with bowing of the posterior cortex, which extends approximately 6 mm posteriorly.  Evaluation is somewhat limited by the absence of intravenous contrast. Decreased T1 and mildly increased T2 signal, most prominently in the T10, T11, and T12 vertebral bodies, but also noted in the posterior aspect of T8 and T9. This abnormal signal likely extends along the left lateral aspect of T10-T12 and along the right lateral aspect of T8-T12 abnormal signal extends into the posterior elements at T9, T10, T11, and T12, as well and into several of the adjacent ribs. Marrow signal is otherwise within normal limits, with multiple T1 and T2 hyperintense foci, favored to be benign hemangiomas.  Cord:  Normal signal and morphology. Paraspinal and other soft tissues: Right greater than left pleural effusion. Disc levels: Moderate spinal canal stenosis at T11 (series 120, image 36). No other significant spinal canal stenosis. Severe bilateral neural foraminal narrowing at T10-T11 and T11-T12. Mild right neural foraminal narrowing at T9-T10. IMPRESSION: 1. At least 80% vertebral body height loss anteriorly at T11, with bowing of the posterior cortex, which extends approximately 6 mm posteriorly, resulting in moderate spinal canal stenosis. Abnormal signal extends into the posterior elements at T9, T10, T11, and T12, as well as into several of the adjacent ribs. Findings are concerning for metastatic disease. Recommend further evaluation with a contrast-enhanced MRI of the thoracic spine. 2. Severe bilateral neural foraminal narrowing at T10-T11 and T11-T12. 3. Right greater than left pleural effusion. Electronically Signed   By: Wiliam Ke M.D.   On: 10/14/2023 15:47    Labs:  CBC: Recent Labs    06/17/23 1011 09/09/23 0945 10/16/23 1111 10/30/23 0921  WBC 14.0* 12.9* 11.5* 25.4*  HGB 12.0 11.8* 13.0 13.4  HCT 36.4 35.8* 38.2 39.2  PLT 377 352 295 353    COAGS: Recent Labs    10/30/23 0921  INR 1.2    BMP: Recent Labs    05/13/23 1102 06/17/23 1011 09/09/23 0945 10/16/23 1111  NA 136  141 143 142  K 3.6 4.1 4.2 3.4*  CL 105 110 109 106  CO2 20* 23 26 29   GLUCOSE 213* 106* 112* 121*  BUN 60* 43* 29* 24*  CALCIUM 8.7* 9.3 8.8* 9.1  CREATININE 1.69* 1.59* 1.26* 1.31*  GFRNONAA 29* 31* 42* 39*    LIVER FUNCTION TESTS: Recent Labs    05/13/23 1102 06/17/23 1011 09/09/23 0945 10/16/23 1111  BILITOT 0.8 0.5 0.6 0.7  AST 20 48* 27 28  ALT 60* 63* 47* 39  ALKPHOS 266* 106 129* 140*  PROT 6.2* 6.6 5.9* 5.8*  ALBUMIN 3.6 3.9 3.6 3.4*    TUMOR MARKERS: No results for input(s): "AFPTM", "CEA", "CA199", "CHROMGRNA" in the last 8760 hours.  Assessment and Plan: [name] is a 89 y.o. female with history of CAD, HTN, HLD, nephrolithiasis, heart murmur, GERD, breast cancer-s/p lumpectomy, radiation and chemotherapy (2010), and ovarian cancer-s/p hysterectomy and bilateral salpingo-oophorectomy. Patient presented to Rana Snare, NP, her oncology provider with c/o back pain and abnormal findings on a thoracic MRI.   Thoracic MRI 10/09/23 reported below.  1. At least 80% vertebral body height loss anteriorly at T11, with bowing of the posterior cortex, which extends approximately 6 mm posteriorly, resulting in moderate spinal canal stenosis. Abnormal signal extends into the posterior elements at T9, T10, T11, and T12, as well as into several of the adjacent ribs. Findings are concerning for metastatic disease. Recommend further evaluation with a contrast-enhanced MRI of the thoracic spine. 2. Severe bilateral neural foraminal narrowing at T10-T11 and T11-T12. 3. Right greater than left pleural effusion.  Patient had a subsequent CT chest/abdomen/pelvis 10/16/23 1. Findings are consistent with widespread metastatic disease, likely from the patient's breast cancer. Consider PET-CT for further staging. 2. Innumerable pulmonary nodules in both lungs, highly suspicious for metastatic disease. 3. Multiple new ill-defined low-density liver lesions consistent with  metastatic disease. 4. Evidence of osseous metastatic disease in the thoracic and lumbar spine. Severe compression fracture at T11 with osseous retropulsion, better demonstrated on recent thoracic MRI. 5. Small right-greater-than-left pleural effusions with associated bibasilar atelectasis. 6. Nonobstructing bilateral renal calculi. 7. Stable right adrenal nodule, likely  an adenoma. 8.  Aortic Atherosclerosis (ICD10-I70.0).   Patient was referred for a liver lesion biopsy with interventional radiology. She reports having a lesion on her liver and she is here for a biopsy. Patient reports that she is feeling well today. Denies: chest pain, shortness of breath, nausea, vomiting, fever, and/or abdominal pain.  Risks and benefits of liver lesion biopsy was discussed with the patient and/or patient's family including, but not limited to bleeding, infection, damage to adjacent structures or low yield requiring additional tests.  All of the questions were answered and there is agreement to proceed.  Consent signed and in chart.  Thank you for this interesting consult.  I greatly enjoyed meeting MIRANDAH SLOMSKI and look forward to participating in their care.  A copy of this report was sent to the requesting provider on this date.  Electronically Signed: Rosalita Levan, PA 10/30/2023, 10:16 AM   I spent a total of  15 Minutes   in face to face in clinical consultation, greater than 50% of which was counseling/coordinating care for history of breast cancer with liver lesions.

## 2023-10-30 ENCOUNTER — Ambulatory Visit
Admission: RE | Admit: 2023-10-30 | Discharge: 2023-10-30 | Disposition: A | Discharge status: Home / Self Care | Payer: Medicare Other | Source: Ambulatory Visit | Attending: Nurse Practitioner | Admitting: Nurse Practitioner

## 2023-10-30 ENCOUNTER — Other Ambulatory Visit: Payer: Self-pay

## 2023-10-30 DIAGNOSIS — C7951 Secondary malignant neoplasm of bone: Secondary | ICD-10-CM | POA: Insufficient documentation

## 2023-10-30 DIAGNOSIS — C50412 Malignant neoplasm of upper-outer quadrant of left female breast: Secondary | ICD-10-CM | POA: Insufficient documentation

## 2023-10-30 DIAGNOSIS — C50919 Malignant neoplasm of unspecified site of unspecified female breast: Secondary | ICD-10-CM | POA: Diagnosis not present

## 2023-10-30 DIAGNOSIS — J90 Pleural effusion, not elsewhere classified: Secondary | ICD-10-CM | POA: Insufficient documentation

## 2023-10-30 DIAGNOSIS — K219 Gastro-esophageal reflux disease without esophagitis: Secondary | ICD-10-CM | POA: Insufficient documentation

## 2023-10-30 DIAGNOSIS — E785 Hyperlipidemia, unspecified: Secondary | ICD-10-CM | POA: Diagnosis not present

## 2023-10-30 DIAGNOSIS — I10 Essential (primary) hypertension: Secondary | ICD-10-CM | POA: Diagnosis not present

## 2023-10-30 DIAGNOSIS — K769 Liver disease, unspecified: Secondary | ICD-10-CM | POA: Diagnosis not present

## 2023-10-30 DIAGNOSIS — Z170 Estrogen receptor positive status [ER+]: Secondary | ICD-10-CM | POA: Insufficient documentation

## 2023-10-30 DIAGNOSIS — D75839 Thrombocytosis, unspecified: Secondary | ICD-10-CM | POA: Diagnosis not present

## 2023-10-30 DIAGNOSIS — I2510 Atherosclerotic heart disease of native coronary artery without angina pectoris: Secondary | ICD-10-CM | POA: Diagnosis not present

## 2023-10-30 DIAGNOSIS — Z90710 Acquired absence of both cervix and uterus: Secondary | ICD-10-CM | POA: Diagnosis not present

## 2023-10-30 DIAGNOSIS — Z923 Personal history of irradiation: Secondary | ICD-10-CM | POA: Insufficient documentation

## 2023-10-30 DIAGNOSIS — C787 Secondary malignant neoplasm of liver and intrahepatic bile duct: Secondary | ICD-10-CM | POA: Diagnosis not present

## 2023-10-30 DIAGNOSIS — Z90722 Acquired absence of ovaries, bilateral: Secondary | ICD-10-CM | POA: Insufficient documentation

## 2023-10-30 DIAGNOSIS — M4854XA Collapsed vertebra, not elsewhere classified, thoracic region, initial encounter for fracture: Secondary | ICD-10-CM | POA: Insufficient documentation

## 2023-10-30 DIAGNOSIS — Z8543 Personal history of malignant neoplasm of ovary: Secondary | ICD-10-CM | POA: Insufficient documentation

## 2023-10-30 DIAGNOSIS — N200 Calculus of kidney: Secondary | ICD-10-CM | POA: Insufficient documentation

## 2023-10-30 DIAGNOSIS — J9811 Atelectasis: Secondary | ICD-10-CM | POA: Insufficient documentation

## 2023-10-30 DIAGNOSIS — I700 Atherosclerosis of aorta: Secondary | ICD-10-CM | POA: Insufficient documentation

## 2023-10-30 DIAGNOSIS — M549 Dorsalgia, unspecified: Secondary | ICD-10-CM | POA: Diagnosis not present

## 2023-10-30 LAB — CBC
HCT: 39.2 % (ref 36.0–46.0)
Hemoglobin: 13.4 g/dL (ref 12.0–15.0)
MCH: 33.3 pg (ref 26.0–34.0)
MCHC: 34.2 g/dL (ref 30.0–36.0)
MCV: 97.5 fL (ref 80.0–100.0)
Platelets: 353 10*3/uL (ref 150–400)
RBC: 4.02 MIL/uL (ref 3.87–5.11)
RDW: 14.1 % (ref 11.5–15.5)
WBC: 25.4 10*3/uL — ABNORMAL HIGH (ref 4.0–10.5)
nRBC: 0 % (ref 0.0–0.2)

## 2023-10-30 LAB — PROTIME-INR
INR: 1.2 (ref 0.8–1.2)
Prothrombin Time: 15.1 s (ref 11.4–15.2)

## 2023-10-30 MED ORDER — FENTANYL CITRATE (PF) 100 MCG/2ML IJ SOLN
INTRAMUSCULAR | Status: AC | PRN
  Administered 2023-10-30 (×2): 25 ug via INTRAVENOUS

## 2023-10-30 MED ORDER — SODIUM CHLORIDE 0.9 % IV SOLN: INTRAVENOUS | Status: DC

## 2023-10-30 MED ORDER — MIDAZOLAM HCL 2 MG/2ML IJ SOLN
INTRAMUSCULAR | Status: AC
  Filled 2023-10-30: qty 2

## 2023-10-30 MED ORDER — MIDAZOLAM HCL 2 MG/2ML IJ SOLN
INTRAMUSCULAR | Status: AC | PRN
  Administered 2023-10-30 (×2): .5 mg via INTRAVENOUS

## 2023-10-30 MED ORDER — FENTANYL CITRATE (PF) 100 MCG/2ML IJ SOLN
INTRAMUSCULAR | Status: AC
  Filled 2023-10-30: qty 2

## 2023-10-30 MED ORDER — HYDROCODONE-ACETAMINOPHEN 5-325 MG PO TABS
1.0000 | ORAL_TABLET | ORAL | Status: DC | PRN
Start: 2023-10-30 — End: 2023-10-31

## 2023-10-30 MED ORDER — LIDOCAINE HCL (PF) 1 % IJ SOLN
10.0000 mL | Freq: Once | INTRAMUSCULAR | Status: AC
  Administered 2023-10-30: 10 mL via INTRADERMAL

## 2023-10-30 NOTE — Procedures (Signed)
Interventional Radiology Procedure Note  Procedure: US Guided Biopsy of liver lesion  Complications: None  Estimated Blood Loss: < 10 mL  Findings: 18 G core biopsy of 2.5 cm right lobe liver lesion performed under US guidance.  Three core samples obtained and sent to Pathology.  Jodi Marble. Fredia Sorrow, M.D Pager:  [phone]

## 2023-10-30 NOTE — Discharge Instructions (Signed)
Liver Biopsy, Care After These instructions give you information about how to care for yourself after your procedure. Your health care provider may also give you more specific instructions. If you have problems or questions, contact your health care provider. What can I expect after the procedure? After your procedure, it is common to have:  Pain and soreness in the area where the biopsy was done.  Bruising around the area where the biopsy was done.  Sleepiness and fatigue for 1-2 days. Follow these instructions at home: Medicines  Take over-the-counter and prescription medicines only as told by your health care provider.  If you were prescribed an antibiotic medicine, take it as told by your health care provider. Do not stop taking the antibiotic even if you start to feel better.  Do not take medicines such as aspirin and ibuprofen unless your health care provider tells you to take them. These medicines thin your blood and can increase the risk of bleeding.  If you are taking prescription pain medicine, take actions to prevent or treat constipation. Your health care provider may recommend that you: ? Drink enough fluid to keep your urine pale yellow. ? Eat foods that are high in fiber, such as fresh fruits and vegetables, whole grains, and beans. ? Limit foods that are high in fat and processed sugars, such as fried or sweet foods. ? Take an over-the-counter or prescription medicine for constipation. Incision care ? Wash your hands with soap and water before you change your bandage (dressing). If soap and water are not available, use hand sanitizer. ? Change your bandage tomorrow after you shower and then remove the next day.  Check your incision area every day for signs of infection. Check for: ? Redness, swelling, or pain. ? Fluid or blood. ? Warmth. ? Pus or a bad smell. You may shower tomorrow  No lifting more than 5 lbs for 3 days  Return to your normal activities as told  by your health care provider.   Do not drive or use heavy machinery for 24hrs or while taking prescription pain medicine.  Do not play contact sports for 2 weeks after the procedure. General instructions   Do not drink alcohol in the first week after the procedure.  Have someone stay with you for at least 24 hours after the procedure.  It is your responsibility to obtain your test results. Ask your health care provider, or the department that is doing the test: ? When will my results be ready? ? How will I get my results? ? What are my treatment options? ? What other tests do I need? ? What are my next steps?  Keep all follow-up visits as told by your health care provider. This is important. Contact a health care provider if:  You have increased bleeding from an incision, resulting in more than a small spot of blood.  You have redness, swelling, or increasing pain in any incisions.  You notice a discharge or a bad smell coming from any of your incisions.  You have a fever or chills. Get help right away if:  You develop swelling, bloating, or pain in your abdomen.  You become dizzy or faint.  You develop a rash.  You have nausea or you vomit.  You faint, or you have shortness of breath or difficulty breathing.  You develop chest pain.  You have problems with your speech or vision.  You have trouble with your balance or moving your arms or legs. Summary  After the liver biopsy, it is common to have pain, soreness, and bruising in the area, as well as sleepiness and fatigue.  Take over-the-counter and prescription medicines only as told by your health care provider.  Follow instructions from your health care provider about how to care for your incision. Check the incision area daily for signs of infection. This information is not intended to replace advice given to you by your health care provider. Make sure you discuss any questions you have with your health care  provider. Document Released: 04/12/2005 Document Revised: 11/16/2018 Document Reviewed: 10/03/2017 Elsevier Patient Education  2020 [employer].

## 2023-10-31 ENCOUNTER — Encounter: Payer: Self-pay | Admitting: *Deleted

## 2023-10-31 LAB — SURGICAL PATHOLOGY

## 2023-10-31 NOTE — Progress Notes (Signed)
PATIENT NAVIGATOR PROGRESS NOTE  Name: [name] Date: 10/31/2023 MRN: [id number]  DOB: [birth year]   Reason for visit:  Molecular studies  Comments:   Called [facility] and requested Breast Prognostics on liver biopsy from 10/30/23    Time spent counseling/coordinating care: 15-30 minutes

## 2023-11-04 ENCOUNTER — Encounter: Payer: Self-pay | Admitting: Nurse Practitioner

## 2023-11-04 ENCOUNTER — Telehealth: Payer: Self-pay | Admitting: Radiation Oncology

## 2023-11-04 ENCOUNTER — Inpatient Hospital Stay (HOSPITAL_BASED_OUTPATIENT_CLINIC_OR_DEPARTMENT_OTHER): Payer: Medicare Other | Admitting: Nurse Practitioner

## 2023-11-04 ENCOUNTER — Telehealth: Payer: Self-pay

## 2023-11-04 ENCOUNTER — Encounter: Payer: Self-pay | Admitting: *Deleted

## 2023-11-04 VITALS — BP 166/71 | HR 84 | Temp 98.2°F | Resp 18 | Ht 66.0 in | Wt 112.0 lb

## 2023-11-04 DIAGNOSIS — C787 Secondary malignant neoplasm of liver and intrahepatic bile duct: Secondary | ICD-10-CM | POA: Diagnosis not present

## 2023-11-04 DIAGNOSIS — C50919 Malignant neoplasm of unspecified site of unspecified female breast: Secondary | ICD-10-CM

## 2023-11-04 DIAGNOSIS — Z5111 Encounter for antineoplastic chemotherapy: Secondary | ICD-10-CM | POA: Diagnosis not present

## 2023-11-04 DIAGNOSIS — Z170 Estrogen receptor positive status [ER+]: Secondary | ICD-10-CM | POA: Diagnosis not present

## 2023-11-04 DIAGNOSIS — D473 Essential (hemorrhagic) thrombocythemia: Secondary | ICD-10-CM | POA: Diagnosis not present

## 2023-11-04 DIAGNOSIS — C7951 Secondary malignant neoplasm of bone: Secondary | ICD-10-CM

## 2023-11-04 DIAGNOSIS — C50412 Malignant neoplasm of upper-outer quadrant of left female breast: Secondary | ICD-10-CM | POA: Diagnosis not present

## 2023-11-04 MED ORDER — ABEMACICLIB 100 MG PO TABS
100.0000 mg | ORAL_TABLET | Freq: Two times a day (BID) | ORAL | 0 refills | Status: DC
  Filled 2023-11-05: qty 56, 28d supply, fill #0

## 2023-11-04 NOTE — Telephone Encounter (Signed)
-----   Message from Lonna Cobb sent at 11/03/2023  8:58 AM EST ----- Please call pathology and confirm we requested the breast prognostic profile on the liver biopsy from last week

## 2023-11-04 NOTE — Telephone Encounter (Signed)
Left message for patient to call back to schedule consult per 1/28 referral.

## 2023-11-04 NOTE — Telephone Encounter (Signed)
I spoke with Leta Jungling from [facility] about confirm the breast prognostic profile on the liver. The result had been upload and fax over.

## 2023-11-04 NOTE — Progress Notes (Signed)
Call [facility] and added on ESR 1 and PIK 3 mutation testing to accession number [id number]

## 2023-11-04 NOTE — Progress Notes (Signed)
[facility]cer [facility] OFFICE PROGRESS NOTE   Diagnosis: Thrombocytosis, breast cancer  INTERVAL HISTORY:   [name] returns as scheduled.  She underwent biopsy of a liver lesion 10/30/2023.  She is seen today to review the pathology report and discuss treatment options.  She continues to have back pain.  The pain is controlled as long as she is not standing.  She continues oxycodone as needed.  No constipation.  No nausea.  Objective:  Vital signs in last 24 hours:  Blood pressure (!) 166/71, pulse 84, temperature 98.2 F (36.8 C), temperature source Temporal, resp. rate 18, height 5\' 6"  (1.676 m), weight 112 lb (50.8 kg), last menstrual period 10/07/1974, SpO2 100%.    HEENT: No thrush or ulcers. Resp: Lungs clear bilaterally. Cardio: Regular rate and rhythm. GI: No hepatosplenomegaly. Vascular: Trace bilateral ankle edema. Neuro: Alert and oriented. Skin: No rash. Musculoskeletal: Tender over the lower thoracic region.  Lab Results:  Lab Results  Component Value Date   WBC 25.4 (H) 10/30/2023   HGB 13.4 10/30/2023   HCT 39.2 10/30/2023   MCV 97.5 10/30/2023   PLT 353 10/30/2023   NEUTROABS 9.5 (H) 10/16/2023    Imaging:  No results found.  Medications: I have reviewed the patient's current medications.  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  Status post a needle localized lumpectomy and sentinel lymph node biopsy 04/13/2009 with the pathology confirming high-grade  ductal carcinoma in situ with an associated 0.26 mm invasive carcinoma, ER positive, PR positive, and HER2 positive. Status post adjuvant right breast radiation completed 06/19/2009. Initiation of Arimidex 05/01/2009.  Completed 5 years in July 2015 Family history of breast cancer. History of Clostridium difficile colitis. Frequent bowel movements following the bowel resection in 2009. 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 CTs 10/16/2023-innumerable lung nodules both lungs; multiple new ill-defined low-density liver lesions; bony metastatic disease in the thoracic and lumbar spine.  Severe compression fracture at T11 with osseous retropulsion.  Small right greater than left pleural effusions. Biopsy liver lesion 10/30/2023-metastatic moderate to poorly differentiated adenocarcinoma consistent with breast origin; HER2 negative, ER +100%, PR +30%, Ki-67 60% Elevated platelet count-essential thrombocytosis 07/31/22- postitive [id number] 03/05/2023-hydroxyurea 500 mg Monday, Wednesday, and Friday CHEK2 pathogenic mutation positive Severe back pain-MRI 10/09/2023 with compression fracture at T11, abnormal signal T9, T10, T11 and T12 as well as several adjacent ribs; right greater than left pleural effusion    Disposition: [name] appears to have metastatic breast cancer involving bone, lungs, liver.  We reviewed the CT report/images and pathology report from the liver biopsy with her and her daughter at today's visit.  They  understand no therapy will be curative.  She will discontinue anastrozole.  She agrees with Dr. Kalman Drape recommendation for Faslodex and Verzenio.  We reviewed potential side effects associated with Faslodex including but not limited to abdominal pain, constipation, diarrhea, nausea, mouth sores, increased liver enzymes, pain at the injection site, arthralgias, hair loss.  She was provided with printed information as well.  We reviewed potential side effects associated with Verzenio including but not limited to bone marrow toxicity, nausea, diarrhea, rash, hair loss, pneumonitis.  She was provided with printed information.  She agrees to proceed.  We had preliminary discussion about Zometa.  She will arrange for dental clearance.  Referral made to Dr. Basilio Cairo to consider palliative radiation to the lower thoracic spine.  We discussed end-of-life issues/CODE STATUS.  She would like to be placed on No Code Blue status.  She will return for the first Faslodex injection 11/07/2023.  We will see her in follow-up on 11/21/2023.  Patient seen with Dr. Truett Perna.  Lonna Cobb ANP/GNP-BC   11/04/2023  11:29 AM This was a shared visit with Lonna Cobb.  [name] was interviewed and examined.  We reviewed the staging CT findings and images with her.  We discussed the liver biopsy result.  She has been diagnosed with metastatic cancer.  She understands no therapy will be curative.  The goals of treatment are to palliate symptoms and extend survival.  I will contact Dr. Basilio Cairo to discuss palliative radiation to the thoracic spine.  She appears to be symptomatic from pain involving a severe T11 compression fracture.  We can also consider a referral to interventional radiology for a vertebroplasty.  We are waiting on results from additional tissue testing including ESR 1 and PIK 3 mutations.  I recommend beginning treatment with Faslodex and Abemaciclib.  We reviewed potential toxicities associated with  this regimen including the chance of hematologic toxicity.  She agrees to proceed.  She will return to begin Faslodex later this week.  I was present for greater than 50% of today's visit.  I performed medical decision making.  Mancel Bale, MD

## 2023-11-05 ENCOUNTER — Other Ambulatory Visit (HOSPITAL_COMMUNITY): Payer: Self-pay

## 2023-11-05 ENCOUNTER — Encounter: Payer: Self-pay | Admitting: Oncology

## 2023-11-05 ENCOUNTER — Telehealth: Payer: Self-pay | Admitting: Pharmacist

## 2023-11-05 ENCOUNTER — Other Ambulatory Visit: Payer: Self-pay | Admitting: Oncology

## 2023-11-05 ENCOUNTER — Other Ambulatory Visit: Payer: Self-pay

## 2023-11-05 ENCOUNTER — Ambulatory Visit (HOSPITAL_COMMUNITY): Payer: Medicare Other

## 2023-11-05 ENCOUNTER — Telehealth: Payer: Self-pay

## 2023-11-05 ENCOUNTER — Telehealth: Payer: Self-pay | Admitting: *Deleted

## 2023-11-05 NOTE — Progress Notes (Signed)
Patient education documented in EPIC note on 11/05/23.

## 2023-11-05 NOTE — Progress Notes (Signed)
Specialty Pharmacy Initial Fill Coordination Note  [name] is a 89 y.o. female contacted today regarding initial fill of specialty medication(s) Abemaciclib Kathlen Mody)  Patient requested Delivery   Delivery date: 11/07/23   Verified address: [address] [place], [place] [address]  Medication will be filled on 11/06/23.   Patient is aware of $43.00 copayment. CC info on file.    Ardeen Fillers, CPhT Oncology Pharmacy Patient Advocate  [facility]  [phone] (phone) [phone] (fax) 11/05/2023 11:48 AM

## 2023-11-05 NOTE — Telephone Encounter (Signed)
[name] left message that she may have received a call from radiation oncology but she is having issue with her phone. Asking who she would be seeing? Called her back and left VM that her appointment is on 2/4 at 0930 and she will see nurse and then Dr. Lonie Peak. Arrive at 0915 to check in. Provided her phone # [phone] and ask for radiation oncology department.

## 2023-11-05 NOTE — Telephone Encounter (Signed)
Clinical Pharmacist Practitioner Encounter   Received new prescription for Verzenio (abemaciclib) for the treatment of metastatic breast cancer in conjunction with fulvestrant, planned duration until disease progression or unacceptable drug toxicity.  CMP from 10/16/23 assessed, no relevant lab abnormalities. Prescription dose and frequency assessed.   Current medication list in Epic reviewed, no DDIs with abemaciclib identified.  Evaluated chart and no patient barriers to medication adherence identified.   Prescription has been e-scribed to the [facility] for benefits analysis and approval.  Oral Oncology Clinic will continue to follow for insurance authorization, copayment issues, initial counseling and start date.  Patient agreed to treatment on 11/04/23 per MD documentation.  Remi Haggard, PharmD, BCOP, CPP Hematology/Oncology Clinical Pharmacist [facility] Oral Chemotherapy Navigation [facility] [phone]  11/05/2023 9:20 AM

## 2023-11-05 NOTE — Telephone Encounter (Signed)
Clinical Pharmacist Practitioner Encounter   [facility] (Specialty) will deliver medication to patient on 11/07/23. Patient will get started when she has medication in hand.  Patient Education I spoke with patient for overview of new oral chemotherapy medication: Verzenio (abemaciclib) for the treatment of metastatic breast cancer in conjunction with fulvestrant, planned duration until disease progression or unacceptable drug toxicity.   Counseled patient on administration, dosing, side effects, monitoring, drug-food interactions, safe handling, storage, and disposal. Patient will take 1 tablet (100 mg total) by mouth 2 (two) times daily.   Side effects include but not limited to: diarrhea, nausea, fatigue, decreased wbc/hgb/plt.   Diarrhea: patient will pick up loperamide to use as needed, she knows to call the office if she is having 4 or more loose stools Nausea: offered patient the option of having an antiemetic in advance or calling the office later if she would like something for nausea. Patient would like of hold off and reach out to the office if she needs something for nausea  Reviewed with patient importance of keeping a medication schedule and plan for any missed doses.  After discussion with patient no patient barriers to medication adherence identified.   Ms. Morgan voiced understanding and appreciation. All questions answered. Medication handout provided.  Provided patient with Oral Chemotherapy Navigation Clinic phone number. Patient knows to call the office with questions or concerns. Oral Chemotherapy Navigation Clinic will continue to follow.  Remi Haggard, PharmD, BCOP, CPP Hematology/Oncology Clinical Pharmacist [facility] Oral Chemotherapy Navigation [facility] [phone]  11/05/2023 11:35 AM

## 2023-11-05 NOTE — Telephone Encounter (Signed)
Oral Oncology Patient Advocate Encounter  New authorization   Received notification that prior authorization for Verzenio is required.   PA submitted on 11/05/23  Key [id number]  Status is pending     Ardeen Fillers, CPhT Oncology Pharmacy Patient Advocate  [facility]  [phone] (phone) [phone] (fax) 11/05/2023 9:01 AM

## 2023-11-05 NOTE — Telephone Encounter (Signed)
Patient successfully OnBoarded and drug education provided by pharmacist. Medication scheduled to be shipped on Thursday, 11/06/23, for delivery on Friday, 11/07/23, from [facility] to patient's address. Patient also knows to call me at [phone] with any questions or concerns regarding receiving medication or if there is any unexpected change in co-pay.    Ardeen Fillers, CPhT Oncology Pharmacy Patient Advocate  [facility]  [phone] (phone) [phone] (fax) 11/05/2023 11:54 AM

## 2023-11-05 NOTE — Telephone Encounter (Signed)
Oral Oncology Patient Advocate Encounter  Prior Authorization for [name] has been approved.    PA# [id number]  Effective dates: 10/06/23 through 10/06/98   [employer] was originally rejecting claim for Cost Exceeds Maximum. I called [employer] and had a Cost Exceeds Maximum Override put in place.   Override# [id number]  Effective dates: 11/04/23 - 11/03/24  Patients co-pay is $43.00.   Ardeen Fillers, CPhT Oncology Pharmacy Patient Advocate  [facility]  [phone] (phone) [phone] (fax) 11/05/2023 11:27 AM

## 2023-11-06 ENCOUNTER — Other Ambulatory Visit: Payer: Self-pay

## 2023-11-06 NOTE — Progress Notes (Addendum)
 Histology and Location of Primary Cancer:    Sites of Visceral and Bony Metastatic Disease:  MRI T-Spine w/o Contrast 10/09/2023 IMPRESSION: 1. At least 80% vertebral body height loss anteriorly at T11, with bowing of the posterior cortex, which extends approximately 6 mm posteriorly, resulting in moderate spinal canal stenosis. Abnormal signal extends into the posterior elements at T9, T10, T11, and T12, as well as into several of the adjacent ribs. Findings are concerning for metastatic disease. Recommend further evaluation with a contrast-enhanced MRI of the thoracic spine. 2. Severe bilateral neural foraminal narrowing at T10-T11 and T11-T12. 3. Right greater than left pleural effusion.  Debby Planas NP 11/04/2023  Location(s) of Symptomatic Metastases:  Mid/lower back? Patient began experiencing lower back pain on September 16, 2023. She had an MRI and was sent to oncologist. Had a CT scan.  Past/Anticipated chemotherapy by medical oncology, if any:  Debby Planas NP  11/04/2023 Palliative radiation to lower thoracic spine.    Pain on a scale of 0-10 is:   Patient says her back pain is severe that is worse anytime when she moves. She says current pain management regimen hasn't been helping her. Patient states it is very painful to walk to the bathroom and kitchen. Pain has been limiting patients activities of daily living.   If Spine Met(s), symptoms, if any, include: Bowel/Bladder retention or incontinence (please describe): Patient says she experiences bowel incontinence more than bladder. Numbness or weakness in extremities (please describe): None  Current Decadron  regimen, if applicable: Takes 4mg  PO daily  Ambulatory status? Walker? Wheelchair?: Patient says she doesn't use a walker or wheelchair.   SAFETY ISSUES: Prior radiation? Yes. Status post adjuvant radiation to right breast completed on 06/19/2009. Pacemaker/ICD? None Possible current pregnancy? N/A Is the  patient on methotrexate? None  Current Complaints / other details: None

## 2023-11-07 ENCOUNTER — Telehealth: Payer: Self-pay

## 2023-11-07 ENCOUNTER — Inpatient Hospital Stay: Payer: Medicare Other

## 2023-11-07 ENCOUNTER — Other Ambulatory Visit: Payer: Self-pay | Admitting: Nurse Practitioner

## 2023-11-07 VITALS — BP 168/48 | HR 78 | Temp 97.9°F | Resp 18

## 2023-11-07 DIAGNOSIS — E876 Hypokalemia: Secondary | ICD-10-CM

## 2023-11-07 DIAGNOSIS — C7951 Secondary malignant neoplasm of bone: Secondary | ICD-10-CM | POA: Diagnosis not present

## 2023-11-07 DIAGNOSIS — Z170 Estrogen receptor positive status [ER+]: Secondary | ICD-10-CM | POA: Diagnosis not present

## 2023-11-07 DIAGNOSIS — C50412 Malignant neoplasm of upper-outer quadrant of left female breast: Secondary | ICD-10-CM | POA: Diagnosis not present

## 2023-11-07 DIAGNOSIS — C50919 Malignant neoplasm of unspecified site of unspecified female breast: Secondary | ICD-10-CM

## 2023-11-07 DIAGNOSIS — Z5111 Encounter for antineoplastic chemotherapy: Secondary | ICD-10-CM | POA: Diagnosis not present

## 2023-11-07 DIAGNOSIS — D473 Essential (hemorrhagic) thrombocythemia: Secondary | ICD-10-CM | POA: Diagnosis not present

## 2023-11-07 DIAGNOSIS — C787 Secondary malignant neoplasm of liver and intrahepatic bile duct: Secondary | ICD-10-CM | POA: Diagnosis not present

## 2023-11-07 LAB — CBC WITH DIFFERENTIAL (CANCER CENTER ONLY)
Abs Immature Granulocytes: 0.18 10*3/uL — ABNORMAL HIGH (ref 0.00–0.07)
Basophils Absolute: 0 10*3/uL (ref 0.0–0.1)
Basophils Relative: 0 %
Eosinophils Absolute: 0 10*3/uL (ref 0.0–0.5)
Eosinophils Relative: 0 %
HCT: 35.3 % — ABNORMAL LOW (ref 36.0–46.0)
Hemoglobin: 12.3 g/dL (ref 12.0–15.0)
Immature Granulocytes: 1 %
Lymphocytes Relative: 2 %
Lymphs Abs: 0.4 10*3/uL — ABNORMAL LOW (ref 0.7–4.0)
MCH: 33.8 pg (ref 26.0–34.0)
MCHC: 34.8 g/dL (ref 30.0–36.0)
MCV: 97 fL (ref 80.0–100.0)
Monocytes Absolute: 0.2 10*3/uL (ref 0.1–1.0)
Monocytes Relative: 1 %
Neutro Abs: 19.2 10*3/uL — ABNORMAL HIGH (ref 1.7–7.7)
Neutrophils Relative %: 96 %
Platelet Count: 249 10*3/uL (ref 150–400)
RBC: 3.64 MIL/uL — ABNORMAL LOW (ref 3.87–5.11)
RDW: 14.3 % (ref 11.5–15.5)
WBC Count: 20.1 10*3/uL — ABNORMAL HIGH (ref 4.0–10.5)
nRBC: 0 % (ref 0.0–0.2)

## 2023-11-07 LAB — CMP (CANCER CENTER ONLY)
ALT: 96 U/L — ABNORMAL HIGH (ref 0–44)
AST: 24 U/L (ref 15–41)
Albumin: 3 g/dL — ABNORMAL LOW (ref 3.5–5.0)
Alkaline Phosphatase: 98 U/L (ref 38–126)
Anion gap: 10 (ref 5–15)
BUN: 36 mg/dL — ABNORMAL HIGH (ref 8–23)
CO2: 27 mmol/L (ref 22–32)
Calcium: 7.8 mg/dL — ABNORMAL LOW (ref 8.9–10.3)
Chloride: 104 mmol/L (ref 98–111)
Creatinine: 1.39 mg/dL — ABNORMAL HIGH (ref 0.44–1.00)
GFR, Estimated: 37 mL/min — ABNORMAL LOW (ref >60)
Glucose, Bld: 290 mg/dL — ABNORMAL HIGH (ref 70–99)
Potassium: 2.9 mmol/L — ABNORMAL LOW (ref 3.5–5.1)
Sodium: 141 mmol/L (ref 135–145)
Total Bilirubin: 0.8 mg/dL (ref 0.0–1.2)
Total Protein: 5 g/dL — ABNORMAL LOW (ref 6.5–8.1)

## 2023-11-07 MED ORDER — POTASSIUM CHLORIDE CRYS ER 10 MEQ PO TBCR: EXTENDED_RELEASE_TABLET | ORAL | 1 refills | Status: DC

## 2023-11-07 MED ORDER — FULVESTRANT 250 MG/5ML IM SOSY
500.0000 mg | PREFILLED_SYRINGE | Freq: Once | INTRAMUSCULAR | Status: AC
  Administered 2023-11-07: 500 mg via INTRAMUSCULAR
  Filled 2023-11-07: qty 10

## 2023-11-07 NOTE — Telephone Encounter (Addendum)
-----   Message from Lonna Cobb sent at 11/07/2023  3:14 PM EST ----- Please let her know the potassium level is low.  I am sending a prescription for potassium to her pharmacy.

## 2023-11-07 NOTE — Telephone Encounter (Signed)
Spoke with sister Warnell Bureau, stated would be able to pick prescription up Monday, would make patient aware. No further questions.

## 2023-11-07 NOTE — Patient Instructions (Signed)
 Fulvestrant Injection What is this medication? FULVESTRANT (ful VES trant) treats breast cancer. It works by blocking the hormone estrogen in breast tissue, which prevents breast cancer cells from spreading or growing. This medicine may be used for other purposes; ask your health care provider or pharmacist if you have questions. COMMON BRAND NAME(S): FASLODEX What should I tell my care team before I take this medication? They need to know if you have any of these conditions: Bleeding disorder Liver disease Low blood cell levels (white cells, red cells, and platelets) An unusual or allergic reaction to fulvestrant, other medications, foods, dyes, or preservatives Pregnant or trying to get pregnant Breastfeeding How should I use this medication? This medication is injected into a muscle. It is given by your care team in a hospital or clinic setting. Talk to your care team about the use of this medication in children. Special care may be needed. Overdosage: If you think you have taken too much of this medicine contact a poison control center or emergency room at once. NOTE: This medicine is only for you. Do not share this medicine with others. What if I miss a dose? Keep appointments for follow-up doses. It is important not to miss your dose. Call your care team if you are unable to keep an appointment. What may interact with this medication? Fluoroestradiol F18 This list may not describe all possible interactions. Give your health care provider a list of all the medicines, herbs, non-prescription drugs, or dietary supplements you use. Also tell them if you smoke, drink alcohol, or use illegal drugs. Some items may interact with your medicine. What should I watch for while using this medication? Your condition will be monitored carefully while you are receiving this medication. You may need blood work done while you are taking this medication. This medication is injected into a muscle. Talk  to your care team if you also take medications that prevent or treat blood clots, such as warfarin. Blood thinners may increase the risk of bleeding or bruising in the muscle where this medication is injected. The benefits of this medication may outweigh the risks. Your care team can help you find the option that works for you. They can also help limit the risk of bleeding. Talk to your care team if you may be pregnant. Serious birth defects can occur if you take this medication during pregnancy and for 1 year after the last dose. You will need a negative pregnancy test before starting this medication. Contraception is recommended while taking this medication and for 1 year after the last dose. Your care team can help you find the option that works for you. Do not breastfeed while taking this medication and for 1 year after the last dose. This medication may cause infertility. Talk to your care team if you are concerned about your fertility. What side effects may I notice from receiving this medication? Side effects that you should report to your care team as soon as possible: Allergic reactions or angioedema--skin rash, itching or hives, swelling of the face, eyes, lips, tongue, arms, or legs, trouble swallowing or breathing Pain, tingling, or numbness in the hands or feet Side effects that usually do not require medical attention (report to your care team if they continue or are bothersome): Bone, joint, or muscle pain Constipation Headache Hot flashes Nausea Pain, redness, or irritation at injection site Unusual weakness or fatigue This list may not describe all possible side effects. Call your doctor for medical advice about side  effects. You may report side effects to FDA at 1-800-FDA-1088. Where should I keep my medication? This medication is given in a hospital or clinic. It will not be stored at home. NOTE: This sheet is a summary. It may not cover all possible information. If you have  questions about this medicine, talk to your doctor, pharmacist, or health care provider.  2024 Elsevier/Gold Standard (2023-05-30 00:00:00)

## 2023-11-10 NOTE — Progress Notes (Signed)
 Radiation Oncology         [phone] ________________________________  Initial outpatient Consultation  Name: [name] MRN: [id number]  Date: 11/11/2023  DOB: November 13, 1933  RR:Cjmjijmjgjw, [name], MD  Alison Arley NOVAK, MD   REFERRING PHYSICIAN: [name], MD  DIAGNOSIS: C79.51   ICD-10-CM   1. Carcinoma of breast metastatic to bone, unspecified laterality (HCC)  C50.919 oxyCODONE  (OXY IR/ROXICODONE ) 5 MG immediate release tablet   C79.51 Amb Referral to Palliative Care       Cancer Staging  Carcinoma of upper-outer quadrant of left breast in female, estrogen receptor positive (HCC) Staging form: Breast, AJCC 8th Edition - Clinical stage from 05/17/2019: Stage IA (cT1c, cN0, cM0, G3, ER+, PR+, HER2-) - Signed by Izell Domino, MD on 05/19/2019 Stage prefix: Initial diagnosis Histologic grading system: 3 grade system   CHIEF COMPLAINT: Here to discuss management of metastatic spinal lesions from breast primary.   HISTORY OF PRESENT ILLNESS:[name] is a 89 y.o. 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 She was last seen in office in October of 2020 when we treated her with radiation for her left breast cancer. Patient returns today to discuss management of metastatic disease.      Patient presented to Dr. Varadarajan on 09-16-23 for chronic back pain. As there was no bowel or bladder dysfunction contributing to her back pain, she presented for a thoracic spine MRI on 10-09-23 which showed at least 80% vertebral body height loss anteriorly at T11, with bowing of the posterior cortex, extending approximately 6 mm at the greatest extent posteriorly, resulting in moderate spinal canal stenosis; abnormal  signal extending into the posterior elements at T9, T10, T11, and T12, as well as into several of the adjacent ribs; severe bilateral neural foraminal narrowing at T10-T11 and T11-T12 along with pleural effusion greater in right than left.   Subsequently, she scheduled a follow up with Olam Ned NP on 10-16-23 for an evaluation to review the MRI. At that time, she reported worsening back pain is worsening not relieved with Tramadol . To further assess her pain, she presented for CT of the chest, abdomen, and pelvis on 10-16-23 which showed widespread metastatic disease including innumerable pulmonary nodules in both lungs; with the largest nodules measuring 9 mm in the greatest extent located in the superior segment of the right lower lobe, a 7 mm left lower lobe nodule, and a 6 mm left lower lobe nodule; multiple new ill-defined low-density liver lesions, measuring 2.9 cm posteriorly in the dome of the right hepatic lobe; and a 2.8 cm lesion superior to the gallbladder fossa. Evidence of osseous metastatic disease in the thoracic and lumbar spine was also indicated on scan.   Based on CT results, she underwent a liver biopsy on 10-30-23 showing metastatic moderate to poorly differentiated adenocarcinoma consistent with breast origin. The metastatic adenocarcinoma is positive for cytokeratin 7, GATA3, and ER. Cytokeratin 20, PAX8, CDX2, and TTF-1 are negative.    Patient most recently followed up with Dr. Cloretta on 11/04/23. To review biopsy results and discuss further treatment plans. Upon discussion, she agreed to start Faslodex  and Verzenio  along with consideration of palliative radiation to the lower thoracic spine. Her first Faslodex  injection was on 11/07/2023.   Patient states  her worsening back pain has been present for at least 1 month. This pain is present in her lower spine and radiates to the left side of her back. She denies any lower extremity weakness, numbness, tingling, or urinary incontinence.   She is ambulatory with a walker but does report significant pain when walking so she keeps it to a minimum.  She is accompanied by her supportive friend today.    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    PAST SURGICAL HISTORY: Past Surgical History:  Procedure Laterality Date   APPENDECTOMY     BREAST LUMPECTOMY Right 04-13-2009   W/ SLN BX   BREAST LUMPECTOMY WITH RADIOACTIVE SEED LOCALIZATION Left 06/11/2019   Procedure: LEFT BREAST LUMPECTOMY WITH RADIOACTIVE SEED LOCALIZATION;  Surgeon: Curvin Deward MOULD, MD;  Location: [facility] OR;  Service: General;  Laterality: Left;   CARDIAC CATHETERIZATION  11-11-2005 DR VICTORY SHARPS   MODERATE LAD DISEASE/ NORMAL LVF/ EF 65-75%   CATARACT EXTRACTION W/ INTRAOCULAR LENS IMPLANT Bilateral 04/26/2013   CHOLECYSTECTOMY  1990   OPEN   COLONOSCOPY     CYSTOSCOPY W/ URETERAL STENT PLACEMENT Bilateral 05/07/2013   Procedure: CYSTOSCOPY WITH STENT REPLACEMENTS;  Surgeon: Ricardo Likens, MD;  Location: [facility];  Service: Urology;  Laterality: Bilateral;    CYSTOSCOPY WITH BIOPSY N/A 04/07/2013   Procedure: CYSTOSCOPY WITH BIOPSY and fulgeration;  Surgeon: Ricardo Likens, MD;  Location: [facility];  Service: Urology;  Laterality: N/A;   CYSTOSCOPY WITH RETROGRADE PYELOGRAM, URETEROSCOPY AND STENT PLACEMENT Bilateral 05/07/2013   Procedure: CYSTOSCOPY WITH RETROGRADE PYELOGRAM, URETEROSCOPY ;  Surgeon: Ricardo Likens, MD;  Location: [facility];  Service: Urology;  Laterality: Bilateral;   CYSTOSCOPY WITH RETROGRADE PYELOGRAM, URETEROSCOPY AND STENT PLACEMENT Bilateral 01/06/2019   Procedure: CYSTOSCOPY WITH RETROGRADE PYELOGRAM, URETEROSCOPY AND STENT PLACEMENT, FIRST STAGE;  Surgeon: Likens Ricardo, MD;  Location: [facility];  Service: Urology;  Laterality: Bilateral;   CYSTOSCOPY WITH RETROGRADE PYELOGRAM, URETEROSCOPY AND STENT PLACEMENT Bilateral 01/27/2019   Procedure: CYSTOSCOPY WITH RETROGRADE PYELOGRAM, URETEROSCOPY AND STENT PLACEMENT;  Surgeon: Likens Ricardo, MD;  Location: [facility];  Service: Urology;  Laterality: Bilateral;  75 MINS   CYSTOSCOPY WITH STENT PLACEMENT Bilateral 04/07/2013   Procedure: CYSTOSCOPY WITH STENT PLACEMENT;  Surgeon: Ricardo Likens, MD;  Location: [facility];  Service: Urology;  Laterality: Bilateral;   DX LAPAROSCOPY W/ PERITONEAL AND OMENTAL BX'S AND WASHINGS  02-16-2008   EXCISION RIGHT BREAST MASS  02-10-2002   EXP. LAP. EXTENSIVE ADHESIOLYSIS/ RESECTION TERMINAL ILEUM , ASCENDING AND DESCENDING COLON WITH CREATION ILEOSTOMY AND MUCOUS FISTULA  02-19-2008   PERFERATION AND ABD. CANCER--  TAKEDOWN ILEOSTOMY 08-23-2008   HOLMIUM LASER APPLICATION Bilateral 01/06/2019   Procedure: HOLMIUM LASER APPLICATION;  Surgeon:  Alvaro Hummer, MD;  Location: [facility];  Service: Urology;  Laterality: Bilateral;   HOLMIUM LASER APPLICATION Bilateral 01/27/2019   Procedure: HOLMIUM LASER APPLICATION;  Surgeon: Alvaro Hummer, MD;  Location: [facility];  Service: Urology;  Laterality: Bilateral;   I & D EXTREMITY Right 02/02/2022    Procedure: IRRIGATION AND DEBRIDEMENT HAND;  Surgeon: Shari Easter, MD;  Location: [facility] OR;  Service: Orthopedics;  Laterality: Right;   ILEOSTOMY CLOSURE  08/2008   TOTAL ABDOMINAL HYSTERECTOMY W/ BILATERAL SALPINGOOPHORECTOMY  1976   TOTAL ABDOMINAL HYSTERECTOMY W/ BILATERAL SALPINGOOPHORECTOMY  1976   VULVAR LESION REMOVAL N/A 08/22/2014   Procedure: EXCISION OF VULVAR CYST  ;  Surgeon: Ronal Elvie Pinal, MD;  Location: [facility];  Service: Gynecology;  Laterality: N/A;   WOUND DEBRIDEMENT  05/28/2012   Procedure: DEBRIDEMENT ABDOMINAL WOUND;  Surgeon: Sherlean JINNY Laughter, MD;  Location: [facility];  Service: General;  Laterality: N/A;  excision chronic wound abdominal wall    FAMILY HISTORY: family history includes Breast cancer in her sister; Cancer in her sister; Heart disease in her mother; Hyperlipidemia in her mother.  SOCIAL HISTORY:  reports that she has never smoked. She has never used smokeless tobacco. She reports that she does not drink alcohol and does not use drugs.  ALLERGIES: Ramipril  MEDICATIONS:  Current Outpatient Medications  Medication Sig Dispense Refill   abemaciclib  (VERZENIO ) 100 MG tablet Take 1 tablet (100 mg total) by mouth 2 (two) times daily. 56 tablet 0   amLODipine  (NORVASC ) 10 MG tablet Take 1 tablet (10 mg total) by mouth daily. (Patient taking differently: Take 10 mg by mouth daily with lunch.) 90 tablet 1   aspirin  EC 81 MG tablet Take 81 mg by mouth daily.     BELSOMRA 10 MG TABS Take 10 mg by mouth at bedtime as needed (sleep).      Calcium  Carbonate-Vitamin D  (CALTRATE 600+D PO) Take 1 tablet by mouth 2 (two) times daily.     carvedilol  (COREG ) 25 MG tablet Take 1 tablet (25 mg total) by mouth 2 (two) times daily with a meal. 180 tablet 3   chlorthalidone  (HYGROTON ) 25 MG tablet Take 0.5 tablets (12.5 mg total) by mouth daily. (Patient taking differently: Take 12.5 mg by mouth in the morning and at bedtime.) 15 tablet 11    Cholecalciferol  (VITAMIN D3) 50 MCG (2000 UT) TABS Take 2,000 Units by mouth in the morning and at bedtime.     dexamethasone  (DECADRON ) 4 MG tablet Take 1 tab (4 mg) twice a day for 3 days then take 1 tab daily 50 tablet 0   hydrALAZINE  (APRESOLINE ) 10 MG tablet Take 1 tablet (10 mg total) by mouth in the morning and at bedtime. (Patient taking differently: Take 25 mg by mouth 3 (three) times daily.) 60 tablet 11   hydroxychloroquine  (PLAQUENIL ) 200 MG tablet Take 200 mg by mouth daily.     hydroxyurea  (HYDREA ) 500 MG capsule TAKE 1 CAPSULE BY MOUTH EVERY MONDAY, WEDNESDAY, AND FRIDAY. MAY TAKE WITH FOOD TO MINIMIZE GI SIDE EFFECTS 36 capsule 0   isosorbide  mononitrate (IMDUR ) 60 MG 24 hr tablet Take 1 tablet (60 mg total) by mouth daily. 90 tablet 2   oxyCODONE  (OXY IR/ROXICODONE ) 5 MG immediate release tablet Take 2 tablets (10 mg total) by mouth every 4 (four) hours as needed for up to 14 days for severe pain (pain score 7-10). 30 tablet 0   potassium chloride  (KLOR-CON  M) 10 MEQ tablet Take 1 tablet (  10 meq) twice daily for 3 days, then 1 tablet daily. 60 tablet 1   PROCTOZONE -HC 2.5 % rectal cream PLACE 1 APPLICATION RECTALLY 2 TIMES A DAY AS NEEDED FOR HEMORRHOIDS. (Patient taking differently: Place 1 application  rectally 2 (two) times daily as needed for hemorrhoids.) 60 g 11   rosuvastatin  (CRESTOR ) 10 MG tablet Take 1 tablet by mouth every other day.     triamcinolone  (KENALOG) 0.1 % Apply 1 application. topically as needed (rash).     No current facility-administered medications for this encounter.    REVIEW OF SYSTEMS:  Notable for that above.   PHYSICAL EXAM:  height is 5' 6 (1.676 m) and weight is 106 lb 8 oz (48.3 kg). Her temporal temperature is 97.3 F (36.3 C) (abnormal). Her blood pressure is 127/51 (abnormal) and her pulse is 81. Her respiration is 18 and oxygen saturation is 97%.   In general, this is a thin, well appearing female in significant pain. She's alert and  oriented x4 and appropriate throughout the examination. Cardiopulmonary assessment is negative for acute distress and she exhibits normal effort.     Musculoskeletal: No tenderness along the thoracic protuberance of her spine. Exquisite tenderness to palpation of the lower lumbar spine.    Neurologic: Cranial nerves II through XII are grossly intact. No obvious focalities. Speech is fluent. Coordination is intact.  She denies numbness in her lower extremities.  She is able to lift her legs against gravity bilaterally. Psychiatric: Judgment and insight are intact. Affect is appropriate. Skin: No concerning lesions over her back Ext: No edema  ECOG = 3  0 - Asymptomatic (Fully active, able to carry on all predisease activities without restriction)  1 - Symptomatic but completely ambulatory (Restricted in physically strenuous activity but ambulatory and able to carry out work of a light or sedentary nature. For example, light housework, office work)  2 - Symptomatic, <50% in bed during the day (Ambulatory and capable of all self care but unable to carry out any work activities. Up and about more than 50% of waking hours)  3 - Symptomatic, >50% in bed, but not bedbound (Capable of only limited self-care, confined to bed or chair 50% or more of waking hours)  4 - Bedbound (Completely disabled. Cannot carry on any self-care. Totally confined to bed or chair)  5 - Death   Raylene MM, Creech RH, Tormey DC, et al. [phone]). Toxicity and response criteria of the [facility]. Am. DOROTHA Bridges. Oncol. 5 (6): 649-55   LABORATORY DATA:  Lab Results  Component Value Date   WBC 20.1 (H) 11/07/2023   HGB 12.3 11/07/2023   HCT 35.3 (L) 11/07/2023   MCV 97.0 11/07/2023   PLT 249 11/07/2023   CMP     Component Value Date/Time   NA 141 11/07/2023 1413   NA 142 05/04/2021 1053   K 2.9 (L) 11/07/2023 1413   CL 104 11/07/2023 1413   CO2 27 11/07/2023 1413   GLUCOSE 290 (H) 11/07/2023  1413   BUN 36 (H) 11/07/2023 1413   BUN 35 (H) 05/04/2021 1053   CREATININE 1.39 (H) 11/07/2023 1413   CREATININE 1.44 (H) 08/01/2016 0924   CALCIUM  7.8 (L) 11/07/2023 1413   PROT 5.0 (L) 11/07/2023 1413   ALBUMIN  3.0 (L) 11/07/2023 1413   AST 24 11/07/2023 1413   ALT 96 (H) 11/07/2023 1413   ALKPHOS 98 11/07/2023 1413   BILITOT 0.8 11/07/2023 1413   EGFR 39 (L) 05/04/2021 1053  GFRNONAA 37 (L) 11/07/2023 1413         RADIOGRAPHY: US  BIOPSY (LIVER) Result Date: 10/30/2023 INDICATION: History of prior left breast carcinoma. Imaging evidence metastatic disease in the liver, multiple skeletal sites and possibly lungs. The patient presents for liver lesion biopsy. EXAM: ULTRASOUND GUIDED CORE BIOPSY OF LIVER MEDICATIONS: None. ANESTHESIA/SEDATION: Moderate (conscious) sedation was employed during this procedure. A total of Versed  1.0 mg and Fentanyl  50 mcg was administered intravenously. Moderate Sedation Time: 15 minutes. The patient's level of consciousness and vital signs were monitored continuously by radiology nursing throughout the procedure under my direct supervision. PROCEDURE: The procedure, risks, benefits, and alternatives were explained to the patient. Questions regarding the procedure were encouraged and answered. The patient understands and consents to the procedure. A time-out was performed prior to initiating the procedure. The abdominal wall was prepped with exiting in a sterile fashion, and a sterile drape was applied covering the operative field. A sterile gown and sterile gloves were used for the procedure. Local anesthesia was provided with 1% Lidocaine . Ultrasound was performed to localize liver lesions. A 17 gauge trocar needle was advanced into the liver at the level of a lesion within the right lobe. Three separate coaxial 18 gauge core biopsy samples were obtained and submitted in formalin. Additional ultrasound was performed after removal of the outer needle.  COMPLICATIONS: None immediate. FINDINGS: Multiple rounded hypoechoic mass lesions are seen throughout the liver parenchyma. A lesion within the medial right lobe was chosen for sampling measuring approximately 2.5 x 2.2 x 2.5 cm. Solid tissue was obtained. IMPRESSION: Ultrasound-guided core biopsy performed of a mass lesion within the right lobe of the liver measuring 2.5 cm in maximum diameter. Electronically Signed   By: Marcey Moan M.D.   On: 10/30/2023 11:42   CT CHEST ABDOMEN PELVIS WO CONTRAST Result Date: 10/16/2023 CLINICAL DATA:  Severe mid to low back pain. History of bilateral breast cancer with radiation and chemotherapy. History of ovarian cancer. * Tracking Code: BO * EXAM: CT CHEST, ABDOMEN AND PELVIS WITHOUT CONTRAST TECHNIQUE: Multidetector CT imaging of the chest, abdomen and pelvis was performed following the standard protocol without IV contrast. RADIATION DOSE REDUCTION: This exam was performed according to the departmental dose-optimization program which includes automated exposure control, adjustment of the mA and/or kV according to patient size and/or use of iterative reconstruction technique. COMPARISON:  Thoracic MRI 10/09/2023.  Abdominopelvic CT 12/31/2018. FINDINGS: CT CHEST FINDINGS Cardiovascular: Diffuse atherosclerosis of the aorta, great vessels and coronary arteries. Small pericardial effusion. The heart size is normal. Mediastinum/Nodes: There are no enlarged mediastinal, hilar, internal mammary or axillary lymph nodes. Hilar assessment is limited by the lack of intravenous contrast. The thyroid  gland, trachea and esophagus demonstrate no significant findings. Lungs/Pleura: Small right-greater-than-left pleural effusions without apparent nodularity on noncontrast imaging. Innumerable pulmonary nodules in both lungs, highly suspicious for metastatic disease. The nodules at the lung bases are new compared with the previous abdominal CT. Largest nodules include a 9 mm nodule in  the superior segment of the right lower lobe on image 54/4, a 7 mm left lower lobe nodule on image 45/4 and a 6 mm left lower lobe nodule on image 105/4. Mild dependent opacities in both lung bases, likely atelectasis related to the pleural effusions. Musculoskeletal/Chest wall: As seen on recent thoracic MRI, there is a severe compression fracture at T11 with osseous retropulsion. There are moderate lucencies throughout the T10, T11 and T12 vertebral bodies, suspicious for osseous metastatic disease. Probable involvement of  the sternum. CT ABDOMEN AND PELVIS FINDINGS Hepatobiliary: There are multiple new ill-defined low-density liver lesions consistent with widespread metastatic disease. Representative lesions include a 2.9 cm lesion posteriorly in the dome of the right hepatic lobe on image 45/2 and a 2.8 cm lesion superior to the gallbladder fossa on image 53/2. Status post cholecystectomy without evidence of biliary dilatation. Pancreas: Atrophy. No focal abnormality or surrounding inflammation identified. Spleen: Normal in size without focal abnormality. Adrenals/Urinary Tract: Unchanged 2.0 cm right adrenal nodule on image 57/2. Although this has a nonspecific density, this is consistent with an adenoma based on stability from previous CT. The left adrenal gland appears normal. Nonobstructing bilateral renal calculi. No evidence of ureteral calculus or hydronephrosis. The bladder appears normal for its degree of distention. Stomach/Bowel: No enteric contrast administered. The stomach appears unremarkable for its degree of distension. No evidence of bowel wall thickening, distention or surrounding inflammatory change. Previous right hemicolectomy. Prominent stool in the distal colon. Vascular/Lymphatic: No enlarged abdominal or pelvic lymph nodes are identified on this noncontrast study. Diffuse aortic and branch vessel atherosclerosis without evidence of aneurysm. Reproductive: Hysterectomy.  No evidence of  adnexal mass. Other: Postsurgical changes in the anterior abdominal wall. No ascites, peritoneal nodularity or pneumoperitoneum. Musculoskeletal: Evidence for metastatic disease within the lumbar spine, most obvious within the posterior elements at L4. No pathologic fractures are identified in the lumbar spine. There is a degenerative anterolisthesis at L5-S1 associated with chronic foraminal narrowing bilaterally. IMPRESSION: 1. Findings are consistent with widespread metastatic disease, likely from the patient's breast cancer. Consider PET-CT for further staging. 2. Innumerable pulmonary nodules in both lungs, highly suspicious for metastatic disease. 3. Multiple new ill-defined low-density liver lesions consistent with metastatic disease. 4. Evidence of osseous metastatic disease in the thoracic and lumbar spine. Severe compression fracture at T11 with osseous retropulsion, better demonstrated on recent thoracic MRI. 5. Small right-greater-than-left pleural effusions with associated bibasilar atelectasis. 6. Nonobstructing bilateral renal calculi. 7. Stable right adrenal nodule, likely an adenoma. 8.  Aortic Atherosclerosis (ICD10-I70.0). Electronically Signed   By: Elsie Perone M.D.   On: 10/16/2023 14:35      IMPRESSION/PLAN:   Spinal metastases from stage IV breast cancer.  We have reviewed the patient's case and pertinent imaging. MRI of the spine shows at least 80% vertebral body height loss anteriorly at T11 with bowing of the posterior cortex and spinal cord displacement. Fracture looks chronic per discussion w/ radiology.  This area is not causing neurologic deficits, fortunately.  Her pain is actually associated with the low lumbar region, not lower T spine. She is not a candidate for either kyphoplasty or surgical decompression following discussions with both interventional radiology and spinal surgery ([facility]) today. Palliative radiation to the lower thoracic, lumbar, and upper sacral spine may  help alleviate some of her pain.   Today, we talked to the patient and family about the findings and work-up thus far.  We discussed the natural history of bony metastases from breast cancer and general treatment, highlighting the role of radiotherapy in the management.  We discussed the available radiation techniques, and focused on the details of logistics and delivery.  We reviewed the anticipated acute and late sequelae associated with radiation in this setting. No guarantees of treatment were given. The patient was encouraged to ask questions that I answered to the best of my ability. A patient consent form was discussed and signed.  We retained a copy for our records. The patient would like to proceed with  radiation and is scheduled for CT simulation later today. Anticipate 30 Gy in 10 fractions to lower thoracic, lumbar, and upper sacral spine   We again look forward to participating in this patient's care.    Cancer related pain  Patient is unfortunately experiencing a significant amount of pain from her spinal disease that has been unresponsive to her current medication regimen. I have increased her oxycodone  to 10 mg q4h PRN. Urgent referral made to palliative care to help assist with this today.    On date of service, in total, I spent 60 minutes on this encounter. Patient was seen in person. Note signed after encounter date; minutes pertain to date of service, only.   __________________________________________   Leeroy Due, PA-C   Lauraine Golden, MD    [facility]  Radiation Oncology Direct Dial: [phone]  Fax: [phone] [email or web address]    This document serves as a record of services personally performed by Lauraine Golden, MD and Leeroy Due, PA-C. It was created on her behalf by Reymundo Cartwright, a trained medical scribe. The creation of this record is based on the scribe's personal observations and the provider's statements to them. This document has been checked and approved  by the attending provider.

## 2023-11-11 ENCOUNTER — Ambulatory Visit
Admission: RE | Admit: 2023-11-11 | Discharge: 2023-11-11 | Disposition: A | Discharge status: Home / Self Care | Payer: Medicare Other | Source: Ambulatory Visit | Attending: Radiation Oncology | Admitting: Radiation Oncology

## 2023-11-11 ENCOUNTER — Encounter: Payer: Self-pay | Admitting: Radiation Oncology

## 2023-11-11 VITALS — BP 127/51 | HR 81 | Temp 97.3°F | Resp 18 | Ht 66.0 in | Wt 106.5 lb

## 2023-11-11 DIAGNOSIS — K219 Gastro-esophageal reflux disease without esophagitis: Secondary | ICD-10-CM | POA: Diagnosis not present

## 2023-11-11 DIAGNOSIS — R918 Other nonspecific abnormal finding of lung field: Secondary | ICD-10-CM | POA: Diagnosis not present

## 2023-11-11 DIAGNOSIS — Z79899 Other long term (current) drug therapy: Secondary | ICD-10-CM | POA: Diagnosis not present

## 2023-11-11 DIAGNOSIS — G893 Neoplasm related pain (acute) (chronic): Secondary | ICD-10-CM | POA: Diagnosis not present

## 2023-11-11 DIAGNOSIS — Z85828 Personal history of other malignant neoplasm of skin: Secondary | ICD-10-CM | POA: Diagnosis not present

## 2023-11-11 DIAGNOSIS — C7951 Secondary malignant neoplasm of bone: Secondary | ICD-10-CM | POA: Insufficient documentation

## 2023-11-11 DIAGNOSIS — C50412 Malignant neoplasm of upper-outer quadrant of left female breast: Secondary | ICD-10-CM | POA: Diagnosis not present

## 2023-11-11 DIAGNOSIS — Z8543 Personal history of malignant neoplasm of ovary: Secondary | ICD-10-CM | POA: Insufficient documentation

## 2023-11-11 DIAGNOSIS — Z803 Family history of malignant neoplasm of breast: Secondary | ICD-10-CM | POA: Diagnosis not present

## 2023-11-11 DIAGNOSIS — Z79811 Long term (current) use of aromatase inhibitors: Secondary | ICD-10-CM | POA: Insufficient documentation

## 2023-11-11 DIAGNOSIS — E785 Hyperlipidemia, unspecified: Secondary | ICD-10-CM | POA: Insufficient documentation

## 2023-11-11 DIAGNOSIS — Z170 Estrogen receptor positive status [ER+]: Secondary | ICD-10-CM | POA: Insufficient documentation

## 2023-11-11 DIAGNOSIS — I2510 Atherosclerotic heart disease of native coronary artery without angina pectoris: Secondary | ICD-10-CM | POA: Insufficient documentation

## 2023-11-11 DIAGNOSIS — C50919 Malignant neoplasm of unspecified site of unspecified female breast: Secondary | ICD-10-CM

## 2023-11-11 DIAGNOSIS — Z87442 Personal history of urinary calculi: Secondary | ICD-10-CM | POA: Insufficient documentation

## 2023-11-11 DIAGNOSIS — Z7982 Long term (current) use of aspirin: Secondary | ICD-10-CM | POA: Diagnosis not present

## 2023-11-11 DIAGNOSIS — Z7952 Long term (current) use of systemic steroids: Secondary | ICD-10-CM | POA: Diagnosis not present

## 2023-11-11 DIAGNOSIS — I10 Essential (primary) hypertension: Secondary | ICD-10-CM | POA: Diagnosis not present

## 2023-11-11 DIAGNOSIS — Z923 Personal history of irradiation: Secondary | ICD-10-CM | POA: Insufficient documentation

## 2023-11-11 MED ORDER — OXYCODONE HCL 5 MG PO TABS: 10.0000 mg | ORAL_TABLET | ORAL | 0 refills | Status: DC | PRN

## 2023-11-12 ENCOUNTER — Other Ambulatory Visit: Payer: Self-pay | Admitting: Radiology

## 2023-11-12 ENCOUNTER — Encounter: Payer: Self-pay | Admitting: Radiation Oncology

## 2023-11-12 ENCOUNTER — Other Ambulatory Visit: Payer: Self-pay | Admitting: Radiation Oncology

## 2023-11-12 DIAGNOSIS — C7951 Secondary malignant neoplasm of bone: Secondary | ICD-10-CM

## 2023-11-12 DIAGNOSIS — C50412 Malignant neoplasm of upper-outer quadrant of left female breast: Secondary | ICD-10-CM | POA: Diagnosis not present

## 2023-11-12 DIAGNOSIS — Z170 Estrogen receptor positive status [ER+]: Secondary | ICD-10-CM | POA: Diagnosis not present

## 2023-11-12 DIAGNOSIS — C50919 Malignant neoplasm of unspecified site of unspecified female breast: Secondary | ICD-10-CM | POA: Insufficient documentation

## 2023-11-12 MED ORDER — OXYCODONE HCL 5 MG PO TABS: 10.0000 mg | ORAL_TABLET | ORAL | 0 refills | Status: DC | PRN

## 2023-11-13 ENCOUNTER — Telehealth: Payer: Self-pay

## 2023-11-13 ENCOUNTER — Other Ambulatory Visit: Payer: Self-pay | Admitting: Radiation Oncology

## 2023-11-13 ENCOUNTER — Other Ambulatory Visit: Payer: Self-pay | Admitting: Radiology

## 2023-11-13 DIAGNOSIS — C7951 Secondary malignant neoplasm of bone: Secondary | ICD-10-CM

## 2023-11-13 MED ORDER — OXYCODONE HCL 5 MG PO TABS: 5.0000 mg | ORAL_TABLET | ORAL | 0 refills | Status: DC | PRN

## 2023-11-13 NOTE — Telephone Encounter (Signed)
 Called placed to patient to make aware that prescription for Oxycodone  was sent to [facility]. Called and verified that medication was in stock at pharmacy. Patient voiced understanding.

## 2023-11-17 ENCOUNTER — Telehealth: Payer: Self-pay

## 2023-11-17 NOTE — Telephone Encounter (Signed)
 Patient called to say she has not gotten established with Palliative Care yet who was consulted to help with pain management. She reports she is almost out of the pain medication (oxycodone ) that was prescribed by Dr. Eloise Hake on 2/6 (since Dr. Lurena Sally was out of office that day).   Informed patient I would pass along her concern to her oncology providers and someone would call her back with an update regarding appointments and pain medication. Patient verbalized understanding and appreciation.

## 2023-11-18 ENCOUNTER — Other Ambulatory Visit: Payer: Self-pay | Admitting: Nurse Practitioner

## 2023-11-18 ENCOUNTER — Other Ambulatory Visit: Payer: Self-pay | Admitting: Oncology

## 2023-11-18 ENCOUNTER — Ambulatory Visit
Admission: RE | Admit: 2023-11-18 | Discharge: 2023-11-18 | Disposition: A | Discharge status: Home / Self Care | Payer: Medicare Other | Source: Ambulatory Visit | Attending: Radiation Oncology | Admitting: Radiation Oncology

## 2023-11-18 ENCOUNTER — Telehealth: Payer: Self-pay | Admitting: *Deleted

## 2023-11-18 ENCOUNTER — Other Ambulatory Visit: Payer: Self-pay

## 2023-11-18 DIAGNOSIS — R2681 Unsteadiness on feet: Secondary | ICD-10-CM | POA: Diagnosis not present

## 2023-11-18 DIAGNOSIS — D539 Nutritional anemia, unspecified: Secondary | ICD-10-CM | POA: Diagnosis present

## 2023-11-18 DIAGNOSIS — I4892 Unspecified atrial flutter: Secondary | ICD-10-CM | POA: Diagnosis present

## 2023-11-18 DIAGNOSIS — C50912 Malignant neoplasm of unspecified site of left female breast: Secondary | ICD-10-CM | POA: Diagnosis not present

## 2023-11-18 DIAGNOSIS — R5381 Other malaise: Secondary | ICD-10-CM | POA: Diagnosis not present

## 2023-11-18 DIAGNOSIS — C569 Malignant neoplasm of unspecified ovary: Secondary | ICD-10-CM | POA: Diagnosis not present

## 2023-11-18 DIAGNOSIS — R1310 Dysphagia, unspecified: Secondary | ICD-10-CM | POA: Diagnosis not present

## 2023-11-18 DIAGNOSIS — Z85828 Personal history of other malignant neoplasm of skin: Secondary | ICD-10-CM | POA: Diagnosis not present

## 2023-11-18 DIAGNOSIS — C50911 Malignant neoplasm of unspecified site of right female breast: Secondary | ICD-10-CM | POA: Diagnosis present

## 2023-11-18 DIAGNOSIS — R531 Weakness: Secondary | ICD-10-CM | POA: Diagnosis not present

## 2023-11-18 DIAGNOSIS — Z510 Encounter for antineoplastic radiation therapy: Secondary | ICD-10-CM | POA: Diagnosis not present

## 2023-11-18 DIAGNOSIS — I4891 Unspecified atrial fibrillation: Secondary | ICD-10-CM | POA: Diagnosis not present

## 2023-11-18 DIAGNOSIS — I5032 Chronic diastolic (congestive) heart failure: Secondary | ICD-10-CM | POA: Diagnosis present

## 2023-11-18 DIAGNOSIS — M6281 Muscle weakness (generalized): Secondary | ICD-10-CM | POA: Diagnosis not present

## 2023-11-18 DIAGNOSIS — N281 Cyst of kidney, acquired: Secondary | ICD-10-CM | POA: Diagnosis not present

## 2023-11-18 DIAGNOSIS — C786 Secondary malignant neoplasm of retroperitoneum and peritoneum: Secondary | ICD-10-CM | POA: Diagnosis not present

## 2023-11-18 DIAGNOSIS — N132 Hydronephrosis with renal and ureteral calculous obstruction: Secondary | ICD-10-CM | POA: Diagnosis not present

## 2023-11-18 DIAGNOSIS — M4854XA Collapsed vertebra, not elsewhere classified, thoracic region, initial encounter for fracture: Secondary | ICD-10-CM | POA: Diagnosis present

## 2023-11-18 DIAGNOSIS — F320 Major depressive disorder, single episode, mild: Secondary | ICD-10-CM | POA: Diagnosis not present

## 2023-11-18 DIAGNOSIS — R7989 Other specified abnormal findings of blood chemistry: Secondary | ICD-10-CM | POA: Diagnosis not present

## 2023-11-18 DIAGNOSIS — R627 Adult failure to thrive: Secondary | ICD-10-CM | POA: Diagnosis not present

## 2023-11-18 DIAGNOSIS — C787 Secondary malignant neoplasm of liver and intrahepatic bile duct: Secondary | ICD-10-CM | POA: Diagnosis not present

## 2023-11-18 DIAGNOSIS — R109 Unspecified abdominal pain: Secondary | ICD-10-CM | POA: Diagnosis not present

## 2023-11-18 DIAGNOSIS — Z66 Do not resuscitate: Secondary | ICD-10-CM | POA: Diagnosis not present

## 2023-11-18 DIAGNOSIS — I130 Hypertensive heart and chronic kidney disease with heart failure and stage 1 through stage 4 chronic kidney disease, or unspecified chronic kidney disease: Secondary | ICD-10-CM | POA: Diagnosis present

## 2023-11-18 DIAGNOSIS — Z8543 Personal history of malignant neoplasm of ovary: Secondary | ICD-10-CM | POA: Diagnosis not present

## 2023-11-18 DIAGNOSIS — R079 Chest pain, unspecified: Secondary | ICD-10-CM | POA: Diagnosis not present

## 2023-11-18 DIAGNOSIS — Z79899 Other long term (current) drug therapy: Secondary | ICD-10-CM | POA: Diagnosis not present

## 2023-11-18 DIAGNOSIS — R1313 Dysphagia, pharyngeal phase: Secondary | ICD-10-CM | POA: Diagnosis not present

## 2023-11-18 DIAGNOSIS — K224 Dyskinesia of esophagus: Secondary | ICD-10-CM | POA: Diagnosis not present

## 2023-11-18 DIAGNOSIS — N189 Chronic kidney disease, unspecified: Secondary | ICD-10-CM | POA: Diagnosis not present

## 2023-11-18 DIAGNOSIS — I2489 Other forms of acute ischemic heart disease: Secondary | ICD-10-CM | POA: Diagnosis not present

## 2023-11-18 DIAGNOSIS — C7971 Secondary malignant neoplasm of right adrenal gland: Secondary | ICD-10-CM | POA: Diagnosis not present

## 2023-11-18 DIAGNOSIS — N179 Acute kidney failure, unspecified: Secondary | ICD-10-CM | POA: Diagnosis not present

## 2023-11-18 DIAGNOSIS — I422 Other hypertrophic cardiomyopathy: Secondary | ICD-10-CM | POA: Diagnosis present

## 2023-11-18 DIAGNOSIS — I480 Paroxysmal atrial fibrillation: Secondary | ICD-10-CM | POA: Diagnosis not present

## 2023-11-18 DIAGNOSIS — E43 Unspecified severe protein-calorie malnutrition: Secondary | ICD-10-CM | POA: Diagnosis not present

## 2023-11-18 DIAGNOSIS — E785 Hyperlipidemia, unspecified: Secondary | ICD-10-CM | POA: Diagnosis not present

## 2023-11-18 DIAGNOSIS — E8809 Other disorders of plasma-protein metabolism, not elsewhere classified: Secondary | ICD-10-CM | POA: Diagnosis not present

## 2023-11-18 DIAGNOSIS — D473 Essential (hemorrhagic) thrombocythemia: Secondary | ICD-10-CM | POA: Diagnosis present

## 2023-11-18 DIAGNOSIS — N170 Acute kidney failure with tubular necrosis: Secondary | ICD-10-CM | POA: Diagnosis not present

## 2023-11-18 DIAGNOSIS — C50919 Malignant neoplasm of unspecified site of unspecified female breast: Secondary | ICD-10-CM | POA: Diagnosis not present

## 2023-11-18 DIAGNOSIS — I10 Essential (primary) hypertension: Secondary | ICD-10-CM | POA: Diagnosis not present

## 2023-11-18 DIAGNOSIS — D649 Anemia, unspecified: Secondary | ICD-10-CM | POA: Diagnosis not present

## 2023-11-18 DIAGNOSIS — K219 Gastro-esophageal reflux disease without esophagitis: Secondary | ICD-10-CM | POA: Diagnosis not present

## 2023-11-18 DIAGNOSIS — C7951 Secondary malignant neoplasm of bone: Secondary | ICD-10-CM | POA: Diagnosis not present

## 2023-11-18 DIAGNOSIS — D61818 Other pancytopenia: Secondary | ICD-10-CM | POA: Diagnosis not present

## 2023-11-18 DIAGNOSIS — G2581 Restless legs syndrome: Secondary | ICD-10-CM | POA: Diagnosis not present

## 2023-11-18 DIAGNOSIS — N183 Chronic kidney disease, stage 3 unspecified: Secondary | ICD-10-CM | POA: Diagnosis present

## 2023-11-18 DIAGNOSIS — Z743 Need for continuous supervision: Secondary | ICD-10-CM | POA: Diagnosis not present

## 2023-11-18 DIAGNOSIS — I2510 Atherosclerotic heart disease of native coronary artery without angina pectoris: Secondary | ICD-10-CM | POA: Diagnosis not present

## 2023-11-18 DIAGNOSIS — C50412 Malignant neoplasm of upper-outer quadrant of left female breast: Secondary | ICD-10-CM | POA: Diagnosis not present

## 2023-11-18 DIAGNOSIS — C7989 Secondary malignant neoplasm of other specified sites: Secondary | ICD-10-CM | POA: Diagnosis present

## 2023-11-18 DIAGNOSIS — D7589 Other specified diseases of blood and blood-forming organs: Secondary | ICD-10-CM | POA: Diagnosis not present

## 2023-11-18 DIAGNOSIS — Z515 Encounter for palliative care: Secondary | ICD-10-CM | POA: Diagnosis not present

## 2023-11-18 DIAGNOSIS — E876 Hypokalemia: Secondary | ICD-10-CM | POA: Diagnosis not present

## 2023-11-18 DIAGNOSIS — Z7189 Other specified counseling: Secondary | ICD-10-CM | POA: Diagnosis not present

## 2023-11-18 DIAGNOSIS — Z170 Estrogen receptor positive status [ER+]: Secondary | ICD-10-CM | POA: Diagnosis not present

## 2023-11-18 DIAGNOSIS — R2689 Other abnormalities of gait and mobility: Secondary | ICD-10-CM | POA: Diagnosis not present

## 2023-11-18 DIAGNOSIS — R072 Precordial pain: Secondary | ICD-10-CM | POA: Diagnosis not present

## 2023-11-18 DIAGNOSIS — Z681 Body mass index (BMI) 19 or less, adult: Secondary | ICD-10-CM | POA: Diagnosis not present

## 2023-11-18 LAB — RAD ONC ARIA SESSION SUMMARY
Course Elapsed Days: 0
Plan Fractions Treated to Date: 1
Plan Prescribed Dose Per Fraction: 3 Gy
Plan Total Fractions Prescribed: 10
Plan Total Prescribed Dose: 30 Gy
Reference Point Dosage Given to Date: 3 Gy
Reference Point Session Dosage Given: 3 Gy
Session Number: 1

## 2023-11-18 MED ORDER — OXYCODONE HCL 5 MG PO TABS: 5.0000 mg | ORAL_TABLET | ORAL | 0 refills | Status: DC | PRN

## 2023-11-18 NOTE — Telephone Encounter (Signed)
MD already aware of request

## 2023-11-18 NOTE — Telephone Encounter (Signed)
Called Ms. Bevacqua to inform her that Dr. Truett Perna will send in her oxycodone refill today. She reports that Dr. Basilio Cairo said she could take 10 mg every 4 hours and she has been doing this. Pain stays at ~ 5-6/10. MD notified.

## 2023-11-19 ENCOUNTER — Other Ambulatory Visit: Payer: Self-pay

## 2023-11-19 ENCOUNTER — Ambulatory Visit
Admission: RE | Admit: 2023-11-19 | Discharge: 2023-11-19 | Discharge status: Home / Self Care | Payer: Medicare Other | Source: Ambulatory Visit | Attending: Radiation Oncology

## 2023-11-19 DIAGNOSIS — C7951 Secondary malignant neoplasm of bone: Secondary | ICD-10-CM | POA: Diagnosis not present

## 2023-11-19 DIAGNOSIS — C50412 Malignant neoplasm of upper-outer quadrant of left female breast: Secondary | ICD-10-CM | POA: Diagnosis not present

## 2023-11-19 DIAGNOSIS — Z510 Encounter for antineoplastic radiation therapy: Secondary | ICD-10-CM | POA: Diagnosis not present

## 2023-11-19 DIAGNOSIS — Z170 Estrogen receptor positive status [ER+]: Secondary | ICD-10-CM | POA: Diagnosis not present

## 2023-11-19 LAB — RAD ONC ARIA SESSION SUMMARY
Course Elapsed Days: 1
Plan Fractions Treated to Date: 2
Plan Prescribed Dose Per Fraction: 3 Gy
Plan Total Fractions Prescribed: 10
Plan Total Prescribed Dose: 30 Gy
Reference Point Dosage Given to Date: 6 Gy
Reference Point Session Dosage Given: 3 Gy
Session Number: 2

## 2023-11-20 ENCOUNTER — Other Ambulatory Visit: Payer: Self-pay

## 2023-11-20 ENCOUNTER — Ambulatory Visit
Admission: RE | Admit: 2023-11-20 | Discharge: 2023-11-20 | Disposition: A | Discharge status: Home / Self Care | Payer: Medicare Other | Source: Ambulatory Visit | Attending: Radiation Oncology | Admitting: Radiation Oncology

## 2023-11-20 DIAGNOSIS — Z510 Encounter for antineoplastic radiation therapy: Secondary | ICD-10-CM | POA: Diagnosis not present

## 2023-11-20 DIAGNOSIS — C50412 Malignant neoplasm of upper-outer quadrant of left female breast: Secondary | ICD-10-CM | POA: Diagnosis not present

## 2023-11-20 DIAGNOSIS — Z170 Estrogen receptor positive status [ER+]: Secondary | ICD-10-CM | POA: Diagnosis not present

## 2023-11-20 DIAGNOSIS — C7951 Secondary malignant neoplasm of bone: Secondary | ICD-10-CM | POA: Diagnosis not present

## 2023-11-20 LAB — RAD ONC ARIA SESSION SUMMARY
Course Elapsed Days: 2
Plan Fractions Treated to Date: 3
Plan Prescribed Dose Per Fraction: 3 Gy
Plan Total Fractions Prescribed: 10
Plan Total Prescribed Dose: 30 Gy
Reference Point Dosage Given to Date: 9 Gy
Reference Point Session Dosage Given: 3 Gy
Session Number: 3

## 2023-11-21 ENCOUNTER — Ambulatory Visit: Payer: Medicare Other

## 2023-11-21 ENCOUNTER — Other Ambulatory Visit: Payer: Medicare Other

## 2023-11-21 ENCOUNTER — Inpatient Hospital Stay: Payer: Medicare Other

## 2023-11-21 ENCOUNTER — Inpatient Hospital Stay: Payer: Medicare Other | Admitting: Oncology

## 2023-11-21 ENCOUNTER — Inpatient Hospital Stay (HOSPITAL_COMMUNITY)
Admission: AD | Admit: 2023-11-21 | Discharge: 2023-12-04 | DRG: 682 | Disposition: A | Discharge status: Skilled Nursing Facility | Payer: Medicare Other | Source: Ambulatory Visit | Attending: Internal Medicine | Admitting: Internal Medicine

## 2023-11-21 ENCOUNTER — Encounter: Payer: Self-pay | Admitting: *Deleted

## 2023-11-21 ENCOUNTER — Encounter (HOSPITAL_COMMUNITY): Payer: Self-pay | Admitting: Internal Medicine

## 2023-11-21 ENCOUNTER — Ambulatory Visit: Payer: Medicare Other | Admitting: Oncology

## 2023-11-21 ENCOUNTER — Inpatient Hospital Stay (HOSPITAL_COMMUNITY): Payer: Medicare Other

## 2023-11-21 ENCOUNTER — Other Ambulatory Visit: Payer: Self-pay

## 2023-11-21 ENCOUNTER — Encounter (HOSPITAL_COMMUNITY): Payer: Self-pay

## 2023-11-21 VITALS — BP 98/48 | HR 69 | Temp 98.1°F | Resp 18 | Ht 66.0 in | Wt 113.0 lb

## 2023-11-21 DIAGNOSIS — Z8543 Personal history of malignant neoplasm of ovary: Secondary | ICD-10-CM | POA: Diagnosis not present

## 2023-11-21 DIAGNOSIS — Z751 Person awaiting admission to adequate facility elsewhere: Secondary | ICD-10-CM

## 2023-11-21 DIAGNOSIS — I480 Paroxysmal atrial fibrillation: Secondary | ICD-10-CM | POA: Diagnosis not present

## 2023-11-21 DIAGNOSIS — C7951 Secondary malignant neoplasm of bone: Secondary | ICD-10-CM | POA: Insufficient documentation

## 2023-11-21 DIAGNOSIS — Z5189 Encounter for other specified aftercare: Secondary | ICD-10-CM | POA: Insufficient documentation

## 2023-11-21 DIAGNOSIS — Z1501 Genetic susceptibility to malignant neoplasm of breast: Secondary | ICD-10-CM | POA: Insufficient documentation

## 2023-11-21 DIAGNOSIS — I10 Essential (primary) hypertension: Secondary | ICD-10-CM | POA: Diagnosis present

## 2023-11-21 DIAGNOSIS — Z90722 Acquired absence of ovaries, bilateral: Secondary | ICD-10-CM

## 2023-11-21 DIAGNOSIS — R109 Unspecified abdominal pain: Secondary | ICD-10-CM | POA: Diagnosis not present

## 2023-11-21 DIAGNOSIS — R627 Adult failure to thrive: Secondary | ICD-10-CM

## 2023-11-21 DIAGNOSIS — N281 Cyst of kidney, acquired: Secondary | ICD-10-CM | POA: Diagnosis not present

## 2023-11-21 DIAGNOSIS — Z923 Personal history of irradiation: Secondary | ICD-10-CM

## 2023-11-21 DIAGNOSIS — C50912 Malignant neoplasm of unspecified site of left female breast: Secondary | ICD-10-CM | POA: Diagnosis not present

## 2023-11-21 DIAGNOSIS — K5903 Drug induced constipation: Secondary | ICD-10-CM | POA: Diagnosis present

## 2023-11-21 DIAGNOSIS — Z79899 Other long term (current) drug therapy: Secondary | ICD-10-CM | POA: Diagnosis not present

## 2023-11-21 DIAGNOSIS — M4854XA Collapsed vertebra, not elsewhere classified, thoracic region, initial encounter for fracture: Secondary | ICD-10-CM | POA: Diagnosis present

## 2023-11-21 DIAGNOSIS — G2581 Restless legs syndrome: Secondary | ICD-10-CM | POA: Diagnosis not present

## 2023-11-21 DIAGNOSIS — Z803 Family history of malignant neoplasm of breast: Secondary | ICD-10-CM

## 2023-11-21 DIAGNOSIS — K224 Dyskinesia of esophagus: Secondary | ICD-10-CM | POA: Diagnosis not present

## 2023-11-21 DIAGNOSIS — N183 Chronic kidney disease, stage 3 unspecified: Secondary | ICD-10-CM | POA: Diagnosis present

## 2023-11-21 DIAGNOSIS — R072 Precordial pain: Secondary | ICD-10-CM | POA: Diagnosis not present

## 2023-11-21 DIAGNOSIS — E785 Hyperlipidemia, unspecified: Secondary | ICD-10-CM | POA: Diagnosis not present

## 2023-11-21 DIAGNOSIS — Z7982 Long term (current) use of aspirin: Secondary | ICD-10-CM

## 2023-11-21 DIAGNOSIS — Z170 Estrogen receptor positive status [ER+]: Secondary | ICD-10-CM

## 2023-11-21 DIAGNOSIS — K529 Noninfective gastroenteritis and colitis, unspecified: Secondary | ICD-10-CM | POA: Diagnosis present

## 2023-11-21 DIAGNOSIS — Z9181 History of falling: Secondary | ICD-10-CM

## 2023-11-21 DIAGNOSIS — C7989 Secondary malignant neoplasm of other specified sites: Secondary | ICD-10-CM | POA: Diagnosis present

## 2023-11-21 DIAGNOSIS — Z510 Encounter for antineoplastic radiation therapy: Secondary | ICD-10-CM | POA: Diagnosis not present

## 2023-11-21 DIAGNOSIS — E43 Unspecified severe protein-calorie malnutrition: Secondary | ICD-10-CM | POA: Diagnosis present

## 2023-11-21 DIAGNOSIS — D649 Anemia, unspecified: Secondary | ICD-10-CM | POA: Diagnosis not present

## 2023-11-21 DIAGNOSIS — R079 Chest pain, unspecified: Secondary | ICD-10-CM | POA: Diagnosis not present

## 2023-11-21 DIAGNOSIS — C787 Secondary malignant neoplasm of liver and intrahepatic bile duct: Secondary | ICD-10-CM | POA: Insufficient documentation

## 2023-11-21 DIAGNOSIS — R7989 Other specified abnormal findings of blood chemistry: Secondary | ICD-10-CM

## 2023-11-21 DIAGNOSIS — Z66 Do not resuscitate: Secondary | ICD-10-CM | POA: Diagnosis present

## 2023-11-21 DIAGNOSIS — C50412 Malignant neoplasm of upper-outer quadrant of left female breast: Principal | ICD-10-CM

## 2023-11-21 DIAGNOSIS — Z1509 Genetic susceptibility to other malignant neoplasm: Secondary | ICD-10-CM | POA: Insufficient documentation

## 2023-11-21 DIAGNOSIS — R1313 Dysphagia, pharyngeal phase: Secondary | ICD-10-CM | POA: Diagnosis not present

## 2023-11-21 DIAGNOSIS — E876 Hypokalemia: Secondary | ICD-10-CM

## 2023-11-21 DIAGNOSIS — I130 Hypertensive heart and chronic kidney disease with heart failure and stage 1 through stage 4 chronic kidney disease, or unspecified chronic kidney disease: Secondary | ICD-10-CM | POA: Diagnosis present

## 2023-11-21 DIAGNOSIS — K222 Esophageal obstruction: Secondary | ICD-10-CM | POA: Diagnosis present

## 2023-11-21 DIAGNOSIS — Z681 Body mass index (BMI) 19 or less, adult: Secondary | ICD-10-CM

## 2023-11-21 DIAGNOSIS — N170 Acute kidney failure with tubular necrosis: Secondary | ICD-10-CM | POA: Diagnosis present

## 2023-11-21 DIAGNOSIS — Z515 Encounter for palliative care: Secondary | ICD-10-CM | POA: Diagnosis not present

## 2023-11-21 DIAGNOSIS — I4892 Unspecified atrial flutter: Secondary | ICD-10-CM | POA: Diagnosis present

## 2023-11-21 DIAGNOSIS — D473 Essential (hemorrhagic) thrombocythemia: Secondary | ICD-10-CM | POA: Diagnosis present

## 2023-11-21 DIAGNOSIS — Z7189 Other specified counseling: Secondary | ICD-10-CM

## 2023-11-21 DIAGNOSIS — D539 Nutritional anemia, unspecified: Secondary | ICD-10-CM | POA: Diagnosis present

## 2023-11-21 DIAGNOSIS — C7971 Secondary malignant neoplasm of right adrenal gland: Secondary | ICD-10-CM | POA: Diagnosis present

## 2023-11-21 DIAGNOSIS — Z743 Need for continuous supervision: Secondary | ICD-10-CM | POA: Diagnosis not present

## 2023-11-21 DIAGNOSIS — Z1731 Human epidermal growth factor receptor 2 positive status: Secondary | ICD-10-CM

## 2023-11-21 DIAGNOSIS — R5381 Other malaise: Secondary | ICD-10-CM | POA: Diagnosis not present

## 2023-11-21 DIAGNOSIS — T50995A Adverse effect of other drugs, medicaments and biological substances, initial encounter: Secondary | ICD-10-CM | POA: Diagnosis present

## 2023-11-21 DIAGNOSIS — K121 Other forms of stomatitis: Secondary | ICD-10-CM | POA: Diagnosis present

## 2023-11-21 DIAGNOSIS — E860 Dehydration: Secondary | ICD-10-CM | POA: Diagnosis present

## 2023-11-21 DIAGNOSIS — Z9842 Cataract extraction status, left eye: Secondary | ICD-10-CM

## 2023-11-21 DIAGNOSIS — N179 Acute kidney failure, unspecified: Principal | ICD-10-CM | POA: Diagnosis present

## 2023-11-21 DIAGNOSIS — C569 Malignant neoplasm of unspecified ovary: Secondary | ICD-10-CM | POA: Diagnosis present

## 2023-11-21 DIAGNOSIS — I2489 Other forms of acute ischemic heart disease: Secondary | ICD-10-CM | POA: Insufficient documentation

## 2023-11-21 DIAGNOSIS — D7589 Other specified diseases of blood and blood-forming organs: Secondary | ICD-10-CM

## 2023-11-21 DIAGNOSIS — T40605A Adverse effect of unspecified narcotics, initial encounter: Secondary | ICD-10-CM | POA: Diagnosis present

## 2023-11-21 DIAGNOSIS — R945 Abnormal results of liver function studies: Secondary | ICD-10-CM | POA: Diagnosis present

## 2023-11-21 DIAGNOSIS — D61818 Other pancytopenia: Secondary | ICD-10-CM | POA: Diagnosis present

## 2023-11-21 DIAGNOSIS — C786 Secondary malignant neoplasm of retroperitoneum and peritoneum: Secondary | ICD-10-CM | POA: Diagnosis present

## 2023-11-21 DIAGNOSIS — C50919 Malignant neoplasm of unspecified site of unspecified female breast: Secondary | ICD-10-CM

## 2023-11-21 DIAGNOSIS — I5032 Chronic diastolic (congestive) heart failure: Secondary | ICD-10-CM | POA: Diagnosis present

## 2023-11-21 DIAGNOSIS — K449 Diaphragmatic hernia without obstruction or gangrene: Secondary | ICD-10-CM | POA: Diagnosis present

## 2023-11-21 DIAGNOSIS — D75839 Thrombocytosis, unspecified: Secondary | ICD-10-CM | POA: Insufficient documentation

## 2023-11-21 DIAGNOSIS — Z8249 Family history of ischemic heart disease and other diseases of the circulatory system: Secondary | ICD-10-CM

## 2023-11-21 DIAGNOSIS — Z961 Presence of intraocular lens: Secondary | ICD-10-CM | POA: Diagnosis present

## 2023-11-21 DIAGNOSIS — G893 Neoplasm related pain (acute) (chronic): Secondary | ICD-10-CM | POA: Insufficient documentation

## 2023-11-21 DIAGNOSIS — Z83438 Family history of other disorder of lipoprotein metabolism and other lipidemia: Secondary | ICD-10-CM

## 2023-11-21 DIAGNOSIS — Z9841 Cataract extraction status, right eye: Secondary | ICD-10-CM

## 2023-11-21 DIAGNOSIS — E8809 Other disorders of plasma-protein metabolism, not elsewhere classified: Secondary | ICD-10-CM | POA: Diagnosis present

## 2023-11-21 DIAGNOSIS — Z85828 Personal history of other malignant neoplasm of skin: Secondary | ICD-10-CM | POA: Diagnosis not present

## 2023-11-21 DIAGNOSIS — N132 Hydronephrosis with renal and ureteral calculous obstruction: Secondary | ICD-10-CM | POA: Diagnosis not present

## 2023-11-21 DIAGNOSIS — N189 Chronic kidney disease, unspecified: Secondary | ICD-10-CM | POA: Diagnosis not present

## 2023-11-21 DIAGNOSIS — C50911 Malignant neoplasm of unspecified site of right female breast: Secondary | ICD-10-CM | POA: Diagnosis present

## 2023-11-21 DIAGNOSIS — M6281 Muscle weakness (generalized): Secondary | ICD-10-CM | POA: Diagnosis not present

## 2023-11-21 DIAGNOSIS — I422 Other hypertrophic cardiomyopathy: Secondary | ICD-10-CM | POA: Diagnosis present

## 2023-11-21 DIAGNOSIS — K219 Gastro-esophageal reflux disease without esophagitis: Secondary | ICD-10-CM | POA: Diagnosis not present

## 2023-11-21 DIAGNOSIS — R1310 Dysphagia, unspecified: Secondary | ICD-10-CM

## 2023-11-21 DIAGNOSIS — R2689 Other abnormalities of gait and mobility: Secondary | ICD-10-CM | POA: Diagnosis not present

## 2023-11-21 DIAGNOSIS — R2681 Unsteadiness on feet: Secondary | ICD-10-CM | POA: Diagnosis not present

## 2023-11-21 DIAGNOSIS — I2510 Atherosclerotic heart disease of native coronary artery without angina pectoris: Secondary | ICD-10-CM | POA: Diagnosis not present

## 2023-11-21 DIAGNOSIS — F320 Major depressive disorder, single episode, mild: Secondary | ICD-10-CM | POA: Diagnosis not present

## 2023-11-21 DIAGNOSIS — I4891 Unspecified atrial fibrillation: Secondary | ICD-10-CM

## 2023-11-21 DIAGNOSIS — Z90710 Acquired absence of both cervix and uterus: Secondary | ICD-10-CM

## 2023-11-21 DIAGNOSIS — Z87442 Personal history of urinary calculi: Secondary | ICD-10-CM

## 2023-11-21 DIAGNOSIS — Z8619 Personal history of other infectious and parasitic diseases: Secondary | ICD-10-CM

## 2023-11-21 DIAGNOSIS — R531 Weakness: Secondary | ICD-10-CM | POA: Diagnosis not present

## 2023-11-21 DIAGNOSIS — Z9049 Acquired absence of other specified parts of digestive tract: Secondary | ICD-10-CM

## 2023-11-21 LAB — CBC WITH DIFFERENTIAL (CANCER CENTER ONLY)
Abs Immature Granulocytes: 0.04 10*3/uL (ref 0.00–0.07)
Basophils Absolute: 0 10*3/uL (ref 0.0–0.1)
Basophils Relative: 0 %
Eosinophils Absolute: 0 10*3/uL (ref 0.0–0.5)
Eosinophils Relative: 0 %
HCT: 30.1 % — ABNORMAL LOW (ref 36.0–46.0)
Hemoglobin: 10 g/dL — ABNORMAL LOW (ref 12.0–15.0)
Immature Granulocytes: 1 %
Lymphocytes Relative: 7 %
Lymphs Abs: 0.3 10*3/uL — ABNORMAL LOW (ref 0.7–4.0)
MCH: 33.3 pg (ref 26.0–34.0)
MCHC: 33.2 g/dL (ref 30.0–36.0)
MCV: 100.3 fL — ABNORMAL HIGH (ref 80.0–100.0)
Monocytes Absolute: 0.1 10*3/uL (ref 0.1–1.0)
Monocytes Relative: 1 %
Neutro Abs: 4.1 10*3/uL (ref 1.7–7.7)
Neutrophils Relative %: 91 %
Platelet Count: 198 10*3/uL (ref 150–400)
RBC: 3 MIL/uL — ABNORMAL LOW (ref 3.87–5.11)
RDW: 15.7 % — ABNORMAL HIGH (ref 11.5–15.5)
WBC Count: 4.6 10*3/uL (ref 4.0–10.5)
nRBC: 0 % (ref 0.0–0.2)

## 2023-11-21 LAB — PROTEIN / CREATININE RATIO, URINE
Creatinine, Urine: 73 mg/dL
Protein Creatinine Ratio: 0.4 mg/mg{creat} — ABNORMAL HIGH (ref 0.00–0.15)
Total Protein, Urine: 29 mg/dL

## 2023-11-21 LAB — CMP (CANCER CENTER ONLY)
ALT: 421 U/L (ref 0–44)
AST: 84 U/L — ABNORMAL HIGH (ref 15–41)
Albumin: 2.6 g/dL — ABNORMAL LOW (ref 3.5–5.0)
Alkaline Phosphatase: 110 U/L (ref 38–126)
Anion gap: 9 (ref 5–15)
BUN: >100 mg/dL — ABNORMAL HIGH (ref 8–23)
CO2: 19 mmol/L — ABNORMAL LOW (ref 22–32)
Calcium: 8.2 mg/dL — ABNORMAL LOW (ref 8.9–10.3)
Chloride: 111 mmol/L (ref 98–111)
Creatinine: 2.93 mg/dL — ABNORMAL HIGH (ref 0.44–1.00)
GFR, Estimated: 15 mL/min — ABNORMAL LOW (ref >60)
Glucose, Bld: 189 mg/dL — ABNORMAL HIGH (ref 70–99)
Potassium: 4.4 mmol/L (ref 3.5–5.1)
Sodium: 139 mmol/L (ref 135–145)
Total Bilirubin: 0.7 mg/dL (ref 0.0–1.2)
Total Protein: 4.1 g/dL — ABNORMAL LOW (ref 6.5–8.1)

## 2023-11-21 LAB — BASIC METABOLIC PANEL
Anion gap: 7 (ref 5–15)
BUN: 119 mg/dL — ABNORMAL HIGH (ref 8–23)
CO2: 15 mmol/L — ABNORMAL LOW (ref 22–32)
Calcium: 7.9 mg/dL — ABNORMAL LOW (ref 8.9–10.3)
Chloride: 113 mmol/L — ABNORMAL HIGH (ref 98–111)
Creatinine, Ser: 3.07 mg/dL — ABNORMAL HIGH (ref 0.44–1.00)
GFR, Estimated: 14 mL/min — ABNORMAL LOW (ref >60)
Glucose, Bld: 156 mg/dL — ABNORMAL HIGH (ref 70–99)
Potassium: 4.4 mmol/L (ref 3.5–5.1)
Sodium: 135 mmol/L (ref 135–145)

## 2023-11-21 LAB — URINALYSIS, ROUTINE W REFLEX MICROSCOPIC
Bilirubin Urine: NEGATIVE
Glucose, UA: NEGATIVE mg/dL
Hgb urine dipstick: NEGATIVE
Ketones, ur: NEGATIVE mg/dL
Leukocytes,Ua: NEGATIVE
Nitrite: NEGATIVE
Protein, ur: NEGATIVE mg/dL
Specific Gravity, Urine: 1.016 (ref 1.005–1.030)
pH: 5 (ref 5.0–8.0)

## 2023-11-21 MED ORDER — LIDOCAINE VISCOUS HCL 2 % MT SOLN: 15.0000 mL | OROMUCOSAL | Status: DC | PRN

## 2023-11-21 MED ORDER — ONDANSETRON HCL 4 MG/2ML IJ SOLN: 4.0000 mg | Freq: Four times a day (QID) | INTRAMUSCULAR | Status: DC | PRN

## 2023-11-21 MED ORDER — POLYETHYLENE GLYCOL 3350 17 G PO PACK
17.0000 g | PACK | Freq: Every day | ORAL | Status: DC | PRN
  Administered 2023-11-22: 17 g via ORAL
  Filled 2023-11-21: qty 1

## 2023-11-21 MED ORDER — LACTATED RINGERS IV SOLN: INTRAVENOUS | Status: AC

## 2023-11-21 MED ORDER — OXYCODONE HCL 5 MG PO TABS
5.0000 mg | ORAL_TABLET | ORAL | Status: DC | PRN
  Administered 2023-11-22 – 2023-12-04 (×5): 5 mg via ORAL
  Filled 2023-11-21 (×5): qty 1

## 2023-11-21 MED ORDER — SODIUM CHLORIDE 0.45 % IV SOLN
INTRAVENOUS | Status: DC
  Filled 2023-11-21 (×2): qty 75

## 2023-11-21 MED ORDER — TRAZODONE HCL 50 MG PO TABS
50.0000 mg | ORAL_TABLET | Freq: Every evening | ORAL | Status: DC | PRN
  Administered 2023-11-24 – 2023-12-03 (×9): 50 mg via ORAL
  Filled 2023-11-21 (×10): qty 1

## 2023-11-21 MED ORDER — ACETAMINOPHEN 650 MG RE SUPP: 650.0000 mg | Freq: Four times a day (QID) | RECTAL | Status: DC | PRN

## 2023-11-21 MED ORDER — HEPARIN SODIUM (PORCINE) 5000 UNIT/ML IJ SOLN
5000.0000 [IU] | Freq: Three times a day (TID) | INTRAMUSCULAR | Status: DC
  Administered 2023-11-21 – 2023-11-27 (×17): 5000 [IU] via SUBCUTANEOUS
  Filled 2023-11-21 (×18): qty 1

## 2023-11-21 MED ORDER — SODIUM CHLORIDE 0.9 % IV SOLN: INTRAVENOUS | Status: DC

## 2023-11-21 MED ORDER — ACETAMINOPHEN 325 MG PO TABS
650.0000 mg | ORAL_TABLET | Freq: Four times a day (QID) | ORAL | Status: DC | PRN
  Administered 2023-11-22 – 2023-11-25 (×4): 650 mg via ORAL
  Filled 2023-11-21 (×4): qty 2

## 2023-11-21 MED ORDER — ONDANSETRON HCL 4 MG PO TABS: 4.0000 mg | ORAL_TABLET | Freq: Four times a day (QID) | ORAL | Status: DC | PRN

## 2023-11-21 MED ORDER — BISACODYL 10 MG RE SUPP
10.0000 mg | Freq: Every day | RECTAL | Status: DC | PRN
  Administered 2023-11-21: 10 mg via RECTAL
  Filled 2023-11-21: qty 1

## 2023-11-21 MED ORDER — SENNOSIDES-DOCUSATE SODIUM 8.6-50 MG PO TABS
2.0000 | ORAL_TABLET | Freq: Two times a day (BID) | ORAL | Status: DC
  Administered 2023-11-21 – 2023-11-25 (×5): 2 via ORAL
  Filled 2023-11-21 (×7): qty 2

## 2023-11-21 MED ORDER — ALBUTEROL SULFATE (2.5 MG/3ML) 0.083% IN NEBU: 2.5000 mg | INHALATION_SOLUTION | RESPIRATORY_TRACT | Status: DC | PRN

## 2023-11-21 MED ORDER — HYDROMORPHONE HCL 1 MG/ML IJ SOLN
0.5000 mg | INTRAMUSCULAR | Status: DC | PRN
  Administered 2023-11-23 – 2023-11-27 (×4): 1 mg via INTRAVENOUS
  Filled 2023-11-21 (×4): qty 1

## 2023-11-21 NOTE — Progress Notes (Signed)
Called report to nurse Tyana at [facility]. Patient en route to [facility] with her daughter driving in stable condition via w/c.

## 2023-11-21 NOTE — Progress Notes (Signed)
Pt hypotensive during admission. Provider aware. No new orders. Per provider to infuse maintenance LR fluids.

## 2023-11-21 NOTE — Progress Notes (Signed)
Pt admitted to the unit in a wheelchair. A&OX4. Daughter at the bedside.

## 2023-11-21 NOTE — Progress Notes (Signed)
CRITICAL VALUE STICKER  CRITICAL VALUE: ALT 421 (creat 2.93)  RECEIVER (on-site recipient of call):Erron Wengert,RN  DATE & TIME NOTIFIED: 11/21/23 @ 0919  MESSENGER (representative from lab):Jessica  MD NOTIFIED: Dr. Truett Perna  TIME OF NOTIFICATION: 8295  RESPONSE:  Being seen by provider at this time.

## 2023-11-21 NOTE — Patient Instructions (Signed)

## 2023-11-21 NOTE — H&P (Signed)
History and Physical  [name] ZOX:[id number] DOB: 27-Oct-1933 DOA: 11/21/2023  PCP: Lorenda Ishihara, MD   Chief Complaint: Back and abdominal pain  HPI: [name] is a 89 y.o. 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  History is provided by my discussion with the patient, her daughter at the bedside, as well as with her oncologist Dr. Truett Perna earlier today.  She began palliative radiation to her spine 11/18/2023, continues to have mid and lower back pain, she takes oxycodone for pain, and is not on a bowel regimen.  Right now her most severe pain is abdominal pain and gas from constipation, has probably been about 5 days since she last had a bowel movement.  She also has some pain with swallowing.  Denies any fevers, chills, is also taking Faslodex and Abemaciclib.  Has not been eating or drinking very much due to vague nausea and abdominal pain and distention, has continued to take her antihypertensive medications.  Today in the oncology clinic where she went for routine follow-up, blood pressure 98/48, pulse 69, afebrile.  She was noted on exam to have ulcers in the left buccal mucosa and as well as the left side of the tongue.  She was directly admitted to the hospitalist service for management of her pain, constipation, and acute renal failure.  Review of Systems: Please see HPI for pertinent positives and negatives. A complete 10 system review of systems are otherwise negative.  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   Breast cancer (HCC) 2010   RIGHT   CAD (coronary artery disease)    Dysuria    Family history of breast cancer    GERD (gastroesophageal reflux disease)    HX OF   Heart murmur    History of blood  transfusion    History of breast cancer ONCOLOGIST-- DR Truett Perna   DX 2010--  RIGHT BREAST DCIS  HIGH GRADE S/P LUMPECTOMY W/ SLN BX--  NO RECURRENCE   History of kidney stones    History of ovarian cancer    2009--  S/P COLON RESECTION FOR PAPILLARY SEROUS CARCINOMA, BORDERLINE OVARIAN CANCER VERSUS PRIMARY PERITONEAL CARCINOMA WITH PSAMMOMA BODIES--  NO RECURRENCE   History of skin cancer    excision basal cell   Hyperlipidemia    Hypertension    Kidney stones 7/14   Nocturia    Ovarian carcinoma (HCC)    papillary serous-low grade, recurrence found in fat necrosis during ileostomy   Renal stones 2014   Shingles 30 YRS AGO   Ureteral calculi    bilateral   Past Surgical History:  Procedure Laterality Date   APPENDECTOMY     BREAST LUMPECTOMY Right 04-13-2009   W/ SLN BX   BREAST LUMPECTOMY WITH RADIOACTIVE SEED LOCALIZATION Left 06/11/2019   Procedure: LEFT BREAST LUMPECTOMY WITH RADIOACTIVE SEED LOCALIZATION;  Surgeon: Griselda Miner, MD;  Location: [facility] OR;  Service: General;  Laterality: Left;   CARDIAC CATHETERIZATION  11-11-2005 DR Verdis Prime   MODERATE LAD DISEASE/ NORMAL LVF/ EF 65-75%   CATARACT EXTRACTION W/ INTRAOCULAR LENS IMPLANT Bilateral 04/26/2013   CHOLECYSTECTOMY  1990   OPEN   COLONOSCOPY     CYSTOSCOPY W/ URETERAL STENT PLACEMENT Bilateral 05/07/2013   Procedure: CYSTOSCOPY WITH STENT REPLACEMENTS;  Surgeon: Sebastian Ache, MD;  Location: [facility];  Service: Urology;  Laterality: Bilateral;   CYSTOSCOPY WITH BIOPSY N/A 04/07/2013   Procedure: CYSTOSCOPY WITH BIOPSY and fulgeration;  Surgeon: Sebastian Ache, MD;  Location: [facility];  Service: Urology;  Laterality: N/A;   CYSTOSCOPY WITH RETROGRADE PYELOGRAM, URETEROSCOPY AND STENT PLACEMENT Bilateral 05/07/2013   Procedure: CYSTOSCOPY WITH RETROGRADE PYELOGRAM, URETEROSCOPY ;  Surgeon: Sebastian Ache, MD;  Location: [facility];  Service: Urology;  Laterality: Bilateral;   CYSTOSCOPY  WITH RETROGRADE PYELOGRAM, URETEROSCOPY AND STENT PLACEMENT Bilateral 01/06/2019   Procedure: CYSTOSCOPY WITH RETROGRADE PYELOGRAM, URETEROSCOPY AND STENT PLACEMENT, FIRST STAGE;  Surgeon: Sebastian Ache, MD;  Location: [facility];  Service: Urology;  Laterality: Bilateral;   CYSTOSCOPY WITH RETROGRADE PYELOGRAM, URETEROSCOPY AND STENT PLACEMENT Bilateral 01/27/2019   Procedure: CYSTOSCOPY WITH RETROGRADE PYELOGRAM, URETEROSCOPY AND STENT PLACEMENT;  Surgeon: Sebastian Ache, MD;  Location: [facility];  Service: Urology;  Laterality: Bilateral;  75 MINS   CYSTOSCOPY WITH STENT PLACEMENT Bilateral 04/07/2013   Procedure: CYSTOSCOPY WITH STENT PLACEMENT;  Surgeon: Sebastian Ache, MD;  Location: [facility];  Service: Urology;  Laterality: Bilateral;   DX LAPAROSCOPY W/ PERITONEAL AND OMENTAL BX'S AND WASHINGS  02-16-2008   EXCISION RIGHT BREAST MASS  02-10-2002   EXP. LAP. EXTENSIVE ADHESIOLYSIS/ RESECTION TERMINAL ILEUM , ASCENDING AND DESCENDING COLON WITH CREATION ILEOSTOMY AND MUCOUS FISTULA  02-19-2008   PERFERATION AND ABD. CANCER--  TAKEDOWN ILEOSTOMY 08-23-2008   HOLMIUM LASER APPLICATION Bilateral 01/06/2019   Procedure: HOLMIUM LASER APPLICATION;  Surgeon: Sebastian Ache, MD;  Location: [facility];  Service: Urology;  Laterality: Bilateral;   HOLMIUM LASER APPLICATION Bilateral 01/27/2019   Procedure: HOLMIUM LASER APPLICATION;  Surgeon: Sebastian Ache, MD;  Location: [facility];  Service: Urology;  Laterality: Bilateral;   I & D EXTREMITY Right 02/02/2022   Procedure: IRRIGATION AND DEBRIDEMENT HAND;  Surgeon: Bradly Bienenstock, MD;  [facility];  Service: Orthopedics;  Laterality: Right;   ILEOSTOMY CLOSURE  08/2008   TOTAL ABDOMINAL HYSTERECTOMY W/ BILATERAL SALPINGOOPHORECTOMY  1976   TOTAL ABDOMINAL HYSTERECTOMY W/ BILATERAL SALPINGOOPHORECTOMY  1976   VULVAR LESION REMOVAL N/A 08/22/2014   Procedure: EXCISION OF VULVAR CYST  ;  Surgeon: Annamaria Boots, MD;  Location: [facility];  Service: Gynecology;   Laterality: N/A;   WOUND DEBRIDEMENT  05/28/2012   Procedure: DEBRIDEMENT ABDOMINAL WOUND;  Surgeon: Currie Paris, MD;  Location: [facility];  Service: General;  Laterality: N/A;  excision chronic wound abdominal wall   Social History:  reports that she has never smoked. She has never used smokeless tobacco. She reports that she does not drink alcohol and does not use drugs.  Allergies  Allergen Reactions   Ramipril Other (See Comments)    Family History  Problem Relation Age of Onset   Heart disease Mother    Hyperlipidemia Mother    Breast cancer Sister    Cancer Sister        breast     Prior to Admission medications   Medication Sig Start Date End Date Taking? Authorizing Provider  abemaciclib (VERZENIO) 100 MG tablet Take 1 tablet (100 mg total) by mouth 2 (two) times daily. 11/07/23   Ladene Artist, MD  amLODipine (NORVASC) 10 MG tablet Take 1 tablet (10 mg total) by mouth daily. Patient taking differently: Take 10 mg by mouth daily with lunch. 06/11/17   Lyn Records, MD  aspirin EC 81 MG tablet Take 81 mg by mouth daily.  [provider]  BELSOMRA 10 MG TABS Take 10 mg by mouth at bedtime as needed (sleep).  01/20/19   [provider]  Calcium Carbonate-Vitamin D (CALTRATE 600+D PO) Take 1 tablet by mouth 2 (two) times daily.    [provider]  carvedilol (COREG) 25 MG tablet Take 1 tablet (25 mg total) by mouth 2 (two) times daily with a meal. 02/27/22   Lyn Records, MD  chlorthalidone (HYGROTON) 25 MG tablet Take 0.5 tablets (12.5 mg total) by mouth daily. Patient taking differently: Take 12.5 mg by mouth in the morning and at bedtime. 02/15/21   Lyn Records, MD  Cholecalciferol (VITAMIN D3) 50 MCG (2000 UT) TABS Take 2,000 Units by mouth in the morning and at bedtime.    [provider]  dexamethasone (DECADRON) 4 MG tablet Take 1 tab (4 mg) twice a day for 3 days then take 1 tab daily 10/21/23   Rana Snare,  NP  hydrALAZINE (APRESOLINE) 10 MG tablet Take 1 tablet (10 mg total) by mouth in the morning and at bedtime. Patient taking differently: Take 25 mg by mouth 3 (three) times daily. 05/31/21   Lyn Records, MD  hydroxychloroquine (PLAQUENIL) 200 MG tablet Take 200 mg by mouth daily. 11/08/21   [provider]  hydroxyurea (HYDREA) 500 MG capsule TAKE 1 CAPSULE BY MOUTH EVERY MONDAY, WEDNESDAY, AND FRIDAY. MAY TAKE WITH FOOD TO MINIMIZE GI SIDE EFFECTS 10/23/23   Ladene Artist, MD  isosorbide mononitrate (IMDUR) 60 MG 24 hr tablet Take 1 tablet (60 mg total) by mouth daily. 05/16/23   Jake Bathe, MD  oxyCODONE (OXY IR/ROXICODONE) 5 MG immediate release tablet Take 1 tablet (5 mg total) by mouth every 4 (four) hours as needed for severe pain (pain score 7-10) or moderate pain (pain score 4-6). May take 2 tablets if pain is severe.  Do not drive while taking pain medication. 11/18/23   Rana Snare, NP  potassium chloride (KLOR-CON M) 10 MEQ tablet Take 1 tablet (10 meq) twice daily for 3 days, then 1 tablet daily. 11/07/23   Rana Snare, NP  PROCTOZONE-HC 2.5 % rectal cream PLACE 1 APPLICATION RECTALLY 2 TIMES A DAY AS NEEDED FOR HEMORRHOIDS. Patient taking differently: Place 1 application  rectally 2 (two) times daily as needed for hemorrhoids. 01/09/15   Cross, Efraim Kaufmann D, NP  rosuvastatin (CRESTOR) 10 MG tablet Take 1 tablet by mouth every other day.    [provider]  tiZANidine (ZANAFLEX) 4 MG tablet Take 4 mg by mouth 2 (two) times daily. Patient not taking: Reported on 11/21/2023 10/10/23   [provider]  triamcinolone (KENALOG) 0.1 % Apply 1 application. topically as needed (rash). 04/04/20   [provider]    Physical Exam: BP (!) 99/40 (BP Location: Left Arm)   Pulse 62   Temp 98.6 F (37 C) (Oral)   Resp 16   LMP 10/07/1974   SpO2 97%  General:  Alert, oriented, calm, in no acute distress, thin female appearing slightly younger than her stated  age.  Looks chronically ill.  Her daughter is at the bedside. Cardiovascular: RRR, no murmurs or rubs, no peripheral edema  Respiratory: clear to auscultation bilaterally, no wheezes, no crackles  Abdomen: soft, nontender, slightly distended, normal bowel tones heard  Skin: dry, no rashes  Musculoskeletal: no joint effusions, normal range of motion  Psychiatric: appropriate affect, normal speech  Neurologic: extraocular muscles intact, clear speech, moving all extremities  with intact sensorium         Labs on Admission:  Basic Metabolic Panel: Recent Labs  Lab 11/21/23 0840  NA 139  K 4.4  CL 111  CO2 19*  GLUCOSE 189*  BUN >100*  CREATININE 2.93*  CALCIUM 8.2*   Liver Function Tests: Recent Labs  Lab 11/21/23 0840  AST 84*  ALT 421*  ALKPHOS 110  BILITOT 0.7  PROT 4.1*  ALBUMIN 2.6*   No results for input(s): "LIPASE", "AMYLASE" in the last 168 hours. No results for input(s): "AMMONIA" in the last 168 hours. CBC: Recent Labs  Lab 11/21/23 0840  WBC 4.6  NEUTROABS 4.1  HGB 10.0*  HCT 30.1*  MCV 100.3*  PLT 198   Cardiac Enzymes: No results for input(s): "CKTOTAL", "CKMB", "CKMBINDEX", "TROPONINI" in the last 168 hours. BNP (last 3 results) No results for input(s): "BNP" in the last 8760 hours.  ProBNP (last 3 results) No results for input(s): "PROBNP" in the last 8760 hours.  CBG: No results for input(s): "GLUCAP" in the last 168 hours.  Radiological Exams on Admission: No results found. Assessment/Plan [name] is a 89 y.o. 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   Acute renal failure-in the setting of CKD stage III, likely due to ATN from hypotension and dehydration. -Inpatient admission -Avoid hypotension, hold nephrotoxins -Hydrate with LR -Recheck renal function  now (received some IV fluids in cancer clinic) and in the morning -Check renal ultrasound to rule out obstruction  History of hypertension-given hypotension and dehydration, hold Imdur, amlodipine, Coreg  Breast cancer with known spinal and hepatic metastasis.  Under the care of Dr. Truett Perna.  Abnormal LFTs-likely due to known hepatic metastases  Cancer related pain-currently uncontrolled, though not intractable.  Continue oxycodone and IV morphine as necessary for pain management.  Constipation-patient was not on bowel regimen at home, severe constipation is likely contributing to her significant abdominal discomfort. -Senokot 2 tabs p.o. twice daily -MiraLAX daily as needed -Bisacodyl suppository daily as needed  DVT prophylaxis: Subcutaneous heparin    Code Status: Limited: Do not attempt resuscitation (DNR) -DNR-LIMITED -Do Not Intubate/DNI , this was confirmed with the patient and her daughter at the bedside at the time of admission.  Consults called: None  Admission status: The appropriate patient status for this patient is INPATIENT. Inpatient status is judged to be reasonable and necessary in order to provide the required intensity of service to ensure the patient's safety. The patient's presenting symptoms, physical exam findings, and initial radiographic and laboratory data in the context of their chronic comorbidities is felt to place them at high risk for further clinical deterioration. Furthermore, it is not anticipated that the patient will be medically stable for discharge from the hospital within 2 midnights of admission.    I certify that at the point of admission it is my clinical judgment that the patient will require inpatient hospital care spanning beyond 2 midnights from the point of admission due to high intensity of service, high risk for further deterioration and high frequency of surveillance required  Time spent: 55 minutes  Monroe Toure Sharlette Dense MD [facility] Pager [phone]  If 7PM-7AM, please contact night-coverage [email or web address] Password [facility]  11/21/2023, 3:25 PM

## 2023-11-21 NOTE — Progress Notes (Addendum)
[facility] OFFICE PROGRESS NOTE   Diagnosis: Breast cancer  INTERVAL HISTORY:   [name] returns for scheduled visit.  She began palliative radiation to the spine on 11/18/2023.  She continues to have mid and lower back pain.  The pain is partially relieved with oxycodone.  She complains of constipation.  She has anterior chest discomfort when swallowing.  She has limited food and liquid intake.  No leg weakness or difficulty with bladder control. She began treatment with Faslodex and Abemaciclib on 141 2025  Objective:  Vital signs in last 24 hours:  Blood pressure (!) 98/48, pulse 69, temperature 98.1 F (36.7 C), resp. rate 18, height 5\' 6"  (1.676 m), weight 113 lb (51.3 kg), last menstrual period 10/07/1974, SpO2 98%.    HEENT: 3 mm ulcers at the left buccal mucosa and left side of the tongue Resp: Lungs clear bilaterally Cardio: Regular rate and rhythm GI: No hepatosplenomegaly, nontender Vascular: No leg edema    Lab Results:  Lab Results  Component Value Date   WBC 4.6 11/21/2023   HGB 10.0 (L) 11/21/2023   HCT 30.1 (L) 11/21/2023   MCV 100.3 (H) 11/21/2023   PLT 198 11/21/2023   NEUTROABS 4.1 11/21/2023    CMP  Lab Results  Component Value Date   NA 139 11/21/2023   K 4.4 11/21/2023   CL 111 11/21/2023   CO2 19 (L) 11/21/2023   GLUCOSE 189 (H) 11/21/2023   BUN >100 (H) 11/21/2023   CREATININE 2.93 (H) 11/21/2023   CALCIUM 8.2 (L) 11/21/2023   PROT 4.1 (L) 11/21/2023   ALBUMIN 2.6 (L) 11/21/2023   AST 84 (H) 11/21/2023   ALT 421 (HH) 11/21/2023   ALKPHOS 110 11/21/2023   BILITOT 0.7 11/21/2023   GFRNONAA 15 (L) 11/21/2023   GFRAA 44 (L) 06/07/2019    Lab Results  Component Value Date   CA125 12 07/15/2016     Medications: I have reviewed the patient's current medications.   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  Status post a needle localized lumpectomy and sentinel lymph node biopsy 04/13/2009 with the pathology confirming high-grade ductal carcinoma in situ with an associated 0.26 mm invasive carcinoma, ER positive, PR positive, and HER2 positive. Status post adjuvant right breast radiation completed 06/19/2009. Initiation of Arimidex 05/01/2009.  Completed 5 years in July 2015 Family history of breast cancer. History of Clostridium difficile colitis. Frequent bowel movements following the bowel resection in 2009. 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 CTs 10/16/2023-innumerable lung nodules both lungs; multiple new ill-defined low-density liver lesions; bony metastatic disease in the thoracic and lumbar spine.  Severe compression fracture at T11 with osseous retropulsion.  Small right greater than  left pleural effusions. Biopsy liver lesion 10/30/2023-metastatic moderate to poorly differentiated adenocarcinoma consistent with breast origin; HER2 negative (1+), ER +100%, PR +30%, Ki-67 60%, ESR 1 nutation positive, PIK3CA negative Faslodex/Abemaciclib  11/07/2023 Elevated platelet count-essential thrombocytosis 07/31/22- postitive [id number] 03/05/2023-hydroxyurea 500 mg Monday, Wednesday, and Friday CHEK2 pathogenic mutation positive Severe back pain-MRI 10/09/2023 with compression fracture at T11, abnormal signal T9, T10, T11 and T12 as well as several adjacent ribs; right greater than left pleural effusion Palliative radiation to the lower thoracic, lumbar, and upper sacrum 11/18/2023     Disposition: [name] is metastatic breast cancer.  She began systemic therapy with Faslodex and Abemaciclib on 11/07/2023.  She has pain secondary to metastatic disease involving the spine and a T11 compression fracture.  She began a course of pail of radiation to the spine on 11/18/2023.  She presents today with persistent pain and constipation.  The BUN and creatinine are elevated.  I suspect she is dehydrated.  Ms. Cilia will be admitted for intravenous hydration, pain control, and follow-up of the elevated BUN and creatinine.  We discussed CPR and ACLS.  She will continue the no CODE BLUE status.  I will contact the hospitalist service to request hospital admission.  I will check on her 11/24/2023.  Please call oncology over the weekend as needed.  We will begin intravenous fluids and provide narcotic analgesics as needed while she is waiting on a hospital bed.  Recommendations: Admit for intravenous hydration, pain control, and relief of constipation Place Faslodex and Abemaciclib on hold Repeat BUN and creatinine on 11/22/2023 Please call oncology as needed, I will see her 11/24/2023 No CODE BLUE  Thornton Papas, MD  11/21/2023  9:38 AM

## 2023-11-21 NOTE — Progress Notes (Signed)
Per pt, would like code status to be DNR.

## 2023-11-21 NOTE — Plan of Care (Signed)

## 2023-11-21 NOTE — Plan of Care (Signed)
BMP was repeated this afternoon, creatinine up to 3.07, nonion gap metabolic acidosis.  Renal ultrasound was already ordered Added on Foley catheter, urinalysis, urine protein-creatinine ratio.  Discussed with Dr. Signe Colt from [facility] who agreed with the above and will put her on the list for nephrology consultation in the morning.

## 2023-11-22 DIAGNOSIS — Z170 Estrogen receptor positive status [ER+]: Secondary | ICD-10-CM | POA: Diagnosis not present

## 2023-11-22 DIAGNOSIS — C50412 Malignant neoplasm of upper-outer quadrant of left female breast: Secondary | ICD-10-CM | POA: Diagnosis not present

## 2023-11-22 DIAGNOSIS — N179 Acute kidney failure, unspecified: Secondary | ICD-10-CM | POA: Diagnosis not present

## 2023-11-22 LAB — CBC
HCT: 30.8 % — ABNORMAL LOW (ref 36.0–46.0)
Hemoglobin: 9.9 g/dL — ABNORMAL LOW (ref 12.0–15.0)
MCH: 33.3 pg (ref 26.0–34.0)
MCHC: 32.1 g/dL (ref 30.0–36.0)
MCV: 103.7 fL — ABNORMAL HIGH (ref 80.0–100.0)
Platelets: 172 10*3/uL (ref 150–400)
RBC: 2.97 MIL/uL — ABNORMAL LOW (ref 3.87–5.11)
RDW: 15.7 % — ABNORMAL HIGH (ref 11.5–15.5)
WBC: 4.9 10*3/uL (ref 4.0–10.5)
nRBC: 0 % (ref 0.0–0.2)

## 2023-11-22 LAB — BASIC METABOLIC PANEL
Anion gap: 10 (ref 5–15)
BUN: 104 mg/dL — ABNORMAL HIGH (ref 8–23)
CO2: 17 mmol/L — ABNORMAL LOW (ref 22–32)
Calcium: 7.9 mg/dL — ABNORMAL LOW (ref 8.9–10.3)
Chloride: 112 mmol/L — ABNORMAL HIGH (ref 98–111)
Creatinine, Ser: 2.68 mg/dL — ABNORMAL HIGH (ref 0.44–1.00)
GFR, Estimated: 17 mL/min — ABNORMAL LOW (ref >60)
Glucose, Bld: 80 mg/dL (ref 70–99)
Potassium: 3.8 mmol/L (ref 3.5–5.1)
Sodium: 139 mmol/L (ref 135–145)

## 2023-11-22 MED ORDER — POLYETHYLENE GLYCOL 3350 17 G PO PACK
17.0000 g | PACK | Freq: Every day | ORAL | Status: DC
  Administered 2023-11-23: 17 g via ORAL
  Filled 2023-11-22: qty 1

## 2023-11-22 MED ORDER — FLEET ENEMA RE ENEM
1.0000 | ENEMA | Freq: Once | RECTAL | Status: AC
  Administered 2023-11-22: 1 via RECTAL
  Filled 2023-11-22: qty 1

## 2023-11-22 MED ORDER — LACTATED RINGERS IV SOLN: INTRAVENOUS | Status: AC

## 2023-11-22 MED ORDER — SODIUM BICARBONATE 650 MG PO TABS
650.0000 mg | ORAL_TABLET | Freq: Three times a day (TID) | ORAL | Status: DC
  Administered 2023-11-22 – 2023-11-23 (×5): 650 mg via ORAL
  Filled 2023-11-22 (×6): qty 1

## 2023-11-22 NOTE — Progress Notes (Signed)
 IP PROGRESS NOTE  Subjective:   [name] was admitted yesterday with failure to thrive, dehydration, pain, and renal failure.  Her chief complaint is constipation.  She last had a bowel movement approximately 6 days ago.  She requests an enema.  No nausea or vomiting.  She continues to have back pain.  Objective: Vital signs in last 24 hours: Blood pressure (!) 140/52, pulse 80, temperature (!) 97.5 F (36.4 C), temperature source Oral, resp. rate 17, height 5\' 6"  (1.676 m), weight 117 lb 15.1 oz (53.5 kg), last menstrual period 10/07/1974, SpO2 96%.  Intake/Output from previous day: 02/14 0701 - 02/15 0700 In: 1881.7 [P.O.:200; I.V.:1681.7] Out: 250 [Urine:250]  Physical Exam:  HEENT: 3 mm ulcer at the left side of the tongue, no thrush Abdomen: Soft and nontender, no mass Extremities: The right lower leg is slightly larger than the left side, no erythema or tenderness    Lab Results: Recent Labs    11/21/23 0840 11/22/23 0802  WBC 4.6 4.9  HGB 10.0* 9.9*  HCT 30.1* 30.8*  PLT 198 172    BMET Recent Labs    11/21/23 1611 11/22/23 0802  NA 135 139  K 4.4 3.8  CL 113* 112*  CO2 15* 17*  GLUCOSE 156* 80  BUN 119* 104*  CREATININE 3.07* 2.68*  CALCIUM 7.9* 7.9*    Lab Results  Component Value Date   CA125 12 07/15/2016    Studies/Results: US RENAL Result Date: 11/21/2023 CLINICAL DATA:  Acute kidney injury. EXAM: RENAL / URINARY TRACT ULTRASOUND COMPLETE COMPARISON:  CT 10/16/2023 FINDINGS: Right Kidney: Renal measurements: 10.1 x 4.4 x 4.7 cm = volume: 110 mL. Moderate hydronephrosis is new from prior CT. Mild increased renal parenchymal echogenicity typical of chronic renal disease. Renal calculi on prior CT not well-defined on the current exam. No evidence of focal lesion. Left Kidney: Renal measurements: 9 x 4.1 x 4.3 cm = volume: 83 mL. No hydronephrosis. Thinning of renal parenchyma with increased echogenicity. Shadowing 9 mm stone. Small renal cysts,  largest 1.7 cm. No evidence of suspicious lesion. Bladder: Appears normal for degree of bladder distention. Other: None. IMPRESSION: 1. Moderate right hydronephrosis is new from prior CT. 2. Increased renal parenchymal echogenicity typical of chronic medical renal disease. 3. Left renal stone. Known right renal calculi on prior CT not well seen. Electronically Signed   By: Narda Rutherford M.D.   On: 11/21/2023 22:06    Medications: I have reviewed the patient's current medications.  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  Status post a needle localized lumpectomy and sentinel lymph node biopsy 04/13/2009 with the pathology confirming high-grade ductal carcinoma in situ with an associated 0.26 mm invasive carcinoma, ER positive, PR positive, and HER2 positive. Status post adjuvant right breast radiation completed 06/19/2009. Initiation of Arimidex 05/01/2009.  Completed 5 years in July 2015 Family history of breast cancer. History of Clostridium difficile colitis. Frequent bowel movements following the bowel resection in 2009. 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 CTs 10/16/2023-innumerable lung nodules both lungs; multiple new ill-defined low-density liver lesions; bony metastatic disease in the thoracic and lumbar spine.  Severe compression fracture at T11 with osseous retropulsion.  Small right greater than left pleural effusions. Biopsy liver lesion 10/30/2023-metastatic moderate to poorly differentiated adenocarcinoma consistent with breast origin; HER2 negative (1+), ER +100%, PR +30%, Ki-67 60%, ESR 1 nutation positive, PIK3CA negative Faslodex/Abemaciclib 11/07/2023 Elevated platelet count-essential thrombocytosis 07/31/22- postitive [id number] 03/05/2023-hydroxyurea 500 mg Monday, Wednesday, and Friday CHEK2 pathogenic mutation positive Severe back pain-MRI 10/09/2023 with compression fracture at T11, abnormal signal T9, T10, T11 and T12 as well as several adjacent ribs; right greater than left pleural effusion Palliative radiation to the lower thoracic, lumbar, and upper sacrum 11/18/2023 12.  Admission 11/21/2023 with pain, constipation, dehydration, and renal failure   [name] was admitted yesterday with dehydration/renal failure, constipation, pain, and failure to thrive.  The BUN and creatinine are partially improved this morning.  She continues to have constipation.  She requests an enema.  The constipation is likely secondary to narcotics.  She has metastatic breast cancer and began treatment with Faslodex/Abemaciclib 2 weeks ago.  Treatment is now on hold.  We will consider continuing the current treatment versus switching to a different systemic therapy regimen based on her clinical status over the next few days.  Recommendations: Continue bowel regimen, enema today Continue intravenous hydration, follow the renal function Repeat CMP to follow-up on the elevated liver enzymes Consider right leg Doppler if there  is persistent swelling in the right lower extremity Continue oxycodone and Dilaudid for pain Resume palliative radiation to the spine on 11/24/2023 Please call oncology as needed, I will see her 11/24/2023      LOS: 1 day   Thornton Papas, MD   11/22/2023, 9:35 AM

## 2023-11-22 NOTE — Progress Notes (Signed)
 [facility]  PROGRESS NOTE  [name] QQV:[id number] DOB: 1934-10-01 DOA: 11/21/2023 PCP: Lorenda Ishihara, MD   Brief HPI:    89 y.o. 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      Assessment/Plan:    Acute renal failure-in the setting of CKD stage III, likely due to ATN from hypotension and dehydration. -Started on LR at 50 mL/h -Creatinine improved to 2.68 -Follow BMP in am - Renal ultrasound shows moderate hydronephrosis; will obtain CT abdomen/pelvis without contrast  History of hypertension -Imdur, amlodipine, Coreg on hold due to hypotension  Breast cancer with hepatic and spinal metastasis -Oncology following  Abnormal LFTs -Secondary to hepatic metastasis  Cancer-related pain -Continue oxycodone, IV morphine as needed  Constipation -Patient takes oxycodone at home -Fleets enema ordered     Medications     heparin  5,000 Units Subcutaneous Q8H   polyethylene glycol  17 g Oral Daily   senna-docusate  2 tablet Oral BID   sodium phosphate  1 enema Rectal Once     Data Reviewed:   CBG:  No results for input(s): "GLUCAP" in the last 168 hours.  SpO2: 96 %    Vitals:   11/21/23 1500 11/21/23 1525 11/22/23 0438  BP: (!) 99/40  (!) 140/52  Pulse: 62  80  Resp: 16  17  Temp: 98.6 F (37 C)  (!) 97.5 F (36.4 C)  TempSrc: Oral  Oral  SpO2: 97%  96%  Weight:  53.5 kg   Height:  5\' 6"  (1.676 m)       Data Reviewed:  Basic Metabolic Panel: Recent Labs  Lab 11/21/23 0840 11/21/23 1611 11/22/23 0802  NA 139 135 139  K 4.4 4.4 3.8  CL 111 113* 112*  CO2 19* 15* 17*  GLUCOSE 189* 156* 80  BUN >100* 119* 104*  CREATININE 2.93* 3.07* 2.68*  CALCIUM 8.2* 7.9* 7.9*    CBC: Recent Labs  Lab 11/21/23 0840 11/22/23 0802  WBC 4.6 4.9  NEUTROABS 4.1   --   HGB 10.0* 9.9*  HCT 30.1* 30.8*  MCV 100.3* 103.7*  PLT 198 172    LFT Recent Labs  Lab 11/21/23 0840  AST 84*  ALT 421*  ALKPHOS 110  BILITOT 0.7  PROT 4.1*  ALBUMIN 2.6*     Antibiotics: Anti-infectives (From admission, onward)    None        DVT prophylaxis: Heparin  Code Status: DNR  Family Communication:    CONSULTS    Subjective   Patient seen, complains of constipation.   Objective    Physical Examination:   General-appears in no acute distress Heart-S1-S2, regular, no murmur auscultated Lungs-clear to auscultation bilaterally, no wheezing or crackles auscultated Abdomen-soft, nontender, no organomegaly Extremities-no edema in the lower extremities Neuro-alert, oriented x3, no focal deficit noted  Status is: Inpatient:             Meredeth Ide   [facility] If 7PM-7AM, please contact night-coverage at [email or web address], Office  [phone]   11/22/2023, 10:02 AM  LOS: 1 day

## 2023-11-22 NOTE — Plan of Care (Signed)

## 2023-11-22 NOTE — Plan of Care (Signed)

## 2023-11-22 NOTE — Progress Notes (Signed)
 Chaplain visited pt in response to [facility] consult for support/prayer. Pt was alert and willing to visit. Her daughter, Larita Fife was at bedside. Pt was visibly in pain intermittently throughout this visit. Her nurse was present for a portion of it. Larita Fife shared that pt was experiencing constipation and believes this is contributing to pt's pain. Pt has the support of her church community. Pt was not up for talking much during this visit, though she did request prayer. Chaplain provided prayer, reflective listening and orientation to chaplain services.  [address], [place]   11/22/23 1600  Spiritual Encounters  Type of Visit Initial  Care provided to: Patient;Family  Conversation partners present during encounter Nurse  Referral source Patient request  Reason for visit Routine spiritual support  OnCall Visit Yes  Spiritual Framework  Presenting Themes Impactful experiences and emotions  Community/Connection Family;Faith community  Patient Stress Factors Health changes  Family Stress Factors None identified  Interventions  Spiritual Care Interventions Made Compassionate presence;Reflective listening;Prayer  Intervention Outcomes  Outcomes Connection to spiritual care;Awareness of support;Reduced anxiety  Spiritual Care Plan  Spiritual Care Issues Still Outstanding Referring to oncoming chaplain for further support

## 2023-11-23 ENCOUNTER — Inpatient Hospital Stay (HOSPITAL_COMMUNITY): Payer: Medicare Other

## 2023-11-23 DIAGNOSIS — Z170 Estrogen receptor positive status [ER+]: Secondary | ICD-10-CM | POA: Diagnosis not present

## 2023-11-23 DIAGNOSIS — C50412 Malignant neoplasm of upper-outer quadrant of left female breast: Secondary | ICD-10-CM | POA: Diagnosis not present

## 2023-11-23 DIAGNOSIS — N179 Acute kidney failure, unspecified: Secondary | ICD-10-CM | POA: Diagnosis not present

## 2023-11-23 LAB — COMPREHENSIVE METABOLIC PANEL
ALT: 568 U/L — ABNORMAL HIGH (ref 0–44)
AST: 88 U/L — ABNORMAL HIGH (ref 15–41)
Albumin: 2.2 g/dL — ABNORMAL LOW (ref 3.5–5.0)
Alkaline Phosphatase: 122 U/L (ref 38–126)
Anion gap: 11 (ref 5–15)
BUN: 102 mg/dL — ABNORMAL HIGH (ref 8–23)
CO2: 17 mmol/L — ABNORMAL LOW (ref 22–32)
Calcium: 7.7 mg/dL — ABNORMAL LOW (ref 8.9–10.3)
Chloride: 111 mmol/L (ref 98–111)
Creatinine, Ser: 2.37 mg/dL — ABNORMAL HIGH (ref 0.44–1.00)
GFR, Estimated: 19 mL/min — ABNORMAL LOW (ref >60)
Glucose, Bld: 74 mg/dL (ref 70–99)
Potassium: 3.1 mmol/L — ABNORMAL LOW (ref 3.5–5.1)
Sodium: 139 mmol/L (ref 135–145)
Total Bilirubin: 1.5 mg/dL — ABNORMAL HIGH (ref 0.0–1.2)
Total Protein: 4.1 g/dL — ABNORMAL LOW (ref 6.5–8.1)

## 2023-11-23 LAB — CBC
HCT: 29.9 % — ABNORMAL LOW (ref 36.0–46.0)
Hemoglobin: 9.8 g/dL — ABNORMAL LOW (ref 12.0–15.0)
MCH: 33.2 pg (ref 26.0–34.0)
MCHC: 32.8 g/dL (ref 30.0–36.0)
MCV: 101.4 fL — ABNORMAL HIGH (ref 80.0–100.0)
Platelets: 154 10*3/uL (ref 150–400)
RBC: 2.95 MIL/uL — ABNORMAL LOW (ref 3.87–5.11)
RDW: 15.4 % (ref 11.5–15.5)
WBC: 5.2 10*3/uL (ref 4.0–10.5)
nRBC: 0 % (ref 0.0–0.2)

## 2023-11-23 MED ORDER — SIMETHICONE 80 MG PO CHEW
80.0000 mg | CHEWABLE_TABLET | Freq: Once | ORAL | Status: AC
  Administered 2023-11-23: 80 mg via ORAL
  Filled 2023-11-23: qty 1

## 2023-11-23 MED ORDER — LACTATED RINGERS IV SOLN: INTRAVENOUS | Status: AC

## 2023-11-23 MED ORDER — POTASSIUM CHLORIDE CRYS ER 20 MEQ PO TBCR
40.0000 meq | EXTENDED_RELEASE_TABLET | ORAL | Status: AC
  Administered 2023-11-23 (×2): 40 meq via ORAL
  Filled 2023-11-23 (×2): qty 2

## 2023-11-23 MED ORDER — IOHEXOL 300 MG/ML  SOLN
30.0000 mL | Freq: Once | INTRAMUSCULAR | Status: DC | PRN
Start: 2023-11-23 — End: 2023-12-04

## 2023-11-23 MED ORDER — ENSURE ENLIVE PO LIQD
237.0000 mL | Freq: Two times a day (BID) | ORAL | Status: DC
  Administered 2023-11-24 – 2023-12-02 (×9): 237 mL via ORAL

## 2023-11-23 NOTE — Plan of Care (Signed)

## 2023-11-23 NOTE — Plan of Care (Signed)
   Problem: Education: Goal: Knowledge of General Education information will improve Description Including pain rating scale, medication(s)/side effects and non-pharmacologic comfort measures Outcome: Progressing   Problem: Health Behavior/Discharge Planning: Goal: Ability to manage health-related needs will improve Outcome: Progressing

## 2023-11-23 NOTE — Progress Notes (Signed)
 [facility]  PROGRESS NOTE  [name] WUJ:[id number] DOB: 1934/10/04 DOA: 11/21/2023 PCP: Lorenda Ishihara, MD   Brief HPI:    89 y.o. 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      Assessment/Plan:    Acute renal failure-in the setting of CKD stage III, likely due to ATN from hypotension and dehydration. -Started on LR at 50 mL/h -Creatinine improved to 2.37 -Follow BMP in am - Renal ultrasound shows moderate hydronephrosis;   -CT abdomen/pelvis without contrast ordered; will follow result  History of hypertension -Imdur, amlodipine, Coreg on hold due to hypotension  Breast cancer with hepatic and spinal metastasis -Oncology following  Abnormal LFTs -Secondary to hepatic metastasis  Cancer-related pain -Continue oxycodone, IV morphine as needed  Constipation -Patient takes oxycodone at home -Fleets enema ordered x 1, -Having multiple bowel movements today   Hypokalemia -Replace potassium and follow BMP in am  Medications     heparin  5,000 Units Subcutaneous Q8H   polyethylene glycol  17 g Oral Daily   potassium chloride  40 mEq Oral Q4H   senna-docusate  2 tablet Oral BID   sodium bicarbonate  650 mg Oral TID     Data Reviewed:   CBG:  No results for input(s): "GLUCAP" in the last 168 hours.  SpO2: 99 %    Vitals:   11/22/23 1404 11/22/23 2043 11/23/23 0435 11/23/23 1419  BP: (!) 142/42 (!) 143/54 (!) 143/50 (!) 146/48  Pulse: 79 81 68 74  Resp: 18 17 16 20   Temp: 97.9 F (36.6 C) 97.6 F (36.4 C) (!) 97.5 F (36.4 C) (!) 97.4 F (36.3 C)  TempSrc: Oral Oral Oral Oral  SpO2: 95% 97% 95% 99%  Weight:      Height:          Data Reviewed:  Basic Metabolic Panel: Recent Labs  Lab 11/21/23 0840 11/21/23 1611 11/22/23 0802 11/23/23 0648  NA 139  135 139 139  K 4.4 4.4 3.8 3.1*  CL 111 113* 112* 111  CO2 19* 15* 17* 17*  GLUCOSE 189* 156* 80 74  BUN >100* 119* 104* 102*  CREATININE 2.93* 3.07* 2.68* 2.37*  CALCIUM 8.2* 7.9* 7.9* 7.7*    CBC: Recent Labs  Lab 11/21/23 0840 11/22/23 0802 11/23/23 0648  WBC 4.6 4.9 5.2  NEUTROABS 4.1  --   --   HGB 10.0* 9.9* 9.8*  HCT 30.1* 30.8* 29.9*  MCV 100.3* 103.7* 101.4*  PLT 198 172 154    LFT Recent Labs  Lab 11/21/23 0840 11/23/23 0648  AST 84* 88*  ALT 421* 568*  ALKPHOS 110 122  BILITOT 0.7 1.5*  PROT 4.1* 4.1*  ALBUMIN 2.6* 2.2*     Antibiotics: Anti-infectives (From admission, onward)    None        DVT prophylaxis: Heparin  Code Status: DNR  Family Communication:    CONSULTS    Subjective    Denies any complaints.  Objective    Physical Examination:   General-appears in no acute distress Heart-S1-S2, regular, no murmur auscultated Lungs-clear to auscultation bilaterally, no wheezing or crackles auscultated Abdomen-soft, nontender, no organomegaly Extremities-no edema in the lower extremities Neuro-alert, oriented x3, no focal deficit noted  Status is: Inpatient:             Meredeth Ide   [facility] If  7PM-7AM, please contact night-coverage at [email or web address], Office  [phone]   11/23/2023, 4:04 PM  LOS: 2 days

## 2023-11-23 NOTE — Progress Notes (Signed)
   11/23/23 1156  TOC Brief Assessment  Insurance and Status Reviewed  Patient has primary care physician Yes  Home environment has been reviewed single family home; alone  Prior level of function: independent  Prior/Current Home Services No current home services  Social Drivers of Health Review SDOH reviewed no interventions necessary  Readmission risk has been reviewed Yes  Transition of care needs no transition of care needs at this time

## 2023-11-24 ENCOUNTER — Ambulatory Visit: Payer: Medicare Other

## 2023-11-24 DIAGNOSIS — I4891 Unspecified atrial fibrillation: Secondary | ICD-10-CM

## 2023-11-24 DIAGNOSIS — N179 Acute kidney failure, unspecified: Secondary | ICD-10-CM | POA: Diagnosis not present

## 2023-11-24 DIAGNOSIS — I2489 Other forms of acute ischemic heart disease: Secondary | ICD-10-CM | POA: Insufficient documentation

## 2023-11-24 DIAGNOSIS — R072 Precordial pain: Secondary | ICD-10-CM

## 2023-11-24 DIAGNOSIS — R7989 Other specified abnormal findings of blood chemistry: Secondary | ICD-10-CM

## 2023-11-24 DIAGNOSIS — D649 Anemia, unspecified: Secondary | ICD-10-CM | POA: Insufficient documentation

## 2023-11-24 DIAGNOSIS — I422 Other hypertrophic cardiomyopathy: Secondary | ICD-10-CM

## 2023-11-24 DIAGNOSIS — C50412 Malignant neoplasm of upper-outer quadrant of left female breast: Secondary | ICD-10-CM | POA: Diagnosis not present

## 2023-11-24 DIAGNOSIS — C569 Malignant neoplasm of unspecified ovary: Secondary | ICD-10-CM | POA: Insufficient documentation

## 2023-11-24 HISTORY — DX: Unspecified atrial fibrillation: I48.91

## 2023-11-24 LAB — COMPREHENSIVE METABOLIC PANEL
ALT: 382 U/L — ABNORMAL HIGH (ref 0–44)
AST: 37 U/L (ref 15–41)
Albumin: 2.3 g/dL — ABNORMAL LOW (ref 3.5–5.0)
Alkaline Phosphatase: 111 U/L (ref 38–126)
Anion gap: 10 (ref 5–15)
BUN: 98 mg/dL — ABNORMAL HIGH (ref 8–23)
CO2: 15 mmol/L — ABNORMAL LOW (ref 22–32)
Calcium: 8 mg/dL — ABNORMAL LOW (ref 8.9–10.3)
Chloride: 114 mmol/L — ABNORMAL HIGH (ref 98–111)
Creatinine, Ser: 2.15 mg/dL — ABNORMAL HIGH (ref 0.44–1.00)
GFR, Estimated: 22 mL/min — ABNORMAL LOW (ref >60)
Glucose, Bld: 116 mg/dL — ABNORMAL HIGH (ref 70–99)
Potassium: 2.9 mmol/L — ABNORMAL LOW (ref 3.5–5.1)
Sodium: 139 mmol/L (ref 135–145)
Total Bilirubin: 0.9 mg/dL (ref 0.0–1.2)
Total Protein: 4.2 g/dL — ABNORMAL LOW (ref 6.5–8.1)

## 2023-11-24 LAB — CBC
HCT: 30.1 % — ABNORMAL LOW (ref 36.0–46.0)
Hemoglobin: 9.7 g/dL — ABNORMAL LOW (ref 12.0–15.0)
MCH: 32.7 pg (ref 26.0–34.0)
MCHC: 32.2 g/dL (ref 30.0–36.0)
MCV: 101.3 fL — ABNORMAL HIGH (ref 80.0–100.0)
Platelets: 163 10*3/uL (ref 150–400)
RBC: 2.97 MIL/uL — ABNORMAL LOW (ref 3.87–5.11)
RDW: 15.4 % (ref 11.5–15.5)
WBC: 5.2 10*3/uL (ref 4.0–10.5)
nRBC: 0 % (ref 0.0–0.2)

## 2023-11-24 LAB — MAGNESIUM: Magnesium: 1.9 mg/dL (ref 1.7–2.4)

## 2023-11-24 LAB — TSH: TSH: 1.66 u[IU]/mL (ref 0.350–4.500)

## 2023-11-24 LAB — T4, FREE: Free T4: 0.65 ng/dL (ref 0.61–1.12)

## 2023-11-24 LAB — TROPONIN I (HIGH SENSITIVITY)
Troponin I (High Sensitivity): 54 ng/L — ABNORMAL HIGH (ref <18)
Troponin I (High Sensitivity): 50 ng/L — ABNORMAL HIGH (ref <18)

## 2023-11-24 MED ORDER — CARVEDILOL 25 MG PO TABS
25.0000 mg | ORAL_TABLET | Freq: Two times a day (BID) | ORAL | Status: DC
  Administered 2023-11-24 – 2023-12-04 (×21): 25 mg via ORAL
  Filled 2023-11-24 (×21): qty 1

## 2023-11-24 MED ORDER — DILTIAZEM HCL-DEXTROSE 125-5 MG/125ML-% IV SOLN (PREMIX)
5.0000 mg/h | INTRAVENOUS | Status: DC
  Filled 2023-11-24: qty 125

## 2023-11-24 MED ORDER — MORPHINE SULFATE (PF) 2 MG/ML IV SOLN
1.0000 mg | INTRAVENOUS | Status: DC | PRN
  Administered 2023-11-24 (×2): 1 mg via INTRAVENOUS
  Filled 2023-11-24: qty 1

## 2023-11-24 MED ORDER — DILTIAZEM HCL-DEXTROSE 125-5 MG/125ML-% IV SOLN (PREMIX)
5.0000 mg/h | INTRAVENOUS | Status: DC
  Administered 2023-11-24 – 2023-11-25 (×2): 5 mg/h via INTRAVENOUS
  Filled 2023-11-24 (×2): qty 125

## 2023-11-24 MED ORDER — CHLORHEXIDINE GLUCONATE CLOTH 2 % EX PADS
6.0000 | MEDICATED_PAD | Freq: Every day | CUTANEOUS | Status: DC
  Administered 2023-11-24 – 2023-12-04 (×4): 6 via TOPICAL

## 2023-11-24 MED ORDER — ISOSORBIDE MONONITRATE ER 60 MG PO TB24
60.0000 mg | ORAL_TABLET | Freq: Every day | ORAL | Status: DC
  Administered 2023-11-24 – 2023-11-26 (×3): 60 mg via ORAL
  Filled 2023-11-24 (×4): qty 1

## 2023-11-24 MED ORDER — NITROGLYCERIN 0.4 MG SL SUBL
0.4000 mg | SUBLINGUAL_TABLET | SUBLINGUAL | Status: DC | PRN
Start: 2023-11-24 — End: 2023-12-04
  Administered 2023-11-24 – 2023-12-02 (×3): 0.4 mg via SUBLINGUAL
  Filled 2023-11-24 (×4): qty 1

## 2023-11-24 MED ORDER — DILTIAZEM LOAD VIA INFUSION
10.0000 mg | Freq: Once | INTRAVENOUS | Status: AC
  Administered 2023-11-24: 10 mg via INTRAVENOUS
  Filled 2023-11-24: qty 10

## 2023-11-24 MED ORDER — POTASSIUM CHLORIDE 10 MEQ/100ML IV SOLN
10.0000 meq | INTRAVENOUS | Status: AC
  Administered 2023-11-24 (×3): 10 meq via INTRAVENOUS
  Filled 2023-11-24 (×3): qty 100

## 2023-11-24 MED ORDER — ALUM & MAG HYDROXIDE-SIMETH 200-200-20 MG/5ML PO SUSP
30.0000 mL | Freq: Once | ORAL | Status: AC
  Administered 2023-11-24: 30 mL via ORAL
  Filled 2023-11-24: qty 30

## 2023-11-24 MED ORDER — MORPHINE SULFATE (PF) 2 MG/ML IV SOLN
INTRAVENOUS | Status: AC
  Filled 2023-11-24: qty 1

## 2023-11-24 NOTE — Progress Notes (Addendum)
 [name]   DOB:1933/12/13   HY#:[id number]      ASSESSMENT & PLAN:  1.  Breast cancer with mets to liver, spine, peritoneum -Initially diagnosed with right-sided breast cancer in 2010.  She is status postlumpectomy, right breast RT and anastrozole which was completed in 2015. 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  Subsequently had a left breast lumpectomy with radioactive seed localization in 2020. -More recently, she is status post Faslodex and abemaciclib 2 weeks ago.  This is currently on hold. - Consideration for continuing oncologic systemic therapy versus palliative care.  Dr. Truett Perna will continue discussions with patient and family. - Status post radiation therapy, last dose 11/18/2023 - Follows with medical oncology/Dr. Truett Perna  2.  Severe back pain/abdominal pain - Now admitted with severe back pain and abdominal pain - Continue judicious pain management  3.  Failure to thrive/dehydration Constipation - Patient is now admitted with failure to thrive, dehydration, and constipation. - Reports constipation is much better.  Rectal tube now in place.  4.  Anemia, macrocytic - Hemoglobin stable 9.7 with MCV 101.3 - Likely multifactorial - No transfusional intervention warranted at this time - Will continue to monitor CBC with differential  5.  AKI - Elevated creatinine 2.15 with elevated BUN 98 - Continue gentle hydration  6.  History of ovarian CA - In 2009.  No recurrence to date.  7.  New onset A-fib -Elevated troponin - Seen by cardiology, not a candidate for [facility].   Code Status DNR-limited   Subjective:  Patient seen awake and alert laying supine in bed.  She is ill-appearing and pale.  Patient's daughter is at bedside.  Reports ongoing back pain which is somewhat controlled with pain medications and is better when she is off her feet.  States abdominal pain is much better.  No other acute distress is noted.  Objective:  Vitals:   11/24/23 0900  11/24/23 1025  BP: 125/62 (!) 135/45  Pulse:  (!) 112  Resp:  13  Temp:  (!) 97.4 F (36.3 C)  SpO2:  100%     Intake/Output Summary (Last 24 hours) at 11/24/2023 1036 Last data filed at 11/24/2023 1007 Gross per 24 hour  Intake 1115.04 ml  Output 745 ml  Net 370.04 ml     REVIEW OF SYSTEMS:   Constitutional: + Fatigue, + back pain, + improving abdominal pain  Eyes: Denies blurriness of vision, double vision or watery eyes Ears, nose, mouth, throat, and face: Denies mucositis or sore throat Respiratory: Denies cough, dyspnea or wheezes Cardiovascular: Denies palpitation, chest discomfort or lower extremity swelling Gastrointestinal:  Denies nausea, heartburn or change in bowel habits Skin: Denies abnormal skin rashes Lymphatics: Denies new lymphadenopathy or easy bruising Neurological: Denies numbness, tingling or new weaknesses Behavioral/Psych: Mood is stable, no new changes  All other systems were reviewed with the patient and are negative.  PHYSICAL EXAMINATION: ECOG PERFORMANCE STATUS: 3 - Symptomatic, >50% confined to bed  Vitals:   11/24/23 0900 11/24/23 1025  BP: 125/62 (!) 135/45  Pulse:  (!) 112  Resp:  13  Temp:  (!) 97.4 F (36.3 C)  SpO2:  100%   Filed Weights   11/21/23 1525  Weight: 117 lb 15.1 oz (53.5 kg)    GENERAL: alert, + ill-appearing + frail SKIN: + Pale skin color, texture, turgor are normal, no rashes or significant lesions EYES: normal, conjunctiva are pink and non-injected, sclera clear OROPHARYNX: no exudate, no erythema and lips, buccal  mucosa, and tongue normal  NECK: supple, thyroid normal size, non-tender, without nodularity LYMPH: no palpable lymphadenopathy in the cervical, axillary or inguinal LUNGS: clear to auscultation and percussion with normal breathing effort HEART: regular rate & rhythm and no murmurs and no lower extremity edema ABDOMEN: abdomen soft, non-tender and normal bowel sounds MUSCULOSKELETAL: no cyanosis of  digits and no clubbing  PSYCH: alert & oriented x 3 with fluent speech NEURO: no focal motor/sensory deficits   All questions were answered. The patient knows to call the clinic with any problems, questions or concerns.   The total time spent in the appointment was 55 minutes encounter with patient including review of chart and various tests results, discussions about plan of care and coordination of care plan  Dawson Bills, NP 11/24/2023 10:36 AM    Labs Reviewed:  Lab Results  Component Value Date   WBC 5.2 11/24/2023   HGB 9.7 (L) 11/24/2023   HCT 30.1 (L) 11/24/2023   MCV 101.3 (H) 11/24/2023   PLT 163 11/24/2023   Recent Labs    11/21/23 0840 11/21/23 1611 11/22/23 0802 11/23/23 0648 11/24/23 0711  NA 139   < > 139 139 139  K 4.4   < > 3.8 3.1* 2.9*  CL 111   < > 112* 111 114*  CO2 19*   < > 17* 17* 15*  GLUCOSE 189*   < > 80 74 116*  BUN >100*   < > 104* 102* 98*  CREATININE 2.93*   < > 2.68* 2.37* 2.15*  CALCIUM 8.2*   < > 7.9* 7.7* 8.0*  GFRNONAA 15*   < > 17* 19* 22*  PROT 4.1*  --   --  4.1* 4.2*  ALBUMIN 2.6*  --   --  2.2* 2.3*  AST 84*  --   --  88* 37  ALT 421*  --   --  568* 382*  ALKPHOS 110  --   --  122 111  BILITOT 0.7  --   --  1.5* 0.9   < > = values in this interval not displayed.    Studies Reviewed:  CT ABDOMEN PELVIS WO CONTRAST Result Date: 11/23/2023 CLINICAL DATA:  Metastatic breast cancer. Acute abdominal pain. Acute kidney injury. * Tracking Code: BO * EXAM: CT ABDOMEN AND PELVIS WITHOUT CONTRAST TECHNIQUE: Multidetector CT imaging of the abdomen and pelvis was performed following the standard protocol without IV contrast. RADIATION DOSE REDUCTION: This exam was performed according to the departmental dose-optimization program which includes automated exposure control, adjustment of the mA and/or kV according to patient size and/or use of iterative reconstruction technique. COMPARISON:  10/16/2023 FINDINGS: Lower chest: Right coronary  artery descending thoracic aortic atheromatous vascular calcification. Trace pericardial effusion. Mitral valve calcification. Small bilateral pleural effusions with passive atelectasis. Small type 1 hiatal hernia. Scattered small pulmonary nodules in the lung bases as on 10/16/2023, probably from metastatic disease. Hepatobiliary: Scattered hepatic hypodense lesions compatible with metastatic disease. A confluence of two lesions posteriorly in the right hepatic lobe measures 4.1 by 2.2 cm on image 8 series 2. A medial hypodense lesion posteriorly in the right hepatic lobe measures 4.4 by 3.1 cm on image 10 series 2, formerly about 4.0 by 2.7 cm. Cholecystectomy. Distal common bile duct proximally 9 mm in diameter, borderline prominent for age. Pancreas: Atrophic, otherwise unremarkable. Spleen: Unremarkable Adrenals/Urinary Tract: 2.0 by 2.0 cm right adrenal mass, internal density 34 Hounsfield units which is indeterminate. Bilateral nonobstructive renal calculi. Mild right  hydronephrosis and proximal hydroureter extending to the iliac vessel cross over, however with no distal hydroureter. Foley catheter in the urinary bladder which is mostly empty although there is a small amount of gas in the lumen of the urinary bladder. Stomach/Bowel: Small periampullary duodenal diverticulum. Large caliber distal colon and rectum containing progressive dilution of contrast medium as well as stool contents. Prior bowel resections include the right colon and at least part of the ileum. Vascular/Lymphatic: Atherosclerosis is present, including aortoiliac atherosclerotic disease. Reproductive: Uterus absent.  Adnexa unremarkable. Other: No supplemental non-categorized findings. Musculoskeletal: We partially include what appears to be a compression fracture and possible lytic lesions involving the T11 vertebral body. Suspected right eleventh rib fracture medially. Lucency eccentric to the left in the T12 vertebral body may be from  metastatic disease. Bony destructive findings in the posterior elements at L4 suspicious for osseous metastatic disease. Grade 1 anterolisthesis at L5-S1. Lucency in the left iliac wing on image 53 series 4 is probably a metastatic lesion. There is also lucency further medially in the left iliac bone on image 77 series 4. IMPRESSION: 1. Mild right hydronephrosis and proximal hydroureter extending to the iliac vessel cross over, however with no distal hydroureter. This could be from a distal ureteral stricture or from a recently passed calculus. 2. Bilateral nonobstructive renal calculi. 3. Scattered hepatic hypodense lesions compatible with metastatic disease. 4. Scattered small pulmonary nodules in the lung bases as on 10/16/2023, probably from metastatic disease. 5. 2.0 by 2.0 cm right adrenal mass, internal density 34 Hounsfield units which is indeterminate but quite likely metastatic. 6. We partially include what appears to be a compression fracture and possible lytic lesions involving the T11 vertebral body. Suspected right eleventh rib fracture medially. Bony destructive findings in the posterior elements at L4 suspicious for osseous metastatic disease. Left iliac lytic lesions suspicious for metastatic disease. 7. Small bilateral pleural effusions with passive atelectasis. 8. Small type 1 hiatal hernia. 9. Aortic and coronary atherosclerosis. Aortic Atherosclerosis (ICD10-I70.0). Electronically Signed   By: Gaylyn Rong M.D.   On: 11/23/2023 17:51   US RENAL Result Date: 11/21/2023 CLINICAL DATA:  Acute kidney injury. EXAM: RENAL / URINARY TRACT ULTRASOUND COMPLETE COMPARISON:  CT 10/16/2023 FINDINGS: Right Kidney: Renal measurements: 10.1 x 4.4 x 4.7 cm = volume: 110 mL. Moderate hydronephrosis is new from prior CT. Mild increased renal parenchymal echogenicity typical of chronic renal disease. Renal calculi on prior CT not well-defined on the current exam. No evidence of focal lesion. Left Kidney:  Renal measurements: 9 x 4.1 x 4.3 cm = volume: 83 mL. No hydronephrosis. Thinning of renal parenchyma with increased echogenicity. Shadowing 9 mm stone. Small renal cysts, largest 1.7 cm. No evidence of suspicious lesion. Bladder: Appears normal for degree of bladder distention. Other: None. IMPRESSION: 1. Moderate right hydronephrosis is new from prior CT. 2. Increased renal parenchymal echogenicity typical of chronic medical renal disease. 3. Left renal stone. Known right renal calculi on prior CT not well seen. Electronically Signed   By: Narda Rutherford M.D.   On: 11/21/2023 22:06   US BIOPSY (LIVER) Result Date: 10/30/2023 INDICATION: History of prior left breast carcinoma. Imaging evidence metastatic disease in the liver, multiple skeletal sites and possibly lungs. The patient presents for liver lesion biopsy. EXAM: ULTRASOUND GUIDED CORE BIOPSY OF LIVER MEDICATIONS: None. ANESTHESIA/SEDATION: Moderate (conscious) sedation was employed during this procedure. A total of Versed 1.0 mg and Fentanyl 50 mcg was administered intravenously. Moderate Sedation Time: 15 minutes. The patient's  level of consciousness and vital signs were monitored continuously by radiology nursing throughout the procedure under my direct supervision. PROCEDURE: The procedure, risks, benefits, and alternatives were explained to the patient. Questions regarding the procedure were encouraged and answered. The patient understands and consents to the procedure. A time-out was performed prior to initiating the procedure. The abdominal wall was prepped with exiting in a sterile fashion, and a sterile drape was applied covering the operative field. A sterile gown and sterile gloves were used for the procedure. Local anesthesia was provided with 1% Lidocaine. Ultrasound was performed to localize liver lesions. A 17 gauge trocar needle was advanced into the liver at the level of a lesion within the right lobe. Three separate coaxial 18 gauge  core biopsy samples were obtained and submitted in formalin. Additional ultrasound was performed after removal of the outer needle. COMPLICATIONS: None immediate. FINDINGS: Multiple rounded hypoechoic mass lesions are seen throughout the liver parenchyma. A lesion within the medial right lobe was chosen for sampling measuring approximately 2.5 x 2.2 x 2.5 cm. Solid tissue was obtained. IMPRESSION: Ultrasound-guided core biopsy performed of a mass lesion within the right lobe of the liver measuring 2.5 cm in maximum diameter. Electronically Signed   By: Irish Lack M.D.   On: 10/30/2023 11:42   [name] was interviewed and examined.  She developed acute chest pain this morning.  She has new onset atrial fibrillation.  She has been evaluated by cardiology.  She reports improvement in abdominal pain after relief of constipation.  She has no back pain at present.  She has acute renal failure.  A CT and ultrasound revealed right hydronephrosis.  Recommendations: Management of atrial fibrillation and chest pain per the cardiology service Keep Faslodex and Abemaciclib on hold Continue intravenous hydration, follow-up BUN and creatinine consider urology consult for ureter stent placement I will continue discussions with [name] and her family regarding continuing treatment of the breast cancer versus hospice care

## 2023-11-24 NOTE — Progress Notes (Signed)
 Pt has complaints of suddenly left chest pain scale 8-9/10, heart palpitations and lethargic. BP 168/96-172/78 mmHg, HR 110-130. SPO2 98% on room air. EKG 12 leads stat shows Afib with RVR. Dr. Sharl Ma and RRT Sarah notified.  Dilaudid 0.5 mg IV for chest pain, NTG sublingual and O2 NCL 2 LPM given. Pt will transfer to the progressive care level for Cardizem gtt.    11/24/23 0701 11/24/23 0706  Provider Notification  Provider Name/Title RRT, Sarah Dr. Mauro Kaufmann. MD  Date Provider Notified 11/24/23 11/24/23  Time Provider Notified [phone] 0715  Method of Notification Face-to-face Page  Notification Reason Change in status Change in status (A fib, RVR)  Provider response Evaluate remotely;See new orders Evaluate remotely  Date of Provider Response 11/24/23 11/24/23  Time of Provider Response [phone]    Filiberto Pinks, RN

## 2023-11-24 NOTE — Progress Notes (Signed)
 Pt transferred to 1403. Assesesment completed. Edema noted to LUE. PIV to LUE infusing potassium and LR. PT reports pain to LUE not related to PIV. IV stopped. Upon reading MD progress notes in chart, RN noted pt being status post a radioactive seed localized lumpectomy 06/11/2019. Therefore PIV to LUE removed. Limb restriction arm band placed. Dr Sharl Ma notified via secure chat.

## 2023-11-24 NOTE — Progress Notes (Addendum)
   11/24/23 0152  Provider Notification  Provider Name/Title Dr. Chinita Greenland, ARPN  Date Provider Notified 11/24/23  Time Provider Notified 0153  Method of Notification Page  Notification Reason Change in status. Pt has bowel incontinent with multiple watery diarrhea q 10- 15 minutes. Her skin around rectal area is red and painful. We requested for fecal management with rectal Flaxiseal.     Provider response See new orders;Evaluate remotely  Date of Provider Response 11/24/23  Time of Provider Response 0201   The rectal tube was inserted successfully with no difficulties. We will monitor.  Filiberto Pinks, RN

## 2023-11-24 NOTE — Consult Note (Addendum)
 Cardiology Consultation   Patient ID: [name] MRN: [id number]; DOB: 26-Sep-1934  Admit date: 11/21/2023 Date of Consult: 11/24/2023  PCP:  Lorenda Ishihara, MD   [facility] Providers Cardiologist:  Donato Schultz, MD       Chief complaint: Back pain and abdominal pain. Reason of consult new onset of A-fib with RVR  Patient Profile:   [name] is a 89 y.o. female with a hx of breast cancer with metastasis to liver, peritoneum, and spine, prior ovarian cancer, moderate CAD with chronic episodic chest tightness, HTN, diastolic dysfunction (grade 3 by echo 2023)/probable chronic HFpEF by chart, normal renal artery duplex 2017, HLD, mildly elevated PASP/mild MR by echo 2023, GERD, kidney stones, carotid artery disease (1-39% BICA 03/2022 with >50% L CCA), PACs/atrial bigeminy, essential thrombocytosis who is being seen 11/24/2023 for the evaluation of AF RVR at the request of Dr. Sharl Ma.  History of Present Illness:   Ms. Sickman was previously followed by Dr. Katrinka Blazing then more recently Dr. Anne Fu. Remote cath 2007 showed moderate LAD disease with 50% narrowing after D2, 50% prox D2, 30-40% PDA, irregularities in mid RCA, elevated LVEDP with suspected diastolic dysfunction. She has had episodic chest tightness in outpatient visits. Stress test 2021 showed small fixed defect in anteroapex and mid anterior wall at stress and rest question breast attenuation artifact, cannot exclude small area of prior infarct, low risk study without ischemia, managed medically. Echo 03/2022 showed EF 65-70%, G3DD, mild LVH of basal septal segment, mildly elevated PASP, severe LAE, mild RAE, mild MR. At last OV 02/2023, Dr. Anne Fu indicated she had h/o suspected microvascular disease, but also raised question of GERD/MSK contributing to chest discomfort. CHF is listed in prior problem lists, suspect HFpEF without prior need for loop diuretic.  She has metastatic breast cancer. She has been  treated with systemic therapy and palliative radiation. She saw Dr. Truett Perna 2/14 with complaints of continued back pain, constipation, anterior chest discomfort when swallowing, limited oral intake, and oral ulcers. She has had severe abdominal pain, distension, and nausea. She was felt to be dehydrated. Clinic BP 98/48. She was admitted to the hospital for AKI with peak Cr 3.07, also found to have hypokalemia, hypocalcemia (corrects to normal), macrocytic anemia with Hgb 9's (previously 12-13's), elevated LFTs. CT a/p showed mild right hydronephrosis, bilateral nonobstructing kidney stones, scattered hepatic metastases, pulmonary nodules felt probably metastatic disease, right adrenal mass felt likely metastatic, bony/destructive lesions felt likely metastases, small bilateral pleural effusions, small type 1 hiatal hernia, aortic/coronary atherosclerosis. Home BP meds were held (carvedilol, chlorthalidone, amlodipine, hydralazine, Imdur). She's been treated with IV fluids, potassium suplpementation, laxative and pain medication.  This morning she developed acute onset of severe left sided chest pain/pressure with palpitations, noted to be in AF RVR 110-130bpm with elevated BP 162/78. She received SL NTG, IV dilaudid, IV morphine. She was also restarted on carvedilol 25mg  BID and Imdur 60mg . Subsequent SBPs 130s/40s. She was not on telemetry earlier this admission but HR previously 60s-70s. IV diltiazem drip is planned but not yet started as she was in the process of transferring from 6th floor to 4th floor. Her HR is currently in the 90s-low 100s at rest, but does increase to 120s-130 with being repositioned in bed. She has also had some SOB with this. She can still feel some chest pressure. EKG shows coarse AFib vs flutter with ST depressions inferiorly, V1, V4-V6. hsTroponin 54.    Past Medical History:  Diagnosis Date  Arthritis    HANDS AND BACK   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    Breast cancer (HCC) 2010   RIGHT   CAD (coronary artery disease)    Dysuria    Family history of breast cancer    GERD (gastroesophageal reflux disease)    HX OF   Heart murmur    History of blood transfusion    History of breast cancer ONCOLOGIST-- DR Truett Perna   DX 2010--  RIGHT BREAST DCIS  HIGH GRADE S/P LUMPECTOMY W/ SLN BX--  NO RECURRENCE   History of kidney stones    History of ovarian cancer    2009--  S/P COLON RESECTION FOR PAPILLARY SEROUS CARCINOMA, BORDERLINE OVARIAN CANCER VERSUS PRIMARY PERITONEAL CARCINOMA WITH PSAMMOMA BODIES--  NO RECURRENCE   History of skin cancer    excision basal cell   Hyperlipidemia    Hypertension    Kidney stones 7/14   Nocturia    Ovarian carcinoma (HCC)    papillary serous-low grade, recurrence found in fat necrosis during ileostomy   Renal stones 2014   Shingles 30 YRS AGO   Ureteral calculi    bilateral    Past Surgical History:  Procedure Laterality Date   APPENDECTOMY     BREAST LUMPECTOMY Right 04-13-2009   W/ SLN BX   BREAST LUMPECTOMY WITH RADIOACTIVE SEED LOCALIZATION Left 06/11/2019   Procedure: LEFT BREAST LUMPECTOMY WITH RADIOACTIVE SEED LOCALIZATION;  Surgeon: Griselda Miner, MD;  Location: [facility];  Service: General;  Laterality: Left;   CARDIAC CATHETERIZATION  11-11-2005 DR Verdis Prime   MODERATE LAD DISEASE/ NORMAL LVF/ EF 65-75%   CATARACT EXTRACTION W/ INTRAOCULAR LENS IMPLANT Bilateral 04/26/2013   CHOLECYSTECTOMY  1990   OPEN   COLONOSCOPY     CYSTOSCOPY W/ URETERAL STENT PLACEMENT Bilateral 05/07/2013   Procedure: CYSTOSCOPY WITH STENT REPLACEMENTS;  Surgeon: Sebastian Ache, MD;  Location: [facility];  Service: Urology;  Laterality: Bilateral;   CYSTOSCOPY WITH BIOPSY N/A 04/07/2013   Procedure: CYSTOSCOPY WITH BIOPSY and fulgeration;  Surgeon: Sebastian Ache, MD;  Location: [facility];  Service: Urology;  Laterality: N/A;   CYSTOSCOPY WITH RETROGRADE PYELOGRAM, URETEROSCOPY AND STENT PLACEMENT  Bilateral 05/07/2013   Procedure: CYSTOSCOPY WITH RETROGRADE PYELOGRAM, URETEROSCOPY ;  Surgeon: Sebastian Ache, MD;  Location: [facility];  Service: Urology;  Laterality: Bilateral;   CYSTOSCOPY WITH RETROGRADE PYELOGRAM, URETEROSCOPY AND STENT PLACEMENT Bilateral 01/06/2019   Procedure: CYSTOSCOPY WITH RETROGRADE PYELOGRAM, URETEROSCOPY AND STENT PLACEMENT, FIRST STAGE;  Surgeon: Sebastian Ache, MD;  Location: [facility];  Service: Urology;  Laterality: Bilateral;   CYSTOSCOPY WITH RETROGRADE PYELOGRAM, URETEROSCOPY AND STENT PLACEMENT Bilateral 01/27/2019   Procedure: CYSTOSCOPY WITH RETROGRADE PYELOGRAM, URETEROSCOPY AND STENT PLACEMENT;  Surgeon: Sebastian Ache, MD;  Location: [facility];  Service: Urology;  Laterality: Bilateral;  75 MINS   CYSTOSCOPY WITH STENT PLACEMENT Bilateral 04/07/2013   Procedure: CYSTOSCOPY WITH STENT PLACEMENT;  Surgeon: Sebastian Ache, MD;  Location: [facility];  Service: Urology;  Laterality: Bilateral;   DX LAPAROSCOPY W/ PERITONEAL AND OMENTAL BX'S AND WASHINGS  02-16-2008   EXCISION RIGHT BREAST MASS  02-10-2002   EXP. LAP. EXTENSIVE ADHESIOLYSIS/ RESECTION TERMINAL ILEUM , ASCENDING AND DESCENDING COLON WITH CREATION ILEOSTOMY AND MUCOUS FISTULA  02-19-2008   PERFERATION AND ABD. CANCER--  TAKEDOWN ILEOSTOMY 08-23-2008   HOLMIUM LASER APPLICATION Bilateral 01/06/2019   Procedure: HOLMIUM LASER APPLICATION;  Surgeon: Sebastian Ache, MD;  Location: [facility];  Service: Urology;  Laterality: Bilateral;   HOLMIUM LASER  APPLICATION Bilateral 01/27/2019   Procedure: HOLMIUM LASER APPLICATION;  Surgeon: Sebastian Ache, MD;  Location: [facility];  Service: Urology;  Laterality: Bilateral;   I & D EXTREMITY Right 02/02/2022   Procedure: IRRIGATION AND DEBRIDEMENT HAND;  Surgeon: Bradly Bienenstock, MD;  Location: [facility];  Service: Orthopedics;  Laterality: Right;   ILEOSTOMY CLOSURE  08/2008   TOTAL ABDOMINAL HYSTERECTOMY W/ BILATERAL SALPINGOOPHORECTOMY  1976   TOTAL ABDOMINAL  HYSTERECTOMY W/ BILATERAL SALPINGOOPHORECTOMY  1976   VULVAR LESION REMOVAL N/A 08/22/2014   Procedure: EXCISION OF VULVAR CYST  ;  Surgeon: Annamaria Boots, MD;  Location: [facility];  Service: Gynecology;  Laterality: N/A;   WOUND DEBRIDEMENT  05/28/2012   Procedure: DEBRIDEMENT ABDOMINAL WOUND;  Surgeon: Currie Paris, MD;  Location: [facility];  Service: General;  Laterality: N/A;  excision chronic wound abdominal wall     Home Medications:  Prior to Admission medications   Medication Sig Start Date End Date Taking? Authorizing Provider  abemaciclib (VERZENIO) 100 MG tablet Take 1 tablet (100 mg total) by mouth 2 (two) times daily. 11/07/23  Yes Ladene Artist, MD  amLODipine (NORVASC) 10 MG tablet Take 1 tablet (10 mg total) by mouth daily. Patient taking differently: Take 10 mg by mouth at bedtime. 06/11/17  Yes Lyn Records, MD  aspirin EC 81 MG tablet Take 81 mg by mouth daily.   Yes [provider]  BELSOMRA 10 MG TABS Take 10 mg by mouth at bedtime. 01/20/19  Yes [provider]  Calcium Carbonate-Vitamin D (CALTRATE 600+D PO) Take 1 tablet by mouth 2 (two) times daily.   Yes [provider]  carvedilol (COREG) 25 MG tablet Take 1 tablet (25 mg total) by mouth 2 (two) times daily with a meal. 02/27/22  Yes Lyn Records, MD  chlorthalidone (HYGROTON) 25 MG tablet Take 0.5 tablets (12.5 mg total) by mouth daily. Patient taking differently: Take 25 mg by mouth daily. 02/15/21  Yes Lyn Records, MD  Cholecalciferol (VITAMIN D3) 50 MCG (2000 UT) TABS Take 2,000 Units by mouth in the morning and at bedtime.   Yes [provider]  cyanocobalamin (VITAMIN B12) 1000 MCG tablet Take 1,000 mcg by mouth daily.   Yes [provider]  dexamethasone (DECADRON) 4 MG tablet Take 1 tab (4 mg) twice a day for 3 days then take 1 tab daily Patient taking differently: Take 4 mg by mouth daily with breakfast. 10/21/23  Yes Rana Snare, NP   hydrALAZINE (APRESOLINE) 10 MG tablet Take 1 tablet (10 mg total) by mouth in the morning and at bedtime. Patient taking differently: Take 10 mg by mouth 3 (three) times daily. 05/31/21  Yes Lyn Records, MD  hydroxychloroquine (PLAQUENIL) 200 MG tablet Take 200 mg by mouth daily. 11/08/21  Yes [provider]  hydroxyurea (HYDREA) 500 MG capsule TAKE 1 CAPSULE BY MOUTH EVERY MONDAY, WEDNESDAY, AND FRIDAY. MAY TAKE WITH FOOD TO MINIMIZE GI SIDE EFFECTS 10/23/23  Yes Ladene Artist, MD  isosorbide mononitrate (IMDUR) 60 MG 24 hr tablet Take 1 tablet (60 mg total) by mouth daily. 05/16/23  Yes Jake Bathe, MD  Multiple Vitamins-Minerals (MULTIVITAMIN WOMEN 50+) TABS Take 1 tablet by mouth daily with breakfast.   Yes [provider]  oxyCODONE (OXY IR/ROXICODONE) 5 MG immediate release tablet Take 1 tablet (5 mg total) by mouth every 4 (four) hours as needed for severe pain (pain score 7-10) or moderate pain (pain score  4-6). May take 2 tablets if pain is severe.  Do not drive while taking pain medication. 11/18/23  Yes Rana Snare, NP  potassium chloride (KLOR-CON M) 10 MEQ tablet Take 1 tablet (10 meq) twice daily for 3 days, then 1 tablet daily. Patient taking differently: Take 10 mEq by mouth daily. 11/07/23  Yes Rana Snare, NP  PROCTOZONE-HC 2.5 % rectal cream PLACE 1 APPLICATION RECTALLY 2 TIMES A DAY AS NEEDED FOR HEMORRHOIDS. Patient taking differently: Place 1 application  rectally 2 (two) times daily as needed for hemorrhoids. 01/09/15  Yes Cross, Melissa D, NP  rosuvastatin (CRESTOR) 10 MG tablet Take 10 mg by mouth every other day.   Yes [provider]  tiZANidine (ZANAFLEX) 4 MG tablet Take 4 mg by mouth 2 (two) times daily as needed for muscle spasms. 10/10/23  Yes [provider]  triamcinolone (KENALOG) 0.1 % Apply 1 application  topically as needed (for rashes- affected areas). 04/04/20  Yes [provider]    Inpatient  Medications: Scheduled Meds:  carvedilol  25 mg Oral BID WC   Chlorhexidine Gluconate Cloth  6 each Topical Daily   diltiazem  10 mg Intravenous Once   feeding supplement  237 mL Oral BID BM   heparin  5,000 Units Subcutaneous Q8H   isosorbide mononitrate  60 mg Oral Daily   polyethylene glycol  17 g Oral Daily   senna-docusate  2 tablet Oral BID   Continuous Infusions:  diltiazem (CARDIZEM) infusion     PRN Meds: acetaminophen **OR** acetaminophen, albuterol, bisacodyl, HYDROmorphone (DILAUDID) injection, iohexol, lidocaine, morphine injection, nitroGLYCERIN, ondansetron **OR** ondansetron (ZOFRAN) IV, oxyCODONE, traZODone  Allergies:    Allergies  Allergen Reactions   Ramipril Itching    Social History:   Social History   Socioeconomic History   Marital status: Widowed    Spouse name: Not on file   Number of children: Not on file   Years of education: Not on file   Highest education level: Not on file  Occupational History   Not on file  Tobacco Use   Smoking status: Never   Smokeless tobacco: Never  Vaping Use   Vaping status: Never Used  Substance and Sexual Activity   Alcohol use: No   Drug use: No   Sexual activity: Never    Partners: Male    Birth control/protection: Surgical    Comment: Hysterectomy  Other Topics Concern   Not on file  Social History Narrative   Not on file   Social Drivers of Health   Financial Resource Strain: Not on file  Food Insecurity: No Food Insecurity (11/21/2023)   Hunger Vital Sign    Worried About Running Out of Food in the Last Year: Never true    Ran Out of Food in the Last Year: Never true  Transportation Needs: No Transportation Needs (11/21/2023)   PRAPARE - Administrator, Civil Service (Medical): No    Lack of Transportation (Non-Medical): No  Physical Activity: Not on file  Stress: Not on file  Social Connections: Moderately Integrated (11/21/2023)   Social Connection and Isolation Panel [NHANES]     Frequency of Communication with Friends and Family: More than three times a week    Frequency of Social Gatherings with Friends and Family: More than three times a week    Attends Religious Services: More than 4 times per year    Active Member of [employer] or Organizations: Yes    Attends Banker  Meetings: More than 4 times per year    Marital Status: Widowed  Intimate Partner Violence: Not At Risk (11/21/2023)   Humiliation, Afraid, Rape, and Kick questionnaire    Fear of Current or Ex-Partner: No    Emotionally Abused: No    Physically Abused: No    Sexually Abused: No    Family History:    Family History  Problem Relation Age of Onset   Heart disease Mother    Hyperlipidemia Mother    Breast cancer Sister    Cancer Sister        breast     ROS:  Please see the history of present illness.  All other ROS reviewed and negative.     Physical Exam/Data:   Vitals:   11/24/23 1025 11/24/23 1100 11/24/23 1200 11/24/23 1359  BP: (!) 135/45 131/61 (!) 125/47 138/63  Pulse: (!) 112 82 (!) 108   Resp: 13 14 19    Temp: (!) 97.4 F (36.3 C)   97.7 F (36.5 C)  TempSrc: Axillary   Axillary  SpO2: 100% 100% 100%   Weight:      Height:        Intake/Output Summary (Last 24 hours) at 11/24/2023 1546 Last data filed at 11/24/2023 1007 Gross per 24 hour  Intake 895.04 ml  Output 745 ml  Net 150.04 ml      11/21/2023    3:25 PM 11/21/2023    8:49 AM 11/11/2023    9:55 AM  Last 3 Weights  Weight (lbs) 117 lb 15.1 oz 113 lb 106 lb 8 oz  Weight (kg) 53.5 kg 51.256 kg 48.308 kg     Body mass index is 19.04 kg/m.  General: Frail elderly WF in no acute distress. Head: Normocephalic, atraumatic, sclera non-icteric, no xanthomas, nares are without discharge. Neck: Negative for carotid bruits. JVP not elevated. Lungs: Clear bilaterally to auscultation without wheezes, rales, or rhonchi. Breathing is unlabored. Heart: Irregularly irregular, tachycardic, 2/6 SEM without rubs or  gallops.  Abdomen: Soft, non-tender, non-distended with normoactive bowel sounds. No rebound/guarding. Extremities: No clubbing or cyanosis. No edema. Distal pedal pulses are 2+ and equal bilaterally. Neuro: Alert and oriented X 3. Moves all extremities spontaneously. Psych: Low volume of voice. Responds to questions appropriately with a normal affect.   EKG:  The EKG was personally reviewed and demonstrates:    1) Coarse atrial fib vs atrial flutter 126bpm with ST depression inferiorly as well as I, V4-V6  2) Coarse afib vs flutter with occasional PVCs 121bpm with ST depression I, II, avL, V4-V6  She had previous baseline ST sagging in these leads previously but accentuated from prior.  Telemetry:  Telemetry was personally reviewed and demonstrates:  as above  Relevant CV Studies: Echo 03/2022    1. Left ventricular ejection fraction, by estimation, is 65 to 70%. The  left ventricle has normal function. The left ventricle has no regional  wall motion abnormalities. There is mild left ventricular hypertrophy of  the basal-septal segment. Left  ventricular diastolic parameters are consistent with Grade III diastolic  dysfunction (restrictive).   2. Right ventricular systolic function is normal. The right ventricular  size is normal. There is mildly elevated pulmonary artery systolic  pressure.   3. Left atrial size was severely dilated.   4. Right atrial size was mildly dilated.   5. The mitral valve is normal in structure. Mild mitral valve  regurgitation. No evidence of mitral stenosis.   6. The aortic valve is tricuspid.  There is mild calcification of the  aortic valve. Aortic valve regurgitation is not visualized. Aortic valve  sclerosis/calcification is present, without any evidence of aortic  stenosis.   7. The inferior vena cava is normal in size with greater than 50%  respiratory variability, suggesting right atrial pressure of 3 mmHg.   Laboratory Data:  High  Sensitivity Troponin:   Recent Labs  Lab 11/24/23 0711 11/24/23 1028  TROPONINIHS 54* 50*     Chemistry Recent Labs  Lab 11/22/23 0802 11/23/23 0648 11/24/23 0711 11/24/23 1028  NA 139 139 139  --   K 3.8 3.1* 2.9*  --   CL 112* 111 114*  --   CO2 17* 17* 15*  --   GLUCOSE 80 74 116*  --   BUN 104* 102* 98*  --   CREATININE 2.68* 2.37* 2.15*  --   CALCIUM 7.9* 7.7* 8.0*  --   MG  --   --   --  1.9  GFRNONAA 17* 19* 22*  --   ANIONGAP 10 11 10   --     Recent Labs  Lab 11/21/23 0840 11/23/23 0648 11/24/23 0711  PROT 4.1* 4.1* 4.2*  ALBUMIN 2.6* 2.2* 2.3*  AST 84* 88* 37  ALT 421* 568* 382*  ALKPHOS 110 122 111  BILITOT 0.7 1.5* 0.9   Lipids No results for input(s): "CHOL", "TRIG", "HDL", "LABVLDL", "LDLCALC", "CHOLHDL" in the last 168 hours.  Hematology Recent Labs  Lab 11/22/23 0802 11/23/23 0648 11/24/23 0711  WBC 4.9 5.2 5.2  RBC 2.97* 2.95* 2.97*  HGB 9.9* 9.8* 9.7*  HCT 30.8* 29.9* 30.1*  MCV 103.7* 101.4* 101.3*  MCH 33.3 33.2 32.7  MCHC 32.1 32.8 32.2  RDW 15.7* 15.4 15.4  PLT 172 154 163   Thyroid  Recent Labs  Lab 11/24/23 0711  TSH 1.660  FREET4 0.65    BNPNo results for input(s): "BNP", "PROBNP" in the last 168 hours.  DDimer No results for input(s): "DDIMER" in the last 168 hours.   Radiology/Studies:  CT ABDOMEN PELVIS WO CONTRAST Result Date: 11/23/2023 CLINICAL DATA:  Metastatic breast cancer. Acute abdominal pain. Acute kidney injury. * Tracking Code: BO * EXAM: CT ABDOMEN AND PELVIS WITHOUT CONTRAST TECHNIQUE: Multidetector CT imaging of the abdomen and pelvis was performed following the standard protocol without IV contrast. RADIATION DOSE REDUCTION: This exam was performed according to the departmental dose-optimization program which includes automated exposure control, adjustment of the mA and/or kV according to patient size and/or use of iterative reconstruction technique. COMPARISON:  10/16/2023 FINDINGS: Lower chest: Right  coronary artery descending thoracic aortic atheromatous vascular calcification. Trace pericardial effusion. Mitral valve calcification. Small bilateral pleural effusions with passive atelectasis. Small type 1 hiatal hernia. Scattered small pulmonary nodules in the lung bases as on 10/16/2023, probably from metastatic disease. Hepatobiliary: Scattered hepatic hypodense lesions compatible with metastatic disease. A confluence of two lesions posteriorly in the right hepatic lobe measures 4.1 by 2.2 cm on image 8 series 2. A medial hypodense lesion posteriorly in the right hepatic lobe measures 4.4 by 3.1 cm on image 10 series 2, formerly about 4.0 by 2.7 cm. Cholecystectomy. Distal common bile duct proximally 9 mm in diameter, borderline prominent for age. Pancreas: Atrophic, otherwise unremarkable. Spleen: Unremarkable Adrenals/Urinary Tract: 2.0 by 2.0 cm right adrenal mass, internal density 34 Hounsfield units which is indeterminate. Bilateral nonobstructive renal calculi. Mild right hydronephrosis and proximal hydroureter extending to the iliac vessel cross over, however with no distal hydroureter. Foley catheter in  the urinary bladder which is mostly empty although there is a small amount of gas in the lumen of the urinary bladder. Stomach/Bowel: Small periampullary duodenal diverticulum. Large caliber distal colon and rectum containing progressive dilution of contrast medium as well as stool contents. Prior bowel resections include the right colon and at least part of the ileum. Vascular/Lymphatic: Atherosclerosis is present, including aortoiliac atherosclerotic disease. Reproductive: Uterus absent.  Adnexa unremarkable. Other: No supplemental non-categorized findings. Musculoskeletal: We partially include what appears to be a compression fracture and possible lytic lesions involving the T11 vertebral body. Suspected right eleventh rib fracture medially. Lucency eccentric to the left in the T12 vertebral body may  be from metastatic disease. Bony destructive findings in the posterior elements at L4 suspicious for osseous metastatic disease. Grade 1 anterolisthesis at L5-S1. Lucency in the left iliac wing on image 53 series 4 is probably a metastatic lesion. There is also lucency further medially in the left iliac bone on image 77 series 4. IMPRESSION: 1. Mild right hydronephrosis and proximal hydroureter extending to the iliac vessel cross over, however with no distal hydroureter. This could be from a distal ureteral stricture or from a recently passed calculus. 2. Bilateral nonobstructive renal calculi. 3. Scattered hepatic hypodense lesions compatible with metastatic disease. 4. Scattered small pulmonary nodules in the lung bases as on 10/16/2023, probably from metastatic disease. 5. 2.0 by 2.0 cm right adrenal mass, internal density 34 Hounsfield units which is indeterminate but quite likely metastatic. 6. We partially include what appears to be a compression fracture and possible lytic lesions involving the T11 vertebral body. Suspected right eleventh rib fracture medially. Bony destructive findings in the posterior elements at L4 suspicious for osseous metastatic disease. Left iliac lytic lesions suspicious for metastatic disease. 7. Small bilateral pleural effusions with passive atelectasis. 8. Small type 1 hiatal hernia. 9. Aortic and coronary atherosclerosis. Aortic Atherosclerosis (ICD10-I70.0). Electronically Signed   By: Gaylyn Rong M.D.   On: 11/23/2023 17:51   US RENAL Result Date: 11/21/2023 CLINICAL DATA:  Acute kidney injury. EXAM: RENAL / URINARY TRACT ULTRASOUND COMPLETE COMPARISON:  CT 10/16/2023 FINDINGS: Right Kidney: Renal measurements: 10.1 x 4.4 x 4.7 cm = volume: 110 mL. Moderate hydronephrosis is new from prior CT. Mild increased renal parenchymal echogenicity typical of chronic renal disease. Renal calculi on prior CT not well-defined on the current exam. No evidence of focal lesion. Left  Kidney: Renal measurements: 9 x 4.1 x 4.3 cm = volume: 83 mL. No hydronephrosis. Thinning of renal parenchyma with increased echogenicity. Shadowing 9 mm stone. Small renal cysts, largest 1.7 cm. No evidence of suspicious lesion. Bladder: Appears normal for degree of bladder distention. Other: None. IMPRESSION: 1. Moderate right hydronephrosis is new from prior CT. 2. Increased renal parenchymal echogenicity typical of chronic medical renal disease. 3. Left renal stone. Known right renal calculi on prior CT not well seen. Electronically Signed   By: Narda Rutherford M.D.   On: 11/21/2023 22:06     Assessment and Plan:   1. New onset atrial fibrillation with associated chest pain, elevated troponin - occurring in the context of admission for AKI, dehydration, hypokalemia in the context of cancer-related pain with widespread metastases - diltiazem drip was ordered but not yet started as she was just acutely transferred - also got carvedilol 25mg  BID this AM along with IV dilaudid, IV morphine, and SL NTG with BP downtrending from 170s-130s/40s presently - since HR currently 90s-100s at rest and carvedilol just given a short while  ago, will hold on diltiazem drip and reassess - still having some HR 120s-130s with repositioning - given comorbidities and new anemia, does not seem to be a great candidate for anticoagulation, will review further recs with MD - if HR begins to trend poorly controled and BP will support, can revisit low dose diltiazem drip  - check thyroid  2. 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 Hypokalemia - replacement per primary team - add Mg to labs  4. Systolic murmur - known for patient, prior echo with mild MR + aortic sclerosis without stenosis  5. Diastolic dysfunction, prior G3DD - appears dry on exam, getting IVF per primary team  6. Macrocytic anemia - per primary team  Risk Assessment/Risk Scores:     TIMI Risk Score for Unstable Angina or Non-ST Elevation MI:   The patient's TIMI risk score is 6, which indicates a 41% risk of all cause mortality, new or recurrent myocardial infarction or need for urgent revascularization in the next 14 days.    CHA2DS2-VASc Score = 6   This indicates a 9.7% annual risk of stroke. The patient's score is based upon: CHF History: 1 (probable chronic HFpEF per notes) HTN History: 1 Diabetes History: 0 Stroke History: 0 Vascular Disease History: 1 Age Score: 2 Gender Score: 1         For questions or updates, please contact [facility] Please consult [email or web address] for contact info under    Signed, Laurann Montana, PA-C  11/24/2023 10:23 AM  ADDENDUM:   Patient seen and examined. I personally taken a history, examined the patient, reviewed relevant notes,  laboratory data / imaging studies.  I performed a substantive portion of this encounter and formulated the important aspects of the plan.  I agree with the APP's note, impression, and recommendations; however, I have edited the note to reflect changes or salient points.   Patient is accompanied by her nephew at bedside. She denies anginal chest pain or heart failure symptoms. At the time of arrival patient has acute kidney injury with a peak creatinine of 3.07, hypokalemic, hypocalcemic, anemia, and  ovarian cancer with metastasis. Patient is noted to be in A-fib with RVR and cardiology consulted for further recommendations. Patient denies any prior gastrointestinal or intracranial bleeding. No prior history of atrial fibrillation.  On arrival patient did have chest pain when she was in A-fib with RVR.  With improved ventricular rates patient's chest pain has improved.  Currently chest pain-free  PHYSICAL EXAM: Today's Vitals   11/24/23 1200 11/24/23 1213 11/24/23 1258 11/24/23 1359  BP: (!) 125/47   138/63  Pulse: (!) 108     Resp: 19     Temp:    97.7 F (36.5 C)  TempSrc:    Axillary  SpO2: 100%     Weight:      Height:  PainSc:  7  5     Body mass index is 19.04 kg/m.   Net IO Since Admission: 2,656.11 mL [11/24/23 1546]  Filed Weights   11/21/23 1525  Weight: 53.5 kg    Physical Exam  Constitutional: No distress. She appears chronically ill.  hemodynamically stable  Neck: No JVD present.  Cardiovascular: S1 normal and S2 normal. An irregularly irregular rhythm present. Tachycardia present. Exam reveals no gallop, no S3 and no S4.  No murmur heard. Pulmonary/Chest: Effort normal and breath sounds normal. No stridor. She has no wheezes. She has no rales.  Abdominal:  Foley and rectal tube in place.  Musculoskeletal:        General: No edema.     Cervical back: Neck supple.  Skin: Skin is warm.   EKG: (personally reviewed by me) 11/24/2023: Atrial fibrillation with rapid ventricular rate, rare PVCs, inferolateral ST depressions consider ischemia, without underlying injury pattern.  Telemetry: (personally reviewed by me) Atrial fibrillation with episodes of both controlled and rapid ventricular rates   Impression:  New onset of atrial fibrillation. Precordial chest pain on arrival. Elevated troponins-likely demand ischemia. Acute kidney injury. Multiple electrolyte abnormalities. Ovarian cancer with metastasis Anemia, unspecified  Recommendations:   Newly discovered atrial fibrillation: Ventricular rates are improving. Rate control: Carvedilol. Rhythm control: N/A. Thromboembolic prophylaxis: N/A. CHA2DS2-VASc score high, 5 Review of electronic medical record notes a drop in hemoglobin -etiology unspecified at this time. Discussed the risks, benefits, and alternatives to oral anticoagulation.  Shared decision was to hold off on anticoagulation for now. Patient understands that not being on anticoagulation does predispose her to thromboembolic events. Will start low-dose Cardizem drip with the efforts of improving her ventricular rate and hopefully converting her to sinus rhythm. Recommend primary team/hematology oncology to further evaluate the cause of her anemia and once cleared anticoagulation could be considered. Continue telemetry Plan of care discussed with nephew at bedside, patient, and nursing staff  Precordial chest pain: Present on arrival. Now resolved. Likely driven by A-fib with RVR, chronic pain given her cancer, and uncontrolled hypertension. She does have history of moderate CAD based on prior ischemic workup. For now would recommend up titration of antianginal therapy and blood pressure management. As long as she remains symptom-free would recommend conservative management in the setting of ovarian cancer with metastases. Will order echo to evaluate LVEF and RWMA and etc.   Anemia: Recommend workup by either primary or hematology oncology.  Once cleared we will discuss trial of anticoagulation based on patient's goals of care.  Further recommendations to follow as the case evolves.   This note was created using a voice recognition software as a result there may be grammatical errors inadvertently enclosed that do not reflect the nature of this encounter. Every attempt is made to correct such errors.   Tessa Lerner, DO, [facility]  [address] [place], [place] [address] Pager: [phone] Office: [phone] 11/24/2023 3:46 PM

## 2023-11-24 NOTE — Progress Notes (Signed)
 Pt w/chest pain and L arm pain. Dr. Sharl Ma aware. Transfer order to 4E placed. Instructed by Dr. Sharl Ma to give carvedilol, imdur, and morphine to see if these meds help w/pain before transfer. Pain improved but unresolved. Dr. Sharl Ma informed. MD would now like pt transferred.

## 2023-11-24 NOTE — Progress Notes (Addendum)
 [facility]  PROGRESS NOTE  [name] ZOX:[id number] DOB: 04/12/1934 DOA: 11/21/2023 PCP: Lorenda Ishihara, MD   Brief HPI:    89 y.o. 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      Assessment/Plan:    Acute renal failure-in the setting of CKD stage III, likely due to ATN from hypotension and dehydration. -Started on LR at 50 mL/h -Creatinine improved to 2.15 -Follow BMP in am - Renal ultrasound shows moderate hydronephrosis;   -CT abdomen/pelvis without contrast obtained shows mild hydronephrosis, no other obstructive lesion noted  Chest pain -Patient has history of CAD -Troponin was 56 this morning -Given morphine, nitroglycerin, Imdur, Coreg -Continues to have pain, will consult cardiology for further recommendations  A-fib with RVR -Will start back on Coreg 25 mg p.o. 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  SpO2: 100 % O2 Flow Rate (L/min): 2 L/min    Vitals:   11/24/23 1025 11/24/23 1100 11/24/23 1200 11/24/23 1359  BP: (!) 135/45 131/61 (!) 125/47 138/63  Pulse: (!) 112 82 (!) 108   Resp: 13 14 19    Temp: (!) 97.4 F (36.3 C)   97.7 F (36.5 C)  TempSrc: Axillary   Axillary  SpO2: 100% 100% 100%   Weight:      Height:          Data Reviewed:  Basic Metabolic Panel: Recent Labs  Lab 11/21/23 0840 11/21/23 1611 11/22/23 0802 11/23/23 0648 11/24/23 0711 11/24/23 1028  NA 139 135 139 139 139  --   K 4.4 4.4 3.8 3.1* 2.9*  --   CL 111 113* 112* 111 114*  --   CO2 19* 15* 17* 17* 15*  --   GLUCOSE 189* 156* 80 74 116*  --   BUN >100* 119* 104* 102* 98*  --   CREATININE 2.93* 3.07* 2.68* 2.37* 2.15*  --   CALCIUM 8.2* 7.9* 7.9* 7.7* 8.0*  --   MG  --   --   --   --   --  1.9    CBC: Recent Labs  Lab 11/21/23 0840 11/22/23 0802 11/23/23 0648 11/24/23 0711  WBC 4.6 4.9 5.2 5.2  NEUTROABS 4.1  --   --   --   HGB 10.0* 9.9* 9.8* 9.7*  HCT 30.1* 30.8* 29.9* 30.1*  MCV 100.3* 103.7* 101.4* 101.3*  PLT 198 172 154 163  LFT Recent Labs  Lab 11/21/23 0840 11/23/23 0648 11/24/23 0711  AST 84* 88* 37  ALT 421* 568* 382*  ALKPHOS 110 122 111  BILITOT 0.7 1.5* 0.9  PROT 4.1* 4.1* 4.2*  ALBUMIN 2.6* 2.2* 2.3*     Antibiotics: Anti-infectives (From admission, onward)    None        DVT prophylaxis: Heparin  Code Status: DNR  Family Communication:    CONSULTS    Subjective    Patient developed chest pain this morning.  EKG showed A-fib with RVR.  Objective    Physical Examination:  Appears in mild distress Heart-irregularly irregular rhythm Lungs clear to auscultation bilaterally Abdomen is soft, nontender, no organomegaly  Status is: Inpatient:             Meredeth Ide   [facility] If 7PM-7AM, please contact night-coverage at [email or web address], Office  [phone]   11/24/2023, 4:29 PM   LOS: 3 days     Total time spent for coordination of care, direct patient care 90 minutes

## 2023-11-24 NOTE — Progress Notes (Signed)
 Pt transferred to [id number]

## 2023-11-24 NOTE — Progress Notes (Signed)
 Plan of care is reviewed. Pt has been progressing. She is alert and fully oriented x 4, afebrile, stable vital signs, normal respiratory effort, no acute distress noted overnight.   After Pt got laxative on day shift, she has had incontinent multiple bowel movements overnight from hard solid to liquid stools. We will hold laxative for now. No black tarry, or bloody stool seen. Abdominal pain is well tolerated. Her abdominal pain and rectal pain are better relieved after having BM. We make sure she has adequate oral fluid intake and IV hydration with LR 50 ml/hr.  Foley cath has yellow clear urine output. We will continue to monitor.     Filiberto Pinks, RN

## 2023-11-24 NOTE — Plan of Care (Signed)

## 2023-11-25 ENCOUNTER — Ambulatory Visit: Payer: Medicare Other

## 2023-11-25 ENCOUNTER — Other Ambulatory Visit: Payer: Self-pay

## 2023-11-25 ENCOUNTER — Telehealth: Payer: Self-pay

## 2023-11-25 ENCOUNTER — Inpatient Hospital Stay (HOSPITAL_COMMUNITY): Payer: Medicare Other

## 2023-11-25 ENCOUNTER — Ambulatory Visit
Admission: RE | Admit: 2023-11-25 | Discharge: 2023-11-25 | Disposition: A | Discharge status: Home / Self Care | Payer: Medicare Other | Source: Ambulatory Visit | Attending: Radiation Oncology

## 2023-11-25 DIAGNOSIS — Z510 Encounter for antineoplastic radiation therapy: Secondary | ICD-10-CM | POA: Diagnosis not present

## 2023-11-25 DIAGNOSIS — R7989 Other specified abnormal findings of blood chemistry: Secondary | ICD-10-CM | POA: Diagnosis not present

## 2023-11-25 DIAGNOSIS — C50412 Malignant neoplasm of upper-outer quadrant of left female breast: Secondary | ICD-10-CM | POA: Diagnosis not present

## 2023-11-25 DIAGNOSIS — R079 Chest pain, unspecified: Secondary | ICD-10-CM

## 2023-11-25 DIAGNOSIS — I2510 Atherosclerotic heart disease of native coronary artery without angina pectoris: Secondary | ICD-10-CM

## 2023-11-25 DIAGNOSIS — C7951 Secondary malignant neoplasm of bone: Secondary | ICD-10-CM | POA: Diagnosis not present

## 2023-11-25 DIAGNOSIS — N179 Acute kidney failure, unspecified: Secondary | ICD-10-CM | POA: Diagnosis not present

## 2023-11-25 DIAGNOSIS — R072 Precordial pain: Secondary | ICD-10-CM | POA: Diagnosis not present

## 2023-11-25 DIAGNOSIS — I4891 Unspecified atrial fibrillation: Secondary | ICD-10-CM | POA: Diagnosis not present

## 2023-11-25 DIAGNOSIS — Z170 Estrogen receptor positive status [ER+]: Secondary | ICD-10-CM | POA: Diagnosis not present

## 2023-11-25 DIAGNOSIS — D649 Anemia, unspecified: Secondary | ICD-10-CM | POA: Diagnosis not present

## 2023-11-25 LAB — RAD ONC ARIA SESSION SUMMARY
Course Elapsed Days: 7
Plan Fractions Treated to Date: 4
Plan Prescribed Dose Per Fraction: 3 Gy
Plan Total Fractions Prescribed: 10
Plan Total Prescribed Dose: 30 Gy
Reference Point Dosage Given to Date: 12 Gy
Reference Point Session Dosage Given: 3 Gy
Session Number: 4

## 2023-11-25 LAB — BASIC METABOLIC PANEL
Anion gap: 7 (ref 5–15)
BUN: 81 mg/dL — ABNORMAL HIGH (ref 8–23)
CO2: 17 mmol/L — ABNORMAL LOW (ref 22–32)
Calcium: 7.7 mg/dL — ABNORMAL LOW (ref 8.9–10.3)
Chloride: 111 mmol/L (ref 98–111)
Creatinine, Ser: 1.87 mg/dL — ABNORMAL HIGH (ref 0.44–1.00)
GFR, Estimated: 26 mL/min — ABNORMAL LOW (ref >60)
Glucose, Bld: 169 mg/dL — ABNORMAL HIGH (ref 70–99)
Potassium: 2.8 mmol/L — ABNORMAL LOW (ref 3.5–5.1)
Sodium: 135 mmol/L (ref 135–145)

## 2023-11-25 LAB — ECHOCARDIOGRAM COMPLETE
Area-P 1/2: 4.15 cm2
Height: 66 in
S' Lateral: 3.2 cm
Weight: 1887.14 [oz_av]

## 2023-11-25 LAB — MAGNESIUM: Magnesium: 1.9 mg/dL (ref 1.7–2.4)

## 2023-11-25 MED ORDER — POTASSIUM CHLORIDE 10 MEQ/100ML IV SOLN
10.0000 meq | INTRAVENOUS | Status: AC
  Administered 2023-11-25 (×5): 10 meq via INTRAVENOUS
  Filled 2023-11-25 (×5): qty 100

## 2023-11-25 MED ORDER — DILTIAZEM HCL 30 MG PO TABS
30.0000 mg | ORAL_TABLET | Freq: Three times a day (TID) | ORAL | Status: DC
  Administered 2023-11-25 – 2023-11-26 (×4): 30 mg via ORAL
  Filled 2023-11-25 (×4): qty 1

## 2023-11-25 NOTE — Progress Notes (Signed)
 [facility]  PROGRESS NOTE  [name] WJX:[id number] DOB: 03-Nov-1933 DOA: 11/21/2023 PCP: Lorenda Ishihara, MD   Brief HPI:    89 y.o. 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      Assessment/Plan:    Acute renal failure-in the setting of CKD stage III, likely due to ATN from hypotension and dehydration. -Started on LR at 50 mL/h -Creatinine improved to 1.87; close to baseline - Renal ultrasound shows moderate hydronephrosis;   -CT abdomen/pelvis without contrast obtained shows mild hydronephrosis, no other obstructive lesion noted -Follow BMP in am  Chest pain -Resolved -Patient has history of CAD -Developed chest pain yesterday not improved with morphine, nitroglycerin, Imdur, Coreg -Cardiology was consulted, no aggressive intervention recommended   A-fib with RVR -Likely new onset -Will start back on Coreg 25 mg p.o. 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 -Oncology following  Abnormal LFTs -Secondary to hepatic metastasis  Cancer-related pain -Continue oxycodone, IV morphine as needed  Constipation -Resolved   Hypokalemia -Potassium is 2.8 this morning -Replace potassium and follow BMP in am  Medications     carvedilol  25 mg Oral BID WC   Chlorhexidine Gluconate Cloth  6 each Topical Daily   feeding supplement  237 mL Oral BID BM   heparin  5,000 Units Subcutaneous Q8H   isosorbide mononitrate  60 mg Oral Daily   polyethylene glycol  17 g Oral Daily   senna-docusate  2 tablet Oral BID     Data  Reviewed:   CBG:  No results for input(s): "GLUCAP" in the last 168 hours.  SpO2: 97 % O2 Flow Rate (L/min): 2 L/min    Vitals:   11/24/23 2100 11/25/23 0100 11/25/23 0300 11/25/23 0554  BP: (!) 138/47 (!) 136/52 130/62   Pulse: 88 86 74 86  Resp: 14 15 16 16   Temp:   98.4 F (36.9 C) 98 F (36.7 C)  TempSrc:   Oral Oral  SpO2: 100% 96% 96% 97%  Weight:      Height:          Data Reviewed:  Basic Metabolic Panel: Recent Labs  Lab 11/21/23 1611 11/22/23 0802 11/23/23 0648 11/24/23 0711 11/24/23 1028 11/25/23 0520  NA 135 139 139 139  --  135  K 4.4 3.8 3.1* 2.9*  --  2.8*  CL 113* 112* 111 114*  --  111  CO2 15* 17* 17* 15*  --  17*  GLUCOSE 156* 80 74 116*  --  169*  BUN 119* 104* 102* 98*  --  81*  CREATININE 3.07* 2.68* 2.37* 2.15*  --  1.87*  CALCIUM 7.9* 7.9* 7.7* 8.0*  --  7.7*  MG  --   --   --   --  1.9 1.9    CBC: Recent Labs  Lab 11/21/23 0840 11/22/23 0802 11/23/23 0648 11/24/23 0711  WBC 4.6 4.9 5.2 5.2  NEUTROABS 4.1  --   --   --   HGB 10.0* 9.9* 9.8* 9.7*  HCT 30.1* 30.8* 29.9* 30.1*  MCV 100.3* 103.7* 101.4* 101.3*  PLT 198 172 154 163  LFT Recent Labs  Lab 11/21/23 0840 11/23/23 0648 11/24/23 0711  AST 84* 88* 37  ALT 421* 568* 382*  ALKPHOS 110 122 111  BILITOT 0.7 1.5* 0.9  PROT 4.1* 4.1* 4.2*  ALBUMIN 2.6* 2.2* 2.3*     Antibiotics: Anti-infectives (From admission, onward)    None        DVT prophylaxis: Heparin  Code Status: DNR  Family Communication:    CONSULTS    Subjective   Patient seen and examined, denies chest pain or shortness of breath.  Objective    Physical Examination:  General-appears in no acute distress Heart-S1-S2, regular, no murmur auscultated Lungs-clear to auscultation bilaterally, no wheezing or crackles auscultated Abdomen-soft, nontender, no organomegaly Extremities-no edema in the lower extremities Neuro-alert, oriented x3, no focal deficit noted  Status is:  Inpatient:             Meredeth Ide   [facility] If 7PM-7AM, please contact night-coverage at [email or web address], Office  [phone]   11/25/2023, 7:57 AM  LOS: 4 days     Total time spent for coordination of care, direct patient care 90 minutes

## 2023-11-25 NOTE — Progress Notes (Addendum)
 [name]   DOB:06-Oct-1934   WU#:[id number]      ASSESSMENT & PLAN:  1.  Breast cancer with mets to liver, spine, peritoneum -Initially diagnosed with right-sided breast cancer in 2010.   -S/p lumpectomy, right breast RT and anastrozole, completed in 2015. 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  Subsequently had a left breast lumpectomy with radioactive seed localization in 2020. -More recently, she is status post Faslodex and abemaciclib 2 weeks ago.  Continue to hold at this time. - Consideration for continuing oncologic systemic therapy versus palliative care.  Patient has expressed that she would like to continue treatments.  Dr. Truett Perna will continue discussions with patient and family regarding continuing treatment versus hospice care. - S/p RT, last dose 11/18/2023.  Recommend resuming radiation today. - Follows with medical oncology/Dr. Truett Perna   2.  Severe back pain/abdominal pain - Now admitted with severe back pain and abdominal pain - Improving.  Reports that pain is much better today. - Continue judicious pain management - Recommend increasing ambulation.  3.  Failure to thrive/dehydration Constipation - Admitted with failure to thrive, dehydration, and constipation. - Constipation improved.  Rectal tube may be DC'd. - Encourage ambulation   4.  Anemia, macrocytic - Hemoglobin stable 9.7 with MCV 101.3 - Likely multifactorial - No transfusional intervention warranted at this time - Will continue to monitor CBC with differential   5.  AKI/acute renal failure - Elevated creatinine 2.15 with elevated BUN 98 - Continue gentle hydration - Right hydronephrosis   6.  History of ovarian CA - In 2009.  No recurrence to date.   7.  New onset A-fib -Elevated troponin - Seen by cardiology, not a candidate for [facility].  8.  Right hydronephrosis - Per CT and ultrasound    Code Status DNR-Limited   Subjective:  Patient seen asleep in bed, somnolent appearing  during assessment and exam.  States she took a sleeping pill at 9 PM last night and attributes current sleepiness as she did not sleep well during the night.  Reports abdominal pain and back pain is much better.  Patient's daughter and other family member at bedside, expressed concern over patient's history of falls.  Objective:  Vitals:   11/25/23 0800 11/25/23 0900  BP: (!) 144/56 (!) 156/48  Pulse: 96 89  Resp: (!) 21   Temp:    SpO2: 97%      Intake/Output Summary (Last 24 hours) at 11/25/2023 0957 Last data filed at 11/25/2023 7253 Gross per 24 hour  Intake 1012.54 ml  Output 950 ml  Net 62.54 ml     REVIEW OF SYSTEMS:   Constitutional: + Fatigue, denies fevers, chills or abnormal night sweats Eyes: Denies blurriness of vision, double vision or watery eyes Ears, nose, mouth, throat, and face: Denies mucositis or sore throat Respiratory: Denies cough, dyspnea or wheezes Cardiovascular: Denies palpitation, chest discomfort or lower extremity swelling Gastrointestinal: +Constipation/diarrhea +rectal tube Skin: Denies abnormal skin rashes Lymphatics: Denies new lymphadenopathy or easy bruising Neurological: Denies numbness, tingling or new weaknesses Behavioral/Psych: Mood is stable, no new changes  All other systems were reviewed with the patient and are negative.  PHYSICAL EXAMINATION: ECOG PERFORMANCE STATUS: 3 - Symptomatic, >50% confined to bed  Vitals:   11/25/23 0800 11/25/23 0900  BP: (!) 144/56 (!) 156/48  Pulse: 96 89  Resp: (!) 21   Temp:    SpO2: 97%    Filed Weights   11/21/23 1525  Weight: 117 lb 15.1  oz (53.5 kg)    GENERAL: +Ill-appearing +frail SKIN: + Pale skin color, texture, turgor are normal, no rashes or significant lesions EYES: normal, conjunctiva are pink and non-injected, sclera clear OROPHARYNX: no exudate, no erythema and lips, buccal mucosa, and tongue normal  NECK: supple, thyroid normal size, non-tender, without nodularity LYMPH: no  palpable lymphadenopathy in the cervical, axillary or inguinal LUNGS: clear to auscultation and percussion with normal breathing effort HEART: regular rate & rhythm and no murmurs and no lower extremity edema ABDOMEN: abdomen soft, non-tender and normal bowel sounds MUSCULOSKELETAL: no cyanosis of digits and no clubbing  PSYCH: alert & oriented x 3 with fluent speech NEURO: no focal motor/sensory deficits   All questions were answered. The patient knows to call the clinic with any problems, questions or concerns.   The total time spent in the appointment was 40 minutes encounter with patient including review of chart and various tests results, discussions about plan of care and coordination of care plan  Dawson Bills, NP 11/25/2023 9:57 AM    Labs Reviewed:  Lab Results  Component Value Date   WBC 5.2 11/24/2023   HGB 9.7 (L) 11/24/2023   HCT 30.1 (L) 11/24/2023   MCV 101.3 (H) 11/24/2023   PLT 163 11/24/2023   Recent Labs    11/21/23 0840 11/21/23 1611 11/23/23 0648 11/24/23 0711 11/25/23 0520  NA 139   < > 139 139 135  K 4.4   < > 3.1* 2.9* 2.8*  CL 111   < > 111 114* 111  CO2 19*   < > 17* 15* 17*  GLUCOSE 189*   < > 74 116* 169*  BUN >100*   < > 102* 98* 81*  CREATININE 2.93*   < > 2.37* 2.15* 1.87*  CALCIUM 8.2*   < > 7.7* 8.0* 7.7*  GFRNONAA 15*   < > 19* 22* 26*  PROT 4.1*  --  4.1* 4.2*  --   ALBUMIN 2.6*  --  2.2* 2.3*  --   AST 84*  --  88* 37  --   ALT 421*  --  568* 382*  --   ALKPHOS 110  --  122 111  --   BILITOT 0.7  --  1.5* 0.9  --    < > = values in this interval not displayed.    Studies Reviewed:  ECHOCARDIOGRAM COMPLETE Result Date: 11/25/2023    ECHOCARDIOGRAM REPORT   Patient Name:   SHWETA AMAN Date of Exam: 11/25/2023 Medical Rec #:  [id number]        Height:       66.0 in Accession #:    [id number]       Weight:       117.9 lb Date of Birth:  06/17/1934       BSA:          1.598 m Patient Age:    89 years         BP:           144/56  mmHg Patient Gender: F                HR:           88 bpm. Exam Location:  Inpatient Procedure: 2D Echo, Cardiac Doppler and Color Doppler (Both Spectral and Color            Flow Doppler were utilized during procedure). Indications:    Chest Pain R07.9  History:  Patient has prior history of Echocardiogram examinations, most                 recent 03/22/2022. CAD; Risk Factors:Hypertension.  Sonographer:    Webb Laws Referring Phys: [id number] SUNIT TOLIA IMPRESSIONS  1. Severe basal septal hypertrophy on off axis imaging; maximal septal thickness 20 mm. Maximal apical hypertrophy 20 mm. No resting gradient noted. Left ventricular ejection fraction, by estimation, is 65 to 70%. The left ventricle has normal function.  The left ventricle has no regional wall motion abnormalities. There is severe asymmetric left ventricular hypertrophy of the basal-septal segment. Left ventricular diastolic function could not be evaluated.  2. Right ventricular systolic function is normal. The right ventricular size is normal.  3. Left atrial size was mildly dilated.  4. The mitral valve is normal in structure. No evidence of mitral valve regurgitation. No evidence of mitral stenosis. The mean mitral valve gradient is 2.0 mmHg. Moderate mitral annular calcification.  5. The aortic valve is tricuspid. There is mild calcification of the aortic valve. Aortic valve regurgitation is mild. No aortic stenosis is present.  6. The inferior vena cava is normal in size with greater than 50% respiratory variability, suggesting right atrial pressure of 3 mmHg. Comparison(s): Prior images reviewed side by side. Aortic regurgitation not seen in 2023 study. Maximal basal septal thickness 14 mm on better quality long axis view. Apex not well seen in 2023 study. Conclusion(s)/Recommendation(s): Consider outpatient CMR for clarification of hypertrophy if clinically indicated. FINDINGS  Left Ventricle: Severe basal septal hypertrophy on off  axis imaging; maximal septal thickness 20 mm. Maximal apical hypertrophy 20 mm. No resting gradient noted. Left ventricular ejection fraction, by estimation, is 65 to 70%. The left ventricle has normal function. The left ventricle has no regional wall motion abnormalities. Global longitudinal strain performed but not reported based on interpreter judgement due to suboptimal tracking. The left ventricular internal cavity size was normal in size. There is severe asymmetric left ventricular hypertrophy of the basal-septal segment. Left ventricular diastolic function could not be evaluated due to mitral annular calcification (moderate or greater). Left ventricular diastolic function could not be evaluated. Right Ventricle: The right ventricular size is normal. No increase in right ventricular wall thickness. Right ventricular systolic function is normal. Left Atrium: Left atrial size was mildly dilated. Right Atrium: Right atrial size was normal in size. Pericardium: There is no evidence of pericardial effusion. Mitral Valve: The mitral valve is normal in structure. Moderate mitral annular calcification. No evidence of mitral valve regurgitation. No evidence of mitral valve stenosis. The mean mitral valve gradient is 2.0 mmHg. Tricuspid Valve: The tricuspid valve is normal in structure. Tricuspid valve regurgitation is mild . No evidence of tricuspid stenosis. Aortic Valve: The aortic valve is tricuspid. There is mild calcification of the aortic valve. Aortic valve regurgitation is mild. No aortic stenosis is present. Pulmonic Valve: The pulmonic valve was not well visualized. Pulmonic valve regurgitation is not visualized. No evidence of pulmonic stenosis. Aorta: The aortic root and ascending aorta are structurally normal, with no evidence of dilitation. Venous: The inferior vena cava is normal in size with greater than 50% respiratory variability, suggesting right atrial pressure of 3 mmHg. IAS/Shunts: The atrial  septum is grossly normal. Additional Comments: 3D imaging was not performed.  LEFT VENTRICLE PLAX 2D LVIDd:         4.00 cm   Diastology LVIDs:         3.20 cm   LV e' medial:  5.11 cm/s LV PW:         1.25 cm   LV E/e' medial:  21.7 LV IVS:        1.10 cm   LV e' lateral:   9.03 cm/s LVOT diam:     1.70 cm   LV E/e' lateral: 12.3 LV SV:         51 LV SV Index:   32 LVOT Area:     2.27 cm  RIGHT VENTRICLE             IVC RV Basal diam:  3.00 cm     IVC diam: 1.40 cm RV S prime:     11.60 cm/s TAPSE (M-mode): 1.5 cm LEFT ATRIUM             Index        RIGHT ATRIUM          Index LA diam:        4.50 cm 2.82 cm/m   RA Area:     7.16 cm LA Vol (A2C):   54.4 ml 34.05 ml/m  RA Volume:   13.10 ml 8.20 ml/m LA Vol (A4C):   47.2 ml 29.54 ml/m LA Biplane Vol: 54.4 ml 34.05 ml/m  AORTIC VALVE LVOT Vmax:   145.50 cm/s LVOT Vmean:  89.250 cm/s LVOT VTI:    0.224 m  AORTA Ao Root diam: 2.70 cm Ao Asc diam:  3.10 cm MITRAL VALVE                TRICUSPID VALVE MV Area (PHT): 4.15 cm     TR Peak grad:   25.0 mmHg MV Mean grad:  2.0 mmHg     TR Vmax:        250.00 cm/s MV Decel Time: 183 msec MV E velocity: 111.00 cm/s  SHUNTS                             Systemic VTI:  0.22 m                             Systemic Diam: 1.70 cm Riley Lam MD Electronically signed by Riley Lam MD Signature Date/Time: 11/25/2023/9:28:52 AM    Final    CT ABDOMEN PELVIS WO CONTRAST Result Date: 11/23/2023 CLINICAL DATA:  Metastatic breast cancer. Acute abdominal pain. Acute kidney injury. * Tracking Code: BO * EXAM: CT ABDOMEN AND PELVIS WITHOUT CONTRAST TECHNIQUE: Multidetector CT imaging of the abdomen and pelvis was performed following the standard protocol without IV contrast. RADIATION DOSE REDUCTION: This exam was performed according to the departmental dose-optimization program which includes automated exposure control, adjustment of the mA and/or kV according to patient size and/or use of iterative reconstruction  technique. COMPARISON:  10/16/2023 FINDINGS: Lower chest: Right coronary artery descending thoracic aortic atheromatous vascular calcification. Trace pericardial effusion. Mitral valve calcification. Small bilateral pleural effusions with passive atelectasis. Small type 1 hiatal hernia. Scattered small pulmonary nodules in the lung bases as on 10/16/2023, probably from metastatic disease. Hepatobiliary: Scattered hepatic hypodense lesions compatible with metastatic disease. A confluence of two lesions posteriorly in the right hepatic lobe measures 4.1 by 2.2 cm on image 8 series 2. A medial hypodense lesion posteriorly in the right hepatic lobe measures 4.4 by 3.1 cm on image 10 series 2, formerly about 4.0 by 2.7 cm. Cholecystectomy. Distal common bile duct proximally 9 mm in diameter,  borderline prominent for age. Pancreas: Atrophic, otherwise unremarkable. Spleen: Unremarkable Adrenals/Urinary Tract: 2.0 by 2.0 cm right adrenal mass, internal density 34 Hounsfield units which is indeterminate. Bilateral nonobstructive renal calculi. Mild right hydronephrosis and proximal hydroureter extending to the iliac vessel cross over, however with no distal hydroureter. Foley catheter in the urinary bladder which is mostly empty although there is a small amount of gas in the lumen of the urinary bladder. Stomach/Bowel: Small periampullary duodenal diverticulum. Large caliber distal colon and rectum containing progressive dilution of contrast medium as well as stool contents. Prior bowel resections include the right colon and at least part of the ileum. Vascular/Lymphatic: Atherosclerosis is present, including aortoiliac atherosclerotic disease. Reproductive: Uterus absent.  Adnexa unremarkable. Other: No supplemental non-categorized findings. Musculoskeletal: We partially include what appears to be a compression fracture and possible lytic lesions involving the T11 vertebral body. Suspected right eleventh rib fracture  medially. Lucency eccentric to the left in the T12 vertebral body may be from metastatic disease. Bony destructive findings in the posterior elements at L4 suspicious for osseous metastatic disease. Grade 1 anterolisthesis at L5-S1. Lucency in the left iliac wing on image 53 series 4 is probably a metastatic lesion. There is also lucency further medially in the left iliac bone on image 77 series 4. IMPRESSION: 1. Mild right hydronephrosis and proximal hydroureter extending to the iliac vessel cross over, however with no distal hydroureter. This could be from a distal ureteral stricture or from a recently passed calculus. 2. Bilateral nonobstructive renal calculi. 3. Scattered hepatic hypodense lesions compatible with metastatic disease. 4. Scattered small pulmonary nodules in the lung bases as on 10/16/2023, probably from metastatic disease. 5. 2.0 by 2.0 cm right adrenal mass, internal density 34 Hounsfield units which is indeterminate but quite likely metastatic. 6. We partially include what appears to be a compression fracture and possible lytic lesions involving the T11 vertebral body. Suspected right eleventh rib fracture medially. Bony destructive findings in the posterior elements at L4 suspicious for osseous metastatic disease. Left iliac lytic lesions suspicious for metastatic disease. 7. Small bilateral pleural effusions with passive atelectasis. 8. Small type 1 hiatal hernia. 9. Aortic and coronary atherosclerosis. Aortic Atherosclerosis (ICD10-I70.0). Electronically Signed   By: Gaylyn Rong M.D.   On: 11/23/2023 17:51   US RENAL Result Date: 11/21/2023 CLINICAL DATA:  Acute kidney injury. EXAM: RENAL / URINARY TRACT ULTRASOUND COMPLETE COMPARISON:  CT 10/16/2023 FINDINGS: Right Kidney: Renal measurements: 10.1 x 4.4 x 4.7 cm = volume: 110 mL. Moderate hydronephrosis is new from prior CT. Mild increased renal parenchymal echogenicity typical of chronic renal disease. Renal calculi on prior CT  not well-defined on the current exam. No evidence of focal lesion. Left Kidney: Renal measurements: 9 x 4.1 x 4.3 cm = volume: 83 mL. No hydronephrosis. Thinning of renal parenchyma with increased echogenicity. Shadowing 9 mm stone. Small renal cysts, largest 1.7 cm. No evidence of suspicious lesion. Bladder: Appears normal for degree of bladder distention. Other: None. IMPRESSION: 1. Moderate right hydronephrosis is new from prior CT. 2. Increased renal parenchymal echogenicity typical of chronic medical renal disease. 3. Left renal stone. Known right renal calculi on prior CT not well seen. Electronically Signed   By: Narda Rutherford M.D.   On: 11/21/2023 22:06   US BIOPSY (LIVER) Result Date: 10/30/2023 INDICATION: History of prior left breast carcinoma. Imaging evidence metastatic disease in the liver, multiple skeletal sites and possibly lungs. The patient presents for liver lesion biopsy. EXAM: ULTRASOUND GUIDED CORE BIOPSY  OF LIVER MEDICATIONS: None. ANESTHESIA/SEDATION: Moderate (conscious) sedation was employed during this procedure. A total of Versed 1.0 mg and Fentanyl 50 mcg was administered intravenously. Moderate Sedation Time: 15 minutes. The patient's level of consciousness and vital signs were monitored continuously by radiology nursing throughout the procedure under my direct supervision. PROCEDURE: The procedure, risks, benefits, and alternatives were explained to the patient. Questions regarding the procedure were encouraged and answered. The patient understands and consents to the procedure. A time-out was performed prior to initiating the procedure. The abdominal wall was prepped with exiting in a sterile fashion, and a sterile drape was applied covering the operative field. A sterile gown and sterile gloves were used for the procedure. Local anesthesia was provided with 1% Lidocaine. Ultrasound was performed to localize liver lesions. A 17 gauge trocar needle was advanced into the liver at  the level of a lesion within the right lobe. Three separate coaxial 18 gauge core biopsy samples were obtained and submitted in formalin. Additional ultrasound was performed after removal of the outer needle. COMPLICATIONS: None immediate. FINDINGS: Multiple rounded hypoechoic mass lesions are seen throughout the liver parenchyma. A lesion within the medial right lobe was chosen for sampling measuring approximately 2.5 x 2.2 x 2.5 cm. Solid tissue was obtained. IMPRESSION: Ultrasound-guided core biopsy performed of a mass lesion within the right lobe of the liver measuring 2.5 cm in maximum diameter. Electronically Signed   By: Irish Lack M.D.   On: 10/30/2023 11:42   Ms. Lefevers denies chest pain.  She was alert when I saw her at approximately 7 AM.  She reports no back pain unless she moves.  We discussed treatment options for the metastatic breast cancer.  She indicated she would like to resume treatment of the breast cancer if/when her performance status improves.  Recommendations: Continue intravenous hydration, follow creatinine, replete potassium Consider urology consult for ureter stent if her performance status improves Resume palliative radiation to the spine Out of bed Continue to hold Faslodex and Abemaciclib

## 2023-11-25 NOTE — Telephone Encounter (Signed)
 Returned VM to pt's daughter as requested regarding pt's discharge process. Pt's daughter verbalizes understanding and will reach out to pt's hospital care team with further questions/concerns.

## 2023-11-25 NOTE — Plan of Care (Signed)
  Problem: Education: Goal: Knowledge of General Education information will improve Description: Including pain rating scale, medication(s)/side effects and non-pharmacologic comfort measures Outcome: Progressing   Problem: Clinical Measurements: Goal: Ability to maintain clinical measurements within normal limits will improve Outcome: Progressing Goal: Will remain free from infection Outcome: Progressing Goal: Diagnostic test results will improve Outcome: Progressing Goal: Respiratory complications will improve Outcome: Progressing Goal: Cardiovascular complication will be avoided Outcome: Progressing   Problem: Activity: Goal: Risk for activity intolerance will decrease Outcome: Progressing   Problem: Coping: Goal: Level of anxiety will decrease Outcome: Progressing   Problem: Elimination: Goal: Will not experience complications related to bowel motility Outcome: Progressing Goal: Will not experience complications related to urinary retention Outcome: Progressing   Problem: Pain Managment: Goal: General experience of comfort will improve and/or be controlled Outcome: Progressing   Problem: Safety: Goal: Ability to remain free from injury will improve Outcome: Progressing   Problem: Skin Integrity: Goal: Risk for impaired skin integrity will decrease Outcome: Progressing   Problem: Health Behavior/Discharge Planning: Goal: Ability to manage health-related needs will improve Outcome: Not Progressing   Problem: Nutrition: Goal: Adequate nutrition will be maintained Outcome: Not Progressing

## 2023-11-25 NOTE — Progress Notes (Addendum)
 Patient Name: [name] Date of Encounter: 11/25/2023 [facility] Cardiologist: Donato Schultz, MD   Interval Summary  .    Patient off to radiation oncology appointment upon my arrival to the room. It appears that she has been fairly well rate-controlled on current regimen as listed below Remains in A. Fib with HR in 70s as of this AM Unable to evaluate symptoms of patient Patient determined to not be candidate for [facility] per primary and oncology team   Vital Signs .    Vitals:   11/25/23 0554 11/25/23 0700 11/25/23 0800 11/25/23 0900  BP:  138/71 (!) 144/56 (!) 156/48  Pulse: 86 85 96 89  Resp: 16 (!) 21 (!) 21   Temp: 98 F (36.7 C)     TempSrc: Oral     SpO2: 97% 97% 97%   Weight:      Height:        Intake/Output Summary (Last 24 hours) at 11/25/2023 1344 Last data filed at 11/25/2023 0306 Gross per 24 hour  Intake 1012.54 ml  Output 650 ml  Net 362.54 ml      11/21/2023    3:25 PM 11/21/2023    8:49 AM 11/11/2023    9:55 AM  Last 3 Weights  Weight (lbs) 117 lb 15.1 oz 113 lb 106 lb 8 oz  Weight (kg) 53.5 kg 51.256 kg 48.308 kg     Telemetry/ECG    Atrial fibrillation, HR 70s - Personally Reviewed  Assessment & Plan .     New onset atrial fibrillation with RVR  Chest pain Elevated troponin History of moderate CAD Hypertension  Troponin 54 > 50 CHA2DS2-VASc score of 5  Most recent BP 121/51 with HR 77 TSH, free T4 normal Patient has remained rate-controlled on current regimen  Echo this admission showed: LVEF 65-70%, no RWMA, severe LVH, normal RV, mild AR Primary team as well as oncology noted that patient is not a candidate for anticoagulation, drop in hemoglobin to high 9 when baseline is 12-13  Continue Coreg 25 mg BID Continue IV diltiazem  Continue Imdur 60 mg daily  Per primary Anemia Breast cancer with mets Abnormal LFTs Cancer related pain Hypokalemia  AKI on CKD 3   For questions or updates, please contact [facility] Please consult [email or web address] for contact info under     Signed, Olena Leatherwood, PA-C   ADDENDUM:   Patient seen and examined.  I personally taken a history, examined the patient, reviewed relevant notes,  laboratory data / imaging studies.  I performed a substantive portion of this encounter and formulated the important aspects of the plan.  I agree with the APP's note, impression, and recommendations; however, I have edited the note to reflect changes or salient points.   Patient seen and examined at bedside. Denies anginal chest pain. Feels better compared to yesterday. Spoke to nursing staff as well -no cardiac symptoms, Foley catheter and rectal tube have been discontinued No family present at bedside  PHYSICAL EXAM: Today's Vitals   11/25/23 0800 11/25/23 0900 11/25/23 0945 11/25/23 1350  BP: (!) 144/56 (!) 156/48  132/78  Pulse: 96 89    Resp: (!) 21   13  Temp:    97.7 F (36.5 C)  TempSrc:    Oral  SpO2: 97%     Weight:      Height:      PainSc:  2  Asleep    Body mass index is 19.04 kg/m.  Net IO Since Admission: 3,017.65 mL [11/25/23 1532]  Filed Weights   11/21/23 1525  Weight: 53.5 kg    Physical Exam  Constitutional: No distress. She appears chronically ill.  hemodynamically stable  Neck: No JVD present.  Cardiovascular: Normal rate, S1 normal and S2 normal. An irregularly irregular rhythm present. Exam reveals no gallop, no S3 and no S4.  No murmur heard. Pulmonary/Chest: Effort normal and breath sounds normal. No stridor. She has no wheezes. She has no rales.  Musculoskeletal:        General: No edema.     Cervical back: Neck supple.  Skin: Skin is warm.   EKG: (personally reviewed by me) 11/24/2023: Atrial fibrillation, 121 bpm, occasional PVCs, ST depressions in inferior lateral leads.  Telemetry: (personally reviewed by me) A-fib with controlled ventricular rate   Impression / Recommendations::  Newly discovered atrial  fibrillation: Ventricular rates are improving compared to yesterday. Currently on carvedilol and Cardizem drip. Will transition her from Cardizem drip to Cardizem 30 mg p.o. 3 times daily with holding parameters.  Cardizem can be changed to long-acting closer to discharge depending on how much dose she has been requiring. Thromboembolic prophylaxis: N/A CHADS2 Vasc score, 5 Review of electronic medical records notes a drop in hemoglobin and etiology unspecified at this time.  She is also currently being evaluated by hematology oncology for breast cancer with metastasis to liver/spine/peritoneum.  Will need to get hematology and primary team clearance prior to starting anticoagulation.  She is also frail with a BMI of 19, compression fracture (per EMR) and at high risk for falls and fractures.  After discussing the risks, benefits, alternatives to oral anticoagulation patient decided to hold off on anticoagulation at this time.  Patient understands that not being on anticoagulation does predispose her to thromboembolic events.  I have also discussed this further with attending physician and 11/24/2023. Continue telemetry. Her daughter updated.  Precordial chest pain: Present on arrival. Now resolved Likely driven by A-fib with RVR, chronic pain given her cancer, and uncontrolled hypertension. Since last 24 hours patient has not had any anginal chest pain. Echocardiogram notes preserved LVEF, no regional wall motion abnormalities, and basal and apical hypertrophy. As long as she remains asymptomatic would not recommend ischemic workup at this time. Clinical correlation required and focus on improving modifiable cardiovascular risk factors.  Anemia: Will defer workup to primary team and hematology oncology.  Breast cancer with metastasis to liver/spine/peritoneum: Being followed by hematology oncology  Cardiology will sign off for now please reach out if any questions or concerns arise.  When she  is closer to discharge please reach out so follow-up appointment can be scheduled with her primary cardiologist Dr. Donato Schultz or his APP.  Called her daughter to update her from cardiology standpoint but it went to voicemail.   Further recommendations to follow as the case evolves.   This note was created using a voice recognition software as a result there may be grammatical errors inadvertently enclosed that do not reflect the nature of this encounter. Every attempt is made to correct such errors.   Tessa Lerner, DO, [facility]  [address] [place], [place] [address] Pager: [phone] Office: [phone] 11/25/2023 3:32 PM

## 2023-11-26 ENCOUNTER — Ambulatory Visit
Admission: RE | Admit: 2023-11-26 | Discharge: 2023-11-26 | Disposition: A | Discharge status: Home / Self Care | Payer: Medicare Other | Source: Ambulatory Visit | Attending: Radiation Oncology | Admitting: Radiation Oncology

## 2023-11-26 ENCOUNTER — Other Ambulatory Visit: Payer: Self-pay

## 2023-11-26 DIAGNOSIS — N179 Acute kidney failure, unspecified: Secondary | ICD-10-CM | POA: Diagnosis not present

## 2023-11-26 DIAGNOSIS — C50919 Malignant neoplasm of unspecified site of unspecified female breast: Secondary | ICD-10-CM | POA: Diagnosis not present

## 2023-11-26 DIAGNOSIS — Z170 Estrogen receptor positive status [ER+]: Secondary | ICD-10-CM | POA: Diagnosis not present

## 2023-11-26 DIAGNOSIS — C7951 Secondary malignant neoplasm of bone: Secondary | ICD-10-CM | POA: Diagnosis not present

## 2023-11-26 DIAGNOSIS — Z510 Encounter for antineoplastic radiation therapy: Secondary | ICD-10-CM | POA: Diagnosis not present

## 2023-11-26 DIAGNOSIS — C50412 Malignant neoplasm of upper-outer quadrant of left female breast: Secondary | ICD-10-CM | POA: Diagnosis not present

## 2023-11-26 LAB — RAD ONC ARIA SESSION SUMMARY
Course Elapsed Days: 8
Plan Fractions Treated to Date: 5
Plan Prescribed Dose Per Fraction: 3 Gy
Plan Total Fractions Prescribed: 10
Plan Total Prescribed Dose: 30 Gy
Reference Point Dosage Given to Date: 15 Gy
Reference Point Session Dosage Given: 3 Gy
Session Number: 5

## 2023-11-26 LAB — BASIC METABOLIC PANEL
Anion gap: 6 (ref 5–15)
BUN: 75 mg/dL — ABNORMAL HIGH (ref 8–23)
CO2: 18 mmol/L — ABNORMAL LOW (ref 22–32)
Calcium: 7.8 mg/dL — ABNORMAL LOW (ref 8.9–10.3)
Chloride: 113 mmol/L — ABNORMAL HIGH (ref 98–111)
Creatinine, Ser: 1.84 mg/dL — ABNORMAL HIGH (ref 0.44–1.00)
GFR, Estimated: 26 mL/min — ABNORMAL LOW (ref >60)
Glucose, Bld: 124 mg/dL — ABNORMAL HIGH (ref 70–99)
Potassium: 3 mmol/L — ABNORMAL LOW (ref 3.5–5.1)
Sodium: 137 mmol/L (ref 135–145)

## 2023-11-26 MED ORDER — DILTIAZEM HCL 60 MG PO TABS
60.0000 mg | ORAL_TABLET | Freq: Two times a day (BID) | ORAL | Status: DC
  Administered 2023-11-26: 60 mg via ORAL
  Filled 2023-11-26 (×2): qty 1

## 2023-11-26 MED ORDER — POLYETHYLENE GLYCOL 3350 17 G PO PACK: 17.0000 g | PACK | Freq: Two times a day (BID) | ORAL | Status: DC

## 2023-11-26 MED ORDER — POTASSIUM CHLORIDE 20 MEQ PO PACK
40.0000 meq | PACK | Freq: Once | ORAL | Status: AC
  Administered 2023-11-26: 40 meq via ORAL
  Filled 2023-11-26: qty 2

## 2023-11-26 MED ORDER — FAMOTIDINE 20 MG PO TABS
20.0000 mg | ORAL_TABLET | Freq: Every day | ORAL | Status: DC
Start: 2023-11-26 — End: 2023-12-02
  Administered 2023-11-26 – 2023-12-01 (×6): 20 mg via ORAL
  Filled 2023-11-26 (×7): qty 1

## 2023-11-26 MED ORDER — ZINC OXIDE 40 % EX OINT
TOPICAL_OINTMENT | Freq: Four times a day (QID) | CUTANEOUS | Status: DC | PRN
  Filled 2023-11-26 (×3): qty 57

## 2023-11-26 MED ORDER — SENNA 8.6 MG PO TABS: 1.0000 | ORAL_TABLET | Freq: Every day | ORAL | Status: DC | PRN

## 2023-11-26 NOTE — Progress Notes (Addendum)
 [name]   DOB:[birth year]   ZO#:[id number]      ASSESSMENT & PLAN:  1.  Breast cancer with mets to liver, spine, peritoneum -Initially diagnosed with right-sided breast cancer in 2010.  She is status postlumpectomy, right breast RT and anastrozole which was completed in 2015. 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  Subsequently had a left breast lumpectomy with radioactive seed localization in 2020. -More recently, she is status post Faslodex and abemaciclib 2 weeks ago.  This is currently on hold. - Consideration for continuing oncologic systemic therapy versus palliative care.  May resume therapy if she improves. - Continue radiation therapy as ordered.  - Follows with medical oncology/Dr. Truett Perna   2.  Severe back pain/abdominal pain - Admitted with severe back pain and abdominal pain. Patient reports pain has improved significantly. - Continue judicious pain management   3.  Failure to thrive/dehydration Constipation - Admitted with failure to thrive, dehydration, and constipation. - Reports constipation is much better.     4.  Anemia, macrocytic - CBC stable. Last HGB 9.7, MCV 101.3  - Likely multifactorial - No transfusional intervention warranted at this time - Will continue to monitor CBC with differential   5.  AKI - Creatinine improved somewhat 1.84 and BUN 75  - Avoid nephrotoxic agents - Continue gentle hydration   6.  History of ovarian CA - In 2009.  No recurrence to date.   7.  New onset A-fib -Elevated troponin - Seen by cardiology, not a candidate for [facility].    Code Status DNR-Limited  Subjective:  Patient seen awake and alert sitting up in chair at bedside.  Actually looks much better and smiling today.  States she wants to continue treatments.  Reports her pain has significantly decreased.  Encourage patient to continue range of motion exercises on her arms and legs when she is sitting in a chair and laying in bed.  Agrees to do  so.  Objective:  Vitals:   11/26/23 0608 11/26/23 0820  BP: 136/75 136/75  Pulse: 88 88  Resp: 19   Temp: 98.2 F (36.8 C)   SpO2: 100%      Intake/Output Summary (Last 24 hours) at 11/26/2023 9811 Last data filed at 11/25/2023 1900 Gross per 24 hour  Intake 548.22 ml  Output 1 ml  Net 547.22 ml     REVIEW OF SYSTEMS:   Constitutional: + Fatigue, + improving back pain, denies fevers, chills or abnormal night sweats Eyes: Denies blurriness of vision, double vision or watery eyes Ears, nose, mouth, throat, and face: Denies mucositis or sore throat Respiratory: Denies cough, dyspnea or wheezes Cardiovascular: Denies palpitation, chest discomfort or lower extremity swelling Gastrointestinal: +Improved abdominal pain Skin: Denies abnormal skin rashes Lymphatics: Denies new lymphadenopathy or easy bruising Neurological: Denies numbness, tingling or new weaknesses Behavioral/Psych: Mood is stable, no new changes  All other systems were reviewed with the patient and are negative.  PHYSICAL EXAMINATION: ECOG PERFORMANCE STATUS: 2 - Symptomatic, <50% confined to bed  Vitals:   11/26/23 0608 11/26/23 0820  BP: 136/75 136/75  Pulse: 88 88  Resp: 19   Temp: 98.2 F (36.8 C)   SpO2: 100%    Filed Weights   11/21/23 1525  Weight: 117 lb 15.1 oz (53.5 kg)    GENERAL: alert, no distress and comfortable SKIN: +Pale skin color, texture, turgor are normal, no rashes or significant lesions EYES: normal, conjunctiva are pink and non-injected, sclera clear OROPHARYNX: no exudate, no  erythema and lips, buccal mucosa, and tongue normal  NECK: supple, thyroid normal size, non-tender, without nodularity LYMPH: no palpable lymphadenopathy in the cervical, axillary or inguinal LUNGS: clear to auscultation and percussion with normal breathing effort HEART: regular rate & rhythm and no murmurs and no lower extremity edema ABDOMEN: abdomen soft, non-tender and normal bowel  sounds MUSCULOSKELETAL: no cyanosis of digits and no clubbing  PSYCH: alert & oriented x 3 with fluent speech NEURO: no focal motor/sensory deficits   All questions were answered. The patient knows to call the clinic with any problems, questions or concerns.   The total time spent in the appointment was 40 minutes encounter with patient including review of chart and various tests results, discussions about plan of care and coordination of care plan  Dawson Bills, NP 11/26/2023 8:38 AM    Labs Reviewed:  Lab Results  Component Value Date   WBC 5.2 11/24/2023   HGB 9.7 (L) 11/24/2023   HCT 30.1 (L) 11/24/2023   MCV 101.3 (H) 11/24/2023   PLT 163 11/24/2023   Recent Labs    11/21/23 0840 11/21/23 1611 11/23/23 0648 11/24/23 0711 11/25/23 0520 11/26/23 0535  NA 139   < > 139 139 135 137  K 4.4   < > 3.1* 2.9* 2.8* 3.0*  CL 111   < > 111 114* 111 113*  CO2 19*   < > 17* 15* 17* 18*  GLUCOSE 189*   < > 74 116* 169* 124*  BUN >100*   < > 102* 98* 81* 75*  CREATININE 2.93*   < > 2.37* 2.15* 1.87* 1.84*  CALCIUM 8.2*   < > 7.7* 8.0* 7.7* 7.8*  GFRNONAA 15*   < > 19* 22* 26* 26*  PROT 4.1*  --  4.1* 4.2*  --   --   ALBUMIN 2.6*  --  2.2* 2.3*  --   --   AST 84*  --  88* 37  --   --   ALT 421*  --  568* 382*  --   --   ALKPHOS 110  --  122 111  --   --   BILITOT 0.7  --  1.5* 0.9  --   --    < > = values in this interval not displayed.    Studies Reviewed:  ECHOCARDIOGRAM COMPLETE Result Date: 11/25/2023    ECHOCARDIOGRAM REPORT   Patient Name:   JOELEEN WORTLEY Date of Exam: 11/25/2023 Medical Rec #:  [id number]        Height:       66.0 in Accession #:    [id number]       Weight:       117.9 lb Date of Birth:  1934-05-13       BSA:          1.598 m Patient Age:    89 years         BP:           144/56 mmHg Patient Gender: F                HR:           88 bpm. Exam Location:  Inpatient Procedure: 2D Echo, Cardiac Doppler and Color Doppler (Both Spectral and Color             Flow Doppler were utilized during procedure). Indications:    Chest Pain R07.9  History:        Patient has  prior history of Echocardiogram examinations, most                 recent 03/22/2022. CAD; Risk Factors:Hypertension.  Sonographer:    Webb Laws Referring Phys: [id number] SUNIT TOLIA IMPRESSIONS  1. Severe basal septal hypertrophy on off axis imaging; maximal septal thickness 20 mm. Maximal apical hypertrophy 20 mm. No resting gradient noted. Left ventricular ejection fraction, by estimation, is 65 to 70%. The left ventricle has normal function.  The left ventricle has no regional wall motion abnormalities. There is severe asymmetric left ventricular hypertrophy of the basal-septal segment. Left ventricular diastolic function could not be evaluated.  2. Right ventricular systolic function is normal. The right ventricular size is normal.  3. Left atrial size was mildly dilated.  4. The mitral valve is normal in structure. No evidence of mitral valve regurgitation. No evidence of mitral stenosis. The mean mitral valve gradient is 2.0 mmHg. Moderate mitral annular calcification.  5. The aortic valve is tricuspid. There is mild calcification of the aortic valve. Aortic valve regurgitation is mild. No aortic stenosis is present.  6. The inferior vena cava is normal in size with greater than 50% respiratory variability, suggesting right atrial pressure of 3 mmHg. Comparison(s): Prior images reviewed side by side. Aortic regurgitation not seen in 2023 study. Maximal basal septal thickness 14 mm on better quality long axis view. Apex not well seen in 2023 study. Conclusion(s)/Recommendation(s): Consider outpatient CMR for clarification of hypertrophy if clinically indicated. FINDINGS  Left Ventricle: Severe basal septal hypertrophy on off axis imaging; maximal septal thickness 20 mm. Maximal apical hypertrophy 20 mm. No resting gradient noted. Left ventricular ejection fraction, by estimation, is 65 to 70%. The  left ventricle has normal function. The left ventricle has no regional wall motion abnormalities. Global longitudinal strain performed but not reported based on interpreter judgement due to suboptimal tracking. The left ventricular internal cavity size was normal in size. There is severe asymmetric left ventricular hypertrophy of the basal-septal segment. Left ventricular diastolic function could not be evaluated due to mitral annular calcification (moderate or greater). Left ventricular diastolic function could not be evaluated. Right Ventricle: The right ventricular size is normal. No increase in right ventricular wall thickness. Right ventricular systolic function is normal. Left Atrium: Left atrial size was mildly dilated. Right Atrium: Right atrial size was normal in size. Pericardium: There is no evidence of pericardial effusion. Mitral Valve: The mitral valve is normal in structure. Moderate mitral annular calcification. No evidence of mitral valve regurgitation. No evidence of mitral valve stenosis. The mean mitral valve gradient is 2.0 mmHg. Tricuspid Valve: The tricuspid valve is normal in structure. Tricuspid valve regurgitation is mild . No evidence of tricuspid stenosis. Aortic Valve: The aortic valve is tricuspid. There is mild calcification of the aortic valve. Aortic valve regurgitation is mild. No aortic stenosis is present. Pulmonic Valve: The pulmonic valve was not well visualized. Pulmonic valve regurgitation is not visualized. No evidence of pulmonic stenosis. Aorta: The aortic root and ascending aorta are structurally normal, with no evidence of dilitation. Venous: The inferior vena cava is normal in size with greater than 50% respiratory variability, suggesting right atrial pressure of 3 mmHg. IAS/Shunts: The atrial septum is grossly normal. Additional Comments: 3D imaging was not performed.  LEFT VENTRICLE PLAX 2D LVIDd:         4.00 cm   Diastology LVIDs:         3.20 cm   LV e' medial:  5.11 cm/s LV PW:         1.25 cm   LV E/e' medial:  21.7 LV IVS:        1.10 cm   LV e' lateral:   9.03 cm/s LVOT diam:     1.70 cm   LV E/e' lateral: 12.3 LV SV:         51 LV SV Index:   32 LVOT Area:     2.27 cm  RIGHT VENTRICLE             IVC RV Basal diam:  3.00 cm     IVC diam: 1.40 cm RV S prime:     11.60 cm/s TAPSE (M-mode): 1.5 cm LEFT ATRIUM             Index        RIGHT ATRIUM          Index LA diam:        4.50 cm 2.82 cm/m   RA Area:     7.16 cm LA Vol (A2C):   54.4 ml 34.05 ml/m  RA Volume:   13.10 ml 8.20 ml/m LA Vol (A4C):   47.2 ml 29.54 ml/m LA Biplane Vol: 54.4 ml 34.05 ml/m  AORTIC VALVE LVOT Vmax:   145.50 cm/s LVOT Vmean:  89.250 cm/s LVOT VTI:    0.224 m  AORTA Ao Root diam: 2.70 cm Ao Asc diam:  3.10 cm MITRAL VALVE                TRICUSPID VALVE MV Area (PHT): 4.15 cm     TR Peak grad:   25.0 mmHg MV Mean grad:  2.0 mmHg     TR Vmax:        250.00 cm/s MV Decel Time: 183 msec MV E velocity: 111.00 cm/s  SHUNTS                             Systemic VTI:  0.22 m                             Systemic Diam: 1.70 cm Riley Lam MD Electronically signed by Riley Lam MD Signature Date/Time: 11/25/2023/9:28:52 AM    Final    CT ABDOMEN PELVIS WO CONTRAST Result Date: 11/23/2023 CLINICAL DATA:  Metastatic breast cancer. Acute abdominal pain. Acute kidney injury. * Tracking Code: BO * EXAM: CT ABDOMEN AND PELVIS WITHOUT CONTRAST TECHNIQUE: Multidetector CT imaging of the abdomen and pelvis was performed following the standard protocol without IV contrast. RADIATION DOSE REDUCTION: This exam was performed according to the departmental dose-optimization program which includes automated exposure control, adjustment of the mA and/or kV according to patient size and/or use of iterative reconstruction technique. COMPARISON:  10/16/2023 FINDINGS: Lower chest: Right coronary artery descending thoracic aortic atheromatous vascular calcification. Trace pericardial effusion. Mitral  valve calcification. Small bilateral pleural effusions with passive atelectasis. Small type 1 hiatal hernia. Scattered small pulmonary nodules in the lung bases as on 10/16/2023, probably from metastatic disease. Hepatobiliary: Scattered hepatic hypodense lesions compatible with metastatic disease. A confluence of two lesions posteriorly in the right hepatic lobe measures 4.1 by 2.2 cm on image 8 series 2. A medial hypodense lesion posteriorly in the right hepatic lobe measures 4.4 by 3.1 cm on image 10 series 2, formerly about 4.0 by 2.7 cm. Cholecystectomy. Distal common bile duct proximally 9 mm in diameter,  borderline prominent for age. Pancreas: Atrophic, otherwise unremarkable. Spleen: Unremarkable Adrenals/Urinary Tract: 2.0 by 2.0 cm right adrenal mass, internal density 34 Hounsfield units which is indeterminate. Bilateral nonobstructive renal calculi. Mild right hydronephrosis and proximal hydroureter extending to the iliac vessel cross over, however with no distal hydroureter. Foley catheter in the urinary bladder which is mostly empty although there is a small amount of gas in the lumen of the urinary bladder. Stomach/Bowel: Small periampullary duodenal diverticulum. Large caliber distal colon and rectum containing progressive dilution of contrast medium as well as stool contents. Prior bowel resections include the right colon and at least part of the ileum. Vascular/Lymphatic: Atherosclerosis is present, including aortoiliac atherosclerotic disease. Reproductive: Uterus absent.  Adnexa unremarkable. Other: No supplemental non-categorized findings. Musculoskeletal: We partially include what appears to be a compression fracture and possible lytic lesions involving the T11 vertebral body. Suspected right eleventh rib fracture medially. Lucency eccentric to the left in the T12 vertebral body may be from metastatic disease. Bony destructive findings in the posterior elements at L4 suspicious for osseous  metastatic disease. Grade 1 anterolisthesis at L5-S1. Lucency in the left iliac wing on image 53 series 4 is probably a metastatic lesion. There is also lucency further medially in the left iliac bone on image 77 series 4. IMPRESSION: 1. Mild right hydronephrosis and proximal hydroureter extending to the iliac vessel cross over, however with no distal hydroureter. This could be from a distal ureteral stricture or from a recently passed calculus. 2. Bilateral nonobstructive renal calculi. 3. Scattered hepatic hypodense lesions compatible with metastatic disease. 4. Scattered small pulmonary nodules in the lung bases as on 10/16/2023, probably from metastatic disease. 5. 2.0 by 2.0 cm right adrenal mass, internal density 34 Hounsfield units which is indeterminate but quite likely metastatic. 6. We partially include what appears to be a compression fracture and possible lytic lesions involving the T11 vertebral body. Suspected right eleventh rib fracture medially. Bony destructive findings in the posterior elements at L4 suspicious for osseous metastatic disease. Left iliac lytic lesions suspicious for metastatic disease. 7. Small bilateral pleural effusions with passive atelectasis. 8. Small type 1 hiatal hernia. 9. Aortic and coronary atherosclerosis. Aortic Atherosclerosis (ICD10-I70.0). Electronically Signed   By: Gaylyn Rong M.D.   On: 11/23/2023 17:51   US RENAL Result Date: 11/21/2023 CLINICAL DATA:  Acute kidney injury. EXAM: RENAL / URINARY TRACT ULTRASOUND COMPLETE COMPARISON:  CT 10/16/2023 FINDINGS: Right Kidney: Renal measurements: 10.1 x 4.4 x 4.7 cm = volume: 110 mL. Moderate hydronephrosis is new from prior CT. Mild increased renal parenchymal echogenicity typical of chronic renal disease. Renal calculi on prior CT not well-defined on the current exam. No evidence of focal lesion. Left Kidney: Renal measurements: 9 x 4.1 x 4.3 cm = volume: 83 mL. No hydronephrosis. Thinning of renal parenchyma  with increased echogenicity. Shadowing 9 mm stone. Small renal cysts, largest 1.7 cm. No evidence of suspicious lesion. Bladder: Appears normal for degree of bladder distention. Other: None. IMPRESSION: 1. Moderate right hydronephrosis is new from prior CT. 2. Increased renal parenchymal echogenicity typical of chronic medical renal disease. 3. Left renal stone. Known right renal calculi on prior CT not well seen. Electronically Signed   By: Narda Rutherford M.D.   On: 11/21/2023 22:06   US BIOPSY (LIVER) Result Date: 10/30/2023 INDICATION: History of prior left breast carcinoma. Imaging evidence metastatic disease in the liver, multiple skeletal sites and possibly lungs. The patient presents for liver lesion biopsy. EXAM: ULTRASOUND GUIDED CORE BIOPSY  OF LIVER MEDICATIONS: None. ANESTHESIA/SEDATION: Moderate (conscious) sedation was employed during this procedure. A total of Versed 1.0 mg and Fentanyl 50 mcg was administered intravenously. Moderate Sedation Time: 15 minutes. The patient's level of consciousness and vital signs were monitored continuously by radiology nursing throughout the procedure under my direct supervision. PROCEDURE: The procedure, risks, benefits, and alternatives were explained to the patient. Questions regarding the procedure were encouraged and answered. The patient understands and consents to the procedure. A time-out was performed prior to initiating the procedure. The abdominal wall was prepped with exiting in a sterile fashion, and a sterile drape was applied covering the operative field. A sterile gown and sterile gloves were used for the procedure. Local anesthesia was provided with 1% Lidocaine. Ultrasound was performed to localize liver lesions. A 17 gauge trocar needle was advanced into the liver at the level of a lesion within the right lobe. Three separate coaxial 18 gauge core biopsy samples were obtained and submitted in formalin. Additional ultrasound was performed after  removal of the outer needle. COMPLICATIONS: None immediate. FINDINGS: Multiple rounded hypoechoic mass lesions are seen throughout the liver parenchyma. A lesion within the medial right lobe was chosen for sampling measuring approximately 2.5 x 2.2 x 2.5 cm. Solid tissue was obtained. IMPRESSION: Ultrasound-guided core biopsy performed of a mass lesion within the right lobe of the liver measuring 2.5 cm in maximum diameter. Electronically Signed   By: Irish Lack M.D.   On: 10/30/2023 11:42  Ms. Needs reports feeling better.  Her back pain remains improved.  No new complaint.  Her renal function appears to be slowly improving.  She was spine radiation yesterday. Ms.Abram would like to return home with the help of her daughter and sister if possible.  I recommend a PT evaluation. She would like to resume treatment of the metastatic breast cancer if she continues to improve from the admission with acute renal failure and dehydration.  She was referred to palliative medicine as an outpatient.  We decided to hold on this consult since medical oncology is managing her pain and discussing goals of care.  Recommendations: Replete potassium, monitor renal function Out of bed, PT evaluation Continue palliative radiation to the spine Continue oxycodone for pain Check liver panel 11/27/2023 Oncology will continue following her in the hospital and outpatient follow-up will be scheduled at the Cancer center

## 2023-11-26 NOTE — Progress Notes (Signed)
 PROGRESS NOTE  [name]    DOB: 01/15/1934, 89 y.o.  ZOX:[id number]    Code Status: Limited: Do not attempt resuscitation (DNR) -DNR-LIMITED -Do Not Intubate/DNI    DOA: 11/21/2023   LOS: 5   Brief hospital course  [name] is a 89 y.o. 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 She began palliative radiation to her spine 11/18/2023   11/26/23 -doing well. Feels as though food is getting stuck when she swallows. Denies choking.  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  She appears to be tolerating well with the exception of feeling fatigued. -Consult palliative -Analgesia as needed  AKI-in the setting of CKD stage III, likely due to ATN from hypotension and dehydration. Improving significantly.  Creatinine has improved and been stable at 1.8 for the past couple of days from 3.07 on presentation. -Continue to follow and encourage p.o. hydration   Chest pain-intermittent.  Described as pressure after eating.  Concern for dysphagia -SLP consulted -Patient has history of CAD -Cardiology was consulted, no aggressive intervention recommended   A-fib with RVR-Likely new onset.  Appears to be rate controlled at this time -Continue home Coreg 25 mg p.o. twice daily, which was on hold due to hypotension -Continue Cardizem. -Not a candidate for anticoagulation -Cardiology signed off   History of hypertension -Imdur, amlodipine on hold due to hypotension    Abnormal LFTs -Secondary  to hepatic metastasis   Cancer-related pain -Continue oxycodone, IV morphine as needed   Constipation -Continue bowel regimen   Hypokalemia -Potassium is 2.8 this morning -Replace potassium and follow BMP in am  Body mass index is 19.04 kg/m.  VTE ppx: heparin injection 5,000 Units Start: 11/21/23 1600 SCDs Start: 11/21/23 1502   Diet:     Diet   Diet regular Room service appropriate? Yes; Fluid consistency: Thin   Consultants: Oncology   Subjective 11/26/23    Pt reports feeling improved with the exception of feeling fatigue and some chest tightness when she eats like she has a bubble stuck. She tells me that today is the 5th radiation treatment of 10.    Objective   Vitals:   11/25/23 2200 11/25/23 2300 11/26/23 0000 11/26/23 0608  BP:    136/75  Pulse:    88  Resp: 15 19 (!) 23 19  Temp:    98.2 F (36.8 C)  TempSrc:    Oral  SpO2:    100%  Weight:      Height:        Intake/Output Summary (Last 24 hours) at 11/26/2023 0735 Last data filed at 11/25/2023 1900 Gross per 24 hour  Intake 553.21 ml  Output 1 ml  Net 552.21 ml   Filed Weights   11/21/23 1525  Weight: 53.5 kg     Physical Exam:  General: awake, alert, NAD.  Tired appearing HEENT: atraumatic, clear conjunctiva, anicteric sclera, dry mucous membranes, hearing grossly normal Respiratory: normal respiratory effort.  CTAB Cardiovascular: quick capillary refill, normal S1/S2, RRR, no JVD, murmurs  Gastrointestinal: soft, NT, ND Nervous: A&O x3. no gross focal neurologic deficits, normal speech Extremities: moves all equally, no edema, normal tone Skin: dry, intact, normal temperature, normal color. No rashes, lesions or ulcers on exposed skin Psychiatry: normal mood, congruent affect  Labs   I have personally reviewed the following labs and imaging studies CBC    Component Value Date/Time   WBC 5.2 11/24/2023 0711   RBC 2.97 (L) 11/24/2023 0711   HGB 9.7 (L) 11/24/2023 0711   HGB 10.0  (L) 11/21/2023 0840   HGB 8.8 (L) 02/19/2008 1135   HCT 30.1 (L) 11/24/2023 0711   HCT 26.0 (L) 02/19/2008 1135   PLT 163 11/24/2023 0711   PLT 198 11/21/2023 0840   PLT 244 02/19/2008 1135   MCV 101.3 (H) 11/24/2023 0711   MCV 89.0 02/19/2008 1135   MCH 32.7 11/24/2023 0711   MCHC 32.2 11/24/2023 0711   RDW 15.4 11/24/2023 0711   RDW 22.9 (H) 02/19/2008 1135   LYMPHSABS 0.3 (L) 11/21/2023 0840   LYMPHSABS 0.3 (L) 02/19/2008 1135   MONOABS 0.1 11/21/2023 0840   MONOABS 0.9 02/19/2008 1135   EOSABS 0.0 11/21/2023 0840   EOSABS 0.1 02/19/2008 1135   BASOSABS 0.0 11/21/2023 0840   BASOSABS 0.0 02/19/2008 1135      Latest Ref Rng & Units 11/26/2023    5:35 AM 11/25/2023    5:20 AM 11/24/2023    7:11 AM  BMP  Glucose 70 - 99 mg/dL 161  096  045   BUN 8 - 23 mg/dL 75  81  98   Creatinine 0.44 - 1.00 mg/dL 4.09  8.11  9.14   Sodium 135 - 145 mmol/L 137  135  139   Potassium 3.5 - 5.1 mmol/L 3.0  2.8  2.9   Chloride 98 - 111 mmol/L 113  111  114   CO2 22 - 32 mmol/L 18  17  15    Calcium 8.9 - 10.3 mg/dL 7.8  7.7  8.0     ECHOCARDIOGRAM COMPLETE Result Date: 11/25/2023    ECHOCARDIOGRAM REPORT   Patient Name:   [name] Date of Exam: 11/25/2023 Medical Rec #:  [id number]        Height:       66.0 in Accession #:    [id number]       Weight:       117.9 lb Date of Birth:  12/25/1933       BSA:          1.598 m Patient Age:    89 years         BP:           144/56 mmHg Patient Gender: F                HR:           88 bpm. Exam Location:  Inpatient Procedure: 2D Echo, Cardiac Doppler and Color Doppler (Both Spectral and Color            Flow Doppler were utilized during procedure). Indications:    Chest Pain R07.9  History:        Patient has prior history of Echocardiogram examinations, most                 recent 03/22/2022. CAD; Risk Factors:Hypertension.  Sonographer:    Webb Laws Referring Phys: [id number] SUNIT TOLIA IMPRESSIONS  1. Severe basal septal hypertrophy on off axis  imaging; maximal septal thickness 20  mm. Maximal apical hypertrophy 20 mm. No resting gradient noted. Left ventricular ejection fraction, by estimation, is 65 to 70%. The left ventricle has normal function.  The left ventricle has no regional wall motion abnormalities. There is severe asymmetric left ventricular hypertrophy of the basal-septal segment. Left ventricular diastolic function could not be evaluated.  2. Right ventricular systolic function is normal. The right ventricular size is normal.  3. Left atrial size was mildly dilated.  4. The mitral valve is normal in structure. No evidence of mitral valve regurgitation. No evidence of mitral stenosis. The mean mitral valve gradient is 2.0 mmHg. Moderate mitral annular calcification.  5. The aortic valve is tricuspid. There is mild calcification of the aortic valve. Aortic valve regurgitation is mild. No aortic stenosis is present.  6. The inferior vena cava is normal in size with greater than 50% respiratory variability, suggesting right atrial pressure of 3 mmHg. Comparison(s): Prior images reviewed side by side. Aortic regurgitation not seen in 2023 study. Maximal basal septal thickness 14 mm on better quality long axis view. Apex not well seen in 2023 study. Conclusion(s)/Recommendation(s): Consider outpatient CMR for clarification of hypertrophy if clinically indicated. FINDINGS  Left Ventricle: Severe basal septal hypertrophy on off axis imaging; maximal septal thickness 20 mm. Maximal apical hypertrophy 20 mm. No resting gradient noted. Left ventricular ejection fraction, by estimation, is 65 to 70%. The left ventricle has normal function. The left ventricle has no regional wall motion abnormalities. Global longitudinal strain performed but not reported based on interpreter judgement due to suboptimal tracking. The left ventricular internal cavity size was normal in size. There is severe asymmetric left ventricular hypertrophy of the basal-septal segment.  Left ventricular diastolic function could not be evaluated due to mitral annular calcification (moderate or greater). Left ventricular diastolic function could not be evaluated. Right Ventricle: The right ventricular size is normal. No increase in right ventricular wall thickness. Right ventricular systolic function is normal. Left Atrium: Left atrial size was mildly dilated. Right Atrium: Right atrial size was normal in size. Pericardium: There is no evidence of pericardial effusion. Mitral Valve: The mitral valve is normal in structure. Moderate mitral annular calcification. No evidence of mitral valve regurgitation. No evidence of mitral valve stenosis. The mean mitral valve gradient is 2.0 mmHg. Tricuspid Valve: The tricuspid valve is normal in structure. Tricuspid valve regurgitation is mild . No evidence of tricuspid stenosis. Aortic Valve: The aortic valve is tricuspid. There is mild calcification of the aortic valve. Aortic valve regurgitation is mild. No aortic stenosis is present. Pulmonic Valve: The pulmonic valve was not well visualized. Pulmonic valve regurgitation is not visualized. No evidence of pulmonic stenosis. Aorta: The aortic root and ascending aorta are structurally normal, with no evidence of dilitation. Venous: The inferior vena cava is normal in size with greater than 50% respiratory variability, suggesting right atrial pressure of 3 mmHg. IAS/Shunts: The atrial septum is grossly normal. Additional Comments: 3D imaging was not performed.  LEFT VENTRICLE PLAX 2D LVIDd:         4.00 cm   Diastology LVIDs:         3.20 cm   LV e' medial:    5.11 cm/s LV PW:         1.25 cm   LV E/e' medial:  21.7 LV IVS:        1.10 cm   LV e' lateral:   9.03 cm/s LVOT diam:     1.70 cm   LV E/e' lateral:  12.3 LV SV:         51 LV SV Index:   32 LVOT Area:     2.27 cm  RIGHT VENTRICLE             IVC RV Basal diam:  3.00 cm     IVC diam: 1.40 cm RV S prime:     11.60 cm/s TAPSE (M-mode): 1.5 cm LEFT ATRIUM              Index        RIGHT ATRIUM          Index LA diam:        4.50 cm 2.82 cm/m   RA Area:     7.16 cm LA Vol (A2C):   54.4 ml 34.05 ml/m  RA Volume:   13.10 ml 8.20 ml/m LA Vol (A4C):   47.2 ml 29.54 ml/m LA Biplane Vol: 54.4 ml 34.05 ml/m  AORTIC VALVE LVOT Vmax:   145.50 cm/s LVOT Vmean:  89.250 cm/s LVOT VTI:    0.224 m  AORTA Ao Root diam: 2.70 cm Ao Asc diam:  3.10 cm MITRAL VALVE                TRICUSPID VALVE MV Area (PHT): 4.15 cm     TR Peak grad:   25.0 mmHg MV Mean grad:  2.0 mmHg     TR Vmax:        250.00 cm/s MV Decel Time: 183 msec MV E velocity: 111.00 cm/s  SHUNTS                             Systemic VTI:  0.22 m                             Systemic Diam: 1.70 cm Riley Lam MD Electronically signed by Riley Lam MD Signature Date/Time: 11/25/2023/9:28:52 AM    Final     Disposition Plan & Communication  Patient status: Inpatient  Admitted From: Home Planned disposition location: Home Anticipated discharge date: In 5 days pending completion of palliative radiation  Family Communication: None at bedside.  Daughter called asking about prognosis and directed her to speak with oncology about this.   Author: Leeroy Bock, DO [facility] 11/26/2023, 7:35 AM   Available by Epic secure chat 7AM-7PM. If 7PM-7AM, please contact night-coverage.  [facility] contact information found on [email or web address].

## 2023-11-26 NOTE — TOC Initial Note (Signed)
 Transition of Care [facility]) - Initial/Assessment Note    Patient Details  Name: [name] MRN: [id number] Date of Birth: June 09, 1934  [facility]) CM/SW Contact:    Lanier Clam, RN Phone Number: 11/26/2023, 3:04 PM  Clinical Narrative: Sherron Monday to dtr Eber Jones about d/c plans-Carolyn concerned with resources-provided her with patient's insurance id#, & tel# to [employer]. PT eval await recc. Noted for GOC in am. Continue to monitor for d/c plans.                 Expected Discharge Plan: Home w Home Health Services Barriers to Discharge: Continued Medical Work up   Patient Goals and CMS Choice Patient states their goals for this hospitalization and ongoing recovery are:: Home or rehab CMS Medicare.gov Compare Post Acute Care list provided to:: Patient Represenative (must comment) (Carolyn(dtr)) Choice offered to / list presented to : Adult Children [place] ownership interest in [facility].provided to:: Adult Children    Expected Discharge Plan and Services   Discharge Planning Services: CM Consult Post Acute Care Choice: Home Health Living arrangements for the past 2 months: Single Family Home                                      Prior Living Arrangements/Services Living arrangements for the past 2 months: Single Family Home Lives with:: Self                   Activities of Daily Living   ADL Screening (condition at time of admission) Independently performs ADLs?: Yes (appropriate for developmental age) Is the patient deaf or have difficulty hearing?: No Does the patient have difficulty seeing, even when wearing glasses/contacts?: No Does the patient have difficulty concentrating, remembering, or making decisions?: No  Permission Sought/Granted                  Emotional Assessment              Admission diagnosis:  AKI (acute kidney injury) (HCC) [N17.9] Patient Active Problem List   Diagnosis  Date Noted   New onset a-fib (HCC) 11/24/2023   Precordial pain 11/24/2023   Elevated troponin 11/24/2023   Anemia 11/24/2023   Malignant neoplasm of ovary (HCC) 11/24/2023   AKI (acute kidney injury) (HCC) 11/21/2023   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) 11/12/2023   Infection of hand 02/02/2022   Genetic testing 07/20/2019   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/20/2019   Family history of breast cancer    Carcinoma of upper-outer quadrant of left breast in female, estrogen receptor positive (HCC) 05/19/2019   Left carotid bruit 01/21/2017   Hypertension    Hyperlipidemia    GERD (gastroesophageal reflux disease)    Ureteral calculi    History of breast cancer    History of ovarian cancer    History of skin cancer    Frequency of urination    Dysuria    Kidney stones    CAD (coronary artery disease)    Ductal carcinoma (HCC) 08/11/2013   Serous tumor, 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   PCP:  Lorenda Ishihara, MD Pharmacy:   [facility] [phone] [place] Otto, [place] - [address] AT [facility] [address] [place] [address] Phone: [phone] Fax: [phone]     Social Drivers of Health (SDOH) Social History: SDOH Screenings   Food  Insecurity: No Food Insecurity (11/21/2023)  Housing: Low Risk  (11/21/2023)  Transportation Needs: No Transportation Needs (11/21/2023)  Utilities: Not At Risk (11/21/2023)  Depression (PHQ2-9): Low Risk  (11/11/2023)  Social Connections: Moderately Integrated (11/21/2023)  Tobacco Use: Low Risk  (11/21/2023)   SDOH Interventions:     Readmission Risk Interventions    11/23/2023   11:56 AM  Readmission Risk Prevention Plan  Transportation Screening Complete  PCP or Specialist Appt within 5-7 Days Complete  Home Care Screening Complete  Medication Review (RN CM) Complete

## 2023-11-26 NOTE — Progress Notes (Signed)
 Cardiology following peripherally.  Temp:  [97.6 F (36.4 C)-98.2 F (36.8 C)] 97.6 F (36.4 C) (02/19 1213) Pulse Rate:  [71-101] 101 (02/19 1213) Cardiac Rhythm: Atrial fibrillation (02/19 0711) Resp:  [14-25] 20 (02/19 1213) BP: (117-136)/(55-75) 117/56 (02/19 1213) SpO2:  [98 %-100 %] 100 % (02/19 1213)  Patient has received thus far Cardizem 30 mg p.o. 3 times daily-3 doses.   Ventricular rate remains relatively stable We will increase Cardizem to 60 mg p.o. twice daily with holding parameters.  Can be transition to long-acting closer to discharge.  Called the patient as daughter Eber Jones and I was on speaker phone with her husband in the background.  I informed them Ms. Bluett has new diagnosis of A-fib which involves rate control, rhythm control, and anticoagulation/blood thinner to minimize the risk of stroke.  So far pursuing rate control strategy with AV nodal blocking agent such as carvedilol and Cardizem.  Given her CHA2DS2-VASc score she is at high risk for thromboembolic event.  Please refer to yesterday's progress note 11/25/2023 which outlines my concerns for long-term anticoagulation.  These were discussed with the patient's daughter over the phone.  They too agree that the risk of blood thinners are greater than the benefit at this time.  And patient also verbalized that she does not want to be on a blood thinner.   If and when she decides to be on a blood thinner she will need gastroenterology and hematology evaluation to make sure there is no reversible cause of her anemia.  Not being on a blood thinner patient and daughter are aware that she is at higher risk of stroke (compared to cohorts that are on blood thinners) and therefore should be more cognizant of focal neurological deficits and if present should come to the closest ER via EMS.  Carolyn's questions and concerns were addressed to her satisfaction.  Please reach out if any questions or concerns  arise.  No Charge.   Rydan Gulyas [place], DO, [facility] 3:06 PM  11/26/23

## 2023-11-27 ENCOUNTER — Other Ambulatory Visit: Payer: Self-pay

## 2023-11-27 ENCOUNTER — Ambulatory Visit
Admission: RE | Admit: 2023-11-27 | Discharge: 2023-11-27 | Disposition: A | Discharge status: Home / Self Care | Payer: Medicare Other | Source: Ambulatory Visit | Attending: Radiation Oncology | Admitting: Radiation Oncology

## 2023-11-27 DIAGNOSIS — I4891 Unspecified atrial fibrillation: Secondary | ICD-10-CM | POA: Diagnosis not present

## 2023-11-27 DIAGNOSIS — C50919 Malignant neoplasm of unspecified site of unspecified female breast: Secondary | ICD-10-CM | POA: Diagnosis not present

## 2023-11-27 DIAGNOSIS — E876 Hypokalemia: Secondary | ICD-10-CM | POA: Diagnosis not present

## 2023-11-27 DIAGNOSIS — Z510 Encounter for antineoplastic radiation therapy: Secondary | ICD-10-CM | POA: Diagnosis not present

## 2023-11-27 DIAGNOSIS — C7951 Secondary malignant neoplasm of bone: Secondary | ICD-10-CM | POA: Diagnosis not present

## 2023-11-27 DIAGNOSIS — Z7189 Other specified counseling: Secondary | ICD-10-CM

## 2023-11-27 DIAGNOSIS — Z515 Encounter for palliative care: Secondary | ICD-10-CM

## 2023-11-27 DIAGNOSIS — R531 Weakness: Secondary | ICD-10-CM | POA: Diagnosis not present

## 2023-11-27 DIAGNOSIS — N179 Acute kidney failure, unspecified: Secondary | ICD-10-CM | POA: Diagnosis not present

## 2023-11-27 DIAGNOSIS — M6281 Muscle weakness (generalized): Secondary | ICD-10-CM | POA: Diagnosis not present

## 2023-11-27 DIAGNOSIS — C50412 Malignant neoplasm of upper-outer quadrant of left female breast: Secondary | ICD-10-CM | POA: Diagnosis not present

## 2023-11-27 DIAGNOSIS — R5381 Other malaise: Secondary | ICD-10-CM | POA: Diagnosis not present

## 2023-11-27 DIAGNOSIS — G2581 Restless legs syndrome: Secondary | ICD-10-CM | POA: Diagnosis not present

## 2023-11-27 DIAGNOSIS — D7589 Other specified diseases of blood and blood-forming organs: Secondary | ICD-10-CM | POA: Diagnosis not present

## 2023-11-27 DIAGNOSIS — Z170 Estrogen receptor positive status [ER+]: Secondary | ICD-10-CM | POA: Diagnosis not present

## 2023-11-27 DIAGNOSIS — F320 Major depressive disorder, single episode, mild: Secondary | ICD-10-CM | POA: Diagnosis not present

## 2023-11-27 LAB — HEPATIC FUNCTION PANEL
ALT: 131 U/L — ABNORMAL HIGH (ref 0–44)
AST: 16 U/L (ref 15–41)
Albumin: 2.3 g/dL — ABNORMAL LOW (ref 3.5–5.0)
Alkaline Phosphatase: 76 U/L (ref 38–126)
Bilirubin, Direct: 0.2 mg/dL (ref 0.0–0.2)
Indirect Bilirubin: 0.6 mg/dL (ref 0.3–0.9)
Total Bilirubin: 0.8 mg/dL (ref 0.0–1.2)
Total Protein: 4 g/dL — ABNORMAL LOW (ref 6.5–8.1)

## 2023-11-27 LAB — RAD ONC ARIA SESSION SUMMARY
Course Elapsed Days: 9
Plan Fractions Treated to Date: 6
Plan Prescribed Dose Per Fraction: 3 Gy
Plan Total Fractions Prescribed: 10
Plan Total Prescribed Dose: 30 Gy
Reference Point Dosage Given to Date: 18 Gy
Reference Point Session Dosage Given: 3 Gy
Session Number: 6

## 2023-11-27 LAB — CBC
HCT: 25.5 % — ABNORMAL LOW (ref 36.0–46.0)
Hemoglobin: 8.3 g/dL — ABNORMAL LOW (ref 12.0–15.0)
MCH: 33.3 pg (ref 26.0–34.0)
MCHC: 32.5 g/dL (ref 30.0–36.0)
MCV: 102.4 fL — ABNORMAL HIGH (ref 80.0–100.0)
Platelets: 78 10*3/uL — ABNORMAL LOW (ref 150–400)
RBC: 2.49 MIL/uL — ABNORMAL LOW (ref 3.87–5.11)
RDW: 15.4 % (ref 11.5–15.5)
WBC: 4.1 10*3/uL (ref 4.0–10.5)
nRBC: 0 % (ref 0.0–0.2)

## 2023-11-27 LAB — BASIC METABOLIC PANEL
Anion gap: 8 (ref 5–15)
BUN: 69 mg/dL — ABNORMAL HIGH (ref 8–23)
CO2: 20 mmol/L — ABNORMAL LOW (ref 22–32)
Calcium: 7.9 mg/dL — ABNORMAL LOW (ref 8.9–10.3)
Chloride: 110 mmol/L (ref 98–111)
Creatinine, Ser: 1.78 mg/dL — ABNORMAL HIGH (ref 0.44–1.00)
GFR, Estimated: 27 mL/min — ABNORMAL LOW (ref >60)
Glucose, Bld: 136 mg/dL — ABNORMAL HIGH (ref 70–99)
Potassium: 3 mmol/L — ABNORMAL LOW (ref 3.5–5.1)
Sodium: 138 mmol/L (ref 135–145)

## 2023-11-27 MED ORDER — POTASSIUM CHLORIDE 10 MEQ/100ML IV SOLN
10.0000 meq | INTRAVENOUS | Status: AC
  Administered 2023-11-27 (×5): 10 meq via INTRAVENOUS
  Filled 2023-11-27 (×6): qty 100

## 2023-11-27 MED ORDER — MAGIC MOUTHWASH W/LIDOCAINE
5.0000 mL | Freq: Three times a day (TID) | ORAL | Status: AC
  Administered 2023-11-27 – 2023-11-29 (×6): 5 mL via ORAL
  Filled 2023-11-27 (×7): qty 5

## 2023-11-27 MED ORDER — DILTIAZEM HCL ER 60 MG PO CP12
60.0000 mg | ORAL_CAPSULE | Freq: Two times a day (BID) | ORAL | Status: DC
  Filled 2023-11-27: qty 1

## 2023-11-27 MED ORDER — DILTIAZEM HCL ER 90 MG PO CP12
90.0000 mg | ORAL_CAPSULE | Freq: Two times a day (BID) | ORAL | Status: DC
  Administered 2023-11-27 – 2023-12-04 (×15): 90 mg via ORAL
  Filled 2023-11-27 (×15): qty 1

## 2023-11-27 MED ORDER — POTASSIUM CHLORIDE 20 MEQ PO PACK: 40.0000 meq | PACK | Freq: Once | ORAL | Status: DC

## 2023-11-27 NOTE — Evaluation (Signed)
 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   Breast cancer (HCC) 2010   RIGHT   CAD (coronary artery disease)    Dysuria    Family history of breast cancer    GERD (gastroesophageal reflux disease)    HX OF   Heart murmur    History of blood transfusion    History of breast cancer ONCOLOGIST-- DR Truett Perna   DX 2010--  RIGHT BREAST DCIS  HIGH GRADE S/P LUMPECTOMY W/ SLN BX--  NO RECURRENCE   History of kidney stones    History of ovarian cancer    2009--  S/P COLON RESECTION FOR PAPILLARY SEROUS CARCINOMA, BORDERLINE OVARIAN CANCER VERSUS PRIMARY PERITONEAL CARCINOMA WITH PSAMMOMA BODIES--  NO RECURRENCE   History of skin cancer    excision basal cell   Hyperlipidemia    Hypertension    Kidney stones 7/14   Nocturia    Ovarian carcinoma (HCC)    papillary serous-low grade, recurrence found in fat necrosis during ileostomy   Renal stones 2014   Shingles 30 YRS AGO   Ureteral calculi    bilateral   Past Surgical History:  Past Surgical History:  Procedure Laterality Date   APPENDECTOMY     BREAST LUMPECTOMY Right 04-13-2009   W/ SLN BX   BREAST LUMPECTOMY WITH RADIOACTIVE SEED LOCALIZATION Left 06/11/2019   Procedure: LEFT BREAST LUMPECTOMY WITH RADIOACTIVE SEED LOCALIZATION;  Surgeon: Griselda Miner, MD;  Location: [facility];  Service: General;  Laterality: Left;   CARDIAC CATHETERIZATION  11-11-2005 DR Verdis Prime   MODERATE LAD DISEASE/ NORMAL LVF/ EF 65-75%   CATARACT EXTRACTION W/ INTRAOCULAR LENS IMPLANT Bilateral 04/26/2013   CHOLECYSTECTOMY  1990   OPEN   COLONOSCOPY     CYSTOSCOPY W/ URETERAL STENT PLACEMENT  Bilateral 05/07/2013   Procedure: CYSTOSCOPY WITH STENT REPLACEMENTS;  Surgeon: Sebastian Ache, MD;  Location: [facility];  Service: Urology;  Laterality: Bilateral;   CYSTOSCOPY WITH BIOPSY N/A 04/07/2013   Procedure: CYSTOSCOPY WITH BIOPSY and fulgeration;  Surgeon: Sebastian Ache, MD;  Location: [facility];  Service: Urology;  Laterality: N/A;   CYSTOSCOPY WITH RETROGRADE PYELOGRAM, URETEROSCOPY AND STENT PLACEMENT Bilateral 05/07/2013   Procedure: CYSTOSCOPY WITH RETROGRADE PYELOGRAM, URETEROSCOPY ;  Surgeon: Sebastian Ache, MD;  Location: [facility];  Service: Urology;  Laterality: Bilateral;   CYSTOSCOPY WITH RETROGRADE PYELOGRAM, URETEROSCOPY AND STENT PLACEMENT Bilateral 01/06/2019   Procedure: CYSTOSCOPY WITH RETROGRADE PYELOGRAM, URETEROSCOPY AND STENT PLACEMENT, FIRST STAGE;  Surgeon: Sebastian Ache, MD;  Location: [facility];  Service: Urology;  Laterality: Bilateral;   CYSTOSCOPY WITH RETROGRADE PYELOGRAM, URETEROSCOPY AND STENT PLACEMENT Bilateral 01/27/2019   Procedure: CYSTOSCOPY WITH RETROGRADE PYELOGRAM, URETEROSCOPY AND STENT PLACEMENT;  Surgeon: Sebastian Ache, MD;  Location: [facility];  Service: Urology;  Laterality: Bilateral;  75 MINS   CYSTOSCOPY WITH STENT PLACEMENT Bilateral 04/07/2013   Procedure: CYSTOSCOPY WITH STENT PLACEMENT;  Surgeon: Sebastian Ache, MD;  Location: [facility];  Service: Urology;  Laterality: Bilateral;   DX LAPAROSCOPY W/ PERITONEAL AND OMENTAL BX'S AND WASHINGS  02-16-2008   EXCISION RIGHT BREAST MASS  02-10-2002   EXP. LAP. EXTENSIVE ADHESIOLYSIS/ RESECTION TERMINAL  ILEUM , ASCENDING AND DESCENDING COLON WITH CREATION ILEOSTOMY AND MUCOUS FISTULA  02-19-2008   PERFERATION AND ABD. CANCER--  TAKEDOWN ILEOSTOMY 08-23-2008   HOLMIUM LASER APPLICATION Bilateral 01/06/2019   Procedure: HOLMIUM LASER APPLICATION;  Surgeon: Sebastian Ache, MD;  Location: [facility];  Service: Urology;  Laterality: Bilateral;   HOLMIUM LASER APPLICATION Bilateral  01/27/2019   Procedure: HOLMIUM LASER APPLICATION;  Surgeon: Sebastian Ache, MD;  Location: [facility];  Service: Urology;  Laterality: Bilateral;   I & D EXTREMITY Right 02/02/2022   Procedure: IRRIGATION AND DEBRIDEMENT HAND;  Surgeon: Bradly Bienenstock, MD;  Location: [facility];  Service: Orthopedics;  Laterality: Right;   ILEOSTOMY CLOSURE  08/2008   TOTAL ABDOMINAL HYSTERECTOMY W/ BILATERAL SALPINGOOPHORECTOMY  1976   TOTAL ABDOMINAL HYSTERECTOMY W/ BILATERAL SALPINGOOPHORECTOMY  1976   VULVAR LESION REMOVAL N/A 08/22/2014   Procedure: EXCISION OF VULVAR CYST  ;  Surgeon: Annamaria Boots, MD;  Location: [facility];  Service: Gynecology;  Laterality: N/A;   WOUND DEBRIDEMENT  05/28/2012   Procedure: DEBRIDEMENT ABDOMINAL WOUND;  Surgeon: Currie Paris, MD;  Location: [facility];  Service: General;  Laterality: N/A;  excision chronic wound abdominal wall   HPI:  [name] is a 89 y.o. 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 She began palliative radiation to her spine 11/18/2023.  MD ordered swallow eval on 2/20 at 1230.    Assessment / Plan / Recommendation  Clinical Impression  Patient seen for swallow evaluation as ordered by Dr Dareen Piano on 2/20 at 1235.  Pt with slight facial asymmetry on right - which is baseline per her self observation in her phone camera.  Otherwise no focal CN deficits.  Pt endorses pain with swallowing stating it feels like a "coke bubble" in her esophagus but denies regurgitating food or drink.  She states her pain symptoms can occur with first bolus but usually occurs after a few sips/bites.  Observed her consuming water and banana.  She is piecemealing liquid boluses - requiring up to 3 swallows to clear single bolus. States that piecemealing is intentional due to concern for  discomfort.  Of note, she reports having prior endoscopy in 2009 and prior barium swallow study - but SLP could not locate any results in chart.  Pt also endorses h/o GERD and using Nexium in the past (maybe approx 15 years ago).  She slowly weaned herself off this medication and denies current symptoms being consisent.   Recommend consider esophagram to allow instrumental evaluation if MD agrees, especially given pt report of some issues for weeks that progressed prior to XRT initiation.  Continue to recommend consideration for magic mouthwash to help with pt's odynophagia to help her maximize po intake.  will follow up x1 - if indicated- if pt has esophagram.  Pt willing to have esophagram conducted if MD orders and SLP informed her she would need to try to swallow at least 1 cup of barium sequentially for optimal test.  Recommend continue diet as tolerated. SLP Visit Diagnosis: Dysphagia, oropharyngeal phase (R13.12)    Aspiration Risk  Mild aspiration risk    Diet Recommendation Regular;Thin liquid    Liquid Administration via: Cup;Straw Medication Administration: Other (Comment) (as tolerated) Supervision: Patient able to self feed Compensations: Slow rate;Small sips/bites Postural Changes: Seated upright at 90 degrees    Other  Recommendations Oral Care Recommendations: Oral care BID    Recommendations for follow up therapy are one component of a multi-disciplinary discharge planning process, led by the attending physician.  Recommendations may be updated based on patient status, additional functional criteria and insurance authorization.  Follow up Recommendations No SLP follow up      Assistance Recommended at Discharge  N/a  Functional Status Assessment Patient has had a recent decline in their functional status and demonstrates the ability to make significant improvements in function in a reasonable and predictable amount of time.  Frequency and Duration min 1 x/week  1 week        Prognosis Prognosis for improved oropharyngeal function: Good      Swallow Study   General Date of Onset: 11/27/23 HPI: MACEL YEARSLEY is a 89 y.o. 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 She began palliative radiation to her spine 11/18/2023.  MD ordered swallow eval on 2/20 at 1230. Type of Study: Bedside Swallow Evaluation Diet Prior to this Study: Regular;Thin liquids (Level 0) Temperature Spikes Noted: No Respiratory Status: Room air History of Recent Intubation: No Behavior/Cognition: Alert;Cooperative;Pleasant mood Oral Cavity Assessment: Within Functional Limits Oral Care Completed by SLP: No Oral Cavity - Dentition: Adequate natural dentition Vision: Functional for self-feeding Self-Feeding Abilities: Able to feed self Patient Positioning: Upright in bed Baseline Vocal Quality: Low vocal intensity Volitional Cough: Weak Volitional Swallow: Able to elicit    Oral/Motor/Sensory Function Overall Oral Motor/Sensory Function: Other (comment) (slightly decreased movement of left eye brow, able to seal lips bilaterally to hold air inmouth)   Ice Chips Ice chips: Not tested   Thin Liquid Thin Liquid: Impaired Presentation: Straw Oral Phase Impairments: Other (comment) (piecemealing for compensation) Pharyngeal  Phase Impairments: Throat Clearing - Delayed    Nectar Thick Nectar Thick Liquid: Not tested   Honey Thick Honey Thick Liquid: Not tested   Puree Puree: Not tested   Solid     Solid: Within functional limits Presentation: Self Orvan July 11/27/2023,3:09 PM  Rolena Infante, MS [facility] SLP Acute Rehab Services Office [phone]

## 2023-11-27 NOTE — Progress Notes (Addendum)
 [name]   DOB:September 19, 1934   ZO#:[id number]      ASSESSMENT & PLAN:  1.  Breast cancer with mets to liver, spine, peritoneum -Initially diagnosed with right-sided breast cancer in 2010.  She is status postlumpectomy, right breast RT and anastrozole which was completed in 2015. 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  Subsequently had a left breast lumpectomy with radioactive seed localization in 2020. -More recently, status post Faslodex and abemaciclib 2 weeks ago.  This is currently on hold. - Consideration for continuing oncologic systemic therapy versus palliative care.  May resume therapy if she improves.  Continue to encourage patient to stay out of bed and increase her food intake.  She is agreeable to doing so. - Continue radiation therapy, tolerating well - Follows with medical oncology/Dr. Truett Perna.  Continue follow-up as outpatient upon discharge.   2.  Severe back pain/abdominal pain -Improving significantly per patient - Admitted with severe back pain and abdominal pain. - Continue judicious pain management. - Continue PT   3.  Failure to thrive/dehydration Constipation - Admitted with failure to thrive, dehydration, and constipation. -Improving   4.  Anemia, macrocytic - CBC done, hemoglobin stable 8.3 today   - Likely multifactorial - No transfusional intervention warranted at this time - Will continue to monitor CBC with differential   5.  AKI - Creatinine improving slightly 1.78   - Avoid nephrotoxic agents - Continue gentle hydration   6.  History of ovarian CA - In 2009.  No recurrence to date.   7.  New onset A-fib -Elevated troponin - Seen by cardiology, not a candidate for [facility].        Code Status DNR-limited  Subjective:  Patient seen awake and alert laying in bed at 45 degree angle.  Reports that she feels better and that the pain is much better.  Discussed with patient that she needs to stay out of bed more and increase her intake of  food.  She agrees to do so.  Objective:  Vitals:   11/27/23 0445 11/27/23 0933  BP: (!) 144/55 (!) 144/55  Pulse: 85 85  Resp: 20   Temp: 97.8 F (36.6 C)   SpO2: 98%      Intake/Output Summary (Last 24 hours) at 11/27/2023 1109 Last data filed at 11/26/2023 2200 Gross per 24 hour  Intake 240 ml  Output --  Net 240 ml     REVIEW OF SYSTEMS:   Constitutional: +Fatigue, +improved back pain  eyes: Denies blurriness of vision, double vision or watery eyes Ears, nose, mouth, throat, and face: Denies mucositis or sore throat Respiratory: Denies cough, dyspnea or wheezes Cardiovascular: Denies palpitation, chest discomfort or lower extremity swelling Gastrointestinal: +Improved abdominal pain Skin: Denies abnormal skin rashes Lymphatics: Denies new lymphadenopathy or easy bruising Neurological: Denies numbness, tingling or new weaknesses Behavioral/Psych: Mood is stable, no new changes  All other systems were reviewed with the patient and are negative.  PHYSICAL EXAMINATION: ECOG PERFORMANCE STATUS: 2 - Symptomatic, <50% confined to bed  Vitals:   11/27/23 0445 11/27/23 0933  BP: (!) 144/55 (!) 144/55  Pulse: 85 85  Resp: 20   Temp: 97.8 F (36.6 C)   SpO2: 98%    Filed Weights   11/21/23 1525  Weight: 117 lb 15.1 oz (53.5 kg)    GENERAL: alert, no distress and comfortable SKIN: skin color, texture, turgor are normal, no rashes or significant lesions EYES: normal, conjunctiva are pink and non-injected, sclera clear OROPHARYNX:  no exudate, no erythema and lips, buccal mucosa, and tongue normal  NECK: supple, thyroid normal size, non-tender, without nodularity LYMPH: no palpable lymphadenopathy in the cervical, axillary or inguinal LUNGS: clear to auscultation and percussion with normal breathing effort HEART: regular rate & rhythm and no murmurs and no lower extremity edema ABDOMEN: abdomen soft, non-tender and normal bowel sounds MUSCULOSKELETAL: no cyanosis of  digits and no clubbing  PSYCH: alert & oriented x 3 with fluent speech NEURO: no focal motor/sensory deficits   All questions were answered. The patient knows to call the clinic with any problems, questions or concerns.   The total time spent in the appointment was 40 minutes encounter with patient including review of chart and various tests results, discussions about plan of care and coordination of care plan  Dawson Bills, NP 11/27/2023 11:09 AM    Labs Reviewed:  Lab Results  Component Value Date   WBC 4.1 11/27/2023   HGB 8.3 (L) 11/27/2023   HCT 25.5 (L) 11/27/2023   MCV 102.4 (H) 11/27/2023   PLT 78 (L) 11/27/2023   Recent Labs    11/23/23 0648 11/24/23 0711 11/25/23 0520 11/26/23 0535 11/27/23 0516  NA 139 139 135 137 138  K 3.1* 2.9* 2.8* 3.0* 3.0*  CL 111 114* 111 113* 110  CO2 17* 15* 17* 18* 20*  GLUCOSE 74 116* 169* 124* 136*  BUN 102* 98* 81* 75* 69*  CREATININE 2.37* 2.15* 1.87* 1.84* 1.78*  CALCIUM 7.7* 8.0* 7.7* 7.8* 7.9*  GFRNONAA 19* 22* 26* 26* 27*  PROT 4.1* 4.2*  --   --  4.0*  ALBUMIN 2.2* 2.3*  --   --  2.3*  AST 88* 37  --   --  16  ALT 568* 382*  --   --  131*  ALKPHOS 122 111  --   --  76  BILITOT 1.5* 0.9  --   --  0.8  BILIDIR  --   --   --   --  0.2  IBILI  --   --   --   --  0.6    Studies Reviewed:  ECHOCARDIOGRAM COMPLETE Result Date: 11/25/2023    ECHOCARDIOGRAM REPORT   Patient Name:   [name] Date of Exam: 11/25/2023 Medical Rec #:  [id number]        Height:       66.0 in Accession #:    [id number]       Weight:       117.9 lb Date of Birth:  November 22, 1933       BSA:          1.598 m Patient Age:    89 years         BP:           144/56 mmHg Patient Gender: F                HR:           88 bpm. Exam Location:  Inpatient Procedure: 2D Echo, Cardiac Doppler and Color Doppler (Both Spectral and Color            Flow Doppler were utilized during procedure). Indications:    Chest Pain R07.9  History:        Patient has prior  history of Echocardiogram examinations, most                 recent 03/22/2022. CAD; Risk Factors:Hypertension.  Sonographer:    Joelene Millin  Mathieu Referring Phys: [id number] SUNIT TOLIA IMPRESSIONS  1. Severe basal septal hypertrophy on off axis imaging; maximal septal thickness 20 mm. Maximal apical hypertrophy 20 mm. No resting gradient noted. Left ventricular ejection fraction, by estimation, is 65 to 70%. The left ventricle has normal function.  The left ventricle has no regional wall motion abnormalities. There is severe asymmetric left ventricular hypertrophy of the basal-septal segment. Left ventricular diastolic function could not be evaluated.  2. Right ventricular systolic function is normal. The right ventricular size is normal.  3. Left atrial size was mildly dilated.  4. The mitral valve is normal in structure. No evidence of mitral valve regurgitation. No evidence of mitral stenosis. The mean mitral valve gradient is 2.0 mmHg. Moderate mitral annular calcification.  5. The aortic valve is tricuspid. There is mild calcification of the aortic valve. Aortic valve regurgitation is mild. No aortic stenosis is present.  6. The inferior vena cava is normal in size with greater than 50% respiratory variability, suggesting right atrial pressure of 3 mmHg. Comparison(s): Prior images reviewed side by side. Aortic regurgitation not seen in 2023 study. Maximal basal septal thickness 14 mm on better quality long axis view. Apex not well seen in 2023 study. Conclusion(s)/Recommendation(s): Consider outpatient CMR for clarification of hypertrophy if clinically indicated. FINDINGS  Left Ventricle: Severe basal septal hypertrophy on off axis imaging; maximal septal thickness 20 mm. Maximal apical hypertrophy 20 mm. No resting gradient noted. Left ventricular ejection fraction, by estimation, is 65 to 70%. The left ventricle has normal function. The left ventricle has no regional wall motion abnormalities. Global  longitudinal strain performed but not reported based on interpreter judgement due to suboptimal tracking. The left ventricular internal cavity size was normal in size. There is severe asymmetric left ventricular hypertrophy of the basal-septal segment. Left ventricular diastolic function could not be evaluated due to mitral annular calcification (moderate or greater). Left ventricular diastolic function could not be evaluated. Right Ventricle: The right ventricular size is normal. No increase in right ventricular wall thickness. Right ventricular systolic function is normal. Left Atrium: Left atrial size was mildly dilated. Right Atrium: Right atrial size was normal in size. Pericardium: There is no evidence of pericardial effusion. Mitral Valve: The mitral valve is normal in structure. Moderate mitral annular calcification. No evidence of mitral valve regurgitation. No evidence of mitral valve stenosis. The mean mitral valve gradient is 2.0 mmHg. Tricuspid Valve: The tricuspid valve is normal in structure. Tricuspid valve regurgitation is mild . No evidence of tricuspid stenosis. Aortic Valve: The aortic valve is tricuspid. There is mild calcification of the aortic valve. Aortic valve regurgitation is mild. No aortic stenosis is present. Pulmonic Valve: The pulmonic valve was not well visualized. Pulmonic valve regurgitation is not visualized. No evidence of pulmonic stenosis. Aorta: The aortic root and ascending aorta are structurally normal, with no evidence of dilitation. Venous: The inferior vena cava is normal in size with greater than 50% respiratory variability, suggesting right atrial pressure of 3 mmHg. IAS/Shunts: The atrial septum is grossly normal. Additional Comments: 3D imaging was not performed.  LEFT VENTRICLE PLAX 2D LVIDd:         4.00 cm   Diastology LVIDs:         3.20 cm   LV e' medial:    5.11 cm/s LV PW:         1.25 cm   LV E/e' medial:  21.7 LV IVS:        1.10 cm  LV e' lateral:   9.03  cm/s LVOT diam:     1.70 cm   LV E/e' lateral: 12.3 LV SV:         51 LV SV Index:   32 LVOT Area:     2.27 cm  RIGHT VENTRICLE             IVC RV Basal diam:  3.00 cm     IVC diam: 1.40 cm RV S prime:     11.60 cm/s TAPSE (M-mode): 1.5 cm LEFT ATRIUM             Index        RIGHT ATRIUM          Index LA diam:        4.50 cm 2.82 cm/m   RA Area:     7.16 cm LA Vol (A2C):   54.4 ml 34.05 ml/m  RA Volume:   13.10 ml 8.20 ml/m LA Vol (A4C):   47.2 ml 29.54 ml/m LA Biplane Vol: 54.4 ml 34.05 ml/m  AORTIC VALVE LVOT Vmax:   145.50 cm/s LVOT Vmean:  89.250 cm/s LVOT VTI:    0.224 m  AORTA Ao Root diam: 2.70 cm Ao Asc diam:  3.10 cm MITRAL VALVE                TRICUSPID VALVE MV Area (PHT): 4.15 cm     TR Peak grad:   25.0 mmHg MV Mean grad:  2.0 mmHg     TR Vmax:        250.00 cm/s MV Decel Time: 183 msec MV E velocity: 111.00 cm/s  SHUNTS                             Systemic VTI:  0.22 m                             Systemic Diam: 1.70 cm Riley Lam MD Electronically signed by Riley Lam MD Signature Date/Time: 11/25/2023/9:28:52 AM    Final    CT ABDOMEN PELVIS WO CONTRAST Result Date: 11/23/2023 CLINICAL DATA:  Metastatic breast cancer. Acute abdominal pain. Acute kidney injury. * Tracking Code: BO * EXAM: CT ABDOMEN AND PELVIS WITHOUT CONTRAST TECHNIQUE: Multidetector CT imaging of the abdomen and pelvis was performed following the standard protocol without IV contrast. RADIATION DOSE REDUCTION: This exam was performed according to the departmental dose-optimization program which includes automated exposure control, adjustment of the mA and/or kV according to patient size and/or use of iterative reconstruction technique. COMPARISON:  10/16/2023 FINDINGS: Lower chest: Right coronary artery descending thoracic aortic atheromatous vascular calcification. Trace pericardial effusion. Mitral valve calcification. Small bilateral pleural effusions with passive atelectasis. Small type 1 hiatal  hernia. Scattered small pulmonary nodules in the lung bases as on 10/16/2023, probably from metastatic disease. Hepatobiliary: Scattered hepatic hypodense lesions compatible with metastatic disease. A confluence of two lesions posteriorly in the right hepatic lobe measures 4.1 by 2.2 cm on image 8 series 2. A medial hypodense lesion posteriorly in the right hepatic lobe measures 4.4 by 3.1 cm on image 10 series 2, formerly about 4.0 by 2.7 cm. Cholecystectomy. Distal common bile duct proximally 9 mm in diameter, borderline prominent for age. Pancreas: Atrophic, otherwise unremarkable. Spleen: Unremarkable Adrenals/Urinary Tract: 2.0 by 2.0 cm right adrenal mass, internal density 34 Hounsfield units which is indeterminate. Bilateral nonobstructive renal calculi. Mild right  hydronephrosis and proximal hydroureter extending to the iliac vessel cross over, however with no distal hydroureter. Foley catheter in the urinary bladder which is mostly empty although there is a small amount of gas in the lumen of the urinary bladder. Stomach/Bowel: Small periampullary duodenal diverticulum. Large caliber distal colon and rectum containing progressive dilution of contrast medium as well as stool contents. Prior bowel resections include the right colon and at least part of the ileum. Vascular/Lymphatic: Atherosclerosis is present, including aortoiliac atherosclerotic disease. Reproductive: Uterus absent.  Adnexa unremarkable. Other: No supplemental non-categorized findings. Musculoskeletal: We partially include what appears to be a compression fracture and possible lytic lesions involving the T11 vertebral body. Suspected right eleventh rib fracture medially. Lucency eccentric to the left in the T12 vertebral body may be from metastatic disease. Bony destructive findings in the posterior elements at L4 suspicious for osseous metastatic disease. Grade 1 anterolisthesis at L5-S1. Lucency in the left iliac wing on image 53 series 4  is probably a metastatic lesion. There is also lucency further medially in the left iliac bone on image 77 series 4. IMPRESSION: 1. Mild right hydronephrosis and proximal hydroureter extending to the iliac vessel cross over, however with no distal hydroureter. This could be from a distal ureteral stricture or from a recently passed calculus. 2. Bilateral nonobstructive renal calculi. 3. Scattered hepatic hypodense lesions compatible with metastatic disease. 4. Scattered small pulmonary nodules in the lung bases as on 10/16/2023, probably from metastatic disease. 5. 2.0 by 2.0 cm right adrenal mass, internal density 34 Hounsfield units which is indeterminate but quite likely metastatic. 6. We partially include what appears to be a compression fracture and possible lytic lesions involving the T11 vertebral body. Suspected right eleventh rib fracture medially. Bony destructive findings in the posterior elements at L4 suspicious for osseous metastatic disease. Left iliac lytic lesions suspicious for metastatic disease. 7. Small bilateral pleural effusions with passive atelectasis. 8. Small type 1 hiatal hernia. 9. Aortic and coronary atherosclerosis. Aortic Atherosclerosis (ICD10-I70.0). Electronically Signed   By: Gaylyn Rong M.D.   On: 11/23/2023 17:51   US RENAL Result Date: 11/21/2023 CLINICAL DATA:  Acute kidney injury. EXAM: RENAL / URINARY TRACT ULTRASOUND COMPLETE COMPARISON:  CT 10/16/2023 FINDINGS: Right Kidney: Renal measurements: 10.1 x 4.4 x 4.7 cm = volume: 110 mL. Moderate hydronephrosis is new from prior CT. Mild increased renal parenchymal echogenicity typical of chronic renal disease. Renal calculi on prior CT not well-defined on the current exam. No evidence of focal lesion. Left Kidney: Renal measurements: 9 x 4.1 x 4.3 cm = volume: 83 mL. No hydronephrosis. Thinning of renal parenchyma with increased echogenicity. Shadowing 9 mm stone. Small renal cysts, largest 1.7 cm. No evidence of  suspicious lesion. Bladder: Appears normal for degree of bladder distention. Other: None. IMPRESSION: 1. Moderate right hydronephrosis is new from prior CT. 2. Increased renal parenchymal echogenicity typical of chronic medical renal disease. 3. Left renal stone. Known right renal calculi on prior CT not well seen. Electronically Signed   By: Narda Rutherford M.D.   On: 11/21/2023 22:06   US BIOPSY (LIVER) Result Date: 10/30/2023 INDICATION: History of prior left breast carcinoma. Imaging evidence metastatic disease in the liver, multiple skeletal sites and possibly lungs. The patient presents for liver lesion biopsy. EXAM: ULTRASOUND GUIDED CORE BIOPSY OF LIVER MEDICATIONS: None. ANESTHESIA/SEDATION: Moderate (conscious) sedation was employed during this procedure. A total of Versed 1.0 mg and Fentanyl 50 mcg was administered intravenously. Moderate Sedation Time: 15 minutes. The patient's  level of consciousness and vital signs were monitored continuously by radiology nursing throughout the procedure under my direct supervision. PROCEDURE: The procedure, risks, benefits, and alternatives were explained to the patient. Questions regarding the procedure were encouraged and answered. The patient understands and consents to the procedure. A time-out was performed prior to initiating the procedure. The abdominal wall was prepped with exiting in a sterile fashion, and a sterile drape was applied covering the operative field. A sterile gown and sterile gloves were used for the procedure. Local anesthesia was provided with 1% Lidocaine. Ultrasound was performed to localize liver lesions. A 17 gauge trocar needle was advanced into the liver at the level of a lesion within the right lobe. Three separate coaxial 18 gauge core biopsy samples were obtained and submitted in formalin. Additional ultrasound was performed after removal of the outer needle. COMPLICATIONS: None immediate. FINDINGS: Multiple rounded hypoechoic mass  lesions are seen throughout the liver parenchyma. A lesion within the medial right lobe was chosen for sampling measuring approximately 2.5 x 2.2 x 2.5 cm. Solid tissue was obtained. IMPRESSION: Ultrasound-guided core biopsy performed of a mass lesion within the right lobe of the liver measuring 2.5 cm in maximum diameter. Electronically Signed   By: Irish Lack M.D.   On: 10/30/2023 11:42  Ms. Cutshaw was interviewed and examined.  She reports continued improvement in pain.  She is drinking fluids.  Renal function continues to improve.  Ms. Dunigan indicated again today that she would like to continue treatment of the cancer if her clinical status continues to improve.  She would like to return home and plans to discuss this possibility with her daughter and sister.  Her daughter and sister both live out of town.  I discussed the case with her daughter, Jerral Ralph, by telephone.  We discussed the diagnosis of metastatic breast cancer and the prognosis.  She understands no therapy will be curative.  I estimate Ms. Molyneux to have a survival of months if she is able to continue treatment of breast cancer.  If not, her lifespan will likely be less than a few months.  She plans to discuss disposition options with her mother.  We discussed home with help from family versus nursing facility placement.  I appreciate the consult from Dr. Linna Darner  Recommendations: Continue palliative radiation to the spine Monitor renal function Increase out of bed, PT evaluation Social work evaluation to discuss disposition plans with the patient and family Outpatient follow-up will be scheduled at the Cancer center

## 2023-11-27 NOTE — Consult Note (Signed)
 Consultation Note Date: 11/27/2023   Patient Name: [name]  DOB: [birth year]  MRN: [id number]  Age / Sex: 89 y.o., female  PCP: Lorenda Ishihara, MD Referring Physician: Leeroy Bock, MD  Reason for Consultation: Establishing goals of care  HPI/Patient Profile: 89 y.o. female admitted on 11/21/2023   Clinical Assessment and Goals of Care:  89 yo lives alone, daughter Jerral Ralph is HCPOA agent, sees Dr Truett Perna, has life limiting illness of breast cancer with mets to liver, spine and peritoneum. She was admitted with severe back pain and abdominal pain, failure to thrive, dehydration, also with Acute Kidney Injury.  Palliative consult for ongoing goals of care discussions has been requested.  Palliative medicine is specialized medical care for people living with serious illness. It focuses on providing relief from the symptoms and stress of a serious illness. The goal is to improve quality of life for both the patient and the family. Goals of care: Broad aims of medical therapy in relation to the patient's values and preferences. Our aim is to provide medical care aimed at enabling patients to achieve the goals that matter most to them, given the circumstances of their particular medical situation and their constraints.   Patient awake alert, no distress, complains of pain in arm from IV K.  See below.   HCPOA  Daughter Jerral Ralph.   SUMMARY OF RECOMMENDATIONS    DNR - re discussed code status, patient confirms DNR DNI. She has DNR form from January 2025. We will void old MOST form from 2023 which has full code, full scope care listed.  Continue current mode of care Will recommend outpatient palliative care with my colleague Ms Cousar NP at [facility].  Thank you for the consult.   Code Status/Advance Care Planning: DNR   Symptom Management:     Palliative  Prophylaxis:  Frequent Pain Assessment   Psycho-social/Spiritual:  Desire for further Chaplaincy support:yes Additional Recommendations: Caregiving  Support/Resources  Prognosis:  Unable to determine  Discharge Planning: To Be Determined      Primary Diagnoses: Present on Admission:  AKI (acute kidney injury) (HCC)   I have reviewed the medical record, interviewed the patient and family, and examined the patient. The following aspects are pertinent.  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   Breast cancer (HCC) 2010   RIGHT   CAD (coronary artery disease)    Dysuria    Family history of breast cancer    GERD (gastroesophageal reflux disease)    HX OF   Heart murmur    History of blood transfusion    History of breast cancer ONCOLOGIST-- DR Truett Perna   DX 2010--  RIGHT BREAST DCIS  HIGH GRADE S/P LUMPECTOMY W/ SLN BX--  NO RECURRENCE   History of kidney stones    History of ovarian cancer    2009--  S/P COLON RESECTION FOR PAPILLARY SEROUS CARCINOMA, BORDERLINE OVARIAN CANCER  VERSUS PRIMARY PERITONEAL CARCINOMA WITH PSAMMOMA BODIES--  NO RECURRENCE   History of skin cancer    excision basal cell   Hyperlipidemia    Hypertension    Kidney stones 7/14   Nocturia    Ovarian carcinoma (HCC)    papillary serous-low grade, recurrence found in fat necrosis during ileostomy   Renal stones 2014   Shingles 30 YRS AGO   Ureteral calculi    bilateral   Social History   Socioeconomic History   Marital status: Widowed    Spouse name: Not on file   Number of children: Not on file   Years of education: Not on file   Highest education level: Not on file  Occupational History   Not on file  Tobacco Use   Smoking status: Never   Smokeless tobacco: Never  Vaping Use   Vaping status: Never Used  Substance and Sexual Activity   Alcohol use: No   Drug use: No   Sexual activity: Never     Partners: Male    Birth control/protection: Surgical    Comment: Hysterectomy  Other Topics Concern   Not on file  Social History Narrative   Not on file   Social Drivers of Health   Financial Resource Strain: Not on file  Food Insecurity: No Food Insecurity (11/21/2023)   Hunger Vital Sign    Worried About Running Out of Food in the Last Year: Never true    Ran Out of Food in the Last Year: Never true  Transportation Needs: No Transportation Needs (11/21/2023)   PRAPARE - Administrator, Civil Service (Medical): No    Lack of Transportation (Non-Medical): No  Physical Activity: Not on file  Stress: Not on file  Social Connections: Moderately Integrated (11/21/2023)   Social Connection and Isolation Panel [NHANES]    Frequency of Communication with Friends and Family: More than three times a week    Frequency of Social Gatherings with Friends and Family: More than three times a week    Attends Religious Services: More than 4 times per year    Active Member of [employer] or Organizations: Yes    Attends Banker Meetings: More than 4 times per year    Marital Status: Widowed   Family History  Problem Relation Age of Onset   Heart disease Mother    Hyperlipidemia Mother    Breast cancer Sister    Cancer Sister        breast   Scheduled Meds:  carvedilol  25 mg Oral BID WC   Chlorhexidine Gluconate Cloth  6 each Topical Daily   diltiazem  90 mg Oral Q12H   famotidine  20 mg Oral Daily   feeding supplement  237 mL Oral BID BM   polyethylene glycol  17 g Oral BID   Continuous Infusions:  potassium chloride 10 mEq (11/27/23 1030)   PRN Meds:.acetaminophen **OR** acetaminophen, albuterol, bisacodyl, HYDROmorphone (DILAUDID) injection, iohexol, lidocaine, liver oil-zinc oxide, morphine injection, nitroGLYCERIN, ondansetron **OR** ondansetron (ZOFRAN) IV, oxyCODONE, senna, traZODone Medications Prior to Admission:  Prior to Admission medications   Medication  Sig Start Date End Date Taking? Authorizing Provider  abemaciclib (VERZENIO) 100 MG tablet Take 1 tablet (100 mg total) by mouth 2 (two) times daily. 11/07/23  Yes Ladene Artist, MD  amLODipine (NORVASC) 10 MG tablet Take 1 tablet (10 mg total) by mouth daily. Patient taking differently: Take 10 mg by mouth at bedtime. 06/11/17  Yes Lyn Records, MD  aspirin EC 81 MG tablet Take 81 mg by mouth daily.   Yes [provider]  BELSOMRA 10 MG TABS Take 10 mg by mouth at bedtime. 01/20/19  Yes [provider]  Calcium Carbonate-Vitamin D (CALTRATE 600+D PO) Take 1 tablet by mouth 2 (two) times daily.   Yes [provider]  carvedilol (COREG) 25 MG tablet Take 1 tablet (25 mg total) by mouth 2 (two) times daily with a meal. 02/27/22  Yes Lyn Records, MD  chlorthalidone (HYGROTON) 25 MG tablet Take 0.5 tablets (12.5 mg total) by mouth daily. Patient taking differently: Take 25 mg by mouth daily. 02/15/21  Yes Lyn Records, MD  Cholecalciferol (VITAMIN D3) 50 MCG (2000 UT) TABS Take 2,000 Units by mouth in the morning and at bedtime.   Yes [provider]  cyanocobalamin (VITAMIN B12) 1000 MCG tablet Take 1,000 mcg by mouth daily.   Yes [provider]  dexamethasone (DECADRON) 4 MG tablet Take 1 tab (4 mg) twice a day for 3 days then take 1 tab daily Patient taking differently: Take 4 mg by mouth daily with breakfast. 10/21/23  Yes Rana Snare, NP  hydrALAZINE (APRESOLINE) 10 MG tablet Take 1 tablet (10 mg total) by mouth in the morning and at bedtime. Patient taking differently: Take 10 mg by mouth 3 (three) times daily. 05/31/21  Yes Lyn Records, MD  hydroxychloroquine (PLAQUENIL) 200 MG tablet Take 200 mg by mouth daily. 11/08/21  Yes [provider]  hydroxyurea (HYDREA) 500 MG capsule TAKE 1 CAPSULE BY MOUTH EVERY MONDAY, WEDNESDAY, AND FRIDAY. MAY TAKE WITH FOOD TO MINIMIZE GI SIDE EFFECTS 10/23/23  Yes Ladene Artist, MD  isosorbide  mononitrate (IMDUR) 60 MG 24 hr tablet Take 1 tablet (60 mg total) by mouth daily. 05/16/23  Yes Jake Bathe, MD  Multiple Vitamins-Minerals (MULTIVITAMIN WOMEN 50+) TABS Take 1 tablet by mouth daily with breakfast.   Yes [provider]  oxyCODONE (OXY IR/ROXICODONE) 5 MG immediate release tablet Take 1 tablet (5 mg total) by mouth every 4 (four) hours as needed for severe pain (pain score 7-10) or moderate pain (pain score 4-6). May take 2 tablets if pain is severe.  Do not drive while taking pain medication. 11/18/23  Yes Rana Snare, NP  potassium chloride (KLOR-CON M) 10 MEQ tablet Take 1 tablet (10 meq) twice daily for 3 days, then 1 tablet daily. Patient taking differently: Take 10 mEq by mouth daily. 11/07/23  Yes Rana Snare, NP  PROCTOZONE-HC 2.5 % rectal cream PLACE 1 APPLICATION RECTALLY 2 TIMES A DAY AS NEEDED FOR HEMORRHOIDS. Patient taking differently: Place 1 application  rectally 2 (two) times daily as needed for hemorrhoids. 01/09/15  Yes Cross, Melissa D, NP  rosuvastatin (CRESTOR) 10 MG tablet Take 10 mg by mouth every other day.   Yes [provider]  tiZANidine (ZANAFLEX) 4 MG tablet Take 4 mg by mouth 2 (two) times daily as needed for muscle spasms. 10/10/23  Yes [provider]  triamcinolone (KENALOG) 0.1 % Apply 1 application  topically as needed (for rashes- affected areas). 04/04/20  Yes [provider]   Allergies  Allergen Reactions   Ramipril Itching   Review of Systems Complains of pain and burning in her arm.   Physical Exam Awake alert No distress Regular work of breathing Appears weak No edema Abdomen not distended  Vital Signs: BP (!) 144/55   Pulse 85   Temp 97.8 F (36.6  C) (Oral)   Resp 20   Ht 5\' 6"  (1.676 m)   Wt 53.5 kg   LMP 10/07/1974   SpO2 98%   BMI 19.04 kg/m  Pain Scale: 0-10 POSS *See Group Information*: 1-Acceptable,Awake and alert Pain Score: 0-No pain   SpO2: SpO2: 98 % O2 Device:SpO2:  98 % O2 Flow Rate: .O2 Flow Rate (L/min): 2 L/min  IO: Intake/output summary:  Intake/Output Summary (Last 24 hours) at 11/27/2023 1222 Last data filed at 11/26/2023 2200 Gross per 24 hour  Intake 240 ml  Output --  Net 240 ml    LBM: Last BM Date : 11/27/23 Baseline Weight: Weight: 53.5 kg Most recent weight: Weight: 53.5 kg     Palliative Assessment/Data:   PPS 50%  Time In:  11 Time Out:  12 Time Total:  60  Greater than 50%  of this time was spent counseling and coordinating care related to the above assessment and plan.  Signed by: Rosalin Hawking, MD   Please contact Palliative Medicine Team phone at [phone] for questions and concerns.  For individual provider: See Loretha Stapler

## 2023-11-27 NOTE — Progress Notes (Signed)
   11/27/23 1100  SLP Visit Information  SLP Received On 11/27/23  General Information  HPI [name] is a 89 y.o. 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 She began palliative radiation to her spine 11/18/2023  Type of Study Bedside Swallow Evaluation  Previous Swallow Assessment none  Diet Prior to this Study Regular;Thin liquids (Level 0)  Temperature Spikes Noted No  Respiratory Status Room air  History of Recent Intubation No  Behavior/Cognition Alert;Cooperative;Pleasant mood  Oral Cavity Assessment WFL  Oral Care Completed by SLP No  Oral Cavity - Dentition Adequate natural dentition  Vision Functional for self-feeding  Self-Feeding Abilities Able to feed self  Patient Positioning Upright in bed  Baseline Vocal Quality Low vocal intensity  Volitional Cough Weak  Volitional Swallow Able to elicit  Pain Assessment  Pain Assessment No/denies pain  Oral Motor/Sensory Function  Overall Oral Motor/Sensory Function WFL  Ice Chips  Ice chips NT  Thin Liquid  Thin Liquid WFL  Presentation Cup;Self Fed;Straw  Nectar Thick Liquid  Nectar Thick Liquid NT  Honey Thick Liquid  Honey Thick Liquid NT  Puree  Puree WFL  Presentation Spoon  Solid  Solid WFL  Presentation Self Fed  SLP - End of Session  Patient left in bed;with call bell/phone within reach  SLP Assessment  Clinical Impression Statement (ACUTE ONLY) Patient presents with evidence of a suspected primary esophageal dysphagia characterized by what appears to be normal oropharyngeal swallowing function without overt s/s of aspiration but with consistent complaints of mid sternal/chest pain post swallow across consistencies and varying temperature boluses. Reviewed general safe swallowing and reflux precautions which patient will be independent  using. No further SLP services needed however recommend consideration of esophagram to evaluate esophageal motility which may be affected by medication and/or previous radiation treatment.  SLP Visit Diagnosis Dysphagia, unspecified (R13.10)  Other Related Risk Factors Deconditioning  Swallow Evaluation Recommendations  Recommended Consults Consider esophageal assessment  SLP Diet Recommendations Regular;Thin liquid (choose softer/moister solids)  Liquid Administration via Cup;Straw  Medication Administration Whole meds with liquid (can crush if needed per patient preference)  Supervision Patient able to self feed  Compensations Slow rate;Small sips/bites  Postural Changes Seated upright at 90 degrees;Remain upright for at least 30 minutes after po intake  Treatment Plan  Oral Care Recommendations Oral care BID  Treatment Recommendations No treatment recommended at this time  Follow Up Recommendations No SLP follow up  Functional Status Assessment Patient has not had a recent decline in their functional status  Individuals Consulted  Consulted and Agree with Results and Recommendations Patient  SLP Time Calculation  SLP Start Time (ACUTE ONLY) 1105  SLP Stop Time (ACUTE ONLY) 1121  SLP Time Calculation (min) (ACUTE ONLY) 16 min  SLP Evaluations  $ SLP Speech Visit 1 Visit  SLP Evaluations  $BSS Swallow 1 Procedure    Evaluation completed by Ferdinand Lango, SLP this am.  Signed on her behalf.   Rolena Infante, MS [facility] SLP Acute [employer] [phone]

## 2023-11-27 NOTE — Progress Notes (Signed)
 PROGRESS NOTE  [name]    DOB: 1934/04/21, 89 y.o.  WUJ:[id number]    Code Status: Limited: Do not attempt resuscitation (DNR) -DNR-LIMITED -Do Not Intubate/DNI    DOA: 11/21/2023   LOS: 6   Brief hospital course  [name] is a 89 y.o. 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 She began palliative radiation to her spine 11/18/2023   11/27/23 -doing well. 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  She appears to be tolerating well with the exception of feeling fatigued. -Analgesia as needed -PT/OT  Thrombocytopenia-patient had significant decrease in platelets 163> 78 today.  No signs of active bleeding. -CBC a.m. to monitor -Per oncology she is okay to continue with radiation treatments today  AKI-in the setting of CKD stage III, likely due to ATN from hypotension and dehydration. Improving significantly.  Creatinine has gradually been improving 1.78 today from 3.07 on presentation. -Continue to follow and encourage p.o. hydration   Chest pain-intermittent.  Described as pressure after eating.  Concern for dysphagia -SLP consulted -Patient has history of CAD -Cardiology was consulted, no aggressive intervention recommended   A-fib with RVR-Likely new onset.  Appears to be rate controlled at this time with intermittent breakthrough tachycardia. -Continue home Coreg 25 mg p.o. twice daily, which was initially on hold due to  hypotension -Continue Cardizem which is now increased to 90 mg twice daily. -Not a candidate for anticoagulation -Cardiology following   History of hypertension -Imdur, amlodipine on hold due to hypotension    Abnormal LFTs -Secondary to hepatic metastasis   Cancer-related pain-much improved with treatment -Continue oxycodone, IV morphine as needed   Constipation-resolved -Continue bowel regimen   Hypokalemia-improving K+ 3.0 -Replace potassium and follow BMP in am  Body mass index is 19.04 kg/m.  VTE ppx: heparin injection 5,000 Units Start: 11/21/23 1600 SCDs Start: 11/21/23 1502  Diet:     Diet   Diet regular Room service appropriate? Yes; Fluid consistency: Thin   Consultants: Oncology  radiation oncology  Subjective 11/27/23    Pt reports feeling well today.  Denies dyspnea or abdominal pain.  She feels that she has been able to tolerate food and drink better today.  Pain is manageable.   Objective   Vitals:   11/26/23 1512 11/26/23 1730 11/26/23 1929 11/27/23 0445  BP: (!) 117/56 (!) 117/56 119/64 (!) 144/55  Pulse:  (!) 101 91 85  Resp:   16 20  Temp:   97.6 F (36.4 C) 97.8 F (36.6 C)  TempSrc:   Oral Oral  SpO2:   98% 98%  Weight:      Height:        Intake/Output Summary (Last 24 hours) at 11/27/2023 0726 Last data filed at 11/26/2023 2200 Gross per 24 hour  Intake 240 ml  Output --  Net 240 ml   Filed Weights   11/21/23 1525  Weight: 53.5 kg     Physical Exam:  General: awake, alert,  NAD.  Tired appearing HEENT: atraumatic, clear conjunctiva, anicteric sclera, dry mucous membranes, hearing grossly normal Respiratory: normal respiratory effort.  CTAB Cardiovascular: quick capillary refill, normal S1/S2, RRR, no JVD, murmurs Gastrointestinal: soft, NT, ND Nervous: A&O x3. no gross focal neurologic deficits, normal speech Extremities: moves all equally, no edema, normal tone Skin: dry, intact, normal temperature, normal color. No  rashes, lesions or ulcers on exposed skin Psychiatry: normal mood, congruent affect  Labs   I have personally reviewed the following labs and imaging studies CBC    Component Value Date/Time   WBC 4.1 11/27/2023 0516   RBC 2.49 (L) 11/27/2023 0516   HGB 8.3 (L) 11/27/2023 0516   HGB 10.0 (L) 11/21/2023 0840   HGB 8.8 (L) 02/19/2008 1135   HCT 25.5 (L) 11/27/2023 0516   HCT 26.0 (L) 02/19/2008 1135   PLT 78 (L) 11/27/2023 0516   PLT 198 11/21/2023 0840   PLT 244 02/19/2008 1135   MCV 102.4 (H) 11/27/2023 0516   MCV 89.0 02/19/2008 1135   MCH 33.3 11/27/2023 0516   MCHC 32.5 11/27/2023 0516   RDW 15.4 11/27/2023 0516   RDW 22.9 (H) 02/19/2008 1135   LYMPHSABS 0.3 (L) 11/21/2023 0840   LYMPHSABS 0.3 (L) 02/19/2008 1135   MONOABS 0.1 11/21/2023 0840   MONOABS 0.9 02/19/2008 1135   EOSABS 0.0 11/21/2023 0840   EOSABS 0.1 02/19/2008 1135   BASOSABS 0.0 11/21/2023 0840   BASOSABS 0.0 02/19/2008 1135      Latest Ref Rng & Units 11/27/2023    5:16 AM 11/26/2023    5:35 AM 11/25/2023    5:20 AM  BMP  Glucose 70 - 99 mg/dL 161  096  045   BUN 8 - 23 mg/dL 69  75  81   Creatinine 0.44 - 1.00 mg/dL 4.09  8.11  9.14   Sodium 135 - 145 mmol/L 138  137  135   Potassium 3.5 - 5.1 mmol/L 3.0  3.0  2.8   Chloride 98 - 111 mmol/L 110  113  111   CO2 22 - 32 mmol/L 20  18  17    Calcium 8.9 - 10.3 mg/dL 7.9  7.8  7.7     ECHOCARDIOGRAM COMPLETE Result Date: 11/25/2023    ECHOCARDIOGRAM REPORT   Patient Name:   [name] Date of Exam: 11/25/2023 Medical Rec #:  [id number]        Height:       66.0 in Accession #:    [id number]       Weight:       117.9 lb Date of Birth:  January 22, 1934       BSA:          1.598 m Patient Age:    89 years         BP:           144/56 mmHg Patient Gender: F                HR:           88 bpm. Exam Location:  Inpatient Procedure: 2D Echo, Cardiac Doppler and Color Doppler (Both Spectral and Color            Flow Doppler were utilized during procedure).  Indications:    Chest Pain R07.9  History:        Patient has prior history of Echocardiogram examinations, most  recent 03/22/2022. CAD; Risk Factors:Hypertension.  Sonographer:    Webb Laws Referring Phys: [id number] SUNIT TOLIA IMPRESSIONS  1. Severe basal septal hypertrophy on off axis imaging; maximal septal thickness 20 mm. Maximal apical hypertrophy 20 mm. No resting gradient noted. Left ventricular ejection fraction, by estimation, is 65 to 70%. The left ventricle has normal function.  The left ventricle has no regional wall motion abnormalities. There is severe asymmetric left ventricular hypertrophy of the basal-septal segment. Left ventricular diastolic function could not be evaluated.  2. Right ventricular systolic function is normal. The right ventricular size is normal.  3. Left atrial size was mildly dilated.  4. The mitral valve is normal in structure. No evidence of mitral valve regurgitation. No evidence of mitral stenosis. The mean mitral valve gradient is 2.0 mmHg. Moderate mitral annular calcification.  5. The aortic valve is tricuspid. There is mild calcification of the aortic valve. Aortic valve regurgitation is mild. No aortic stenosis is present.  6. The inferior vena cava is normal in size with greater than 50% respiratory variability, suggesting right atrial pressure of 3 mmHg. Comparison(s): Prior images reviewed side by side. Aortic regurgitation not seen in 2023 study. Maximal basal septal thickness 14 mm on better quality long axis view. Apex not well seen in 2023 study. Conclusion(s)/Recommendation(s): Consider outpatient CMR for clarification of hypertrophy if clinically indicated. FINDINGS  Left Ventricle: Severe basal septal hypertrophy on off axis imaging; maximal septal thickness 20 mm. Maximal apical hypertrophy 20 mm. No resting gradient noted. Left ventricular ejection fraction, by estimation, is 65 to 70%. The left ventricle has normal function. The left  ventricle has no regional wall motion abnormalities. Global longitudinal strain performed but not reported based on interpreter judgement due to suboptimal tracking. The left ventricular internal cavity size was normal in size. There is severe asymmetric left ventricular hypertrophy of the basal-septal segment. Left ventricular diastolic function could not be evaluated due to mitral annular calcification (moderate or greater). Left ventricular diastolic function could not be evaluated. Right Ventricle: The right ventricular size is normal. No increase in right ventricular wall thickness. Right ventricular systolic function is normal. Left Atrium: Left atrial size was mildly dilated. Right Atrium: Right atrial size was normal in size. Pericardium: There is no evidence of pericardial effusion. Mitral Valve: The mitral valve is normal in structure. Moderate mitral annular calcification. No evidence of mitral valve regurgitation. No evidence of mitral valve stenosis. The mean mitral valve gradient is 2.0 mmHg. Tricuspid Valve: The tricuspid valve is normal in structure. Tricuspid valve regurgitation is mild . No evidence of tricuspid stenosis. Aortic Valve: The aortic valve is tricuspid. There is mild calcification of the aortic valve. Aortic valve regurgitation is mild. No aortic stenosis is present. Pulmonic Valve: The pulmonic valve was not well visualized. Pulmonic valve regurgitation is not visualized. No evidence of pulmonic stenosis. Aorta: The aortic root and ascending aorta are structurally normal, with no evidence of dilitation. Venous: The inferior vena cava is normal in size with greater than 50% respiratory variability, suggesting right atrial pressure of 3 mmHg. IAS/Shunts: The atrial septum is grossly normal. Additional Comments: 3D imaging was not performed.  LEFT VENTRICLE PLAX 2D LVIDd:         4.00 cm   Diastology LVIDs:         3.20 cm   LV e' medial:    5.11 cm/s LV PW:         1.25 cm   LV E/e'  medial:  21.7  LV IVS:        1.10 cm   LV e' lateral:   9.03 cm/s LVOT diam:     1.70 cm   LV E/e' lateral: 12.3 LV SV:         51 LV SV Index:   32 LVOT Area:     2.27 cm  RIGHT VENTRICLE             IVC RV Basal diam:  3.00 cm     IVC diam: 1.40 cm RV S prime:     11.60 cm/s TAPSE (M-mode): 1.5 cm LEFT ATRIUM             Index        RIGHT ATRIUM          Index LA diam:        4.50 cm 2.82 cm/m   RA Area:     7.16 cm LA Vol (A2C):   54.4 ml 34.05 ml/m  RA Volume:   13.10 ml 8.20 ml/m LA Vol (A4C):   47.2 ml 29.54 ml/m LA Biplane Vol: 54.4 ml 34.05 ml/m  AORTIC VALVE LVOT Vmax:   145.50 cm/s LVOT Vmean:  89.250 cm/s LVOT VTI:    0.224 m  AORTA Ao Root diam: 2.70 cm Ao Asc diam:  3.10 cm MITRAL VALVE                TRICUSPID VALVE MV Area (PHT): 4.15 cm     TR Peak grad:   25.0 mmHg MV Mean grad:  2.0 mmHg     TR Vmax:        250.00 cm/s MV Decel Time: 183 msec MV E velocity: 111.00 cm/s  SHUNTS                             Systemic VTI:  0.22 m                             Systemic Diam: 1.70 cm Riley Lam MD Electronically signed by Riley Lam MD Signature Date/Time: 11/25/2023/9:28:52 AM    Final     Disposition Plan & Communication  Patient status: Inpatient  Admitted From: Home Planned disposition location: Home Anticipated discharge date: Possibly 2/21 after radiation and stabilization of lab results and evaluation of her swallowing.  Family Communication: None at bedside.  Daughter called asking about prognosis and directed her to speak with oncology about this.   Author: Leeroy Bock, DO [facility] 11/27/2023, 7:26 AM   Available by Epic secure chat 7AM-7PM. If 7PM-7AM, please contact night-coverage.  [facility] contact information found on [email or web address].

## 2023-11-27 NOTE — Evaluation (Signed)
 Physical Therapy Evaluation Patient Details Name: [name] MRN: [id number] DOB: 08-20-1934 Today's Date: 11/27/2023  History of Present Illness  89 y.o. female admitted to the hospital on 11/21/23 with intractable pain and acute renal failure. 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 She began palliative radiation to her spine 11/18/2023  Clinical Impression  Pt admitted with above diagnosis.  Pt currently with functional limitations due to the deficits listed below (see PT Problem List). Pt will benefit from acute skilled PT to increase their independence and safety with mobility to allow discharge.  Pt assisted with ambulating short distance in hallway limited by fatigue and nausea. Pt typically independent living at home alone.  Pt states her daughter and sister can assist her upon d/c. Pt would benefit from HHPT and RW.         If plan is discharge home, recommend the following: A little help with walking and/or transfers;A little help with bathing/dressing/bathroom;Assistance with cooking/housework;Help with stairs or ramp for entrance;Assist for transportation   Can travel by private vehicle        Equipment Recommendations Rolling walker (2 wheels)  Recommendations for Other Services       Functional Status Assessment Patient has had a recent decline in their functional status and demonstrates the ability to make significant improvements in function in a reasonable and predictable amount of time.     Precautions / Restrictions Precautions Precautions: Fall      Mobility  Bed Mobility Overal bed mobility: Needs Assistance Bed Mobility: Supine to Sit, Sit to Supine     Supine to sit: Supervision, HOB elevated Sit to supine: Supervision, HOB elevated        Transfers Overall transfer level: Needs assistance Equipment used: 2 person hand held assist Transfers: Sit  to/from Stand Sit to Stand: Min assist           General transfer comment: pt declined using RW, reliant on Bil UE assist/support, light assist to rise    Ambulation/Gait Ambulation/Gait assistance: Contact guard assist Gait Distance (Feet): 60 Feet Assistive device: 2 person hand held assist Gait Pattern/deviations: Step-through pattern, Decreased stride length, Trunk flexed Gait velocity: decr     General Gait Details: declined use of RW today, reports nausea and fatigue limiting distance  Stairs            Wheelchair Mobility     Tilt Bed    Modified Rankin (Stroke Patients Only)       Balance Overall balance assessment: Mild deficits observed, not formally tested                                           Pertinent Vitals/Pain Pain Assessment Pain Assessment: No/denies pain    Home Living Family/patient expects to be discharged to:: Private residence Living Arrangements: Alone Available Help at Discharge: Available PRN/intermittently;Family (daughter and sister) Type of Home: House       Alternate Level Stairs-Number of Steps: 14 Home Layout: Two level Home Equipment: None      Prior Function Prior Level of Function : Driving;Independent/Modified Independent                     Extremity/Trunk Assessment        Lower Extremity Assessment Lower Extremity Assessment: Generalized weakness    Cervical /  Trunk Assessment Cervical / Trunk Assessment: Kyphotic  Communication   Communication Communication: No apparent difficulties    Cognition Arousal: Alert Behavior During Therapy: WFL for tasks assessed/performed, Flat affect   PT - Cognitive impairments: No apparent impairments                                 Cueing       General Comments      Exercises     Assessment/Plan    PT Assessment Patient needs continued PT services  PT Problem List Decreased mobility;Decreased activity  tolerance;Decreased strength;Decreased balance;Decreased knowledge of use of DME       PT Treatment Interventions DME instruction;Gait training;Balance training;Functional mobility training;Therapeutic exercise;Therapeutic activities;Patient/family education;Stair training    PT Goals (Current goals can be found in the Care Plan section)  Acute Rehab PT Goals PT Goal Formulation: With patient Time For Goal Achievement: 12/11/23 Potential to Achieve Goals: Good    Frequency Min 1X/week     Co-evaluation               AM-PAC PT "6 Clicks" Mobility  Outcome Measure Help needed turning from your back to your side while in a flat bed without using bedrails?: A Little Help needed moving from lying on your back to sitting on the side of a flat bed without using bedrails?: A Little Help needed moving to and from a bed to a chair (including a wheelchair)?: A Little Help needed standing up from a chair using your arms (e.g., wheelchair or bedside chair)?: A Little Help needed to walk in hospital room?: A Little Help needed climbing 3-5 steps with a railing? : A Lot 6 Click Score: 17    End of Session Equipment Utilized During Treatment: Gait belt Activity Tolerance: Patient tolerated treatment well Patient left: in bed;with call bell/phone within reach Nurse Communication: Mobility status PT Visit Diagnosis: Difficulty in walking, not elsewhere classified (R26.2);Adult, failure to thrive (R62.7)    Time: 7829-5621 PT Time Calculation (min) (ACUTE ONLY): 13 min   Charges:   PT Evaluation $PT Eval Low Complexity: 1 Low   PT General Charges $$ ACUTE PT VISIT: 1 Visit       Thomasene Mohair PT, DPT Physical Therapist Acute Rehabilitation Services Office: [phone]   Kati L Payson 11/27/2023, 12:00 PM

## 2023-11-27 NOTE — Progress Notes (Addendum)
 Progress Note  Patient Name: [name] Date of Encounter: 11/27/2023  Primary Cardiologist: Donato Schultz, MD  Subjective   Feeling much better today, no chest pain. HR predominantly 90s, occasional spikes to 120s.  Inpatient Medications    Scheduled Meds:  carvedilol  25 mg Oral BID WC   Chlorhexidine Gluconate Cloth  6 each Topical Daily   diltiazem  60 mg Oral Q12H   famotidine  20 mg Oral Daily   feeding supplement  237 mL Oral BID BM   isosorbide mononitrate  60 mg Oral Daily   polyethylene glycol  17 g Oral BID   Continuous Infusions:  potassium chloride 10 mEq (11/27/23 0938)   PRN Meds: acetaminophen **OR** acetaminophen, albuterol, bisacodyl, HYDROmorphone (DILAUDID) injection, iohexol, lidocaine, liver oil-zinc oxide, morphine injection, nitroGLYCERIN, ondansetron **OR** ondansetron (ZOFRAN) IV, oxyCODONE, senna, traZODone   Vital Signs    Vitals:   11/26/23 1730 11/26/23 1929 11/27/23 0445 11/27/23 0933  BP: (!) 117/56 119/64 (!) 144/55 (!) 144/55  Pulse: (!) 101 91 85 85  Resp:  16 20   Temp:  97.6 F (36.4 C) 97.8 F (36.6 C)   TempSrc:  Oral Oral   SpO2:  98% 98%   Weight:      Height:        Intake/Output Summary (Last 24 hours) at 11/27/2023 1022 Last data filed at 11/26/2023 2200 Gross per 24 hour  Intake 240 ml  Output --  Net 240 ml      11/21/2023    3:25 PM 11/21/2023    8:49 AM 11/11/2023    9:55 AM  Last 3 Weights  Weight (lbs) 117 lb 15.1 oz 113 lb 106 lb 8 oz  Weight (kg) 53.5 kg 51.256 kg 48.308 kg     Telemetry    Coarse AF/ALF 90s-120s - Personally Reviewed  Physical Exam   General: Frail elderly WF in no acute distress. Head: Normocephalic, atraumatic, sclera non-icteric, no xanthomas, nares are without discharge. Neck: Negative for carotid bruits. JVP not elevated. Lungs: Clear bilaterally to auscultation without wheezes, rales, or rhonchi. Breathing is unlabored. Heart: Irregularly irregular, tachycardic, 2/6 SEM  without rubs or gallops.  Abdomen: Soft, non-tender, non-distended with normoactive bowel sounds. No rebound/guarding. Extremities: No clubbing or cyanosis. No edema. Distal pedal pulses are 2+ and equal bilaterally. Neuro: Alert and oriented X 3. Moves all extremities spontaneously. Psych: Low volume of voice. Responds to questions appropriately with a normal affect.    Labs    High Sensitivity Troponin:   Recent Labs  Lab 11/24/23 0711 11/24/23 1028  TROPONINIHS 54* 50*      Cardiac EnzymesNo results for input(s): "TROPONINI" in the last 168 hours. No results for input(s): "TROPIPOC" in the last 168 hours.   Chemistry Recent Labs  Lab 11/23/23 0648 11/24/23 0711 11/25/23 0520 11/26/23 0535 11/27/23 0516  NA 139 139 135 137 138  K 3.1* 2.9* 2.8* 3.0* 3.0*  CL 111 114* 111 113* 110  CO2 17* 15* 17* 18* 20*  GLUCOSE 74 116* 169* 124* 136*  BUN 102* 98* 81* 75* 69*  CREATININE 2.37* 2.15* 1.87* 1.84* 1.78*  CALCIUM 7.7* 8.0* 7.7* 7.8* 7.9*  PROT 4.1* 4.2*  --   --  4.0*  ALBUMIN 2.2* 2.3*  --   --  2.3*  AST 88* 37  --   --  16  ALT 568* 382*  --   --  131*  ALKPHOS 122 111  --   --  76  BILITOT 1.5* 0.9  --   --  0.8  GFRNONAA 19* 22* 26* 26* 27*  ANIONGAP 11 10 7 6 8      Hematology Recent Labs  Lab 11/23/23 0648 11/24/23 0711 11/27/23 0516  WBC 5.2 5.2 4.1  RBC 2.95* 2.97* 2.49*  HGB 9.8* 9.7* 8.3*  HCT 29.9* 30.1* 25.5*  MCV 101.4* 101.3* 102.4*  MCH 33.2 32.7 33.3  MCHC 32.8 32.2 32.5  RDW 15.4 15.4 15.4  PLT 154 163 78*    BNPNo results for input(s): "BNP", "PROBNP" in the last 168 hours.   DDimer No results for input(s): "DDIMER" in the last 168 hours.   Radiology    No results found.  Cardiac Studies   2d echo 11/25/23   1. Severe basal septal hypertrophy on off axis imaging; maximal septal  thickness 20 mm. Maximal apical hypertrophy 20 mm. No resting gradient  noted. Left ventricular ejection fraction, by estimation, is 65 to 70%.   The left ventricle has normal function.   The left ventricle has no regional wall motion abnormalities. There is  severe asymmetric left ventricular hypertrophy of the basal-septal  segment. Left ventricular diastolic function could not be evaluated.   2. Right ventricular systolic function is normal. The right ventricular  size is normal.   3. Left atrial size was mildly dilated.   4. The mitral valve is normal in structure. No evidence of mitral valve  regurgitation. No evidence of mitral stenosis. The mean mitral valve  gradient is 2.0 mmHg. Moderate mitral annular calcification.   5. The aortic valve is tricuspid. There is mild calcification of the  aortic valve. Aortic valve regurgitation is mild. No aortic stenosis is  present.   6. The inferior vena cava is normal in size with greater than 50%  respiratory variability, suggesting right atrial pressure of 3 mmHg.   Comparison(s): Prior images reviewed side by side. Aortic regurgitation  not seen in 2023 study. Maximal basal septal thickness 14 mm on better  quality long axis view. Apex not well seen in 2023 study.   Conclusion(s)/Recommendation(s): Consider outpatient CMR for clarification  of hypertrophy if clinically indicated.   Patient Profile     89 y.o. female with breast cancer with metastasis to liver, peritoneum, and spine, prior ovarian cancer, moderate CAD with chronic episodic chest tightness, HTN, diastolic dysfunction (grade 3 by echo 2023)/probable chronic HFpEF by chart, normal renal artery duplex 2017, HLD, mildly elevated PASP/mild MR by echo 2023, GERD, kidney stones, carotid artery disease (1-39% BICA 03/2022 with >50% L CCA), PACs/atrial bigeminy, essential thrombocytosis. Undergoing systemic therapy and palliative radiation for her cancer. She was admitted from onc clinic 2/14 with continued back pain, constipation, anterior chest discomfort when swallowing, limited oral intake, oral ulcers, abdominal pain and  constipation. She was felt to be dehydrated. Clinic BP 98/48. She was admitted to the hospital for AKI with peak Cr 3.07, also found to have hypokalemia, hypocalcemia (corrects to normal), macrocytic anemia with Hgb 9's (previously 12-13's), elevated LFTs.  Cardiology consulted 2/17 for rapid AF vs AFL with RVR with associated chest pain.  Assessment & Plan    1. New onset coarse atrial fibrillation vs atrial flutter RVR - occurring in the context of admission for AKI, dehydration, hypokalemia in the context of cancer-related pain with widespread metastases and holding of home antihypertensives - treated with continuation of home carvedilol + IV diltiazem, transitioned to short acting oral diltiazem 60mg  BID (short acting), HR currently 90s with occasional spikes  to 120s -> I will change to SR formulation today to see if we can get a little better coverage, can consolidate to 24-hour dosing once HR control achieved. Await MD input on further increasing dose as well. Addendum: Dr. Odis Hollingshead recommends increasing to 90mg  BID, notified nurse and updated MAR. - see Dr. Emelda Brothers notes regarding anticoagulation - patient did not wish to be on blood thinner, family felt risk outweighed benefit - if she wishes to be on a blood thinner in the future would need GI/hematology evaluation to rule reversible causes of anemia. Platelets also noted to be falling. - thyroid OK  - recommend primary team aim for normalization of potassium level   2. Chest pain, elevated troponin, known moderate CAD 2007 with history of suspected microvascular angina as well, suspect driven by RVR superimposed on hypertrophic cardiomyopathy - hsTroponin 54->50 c/w demand ischemia, not ACS - updated echo 11/25/23 showing severe basal septal hypertrophy with no resting gradient, EF 65-70%, mild AI -> will review with MD but suspect deferral of cMRI in lieu of symptom based management is most appropriate; this also helps to explain her poor  tolerance of rapid afib - will hold Imdur pending MD review of HCM seen on echo - given clinical picture, anticipate focus will be on symptom management rather than aggressive procedural interventions given risk of worsening AKI, anemia, oncologic issues - no statin with abnormal LFTs - hold off ASA given worsening anemia/thrombocytopenia this admission   Remainder per primary - hypokalemia - AKI - hypoalbuminemia - macrocytic anemia - malignancy - thrombocytopenia  For questions or updates, please contact [facility] Please consult [email or web address] for contact info under Cardiology/STEMI.  Signed, Laurann Montana, PA-C 11/27/2023, 10:22 AM   ADDENDUM:   Patient seen and examined.  I personally taken a history, examined the patient, reviewed relevant notes,  laboratory data / imaging studies.  I performed a substantive portion of this encounter and formulated the important aspects of the plan.  I agree with the APP's note, impression, and recommendations; however, I have edited the note to reflect changes or salient points.   No events overnight.  PHYSICAL EXAM: Today's Vitals   11/27/23 0445 11/27/23 0933 11/27/23 1224 11/27/23 1300  BP: (!) 144/55 (!) 144/55 (!) 135/54 (!) 135/54  Pulse: 85 85 87   Resp: 20  18   Temp: 97.8 F (36.6 C)  97.8 F (36.6 C)   TempSrc: Oral  Oral   SpO2: 98%  99%   Weight:      Height:      PainSc:  0-No pain     Body mass index is 19.04 kg/m.   Net IO Since Admission: 3,872.22 mL [11/27/23 1442]  Filed Weights   11/21/23 1525  Weight: 53.5 kg    Physical Exam  Constitutional: No distress. She appears chronically ill.  hemodynamically stable  Neck: No JVD present.  Cardiovascular: Normal rate, S1 normal and S2 normal. An irregularly irregular rhythm present. Exam reveals no gallop, no S3 and no S4.  No murmur heard. Pulmonary/Chest: Effort normal and breath sounds normal. No stridor. She has no wheezes. She has no rales.   Musculoskeletal:        General: No edema.     Cervical back: Neck supple.  Skin: Skin is warm.    EKG: (personally reviewed by me) No new EKGs.  Telemetry: (personally reviewed by me) Atrial fibrillation with episodes of controlled and rapid ventricular rates   Impression and recommendations: Atrial fibrillation  Now predominately with controlled ventricular rates but continues to have episodes of RVR I have spoke to both the patient and family in great detail with regards to rate control, rhythm control, and thromboembolic prophylaxis for stroke prevention.  Given the patient's advanced age, frailty, and complex past medical history, underlying anemia, compression fractures, and risk of falls they would like to proceed with rate control strategy for now. Currently on carvedilol 25 mg p.o. twice daily. Will increase Cardizem to 90 mg p.o. twice daily with holding parameters. Please refer to the note from 11/26/2023 with regards to anticoagulation discussion.  Bicytopenia: Hemoglobin and platelets trending down. Defer to primary team and hematology oncology.  Hypokalemia: Replacement per primary team Failure to thrive/dehydration: Admitted for dehydration, constipation  Acute kidney injury: Improving.  Avoid nephrotoxic agents.  Breast cancer with metastasis to liver, spine, and peritoneum: Follows with medical oncology  Cardiology will follow peripherally for rate control strategy.  Please transition to long-acting Cardizem closer to discharge.  Will arrange outpatient follow-up with Dr. Donato Schultz or his care team.  Please reach out in the meantime if any questions or concerns arise.  Plan of care as discussed with both the patient and her daughter Eber Jones over the phone yesterday 11/26/2023.   This note was created using a voice recognition software as a result there may be grammatical errors inadvertently enclosed that do not reflect the nature of this encounter. Every attempt  is made to correct such errors.   Tessa Lerner, DO, [facility]  [facility]  [address] [place], [place] [address] Pager: [phone] Office: [phone] 11/27/2023 2:42 PM

## 2023-11-27 NOTE — TOC Progression Note (Signed)
 Transition of Care [facility]) - Progression Note    Patient Details  Name: [name] MRN: [id number] Date of Birth: 1934/03/26  Transition [facility]) CM/SW Contact  Gwenn Teodoro, Olegario Messier, RN Phone Number: 11/27/2023, 3:31 PM  Clinical Narrative:  Just spoke to dtr Carolyn [phone] about HHC,rw as recc. I explained that I will await further guidance with d/c plans.She says she has only spoke to Dr. Truett Perna. I see notes for otpt palliative. Will continue to monitor.     Expected Discharge Plan: Home w Home Health Services Barriers to Discharge: Continued Medical Work up  Expected Discharge Plan and Services   Discharge Planning Services: CM Consult Post Acute Care Choice: Home Health Living arrangements for the past 2 months: Single Family Home                                       Social Determinants of Health (SDOH) Interventions SDOH Screenings   Food Insecurity: No Food Insecurity (11/21/2023)  Housing: Low Risk  (11/21/2023)  Transportation Needs: No Transportation Needs (11/21/2023)  Utilities: Not At Risk (11/21/2023)  Depression (PHQ2-9): Low Risk  (11/11/2023)  Social Connections: Moderately Integrated (11/21/2023)  Tobacco Use: Low Risk  (11/21/2023)    Readmission Risk Interventions    11/23/2023   11:56 AM  Readmission Risk Prevention Plan  Transportation Screening Complete  PCP or Specialist Appt within 5-7 Days Complete  Home Care Screening Complete  Medication Review (RN CM) Complete

## 2023-11-28 ENCOUNTER — Other Ambulatory Visit: Payer: Self-pay

## 2023-11-28 ENCOUNTER — Ambulatory Visit
Admission: RE | Admit: 2023-11-28 | Discharge: 2023-11-28 | Disposition: A | Discharge status: Home / Self Care | Payer: Medicare Other | Source: Ambulatory Visit | Attending: Radiation Oncology | Admitting: Radiation Oncology

## 2023-11-28 DIAGNOSIS — Z170 Estrogen receptor positive status [ER+]: Secondary | ICD-10-CM | POA: Diagnosis not present

## 2023-11-28 DIAGNOSIS — N179 Acute kidney failure, unspecified: Secondary | ICD-10-CM | POA: Diagnosis not present

## 2023-11-28 DIAGNOSIS — C50919 Malignant neoplasm of unspecified site of unspecified female breast: Secondary | ICD-10-CM | POA: Diagnosis not present

## 2023-11-28 DIAGNOSIS — C50412 Malignant neoplasm of upper-outer quadrant of left female breast: Secondary | ICD-10-CM | POA: Diagnosis not present

## 2023-11-28 DIAGNOSIS — C7951 Secondary malignant neoplasm of bone: Secondary | ICD-10-CM | POA: Diagnosis not present

## 2023-11-28 DIAGNOSIS — Z510 Encounter for antineoplastic radiation therapy: Secondary | ICD-10-CM | POA: Diagnosis not present

## 2023-11-28 LAB — BASIC METABOLIC PANEL
Anion gap: 8 (ref 5–15)
BUN: 53 mg/dL — ABNORMAL HIGH (ref 8–23)
CO2: 18 mmol/L — ABNORMAL LOW (ref 22–32)
Calcium: 7.9 mg/dL — ABNORMAL LOW (ref 8.9–10.3)
Chloride: 112 mmol/L — ABNORMAL HIGH (ref 98–111)
Creatinine, Ser: 1.57 mg/dL — ABNORMAL HIGH (ref 0.44–1.00)
GFR, Estimated: 32 mL/min — ABNORMAL LOW (ref >60)
Glucose, Bld: 128 mg/dL — ABNORMAL HIGH (ref 70–99)
Potassium: 3.5 mmol/L (ref 3.5–5.1)
Sodium: 138 mmol/L (ref 135–145)

## 2023-11-28 LAB — RAD ONC ARIA SESSION SUMMARY
Course Elapsed Days: 10
Plan Fractions Treated to Date: 7
Plan Prescribed Dose Per Fraction: 3 Gy
Plan Total Fractions Prescribed: 10
Plan Total Prescribed Dose: 30 Gy
Reference Point Dosage Given to Date: 21 Gy
Reference Point Session Dosage Given: 3 Gy
Session Number: 7

## 2023-11-28 LAB — CBC
HCT: 25.7 % — ABNORMAL LOW (ref 36.0–46.0)
Hemoglobin: 8.3 g/dL — ABNORMAL LOW (ref 12.0–15.0)
MCH: 33.1 pg (ref 26.0–34.0)
MCHC: 32.3 g/dL (ref 30.0–36.0)
MCV: 102.4 fL — ABNORMAL HIGH (ref 80.0–100.0)
Platelets: 73 10*3/uL — ABNORMAL LOW (ref 150–400)
RBC: 2.51 MIL/uL — ABNORMAL LOW (ref 3.87–5.11)
RDW: 15.7 % — ABNORMAL HIGH (ref 11.5–15.5)
WBC: 4.1 10*3/uL (ref 4.0–10.5)
nRBC: 0 % (ref 0.0–0.2)

## 2023-11-28 NOTE — TOC Progression Note (Signed)
 Transition of Care [facility]) - Progression Note    Patient Details  Name: [name] MRN: [id number] Date of Birth: May 19, 1934  Transition of Care [facility]) CM/SW Contact  Nela Bascom, Olegario Messier, RN Phone Number: 11/28/2023, 4:54 PM  Clinical Narrative: Patient/dtr in agreement to ST SNF. Will need PT to state recc. MD aware.      Expected Discharge Plan: Skilled Nursing Facility Barriers to Discharge: Continued Medical Work up  Expected Discharge Plan and Services   Discharge Planning Services: CM Consult Post Acute Care Choice: Home Health Living arrangements for the past 2 months: Single Family Home                                       Social Determinants of Health (SDOH) Interventions SDOH Screenings   Food Insecurity: No Food Insecurity (11/21/2023)  Housing: Low Risk  (11/21/2023)  Transportation Needs: No Transportation Needs (11/21/2023)  Utilities: Not At Risk (11/21/2023)  Depression (PHQ2-9): Low Risk  (11/11/2023)  Social Connections: Moderately Integrated (11/21/2023)  Tobacco Use: Low Risk  (11/21/2023)    Readmission Risk Interventions    11/23/2023   11:56 AM  Readmission Risk Prevention Plan  Transportation Screening Complete  PCP or Specialist Appt within 5-7 Days Complete  Home Care Screening Complete  Medication Review (RN CM) Complete

## 2023-11-28 NOTE — Progress Notes (Signed)
 Tentatively arranged cards f/u 3/18 with me, placed on AVS.

## 2023-11-28 NOTE — Evaluation (Signed)
 Occupational Therapy Evaluation Patient Details Name: [name] MRN: [id number] DOB: [birth year] Today's Date: 11/28/2023   History of Present Illness    89 y.o. female admitted to the hospital on 11/21/23 with intractable pain and acute renal failure. 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 She began palliative radiation to her spine 11/18/2023      Clinical Impressions Patient is currently requiring as high as minimal assistance with basic ADLs, as well as CGA assist with functional transfers to [facility] using stand pivot with use of RW while demonstrating impaired eccentric control with transitions. .   Current level of function is below patient's typical baseline.    During this evaluation, patient was limited by back pain, generalized weakness, impaired activity tolerance, and flat somewhat depressed looking presentation, all of which has the potential to impact patient's and/or caregivers' safety and independence during functional mobility, as well as performance for ADLs.    Patient lives alone with a daughter and sister who are able to provide limited supervision and assistance due to living ~30-45 minutes away from pt.  Patient demonstrates fair rehab potential, and should benefit from continued skilled occupational therapy services while in acute care to maximize safety, independence and quality of life at home.  Continued occupational therapy services are recommended.  ?      If plan is discharge home, recommend the following:   A little help with bathing/dressing/bathroom;Assistance with cooking/housework;A little help with walking and/or transfers     Functional Status Assessment   Patient has had a recent decline in their functional status and demonstrates the ability to make significant improvements in function in a reasonable and predictable amount of time.     Equipment  Recommendations   BSC/3in1     Recommendations for Other Services         Precautions/Restrictions         Mobility Bed Mobility               General bed mobility comments: Pt received in recliner.    Transfers                          Balance Overall balance assessment: Mild deficits observed, not formally tested                                         ADL either performed or assessed with clinical judgement   ADL Overall ADL's : Needs assistance/impaired   Eating/Feeding Details (indicate cue type and reason): Ate bacon only and left rest of breakfast untouched. Pt described appetite as "fair". Grooming: Wash/dry hands;Sitting;Set up Grooming Details (indicate cue type and reason): Did not have energy to stand at sink for hands post toileting. Upper Body Bathing: Sitting;Modified independent   Lower Body Bathing: Supervison/ safety;Sitting/lateral leans;Contact guard assist   Upper Body Dressing : Set up;Sitting   Lower Body Dressing: Contact guard assist;Minimal assistance;Sitting/lateral leans;Sit to/from stand Lower Body Dressing Details (indicate cue type and reason): Able to figure 4 to doff/don socks but tiring. Toilet Transfer: BSC/3in1;Rolling walker (2 wheels);Contact guard assist;Cueing for safety;Stand-pivot Toilet Transfer Details (indicate cue type and reason): Pt stood from recliner with CGA and used RW to pivot to [facility] with CGA. Min As to safely descend as pt did not follow safety cues for  hands and pt with decreased eccentric control. Toileting- Clothing Manipulation and Hygiene: Contact guard assist;Set up;Sitting/lateral lean       Functional mobility during ADLs: Contact guard assist;Rolling walker (2 wheels)       Vision Baseline Vision/History: 0 No visual deficits Vision Assessment?: No apparent visual deficits     Perception         Praxis         Pertinent Vitals/Pain Pain Assessment Pain  Assessment: 0-10 Pain Score: 6  Pain Location: back Pain Intervention(s): Limited activity within patient's tolerance, Monitored during session (Pt declined for nurse to be notified for pain meds.)     Extremity/Trunk Assessment Upper Extremity Assessment Upper Extremity Assessment: Generalized weakness;Right hand dominant   Lower Extremity Assessment Lower Extremity Assessment: Generalized weakness   Cervical / Trunk Assessment Cervical / Trunk Assessment: Kyphotic   Communication Communication Communication: No apparent difficulties   Cognition Arousal: Alert Behavior During Therapy: WFL for tasks assessed/performed, Flat affect               OT - Cognition Comments: Ox4.                 Following commands: Intact       Cueing  General Comments          Exercises     Shoulder Instructions      Home Living Family/patient expects to be discharged to:: Private residence Living Arrangements: Alone Available Help at Discharge: Available PRN/intermittently;Family (Daughter lives in [place] and Sister in [place]. Pt lives in [place].) Type of Home: House       Home Layout: Two level;1/2 bath on main level Alternate Level Stairs-Number of Steps: 14 with chair lift. Alternate Level Stairs-Rails: Right Bathroom Shower/Tub: Producer, television/film/video:  (comfort height) Bathroom Accessibility: Yes How Accessible: Accessible via walker ("I think so for a walker. But not a WC") Home Equipment: Grab bars - tub/shower   Additional Comments: Chair Lift to second floor with full bathroom and bedroom up. Thinks daughter may have a shower chair from pt's late husband.      Prior Functioning/Environment Prior Level of Function : Driving;Independent/Modified Independent               ADLs Comments: Housekeeper 1x/month. Does not have to do yard work.    OT Problem List: Decreased strength;Decreased range of motion;Decreased safety  awareness;Decreased activity tolerance;Decreased knowledge of use of DME or AE;Impaired balance (sitting and/or standing);Pain   OT Treatment/Interventions: Self-care/ADL training;Therapeutic activities;Therapeutic exercise;Patient/family education;Energy conservation;DME and/or AE instruction;Balance training      OT Goals(Current goals can be found in the care plan section)   Acute Rehab OT Goals Patient Stated Goal: Gain strength OT Goal Formulation: With patient Time For Goal Achievement: 12/12/23 Potential to Achieve Goals: Fair ADL Goals Pt Will Perform Grooming: with modified independence;standing (Tolerate at least 1 standing grooming task.) Pt Will Perform Lower Body Dressing: with adaptive equipment;with modified independence;sitting/lateral leans;sit to/from stand Pt Will Transfer to Toilet: ambulating;with modified independence Pt Will Perform Toileting - Clothing Manipulation and hygiene: with modified independence;with adaptive equipment;sitting/lateral leans Pt/caregiver will Perform Home Exercise Program: Increased ROM;Increased strength;Both right and left upper extremity;Independently (GENTLY strengthen. Work on flexibility and movement over mm mass.) Additional ADL Goal #1: Patient will identify at least 3 energy conservation strategies to employ at home in order to maximize function and quality of life and decrease caregiver burden while preventing exacerbation of symptoms and rehospitalization. Additional ADL Goal #  2: Pt will complete any two full ADLs while integrating energy conservation techniques with RPE no greater than 5/10 and VSS.   OT Frequency:  Min 1X/week    Co-evaluation              AM-PAC OT "6 Clicks" Daily Activity     Outcome Measure Help from another person eating meals?: None Help from another person taking care of personal grooming?: A Little Help from another person toileting, which includes using toliet, bedpan, or urinal?: A  Little Help from another person bathing (including washing, rinsing, drying)?: A Little Help from another person to put on and taking off regular upper body clothing?: A Little Help from another person to put on and taking off regular lower body clothing?: A Little 6 Click Score: 19   End of Session Equipment Utilized During Treatment: Rolling walker (2 wheels);Gait belt Nurse Communication: Other (comment) (RN in room and coordinated care.)  Activity Tolerance: Patient tolerated treatment well Patient left:    OT Visit Diagnosis: Unsteadiness on feet (R26.81);Pain;Muscle weakness (generalized) (M62.81) Pain - part of body:  (back)                Time: 2130-8657 OT Time Calculation (min): 43 min Charges:  OT General Charges $OT Visit: 1 Visit OT Evaluation $OT Eval Low Complexity: 1 Low OT Treatments $Self Care/Home Management : 23-37 mins  Victorino Dike, OT Acute Rehab Services Office: [phone] 11/28/2023   Theodoro Clock 11/28/2023, 9:36 AM

## 2023-11-28 NOTE — Progress Notes (Signed)
 Physical Therapy Treatment Patient Details Name: [name] MRN: [id number] DOB: 08-22-1934 Today's Date: 11/28/2023   History of Present Illness 89 y.o. female admitted to the hospital on 11/21/23 with intractable pain and acute renal failure. 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 She began palliative radiation to her spine 11/18/2023    PT Comments  Pt was not feeling well, but agreed to be helped to the [facility] and then decided to walk. Pt does have severe kyphosis and back pain limiting her mobility. Pt demonstrated decreased balance with gait requiring therapist to assist. Pt ambulated 65 ft with min hand held assist. Pt will continue to benefit from acute skilled PT as long as she is agreeable and she feels well enough to participate.    If plan is discharge home, recommend the following: A little help with walking and/or transfers;A little help with bathing/dressing/bathroom;Assistance with cooking/housework;Help with stairs or ramp for entrance;Assist for transportation   Can travel by private vehicle        Equipment Recommendations  Rolling walker (2 wheels)    Recommendations for Other Services       Precautions / Restrictions Precautions Precautions: Fall Restrictions Weight Bearing Restrictions Per Provider Order: No     Mobility  Bed Mobility Overal bed mobility: Modified Independent Bed Mobility: Supine to Sit     Supine to sit: Supervision, HOB elevated          Transfers Overall transfer level: Needs assistance Equipment used: 1 person hand held assist Transfers: Sit to/from Stand, Bed to chair/wheelchair/BSC Sit to Stand: Min assist   Step pivot transfers: Min assist            Ambulation/Gait Ambulation/Gait assistance: Min assist Gait Distance (Feet): 65 Feet Assistive device: 1 person hand held assist Gait Pattern/deviations: Step-through  pattern, Decreased stride length, Trunk flexed Gait velocity: decr     General Gait Details: pt not feeling great but decided after using the [facility] she wanted to walk some. Pt declined using the RW. Pt did have some decreased balance requiring therapist to assist.   Stairs             Wheelchair Mobility     Tilt Bed    Modified Rankin (Stroke Patients Only)       Balance Overall balance assessment: Needs assistance Sitting-balance support: No upper extremity supported Sitting balance-Leahy Scale: Fair     Standing balance support: During functional activity Standing balance-Leahy Scale: Poor                              Communication Communication Communication: No apparent difficulties  Cognition Arousal: Alert Behavior During Therapy: WFL for tasks assessed/performed, Flat affect   PT - Cognitive impairments: No apparent impairments                         Following commands: Intact      Cueing    Exercises      General Comments General comments (skin integrity, edema, etc.): Pt didnt feel well today. Pt declined therapy, but then asked to be helped to the [facility]. Pt then decided she wanted to walk a little.      Pertinent Vitals/Pain Pain Assessment Pain Score: 6  Pain Location: back Pain Descriptors / Indicators: Discomfort Pain Intervention(s): Limited activity within patient's tolerance, Monitored during session  Home Living                          Prior Function            PT Goals (current goals can now be found in the care plan section) Progress towards PT goals: Progressing toward goals    Frequency    Min 1X/week      PT Plan      Co-evaluation              AM-PAC PT "6 Clicks" Mobility   Outcome Measure  Help needed turning from your back to your side while in a flat bed without using bedrails?: A Little Help needed moving from lying on your back to sitting on the side of a flat  bed without using bedrails?: A Little Help needed moving to and from a bed to a chair (including a wheelchair)?: A Little Help needed standing up from a chair using your arms (e.g., wheelchair or bedside chair)?: A Little Help needed to walk in hospital room?: A Little Help needed climbing 3-5 steps with a railing? : A Lot 6 Click Score: 17    End of Session Equipment Utilized During Treatment: Gait belt Activity Tolerance: Patient limited by fatigue Patient left: in chair;with call bell/phone within reach Nurse Communication: Mobility status PT Visit Diagnosis: Difficulty in walking, not elsewhere classified (R26.2);Adult, failure to thrive (R62.7)     Time: 4098-1191 PT Time Calculation (min) (ACUTE ONLY): 22 min  Charges:    $Gait Training: 8-22 mins $Therapeutic Activity: 8-22 mins PT General Charges $$ ACUTE PT VISIT: 1 Visit                      Greggory Stallion 11/28/2023, 1:27 PM

## 2023-11-28 NOTE — Progress Notes (Addendum)
 Daily Progress Note   Patient Name: [name]       Date: 11/28/2023 DOB: 1934-01-05  Age: 89 y.o. MRN#: [id number] Attending Physician: Leeroy Bock, MD Primary Care Physician: Lorenda Ishihara, MD Admit Date: 11/21/2023  Reason for Consultation/Follow-up: Establishing goals of care  Subjective: No distress  Length of Stay: 7  Current Medications: Scheduled Meds:   carvedilol  25 mg Oral BID WC   Chlorhexidine Gluconate Cloth  6 each Topical Daily   diltiazem  90 mg Oral Q12H   famotidine  20 mg Oral Daily   feeding supplement  237 mL Oral BID BM   magic mouthwash w/lidocaine  5 mL Oral TID    Continuous Infusions:   PRN Meds: acetaminophen **OR** acetaminophen, albuterol, bisacodyl, HYDROmorphone (DILAUDID) injection, iohexol, lidocaine, liver oil-zinc oxide, morphine injection, nitroGLYCERIN, ondansetron **OR** ondansetron (ZOFRAN) IV, oxyCODONE, senna, traZODone  Physical Exam         Awake alert No distress  Vital Signs: BP (!) 156/61   Pulse 85   Temp 97.6 F (36.4 C) (Oral)   Resp 18   Ht 5\' 6"  (1.676 m)   Wt 53.5 kg   LMP 10/07/1974   SpO2 98%   BMI 19.04 kg/m  SpO2: SpO2: 98 % O2 Device: O2 Device: Room Air O2 Flow Rate: O2 Flow Rate (L/min): 2 L/min  Intake/output summary:  Intake/Output Summary (Last 24 hours) at 11/28/2023 1208 Last data filed at 11/28/2023 1013 Gross per 24 hour  Intake 720 ml  Output --  Net 720 ml   LBM: Last BM Date : 11/27/23 Baseline Weight: Weight: 53.5 kg Most recent weight: Weight: 53.5 kg       Palliative Assessment/Data:      Patient Active Problem List   Diagnosis Date Noted   Bicytopenia 11/27/2023   Hypokalemia 11/27/2023   New onset a-fib (HCC) 11/24/2023   Precordial pain 11/24/2023    Elevated troponin 11/24/2023   Anemia 11/24/2023   Malignant neoplasm of ovary (HCC) 11/24/2023   AKI (acute kidney injury) (HCC) 11/21/2023   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) 11/12/2023   Infection of hand 02/02/2022   Genetic testing 07/20/2019   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/20/2019   Family history of breast cancer    Carcinoma of upper-outer quadrant  of left breast in female, estrogen receptor positive (HCC) 05/19/2019   Left carotid bruit 01/21/2017   Hypertension    Hyperlipidemia    GERD (gastroesophageal reflux disease)    Ureteral calculi    History of breast cancer    History of ovarian cancer    History of skin cancer    Frequency of urination    Dysuria    Kidney stones    CAD (coronary artery disease)    Ductal carcinoma (HCC) 08/11/2013   Serous tumor, 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    Palliative Care Assessment & Plan   Patient Profile:    Assessment:  Breast cancer with mets to liver spine and peritoneum Back and abdominal pain Functional decline  Recommendations/Plan:  Call placed and discussed with daughter. Continue to monitor hospital course. TRH and TOC also in touch with daughter.     Code Status:    Code Status Orders  (From admission, onward)           Start     Ordered   11/21/23 1502  Do not attempt resuscitation (DNR)- Limited -Do Not Intubate (DNI)  Continuous       Question Answer Comment  If pulseless and not breathing No CPR or chest compressions.   In Pre-Arrest Conditions (Patient Is Breathing and Has A Pulse) Do not intubate. Provide all appropriate non-invasive medical interventions. Avoid ICU transfer unless indicated or required.   Consent: Discussion documented in EHR or advanced directives reviewed      11/21/23 1503           Code Status History     Date Active Date Inactive Code Status Order ID Comments User Context   10/30/2023 1130 10/31/2023 0511 Full Code  [id number]  Irish Lack, MD [facility]   02/02/2022 0215 02/03/2022 1625 Full Code [id number]  Hillary Bow, DO ED      Advance Directive Documentation    Flowsheet Row Most Recent Value  Type of Advance Directive Healthcare Power of Attorney  Pre-existing out of facility DNR order (yellow form or pink MOST form) --  "MOST" Form in Place? --       Prognosis:  Unable to determine  Discharge Planning: Skilled Nursing Facility for rehab with Palliative care service follow-up recommended if possible.  Recommended outpatient palliative care.  Care plan was discussed with  IDT  Thank you for allowing the Palliative Medicine Team to assist in the care of this patient.  Low MDM     Greater than 50%  of this time was spent counseling and coordinating care related to the above assessment and plan.  Rosalin Hawking, MD  Please contact Palliative Medicine Team phone at [phone] for questions and concerns.

## 2023-11-28 NOTE — Progress Notes (Addendum)
 [name]   DOB:Aug 19, 1934   EA#:[id number]      ASSESSMENT & PLAN:  1.  Breast cancer with mets to liver, spine, peritoneum -Initially diagnosed with right-sided breast cancer in 2010.  She is status postlumpectomy, right breast RT and anastrozole which was completed in 2015. 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  Subsequently had a left breast lumpectomy with radioactive seed localization in 2020. -More recently, status post Faslodex and abemaciclib 2 weeks ago.  This is currently on hold. - Consideration for continuing oncologic systemic therapy versus palliative care.  May resume therapy if she improves. - Encourage patient to stay out of bed and increase dietary intake.   - On radiation therapy x 10 fractions, tolerating well  - Follows with medical oncology/Dr. Truett Powell.  Continue follow-up as outpatient upon discharge.   2.  Severe back pain/abdominal pain -Improving - Admitted with severe back pain and abdominal pain. - Continue judicious pain management. - Continue PT   3.  Failure to thrive/dehydration Constipation - Admitted with failure to thrive, dehydration, and constipation. -Improving   4.  Anemia, macrocytic - Hemoglobin stable 8.3 today    - Likely multifactorial - No transfusional intervention warranted at this time - Will continue to monitor CBC with differential   5.  AKI - Creatinine improving 1.57 today - Avoid nephrotoxic agents - Continue gentle hydration   6.  History of ovarian CA - In 2009.  No recurrence to date.   7.  New onset A-fib -Elevated troponin - Seen by cardiology, not a candidate for [facility].      Code Status DNR-limited  Subjective:  Patient seen awake and alert sitting up in chair at bedside.  Reports she is feeling a little better although has some slight back pain.  Asking if she can stay until Monday as she is concerned about being by herself at home.  No other acute distress is noted.  Objective:  Vitals:    11/27/23 2110 11/28/23 0702  BP: 139/73 (!) 156/61  Pulse: 76 85  Resp: 18 18  Temp: 98.6 F (37 C) 97.6 F (36.4 C)  SpO2: 95% 98%     Intake/Output Summary (Last 24 hours) at 11/28/2023 4782 Last data filed at 11/27/2023 2135 Gross per 24 hour  Intake 700 ml  Output --  Net 700 ml     REVIEW OF SYSTEMS:   Constitutional: + Fatigue + slight back pain  Eyes: Denies blurriness of vision, double vision or watery eyes Ears, nose, mouth, throat, and face: Denies mucositis or sore throat Respiratory: Denies cough, dyspnea or wheezes Cardiovascular: Denies palpitation, chest discomfort or lower extremity swelling Gastrointestinal: + Improved abdominal pain Skin: Denies abnormal skin rashes Lymphatics: Denies new lymphadenopathy or easy bruising Neurological: Denies numbness, tingling or new weaknesses Behavioral/Psych: Mood is stable, no new changes  All other systems were reviewed with the patient and are negative.  PHYSICAL EXAMINATION: ECOG PERFORMANCE STATUS: 2 - Symptomatic, <50% confined to bed  Vitals:   11/27/23 2110 11/28/23 0702  BP: 139/73 (!) 156/61  Pulse: 76 85  Resp: 18 18  Temp: 98.6 F (37 C) 97.6 F (36.4 C)  SpO2: 95% 98%   Filed Weights   11/21/23 1525  Weight: 117 lb 15.1 oz (53.5 kg)    GENERAL: alert, no distress and comfortable SKIN: +Pale skin color, texture, turgor are normal, no rashes or significant lesions EYES: normal, conjunctiva are pink and non-injected, sclera clear OROPHARYNX: no exudate, no erythema  and lips, buccal mucosa, and tongue normal  NECK: supple, thyroid normal size, non-tender, without nodularity LYMPH: no palpable lymphadenopathy in the cervical, axillary or inguinal LUNGS: clear to auscultation and percussion with normal breathing effort HEART: regular rate & rhythm and no murmurs and no lower extremity edema ABDOMEN: abdomen soft, non-tender and normal bowel sounds MUSCULOSKELETAL: no cyanosis of digits and no  clubbing  PSYCH: alert & oriented x 3 with fluent speech NEURO: no focal motor/sensory deficits   All questions were answered. The patient knows to call the clinic with any problems, questions or concerns.   The total time spent in the appointment was 40 minutes encounter with patient including review of chart and various tests results, discussions about plan of care and coordination of care plan  Alison Bills, NP 11/28/2023 9:21 AM    Labs Reviewed:  Lab Results  Component Value Date   WBC 4.1 11/28/2023   HGB 8.3 (L) 11/28/2023   HCT 25.7 (L) 11/28/2023   MCV 102.4 (H) 11/28/2023   PLT 73 (L) 11/28/2023   Recent Labs    11/23/23 0648 11/24/23 0711 11/25/23 0520 11/26/23 0535 11/27/23 0516 11/28/23 0514  NA 139 139   < > 137 138 138  K 3.1* 2.9*   < > 3.0* 3.0* 3.5  CL 111 114*   < > 113* 110 112*  CO2 17* 15*   < > 18* 20* 18*  GLUCOSE 74 116*   < > 124* 136* 128*  BUN 102* 98*   < > 75* 69* 53*  CREATININE 2.37* 2.15*   < > 1.84* 1.78* 1.57*  CALCIUM 7.7* 8.0*   < > 7.8* 7.9* 7.9*  GFRNONAA 19* 22*   < > 26* 27* 32*  PROT 4.1* 4.2*  --   --  4.0*  --   ALBUMIN 2.2* 2.3*  --   --  2.3*  --   AST 88* 37  --   --  16  --   ALT 568* 382*  --   --  131*  --   ALKPHOS 122 111  --   --  76  --   BILITOT 1.5* 0.9  --   --  0.8  --   BILIDIR  --   --   --   --  0.2  --   IBILI  --   --   --   --  0.6  --    < > = values in this interval not displayed.    Studies Reviewed:  ECHOCARDIOGRAM COMPLETE Result Date: 11/25/2023    ECHOCARDIOGRAM REPORT   Patient Name:   [name] Date of Exam: 11/25/2023 Medical Rec #:  [id number]        Height:       66.0 in Accession #:    [id number]       Weight:       117.9 lb Date of Birth:  13-Sep-1934       BSA:          1.598 m Patient Age:    89 years         BP:           144/56 mmHg Patient Gender: F                HR:           88 bpm. Exam Location:  Inpatient Procedure: 2D Echo, Cardiac Doppler and Color Doppler (Both Spectral  and  Color            Flow Doppler were utilized during procedure). Indications:    Chest Pain R07.9  History:        Patient has prior history of Echocardiogram examinations, most                 recent 03/22/2022. CAD; Risk Factors:Hypertension.  Sonographer:    Webb Laws Referring Phys: [id number] SUNIT TOLIA IMPRESSIONS  1. Severe basal septal hypertrophy on off axis imaging; maximal septal thickness 20 mm. Maximal apical hypertrophy 20 mm. No resting gradient noted. Left ventricular ejection fraction, by estimation, is 65 to 70%. The left ventricle has normal function.  The left ventricle has no regional wall motion abnormalities. There is severe asymmetric left ventricular hypertrophy of the basal-septal segment. Left ventricular diastolic function could not be evaluated.  2. Right ventricular systolic function is normal. The right ventricular size is normal.  3. Left atrial size was mildly dilated.  4. The mitral valve is normal in structure. No evidence of mitral valve regurgitation. No evidence of mitral stenosis. The mean mitral valve gradient is 2.0 mmHg. Moderate mitral annular calcification.  5. The aortic valve is tricuspid. There is mild calcification of the aortic valve. Aortic valve regurgitation is mild. No aortic stenosis is present.  6. The inferior vena cava is normal in size with greater than 50% respiratory variability, suggesting right atrial pressure of 3 mmHg. Comparison(s): Prior images reviewed side by side. Aortic regurgitation not seen in 2023 study. Maximal basal septal thickness 14 mm on better quality long axis view. Apex not well seen in 2023 study. Conclusion(s)/Recommendation(s): Consider outpatient CMR for clarification of hypertrophy if clinically indicated. FINDINGS  Left Ventricle: Severe basal septal hypertrophy on off axis imaging; maximal septal thickness 20 mm. Maximal apical hypertrophy 20 mm. No resting gradient noted. Left ventricular ejection fraction, by estimation,  is 65 to 70%. The left ventricle has normal function. The left ventricle has no regional wall motion abnormalities. Global longitudinal strain performed but not reported based on interpreter judgement due to suboptimal tracking. The left ventricular internal cavity size was normal in size. There is severe asymmetric left ventricular hypertrophy of the basal-septal segment. Left ventricular diastolic function could not be evaluated due to mitral annular calcification (moderate or greater). Left ventricular diastolic function could not be evaluated. Right Ventricle: The right ventricular size is normal. No increase in right ventricular wall thickness. Right ventricular systolic function is normal. Left Atrium: Left atrial size was mildly dilated. Right Atrium: Right atrial size was normal in size. Pericardium: There is no evidence of pericardial effusion. Mitral Valve: The mitral valve is normal in structure. Moderate mitral annular calcification. No evidence of mitral valve regurgitation. No evidence of mitral valve stenosis. The mean mitral valve gradient is 2.0 mmHg. Tricuspid Valve: The tricuspid valve is normal in structure. Tricuspid valve regurgitation is mild . No evidence of tricuspid stenosis. Aortic Valve: The aortic valve is tricuspid. There is mild calcification of the aortic valve. Aortic valve regurgitation is mild. No aortic stenosis is present. Pulmonic Valve: The pulmonic valve was not well visualized. Pulmonic valve regurgitation is not visualized. No evidence of pulmonic stenosis. Aorta: The aortic root and ascending aorta are structurally normal, with no evidence of dilitation. Venous: The inferior vena cava is normal in size with greater than 50% respiratory variability, suggesting right atrial pressure of 3 mmHg. IAS/Shunts: The atrial septum is grossly normal. Additional Comments: 3D imaging was not performed.  LEFT  VENTRICLE PLAX 2D LVIDd:         4.00 cm   Diastology LVIDs:         3.20 cm    LV e' medial:    5.11 cm/s LV PW:         1.25 cm   LV E/e' medial:  21.7 LV IVS:        1.10 cm   LV e' lateral:   9.03 cm/s LVOT diam:     1.70 cm   LV E/e' lateral: 12.3 LV SV:         51 LV SV Index:   32 LVOT Area:     2.27 cm  RIGHT VENTRICLE             IVC RV Basal diam:  3.00 cm     IVC diam: 1.40 cm RV S prime:     11.60 cm/s TAPSE (M-mode): 1.5 cm LEFT ATRIUM             Index        RIGHT ATRIUM          Index LA diam:        4.50 cm 2.82 cm/m   RA Area:     7.16 cm LA Vol (A2C):   54.4 ml 34.05 ml/m  RA Volume:   13.10 ml 8.20 ml/m LA Vol (A4C):   47.2 ml 29.54 ml/m LA Biplane Vol: 54.4 ml 34.05 ml/m  AORTIC VALVE LVOT Vmax:   145.50 cm/s LVOT Vmean:  89.250 cm/s LVOT VTI:    0.224 m  AORTA Ao Root diam: 2.70 cm Ao Asc diam:  3.10 cm MITRAL VALVE                TRICUSPID VALVE MV Area (PHT): 4.15 cm     TR Peak grad:   25.0 mmHg MV Mean grad:  2.0 mmHg     TR Vmax:        250.00 cm/s MV Decel Time: 183 msec MV E velocity: 111.00 cm/s  SHUNTS                             Systemic VTI:  0.22 m                             Systemic Diam: 1.70 cm Riley Lam MD Electronically signed by Riley Lam MD Signature Date/Time: 11/25/2023/9:28:52 AM    Final    CT ABDOMEN PELVIS WO CONTRAST Result Date: 11/23/2023 CLINICAL DATA:  Metastatic breast cancer. Acute abdominal pain. Acute kidney injury. * Tracking Code: BO * EXAM: CT ABDOMEN AND PELVIS WITHOUT CONTRAST TECHNIQUE: Multidetector CT imaging of the abdomen and pelvis was performed following the standard protocol without IV contrast. RADIATION DOSE REDUCTION: This exam was performed according to the departmental dose-optimization program which includes automated exposure control, adjustment of the mA and/or kV according to patient size and/or use of iterative reconstruction technique. COMPARISON:  10/16/2023 FINDINGS: Lower chest: Right coronary artery descending thoracic aortic atheromatous vascular calcification. Trace pericardial  effusion. Mitral valve calcification. Small bilateral pleural effusions with passive atelectasis. Small type 1 hiatal hernia. Scattered small pulmonary nodules in the lung bases as on 10/16/2023, probably from metastatic disease. Hepatobiliary: Scattered hepatic hypodense lesions compatible with metastatic disease. A confluence of two lesions posteriorly in the right hepatic lobe measures 4.1 by 2.2 cm on image 8 series  2. A medial hypodense lesion posteriorly in the right hepatic lobe measures 4.4 by 3.1 cm on image 10 series 2, formerly about 4.0 by 2.7 cm. Cholecystectomy. Distal common bile duct proximally 9 mm in diameter, borderline prominent for age. Pancreas: Atrophic, otherwise unremarkable. Spleen: Unremarkable Adrenals/Urinary Tract: 2.0 by 2.0 cm right adrenal mass, internal density 34 Hounsfield units which is indeterminate. Bilateral nonobstructive renal calculi. Mild right hydronephrosis and proximal hydroureter extending to the iliac vessel cross over, however with no distal hydroureter. Foley catheter in the urinary bladder which is mostly empty although there is a small amount of gas in the lumen of the urinary bladder. Stomach/Bowel: Small periampullary duodenal diverticulum. Large caliber distal colon and rectum containing progressive dilution of contrast medium as well as stool contents. Prior bowel resections include the right colon and at least part of the ileum. Vascular/Lymphatic: Atherosclerosis is present, including aortoiliac atherosclerotic disease. Reproductive: Uterus absent.  Adnexa unremarkable. Other: No supplemental non-categorized findings. Musculoskeletal: We partially include what appears to be a compression fracture and possible lytic lesions involving the T11 vertebral body. Suspected right eleventh rib fracture medially. Lucency eccentric to the left in the T12 vertebral body may be from metastatic disease. Bony destructive findings in the posterior elements at L4 suspicious  for osseous metastatic disease. Grade 1 anterolisthesis at L5-S1. Lucency in the left iliac wing on image 53 series 4 is probably a metastatic lesion. There is also lucency further medially in the left iliac bone on image 77 series 4. IMPRESSION: 1. Mild right hydronephrosis and proximal hydroureter extending to the iliac vessel cross over, however with no distal hydroureter. This could be from a distal ureteral stricture or from a recently passed calculus. 2. Bilateral nonobstructive renal calculi. 3. Scattered hepatic hypodense lesions compatible with metastatic disease. 4. Scattered small pulmonary nodules in the lung bases as on 10/16/2023, probably from metastatic disease. 5. 2.0 by 2.0 cm right adrenal mass, internal density 34 Hounsfield units which is indeterminate but quite likely metastatic. 6. We partially include what appears to be a compression fracture and possible lytic lesions involving the T11 vertebral body. Suspected right eleventh rib fracture medially. Bony destructive findings in the posterior elements at L4 suspicious for osseous metastatic disease. Left iliac lytic lesions suspicious for metastatic disease. 7. Small bilateral pleural effusions with passive atelectasis. 8. Small type 1 hiatal hernia. 9. Aortic and coronary atherosclerosis. Aortic Atherosclerosis (ICD10-I70.0). Electronically Signed   By: Gaylyn Rong M.D.   On: 11/23/2023 17:51   US RENAL Result Date: 11/21/2023 CLINICAL DATA:  Acute kidney injury. EXAM: RENAL / URINARY TRACT ULTRASOUND COMPLETE COMPARISON:  CT 10/16/2023 FINDINGS: Right Kidney: Renal measurements: 10.1 x 4.4 x 4.7 cm = volume: 110 mL. Moderate hydronephrosis is new from prior CT. Mild increased renal parenchymal echogenicity typical of chronic renal disease. Renal calculi on prior CT not well-defined on the current exam. No evidence of focal lesion. Left Kidney: Renal measurements: 9 x 4.1 x 4.3 cm = volume: 83 mL. No hydronephrosis. Thinning of  renal parenchyma with increased echogenicity. Shadowing 9 mm stone. Small renal cysts, largest 1.7 cm. No evidence of suspicious lesion. Bladder: Appears normal for degree of bladder distention. Other: None. IMPRESSION: 1. Moderate right hydronephrosis is new from prior CT. 2. Increased renal parenchymal echogenicity typical of chronic medical renal disease. 3. Left renal stone. Known right renal calculi on prior CT not well seen. Electronically Signed   By: Narda Rutherford M.D.   On: 11/21/2023 22:06   US  BIOPSY (LIVER) Result Date: 10/30/2023 INDICATION: History of prior left breast carcinoma. Imaging evidence metastatic disease in the liver, multiple skeletal sites and possibly lungs. The patient presents for liver lesion biopsy. EXAM: ULTRASOUND GUIDED CORE BIOPSY OF LIVER MEDICATIONS: None. ANESTHESIA/SEDATION: Moderate (conscious) sedation was employed during this procedure. A total of Versed 1.0 mg and Fentanyl 50 mcg was administered intravenously. Moderate Sedation Time: 15 minutes. The patient's level of consciousness and vital signs were monitored continuously by radiology nursing throughout the procedure under my direct supervision. PROCEDURE: The procedure, risks, benefits, and alternatives were explained to the patient. Questions regarding the procedure were encouraged and answered. The patient understands and consents to the procedure. A time-out was performed prior to initiating the procedure. The abdominal wall was prepped with exiting in a sterile fashion, and a sterile drape was applied covering the operative field. A sterile gown and sterile gloves were used for the procedure. Local anesthesia was provided with 1% Lidocaine. Ultrasound was performed to localize liver lesions. A 17 gauge trocar needle was advanced into the liver at the level of a lesion within the right lobe. Three separate coaxial 18 gauge core biopsy samples were obtained and submitted in formalin. Additional ultrasound was  performed after removal of the outer needle. COMPLICATIONS: None immediate. FINDINGS: Multiple rounded hypoechoic mass lesions are seen throughout the liver parenchyma. A lesion within the medial right lobe was chosen for sampling measuring approximately 2.5 x 2.2 x 2.5 cm. Solid tissue was obtained. IMPRESSION: Ultrasound-guided core biopsy performed of a mass lesion within the right lobe of the liver measuring 2.5 cm in maximum diameter. Electronically Signed   By: Irish Lack M.D.   On: 10/30/2023 11:42   Ms. Guilmette was interviewed and examined.  She continues to have improved pain.  She is eating.  She reports she has not finalize disposition plans with her daughter.  We discussed her prognosis.  She would like to continue treatment of the breast cancer following discharge from the hospital.  I recommend skilled nursing facility placement with physical therapy. Renal function and elevated liver enzymes have improved.  She has developed progressive anemia/thrombocytopenia.  The hematologic findings are secondary to metastatic breast cancer involving the bone marrow, toxicity from Abemaciclib, and radiation. Recommendations: Continue palliative radiation to the spine Increase out of bed and ambulation as tolerated Check CBC 11/30/2023 Social work consult to discuss skilled nursing facility placement with Ms. Vandoren and her daughter Oxycodone as needed for pain Please call oncology over the weekend as needed.  I will check on her 12/01/2023

## 2023-11-28 NOTE — Discharge Planning (Signed)
 Pt transferred from bed to [facility] with assistance. Pt extremely weak, unable to walk in hall even with walker and staff assistance.  This AM RN asked OT to place destin cream on pt after pt used bathroom.  OT told RN that pt was too weak to stand long enough to apply cream to backside. Had to transfer straight to chair with assistance. Unable to stand for 1 min at bedside.Plan has been to send pt home, although she lives alone. RN as been notifying care team and  Provider everyday and plan has remained the same. PT asked to reassess mobility in am since pt seems very weak the past few days.

## 2023-11-28 NOTE — Progress Notes (Signed)
 PROGRESS NOTE  [name]    DOB: [birth year], 89 y.o.  WUJ:[id number]    Code Status: Limited: Do not attempt resuscitation (DNR) -DNR-LIMITED -Do Not Intubate/DNI    DOA: 11/21/2023   LOS: 7   Brief hospital course  [name] is a 89 y.o. 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 She began palliative radiation to her spine 11/18/2023   11/28/23 -feels that her back pain is somewhat worse today. We discussed planning to go home and she needs more time to arrange for help at home so will plan to dc home tomorrow morning with HH.   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 She appears to be tolerating well with the exception of feeling fatigued. -Analgesia as needed -PT/OT  Thrombocytopenia-patient had significant decrease in platelets 163> 78> 73 today.  No signs of active bleeding. -CBC a.m. to monitor -Per oncology she is okay to continue with radiation treatments  AKI-in the setting of CKD stage III, likely due to ATN from hypotension and dehydration. Improving significantly.  Creatinine has gradually been improving 1.57 today from 3.07 on presentation. -Continue to follow and encourage p.o. hydration   Chest pain-intermittent.  Described as pressure after eating.  Concern for dysphagia -SLP consulted- planning swallow evaluation today.  -Patient has history of  CAD -Cardiology was consulted, no aggressive intervention recommended   A-fib with RVR-Likely new onset.  Appears to be rate controlled at this time. -Continue home Coreg 25 mg p.o. twice daily, which was initially on hold due to hypotension -Continue Cardizem which is now increased to 90 mg twice daily. -Not a candidate for anticoagulation -Cardiology following   History of hypertension -Imdur, amlodipine on hold due to hypotension    Abnormal LFTs -Secondary to hepatic metastasis   Cancer-related pain-much improved with treatment -Continue oxycodone, IV morphine as needed   Constipation-resolved -Continue bowel regimen   Hypokalemia-improving K+ 3.0>3.5 -monitor  Body mass index is 19.04 kg/m.  VTE ppx: Place and maintain sequential compression device Start: 11/27/23 0732 SCDs Start: 11/21/23 1502  Diet:     Diet   Diet regular Room service appropriate? Yes; Fluid consistency: Thin   Consultants: Oncology  radiation oncology  Subjective 11/28/23    Pt reports feeling well today. She thinks her pain in back may be coming back so wants to discuss with radiation oncology again. She would like to go home tomorrow after being able to arrange more help at home   Objective   Vitals:   11/27/23 1300 11/27/23 1645 11/27/23 2110 11/28/23 0702  BP: (!) 135/54 (!) 135/54 139/73 (!) 156/61  Pulse:   76 85  Resp:   18 18  Temp:   98.6 F (37 C) 97.6 F (36.4 C)  TempSrc:   Oral Oral  SpO2:   95% 98%  Weight:      Height:        Intake/Output Summary (Last  24 hours) at 11/28/2023 0723 Last data filed at 11/27/2023 2135 Gross per 24 hour  Intake 700 ml  Output --  Net 700 ml   Filed Weights   11/21/23 1525  Weight: 53.5 kg     Physical Exam:  General: awake, alert, NAD.  Tired appearing HEENT: atraumatic, clear conjunctiva, anicteric sclera, dry mucous membranes, hearing grossly normal Respiratory: normal respiratory effort.  CTAB Cardiovascular: quick  capillary refill, normal S1/S2, RRR, no JVD, murmurs Gastrointestinal: soft, NT, ND Nervous: A&O x3. no gross focal neurologic deficits, normal speech Extremities: moves all equally, no edema, normal tone Skin: dry, intact, normal temperature, normal color. No rashes, lesions or ulcers on exposed skin Psychiatry: normal mood, congruent affect  Labs   I have personally reviewed the following labs and imaging studies CBC    Component Value Date/Time   WBC 4.1 11/28/2023 0514   RBC 2.51 (L) 11/28/2023 0514   HGB 8.3 (L) 11/28/2023 0514   HGB 10.0 (L) 11/21/2023 0840   HGB 8.8 (L) 02/19/2008 1135   HCT 25.7 (L) 11/28/2023 0514   HCT 26.0 (L) 02/19/2008 1135   PLT 73 (L) 11/28/2023 0514   PLT 198 11/21/2023 0840   PLT 244 02/19/2008 1135   MCV 102.4 (H) 11/28/2023 0514   MCV 89.0 02/19/2008 1135   MCH 33.1 11/28/2023 0514   MCHC 32.3 11/28/2023 0514   RDW 15.7 (H) 11/28/2023 0514   RDW 22.9 (H) 02/19/2008 1135   LYMPHSABS 0.3 (L) 11/21/2023 0840   LYMPHSABS 0.3 (L) 02/19/2008 1135   MONOABS 0.1 11/21/2023 0840   MONOABS 0.9 02/19/2008 1135   EOSABS 0.0 11/21/2023 0840   EOSABS 0.1 02/19/2008 1135   BASOSABS 0.0 11/21/2023 0840   BASOSABS 0.0 02/19/2008 1135      Latest Ref Rng & Units 11/28/2023    5:14 AM 11/27/2023    5:16 AM 11/26/2023    5:35 AM  BMP  Glucose 70 - 99 mg/dL 540  981  191   BUN 8 - 23 mg/dL 53  69  75   Creatinine 0.44 - 1.00 mg/dL 4.78  2.95  6.21   Sodium 135 - 145 mmol/L 138  138  137   Potassium 3.5 - 5.1 mmol/L 3.5  3.0  3.0   Chloride 98 - 111 mmol/L 112  110  113   CO2 22 - 32 mmol/L 18  20  18    Calcium 8.9 - 10.3 mg/dL 7.9  7.9  7.8     No results found.   Disposition Plan & Communication  Patient status: Inpatient  Admitted From: Home Planned disposition location: Home Anticipated discharge date: Possibly 2/22 after radiation and evaluation of her swallowing.  Family Communication: daughter on phone   Author: Leeroy Bock,  DO [facility] 11/28/2023, 7:23 AM   Available by Epic secure chat 7AM-7PM. If 7PM-7AM, please contact night-coverage.  [facility] contact information found on [email or web address].

## 2023-11-29 DIAGNOSIS — Z7189 Other specified counseling: Secondary | ICD-10-CM | POA: Diagnosis not present

## 2023-11-29 DIAGNOSIS — N179 Acute kidney failure, unspecified: Secondary | ICD-10-CM | POA: Diagnosis not present

## 2023-11-29 DIAGNOSIS — R531 Weakness: Secondary | ICD-10-CM | POA: Diagnosis not present

## 2023-11-29 DIAGNOSIS — Z515 Encounter for palliative care: Secondary | ICD-10-CM | POA: Diagnosis not present

## 2023-11-29 LAB — BASIC METABOLIC PANEL
Anion gap: 8 (ref 5–15)
BUN: 46 mg/dL — ABNORMAL HIGH (ref 8–23)
CO2: 20 mmol/L — ABNORMAL LOW (ref 22–32)
Calcium: 7.8 mg/dL — ABNORMAL LOW (ref 8.9–10.3)
Chloride: 110 mmol/L (ref 98–111)
Creatinine, Ser: 1.46 mg/dL — ABNORMAL HIGH (ref 0.44–1.00)
GFR, Estimated: 35 mL/min — ABNORMAL LOW (ref >60)
Glucose, Bld: 131 mg/dL — ABNORMAL HIGH (ref 70–99)
Potassium: 3.2 mmol/L — ABNORMAL LOW (ref 3.5–5.1)
Sodium: 138 mmol/L (ref 135–145)

## 2023-11-29 MED ORDER — POTASSIUM CHLORIDE CRYS ER 20 MEQ PO TBCR
40.0000 meq | EXTENDED_RELEASE_TABLET | ORAL | Status: AC
  Administered 2023-11-29 (×2): 40 meq via ORAL
  Filled 2023-11-29 (×2): qty 2

## 2023-11-29 MED ORDER — CALCIUM CARBONATE ANTACID 500 MG PO CHEW: 400.0000 mg | CHEWABLE_TABLET | Freq: Three times a day (TID) | ORAL | Status: DC | PRN

## 2023-11-29 MED ORDER — LOPERAMIDE HCL 2 MG PO CAPS
2.0000 mg | ORAL_CAPSULE | ORAL | Status: DC | PRN
  Administered 2023-11-30 – 2023-12-03 (×5): 2 mg via ORAL
  Filled 2023-11-29 (×5): qty 1

## 2023-11-29 NOTE — Progress Notes (Signed)
 Cardiology following peripherally:  Temp:  [97.6 F (36.4 C)-98.3 F (36.8 C)] 98 F (36.7 C) (02/22 0558) Pulse Rate:  [80-96] 88 (02/22 0558) Cardiac Rhythm: Atrial flutter (02/22 0700) Resp:  [18-20] 20 (02/22 0558) BP: (142-156)/(60-78) 147/78 (02/22 0558) SpO2:  [98 %-99 %] 98 % (02/22 0558)  Lab Results  Component Value Date   WBC 4.1 11/28/2023   HGB 8.3 (L) 11/28/2023   HCT 25.7 (L) 11/28/2023   MCV 102.4 (H) 11/28/2023   PLT 73 (L) 11/28/2023      Latest Ref Rng & Units 11/28/2023    5:14 AM 11/27/2023    5:16 AM 11/26/2023    5:35 AM  CMP  Glucose 70 - 99 mg/dL 161  096  045   BUN 8 - 23 mg/dL 53  69  75   Creatinine 0.44 - 1.00 mg/dL 4.09  8.11  9.14   Sodium 135 - 145 mmol/L 138  138  137   Potassium 3.5 - 5.1 mmol/L 3.5  3.0  3.0   Chloride 98 - 111 mmol/L 112  110  113   CO2 22 - 32 mmol/L 18  20  18    Calcium 8.9 - 10.3 mg/dL 7.9  7.9  7.8   Total Protein 6.5 - 8.1 g/dL  4.0    Total Bilirubin 0.0 - 1.2 mg/dL  0.8    Alkaline Phos 38 - 126 U/L  76    AST 15 - 41 U/L  16    ALT 0 - 44 U/L  131     Hb down trending.   Spoke to central telemetry - remains in Afib but rates are well controlled.   Continue coreg and Cardizem ( converted to daily dose closer to discharge).   Please reach out if questions or concern arise.   No charge.   Wesam Gearhart, DO, [facility]  7:40 AM

## 2023-11-29 NOTE — Progress Notes (Signed)
 Progress Note    [name]  BJY:[id number] DOB: 07/30/1934  DOA: 11/21/2023 PCP: Lorenda Ishihara, MD      Brief Narrative:    Medical records reviewed and are as summarized below:  [name] is a 89 y.o. 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 She began palliative radiation to her spine 11/18/2023    Assessment/Plan:   Principal Problem:   AKI (acute kidney injury) (HCC) Active Problems:   New onset a-fib (HCC)   Precordial pain   Elevated troponin   Anemia   Malignant neoplasm of ovary (HCC)   Bicytopenia   Hypokalemia    Body mass index is 19.04 kg/m.   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 Analgesics as needed for pain   Thrombocytopenia-patient had significant decrease in platelets 163> 78> 73.  No signs of active bleeding. Repeat CBC tomorrow. -Per oncology she is okay to continue with radiation treatments   AKI-in the setting of CKD stage III, likely due to ATN from hypotension and dehydration. Creatinine has improved to baseline.   Chest pain-intermittent.  Described as pressure after eating.  Concern for dysphagia Speech therapist recommended regular diet and thin liquids. -Patient has history of CAD -Cardiology was consulted, no aggressive intervention recommended   A-fib with RVR-Likely new onset.  Heart rate is better. Continue Coreg and Cardizem -Not a candidate for anticoagulation -Cardiology following  Elevated liver enzymes Probably due to hepatic metastasis  Hypokalemia Replete  potassium and monitor levels  Constipation Continue laxatives  General Weakness Continue PT and OT.  PT to reevaluate to determine eligibility for SNF.      Diet Order             Diet regular Room service appropriate? Yes; Fluid consistency: Thin  Diet effective now                            Consultants: Cardiologist Oncologist Palliative care  Procedures: None    Medications:    carvedilol  25 mg Oral BID WC   Chlorhexidine Gluconate Cloth  6 each Topical Daily   diltiazem  90 mg Oral Q12H   famotidine  20 mg Oral Daily   feeding supplement  237 mL Oral BID BM   Continuous Infusions:   Anti-infectives (From admission, onward)    None              Family Communication/Anticipated D/C date and plan/Code Status   DVT prophylaxis: Place and maintain sequential compression device Start: 11/27/23 0732 SCDs Start: 11/21/23 1502     Code Status: Limited: Do not attempt resuscitation (DNR) -DNR-LIMITED -Do Not Intubate/DNI   Family Communication: None Disposition Plan: Plan to discharge to SNF   Status is: Inpatient Remains inpatient appropriate because: General weakness, may need SNF at discharge       Subjective:   Interval events noted.  She complains of general weakness.  She thinks she will need about a week of rehab.   Objective:    Vitals:   11/28/23 2053 11/29/23 0558 11/29/23 0824 11/29/23 1146  BP: (!) 144/60 Marland Kitchen)  147/78 (!) 147/78 127/65  Pulse: 96 88  78  Resp: 18 20  18   Temp: 98.3 F (36.8 C) 98 F (36.7 C)  98.1 F (36.7 C)  TempSrc: Oral Oral  Oral  SpO2: 99% 98%  99%  Weight:      Height:       No data found.   Intake/Output Summary (Last 24 hours) at 11/29/2023 1519 Last data filed at 11/29/2023 0400 Gross per 24 hour  Intake 480 ml  Output --  Net 480 ml   Filed Weights   11/21/23 1525  Weight: 53.5 kg    Exam:  GEN: NAD SKIN: Warm and dry EYES: No pallor or icterus ENT: MMM CV:  Irregular rate and rhythm PULM: CTA B ABD: soft, ND, NT, +BS CNS: AAO x 3, non focal EXT: No edema or tenderness        Data Reviewed:   I have personally reviewed following labs and imaging studies:  Labs: Labs show the following:   Basic Metabolic Panel: Recent Labs  Lab 11/24/23 1028 11/25/23 0520 11/26/23 0535 11/27/23 0516 11/28/23 0514 11/29/23 0623  NA  --  135 137 138 138 138  K  --  2.8* 3.0* 3.0* 3.5 3.2*  CL  --  111 113* 110 112* 110  CO2  --  17* 18* 20* 18* 20*  GLUCOSE  --  169* 124* 136* 128* 131*  BUN  --  81* 75* 69* 53* 46*  CREATININE  --  1.87* 1.84* 1.78* 1.57* 1.46*  CALCIUM  --  7.7* 7.8* 7.9* 7.9* 7.8*  MG 1.9 1.9  --   --   --   --    GFR Estimated Creatinine Clearance: 22.9 mL/min (A) (by C-G formula based on SCr of 1.46 mg/dL (H)). Liver Function Tests: Recent Labs  Lab 11/23/23 0648 11/24/23 0711 11/27/23 0516  AST 88* 37 16  ALT 568* 382* 131*  ALKPHOS 122 111 76  BILITOT 1.5* 0.9 0.8  PROT 4.1* 4.2* 4.0*  ALBUMIN 2.2* 2.3* 2.3*   No results for input(s): "LIPASE", "AMYLASE" in the last 168 hours. No results for input(s): "AMMONIA" in the last 168 hours. Coagulation profile No results for input(s): "INR", "PROTIME" in the last 168 hours.  CBC: Recent Labs  Lab 11/23/23 0648 11/24/23 0711 11/27/23 0516 11/28/23 0514  WBC 5.2 5.2 4.1 4.1  HGB 9.8* 9.7* 8.3* 8.3*  HCT 29.9* 30.1* 25.5* 25.7*  MCV 101.4* 101.3* 102.4* 102.4*  PLT 154 163 78* 73*   Cardiac Enzymes: No results for input(s): "CKTOTAL", "CKMB", "CKMBINDEX", "TROPONINI" in the last 168 hours. BNP (last 3 results) No results for input(s): "PROBNP" in the last 8760 hours. CBG: No results for input(s): "GLUCAP" in the last 168 hours. D-Dimer: No results for input(s): "DDIMER" in the last 72 hours. Hgb A1c: No results for input(s): "HGBA1C" in the last 72 hours. Lipid Profile: No results for input(s): "CHOL", "HDL", "LDLCALC", "TRIG", "CHOLHDL",  "LDLDIRECT" in the last 72 hours. Thyroid function studies: No results for input(s): "TSH", "T4TOTAL", "T3FREE", "THYROIDAB" in the last 72 hours.  Invalid input(s): "FREET3" Anemia work up: No results for input(s): "VITAMINB12", "FOLATE", "FERRITIN", "TIBC", "IRON", "RETICCTPCT" in the last 72 hours. Sepsis Labs: Recent Labs  Lab 11/23/23 0648 11/24/23 0711 11/27/23 0516 11/28/23 0514  WBC 5.2 5.2 4.1 4.1    Microbiology No results found for this or any previous visit (from the past 240 hours).  Procedures and diagnostic studies:  No results found.  LOS: 8 days   Kerin Kren  [facility]   Pager on [email or web address]. If 7PM-7AM, please contact night-coverage at [email or web address]     11/29/2023, 3:19 PM

## 2023-11-29 NOTE — Progress Notes (Signed)
 Daily Progress Note   Patient Name: [name]       Date: 11/29/2023 DOB: 1933/12/26  Age: 89 y.o. MRN#: [id number] Attending Physician: Lurene Shadow, MD Primary Care Physician: Lorenda Ishihara, MD Admit Date: 11/21/2023  Reason for Consultation/Follow-up: Establishing goals of care  Subjective: Sitting up in bed, appetite not great, generalized weakness evident.   Length of Stay: 8  Current Medications: Scheduled Meds:   carvedilol  25 mg Oral BID WC   Chlorhexidine Gluconate Cloth  6 each Topical Daily   diltiazem  90 mg Oral Q12H   famotidine  20 mg Oral Daily   feeding supplement  237 mL Oral BID BM    Continuous Infusions:   PRN Meds: acetaminophen **OR** acetaminophen, albuterol, bisacodyl, HYDROmorphone (DILAUDID) injection, iohexol, lidocaine, liver oil-zinc oxide, morphine injection, nitroGLYCERIN, ondansetron **OR** ondansetron (ZOFRAN) IV, oxyCODONE, senna, traZODone  Physical Exam         Awake alert No distress Regular work of breathing Appears chronically ill  Vital Signs: BP (!) 147/78   Pulse 88   Temp 98 F (36.7 C) (Oral)   Resp 20   Ht 5\' 6"  (1.676 m)   Wt 53.5 kg   LMP 10/07/1974   SpO2 98%   BMI 19.04 kg/m  SpO2: SpO2: 98 % O2 Device: O2 Device: Room Air O2 Flow Rate: O2 Flow Rate (L/min): 2 L/min  Intake/output summary:  Intake/Output Summary (Last 24 hours) at 11/29/2023 1059 Last data filed at 11/29/2023 0400 Gross per 24 hour  Intake 480 ml  Output --  Net 480 ml   LBM: Last BM Date : 11/28/23 Baseline Weight: Weight: 53.5 kg Most recent weight: Weight: 53.5 kg       Palliative Assessment/Data:      Patient Active Problem List   Diagnosis Date Noted   Bicytopenia 11/27/2023   Hypokalemia 11/27/2023   New  onset a-fib (HCC) 11/24/2023   Precordial pain 11/24/2023   Elevated troponin 11/24/2023   Anemia 11/24/2023   Malignant neoplasm of ovary (HCC) 11/24/2023   AKI (acute kidney injury) (HCC) 11/21/2023   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) 11/12/2023   Infection of hand 02/02/2022   Genetic testing 07/20/2019   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/20/2019   Family history of breast cancer  Carcinoma of upper-outer quadrant of left breast in female, estrogen receptor positive (HCC) 05/19/2019   Left carotid bruit 01/21/2017   Hypertension    Hyperlipidemia    GERD (gastroesophageal reflux disease)    Ureteral calculi    History of breast cancer    History of ovarian cancer    History of skin cancer    Frequency of urination    Dysuria    Kidney stones    CAD (coronary artery disease)    Ductal carcinoma (HCC) 08/11/2013   Serous tumor, 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     Code Status:    Code Status Orders  (From admission, onward)           Start     Ordered   11/21/23 1502  Do not attempt resuscitation (DNR)- Limited -Do Not Intubate (DNI)  Continuous       Question Answer Comment  If pulseless and not breathing No CPR or chest compressions.   In Pre-Arrest Conditions (Patient Is Breathing and Has A Pulse) Do not intubate. Provide all appropriate non-invasive medical interventions. Avoid ICU transfer unless indicated or required.   Consent: Discussion documented in EHR or advanced directives reviewed      11/21/23 1503           Code Status History      Date Active Date Inactive Code Status Order ID Comments User Context   10/30/2023 1130 10/31/2023 0511 Full Code [id number]  Irish Lack, MD [facility]   02/02/2022 0215 02/03/2022 1625 Full Code [id number]  Hillary Bow, DO ED      Advance Directive Documentation    Flowsheet Row Most Recent Value  Type of Advance Directive Healthcare Power of Attorney  Pre-existing out of facility DNR order (yellow form or pink MOST form) --  "MOST" Form in Place? --       Prognosis:  Unable to determine  Discharge Planning: Skilled Nursing Facility for rehab with Palliative care service follow-up   Recommended outpatient palliative care.  Care plan was discussed with  IDT  Thank you for allowing the Palliative Medicine Team to assist in the care of this patient.  Mod MDM     Greater than 50%  of this time was spent counseling and coordinating care related to the above assessment and plan.  Rosalin Hawking, MD  Please contact Palliative Medicine Team phone at [phone] for questions and concerns.

## 2023-11-29 NOTE — Progress Notes (Signed)
 Mobility Specialist - Progress Note   11/29/23 1121  Mobility  Activity Ambulated with assistance in hallway  Level of Assistance Contact guard assist, steadying assist  Assistive Device Front wheel walker  Distance Ambulated (ft) 40 ft  Range of Motion/Exercises Active  Activity Response Tolerated fair  Mobility Referral Yes  Mobility visit 1 Mobility  Mobility Specialist Start Time (ACUTE ONLY) 1100  Mobility Specialist Stop Time (ACUTE ONLY) 1120  Mobility Specialist Time Calculation (min) (ACUTE ONLY) 20 min   Pt was found in bed and agreeable to ambulate. Stated feeling very weak therefore opted to use RW for ambulation. Pt very fatigued by EOS and returned to recliner chair with all needs met. Call bell in reach and RN notified.  Billey Chang Mobility Specialist

## 2023-11-30 DIAGNOSIS — N179 Acute kidney failure, unspecified: Secondary | ICD-10-CM | POA: Diagnosis not present

## 2023-11-30 DIAGNOSIS — R1310 Dysphagia, unspecified: Secondary | ICD-10-CM

## 2023-11-30 DIAGNOSIS — R627 Adult failure to thrive: Secondary | ICD-10-CM

## 2023-11-30 LAB — CBC WITH DIFFERENTIAL/PLATELET
Abs Immature Granulocytes: 0.05 10*3/uL (ref 0.00–0.07)
Basophils Absolute: 0 10*3/uL (ref 0.0–0.1)
Basophils Relative: 0 %
Eosinophils Absolute: 0 10*3/uL (ref 0.0–0.5)
Eosinophils Relative: 1 %
HCT: 24.4 % — ABNORMAL LOW (ref 36.0–46.0)
Hemoglobin: 7.8 g/dL — ABNORMAL LOW (ref 12.0–15.0)
Immature Granulocytes: 1 %
Lymphocytes Relative: 4 %
Lymphs Abs: 0.2 10*3/uL — ABNORMAL LOW (ref 0.7–4.0)
MCH: 33.2 pg (ref 26.0–34.0)
MCHC: 32 g/dL (ref 30.0–36.0)
MCV: 103.8 fL — ABNORMAL HIGH (ref 80.0–100.0)
Monocytes Absolute: 0.2 10*3/uL (ref 0.1–1.0)
Monocytes Relative: 5 %
Neutro Abs: 3.6 10*3/uL (ref 1.7–7.7)
Neutrophils Relative %: 89 %
Platelets: 70 10*3/uL — ABNORMAL LOW (ref 150–400)
RBC: 2.35 MIL/uL — ABNORMAL LOW (ref 3.87–5.11)
RDW: 16.5 % — ABNORMAL HIGH (ref 11.5–15.5)
WBC: 4 10*3/uL (ref 4.0–10.5)
nRBC: 0 % (ref 0.0–0.2)

## 2023-11-30 LAB — COMPREHENSIVE METABOLIC PANEL
ALT: 65 U/L — ABNORMAL HIGH (ref 0–44)
AST: 15 U/L (ref 15–41)
Albumin: 2.3 g/dL — ABNORMAL LOW (ref 3.5–5.0)
Alkaline Phosphatase: 64 U/L (ref 38–126)
Anion gap: 6 (ref 5–15)
BUN: 35 mg/dL — ABNORMAL HIGH (ref 8–23)
CO2: 20 mmol/L — ABNORMAL LOW (ref 22–32)
Calcium: 8 mg/dL — ABNORMAL LOW (ref 8.9–10.3)
Chloride: 112 mmol/L — ABNORMAL HIGH (ref 98–111)
Creatinine, Ser: 1.22 mg/dL — ABNORMAL HIGH (ref 0.44–1.00)
GFR, Estimated: 43 mL/min — ABNORMAL LOW (ref >60)
Glucose, Bld: 115 mg/dL — ABNORMAL HIGH (ref 70–99)
Potassium: 4.3 mmol/L (ref 3.5–5.1)
Sodium: 138 mmol/L (ref 135–145)
Total Bilirubin: 0.7 mg/dL (ref 0.0–1.2)
Total Protein: 4.2 g/dL — ABNORMAL LOW (ref 6.5–8.1)

## 2023-11-30 MED ORDER — METOPROLOL TARTRATE 5 MG/5ML IV SOLN: 2.5000 mg | Freq: Four times a day (QID) | INTRAVENOUS | Status: AC | PRN

## 2023-11-30 NOTE — Progress Notes (Signed)
 Mobility Specialist - Progress Note  Pre-mobility: 100 bpm HR,  During mobility: 124 bpm HR,  Post-mobility: 94 bpm HR,     11/30/23 0917  Mobility  Activity Stood at bedside;Transferred from bed to chair  Level of Assistance Contact guard assist, steadying assist  Assistive Device Front wheel walker  Distance Ambulated (ft) 2 ft  Range of Motion/Exercises Active  Activity Response Tolerated fair  Mobility Referral Yes  Mobility visit 1 Mobility  Mobility Specialist Start Time (ACUTE ONLY) 0907  Mobility Specialist Stop Time (ACUTE ONLY) 0917  Mobility Specialist Time Calculation (min) (ACUTE ONLY) 10 min   Pt was found in bed and agreeable to ambulate. Upon standing stated feeling fatigued and returned to sit EOB. Pt agreeable to sitting on recliner chair. At EOS was left on recliner chair with all needs met. Call bell in reach and daughter in room.   Billey Chang Mobility Specialist

## 2023-11-30 NOTE — Assessment & Plan Note (Signed)
 With chronic stable angina - Hold Imdur, aspirin

## 2023-11-30 NOTE — Progress Notes (Signed)
 Pt called out at 23:40 on 11-29-23 stating she was having chest pain and the pain was traveling down left arm. Pt vitals were taken and she was stable. I gave Nitroglycerin tab and placed 2L [place] on patient. An EKG was preformed. Pt heart rate was in the 140s.  The patient felt instant relive. During time NP [facility] and another EKG was preformed and blood pressure checks. An order metoprolol was ordered.

## 2023-11-30 NOTE — Assessment & Plan Note (Signed)
 SLP noted normal oropharyngeal swallow.  In point of fact, her symptoms are in the chest after swallowing.  CT abdomen earlier this admission unremrkable - Awaiting barium esophagram

## 2023-11-30 NOTE — Assessment & Plan Note (Signed)
 Cr 3 on admission, treated with fluids and Cr improved to 1.2 today - Hold further fluids - Avoid chlorthalidone for now -Avoid hypotension, nephrotoxins

## 2023-11-30 NOTE — Assessment & Plan Note (Signed)
 Hard to know if her dyspnea/CP are partially related to OT obstruction. Cardiology were consulted and recommended against invasive testing, cMRI. - Consult Palliative Care

## 2023-11-30 NOTE — Assessment & Plan Note (Signed)
 Paroxysmal atrial fibrillation with RVR Cardiology were consulted, they recommended avoiding ablation, cardioversion, recommended rate control strategy.  Discussion of anticoagulation with family, this was declined - Continue diltiazem and Coreg

## 2023-11-30 NOTE — Plan of Care (Signed)

## 2023-11-30 NOTE — Progress Notes (Signed)
  Progress Note   Patient: [name] WGN:[id number] DOB: 1933/11/24 DOA: 11/21/2023     9 DOS: the patient was seen and examined on 11/30/2023 at 10:46 AM      Brief hospital course: 89 y.o. 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  Sent to ER from clinic due to creatinine 3.0.  In the hospital, noted to have Afib RVR.  Cardiology consulted.  Cr treated with fluids and now resolved to baseline.  Cardiology were consulted and recommended rate control strategy only due to complex advanced oncological disease.  Had persistent chest discomfort with swallowing, SLP noted this appeared to be esophageal.  Since then, awaiting SNF placement.     Assessment and Plan: Acute renal failure Cr 3 on admission, treated with fluids and Cr improved to 1.2 today - Hold further fluids - Avoid chlorthalidone for now -Avoid hypotension, nephrotoxins   Breast cancer with metastasis to spine and liver Verzenio currently on hold - Consult Oncology, Radiation oncology - Consult Palliative Care   Atrial fibrillation with RVR Cardiology were consulted, they recommended avoiding ablation, cardioversion, recommended rate control strategy.  Discussion of anticoagulation with family, this was declined - Continue diltiazem and Coreg  Hypertrophic cardiomyopathy Hard to know if her dyspnea/CP are partially related to OT obstruction. Cardiology were consulted and recommended against invasive testing, cMRI. - Consult Palliative Care  Dysphagia SLP noted normal oropharyngeal swallow.  In point of fact, her symptoms are in the chest after swallowing.  CT abdomen earlier this admission unremrkable - Awaiting barium esophagram   Hypertension Hypotension Blood pressure here has been relatively lower than baseline - Continue Coreg - Hold amlodipine, chlorthalidone, hydralazine, Imdur  Coronary artery disease with chronic angina -  Hold Imdur, aspirin  Transaminitis due to metastasis Slightly improved  Cancer related pain -Continue oxycodone as needed  Hypokalemia Resolved  Failure to thrive Likely malnutrition Weight down to 51 kg (from 56 kg 4 months ago, 7-8% loss) - Consult dietitian         Subjective: Patient has no new complaints, she is generally weak.  Tried to work with mobility today and could not stand.  Very tired.     Physical Exam: BP (!) 124/57 (BP Location: Right Arm)   Pulse 78   Temp 98.3 F (36.8 C) (Oral)   Resp 20   Ht 5\' 6"  (1.676 m)   Wt 53.5 kg   LMP 10/07/1974   SpO2 100%   BMI 19.04 kg/m   Thin frail adult female, sitting in recliner, tired, makes eye contact Irregular but mostly rate controlled, no murmurs, no peripheral edema Respiratory rate normal, lung sounds diminished but no rales or wheezes appreciated Attention normal, affect normal, judgment and insight appear normal, oriented to person, place, and time, overall weak and tired   Data Reviewed: Basic metabolic panel shows normal sodium and potassium, creatinine 1.2 LFTs show ALT down to 65, AST normal Hemoglobin 8.3 few days ago, platelets 73 a few days ago Echocardiogram shows septal hypertrophy    Family Communication: Daughter at the bedside    Disposition: Status is: Inpatient         Author: Alberteen Sam, MD 11/30/2023 1:08 PM  For on call review [email or web address].

## 2023-11-30 NOTE — Hospital Course (Signed)
 89 y.o. 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  Sent to ER from clinic due to creatinine 3.0.  In the hospital, noted to have Afib RVR.  Cardiology consulted.  Cr treated with fluids and now resolved to baseline.  Cardiology were consulted and recommended rate control strategy only due to complex advanced oncological disease.  Had persistent chest discomfort with swallowing, SLP noted this appeared to be esophageal.  Since then, awaiting SNF placement.

## 2023-11-30 NOTE — Assessment & Plan Note (Signed)
 Metastases to liver and spine Verzenio currently on hold - Consult Oncology, Radiation oncology - Consult Palliative Care

## 2023-11-30 NOTE — Assessment & Plan Note (Signed)
 Anemia Thrombocytopenia Discussed with hematology, this is likely due to her Verzenio, and probably bone marrow infiltration. - Supportive care

## 2023-11-30 NOTE — Assessment & Plan Note (Signed)
 Likely malnutrition Weight down to 51 kg (from 56 kg 4 months ago, 7-8% loss) - Consult dietitian

## 2023-11-30 NOTE — Assessment & Plan Note (Signed)
 Hypotension Blood pressure here has been relatively lower than baseline - Continue Coreg - Hold amlodipine, chlorthalidone, hydralazine, Imdur

## 2023-11-30 NOTE — Progress Notes (Signed)
 PMT no charge note.   Chart reviewed, note plans for Barium esophagogram, recommend SNF rehab with palliative once acute issues resolved, also undergoing radiation, monitor hospital course and PMT remains available for further GOC.  No charge.  Rosalin Hawking MD [place] [facility].

## 2023-12-01 ENCOUNTER — Ambulatory Visit: Payer: Medicare Other

## 2023-12-01 ENCOUNTER — Inpatient Hospital Stay (HOSPITAL_COMMUNITY): Payer: Medicare Other

## 2023-12-01 ENCOUNTER — Other Ambulatory Visit: Payer: Self-pay

## 2023-12-01 ENCOUNTER — Ambulatory Visit
Admission: RE | Admit: 2023-12-01 | Discharge: 2023-12-01 | Disposition: A | Discharge status: Home / Self Care | Payer: Medicare Other | Source: Ambulatory Visit | Attending: Radiation Oncology | Admitting: Radiation Oncology

## 2023-12-01 DIAGNOSIS — Z515 Encounter for palliative care: Secondary | ICD-10-CM | POA: Diagnosis not present

## 2023-12-01 DIAGNOSIS — R531 Weakness: Secondary | ICD-10-CM | POA: Diagnosis not present

## 2023-12-01 DIAGNOSIS — N179 Acute kidney failure, unspecified: Secondary | ICD-10-CM | POA: Diagnosis not present

## 2023-12-01 DIAGNOSIS — Z7189 Other specified counseling: Secondary | ICD-10-CM | POA: Diagnosis not present

## 2023-12-01 DIAGNOSIS — Z510 Encounter for antineoplastic radiation therapy: Secondary | ICD-10-CM | POA: Diagnosis not present

## 2023-12-01 DIAGNOSIS — C50412 Malignant neoplasm of upper-outer quadrant of left female breast: Secondary | ICD-10-CM | POA: Diagnosis not present

## 2023-12-01 DIAGNOSIS — C7951 Secondary malignant neoplasm of bone: Secondary | ICD-10-CM | POA: Diagnosis not present

## 2023-12-01 DIAGNOSIS — Z170 Estrogen receptor positive status [ER+]: Secondary | ICD-10-CM | POA: Diagnosis not present

## 2023-12-01 LAB — RAD ONC ARIA SESSION SUMMARY
Course Elapsed Days: 13
Plan Fractions Treated to Date: 8
Plan Prescribed Dose Per Fraction: 3 Gy
Plan Total Fractions Prescribed: 10
Plan Total Prescribed Dose: 30 Gy
Reference Point Dosage Given to Date: 24 Gy
Reference Point Session Dosage Given: 3 Gy
Session Number: 8

## 2023-12-01 LAB — CBC
HCT: 22.4 % — ABNORMAL LOW (ref 36.0–46.0)
Hemoglobin: 7.5 g/dL — ABNORMAL LOW (ref 12.0–15.0)
MCH: 33.9 pg (ref 26.0–34.0)
MCHC: 33.5 g/dL (ref 30.0–36.0)
MCV: 101.4 fL — ABNORMAL HIGH (ref 80.0–100.0)
Platelets: 68 10*3/uL — ABNORMAL LOW (ref 150–400)
RBC: 2.21 MIL/uL — ABNORMAL LOW (ref 3.87–5.11)
RDW: 16.6 % — ABNORMAL HIGH (ref 11.5–15.5)
WBC: 3.5 10*3/uL — ABNORMAL LOW (ref 4.0–10.5)
nRBC: 0 % (ref 0.0–0.2)

## 2023-12-01 LAB — BASIC METABOLIC PANEL
Anion gap: 6 (ref 5–15)
BUN: 38 mg/dL — ABNORMAL HIGH (ref 8–23)
CO2: 19 mmol/L — ABNORMAL LOW (ref 22–32)
Calcium: 7.7 mg/dL — ABNORMAL LOW (ref 8.9–10.3)
Chloride: 113 mmol/L — ABNORMAL HIGH (ref 98–111)
Creatinine, Ser: 1.36 mg/dL — ABNORMAL HIGH (ref 0.44–1.00)
GFR, Estimated: 38 mL/min — ABNORMAL LOW (ref >60)
Glucose, Bld: 108 mg/dL — ABNORMAL HIGH (ref 70–99)
Potassium: 3.8 mmol/L (ref 3.5–5.1)
Sodium: 138 mmol/L (ref 135–145)

## 2023-12-01 MED ORDER — SONAFINE EX EMUL
1.0000 | Freq: Two times a day (BID) | CUTANEOUS | Status: DC | PRN
Start: 2023-12-01 — End: 2023-12-04
  Administered 2023-12-01: 1 via TOPICAL

## 2023-12-01 MED ORDER — ORAL CARE MOUTH RINSE: 15.0000 mL | OROMUCOSAL | Status: DC | PRN

## 2023-12-01 NOTE — Progress Notes (Addendum)
 [name]   DOB:19-Jan-1934   BJ#:[id number]      ASSESSMENT & PLAN:  1.  Breast cancer with mets to liver, spine, peritoneum -Initially diagnosed with right-sided breast cancer in 2010.  She is status postlumpectomy, right breast RT and anastrozole which was completed in 2015. 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  Subsequently had a left breast lumpectomy with radioactive seed localization in 2020. -More recently, status post Faslodex and abemaciclib 2 weeks ago.  Now on hold.   - Consideration for continuing oncologic systemic therapy versus palliative care.  May resume therapy if she improves. - Encourage patient to stay out of bed and increase dietary intake.   - Radiation therapy x 10 fractions, tolerated well - Follows with medical oncology/Dr. Truett Perna.  Continue follow-up as outpatient upon discharge.   2.  Severe back pain/abdominal pain -Improving back pain, denies current abdominal pain - Admitted with severe back pain and abdominal pain. - Continue judicious pain management as needed - Continue PT   3.  Failure to thrive/dehydration Constipation - Admitted with failure to thrive, dehydration, and constipation. -Improving   4.  Pancytopenia - Likely due to Imaciclib, now on hold -CBC checked today WBC 3.5, Hgb 7.5, platelets 68K - Transfuse PRBC for Hgb <7.0.  No transfusional intervention warranted at this time. - Transfuse platelets for counts <20 K or <50 K with active bleeding.  No transfusional intervention warranted at this time.    - Will continue to monitor CBC with differential   5.  AKI - Creatinine stable 1.36 today - Avoid nephrotoxic agents - Continue gentle hydration   6.  History of ovarian CA - In 2009.  No recurrence to date.   7.  New onset A-fib -Elevated troponin - Seen by cardiology, not a candidate for [facility].      Code Status DNR-limited  Subjective:  Patient seen awake and alert sitting up in bed.  Patient's sister at  bedside.  Admits to slight back pain, denies abdominal pain.  Patient aware of discharge to SNF.  Agreeable to medical oncology follow-up.  Objective:  Vitals:   12/01/23 0344 12/01/23 0937  BP: (!) 123/52 125/64  Pulse: 83   Resp: 18   Temp: 97.8 F (36.6 C)   SpO2: 99%      Intake/Output Summary (Last 24 hours) at 12/01/2023 3086 Last data filed at 11/30/2023 1850 Gross per 24 hour  Intake 240 ml  Output --  Net 240 ml     REVIEW OF SYSTEMS:   Constitutional: +Fatigue +improved back pain Eyes: Denies blurriness of vision, double vision or watery eyes Ears, nose, mouth, throat, and face: Denies mucositis or sore throat Respiratory: Denies cough, dyspnea or wheezes Cardiovascular: Denies palpitation, chest discomfort or lower extremity swelling Gastrointestinal: +improved abdominal pain  skin: Denies abnormal skin rashes Lymphatics: Denies new lymphadenopathy or easy bruising Neurological: Denies numbness, tingling or new weaknesses Behavioral/Psych: Mood is stable, no new changes  All other systems were reviewed with the patient and are negative.  PHYSICAL EXAMINATION: ECOG PERFORMANCE STATUS: 2 - Symptomatic, <50% confined to bed  Vitals:   12/01/23 0344 12/01/23 0937  BP: (!) 123/52 125/64  Pulse: 83   Resp: 18   Temp: 97.8 F (36.6 C)   SpO2: 99%    Filed Weights   11/21/23 1525  Weight: 117 lb 15.1 oz (53.5 kg)    GENERAL: alert, no distress and comfortable SKIN: skin color, texture, turgor are normal, no rashes  or significant lesions EYES: normal, conjunctiva are pink and non-injected, sclera clear OROPHARYNX: no exudate, no erythema and lips, buccal mucosa, and tongue normal  NECK: supple, thyroid normal size, non-tender, without nodularity LYMPH: no palpable lymphadenopathy in the cervical, axillary or inguinal LUNGS: clear to auscultation and percussion with normal breathing effort HEART: regular rate & rhythm and no murmurs and no lower extremity  edema ABDOMEN: abdomen soft, non-tender and normal bowel sounds MUSCULOSKELETAL: no cyanosis of digits and no clubbing  PSYCH: alert & oriented x 3 with fluent speech NEURO: no focal motor/sensory deficits   All questions were answered. The patient knows to call the clinic with any problems, questions or concerns.   The total time spent in the appointment was 40 minutes encounter with patient including review of chart and various tests results, discussions about plan of care and coordination of care plan  Dawson Bills, NP 12/01/2023 9:51 AM    Labs Reviewed:  Lab Results  Component Value Date   WBC 3.5 (L) 12/01/2023   HGB 7.5 (L) 12/01/2023   HCT 22.4 (L) 12/01/2023   MCV 101.4 (H) 12/01/2023   PLT 68 (L) 12/01/2023   Recent Labs    11/24/23 0711 11/25/23 0520 11/27/23 0516 11/28/23 0514 11/29/23 0623 11/30/23 0537 12/01/23 0505  NA 139   < > 138   < > 138 138 138  K 2.9*   < > 3.0*   < > 3.2* 4.3 3.8  CL 114*   < > 110   < > 110 112* 113*  CO2 15*   < > 20*   < > 20* 20* 19*  GLUCOSE 116*   < > 136*   < > 131* 115* 108*  BUN 98*   < > 69*   < > 46* 35* 38*  CREATININE 2.15*   < > 1.78*   < > 1.46* 1.22* 1.36*  CALCIUM 8.0*   < > 7.9*   < > 7.8* 8.0* 7.7*  GFRNONAA 22*   < > 27*   < > 35* 43* 38*  PROT 4.2*  --  4.0*  --   --  4.2*  --   ALBUMIN 2.3*  --  2.3*  --   --  2.3*  --   AST 37  --  16  --   --  15  --   ALT 382*  --  131*  --   --  65*  --   ALKPHOS 111  --  76  --   --  64  --   BILITOT 0.9  --  0.8  --   --  0.7  --   BILIDIR  --   --  0.2  --   --   --   --   IBILI  --   --  0.6  --   --   --   --    < > = values in this interval not displayed.    Studies Reviewed:  DG ESOPHAGUS W SINGLE CM (SOL OR THIN BA) Result Date: 12/01/2023 CLINICAL DATA:  Dysphagia, pain during swallowing. EXAM: ESOPHOGRAM/BARIUM SWALLOW TECHNIQUE: Single contrast examination was performed using  thin barium. FLUOROSCOPY: Radiation Exposure Index (as provided by the  fluoroscopic device): 7.0 mGy Kerma COMPARISON:  None Available. FINDINGS: Limited assessment of swallowing due to kyphosis. Poor esophageal motility with stasis of contrast in the esophagus. Gastroesophageal junction does expand somewhat during swallowing but a 13 mm barium tablet would not  pass into the stomach. IMPRESSION: 1. Slight narrowing at the gastroesophageal junction, through which a 13 mm barium tablet would not pass. 2. Esophageal dysmotility. Electronically Signed   By: Leanna Battles M.D.   On: 12/01/2023 09:00   ECHOCARDIOGRAM COMPLETE Result Date: 11/25/2023    ECHOCARDIOGRAM REPORT   Patient Name:   BARBY COLVARD Date of Exam: 11/25/2023 Medical Rec #:  [id number]        Height:       66.0 in Accession #:    [id number]       Weight:       117.9 lb Date of Birth:  [birth year]       BSA:          1.598 m Patient Age:    89 years         BP:           144/56 mmHg Patient Gender: F                HR:           88 bpm. Exam Location:  Inpatient Procedure: 2D Echo, Cardiac Doppler and Color Doppler (Both Spectral and Color            Flow Doppler were utilized during procedure). Indications:    Chest Pain R07.9  History:        Patient has prior history of Echocardiogram examinations, most                 recent 03/22/2022. CAD; Risk Factors:Hypertension.  Sonographer:    Webb Laws Referring Phys: [id number] SUNIT TOLIA IMPRESSIONS  1. Severe basal septal hypertrophy on off axis imaging; maximal septal thickness 20 mm. Maximal apical hypertrophy 20 mm. No resting gradient noted. Left ventricular ejection fraction, by estimation, is 65 to 70%. The left ventricle has normal function.  The left ventricle has no regional wall motion abnormalities. There is severe asymmetric left ventricular hypertrophy of the basal-septal segment. Left ventricular diastolic function could not be evaluated.  2. Right ventricular systolic function is normal. The right ventricular size is normal.  3. Left atrial size  was mildly dilated.  4. The mitral valve is normal in structure. No evidence of mitral valve regurgitation. No evidence of mitral stenosis. The mean mitral valve gradient is 2.0 mmHg. Moderate mitral annular calcification.  5. The aortic valve is tricuspid. There is mild calcification of the aortic valve. Aortic valve regurgitation is mild. No aortic stenosis is present.  6. The inferior vena cava is normal in size with greater than 50% respiratory variability, suggesting right atrial pressure of 3 mmHg. Comparison(s): Prior images reviewed side by side. Aortic regurgitation not seen in 2023 study. Maximal basal septal thickness 14 mm on better quality long axis view. Apex not well seen in 2023 study. Conclusion(s)/Recommendation(s): Consider outpatient CMR for clarification of hypertrophy if clinically indicated. FINDINGS  Left Ventricle: Severe basal septal hypertrophy on off axis imaging; maximal septal thickness 20 mm. Maximal apical hypertrophy 20 mm. No resting gradient noted. Left ventricular ejection fraction, by estimation, is 65 to 70%. The left ventricle has normal function. The left ventricle has no regional wall motion abnormalities. Global longitudinal strain performed but not reported based on interpreter judgement due to suboptimal tracking. The left ventricular internal cavity size was normal in size. There is severe asymmetric left ventricular hypertrophy of the basal-septal segment. Left ventricular diastolic function could not be evaluated due to mitral annular calcification (moderate or greater). Left ventricular  diastolic function could not be evaluated. Right Ventricle: The right ventricular size is normal. No increase in right ventricular wall thickness. Right ventricular systolic function is normal. Left Atrium: Left atrial size was mildly dilated. Right Atrium: Right atrial size was normal in size. Pericardium: There is no evidence of pericardial effusion. Mitral Valve: The mitral valve is  normal in structure. Moderate mitral annular calcification. No evidence of mitral valve regurgitation. No evidence of mitral valve stenosis. The mean mitral valve gradient is 2.0 mmHg. Tricuspid Valve: The tricuspid valve is normal in structure. Tricuspid valve regurgitation is mild . No evidence of tricuspid stenosis. Aortic Valve: The aortic valve is tricuspid. There is mild calcification of the aortic valve. Aortic valve regurgitation is mild. No aortic stenosis is present. Pulmonic Valve: The pulmonic valve was not well visualized. Pulmonic valve regurgitation is not visualized. No evidence of pulmonic stenosis. Aorta: The aortic root and ascending aorta are structurally normal, with no evidence of dilitation. Venous: The inferior vena cava is normal in size with greater than 50% respiratory variability, suggesting right atrial pressure of 3 mmHg. IAS/Shunts: The atrial septum is grossly normal. Additional Comments: 3D imaging was not performed.  LEFT VENTRICLE PLAX 2D LVIDd:         4.00 cm   Diastology LVIDs:         3.20 cm   LV e' medial:    5.11 cm/s LV PW:         1.25 cm   LV E/e' medial:  21.7 LV IVS:        1.10 cm   LV e' lateral:   9.03 cm/s LVOT diam:     1.70 cm   LV E/e' lateral: 12.3 LV SV:         51 LV SV Index:   32 LVOT Area:     2.27 cm  RIGHT VENTRICLE             IVC RV Basal diam:  3.00 cm     IVC diam: 1.40 cm RV S prime:     11.60 cm/s TAPSE (M-mode): 1.5 cm LEFT ATRIUM             Index        RIGHT ATRIUM          Index LA diam:        4.50 cm 2.82 cm/m   RA Area:     7.16 cm LA Vol (A2C):   54.4 ml 34.05 ml/m  RA Volume:   13.10 ml 8.20 ml/m LA Vol (A4C):   47.2 ml 29.54 ml/m LA Biplane Vol: 54.4 ml 34.05 ml/m  AORTIC VALVE LVOT Vmax:   145.50 cm/s LVOT Vmean:  89.250 cm/s LVOT VTI:    0.224 m  AORTA Ao Root diam: 2.70 cm Ao Asc diam:  3.10 cm MITRAL VALVE                TRICUSPID VALVE MV Area (PHT): 4.15 cm     TR Peak grad:   25.0 mmHg MV Mean grad:  2.0 mmHg     TR Vmax:         250.00 cm/s MV Decel Time: 183 msec MV E velocity: 111.00 cm/s  SHUNTS                             Systemic VTI:  0.22 m  Systemic Diam: 1.70 cm Riley Lam MD Electronically signed by Riley Lam MD Signature Date/Time: 11/25/2023/9:28:52 AM    Final    CT ABDOMEN PELVIS WO CONTRAST Result Date: 11/23/2023 CLINICAL DATA:  Metastatic breast cancer. Acute abdominal pain. Acute kidney injury. * Tracking Code: BO * EXAM: CT ABDOMEN AND PELVIS WITHOUT CONTRAST TECHNIQUE: Multidetector CT imaging of the abdomen and pelvis was performed following the standard protocol without IV contrast. RADIATION DOSE REDUCTION: This exam was performed according to the departmental dose-optimization program which includes automated exposure control, adjustment of the mA and/or kV according to patient size and/or use of iterative reconstruction technique. COMPARISON:  10/16/2023 FINDINGS: Lower chest: Right coronary artery descending thoracic aortic atheromatous vascular calcification. Trace pericardial effusion. Mitral valve calcification. Small bilateral pleural effusions with passive atelectasis. Small type 1 hiatal hernia. Scattered small pulmonary nodules in the lung bases as on 10/16/2023, probably from metastatic disease. Hepatobiliary: Scattered hepatic hypodense lesions compatible with metastatic disease. A confluence of two lesions posteriorly in the right hepatic lobe measures 4.1 by 2.2 cm on image 8 series 2. A medial hypodense lesion posteriorly in the right hepatic lobe measures 4.4 by 3.1 cm on image 10 series 2, formerly about 4.0 by 2.7 cm. Cholecystectomy. Distal common bile duct proximally 9 mm in diameter, borderline prominent for age. Pancreas: Atrophic, otherwise unremarkable. Spleen: Unremarkable Adrenals/Urinary Tract: 2.0 by 2.0 cm right adrenal mass, internal density 34 Hounsfield units which is indeterminate. Bilateral nonobstructive renal calculi. Mild right  hydronephrosis and proximal hydroureter extending to the iliac vessel cross over, however with no distal hydroureter. Foley catheter in the urinary bladder which is mostly empty although there is a small amount of gas in the lumen of the urinary bladder. Stomach/Bowel: Small periampullary duodenal diverticulum. Large caliber distal colon and rectum containing progressive dilution of contrast medium as well as stool contents. Prior bowel resections include the right colon and at least part of the ileum. Vascular/Lymphatic: Atherosclerosis is present, including aortoiliac atherosclerotic disease. Reproductive: Uterus absent.  Adnexa unremarkable. Other: No supplemental non-categorized findings. Musculoskeletal: We partially include what appears to be a compression fracture and possible lytic lesions involving the T11 vertebral body. Suspected right eleventh rib fracture medially. Lucency eccentric to the left in the T12 vertebral body may be from metastatic disease. Bony destructive findings in the posterior elements at L4 suspicious for osseous metastatic disease. Grade 1 anterolisthesis at L5-S1. Lucency in the left iliac wing on image 53 series 4 is probably a metastatic lesion. There is also lucency further medially in the left iliac bone on image 77 series 4. IMPRESSION: 1. Mild right hydronephrosis and proximal hydroureter extending to the iliac vessel cross over, however with no distal hydroureter. This could be from a distal ureteral stricture or from a recently passed calculus. 2. Bilateral nonobstructive renal calculi. 3. Scattered hepatic hypodense lesions compatible with metastatic disease. 4. Scattered small pulmonary nodules in the lung bases as on 10/16/2023, probably from metastatic disease. 5. 2.0 by 2.0 cm right adrenal mass, internal density 34 Hounsfield units which is indeterminate but quite likely metastatic. 6. We partially include what appears to be a compression fracture and possible lytic  lesions involving the T11 vertebral body. Suspected right eleventh rib fracture medially. Bony destructive findings in the posterior elements at L4 suspicious for osseous metastatic disease. Left iliac lytic lesions suspicious for metastatic disease. 7. Small bilateral pleural effusions with passive atelectasis. 8. Small type 1 hiatal hernia. 9. Aortic and coronary atherosclerosis. Aortic Atherosclerosis (ICD10-I70.0). Electronically  Signed   By: Gaylyn Rong M.D.   On: 11/23/2023 17:51   US RENAL Result Date: 11/21/2023 CLINICAL DATA:  Acute kidney injury. EXAM: RENAL / URINARY TRACT ULTRASOUND COMPLETE COMPARISON:  CT 10/16/2023 FINDINGS: Right Kidney: Renal measurements: 10.1 x 4.4 x 4.7 cm = volume: 110 mL. Moderate hydronephrosis is new from prior CT. Mild increased renal parenchymal echogenicity typical of chronic renal disease. Renal calculi on prior CT not well-defined on the current exam. No evidence of focal lesion. Left Kidney: Renal measurements: 9 x 4.1 x 4.3 cm = volume: 83 mL. No hydronephrosis. Thinning of renal parenchyma with increased echogenicity. Shadowing 9 mm stone. Small renal cysts, largest 1.7 cm. No evidence of suspicious lesion. Bladder: Appears normal for degree of bladder distention. Other: None. IMPRESSION: 1. Moderate right hydronephrosis is new from prior CT. 2. Increased renal parenchymal echogenicity typical of chronic medical renal disease. 3. Left renal stone. Known right renal calculi on prior CT not well seen. Electronically Signed   By: Narda Rutherford M.D.   On: 11/21/2023 22:06   Ms. Seat was interviewed and examined.  Her clinical status appears improved.  The elevated liver enzymes and renal function have improved.  She has persistent anemia/thrombocytopenia, likely secondary to metastatic carcinoma involving the bone marrow and Abemaciclib.  Abemaciclib may also have caused the acute renal failure and elevated liver enzymes.  Her pain appears much  improved.  She is currently not taking pain medication.  She is completing palliative radiation to the spine.  Recommendations: Complete palliative spine radiation Plan to hold Abemaciclib and resume Faslodex SNF, PT/OT

## 2023-12-01 NOTE — Plan of Care (Signed)

## 2023-12-01 NOTE — [place] FL2 (Signed)
 [place] MEDICAID FL2 LEVEL OF CARE FORM     IDENTIFICATION  Patient Name: [name] Birthdate: [birth year] Sex: female Admission Date (Current Location): 11/21/2023  [facility] and [place] Number:  Producer, television/film/video and Address:  [facility],  [address] [place], [place] [address]      Provider Number: [id number]  Attending Physician Name and Address:  Alberteen Sam, *  Relative Name and Phone Number:  Eber Jones [id number]([phone]    Current Level of Care: Hospital Recommended Level of Care: Skilled Nursing Facility Prior Approval Number:    Date Approved/Denied:   PASRR Number: [id number]  Discharge Plan: SNF    Current Diagnoses: Patient Active Problem List   Diagnosis Date Noted   Failure to thrive 11/30/2023   Dysphagia 11/30/2023   Bicytopenia 11/27/2023   Hypokalemia 11/27/2023   New onset a-fib (HCC) 11/24/2023   Hypertrophic cardiomyopathy (HCC) 11/24/2023   Demand ischemia (HCC) 11/24/2023   Anemia 11/24/2023   Malignant neoplasm of ovary (HCC) 11/24/2023   AKI (acute kidney injury) (HCC) 11/21/2023   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) 11/12/2023   Infection of hand 02/02/2022   Genetic testing 07/20/2019   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/20/2019   Family history of breast cancer    Carcinoma of upper-outer quadrant of left breast in female, estrogen receptor positive (HCC) 05/19/2019   Left carotid bruit 01/21/2017   Hypertension    Hyperlipidemia    GERD (gastroesophageal reflux disease)    Ureteral calculi    History of breast cancer    History of ovarian cancer    History of skin cancer    Frequency of urination    Dysuria    Kidney stones    CAD (coronary artery disease)    Ductal carcinoma (HCC) 08/11/2013   Serous tumor, 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    Orientation RESPIRATION BLADDER Height & Weight     Self, Time, Situation, Place  Normal Continent Weight: 53.5  kg Height:  5\' 6"  (167.6 cm)  BEHAVIORAL SYMPTOMS/MOOD NEUROLOGICAL BOWEL NUTRITION STATUS      Incontinent Diet (Regular)  AMBULATORY STATUS COMMUNICATION OF NEEDS Skin   Limited Assist   Normal                       Personal Care Assistance Level of Assistance  Bathing, Feeding, Dressing Bathing Assistance: Limited assistance Feeding assistance: Limited assistance Dressing Assistance: Limited assistance     Functional Limitations Info  Sight, Hearing, Speech Sight Info: Impaired (readers) Hearing Info: Adequate Speech Info: Adequate    SPECIAL CARE FACTORS FREQUENCY  PT (By licensed PT), OT (By licensed OT)     PT Frequency: 5x week OT Frequency: 5x week            Contractures Contractures Info: Not present    Additional Factors Info  Code Status, Allergies Code Status Info: DNR Allergies Info: Ramipril           Current Medications (12/01/2023):  This is the current hospital active medication list Current Facility-Administered Medications  Medication Dose Route Frequency Provider Last Rate Last Admin   acetaminophen (TYLENOL) tablet 650 mg  650 mg Oral Q6H PRN Kirby Crigler, Mir M, MD   650 mg at 11/25/23 0900   Or   acetaminophen (TYLENOL) suppository 650 mg  650 mg Rectal Q6H PRN Kirby Crigler, Mir M, MD       albuterol (PROVENTIL) (2.5 MG/3ML) 0.083% nebulizer  solution 2.5 mg  2.5 mg Nebulization Q2H PRN Kirby Crigler, Mir M, MD       bisacodyl (DULCOLAX) suppository 10 mg  10 mg Rectal Daily PRN Kirby Crigler, Mir M, MD   10 mg at 11/21/23 1532   calcium carbonate (TUMS - dosed in mg elemental calcium) chewable tablet 400 mg of elemental calcium  400 mg of elemental calcium Oral TID PRN Lurene Shadow, MD       carvedilol (COREG) tablet 25 mg  25 mg Oral BID WC Tolia, Sunit, DO   25 mg at 12/01/23 1610   Chlorhexidine Gluconate Cloth 2 % PADS 6 each  6 each Topical Daily Meredeth Ide, MD   6 each at 11/28/23 1030   diltiazem (CARDIZEM SR) 12 hr capsule 90 mg  90  mg Oral Q12H Dunn, Dayna N, PA-C   90 mg at 12/01/23 9604   famotidine (PEPCID) tablet 20 mg  20 mg Oral Daily Leeroy Bock, MD   20 mg at 12/01/23 5409   feeding supplement (ENSURE ENLIVE / ENSURE PLUS) liquid 237 mL  237 mL Oral BID BM Meredeth Ide, MD   237 mL at 11/30/23 1705   HYDROmorphone (DILAUDID) injection 0.5-1 mg  0.5-1 mg Intravenous Q2H PRN Kirby Crigler, Mir M, MD   1 mg at 11/27/23 1146   iohexol (OMNIPAQUE) 300 MG/ML solution 30 mL  30 mL Oral Once PRN Meredeth Ide, MD       lidocaine (XYLOCAINE) 2 % viscous mouth solution 15 mL  15 mL Mouth/Throat Q3H PRN Kirby Crigler, Mir M, MD       liver oil-zinc oxide (DESITIN) 40 % ointment   Topical QID PRN Murriel Hopper, RN   Given at 11/30/23 2318   loperamide (IMODIUM) capsule 2 mg  2 mg Oral PRN Lurene Shadow, MD   2 mg at 11/30/23 2307   morphine (PF) 2 MG/ML injection 1 mg  1 mg Intravenous Q4H PRN Meredeth Ide, MD   1 mg at 11/24/23 0859   nitroGLYCERIN (NITROSTAT) SL tablet 0.4 mg  0.4 mg Sublingual Q5 min PRN Thornton Papas B, MD   0.4 mg at 11/29/23 2341   ondansetron (ZOFRAN) tablet 4 mg  4 mg Oral Q6H PRN Kirby Crigler, Mir M, MD       Or   ondansetron [facility]) injection 4 mg  4 mg Intravenous Q6H PRN Kirby Crigler, Mir M, MD       Oral care mouth rinse  15 mL Mouth Rinse PRN Danford, Earl Lites, MD       oxyCODONE (Oxy IR/ROXICODONE) immediate release tablet 5 mg  5 mg Oral Q4H PRN Kirby Crigler, Mir M, MD   5 mg at 11/24/23 1213   senna (SENOKOT) tablet 8.6 mg  1 tablet Oral Daily PRN Leeroy Bock, MD       traZODone (DESYREL) tablet 50 mg  50 mg Oral QHS PRN Maryln Gottron, MD   50 mg at 11/30/23 2158     Discharge Medications: Please see discharge summary for a list of discharge medications.  Relevant Imaging Results:  Relevant Lab Results:   Additional Information ss#[id number]  Jamariya Davidoff, Olegario Messier, [place]

## 2023-12-01 NOTE — Progress Notes (Signed)
  Progress Note   Patient: [name] [id number] DOB: October 09, 1933 DOA: 11/21/2023     10 DOS: the patient was seen and examined on 12/01/2023 at 12:17 PM      Brief hospital course: 89 y.o. 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        Assessment and Plan: * AKI (acute kidney injury) (HCC) Creatinine stable at 1.3 - Hold chlorthalidone - Avoid hypotension, nephrotoxins  Dysphagia due to esophageal dysmotility and esophageal stricture Patient complained of discomfort with swallowing since admission.  This has been gradually progressive over several months, considerably worse in the hospital.  Evaluated by SLP, who noted no oropharyngeal dysfunction.  Underwent barium esophagram that showed esophageal dysmotility, and distal stricture.  Discussed with GI, but given her platelets 68K, EGD and dilation would be excessively risky. 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 - Supportive care  Hypertrophic cardiomyopathy (HCC) Hard to know if her dyspnea/CP are partially related to OT obstruction. Cardiology were consulted and recommended against invasive testing, cMRI. - Consult Palliative Care  New onset a-fib [facility]) Paroxysmal atrial fibrillation with RVR Cardiology were consulted, they recommended avoiding ablation, cardioversion, recommended rate control strategy.  Discussion of anticoagulation with family, this was declined - Continue diltiazem and Coreg  Carcinoma of upper-outer quadrant of left breast  in female, estrogen receptor positive (HCC) Metastases to liver and spine Verzenio currently on hold - Consult Oncology, Radiation oncology - Consult Palliative Care  CAD (coronary artery disease) With chronic stable angina - Hold Imdur, aspirin  Hypertension Hypotension Blood pressure here has been relatively lower than baseline - Continue Coreg, diltiazem - Hold amlodipine, chlorthalidone, hydralazine, Imdur          Subjective: No clinical change.  She still has discomfort with swallowing.  She has had no fever, cough.  She still extremely weak.     Physical Exam: BP 126/64 (BP Location: Right Arm)   Pulse 92   Temp 98.3 F (36.8 C) (Oral)   Resp 18   Ht 5\' 6"  (1.676 m)   Wt 53.5 kg   LMP 10/07/1974   SpO2 100%   BMI 19.04 kg/m   Frail elderly female, lying in bed, interactive and appropriate RRR, no murmurs, no peripheral edema Respiratory normal, lungs clear without rales or wheezes Abdomen soft no tenderness palpation or guarding Attention normal, affect blunted, judgment and insight appear normal, face symmetric, severe generalized weakness  Data Reviewed: Discussed with oncology Basic metabolic panel shows creatinine stable at 1.3 CBC shows some slightly worsened pancytopenia, hemoglobin down to 7.5  Family Communication: Sister at the bedside    Disposition: Status is: Inpatient         Author: Alberteen Sam, MD 12/01/2023 2:51 PM  For on call review [email or web address].

## 2023-12-01 NOTE — TOC Progression Note (Signed)
 Transition of Care [facility]) - Progression Note    Patient Details  Name: [name] MRN: [id number] Date of Birth: 01/24/1934  [facility] CM/SW Contact  Cozetta Seif, Olegario Messier, RN Phone Number: 12/01/2023, 11:22 AM  Clinical Narrative:  Patient/dtr Eber Jones in agreement to ST SNF-faxed out await bed offers, & choice. Email offers to dtr Carolyn:[email or web address]    Expected Discharge Plan: Skilled Nursing Facility Barriers to Discharge: Continued Medical Work up  Expected Discharge Plan and Services   Discharge Planning Services: CM Consult Post Acute Care Choice: Skilled Nursing Facility Living arrangements for the past 2 months: Single Family Home                                       Social Determinants of Health (SDOH) Interventions SDOH Screenings   Food Insecurity: No Food Insecurity (11/21/2023)  Housing: Low Risk  (11/21/2023)  Transportation Needs: No Transportation Needs (11/21/2023)  Utilities: Not At Risk (11/21/2023)  Depression (PHQ2-9): Low Risk  (11/11/2023)  Social Connections: Moderately Integrated (11/21/2023)  Tobacco Use: Low Risk  (11/21/2023)    Readmission Risk Interventions    11/23/2023   11:56 AM  Readmission Risk Prevention Plan  Transportation Screening Complete  PCP or Specialist Appt within 5-7 Days Complete  Home Care Screening Complete  Medication Review (RN CM) Complete

## 2023-12-01 NOTE — Progress Notes (Signed)
 Daily Progress Note   Patient Name: [name]       Date: 12/01/2023 DOB: 03-May-1934  Age: 89 y.o. MRN#: [id number] Attending Physician: Alberteen Sam, * Primary Care Physician: Lorenda Ishihara, MD Admit Date: 11/21/2023  Reason for Consultation/Follow-up: Establishing goals of care  Subjective: Sitting up in bed, sister [name] from [facility] is at bedside, Dr Maryfrances Bunnell also in the room for part of the visit, esophagogram results noted.  Discussed with patient and sister about SNF rehab with palliative in light of current hospital course.    Length of Stay: 10  Current Medications: Scheduled Meds:   carvedilol  25 mg Oral BID WC   Chlorhexidine Gluconate Cloth  6 each Topical Daily   diltiazem  90 mg Oral Q12H   famotidine  20 mg Oral Daily   feeding supplement  237 mL Oral BID BM    Continuous Infusions:   PRN Meds: acetaminophen **OR** acetaminophen, albuterol, bisacodyl, calcium carbonate, HYDROmorphone (DILAUDID) injection, iohexol, lidocaine, liver oil-zinc oxide, loperamide, morphine injection, nitroGLYCERIN, ondansetron **OR** ondansetron (ZOFRAN) IV, mouth rinse, oxyCODONE, senna, traZODone  Physical Exam         Awake alert No distress Regular work of breathing Appears chronically ill  Vital Signs: BP 126/64 (BP Location: Right Arm)   Pulse 92   Temp 98.3 F (36.8 C) (Oral)   Resp 18   Ht 5\' 6"  (1.676 m)   Wt 53.5 kg   LMP 10/07/1974   SpO2 100%   BMI 19.04 kg/m  SpO2: SpO2: 100 % O2 Device: O2 Device: Room Air O2 Flow Rate: O2 Flow Rate (L/min): 2 L/min  Intake/output summary:  Intake/Output Summary (Last 24 hours) at 12/01/2023 1305 Last data filed at 11/30/2023 1850 Gross per 24 hour  Intake 120 ml  Output --  Net 120 ml    LBM: Last BM Date : 12/01/23 Baseline Weight: Weight: 53.5 kg Most recent weight: Weight: 53.5 kg       Palliative Assessment/Data:      Patient Active Problem List   Diagnosis Date Noted   Failure to thrive 11/30/2023   Dysphagia 11/30/2023   Bicytopenia 11/27/2023   Hypokalemia 11/27/2023   New onset a-fib (HCC) 11/24/2023   Hypertrophic cardiomyopathy (HCC) 11/24/2023   Demand ischemia (HCC) 11/24/2023   Anemia 11/24/2023  Malignant neoplasm of ovary (HCC) 11/24/2023   AKI (acute kidney injury) (HCC) 11/21/2023   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) 11/12/2023   Infection of hand 02/02/2022   Genetic testing 07/20/2019   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/20/2019   Family history of breast cancer    Carcinoma of upper-outer quadrant of left breast in female, estrogen receptor positive (HCC) 05/19/2019   Left carotid bruit 01/21/2017   Hypertension    Hyperlipidemia    GERD (gastroesophageal reflux disease)    Ureteral calculi    History of breast cancer    History of ovarian cancer    History of skin cancer    Frequency of urination    Dysuria    Kidney stones    CAD (coronary artery disease)    Ductal carcinoma (HCC) 08/11/2013   Serous tumor, 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   Recommendations/Plan:  Continue current pain and non pain regimen Recommend SNF rehab with palliative Recommend outpatient palliative care through my colleague Ms Cousar NP at [facility].     Code Status:    Code Status Orders  (From admission, onward)           Start     Ordered   11/21/23 1502  Do not attempt resuscitation (DNR)- Limited -Do Not Intubate (DNI)  Continuous       Question Answer Comment  If pulseless  and not breathing No CPR or chest compressions.   In Pre-Arrest Conditions (Patient Is Breathing and Has A Pulse) Do not intubate. Provide all appropriate non-invasive medical interventions. Avoid ICU transfer unless indicated or required.   Consent: Discussion documented in EHR or advanced directives reviewed      11/21/23 1503           Code Status History     Date Active Date Inactive Code Status Order ID Comments User Context   10/30/2023 1130 10/31/2023 0511 Full Code [id number]  Irish Lack, MD [facility]   02/02/2022 0215 02/03/2022 1625 Full Code [id number]  Hillary Bow, DO ED      Advance Directive Documentation    Flowsheet Row Most Recent Value  Type of Advance Directive Healthcare Power of Attorney  Pre-existing out of facility DNR order (yellow form or pink MOST form) --  "MOST" Form in Place? --       Prognosis:  Unable to determine  Discharge Planning: Skilled Nursing Facility for rehab with Palliative care service follow-up   Recommended outpatient palliative care.  Care plan was discussed with  patient and sister.   Thank you for allowing the Palliative Medicine Team to assist in the care of this patient.  Mod MDM     Greater than 50%  of this time was spent counseling and coordinating care related to the above assessment and plan.  Rosalin Hawking, MD  Please contact Palliative Medicine Team phone at [phone] for questions and concerns.

## 2023-12-01 NOTE — Progress Notes (Signed)
 Physical Therapy Treatment Patient Details Name: [name] MRN: [id number] DOB: Sep 30, 1934 Today's Date: 12/01/2023   History of Present Illness 89 y.o. female admitted to the hospital on 11/21/23 with intractable pain and acute renal failure. 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 She began palliative radiation to her spine 11/18/2023    PT Comments  Pt agreeable to working with therapy. Min A for safe mobility on today. Pt is quite weak compared to last PT session. She reports feeling weak. She feels she needs an increased level of care at this time that may be too much for her to d/c home. She is agreeable to ST rehab. Patient will benefit from continued inpatient follow up therapy, <3 hours/day     If plan is discharge home, recommend the following: A little help with walking and/or transfers;A little help with bathing/dressing/bathroom;Assistance with cooking/housework;Help with stairs or ramp for entrance;Assist for transportation   Can travel by private vehicle        Equipment Recommendations       Recommendations for Other Services       Precautions / Restrictions Precautions Precautions: Fall Precaution/Restrictions Comments: incontinent Restrictions Weight Bearing Restrictions Per Provider Order: No     Mobility  Bed Mobility Overal bed mobility: Needs Assistance Bed Mobility: Supine to Sit, Sit to Supine     Supine to sit: Supervision, HOB elevated, Used rails Sit to supine: Supervision, HOB elevated, Used rails   General bed mobility comments: Supv for safety. Increased time.    Transfers Overall transfer level: Needs assistance Equipment used: Rolling walker (2 wheels) Transfers: Sit to/from Stand, Bed to chair/wheelchair/BSC Sit to Stand: Min assist   Step pivot transfers: Min assist       General transfer comment: Assist to steady pt and manage RW.  Cues for safety.    Ambulation/Gait Ambulation/Gait assistance: Min assist Gait Distance (Feet): 15 Feet Assistive device: Rolling walker (2 wheels) Gait Pattern/deviations: Step-through pattern, Decreased stride length, Trunk flexed       General Gait Details: Assist to stabilize pt and manage RW throughout distance. Pt fatigues very quickly/easily. Fall risk. Pt unable to ambulate any further due to weakness, fatigue.    Stairs             Wheelchair Mobility     Tilt Bed    Modified Rankin (Stroke Patients Only)       Balance Overall balance assessment: Needs assistance         Standing balance support: Bilateral upper extremity supported, Reliant on assistive device for balance Standing balance-Leahy Scale: Poor                              Communication Communication Communication: No apparent difficulties  Cognition Arousal: Alert Behavior During Therapy: WFL for tasks assessed/performed, Flat affect   PT - Cognitive impairments: No apparent impairments                         Following commands: Intact      Cueing    Exercises      General Comments        Pertinent Vitals/Pain Pain Assessment Pain Assessment: Faces Faces Pain Scale: Hurts little more Pain Location: back Pain Descriptors / Indicators: Discomfort Pain Intervention(s): Limited activity within patient's tolerance, Monitored during session, Repositioned    Home Living  Prior Function            PT Goals (current goals can now be found in the care plan section) Progress towards PT goals: Progressing toward goals    Frequency    Min 1X/week      PT Plan      Co-evaluation              AM-PAC PT "6 Clicks" Mobility   Outcome Measure  Help needed turning from your back to your side while in a flat bed without using bedrails?: A Little Help needed moving from lying on your back to sitting on the  side of a flat bed without using bedrails?: A Little Help needed moving to and from a bed to a chair (including a wheelchair)?: A Little Help needed standing up from a chair using your arms (e.g., wheelchair or bedside chair)?: A Little Help needed to walk in hospital room?: A Little Help needed climbing 3-5 steps with a railing? : Total 6 Click Score: 16    End of Session Equipment Utilized During Treatment: Gait belt Activity Tolerance: Patient limited by fatigue Patient left: in bed;with call bell/phone within reach;with bed alarm set   PT Visit Diagnosis: Difficulty in walking, not elsewhere classified (R26.2);Adult, failure to thrive (R62.7)     Time: 6644-0347 PT Time Calculation (min) (ACUTE ONLY): 18 min  Charges:    $Gait Training: 8-22 mins PT General Charges $$ ACUTE PT VISIT: 1 Visit                         Faye Ramsay, PT Acute Rehabilitation  Office: [phone]

## 2023-12-02 ENCOUNTER — Other Ambulatory Visit: Payer: Self-pay

## 2023-12-02 ENCOUNTER — Ambulatory Visit
Admission: RE | Admit: 2023-12-02 | Discharge: 2023-12-02 | Disposition: A | Discharge status: Home / Self Care | Payer: Medicare Other | Source: Ambulatory Visit | Attending: Radiation Oncology

## 2023-12-02 ENCOUNTER — Ambulatory Visit: Payer: Medicare Other

## 2023-12-02 ENCOUNTER — Other Ambulatory Visit (HOSPITAL_COMMUNITY): Payer: Self-pay

## 2023-12-02 DIAGNOSIS — R1310 Dysphagia, unspecified: Secondary | ICD-10-CM

## 2023-12-02 DIAGNOSIS — R627 Adult failure to thrive: Secondary | ICD-10-CM | POA: Diagnosis not present

## 2023-12-02 DIAGNOSIS — C50912 Malignant neoplasm of unspecified site of left female breast: Secondary | ICD-10-CM | POA: Diagnosis not present

## 2023-12-02 DIAGNOSIS — Z515 Encounter for palliative care: Secondary | ICD-10-CM

## 2023-12-02 DIAGNOSIS — C50412 Malignant neoplasm of upper-outer quadrant of left female breast: Secondary | ICD-10-CM | POA: Diagnosis not present

## 2023-12-02 DIAGNOSIS — Z170 Estrogen receptor positive status [ER+]: Secondary | ICD-10-CM | POA: Diagnosis not present

## 2023-12-02 DIAGNOSIS — C7951 Secondary malignant neoplasm of bone: Secondary | ICD-10-CM | POA: Diagnosis not present

## 2023-12-02 DIAGNOSIS — N179 Acute kidney failure, unspecified: Secondary | ICD-10-CM | POA: Diagnosis not present

## 2023-12-02 DIAGNOSIS — E43 Unspecified severe protein-calorie malnutrition: Secondary | ICD-10-CM | POA: Insufficient documentation

## 2023-12-02 DIAGNOSIS — Z510 Encounter for antineoplastic radiation therapy: Secondary | ICD-10-CM | POA: Diagnosis not present

## 2023-12-02 DIAGNOSIS — Z7189 Other specified counseling: Secondary | ICD-10-CM

## 2023-12-02 LAB — CBC
HCT: 24.8 % — ABNORMAL LOW (ref 36.0–46.0)
Hemoglobin: 8 g/dL — ABNORMAL LOW (ref 12.0–15.0)
MCH: 33.3 pg (ref 26.0–34.0)
MCHC: 32.3 g/dL (ref 30.0–36.0)
MCV: 103.3 fL — ABNORMAL HIGH (ref 80.0–100.0)
Platelets: 76 10*3/uL — ABNORMAL LOW (ref 150–400)
RBC: 2.4 MIL/uL — ABNORMAL LOW (ref 3.87–5.11)
RDW: 16.7 % — ABNORMAL HIGH (ref 11.5–15.5)
WBC: 3.5 10*3/uL — ABNORMAL LOW (ref 4.0–10.5)
nRBC: 0 % (ref 0.0–0.2)

## 2023-12-02 LAB — BASIC METABOLIC PANEL
Anion gap: 8 (ref 5–15)
BUN: 35 mg/dL — ABNORMAL HIGH (ref 8–23)
CO2: 19 mmol/L — ABNORMAL LOW (ref 22–32)
Calcium: 8 mg/dL — ABNORMAL LOW (ref 8.9–10.3)
Chloride: 113 mmol/L — ABNORMAL HIGH (ref 98–111)
Creatinine, Ser: 1.27 mg/dL — ABNORMAL HIGH (ref 0.44–1.00)
GFR, Estimated: 41 mL/min — ABNORMAL LOW (ref >60)
Glucose, Bld: 101 mg/dL — ABNORMAL HIGH (ref 70–99)
Potassium: 3.4 mmol/L — ABNORMAL LOW (ref 3.5–5.1)
Sodium: 140 mmol/L (ref 135–145)

## 2023-12-02 LAB — RAD ONC ARIA SESSION SUMMARY
Course Elapsed Days: 14
Plan Fractions Treated to Date: 9
Plan Prescribed Dose Per Fraction: 3 Gy
Plan Total Fractions Prescribed: 10
Plan Total Prescribed Dose: 30 Gy
Reference Point Dosage Given to Date: 27 Gy
Reference Point Session Dosage Given: 3 Gy
Session Number: 9

## 2023-12-02 MED ORDER — FAMOTIDINE 20 MG PO TABS
10.0000 mg | ORAL_TABLET | Freq: Every day | ORAL | Status: DC
  Administered 2023-12-02: 10 mg via ORAL
  Filled 2023-12-02: qty 1

## 2023-12-02 MED ORDER — SUCRALFATE 1 GM/10ML PO SUSP
1.0000 g | Freq: Three times a day (TID) | ORAL | Status: DC
  Administered 2023-12-03 – 2023-12-04 (×5): 1 g via ORAL
  Filled 2023-12-02 (×6): qty 10

## 2023-12-02 MED ORDER — FAMOTIDINE 20 MG PO TABS: 10.0000 mg | ORAL_TABLET | Freq: Every day | ORAL | Status: DC

## 2023-12-02 MED ORDER — PANTOPRAZOLE SODIUM 20 MG PO TBEC
20.0000 mg | DELAYED_RELEASE_TABLET | Freq: Every day | ORAL | Status: DC
  Administered 2023-12-02 – 2023-12-04 (×3): 20 mg via ORAL
  Filled 2023-12-02 (×3): qty 1

## 2023-12-02 MED ORDER — LIDOCAINE VISCOUS HCL 2 % MT SOLN
15.0000 mL | Freq: Three times a day (TID) | OROMUCOSAL | Status: DC
  Administered 2023-12-02: 15 mL via OROMUCOSAL
  Filled 2023-12-02 (×5): qty 15

## 2023-12-02 MED ORDER — ISOSORBIDE MONONITRATE ER 60 MG PO TB24
60.0000 mg | ORAL_TABLET | Freq: Every day | ORAL | Status: DC
  Administered 2023-12-03 – 2023-12-04 (×2): 60 mg via ORAL
  Filled 2023-12-02 (×3): qty 1

## 2023-12-02 MED ORDER — FULVESTRANT 250 MG/5ML IM SOSY: 500.0000 mg | PREFILLED_SYRINGE | Freq: Once | INTRAMUSCULAR | Status: DC

## 2023-12-02 MED ORDER — ENSURE ENLIVE PO LIQD
237.0000 mL | Freq: Three times a day (TID) | ORAL | Status: DC
  Administered 2023-12-02 – 2023-12-04 (×6): 237 mL via ORAL

## 2023-12-02 NOTE — Progress Notes (Signed)
 Initial Nutrition Assessment  DOCUMENTATION CODES:   Severe malnutrition in context of chronic illness  INTERVENTION:   -Increase Ensure Plus High Protein to TID, each supplement provides 350 kcal and 20 grams of protein.   NUTRITION DIAGNOSIS:   Severe Malnutrition related to chronic illness, cancer and cancer related treatments as evidenced by severe fat depletion, severe muscle depletion.  GOAL:   Patient will meet greater than or equal to 90% of their needs  MONITOR:   PO intake, Supplement acceptance  REASON FOR ASSESSMENT:   Consult Assessment of nutrition requirement/status  ASSESSMENT:   89 y.o. 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  Patient in room, no family at bedside.  Pt reports poor appetite. Had lunch tray sitting in room, states she doesn't want to eat it. States she will drink an Ensure. Pt attempts to eat 3 times a day at home. Some days she doesn't eat well at all, it varies. Pt states for breakfast she may have peanut butter toast with juice and coffee, for lunch she may have a [place] sandwich with tea or soda, sometimes with chips, and then dinner varies, meat, sides. She snacks on fresh fruit. She tries to drink Ensure at home as well for protein. Here she is drinking ~2 Ensures daily. Denies any taste changes. Pt with dysphagia r/t esophageal dysmotility and stricture.  Admission weight: 117 lbs Weighed patient on bed scale in room today: 117 lbs (this was with pillows and blankets on bed). Pt states she weighed ~105 lbs on her home scale PTA. Reports UBW ~122 lbs.  Medications: Pepcid, Carafate  Labs reviewed: Low K   NUTRITION - FOCUSED PHYSICAL EXAM:  Flowsheet Row Most Recent Value  Orbital Region Moderate depletion  Upper Arm Region Severe depletion  Thoracic and Lumbar Region Severe depletion  Buccal Region Moderate depletion  Temple Region Moderate depletion   Clavicle Bone Region Moderate depletion  Clavicle and Acromion Bone Region Moderate depletion  Scapular Bone Region Severe depletion  Dorsal Hand Moderate depletion  Patellar Region Moderate depletion  Anterior Thigh Region Moderate depletion  Posterior Calf Region Severe depletion  Edema (RD Assessment) None  Hair Reviewed  [thinning]  Eyes Reviewed  Mouth Reviewed  Skin Reviewed  Nails Reviewed       Diet Order:   Diet Order             Diet regular Room service appropriate? Yes; Fluid consistency: Thin  Diet effective now                   EDUCATION NEEDS:   No education needs have been identified at this time  Skin:  Skin Assessment: Reviewed RN Assessment  Last BM:  2/25 -type 6  Height:   Ht Readings from Last 1 Encounters:  11/21/23 5\' 6"  (1.676 m)    Weight:   Wt Readings from Last 1 Encounters:  11/21/23 53.5 kg    BMI:  Body mass index is 19.04 kg/m.  Estimated Nutritional Needs:   Kcal:  1600-1800  Protein:  80-90g  Fluid:  1.8L/day  Tilda Franco, MS, RD, LDN Inpatient Clinical Dietitian Contact via Secure chat

## 2023-12-02 NOTE — Plan of Care (Signed)
 Pt transferred to unit form 4E, alert and oriented. POC reviewed with pt. Pt arrived to the unit on 2 L [place] for SOB. Foam dressings applied to back and buttocks to preserve skin integrity. Call light within reach.   Problem: Education: Goal: Knowledge of General Education information will improve Description: Including pain rating scale, medication(s)/side effects and non-pharmacologic comfort measures Outcome: Progressing   Problem: Activity: Goal: Risk for activity intolerance will decrease Outcome: Progressing   Problem: Nutrition: Goal: Adequate nutrition will be maintained Outcome: Progressing   Problem: Safety: Goal: Ability to remain free from injury will improve Outcome: Progressing   Problem: Skin Integrity: Goal: Risk for impaired skin integrity will decrease Outcome: Progressing

## 2023-12-02 NOTE — Progress Notes (Signed)
 Occupational Therapy Treatment Patient Details Name: [name] MRN: [id number] DOB: 04/29/1934 Today's Date: 12/02/2023   History of present illness Patient is a 89 year old female admitted to the hospital on 11/21/23 with intractable pain and acute renal failure. 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 She began palliative radiation to her spine 11/18/2023   OT comments  Patient was noted to have poor activity tolerance with patient fatiguing with transition to bathroom with cues on sitting to rest prior to rushing back to bed/recliner. Patient was educated on importance of sitting up out of bed. Patient verbalized understanding but limited by back pain. Patient will benefit from continued inpatient follow up therapy, <3 hours/day.       If plan is discharge home, recommend the following:  A little help with bathing/dressing/bathroom;Assistance with cooking/housework;A little help with walking and/or transfers   Equipment Recommendations  BSC/3in1       Precautions / Restrictions Precautions Precautions: Fall Precaution/Restrictions Comments: incontinent Restrictions Weight Bearing Restrictions Per Provider Order: No       Mobility Bed Mobility Overal bed mobility: Needs Assistance Bed Mobility: Supine to Sit     Supine to sit: Contact guard     General bed mobility comments: with increased time.            Balance Overall balance assessment: Needs assistance Sitting-balance support: No upper extremity supported Sitting balance-Leahy Scale: Fair     Standing balance support: Bilateral upper extremity supported, Reliant on assistive device for balance Standing balance-Leahy Scale: Poor           ADL either performed or assessed with clinical judgement   ADL Overall ADL's : Needs assistance/impaired                         Toilet Transfer:  Ambulation;Rolling walker (2 wheels);Minimal assistance Toilet Transfer Details (indicate cue type and reason): with patient having increased knee flexion with increased time. needing to sit down in bahtroom with increased fatigue. patient educated on importance of taking a break.                  Cognition Arousal: Alert Behavior During Therapy: WFL for tasks assessed/performed, Flat affect           Following commands: Intact                      Pertinent Vitals/ Pain       Pain Assessment Pain Assessment: Faces Faces Pain Scale: Hurts a little bit Pain Location: back Pain Descriptors / Indicators: Discomfort Pain Intervention(s): Limited activity within patient's tolerance, Monitored during session         Frequency  Min 1X/week        Progress Toward Goals  OT Goals(current goals can now be found in the care plan section)  Progress towards OT goals: Progressing toward goals     Plan         AM-PAC OT "6 Clicks" Daily Activity     Outcome Measure   Help from another person eating meals?: None Help from another person taking care of personal grooming?: A Little Help from another person toileting, which includes using toliet, bedpan, or urinal?: A Little Help from another person bathing (including washing, rinsing, drying)?: A Little Help from another person to put on and taking off regular upper body clothing?: A Little Help from  another person to put on and taking off regular lower body clothing?: A Little 6 Click Score: 19    End of Session Equipment Utilized During Treatment: Rolling walker (2 wheels);Gait belt  OT Visit Diagnosis: Unsteadiness on feet (R26.81);Pain;Muscle weakness (generalized) (M62.81)   Activity Tolerance Patient tolerated treatment well   Patient Left in chair;with call bell/phone within reach   Nurse Communication Mobility status        Time: 4098-1191 OT Time Calculation (min): 16 min  Charges: OT General  Charges $OT Visit: 1 Visit OT Treatments $Self Care/Home Management : 8-22 mins  Rosalio Loud, MS Acute Rehabilitation Department Office# [phone]   Selinda Flavin 12/02/2023, 1:01 PM

## 2023-12-02 NOTE — Plan of Care (Signed)
  Problem: Health Behavior/Discharge Planning: Goal: Ability to manage health-related needs will improve Outcome: Progressing   Problem: Clinical Measurements: Goal: Respiratory complications will improve Outcome: Progressing   Problem: Activity: Goal: Risk for activity intolerance will decrease Outcome: Progressing   

## 2023-12-02 NOTE — TOC Progression Note (Addendum)
 Transition of Care [facility]) - Progression Note    Patient Details  Name: [name] MRN: [id number] Date of Birth: Dec 14, 1933  [facility]) CM/SW Contact  Raylynne Cubbage, Olegario Messier, RN Phone Number: 12/02/2023, 11:43 AM  Clinical Narrative:   await bed choice.emailed list to dtr Jerral Ralph & left in rm. -3p-patient chose [facility] [place] following for d/c tomorrow. Per oncology last xrt treatment tomorrow.    Expected Discharge Plan: Skilled Nursing Facility Barriers to Discharge: Continued Medical Work up  Expected Discharge Plan and Services   Discharge Planning Services: CM Consult Post Acute Care Choice: Skilled Nursing Facility Living arrangements for the past 2 months: Single Family Home                                       Social Determinants of Health (SDOH) Interventions SDOH Screenings   Food Insecurity: No Food Insecurity (11/21/2023)  Housing: Low Risk  (11/21/2023)  Transportation Needs: No Transportation Needs (11/21/2023)  Utilities: Not At Risk (11/21/2023)  Depression (PHQ2-9): Low Risk  (11/11/2023)  Social Connections: Moderately Integrated (11/21/2023)  Tobacco Use: Low Risk  (11/21/2023)    Readmission Risk Interventions    11/23/2023   11:56 AM  Readmission Risk Prevention Plan  Transportation Screening Complete  PCP or Specialist Appt within 5-7 Days Complete  Home Care Screening Complete  Medication Review (RN CM) Complete

## 2023-12-02 NOTE — Progress Notes (Addendum)
 [name]   DOB:1935-09-88   HY#:[id number]      ASSESSMENT & PLAN:  1.  Breast cancer with mets to liver, spine, peritoneum -Initially diagnosed with right-sided breast cancer in 2010.  Status post lumpectomy, right breast RT and anastrozole, completed in 2015. 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  Subsequently had a left breast lumpectomy with radioactive seed localization in 2020. -More recently, status post Faslodex and Abemaciclib (Verzenio). Now on hold. - Consider administration of Faslodex as inpatient if approved. - Consideration for continuing oncologic systemic therapy versus palliative care.  May resume therapy if she improves. - Encourage patient to stay out of bed and increase dietary intake.   - Radiation therapy x 10 fractions, tolerating well - Follows with medical oncology/Dr. Truett Perna.  Continue follow-up as outpatient upon discharge.   2.  Severe back pain/abdominal pain -Improving back pain, denies current abdominal pain - Admitted with severe back pain and abdominal pain. - Continue judicious pain management as needed - Continue PT   3.  Failure to thrive/dehydration Malnutrition - Admitted with failure to thrive, dehydration, and constipation. -Improving   4.  Pancytopenia - Likely due to Verzenio, now on hold -CBC checked today WBC 3.5, Hgb 8.0, platelets 76 K - Transfuse PRBC for Hgb <7.0.  No transfusional intervention warranted at this time. - Transfuse platelets for counts <20 K or <50 K with active bleeding.  No transfusional intervention warranted at this time.    - Will continue to monitor CBC with differential   5.  AKI - Creatinine stable 1.27 today - Avoid nephrotoxic agents - Continue gentle hydration   6.  History of ovarian CA - In 2009.  No recurrence to date.   7.  New onset A-fib -Elevated troponin - Seen by cardiology, not a candidate for [facility].      Code Status DNR-limited  Subjective:  Patient seen awake and  alert sitting in chair at bedside.  Reports that she is okay and has no complaints.  No acute distress is noted.  Objective:  Vitals:   12/02/23 0322 12/02/23 1016  BP: (!) 144/77 (!) 146/76  Pulse: 88   Resp: 18   Temp: 98.1 F (36.7 C)   SpO2: 99%      Intake/Output Summary (Last 24 hours) at 12/02/2023 1024 Last data filed at 12/01/2023 1800 Gross per 24 hour  Intake 120 ml  Output --  Net 120 ml     REVIEW OF SYSTEMS:   Constitutional: + Fatigue, + improved back pain, denies fevers, chills or abnormal night sweats Eyes: Denies blurriness of vision, double vision or watery eyes Ears, nose, mouth, throat, and face: Denies mucositis or sore throat Respiratory: Denies cough, dyspnea or wheezes Cardiovascular: Denies palpitation, chest discomfort or lower extremity swelling Gastrointestinal: +abdominal pain resolved skin: Denies abnormal skin rashes Lymphatics: Denies new lymphadenopathy or easy bruising Neurological: Denies numbness, tingling or new weaknesses Behavioral/Psych: Mood is stable, no new changes  All other systems were reviewed with the patient and are negative.  PHYSICAL EXAMINATION: ECOG PERFORMANCE STATUS: 2 - Symptomatic, <50% confined to bed  Vitals:   12/02/23 0322 12/02/23 1016  BP: (!) 144/77 (!) 146/76  Pulse: 88   Resp: 18   Temp: 98.1 F (36.7 C)   SpO2: 99%    Filed Weights   11/21/23 1525  Weight: 117 lb 15.1 oz (53.5 kg)    GENERAL: alert, no distress and comfortable SKIN: skin color, texture, turgor are normal,  no rashes or significant lesions EYES: normal, conjunctiva are pink and non-injected, sclera clear OROPHARYNX: no exudate, no erythema and lips, buccal mucosa, and tongue normal  NECK: supple, thyroid normal size, non-tender, without nodularity LYMPH: no palpable lymphadenopathy in the cervical, axillary or inguinal LUNGS: clear to auscultation and percussion with normal breathing effort HEART: regular rate & rhythm and no  murmurs and no lower extremity edema ABDOMEN: abdomen soft, non-tender and normal bowel sounds MUSCULOSKELETAL: no cyanosis of digits and no clubbing  PSYCH: alert & oriented x 3 with fluent speech NEURO: no focal motor/sensory deficits   All questions were answered. The patient knows to call the clinic with any problems, questions or concerns.   The total time spent in the appointment was 40 minutes encounter with patient including review of chart and various tests results, discussions about plan of care and coordination of care plan  Dawson Bills, NP 12/02/2023 10:24 AM    Labs Reviewed:  Lab Results  Component Value Date   WBC 3.5 (L) 12/02/2023   HGB 8.0 (L) 12/02/2023   HCT 24.8 (L) 12/02/2023   MCV 103.3 (H) 12/02/2023   PLT 76 (L) 12/02/2023   Recent Labs    11/24/23 0711 11/25/23 0520 11/27/23 0516 11/28/23 0514 11/30/23 0537 12/01/23 0505 12/02/23 0501  NA 139   < > 138   < > 138 138 140  K 2.9*   < > 3.0*   < > 4.3 3.8 3.4*  CL 114*   < > 110   < > 112* 113* 113*  CO2 15*   < > 20*   < > 20* 19* 19*  GLUCOSE 116*   < > 136*   < > 115* 108* 101*  BUN 98*   < > 69*   < > 35* 38* 35*  CREATININE 2.15*   < > 1.78*   < > 1.22* 1.36* 1.27*  CALCIUM 8.0*   < > 7.9*   < > 8.0* 7.7* 8.0*  GFRNONAA 22*   < > 27*   < > 43* 38* 41*  PROT 4.2*  --  4.0*  --  4.2*  --   --   ALBUMIN 2.3*  --  2.3*  --  2.3*  --   --   AST 37  --  16  --  15  --   --   ALT 382*  --  131*  --  65*  --   --   ALKPHOS 111  --  76  --  64  --   --   BILITOT 0.9  --  0.8  --  0.7  --   --   BILIDIR  --   --  0.2  --   --   --   --   IBILI  --   --  0.6  --   --   --   --    < > = values in this interval not displayed.    Studies Reviewed:  DG ESOPHAGUS W SINGLE CM (SOL OR THIN BA) Result Date: 12/01/2023 CLINICAL DATA:  Dysphagia, pain during swallowing. EXAM: ESOPHOGRAM/BARIUM SWALLOW TECHNIQUE: Single contrast examination was performed using  thin barium. FLUOROSCOPY: Radiation Exposure  Index (as provided by the fluoroscopic device): 7.0 mGy Kerma COMPARISON:  None Available. FINDINGS: Limited assessment of swallowing due to kyphosis. Poor esophageal motility with stasis of contrast in the esophagus. Gastroesophageal junction does expand somewhat during swallowing but a 13 mm barium tablet  would not pass into the stomach. IMPRESSION: 1. Slight narrowing at the gastroesophageal junction, through which a 13 mm barium tablet would not pass. 2. Esophageal dysmotility. Electronically Signed   By: Leanna Battles M.D.   On: 12/01/2023 09:00   ECHOCARDIOGRAM COMPLETE Result Date: 11/25/2023    ECHOCARDIOGRAM REPORT   Patient Name:   LIANNI KANAAN Date of Exam: 11/25/2023 Medical Rec #:  [id number]        Height:       66.0 in Accession #:    [id number]       Weight:       117.9 lb Date of Birth:  21-May-1934       BSA:          1.598 m Patient Age:    89 years         BP:           144/56 mmHg Patient Gender: F                HR:           88 bpm. Exam Location:  Inpatient Procedure: 2D Echo, Cardiac Doppler and Color Doppler (Both Spectral and Color            Flow Doppler were utilized during procedure). Indications:    Chest Pain R07.9  History:        Patient has prior history of Echocardiogram examinations, most                 recent 03/22/2022. CAD; Risk Factors:Hypertension.  Sonographer:    Webb Laws Referring Phys: [id number] SUNIT TOLIA IMPRESSIONS  1. Severe basal septal hypertrophy on off axis imaging; maximal septal thickness 20 mm. Maximal apical hypertrophy 20 mm. No resting gradient noted. Left ventricular ejection fraction, by estimation, is 65 to 70%. The left ventricle has normal function.  The left ventricle has no regional wall motion abnormalities. There is severe asymmetric left ventricular hypertrophy of the basal-septal segment. Left ventricular diastolic function could not be evaluated.  2. Right ventricular systolic function is normal. The right ventricular size is  normal.  3. Left atrial size was mildly dilated.  4. The mitral valve is normal in structure. No evidence of mitral valve regurgitation. No evidence of mitral stenosis. The mean mitral valve gradient is 2.0 mmHg. Moderate mitral annular calcification.  5. The aortic valve is tricuspid. There is mild calcification of the aortic valve. Aortic valve regurgitation is mild. No aortic stenosis is present.  6. The inferior vena cava is normal in size with greater than 50% respiratory variability, suggesting right atrial pressure of 3 mmHg. Comparison(s): Prior images reviewed side by side. Aortic regurgitation not seen in 2023 study. Maximal basal septal thickness 14 mm on better quality long axis view. Apex not well seen in 2023 study. Conclusion(s)/Recommendation(s): Consider outpatient CMR for clarification of hypertrophy if clinically indicated. FINDINGS  Left Ventricle: Severe basal septal hypertrophy on off axis imaging; maximal septal thickness 20 mm. Maximal apical hypertrophy 20 mm. No resting gradient noted. Left ventricular ejection fraction, by estimation, is 65 to 70%. The left ventricle has normal function. The left ventricle has no regional wall motion abnormalities. Global longitudinal strain performed but not reported based on interpreter judgement due to suboptimal tracking. The left ventricular internal cavity size was normal in size. There is severe asymmetric left ventricular hypertrophy of the basal-septal segment. Left ventricular diastolic function could not be evaluated due to mitral annular calcification (moderate or greater).  Left ventricular diastolic function could not be evaluated. Right Ventricle: The right ventricular size is normal. No increase in right ventricular wall thickness. Right ventricular systolic function is normal. Left Atrium: Left atrial size was mildly dilated. Right Atrium: Right atrial size was normal in size. Pericardium: There is no evidence of pericardial effusion.  Mitral Valve: The mitral valve is normal in structure. Moderate mitral annular calcification. No evidence of mitral valve regurgitation. No evidence of mitral valve stenosis. The mean mitral valve gradient is 2.0 mmHg. Tricuspid Valve: The tricuspid valve is normal in structure. Tricuspid valve regurgitation is mild . No evidence of tricuspid stenosis. Aortic Valve: The aortic valve is tricuspid. There is mild calcification of the aortic valve. Aortic valve regurgitation is mild. No aortic stenosis is present. Pulmonic Valve: The pulmonic valve was not well visualized. Pulmonic valve regurgitation is not visualized. No evidence of pulmonic stenosis. Aorta: The aortic root and ascending aorta are structurally normal, with no evidence of dilitation. Venous: The inferior vena cava is normal in size with greater than 50% respiratory variability, suggesting right atrial pressure of 3 mmHg. IAS/Shunts: The atrial septum is grossly normal. Additional Comments: 3D imaging was not performed.  LEFT VENTRICLE PLAX 2D LVIDd:         4.00 cm   Diastology LVIDs:         3.20 cm   LV e' medial:    5.11 cm/s LV PW:         1.25 cm   LV E/e' medial:  21.7 LV IVS:        1.10 cm   LV e' lateral:   9.03 cm/s LVOT diam:     1.70 cm   LV E/e' lateral: 12.3 LV SV:         51 LV SV Index:   32 LVOT Area:     2.27 cm  RIGHT VENTRICLE             IVC RV Basal diam:  3.00 cm     IVC diam: 1.40 cm RV S prime:     11.60 cm/s TAPSE (M-mode): 1.5 cm LEFT ATRIUM             Index        RIGHT ATRIUM          Index LA diam:        4.50 cm 2.82 cm/m   RA Area:     7.16 cm LA Vol (A2C):   54.4 ml 34.05 ml/m  RA Volume:   13.10 ml 8.20 ml/m LA Vol (A4C):   47.2 ml 29.54 ml/m LA Biplane Vol: 54.4 ml 34.05 ml/m  AORTIC VALVE LVOT Vmax:   145.50 cm/s LVOT Vmean:  89.250 cm/s LVOT VTI:    0.224 m  AORTA Ao Root diam: 2.70 cm Ao Asc diam:  3.10 cm MITRAL VALVE                TRICUSPID VALVE MV Area (PHT): 4.15 cm     TR Peak grad:   25.0 mmHg MV  Mean grad:  2.0 mmHg     TR Vmax:        250.00 cm/s MV Decel Time: 183 msec MV E velocity: 111.00 cm/s  SHUNTS                             Systemic VTI:  0.22 m  Systemic Diam: 1.70 cm Riley Lam MD Electronically signed by Riley Lam MD Signature Date/Time: 11/25/2023/9:28:52 AM    Final    CT ABDOMEN PELVIS WO CONTRAST Result Date: 11/23/2023 CLINICAL DATA:  Metastatic breast cancer. Acute abdominal pain. Acute kidney injury. * Tracking Code: BO * EXAM: CT ABDOMEN AND PELVIS WITHOUT CONTRAST TECHNIQUE: Multidetector CT imaging of the abdomen and pelvis was performed following the standard protocol without IV contrast. RADIATION DOSE REDUCTION: This exam was performed according to the departmental dose-optimization program which includes automated exposure control, adjustment of the mA and/or kV according to patient size and/or use of iterative reconstruction technique. COMPARISON:  10/16/2023 FINDINGS: Lower chest: Right coronary artery descending thoracic aortic atheromatous vascular calcification. Trace pericardial effusion. Mitral valve calcification. Small bilateral pleural effusions with passive atelectasis. Small type 1 hiatal hernia. Scattered small pulmonary nodules in the lung bases as on 10/16/2023, probably from metastatic disease. Hepatobiliary: Scattered hepatic hypodense lesions compatible with metastatic disease. A confluence of two lesions posteriorly in the right hepatic lobe measures 4.1 by 2.2 cm on image 8 series 2. A medial hypodense lesion posteriorly in the right hepatic lobe measures 4.4 by 3.1 cm on image 10 series 2, formerly about 4.0 by 2.7 cm. Cholecystectomy. Distal common bile duct proximally 9 mm in diameter, borderline prominent for age. Pancreas: Atrophic, otherwise unremarkable. Spleen: Unremarkable Adrenals/Urinary Tract: 2.0 by 2.0 cm right adrenal mass, internal density 34 Hounsfield units which is indeterminate. Bilateral  nonobstructive renal calculi. Mild right hydronephrosis and proximal hydroureter extending to the iliac vessel cross over, however with no distal hydroureter. Foley catheter in the urinary bladder which is mostly empty although there is a small amount of gas in the lumen of the urinary bladder. Stomach/Bowel: Small periampullary duodenal diverticulum. Large caliber distal colon and rectum containing progressive dilution of contrast medium as well as stool contents. Prior bowel resections include the right colon and at least part of the ileum. Vascular/Lymphatic: Atherosclerosis is present, including aortoiliac atherosclerotic disease. Reproductive: Uterus absent.  Adnexa unremarkable. Other: No supplemental non-categorized findings. Musculoskeletal: We partially include what appears to be a compression fracture and possible lytic lesions involving the T11 vertebral body. Suspected right eleventh rib fracture medially. Lucency eccentric to the left in the T12 vertebral body may be from metastatic disease. Bony destructive findings in the posterior elements at L4 suspicious for osseous metastatic disease. Grade 1 anterolisthesis at L5-S1. Lucency in the left iliac wing on image 53 series 4 is probably a metastatic lesion. There is also lucency further medially in the left iliac bone on image 77 series 4. IMPRESSION: 1. Mild right hydronephrosis and proximal hydroureter extending to the iliac vessel cross over, however with no distal hydroureter. This could be from a distal ureteral stricture or from a recently passed calculus. 2. Bilateral nonobstructive renal calculi. 3. Scattered hepatic hypodense lesions compatible with metastatic disease. 4. Scattered small pulmonary nodules in the lung bases as on 10/16/2023, probably from metastatic disease. 5. 2.0 by 2.0 cm right adrenal mass, internal density 34 Hounsfield units which is indeterminate but quite likely metastatic. 6. We partially include what appears to be a  compression fracture and possible lytic lesions involving the T11 vertebral body. Suspected right eleventh rib fracture medially. Bony destructive findings in the posterior elements at L4 suspicious for osseous metastatic disease. Left iliac lytic lesions suspicious for metastatic disease. 7. Small bilateral pleural effusions with passive atelectasis. 8. Small type 1 hiatal hernia. 9. Aortic and coronary atherosclerosis. Aortic Atherosclerosis (ICD10-I70.0). Electronically  Signed   By: Gaylyn Rong M.D.   On: 11/23/2023 17:51   US RENAL Result Date: 11/21/2023 CLINICAL DATA:  Acute kidney injury. EXAM: RENAL / URINARY TRACT ULTRASOUND COMPLETE COMPARISON:  CT 10/16/2023 FINDINGS: Right Kidney: Renal measurements: 10.1 x 4.4 x 4.7 cm = volume: 110 mL. Moderate hydronephrosis is new from prior CT. Mild increased renal parenchymal echogenicity typical of chronic renal disease. Renal calculi on prior CT not well-defined on the current exam. No evidence of focal lesion. Left Kidney: Renal measurements: 9 x 4.1 x 4.3 cm = volume: 83 mL. No hydronephrosis. Thinning of renal parenchyma with increased echogenicity. Shadowing 9 mm stone. Small renal cysts, largest 1.7 cm. No evidence of suspicious lesion. Bladder: Appears normal for degree of bladder distention. Other: None. IMPRESSION: 1. Moderate right hydronephrosis is new from prior CT. 2. Increased renal parenchymal echogenicity typical of chronic medical renal disease. 3. Left renal stone. Known right renal calculi on prior CT not well seen. Electronically Signed   By: Narda Rutherford M.D.   On: 11/21/2023 22:06   Ms. Bishop appears unchanged.  She continues to have minimal pain.  She will complete palliative radiation to the spine tomorrow.  She again indicated she would like to continue treatment of the metastatic breast cancer.  The plan is to resume Faslodex and consider switching to a different systemic therapy regimen if there is clear evidence of  disease progression.  Recommendations: Resume Faslodex-I will order inpatient dosing for tomorrow Abemaciclib will remain on hold Complete palliative radiation to the spine Outpatient follow-up will be scheduled at the Cancer center

## 2023-12-02 NOTE — Progress Notes (Signed)
  Daily Progress Note   Patient Name: [name]       Date: 12/02/2023 DOB: [birth year]  Age: 89 y.o. MRN#: [id number] Attending Physician: Alberteen Sam, * Primary Care Physician: Lorenda Ishihara, MD Admit Date: 11/21/2023 Length of Stay: 11 days  Reason for Consultation/Follow-up: Establishing goals of care  Subjective:   CC: Patient notes continued pain while swallowing, particularly with meals.  Following up regarding complex medical decision making.  Subjective:  Reviewed EMR prior to presenting to bedside.  Presented to bedside to meet with patient.  Introduced myself as a member of the palliative medicine team.  Primary symptom management at this time.  Patient primary concern for continued pain swallowing.  Patient notes that this pain is particularly worse when attempting to eat meals.  Discussed possible medical management and starting medications to assist with pain with swallowing including viscous lidocaine and Carafate.  Patient agreeing to start his medications at this time. Spent time providing emotional support reactive listening.  All questions answered at that time.  Noted palliative medicine team continue to follow with patient's medical journey. Objective:   Vital Signs:  BP (!) 144/77 (BP Location: Left Arm)   Pulse 88   Temp 98.1 F (36.7 C) (Oral)   Resp 18   Ht 5\' 6"  (1.676 m)   Wt 53.5 kg   LMP 10/07/1974   SpO2 99%   BMI 19.04 kg/m   Physical Exam: General: NAD, alert, chronically ill-appearing Cardiovascular: RRR, no edema in LE b/l Respiratory: no increased work of breathing noted, not in respiratory distress Neuro: A&Ox4, following commands easily Psych: appropriately answers all questions  Imaging: I personally reviewed recent imaging.   Assessment & Plan:   Assessment: Patient is a 89 year old female with a past medical history of metastatic breast cancer with disease to spine, liver, and peritoneum who was admitted  on 11/21/2023 for management of dehydration and AKI.  During hospitalization patient has received management for AKI, pancytopenia, new onset A-fib, and dysphagia.  During hospitalization patient underwent barium esophagram that showed esophageal dysmotility and a distal stricture.  GI consulted though unable to proceed with dilation due to thrombocytopenia.  Palliative medicine team consulted to assist with complex medical decision making.  Recommendations/Plan: # Complex medical decision making/goals of care:  - Patient continues discussions with oncology regarding cancer directed therapies moving forward.  -Currently planning for discharge to SNF rehab once medically appropriate with outpatient palliative follow-up.  -  Code Status: Limited: Do not attempt resuscitation (DNR) -DNR-LIMITED -Do Not Intubate/DNI   # Symptom management: Patient is receiving these palliative interventions for symptom management with an intent to improve quality of life.   -Odynophagia, in the setting of esophageal dysmotility with distal stricture   -Start lidocaine viscus solution scheduled with meals   -Start Carafate scheduled 3 times daily and bedtime  # Discharge Planning: Skilled Nursing Facility for rehab with Palliative care service follow-up   Discussed with: patient, RN  Thank you for allowing the palliative care team to participate in the care [name].  Alvester Morin, DO Palliative Care Provider PMT # [phone]  If patient remains symptomatic despite maximum doses, please call PMT at [phone] between 0700 and 1900. Outside of these hours, please call attending, as PMT does not have night coverage.

## 2023-12-02 NOTE — Progress Notes (Signed)
 Pt transferred to 1602 at this time. Alert and oriented. C/o of sob during  transfer. Oxygen 99% on RA. 02 at 2L applied, tolerated well.

## 2023-12-02 NOTE — Progress Notes (Signed)
 Progress Note   Patient: [name] ZOX:[id number] DOB: [birth year] DOA: 11/21/2023     11 DOS: the patient was seen and examined on 12/02/2023 at 11:25AM      Brief hospital course: 89 y.o. 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  Sent to ER from clinic due to creatinine 3.0.  In the hospital, noted to have Afib RVR.  Cardiology consulted.  Cr treated with fluids and now resolved to baseline.  Cardiology were consulted and recommended rate control strategy only due to complex advanced oncological disease.  Had persistent chest discomfort with swallowing, SLP eval benign.  Esophargam showed dysmotility and stenosis.  See below.  Since then, awaiting SNF placement.      Assessment and Plan: * AKI (acute kidney injury) (HCC) Cr 3 on admission, treated with fluids and Cr improved to baseline, now stable.   - Hold home chlorthalidone - Avoid hypotension, nephrotoxins  Dysphagia See summary from 2/24 - Soft diet - Outpatient follow up  Failure to thrive Severe protein calorie malnutrition - Continue nutrition supplements - Consult dietitian  New onset a-fib [facility]) Paroxysmal atrial fibrillation with RVR Cardiology were consulted, they recommended avoiding ablation, cardioversion, recommended rate control strategy.  Discussion of anticoagulation with family, this was declined  Rate now controlled - Continue diltiazem and Coreg  Bicytopenia Anemia Thrombocytopenia Discussed with hematology, this is likely due to her Verzenio, and probably bone marrow infiltration. - Supportive care  Hypertrophic cardiomyopathy (HCC) Hard to know if her dyspnea/CP are partially related to OT obstruction. Cardiology were consulted and recommended against invasive testing, cMRI. - Consult Palliative Care  Carcinoma of upper-outer quadrant of left breast in female, estrogen receptor positive (HCC) Metastases to liver  and spine Verzenio currently on hold - Consult Oncology, Radiation oncology - Consult Palliative Care  CAD (coronary artery disease) With chronic stable angina - Hold  aspirin - Resume Imdur  Hypertension Hypotension BP had been soft here.  Now trending back up - Continue Coreg and diltiazem - Hold chlorthalidone, hydralazine, amlodipine - Resume Imdur          Subjective: Patient has no new complaints.  She has had no change to her swallowing, she has had no palpitations, dyspnea, confusion, or fever.     Physical Exam: BP (!) 146/60 (BP Location: Right Arm)   Pulse 89   Temp (!) 97.3 F (36.3 C) (Oral)   Resp 18   Ht 5\' 6"  (1.676 m)   Wt 53.5 kg   LMP 10/07/1974   SpO2 99%   BMI 19.04 kg/m   Elderly adult female, sitting up in recliner, interactive and appropriate Irregular, controlled rate, no murmurs, no peripheral edema Respiratory rate normal, lungs clear without rales or wheezes Abdomen soft without tenderness palpation or guarding Attention normal, affect blunted, judgment and insight appear normal severe generalized weakness    Data Reviewed: Basic metabolic panel shows creatinine 1.2, no change, potassium 3.4 Pancytopenia slightly better    Family Communication: None present    Disposition: Status is: Inpatient The patient was admitted for chest discomfort which turns out to be from esophageal dysmotility and esophageal stricture  She was also noted to have new onset atrial fibrillation  Her condition is stabilized, but as a result of her deconditioning she is now too weak to care for herself (she is normally independent for all self cares, currently she cannot walk) and she is medically ready for discharge to skilled nursing  whenever a bed is available        Author: Alberteen Sam, MD 12/02/2023 3:10 PM  For on call review [email or web address].

## 2023-12-03 ENCOUNTER — Ambulatory Visit
Admission: RE | Admit: 2023-12-03 | Discharge: 2023-12-03 | Disposition: A | Discharge status: Home / Self Care | Payer: Medicare Other | Source: Ambulatory Visit | Attending: Radiation Oncology | Admitting: Radiation Oncology

## 2023-12-03 ENCOUNTER — Other Ambulatory Visit: Payer: Self-pay

## 2023-12-03 DIAGNOSIS — C7951 Secondary malignant neoplasm of bone: Secondary | ICD-10-CM | POA: Diagnosis not present

## 2023-12-03 DIAGNOSIS — E43 Unspecified severe protein-calorie malnutrition: Secondary | ICD-10-CM | POA: Diagnosis not present

## 2023-12-03 DIAGNOSIS — C50412 Malignant neoplasm of upper-outer quadrant of left female breast: Secondary | ICD-10-CM | POA: Diagnosis not present

## 2023-12-03 DIAGNOSIS — N179 Acute kidney failure, unspecified: Secondary | ICD-10-CM | POA: Diagnosis not present

## 2023-12-03 DIAGNOSIS — Z510 Encounter for antineoplastic radiation therapy: Secondary | ICD-10-CM | POA: Diagnosis not present

## 2023-12-03 DIAGNOSIS — R1310 Dysphagia, unspecified: Secondary | ICD-10-CM | POA: Diagnosis not present

## 2023-12-03 DIAGNOSIS — Z170 Estrogen receptor positive status [ER+]: Secondary | ICD-10-CM | POA: Diagnosis not present

## 2023-12-03 DIAGNOSIS — Z79899 Other long term (current) drug therapy: Secondary | ICD-10-CM

## 2023-12-03 DIAGNOSIS — Z515 Encounter for palliative care: Secondary | ICD-10-CM | POA: Diagnosis not present

## 2023-12-03 LAB — BASIC METABOLIC PANEL
Anion gap: 8 (ref 5–15)
BUN: 34 mg/dL — ABNORMAL HIGH (ref 8–23)
CO2: 15 mmol/L — ABNORMAL LOW (ref 22–32)
Calcium: 7.5 mg/dL — ABNORMAL LOW (ref 8.9–10.3)
Chloride: 114 mmol/L — ABNORMAL HIGH (ref 98–111)
Creatinine, Ser: 1.15 mg/dL — ABNORMAL HIGH (ref 0.44–1.00)
GFR, Estimated: 46 mL/min — ABNORMAL LOW (ref >60)
Glucose, Bld: 104 mg/dL — ABNORMAL HIGH (ref 70–99)
Potassium: 2.9 mmol/L — ABNORMAL LOW (ref 3.5–5.1)
Sodium: 137 mmol/L (ref 135–145)

## 2023-12-03 LAB — RAD ONC ARIA SESSION SUMMARY
Course Elapsed Days: 15
Plan Fractions Treated to Date: 10
Plan Prescribed Dose Per Fraction: 3 Gy
Plan Total Fractions Prescribed: 10
Plan Total Prescribed Dose: 30 Gy
Reference Point Dosage Given to Date: 30 Gy
Reference Point Session Dosage Given: 3 Gy
Session Number: 10

## 2023-12-03 LAB — CBC
HCT: 24.1 % — ABNORMAL LOW (ref 36.0–46.0)
Hemoglobin: 7.8 g/dL — ABNORMAL LOW (ref 12.0–15.0)
MCH: 33.3 pg (ref 26.0–34.0)
MCHC: 32.4 g/dL (ref 30.0–36.0)
MCV: 103 fL — ABNORMAL HIGH (ref 80.0–100.0)
Platelets: 74 10*3/uL — ABNORMAL LOW (ref 150–400)
RBC: 2.34 MIL/uL — ABNORMAL LOW (ref 3.87–5.11)
RDW: 16.8 % — ABNORMAL HIGH (ref 11.5–15.5)
WBC: 3.3 10*3/uL — ABNORMAL LOW (ref 4.0–10.5)
nRBC: 0 % (ref 0.0–0.2)

## 2023-12-03 LAB — MAGNESIUM: Magnesium: 1.4 mg/dL — ABNORMAL LOW (ref 1.7–2.4)

## 2023-12-03 MED ORDER — POTASSIUM CHLORIDE CRYS ER 20 MEQ PO TBCR
40.0000 meq | EXTENDED_RELEASE_TABLET | Freq: Two times a day (BID) | ORAL | Status: AC
  Administered 2023-12-03 (×2): 40 meq via ORAL
  Filled 2023-12-03 (×2): qty 2

## 2023-12-03 MED ORDER — OXYCODONE HCL 5 MG PO TABS
2.5000 mg | ORAL_TABLET | Freq: Once | ORAL | Status: AC
  Administered 2023-12-03: 2.5 mg via ORAL
  Filled 2023-12-03: qty 1

## 2023-12-03 MED ORDER — MAGNESIUM SULFATE 4 GM/100ML IV SOLN
4.0000 g | Freq: Once | INTRAVENOUS | Status: AC
  Administered 2023-12-03: 4 g via INTRAVENOUS
  Filled 2023-12-03: qty 100

## 2023-12-03 NOTE — Progress Notes (Signed)
 Physical Therapy Treatment Patient Details Name: [name] MRN: [id number] DOB: Feb 15, 1934 Today's Date: 12/03/2023   History of Present Illness Patient is a 89 year old female admitted to the hospital on 11/21/23 with intractable pain and acute renal failure. 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 She began palliative radiation to her spine 11/18/2023    PT Comments  Pt received in bed, had recently returned from Radiation tx to spine. Pt educated on role of PT and Log roll technique/spinal precautions due to T11 fx (?T12 + L4). Pt aslo has Right 11th rib fx, and L Iliac bone lesions. Pt completed B LE strengthening exercises in supine and sitting. Currently requiring MinA for bed mobility and transfers. Gait training with RW and close CGA due to pt quick to fatigue. Overall great tolerance for PT session. Will continue to progress per POC acutely.   If plan is discharge home, recommend the following: A little help with walking and/or transfers;A little help with bathing/dressing/bathroom;Assistance with cooking/housework;Help with stairs or ramp for entrance;Assist for transportation   Can travel by private vehicle        Equipment Recommendations  Rolling walker (2 wheels)    Recommendations for Other Services       Precautions / Restrictions Precautions Precautions: Fall;Other (comment) (T11 compression fs from Lytic Lesio.) Recall of Precautions/Restrictions: Intact Precaution/Restrictions Comments: Log Roll, Spinal Precautions Restrictions Weight Bearing Restrictions Per Provider Order: No Other Position/Activity Restrictions:  (Precautions for wt bearing restrictions if pain increases due to lytic lesions in T-Spine,  L Iliac, and R 11th rib fx)     Mobility  Bed Mobility Overal bed mobility: Needs Assistance Bed Mobility: Supine to Sit     Supine to sit: Contact guard      General bed mobility comments: with increased time.    Transfers Overall transfer level: Needs assistance Equipment used: Rolling walker (2 wheels) Transfers: Sit to/from Stand Sit to Stand: Min assist           General transfer comment: Assist to steady pt and manage RW. Cues for safety.    Ambulation/Gait Ambulation/Gait assistance: Contact guard assist, Min assist Gait Distance (Feet): 90 Feet Assistive device: Rolling walker (2 wheels) Gait Pattern/deviations: Step-through pattern, Decreased stride length, Trunk flexed Gait velocity: decr     General Gait Details: Assist to stabilize pt and manage RW throughout distance. Pt fatigues very quickly/easily. Fall risk.   Stairs             Wheelchair Mobility     Tilt Bed    Modified Rankin (Stroke Patients Only)       Balance Overall balance assessment: Needs assistance Sitting-balance support: No upper extremity supported Sitting balance-Leahy Scale: Fair     Standing balance support: Bilateral upper extremity supported, Reliant on assistive device for balance Standing balance-Leahy Scale: Fair Standing balance comment: Fair with static standing using RW, remains a fall risk with dynamic mobility due to fatigue                            Communication Communication Communication: No apparent difficulties  Cognition Arousal: Alert Behavior During Therapy: WFL for tasks assessed/performed, Flat affect   PT - Cognitive impairments: No apparent impairments                         Following commands: Intact  Cueing Cueing Techniques: Verbal cues  Exercises General Exercises - Lower Extremity Ankle Circles/Pumps: AROM, Both, 15 reps Quad Sets: AROM, Both, 5 reps, Supine Gluteal Sets: AROM, 5 reps, Both, Supine Long Arc Quad: AROM, Both, 10 reps, Seated Heel Slides: AROM, Both, 5 reps, Supine Hip ABduction/ADduction: AROM, Both, 5 reps, Supine Hip Flexion/Marching:  AROM, Both, 5 reps, Seated    General Comments General comments (skin integrity, edema, etc.): Pt given information regarding spinal precautions, log rolling, B LE strengthening exercises, and proper car transfer technique with good understanding      Pertinent Vitals/Pain Pain Assessment Pain Assessment: 0-10 Pain Score: 5  Pain Location: back Pain Descriptors / Indicators: Aching, Discomfort Pain Intervention(s): Premedicated before session    Home Living                          Prior Function            PT Goals (current goals can now be found in the care plan section) Acute Rehab PT Goals Patient Stated Goal:  (Get stronger) Progress towards PT goals: Progressing toward goals    Frequency    Min 1X/week      PT Plan      Co-evaluation              AM-PAC PT "6 Clicks" Mobility   Outcome Measure  Help needed turning from your back to your side while in a flat bed without using bedrails?: A Little Help needed moving from lying on your back to sitting on the side of a flat bed without using bedrails?: A Little Help needed moving to and from a bed to a chair (including a wheelchair)?: A Little Help needed standing up from a chair using your arms (e.g., wheelchair or bedside chair)?: A Little Help needed to walk in hospital room?: A Little Help needed climbing 3-5 steps with a railing? : Total 6 Click Score: 16    End of Session Equipment Utilized During Treatment: Gait belt Activity Tolerance: Patient limited by fatigue Patient left: in chair;with call bell/phone within reach;with chair alarm set Nurse Communication: Mobility status;Other (comment) (Spinal precautions) PT Visit Diagnosis: Difficulty in walking, not elsewhere classified (R26.2);Adult, failure to thrive (R62.7)     Time: 4098-1191 PT Time Calculation (min) (ACUTE ONLY): 43 min  Charges:    $Gait Training: 8-22 mins $Therapeutic Exercise: 8-22 mins $Therapeutic Activity:  8-22 mins PT General Charges $$ ACUTE PT VISIT: 1 Visit                    Zadie Cleverly, PTA  Jannet Askew 12/03/2023, 4:28 PM

## 2023-12-03 NOTE — Progress Notes (Addendum)
 Speech Language Pathology Treatment: Dysphagia  Patient Details Name: [name] MRN: [id number] DOB: 11-23-1933 Today's Date: 12/03/2023 Time: 6063-0160 SLP Time Calculation (min) (ACUTE ONLY): 10 min  Assessment / Plan / Recommendation Clinical Impression  Pt back from radiation treatment and for potential D/C tonight.  We reviewed results of esophagram on 2/24, which explains her symptoms of early satiety and poor appetite.  Dr. 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   Pt drank some thin liquids - liquids will marginally easier to handle than solids. Provided [name] with a handout outlining some strategies that may help her esophageal dysphagia. Unfortunately, there are no great solutions. Acknowledged how difficult this situation is for her and offered encouragement.  Please crush pills if larger than 1/2 inch in diameter (barium pill this size did not pass through LES on esophagram)  No further SLP f/u is needed - dysphagia is not oropharyngeal in origin.     HPI HPI: [name] is a 89 y.o. 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 She began palliative radiation to her spine 11/18/2023.  MD ordered swallow eval on 2/20 at 1230. Had esophagram on 2/24:  "Poor esophageal  motility with stasis of contrast in the esophagus. Gastroesophageal  junction does expand somewhat during swallowing but a 13 mm barium  tablet would not pass into the stomach."      SLP Plan  All goals met      Recommendations for follow up therapy are one component of a multi-disciplinary discharge planning process, led by the attending physician.  Recommendations may be updated based on patient status, additional functional criteria and insurance authorization.     Recommendations  Diet recommendations: Regular;Thin liquid Liquids provided via: Cup;Straw Medication Administration: Crushed with puree Supervision: Patient able to self feed Compensations: Follow solids with liquid Postural Changes and/or Swallow Maneuvers: Seated upright 90 degrees;Upright 30-60 min after meal                  Oral care BID   Frequent or constant Supervision/Assistance Other (comment) (esophageal dysphagia)     All goals met    [name] L. Alison Frederic, MA CCC/SLP Clinical Specialist - Acute Care SLP Acute Rehabilitation Services Office number [phone]  Blenda Mounts Laurice  12/03/2023, 2:37 PM

## 2023-12-03 NOTE — Progress Notes (Signed)
 Daily Progress Note   Patient Name: [name]       Date: 12/03/2023 DOB: November 02, 1933  Age: 89 y.o. MRN#: [id number] Attending Physician: Briant Cedar, MD Primary Care Physician: Lorenda Ishihara, MD Admit Date: 11/21/2023 Length of Stay: 12 days  Reason for Consultation/Follow-up: Establishing goals of care  Subjective:   CC: Patient notes some pain while eating.  Following up regarding complex medical decision making.  Subjective:  Reviewed EMR prior to presenting to bedside.  Patient tried viscous lidocaine though this was discontinued as patient did not like the taste.  Patient has received only 1 dose of Carafate so difficult to determine efficacy.  Presented to bedside to see patient.  Patient noted she went for radiation this morning.  Patient's only symptom of concern continues to be difficulty swallowing when eating.  Again lidocaine was discontinued as patient did not like the taste.  Encourage patient to take Carafate to determine if would help over time.  Noted would also try to schedule low-dose oxycodone before dinnertime to see if helps relax musculature and assist with pain management.  Patient agreeing with this plan.  All questions answered at that time.  Thanked patient for allow me to visit with her today.  Noted palliative medicine team continue to follow with patient's medical journey.  Discussed care with RN.  Objective:   Vital Signs:  BP (!) 137/51 (BP Location: Right Arm)   Pulse 100   Temp 97.7 F (36.5 C) (Oral)   Resp 18   Ht 5\' 6"  (1.676 m)   Wt 53.5 kg   LMP 10/07/1974   SpO2 98%   BMI 19.04 kg/m   Physical Exam: General: NAD, alert, frail, sitting in chair Cardiovascular: RRR Respiratory: no increased work of breathing noted, not in respiratory distress Neuro: A&Ox4, following commands easily Psych: appropriately answers all questions  Imaging: I personally reviewed recent imaging.   Assessment & Plan:    Assessment: Patient is a 89 year old female with a past medical history of metastatic breast cancer with disease to spine, liver, and peritoneum who was admitted on 11/21/2023 for management of dehydration and AKI.  During hospitalization patient has received management for AKI, pancytopenia, new onset A-fib, and dysphagia.  During hospitalization patient underwent barium esophagram that showed esophageal dysmotility and a distal stricture.  GI consulted though unable to proceed with dilation due to thrombocytopenia.  Palliative medicine team consulted to assist with complex medical decision making.  Recommendations/Plan: # Complex medical decision making/goals of care:  - Patient continues discussions with oncology regarding cancer directed therapies moving forward.  Planning for outpatient follow-up with oncology.  -Currently planning for discharge to SNF rehab once medically appropriate with outpatient palliative follow-up.  -  Code Status: Limited: Do not attempt resuscitation (DNR) -DNR-LIMITED -Do Not Intubate/DNI   # Symptom management: Patient is receiving these palliative interventions for symptom management with an intent to improve quality of life.   -Odynophagia, in the setting of esophageal dysmotility with distal stricture   -Discontinued lidocaine viscus solution as patient did not like taste   -Continue Carafate scheduled 3 times daily and bedtime   -Start scheduled one-time dose of oxycodone 2.5 mg this evening before dinner to see if assist with pain management with supper  # Discharge Planning: Skilled Nursing Facility for rehab with Palliative care service follow-up   Discussed with: patient, RN  Thank you for allowing the palliative care team to participate in the care [name].  Alvester Morin, DO  Palliative Care Provider PMT # [phone]  If patient remains symptomatic despite maximum doses, please call PMT at [phone] between 0700 and 1900. Outside of  these hours, please call attending, as PMT does not have night coverage.

## 2023-12-03 NOTE — Progress Notes (Addendum)
 [name]   DOB:15-Apr-1934   JX#:[id number]      ASSESSMENT & PLAN:  1.  Breast cancer with mets to liver, spine, peritoneum -Initially diagnosed with right-sided breast cancer in 2010.  Status post lumpectomy, right breast RT and anastrozole, completed in 2015. 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  Subsequently had a left breast lumpectomy with radioactive seed localization in 2020. -More recently, status post Faslodex and Abemaciclib (Verzenio). Now on hold. - Faslodex inj as inpatient not approved, will arrange for outpatient administration upon discharge.   - Encourage patient to stay out of bed and increase dietary intake.   - Radiation therapy x 10 fractions.  Also radiation to spine today planned. Has tolerated RT well.  - Follows with medical oncology/Dr. Truett Perna.  Continue follow-up as outpatient upon discharge.   2.  Severe back pain/abdominal pain - Improving back pain, denies current abdominal pain - Continue pain management as needed - Continue PT   3.  Failure to thrive/dehydration Malnutrition Dysphagia  - Admitted with failure to thrive, dehydration, and constipation. - continue nutritional supplements - encourage out of bed more -Improving, for discharge to SNF.   4.  Pancytopenia - Likely due to Verzenio, now on hold -CBC done today:  WBC 3.3, Hgb 7.8, platelets 74K - Transfuse PRBC for Hgb <7.0.  No transfusional intervention warranted at this time. - Transfuse platelets for counts <20 K or <50 K with active bleeding.  No transfusional intervention warranted at this time.    - Will continue to monitor CBC with differential   5.  AKI - Creatinine improving 1.15 today  - Avoid nephrotoxic agents - Continue gentle hydration   6.  History of ovarian CA - In 2009.  No recurrence to date.   7.  New onset A-fib -Elevated troponin - Seen by cardiology, not a candidate for [facility].        Code Status DNR-Limited  Subjective:  Patient seen awake  and alert sitting up in bed with family member at bedside.  Looks better today.  Feels okay however notes some diarrhea and ongoing difficulty swallowing.  Disappointed not getting her Faslodex inj during admission. Informed patient that when she follows at outpatient oncology visit, it will be arranged.  States she is going to [name].  No other acute distress noted.  Objective:  Vitals:   12/03/23 0207 12/03/23 0551  BP: 136/75 (!) 137/51  Pulse: 100   Resp: 18 18  Temp: 97.9 F (36.6 C) 97.7 F (36.5 C)  SpO2: 100% 98%     Intake/Output Summary (Last 24 hours) at 12/03/2023 2130 Last data filed at 12/02/2023 1300 Gross per 24 hour  Intake 120 ml  Output --  Net 120 ml     REVIEW OF SYSTEMS:   Constitutional: +back pain, Denies fevers, chills or abnormal night sweats Eyes: Denies blurriness of vision, double vision or watery eyes Ears, nose, mouth, throat, and face: Denies mucositis or sore throat Respiratory: Denies cough, dyspnea or wheezes Cardiovascular: Denies palpitation, chest discomfort or lower extremity swelling Gastrointestinal:  +diarrhea Skin: Denies abnormal skin rashes Lymphatics: Denies new lymphadenopathy or easy bruising Neurological: Denies numbness, tingling or new weaknesses Behavioral/Psych: Mood is stable, no new changes  All other systems were reviewed with the patient and are negative.  PHYSICAL EXAMINATION: ECOG PERFORMANCE STATUS: 2 - Symptomatic, <50% confined to bed  Vitals:   12/03/23 0207 12/03/23 0551  BP: 136/75 (!) 137/51  Pulse: 100  Resp: 18 18  Temp: 97.9 F (36.6 C) 97.7 F (36.5 C)  SpO2: 100% 98%   Filed Weights   11/21/23 1525  Weight: 117 lb 15.1 oz (53.5 kg)    GENERAL: alert, +frail appearing  SKIN: +pale skin color, texture, turgor are normal, no rashes or significant lesions EYES: normal, conjunctiva are pink and non-injected, sclera clear OROPHARYNX: no exudate, no erythema and lips, buccal mucosa, and  tongue normal  NECK: supple, thyroid normal size, non-tender, without nodularity LYMPH: no palpable lymphadenopathy in the cervical, axillary or inguinal LUNGS: clear to auscultation and percussion with normal breathing effort HEART: regular rate & rhythm and no murmurs and no lower extremity edema ABDOMEN: abdomen soft, non-tender and normal bowel sounds MUSCULOSKELETAL: no cyanosis of digits and no clubbing  PSYCH: alert & oriented x 3 with fluent speech NEURO: no focal motor/sensory deficits   All questions were answered. The patient knows to call the clinic with any problems, questions or concerns.   The total time spent in the appointment was 40 minutes encounter with patient including review of chart and various tests results, discussions about plan of care and coordination of care plan  Dawson Bills, NP 12/03/2023 9:23 AM    Labs Reviewed:  Lab Results  Component Value Date   WBC 3.3 (L) 12/03/2023   HGB 7.8 (L) 12/03/2023   HCT 24.1 (L) 12/03/2023   MCV 103.0 (H) 12/03/2023   PLT 74 (L) 12/03/2023   Recent Labs    11/24/23 0711 11/25/23 0520 11/27/23 0516 11/28/23 0514 11/30/23 0537 12/01/23 0505 12/02/23 0501 12/03/23 0614  NA 139   < > 138   < > 138 138 140 137  K 2.9*   < > 3.0*   < > 4.3 3.8 3.4* 2.9*  CL 114*   < > 110   < > 112* 113* 113* 114*  CO2 15*   < > 20*   < > 20* 19* 19* 15*  GLUCOSE 116*   < > 136*   < > 115* 108* 101* 104*  BUN 98*   < > 69*   < > 35* 38* 35* 34*  CREATININE 2.15*   < > 1.78*   < > 1.22* 1.36* 1.27* 1.15*  CALCIUM 8.0*   < > 7.9*   < > 8.0* 7.7* 8.0* 7.5*  GFRNONAA 22*   < > 27*   < > 43* 38* 41* 46*  PROT 4.2*  --  4.0*  --  4.2*  --   --   --   ALBUMIN 2.3*  --  2.3*  --  2.3*  --   --   --   AST 37  --  16  --  15  --   --   --   ALT 382*  --  131*  --  65*  --   --   --   ALKPHOS 111  --  76  --  64  --   --   --   BILITOT 0.9  --  0.8  --  0.7  --   --   --   BILIDIR  --   --  0.2  --   --   --   --   --   IBILI  --    --  0.6  --   --   --   --   --    < > = values in this interval not displayed.  Studies Reviewed:  DG ESOPHAGUS W SINGLE CM (SOL OR THIN BA) Result Date: 12/01/2023 CLINICAL DATA:  Dysphagia, pain during swallowing. EXAM: ESOPHOGRAM/BARIUM SWALLOW TECHNIQUE: Single contrast examination was performed using  thin barium. FLUOROSCOPY: Radiation Exposure Index (as provided by the fluoroscopic device): 7.0 mGy Kerma COMPARISON:  None Available. FINDINGS: Limited assessment of swallowing due to kyphosis. Poor esophageal motility with stasis of contrast in the esophagus. Gastroesophageal junction does expand somewhat during swallowing but a 13 mm barium tablet would not pass into the stomach. IMPRESSION: 1. Slight narrowing at the gastroesophageal junction, through which a 13 mm barium tablet would not pass. 2. Esophageal dysmotility. Electronically Signed   By: Leanna Battles M.D.   On: 12/01/2023 09:00   ECHOCARDIOGRAM COMPLETE Result Date: 11/25/2023    ECHOCARDIOGRAM REPORT   Patient Name:   [name] Date of Exam: 11/25/2023 Medical Rec #:  [id number]        Height:       66.0 in Accession #:    [id number]       Weight:       117.9 lb Date of Birth:  1934/08/04       BSA:          1.598 m Patient Age:    89 years         BP:           144/56 mmHg Patient Gender: F                HR:           88 bpm. Exam Location:  Inpatient Procedure: 2D Echo, Cardiac Doppler and Color Doppler (Both Spectral and Color            Flow Doppler were utilized during procedure). Indications:    Chest Pain R07.9  History:        Patient has prior history of Echocardiogram examinations, most                 recent 03/22/2022. CAD; Risk Factors:Hypertension.  Sonographer:    Webb Laws Referring Phys: [id number] SUNIT TOLIA IMPRESSIONS  1. Severe basal septal hypertrophy on off axis imaging; maximal septal thickness 20 mm. Maximal apical hypertrophy 20 mm. No resting gradient noted. Left ventricular ejection fraction,  by estimation, is 65 to 70%. The left ventricle has normal function.  The left ventricle has no regional wall motion abnormalities. There is severe asymmetric left ventricular hypertrophy of the basal-septal segment. Left ventricular diastolic function could not be evaluated.  2. Right ventricular systolic function is normal. The right ventricular size is normal.  3. Left atrial size was mildly dilated.  4. The mitral valve is normal in structure. No evidence of mitral valve regurgitation. No evidence of mitral stenosis. The mean mitral valve gradient is 2.0 mmHg. Moderate mitral annular calcification.  5. The aortic valve is tricuspid. There is mild calcification of the aortic valve. Aortic valve regurgitation is mild. No aortic stenosis is present.  6. The inferior vena cava is normal in size with greater than 50% respiratory variability, suggesting right atrial pressure of 3 mmHg. Comparison(s): Prior images reviewed side by side. Aortic regurgitation not seen in 2023 study. Maximal basal septal thickness 14 mm on better quality long axis view. Apex not well seen in 2023 study. Conclusion(s)/Recommendation(s): Consider outpatient CMR for clarification of hypertrophy if clinically indicated. FINDINGS  Left Ventricle: Severe basal septal hypertrophy on off axis imaging; maximal septal thickness 20 mm. Maximal apical hypertrophy  20 mm. No resting gradient noted. Left ventricular ejection fraction, by estimation, is 65 to 70%. The left ventricle has normal function. The left ventricle has no regional wall motion abnormalities. Global longitudinal strain performed but not reported based on interpreter judgement due to suboptimal tracking. The left ventricular internal cavity size was normal in size. There is severe asymmetric left ventricular hypertrophy of the basal-septal segment. Left ventricular diastolic function could not be evaluated due to mitral annular calcification (moderate or greater). Left ventricular  diastolic function could not be evaluated. Right Ventricle: The right ventricular size is normal. No increase in right ventricular wall thickness. Right ventricular systolic function is normal. Left Atrium: Left atrial size was mildly dilated. Right Atrium: Right atrial size was normal in size. Pericardium: There is no evidence of pericardial effusion. Mitral Valve: The mitral valve is normal in structure. Moderate mitral annular calcification. No evidence of mitral valve regurgitation. No evidence of mitral valve stenosis. The mean mitral valve gradient is 2.0 mmHg. Tricuspid Valve: The tricuspid valve is normal in structure. Tricuspid valve regurgitation is mild . No evidence of tricuspid stenosis. Aortic Valve: The aortic valve is tricuspid. There is mild calcification of the aortic valve. Aortic valve regurgitation is mild. No aortic stenosis is present. Pulmonic Valve: The pulmonic valve was not well visualized. Pulmonic valve regurgitation is not visualized. No evidence of pulmonic stenosis. Aorta: The aortic root and ascending aorta are structurally normal, with no evidence of dilitation. Venous: The inferior vena cava is normal in size with greater than 50% respiratory variability, suggesting right atrial pressure of 3 mmHg. IAS/Shunts: The atrial septum is grossly normal. Additional Comments: 3D imaging was not performed.  LEFT VENTRICLE PLAX 2D LVIDd:         4.00 cm   Diastology LVIDs:         3.20 cm   LV e' medial:    5.11 cm/s LV PW:         1.25 cm   LV E/e' medial:  21.7 LV IVS:        1.10 cm   LV e' lateral:   9.03 cm/s LVOT diam:     1.70 cm   LV E/e' lateral: 12.3 LV SV:         51 LV SV Index:   32 LVOT Area:     2.27 cm  RIGHT VENTRICLE             IVC RV Basal diam:  3.00 cm     IVC diam: 1.40 cm RV S prime:     11.60 cm/s TAPSE (M-mode): 1.5 cm LEFT ATRIUM             Index        RIGHT ATRIUM          Index LA diam:        4.50 cm 2.82 cm/m   RA Area:     7.16 cm LA Vol (A2C):   54.4 ml  34.05 ml/m  RA Volume:   13.10 ml 8.20 ml/m LA Vol (A4C):   47.2 ml 29.54 ml/m LA Biplane Vol: 54.4 ml 34.05 ml/m  AORTIC VALVE LVOT Vmax:   145.50 cm/s LVOT Vmean:  89.250 cm/s LVOT VTI:    0.224 m  AORTA Ao Root diam: 2.70 cm Ao Asc diam:  3.10 cm MITRAL VALVE                TRICUSPID VALVE MV Area (PHT): 4.15 cm  TR Peak grad:   25.0 mmHg MV Mean grad:  2.0 mmHg     TR Vmax:        250.00 cm/s MV Decel Time: 183 msec MV E velocity: 111.00 cm/s  SHUNTS                             Systemic VTI:  0.22 m                             Systemic Diam: 1.70 cm Riley Lam MD Electronically signed by Riley Lam MD Signature Date/Time: 11/25/2023/9:28:52 AM    Final    CT ABDOMEN PELVIS WO CONTRAST Result Date: 11/23/2023 CLINICAL DATA:  Metastatic breast cancer. Acute abdominal pain. Acute kidney injury. * Tracking Code: BO * EXAM: CT ABDOMEN AND PELVIS WITHOUT CONTRAST TECHNIQUE: Multidetector CT imaging of the abdomen and pelvis was performed following the standard protocol without IV contrast. RADIATION DOSE REDUCTION: This exam was performed according to the departmental dose-optimization program which includes automated exposure control, adjustment of the mA and/or kV according to patient size and/or use of iterative reconstruction technique. COMPARISON:  10/16/2023 FINDINGS: Lower chest: Right coronary artery descending thoracic aortic atheromatous vascular calcification. Trace pericardial effusion. Mitral valve calcification. Small bilateral pleural effusions with passive atelectasis. Small type 1 hiatal hernia. Scattered small pulmonary nodules in the lung bases as on 10/16/2023, probably from metastatic disease. Hepatobiliary: Scattered hepatic hypodense lesions compatible with metastatic disease. A confluence of two lesions posteriorly in the right hepatic lobe measures 4.1 by 2.2 cm on image 8 series 2. A medial hypodense lesion posteriorly in the right hepatic lobe measures 4.4 by  3.1 cm on image 10 series 2, formerly about 4.0 by 2.7 cm. Cholecystectomy. Distal common bile duct proximally 9 mm in diameter, borderline prominent for age. Pancreas: Atrophic, otherwise unremarkable. Spleen: Unremarkable Adrenals/Urinary Tract: 2.0 by 2.0 cm right adrenal mass, internal density 34 Hounsfield units which is indeterminate. Bilateral nonobstructive renal calculi. Mild right hydronephrosis and proximal hydroureter extending to the iliac vessel cross over, however with no distal hydroureter. Foley catheter in the urinary bladder which is mostly empty although there is a small amount of gas in the lumen of the urinary bladder. Stomach/Bowel: Small periampullary duodenal diverticulum. Large caliber distal colon and rectum containing progressive dilution of contrast medium as well as stool contents. Prior bowel resections include the right colon and at least part of the ileum. Vascular/Lymphatic: Atherosclerosis is present, including aortoiliac atherosclerotic disease. Reproductive: Uterus absent.  Adnexa unremarkable. Other: No supplemental non-categorized findings. Musculoskeletal: We partially include what appears to be a compression fracture and possible lytic lesions involving the T11 vertebral body. Suspected right eleventh rib fracture medially. Lucency eccentric to the left in the T12 vertebral body may be from metastatic disease. Bony destructive findings in the posterior elements at L4 suspicious for osseous metastatic disease. Grade 1 anterolisthesis at L5-S1. Lucency in the left iliac wing on image 53 series 4 is probably a metastatic lesion. There is also lucency further medially in the left iliac bone on image 77 series 4. IMPRESSION: 1. Mild right hydronephrosis and proximal hydroureter extending to the iliac vessel cross over, however with no distal hydroureter. This could be from a distal ureteral stricture or from a recently passed calculus. 2. Bilateral nonobstructive renal calculi. 3.  Scattered hepatic hypodense lesions compatible with metastatic disease. 4. Scattered small pulmonary nodules in  the lung bases as on 10/16/2023, probably from metastatic disease. 5. 2.0 by 2.0 cm right adrenal mass, internal density 34 Hounsfield units which is indeterminate but quite likely metastatic. 6. We partially include what appears to be a compression fracture and possible lytic lesions involving the T11 vertebral body. Suspected right eleventh rib fracture medially. Bony destructive findings in the posterior elements at L4 suspicious for osseous metastatic disease. Left iliac lytic lesions suspicious for metastatic disease. 7. Small bilateral pleural effusions with passive atelectasis. 8. Small type 1 hiatal hernia. 9. Aortic and coronary atherosclerosis. Aortic Atherosclerosis (ICD10-I70.0). Electronically Signed   By: Gaylyn Rong M.D.   On: 11/23/2023 17:51   US RENAL Result Date: 11/21/2023 CLINICAL DATA:  Acute kidney injury. EXAM: RENAL / URINARY TRACT ULTRASOUND COMPLETE COMPARISON:  CT 10/16/2023 FINDINGS: Right Kidney: Renal measurements: 10.1 x 4.4 x 4.7 cm = volume: 110 mL. Moderate hydronephrosis is new from prior CT. Mild increased renal parenchymal echogenicity typical of chronic renal disease. Renal calculi on prior CT not well-defined on the current exam. No evidence of focal lesion. Left Kidney: Renal measurements: 9 x 4.1 x 4.3 cm = volume: 83 mL. No hydronephrosis. Thinning of renal parenchyma with increased echogenicity. Shadowing 9 mm stone. Small renal cysts, largest 1.7 cm. No evidence of suspicious lesion. Bladder: Appears normal for degree of bladder distention. Other: None. IMPRESSION: 1. Moderate right hydronephrosis is new from prior CT. 2. Increased renal parenchymal echogenicity typical of chronic medical renal disease. 3. Left renal stone. Known right renal calculi on prior CT not well seen. Electronically Signed   By: Narda Rutherford M.D.   On: 11/21/2023 22:06   Ms. Mendosa was interviewed and examined.  She does not have back pain.  She is scheduled for discharge to a nursing facility.  I ordered Faslodex to be given today, but is unclear whether this will be given in the hospital.  She has stable pancytopenia secondary to metastatic breast cancer involving the bones, radiation, and Abemaciclib.  Abemaciclib will remain on hold.  We will arrange for outpatient follow-up and repeat labs at the Cancer center next week.  She has mild solid dysphagia.  The dysphagia may be in part related to radiation esophagitis.  She does not like the viscous lidocaine.  This will be discontinued.  I recommend continuing Protonix and Carafate.  Recommendations: Complete course of spine radiation today Faslodex today, this will be given as an outpatient if they cannot be given in the hospital Replete potassium and magnesium Outpatient follow-up will be scheduled at the Cancer center next week

## 2023-12-03 NOTE — Progress Notes (Signed)
  Progress Note   Patient: [name] ZOX:[id number] DOB: [birth year] DOA: 11/21/2023     12 DOS: the patient was seen and examined on 12/03/2023 at 11:25AM      Brief hospital course: 89 y.o. 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 Sent to ER from clinic due to creatinine 3.0.  In the hospital, noted to have Afib RVR.  Cardiology consulted. AKI treated with fluids and now resolved to baseline. Cardiology consulted and recommended rate control strategy only due to complex advanced oncological disease. Had persistent chest discomfort with swallowing, SLP eval benign.  Esophargam showed dysmotility and stenosis.  Palliative care consulted.    Assessment and Plan: AKI (acute kidney injury) (HCC) Cr 3 on admission, treated with fluids and Cr improved to baseline, now stable.   Hold home chlorthalidone Avoid hypotension, nephrotoxins  Noted chronic diarrhea Has been ongoing, denies any abdominal pain, nausea/vomiting Possibly likely 2/2 chemo/radiation Imodium as needed  Hypokalemia/hypomagnesemia Likely 2/2 above Replaced as needed  Dysphagia 2/2 dysmotility and stenosis Esophagram showed above Soft diet SLP consulted Started on Carafate, unable to tolerate lidocaine, continue oxycodone  Failure to thrive Severe protein calorie malnutrition Continue nutrition supplements Consult dietitian  New onset a-fib (HCC) Paroxysmal atrial fibrillation with RVR Rate now controlled Cardiology were consulted, they recommended avoiding ablation, cardioversion, recommended rate control strategy Discussion of anticoagulation with family, this was declined Continue diltiazem and Coreg  Pancytopenia Anemia Thrombocytopenia Discussed with hematology, this is likely due to her Verzenio, and probably bone marrow infiltration. Supportive care  Hypertrophic cardiomyopathy (HCC) Hard to know if her dyspnea/CP are  partially related to OT obstruction. Cardiology were consulted and recommended against invasive testing, cMRI. Consult Palliative Care  Carcinoma of upper-outer quadrant of left breast in female, estrogen receptor positive (HCC) Metastases to liver and spine Verzenio currently on hold Consult Oncology, Radiation oncology Consult Palliative Care  CAD (coronary artery disease) With chronic stable angina Hold aspirin Resume Imdur  Hypertension BP stable Continue Coreg and diltiazem Hold chlorthalidone, hydralazine, amlodipine Resume Imdur          Subjective: Feels much weaker today, with more diarrhea.  Electrolytes replaced.  Likely plan for SNF on 12/04/2023     Physical Exam: BP 130/62 (BP Location: Right Arm)   Pulse 83   Temp 98.3 F (36.8 C) (Oral)   Resp 16   Ht 5\' 6"  (1.676 m)   Wt 53.5 kg   LMP 10/07/1974   SpO2 98%   BMI 19.04 kg/m   General: NAD, frail, chronically ill-appearing Cardiovascular: S1, S2 present Respiratory: CTAB Abdomen: Soft, nontender, nondistended, bowel sounds present Musculoskeletal: No bilateral pedal edema noted Skin: Normal Psychiatry: Fair mood       Family Communication: Discussed with sister at bedside   Disposition: Status is: Inpatient Plan to DC to SNF on 2/27 if continues to be medically stable      Author: Briant Cedar, MD 12/03/2023 6:18 PM  For on call review [email or web address].

## 2023-12-04 DIAGNOSIS — E785 Hyperlipidemia, unspecified: Secondary | ICD-10-CM | POA: Diagnosis not present

## 2023-12-04 DIAGNOSIS — I10 Essential (primary) hypertension: Secondary | ICD-10-CM | POA: Diagnosis not present

## 2023-12-04 DIAGNOSIS — D61810 Antineoplastic chemotherapy induced pancytopenia: Secondary | ICD-10-CM | POA: Diagnosis not present

## 2023-12-04 DIAGNOSIS — D630 Anemia in neoplastic disease: Secondary | ICD-10-CM | POA: Diagnosis not present

## 2023-12-04 DIAGNOSIS — Z743 Need for continuous supervision: Secondary | ICD-10-CM | POA: Diagnosis not present

## 2023-12-04 DIAGNOSIS — Z8543 Personal history of malignant neoplasm of ovary: Secondary | ICD-10-CM | POA: Diagnosis not present

## 2023-12-04 DIAGNOSIS — I4891 Unspecified atrial fibrillation: Secondary | ICD-10-CM | POA: Diagnosis not present

## 2023-12-04 DIAGNOSIS — R079 Chest pain, unspecified: Secondary | ICD-10-CM | POA: Diagnosis not present

## 2023-12-04 DIAGNOSIS — R2689 Other abnormalities of gait and mobility: Secondary | ICD-10-CM | POA: Diagnosis not present

## 2023-12-04 DIAGNOSIS — N1832 Chronic kidney disease, stage 3b: Secondary | ICD-10-CM | POA: Diagnosis not present

## 2023-12-04 DIAGNOSIS — Z515 Encounter for palliative care: Secondary | ICD-10-CM | POA: Diagnosis not present

## 2023-12-04 DIAGNOSIS — C50919 Malignant neoplasm of unspecified site of unspecified female breast: Secondary | ICD-10-CM | POA: Diagnosis not present

## 2023-12-04 DIAGNOSIS — D61818 Other pancytopenia: Secondary | ICD-10-CM | POA: Diagnosis not present

## 2023-12-04 DIAGNOSIS — R918 Other nonspecific abnormal finding of lung field: Secondary | ICD-10-CM | POA: Diagnosis not present

## 2023-12-04 DIAGNOSIS — Z79899 Other long term (current) drug therapy: Secondary | ICD-10-CM | POA: Diagnosis not present

## 2023-12-04 DIAGNOSIS — R627 Adult failure to thrive: Secondary | ICD-10-CM | POA: Diagnosis not present

## 2023-12-04 DIAGNOSIS — N179 Acute kidney failure, unspecified: Secondary | ICD-10-CM | POA: Diagnosis not present

## 2023-12-04 DIAGNOSIS — R531 Weakness: Secondary | ICD-10-CM | POA: Diagnosis not present

## 2023-12-04 DIAGNOSIS — G893 Neoplasm related pain (acute) (chronic): Secondary | ICD-10-CM | POA: Diagnosis not present

## 2023-12-04 DIAGNOSIS — C7951 Secondary malignant neoplasm of bone: Secondary | ICD-10-CM | POA: Diagnosis not present

## 2023-12-04 DIAGNOSIS — L89322 Pressure ulcer of left buttock, stage 2: Secondary | ICD-10-CM | POA: Diagnosis not present

## 2023-12-04 DIAGNOSIS — Z7189 Other specified counseling: Secondary | ICD-10-CM | POA: Diagnosis not present

## 2023-12-04 DIAGNOSIS — E43 Unspecified severe protein-calorie malnutrition: Secondary | ICD-10-CM | POA: Diagnosis not present

## 2023-12-04 DIAGNOSIS — I480 Paroxysmal atrial fibrillation: Secondary | ICD-10-CM | POA: Diagnosis not present

## 2023-12-04 DIAGNOSIS — R0602 Shortness of breath: Secondary | ICD-10-CM | POA: Diagnosis not present

## 2023-12-04 DIAGNOSIS — E872 Acidosis, unspecified: Secondary | ICD-10-CM | POA: Diagnosis not present

## 2023-12-04 DIAGNOSIS — E871 Hypo-osmolality and hyponatremia: Secondary | ICD-10-CM | POA: Diagnosis not present

## 2023-12-04 DIAGNOSIS — R0689 Other abnormalities of breathing: Secondary | ICD-10-CM | POA: Diagnosis not present

## 2023-12-04 DIAGNOSIS — C50412 Malignant neoplasm of upper-outer quadrant of left female breast: Secondary | ICD-10-CM | POA: Diagnosis not present

## 2023-12-04 DIAGNOSIS — Z85828 Personal history of other malignant neoplasm of skin: Secondary | ICD-10-CM | POA: Diagnosis not present

## 2023-12-04 DIAGNOSIS — D696 Thrombocytopenia, unspecified: Secondary | ICD-10-CM | POA: Diagnosis not present

## 2023-12-04 DIAGNOSIS — D473 Essential (hemorrhagic) thrombocythemia: Secondary | ICD-10-CM | POA: Diagnosis not present

## 2023-12-04 DIAGNOSIS — J90 Pleural effusion, not elsewhere classified: Secondary | ICD-10-CM | POA: Diagnosis not present

## 2023-12-04 DIAGNOSIS — R000 Tachycardia, unspecified: Secondary | ICD-10-CM | POA: Diagnosis not present

## 2023-12-04 DIAGNOSIS — R0989 Other specified symptoms and signs involving the circulatory and respiratory systems: Secondary | ICD-10-CM | POA: Diagnosis not present

## 2023-12-04 DIAGNOSIS — Z170 Estrogen receptor positive status [ER+]: Secondary | ICD-10-CM | POA: Diagnosis not present

## 2023-12-04 DIAGNOSIS — E782 Mixed hyperlipidemia: Secondary | ICD-10-CM | POA: Diagnosis not present

## 2023-12-04 DIAGNOSIS — M4854XA Collapsed vertebra, not elsewhere classified, thoracic region, initial encounter for fracture: Secondary | ICD-10-CM | POA: Diagnosis not present

## 2023-12-04 DIAGNOSIS — Z86000 Personal history of in-situ neoplasm of breast: Secondary | ICD-10-CM | POA: Diagnosis not present

## 2023-12-04 DIAGNOSIS — Z66 Do not resuscitate: Secondary | ICD-10-CM | POA: Diagnosis not present

## 2023-12-04 DIAGNOSIS — R1313 Dysphagia, pharyngeal phase: Secondary | ICD-10-CM | POA: Diagnosis not present

## 2023-12-04 DIAGNOSIS — I129 Hypertensive chronic kidney disease with stage 1 through stage 4 chronic kidney disease, or unspecified chronic kidney disease: Secondary | ICD-10-CM | POA: Diagnosis not present

## 2023-12-04 DIAGNOSIS — Z681 Body mass index (BMI) 19 or less, adult: Secondary | ICD-10-CM | POA: Diagnosis not present

## 2023-12-04 DIAGNOSIS — I2510 Atherosclerotic heart disease of native coronary artery without angina pectoris: Secondary | ICD-10-CM | POA: Diagnosis not present

## 2023-12-04 DIAGNOSIS — R2681 Unsteadiness on feet: Secondary | ICD-10-CM | POA: Diagnosis not present

## 2023-12-04 DIAGNOSIS — R1310 Dysphagia, unspecified: Secondary | ICD-10-CM | POA: Diagnosis not present

## 2023-12-04 DIAGNOSIS — K219 Gastro-esophageal reflux disease without esophagitis: Secondary | ICD-10-CM | POA: Diagnosis not present

## 2023-12-04 DIAGNOSIS — C787 Secondary malignant neoplasm of liver and intrahepatic bile duct: Secondary | ICD-10-CM | POA: Diagnosis not present

## 2023-12-04 DIAGNOSIS — M6281 Muscle weakness (generalized): Secondary | ICD-10-CM | POA: Diagnosis not present

## 2023-12-04 DIAGNOSIS — I2489 Other forms of acute ischemic heart disease: Secondary | ICD-10-CM | POA: Diagnosis not present

## 2023-12-04 DIAGNOSIS — D649 Anemia, unspecified: Secondary | ICD-10-CM | POA: Diagnosis not present

## 2023-12-04 DIAGNOSIS — I422 Other hypertrophic cardiomyopathy: Secondary | ICD-10-CM | POA: Diagnosis not present

## 2023-12-04 LAB — CBC WITH DIFFERENTIAL/PLATELET
Abs Immature Granulocytes: 0.17 10*3/uL — ABNORMAL HIGH (ref 0.00–0.07)
Basophils Absolute: 0 10*3/uL (ref 0.0–0.1)
Basophils Relative: 1 %
Eosinophils Absolute: 0 10*3/uL (ref 0.0–0.5)
Eosinophils Relative: 1 %
HCT: 24.6 % — ABNORMAL LOW (ref 36.0–46.0)
Hemoglobin: 8.1 g/dL — ABNORMAL LOW (ref 12.0–15.0)
Immature Granulocytes: 5 %
Lymphocytes Relative: 6 %
Lymphs Abs: 0.2 10*3/uL — ABNORMAL LOW (ref 0.7–4.0)
MCH: 33.6 pg (ref 26.0–34.0)
MCHC: 32.9 g/dL (ref 30.0–36.0)
MCV: 102.1 fL — ABNORMAL HIGH (ref 80.0–100.0)
Monocytes Absolute: 0.2 10*3/uL (ref 0.1–1.0)
Monocytes Relative: 6 %
Neutro Abs: 2.6 10*3/uL (ref 1.7–7.7)
Neutrophils Relative %: 81 %
Platelets: 83 10*3/uL — ABNORMAL LOW (ref 150–400)
RBC: 2.41 MIL/uL — ABNORMAL LOW (ref 3.87–5.11)
RDW: 17.2 % — ABNORMAL HIGH (ref 11.5–15.5)
WBC: 3.2 10*3/uL — ABNORMAL LOW (ref 4.0–10.5)
nRBC: 0 % (ref 0.0–0.2)

## 2023-12-04 LAB — BASIC METABOLIC PANEL
Anion gap: 6 (ref 5–15)
BUN: 33 mg/dL — ABNORMAL HIGH (ref 8–23)
CO2: 18 mmol/L — ABNORMAL LOW (ref 22–32)
Calcium: 7.6 mg/dL — ABNORMAL LOW (ref 8.9–10.3)
Chloride: 111 mmol/L (ref 98–111)
Creatinine, Ser: 1.21 mg/dL — ABNORMAL HIGH (ref 0.44–1.00)
GFR, Estimated: 43 mL/min — ABNORMAL LOW (ref >60)
Glucose, Bld: 128 mg/dL — ABNORMAL HIGH (ref 70–99)
Potassium: 3.9 mmol/L (ref 3.5–5.1)
Sodium: 135 mmol/L (ref 135–145)

## 2023-12-04 LAB — MAGNESIUM: Magnesium: 2.3 mg/dL (ref 1.7–2.4)

## 2023-12-04 MED ORDER — SUCRALFATE 1 GM/10ML PO SUSP: 1.0000 g | Freq: Three times a day (TID) | ORAL | Status: DC

## 2023-12-04 MED ORDER — DILTIAZEM HCL ER 90 MG PO CP12: 90.0000 mg | ORAL_CAPSULE | Freq: Two times a day (BID) | ORAL | Status: DC

## 2023-12-04 MED ORDER — PANTOPRAZOLE SODIUM 20 MG PO TBEC: 20.0000 mg | DELAYED_RELEASE_TABLET | Freq: Every day | ORAL | Status: DC

## 2023-12-04 MED ORDER — LOPERAMIDE HCL 2 MG PO CAPS: 2.0000 mg | ORAL_CAPSULE | ORAL | Status: DC | PRN

## 2023-12-04 MED ORDER — ENSURE ENLIVE PO LIQD: 237.0000 mL | Freq: Three times a day (TID) | ORAL | Status: DC

## 2023-12-04 MED ORDER — ZINC OXIDE 40 % EX OINT: TOPICAL_OINTMENT | Freq: Four times a day (QID) | CUTANEOUS | Status: DC | PRN

## 2023-12-04 MED ORDER — OXYCODONE HCL 5 MG PO TABS
5.0000 mg | ORAL_TABLET | ORAL | 0 refills | Status: DC | PRN
Start: 2023-12-04 — End: 2023-12-09

## 2023-12-04 MED ORDER — OXYCODONE HCL 5 MG PO TABS: 2.5000 mg | ORAL_TABLET | Freq: Three times a day (TID) | ORAL | Status: DC

## 2023-12-04 MED ORDER — SONAFINE EX EMUL: 1.0000 | Freq: Two times a day (BID) | CUTANEOUS | Status: DC | PRN

## 2023-12-04 NOTE — Discharge Summary (Addendum)
 Physician Discharge Summary   Patient: [name] MRN: [id number] DOB: 1934-05-06  Admit date:     11/21/2023  Discharge date: 12/04/23  Discharge Physician: Briant Cedar   PCP: Lorenda Ishihara, MD   Recommendations at discharge:   Follow-up with PCP Follow-up with cardiology as scheduled Follow-up with oncology as scheduled  Discharge Diagnoses: Principal Problem:   AKI (acute kidney injury) (HCC) Active Problems:   Hypertension   CAD (coronary artery disease)   Carcinoma of upper-outer quadrant of left breast in female, estrogen receptor positive (HCC)   New onset a-fib (HCC)   Hypertrophic cardiomyopathy (HCC)   Demand ischemia (HCC)   Hypokalemia   Failure to thrive   Dysphagia   Counseling and coordination of care   Odynophagia   Palliative care encounter   Protein-calorie malnutrition, severe       Hospital Course: 89 y.o. 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 Sent to ER from clinic due to creatinine 3.0.  In the hospital, noted to have Afib RVR.  Cardiology consulted. AKI treated with fluids and now resolved to baseline. Cardiology consulted and recommended rate control strategy only due to complex advanced oncological disease. Had persistent chest discomfort with swallowing, SLP eval benign.  Esophargam showed dysmotility and stenosis.  Palliative care consulted.    Today, patient denies any new complaints.  Stable to transfer to SNF for rehab.  Follow-up with PCP, oncology, cardiology as scheduled.   Assessment and Plan:  AKI (acute kidney injury) (HCC) Cr 3 on admission, treated with fluids and Cr improved to baseline, now stable.   Discontinue home chlorthalidone Avoid hypotension, nephrotoxins   Noted chronic diarrhea Has been ongoing, denies any abdominal pain, nausea/vomiting Possibly likely 2/2 chemo/radiation Imodium as needed    Hypokalemia/hypomagnesemia Likely 2/2 above Continue supplemental K+   Dysphagia 2/2 dysmotility and stenosis Esophagram showed above Soft diet SLP consulted Continue liquid Carafate, unable to tolerate lidocaine, continue oxycodone   Failure to thrive Severe protein calorie malnutrition Continue nutrition supplements Consult dietitian   New onset a-fib (HCC) Paroxysmal atrial fibrillation with RVR Rate now controlled Cardiology consulted, they recommended avoiding ablation, cardioversion, recommended rate control strategy Discussion of anticoagulation with family, this was declined Continue diltiazem and Coreg   Pancytopenia with anemia, thrombocytopenia and leukopenia Discussed with hematology, this is likely due to her Verzenio, and probably bone marrow infiltration. Supportive care   Hypertrophic cardiomyopathy (HCC) Hard to know if her dyspnea/CP are partially related to OT obstruction. Cardiology were consulted and recommended against invasive testing, cMRI.   Carcinoma of upper-outer quadrant of left breast in female, estrogen receptor positive (HCC) Metastases to liver and spine Verzenio currently on hold Follow up with Oncology, Radiation oncology, Palliative Care   CAD (coronary artery disease) With chronic stable angina Continue aspirin, Imdur   Hypertension BP stable Continue Coreg, diltiazem, imdur, adjust BP meds pending BP Discontinued chlorthalidone, hydralazine, amlodipine  GOC discussion Overall poor prognosis Palliative care consulted and will follow-up as outpatient      Pain control - [place] Controlled Substance Reporting System database was reviewed. and patient was instructed, not to drive, operate heavy machinery, perform activities at heights, swimming or participation in water activities or provide baby-sitting services while on Pain, Sleep and Anxiety Medications; until their outpatient Physician has advised to do so again. Also  recommended to not to take more than prescribed Pain, Sleep and Anxiety Medications.   Consultants: Oncology, cardiology, palliative  Procedures performed: None Disposition: Skilled nursing facility Diet recommendation: Soft diet    DISCHARGE MEDICATION: Allergies as of 12/04/2023       Reactions   Ramipril Itching        Medication List     PAUSE taking these medications    Verzenio 100 MG tablet Wait to take this until your doctor or other care provider tells you to start again. Generic drug: abemaciclib Take 1 tablet (100 mg total) by mouth 2 (two) times daily.       STOP taking these medications    amLODipine 10 MG tablet Commonly known as: NORVASC   chlorthalidone 25 MG tablet Commonly known as: HYGROTON   hydrALAZINE 10 MG tablet Commonly known as: APRESOLINE       TAKE these medications    aspirin EC 81 MG tablet Take 81 mg by mouth daily.   Belsomra 10 MG Tabs Generic drug: Suvorexant Take 10 mg by mouth at bedtime.   CALTRATE 600+D PO Take 1 tablet by mouth 2 (two) times daily.   carvedilol 25 MG tablet Commonly known as: COREG Take 1 tablet (25 mg total) by mouth 2 (two) times daily with a meal.   cyanocobalamin 1000 MCG tablet Commonly known as: VITAMIN B12 Take 1,000 mcg by mouth daily.   dexamethasone 4 MG tablet Commonly known as: DECADRON Take 1 tab (4 mg) twice a day for 3 days then take 1 tab daily What changed:  how much to take how to take this when to take this additional instructions   diltiazem 90 MG 12 hr capsule Commonly known as: CARDIZEM SR Take 1 capsule (90 mg total) by mouth every 12 (twelve) hours.   feeding supplement Liqd Take 237 mLs by mouth 3 (three) times daily between meals.   hydroxychloroquine 200 MG tablet Commonly known as: PLAQUENIL Take 200 mg by mouth daily.   hydroxyurea 500 MG capsule Commonly known as: HYDREA TAKE 1 CAPSULE BY MOUTH EVERY MONDAY, WEDNESDAY, AND FRIDAY. MAY TAKE WITH  FOOD TO MINIMIZE GI SIDE EFFECTS   isosorbide mononitrate 60 MG 24 hr tablet Commonly known as: IMDUR Take 1 tablet (60 mg total) by mouth daily.   liver oil-zinc oxide 40 % ointment Commonly known as: DESITIN Apply topically 4 (four) times daily as needed for irritation.   loperamide 2 MG capsule Commonly known as: IMODIUM Take 1 capsule (2 mg total) by mouth as needed for diarrhea or loose stools.   Multivitamin Women 50+ Tabs Take 1 tablet by mouth daily with breakfast.   oxyCODONE 5 MG immediate release tablet Commonly known as: Oxy IR/ROXICODONE Take 1 tablet (5 mg total) by mouth every 4 (four) hours as needed for up to 5 days for severe pain (pain score 7-10) or moderate pain (pain score 4-6). May take 2 tablets if pain is severe.  Do not drive while taking pain medication.   pantoprazole 20 MG tablet Commonly known as: PROTONIX Take 1 tablet (20 mg total) by mouth daily. Start taking on: December 05, 2023   potassium chloride 10 MEQ tablet Commonly known as: KLOR-CON M Take 1 tablet (10 meq) twice daily for 3 days, then 1 tablet daily. What changed:  how much to take how to take this when to take this additional instructions   Proctozone-HC 2.5 % rectal cream Generic drug: hydrocortisone PLACE 1 APPLICATION RECTALLY 2 TIMES A DAY AS NEEDED FOR HEMORRHOIDS. What changed: See the new instructions.   rosuvastatin 10 MG tablet Commonly known as:  CRESTOR Take 10 mg by mouth every other day.   Sonafine Apply 1 Application topically 2 (two) times daily as needed (Apply to areas of redness from radiation as needed. Call Dr. Colletta [place] nurse at [phone] if you have any questions).   sucralfate 1 GM/10ML suspension Commonly known as: CARAFATE Take 10 mLs (1 g total) by mouth 4 (four) times daily -  with meals and at bedtime.   tiZANidine 4 MG tablet Commonly known as: ZANAFLEX Take 4 mg by mouth 2 (two) times daily as needed for muscle spasms.   triamcinolone  cream 0.1 % Commonly known as: KENALOG Apply 1 application  topically as needed (for rashes- affected areas).   Vitamin D3 50 MCG (2000 UT) Tabs Take 2,000 Units by mouth in the morning and at bedtime.        Contact information for follow-up providers     Dunn, Kriste Basque N, PA-C Follow up.   Specialties: Cardiology, Radiology Why: [facility] - [address] location - cardiology follow-up arranged with PA Dayna on Tuesday Dec 23, 2023 at 2:20 PM (Arrive by 2:05 PM). Contact information: [address] [place] [address] [phone]         Lorenda Ishihara, MD Follow up.   Specialty: Internal Medicine Contact information: [address] [place] [address] [phone]              Contact information for after-discharge care     Destination     [facility] Preferred SNF .   Service: Skilled Nursing Contact information: [address] [place] [address] [phone]                    Discharge Exam: Ceasar Mons Weights   11/21/23 1525  Weight: 53.5 kg   General: NAD, chronically ill appearing, frail   Cardiovascular: S1, S2 present Respiratory: CTAB Abdomen: Soft, nontender, nondistended, bowel sounds present Musculoskeletal: No bilateral pedal edema noted Skin: Normal Psychiatry: Normal mood   Condition at discharge: fair  The results of significant diagnostics from this hospitalization (including imaging, microbiology, ancillary and laboratory) are listed below for reference.   Imaging Studies: DG ESOPHAGUS W SINGLE CM (SOL OR THIN BA) Result Date: 12/01/2023 CLINICAL DATA:  Dysphagia, pain during swallowing. EXAM: ESOPHOGRAM/BARIUM SWALLOW TECHNIQUE: Single contrast examination was performed using  thin barium. FLUOROSCOPY: Radiation Exposure Index (as provided by the fluoroscopic device): 7.0 mGy Kerma COMPARISON:  None Available. FINDINGS: Limited assessment of  swallowing due to kyphosis. Poor esophageal motility with stasis of contrast in the esophagus. Gastroesophageal junction does expand somewhat during swallowing but a 13 mm barium tablet would not pass into the stomach. IMPRESSION: 1. Slight narrowing at the gastroesophageal junction, through which a 13 mm barium tablet would not pass. 2. Esophageal dysmotility. Electronically Signed   By: Leanna Battles M.D.   On: 12/01/2023 09:00   ECHOCARDIOGRAM COMPLETE Result Date: 11/25/2023    ECHOCARDIOGRAM REPORT   Patient Name:   TOM RAGSDALE Date of Exam: 11/25/2023 Medical Rec #:  [id number]        Height:       66.0 in Accession #:    [id number]       Weight:       117.9 lb Date of Birth:  01/07/1934       BSA:          1.598 m Patient Age:    40 years  BP:           144/56 mmHg Patient Gender: F                HR:           88 bpm. Exam Location:  Inpatient Procedure: 2D Echo, Cardiac Doppler and Color Doppler (Both Spectral and Color            Flow Doppler were utilized during procedure). Indications:    Chest Pain R07.9  History:        Patient has prior history of Echocardiogram examinations, most                 recent 03/22/2022. CAD; Risk Factors:Hypertension.  Sonographer:    Webb Laws Referring Phys: [id number] SUNIT TOLIA IMPRESSIONS  1. Severe basal septal hypertrophy on off axis imaging; maximal septal thickness 20 mm. Maximal apical hypertrophy 20 mm. No resting gradient noted. Left ventricular ejection fraction, by estimation, is 65 to 70%. The left ventricle has normal function.  The left ventricle has no regional wall motion abnormalities. There is severe asymmetric left ventricular hypertrophy of the basal-septal segment. Left ventricular diastolic function could not be evaluated.  2. Right ventricular systolic function is normal. The right ventricular size is normal.  3. Left atrial size was mildly dilated.  4. The mitral valve is normal in structure. No evidence of mitral valve  regurgitation. No evidence of mitral stenosis. The mean mitral valve gradient is 2.0 mmHg. Moderate mitral annular calcification.  5. The aortic valve is tricuspid. There is mild calcification of the aortic valve. Aortic valve regurgitation is mild. No aortic stenosis is present.  6. The inferior vena cava is normal in size with greater than 50% respiratory variability, suggesting right atrial pressure of 3 mmHg. Comparison(s): Prior images reviewed side by side. Aortic regurgitation not seen in 2023 study. Maximal basal septal thickness 14 mm on better quality long axis view. Apex not well seen in 2023 study. Conclusion(s)/Recommendation(s): Consider outpatient CMR for clarification of hypertrophy if clinically indicated. FINDINGS  Left Ventricle: Severe basal septal hypertrophy on off axis imaging; maximal septal thickness 20 mm. Maximal apical hypertrophy 20 mm. No resting gradient noted. Left ventricular ejection fraction, by estimation, is 65 to 70%. The left ventricle has normal function. The left ventricle has no regional wall motion abnormalities. Global longitudinal strain performed but not reported based on interpreter judgement due to suboptimal tracking. The left ventricular internal cavity size was normal in size. There is severe asymmetric left ventricular hypertrophy of the basal-septal segment. Left ventricular diastolic function could not be evaluated due to mitral annular calcification (moderate or greater). Left ventricular diastolic function could not be evaluated. Right Ventricle: The right ventricular size is normal. No increase in right ventricular wall thickness. Right ventricular systolic function is normal. Left Atrium: Left atrial size was mildly dilated. Right Atrium: Right atrial size was normal in size. Pericardium: There is no evidence of pericardial effusion. Mitral Valve: The mitral valve is normal in structure. Moderate mitral annular calcification. No evidence of mitral valve  regurgitation. No evidence of mitral valve stenosis. The mean mitral valve gradient is 2.0 mmHg. Tricuspid Valve: The tricuspid valve is normal in structure. Tricuspid valve regurgitation is mild . No evidence of tricuspid stenosis. Aortic Valve: The aortic valve is tricuspid. There is mild calcification of the aortic valve. Aortic valve regurgitation is mild. No aortic stenosis is present. Pulmonic Valve: The pulmonic valve was not well visualized. Pulmonic valve regurgitation is  not visualized. No evidence of pulmonic stenosis. Aorta: The aortic root and ascending aorta are structurally normal, with no evidence of dilitation. Venous: The inferior vena cava is normal in size with greater than 50% respiratory variability, suggesting right atrial pressure of 3 mmHg. IAS/Shunts: The atrial septum is grossly normal. Additional Comments: 3D imaging was not performed.  LEFT VENTRICLE PLAX 2D LVIDd:         4.00 cm   Diastology LVIDs:         3.20 cm   LV e' medial:    5.11 cm/s LV PW:         1.25 cm   LV E/e' medial:  21.7 LV IVS:        1.10 cm   LV e' lateral:   9.03 cm/s LVOT diam:     1.70 cm   LV E/e' lateral: 12.3 LV SV:         51 LV SV Index:   32 LVOT Area:     2.27 cm  RIGHT VENTRICLE             IVC RV Basal diam:  3.00 cm     IVC diam: 1.40 cm RV S prime:     11.60 cm/s TAPSE (M-mode): 1.5 cm LEFT ATRIUM             Index        RIGHT ATRIUM          Index LA diam:        4.50 cm 2.82 cm/m   RA Area:     7.16 cm LA Vol (A2C):   54.4 ml 34.05 ml/m  RA Volume:   13.10 ml 8.20 ml/m LA Vol (A4C):   47.2 ml 29.54 ml/m LA Biplane Vol: 54.4 ml 34.05 ml/m  AORTIC VALVE LVOT Vmax:   145.50 cm/s LVOT Vmean:  89.250 cm/s LVOT VTI:    0.224 m  AORTA Ao Root diam: 2.70 cm Ao Asc diam:  3.10 cm MITRAL VALVE                TRICUSPID VALVE MV Area (PHT): 4.15 cm     TR Peak grad:   25.0 mmHg MV Mean grad:  2.0 mmHg     TR Vmax:        250.00 cm/s MV Decel Time: 183 msec MV E velocity: 111.00 cm/s  SHUNTS                              Systemic VTI:  0.22 m                             Systemic Diam: 1.70 cm Riley Lam MD Electronically signed by Riley Lam MD Signature Date/Time: 11/25/2023/9:28:52 AM    Final    CT ABDOMEN PELVIS WO CONTRAST Result Date: 11/23/2023 CLINICAL DATA:  Metastatic breast cancer. Acute abdominal pain. Acute kidney injury. * Tracking Code: BO * EXAM: CT ABDOMEN AND PELVIS WITHOUT CONTRAST TECHNIQUE: Multidetector CT imaging of the abdomen and pelvis was performed following the standard protocol without IV contrast. RADIATION DOSE REDUCTION: This exam was performed according to the departmental dose-optimization program which includes automated exposure control, adjustment of the mA and/or kV according to patient size and/or use of iterative reconstruction technique. COMPARISON:  10/16/2023 FINDINGS: Lower chest: Right coronary artery descending thoracic aortic atheromatous vascular calcification. Trace pericardial effusion.  Mitral valve calcification. Small bilateral pleural effusions with passive atelectasis. Small type 1 hiatal hernia. Scattered small pulmonary nodules in the lung bases as on 10/16/2023, probably from metastatic disease. Hepatobiliary: Scattered hepatic hypodense lesions compatible with metastatic disease. A confluence of two lesions posteriorly in the right hepatic lobe measures 4.1 by 2.2 cm on image 8 series 2. A medial hypodense lesion posteriorly in the right hepatic lobe measures 4.4 by 3.1 cm on image 10 series 2, formerly about 4.0 by 2.7 cm. Cholecystectomy. Distal common bile duct proximally 9 mm in diameter, borderline prominent for age. Pancreas: Atrophic, otherwise unremarkable. Spleen: Unremarkable Adrenals/Urinary Tract: 2.0 by 2.0 cm right adrenal mass, internal density 34 Hounsfield units which is indeterminate. Bilateral nonobstructive renal calculi. Mild right hydronephrosis and proximal hydroureter extending to the iliac vessel cross over,  however with no distal hydroureter. Foley catheter in the urinary bladder which is mostly empty although there is a small amount of gas in the lumen of the urinary bladder. Stomach/Bowel: Small periampullary duodenal diverticulum. Large caliber distal colon and rectum containing progressive dilution of contrast medium as well as stool contents. Prior bowel resections include the right colon and at least part of the ileum. Vascular/Lymphatic: Atherosclerosis is present, including aortoiliac atherosclerotic disease. Reproductive: Uterus absent.  Adnexa unremarkable. Other: No supplemental non-categorized findings. Musculoskeletal: We partially include what appears to be a compression fracture and possible lytic lesions involving the T11 vertebral body. Suspected right eleventh rib fracture medially. Lucency eccentric to the left in the T12 vertebral body may be from metastatic disease. Bony destructive findings in the posterior elements at L4 suspicious for osseous metastatic disease. Grade 1 anterolisthesis at L5-S1. Lucency in the left iliac wing on image 53 series 4 is probably a metastatic lesion. There is also lucency further medially in the left iliac bone on image 77 series 4. IMPRESSION: 1. Mild right hydronephrosis and proximal hydroureter extending to the iliac vessel cross over, however with no distal hydroureter. This could be from a distal ureteral stricture or from a recently passed calculus. 2. Bilateral nonobstructive renal calculi. 3. Scattered hepatic hypodense lesions compatible with metastatic disease. 4. Scattered small pulmonary nodules in the lung bases as on 10/16/2023, probably from metastatic disease. 5. 2.0 by 2.0 cm right adrenal mass, internal density 34 Hounsfield units which is indeterminate but quite likely metastatic. 6. We partially include what appears to be a compression fracture and possible lytic lesions involving the T11 vertebral body. Suspected right eleventh rib fracture  medially. Bony destructive findings in the posterior elements at L4 suspicious for osseous metastatic disease. Left iliac lytic lesions suspicious for metastatic disease. 7. Small bilateral pleural effusions with passive atelectasis. 8. Small type 1 hiatal hernia. 9. Aortic and coronary atherosclerosis. Aortic Atherosclerosis (ICD10-I70.0). Electronically Signed   By: Gaylyn Rong M.D.   On: 11/23/2023 17:51   US RENAL Result Date: 11/21/2023 CLINICAL DATA:  Acute kidney injury. EXAM: RENAL / URINARY TRACT ULTRASOUND COMPLETE COMPARISON:  CT 10/16/2023 FINDINGS: Right Kidney: Renal measurements: 10.1 x 4.4 x 4.7 cm = volume: 110 mL. Moderate hydronephrosis is new from prior CT. Mild increased renal parenchymal echogenicity typical of chronic renal disease. Renal calculi on prior CT not well-defined on the current exam. No evidence of focal lesion. Left Kidney: Renal measurements: 9 x 4.1 x 4.3 cm = volume: 83 mL. No hydronephrosis. Thinning of renal parenchyma with increased echogenicity. Shadowing 9 mm stone. Small renal cysts, largest 1.7 cm. No evidence of suspicious lesion. Bladder: Appears normal  for degree of bladder distention. Other: None. IMPRESSION: 1. Moderate right hydronephrosis is new from prior CT. 2. Increased renal parenchymal echogenicity typical of chronic medical renal disease. 3. Left renal stone. Known right renal calculi on prior CT not well seen. Electronically Signed   By: Narda Rutherford M.D.   On: 11/21/2023 22:06    Microbiology: Results for orders placed or performed during the hospital encounter of 02/01/22  Blood culture (routine x 2)     Status: None   Collection Time: 02/02/22  1:53 AM   Specimen: BLOOD  Result Value Ref Range Status   Specimen Description BLOOD RIGHT ANTECUBITAL  Final   Special Requests   Final    BOTTLES DRAWN AEROBIC AND ANAEROBIC Blood Culture results may not be optimal due to an excessive volume of blood received in culture bottles    Culture   Final    NO GROWTH 5 DAYS Performed at [facility], [address], [place], [place] [address]    Report Status 02/07/2022 FINAL  Final  Blood culture (routine x 2)     Status: None   Collection Time: 02/02/22  2:32 AM   Specimen: BLOOD  Result Value Ref Range Status   Specimen Description BLOOD SITE NOT SPECIFIED  Final   Special Requests   Final    BOTTLES DRAWN AEROBIC AND ANAEROBIC Blood Culture results may not be optimal due to an excessive volume of blood received in culture bottles   Culture   Final    NO GROWTH 5 DAYS Performed at [facility], [address], [place], [place] [address]    Report Status 02/07/2022 FINAL  Final  Surgical pcr screen     Status: None   Collection Time: 02/02/22  7:10 AM   Specimen: Nasal Mucosa; Nasal Swab  Result Value Ref Range Status   MRSA, PCR NEGATIVE NEGATIVE Final   Staphylococcus aureus NEGATIVE NEGATIVE Final    Comment: (NOTE) The Xpert SA Assay (FDA approved for NASAL specimens in patients 16 years of age and older), is one component of a comprehensive surveillance program. It is not intended to diagnose infection nor to guide or monitor treatment. Performed at [facility], [address], [place], [place] [address]   Aerobic/Anaerobic Culture w Gram Stain (surgical/deep wound)     Status: None   Collection Time: 02/02/22  2:19 PM   Specimen: PATH Other; Tissue  Result Value Ref Range Status   Specimen Description WOUND  Final   Special Requests RIGHT HAND ABSCESS  Final   Gram Stain   Final    ABUNDANT WBC PRESENT, PREDOMINANTLY PMN NO ORGANISMS SEEN    Culture   Final    RARE PASTEURELLA MULTOCIDA Usually susceptible to penicillin and other beta lactam agents,quinolones,macrolides and tetracyclines. NO ANAEROBES ISOLATED Performed at [facility], [address], [place], [place] [address]    Report Status 02/07/2022 FINAL  Final    Labs: CBC: Recent Labs  Lab 11/30/23 1310  12/01/23 0505 12/02/23 0501 12/03/23 0614 12/04/23 0604  WBC 4.0 3.5* 3.5* 3.3* 3.2*  NEUTROABS 3.6  --   --   --  2.6  HGB 7.8* 7.5* 8.0* 7.8* 8.1*  HCT 24.4* 22.4* 24.8* 24.1* 24.6*  MCV 103.8* 101.4* 103.3* 103.0* 102.1*  PLT 70* 68* 76* 74* 83*   Basic Metabolic Panel: Recent Labs  Lab 11/30/23 0537 12/01/23 0505 12/02/23 0501 12/03/23 0614 12/04/23 0604  NA 138 138 140 137  135  K 4.3 3.8 3.4* 2.9* 3.9  CL 112* 113* 113* 114* 111  CO2 20* 19* 19* 15* 18*  GLUCOSE 115* 108* 101* 104* 128*  BUN 35* 38* 35* 34* 33*  CREATININE 1.22* 1.36* 1.27* 1.15* 1.21*  CALCIUM 8.0* 7.7* 8.0* 7.5* 7.6*  MG  --   --   --  1.4* 2.3   Liver Function Tests: Recent Labs  Lab 11/30/23 0537  AST 15  ALT 65*  ALKPHOS 64  BILITOT 0.7  PROT 4.2*  ALBUMIN 2.3*   CBG: No results for input(s): "GLUCAP" in the last 168 hours.  Discharge time spent: greater than 30 minutes.  Signed: Briant Cedar, MD [facility] 12/04/2023

## 2023-12-04 NOTE — TOC Transition Note (Signed)
 Transition of Care [facility]) - Discharge Note   Patient Details  Name: [name] MRN: [id number] Date of Birth: 01-13-1934  Transition of Care [facility]) CM/SW Contact:  Beckie Busing, RN Phone Number:[phone]  12/04/2023, 1:27 PM   Clinical Narrative:    Patient with discharge orders. Transportation has been arranged per PTAR. Patient updated Sister at bedside updated. Discharge packet is at nurses station. No other TOC needs noted TOC will sign off.   Please call report to [facility] [phone] Room # 112   Final next level of care: Skilled Nursing Facility Barriers to Discharge: No Barriers Identified   Patient Goals and CMS Choice Patient states their goals for this hospitalization and ongoing recovery are:: Rehab CMS Medicare.gov Compare Post Acute Care list provided to:: Patient Choice offered to / list presented to : Patient [place] ownership interest in [facility].provided to:: Patient    Discharge Placement              Patient chooses bed at: [facility] Patient to be transferred to facility by: PTAR Name of family member notified: Warnell Bureau sister at bedside updated Patient and family notified of of transfer: 12/04/23  Discharge Plan and Services Additional resources added to the After Visit Summary for     Discharge Planning Services: CM Consult Post Acute Care Choice: Skilled Nursing Facility                    HH Arranged: NA HH Agency: NA        Social Drivers of Health (SDOH) Interventions SDOH Screenings   Food Insecurity: No Food Insecurity (11/21/2023)  Housing: Low Risk  (11/21/2023)  Transportation Needs: No Transportation Needs (11/21/2023)  Utilities: Not At Risk (11/21/2023)  Depression (PHQ2-9): Low Risk  (11/11/2023)  Social Connections: Moderately Integrated (11/21/2023)  Tobacco Use: Low Risk  (11/21/2023)     Readmission Risk Interventions    11/23/2023   11:56 AM  Readmission Risk  Prevention Plan  Transportation Screening Complete  PCP or Specialist Appt within 5-7 Days Complete  Home Care Screening Complete  Medication Review (RN CM) Complete

## 2023-12-04 NOTE — Progress Notes (Signed)
 AVS reviewed with PT all inquiries addressed. PT will D/C to [facility] departure time via transport TBD. PT has no complaints of pain.   Report given to [facility] Lance Bosch, RN. All inquiries addressed.

## 2023-12-04 NOTE — Progress Notes (Signed)
 Mobility Specialist - Progress Note   12/04/23 1032  Mobility  Activity Ambulated with assistance in hallway  Level of Assistance Contact guard assist, steadying assist  Assistive Device Front wheel walker  Distance Ambulated (ft) 40 ft  Activity Response Tolerated well  Mobility Referral Yes  Mobility visit 1 Mobility  Mobility Specialist Start Time (ACUTE ONLY) 1021  Mobility Specialist Stop Time (ACUTE ONLY) 1031  Mobility Specialist Time Calculation (min) (ACUTE ONLY) 10 min   Pt received in bed and agreeable to mobility. Pt fatigued w/ exertion. No complaints during session.  Pt to recliner after session with all needs met.    [facility]

## 2023-12-04 NOTE — TOC Transition Note (Signed)
 Transition of Care [facility]) - Discharge Note   Patient Details  Name: [name] MRN: [id number] Date of Birth: Feb 01, 1934  Transition of Care [facility]) CM/SW Contact:  Beckie Busing, RN Phone Number:[phone]  12/04/2023, 12:06 PM   Clinical Narrative:    Patient discharging to [facility]. D/c summary and transfer report faxed to [facility].    Final next level of care: Skilled Nursing Facility Barriers to Discharge: Continued Medical Work up   Patient Goals and CMS Choice Patient states their goals for this hospitalization and ongoing recovery are:: Rehab CMS Medicare.gov Compare Post Acute Care list provided to:: Patient Choice offered to / list presented to : Patient [place] ownership interest in [facility].provided to:: Patient    Discharge Placement                       Discharge Plan and Services Additional resources added to the After Visit Summary for     Discharge Planning Services: CM Consult Post Acute Care Choice: Skilled Nursing Facility                               Social Drivers of Health (SDOH) Interventions SDOH Screenings   Food Insecurity: No Food Insecurity (11/21/2023)  Housing: Low Risk  (11/21/2023)  Transportation Needs: No Transportation Needs (11/21/2023)  Utilities: Not At Risk (11/21/2023)  Depression (PHQ2-9): Low Risk  (11/11/2023)  Social Connections: Moderately Integrated (11/21/2023)  Tobacco Use: Low Risk  (11/21/2023)     Readmission Risk Interventions    11/23/2023   11:56 AM  Readmission Risk Prevention Plan  Transportation Screening Complete  PCP or Specialist Appt within 5-7 Days Complete  Home Care Screening Complete  Medication Review (RN CM) Complete

## 2023-12-04 NOTE — Progress Notes (Signed)
  Daily Progress Note   Patient Name: [name]       Date: 12/04/2023 DOB: 15-Aug-1934  Age: 89 y.o. MRN#: [id number] Attending Physician: Briant Cedar, MD Primary Care Physician: Lorenda Ishihara, MD Admit Date: 11/21/2023 Length of Stay: 13 days  Reason for Consultation/Follow-up: Establishing goals of care  Subjective:   CC: Patient sipping soup at time of visit.  Following up regarding complex medical decision making.  Subjective:  Reviewed EMR prior to presenting to bedside.  Had scheduled low-dose oxycodone yesterday evening before dinner.  Noted patient's oral intake documented at 500 mL yesterday after this increase.  Plan is for patient to go to rehab.  Presented to bedside to see patient.  Patient sitting up in bedside chair.  Patient notes currently she is feeling tired.  Patient does feel that the scheduled oral oxycodone assisted with pain management.  Noted can schedule low-dose prior to meals to assist with oral intake moving forward.  Patient agreed with this plan.  All questions answered at that time.  Noted palliative medicine team to continue following with patient's medical journey.  Objective:   Vital Signs:  BP (!) 132/55 (BP Location: Right Arm)   Pulse 87   Temp 98.2 F (36.8 C) (Oral)   Resp 16   Ht 5\' 6"  (1.676 m)   Wt 53.5 kg   LMP 10/07/1974   SpO2 98%   BMI 19.04 kg/m   Physical Exam: General: NAD, alert, frail, sitting in chair Cardiovascular: RRR Respiratory: no increased work of breathing noted, not in respiratory distress Neuro: A&Ox4, following commands easily Psych: appropriately answers all questions  Imaging: I personally reviewed recent imaging.   Assessment & Plan:   Assessment: Patient is a 89 year old female with a past medical history of metastatic breast cancer with disease to spine, liver, and peritoneum who was admitted on 11/21/2023 for management of dehydration and AKI.  During hospitalization patient has  received management for AKI, pancytopenia, new onset A-fib, and dysphagia.  During hospitalization patient underwent barium esophagram that showed esophageal dysmotility and a distal stricture.  GI consulted though unable to proceed with dilation due to thrombocytopenia.  Palliative medicine team consulted to assist with complex medical decision making.  Recommendations/Plan: # Complex medical decision making/goals of care:  - Patient continues discussions with oncology regarding cancer directed therapies moving forward.  Planning for outpatient follow-up with oncology.  -Currently planning for discharge to SNF rehab once medically appropriate with outpatient palliative follow-up.  -  Code Status: Limited: Do not attempt resuscitation (DNR) -DNR-LIMITED -Do Not Intubate/DNI   # Symptom management: Patient is receiving these palliative interventions for symptom management with an intent to improve quality of life.   -Odynophagia, in the setting of esophageal dysmotility with distal stricture   -Discontinued lidocaine viscus solution as patient did not like taste   -Continue Carafate scheduled 3 times daily and bedtime   -Start scheduled dose of oxycodone 2.5 mg 1 hour prior to meals  # Discharge Planning: Skilled Nursing Facility for rehab with Palliative care service follow-up   Discussed with: patient, RN, pharmacy  Thank you for allowing the palliative care team to participate in the care [name].  Alvester Morin, DO Palliative Care Provider PMT # [phone]  If patient remains symptomatic despite maximum doses, please call PMT at [phone] between 0700 and 1900. Outside of these hours, please call attending, as PMT does not have night coverage.

## 2023-12-04 NOTE — Radiation Completion Notes (Signed)
 Patient Name: [name], [name] MRN: [id number] Date of Birth: [birth year] Referring Physician: Lorenda Ishihara, M.D. Date of Service: 2023-12-04 Radiation Oncologist: Lonie Peak, M.D. [facility]                             RADIATION ONCOLOGY END OF TREATMENT NOTE     Diagnosis: C79.51 Secondary malignant neoplasm of bone Staging on 2019-05-17: Carcinoma of upper-outer quadrant of left breast in female, estrogen receptor positive (HCC) T=cT1c, N=cN0, M=cM0 Intent: Palliative     ==========DELIVERED PLANS==========  First Treatment Date: 2023-11-18 Last Treatment Date: 2023-12-03   Plan Name: Spine_T-L Site: Lumbar Spine Technique: 3D Mode: Photon Dose Per Fraction: 3 Gy Prescribed Dose (Delivered / Prescribed): 30 Gy / 30 Gy Prescribed Fxs (Delivered / Prescribed): 10 / 10     ==========ON TREATMENT VISIT DATES========== 2023-11-25, 2023-11-28, 2023-12-01     ==========UPCOMING VISITS==========       ==========APPENDIX - ON TREATMENT VISIT NOTES==========   See weekly On Treatment Notes in Epic for details in the Media tab (listed as Progress notes on the On Treatment Visit Dates listed above).

## 2023-12-04 NOTE — Plan of Care (Signed)
   Problem: Activity: Goal: Risk for activity intolerance will decrease Outcome: Progressing   Problem: Nutrition: Goal: Adequate nutrition will be maintained Outcome: Progressing   Problem: Elimination: Goal: Will not experience complications related to bowel motility Outcome: Progressing Goal: Will not experience complications related to urinary retention Outcome: Progressing

## 2023-12-05 ENCOUNTER — Emergency Department (HOSPITAL_COMMUNITY): Payer: Medicare Other

## 2023-12-05 ENCOUNTER — Other Ambulatory Visit: Payer: Self-pay

## 2023-12-05 ENCOUNTER — Ambulatory Visit: Payer: Medicare Other | Admitting: Nurse Practitioner

## 2023-12-05 ENCOUNTER — Inpatient Hospital Stay (HOSPITAL_COMMUNITY)
Admission: EM | Admit: 2023-12-05 | Discharge: 2023-12-09 | DRG: 308 | Disposition: A | Discharge status: Skilled Nursing Facility | Payer: Medicare Other | Source: Skilled Nursing Facility | Attending: Internal Medicine | Admitting: Internal Medicine

## 2023-12-05 ENCOUNTER — Ambulatory Visit: Payer: Medicare Other

## 2023-12-05 ENCOUNTER — Other Ambulatory Visit: Payer: Medicare Other

## 2023-12-05 DIAGNOSIS — E43 Unspecified severe protein-calorie malnutrition: Secondary | ICD-10-CM | POA: Diagnosis not present

## 2023-12-05 DIAGNOSIS — D630 Anemia in neoplastic disease: Secondary | ICD-10-CM | POA: Diagnosis present

## 2023-12-05 DIAGNOSIS — E871 Hypo-osmolality and hyponatremia: Secondary | ICD-10-CM | POA: Diagnosis not present

## 2023-12-05 DIAGNOSIS — D649 Anemia, unspecified: Secondary | ICD-10-CM | POA: Diagnosis not present

## 2023-12-05 DIAGNOSIS — Z853 Personal history of malignant neoplasm of breast: Secondary | ICD-10-CM

## 2023-12-05 DIAGNOSIS — I129 Hypertensive chronic kidney disease with stage 1 through stage 4 chronic kidney disease, or unspecified chronic kidney disease: Secondary | ICD-10-CM | POA: Diagnosis present

## 2023-12-05 DIAGNOSIS — E785 Hyperlipidemia, unspecified: Secondary | ICD-10-CM | POA: Diagnosis not present

## 2023-12-05 DIAGNOSIS — E782 Mixed hyperlipidemia: Secondary | ICD-10-CM | POA: Diagnosis not present

## 2023-12-05 DIAGNOSIS — I4891 Unspecified atrial fibrillation: Secondary | ICD-10-CM | POA: Diagnosis not present

## 2023-12-05 DIAGNOSIS — L89322 Pressure ulcer of left buttock, stage 2: Secondary | ICD-10-CM | POA: Diagnosis not present

## 2023-12-05 DIAGNOSIS — Z86000 Personal history of in-situ neoplasm of breast: Secondary | ICD-10-CM

## 2023-12-05 DIAGNOSIS — I2489 Other forms of acute ischemic heart disease: Secondary | ICD-10-CM | POA: Diagnosis present

## 2023-12-05 DIAGNOSIS — Z87442 Personal history of urinary calculi: Secondary | ICD-10-CM

## 2023-12-05 DIAGNOSIS — R002 Palpitations: Secondary | ICD-10-CM | POA: Diagnosis present

## 2023-12-05 DIAGNOSIS — I10 Essential (primary) hypertension: Secondary | ICD-10-CM | POA: Diagnosis present

## 2023-12-05 DIAGNOSIS — C50412 Malignant neoplasm of upper-outer quadrant of left female breast: Secondary | ICD-10-CM | POA: Diagnosis not present

## 2023-12-05 DIAGNOSIS — E872 Acidosis, unspecified: Secondary | ICD-10-CM | POA: Diagnosis not present

## 2023-12-05 DIAGNOSIS — Z90710 Acquired absence of both cervix and uterus: Secondary | ICD-10-CM

## 2023-12-05 DIAGNOSIS — R1313 Dysphagia, pharyngeal phase: Secondary | ICD-10-CM | POA: Diagnosis not present

## 2023-12-05 DIAGNOSIS — K219 Gastro-esophageal reflux disease without esophagitis: Secondary | ICD-10-CM | POA: Diagnosis not present

## 2023-12-05 DIAGNOSIS — R0602 Shortness of breath: Secondary | ICD-10-CM | POA: Diagnosis not present

## 2023-12-05 DIAGNOSIS — Z90722 Acquired absence of ovaries, bilateral: Secondary | ICD-10-CM

## 2023-12-05 DIAGNOSIS — R079 Chest pain, unspecified: Secondary | ICD-10-CM | POA: Diagnosis not present

## 2023-12-05 DIAGNOSIS — I2510 Atherosclerotic heart disease of native coronary artery without angina pectoris: Secondary | ICD-10-CM | POA: Diagnosis present

## 2023-12-05 DIAGNOSIS — R1310 Dysphagia, unspecified: Secondary | ICD-10-CM | POA: Diagnosis present

## 2023-12-05 DIAGNOSIS — R627 Adult failure to thrive: Secondary | ICD-10-CM | POA: Diagnosis present

## 2023-12-05 DIAGNOSIS — C7951 Secondary malignant neoplasm of bone: Secondary | ICD-10-CM | POA: Diagnosis present

## 2023-12-05 DIAGNOSIS — J90 Pleural effusion, not elsewhere classified: Secondary | ICD-10-CM | POA: Diagnosis present

## 2023-12-05 DIAGNOSIS — N1832 Chronic kidney disease, stage 3b: Secondary | ICD-10-CM | POA: Diagnosis present

## 2023-12-05 DIAGNOSIS — I422 Other hypertrophic cardiomyopathy: Secondary | ICD-10-CM | POA: Diagnosis not present

## 2023-12-05 DIAGNOSIS — R531 Weakness: Secondary | ICD-10-CM | POA: Diagnosis not present

## 2023-12-05 DIAGNOSIS — R2689 Other abnormalities of gait and mobility: Secondary | ICD-10-CM | POA: Diagnosis not present

## 2023-12-05 DIAGNOSIS — D61810 Antineoplastic chemotherapy induced pancytopenia: Secondary | ICD-10-CM | POA: Diagnosis not present

## 2023-12-05 DIAGNOSIS — Z66 Do not resuscitate: Secondary | ICD-10-CM | POA: Diagnosis not present

## 2023-12-05 DIAGNOSIS — Z681 Body mass index (BMI) 19 or less, adult: Secondary | ICD-10-CM

## 2023-12-05 DIAGNOSIS — D473 Essential (hemorrhagic) thrombocythemia: Secondary | ICD-10-CM | POA: Diagnosis not present

## 2023-12-05 DIAGNOSIS — Z1732 Human epidermal growth factor receptor 2 negative status: Secondary | ICD-10-CM

## 2023-12-05 DIAGNOSIS — Z803 Family history of malignant neoplasm of breast: Secondary | ICD-10-CM

## 2023-12-05 DIAGNOSIS — D696 Thrombocytopenia, unspecified: Secondary | ICD-10-CM | POA: Diagnosis not present

## 2023-12-05 DIAGNOSIS — L89320 Pressure ulcer of left buttock, unstageable: Secondary | ICD-10-CM | POA: Diagnosis not present

## 2023-12-05 DIAGNOSIS — R2681 Unsteadiness on feet: Secondary | ICD-10-CM | POA: Diagnosis not present

## 2023-12-05 DIAGNOSIS — R918 Other nonspecific abnormal finding of lung field: Secondary | ICD-10-CM | POA: Diagnosis not present

## 2023-12-05 DIAGNOSIS — R000 Tachycardia, unspecified: Secondary | ICD-10-CM | POA: Diagnosis not present

## 2023-12-05 DIAGNOSIS — R0689 Other abnormalities of breathing: Secondary | ICD-10-CM | POA: Diagnosis not present

## 2023-12-05 DIAGNOSIS — D61818 Other pancytopenia: Secondary | ICD-10-CM | POA: Diagnosis present

## 2023-12-05 DIAGNOSIS — I499 Cardiac arrhythmia, unspecified: Secondary | ICD-10-CM | POA: Diagnosis not present

## 2023-12-05 DIAGNOSIS — G893 Neoplasm related pain (acute) (chronic): Secondary | ICD-10-CM | POA: Diagnosis not present

## 2023-12-05 DIAGNOSIS — Z923 Personal history of irradiation: Secondary | ICD-10-CM

## 2023-12-05 DIAGNOSIS — Z170 Estrogen receptor positive status [ER+]: Secondary | ICD-10-CM | POA: Diagnosis not present

## 2023-12-05 DIAGNOSIS — L89101 Pressure ulcer of unspecified part of back, stage 1: Secondary | ICD-10-CM | POA: Diagnosis present

## 2023-12-05 DIAGNOSIS — C787 Secondary malignant neoplasm of liver and intrahepatic bile duct: Secondary | ICD-10-CM | POA: Diagnosis present

## 2023-12-05 DIAGNOSIS — D631 Anemia in chronic kidney disease: Secondary | ICD-10-CM | POA: Diagnosis not present

## 2023-12-05 DIAGNOSIS — Z7982 Long term (current) use of aspirin: Secondary | ICD-10-CM

## 2023-12-05 DIAGNOSIS — Z9049 Acquired absence of other specified parts of digestive tract: Secondary | ICD-10-CM

## 2023-12-05 DIAGNOSIS — Z83438 Family history of other disorder of lipoprotein metabolism and other lipidemia: Secondary | ICD-10-CM

## 2023-12-05 DIAGNOSIS — C50919 Malignant neoplasm of unspecified site of unspecified female breast: Secondary | ICD-10-CM | POA: Diagnosis present

## 2023-12-05 DIAGNOSIS — Z1731 Human epidermal growth factor receptor 2 positive status: Secondary | ICD-10-CM

## 2023-12-05 DIAGNOSIS — M6281 Muscle weakness (generalized): Secondary | ICD-10-CM | POA: Diagnosis not present

## 2023-12-05 DIAGNOSIS — Z8619 Personal history of other infectious and parasitic diseases: Secondary | ICD-10-CM

## 2023-12-05 DIAGNOSIS — I480 Paroxysmal atrial fibrillation: Principal | ICD-10-CM | POA: Diagnosis present

## 2023-12-05 DIAGNOSIS — R0989 Other specified symptoms and signs involving the circulatory and respiratory systems: Secondary | ICD-10-CM | POA: Diagnosis not present

## 2023-12-05 DIAGNOSIS — Z8543 Personal history of malignant neoplasm of ovary: Secondary | ICD-10-CM | POA: Diagnosis not present

## 2023-12-05 DIAGNOSIS — Z8249 Family history of ischemic heart disease and other diseases of the circulatory system: Secondary | ICD-10-CM

## 2023-12-05 DIAGNOSIS — M4854XA Collapsed vertebra, not elsewhere classified, thoracic region, initial encounter for fracture: Secondary | ICD-10-CM | POA: Diagnosis present

## 2023-12-05 DIAGNOSIS — D7589 Other specified diseases of blood and blood-forming organs: Secondary | ICD-10-CM | POA: Diagnosis present

## 2023-12-05 DIAGNOSIS — Z888 Allergy status to other drugs, medicaments and biological substances status: Secondary | ICD-10-CM

## 2023-12-05 DIAGNOSIS — N179 Acute kidney failure, unspecified: Secondary | ICD-10-CM | POA: Diagnosis present

## 2023-12-05 DIAGNOSIS — Z79899 Other long term (current) drug therapy: Secondary | ICD-10-CM

## 2023-12-05 DIAGNOSIS — Z85828 Personal history of other malignant neoplasm of skin: Secondary | ICD-10-CM

## 2023-12-05 DIAGNOSIS — Z743 Need for continuous supervision: Secondary | ICD-10-CM | POA: Diagnosis not present

## 2023-12-05 LAB — CBC WITH DIFFERENTIAL/PLATELET
Abs Immature Granulocytes: 0.27 10*3/uL — ABNORMAL HIGH (ref 0.00–0.07)
Basophils Absolute: 0 10*3/uL (ref 0.0–0.1)
Basophils Relative: 1 %
Eosinophils Absolute: 0 10*3/uL (ref 0.0–0.5)
Eosinophils Relative: 0 %
HCT: 24.8 % — ABNORMAL LOW (ref 36.0–46.0)
Hemoglobin: 8.3 g/dL — ABNORMAL LOW (ref 12.0–15.0)
Immature Granulocytes: 6 %
Lymphocytes Relative: 4 %
Lymphs Abs: 0.2 10*3/uL — ABNORMAL LOW (ref 0.7–4.0)
MCH: 33.7 pg (ref 26.0–34.0)
MCHC: 33.5 g/dL (ref 30.0–36.0)
MCV: 100.8 fL — ABNORMAL HIGH (ref 80.0–100.0)
Monocytes Absolute: 0.1 10*3/uL (ref 0.1–1.0)
Monocytes Relative: 2 %
Neutro Abs: 3.9 10*3/uL (ref 1.7–7.7)
Neutrophils Relative %: 87 %
Platelets: 107 10*3/uL — ABNORMAL LOW (ref 150–400)
RBC: 2.46 MIL/uL — ABNORMAL LOW (ref 3.87–5.11)
RDW: 17.2 % — ABNORMAL HIGH (ref 11.5–15.5)
Smear Review: DECREASED
WBC: 4.4 10*3/uL (ref 4.0–10.5)
nRBC: 0 % (ref 0.0–0.2)

## 2023-12-05 LAB — TROPONIN I (HIGH SENSITIVITY)
Troponin I (High Sensitivity): 30 ng/L — ABNORMAL HIGH (ref <18)
Troponin I (High Sensitivity): 35 ng/L — ABNORMAL HIGH (ref <18)

## 2023-12-05 LAB — BASIC METABOLIC PANEL
Anion gap: 8 (ref 5–15)
BUN: 38 mg/dL — ABNORMAL HIGH (ref 8–23)
CO2: 16 mmol/L — ABNORMAL LOW (ref 22–32)
Calcium: 7.9 mg/dL — ABNORMAL LOW (ref 8.9–10.3)
Chloride: 111 mmol/L (ref 98–111)
Creatinine, Ser: 1.44 mg/dL — ABNORMAL HIGH (ref 0.44–1.00)
GFR, Estimated: 35 mL/min — ABNORMAL LOW (ref >60)
Glucose, Bld: 166 mg/dL — ABNORMAL HIGH (ref 70–99)
Potassium: 4.1 mmol/L (ref 3.5–5.1)
Sodium: 135 mmol/L (ref 135–145)

## 2023-12-05 LAB — CBG MONITORING, ED: Glucose-Capillary: 152 mg/dL — ABNORMAL HIGH (ref 70–99)

## 2023-12-05 LAB — BRAIN NATRIURETIC PEPTIDE: B Natriuretic Peptide: 998.6 pg/mL — ABNORMAL HIGH (ref 0.0–100.0)

## 2023-12-05 MED ORDER — ROSUVASTATIN CALCIUM 5 MG PO TABS
10.0000 mg | ORAL_TABLET | ORAL | Status: DC
  Administered 2023-12-06 – 2023-12-08 (×2): 10 mg via ORAL
  Filled 2023-12-05 (×3): qty 2

## 2023-12-05 MED ORDER — ONDANSETRON HCL 4 MG/2ML IJ SOLN: 4.0000 mg | Freq: Four times a day (QID) | INTRAMUSCULAR | Status: DC | PRN

## 2023-12-05 MED ORDER — MELATONIN 3 MG PO TABS
6.0000 mg | ORAL_TABLET | Freq: Every evening | ORAL | Status: DC | PRN
  Administered 2023-12-05 – 2023-12-07 (×3): 6 mg via ORAL
  Filled 2023-12-05 (×3): qty 2

## 2023-12-05 MED ORDER — POLYETHYLENE GLYCOL 3350 17 G PO PACK: 17.0000 g | PACK | Freq: Every day | ORAL | Status: DC | PRN

## 2023-12-05 MED ORDER — ONDANSETRON HCL 4 MG PO TABS: 4.0000 mg | ORAL_TABLET | Freq: Four times a day (QID) | ORAL | Status: DC | PRN

## 2023-12-05 MED ORDER — OXYCODONE HCL 5 MG PO TABS: 5.0000 mg | ORAL_TABLET | ORAL | Status: DC | PRN

## 2023-12-05 MED ORDER — SUCRALFATE 1 GM/10ML PO SUSP
1.0000 g | Freq: Three times a day (TID) | ORAL | Status: DC
  Administered 2023-12-06 – 2023-12-09 (×13): 1 g via ORAL
  Filled 2023-12-05 (×14): qty 10

## 2023-12-05 MED ORDER — DILTIAZEM LOAD VIA INFUSION
20.0000 mg | Freq: Once | INTRAVENOUS | Status: AC
  Administered 2023-12-05: 20 mg via INTRAVENOUS
  Filled 2023-12-05: qty 20

## 2023-12-05 MED ORDER — ACETAMINOPHEN 325 MG PO TABS: 650.0000 mg | ORAL_TABLET | Freq: Four times a day (QID) | ORAL | Status: DC | PRN

## 2023-12-05 MED ORDER — ACETAMINOPHEN 650 MG RE SUPP: 650.0000 mg | Freq: Four times a day (QID) | RECTAL | Status: DC | PRN

## 2023-12-05 MED ORDER — DILTIAZEM HCL-DEXTROSE 125-5 MG/125ML-% IV SOLN (PREMIX)
5.0000 mg/h | INTRAVENOUS | Status: DC
  Administered 2023-12-05: 5 mg/h via INTRAVENOUS
  Filled 2023-12-05: qty 125

## 2023-12-05 MED ORDER — VITAMIN B-12 1000 MCG PO TABS
1000.0000 ug | ORAL_TABLET | Freq: Every day | ORAL | Status: DC
  Administered 2023-12-06 – 2023-12-09 (×4): 1000 ug via ORAL
  Filled 2023-12-05 (×4): qty 1

## 2023-12-05 MED ORDER — PANTOPRAZOLE SODIUM 20 MG PO TBEC
20.0000 mg | DELAYED_RELEASE_TABLET | Freq: Every day | ORAL | Status: DC
  Administered 2023-12-06 – 2023-12-09 (×4): 20 mg via ORAL
  Filled 2023-12-05 (×4): qty 1

## 2023-12-05 MED ORDER — ASPIRIN 81 MG PO TBEC
81.0000 mg | DELAYED_RELEASE_TABLET | Freq: Every day | ORAL | Status: DC
  Administered 2023-12-06 – 2023-12-09 (×4): 81 mg via ORAL
  Filled 2023-12-05 (×4): qty 1

## 2023-12-05 MED ORDER — SODIUM CHLORIDE 0.9 % IV BOLUS
500.0000 mL | Freq: Once | INTRAVENOUS | Status: AC
  Administered 2023-12-05: 500 mL via INTRAVENOUS

## 2023-12-05 NOTE — ED Triage Notes (Signed)
 Pt BIB [facility] from [facility] for increases shortness of breath and chest pain that started around 1300 today. Per [facility] patient had received 2 nitroglycerin at the facility with no relief. Patient was recently diagnosed with afib.

## 2023-12-05 NOTE — H&P (Signed)
 History and Physical  Patient: [name]:[id number] DOB: Aug 01, 1934 DOA: 12/05/2023 DOS: the patient was seen and examined on 12/05/2023 Patient coming from: SNF, [facility]  Chief Complaint:  Chief Complaint  Patient presents with   Shortness of Breath   HPI: [name] is a 89 y.o. female with PMH significant of coronary artery disease, breast cancer, left, ER positive, atrial fibrillation, hypertrophic cardiomyopathy, dysphagia who was just discharged yesterday 12/04/2023 from [facility] (admitted 2/14 -12/04/2023 with acute kidney injury, diarrhea, found to have new onset atrial fibrillation with RVR).  Patient reported that she was in her normal baseline health until 2 PM today when she started having sudden palpitations, chest tightness, radiating to the left arm, shortness of breath while she was eating her soup.  Denied any dizziness, lightheadedness or syncopal episode.  EMS was called as patient was having progressively worsening shortness of breath and chest pain, received 2 sublingual nitroglycerin at the facility with no relief.  Patient was brought to the ER and was found to be in rapid atrial fibrillation with RVR with heart rate in 150s.  She was placed on IV Cardizem drip. At the time of my encounter, heart rate is now in 70s to 80s on Cardizem drip, patient feels better now with the heart rate controlled, chest pain and shortness of breath improved.  ED course:  Temp 97.7 F, heart rate initially in 150s, BP 116/97, O2 sats 100% on room air Labs showed sodium 135, CO2 16, BUN 38, creatinine 1.44 (creatinine was 1.2 on discharge yesterday) Troponin 30, BNP 998.6 WBCs 4.4, hemoglobin 8.3, MCV 100.8, platelets 107, platelets were 83K on 12/04/2023.  Review of Systems: As mentioned in the history of present illness. All other systems reviewed and are negative. Past Medical History:  Diagnosis Date   Arthritis    HANDS AND BACK   Atypical ductal  hyperplasia of breast 02/2002   Breast cancer (HCC) 2010   RIGHT   CAD (coronary artery disease)    Dysuria    Family history of breast cancer    GERD (gastroesophageal reflux disease)    HX OF   Heart murmur    History of blood transfusion    History of breast cancer ONCOLOGIST-- DR Truett Perna   DX 2010--  RIGHT BREAST DCIS  HIGH GRADE S/P LUMPECTOMY W/ SLN BX--  NO RECURRENCE   History of kidney stones    History of ovarian cancer    2009--  S/P COLON RESECTION FOR PAPILLARY SEROUS CARCINOMA, BORDERLINE OVARIAN CANCER VERSUS PRIMARY PERITONEAL CARCINOMA WITH PSAMMOMA BODIES--  NO RECURRENCE   History of skin cancer    excision basal cell   Hyperlipidemia    Hypertension    Kidney stones 7/14   Nocturia    Ovarian carcinoma (HCC)    papillary serous-low grade, recurrence found in fat necrosis during ileostomy   Renal stones 2014   Shingles 30 YRS AGO   Ureteral calculi    bilateral   Past Surgical History:  Procedure Laterality Date   APPENDECTOMY     BREAST LUMPECTOMY Right 04-13-2009   W/ SLN BX   BREAST LUMPECTOMY WITH RADIOACTIVE SEED LOCALIZATION Left 06/11/2019   Procedure: LEFT BREAST LUMPECTOMY WITH RADIOACTIVE SEED LOCALIZATION;  Surgeon: Griselda Miner, MD;  Location: [facility] OR;  Service: General;  Laterality: Left;   CARDIAC CATHETERIZATION  11-11-2005 DR Verdis Prime   MODERATE LAD DISEASE/ NORMAL LVF/ EF 65-75%   CATARACT EXTRACTION W/ INTRAOCULAR LENS IMPLANT Bilateral  04/26/2013   CHOLECYSTECTOMY  1990   OPEN   COLONOSCOPY     CYSTOSCOPY W/ URETERAL STENT PLACEMENT Bilateral 05/07/2013   Procedure: CYSTOSCOPY WITH STENT REPLACEMENTS;  Surgeon: Sebastian Ache, MD;  Location: [facility];  Service: Urology;  Laterality: Bilateral;   CYSTOSCOPY WITH BIOPSY N/A 04/07/2013   Procedure: CYSTOSCOPY WITH BIOPSY and fulgeration;  Surgeon: Sebastian Ache, MD;  Location: [facility];  Service: Urology;  Laterality: N/A;   CYSTOSCOPY WITH RETROGRADE PYELOGRAM,  URETEROSCOPY AND STENT PLACEMENT Bilateral 05/07/2013   Procedure: CYSTOSCOPY WITH RETROGRADE PYELOGRAM, URETEROSCOPY ;  Surgeon: Sebastian Ache, MD;  Location: [facility];  Service: Urology;  Laterality: Bilateral;   CYSTOSCOPY WITH RETROGRADE PYELOGRAM, URETEROSCOPY AND STENT PLACEMENT Bilateral 01/06/2019   Procedure: CYSTOSCOPY WITH RETROGRADE PYELOGRAM, URETEROSCOPY AND STENT PLACEMENT, FIRST STAGE;  Surgeon: Sebastian Ache, MD;  Location: [facility];  Service: Urology;  Laterality: Bilateral;   CYSTOSCOPY WITH RETROGRADE PYELOGRAM, URETEROSCOPY AND STENT PLACEMENT Bilateral 01/27/2019   Procedure: CYSTOSCOPY WITH RETROGRADE PYELOGRAM, URETEROSCOPY AND STENT PLACEMENT;  Surgeon: Sebastian Ache, MD;  Location: [facility];  Service: Urology;  Laterality: Bilateral;  75 MINS   CYSTOSCOPY WITH STENT PLACEMENT Bilateral 04/07/2013   Procedure: CYSTOSCOPY WITH STENT PLACEMENT;  Surgeon: Sebastian Ache, MD;  Location: [facility];  Service: Urology;  Laterality: Bilateral;   DX LAPAROSCOPY W/ PERITONEAL AND OMENTAL BX'S AND WASHINGS  02-16-2008   EXCISION RIGHT BREAST MASS  02-10-2002   EXP. LAP. EXTENSIVE ADHESIOLYSIS/ RESECTION TERMINAL ILEUM , ASCENDING AND DESCENDING COLON WITH CREATION ILEOSTOMY AND MUCOUS FISTULA  02-19-2008   PERFERATION AND ABD. CANCER--  TAKEDOWN ILEOSTOMY 08-23-2008   HOLMIUM LASER APPLICATION Bilateral 01/06/2019   Procedure: HOLMIUM LASER APPLICATION;  Surgeon: Sebastian Ache, MD;  Location: [facility];  Service: Urology;  Laterality: Bilateral;   HOLMIUM LASER APPLICATION Bilateral 01/27/2019   Procedure: HOLMIUM LASER APPLICATION;  Surgeon: Sebastian Ache, MD;  Location: [facility];  Service: Urology;  Laterality: Bilateral;   I & D EXTREMITY Right 02/02/2022   Procedure: IRRIGATION AND DEBRIDEMENT HAND;  Surgeon: Bradly Bienenstock, MD;  Location[facility];  Service: Orthopedics;  Laterality: Right;   ILEOSTOMY CLOSURE  08/2008   TOTAL ABDOMINAL HYSTERECTOMY W/ BILATERAL  SALPINGOOPHORECTOMY  1976   TOTAL ABDOMINAL HYSTERECTOMY W/ BILATERAL SALPINGOOPHORECTOMY  1976   VULVAR LESION REMOVAL N/A 08/22/2014   Procedure: EXCISION OF VULVAR CYST  ;  Surgeon: Annamaria Boots, MD;  Location: [facility];  Service: Gynecology;  Laterality: N/A;   WOUND DEBRIDEMENT  05/28/2012   Procedure: DEBRIDEMENT ABDOMINAL WOUND;  Surgeon: Currie Paris, MD;  Location: [facility];  Service: General;  Laterality: N/A;  excision chronic wound abdominal wall   Social History:  reports that she has never smoked. She has never used smokeless tobacco. She reports that she does not drink alcohol and does not use drugs. Allergies  Allergen Reactions   Ramipril Itching   Family History  Problem Relation Age of Onset   Heart disease Mother    Hyperlipidemia Mother    Breast cancer Sister    Cancer Sister        breast   Prior to Admission medications   Medication Sig Start Date End Date Taking? Authorizing Provider  abemaciclib (VERZENIO) 100 MG tablet Take 1 tablet (100 mg total) by mouth 2 (two) times daily. 11/07/23   Ladene Artist, MD  aspirin EC 81 MG tablet Take 81 mg by mouth daily.    [provider]  Marcellus Scott  10 MG TABS Take 10 mg by mouth at bedtime. 01/20/19   [provider]  Calcium Carbonate-Vitamin D (CALTRATE 600+D PO) Take 1 tablet by mouth 2 (two) times daily.    [provider]  carvedilol (COREG) 25 MG tablet Take 1 tablet (25 mg total) by mouth 2 (two) times daily with a meal. 02/27/22   Lyn Records, MD  Cholecalciferol (VITAMIN D3) 50 MCG (2000 UT) TABS Take 2,000 Units by mouth in the morning and at bedtime.    [provider]  cyanocobalamin (VITAMIN B12) 1000 MCG tablet Take 1,000 mcg by mouth daily.    [provider]  dexamethasone (DECADRON) 4 MG tablet Take 1 tab (4 mg) twice a day for 3 days then take 1 tab daily Patient taking differently: Take 4 mg by mouth daily with breakfast. 10/21/23    Rana Snare, NP  diltiazem (CARDIZEM SR) 90 MG 12 hr capsule Take 1 capsule (90 mg total) by mouth every 12 (twelve) hours. 12/04/23   Briant Cedar, MD  feeding supplement (ENSURE ENLIVE / ENSURE PLUS) LIQD Take 237 mLs by mouth 3 (three) times daily between meals. 12/04/23   Briant Cedar, MD  hydroxychloroquine (PLAQUENIL) 200 MG tablet Take 200 mg by mouth daily. 11/08/21   [provider]  hydroxyurea (HYDREA) 500 MG capsule TAKE 1 CAPSULE BY MOUTH EVERY MONDAY, WEDNESDAY, AND FRIDAY. MAY TAKE WITH FOOD TO MINIMIZE GI SIDE EFFECTS 10/23/23   Ladene Artist, MD  isosorbide mononitrate (IMDUR) 60 MG 24 hr tablet Take 1 tablet (60 mg total) by mouth daily. 05/16/23   Jake Bathe, MD  liver oil-zinc oxide (DESITIN) 40 % ointment Apply topically 4 (four) times daily as needed for irritation. 12/04/23   Briant Cedar, MD  loperamide (IMODIUM) 2 MG capsule Take 1 capsule (2 mg total) by mouth as needed for diarrhea or loose stools. 12/04/23   Briant Cedar, MD  Multiple Vitamins-Minerals (MULTIVITAMIN WOMEN 50+) TABS Take 1 tablet by mouth daily with breakfast.    [provider]  oxyCODONE (OXY IR/ROXICODONE) 5 MG immediate release tablet Take 1 tablet (5 mg total) by mouth every 4 (four) hours as needed for up to 5 days for severe pain (pain score 7-10) or moderate pain (pain score 4-6). May take 2 tablets if pain is severe.  Do not drive while taking pain medication. 12/04/23 12/09/23  Briant Cedar, MD  pantoprazole (PROTONIX) 20 MG tablet Take 1 tablet (20 mg total) by mouth daily. 12/05/23   Briant Cedar, MD  potassium chloride (KLOR-CON M) 10 MEQ tablet Take 1 tablet (10 meq) twice daily for 3 days, then 1 tablet daily. Patient taking differently: Take 10 mEq by mouth daily. 11/07/23   Rana Snare, NP  PROCTOZONE-HC 2.5 % rectal cream PLACE 1 APPLICATION RECTALLY 2 TIMES A DAY AS NEEDED FOR HEMORRHOIDS. Patient taking differently: Place 1  application  rectally 2 (two) times daily as needed for hemorrhoids. 01/09/15   Cross, Efraim Kaufmann D, NP  rosuvastatin (CRESTOR) 10 MG tablet Take 10 mg by mouth every other day.    [provider]  sucralfate (CARAFATE) 1 GM/10ML suspension Take 10 mLs (1 g total) by mouth 4 (four) times daily -  with meals and at bedtime. 12/04/23   Briant Cedar, MD  tiZANidine (ZANAFLEX) 4 MG tablet Take 4 mg by mouth 2 (two) times daily as needed for muscle spasms. 10/10/23   [provider]  triamcinolone (KENALOG) 0.1 % Apply 1 application  topically as needed (for rashes- affected areas). 04/04/20   [provider]  Wound Dressings (SONAFINE) Apply 1 Application topically 2 (two) times daily as needed (Apply to areas of redness from radiation as needed. Call Dr. Colletta Maryland nurse at [phone] if you have any questions). 12/04/23   Briant Cedar, MD   Physical Exam: Vitals:   12/05/23 1630 12/05/23 1645 12/05/23 1700 12/05/23 1730  BP: (!) 115/59 (!) 119/58 (!) 114/55 (!) 115/56  Pulse: 82 79 76 67  Resp: 16 16 15 12   Temp:      TempSrc:      SpO2: 100% 100% 100% 99%  Weight:      Height:         General: Alert, awake, oriented x3, NAD, frail Eyes: pink conjunctiva, anicteric sclera, PERLA HEENT: normocephalic, atraumatic, oropharynx clear Neck: supple, no masses or lymphadenopathy, no JVD CVS: irregularly irregular Resp : Clear to auscultation bilaterally, no wheezing, rales or rhonchi. GI : Soft, nontender, nondistended, positive bowel sounds. No hepatomegaly.  Ext: No lower extremity edema  Musculoskeletal: No clubbing or cyanosis, positive pedal pulses. No contracture. ROM intact  Neuro: Grossly intact, no focal neurological deficits, strength 5/5 upper and lower extremities bilaterally Psych: alert and oriented x 3, normal mood and affect Skin: no rashes or lesions, warm and dry   Data Reviewed: I have reviewed ED notes, Vitals, Lab results and outpatient  records.   Recent Labs  Lab 12/04/23 0604 12/05/23 1536  NA 135 135  K 3.9 4.1  CL 111 111  CO2 18* 16*  GLUCOSE 128* 166*  BUN 33* 38*  CREATININE 1.21* 1.44*  CALCIUM 7.6* 7.9*  MG 2.3  --    Recent Labs  Lab 12/04/23 0604 12/05/23 1536  WBC 3.2* 4.4  NEUTROABS 2.6 3.9  HGB 8.1* 8.3*  HCT 24.6* 24.8*  MCV 102.1* 100.8*  PLT 83* 107*    Assessment and Plan Principal Problem: Paroxysmal atrial fibrillation with RVR (HCC) -Patient was placed on IV Cardizem drip, heart now controlled -Cardiology was consulted in the previous admission and had recommended avoiding ablation, cardioversion and had recommended rate control strategy.  Per discharge summary, anticoagulation was discussed with the family and was declined.  Patient was discharged on Coreg and diltiazem -For now continue IV Cardizem drip, cardiology was consulted in ED, will await recommendations -2D echo on 2/18 had shown EF of 65 to 70%, no regional WMA  Active Problems:  Acute kidney injury -Creatinine 1.4 on admission -Creatinine was 1.2 yesterday on discharge -Avoid hypotension, nephrotoxic medications, and the previous admission had discontinued chlorthalidone    Hypertension -Currently on IV Cardizem drip, BP stable  Coronary artery disease, hyperlipidemia History of hypertrophic cardiomyopathy -Has history of chronic stable angina, chest tightness today with palpitations and shortness of breath due to #1 -Troponin 30, obtain serial troponin, recent echo 2/18 had shown EF of 65 to 70%, no regional WMA -During previous admission, cardiology had recommended against cardiac MRI or invasive testing    Carcinoma of upper-outer quadrant of left breast in female, estrogen receptor positive (HCC), 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 -Verzenio currently on hold -Outpatient follow-up with oncology, radiation oncology, palliative medicine -Continue pain control  Pancytopenia with anemia, thrombocytopenia  macrocytosis -Platelets improving, was 83K on 2/27, improved to 107 today -Improving, during previous admission, this was discussed with hematology and was thought likely due to [facility] and probably bone marrow infiltration, recommended supportive care  Advance Care Planning:   Code Status: Limited: Do not attempt resuscitation (DNR) -DNR-LIMITED -Do Not Intubate/DNI   Consults: Cardiology Family Communication: No family member at the bedside Severity of Illness:      The appropriate patient status for this patient is INPATIENT. Inpatient status is judged to be reasonable and necessary in order to provide the required intensity of service to ensure the patient's safety. The patient's presenting symptoms, physical exam findings, and initial radiographic and laboratory data in the context of their chronic comorbidities is felt to place them at high risk for further clinical deterioration. Furthermore, it is not anticipated that the patient will be medically stable for discharge from the hospital within 2 midnights of admission.   * I certify that at the point of admission it is my clinical judgment that the patient will require inpatient hospital care spanning beyond 2 midnights from the point of admission due to high intensity of service, high risk for further deterioration and high frequency of surveillance required.*    Author: Thad Ranger, MD 12/05/2023 5:55 PM For on call review [email or web address].

## 2023-12-05 NOTE — ED Provider Notes (Signed)
  EMERGENCY DEPARTMENT AT [facility] Provider Note   CSN: [id number] Arrival date & time: 12/05/23  1451     History  Chief Complaint  Patient presents with   Shortness of Breath    [name] is a 89 y.o. female.  Pt is a 89 yo female with pmhx significant for breast cancer metastatic to bone, liver, and peritoneum, htn, CAD, FTT, and recent dx afib.  Pt was admitted to [facility] on 2/14 and was d/c yesterday.  While there cards was consulted.  Rate control was recommended.  Family declined anticoagulation.  Pt d/c with diltiazem and coreg.  Today, pt developed sob and cp.  She was given 2 nitroglycerin which did not help, so EMS was called.  EMS reports HR in the 150s.  Pt's HR is down to the 120s-130s now and she's feeling a little better.  No fevers.        Home Medications Prior to Admission medications   Medication Sig Start Date End Date Taking? Authorizing Provider  abemaciclib (VERZENIO) 100 MG tablet Take 1 tablet (100 mg total) by mouth 2 (two) times daily. 11/07/23   Ladene Artist, MD  aspirin EC 81 MG tablet Take 81 mg by mouth daily.    [provider]  BELSOMRA 10 MG TABS Take 10 mg by mouth at bedtime. 01/20/19   [provider]  Calcium Carbonate-Vitamin D (CALTRATE 600+D PO) Take 1 tablet by mouth 2 (two) times daily.    [provider]  carvedilol (COREG) 25 MG tablet Take 1 tablet (25 mg total) by mouth 2 (two) times daily with a meal. 02/27/22   Lyn Records, MD  Cholecalciferol (VITAMIN D3) 50 MCG (2000 UT) TABS Take 2,000 Units by mouth in the morning and at bedtime.    [provider]  cyanocobalamin (VITAMIN B12) 1000 MCG tablet Take 1,000 mcg by mouth daily.    [provider]  dexamethasone (DECADRON) 4 MG tablet Take 1 tab (4 mg) twice a day for 3 days then take 1 tab daily Patient taking differently: Take 4 mg by mouth daily with breakfast. 10/21/23   Rana Snare, NP  diltiazem  (CARDIZEM SR) 90 MG 12 hr capsule Take 1 capsule (90 mg total) by mouth every 12 (twelve) hours. 12/04/23   Briant Cedar, MD  feeding supplement (ENSURE ENLIVE / ENSURE PLUS) LIQD Take 237 mLs by mouth 3 (three) times daily between meals. 12/04/23   Briant Cedar, MD  hydroxychloroquine (PLAQUENIL) 200 MG tablet Take 200 mg by mouth daily. 11/08/21   [provider]  hydroxyurea (HYDREA) 500 MG capsule TAKE 1 CAPSULE BY MOUTH EVERY MONDAY, WEDNESDAY, AND FRIDAY. MAY TAKE WITH FOOD TO MINIMIZE GI SIDE EFFECTS 10/23/23   Ladene Artist, MD  isosorbide mononitrate (IMDUR) 60 MG 24 hr tablet Take 1 tablet (60 mg total) by mouth daily. 05/16/23   Jake Bathe, MD  liver oil-zinc oxide (DESITIN) 40 % ointment Apply topically 4 (four) times daily as needed for irritation. 12/04/23   Briant Cedar, MD  loperamide (IMODIUM) 2 MG capsule Take 1 capsule (2 mg total) by mouth as needed for diarrhea or loose stools. 12/04/23   Briant Cedar, MD  Multiple Vitamins-Minerals (MULTIVITAMIN WOMEN 50+) TABS Take 1 tablet by mouth daily with breakfast.    [provider]  oxyCODONE (OXY IR/ROXICODONE) 5 MG immediate release tablet Take 1 tablet (5 mg total) by mouth every 4 (four)  hours as needed for up to 5 days for severe pain (pain score 7-10) or moderate pain (pain score 4-6). May take 2 tablets if pain is severe.  Do not drive while taking pain medication. 12/04/23 12/09/23  Briant Cedar, MD  pantoprazole (PROTONIX) 20 MG tablet Take 1 tablet (20 mg total) by mouth daily. 12/05/23   Briant Cedar, MD  potassium chloride (KLOR-CON M) 10 MEQ tablet Take 1 tablet (10 meq) twice daily for 3 days, then 1 tablet daily. Patient taking differently: Take 10 mEq by mouth daily. 11/07/23   Rana Snare, NP  PROCTOZONE-HC 2.5 % rectal cream PLACE 1 APPLICATION RECTALLY 2 TIMES A DAY AS NEEDED FOR HEMORRHOIDS. Patient taking differently: Place 1 application  rectally 2 (two)  times daily as needed for hemorrhoids. 01/09/15   Cross, Efraim Kaufmann D, NP  rosuvastatin (CRESTOR) 10 MG tablet Take 10 mg by mouth every other day.    [provider]  sucralfate (CARAFATE) 1 GM/10ML suspension Take 10 mLs (1 g total) by mouth 4 (four) times daily -  with meals and at bedtime. 12/04/23   Briant Cedar, MD  tiZANidine (ZANAFLEX) 4 MG tablet Take 4 mg by mouth 2 (two) times daily as needed for muscle spasms. 10/10/23   [provider]  triamcinolone (KENALOG) 0.1 % Apply 1 application  topically as needed (for rashes- affected areas). 04/04/20   [provider]  Wound Dressings (SONAFINE) Apply 1 Application topically 2 (two) times daily as needed (Apply to areas of redness from radiation as needed. Call Dr. Colletta Maryland nurse at [phone] if you have any questions). 12/04/23   Briant Cedar, MD      Allergies    Ramipril    Review of Systems   Review of Systems  Respiratory:  Positive for shortness of breath.   Cardiovascular:  Positive for chest pain.  All other systems reviewed and are negative.   Physical Exam Updated Vital Signs BP (!) 119/58   Pulse 79   Temp 97.7 F (36.5 C) (Oral)   Resp 16   Ht 5\' 6"  (1.676 m)   Wt 52.2 kg   LMP 10/07/1974   SpO2 100%   BMI 18.56 kg/m  Physical Exam Vitals and nursing note reviewed.  Constitutional:      Appearance: She is cachectic.  HENT:     Head: Normocephalic and atraumatic.     Mouth/Throat:     Mouth: Mucous membranes are dry.     Pharynx: Oropharynx is clear.  Eyes:     Extraocular Movements: Extraocular movements intact.     Pupils: Pupils are equal, round, and reactive to light.  Cardiovascular:     Rate and Rhythm: Tachycardia present. Rhythm irregular.  Pulmonary:     Effort: Pulmonary effort is normal.     Breath sounds: Normal breath sounds.  Abdominal:     General: Bowel sounds are normal.     Palpations: Abdomen is soft.  Musculoskeletal:        General: Normal  range of motion.     Cervical back: Normal range of motion and neck supple.  Skin:    General: Skin is warm.     Capillary Refill: Capillary refill takes less than 2 seconds.  Neurological:     General: No focal deficit present.     Mental Status: She is alert and oriented to person, place, and time.  Psychiatric:        Mood and Affect: Mood normal.  Behavior: Behavior normal.     ED Results / Procedures / Treatments   Labs (all labs ordered are listed, but only abnormal results are displayed) Labs Reviewed  BASIC METABOLIC PANEL - Abnormal; Notable for the following components:      Result Value   CO2 16 (*)    Glucose, Bld 166 (*)    BUN 38 (*)    Creatinine, Ser 1.44 (*)    Calcium 7.9 (*)    GFR, Estimated 35 (*)    All other components within normal limits  CBC WITH DIFFERENTIAL/PLATELET - Abnormal; Notable for the following components:   RBC 2.46 (*)    Hemoglobin 8.3 (*)    HCT 24.8 (*)    MCV 100.8 (*)    RDW 17.2 (*)    Platelets 107 (*)    Lymphs Abs 0.2 (*)    Abs Immature Granulocytes 0.27 (*)    All other components within normal limits  BRAIN NATRIURETIC PEPTIDE - Abnormal; Notable for the following components:   B Natriuretic Peptide 998.6 (*)    All other components within normal limits  CBG MONITORING, ED - Abnormal; Notable for the following components:   Glucose-Capillary 152 (*)    All other components within normal limits  TROPONIN I (HIGH SENSITIVITY) - Abnormal; Notable for the following components:   Troponin I (High Sensitivity) 30 (*)    All other components within normal limits  TROPONIN I (HIGH SENSITIVITY)    EKG EKG Interpretation Date/Time:  Friday December 05 2023 15:11:16 EST Ventricular Rate:  143 PR Interval:    QRS Duration:  86 QT Interval:  309 QTC Calculation: 444 R Axis:   10  Text Interpretation: Atrial fibrillation with rapid V-rate Ventricular premature complex Anteroseptal infarct, old Repolarization  abnormality, prob rate related Since last tracing rate faster Confirmed by Jacalyn Lefevre [phone]) on 12/05/2023 3:45:17 PM  Radiology DG Chest Port 1 View Result Date: 12/05/2023 CLINICAL DATA:  Shortness of breath EXAM: PORTABLE CHEST 1 VIEW COMPARISON:  Chest radiograph dated 04/07/2013 FINDINGS: Low lung volumes with bronchovascular crowding. Bibasilar patchy opacities and dense left retrocardiac opacity. Blunting of the bilateral costophrenic angles. No pneumothorax. The heart size and mediastinal contours are within normal limits. No acute osseous abnormality. IMPRESSION: 1. Low lung volumes with bronchovascular crowding. Bibasilar patchy opacities and dense left retrocardiac opacity, which may represent atelectasis, aspiration, or pneumonia. 2. Blunting of the bilateral costophrenic angles, which may represent small pleural effusions. Electronically Signed   By: Agustin Cree M.D.   On: 12/05/2023 16:33    Procedures Procedures    Medications Ordered in ED Medications  diltiazem (CARDIZEM) 1 mg/mL load via infusion 20 mg (20 mg Intravenous Bolus from Bag 12/05/23 1556)    And  diltiazem (CARDIZEM) 125 mg in dextrose 5% 125 mL (1 mg/mL) infusion (5 mg/hr Intravenous New Bag/Given 12/05/23 1556)  sodium chloride 0.9 % bolus 500 mL (500 mLs Intravenous New Bag/Given 12/05/23 1613)    ED Course/ Medical Decision Making/ A&P                                 Medical Decision Making Amount and/or Complexity of Data Reviewed Labs: ordered. Radiology: ordered.  Risk Prescription drug management. Decision regarding hospitalization.   This patient presents to the ED for concern of sob and cp, this involves an extensive number of treatment options, and is a complaint that carries with it a  high risk of complications and morbidity.  The differential diagnosis includes cardiac, pulm, gi   Co morbidities that complicate the patient evaluation  breast cancer metastatic to bone, liver, and  peritoneum, htn, CAD, FTT, and recent dx afib   Additional history obtained:  Additional history obtained from epic chart review External records from outside source obtained and reviewed including EMS report   Lab Tests:  I Ordered, and personally interpreted labs.  The pertinent results include:  cbc with hgb 8.3 (stable), plt low at 107 (chronic); bmp with CO2 low at 16, bun 38, cr 1.44 (stable), ca low at 7.9 (stable); BNP elevated at 998.6, trop elevated at 30   Imaging Studies ordered:  I ordered imaging studies including cxr  I independently visualized and interpreted imaging which showed   Low lung volumes with bronchovascular crowding. Bibasilar patchy  opacities and dense left retrocardiac opacity, which may represent  atelectasis, aspiration, or pneumonia.  2. Blunting of the bilateral costophrenic angles, which may  represent small pleural effusions.   I agree with the radiologist interpretation   Cardiac Monitoring:  The patient was maintained on a cardiac monitor.  I personally viewed and interpreted the cardiac monitored which showed an underlying rhythm of: afib with rvr   Medicines ordered and prescription drug management:  I ordered medication including cardizem bolus and infusion  for afib  Reevaluation of the patient after these medicines showed that the patient improved I have reviewed the patients home medicines and have made adjustments as needed  Critical Interventions:  cardizem   Problem List / ED Course:  Afib with rvr:  pt's HR up to 150s and is now down to the 80s on a cardizem gtt.  She is feeling better with HR back to normal.  She said she was given all her meds this am, so oral meds are not controlling her HR. Pt opts to not be on thinners. CHA2DS2/VAS Stroke Risk Points  Current as of 15 minutes ago     5 >= 2 Points: High Risk  1 to 1.99 Points: Medium Risk  0 Points: Low Risk    No Change      Details    This score determines  the patient's risk of having a stroke if the  patient has atrial fibrillation.       Points Metrics  0 Has Congestive Heart Failure:  No    Current as of 15 minutes ago  1 Has Vascular Disease:  Yes    Current as of 15 minutes ago  1 Has Hypertension:  Yes    Current as of 15 minutes ago  2 Age:  14    Current as of 15 minutes ago  0 Has Diabetes Excluding Gestational Diabetes:  No    Current as of 15 minutes ago  0 Had Stroke:  No  Had TIA:  No  Had Thromboembolism:  No    Current as of 15 minutes ago  1 Female:  Yes    Current as of 15 minutes ago             Reevaluation:  After the interventions noted above, I reevaluated the patient and found that they have :improved   Social Determinants of Health:  Lives in a facility   Dispostion:  After consideration of the diagnostic results and the patients response to treatment, I feel that the patent would benefit from admission.  CRITICAL CARE Performed by: Jacalyn Lefevre   Total critical care time:  30 minutes  Critical care time was exclusive of separately billable procedures and treating other patients.  Critical care was necessary to treat or prevent imminent or life-threatening deterioration.  Critical care was time spent personally by me on the following activities: development of treatment plan with patient and/or surrogate as well as nursing, discussions with consultants, evaluation of patient's response to treatment, examination of patient, obtaining history from patient or surrogate, ordering and performing treatments and interventions, ordering and review of laboratory studies, ordering and review of radiographic studies, pulse oximetry and re-evaluation of patient's condition.           Final Clinical Impression(s) / ED Diagnoses Final diagnoses:  Atrial fibrillation with RVR (HCC)  Chronic anemia  Thrombocytopenia (HCC)    Rx / DC Orders ED Discharge Orders     None         Jacalyn Lefevre, MD 12/05/23 1711

## 2023-12-05 NOTE — Consult Note (Addendum)
 Cardiology Consultation   Patient ID: [name] MRN: [id number]; DOB: October 09, 1933  Admit date: 12/05/2023 Date of Consult: 12/05/2023  PCP:  Lorenda Ishihara, MD   [facility] Providers Cardiologist:  Donato Schultz, MD        Patient Profile:   [name] is a 89 y.o. female who is being seen 12/05/2023 for the evaluation of atrial fibrillation with rapid ventricular response at the request of Dr. Isidoro Donning.  History of Present Illness:   [name] is an 89 year old female with atrial fibrillation rapid ventricular response, just discharged from [facility] yesterday after a 13-day stay who is having symptoms of chest tightness left arm discomfort palpitations nervousness.  She lives at [facility].  EMS was called and she was brought in for heart rates in the 150s with A-fib RVR.  With IV diltiazem at 5 mg/h she is now in the 70s to 80s.  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  Thrombocytopenia.  Failure to thrive.  Family members currently in the room.  Historical items provided by family members.  She is currently comfortable with heart rates in the 70s to 80s.  A-fib currently present.  Telemetry personally reviewed.   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   Breast cancer (HCC) 2010   RIGHT   CAD (coronary artery disease)    Dysuria    Family history of breast cancer    GERD (gastroesophageal reflux disease)    HX OF   Heart murmur    History of blood transfusion    History of breast cancer ONCOLOGIST-- DR Truett Perna   DX 2010--  RIGHT BREAST DCIS  HIGH GRADE S/P LUMPECTOMY W/ SLN BX--  NO RECURRENCE   History of kidney stones    History of ovarian cancer    2009--  S/P COLON RESECTION FOR PAPILLARY SEROUS CARCINOMA, BORDERLINE OVARIAN CANCER VERSUS PRIMARY PERITONEAL CARCINOMA WITH PSAMMOMA BODIES--  NO RECURRENCE    History of skin cancer    excision basal cell   Hyperlipidemia    Hypertension    Kidney stones 7/14   Nocturia    Ovarian carcinoma (HCC)    papillary serous-Powell grade, recurrence found in fat necrosis during ileostomy   Renal stones 2014   Shingles 30 YRS AGO   Ureteral calculi    bilateral    Past Surgical History:  Procedure Laterality Date   APPENDECTOMY     BREAST LUMPECTOMY Right 04-13-2009   W/ SLN BX   BREAST LUMPECTOMY WITH RADIOACTIVE SEED LOCALIZATION Left 06/11/2019   Procedure: LEFT BREAST LUMPECTOMY WITH RADIOACTIVE SEED LOCALIZATION;  Surgeon: Griselda Miner, MD;  Location: [facility];  Service: General;  Laterality: Left;   CARDIAC CATHETERIZATION  11-11-2005 DR Verdis Prime   MODERATE LAD DISEASE/ NORMAL LVF/ EF 65-75%   CATARACT EXTRACTION W/ INTRAOCULAR LENS IMPLANT Bilateral 04/26/2013   CHOLECYSTECTOMY  1990   OPEN   COLONOSCOPY     CYSTOSCOPY W/ URETERAL STENT PLACEMENT Bilateral 05/07/2013   Procedure: CYSTOSCOPY WITH STENT REPLACEMENTS;  Surgeon: Sebastian Ache, MD;  Location: [facility];  Service: Urology;  Laterality: Bilateral;   CYSTOSCOPY WITH BIOPSY N/A 04/07/2013   Procedure: CYSTOSCOPY WITH BIOPSY and fulgeration;  Surgeon: Sebastian Ache, MD;  Location: [facility];  Service: Urology;  Laterality: N/A;   CYSTOSCOPY WITH RETROGRADE PYELOGRAM, URETEROSCOPY AND STENT PLACEMENT Bilateral  05/07/2013   Procedure: CYSTOSCOPY WITH RETROGRADE PYELOGRAM, URETEROSCOPY ;  Surgeon: Sebastian Ache, MD;  Location: [facility];  Service: Urology;  Laterality: Bilateral;   CYSTOSCOPY WITH RETROGRADE PYELOGRAM, URETEROSCOPY AND STENT PLACEMENT Bilateral 01/06/2019   Procedure: CYSTOSCOPY WITH RETROGRADE PYELOGRAM, URETEROSCOPY AND STENT PLACEMENT, FIRST STAGE;  Surgeon: Sebastian Ache, MD;  Location: [facility];  Service: Urology;  Laterality: Bilateral;   CYSTOSCOPY WITH RETROGRADE PYELOGRAM, URETEROSCOPY AND STENT PLACEMENT Bilateral 01/27/2019   Procedure:  CYSTOSCOPY WITH RETROGRADE PYELOGRAM, URETEROSCOPY AND STENT PLACEMENT;  Surgeon: Sebastian Ache, MD;  Location: [facility];  Service: Urology;  Laterality: Bilateral;  75 MINS   CYSTOSCOPY WITH STENT PLACEMENT Bilateral 04/07/2013   Procedure: CYSTOSCOPY WITH STENT PLACEMENT;  Surgeon: Sebastian Ache, MD;  Location: [facility];  Service: Urology;  Laterality: Bilateral;   DX LAPAROSCOPY W/ PERITONEAL AND OMENTAL BX'S AND WASHINGS  02-16-2008   EXCISION RIGHT BREAST MASS  02-10-2002   EXP. LAP. EXTENSIVE ADHESIOLYSIS/ RESECTION TERMINAL ILEUM , ASCENDING AND DESCENDING COLON WITH CREATION ILEOSTOMY AND MUCOUS FISTULA  02-19-2008   PERFERATION AND ABD. CANCER--  TAKEDOWN ILEOSTOMY 08-23-2008   HOLMIUM LASER APPLICATION Bilateral 01/06/2019   Procedure: HOLMIUM LASER APPLICATION;  Surgeon: Sebastian Ache, MD;  Location: [facility];  Service: Urology;  Laterality: Bilateral;   HOLMIUM LASER APPLICATION Bilateral 01/27/2019   Procedure: HOLMIUM LASER APPLICATION;  Surgeon: Sebastian Ache, MD;  Location: [facility];  Service: Urology;  Laterality: Bilateral;   I & D EXTREMITY Right 02/02/2022   Procedure: IRRIGATION AND DEBRIDEMENT HAND;  Surgeon: Bradly Bienenstock, MD;  Location: [facility] OR;  Service: Orthopedics;  Laterality: Right;   ILEOSTOMY CLOSURE  08/2008   TOTAL ABDOMINAL HYSTERECTOMY W/ BILATERAL SALPINGOOPHORECTOMY  1976   TOTAL ABDOMINAL HYSTERECTOMY W/ BILATERAL SALPINGOOPHORECTOMY  1976   VULVAR LESION REMOVAL N/A 08/22/2014   Procedure: EXCISION OF VULVAR CYST  ;  Surgeon: Annamaria Boots, MD;  Location: [facility];  Service: Gynecology;  Laterality: N/A;   WOUND DEBRIDEMENT  05/28/2012   Procedure: DEBRIDEMENT ABDOMINAL WOUND;  Surgeon: Currie Paris, MD;  Location: [facility];  Service: General;  Laterality: N/A;  excision chronic wound abdominal wall     Home Medications:  Prior to Admission medications   Medication Sig Start Date End Date Taking? Authorizing Provider  aspirin EC 81 MG  tablet Take 81 mg by mouth daily.   Yes [provider]  BELSOMRA 10 MG TABS Take 10 mg by mouth at bedtime. 01/20/19  Yes [provider]  Calcium Carbonate-Vitamin D (CALTRATE 600+D PO) Take 1 tablet by mouth 2 (two) times daily.   Yes [provider]  carvedilol (COREG) 25 MG tablet Take 1 tablet (25 mg total) by mouth 2 (two) times daily with a meal. 02/27/22  Yes Lyn Records, MD  Cholecalciferol (VITAMIN D3) 50 MCG (2000 UT) TABS Take 2,000 Units by mouth in the morning and at bedtime.   Yes [provider]  cyanocobalamin (VITAMIN B12) 1000 MCG tablet Take 1,000 mcg by mouth daily.   Yes [provider]  dexamethasone (DECADRON) 4 MG tablet Take 1 tab (4 mg) twice a day for 3 days then take 1 tab daily Patient taking differently: Take 4 mg by mouth 2 (two) times daily. 10/21/23  Yes Rana Snare, NP  diltiazem (CARDIZEM SR) 90 MG 12 hr capsule Take 1 capsule (90 mg total) by mouth every 12 (twelve) hours. 12/04/23  Yes Briant Cedar, MD  hydroxychloroquine (PLAQUENIL) 200 MG  tablet Take 200 mg by mouth daily. 11/08/21  Yes [provider]  hydroxyurea (HYDREA) 500 MG capsule TAKE 1 CAPSULE BY MOUTH EVERY MONDAY, WEDNESDAY, AND FRIDAY. MAY TAKE WITH FOOD TO MINIMIZE GI SIDE EFFECTS Patient taking differently: Take 500 mg by mouth every Monday, Wednesday, and Friday. May take with food to minimize GI side effects. 10/23/23  Yes Ladene Artist, MD  isosorbide mononitrate (IMDUR) 60 MG 24 hr tablet Take 1 tablet (60 mg total) by mouth daily. 05/16/23  Yes Jake Bathe, MD  loperamide (IMODIUM) 2 MG capsule Take 1 capsule (2 mg total) by mouth as needed for diarrhea or loose stools. 12/04/23  Yes Briant Cedar, MD  Multiple Vitamins-Minerals (MULTIVITAMIN WOMEN 50+) TABS Take 1 tablet by mouth daily with breakfast.   Yes [provider]  oxyCODONE (OXY IR/ROXICODONE) 5 MG immediate release tablet Take 1 tablet (5 mg total) by  mouth every 4 (four) hours as needed for up to 5 days for severe pain (pain score 7-10) or moderate pain (pain score 4-6). May take 2 tablets if pain is severe.  Do not drive while taking pain medication. 12/04/23 12/09/23 Yes Briant Cedar, MD  pantoprazole (PROTONIX) 20 MG tablet Take 1 tablet (20 mg total) by mouth daily. 12/05/23  Yes Briant Cedar, MD  potassium chloride (KLOR-CON M) 10 MEQ tablet Take 1 tablet (10 meq) twice daily for 3 days, then 1 tablet daily. Patient taking differently: Take 10 mEq by mouth 2 (two) times daily. 11/07/23  Yes Rana Snare, NP  PROCTOZONE-HC 2.5 % rectal cream PLACE 1 APPLICATION RECTALLY 2 TIMES A DAY AS NEEDED FOR HEMORRHOIDS. Patient taking differently: Place 1 application  rectally 2 (two) times daily as needed for hemorrhoids. 01/09/15  Yes Cross, Melissa D, NP  rosuvastatin (CRESTOR) 10 MG tablet Take 10 mg by mouth at bedtime.   Yes [provider]  sucralfate (CARAFATE) 1 GM/10ML suspension Take 10 mLs (1 g total) by mouth 4 (four) times daily -  with meals and at bedtime. Patient taking differently: Take 1 g by mouth with breakfast, with lunch, and with evening meal. 12/04/23  Yes Briant Cedar, MD  tiZANidine (ZANAFLEX) 4 MG tablet Take 4 mg by mouth 2 (two) times daily as needed for muscle spasms. 10/10/23  Yes [provider]  triamcinolone (KENALOG) 0.1 % Apply 1 application  topically as needed (for rashes- affected areas). 04/04/20  Yes [provider]  abemaciclib (VERZENIO) 100 MG tablet Take 1 tablet (100 mg total) by mouth 2 (two) times daily. 11/07/23   Ladene Artist, MD  Wound Dressings (SONAFINE) Apply 1 Application topically 2 (two) times daily as needed (Apply to areas of redness from radiation as needed. Call Dr. Colletta Maryland nurse at [phone] if you have any questions). 12/04/23   Briant Cedar, MD    Inpatient Medications: Scheduled Meds:  Continuous Infusions:  diltiazem (CARDIZEM)  infusion 5 mg/hr (12/05/23 1556)   PRN Meds:   Allergies:    Allergies  Allergen Reactions   Ramipril Itching    Social History:   Social History   Socioeconomic History   Marital status: Widowed    Spouse name: Not on file   Number of children: Not on file   Years of education: Not on file   Highest education level: Not on file  Occupational History   Not on file  Tobacco Use   Smoking status: Never   Smokeless tobacco: Never  Vaping  Use   Vaping status: Never Used  Substance and Sexual Activity   Alcohol use: No   Drug use: No   Sexual activity: Never    Partners: Male    Birth control/protection: Surgical    Comment: Hysterectomy  Other Topics Concern   Not on file  Social History Narrative   Not on file   Social Drivers of Health   Financial Resource Strain: Not on file  Food Insecurity: No Food Insecurity (12/05/2023)   Hunger Vital Sign    Worried About Running Out of Food in the Last Year: Never true    Ran Out of Food in the Last Year: Never true  Transportation Needs: No Transportation Needs (12/05/2023)   PRAPARE - Transportation    Lack of Transportation (Medical): No    Lack of Transportation (Non-Medical): No  Physical Activity: Not on file  Stress: Not on file  Social Connections: Moderately Integrated (12/05/2023)   Social Connection and Isolation Panel [NHANES]    Frequency of Communication with Friends and Family: More than three times a week    Frequency of Social Gatherings with Friends and Family: More than three times a week    Attends Religious Services: More than 4 times per year    Active Member of [employer] or Organizations: Yes    Attends Banker Meetings: More than 4 times per year    Marital Status: Widowed  Intimate Partner Violence: Not At Risk (12/05/2023)   Humiliation, Afraid, Rape, and Kick questionnaire    Fear of Current or Ex-Partner: No    Emotionally Abused: No    Physically Abused: No    Sexually Abused: No     Family History:    Family History  Problem Relation Age of Onset   Heart disease Mother    Hyperlipidemia Mother    Breast cancer Sister    Cancer Sister        breast     ROS:  Please see the history of present illness.  No syncope, no bleeding. All other ROS reviewed and negative.     Physical Exam/Data:   Vitals:   12/05/23 1700 12/05/23 1730 12/05/23 1745 12/05/23 1822  BP: (!) 114/55 (!) 115/56 (!) 115/58 (!) 110/59  Pulse: 76 67 73 82  Resp: 15 12 15 16   Temp:    97.8 F (36.6 C)  TempSrc:    Oral  SpO2: 100% 99% 100%   Weight:    52.1 kg  Height:    5\' 6"  (1.676 m)    Intake/Output Summary (Last 24 hours) at 12/05/2023 1912 Last data filed at 12/05/2023 1850 Gross per 24 hour  Intake 513.85 ml  Output --  Net 513.85 ml      12/05/2023    6:22 PM 12/05/2023    3:04 PM 11/21/2023    3:25 PM  Last 3 Weights  Weight (lbs) 114 lb 14.4 oz 115 lb 117 lb 15.1 oz  Weight (kg) 52.118 kg 52.164 kg 53.5 kg     Body mass index is 18.55 kg/m.  General: Ill-appearing, and currently no distress HEENT: normal Neck: no JVD Vascular: No carotid bruits; Distal pulses 2+ bilaterally Cardiac: Irregularly irregular normal rate, no murmurs Lungs:  clear to auscultation bilaterally, no wheezing, rhonchi or rales  Abd: soft, nontender, no hepatomegaly  Ext: no edema Musculoskeletal:  No deformities, BUE and BLE strength normal and equal Skin: warm and dry  Neuro:  CNs 2-12 intact, no focal abnormalities noted Psych:  Normal affect   EKG:  The EKG was personally reviewed and demonstrates: Atrial fibrillation RVR Telemetry:  Telemetry was personally reviewed and demonstrates: Atrial fibrillation 70s to 80s currently  Relevant CV Studies: Echocardiogram 11/25/2023-EF 70% hypertrophic cardiomyopathy  CT abdomen pelvis from 11/22/2023-personally reviewed, significant aortic atherosclerosis noted.  There is also right coronary artery calcification.  Mitral valve calcification  noted as well especially in the annulus.  Possible lytic lesions appreciated in the T11 vertebral body.  Laboratory Data:  High Sensitivity Troponin:   Recent Labs  Lab 11/24/23 0711 11/24/23 1028 12/05/23 1536  TROPONINIHS 54* 50* 30*     Chemistry Recent Labs  Lab 12/03/23 0614 12/04/23 0604 12/05/23 1536  NA 137 135 135  K 2.9* 3.9 4.1  CL 114* 111 111  CO2 15* 18* 16*  GLUCOSE 104* 128* 166*  BUN 34* 33* 38*  CREATININE 1.15* 1.21* 1.44*  CALCIUM 7.5* 7.6* 7.9*  MG 1.4* 2.3  --   GFRNONAA 46* 43* 35*  ANIONGAP 8 6 8     Recent Labs  Lab 11/30/23 0537  PROT 4.2*  ALBUMIN 2.3*  AST 15  ALT 65*  ALKPHOS 64  BILITOT 0.7   Lipids No results for input(s): "CHOL", "TRIG", "HDL", "LABVLDL", "LDLCALC", "CHOLHDL" in the last 168 hours.  Hematology Recent Labs  Lab 12/03/23 0614 12/04/23 0604 12/05/23 1536  WBC 3.3* 3.2* 4.4  RBC 2.34* 2.41* 2.46*  HGB 7.8* 8.1* 8.3*  HCT 24.1* 24.6* 24.8*  MCV 103.0* 102.1* 100.8*  MCH 33.3 33.6 33.7  MCHC 32.4 32.9 33.5  RDW 16.8* 17.2* 17.2*  PLT 74* 83* 107*   Thyroid No results for input(s): "TSH", "FREET4" in the last 168 hours.  BNP Recent Labs  Lab 12/05/23 1536  BNP 998.6*    DDimer No results for input(s): "DDIMER" in the last 168 hours.   Radiology/Studies:  DG Chest Port 1 View Result Date: 12/05/2023 CLINICAL DATA:  Shortness of breath EXAM: PORTABLE CHEST 1 VIEW COMPARISON:  Chest radiograph dated 04/07/2013 FINDINGS: Powell lung volumes with bronchovascular crowding. Bibasilar patchy opacities and dense left retrocardiac opacity. Blunting of the bilateral costophrenic angles. No pneumothorax. The heart size and mediastinal contours are within normal limits. No acute osseous abnormality. IMPRESSION: 1. Powell lung volumes with bronchovascular crowding. Bibasilar patchy opacities and dense left retrocardiac opacity, which may represent atelectasis, aspiration, or pneumonia. 2. Blunting of the bilateral costophrenic  angles, which may represent small pleural effusions. Electronically Signed   By: Agustin Cree M.D.   On: 12/05/2023 16:33     Assessment and Plan:   Atrial fibrillation with rapid ventricular response -Currently rate controlled with IV diltiazem continue -Previously patient and family did not wish to pursue anticoagulation.  We discussed again.  I do not think that she would be a good candidate for anticoagulation given her multiple comorbidities and high risk for bleeding.  Has thrombocytopenia as it relates to chemotherapy as well.  She and family understand. -Would not pursue cardioversion.  Continue with rate control. -Convert to p.o. diltiazem when able.  She is currently on 5 mg IV per hour.  Conversion to Cardizem CD 180 mg a day tomorrow morning would be reasonable. - I would also suggest that when she is able to be discharged, short acting diltiazem 30 mg p.o. every 6 hours as needed should be given in case she has rapid rates.  Goals of care - Currently DNR-limited DO NOT INTUBATE/DNI  Metastatic breast cancer - Prior oncology notes reviewed.  Thrombocytopenia - High medication risk with anticoagulation.  Would avoid.  We discussed possibility of stroke.  High risk medication management/medical decision making  Risk Assessment/Risk Scores:           For questions or updates, please contact [facility] Please consult [email or web address] for contact info under    Signed, Donato Schultz, MD  12/05/2023 7:12 PM

## 2023-12-06 ENCOUNTER — Encounter (HOSPITAL_COMMUNITY): Payer: Self-pay | Admitting: Internal Medicine

## 2023-12-06 ENCOUNTER — Other Ambulatory Visit: Payer: Self-pay

## 2023-12-06 DIAGNOSIS — D696 Thrombocytopenia, unspecified: Secondary | ICD-10-CM

## 2023-12-06 DIAGNOSIS — I4891 Unspecified atrial fibrillation: Secondary | ICD-10-CM | POA: Diagnosis not present

## 2023-12-06 DIAGNOSIS — D649 Anemia, unspecified: Secondary | ICD-10-CM | POA: Diagnosis not present

## 2023-12-06 LAB — COMPREHENSIVE METABOLIC PANEL
ALT: 29 U/L (ref 0–44)
AST: 14 U/L — ABNORMAL LOW (ref 15–41)
Albumin: 2.1 g/dL — ABNORMAL LOW (ref 3.5–5.0)
Alkaline Phosphatase: 61 U/L (ref 38–126)
Anion gap: 9 (ref 5–15)
BUN: 41 mg/dL — ABNORMAL HIGH (ref 8–23)
CO2: 14 mmol/L — ABNORMAL LOW (ref 22–32)
Calcium: 8 mg/dL — ABNORMAL LOW (ref 8.9–10.3)
Chloride: 109 mmol/L (ref 98–111)
Creatinine, Ser: 1.5 mg/dL — ABNORMAL HIGH (ref 0.44–1.00)
GFR, Estimated: 34 mL/min — ABNORMAL LOW (ref >60)
Glucose, Bld: 171 mg/dL — ABNORMAL HIGH (ref 70–99)
Potassium: 4.2 mmol/L (ref 3.5–5.1)
Sodium: 132 mmol/L — ABNORMAL LOW (ref 135–145)
Total Bilirubin: 0.6 mg/dL (ref 0.0–1.2)
Total Protein: 4 g/dL — ABNORMAL LOW (ref 6.5–8.1)

## 2023-12-06 LAB — CBC
HCT: 22.6 % — ABNORMAL LOW (ref 36.0–46.0)
Hemoglobin: 7.6 g/dL — ABNORMAL LOW (ref 12.0–15.0)
MCH: 33.5 pg (ref 26.0–34.0)
MCHC: 33.6 g/dL (ref 30.0–36.0)
MCV: 99.6 fL (ref 80.0–100.0)
Platelets: 96 10*3/uL — ABNORMAL LOW (ref 150–400)
RBC: 2.27 MIL/uL — ABNORMAL LOW (ref 3.87–5.11)
RDW: 17.3 % — ABNORMAL HIGH (ref 11.5–15.5)
WBC: 4.1 10*3/uL (ref 4.0–10.5)
nRBC: 0 % (ref 0.0–0.2)

## 2023-12-06 LAB — MRSA NEXT GEN BY PCR, NASAL: MRSA by PCR Next Gen: NOT DETECTED

## 2023-12-06 MED ORDER — HYDROXYCHLOROQUINE SULFATE 200 MG PO TABS
200.0000 mg | ORAL_TABLET | Freq: Every day | ORAL | Status: DC
  Administered 2023-12-06 – 2023-12-09 (×4): 200 mg via ORAL
  Filled 2023-12-06 (×5): qty 1

## 2023-12-06 MED ORDER — ISOSORBIDE MONONITRATE ER 60 MG PO TB24
60.0000 mg | ORAL_TABLET | Freq: Every day | ORAL | Status: DC
  Administered 2023-12-06 – 2023-12-09 (×4): 60 mg via ORAL
  Filled 2023-12-06 (×5): qty 1

## 2023-12-06 MED ORDER — ENSURE ENLIVE PO LIQD
237.0000 mL | Freq: Two times a day (BID) | ORAL | Status: DC
  Administered 2023-12-07 – 2023-12-08 (×2): 237 mL via ORAL

## 2023-12-06 MED ORDER — DILTIAZEM HCL ER COATED BEADS 180 MG PO CP24
180.0000 mg | ORAL_CAPSULE | Freq: Every day | ORAL | Status: DC
  Administered 2023-12-06: 180 mg via ORAL
  Filled 2023-12-06: qty 1

## 2023-12-06 MED ORDER — DEXAMETHASONE 4 MG PO TABS
4.0000 mg | ORAL_TABLET | Freq: Two times a day (BID) | ORAL | Status: DC
  Administered 2023-12-06 – 2023-12-09 (×7): 4 mg via ORAL
  Filled 2023-12-06 (×7): qty 1

## 2023-12-06 NOTE — Evaluation (Signed)
 Occupational Therapy Evaluation Patient Details Name: [name] MRN: [id number] DOB: 1933-12-15 Today's Date: 12/06/2023   History of Present Illness   [name] is a 89 y.o. female with PMH significant of coronary artery disease, breast cancer, left, ER positive, atrial fibrillation, hypertrophic cardiomyopathy, dysphagia who was just discharged yesterday 12/04/2023 from [facility] (admitted 2/14 -12/04/2023 with acute kidney injury, diarrhea, found to have new onset atrial fibrillation with RVR).  Patient reported that she was in her normal baseline health until 2 PM today when she started having sudden palpitations, chest tightness, radiating to the left arm, shortness of breath while she was eating her soup.  Denied any dizziness, lightheadedness or syncopal episode.  EMS was called as patient was having progressively worsening shortness of breath and chest pain, received 2 sublingual nitroglycerin at the facility with no relief.  Patient was brought to the ER and was found to be in rapid atrial fibrillation with RVR with heart rate in 150s.     Clinical Impressions Pt. Was cooperative during evaluation. Pt. Plans on dc back to [facility] for rehab. Pt. Has decreased I and safety with ADLs and ADL transfers. Pt. Has decreased sitting and standing tolerance. Pt. To continue with OT while in hospital.      If plan is discharge home, recommend the following:   A little help with bathing/dressing/bathroom;Assistance with cooking/housework;A little help with walking and/or transfers     Functional Status Assessment   Patient has had a recent decline in their functional status and demonstrates the ability to make significant improvements in function in a reasonable and predictable amount of time.     Equipment Recommendations   None recommended by OT     Recommendations for Other Services         Precautions/Restrictions   Precautions Precautions:  Fall Restrictions Weight Bearing Restrictions Per Provider Order: No     Mobility Bed Mobility Overal bed mobility: Needs Assistance Bed Mobility: Supine to Sit     Supine to sit: Contact guard Sit to supine: Contact guard assist   General bed mobility comments: using bed rails    Transfers Overall transfer level: Needs assistance Equipment used: Rolling walker (2 wheels) Transfers: Sit to/from Stand Sit to Stand: Min assist     Step pivot transfers: Min assist     General transfer comment: Pt. limited with standing secondary to weakness      Balance   Sitting-balance support: No upper extremity supported Sitting balance-Leahy Scale: Fair     Standing balance support: Bilateral upper extremity supported, Reliant on assistive device for balance Standing balance-Leahy Scale: Fair                             ADL either performed or assessed with clinical judgement   ADL Overall ADL's : Needs assistance/impaired Eating/Feeding: Independent   Grooming: Wash/dry hands;Sitting;Set up   Upper Body Bathing: Supervision/ safety;Sitting   Lower Body Bathing: Minimal assistance;Sit to/from stand   Upper Body Dressing : Minimal assistance;Sitting   Lower Body Dressing: Minimal assistance;Sit to/from stand   Toilet Transfer: Minimal assistance;BSC/3in1   Toileting- Clothing Manipulation and Hygiene: Minimal assistance;Sit to/from stand       Functional mobility during ADLs: Minimal assistance;Rolling walker (2 wheels) General ADL Comments: able to sit EOB without assist     Vision Baseline Vision/History: 0 No visual deficits Ability to See in Adequate Light: 0 Adequate Patient Visual Report:  No change from baseline       Perception         Praxis         Pertinent Vitals/Pain Pain Assessment Pain Assessment: No/denies pain     Extremity/Trunk Assessment Upper Extremity Assessment Upper Extremity Assessment: Generalized weakness            Communication     Cognition Arousal: Alert Behavior During Therapy: WFL for tasks assessed/performed Cognition: No apparent impairments                               Following commands: Intact       Cueing  General Comments   Cueing Techniques: Verbal cues      Exercises     Shoulder Instructions      Home Living Family/patient expects to be discharged to:: Skilled nursing facility Living Arrangements:  (Pt. was at SNF for 1 day and than was readmitted to hospital.)                                      Prior Functioning/Environment Prior Level of Function : Driving;Independent/Modified Independent               ADLs Comments: Housekeeper 1x/month. Does not have to do yard work.    OT Problem List:     OT Treatment/Interventions: Self-care/ADL training;Therapeutic activities;Therapeutic exercise;Patient/family education;Energy conservation;DME and/or AE instruction;Balance training      OT Goals(Current goals can be found in the care plan section)   Acute Rehab OT Goals Patient Stated Goal: get stronger and go home. OT Goal Formulation: With patient Time For Goal Achievement: 12/20/23 Potential to Achieve Goals: Good ADL Goals Pt Will Perform Grooming: with supervision;standing Pt Will Perform Upper Body Bathing: with supervision;sitting Pt Will Perform Lower Body Bathing: with supervision;sit to/from stand Pt Will Perform Upper Body Dressing: with supervision;sitting Pt Will Perform Lower Body Dressing: with supervision;sit to/from stand Pt Will Transfer to Toilet: with supervision;ambulating Pt Will Perform Toileting - Clothing Manipulation and hygiene: with supervision;sit to/from stand   OT Frequency:  Min 2X/week    Co-evaluation              AM-PAC OT "6 Clicks" Daily Activity     Outcome Measure Help from another person eating meals?: None Help from another person taking care of personal grooming?:  A Little Help from another person toileting, which includes using toliet, bedpan, or urinal?: A Lot Help from another person bathing (including washing, rinsing, drying)?: A Little Help from another person to put on and taking off regular upper body clothing?: A Little Help from another person to put on and taking off regular lower body clothing?: A Little 6 Click Score: 18   End of Session Equipment Utilized During Treatment: Rolling walker (2 wheels);Gait belt Nurse Communication:  (ok therapy)  Activity Tolerance: Patient tolerated treatment well Patient left: in bed;with call bell/phone within reach;with bed alarm set;with family/visitor present  OT Visit Diagnosis: Unsteadiness on feet (R26.81);Pain;Muscle weakness (generalized) (M62.81)                Time: 1110-1141 OT Time Calculation (min): 31 min Charges:  OT General Charges $OT Visit: 1 Visit OT Evaluation $OT Eval Moderate Complexity: 1 Mod OT Treatments $Self Care/Home Management : 8-22 mins  Khaliyah Northrop, ot  Eusevio Schriver 12/06/2023, 11:42 AM

## 2023-12-06 NOTE — Progress Notes (Signed)
 Mobility Specialist Progress Note;   12/06/23 1014  Mobility  Activity Ambulated with assistance in room;Ambulated with assistance in hallway  Level of Assistance Contact guard assist, steadying assist  Assistive Device Front wheel walker  Distance Ambulated (ft) 40 ft  Activity Response Tolerated well  Mobility Referral Yes  Mobility visit 1 Mobility  Mobility Specialist Start Time (ACUTE ONLY) 1014  Mobility Specialist Stop Time (ACUTE ONLY) 1026  Mobility Specialist Time Calculation (min) (ACUTE ONLY) 12 min   Pt agreeable to mobility. Required MinG assistance during ambulation for safety. VSS on RA. Pt displayed some fatigue once returned back to bed, otherwise no c/o. Pt left comfortably in bed with all needs met, alarm on. Visitor in room.   Caesar Bookman Mobility Specialist Please contact via SecureChat or [employer] [phone]

## 2023-12-06 NOTE — Plan of Care (Signed)
  Problem: Health Behavior/Discharge Planning: Goal: Ability to manage health-related needs will improve Outcome: Progressing   Problem: Clinical Measurements: Goal: Respiratory complications will improve Outcome: Progressing Goal: Cardiovascular complication will be avoided Outcome: Progressing   Problem: Coping: Goal: Level of anxiety will decrease Outcome: Progressing   Problem: Safety: Goal: Ability to remain free from injury will improve Outcome: Progressing   Problem: Cardiac: Goal: Ability to achieve and maintain adequate cardiopulmonary perfusion will improve Outcome: Progressing   Problem: Health Behavior/Discharge Planning: Goal: Ability to manage health-related needs will improve Outcome: Progressing   Problem: Clinical Measurements: Goal: Respiratory complications will improve Outcome: Progressing Goal: Cardiovascular complication will be avoided Outcome: Progressing   Problem: Coping: Goal: Level of anxiety will decrease Outcome: Progressing   Problem: Safety: Goal: Ability to remain free from injury will improve Outcome: Progressing   Problem: Cardiac: Goal: Ability to achieve and maintain adequate cardiopulmonary perfusion will improve Outcome: Progressing

## 2023-12-06 NOTE — Evaluation (Signed)
 Physical Therapy Evaluation Patient Details Name: [name] MRN: [id number] DOB: [birth year] Today's Date: 12/06/2023  History of Present Illness  LOLITA FAULDS is a 89 y.o. female adm 12/05/23 after she started having sudden palpitations, chest tightness, radiating to the left arm, shortness of breath. +afib RVR PMH significant of coronary artery disease, breast cancer, left, atrial fibrillation, hypertrophic cardiomyopathy, dysphagia who was just discharged yesterday 12/04/2023 from [facility]  Clinical Impression   Pt admitted secondary to problem above with deficits below. PTA patient was admitted to SNF for rehab x 1 day and returned to hospital due to afib RVR. Pt currently requires up to +2 mod assist to stand from standard toilet, min assist from recliner with armrests, and min assist to ambulate with RW x 100 ft. Anticipate patient will benefit from PT to address problems listed below.Will continue to follow acutely to maximize functional mobility independence and safety. Patient will benefit from continued inpatient follow up therapy, <3 hours/day          If plan is discharge home, recommend the following: A little help with walking and/or transfers;A little help with bathing/dressing/bathroom;Assistance with cooking/housework;Help with stairs or ramp for entrance;Assist for transportation   Can travel by private vehicle   Yes    Equipment Recommendations Rolling walker (2 wheels)  Recommendations for Other Services       Functional Status Assessment Patient has had a recent decline in their functional status and demonstrates the ability to make significant improvements in function in a reasonable and predictable amount of time.     Precautions / Restrictions Precautions Precautions: Fall Recall of Precautions/Restrictions: Intact Restrictions Weight Bearing Restrictions Per Provider Order: No      Mobility  Bed Mobility Overal bed mobility: Needs  Assistance Bed Mobility: Supine to Sit     Supine to sit: Contact guard Sit to supine: Contact guard assist   General bed mobility comments: using bed rails; incr time and effort    Transfers Overall transfer level: Needs assistance Equipment used: Rolling walker (2 wheels) Transfers: Sit to/from Stand Sit to Stand: Min assist, Mod assist, +2 physical assistance           General transfer comment: stood from EOB and recliner with min assist; from standard toilet with grab bar could not stand with 1 person assist; +2 mod assist    Ambulation/Gait Ambulation/Gait assistance: Contact guard assist, Min assist Gait Distance (Feet): 100 Feet Assistive device: Rolling walker (2 wheels) Gait Pattern/deviations: Step-through pattern, Decreased stride length, Trunk flexed Gait velocity: decr     General Gait Details: Assist to stabilize pt and manage RW throughout distance. Pt fatigues very quickly/easily. Fall risk.  Stairs            Wheelchair Mobility     Tilt Bed    Modified Rankin (Stroke Patients Only)       Balance Overall balance assessment: Needs assistance Sitting-balance support: No upper extremity supported Sitting balance-Leahy Scale: Fair     Standing balance support: Bilateral upper extremity supported, Reliant on assistive device for balance Standing balance-Leahy Scale: Poor                               Pertinent Vitals/Pain Pain Assessment Pain Assessment: No/denies pain    Home Living Family/patient expects to be discharged to:: Skilled nursing facility Living Arrangements:  (Pt. was at SNF for 1 day and than was readmitted to hospital.)  Prior Function Prior Level of Function : Driving;Independent/Modified Independent             Mobility Comments: no device prior to last admission ADLs Comments: Housekeeper 1x/month. Does not have to do yard work.     Extremity/Trunk Assessment    Upper Extremity Assessment Upper Extremity Assessment: Generalized weakness    Lower Extremity Assessment Lower Extremity Assessment: Generalized weakness (+2 assist to get off standard toilet)    Cervical / Trunk Assessment Cervical / Trunk Assessment: Kyphotic  Communication   Communication Communication: No apparent difficulties    Cognition Arousal: Alert Behavior During Therapy: WFL for tasks assessed/performed   PT - Cognitive impairments: No apparent impairments                         Following commands: Intact       Cueing Cueing Techniques: Verbal cues     General Comments General comments (skin integrity, edema, etc.): HR 77-84    Exercises     Assessment/Plan    PT Assessment Patient needs continued PT services  PT Problem List Decreased mobility;Decreased activity tolerance;Decreased strength;Decreased balance;Decreased knowledge of use of DME;Cardiopulmonary status limiting activity       PT Treatment Interventions DME instruction;Gait training;Balance training;Functional mobility training;Therapeutic exercise;Therapeutic activities;Patient/family education;Stair training    PT Goals (Current goals can be found in the Care Plan section)  Acute Rehab PT Goals Patient Stated Goal: get stronger; return to rehab PT Goal Formulation: With patient Time For Goal Achievement: 12/20/23 Potential to Achieve Goals: Good    Frequency Min 1X/week     Co-evaluation               AM-PAC PT "6 Clicks" Mobility  Outcome Measure Help needed turning from your back to your side while in a flat bed without using bedrails?: A Little Help needed moving from lying on your back to sitting on the side of a flat bed without using bedrails?: A Little Help needed moving to and from a bed to a chair (including a wheelchair)?: A Little Help needed standing up from a chair using your arms (e.g., wheelchair or bedside chair)?: A Little Help needed to walk in  hospital room?: A Little Help needed climbing 3-5 steps with a railing? : Total 6 Click Score: 16    End of Session Equipment Utilized During Treatment: Gait belt Activity Tolerance: Patient limited by fatigue Patient left: in chair;with call bell/phone within reach;with family/visitor present Nurse Communication: Mobility status PT Visit Diagnosis: Difficulty in walking, not elsewhere classified (R26.2)    Time: 0981-1914 PT Time Calculation (min) (ACUTE ONLY): 22 min   Charges:   PT Evaluation $PT Eval Low Complexity: 1 Low   PT General Charges $$ ACUTE PT VISIT: 1 Visit          Jerolyn Center, PT Acute Rehabilitation Services  Office [phone]   Zena Amos 12/06/2023, 3:06 PM

## 2023-12-06 NOTE — Progress Notes (Signed)
 PROGRESS NOTE    CHANEL MCADAMS  JYN:[id number] DOB: 09-15-1934 DOA: 12/05/2023 PCP: Lorenda Ishihara, MD     Brief Narrative:  [name] is a 89 y.o. female with PMH significant of coronary artery disease, breast cancer, left, ER positive, atrial fibrillation, hypertrophic cardiomyopathy, dysphagia who was just discharged yesterday 12/04/2023 from [facility] (admitted 2/14 -12/04/2023 with acute kidney injury, diarrhea, found to have new onset atrial fibrillation with RVR).  Patient reported that she was in her normal baseline health until 2 PM today when she started having sudden palpitations, chest tightness, radiating to the left arm, shortness of breath while she was eating her soup.  Denied any dizziness, lightheadedness or syncopal episode.  EMS was called as patient was having progressively worsening shortness of breath and chest pain, received 2 sublingual nitroglycerin at the facility with no relief.  Patient was brought to the ER and was found to be in rapid atrial fibrillation with RVR with heart rate in 150s.  She was placed on IV Cardizem drip.   New events last 24 hours / Subjective: Patient feeling well, started on oral Cardizem and weaning off IV Cardizem drip.  Remains in a regular rhythm, rate much better controlled in the 70s.  She has no physical complaints.   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  Per discharge summary, anticoagulation was discussed with the family and was declined.  Patient was discharged on Coreg and diltiazem -2D echo on 2/18 had shown EF of 65 to 70%, no regional WMA -Transitioned to oral Cardizem -Baby aspirin   Active Problems: Acute kidney injury on CKD stage IIIa -Baseline creatinine 1.2 -Avoid hypotension, nephrotoxic medications, and the previous admission had discontinued chlorthalidone   Hypertension -Stable on oral Cardizem   Coronary artery disease, hyperlipidemia History of hypertrophic cardiomyopathy -Has history of chronic stable angina, chest tightness today with palpitations and shortness of breath due to #1 -Troponin 30, obtain serial troponin, recent echo 2/18 had shown EF of 65 to 70%, no regional WMA -During previous admission, cardiology had recommended against cardiac MRI or invasive testing -Aspirin, Imdur   Carcinoma of upper-outer quadrant of left breast in female, estrogen receptor positive (HCC), 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 -Verzenio currently on hold -Outpatient follow-up with oncology, radiation oncology, palliative medicine -Continue pain control   Pancytopenia with anemia, thrombocytopenia macrocytosis -Improving, during previous admission, this was discussed with hematology and was thought likely due to [facility] and probably bone marrow infiltration, recommended supportive care   In agreement with assessment of the pressure ulcer as below:  Pressure Injury 12/05/23 Buttocks Left Stage 2 -  Partial thickness loss of dermis presenting as a shallow open injury with a red, pink wound bed without slough. 1cm x 1cm round opening on left bottoc with blachable redness surrounding the wound. (Active)  12/05/23 1843  Location: Buttocks  Location Orientation: Left  Staging:  Stage 2 -  Partial thickness loss of dermis presenting as a shallow open  injury with a red, pink wound bed without slough.  Wound Description (Comments): 1cm x 1cm round opening on left bottoc with blachable redness surrounding the wound.  Present on Admission:   Dressing Type Foam - Lift dressing to assess site every shift 12/06/23 0500     Pressure Injury 12/05/23 Vertebral column Medial Stage 1 -  Intact skin with non-blanchable redness of a localized area usually over a bony prominence. blanchable erythema at the throacic spine (Active)  12/05/23 1847  Location: Vertebral column  Location Orientation: Medial  Staging: Stage 1 -  Intact skin with non-blanchable redness of a localized area usually over a bony prominence.  Wound Description (Comments): blanchable erythema at the throacic spine  Present on Admission: Yes  Dressing Type Foam - Lift dressing to assess site every shift 12/06/23 0701         DVT prophylaxis:  SCDs Start: 12/05/23 2220  Code Status: DNR Family Communication: At bedside Disposition Plan: SNF, [facility] Status is: Inpatient Remains inpatient appropriate because: Continue to wean off Cardizem drip    Antimicrobials:  Anti-infectives (From admission, onward)    None        Objective: Vitals:   12/05/23 2335 12/06/23 0311 12/06/23 0901 12/06/23 1317  BP: 135/72 (!) 140/58 (!) 146/68 (!) 141/69  Pulse: 83 70    Resp: 19 20 14 17   Temp: 97.6 F (36.4 C) 97.9 F (36.6 C) (!) 97.5 F (36.4 C) 98.3 F (36.8 C)  TempSrc: Oral Oral Oral   SpO2: 98% 100%  100%  Weight:      Height:        Intake/Output Summary (Last 24 hours) at 12/06/2023 1337 Last data filed at 12/06/2023 0400 Gross per 24 hour  Intake 557.16 ml  Output --  Net 557.16 ml   Filed Weights   12/05/23 1504 12/05/23 1822  Weight: 52.2 kg 52.1 kg    Examination:  General exam: Appears calm and comfortable  Respiratory system: Clear to auscultation. Respiratory effort normal. No respiratory distress. No conversational dyspnea.  Cardiovascular  system: S1 & S2 heard, irregular rhythm, rate 70 Gastrointestinal system: Abdomen is nondistended, soft and nontender. Normal bowel sounds heard. Central nervous system: Alert and oriented.  Psychiatry:  Mood & affect appropriate.   Data Reviewed: I have personally reviewed following labs and imaging studies  CBC: Recent Labs  Lab 11/30/23 1310 12/01/23 0505 12/02/23 0501 12/03/23 0614 12/04/23 0604 12/05/23 1536 12/06/23 0424  WBC 4.0   < > 3.5* 3.3* 3.2* 4.4 4.1  NEUTROABS 3.6  --   --   --  2.6 3.9  --   HGB 7.8*   < > 8.0* 7.8* 8.1* 8.3* 7.6*  HCT 24.4*   < > 24.8* 24.1* 24.6* 24.8* 22.6*  MCV 103.8*   < > 103.3* 103.0* 102.1* 100.8* 99.6  PLT 70*   < > 76* 74* 83* 107* 96*   < > = values in this interval not displayed.   Basic Metabolic Panel: Recent Labs  Lab 12/02/23 0501 12/03/23 0614 12/04/23 0604 12/05/23 1536 12/06/23 0424  NA 140 137 135 135 132*  K 3.4* 2.9* 3.9 4.1 4.2  CL 113* 114* 111 111 109  CO2 19* 15* 18* 16* 14*  GLUCOSE 101* 104* 128* 166* 171*  BUN 35* 34* 33* 38* 41*  CREATININE 1.27* 1.15* 1.21* 1.44* 1.50*  CALCIUM 8.0* 7.5* 7.6* 7.9* 8.0*  MG  --  1.4* 2.3  --   --    GFR: Estimated Creatinine Clearance: 21.7 mL/min (A) (by C-G formula based on SCr of 1.5 mg/dL (H)). Liver Function Tests: Recent Labs  Lab 11/30/23 0537 12/06/23 0424  AST 15 14*  ALT 65* 29  ALKPHOS 64 61  BILITOT 0.7 0.6  PROT 4.2* 4.0*  ALBUMIN 2.3* 2.1*   No results for input(s): "LIPASE", "AMYLASE" in the last 168 hours. No results for input(s): "AMMONIA" in the last 168 hours. Coagulation Profile: No results for input(s): "INR", "PROTIME" in the last 168 hours. Cardiac Enzymes: No results for input(s): "CKTOTAL", "CKMB", "CKMBINDEX", "TROPONINI" in the last 168 hours. BNP (last 3 results) No results for input(s): "PROBNP" in the last 8760 hours. HbA1C: No results for input(s): "HGBA1C" in the last 72 hours. CBG: Recent Labs  Lab 12/05/23 1556   GLUCAP 152*   Lipid Profile: No results for input(s): "CHOL", "HDL", "LDLCALC", "TRIG", "CHOLHDL", "LDLDIRECT" in the last 72 hours. Thyroid Function Tests: No results for input(s): "TSH", "T4TOTAL", "FREET4", "T3FREE", "THYROIDAB" in the last 72 hours. Anemia Panel: No results for input(s): "VITAMINB12", "FOLATE", "FERRITIN", "TIBC", "IRON", "RETICCTPCT" in the last 72 hours. Sepsis Labs: No results for input(s): "PROCALCITON", "LATICACIDVEN" in the last 168 hours.  Recent Results (from the past 240 hours)  MRSA Next Gen by PCR, Nasal     Status: None   Collection Time: 12/06/23  6:10 AM   Specimen: Nasal Mucosa; Nasal Swab  Result Value Ref Range Status   MRSA by PCR Next Gen NOT DETECTED NOT DETECTED Final    Comment: (NOTE) The GeneXpert MRSA Assay (FDA approved for NASAL specimens only), is one component of a comprehensive MRSA colonization surveillance program. It is not intended to diagnose MRSA infection nor to guide or monitor treatment for MRSA infections. Test performance is not FDA approved in patients less than 3 years old. Performed at [facility], [address], [place], [place] [address]       Radiology Studies: DG Chest Port 1 View Result Date: 12/05/2023 CLINICAL DATA:  Shortness of breath EXAM: PORTABLE CHEST 1 VIEW COMPARISON:  Chest radiograph dated 04/07/2013 FINDINGS: Low lung volumes with bronchovascular crowding. Bibasilar patchy opacities and dense left retrocardiac opacity. Blunting of the bilateral costophrenic angles. No pneumothorax. The heart size and mediastinal contours are within normal limits. No acute osseous abnormality. IMPRESSION: 1. Low lung volumes with bronchovascular crowding. Bibasilar patchy opacities and dense left retrocardiac opacity, which may represent atelectasis, aspiration, or pneumonia. 2. Blunting of the bilateral costophrenic angles, which may represent small pleural effusions. Electronically Signed   By: Agustin Cree M.D.    On: 12/05/2023 16:33      Scheduled Meds:  aspirin EC  81 mg Oral Daily   cyanocobalamin  1,000 mcg Oral Daily   diltiazem  180 mg Oral Daily   pantoprazole  20 mg Oral Daily   rosuvastatin  10 mg Oral QODAY   sucralfate  1 g Oral TID WC & HS   Continuous Infusions:   LOS: 1 day   Time spent: 35 minutes   Noralee Stain, DO [facility] 12/06/2023, 1:38 PM   Available via Epic secure chat 7am-7pm After these hours, please refer to coverage provider listed on [email or web address]

## 2023-12-06 NOTE — Progress Notes (Signed)
 Rounding Note    Patient Name: [name] Date of Encounter: 12/06/2023  [facility] Cardiologist: Donato Schultz, MD   Subjective   BP 140/58.  Worsening renal function (creatinine 1.44 > 1.5).  Hemoglobin 7.6, platelets 96.  Reports chest pain resolved.  Denies any dyspnea  Inpatient Medications    Scheduled Meds:  aspirin EC  81 mg Oral Daily   cyanocobalamin  1,000 mcg Oral Daily   pantoprazole  20 mg Oral Daily   rosuvastatin  10 mg Oral QODAY   sucralfate  1 g Oral TID WC & HS   Continuous Infusions:  diltiazem (CARDIZEM) infusion 5 mg/hr (12/05/23 1556)   PRN Meds: acetaminophen **OR** acetaminophen, melatonin, ondansetron **OR** ondansetron (ZOFRAN) IV, oxyCODONE, polyethylene glycol   Vital Signs    Vitals:   12/05/23 1822 12/05/23 2033 12/05/23 2335 12/06/23 0311  BP: (!) 110/59 (!) 111/44 135/72 (!) 140/58  Pulse: 82 69 83 70  Resp: 16 20 19 20   Temp: 97.8 F (36.6 C) (!) 97.3 F (36.3 C) 97.6 F (36.4 C) 97.9 F (36.6 C)  TempSrc: Oral Oral Oral Oral  SpO2:  99% 98% 100%  Weight: 52.1 kg     Height: 5\' 6"  (1.676 m)       Intake/Output Summary (Last 24 hours) at 12/06/2023 0749 Last data filed at 12/06/2023 0400 Gross per 24 hour  Intake 557.16 ml  Output --  Net 557.16 ml      12/05/2023    6:22 PM 12/05/2023    3:04 PM 11/21/2023    3:25 PM  Last 3 Weights  Weight (lbs) 114 lb 14.4 oz 115 lb 117 lb 15.1 oz  Weight (kg) 52.118 kg 52.164 kg 53.5 kg      Telemetry    A-fib with rate 60s to 70s- Personally Reviewed  ECG    No new ECG- Personally Reviewed  Physical Exam   GEN: No acute distress.   Neck: No JVD Cardiac: Irregular, normal rate no murmurs, rubs, or gallops.  Respiratory: Clear to auscultation bilaterally. GI: Soft, nontender, non-distended  MS: Trace edema; No deformity. Neuro:  Nonfocal  Psych: Normal affect   Labs    High Sensitivity Troponin:   Recent Labs  Lab 11/24/23 0711 11/24/23 1028  12/05/23 1536 12/05/23 1721  TROPONINIHS 54* 50* 30* 35*     Chemistry Recent Labs  Lab 11/30/23 0537 12/01/23 0505 12/03/23 0614 12/04/23 0604 12/05/23 1536 12/06/23 0424  NA 138   < > 137 135 135 132*  K 4.3   < > 2.9* 3.9 4.1 4.2  CL 112*   < > 114* 111 111 109  CO2 20*   < > 15* 18* 16* 14*  GLUCOSE 115*   < > 104* 128* 166* 171*  BUN 35*   < > 34* 33* 38* 41*  CREATININE 1.22*   < > 1.15* 1.21* 1.44* 1.50*  CALCIUM 8.0*   < > 7.5* 7.6* 7.9* 8.0*  MG  --   --  1.4* 2.3  --   --   PROT 4.2*  --   --   --   --  4.0*  ALBUMIN 2.3*  --   --   --   --  2.1*  AST 15  --   --   --   --  14*  ALT 65*  --   --   --   --  29  ALKPHOS 64  --   --   --   --  61  BILITOT 0.7  --   --   --   --  0.6  GFRNONAA 43*   < > 46* 43* 35* 34*  ANIONGAP 6   < > 8 6 8 9    < > = values in this interval not displayed.    Lipids No results for input(s): "CHOL", "TRIG", "HDL", "LABVLDL", "LDLCALC", "CHOLHDL" in the last 168 hours.  Hematology Recent Labs  Lab 12/04/23 0604 12/05/23 1536 12/06/23 0424  WBC 3.2* 4.4 4.1  RBC 2.41* 2.46* 2.27*  HGB 8.1* 8.3* 7.6*  HCT 24.6* 24.8* 22.6*  MCV 102.1* 100.8* 99.6  MCH 33.6 33.7 33.5  MCHC 32.9 33.5 33.6  RDW 17.2* 17.2* 17.3*  PLT 83* 107* 96*   Thyroid No results for input(s): "TSH", "FREET4" in the last 168 hours.  BNP Recent Labs  Lab 12/05/23 1536  BNP 998.6*    DDimer No results for input(s): "DDIMER" in the last 168 hours.   Radiology    DG Chest Port 1 View Result Date: 12/05/2023 CLINICAL DATA:  Shortness of breath EXAM: PORTABLE CHEST 1 VIEW COMPARISON:  Chest radiograph dated 04/07/2013 FINDINGS: Low lung volumes with bronchovascular crowding. Bibasilar patchy opacities and dense left retrocardiac opacity. Blunting of the bilateral costophrenic angles. No pneumothorax. The heart size and mediastinal contours are within normal limits. No acute osseous abnormality. IMPRESSION: 1. Low lung volumes with bronchovascular crowding.  Bibasilar patchy opacities and dense left retrocardiac opacity, which may represent atelectasis, aspiration, or pneumonia. 2. Blunting of the bilateral costophrenic angles, which may represent small pleural effusions. Electronically Signed   By: Agustin Cree M.D.   On: 12/05/2023 16:33    Cardiac Studies     Patient Profile     89 y.o. female with history of CAD, breast cancer, atrial fibrillation, HCM who presents with A-fib with RVR.  She was admitted from 2/14 through 12/04/2023 with AKI and diarrhea, found to have new onset A-fib with RVR during admission.  She was discharged on 2/27 and then presented back on 2/28 with A-fib with RVR.  Assessment & Plan     Atrial fibrillation: Started on diltiazem drip on admission with rates controlled.  Can transition to p.o. diltiazem 180 mg daily.  Previous discussions with her and her family about anticoagulation, not felt to be a good candidate given her comorbidities and thrombocytopenia.  Rate control strategy recommended  Metastatic breast cancer: Has metastasis to the liver and spine.  Follows with oncology, radiation oncology, palliative medicine  Pancytopenia: Hemoglobin 7.6, platelets 96.  Will monitor  For questions or updates, please contact [facility] Please consult [email or web address] for contact info under        Signed, Little Ishikawa, MD  12/06/2023, 7:49 AM

## 2023-12-07 DIAGNOSIS — I4891 Unspecified atrial fibrillation: Secondary | ICD-10-CM | POA: Diagnosis not present

## 2023-12-07 DIAGNOSIS — D696 Thrombocytopenia, unspecified: Secondary | ICD-10-CM | POA: Diagnosis not present

## 2023-12-07 DIAGNOSIS — D649 Anemia, unspecified: Secondary | ICD-10-CM | POA: Diagnosis not present

## 2023-12-07 LAB — BASIC METABOLIC PANEL
Anion gap: 11 (ref 5–15)
BUN: 43 mg/dL — ABNORMAL HIGH (ref 8–23)
CO2: 14 mmol/L — ABNORMAL LOW (ref 22–32)
Calcium: 8.3 mg/dL — ABNORMAL LOW (ref 8.9–10.3)
Chloride: 110 mmol/L (ref 98–111)
Creatinine, Ser: 1.54 mg/dL — ABNORMAL HIGH (ref 0.44–1.00)
GFR, Estimated: 32 mL/min — ABNORMAL LOW (ref >60)
Glucose, Bld: 223 mg/dL — ABNORMAL HIGH (ref 70–99)
Potassium: 4.6 mmol/L (ref 3.5–5.1)
Sodium: 135 mmol/L (ref 135–145)

## 2023-12-07 LAB — CBC
HCT: 23.9 % — ABNORMAL LOW (ref 36.0–46.0)
Hemoglobin: 7.9 g/dL — ABNORMAL LOW (ref 12.0–15.0)
MCH: 32.8 pg (ref 26.0–34.0)
MCHC: 33.1 g/dL (ref 30.0–36.0)
MCV: 99.2 fL (ref 80.0–100.0)
Platelets: 139 10*3/uL — ABNORMAL LOW (ref 150–400)
RBC: 2.41 MIL/uL — ABNORMAL LOW (ref 3.87–5.11)
RDW: 17.2 % — ABNORMAL HIGH (ref 11.5–15.5)
WBC: 6.1 10*3/uL (ref 4.0–10.5)
nRBC: 0 % (ref 0.0–0.2)

## 2023-12-07 MED ORDER — DILTIAZEM HCL ER COATED BEADS 240 MG PO CP24
240.0000 mg | ORAL_CAPSULE | Freq: Every day | ORAL | Status: DC
  Administered 2023-12-07 – 2023-12-09 (×3): 240 mg via ORAL
  Filled 2023-12-07 (×3): qty 1

## 2023-12-07 NOTE — Progress Notes (Signed)
 Mobility Specialist Progress Note;   12/07/23 0910  Mobility  Activity Stood at bedside (Chair)  Level of Assistance Moderate assist, patient does 50-74%  Assistive Device Front wheel walker  Activity Response Tolerated fair  Mobility Referral Yes  Mobility visit 1 Mobility  Mobility Specialist Start Time (ACUTE ONLY) [id number]  Mobility Specialist Stop Time (ACUTE ONLY) 0920  Mobility Specialist Time Calculation (min) (ACUTE ONLY) 10 min   Pt in chair upon arrival, agreeable to mobility. D/t increased HR, deferred ambulation however agreeable to participate in STS's from chair. Required ModA to stand from chair (5x) and perform standing marches. No c/o when asked. Pt left comfortably in chair with all needs met, daughter present.   Caesar Bookman Mobility Specialist Please contact via SecureChat or [employer] [phone]

## 2023-12-07 NOTE — Plan of Care (Signed)
  Problem: Health Behavior/Discharge Planning: Goal: Ability to manage health-related needs will improve Outcome: Progressing   Problem: Clinical Measurements: Goal: Respiratory complications will improve Outcome: Progressing Goal: Cardiovascular complication will be avoided Outcome: Progressing   Problem: Coping: Goal: Level of anxiety will decrease Outcome: Progressing   Problem: Safety: Goal: Ability to remain free from injury will improve Outcome: Progressing   Problem: Activity: Goal: Ability to tolerate increased activity will improve Outcome: Progressing   Problem: Cardiac: Goal: Ability to achieve and maintain adequate cardiopulmonary perfusion will improve Outcome: Progressing

## 2023-12-07 NOTE — Progress Notes (Signed)
 Rounding Note    Patient Name: [name] Date of Encounter: 12/07/2023  [facility] Cardiologist: Donato Schultz, MD   Subjective   BP 138/61.  Stable renal function (creatinine 1.44 > 1.50 >1.54).  Hemoglobin 7.9, platelets 139.  Denies any chest pain or dyspnea  Inpatient Medications    Scheduled Meds:  aspirin EC  81 mg Oral Daily   cyanocobalamin  1,000 mcg Oral Daily   dexamethasone  4 mg Oral BID   diltiazem  180 mg Oral Daily   feeding supplement  237 mL Oral BID BM   hydroxychloroquine  200 mg Oral Daily   isosorbide mononitrate  60 mg Oral Daily   pantoprazole  20 mg Oral Daily   rosuvastatin  10 mg Oral QODAY   sucralfate  1 g Oral TID WC & HS   Continuous Infusions:   PRN Meds: acetaminophen **OR** acetaminophen, melatonin, ondansetron **OR** ondansetron (ZOFRAN) IV, oxyCODONE, polyethylene glycol   Vital Signs    Vitals:   12/06/23 1637 12/06/23 2005 12/06/23 2012 12/07/23 0403  BP: (!) 166/76  (!) 137/54 138/61  Pulse:  98 86 69  Resp:  17 16 18   Temp: 98.1 F (36.7 C)  97.9 F (36.6 C) (!) 97.3 F (36.3 C)  TempSrc: Oral  Oral Oral  SpO2:  97% 99% 99%  Weight:      Height:        Intake/Output Summary (Last 24 hours) at 12/07/2023 0733 Last data filed at 12/06/2023 2005 Gross per 24 hour  Intake 237 ml  Output --  Net 237 ml      12/05/2023    6:22 PM 12/05/2023    3:04 PM 11/21/2023    3:25 PM  Last 3 Weights  Weight (lbs) 114 lb 14.4 oz 115 lb 117 lb 15.1 oz  Weight (kg) 52.118 kg 52.164 kg 53.5 kg      Telemetry    A-fib with rate 80-110s- Personally Reviewed  ECG    No new ECG- Personally Reviewed  Physical Exam   GEN: No acute distress.   Neck: No JVD Cardiac: Irregular, normal rate no murmurs, rubs, or gallops.  Respiratory: Clear to auscultation bilaterally. GI: Soft, nontender, non-distended  MS: Trace edema; No deformity. Neuro:  Nonfocal  Psych: Normal affect   Labs    High Sensitivity Troponin:    Recent Labs  Lab 11/24/23 0711 11/24/23 1028 12/05/23 1536 12/05/23 1721  TROPONINIHS 54* 50* 30* 35*     Chemistry Recent Labs  Lab 12/03/23 0614 12/04/23 0604 12/05/23 1536 12/06/23 0424 12/07/23 0401  NA 137 135 135 132* 135  K 2.9* 3.9 4.1 4.2 4.6  CL 114* 111 111 109 110  CO2 15* 18* 16* 14* 14*  GLUCOSE 104* 128* 166* 171* 223*  BUN 34* 33* 38* 41* 43*  CREATININE 1.15* 1.21* 1.44* 1.50* 1.54*  CALCIUM 7.5* 7.6* 7.9* 8.0* 8.3*  MG 1.4* 2.3  --   --   --   PROT  --   --   --  4.0*  --   ALBUMIN  --   --   --  2.1*  --   AST  --   --   --  14*  --   ALT  --   --   --  29  --   ALKPHOS  --   --   --  61  --   BILITOT  --   --   --  0.6  --  GFRNONAA 46* 43* 35* 34* 32*  ANIONGAP 8 6 8 9 11     Lipids No results for input(s): "CHOL", "TRIG", "HDL", "LABVLDL", "LDLCALC", "CHOLHDL" in the last 168 hours.  Hematology Recent Labs  Lab 12/05/23 1536 12/06/23 0424 12/07/23 0401  WBC 4.4 4.1 6.1  RBC 2.46* 2.27* 2.41*  HGB 8.3* 7.6* 7.9*  HCT 24.8* 22.6* 23.9*  MCV 100.8* 99.6 99.2  MCH 33.7 33.5 32.8  MCHC 33.5 33.6 33.1  RDW 17.2* 17.3* 17.2*  PLT 107* 96* 139*   Thyroid No results for input(s): "TSH", "FREET4" in the last 168 hours.  BNP Recent Labs  Lab 12/05/23 1536  BNP 998.6*    DDimer No results for input(s): "DDIMER" in the last 168 hours.   Radiology    DG Chest Port 1 View Result Date: 12/05/2023 CLINICAL DATA:  Shortness of breath EXAM: PORTABLE CHEST 1 VIEW COMPARISON:  Chest radiograph dated 04/07/2013 FINDINGS: Low lung volumes with bronchovascular crowding. Bibasilar patchy opacities and dense left retrocardiac opacity. Blunting of the bilateral costophrenic angles. No pneumothorax. The heart size and mediastinal contours are within normal limits. No acute osseous abnormality. IMPRESSION: 1. Low lung volumes with bronchovascular crowding. Bibasilar patchy opacities and dense left retrocardiac opacity, which may represent atelectasis,  aspiration, or pneumonia. 2. Blunting of the bilateral costophrenic angles, which may represent small pleural effusions. Electronically Signed   By: Agustin Cree M.D.   On: 12/05/2023 16:33    Cardiac Studies     Patient Profile     89 y.o. female with history of CAD, breast cancer, atrial fibrillation, HCM who presents with A-fib with RVR.  She was admitted from 2/14 through 12/04/2023 with AKI and diarrhea, found to have new onset A-fib with RVR during admission.  She was discharged on 2/27 and then presented back on 2/28 with A-fib with RVR.  Assessment & Plan     Atrial fibrillation: Started on diltiazem drip on admission with rates controlled.  Transitioned to p.o. diltiazem 180 mg daily.  Previous discussions with her and her family about anticoagulation, not felt to be a good candidate given her comorbidities and thrombocytopenia.  Rate control strategy recommended -Heart rate intermittently elevated, will increase diltiazem to 240 mg daily  Metastatic breast cancer: Has metastasis to the liver and spine.  Follows with oncology, radiation oncology, palliative medicine  Pancytopenia: Stable hemoglobin and platelet count  For questions or updates, please contact [facility] Please consult [email or web address] for contact info under        Signed, Little Ishikawa, MD  12/07/2023, 7:33 AM

## 2023-12-07 NOTE — Progress Notes (Signed)
 [facility]                                                                              [name], is a 89 y.o. female, DOB - 1933/10/24, ZOX:[id number] Admit date - 12/05/2023    Outpatient Primary MD for the patient is Lorenda Ishihara, MD  LOS - 2  days  Chief Complaint  Patient presents with   Shortness of Breath       Brief summary   Patient is a 89 year old female with CAD, left breast Ca ER positive, atrial fibrillation, hypertrophic cardiomyopathy, dysphagia who was just discharged yesterday 12/04/2023 from [facility] (admitted 2/14 -12/04/2023 with acute kidney injury, diarrhea, found to have new onset atrial fibrillation with RVR).  Patient reported that she was in her normal baseline health until 2 PM on the day of admission when she started having sudden palpitations, chest tightness, radiating to the left arm, shortness of breath while she was eating her soup.  Denied any dizziness, lightheadedness or syncopal episode.  EMS was called as patient was having progressively worsening shortness of breath and chest pain, received 2 sublingual nitroglycerin at the facility with no relief.  Patient was brought to the ER and was found to be in rapid atrial fibrillation with RVR with heart rate in 150s.  She was placed on IV Cardizem drip.   Assessment & Plan    Principal Problem: Paroxysmal atrial fibrillation with RVR (HCC) -Patient was placed on IV Cardizem drip, heart now controlled -Cardiology consulted, transition to p.o. Cardizem -Heart rate intermittently elevated, diltiazem increased to 240 mg daily -Per cardiology, not felt to be a good candidate for anticoagulation given her comorbidities and thrombocytopenia -2D echo on 2/18 had shown EF of 65 to 70%, no regional WMA -Continue aspirin   Active Problems: Acute kidney injury on CKD stage IIIa -Baseline creatinine 1.2 -Avoid hypotension, nephrotoxic medications, previous admission  had discontinued chlorthalidone -Creatinine 1.54   Hypertension -Stable on oral Cardizem   Coronary artery disease, hyperlipidemia History of hypertrophic cardiomyopathy, history of chronic stable angina - chest tightness on admission with palpitations and shortness of breath due to #1 - recent echo 2/18 had shown EF of 65 to 70%, no regional WMA -During previous admission, cardiology had recommended against cardiac MRI or invasive testing -Aspirin, Imdur   Carcinoma of upper-outer quadrant of left breast in female, estrogen receptor positive (HCC), 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 -Verzenio currently on hold -Outpatient follow-up with oncology, radiation oncology, palliative medicine -Continue pain control   Pancytopenia with anemia, thrombocytopenia macrocytosis -Improving, during previous admission, this was discussed with hematology and was thought likely due to [facility] and probably bone marrow infiltration, recommended supportive care  -Hemoglobin 7.9, continue to follow, transfuse for hemoglobin less than 7.5  Pressure injury documentation Left buttocks stage II,  Vertebral column medial stage I, POA -Wound care per nursing  Estimated body mass index is 18.55 kg/m as calculated from the following:   Height as of this encounter: 5\' 6"  (1.676 m).   Weight as of this encounter: 52.1 kg.  Code Status: DNR DVT Prophylaxis:  SCDs Start: 12/05/23 2220  Level of Care: Level of care: Progressive Family Communication: Updated patient Disposition Plan:      Remains inpatient appropriate:      Procedures:    Consultants:   Cardiology  Antimicrobials:   Anti-infectives (From admission, onward)    Start     Dose/Rate Route Frequency Ordered Stop   12/06/23 1430  hydroxychloroquine (PLAQUENIL) tablet 200 mg        200 mg Oral Daily 12/06/23 1341            Medications  aspirin EC  81 mg Oral Daily   cyanocobalamin  1,000 mcg Oral Daily   dexamethasone  4 mg Oral  BID   diltiazem  240 mg Oral Daily   feeding supplement  237 mL Oral BID BM   hydroxychloroquine  200 mg Oral Daily   isosorbide mononitrate  60 mg Oral Daily   pantoprazole  20 mg Oral Daily   rosuvastatin  10 mg Oral QODAY   sucralfate  1 g Oral TID WC & HS      Subjective:   [name] was seen and examined today.  Heart rate still is intermittently elevated, no chest pain, acute shortness of breath, fevers or chills, overall improving.    Objective:   Vitals:   12/06/23 2005 12/06/23 2012 12/07/23 0403 12/07/23 1148  BP:  (!) 137/54 138/61 (!) 177/93  Pulse: 98 86 69 96  Resp: 17 16 18 18   Temp:  97.9 F (36.6 C) (!) 97.3 F (36.3 C) 99.1 F (37.3 C)  TempSrc:  Oral Oral Oral  SpO2: 97% 99% 99%   Weight:      Height:        Intake/Output Summary (Last 24 hours) at 12/07/2023 1212 Last data filed at 12/06/2023 2005 Gross per 24 hour  Intake 237 ml  Output --  Net 237 ml     Wt Readings from Last 3 Encounters:  12/05/23 52.1 kg  11/21/23 53.5 kg  11/21/23 51.3 kg    Physical Exam General: Alert and oriented x 3, NAD Cardiovascular: Irregular Respiratory: CTAB, no wheezing Gastrointestinal: Soft, nontender, nondistended, NBS Ext: trace pedal edema bilaterally Neuro: no new deficits Psych: Normal affect    Data Reviewed:  I have personally reviewed following labs    CBC Lab Results  Component Value Date   WBC 6.1 12/07/2023   RBC 2.41 (L) 12/07/2023   HGB 7.9 (L) 12/07/2023   HCT 23.9 (L) 12/07/2023   MCV 99.2 12/07/2023   MCH 32.8 12/07/2023   PLT 139 (L) 12/07/2023   MCHC 33.1 12/07/2023   RDW 17.2 (H) 12/07/2023   LYMPHSABS 0.2 (L) 12/05/2023   MONOABS 0.1 12/05/2023   EOSABS 0.0 12/05/2023   BASOSABS 0.0 12/05/2023     Last metabolic panel Lab Results  Component Value Date   NA 135 12/07/2023   K 4.6 12/07/2023   CL 110 12/07/2023   CO2 14 (L) 12/07/2023   BUN 43 (H) 12/07/2023   CREATININE 1.54 (H) 12/07/2023   GLUCOSE 223  (H) 12/07/2023   GFRNONAA 32 (L) 12/07/2023   GFRAA 44 (L) 06/07/2019   CALCIUM 8.3 (L) 12/07/2023   PHOS 3.9 03/03/2008   PROT 4.0 (L) 12/06/2023   ALBUMIN 2.1 (L) 12/06/2023   BILITOT 0.6 12/06/2023   ALKPHOS 61 12/06/2023   AST 14 (L) 12/06/2023   ALT 29 12/06/2023   ANIONGAP 11 12/07/2023    CBG (last 3)  Recent Labs    12/05/23 1556  GLUCAP  152*      Coagulation Profile: No results for input(s): "INR", "PROTIME" in the last 168 hours.   Radiology Studies: I have personally reviewed the imaging studies  DG Chest Port 1 View Result Date: 12/05/2023 CLINICAL DATA:  Shortness of breath EXAM: PORTABLE CHEST 1 VIEW COMPARISON:  Chest radiograph dated 04/07/2013 FINDINGS: Low lung volumes with bronchovascular crowding. Bibasilar patchy opacities and dense left retrocardiac opacity. Blunting of the bilateral costophrenic angles. No pneumothorax. The heart size and mediastinal contours are within normal limits. No acute osseous abnormality. IMPRESSION: 1. Low lung volumes with bronchovascular crowding. Bibasilar patchy opacities and dense left retrocardiac opacity, which may represent atelectasis, aspiration, or pneumonia. 2. Blunting of the bilateral costophrenic angles, which may represent small pleural effusions. Electronically Signed   By: Agustin Cree M.D.   On: 12/05/2023 16:33       Montey Ebel M.D. [facility] 12/07/2023, 12:12 PM  Available via [employer] secure chat 7am-7pm After 7 pm, please refer to night coverage provider listed on amion.

## 2023-12-08 ENCOUNTER — Encounter: Payer: Self-pay | Admitting: Pathology

## 2023-12-08 ENCOUNTER — Inpatient Hospital Stay: Payer: Medicare Other | Admitting: Oncology

## 2023-12-08 ENCOUNTER — Inpatient Hospital Stay: Payer: Medicare Other

## 2023-12-08 DIAGNOSIS — I2510 Atherosclerotic heart disease of native coronary artery without angina pectoris: Secondary | ICD-10-CM

## 2023-12-08 DIAGNOSIS — K219 Gastro-esophageal reflux disease without esophagitis: Secondary | ICD-10-CM

## 2023-12-08 DIAGNOSIS — D696 Thrombocytopenia, unspecified: Secondary | ICD-10-CM | POA: Diagnosis not present

## 2023-12-08 DIAGNOSIS — Z8543 Personal history of malignant neoplasm of ovary: Secondary | ICD-10-CM

## 2023-12-08 DIAGNOSIS — I4891 Unspecified atrial fibrillation: Secondary | ICD-10-CM | POA: Diagnosis not present

## 2023-12-08 DIAGNOSIS — E782 Mixed hyperlipidemia: Secondary | ICD-10-CM

## 2023-12-08 DIAGNOSIS — D649 Anemia, unspecified: Secondary | ICD-10-CM | POA: Diagnosis not present

## 2023-12-08 LAB — CBC
HCT: 24.6 % — ABNORMAL LOW (ref 36.0–46.0)
Hemoglobin: 8.5 g/dL — ABNORMAL LOW (ref 12.0–15.0)
MCH: 34 pg (ref 26.0–34.0)
MCHC: 34.6 g/dL (ref 30.0–36.0)
MCV: 98.4 fL (ref 80.0–100.0)
Platelets: 146 10*3/uL — ABNORMAL LOW (ref 150–400)
RBC: 2.5 MIL/uL — ABNORMAL LOW (ref 3.87–5.11)
RDW: 17.1 % — ABNORMAL HIGH (ref 11.5–15.5)
WBC: 8.4 10*3/uL (ref 4.0–10.5)
nRBC: 0.2 % (ref 0.0–0.2)

## 2023-12-08 LAB — RENAL FUNCTION PANEL
Albumin: 2.2 g/dL — ABNORMAL LOW (ref 3.5–5.0)
Anion gap: 7 (ref 5–15)
BUN: 41 mg/dL — ABNORMAL HIGH (ref 8–23)
CO2: 15 mmol/L — ABNORMAL LOW (ref 22–32)
Calcium: 8 mg/dL — ABNORMAL LOW (ref 8.9–10.3)
Chloride: 111 mmol/L (ref 98–111)
Creatinine, Ser: 1.41 mg/dL — ABNORMAL HIGH (ref 0.44–1.00)
GFR, Estimated: 36 mL/min — ABNORMAL LOW (ref >60)
Glucose, Bld: 179 mg/dL — ABNORMAL HIGH (ref 70–99)
Phosphorus: 2.4 mg/dL — ABNORMAL LOW (ref 2.5–4.6)
Potassium: 4.2 mmol/L (ref 3.5–5.1)
Sodium: 133 mmol/L — ABNORMAL LOW (ref 135–145)

## 2023-12-08 MED ORDER — LOPERAMIDE HCL 2 MG PO CAPS
2.0000 mg | ORAL_CAPSULE | ORAL | Status: DC | PRN
  Administered 2023-12-08 – 2023-12-09 (×2): 2 mg via ORAL
  Filled 2023-12-08 (×2): qty 1

## 2023-12-08 MED ORDER — ENSURE ENLIVE PO LIQD
237.0000 mL | Freq: Two times a day (BID) | ORAL | Status: DC
Start: 2023-12-08 — End: 2023-12-24

## 2023-12-08 MED ORDER — SODIUM BICARBONATE 650 MG PO TABS
650.0000 mg | ORAL_TABLET | Freq: Two times a day (BID) | ORAL | Status: DC
  Administered 2023-12-08 – 2023-12-09 (×3): 650 mg via ORAL
  Filled 2023-12-08 (×3): qty 1

## 2023-12-08 MED ORDER — DILTIAZEM HCL ER COATED BEADS 240 MG PO CP24: 240.0000 mg | ORAL_CAPSULE | Freq: Every day | ORAL | Status: DC

## 2023-12-08 MED ORDER — SODIUM BICARBONATE 650 MG PO TABS: 650.0000 mg | ORAL_TABLET | Freq: Two times a day (BID) | ORAL | Status: DC

## 2023-12-08 MED ORDER — METOPROLOL TARTRATE 25 MG PO TABS
25.0000 mg | ORAL_TABLET | Freq: Two times a day (BID) | ORAL | Status: DC
  Administered 2023-12-08 – 2023-12-09 (×3): 25 mg via ORAL
  Filled 2023-12-08 (×3): qty 1

## 2023-12-08 MED ORDER — TRAZODONE HCL 50 MG PO TABS
25.0000 mg | ORAL_TABLET | Freq: Every evening | ORAL | Status: DC | PRN
  Administered 2023-12-08 (×2): 25 mg via ORAL
  Filled 2023-12-08 (×2): qty 1

## 2023-12-08 NOTE — Progress Notes (Signed)
 PROGRESS NOTE  [name] UUV:[id number] DOB: [birth year]   PCP: Lorenda Ishihara, MD  Patient is from: SNF  DOA: 12/05/2023 LOS: 3  Chief complaints Chief Complaint  Patient presents with   Shortness of Breath     Brief Narrative / Interim history: 89 year old F with PMH of CAD, paroxysmal A-fib not on anticoagulation, ER positive left breast cancer, HTN, dysphagia and recent hospitalization at [facility] from 2/14-2/27 for AKI, diarrhea and A-fib returning from SNF with sudden onset palpitations, chest tightness, radiating to the left arm, and shortness of breath while she was eating her soup.  Denied any dizziness, lightheadedness or syncopal episode.  EMS was called as patient was having progressively worsening shortness of breath and chest pain, received 2 sublingual nitroglycerin at the facility with no relief.  Patient was brought to the ER and was found to be in A-fib with RVR with heart rate to 150s.  Basic labs with mild AKI and non-anion gap metabolic acidosis.  BNP 998.  Troponin 30>> 35.  EKG showed A-fib with HR to 140.  CXR with lung volumes with bronchovascular crowding, bibasilar patchy opacities and dense left retrocardiac opacity which may represent atelectasis, aspiration or pneumonia, and bilateral costophrenic angle blunting suggesting small pleural effusions.  She was placed on IV Cardizem drip.  Cardiology consulted.   Eventually, RVR resolved.  She was transitioned to p.o. Cardizem CD 240 mg daily but continued to have significant RVR with minimal exertion.  Cardiology following.  Subjective: Seen and examined earlier this morning.  No major events overnight.  Heart no complaints.  Heart rate went into 170s after minimal exertion.  Objective: Vitals:   12/07/23 2050 12/08/23 0405 12/08/23 0915 12/08/23 1334  BP: (!) 176/98 137/68 (!) 146/62   Pulse: 95 85  (!) 112  Resp: 18 18    Temp: (!) 97.5 F (36.4 C) 97.6 F (36.4 C)    TempSrc:  Oral Oral    SpO2: 99% 98%    Weight:      Height:        Examination:  GENERAL: No apparent distress.  Nontoxic. HEENT: MMM.  Vision and hearing grossly intact.  NECK: Supple.  No apparent JVD.  RESP:  No IWOB.  Fair aeration bilaterally. CVS: Irregular rhythm.  HR in 80s to 90s at rest.. Heart sounds normal.  ABD/GI/GU: BS+. Abd soft, NTND.  MSK/EXT:  Moves extremities. No apparent deformity. No edema.  Kyphosis. SKIN: no apparent skin lesion or wound NEURO: Awake, alert and oriented appropriately.  No apparent focal neuro deficit. PSYCH: Calm. Normal affect.   Procedures:  None  Microbiology summarized: MRSA PCR screen nonreactive.  Assessment and plan: Paroxysmal atrial fibrillation with RVR: RVR resolved.  HR stayed stable in 80s to 90s on p.o. Cardizem CD 240 mg daily.  Recent TTE on 2/18 with LVEF of 65 to 70% and no significant valvular lesion.  TSH within normal.  BNP was elevated to 1000.  CXR as above.  She appears euvolemic on exam. -Cardiology on board-continue p.o. Cardizem 240 mg daily, start metoprolol 25 mg twice daily -Per cardiology, not felt to be a good candidate for anticoagulation given her comorbidities and thrombocytopenia -Continue low-dose aspirin.   AKI CKD-3B: Baseline Cr 1.1-1.2.  Improved. Recent Labs (within last 365 days)              Recent Labs    11/29/23 4742 11/30/23 5956 12/01/23 0505 12/02/23 0501 12/03/23 3875 12/04/23 0604 12/05/23 1536 12/06/23 0424  12/07/23 0401 12/08/23 0508  BUN 46* 35* 38* 35* 34* 33* 38* 41* 43* 41*  CREATININE 1.46* 1.22* 1.36* 1.27* 1.15* 1.21* 1.44* 1.50* 1.54* 1.41*    -Avoid nephrotoxic meds -Continue monitoring     Essential hypertension: BP slightly elevated but normotensive for most part. -P.o. Cardizem and metoprolol as above -Continue home Imdur -Discontinue home Coreg   Elevated troponin: Likely demand ischemia in the setting of #1. Coronary artery disease, hyperlipidemia: Recent TTE  without regional wall motion abnormality. -Continue home meds including Imdur and aspirin. -Metoprolol added for better rate control     History of hypertrophic cardiomyopathy, history of chronic stable angina: Chest tightness on admission with palpitations and shortness of breath due to #1: Recent TTE on 2/18 with LVEF of 65 to 70%, no RWMA but asymmetric LVH.  Appears euvolemic.  Not on diuretics. -P.o. 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 -Continue Decadron -Continue pain control-Tylenol and oxycodone   Pancytopenia: Thrombocytopenia and anemia improved.  Leukopenia resolved.  -Recheck CBC in 1 week -Output follow-up with hematology and oncology   Hyponatremia: Mild.  Likely due to renal failure -Monitor.   Non-anion gap metabolic acidosis -Start sodium bicarbonate. -Monitor.   Increased nutrient needs: Body mass index is 18.55 kg/m. -Continue Ensure Enlive  Pressure skin injury: Present on admission. Pressure Injury 12/05/23 Buttocks Left Stage 2 -  Partial thickness loss of dermis presenting as a shallow open injury with a red, pink wound bed without slough. 1cm x 1cm round opening on left bottoc with blachable redness surrounding the wound. (Active)  12/05/23 1843  Location: Buttocks  Location Orientation: Left  Staging: Stage 2 -  Partial thickness loss of dermis presenting as a shallow open injury with a red, pink wound bed without slough.  Wound Description (Comments): 1cm x 1cm round opening on left bottoc with blachable redness surrounding the wound.  Present on Admission:   Dressing Type Foam - Lift dressing to assess site every shift 12/08/23 0710     Pressure Injury 12/05/23 Vertebral column Medial Stage 1 -  Intact skin with non-blanchable redness of a  localized area usually over a bony prominence. blanchable erythema at the throacic spine (Active)  12/05/23 1847  Location: Vertebral column  Location Orientation: Medial  Staging: Stage 1 -  Intact skin with non-blanchable redness of a localized area usually over a bony prominence.  Wound Description (Comments): blanchable erythema at the throacic spine  Present on Admission: Yes  Dressing Type Foam - Lift dressing to assess site every shift 12/08/23 0710   DVT prophylaxis:  SCDs Start: 12/05/23 2220  Code Status: DNR/DNI Family Communication: None at bedside. Level of care: Progressive Status is: Inpatient Remains inpatient appropriate because: A-fib with RVR.   Final disposition: SNF Consultants:  Cardiology  55 minutes with more than 50% spent in reviewing records, counseling patient/family and coordinating care.   Sch Meds:  Scheduled Meds:  aspirin EC  81 mg Oral Daily   cyanocobalamin  1,000 mcg Oral Daily   dexamethasone  4 mg Oral BID   diltiazem  240 mg Oral Daily   feeding supplement  237 mL Oral BID BM   hydroxychloroquine  200 mg Oral Daily   isosorbide mononitrate  60 mg Oral Daily   metoprolol tartrate  25 mg Oral BID   pantoprazole  20 mg Oral Daily   rosuvastatin  10 mg Oral QODAY   sodium bicarbonate  650 mg Oral BID   sucralfate  1 g Oral TID WC & HS   Continuous Infusions: PRN Meds:.acetaminophen **OR** acetaminophen, melatonin, ondansetron **OR** ondansetron (ZOFRAN) IV, oxyCODONE, polyethylene glycol, traZODone  Antimicrobials: Anti-infectives (From admission, onward)    Start     Dose/Rate Route Frequency Ordered Stop   12/06/23 1430  hydroxychloroquine (PLAQUENIL) tablet 200 mg        200 mg Oral Daily 12/06/23 1341          I have personally reviewed the following labs and images: CBC: Recent Labs  Lab 12/04/23 0604 12/05/23 1536 12/06/23 0424 12/07/23 0401 12/08/23 0508  WBC 3.2* 4.4 4.1 6.1 8.4  NEUTROABS 2.6 3.9  --   --    --   HGB 8.1* 8.3* 7.6* 7.9* 8.5*  HCT 24.6* 24.8* 22.6* 23.9* 24.6*  MCV 102.1* 100.8* 99.6 99.2 98.4  PLT 83* 107* 96* 139* 146*   BMP &GFR Recent Labs  Lab 12/03/23 0614 12/04/23 0604 12/05/23 1536 12/06/23 0424 12/07/23 0401 12/08/23 0508  NA 137 135 135 132* 135 133*  K 2.9* 3.9 4.1 4.2 4.6 4.2  CL 114* 111 111 109 110 111  CO2 15* 18* 16* 14* 14* 15*  GLUCOSE 104* 128* 166* 171* 223* 179*  BUN 34* 33* 38* 41* 43* 41*  CREATININE 1.15* 1.21* 1.44* 1.50* 1.54* 1.41*  CALCIUM 7.5* 7.6* 7.9* 8.0* 8.3* 8.0*  MG 1.4* 2.3  --   --   --   --   PHOS  --   --   --   --   --  2.4*   Estimated Creatinine Clearance: 23.1 mL/min (A) (by C-G formula based on SCr of 1.41 mg/dL (H)). Liver & Pancreas: Recent Labs  Lab 12/06/23 0424 12/08/23 0508  AST 14*  --   ALT 29  --   ALKPHOS 61  --   BILITOT 0.6  --   PROT 4.0*  --   ALBUMIN 2.1* 2.2*   No results for input(s): "LIPASE", "AMYLASE" in the last 168 hours. No results for input(s): "AMMONIA" in the last 168 hours. Diabetic: No results for input(s): "HGBA1C" in the last 72 hours. Recent Labs  Lab 12/05/23 1556  GLUCAP 152*   Cardiac Enzymes: No results for input(s): "CKTOTAL", "CKMB", "CKMBINDEX", "TROPONINI" in the last 168 hours. No results for input(s): "PROBNP" in the last 8760 hours. Coagulation Profile: No results for input(s): "INR", "PROTIME" in the last 168 hours. Thyroid Function Tests: No results for input(s): "TSH", "T4TOTAL", "FREET4", "T3FREE", "THYROIDAB" in the last 72 hours. Lipid Profile: No results for input(s): "CHOL", "HDL", "LDLCALC", "TRIG", "CHOLHDL", "LDLDIRECT" in the last 72 hours. Anemia Panel: No results for input(s): "VITAMINB12", "FOLATE", "FERRITIN", "TIBC", "IRON", "RETICCTPCT" in the last 72 hours. Urine analysis:    Component Value Date/Time   COLORURINE YELLOW 11/21/2023 1840   APPEARANCEUR CLEAR 11/21/2023 1840   LABSPEC 1.016 11/21/2023 1840   PHURINE 5.0 11/21/2023 1840    GLUCOSEU NEGATIVE 11/21/2023 1840   HGBUR NEGATIVE 11/21/2023 1840   BILIRUBINUR NEGATIVE 11/21/2023 1840   BILIRUBINUR n 09/15/2015 1123   KETONESUR NEGATIVE 11/21/2023 1840   PROTEINUR NEGATIVE 11/21/2023 1840   UROBILINOGEN negative 09/15/2015 1123   UROBILINOGEN 0.2 04/11/2009 1015   NITRITE NEGATIVE 11/21/2023 1840   LEUKOCYTESUR NEGATIVE 11/21/2023 1840   Sepsis Labs: Invalid input(s): "PROCALCITONIN", "LACTICIDVEN"  Microbiology: Recent Results (from the past 240 hours)  MRSA Next Gen by PCR, Nasal     Status: None   Collection Time: 12/06/23  6:10 AM   Specimen: Nasal Mucosa; Nasal Swab  Result Value Ref Range Status   MRSA by PCR Next Gen NOT DETECTED NOT DETECTED Final    Comment: (NOTE) The GeneXpert MRSA Assay (FDA approved for NASAL specimens only), is one component of a comprehensive MRSA colonization surveillance program. It is not intended to diagnose MRSA infection nor to guide or monitor treatment for MRSA infections. Test performance is not FDA approved in patients less than 65 years old. Performed at [facility], [address], [place], [place] [address]     Radiology Studies: No results found.    Bevin Das T. Deontre Allsup [facility]  If 7PM-7AM, please contact night-coverage [email or web address] 12/08/2023, 1:40 PM

## 2023-12-08 NOTE — TOC Initial Note (Addendum)
 Transition of Care [facility]) - Initial/Assessment Note    Patient Details  Name: [name] MRN: [id number] Date of Birth: 1934/02/21  Transition of Care [facility] CM/SW Contact:    Delilah Shan, LCSWA Phone Number: 12/08/2023, 10:48 AM  Clinical Narrative:                  CSW received consult for possible SNF placement at time of discharge. CSW spoke with patient regarding PT recommendation of SNF placement at time of discharge. Patient reports PTA she comes from [facility] term. Patient expressed understanding of PT recommendation and confirmed her plan is to return back to [facility] for short term rehab.CSW discussed insurance authorization process.  No further questions reported at this time. CSW to continue to follow and assist with discharge planning needs.   Update- CSW spoke with Nicki at [facility] who informed CSW facility can accept patient today if medically ready. CSW informed MD.   Expected Discharge Plan: Skilled Nursing Facility Barriers to Discharge: Continued Medical Work up   Patient Goals and CMS Choice Patient states their goals for this hospitalization and ongoing recovery are:: SNF CMS Medicare.gov Compare Post Acute Care list provided to:: Patient Choice offered to / list presented to : Patient      Expected Discharge Plan and Services In-house Referral: Clinical Social Work     Living arrangements for the past 2 months:  (from [facility] short term lives at home alone)                                      Prior Living Arrangements/Services Living arrangements for the past 2 months:  (from [facility] short term lives at home alone) Lives with:: Self Patient language and need for interpreter reviewed:: Yes Do you feel safe going back to the place where you live?: No   SNF  Need for Family Participation in Patient Care: Yes (Comment) Care giver support system in place?: Yes (comment)   Criminal Activity/Legal  Involvement Pertinent to Current Situation/Hospitalization: No - Comment as needed  Activities of Daily Living   ADL Screening (condition at time of admission) Independently performs ADLs?: No Does the patient have a NEW difficulty with bathing/dressing/toileting/self-feeding that is expected to last >3 days?: Yes (Initiates electronic notice to provider for possible OT consult) Does the patient have a NEW difficulty with getting in/out of bed, walking, or climbing stairs that is expected to last >3 days?: Yes (Initiates electronic notice to provider for possible PT consult) Does the patient have a NEW difficulty with communication that is expected to last >3 days?: No Is the patient deaf or have difficulty hearing?: No Does the patient have difficulty seeing, even when wearing glasses/contacts?: No Does the patient have difficulty concentrating, remembering, or making decisions?: No  Permission Sought/Granted Permission sought to share information with : Case Manager, Family Supports, Oceanographer granted to share information with : Yes, Verbal Permission Granted  Share Information with NAME: Eber Jones  Permission granted to share info w AGENCY: SNF  Permission granted to share info w Relationship: daughter  Permission granted to share info w Contact Information: Eber Jones [phone]  Emotional Assessment Appearance:: Appears stated age Attitude/Demeanor/Rapport: Gracious Affect (typically observed): Calm Orientation: : Oriented to Self, Oriented to Place, Oriented to  Time, Oriented to Situation Alcohol / Substance Use: Not Applicable Psych Involvement: No (comment)  Admission diagnosis:  Thrombocytopenia (HCC) [D69.6] Chronic anemia [D64.9] Atrial fibrillation with RVR (HCC) [I48.91] Patient Active Problem List   Diagnosis Date Noted   Thrombocytopenia (HCC) 12/06/2023   Atrial fibrillation with RVR (HCC) 12/05/2023   Medication management 12/03/2023    Counseling and coordination of care 12/02/2023   Odynophagia 12/02/2023   Palliative care encounter 12/02/2023   Protein-calorie malnutrition, severe 12/02/2023   Failure to thrive 11/30/2023   Dysphagia 11/30/2023   Bicytopenia 11/27/2023   Hypokalemia 11/27/2023   New onset a-fib (HCC) 11/24/2023   Hypertrophic cardiomyopathy (HCC) 11/24/2023   Demand ischemia (HCC) 11/24/2023   Chronic anemia 11/24/2023   Malignant neoplasm of ovary (HCC) 11/24/2023   AKI (acute kidney injury) (HCC) 11/21/2023   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) 11/12/2023   Infection of hand 02/02/2022   Genetic testing 07/20/2019   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/20/2019   Family history of breast cancer    Carcinoma of upper-outer quadrant of left breast in female, estrogen receptor positive (HCC) 05/19/2019   Left carotid bruit 01/21/2017   Hypertension    Hyperlipidemia    GERD (gastroesophageal reflux disease)    Ureteral calculi    History of breast cancer    History of ovarian cancer    History of skin cancer    Frequency of urination    Dysuria    Kidney stones    CAD (coronary artery disease)    Ductal carcinoma (HCC) 08/11/2013   Serous tumor, 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   PCP:  Lorenda Ishihara, MD Pharmacy:   [facility] [phone] Gin[place] Otto, [place] - [address] AT [facility] [address] [place] [address] Phone: [phone] Fax: [phone]     Social Drivers of Health (SDOH) Social History: SDOH Screenings   Food Insecurity: No Food Insecurity (12/05/2023)  Housing: Low Risk  (12/06/2023)  Transportation Needs: No Transportation Needs (12/05/2023)  Utilities: Not At Risk (12/05/2023)  Depression (PHQ2-9): Low Risk  (11/11/2023)  Social Connections: Moderately Integrated (12/06/2023)  Tobacco Use: Low Risk  (12/06/2023)   SDOH Interventions:     Readmission Risk Interventions    11/23/2023   11:56 AM   Readmission Risk Prevention Plan  Transportation Screening Complete  PCP or Specialist Appt within 5-7 Days Complete  Home Care Screening Complete  Medication Review (RN CM) Complete

## 2023-12-08 NOTE — Progress Notes (Signed)
 Physical Therapy Treatment Patient Details Name: [name] MRN: [id number] DOB: 09/04/1934 Today's Date: 12/08/2023   History of Present Illness ASALEE BARRETTE is a 89 y.o. female adm 12/05/23 after she started having sudden palpitations, chest tightness, radiating to the left arm, shortness of breath. +afib RVR PMH significant of coronary artery disease, breast cancer, left, atrial fibrillation, hypertrophic cardiomyopathy, dysphagia who was just discharged yesterday 12/04/2023 from [facility]    PT Comments  Pt making steady progress with mobility. Better HR control this PM with HR only to 114 with ambulation in hall. Continue to recommend pt return to inpatient follow up therapy, <3 hours/day.      If plan is discharge home, recommend the following: A little help with walking and/or transfers;A little help with bathing/dressing/bathroom;Assistance with cooking/housework;Help with stairs or ramp for entrance;Assist for transportation   Can travel by private vehicle     Yes  Equipment Recommendations  Rolling walker (2 wheels)    Recommendations for Other Services       Precautions / Restrictions Precautions Precautions: Fall Recall of Precautions/Restrictions: Intact Restrictions Weight Bearing Restrictions Per Provider Order: No     Mobility  Bed Mobility Overal bed mobility: Needs Assistance Bed Mobility: Supine to Sit, Sit to Supine     Supine to sit: Contact guard, HOB elevated, Used rails Sit to supine: Min assist   General bed mobility comments: Assist to get feet back into bed    Transfers Overall transfer level: Needs assistance Equipment used: Rolling walker (2 wheels) Transfers: Sit to/from Stand Sit to Stand: Min assist           General transfer comment: Assist to power up    Ambulation/Gait Ambulation/Gait assistance: Contact guard assist, Min assist Gait Distance (Feet): 115 Feet Assistive device: Rolling walker (2  wheels) Gait Pattern/deviations: Step-through pattern, Decreased stride length, Trunk flexed Gait velocity: decr Gait velocity interpretation: <1.31 ft/sec, indicative of household ambulator   General Gait Details: Assist for safety and monitoring for fatigue and vitals   Stairs             Wheelchair Mobility     Tilt Bed    Modified Rankin (Stroke Patients Only)       Balance Overall balance assessment: Needs assistance Sitting-balance support: No upper extremity supported Sitting balance-Leahy Scale: Fair     Standing balance support: Bilateral upper extremity supported, Reliant on assistive device for balance Standing balance-Leahy Scale: Poor Standing balance comment: walker and CGA for static standing                            Communication Communication Communication: No apparent difficulties  Cognition Arousal: Alert Behavior During Therapy: WFL for tasks assessed/performed   PT - Cognitive impairments: No apparent impairments                         Following commands: Intact      Cueing Cueing Techniques: Verbal cues  Exercises      General Comments General comments (skin integrity, edema, etc.): HR 88-114      Pertinent Vitals/Pain Pain Assessment Pain Assessment: Faces Faces Pain Scale: Hurts a little bit Pain Location: buttocks Pain Descriptors / Indicators: Grimacing Pain Intervention(s): Repositioned    Home Living                          Prior Function  PT Goals (current goals can now be found in the care plan section) Acute Rehab PT Goals Patient Stated Goal: get stronger; return to rehab Progress towards PT goals: Progressing toward goals    Frequency    Min 1X/week      PT Plan      Co-evaluation              AM-PAC PT "6 Clicks" Mobility   Outcome Measure  Help needed turning from your back to your side while in a flat bed without using bedrails?: A  Little Help needed moving from lying on your back to sitting on the side of a flat bed without using bedrails?: A Little Help needed moving to and from a bed to a chair (including a wheelchair)?: A Little Help needed standing up from a chair using your arms (e.g., wheelchair or bedside chair)?: A Little Help needed to walk in hospital room?: A Little Help needed climbing 3-5 steps with a railing? : Total 6 Click Score: 16    End of Session Equipment Utilized During Treatment: Gait belt Activity Tolerance: Patient limited by fatigue Patient left: in chair;with call bell/phone within reach;with family/visitor present;with bed alarm set Nurse Communication: Mobility status PT Visit Diagnosis: Difficulty in walking, not elsewhere classified (R26.2)     Time: 1610-9604 PT Time Calculation (min) (ACUTE ONLY): 24 min  Charges:    $Gait Training: 23-37 mins PT General Charges $$ ACUTE PT VISIT: 1 Visit                     [facility] PT Acute Rehabilitation Services Office [phone]    Angelina Ok [facility] 12/08/2023, 5:25 PM

## 2023-12-08 NOTE — [place] FL2 (Signed)
 [place] MEDICAID FL2 LEVEL OF CARE FORM     IDENTIFICATION  Patient Name: [name] Birthdate: 1933-12-04 Sex: female Admission Date (Current Location): 12/05/2023  [facility] and [place] Number:  Producer, television/film/video and Address:  [facility] [place], [address], [place], [place] [address]      Provider Number: [id number]  Attending Physician Name and Address:  Almon Hercules, MD  Relative Name and Phone Number:  Eber Jones ([phone]    Current Level of Care: Hospital Recommended Level of Care: Skilled Nursing Facility Prior Approval Number:    Date Approved/Denied:   PASRR Number: [id number]  Discharge Plan: SNF    Current Diagnoses: Patient Active Problem List   Diagnosis Date Noted   Thrombocytopenia (HCC) 12/06/2023   Atrial fibrillation with RVR (HCC) 12/05/2023   Medication management 12/03/2023   Counseling and coordination of care 12/02/2023   Odynophagia 12/02/2023   Palliative care encounter 12/02/2023   Protein-calorie malnutrition, severe 12/02/2023   Failure to thrive 11/30/2023   Dysphagia 11/30/2023   Bicytopenia 11/27/2023   Hypokalemia 11/27/2023   New onset a-fib (HCC) 11/24/2023   Hypertrophic cardiomyopathy (HCC) 11/24/2023   Demand ischemia (HCC) 11/24/2023   Chronic anemia 11/24/2023   Malignant neoplasm of ovary (HCC) 11/24/2023   AKI (acute kidney injury) (HCC) 11/21/2023   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) 11/12/2023   Infection of hand 02/02/2022   Genetic testing 07/20/2019   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/20/2019   Family history of breast cancer    Carcinoma of upper-outer quadrant of left breast in female, estrogen receptor positive (HCC) 05/19/2019   Left carotid bruit 01/21/2017   Hypertension    Hyperlipidemia    GERD (gastroesophageal reflux disease)    Ureteral calculi    History of breast cancer    History of ovarian cancer    History of skin  cancer    Frequency of urination    Dysuria    Kidney stones    CAD (coronary artery disease)    Ductal carcinoma (HCC) 08/11/2013   Serous tumor, 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    Orientation RESPIRATION BLADDER Height & Weight     Self, Time, Situation, Place  Normal Continent Weight: 114 lb 14.4 oz (52.1 kg) Height:  5\' 6"  (167.6 cm)  BEHAVIORAL SYMPTOMS/MOOD NEUROLOGICAL BOWEL NUTRITION STATUS      Incontinent Diet (Please see discharge summary)  AMBULATORY STATUS COMMUNICATION OF NEEDS Skin   Limited Assist Verbally Other (Comment) (Abrasion,Leg,Back,Arm,Upper,L,R,Ecchymosis,Arm,Abdomen,Leg,Bil.,Erythema,Groin,Bil.,Upper,Rash,Groin,Bil.,Wound/Incision LDAs,PI Buttocks Left stage 2,PI vertebral column,medial stage 1)                       Personal Care Assistance Level of Assistance    Bathing Assistance: Limited assistance Feeding assistance: Independent Dressing Assistance: Limited assistance     Functional Limitations Info  Sight, Hearing, Speech Sight Info: Impaired Hearing Info: Adequate Speech Info: Adequate    SPECIAL CARE FACTORS FREQUENCY  PT (By licensed PT), OT (By licensed OT)     PT Frequency: 5x min weekly OT Frequency: 5x min weekly            Contractures Contractures Info: Not present    Additional Factors Info  Code Status, Allergies Code Status Info: DNR Allergies Info: Ramipril           Current Medications (12/08/2023):  This is the current hospital active medication list Current Facility-Administered Medications  Medication Dose Route Frequency Provider  Last Rate Last Admin   acetaminophen (TYLENOL) tablet 650 mg  650 mg Oral Q6H PRN Rai, Ripudeep K, MD       Or   acetaminophen (TYLENOL) suppository 650 mg  650 mg Rectal Q6H PRN Rai, Ripudeep K, MD       aspirin EC tablet 81 mg  81 mg Oral Daily Rai, Ripudeep K, MD   81 mg at 12/08/23 1610   cyanocobalamin (VITAMIN B12) tablet 1,000 mcg  1,000 mcg Oral Daily Rai,  Ripudeep K, MD   1,000 mcg at 12/08/23 0917   dexamethasone (DECADRON) tablet 4 mg  4 mg Oral BID Noralee Stain, DO   4 mg at 12/08/23 9604   diltiazem (CARDIZEM CD) 24 hr capsule 240 mg  240 mg Oral Daily Little Ishikawa, MD   240 mg at 12/08/23 0915   feeding supplement (ENSURE ENLIVE / ENSURE PLUS) liquid 237 mL  237 mL Oral BID BM Noralee Stain, DO   237 mL at 12/08/23 5409   hydroxychloroquine (PLAQUENIL) tablet 200 mg  200 mg Oral Daily Noralee Stain, DO   200 mg at 12/08/23 8119   isosorbide mononitrate (IMDUR) 24 hr tablet 60 mg  60 mg Oral Daily Noralee Stain, DO   60 mg at 12/08/23 1478   melatonin tablet 6 mg  6 mg Oral QHS PRN Dolly Rias, MD   6 mg at 12/07/23 2126   ondansetron (ZOFRAN) tablet 4 mg  4 mg Oral Q6H PRN Rai, Ripudeep K, MD       Or   ondansetron (ZOFRAN) injection 4 mg  4 mg Intravenous Q6H PRN Rai, Ripudeep K, MD       oxyCODONE (Oxy IR/ROXICODONE) immediate release tablet 5 mg  5 mg Oral Q4H PRN Rai, Ripudeep K, MD       pantoprazole (PROTONIX) EC tablet 20 mg  20 mg Oral Daily Rai, Ripudeep K, MD   20 mg at 12/08/23 0917   polyethylene glycol (MIRALAX / GLYCOLAX) packet 17 g  17 g Oral Daily PRN Rai, Ripudeep K, MD       rosuvastatin (CRESTOR) tablet 10 mg  10 mg Oral QODAY Rai, Ripudeep K, MD   10 mg at 12/08/23 0917   sucralfate (CARAFATE) 1 GM/10ML suspension 1 g  1 g Oral TID WC & HS Rai, Ripudeep K, MD   1 g at 12/08/23 0825   traZODone (DESYREL) tablet 25 mg  25 mg Oral QHS PRN Mansy, Jan A, MD   25 mg at 12/08/23 0402     Discharge Medications: Please see discharge summary for a list of discharge medications.  Relevant Imaging Results:  Relevant Lab Results:   Additional Information [id number]  Delilah Shan, LCSWA

## 2023-12-08 NOTE — Progress Notes (Addendum)
   Patient Name: [name] Date of Encounter: 12/08/2023 [facility] Cardiologist: Donato Schultz, MD   Interval Summary  .    Patient resting comfortably in the bed, on room air  She denies any chest pain, shortness of breath, palpitations  She states she has been getting up and going to the bathroom without any issues  HR seems overall fairly controlled in 80-90s with only brief episodes of tachycardia   Vital Signs .    Vitals:   12/07/23 0403 12/07/23 1148 12/07/23 2050 12/08/23 0405  BP: 138/61 (!) 177/93 (!) 176/98 137/68  Pulse: 69 96 95 85  Resp: 18 18 18 18   Temp: (!) 97.3 F (36.3 C) 99.1 F (37.3 C) (!) 97.5 F (36.4 C) 97.6 F (36.4 C)  TempSrc: Oral Oral Oral Oral  SpO2: 99%  99% 98%  Weight:      Height:        Intake/Output Summary (Last 24 hours) at 12/08/2023 0832 Last data filed at 12/08/2023 8413 Gross per 24 hour  Intake 120 ml  Output 400 ml  Net -280 ml      12/05/2023    6:22 PM 12/05/2023    3:04 PM 11/21/2023    3:25 PM  Last 3 Weights  Weight (lbs) 114 lb 14.4 oz 115 lb 117 lb 15.1 oz  Weight (kg) 52.118 kg 52.164 kg 53.5 kg     Telemetry/ECG    Atrial fibrillation, overall rate controlled with HR 80-90s - Personally Reviewed  Physical Exam .   GEN: No acute distress, on room air  Neck: No JVD Cardiac: irregular rhythm, regular rate, no murmurs, rubs, or gallops.  Respiratory: Clear to auscultation bilaterally. GI: Soft, nontender, non-distended  MS: No edema  Assessment & Plan .     Paroxysmal atrial fibrillation with episode of RVR Hypertension  Most recent BP 137/68, HR 85 Echo from 2/18 showed EF 65-70%, no LV RWMA, severe LVH, mildly dilated left atrium, mild AR Presented to ED in atrial fibrillation with HR in 150s Previously diagnosed with AF with RVR mid-February 2025 TSH, free T4 normal CHA2DS2-VASc score of 5 She was deemed not a candidate for anticoagulation due to her co morbidities and thrombocytopenia   Continue with rate control  Patient was placed on IV diltiazem at admission and transitioned to PO  Continue diltiazem 240 mg daily -- rates elevated this afternoon, will add metoprolol 25 mg twice daily  CAD Last cath in 2007, showed nonobstructive CAD Stress test in 01/2020 showed no ischemia  Continue ASA 81 mg daily Continue Imdur 60 mg daily Continue Crestor 10 mg daily   Per primary AKI on CKD stage 3a Metastatic breast cancer  Cancer related pain Pancytopenia   For questions or updates, please contact [facility] Please consult [email or web address] for contact info under       Signed, Olena Leatherwood, PA-C   Patient seen and examined.  Agree with above documentation.  On exam, patient is alert and oriented, irregular, tachycardic, no murmurs, lungs CTAB, no LE edema.  On review of telemetry, rates were well-controlled in 80s overnight but currently up to 140s.  BP 146/62.  Will add p.o. metoprolol 25 mg twice daily, continue diltiazem 240 mg daily  Little Ishikawa, MD

## 2023-12-08 NOTE — Progress Notes (Signed)
 Mobility Specialist Progress Note;   12/08/23 1125  Mobility  Activity Ambulated with assistance in room  Level of Assistance Minimal assist, patient does 75% or more  Assistive Device Front wheel walker  Distance Ambulated (ft) 40 ft  Activity Response Tolerated fair  Mobility Referral Yes  Mobility visit 1 Mobility  Mobility Specialist Start Time (ACUTE ONLY) 1125  Mobility Specialist Stop Time (ACUTE ONLY) 1140  Mobility Specialist Time Calculation (min) (ACUTE ONLY) 15 min    Pre-mobility: HR 104bpm During-mobility: HR up to 175bpm Post-mobility: HR 111bpm  MD requesting pt to ambulate, pt agreeable to mobility. Required MinA throughout ambulation for safety. HR increased up to 175bpm with activity. No c/o when asked, just a bit winded from ambulation. Pt returned back to chair with all needs met, HR down to 111bpm. RN notified.   Caesar Bookman Mobility Specialist Please contact via SecureChat or [employer] [phone]

## 2023-12-08 NOTE — Progress Notes (Signed)
 RN paged provider for sleeping med orders. Melatonin not effective. See provider orders.

## 2023-12-08 NOTE — Consult Note (Signed)
 [facility] Liaison Consult Note   12/08/2023  [name] 04/13/1934 [id number]  [facility] Consult   Primary Care Provider:  Lorenda Ishihara, MD with Deboraha Sprang at [facility]: Medicare ACO REACH  Patient was reviewed for less than 7 days readmission with extreme high risk score barriers to care when returning to community and shows patient is being recommended to return to SNF for ongoing ST rehab.  Patient was screened for hospitalization and on behalf of [facility]  Care Coordination to assess for post hospital community care needs.  Patient is being considered for a skilled nursing facility level of care for post hospital transition.  Plan:  If transitions to affiliated facility, the [facility] RN can be notified and follow for any known needs for transitional care needs for returning to post facility care coordination needs for returning to community. Follow till disposition finalized.  For questions or referrals, please contact:  Charlesetta Shanks, RN, BSN, CCM [place]  [facility] Liaison Direct Dial: [phone] or secure chat Email: [email or web address]

## 2023-12-08 NOTE — Plan of Care (Signed)
  Problem: Education: Goal: Knowledge of General Education information will improve Description: Including pain rating scale, medication(s)/side effects and non-pharmacologic comfort measures Outcome: Progressing   Problem: Health Behavior/Discharge Planning: Goal: Ability to manage health-related needs will improve Outcome: Progressing   Problem: Clinical Measurements: Goal: Ability to maintain clinical measurements within normal limits will improve Outcome: Progressing Goal: Will remain free from infection Outcome: Progressing Goal: Diagnostic test results will improve Outcome: Progressing Goal: Respiratory complications will improve Outcome: Not Progressing Note: Pt still having doe   Problem: Activity: Goal: Risk for activity intolerance will decrease Outcome: Not Progressing   Problem: Nutrition: Goal: Adequate nutrition will be maintained Outcome: Progressing   Problem: Coping: Goal: Level of anxiety will decrease Outcome: Progressing   Problem: Elimination: Goal: Will not experience complications related to bowel motility Outcome: Not Progressing Note: Pt has been having issues with diarrhea today imodium ordered & ensure d/c'd   Problem: Pain Managment: Goal: General experience of comfort will improve and/or be controlled Outcome: Progressing   Problem: Safety: Goal: Ability to remain free from injury will improve Outcome: Progressing

## 2023-12-08 NOTE — Care Management Important Message (Signed)
 Important Message  Patient Details  Name: [name] MRN: [id number] Date of Birth: 12/12/1933   Important Message Given:  Yes - Medicare IM     Renie Ora 12/08/2023, 12:11 PM

## 2023-12-09 ENCOUNTER — Encounter: Payer: Self-pay | Admitting: *Deleted

## 2023-12-09 ENCOUNTER — Telehealth: Payer: Self-pay | Admitting: *Deleted

## 2023-12-09 DIAGNOSIS — I5032 Chronic diastolic (congestive) heart failure: Secondary | ICD-10-CM | POA: Diagnosis not present

## 2023-12-09 DIAGNOSIS — D539 Nutritional anemia, unspecified: Secondary | ICD-10-CM | POA: Diagnosis present

## 2023-12-09 DIAGNOSIS — A0472 Enterocolitis due to Clostridium difficile, not specified as recurrent: Secondary | ICD-10-CM | POA: Diagnosis present

## 2023-12-09 DIAGNOSIS — N179 Acute kidney failure, unspecified: Secondary | ICD-10-CM | POA: Diagnosis not present

## 2023-12-09 DIAGNOSIS — E878 Other disorders of electrolyte and fluid balance, not elsewhere classified: Secondary | ICD-10-CM | POA: Diagnosis not present

## 2023-12-09 DIAGNOSIS — C7981 Secondary malignant neoplasm of breast: Secondary | ICD-10-CM | POA: Diagnosis not present

## 2023-12-09 DIAGNOSIS — C787 Secondary malignant neoplasm of liver and intrahepatic bile duct: Secondary | ICD-10-CM | POA: Diagnosis present

## 2023-12-09 DIAGNOSIS — D631 Anemia in chronic kidney disease: Secondary | ICD-10-CM | POA: Diagnosis present

## 2023-12-09 DIAGNOSIS — Z8543 Personal history of malignant neoplasm of ovary: Secondary | ICD-10-CM | POA: Diagnosis not present

## 2023-12-09 DIAGNOSIS — Z79899 Other long term (current) drug therapy: Secondary | ICD-10-CM | POA: Diagnosis not present

## 2023-12-09 DIAGNOSIS — I4821 Permanent atrial fibrillation: Secondary | ICD-10-CM | POA: Diagnosis present

## 2023-12-09 DIAGNOSIS — D473 Essential (hemorrhagic) thrombocythemia: Secondary | ICD-10-CM | POA: Diagnosis present

## 2023-12-09 DIAGNOSIS — I4891 Unspecified atrial fibrillation: Secondary | ICD-10-CM | POA: Diagnosis not present

## 2023-12-09 DIAGNOSIS — C50412 Malignant neoplasm of upper-outer quadrant of left female breast: Secondary | ICD-10-CM | POA: Diagnosis not present

## 2023-12-09 DIAGNOSIS — I509 Heart failure, unspecified: Secondary | ICD-10-CM | POA: Diagnosis not present

## 2023-12-09 DIAGNOSIS — Z7982 Long term (current) use of aspirin: Secondary | ICD-10-CM | POA: Diagnosis not present

## 2023-12-09 DIAGNOSIS — E8779 Other fluid overload: Secondary | ICD-10-CM | POA: Diagnosis not present

## 2023-12-09 DIAGNOSIS — C50912 Malignant neoplasm of unspecified site of left female breast: Secondary | ICD-10-CM | POA: Diagnosis present

## 2023-12-09 DIAGNOSIS — R627 Adult failure to thrive: Secondary | ICD-10-CM | POA: Diagnosis not present

## 2023-12-09 DIAGNOSIS — J90 Pleural effusion, not elsewhere classified: Secondary | ICD-10-CM | POA: Diagnosis not present

## 2023-12-09 DIAGNOSIS — E43 Unspecified severe protein-calorie malnutrition: Secondary | ICD-10-CM | POA: Diagnosis not present

## 2023-12-09 DIAGNOSIS — I480 Paroxysmal atrial fibrillation: Secondary | ICD-10-CM | POA: Diagnosis not present

## 2023-12-09 DIAGNOSIS — Z66 Do not resuscitate: Secondary | ICD-10-CM | POA: Diagnosis present

## 2023-12-09 DIAGNOSIS — I499 Cardiac arrhythmia, unspecified: Secondary | ICD-10-CM | POA: Diagnosis not present

## 2023-12-09 DIAGNOSIS — I422 Other hypertrophic cardiomyopathy: Secondary | ICD-10-CM | POA: Diagnosis present

## 2023-12-09 DIAGNOSIS — R1313 Dysphagia, pharyngeal phase: Secondary | ICD-10-CM | POA: Diagnosis not present

## 2023-12-09 DIAGNOSIS — C7801 Secondary malignant neoplasm of right lung: Secondary | ICD-10-CM | POA: Diagnosis present

## 2023-12-09 DIAGNOSIS — D649 Anemia, unspecified: Secondary | ICD-10-CM | POA: Diagnosis not present

## 2023-12-09 DIAGNOSIS — R0602 Shortness of breath: Secondary | ICD-10-CM | POA: Diagnosis not present

## 2023-12-09 DIAGNOSIS — N1832 Chronic kidney disease, stage 3b: Secondary | ICD-10-CM | POA: Diagnosis present

## 2023-12-09 DIAGNOSIS — I4819 Other persistent atrial fibrillation: Secondary | ICD-10-CM | POA: Diagnosis not present

## 2023-12-09 DIAGNOSIS — I5043 Acute on chronic combined systolic (congestive) and diastolic (congestive) heart failure: Secondary | ICD-10-CM | POA: Diagnosis not present

## 2023-12-09 DIAGNOSIS — Z5111 Encounter for antineoplastic chemotherapy: Secondary | ICD-10-CM | POA: Diagnosis not present

## 2023-12-09 DIAGNOSIS — I130 Hypertensive heart and chronic kidney disease with heart failure and stage 1 through stage 4 chronic kidney disease, or unspecified chronic kidney disease: Secondary | ICD-10-CM | POA: Diagnosis present

## 2023-12-09 DIAGNOSIS — Z743 Need for continuous supervision: Secondary | ICD-10-CM | POA: Diagnosis not present

## 2023-12-09 DIAGNOSIS — J810 Acute pulmonary edema: Secondary | ICD-10-CM | POA: Diagnosis not present

## 2023-12-09 DIAGNOSIS — Z85828 Personal history of other malignant neoplasm of skin: Secondary | ICD-10-CM | POA: Diagnosis not present

## 2023-12-09 DIAGNOSIS — M6281 Muscle weakness (generalized): Secondary | ICD-10-CM | POA: Diagnosis not present

## 2023-12-09 DIAGNOSIS — I129 Hypertensive chronic kidney disease with stage 1 through stage 4 chronic kidney disease, or unspecified chronic kidney disease: Secondary | ICD-10-CM | POA: Diagnosis not present

## 2023-12-09 DIAGNOSIS — C7951 Secondary malignant neoplasm of bone: Secondary | ICD-10-CM | POA: Diagnosis present

## 2023-12-09 DIAGNOSIS — C50919 Malignant neoplasm of unspecified site of unspecified female breast: Secondary | ICD-10-CM | POA: Diagnosis not present

## 2023-12-09 DIAGNOSIS — J9811 Atelectasis: Secondary | ICD-10-CM | POA: Diagnosis not present

## 2023-12-09 DIAGNOSIS — E876 Hypokalemia: Secondary | ICD-10-CM | POA: Diagnosis present

## 2023-12-09 DIAGNOSIS — R1312 Dysphagia, oropharyngeal phase: Secondary | ICD-10-CM | POA: Diagnosis not present

## 2023-12-09 DIAGNOSIS — C7802 Secondary malignant neoplasm of left lung: Secondary | ICD-10-CM | POA: Diagnosis present

## 2023-12-09 DIAGNOSIS — R079 Chest pain, unspecified: Secondary | ICD-10-CM | POA: Diagnosis not present

## 2023-12-09 DIAGNOSIS — R000 Tachycardia, unspecified: Secondary | ICD-10-CM | POA: Diagnosis not present

## 2023-12-09 DIAGNOSIS — I10 Essential (primary) hypertension: Secondary | ICD-10-CM | POA: Diagnosis not present

## 2023-12-09 DIAGNOSIS — C786 Secondary malignant neoplasm of retroperitoneum and peritoneum: Secondary | ICD-10-CM | POA: Diagnosis present

## 2023-12-09 DIAGNOSIS — I5033 Acute on chronic diastolic (congestive) heart failure: Secondary | ICD-10-CM | POA: Diagnosis present

## 2023-12-09 DIAGNOSIS — I2510 Atherosclerotic heart disease of native coronary artery without angina pectoris: Secondary | ICD-10-CM | POA: Diagnosis not present

## 2023-12-09 DIAGNOSIS — L89152 Pressure ulcer of sacral region, stage 2: Secondary | ICD-10-CM | POA: Diagnosis present

## 2023-12-09 DIAGNOSIS — I2489 Other forms of acute ischemic heart disease: Secondary | ICD-10-CM | POA: Diagnosis not present

## 2023-12-09 DIAGNOSIS — L89320 Pressure ulcer of left buttock, unstageable: Secondary | ICD-10-CM | POA: Diagnosis not present

## 2023-12-09 DIAGNOSIS — D61818 Other pancytopenia: Secondary | ICD-10-CM | POA: Diagnosis not present

## 2023-12-09 DIAGNOSIS — E785 Hyperlipidemia, unspecified: Secondary | ICD-10-CM | POA: Diagnosis present

## 2023-12-09 DIAGNOSIS — R531 Weakness: Secondary | ICD-10-CM | POA: Diagnosis not present

## 2023-12-09 DIAGNOSIS — D696 Thrombocytopenia, unspecified: Secondary | ICD-10-CM | POA: Diagnosis not present

## 2023-12-09 DIAGNOSIS — K219 Gastro-esophageal reflux disease without esophagitis: Secondary | ICD-10-CM | POA: Diagnosis not present

## 2023-12-09 DIAGNOSIS — G4700 Insomnia, unspecified: Secondary | ICD-10-CM | POA: Diagnosis not present

## 2023-12-09 DIAGNOSIS — Z923 Personal history of irradiation: Secondary | ICD-10-CM | POA: Diagnosis not present

## 2023-12-09 DIAGNOSIS — R2689 Other abnormalities of gait and mobility: Secondary | ICD-10-CM | POA: Diagnosis not present

## 2023-12-09 DIAGNOSIS — R2681 Unsteadiness on feet: Secondary | ICD-10-CM | POA: Diagnosis not present

## 2023-12-09 DIAGNOSIS — Z170 Estrogen receptor positive status [ER+]: Secondary | ICD-10-CM | POA: Diagnosis not present

## 2023-12-09 LAB — RENAL FUNCTION PANEL
Albumin: 2.3 g/dL — ABNORMAL LOW (ref 3.5–5.0)
Anion gap: 4 — ABNORMAL LOW (ref 5–15)
BUN: 42 mg/dL — ABNORMAL HIGH (ref 8–23)
CO2: 18 mmol/L — ABNORMAL LOW (ref 22–32)
Calcium: 8.1 mg/dL — ABNORMAL LOW (ref 8.9–10.3)
Chloride: 114 mmol/L — ABNORMAL HIGH (ref 98–111)
Creatinine, Ser: 1.32 mg/dL — ABNORMAL HIGH (ref 0.44–1.00)
GFR, Estimated: 39 mL/min — ABNORMAL LOW (ref >60)
Glucose, Bld: 142 mg/dL — ABNORMAL HIGH (ref 70–99)
Phosphorus: 2.6 mg/dL (ref 2.5–4.6)
Potassium: 4.3 mmol/L (ref 3.5–5.1)
Sodium: 136 mmol/L (ref 135–145)

## 2023-12-09 LAB — CBC
HCT: 24.8 % — ABNORMAL LOW (ref 36.0–46.0)
Hemoglobin: 8.4 g/dL — ABNORMAL LOW (ref 12.0–15.0)
MCH: 33.9 pg (ref 26.0–34.0)
MCHC: 33.9 g/dL (ref 30.0–36.0)
MCV: 100 fL (ref 80.0–100.0)
Platelets: 165 10*3/uL (ref 150–400)
RBC: 2.48 MIL/uL — ABNORMAL LOW (ref 3.87–5.11)
RDW: 17.1 % — ABNORMAL HIGH (ref 11.5–15.5)
WBC: 10.7 10*3/uL — ABNORMAL HIGH (ref 4.0–10.5)
nRBC: 0 % (ref 0.0–0.2)

## 2023-12-09 LAB — MAGNESIUM: Magnesium: 1.7 mg/dL (ref 1.7–2.4)

## 2023-12-09 MED ORDER — TRAZODONE HCL 50 MG PO TABS: 25.0000 mg | ORAL_TABLET | Freq: Every evening | ORAL | 0 refills | Status: DC | PRN

## 2023-12-09 MED ORDER — DEXAMETHASONE 2 MG PO TABS
2.0000 mg | ORAL_TABLET | Freq: Every day | ORAL | Status: DC
Start: 2023-12-10 — End: 2023-12-09

## 2023-12-09 MED ORDER — DEXAMETHASONE 2 MG PO TABS: 2.0000 mg | ORAL_TABLET | Freq: Every day | ORAL | Status: AC

## 2023-12-09 MED ORDER — METOPROLOL TARTRATE 25 MG PO TABS: 25.0000 mg | ORAL_TABLET | Freq: Two times a day (BID) | ORAL | Status: DC

## 2023-12-09 MED ORDER — MELATONIN 3 MG PO TABS: 6.0000 mg | ORAL_TABLET | Freq: Every evening | ORAL | Status: DC | PRN

## 2023-12-09 MED ORDER — OXYCODONE HCL 5 MG PO TABS: 5.0000 mg | ORAL_TABLET | ORAL | 0 refills | Status: AC | PRN

## 2023-12-09 NOTE — Discharge Summary (Signed)
 Physician Discharge Summary   Patient: [name] MRN: [id number] DOB: 1934/07/09  Admit date:     12/05/2023  Discharge date: 12/09/23  Discharge Physician: Thad Ranger, MD    PCP: Lorenda Ishihara, MD   Recommendations at discharge:   Taper Decadron to 2 mg daily for next 3 days then off.  Outpatient follow-up with Dr. 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  Denied any dizziness, lightheadedness or syncopal episode.  EMS was called as patient was having progressively worsening shortness of breath and chest pain, received 2 sublingual nitroglycerin at the facility with no relief.  Patient was brought to the ER and was found to be in A-fib with RVR with heart rate to 150s.  Basic labs with mild AKI and non-anion gap metabolic acidosis.  BNP 998.  Troponin 30>> 35.  EKG showed A-fib with HR to 140.  CXR with lung volumes with bronchovascular crowding, bibasilar patchy opacities and dense left retrocardiac opacity which may represent atelectasis, aspiration or pneumonia, and bilateral  costophrenic angle blunting suggesting small pleural effusions.  She was placed on IV Cardizem drip.  Cardiology was consulted.   Eventually, RVR resolved.  She was transitioned to p.o. Cardizem CD 240 mg daily but continued to have significant RVR with minimal exertion.     Assessment and Plan:   Paroxysmal atrial fibrillation with RVR:  - RVR resolved.  Initially started on Cardizem drip.  -Cardiology was consulted, she was transitioned to p.o. Cardizem CD 240 milligram daily but continued to have significant RVR with minimal exertion hence added metoprolol 25 mg twice daily - Recent TTE on 2/18 with LVEF of 65 to 70% and no significant valvular lesion.   -TSH within normal.  BNP was elevated to 1000.  CXR as above.  She appears euvolemic on exam. -Per cardiology, not felt to be a good candidate for anticoagulation given her comorbidities and thrombocytopenia -Continue low-dose aspirin.   AKI SUPERIMPOSED ON CKD-3B:  -Baseline creatinine 1.1-1.2 -Creatinine plateaued to 1.54 on 03/2, improving, creatinine 1.3 at discharge    Essential hypertension:  -BP stable, continue Cardizem, metoprolol    Elevated troponin: Likely demand ischemia in the setting of #1. Coronary artery disease, hyperlipidemia: Recent TTE without regional wall motion abnormality. -Continue home meds including Imdur and aspirin. -Metoprolol added for better rate control     History of hypertrophic cardiomyopathy, history of chronic stable angina:  -Chest tightness on admission with palpitations and shortness of breath due to #1  -Recent TTE on 2/18 with LVEF of 65 to 70%, no RWMA but asymmetric LVH.  Appears euvolemic.     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 -Continue pain control-Tylenol and oxycodone -Seen by Dr. Myrle Sheng, recommended to taper off the Decadron, will  arrange outpatient follow-up appointment   Pancytopenia: Thrombocytopenia and anemia improved.  Leukopenia resolved.  -Resolved, platelets 165k at discharge   Hyponatremia: Mild.  Likely due to renal failure -Resolved   Non-anion gap metabolic acidosis -Start sodium bicarbonate. -Monitor.   Increased nutrient needs: Body mass index is 18.55 kg/m. -Continue Ensure Enlive   Pressure skin injury: Present on admission. Left buttocks stage II, POA Vertebral column medial stage I, POA      Pain control - [place] Controlled Substance Reporting System database was reviewed. and patient was instructed, not to drive, operate heavy machinery, perform activities at heights, swimming or participation in water activities or provide baby-sitting services while on Pain, Sleep and Anxiety Medications; until their outpatient Physician has advised to do so again. Also recommended to not to take more than prescribed Pain, Sleep and Anxiety Medications.  Consultants: Cardiology, oncology Procedures performed: 2D echo Disposition: Skilled nursing facility Diet recommendation:  Discharge Diet Orders (From admission, onward)     Start     Ordered   12/09/23 0000  Diet - low sodium heart healthy        12/09/23 1057            DISCHARGE MEDICATION: Allergies as of 12/09/2023       Reactions   Ramipril Itching        Medication List     STOP taking these medications    carvedilol 25 MG tablet Commonly known as: COREG   diltiazem 90 MG 12 hr capsule Commonly known as: CARDIZEM SR   Verzenio 100 MG tablet Generic drug: abemaciclib       TAKE these medications    aspirin EC 81 MG tablet Take 81 mg by mouth daily.   Belsomra 10 MG Tabs Generic drug: Suvorexant Take 10 mg by mouth at bedtime.   CALTRATE 600+D PO Take 1 tablet by mouth 2 (two) times daily.   cyanocobalamin 1000 MCG tablet Commonly known as: VITAMIN B12 Take 1,000 mcg by mouth daily.    dexamethasone 2 MG tablet Commonly known as: DECADRON Take 1 tablet (2 mg total) by mouth daily for 3 days. Then off Start taking on: December 10, 2023 What changed:  medication strength how much to take how to take this when to take this additional instructions   diltiazem 240 MG 24 hr capsule Commonly known as: CARDIZEM CD Take 1 capsule (240 mg total) by mouth daily.   feeding supplement Liqd Take 237 mLs by mouth 2 (two) times daily between meals.   hydroxychloroquine 200 MG tablet Commonly known as: PLAQUENIL Take 200 mg by mouth daily.   hydroxyurea 500 MG capsule Commonly known as: HYDREA TAKE 1 CAPSULE BY MOUTH EVERY MONDAY, WEDNESDAY, AND FRIDAY. MAY TAKE WITH FOOD TO MINIMIZE GI SIDE EFFECTS What changed: See the new instructions.   isosorbide mononitrate 60 MG 24 hr tablet Commonly known as: IMDUR Take 1 tablet (60 mg total) by mouth daily.   loperamide 2 MG capsule Commonly known as: IMODIUM Take 1 capsule (2 mg total) by mouth as needed for diarrhea or loose stools.   metoprolol tartrate 25 MG tablet Commonly known as: LOPRESSOR Take 1 tablet (25 mg total) by mouth 2 (two) times daily.   Multivitamin Women 50+ Tabs  Take 1 tablet by mouth daily with breakfast.   oxyCODONE 5 MG immediate release tablet Commonly known as: Oxy IR/ROXICODONE Take 1 tablet (5 mg total) by mouth every 4 (four) hours as needed for up to 5 days for severe pain (pain score 7-10) or moderate pain (pain score 4-6). May take 2 tablets if pain is severe.  Do not drive while taking pain medication.   pantoprazole 20 MG tablet Commonly known as: PROTONIX Take 1 tablet (20 mg total) by mouth daily.   potassium chloride 10 MEQ tablet Commonly known as: KLOR-CON M Take 1 tablet (10 meq) twice daily for 3 days, then 1 tablet daily. What changed:  how much to take how to take this when to take this additional instructions   Proctozone-HC 2.5 % rectal cream Generic drug:  hydrocortisone PLACE 1 APPLICATION RECTALLY 2 TIMES A DAY AS NEEDED FOR HEMORRHOIDS. What changed: See the new instructions.   rosuvastatin 10 MG tablet Commonly known as: CRESTOR Take 10 mg by mouth at bedtime.   sodium bicarbonate 650 MG tablet Take 1 tablet (650 mg total) by mouth 2 (two) times daily.   Sonafine Apply 1 Application topically 2 (two) times daily as needed (Apply to areas of redness from radiation as needed. Call Dr. Colletta Maryland nurse at [phone] if you have any questions).   sucralfate 1 GM/10ML suspension Commonly known as: CARAFATE Take 10 mLs (1 g total) by mouth 4 (four) times daily -  with meals and at bedtime. What changed: when to take this   tiZANidine 4 MG tablet Commonly known as: ZANAFLEX Take 4 mg by mouth 2 (two) times daily as needed for muscle spasms.   triamcinolone cream 0.1 % Commonly known as: KENALOG Apply 1 application  topically as needed (for rashes- affected areas).   Vitamin D3 50 MCG (2000 UT) Tabs Take 2,000 Units by mouth in the morning and at bedtime.               Discharge Care Instructions  (From admission, onward)           Start     Ordered   12/09/23 0000  If the dressing is still on your incision site when you go home, remove it on the third day after your surgery date. Remove dressing if it begins to fall off, or if it is dirty or damaged before the third day.        12/09/23 1057   12/08/23 0000  Discharge wound care:       Comments: Left buttock wound-stage II -Pressure offloading with foam dressing and monitoring   12/08/23 0751            Follow-up Information     Lorenda Ishihara, MD. Schedule an appointment as soon as possible for a visit in 2 week(s).   Specialty: Internal Medicine Why: for hospital follow-up Contact information: [address] [place] [address] [phone]         Ladene Artist, MD Follow up in 2 week(s).   Specialty: Oncology Why: for  hospital follow-up Contact information: [address] [place] [address] [phone]         Laurann Montana, PA-C Follow up on 12/23/2023.   Specialties: Cardiology, Radiology Why: at 2:20PM, for hospital follow-up Contact information: [address] [place] [address] [phone]                Discharge Exam: Ceasar Mons Weights  12/05/23 1504 12/05/23 1822  Weight: 52.2 kg 52.1 kg   S: No acute complaints, wants to go back to SNF today.  Heart rate improved, cleared by cardiology to discharge.  BP (!) 158/69 (BP Location: Right Wrist)   Pulse 100   Temp 97.8 F (36.6 C) (Oral)   Resp 16   Ht 5\' 6"  (1.676 m)   Wt 52.1 kg   LMP 10/07/1974   SpO2 100%   BMI 18.55 kg/m   Physical Exam General: Alert and oriented x 3, NAD Cardiovascular: S1 S2 clear, RRR.  Respiratory: CTAB, no wheezing Gastrointestinal: Soft, nontender, nondistended, NBS Ext: no pedal edema bilaterally Neuro: no new deficits Psych: Normal affect    Condition at discharge: fair  The results of significant diagnostics from this hospitalization (including imaging, microbiology, ancillary and laboratory) are listed below for reference.   Imaging Studies: DG Chest Port 1 View Result Date: 12/05/2023 CLINICAL DATA:  Shortness of breath EXAM: PORTABLE CHEST 1 VIEW COMPARISON:  Chest radiograph dated 04/07/2013 FINDINGS: Low lung volumes with bronchovascular crowding. Bibasilar patchy opacities and dense left retrocardiac opacity. Blunting of the bilateral costophrenic angles. No pneumothorax. The heart size and mediastinal contours are within normal limits. No acute osseous abnormality. IMPRESSION: 1. Low lung volumes with bronchovascular crowding. Bibasilar patchy opacities and dense left retrocardiac opacity, which may represent atelectasis, aspiration, or pneumonia. 2. Blunting of the bilateral costophrenic angles, which may represent small pleural effusions.  Electronically Signed   By: Agustin Cree M.D.   On: 12/05/2023 16:33   DG ESOPHAGUS W SINGLE CM (SOL OR THIN BA) Result Date: 12/01/2023 CLINICAL DATA:  Dysphagia, pain during swallowing. EXAM: ESOPHOGRAM/BARIUM SWALLOW TECHNIQUE: Single contrast examination was performed using  thin barium. FLUOROSCOPY: Radiation Exposure Index (as provided by the fluoroscopic device): 7.0 mGy Kerma COMPARISON:  None Available. FINDINGS: Limited assessment of swallowing due to kyphosis. Poor esophageal motility with stasis of contrast in the esophagus. Gastroesophageal junction does expand somewhat during swallowing but a 13 mm barium tablet would not pass into the stomach. IMPRESSION: 1. Slight narrowing at the gastroesophageal junction, through which a 13 mm barium tablet would not pass. 2. Esophageal dysmotility. Electronically Signed   By: Leanna Battles M.D.   On: 12/01/2023 09:00   ECHOCARDIOGRAM COMPLETE Result Date: 11/25/2023    ECHOCARDIOGRAM REPORT   Patient Name:   MAILEE KLAAS Date of Exam: 11/25/2023 Medical Rec #:  [id number]        Height:       66.0 in Accession #:    [id number]       Weight:       117.9 lb Date of Birth:  1934-09-23       BSA:          1.598 m Patient Age:    89 years         BP:           144/56 mmHg Patient Gender: F                HR:           88 bpm. Exam Location:  Inpatient Procedure: 2D Echo, Cardiac Doppler and Color Doppler (Both Spectral and Color            Flow Doppler were utilized during procedure). Indications:    Chest Pain R07.9  History:        Patient has prior history of Echocardiogram examinations, most  recent 03/22/2022. CAD; Risk Factors:Hypertension.  Sonographer:    Webb Laws Referring Phys: [id number] SUNIT TOLIA IMPRESSIONS  1. Severe basal septal hypertrophy on off axis imaging; maximal septal thickness 20 mm. Maximal apical hypertrophy 20 mm. No resting gradient noted. Left ventricular ejection fraction, by estimation, is 65 to 70%. The left  ventricle has normal function.  The left ventricle has no regional wall motion abnormalities. There is severe asymmetric left ventricular hypertrophy of the basal-septal segment. Left ventricular diastolic function could not be evaluated.  2. Right ventricular systolic function is normal. The right ventricular size is normal.  3. Left atrial size was mildly dilated.  4. The mitral valve is normal in structure. No evidence of mitral valve regurgitation. No evidence of mitral stenosis. The mean mitral valve gradient is 2.0 mmHg. Moderate mitral annular calcification.  5. The aortic valve is tricuspid. There is mild calcification of the aortic valve. Aortic valve regurgitation is mild. No aortic stenosis is present.  6. The inferior vena cava is normal in size with greater than 50% respiratory variability, suggesting right atrial pressure of 3 mmHg. Comparison(s): Prior images reviewed side by side. Aortic regurgitation not seen in 2023 study. Maximal basal septal thickness 14 mm on better quality long axis view. Apex not well seen in 2023 study. Conclusion(s)/Recommendation(s): Consider outpatient CMR for clarification of hypertrophy if clinically indicated. FINDINGS  Left Ventricle: Severe basal septal hypertrophy on off axis imaging; maximal septal thickness 20 mm. Maximal apical hypertrophy 20 mm. No resting gradient noted. Left ventricular ejection fraction, by estimation, is 65 to 70%. The left ventricle has normal function. The left ventricle has no regional wall motion abnormalities. Global longitudinal strain performed but not reported based on interpreter judgement due to suboptimal tracking. The left ventricular internal cavity size was normal in size. There is severe asymmetric left ventricular hypertrophy of the basal-septal segment. Left ventricular diastolic function could not be evaluated due to mitral annular calcification (moderate or greater). Left ventricular diastolic function could not be  evaluated. Right Ventricle: The right ventricular size is normal. No increase in right ventricular wall thickness. Right ventricular systolic function is normal. Left Atrium: Left atrial size was mildly dilated. Right Atrium: Right atrial size was normal in size. Pericardium: There is no evidence of pericardial effusion. Mitral Valve: The mitral valve is normal in structure. Moderate mitral annular calcification. No evidence of mitral valve regurgitation. No evidence of mitral valve stenosis. The mean mitral valve gradient is 2.0 mmHg. Tricuspid Valve: The tricuspid valve is normal in structure. Tricuspid valve regurgitation is mild . No evidence of tricuspid stenosis. Aortic Valve: The aortic valve is tricuspid. There is mild calcification of the aortic valve. Aortic valve regurgitation is mild. No aortic stenosis is present. Pulmonic Valve: The pulmonic valve was not well visualized. Pulmonic valve regurgitation is not visualized. No evidence of pulmonic stenosis. Aorta: The aortic root and ascending aorta are structurally normal, with no evidence of dilitation. Venous: The inferior vena cava is normal in size with greater than 50% respiratory variability, suggesting right atrial pressure of 3 mmHg. IAS/Shunts: The atrial septum is grossly normal. Additional Comments: 3D imaging was not performed.  LEFT VENTRICLE PLAX 2D LVIDd:         4.00 cm   Diastology LVIDs:         3.20 cm   LV e' medial:    5.11 cm/s LV PW:         1.25 cm   LV E/e' medial:  21.7  LV IVS:        1.10 cm   LV e' lateral:   9.03 cm/s LVOT diam:     1.70 cm   LV E/e' lateral: 12.3 LV SV:         51 LV SV Index:   32 LVOT Area:     2.27 cm  RIGHT VENTRICLE             IVC RV Basal diam:  3.00 cm     IVC diam: 1.40 cm RV S prime:     11.60 cm/s TAPSE (M-mode): 1.5 cm LEFT ATRIUM             Index        RIGHT ATRIUM          Index LA diam:        4.50 cm 2.82 cm/m   RA Area:     7.16 cm LA Vol (A2C):   54.4 ml 34.05 ml/m  RA Volume:   13.10  ml 8.20 ml/m LA Vol (A4C):   47.2 ml 29.54 ml/m LA Biplane Vol: 54.4 ml 34.05 ml/m  AORTIC VALVE LVOT Vmax:   145.50 cm/s LVOT Vmean:  89.250 cm/s LVOT VTI:    0.224 m  AORTA Ao Root diam: 2.70 cm Ao Asc diam:  3.10 cm MITRAL VALVE                TRICUSPID VALVE MV Area (PHT): 4.15 cm     TR Peak grad:   25.0 mmHg MV Mean grad:  2.0 mmHg     TR Vmax:        250.00 cm/s MV Decel Time: 183 msec MV E velocity: 111.00 cm/s  SHUNTS                             Systemic VTI:  0.22 m                             Systemic Diam: 1.70 cm Riley Lam MD Electronically signed by Riley Lam MD Signature Date/Time: 11/25/2023/9:28:52 AM    Final    CT ABDOMEN PELVIS WO CONTRAST Result Date: 11/23/2023 CLINICAL DATA:  Metastatic breast cancer. Acute abdominal pain. Acute kidney injury. * Tracking Code: BO * EXAM: CT ABDOMEN AND PELVIS WITHOUT CONTRAST TECHNIQUE: Multidetector CT imaging of the abdomen and pelvis was performed following the standard protocol without IV contrast. RADIATION DOSE REDUCTION: This exam was performed according to the departmental dose-optimization program which includes automated exposure control, adjustment of the mA and/or kV according to patient size and/or use of iterative reconstruction technique. COMPARISON:  10/16/2023 FINDINGS: Lower chest: Right coronary artery descending thoracic aortic atheromatous vascular calcification. Trace pericardial effusion. Mitral valve calcification. Small bilateral pleural effusions with passive atelectasis. Small type 1 hiatal hernia. Scattered small pulmonary nodules in the lung bases as on 10/16/2023, probably from metastatic disease. Hepatobiliary: Scattered hepatic hypodense lesions compatible with metastatic disease. A confluence of two lesions posteriorly in the right hepatic lobe measures 4.1 by 2.2 cm on image 8 series 2. A medial hypodense lesion posteriorly in the right hepatic lobe measures 4.4 by 3.1 cm on image 10 series 2,  formerly about 4.0 by 2.7 cm. Cholecystectomy. Distal common bile duct proximally 9 mm in diameter, borderline prominent for age. Pancreas: Atrophic, otherwise unremarkable. Spleen: Unremarkable Adrenals/Urinary Tract: 2.0 by 2.0 cm right adrenal mass, internal  density 34 Hounsfield units which is indeterminate. Bilateral nonobstructive renal calculi. Mild right hydronephrosis and proximal hydroureter extending to the iliac vessel cross over, however with no distal hydroureter. Foley catheter in the urinary bladder which is mostly empty although there is a small amount of gas in the lumen of the urinary bladder. Stomach/Bowel: Small periampullary duodenal diverticulum. Large caliber distal colon and rectum containing progressive dilution of contrast medium as well as stool contents. Prior bowel resections include the right colon and at least part of the ileum. Vascular/Lymphatic: Atherosclerosis is present, including aortoiliac atherosclerotic disease. Reproductive: Uterus absent.  Adnexa unremarkable. Other: No supplemental non-categorized findings. Musculoskeletal: We partially include what appears to be a compression fracture and possible lytic lesions involving the T11 vertebral body. Suspected right eleventh rib fracture medially. Lucency eccentric to the left in the T12 vertebral body may be from metastatic disease. Bony destructive findings in the posterior elements at L4 suspicious for osseous metastatic disease. Grade 1 anterolisthesis at L5-S1. Lucency in the left iliac wing on image 53 series 4 is probably a metastatic lesion. There is also lucency further medially in the left iliac bone on image 77 series 4. IMPRESSION: 1. Mild right hydronephrosis and proximal hydroureter extending to the iliac vessel cross over, however with no distal hydroureter. This could be from a distal ureteral stricture or from a recently passed calculus. 2. Bilateral nonobstructive renal calculi. 3. Scattered hepatic hypodense  lesions compatible with metastatic disease. 4. Scattered small pulmonary nodules in the lung bases as on 10/16/2023, probably from metastatic disease. 5. 2.0 by 2.0 cm right adrenal mass, internal density 34 Hounsfield units which is indeterminate but quite likely metastatic. 6. We partially include what appears to be a compression fracture and possible lytic lesions involving the T11 vertebral body. Suspected right eleventh rib fracture medially. Bony destructive findings in the posterior elements at L4 suspicious for osseous metastatic disease. Left iliac lytic lesions suspicious for metastatic disease. 7. Small bilateral pleural effusions with passive atelectasis. 8. Small type 1 hiatal hernia. 9. Aortic and coronary atherosclerosis. Aortic Atherosclerosis (ICD10-I70.0). Electronically Signed   By: Gaylyn Rong M.D.   On: 11/23/2023 17:51   US RENAL Result Date: 11/21/2023 CLINICAL DATA:  Acute kidney injury. EXAM: RENAL / URINARY TRACT ULTRASOUND COMPLETE COMPARISON:  CT 10/16/2023 FINDINGS: Right Kidney: Renal measurements: 10.1 x 4.4 x 4.7 cm = volume: 110 mL. Moderate hydronephrosis is new from prior CT. Mild increased renal parenchymal echogenicity typical of chronic renal disease. Renal calculi on prior CT not well-defined on the current exam. No evidence of focal lesion. Left Kidney: Renal measurements: 9 x 4.1 x 4.3 cm = volume: 83 mL. No hydronephrosis. Thinning of renal parenchyma with increased echogenicity. Shadowing 9 mm stone. Small renal cysts, largest 1.7 cm. No evidence of suspicious lesion. Bladder: Appears normal for degree of bladder distention. Other: None. IMPRESSION: 1. Moderate right hydronephrosis is new from prior CT. 2. Increased renal parenchymal echogenicity typical of chronic medical renal disease. 3. Left renal stone. Known right renal calculi on prior CT not well seen. Electronically Signed   By: Narda Rutherford M.D.   On: 11/21/2023 22:06    Microbiology: Results for  orders placed or performed during the hospital encounter of 12/05/23  MRSA Next Gen by PCR, Nasal     Status: None   Collection Time: 12/06/23  6:10 AM   Specimen: Nasal Mucosa; Nasal Swab  Result Value Ref Range Status   MRSA by PCR Next Gen NOT DETECTED NOT  DETECTED Final    Comment: (NOTE) The GeneXpert MRSA Assay (FDA approved for NASAL specimens only), is one component of a comprehensive MRSA colonization surveillance program. It is not intended to diagnose MRSA infection nor to guide or monitor treatment for MRSA infections. Test performance is not FDA approved in patients less than 41 years old. Performed at [facility], [address], [place], [place] [address]     Labs: CBC: Recent Labs  Lab 12/04/23 0604 12/05/23 1536 12/06/23 0424 12/07/23 0401 12/08/23 0508 12/09/23 0659  WBC 3.2* 4.4 4.1 6.1 8.4 10.7*  NEUTROABS 2.6 3.9  --   --   --   --   HGB 8.1* 8.3* 7.6* 7.9* 8.5* 8.4*  HCT 24.6* 24.8* 22.6* 23.9* 24.6* 24.8*  MCV 102.1* 100.8* 99.6 99.2 98.4 100.0  PLT 83* 107* 96* 139* 146* 165   Basic Metabolic Panel: Recent Labs  Lab 12/03/23 0614 12/04/23 0604 12/05/23 1536 12/06/23 0424 12/07/23 0401 12/08/23 0508 12/09/23 0659  NA 137 135 135 132* 135 133* 136  K 2.9* 3.9 4.1 4.2 4.6 4.2 4.3  CL 114* 111 111 109 110 111 114*  CO2 15* 18* 16* 14* 14* 15* 18*  GLUCOSE 104* 128* 166* 171* 223* 179* 142*  BUN 34* 33* 38* 41* 43* 41* 42*  CREATININE 1.15* 1.21* 1.44* 1.50* 1.54* 1.41* 1.32*  CALCIUM 7.5* 7.6* 7.9* 8.0* 8.3* 8.0* 8.1*  MG 1.4* 2.3  --   --   --   --  1.7  PHOS  --   --   --   --   --  2.4* 2.6   Liver Function Tests: Recent Labs  Lab 12/06/23 0424 12/08/23 0508 12/09/23 0659  AST 14*  --   --   ALT 29  --   --   ALKPHOS 61  --   --   BILITOT 0.6  --   --   PROT 4.0*  --   --   ALBUMIN 2.1* 2.2* 2.3*   CBG: Recent Labs  Lab 12/05/23 1556  GLUCAP 152*    Discharge time spent: greater than 30  minutes.  Signed: Thad Ranger, MD [facility] 12/09/2023

## 2023-12-09 NOTE — Progress Notes (Signed)
 Oncology Discharge Planning Note  [facility] Address: [address], [place], [place] [address] Hours of Operation:  Lewayne Bunting, Monday - Friday  Clinic Contact Information:  [phone]) [phone]  Oncology Care Team: Medical Oncologist:  Truett Perna  Patient Details: Name:  [name], [name] MRN:   [id number] DOB:   10-14-1933 Reason for Current Admission: @PPROB @  Discharge Planning Narrative: Notification of admission received by Inpatient team for [facility].  Discharge follow-up appointments for oncology are current and available on the AVS and MyChart.   Upon discharge from the hospital, hematology/oncology's post discharge plan of care for the outpatient setting is:   December 12, 2023 at 1:15 pm  [facility] at [facility] [address] [place], [place] [address]  [phone]  Marquis Diles will be called within two business days after discharge to review hematology/oncology's plan of care for full understanding.    Outpatient Oncology Specific Care Only: Oncology appointment transportation needs addressed?:  no Oncology medication management for symptom management addressed?:  no Chemo Alert Card reviewed?:  Immunotherapy Alert Card reviewed?:  not applicable

## 2023-12-09 NOTE — Progress Notes (Signed)
 Mobility Sp  12/09/23 0912  Mobility  Activity Ambulated with assistance in hallway;Ambulated with assistance in room  Level of Assistance Minimal assist, patient does 75% or more  Assistive Device Front wheel walker  Distance Ambulated (ft) 80 ft  Activity Response Tolerated well  Mobility Referral Yes  Mobility visit 1 Mobility  Mobility Specialist Start Time (ACUTE ONLY) 0912  Mobility Specialist Stop Time (ACUTE ONLY) 0926  Mobility Specialist Time Calculation (min) (ACUTE ONLY) 14 min    During-mobility: HR 145 bpm Post-mobility: HR 107 bpm  Pt agreeable to mobility. Required MinA to stand from EOB and MinG during ambulation. HR up to 145bpm w/ activity w/ no c/o when asked. Pt requested to sit in chair at EOS. Pt left in chair with all needs met.   Caesar Bookman Mobility Specialist Please contact via SecureChat or [employer] [phone]

## 2023-12-09 NOTE — Telephone Encounter (Signed)
 Call from daughter, Jerral Ralph that lives in [place]. She is requesting conversation with MD regarding Mrs. Alegria condition and plans for future treatment. [phone]

## 2023-12-09 NOTE — Progress Notes (Signed)
 Report called to receiving nurse at [facility]. All questions answered. Patient awaiting [facility] transportation.

## 2023-12-09 NOTE — Progress Notes (Addendum)
 Patient Name: [name] Date of Encounter: 12/09/2023 [facility] Cardiologist: Donato Schultz, MD   Interval Summary  .    Patient reports feeling well. She denies any chest pain, shortness of breath, palpitations, dizziness or fatigue Her only complaint at this time is some weakness that she attributes to sitting in the bed  Her HR appears to be 80-100s most of the time, she woke up around 5 AM this morning and went to the bathroom during this time her HR was in 130-150s but she said she did not feel poorly and believes its from her going to the bathroom  Vital Signs .    Vitals:   12/08/23 1334 12/08/23 1425 12/08/23 2010 12/09/23 0533  BP:  (!) 177/87 (!) 157/85 (!) 162/96  Pulse: (!) 112 88  98  Resp:  18 17 20   Temp:  (!) 97.5 F (36.4 C) (!) 97.3 F (36.3 C) (!) 97.5 F (36.4 C)  TempSrc:  Oral Oral Oral  SpO2:  100%  100%  Weight:      Height:       Intake/Output Summary (Last 24 hours) at 12/09/2023 0809 Last data filed at 12/08/2023 1511 Gross per 24 hour  Intake 480 ml  Output --  Net 480 ml      12/05/2023    6:22 PM 12/05/2023    3:04 PM 11/21/2023    3:25 PM  Last 3 Weights  Weight (lbs) 114 lb 14.4 oz 115 lb 117 lb 15.1 oz  Weight (kg) 52.118 kg 52.164 kg 53.5 kg      Telemetry/ECG    Atrial fibrillation, typically rate controlled under 100 bpm with episode 5 AM 3/4 of HR 130-150s patient reports she was getting up and going to the bathroom during this time - Personally Reviewed  Physical Exam .   GEN: No acute distress.   Neck: No JVD Cardiac: irregular rhythm, tachycardic, no murmurs, rubs, or gallops.  Respiratory: Clear to auscultation bilaterally. GI: Soft, nontender, non-distended  MS: No edema  Assessment & Plan .     Paroxysmal atrial fibrillation with episode of RVR Hypertension  Most recent BP 157/85, HR 96 -- has not received morning medications yet  Echo from 2/18 showed EF 65-70%, no LV RWMA, severe LVH, mildly dilated  left atrium, mild AR Presented to ED in atrial fibrillation with HR in 150s Previously diagnosed with AF with RVR mid-February 2025 TSH, free T4 normal CHA2DS2-VASc score of 5 She was deemed not a candidate for anticoagulation due to her co morbidities and thrombocytopenia  Continue with rate control  Patient was placed on IV diltiazem at admission and transitioned to PO  Continue diltiazem 240 mg daily  Continue Lopressor 25 mg BID, started yesterday   CAD Last cath in 2007, showed nonobstructive CAD Stress test in 01/2020 showed no ischemia  Continue ASA 81 mg daily Continue Imdur 60 mg daily Continue Crestor 10 mg daily    Per primary AKI on CKD stage 3a Metastatic breast cancer  Cancer related pain Pancytopenia  Hyponatremia  Metabolic acidosis  Pressure injuries   [facility] will sign off.   Medication Recommendations: Diltiazem 240 mg daily, Lopressor 25 mg twice daily Other recommendations (labs, testing, etc): None Follow up as an outpatient: Appointment scheduled for 12/23/2023   For questions or updates, please contact [facility] Please consult [email or web address] for contact info under        Signed, Olena Leatherwood, PA-C  Patient seen and examined.  Agree with above documentation.  On exam, patient is alert and oriented, irregular, normal rate no murmurs, lungs CTAB, no LE edema or JVD.  Atrial fibrillation rates appear reasonably controlled.  Okay for discharge from cardiac standpoint.  Will discharge on diltiazem and Lopressor as above.  Little Ishikawa, MD

## 2023-12-09 NOTE — Progress Notes (Signed)
 Occupational Therapy Treatment Patient Details Name: [name] MRN: [id number] DOB: 04/18/1934 Today's Date: 12/09/2023   History of present illness BYRD TERRERO is a 89 y.o. female adm 12/05/23 after she started having sudden palpitations, chest tightness, radiating to the left arm, shortness of breath. +afib RVR PMH significant of coronary artery disease, breast cancer, left, atrial fibrillation, hypertrophic cardiomyopathy, dysphagia who was just discharged yesterday 12/04/2023 from [facility]   OT comments  Pt at this time presented in bed but agreed to session. She was further educated on energy conservation methods at this time to complete ADLS. She required CGA to min assist for sit to stand transfers as fatigued through the session. She was able to complete toileting tasks with min assist to  min guard on BSC and then completed oral care while sitting by the sink post set up. Pt still agreeable for continued inpatient follow up therapy, <3 hours/day.       If plan is discharge home, recommend the following:  A little help with bathing/dressing/bathroom;Assistance with cooking/housework;A little help with walking and/or transfers   Equipment Recommendations  None recommended by OT    Recommendations for Other Services      Precautions / Restrictions Precautions Precautions: Fall Recall of Precautions/Restrictions: Intact Precaution/Restrictions Comments: Log Roll, Spinal Precautions Restrictions Weight Bearing Restrictions Per Provider Order: No       Mobility Bed Mobility Overal bed mobility: Needs Assistance Bed Mobility: Supine to Sit, Sit to Supine     Supine to sit: Contact guard Sit to supine: Min assist   General bed mobility comments: BLE assist    Transfers Overall transfer level: Needs assistance Equipment used: Rolling walker (2 wheels) Transfers: Sit to/from Stand Sit to Stand: Min assist           General transfer comment: Pt  CGA to min assist due to fatigued     Balance Overall balance assessment: Needs assistance Sitting-balance support: No upper extremity supported Sitting balance-Leahy Scale: Good     Standing balance support: Bilateral upper extremity supported Standing balance-Leahy Scale: Poor                             ADL either performed or assessed with clinical judgement   ADL Overall ADL's : Needs assistance/impaired Eating/Feeding: Independent   Grooming: Wash/dry hands;Wash/dry face;Set up;Sitting Grooming Details (indicate cue type and reason): at sink Upper Body Bathing: Set up;Sitting       Upper Body Dressing : Set up;Sitting       Toilet Transfer: Minimal assistance;Cueing for safety;Cueing for sequencing;Rolling walker (2 wheels);BSC/3in1   Toileting- Clothing Manipulation and Hygiene: Contact guard assist;Sit to/from stand       Functional mobility during ADLs: Contact guard assist;Rolling walker (2 wheels);Cueing for sequencing;Cueing for safety      Extremity/Trunk Assessment Upper Extremity Assessment Upper Extremity Assessment: Generalized weakness   Lower Extremity Assessment Lower Extremity Assessment: Defer to PT evaluation        Vision       Perception     Praxis     Communication Communication Communication: No apparent difficulties   Cognition Arousal: Alert Behavior During Therapy: WFL for tasks assessed/performed Cognition: No apparent impairments                               Following commands: Intact        Cueing  Cueing Techniques: Verbal cues  Exercises      Shoulder Instructions       General Comments [phone]    Pertinent Vitals/ Pain       Pain Assessment Pain Assessment: Faces Faces Pain Scale: Hurts a little bit Pain Location: buttocks Pain Descriptors / Indicators: Grimacing Pain Intervention(s): Limited activity within patient's tolerance, Monitored during session,  Repositioned  Home Living                                          Prior Functioning/Environment              Frequency  Min 2X/week        Progress Toward Goals  OT Goals(current goals can now be found in the care plan section)  Progress towards OT goals: Progressing toward goals  Acute Rehab OT Goals Patient Stated Goal: to go to rehab OT Goal Formulation: With patient Time For Goal Achievement: 12/20/23 Potential to Achieve Goals: Good ADL Goals Pt Will Perform Grooming: with supervision;standing Pt Will Perform Upper Body Bathing: with supervision;sitting Pt Will Perform Lower Body Bathing: with supervision;sit to/from stand Pt Will Perform Upper Body Dressing: with supervision;sitting Pt Will Perform Lower Body Dressing: with supervision;sit to/from stand Pt Will Transfer to Toilet: with supervision;ambulating Pt Will Perform Toileting - Clothing Manipulation and hygiene: with supervision;sit to/from stand Pt/caregiver will Perform Home Exercise Program: Increased ROM;Increased strength;Both right and left upper extremity;Independently Additional ADL Goal #1: Patient will identify at least 3 energy conservation strategies to employ at home in order to maximize function and quality of life and decrease caregiver burden while preventing exacerbation of symptoms and rehospitalization. Additional ADL Goal #2: Pt will complete any two full ADLs while integrating energy conservation techniques with RPE no greater than 5/10 and VSS.  Plan      Co-evaluation                 AM-PAC OT "6 Clicks" Daily Activity     Outcome Measure   Help from another person eating meals?: None Help from another person taking care of personal grooming?: A Little Help from another person toileting, which includes using toliet, bedpan, or urinal?: A Lot Help from another person bathing (including washing, rinsing, drying)?: A Little Help from another person to put  on and taking off regular upper body clothing?: A Little Help from another person to put on and taking off regular lower body clothing?: A Little 6 Click Score: 18    End of Session Equipment Utilized During Treatment: Gait belt;Rolling walker (2 wheels)  OT Visit Diagnosis: Unsteadiness on feet (R26.81);Pain;Muscle weakness (generalized) (M62.81) Pain - Right/Left:  (bottom)   Activity Tolerance Patient tolerated treatment well   Patient Left in bed;with call bell/phone within reach;with bed alarm set   Nurse Communication Mobility status        Time: 5621-3086 OT Time Calculation (min): 38 min  Charges: OT General Charges $OT Visit: 1 Visit OT Treatments $Self Care/Home Management : 38-52 mins  Presley Raddle OTR/L  Acute Rehab Services  [phone] office number   Alphia Moh 12/09/2023, 12:33 PM

## 2023-12-09 NOTE — TOC Transition Note (Signed)
 Transition of Care [facility]) - Discharge Note   Patient Details  Name: [name] MRN: [id number] Date of Birth: Jul 20, 1934  [facility] CM/SW Contact:  Delilah Shan, LCSWA Phone Number: 12/09/2023, 12:11 PM   Clinical Narrative:     Patient will DC to: [facility]   Anticipated DC date: 12/09/2023  Family notified: [name]   Transport by: Sharin Mons   ?  Per MD patient ready for DC to [facility] . RN, patient, patient's family, and facility notified of DC. Discharge Summary sent to facility. RN given number for report [phone] RM# 112. DC packet on chart. DNR signed by MD attached to patients DC packet. Ambulance transport requested for patient.  CSW signing off.   Final next level of care: Skilled Nursing Facility Barriers to Discharge: No Barriers Identified   Patient Goals and CMS Choice Patient states their goals for this hospitalization and ongoing recovery are:: SNF CMS Medicare.gov Compare Post Acute Care list provided to:: Patient Choice offered to / list presented to : Patient      Discharge Placement              Patient chooses bed at: [facility] Patient to be transferred to facility by: [facility] Name of family member notified: [name] Patient and family notified of of transfer: 12/09/23  Discharge Plan and Services Additional resources added to the After Visit Summary for   In-house Referral: Clinical Social Work                                   Social Drivers of Health (SDOH) Interventions SDOH Screenings   Food Insecurity: No Food Insecurity (12/05/2023)  Housing: Low Risk  (12/06/2023)  Transportation Needs: No Transportation Needs (12/05/2023)  Utilities: Not At Risk (12/05/2023)  Depression (PHQ2-9): Low Risk  (11/11/2023)  Social Connections: Moderately Integrated (12/06/2023)  Tobacco Use: Low Risk  (12/06/2023)     Readmission Risk Interventions    11/23/2023   11:56  AM  Readmission Risk Prevention Plan  Transportation Screening Complete  PCP or Specialist Appt within 5-7 Days Complete  Home Care Screening Complete  Medication Review (RN CM) Complete

## 2023-12-09 NOTE — Progress Notes (Signed)
 IP PROGRESS NOTE  Subjective:   [name] is well-known to me with a history of metastatic breast cancer, treated with Abemaciclib and Faslodex beginning 11/07/2023.  She was admitted to the hospital with failure to thrive, elevated liver enzymes, renal failure, and dehydration on 11/21/2023.  Her clinical status improved with intravenous hydration.  Abemaciclib was placed on hold.  She developed thrombocytopenia in the hospital.  The thrombocytopenia was likely secondary to Abemaciclib.  She was discharged to hospital following completion of palliative radiation to the spine.  She was readmitted 228 with chest pain and rapid atrial fibrillation. [name] reports feeling better.  Back pain remains improved.  She is to return to [facility] to continue physical therapy.  She is ambulating.  Objective: Vital signs in last 24 hours: Blood pressure (!) 162/96, pulse 98, temperature (!) 97.5 F (36.4 C), temperature source Oral, resp. rate 20, height 5\' 6"  (1.676 m), weight 114 lb 14.4 oz (52.1 kg), last menstrual period 10/07/1974, SpO2 100%.  Intake/Output from previous day: 03/03 0701 - 03/04 0700 In: 480 [P.O.:480] Out: 400 [Urine:400]  Physical Exam:  HEENT: No thrush Lungs: Decreased breath sounds at the lower posterior chest bilaterally, no respiratory distress Cardiac: Irregular Abdomen: Nontender, no hepatosplenomegaly Extremities: No leg edema  Lab Results: Recent Labs    12/08/23 0508 12/09/23 0659  WBC 8.4 10.7*  HGB 8.5* 8.4*  HCT 24.6* 24.8*  PLT 146* 165    BMET Recent Labs    12/07/23 0401 12/08/23 0508  NA 135 133*  K 4.6 4.2  CL 110 111  CO2 14* 15*  GLUCOSE 223* 179*  BUN 43* 41*  CREATININE 1.54* 1.41*  CALCIUM 8.3* 8.0*    Lab Results  Component Value Date   CA125 12 07/15/2016    Studies/Results: No results found.  Medications: I have reviewed the patient's current medications.  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  Status post a needle localized lumpectomy and sentinel lymph node biopsy 04/13/2009 with the pathology confirming high-grade ductal carcinoma in situ with an associated 0.26 mm invasive carcinoma, ER positive, PR positive, and HER2 positive. Status post adjuvant right breast radiation completed 06/19/2009. Initiation of Arimidex 05/01/2009.  Completed 5 years in July 2015 Family history of breast cancer. History of Clostridium difficile colitis. Frequent bowel movements following the bowel resection in 2009. 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 CTs 10/16/2023-innumerable lung nodules both lungs; multiple new ill-defined low-density liver lesions; bony metastatic disease in the thoracic and lumbar spine.  Severe compression fracture at T11 with osseous retropulsion.  Small right greater than left pleural effusions. Biopsy liver lesion 10/30/2023-metastatic moderate to poorly differentiated adenocarcinoma consistent with breast origin; HER2 negative (1+), ER  +  100%, PR +30%, Ki-67 60%, ESR 1 nutation positive, PIK3CA negative Faslodex/Abemaciclib 11/07/2023, Abemaciclib discontinued on hospital admission 11/21/2023 Elevated platelet count-essential thrombocytosis 07/31/22- postitive [id number] 03/05/2023-hydroxyurea 500 mg Monday, Wednesday, and Friday CHEK2 pathogenic mutation positive Severe back pain-MRI 10/09/2023 with compression fracture at T11, abnormal signal T9, T10, T11 and T12 as well as several adjacent ribs; right greater than left pleural effusion Palliative radiation to the lower thoracic, lumbar, and upper sacrum 11/18/2023-12/03/2023 12.  Admission 11/21/2023 with acute renal failure, elevated liver enzymes, dehydration, and severe back pain Renal failure and elevated liver enzymes improved, discharged 12/04/2023 13.  Anemia/thrombocytopenia-likely secondary to Abemaciclib, metastatic breast cancer involving the bone marrow, and radiation 14.  Admission 12/05/2023 with chest pain and rapid atrial fibrillation    [name] has metastatic breast cancer.  She began treatment with Faslodex/Abemaciclib 11/07/2023.  She was admitted 11/21/2023 with failure to thrive, renal failure, dehydration, and elevated liver enzymes.  She developed moderate thrombocytopenia while in the hospital.  The clinical presentation was felt to be in part related to toxicity from Abemaciclib.  This has been placed on hold. She is now admitted with rapid atrial fibrillation.  Her clinical status appears improved.  Severe back pain improved following a course of palliative radiation to the spine.  [name] again expressed her desire to continue treatment of the cancer.  The plan is to proceed with single agent Faslodex.  We are unable to give Faslodex as an inpatient.  Faslodex will be resumed as soon as she can return to the cancer center.  Recommendations: Management of atrial fibrillation per cardiology Taper Decadron to off Continue physical  therapy Outpatient follow-up will be scheduled cancer Center to resume Faslodex therapy and monitor anemia   LOS: 4 days   Thornton Papas, MD   12/09/2023, 8:00 AM

## 2023-12-10 ENCOUNTER — Other Ambulatory Visit: Payer: Self-pay | Admitting: *Deleted

## 2023-12-10 NOTE — Patient Outreach (Signed)
 Mrs. [name] resides in [facility].  Screening for potential complex care management services as benefit of health plan and primary care provider.  Collaborative meeting with [name]. Discussed Clinical research associate is following for transition plans and potential care management needs.   Met with Mrs. [name] at bedside to discuss complex care management services. She is agreeable. Confirmed best contact number is home number [phone]. [name] endorses returning from hospital on yesterday. States the plan is to return home at [facility] Daughter lives in [place] and sister lives in [place]. Reports living alone.   Discussed writer will continue to follow along during her SNF stay. Will plan [facility] complex care management referral upon SNF discharge. Provided writer's contact information.   Raiford Noble, MSN, RN, BSN   [facility] RN Post- Acute Care Manager Direct Dial: [phone]

## 2023-12-11 DIAGNOSIS — I10 Essential (primary) hypertension: Secondary | ICD-10-CM | POA: Diagnosis not present

## 2023-12-11 DIAGNOSIS — R2689 Other abnormalities of gait and mobility: Secondary | ICD-10-CM | POA: Diagnosis not present

## 2023-12-11 DIAGNOSIS — R1312 Dysphagia, oropharyngeal phase: Secondary | ICD-10-CM | POA: Diagnosis not present

## 2023-12-11 DIAGNOSIS — R2681 Unsteadiness on feet: Secondary | ICD-10-CM | POA: Diagnosis not present

## 2023-12-11 DIAGNOSIS — R627 Adult failure to thrive: Secondary | ICD-10-CM | POA: Diagnosis not present

## 2023-12-11 DIAGNOSIS — I480 Paroxysmal atrial fibrillation: Secondary | ICD-10-CM | POA: Diagnosis not present

## 2023-12-11 DIAGNOSIS — C7981 Secondary malignant neoplasm of breast: Secondary | ICD-10-CM | POA: Diagnosis not present

## 2023-12-11 DIAGNOSIS — M6281 Muscle weakness (generalized): Secondary | ICD-10-CM | POA: Diagnosis not present

## 2023-12-11 DIAGNOSIS — I4891 Unspecified atrial fibrillation: Secondary | ICD-10-CM | POA: Diagnosis not present

## 2023-12-11 DIAGNOSIS — C50412 Malignant neoplasm of upper-outer quadrant of left female breast: Secondary | ICD-10-CM | POA: Diagnosis not present

## 2023-12-12 ENCOUNTER — Inpatient Hospital Stay

## 2023-12-12 ENCOUNTER — Inpatient Hospital Stay: Attending: Oncology

## 2023-12-12 VITALS — BP 134/50 | HR 85 | Temp 98.2°F | Resp 16

## 2023-12-12 DIAGNOSIS — C50912 Malignant neoplasm of unspecified site of left female breast: Secondary | ICD-10-CM | POA: Diagnosis not present

## 2023-12-12 DIAGNOSIS — Z5111 Encounter for antineoplastic chemotherapy: Secondary | ICD-10-CM | POA: Diagnosis not present

## 2023-12-12 DIAGNOSIS — C50919 Malignant neoplasm of unspecified site of unspecified female breast: Secondary | ICD-10-CM

## 2023-12-12 DIAGNOSIS — C787 Secondary malignant neoplasm of liver and intrahepatic bile duct: Secondary | ICD-10-CM | POA: Diagnosis not present

## 2023-12-12 DIAGNOSIS — Z170 Estrogen receptor positive status [ER+]: Secondary | ICD-10-CM | POA: Insufficient documentation

## 2023-12-12 LAB — CMP (CANCER CENTER ONLY)
ALT: 24 U/L (ref 0–44)
AST: 14 U/L — ABNORMAL LOW (ref 15–41)
Albumin: 2.8 g/dL — ABNORMAL LOW (ref 3.5–5.0)
Alkaline Phosphatase: 57 U/L (ref 38–126)
Anion gap: 4 — ABNORMAL LOW (ref 5–15)
BUN: 41 mg/dL — ABNORMAL HIGH (ref 8–23)
CO2: 22 mmol/L (ref 22–32)
Calcium: 7.6 mg/dL — ABNORMAL LOW (ref 8.9–10.3)
Chloride: 112 mmol/L — ABNORMAL HIGH (ref 98–111)
Creatinine: 1.09 mg/dL — ABNORMAL HIGH (ref 0.44–1.00)
GFR, Estimated: 49 mL/min — ABNORMAL LOW (ref >60)
Glucose, Bld: 169 mg/dL — ABNORMAL HIGH (ref 70–99)
Potassium: 5.1 mmol/L (ref 3.5–5.1)
Sodium: 138 mmol/L (ref 135–145)
Total Bilirubin: 0.7 mg/dL (ref 0.0–1.2)
Total Protein: 4.1 g/dL — ABNORMAL LOW (ref 6.5–8.1)

## 2023-12-12 LAB — CBC WITH DIFFERENTIAL (CANCER CENTER ONLY)
Abs Immature Granulocytes: 1.26 10*3/uL — ABNORMAL HIGH (ref 0.00–0.07)
Basophils Absolute: 0.1 10*3/uL (ref 0.0–0.1)
Basophils Relative: 0 %
Eosinophils Absolute: 0 10*3/uL (ref 0.0–0.5)
Eosinophils Relative: 0 %
HCT: 25.2 % — ABNORMAL LOW (ref 36.0–46.0)
Hemoglobin: 8.5 g/dL — ABNORMAL LOW (ref 12.0–15.0)
Immature Granulocytes: 7 %
Lymphocytes Relative: 3 %
Lymphs Abs: 0.7 10*3/uL (ref 0.7–4.0)
MCH: 33.6 pg (ref 26.0–34.0)
MCHC: 33.7 g/dL (ref 30.0–36.0)
MCV: 99.6 fL (ref 80.0–100.0)
Monocytes Absolute: 0.4 10*3/uL (ref 0.1–1.0)
Monocytes Relative: 2 %
Neutro Abs: 16.9 10*3/uL — ABNORMAL HIGH (ref 1.7–7.7)
Neutrophils Relative %: 88 %
Platelet Count: 174 10*3/uL (ref 150–400)
RBC: 2.53 MIL/uL — ABNORMAL LOW (ref 3.87–5.11)
RDW: 17.2 % — ABNORMAL HIGH (ref 11.5–15.5)
Smear Review: ADEQUATE
WBC Count: 19.2 10*3/uL — ABNORMAL HIGH (ref 4.0–10.5)
nRBC: 0.1 % (ref 0.0–0.2)

## 2023-12-12 MED ORDER — FULVESTRANT 250 MG/5ML IM SOSY
500.0000 mg | PREFILLED_SYRINGE | Freq: Once | INTRAMUSCULAR | Status: AC
  Administered 2023-12-12: 500 mg via INTRAMUSCULAR
  Filled 2023-12-12: qty 10

## 2023-12-15 ENCOUNTER — Telehealth: Payer: Self-pay | Admitting: *Deleted

## 2023-12-15 DIAGNOSIS — R2681 Unsteadiness on feet: Secondary | ICD-10-CM | POA: Diagnosis not present

## 2023-12-15 DIAGNOSIS — C50412 Malignant neoplasm of upper-outer quadrant of left female breast: Secondary | ICD-10-CM | POA: Diagnosis not present

## 2023-12-15 DIAGNOSIS — I480 Paroxysmal atrial fibrillation: Secondary | ICD-10-CM | POA: Diagnosis not present

## 2023-12-15 DIAGNOSIS — M6281 Muscle weakness (generalized): Secondary | ICD-10-CM | POA: Diagnosis not present

## 2023-12-15 DIAGNOSIS — R1312 Dysphagia, oropharyngeal phase: Secondary | ICD-10-CM | POA: Diagnosis not present

## 2023-12-15 DIAGNOSIS — R2689 Other abnormalities of gait and mobility: Secondary | ICD-10-CM | POA: Diagnosis not present

## 2023-12-15 DIAGNOSIS — E43 Unspecified severe protein-calorie malnutrition: Secondary | ICD-10-CM | POA: Diagnosis not present

## 2023-12-15 NOTE — Telephone Encounter (Signed)
 Daughter called with concern that at last visit she noted that [name] seems SOB even at rest in chair. Asking if she needs oxygen? Inquired if she voiced her concern while she was at the facility and she did not. Encouraged her to call facility and express her concern and have someone assess her oxygen sat, breath sounds. May need a CXR. She agrees. This RN tried x 2 to reach [address] without success. Sent fax w/note re: daughter's concern.

## 2023-12-16 DIAGNOSIS — G4700 Insomnia, unspecified: Secondary | ICD-10-CM | POA: Diagnosis not present

## 2023-12-18 DIAGNOSIS — R2681 Unsteadiness on feet: Secondary | ICD-10-CM | POA: Diagnosis not present

## 2023-12-18 DIAGNOSIS — E43 Unspecified severe protein-calorie malnutrition: Secondary | ICD-10-CM | POA: Diagnosis not present

## 2023-12-18 DIAGNOSIS — R1312 Dysphagia, oropharyngeal phase: Secondary | ICD-10-CM | POA: Diagnosis not present

## 2023-12-18 DIAGNOSIS — M6281 Muscle weakness (generalized): Secondary | ICD-10-CM | POA: Diagnosis not present

## 2023-12-18 DIAGNOSIS — I480 Paroxysmal atrial fibrillation: Secondary | ICD-10-CM | POA: Diagnosis not present

## 2023-12-18 DIAGNOSIS — R2689 Other abnormalities of gait and mobility: Secondary | ICD-10-CM | POA: Diagnosis not present

## 2023-12-18 DIAGNOSIS — C50412 Malignant neoplasm of upper-outer quadrant of left female breast: Secondary | ICD-10-CM | POA: Diagnosis not present

## 2023-12-18 DIAGNOSIS — I4891 Unspecified atrial fibrillation: Secondary | ICD-10-CM | POA: Diagnosis not present

## 2023-12-22 DIAGNOSIS — E43 Unspecified severe protein-calorie malnutrition: Secondary | ICD-10-CM | POA: Diagnosis not present

## 2023-12-22 DIAGNOSIS — R2681 Unsteadiness on feet: Secondary | ICD-10-CM | POA: Diagnosis not present

## 2023-12-22 DIAGNOSIS — C50412 Malignant neoplasm of upper-outer quadrant of left female breast: Secondary | ICD-10-CM | POA: Diagnosis not present

## 2023-12-22 DIAGNOSIS — L89320 Pressure ulcer of left buttock, unstageable: Secondary | ICD-10-CM | POA: Diagnosis not present

## 2023-12-22 DIAGNOSIS — I480 Paroxysmal atrial fibrillation: Secondary | ICD-10-CM | POA: Diagnosis not present

## 2023-12-22 DIAGNOSIS — M6281 Muscle weakness (generalized): Secondary | ICD-10-CM | POA: Diagnosis not present

## 2023-12-22 DIAGNOSIS — R1312 Dysphagia, oropharyngeal phase: Secondary | ICD-10-CM | POA: Diagnosis not present

## 2023-12-22 DIAGNOSIS — R2689 Other abnormalities of gait and mobility: Secondary | ICD-10-CM | POA: Diagnosis not present

## 2023-12-22 NOTE — Progress Notes (Unsigned)
 Cardiology Office Note    Date:  12/23/2023  ID:  [name], DOB [birth year], MRN [id number] PCP:  Alison Ishihara, MD  Cardiologist:  Alison Schultz, MD  Electrophysiologist:  None   Chief Complaint: SOB  History of Present Illness: [name]    [name] is a 89 y.o. female with visit-pertinent history of breast cancer with metastasis to liver, peritoneum, and spine, prior ovarian cancer, moderate CAD with chronic episodic chest tightness, HTN, hypertrophic cardiomyoapthy, probable chronic HFpEF, paroxysmal atrial fibrillation  normal renal artery duplex 2017, HLD, GERD, kidney stones, carotid artery disease (1-39% BICA 03/2022 with >50% L CCA), PACs/atrial bigeminy, essential thrombocytosis seen for follow-up.  [name] was previously followed by Dr. Katrinka Powell then more recently Dr. Anne [facility]. Remote cath 2007 showed moderate LAD disease with 50% narrowing after D2, 50% prox D2, 30-40% PDA, irregularities in mid RCA, elevated LVEDP with suspected diastolic dysfunction. She has had episodic chest tightness in outpatient visits. Stress test 2021 showed small fixed defect in anteroapex and mid anterior wall at stress and rest question breast attenuation artifact, cannot exclude small area of prior infarct, low risk study without ischemia, managed medically. Echo 03/2022 showed EF 65-70%, G3DD, mild LVH of basal septal segment, mildly elevated PASP, mild MR. At last OV 02/2023, Dr. Anne Powell indicated she had h/o suspected microvascular disease, but also raised question of GERD/MSK contributing to chest discomfort. CHF is listed in prior problem lists, suspect HFpEF without prior need for loop diuretic.   She was admitted twice recently for multiple medical issues in the setting of her comorbidities, complicated by chest pain and AF RVR (issues with AKI, dysphagia, diarrhea, failure to thrive, pancytopenia, malnutrition, pressure injury). 2D Echo 11/25/23 showed EF 65-70%, severe basal septal  hypertrophy on off axis imaging; maximal septal thickness 20 mm, maximal apical hypertrophy 20 mm, no resting gradient noted, mild LAE, mild AI. She was not anticoagulated due to comorbidities and thrombocytopenia. She was treated with rate control strategy.  She returns for follow-up with her daughter. [facility] is [facility] living [name]. For the past several days she has been having increasing SOB, orthopnea, and LE edema. It is difficult for her to define when exactly this began worsening. It feels like she can't get a deep breath. She is weak. She has not had any chest pain recently. No fever or chills. She is back in rapid afib today and appears volume overloaded.  Labwork independently reviewed: 12/2023 Hgb 8.5, plt ok, K 5.1, Cr 1.09, ca 7.6, alb 2.8, AST ALT OK, Mg 1.7 11/2023 Troponin peak 54, TSH ok We do not follow lipids  ROS: .    Please see the history of present illness.  All other systems are reviewed and otherwise negative.  Studies Reviewed: [name]    EKG:  EKG is ordered today, personally reviewed, demonstrating:  EKG Interpretation Date/Time:  Tuesday December 23 2023 14:09:45 EDT Ventricular Rate:  140 PR Interval:    QRS Duration:  70 QT Interval:  260 QTC Calculation: 396 R Axis:   -3  Text Interpretation: Atrial fibrillation with rapid ventricular response Low voltage QRS Septal infarct , age undetermined Nonspecific ST and T wave abnormality Confirmed by Ronie Spies [id number]) on 12/23/2023 2:18:02 PM   CV Studies: Cardiac studies reviewed are outlined and summarized above. Otherwise please see EMR for full report.   Current Reported Medications:.    Current Meds  Medication Sig   aspirin EC 81 MG tablet Take  81 mg by mouth daily.   BELSOMRA 10 MG TABS Take 10 mg by mouth at bedtime.   Calcium Carbonate-Vitamin D (CALTRATE 600+D PO) Take 1 tablet by mouth 2 (two) times daily.   Cholecalciferol (VITAMIN D3) 50 MCG (2000 UT) TABS Take 2,000 Units by mouth  in the morning and at bedtime.   cyanocobalamin (VITAMIN B12) 1000 MCG tablet Take 1,000 mcg by mouth daily.   diltiazem (CARDIZEM CD) 240 MG 24 hr capsule Take 1 capsule (240 mg total) by mouth daily.   feeding supplement (ENSURE ENLIVE / ENSURE PLUS) LIQD Take 237 mLs by mouth 2 (two) times daily between meals.   hydroxychloroquine (PLAQUENIL) 200 MG tablet Take 200 mg by mouth daily.   hydroxyurea (HYDREA) 500 MG capsule TAKE 1 CAPSULE BY MOUTH EVERY MONDAY, WEDNESDAY, AND FRIDAY. MAY TAKE WITH FOOD TO MINIMIZE GI SIDE EFFECTS (Patient taking differently: Take 500 mg by mouth every Monday, Wednesday, and Friday. May take with food to minimize GI side effects.)   isosorbide mononitrate (IMDUR) 60 MG 24 hr tablet Take 1 tablet (60 mg total) by mouth daily.   loperamide (IMODIUM) 2 MG capsule Take 1 capsule (2 mg total) by mouth as needed for diarrhea or loose stools.   metoprolol tartrate (LOPRESSOR) 25 MG tablet Take 1 tablet (25 mg total) by mouth 2 (two) times daily.   Multiple Vitamins-Minerals (MULTIVITAMIN WOMEN 50+) TABS Take 1 tablet by mouth daily with breakfast.   pantoprazole (PROTONIX) 20 MG tablet Take 1 tablet (20 mg total) by mouth daily.   potassium chloride (KLOR-CON M) 10 MEQ tablet Take 1 tablet (10 meq) twice daily for 3 days, then 1 tablet daily. (Patient taking differently: Take 10 mEq by mouth 2 (two) times daily.)   PROCTOZONE-HC 2.5 % rectal cream PLACE 1 APPLICATION RECTALLY 2 TIMES A DAY AS NEEDED FOR HEMORRHOIDS. (Patient taking differently: Place 1 application  rectally 2 (two) times daily as needed for hemorrhoids.)   rosuvastatin (CRESTOR) 10 MG tablet Take 10 mg by mouth at bedtime.   sodium bicarbonate 650 MG tablet Take 1 tablet (650 mg total) by mouth 2 (two) times daily.   sucralfate (CARAFATE) 1 GM/10ML suspension Take 10 mLs (1 g total) by mouth 4 (four) times daily -  with meals and at bedtime. (Patient taking differently: Take 1 g by mouth with breakfast, with  lunch, and with evening meal.)   tiZANidine (ZANAFLEX) 4 MG tablet Take 4 mg by mouth 2 (two) times daily as needed for muscle spasms.   triamcinolone (KENALOG) 0.1 % Apply 1 application  topically as needed (for rashes- affected areas).   Wound Dressings (SONAFINE) Apply 1 Application topically 2 (two) times daily as needed (Apply to areas of redness from radiation as needed. Call Dr. Colletta Maryland nurse at [phone] if you have any questions).    Physical Exam:    VS:  BP (!) 166/80   Pulse (!) 140   Ht 5\' 6"  (1.676 m)   Wt 115 lb (52.2 kg)   LMP 10/07/1974   SpO2 97%   BMI 18.56 kg/m    Wt Readings from Last 3 Encounters:  12/23/23 115 lb (52.2 kg)  12/05/23 114 lb 14.4 oz (52.1 kg)  11/21/23 117 lb 15.1 oz (53.5 kg)    GEN: Frail WF in no acute distress NECK: No JVD; No carotid bruits CARDIAC: irregularly irregular, no murmurs, rubs, gallops RESPIRATORY:  Absent BS at bases without rales, wheezing or rhonchi, wheezing or rhonchi  - severe  scoliosis/kyphotic changes of the back ABDOMEN: Soft, non-tender, non-distended EXTREMITIES:  Doughy 2+ pitting BLE edema; No acute deformity   Asessement and Plan:.    1. PAF with RVR - the patient has had issues with persistent afib recently in the setting of numerous physiologic stressors and is back in RVR today in clinic with HR in the 140s. She also complains of SOB and significant edema. She appears volume overloaded. I am concerned about her clinical condition and the complexity of her recent admissions. I do not think her symptoms can be adequately addressed and have recommended she proceed to the ED for evaluation. They would prefer [facility] given oncologic care on site if needed. Briefly d/w DOD Dr. Elberta Fortis - we would recommend admission to the medicine service and cardiology can consult if needed. I have alerted Trish, cardmaster, of this update. Anticipate she would benefit from intensification of rate control (likely IV diltiazem to start)  and IV Lasix contigent on labs and renal function. She was not previously deemed a candidate for anticogulation therefore would not plan on cardioversion at this time. Given her overall clinical picture the focus should be on alleviation of symptoms.  2. Acute on chronic HFpEF, HTN - plan as outlined above. Needs workup in ED before determining next steps for diuresis. Lungs sound like bilateral pleural effusions though she has a restrictive physiology with her kyphosis. I think she likely has third spacing from low albumin as well. Appears very frail. Re: metric, deferred trending BP given plan for ED.  3. Hypertrophic cardiomyopathy - no significant murmur on exam today. Fortunately no recent chest pain either. Follow clinically in setting of above.  4. CAD - continue baby aspirin as tolerated if Hgb/Plt are stable, otherwise would hold. Continue rosuvastatin if this aligns with continued GOC. Follow clinically.    Disposition: F/u TBD post-hospitalization as I anticipate she will be admitted to the hospital for further management.  Signed, Laurann Montana, PA-C

## 2023-12-23 ENCOUNTER — Ambulatory Visit (INDEPENDENT_AMBULATORY_CARE_PROVIDER_SITE_OTHER): Payer: Medicare Other | Admitting: Physician Assistant

## 2023-12-23 ENCOUNTER — Inpatient Hospital Stay (HOSPITAL_COMMUNITY)
Admission: EM | Admit: 2023-12-23 | Discharge: 2023-12-29 | DRG: 291 | Disposition: A | Discharge status: Skilled Nursing Facility | Source: Skilled Nursing Facility | Attending: Internal Medicine | Admitting: Internal Medicine

## 2023-12-23 ENCOUNTER — Encounter: Payer: Self-pay | Admitting: Oncology

## 2023-12-23 ENCOUNTER — Other Ambulatory Visit: Payer: Self-pay

## 2023-12-23 ENCOUNTER — Encounter (HOSPITAL_COMMUNITY): Payer: Self-pay

## 2023-12-23 ENCOUNTER — Emergency Department (HOSPITAL_COMMUNITY)

## 2023-12-23 ENCOUNTER — Encounter: Payer: Self-pay | Admitting: Physician Assistant

## 2023-12-23 VITALS — BP 166/80 | HR 140 | Ht 66.0 in | Wt 115.0 lb

## 2023-12-23 DIAGNOSIS — Z170 Estrogen receptor positive status [ER+]: Secondary | ICD-10-CM

## 2023-12-23 DIAGNOSIS — I4821 Permanent atrial fibrillation: Secondary | ICD-10-CM | POA: Diagnosis not present

## 2023-12-23 DIAGNOSIS — E878 Other disorders of electrolyte and fluid balance, not elsewhere classified: Secondary | ICD-10-CM

## 2023-12-23 DIAGNOSIS — C7801 Secondary malignant neoplasm of right lung: Secondary | ICD-10-CM | POA: Diagnosis present

## 2023-12-23 DIAGNOSIS — C50912 Malignant neoplasm of unspecified site of left female breast: Secondary | ICD-10-CM | POA: Diagnosis present

## 2023-12-23 DIAGNOSIS — I422 Other hypertrophic cardiomyopathy: Secondary | ICD-10-CM | POA: Diagnosis present

## 2023-12-23 DIAGNOSIS — Z83438 Family history of other disorder of lipoprotein metabolism and other lipidemia: Secondary | ICD-10-CM

## 2023-12-23 DIAGNOSIS — I5032 Chronic diastolic (congestive) heart failure: Secondary | ICD-10-CM | POA: Diagnosis not present

## 2023-12-23 DIAGNOSIS — D473 Essential (hemorrhagic) thrombocythemia: Secondary | ICD-10-CM | POA: Diagnosis present

## 2023-12-23 DIAGNOSIS — N1832 Chronic kidney disease, stage 3b: Secondary | ICD-10-CM | POA: Diagnosis not present

## 2023-12-23 DIAGNOSIS — E43 Unspecified severe protein-calorie malnutrition: Secondary | ICD-10-CM | POA: Diagnosis not present

## 2023-12-23 DIAGNOSIS — Z7982 Long term (current) use of aspirin: Secondary | ICD-10-CM

## 2023-12-23 DIAGNOSIS — L89320 Pressure ulcer of left buttock, unstageable: Secondary | ICD-10-CM | POA: Diagnosis not present

## 2023-12-23 DIAGNOSIS — Z8543 Personal history of malignant neoplasm of ovary: Secondary | ICD-10-CM

## 2023-12-23 DIAGNOSIS — C50412 Malignant neoplasm of upper-outer quadrant of left female breast: Secondary | ICD-10-CM | POA: Diagnosis not present

## 2023-12-23 DIAGNOSIS — I480 Paroxysmal atrial fibrillation: Secondary | ICD-10-CM | POA: Insufficient documentation

## 2023-12-23 DIAGNOSIS — Z1732 Human epidermal growth factor receptor 2 negative status: Secondary | ICD-10-CM

## 2023-12-23 DIAGNOSIS — I4891 Unspecified atrial fibrillation: Principal | ICD-10-CM

## 2023-12-23 DIAGNOSIS — Z1731 Human epidermal growth factor receptor 2 positive status: Secondary | ICD-10-CM

## 2023-12-23 DIAGNOSIS — E785 Hyperlipidemia, unspecified: Secondary | ICD-10-CM | POA: Diagnosis present

## 2023-12-23 DIAGNOSIS — M6281 Muscle weakness (generalized): Secondary | ICD-10-CM | POA: Diagnosis not present

## 2023-12-23 DIAGNOSIS — C50919 Malignant neoplasm of unspecified site of unspecified female breast: Secondary | ICD-10-CM | POA: Diagnosis present

## 2023-12-23 DIAGNOSIS — Z8619 Personal history of other infectious and parasitic diseases: Secondary | ICD-10-CM

## 2023-12-23 DIAGNOSIS — I2510 Atherosclerotic heart disease of native coronary artery without angina pectoris: Secondary | ICD-10-CM | POA: Insufficient documentation

## 2023-12-23 DIAGNOSIS — I10 Essential (primary) hypertension: Secondary | ICD-10-CM | POA: Diagnosis not present

## 2023-12-23 DIAGNOSIS — I4819 Other persistent atrial fibrillation: Secondary | ICD-10-CM | POA: Diagnosis not present

## 2023-12-23 DIAGNOSIS — Z9049 Acquired absence of other specified parts of digestive tract: Secondary | ICD-10-CM

## 2023-12-23 DIAGNOSIS — I5033 Acute on chronic diastolic (congestive) heart failure: Secondary | ICD-10-CM | POA: Insufficient documentation

## 2023-12-23 DIAGNOSIS — E876 Hypokalemia: Secondary | ICD-10-CM | POA: Diagnosis present

## 2023-12-23 DIAGNOSIS — Z9181 History of falling: Secondary | ICD-10-CM

## 2023-12-23 DIAGNOSIS — C7802 Secondary malignant neoplasm of left lung: Secondary | ICD-10-CM | POA: Diagnosis present

## 2023-12-23 DIAGNOSIS — I2489 Other forms of acute ischemic heart disease: Secondary | ICD-10-CM | POA: Diagnosis not present

## 2023-12-23 DIAGNOSIS — J9811 Atelectasis: Secondary | ICD-10-CM | POA: Diagnosis not present

## 2023-12-23 DIAGNOSIS — Z66 Do not resuscitate: Secondary | ICD-10-CM | POA: Diagnosis present

## 2023-12-23 DIAGNOSIS — D539 Nutritional anemia, unspecified: Secondary | ICD-10-CM | POA: Diagnosis present

## 2023-12-23 DIAGNOSIS — D696 Thrombocytopenia, unspecified: Secondary | ICD-10-CM | POA: Diagnosis not present

## 2023-12-23 DIAGNOSIS — K219 Gastro-esophageal reflux disease without esophagitis: Secondary | ICD-10-CM | POA: Diagnosis not present

## 2023-12-23 DIAGNOSIS — Z90722 Acquired absence of ovaries, bilateral: Secondary | ICD-10-CM

## 2023-12-23 DIAGNOSIS — Z923 Personal history of irradiation: Secondary | ICD-10-CM

## 2023-12-23 DIAGNOSIS — J810 Acute pulmonary edema: Secondary | ICD-10-CM

## 2023-12-23 DIAGNOSIS — I509 Heart failure, unspecified: Secondary | ICD-10-CM | POA: Diagnosis not present

## 2023-12-23 DIAGNOSIS — C787 Secondary malignant neoplasm of liver and intrahepatic bile duct: Secondary | ICD-10-CM | POA: Diagnosis present

## 2023-12-23 DIAGNOSIS — Z8249 Family history of ischemic heart disease and other diseases of the circulatory system: Secondary | ICD-10-CM

## 2023-12-23 DIAGNOSIS — N179 Acute kidney failure, unspecified: Secondary | ICD-10-CM | POA: Diagnosis not present

## 2023-12-23 DIAGNOSIS — Z79818 Long term (current) use of other agents affecting estrogen receptors and estrogen levels: Secondary | ICD-10-CM

## 2023-12-23 DIAGNOSIS — Z7401 Bed confinement status: Secondary | ICD-10-CM | POA: Diagnosis not present

## 2023-12-23 DIAGNOSIS — C786 Secondary malignant neoplasm of retroperitoneum and peritoneum: Secondary | ICD-10-CM | POA: Diagnosis present

## 2023-12-23 DIAGNOSIS — Z90710 Acquired absence of both cervix and uterus: Secondary | ICD-10-CM

## 2023-12-23 DIAGNOSIS — R627 Adult failure to thrive: Secondary | ICD-10-CM | POA: Diagnosis not present

## 2023-12-23 DIAGNOSIS — D61818 Other pancytopenia: Secondary | ICD-10-CM | POA: Diagnosis not present

## 2023-12-23 DIAGNOSIS — R54 Age-related physical debility: Secondary | ICD-10-CM | POA: Diagnosis present

## 2023-12-23 DIAGNOSIS — I5043 Acute on chronic combined systolic (congestive) and diastolic (congestive) heart failure: Secondary | ICD-10-CM | POA: Diagnosis not present

## 2023-12-23 DIAGNOSIS — R000 Tachycardia, unspecified: Secondary | ICD-10-CM | POA: Diagnosis not present

## 2023-12-23 DIAGNOSIS — C7951 Secondary malignant neoplasm of bone: Secondary | ICD-10-CM | POA: Diagnosis present

## 2023-12-23 DIAGNOSIS — A0472 Enterocolitis due to Clostridium difficile, not specified as recurrent: Secondary | ICD-10-CM | POA: Diagnosis present

## 2023-12-23 DIAGNOSIS — Z961 Presence of intraocular lens: Secondary | ICD-10-CM | POA: Diagnosis present

## 2023-12-23 DIAGNOSIS — I129 Hypertensive chronic kidney disease with stage 1 through stage 4 chronic kidney disease, or unspecified chronic kidney disease: Secondary | ICD-10-CM | POA: Diagnosis not present

## 2023-12-23 DIAGNOSIS — Z85828 Personal history of other malignant neoplasm of skin: Secondary | ICD-10-CM

## 2023-12-23 DIAGNOSIS — R0602 Shortness of breath: Secondary | ICD-10-CM | POA: Diagnosis not present

## 2023-12-23 DIAGNOSIS — Z79899 Other long term (current) drug therapy: Secondary | ICD-10-CM | POA: Diagnosis not present

## 2023-12-23 DIAGNOSIS — R1313 Dysphagia, pharyngeal phase: Secondary | ICD-10-CM | POA: Diagnosis not present

## 2023-12-23 DIAGNOSIS — L89152 Pressure ulcer of sacral region, stage 2: Secondary | ICD-10-CM | POA: Diagnosis present

## 2023-12-23 DIAGNOSIS — E8779 Other fluid overload: Secondary | ICD-10-CM

## 2023-12-23 DIAGNOSIS — R531 Weakness: Secondary | ICD-10-CM | POA: Diagnosis not present

## 2023-12-23 DIAGNOSIS — Z803 Family history of malignant neoplasm of breast: Secondary | ICD-10-CM

## 2023-12-23 DIAGNOSIS — R2689 Other abnormalities of gait and mobility: Secondary | ICD-10-CM | POA: Diagnosis not present

## 2023-12-23 DIAGNOSIS — D649 Anemia, unspecified: Secondary | ICD-10-CM | POA: Diagnosis not present

## 2023-12-23 DIAGNOSIS — I130 Hypertensive heart and chronic kidney disease with heart failure and stage 1 through stage 4 chronic kidney disease, or unspecified chronic kidney disease: Principal | ICD-10-CM | POA: Diagnosis present

## 2023-12-23 DIAGNOSIS — R1312 Dysphagia, oropharyngeal phase: Secondary | ICD-10-CM | POA: Diagnosis not present

## 2023-12-23 DIAGNOSIS — R2681 Unsteadiness on feet: Secondary | ICD-10-CM | POA: Diagnosis not present

## 2023-12-23 DIAGNOSIS — D631 Anemia in chronic kidney disease: Secondary | ICD-10-CM | POA: Diagnosis present

## 2023-12-23 DIAGNOSIS — J90 Pleural effusion, not elsewhere classified: Secondary | ICD-10-CM | POA: Diagnosis not present

## 2023-12-23 LAB — CBC
HCT: 24.4 % — ABNORMAL LOW (ref 36.0–46.0)
Hemoglobin: 7.7 g/dL — ABNORMAL LOW (ref 12.0–15.0)
MCH: 32.9 pg (ref 26.0–34.0)
MCHC: 31.6 g/dL (ref 30.0–36.0)
MCV: 104.3 fL — ABNORMAL HIGH (ref 80.0–100.0)
Platelets: 197 10*3/uL (ref 150–400)
RBC: 2.34 MIL/uL — ABNORMAL LOW (ref 3.87–5.11)
RDW: 18.2 % — ABNORMAL HIGH (ref 11.5–15.5)
WBC: 12.6 10*3/uL — ABNORMAL HIGH (ref 4.0–10.5)
nRBC: 0 % (ref 0.0–0.2)

## 2023-12-23 LAB — BASIC METABOLIC PANEL
Anion gap: 5 (ref 5–15)
BUN: 40 mg/dL — ABNORMAL HIGH (ref 8–23)
CO2: 23 mmol/L (ref 22–32)
Calcium: 8 mg/dL — ABNORMAL LOW (ref 8.9–10.3)
Chloride: 115 mmol/L — ABNORMAL HIGH (ref 98–111)
Creatinine, Ser: 1.21 mg/dL — ABNORMAL HIGH (ref 0.44–1.00)
GFR, Estimated: 43 mL/min — ABNORMAL LOW (ref >60)
Glucose, Bld: 172 mg/dL — ABNORMAL HIGH (ref 70–99)
Potassium: 4.4 mmol/L (ref 3.5–5.1)
Sodium: 143 mmol/L (ref 135–145)

## 2023-12-23 LAB — BRAIN NATRIURETIC PEPTIDE: B Natriuretic Peptide: 2212.1 pg/mL — ABNORMAL HIGH (ref 0.0–100.0)

## 2023-12-23 LAB — TROPONIN I (HIGH SENSITIVITY)
Troponin I (High Sensitivity): 44 ng/L — ABNORMAL HIGH (ref <18)
Troponin I (High Sensitivity): 53 ng/L — ABNORMAL HIGH (ref <18)

## 2023-12-23 MED ORDER — POTASSIUM CHLORIDE CRYS ER 10 MEQ PO TBCR
10.0000 meq | EXTENDED_RELEASE_TABLET | Freq: Every day | ORAL | Status: DC
  Administered 2023-12-24: 10 meq via ORAL
  Filled 2023-12-23: qty 1

## 2023-12-23 MED ORDER — METOPROLOL TARTRATE 25 MG PO TABS
25.0000 mg | ORAL_TABLET | Freq: Two times a day (BID) | ORAL | Status: DC
  Administered 2023-12-23 – 2023-12-29 (×12): 25 mg via ORAL
  Filled 2023-12-23 (×12): qty 1

## 2023-12-23 MED ORDER — MELATONIN 3 MG PO TABS
3.0000 mg | ORAL_TABLET | Freq: Every evening | ORAL | Status: DC | PRN
  Administered 2023-12-23 – 2023-12-28 (×6): 3 mg via ORAL
  Filled 2023-12-23 (×6): qty 1

## 2023-12-23 MED ORDER — ROSUVASTATIN CALCIUM 5 MG PO TABS
10.0000 mg | ORAL_TABLET | Freq: Every day | ORAL | Status: DC
Start: 2023-12-23 — End: 2023-12-29
  Administered 2023-12-23 – 2023-12-28 (×6): 10 mg via ORAL
  Filled 2023-12-23 (×6): qty 2

## 2023-12-23 MED ORDER — ONDANSETRON HCL 4 MG/2ML IJ SOLN: 4.0000 mg | Freq: Four times a day (QID) | INTRAMUSCULAR | Status: DC | PRN

## 2023-12-23 MED ORDER — HEPARIN SODIUM (PORCINE) 5000 UNIT/ML IJ SOLN
5000.0000 [IU] | Freq: Three times a day (TID) | INTRAMUSCULAR | Status: DC
  Administered 2023-12-23 – 2023-12-24 (×2): 5000 [IU] via SUBCUTANEOUS
  Filled 2023-12-23 (×2): qty 1

## 2023-12-23 MED ORDER — FUROSEMIDE 10 MG/ML IJ SOLN
40.0000 mg | Freq: Two times a day (BID) | INTRAMUSCULAR | Status: DC
  Administered 2023-12-23 – 2023-12-24 (×3): 40 mg via INTRAVENOUS
  Filled 2023-12-23 (×3): qty 4

## 2023-12-23 MED ORDER — ASPIRIN 81 MG PO TBEC
81.0000 mg | DELAYED_RELEASE_TABLET | Freq: Every day | ORAL | Status: DC
Start: 2023-12-24 — End: 2023-12-29
  Administered 2023-12-24 – 2023-12-29 (×6): 81 mg via ORAL
  Filled 2023-12-23 (×6): qty 1

## 2023-12-23 MED ORDER — ACETAMINOPHEN 650 MG RE SUPP: 650.0000 mg | Freq: Four times a day (QID) | RECTAL | Status: DC | PRN

## 2023-12-23 MED ORDER — ACETAMINOPHEN 325 MG PO TABS: 650.0000 mg | ORAL_TABLET | Freq: Four times a day (QID) | ORAL | Status: DC | PRN

## 2023-12-23 MED ORDER — ONDANSETRON HCL 4 MG PO TABS: 4.0000 mg | ORAL_TABLET | Freq: Four times a day (QID) | ORAL | Status: DC | PRN

## 2023-12-23 MED ORDER — SENNOSIDES-DOCUSATE SODIUM 8.6-50 MG PO TABS: 1.0000 | ORAL_TABLET | Freq: Every evening | ORAL | Status: DC | PRN

## 2023-12-23 MED ORDER — DILTIAZEM HCL-DEXTROSE 125-5 MG/125ML-% IV SOLN (PREMIX)
5.0000 mg/h | INTRAVENOUS | Status: DC
  Filled 2023-12-23: qty 125

## 2023-12-23 MED ORDER — DILTIAZEM HCL-DEXTROSE 125-5 MG/125ML-% IV SOLN (PREMIX)
5.0000 mg/h | INTRAVENOUS | Status: DC
  Administered 2023-12-23: 5 mg/h via INTRAVENOUS
  Administered 2023-12-24: 15 mg/h via INTRAVENOUS
  Filled 2023-12-23 (×3): qty 125

## 2023-12-23 MED ORDER — PANTOPRAZOLE SODIUM 20 MG PO TBEC
20.0000 mg | DELAYED_RELEASE_TABLET | Freq: Every day | ORAL | Status: DC
Start: 2023-12-24 — End: 2023-12-29
  Administered 2023-12-24 – 2023-12-29 (×6): 20 mg via ORAL
  Filled 2023-12-23 (×6): qty 1

## 2023-12-23 MED ORDER — METOPROLOL TARTRATE 5 MG/5ML IV SOLN: 2.5000 mg | Freq: Once | INTRAVENOUS | Status: DC

## 2023-12-23 MED ORDER — FUROSEMIDE 10 MG/ML IJ SOLN
20.0000 mg | Freq: Once | INTRAMUSCULAR | Status: AC
  Administered 2023-12-23: 20 mg via INTRAVENOUS
  Filled 2023-12-23: qty 2

## 2023-12-23 MED ORDER — SODIUM CHLORIDE 0.9% FLUSH
3.0000 mL | Freq: Two times a day (BID) | INTRAVENOUS | Status: DC
  Administered 2023-12-24 – 2023-12-28 (×10): 3 mL via INTRAVENOUS

## 2023-12-23 NOTE — Consult Note (Signed)
 CARDIOLOGY CONSULT NOTE       Patient ID: MATTELYN IMHOFF MRN: [id number] DOB/AGE: 08-Jul-1934 89 y.o.  Admit date: 12/23/2023 Referring Physician: TEE Fountain Primary Physician: Lorenda Ishihara, MD Primary Cardiologist: Anne Fu Reason for Consultation: Rapid AFib/Volume overload  Active Problems:   * No active hospital problems. *   HPI:  89 y.o. seen in office today by Ronie Spies and advised to go to ER for volume overload, recurrent afib and dyspnea. Unfortuanetly she has widely metastatic breast cancer including liver, ribs/back, lungs and adrenals. She was to get Faslodex injection at [facility] this Friday. She was d/c from hospital 12/09/23 with similar issues volume overload and PAF. Rate control strategy adopted due to age, pancytopenia and metastatic breast cancer. She has been on oral cardizem and lopressor. Has had palpitations , dyspnea and edema for last week worsening. Noted to be in rapid afib rate 130 bpm. I reviewed her CXR and shows bilateral effusions with likely CHF and basilar atelectasis. She has no chest pain. Most recent TTE 11/25/23 showed septal thickness 20 mm with apical hypertrophy no LVOT gradient diastolic parameters indeterminate due to afib , MAC and mild LAE with mild AR There was no pericardial effusion   ROS All other systems reviewed and negative except as noted above  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   Breast cancer (HCC) 2010   RIGHT   CAD (coronary artery disease)    Dysuria    Family history of breast cancer    GERD (gastroesophageal reflux disease)    HX OF   Heart murmur    History of blood transfusion    History of breast cancer ONCOLOGIST-- DR Truett Perna   DX 2010--  RIGHT BREAST DCIS  HIGH GRADE S/P LUMPECTOMY W/ SLN BX--  NO RECURRENCE   History of kidney stones    History of ovarian cancer    2009--  S/P COLON RESECTION FOR PAPILLARY SEROUS CARCINOMA,  BORDERLINE OVARIAN CANCER VERSUS PRIMARY PERITONEAL CARCINOMA WITH PSAMMOMA BODIES--  NO RECURRENCE   History of skin cancer    excision basal cell   Hyperlipidemia    Hypertension    Kidney stones 7/14   Nocturia    Ovarian carcinoma (HCC)    papillary serous-low grade, recurrence found in fat necrosis during ileostomy   Renal stones 2014   Shingles 30 YRS AGO   Ureteral calculi    bilateral    Family History  Problem Relation Age of Onset   Heart disease Mother    Hyperlipidemia Mother    Breast cancer Sister    Cancer Sister        breast    Social History   Socioeconomic History   Marital status: Widowed    Spouse name: Not on file   Number of children: Not on file   Years of education: Not on file   Highest education level: Not on file  Occupational History   Not on file  Tobacco Use   Smoking status: Never   Smokeless tobacco: Never  Vaping Use   Vaping status: Never Used  Substance and Sexual Activity   Alcohol use: No   Drug use: No   Sexual activity: Never    Partners: Male    Birth control/protection: Surgical    Comment: Hysterectomy  Other Topics Concern   Not on file  Social History Narrative   Not on file  Social Drivers of Corporate investment banker Strain: Not on file  Food Insecurity: No Food Insecurity (12/05/2023)   Hunger Vital Sign    Worried About Running Out of Food in the Last Year: Never true    Ran Out of Food in the Last Year: Never true  Transportation Needs: No Transportation Needs (12/05/2023)   PRAPARE - Administrator, Civil Service (Medical): No    Lack of Transportation (Non-Medical): No  Physical Activity: Not on file  Stress: Not on file  Social Connections: Moderately Integrated (12/06/2023)   Social Connection and Isolation Panel [NHANES]    Frequency of Communication with Friends and Family: More than three times a week    Frequency of Social Gatherings with Friends and Family: More than three times a  week    Attends Religious Services: More than 4 times per year    Active Member of [employer] or Organizations: Yes    Attends Banker Meetings: More than 4 times per year    Marital Status: Widowed  Intimate Partner Violence: Not At Risk (12/05/2023)   Humiliation, Afraid, Rape, and Kick questionnaire    Fear of Current or Ex-Partner: No    Emotionally Abused: No    Physically Abused: No    Sexually Abused: No    Past Surgical History:  Procedure Laterality Date   APPENDECTOMY     BREAST LUMPECTOMY Right 04-13-2009   W/ SLN BX   BREAST LUMPECTOMY WITH RADIOACTIVE SEED LOCALIZATION Left 06/11/2019   Procedure: LEFT BREAST LUMPECTOMY WITH RADIOACTIVE SEED LOCALIZATION;  Surgeon: Griselda Miner, MD;  Location: [facility];  Service: General;  Laterality: Left;   CARDIAC CATHETERIZATION  11-11-2005 DR Verdis Prime   MODERATE LAD DISEASE/ NORMAL LVF/ EF 65-75%   CATARACT EXTRACTION W/ INTRAOCULAR LENS IMPLANT Bilateral 04/26/2013   CHOLECYSTECTOMY  1990   OPEN   COLONOSCOPY     CYSTOSCOPY W/ URETERAL STENT PLACEMENT Bilateral 05/07/2013   Procedure: CYSTOSCOPY WITH STENT REPLACEMENTS;  Surgeon: Sebastian Ache, MD;  Location: [facility];  Service: Urology;  Laterality: Bilateral;   CYSTOSCOPY WITH BIOPSY N/A 04/07/2013   Procedure: CYSTOSCOPY WITH BIOPSY and fulgeration;  Surgeon: Sebastian Ache, MD;  Location: [facility];  Service: Urology;  Laterality: N/A;   CYSTOSCOPY WITH RETROGRADE PYELOGRAM, URETEROSCOPY AND STENT PLACEMENT Bilateral 05/07/2013   Procedure: CYSTOSCOPY WITH RETROGRADE PYELOGRAM, URETEROSCOPY ;  Surgeon: Sebastian Ache, MD;  Location[facility];  Service: Urology;  Laterality: Bilateral;   CYSTOSCOPY WITH RETROGRADE PYELOGRAM, URETEROSCOPY AND STENT PLACEMENT Bilateral 01/06/2019   Procedure: CYSTOSCOPY WITH RETROGRADE PYELOGRAM, URETEROSCOPY AND STENT PLACEMENT, FIRST STAGE;  Surgeon: Sebastian Ache, MD;  Location: [facility];  Service: Urology;   Laterality: Bilateral;   CYSTOSCOPY WITH RETROGRADE PYELOGRAM, URETEROSCOPY AND STENT PLACEMENT Bilateral 01/27/2019   Procedure: CYSTOSCOPY WITH RETROGRADE PYELOGRAM, URETEROSCOPY AND STENT PLACEMENT;  Surgeon: Sebastian Ache, MD;  Location: [facility];  Service: Urology;  Laterality: Bilateral;  75 MINS   CYSTOSCOPY WITH STENT PLACEMENT Bilateral 04/07/2013   Procedure: CYSTOSCOPY WITH STENT PLACEMENT;  Surgeon: Sebastian Ache, MD;  Location: [facility];  Service: Urology;  Laterality: Bilateral;   DX LAPAROSCOPY W/ PERITONEAL AND OMENTAL BX'S AND WASHINGS  02-16-2008   EXCISION RIGHT BREAST MASS  02-10-2002   EXP. LAP. EXTENSIVE ADHESIOLYSIS/ RESECTION TERMINAL ILEUM , ASCENDING AND DESCENDING COLON WITH CREATION ILEOSTOMY AND MUCOUS FISTULA  02-19-2008   PERFERATION AND ABD. CANCER--  TAKEDOWN ILEOSTOMY 08-23-2008   HOLMIUM LASER APPLICATION Bilateral 01/06/2019  Procedure: HOLMIUM LASER APPLICATION;  Surgeon: Sebastian Ache, MD;  Location: [facility];  Service: Urology;  Laterality: Bilateral;   HOLMIUM LASER APPLICATION Bilateral 01/27/2019   Procedure: HOLMIUM LASER APPLICATION;  Surgeon: Sebastian Ache, MD;  Location: [facility];  Service: Urology;  Laterality: Bilateral;   I & D EXTREMITY Right 02/02/2022   Procedure: IRRIGATION AND DEBRIDEMENT HAND;  Surgeon: Bradly Bienenstock, MD;  [facility] OR;  Service: Orthopedics;  Laterality: Right;   ILEOSTOMY CLOSURE  08/2008   TOTAL ABDOMINAL HYSTERECTOMY W/ BILATERAL SALPINGOOPHORECTOMY  1976   TOTAL ABDOMINAL HYSTERECTOMY W/ BILATERAL SALPINGOOPHORECTOMY  1976   VULVAR LESION REMOVAL N/A 08/22/2014   Procedure: EXCISION OF VULVAR CYST  ;  Surgeon: Annamaria Boots, MD;  Location: [facility];  Service: Gynecology;  Laterality: N/A;   WOUND DEBRIDEMENT  05/28/2012   Procedure: DEBRIDEMENT ABDOMINAL WOUND;  Surgeon: Currie Paris, MD;  Location:  SUR[facility]ERY CENTER;  Service: General;  Laterality: N/A;  excision chronic wound abdominal wall       Current Facility-Administered Medications:    diltiazem (CARDIZEM) 125 mg in dextrose 5% 125 mL (1 mg/mL) infusion, 5-15 mg/hr, Intravenous, Continuous, Durwin Glaze, MD, Last Rate: 5 mL/hr at 12/23/23 1723, 5 mg/hr at 12/23/23 1723  Current Outpatient Medications:    aspirin EC 81 MG tablet, Take 81 mg by mouth daily., Disp: , Rfl:    BELSOMRA 10 MG TABS, Take 10 mg by mouth at bedtime., Disp: , Rfl:    Calcium Carbonate-Vitamin D (CALTRATE 600+D PO), Take 1 tablet by mouth 2 (two) times daily., Disp: , Rfl:    Cholecalciferol (VITAMIN D3) 50 MCG (2000 UT) TABS, Take 2,000 Units by mouth in the morning and at bedtime., Disp: , Rfl:    cyanocobalamin (VITAMIN B12) 1000 MCG tablet, Take 1,000 mcg by mouth daily., Disp: , Rfl:    diltiazem (CARDIZEM CD) 240 MG 24 hr capsule, Take 1 capsule (240 mg total) by mouth daily., Disp: , Rfl:    feeding supplement (ENSURE ENLIVE / ENSURE PLUS) LIQD, Take 237 mLs by mouth 2 (two) times daily between meals., Disp: , Rfl:    hydroxychloroquine (PLAQUENIL) 200 MG tablet, Take 200 mg by mouth daily., Disp: , Rfl:    hydroxyurea (HYDREA) 500 MG capsule, TAKE 1 CAPSULE BY MOUTH EVERY MONDAY, WEDNESDAY, AND FRIDAY. MAY TAKE WITH FOOD TO MINIMIZE GI SIDE EFFECTS (Patient taking differently: Take 500 mg by mouth every Monday, Wednesday, and Friday. May take with food to minimize GI side effects.), Disp: 36 capsule, Rfl: 0   isosorbide mononitrate (IMDUR) 60 MG 24 hr tablet, Take 1 tablet (60 mg total) by mouth daily., Disp: 90 tablet, Rfl: 2   loperamide (IMODIUM) 2 MG capsule, Take 1 capsule (2 mg total) by mouth as needed for diarrhea or loose stools., Disp: , Rfl:    metoprolol tartrate (LOPRESSOR) 25 MG tablet, Take 1 tablet (25 mg total) by mouth 2 (two) times daily., Disp: , Rfl:    Multiple Vitamins-Minerals (MULTIVITAMIN WOMEN 50+) TABS, Take 1 tablet by mouth daily with breakfast., Disp: , Rfl:    pantoprazole (PROTONIX) 20 MG tablet, Take 1 tablet (20  mg total) by mouth daily., Disp: , Rfl:    potassium chloride (KLOR-CON M) 10 MEQ tablet, Take 1 tablet (10 meq) twice daily for 3 days, then 1 tablet daily. (Patient taking differently: Take 10 mEq by mouth 2 (two) times daily.), Disp: 60 tablet, Rfl: 1   PROCTOZONE-HC 2.5 % rectal cream, PLACE 1  APPLICATION RECTALLY 2 TIMES A DAY AS NEEDED FOR HEMORRHOIDS. (Patient taking differently: Place 1 application  rectally 2 (two) times daily as needed for hemorrhoids.), Disp: 60 g, Rfl: 11   rosuvastatin (CRESTOR) 10 MG tablet, Take 10 mg by mouth at bedtime., Disp: , Rfl:    sodium bicarbonate 650 MG tablet, Take 1 tablet (650 mg total) by mouth 2 (two) times daily., Disp: , Rfl:    sucralfate (CARAFATE) 1 GM/10ML suspension, Take 10 mLs (1 g total) by mouth 4 (four) times daily -  with meals and at bedtime. (Patient taking differently: Take 1 g by mouth with breakfast, with lunch, and with evening meal.), Disp: , Rfl:    tiZANidine (ZANAFLEX) 4 MG tablet, Take 4 mg by mouth 2 (two) times daily as needed for muscle spasms., Disp: , Rfl:    triamcinolone (KENALOG) 0.1 %, Apply 1 application  topically as needed (for rashes- affected areas)., Disp: , Rfl:    Wound Dressings (SONAFINE), Apply 1 Application topically 2 (two) times daily as needed (Apply to areas of redness from radiation as needed. Call Dr. Colletta Maryland nurse at [phone] if you have any questions)., Disp: , Rfl:    diltiazem (CARDIZEM) infusion 5 mg/hr (12/23/23 1723)    Physical Exam: Blood pressure (!) 178/82, pulse (!) 125, temperature 97.7 F (36.5 C), resp. rate 19, height 5\' 6"  (1.676 m), weight 52.2 kg, last menstrual period 10/07/1974, SpO2 100%.    Frail elderly female Tachypnea Bilateral rhonchi/rales Short systolic ejection murmur Abdomen benign Bilateral edema to mid thigh  Labs:   Lab Results  Component Value Date   WBC 12.6 (H) 12/23/2023   HGB 7.7 (L) 12/23/2023   HCT 24.4 (L) 12/23/2023   MCV 104.3 (H)  12/23/2023   PLT 197 12/23/2023   No results for input(s): "NA", "K", "CL", "CO2", "BUN", "CREATININE", "CALCIUM", "PROT", "BILITOT", "ALKPHOS", "ALT", "AST", "GLUCOSE" in the last 168 hours.  Invalid input(s): "LABALBU" Lab Results  Component Value Date   CKTOTAL 355 NO VISIBLE HEMOLYSIS (H) 04/22/2008   CKMB 5.9 (H) 04/22/2008   TROPONINI 0.01        NO INDICATION OF MYOCARDIAL INJURY. 02/19/2008    Lab Results  Component Value Date   CHOL  02/29/2008    61        ATP III CLASSIFICATION:  <200     mg/dL   Desirable  517-616  mg/dL   Borderline High  >=073    mg/dL   High   CHOL  71/03/2693    76        ATP III CLASSIFICATION:  <200     mg/dL   Desirable  854-627  mg/dL   Borderline High  >=035    mg/dL   High   CHOL  00/93/8182    52        ATP III CLASSIFICATION:  <200     mg/dL   Desirable  993-716  mg/dL   Borderline High  >=967    mg/dL   High   No results found for: "HDL" No results found for: "LDLCALC" Lab Results  Component Value Date   TRIG 110 02/29/2008   TRIG 203 (H) 02/22/2008   TRIG 148 02/21/2008   No results found for: "CHOLHDL" No results found for: "LDLDIRECT"    Radiology: [facility] Chest Port 1 View Result Date: 12/05/2023 CLINICAL DATA:  Shortness of breath EXAM: PORTABLE CHEST 1 VIEW COMPARISON:  Chest radiograph dated 04/07/2013 FINDINGS: Low lung volumes with bronchovascular  crowding. Bibasilar patchy opacities and dense left retrocardiac opacity. Blunting of the bilateral costophrenic angles. No pneumothorax. The heart size and mediastinal contours are within normal limits. No acute osseous abnormality. IMPRESSION: 1. Low lung volumes with bronchovascular crowding. Bibasilar patchy opacities and dense left retrocardiac opacity, which may represent atelectasis, aspiration, or pneumonia. 2. Blunting of the bilateral costophrenic angles, which may represent small pleural effusions. Electronically Signed   By: Agustin Cree M.D.   On: 12/05/2023 16:33   DG  ESOPHAGUS W SINGLE CM (SOL OR THIN BA) Result Date: 12/01/2023 CLINICAL DATA:  Dysphagia, pain during swallowing. EXAM: ESOPHOGRAM/BARIUM SWALLOW TECHNIQUE: Single contrast examination was performed using  thin barium. FLUOROSCOPY: Radiation Exposure Index (as provided by the fluoroscopic device): 7.0 mGy Kerma COMPARISON:  None Available. FINDINGS: Limited assessment of swallowing due to kyphosis. Poor esophageal motility with stasis of contrast in the esophagus. Gastroesophageal junction does expand somewhat during swallowing but a 13 mm barium tablet would not pass into the stomach. IMPRESSION: 1. Slight narrowing at the gastroesophageal junction, through which a 13 mm barium tablet would not pass. 2. Esophageal dysmotility. Electronically Signed   By: Leanna Battles M.D.   On: 12/01/2023 09:00   ECHOCARDIOGRAM COMPLETE Result Date: 11/25/2023    ECHOCARDIOGRAM REPORT   Patient Name:   LAVAUGHN BISIG Date of Exam: 11/25/2023 Medical Rec #:  [id number]        Height:       66.0 in Accession #:    [id number]       Weight:       117.9 lb Date of Birth:  [birth year]       BSA:          1.598 m Patient Age:    89 years         BP:           144/56 mmHg Patient Gender: F                HR:           88 bpm. Exam Location:  Inpatient Procedure: 2D Echo, Cardiac Doppler and Color Doppler (Both Spectral and Color            Flow Doppler were utilized during procedure). Indications:    Chest Pain R07.9  History:        Patient has prior history of Echocardiogram examinations, most                 recent 03/22/2022. CAD; Risk Factors:Hypertension.  Sonographer:    Webb Laws Referring Phys: [id number] SUNIT TOLIA IMPRESSIONS  1. Severe basal septal hypertrophy on off axis imaging; maximal septal thickness 20 mm. Maximal apical hypertrophy 20 mm. No resting gradient noted. Left ventricular ejection fraction, by estimation, is 65 to 70%. The left ventricle has normal function.  The left ventricle has no regional wall  motion abnormalities. There is severe asymmetric left ventricular hypertrophy of the basal-septal segment. Left ventricular diastolic function could not be evaluated.  2. Right ventricular systolic function is normal. The right ventricular size is normal.  3. Left atrial size was mildly dilated.  4. The mitral valve is normal in structure. No evidence of mitral valve regurgitation. No evidence of mitral stenosis. The mean mitral valve gradient is 2.0 mmHg. Moderate mitral annular calcification.  5. The aortic valve is tricuspid. There is mild calcification of the aortic valve. Aortic valve regurgitation is mild. No aortic stenosis is present.  6. The inferior vena cava  is normal in size with greater than 50% respiratory variability, suggesting right atrial pressure of 3 mmHg. Comparison(s): Prior images reviewed side by side. Aortic regurgitation not seen in 2023 study. Maximal basal septal thickness 14 mm on better quality long axis view. Apex not well seen in 2023 study. Conclusion(s)/Recommendation(s): Consider outpatient CMR for clarification of hypertrophy if clinically indicated. FINDINGS  Left Ventricle: Severe basal septal hypertrophy on off axis imaging; maximal septal thickness 20 mm. Maximal apical hypertrophy 20 mm. No resting gradient noted. Left ventricular ejection fraction, by estimation, is 65 to 70%. The left ventricle has normal function. The left ventricle has no regional wall motion abnormalities. Global longitudinal strain performed but not reported based on interpreter judgement due to suboptimal tracking. The left ventricular internal cavity size was normal in size. There is severe asymmetric left ventricular hypertrophy of the basal-septal segment. Left ventricular diastolic function could not be evaluated due to mitral annular calcification (moderate or greater). Left ventricular diastolic function could not be evaluated. Right Ventricle: The right ventricular size is normal. No increase in  right ventricular wall thickness. Right ventricular systolic function is normal. Left Atrium: Left atrial size was mildly dilated. Right Atrium: Right atrial size was normal in size. Pericardium: There is no evidence of pericardial effusion. Mitral Valve: The mitral valve is normal in structure. Moderate mitral annular calcification. No evidence of mitral valve regurgitation. No evidence of mitral valve stenosis. The mean mitral valve gradient is 2.0 mmHg. Tricuspid Valve: The tricuspid valve is normal in structure. Tricuspid valve regurgitation is mild . No evidence of tricuspid stenosis. Aortic Valve: The aortic valve is tricuspid. There is mild calcification of the aortic valve. Aortic valve regurgitation is mild. No aortic stenosis is present. Pulmonic Valve: The pulmonic valve was not well visualized. Pulmonic valve regurgitation is not visualized. No evidence of pulmonic stenosis. Aorta: The aortic root and ascending aorta are structurally normal, with no evidence of dilitation. Venous: The inferior vena cava is normal in size with greater than 50% respiratory variability, suggesting right atrial pressure of 3 mmHg. IAS/Shunts: The atrial septum is grossly normal. Additional Comments: 3D imaging was not performed.  LEFT VENTRICLE PLAX 2D LVIDd:         4.00 cm   Diastology LVIDs:         3.20 cm   LV e' medial:    5.11 cm/s LV PW:         1.25 cm   LV E/e' medial:  21.7 LV IVS:        1.10 cm   LV e' lateral:   9.03 cm/s LVOT diam:     1.70 cm   LV E/e' lateral: 12.3 LV SV:         51 LV SV Index:   32 LVOT Area:     2.27 cm  RIGHT VENTRICLE             IVC RV Basal diam:  3.00 cm     IVC diam: 1.40 cm RV S prime:     11.60 cm/s TAPSE (M-mode): 1.5 cm LEFT ATRIUM             Index        RIGHT ATRIUM          Index LA diam:        4.50 cm 2.82 cm/m   RA Area:     7.16 cm LA Vol (A2C):   54.4 ml 34.05 ml/m  RA Volume:  13.10 ml 8.20 ml/m LA Vol (A4C):   47.2 ml 29.54 ml/m LA Biplane Vol: 54.4 ml 34.05  ml/m  AORTIC VALVE LVOT Vmax:   145.50 cm/s LVOT Vmean:  89.250 cm/s LVOT VTI:    0.224 m  AORTA Ao Root diam: 2.70 cm Ao Asc diam:  3.10 cm MITRAL VALVE                TRICUSPID VALVE MV Area (PHT): 4.15 cm     TR Peak grad:   25.0 mmHg MV Mean grad:  2.0 mmHg     TR Vmax:        250.00 cm/s MV Decel Time: 183 msec MV E velocity: 111.00 cm/s  SHUNTS                             Systemic VTI:  0.22 m                             Systemic Diam: 1.70 cm Riley Lam MD Electronically signed by Riley Lam MD Signature Date/Time: 11/25/2023/9:28:52 AM    Final     EKG: afib nonspecific ST changes rate 140   ASSESSMENT AND PLAN:   PAF:  Rx with iv cardizem and home dose of lopressor No anticoagulation due to age, metastatic breast cancer and pancytopenia.  Volume Overload:  ? Diastolic dysfunction with apical hypertrophy and severe LVH. EF normal HFpEF. Also concern for metastatic lung dx with malignant effusion.  Her albumin is low at 2.8 Rx with iv lasix 40 mg bid. Repeat TTE concern that murmur sounds like rub and r/o pericardial effusion. LE edema may represent lymphatic edema with widely metastatic dx.  Breast Cancer:  Sees Dr Truett Perna and was to have Faslodex Rx Friday prognosis is poor. Discussed with daughter Larita Fife and patient desires to be DNR  Disposition: she has been in [facility] last 2 weeks May not be able to return to independent living at home in [place] Daughter lives in [place]. Would consider palliative consult   Signed: Charlton Haws 12/23/2023, 5:24 PM

## 2023-12-23 NOTE — H&P (Signed)
 History and Physical    [name] FAO:[id number] DOB: 12/19/1933 DOA: 12/23/2023  PCP: Lorenda Ishihara, MD  Patient coming from: SNF I have personally briefly reviewed patient's old medical records in [facility]  Chief Complaint: Shortness of breath  HPI: [name] is a 89 y.o. female with medical history significant for stage IV breast cancer (widely metastatic to lung, liver, spine, ribs, peritoneum), PAF not on AC, chronic HFpEF, HTN, CKD stage IIIb, anemia, HTN who presented to the ED for evaluation of shortness of breath.  Patient reports several days of progressive lower extremity swelling and shortness of breath.  Dyspnea is worse with minimal exertion but also occurring at rest.  She has had orthopnea.  She is not on a diuretic.  She does report fair urine output at baseline.  She was recently admitted 2/28-3/4 for A-fib with RVR.  Rate control strategy was pursued and patient has been on diltiazem and Lopressor.  She was seen in cardiology clinic earlier today for shortness of breath and noted to be in A-fib with RVR.  She was subsequently sent to the ED for further evaluation.  ED Course  Labs/Imaging on admission: I have personally reviewed following labs and imaging studies.  Initial vitals showed BP 164/95, pulse 117, RR 18, temp 97.7 F, SpO2 100% on room air.  Labs showed troponin 44 > 53, WBC 12.6, hemoglobin 7.7, platelets 197,000, sodium 143, potassium 4.4, bicarb 23, BUN 40, creatinine 1.21, serum glucose 172.  2 view chest x-ray showed mild cardiomegaly, pulmonary edema, moderate bilateral pleural effusions, bibasilar atelectasis.  Patient was given IV Lasix 20 mg and started on IV diltiazem infusion.  Cardiology consulted and recommended medical admission.  The hospitalist service was consulted to admit.  Review of Systems: All systems reviewed and are negative except as documented in history of present illness above.   Past Medical  History:  Diagnosis Date   Arthritis    HANDS AND BACK   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   Breast cancer (HCC) 2010   RIGHT   CAD (coronary artery disease)    Dysuria    Family history of breast cancer    GERD (gastroesophageal reflux disease)    HX OF   Heart murmur    History of blood transfusion    History of breast cancer ONCOLOGIST-- DR Truett Perna   DX 2010--  RIGHT BREAST DCIS  HIGH GRADE S/P LUMPECTOMY W/ SLN BX--  NO RECURRENCE   History of kidney stones    History of ovarian cancer    2009--  S/P COLON RESECTION FOR PAPILLARY SEROUS CARCINOMA, BORDERLINE OVARIAN CANCER VERSUS PRIMARY PERITONEAL CARCINOMA WITH PSAMMOMA BODIES--  NO RECURRENCE   History of skin cancer    excision basal cell   Hyperlipidemia    Hypertension    Kidney stones 7/14   Nocturia    Ovarian carcinoma (HCC)    papillary serous-low grade, recurrence found in fat necrosis during ileostomy   Renal stones 2014   Shingles 30 YRS AGO   Ureteral calculi    bilateral    Past Surgical History:  Procedure Laterality Date   APPENDECTOMY     BREAST LUMPECTOMY Right 04-13-2009   W/ SLN BX   BREAST LUMPECTOMY WITH RADIOACTIVE SEED LOCALIZATION Left 06/11/2019   Procedure: LEFT BREAST LUMPECTOMY WITH RADIOACTIVE SEED LOCALIZATION;  Surgeon: Griselda Miner, MD;  Location: [facility];  Service: General;  Laterality: Left;   CARDIAC CATHETERIZATION  11-11-2005 DR Verdis Prime  MODERATE LAD DISEASE/ NORMAL LVF/ EF 65-75%   CATARACT EXTRACTION W/ INTRAOCULAR LENS IMPLANT Bilateral 04/26/2013   CHOLECYSTECTOMY  1990   OPEN   COLONOSCOPY     CYSTOSCOPY W/ URETERAL STENT PLACEMENT Bilateral 05/07/2013   Procedure: CYSTOSCOPY WITH STENT REPLACEMENTS;  Surgeon: Sebastian Ache, MD;  Location: [facility];  Service: Urology;  Laterality: Bilateral;   CYSTOSCOPY WITH BIOPSY N/A 04/07/2013   Procedure: CYSTOSCOPY WITH BIOPSY and fulgeration;  Surgeon: Sebastian Ache, MD;  Location: [facility];  Service:  Urology;  Laterality: N/A;   CYSTOSCOPY WITH RETROGRADE PYELOGRAM, URETEROSCOPY AND STENT PLACEMENT Bilateral 05/07/2013   Procedure: CYSTOSCOPY WITH RETROGRADE PYELOGRAM, URETEROSCOPY ;  Surgeon: Sebastian Ache, MD;  Location: [facility];  Service: Urology;  Laterality: Bilateral;   CYSTOSCOPY WITH RETROGRADE PYELOGRAM, URETEROSCOPY AND STENT PLACEMENT Bilateral 01/06/2019   Procedure: CYSTOSCOPY WITH RETROGRADE PYELOGRAM, URETEROSCOPY AND STENT PLACEMENT, FIRST STAGE;  Surgeon: Sebastian Ache, MD;  Location: [facility];  Service: Urology;  Laterality: Bilateral;   CYSTOSCOPY WITH RETROGRADE PYELOGRAM, URETEROSCOPY AND STENT PLACEMENT Bilateral 01/27/2019   Procedure: CYSTOSCOPY WITH RETROGRADE PYELOGRAM, URETEROSCOPY AND STENT PLACEMENT;  Surgeon: Sebastian Ache, MD;  Location: [facility];  Service: Urology;  Laterality: Bilateral;  75 MINS   CYSTOSCOPY WITH STENT PLACEMENT Bilateral 04/07/2013   Procedure: CYSTOSCOPY WITH STENT PLACEMENT;  Surgeon: Sebastian Ache, MD;  Location: [facility];  Service: Urology;  Laterality: Bilateral;   DX LAPAROSCOPY W/ PERITONEAL AND OMENTAL BX'S AND WASHINGS  02-16-2008   EXCISION RIGHT BREAST MASS  02-10-2002   EXP. LAP. EXTENSIVE ADHESIOLYSIS/ RESECTION TERMINAL ILEUM , ASCENDING AND DESCENDING COLON WITH CREATION ILEOSTOMY AND MUCOUS FISTULA  02-19-2008   PERFERATION AND ABD. CANCER--  TAKEDOWN ILEOSTOMY 08-23-2008   HOLMIUM LASER APPLICATION Bilateral 01/06/2019   Procedure: HOLMIUM LASER APPLICATION;  Surgeon: Sebastian Ache, MD;  Location: [facility];  Service: Urology;  Laterality: Bilateral;   HOLMIUM LASER APPLICATION Bilateral 01/27/2019   Procedure: HOLMIUM LASER APPLICATION;  Surgeon: Sebastian Ache, MD;  Location: [facility];  Service: Urology;  Laterality: Bilateral;   I & D EXTREMITY Right 02/02/2022   Procedure: IRRIGATION AND DEBRIDEMENT HAND;  Surgeon: Bradly Bienenstock, MD;  Location[facility] OR;  Service: Orthopedics;  Laterality: Right;   ILEOSTOMY CLOSURE   08/2008   TOTAL ABDOMINAL HYSTERECTOMY W/ BILATERAL SALPINGOOPHORECTOMY  1976   TOTAL ABDOMINAL HYSTERECTOMY W/ BILATERAL SALPINGOOPHORECTOMY  1976   VULVAR LESION REMOVAL N/A 08/22/2014   Procedure: EXCISION OF VULVAR CYST  ;  Surgeon: Annamaria Boots, MD;  Location: [facility];  Service: Gynecology;  Laterality: N/A;   WOUND DEBRIDEMENT  05/28/2012   Procedure: DEBRIDEMENT ABDOMINAL WOUND;  Surgeon: Currie Paris, MD;  Location: [facility];  Service: General;  Laterality: N/A;  excision chronic wound abdominal wall    Social History: Social History   Tobacco Use   Smoking status: Never   Smokeless tobacco: Never  Vaping Use   Vaping status: Never Used  Substance Use Topics   Alcohol use: No   Drug use: No    Allergies  Allergen Reactions   Ramipril Itching    Family History  Problem Relation Age of Onset   Heart disease Mother    Hyperlipidemia Mother    Breast cancer Sister    Cancer Sister        breast     Prior to Admission medications   Medication Sig Start Date End Date Taking? Authorizing Provider  aspirin EC 81 MG tablet Take 81 mg  by mouth daily.    [provider]  BELSOMRA 10 MG TABS Take 10 mg by mouth at bedtime. 01/20/19   [provider]  Calcium Carbonate-Vitamin D (CALTRATE 600+D PO) Take 1 tablet by mouth 2 (two) times daily.    [provider]  Cholecalciferol (VITAMIN D3) 50 MCG (2000 UT) TABS Take 2,000 Units by mouth in the morning and at bedtime.    [provider]  cyanocobalamin (VITAMIN B12) 1000 MCG tablet Take 1,000 mcg by mouth daily.    [provider]  diltiazem (CARDIZEM CD) 240 MG 24 hr capsule Take 1 capsule (240 mg total) by mouth daily. 12/09/23   Almon Hercules, MD  feeding supplement (ENSURE ENLIVE / ENSURE PLUS) LIQD Take 237 mLs by mouth 2 (two) times daily between meals. 12/08/23   Almon Hercules, MD  hydroxychloroquine (PLAQUENIL) 200 MG tablet Take 200 mg by mouth daily.  11/08/21   [provider]  hydroxyurea (HYDREA) 500 MG capsule TAKE 1 CAPSULE BY MOUTH EVERY MONDAY, WEDNESDAY, AND FRIDAY. MAY TAKE WITH FOOD TO MINIMIZE GI SIDE EFFECTS Patient taking differently: Take 500 mg by mouth every Monday, Wednesday, and Friday. May take with food to minimize GI side effects. 10/23/23   Ladene Artist, MD  isosorbide mononitrate (IMDUR) 60 MG 24 hr tablet Take 1 tablet (60 mg total) by mouth daily. 05/16/23   Jake Bathe, MD  loperamide (IMODIUM) 2 MG capsule Take 1 capsule (2 mg total) by mouth as needed for diarrhea or loose stools. 12/04/23   Briant Cedar, MD  metoprolol tartrate (LOPRESSOR) 25 MG tablet Take 1 tablet (25 mg total) by mouth 2 (two) times daily. 12/09/23   Rai, Delene Ruffini, MD  Multiple Vitamins-Minerals (MULTIVITAMIN WOMEN 50+) TABS Take 1 tablet by mouth daily with breakfast.    [provider]  pantoprazole (PROTONIX) 20 MG tablet Take 1 tablet (20 mg total) by mouth daily. 12/05/23   Briant Cedar, MD  potassium chloride (KLOR-CON M) 10 MEQ tablet Take 1 tablet (10 meq) twice daily for 3 days, then 1 tablet daily. Patient taking differently: Take 10 mEq by mouth 2 (two) times daily. 11/07/23   Rana Snare, NP  PROCTOZONE-HC 2.5 % rectal cream PLACE 1 APPLICATION RECTALLY 2 TIMES A DAY AS NEEDED FOR HEMORRHOIDS. Patient taking differently: Place 1 application  rectally 2 (two) times daily as needed for hemorrhoids. 01/09/15   Cross, Efraim Kaufmann D, NP  rosuvastatin (CRESTOR) 10 MG tablet Take 10 mg by mouth at bedtime.    [provider]  sodium bicarbonate 650 MG tablet Take 1 tablet (650 mg total) by mouth 2 (two) times daily. 12/08/23   Almon Hercules, MD  sucralfate (CARAFATE) 1 GM/10ML suspension Take 10 mLs (1 g total) by mouth 4 (four) times daily -  with meals and at bedtime. Patient taking differently: Take 1 g by mouth with breakfast, with lunch, and with evening meal. 12/04/23   Briant Cedar, MD   tiZANidine (ZANAFLEX) 4 MG tablet Take 4 mg by mouth 2 (two) times daily as needed for muscle spasms. 10/10/23   [provider]  triamcinolone (KENALOG) 0.1 % Apply 1 application  topically as needed (for rashes- affected areas). 04/04/20   [provider]  Wound Dressings (SONAFINE) Apply 1 Application topically 2 (two) times daily as needed (Apply to areas of redness from radiation as needed. Call Dr. Colletta Maryland nurse at [phone] if you have any questions). 12/04/23  Briant Cedar, MD    Physical Exam: Vitals:   12/23/23 1915 12/23/23 1930 12/23/23 1945 12/23/23 2000  BP: (!) 151/84 (!) 154/76 (!) 162/66 (!) 149/68  Pulse: (!) 108 (!) 107 94 (!) 106  Resp: 19 20 (!) 21 19  Temp:      SpO2: 100% 100% 100% 100%  Weight:      Height:       Constitutional: Frail elderly woman resting in bed with head elevated.  NAD, calm, comfortable Eyes: EOMI, lids and conjunctivae normal ENMT: Mucous membranes are moist. Posterior pharynx clear of any exudate or lesions.Normal dentition.  Neck: normal, supple, no masses. Respiratory: Diminished breath sounds bilateral lung bases with inspiratory crackles. Normal respiratory effort. No accessory muscle use.  Cardiovascular: Irregularly irregular with tachycardia, no murmurs / rubs / gallops.  +3 bilateral lower extremity edema. 2+ pedal pulses. Abdomen: no tenderness, no masses palpated. Musculoskeletal: no clubbing / cyanosis. No joint deformity upper and lower extremities. Good ROM, no contractures. Normal muscle tone.  Skin: no rashes, lesions, ulcers. No induration Neurologic: Sensation intact. Strength 5/5 in all 4.  Psychiatric: Normal judgment and insight. Alert and oriented x 3. Normal mood.   EKG: Personally reviewed.  A-fib with RVR, rate 140.  Rate is faster when compared to previous.  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 female with medical history significant for stage IV breast cancer (widely metastatic to lung, liver, spine, ribs, peritoneum), PAF not on AC, chronic HFpEF, HTN, CKD stage IIIb, anemia, HTN who is admitted with A-fib with RVR and acute on chronic HFpEF.  Assessment and Plan: Paroxysmal atrial fibrillation with RVR: Rate in 140s on arrival.  Per cardiology, not on anticoagulation due to comorbidities including metastatic disease and chronic anemia. -Continue IV diltiazem infusion -Continue home Lopressor 25 mg twice daily -Continue aspirin 81 mg  Acute on chronic HFpEF: Significantly volume overloaded on admission.  CXR with bilateral pleural effusions and pulmonary edema.  Recent TTE 2/18 with EF 65-70%, severe asymmetric LVH of basal-septal segment.  Pleural effusions and peripheral edema could also be from her known metastatic disease. -Cardiology following -Continue IV Lasix 40 mg twice daily -Continue Lopressor 25 mg twice daily -Repeat echocardiogram per cardiology  Anemia of chronic disease: Hemoglobin 7.7 on admission.  Patient denies any obvious bleeding.  Will check anemia panel.  Metastatic breast cancer: Widely metastatic breast cancer, follows with Dr. Truett Perna.  Current treatment is with Faslodex.  CKD stage IIIb: Renal function is stable.  Monitor with diuresis.  Hypertension: Continue Lopressor.  On IV diltiazem as above.   DVT prophylaxis: heparin injection 5,000 Units Start: 12/23/23 2200 Code Status:   Code Status: Limited: Do not attempt resuscitation (DNR) -DNR-LIMITED -Do Not Intubate/DNI confirmed with patient on admission.  Daughter at bedside. Family Communication: Daughter at bedside Disposition Plan: From SNF, dispo pending clinical  progress Consults called: Cardiology Severity of Illness: The appropriate patient status for this patient is INPATIENT. Inpatient status is judged to be reasonable and necessary in order to provide the required intensity of service to ensure the patient's safety. The patient's presenting symptoms, physical exam findings, and initial radiographic and laboratory data in the  context of their chronic comorbidities is felt to place them at high risk for further clinical deterioration. Furthermore, it is not anticipated that the patient will be medically stable for discharge from the hospital within 2 midnights of admission.   * I certify that at the point of admission it is my clinical judgment that the patient will require inpatient hospital care spanning beyond 2 midnights from the point of admission due to high intensity of service, high risk for further deterioration and high frequency of surveillance required.Darreld Mclean MD [facility]  If 7PM-7AM, please contact night-coverage [email or web address]  12/23/2023, 8:18 PM

## 2023-12-23 NOTE — ED Provider Notes (Signed)
 [place] EMERGENCY DEPARTMENT AT [facility] Provider Note   CSN: [id number] Arrival date & time: 12/23/23  1522     History  Chief Complaint  Patient presents with   Afib RVR   Shortness of Breath    [name] is a 89 y.o. female.  89 year old female with past medical history of diastolic CHF and atrial fibrillation presenting to the emergency department today with shortness of breath over the past few days.  She apparently went to follow-up with her cardiologist and was sent for further evaluation.  The patient received diltiazem with improvement in her heart rate.  Her initial heart rate was in the 140s to 150s.  The patient states she has had swelling in her bilateral legs over the past few days.  Denies any associated chest pain.  She has had some shortness of breath.  She is on aspirin but no other blood thinners.   Shortness of Breath      Home Medications Prior to Admission medications   Medication Sig Start Date End Date Taking? Authorizing Provider  aspirin EC 81 MG tablet Take 81 mg by mouth daily.    [provider]  BELSOMRA 10 MG TABS Take 10 mg by mouth at bedtime. 01/20/19   [provider]  Calcium Carbonate-Vitamin D (CALTRATE 600+D PO) Take 1 tablet by mouth 2 (two) times daily.    [provider]  Cholecalciferol (VITAMIN D3) 50 MCG (2000 UT) TABS Take 2,000 Units by mouth in the morning and at bedtime.    [provider]  cyanocobalamin (VITAMIN B12) 1000 MCG tablet Take 1,000 mcg by mouth daily.    [provider]  diltiazem (CARDIZEM CD) 240 MG 24 hr capsule Take 1 capsule (240 mg total) by mouth daily. 12/09/23   Almon Hercules, MD  feeding supplement (ENSURE ENLIVE / ENSURE PLUS) LIQD Take 237 mLs by mouth 2 (two) times daily between meals. 12/08/23   Almon Hercules, MD  hydroxychloroquine (PLAQUENIL) 200 MG tablet Take 200 mg by mouth daily. 11/08/21   [provider]  hydroxyurea (HYDREA)  500 MG capsule TAKE 1 CAPSULE BY MOUTH EVERY MONDAY, WEDNESDAY, AND FRIDAY. MAY TAKE WITH FOOD TO MINIMIZE GI SIDE EFFECTS Patient taking differently: Take 500 mg by mouth every Monday, Wednesday, and Friday. May take with food to minimize GI side effects. 10/23/23   Ladene Artist, MD  isosorbide mononitrate (IMDUR) 60 MG 24 hr tablet Take 1 tablet (60 mg total) by mouth daily. 05/16/23   Jake Bathe, MD  loperamide (IMODIUM) 2 MG capsule Take 1 capsule (2 mg total) by mouth as needed for diarrhea or loose stools. 12/04/23   Briant Cedar, MD  metoprolol tartrate (LOPRESSOR) 25 MG tablet Take 1 tablet (25 mg total) by mouth 2 (two) times daily. 12/09/23   Rai, Delene Ruffini, MD  Multiple Vitamins-Minerals (MULTIVITAMIN WOMEN 50+) TABS Take 1 tablet by mouth daily with breakfast.    [provider]  pantoprazole (PROTONIX) 20 MG tablet Take 1 tablet (20 mg total) by mouth daily. 12/05/23   Briant Cedar, MD  potassium chloride (KLOR-CON M) 10 MEQ tablet Take 1 tablet (10 meq) twice daily for 3 days, then 1 tablet daily. Patient taking differently: Take 10 mEq by mouth 2 (two) times daily. 11/07/23   Rana Snare, NP  PROCTOZONE-HC 2.5 % rectal cream PLACE 1 APPLICATION RECTALLY 2 TIMES A DAY AS NEEDED FOR HEMORRHOIDS. Patient taking differently: Place 1  application  rectally 2 (two) times daily as needed for hemorrhoids. 01/09/15   Cross, Efraim Kaufmann D, NP  rosuvastatin (CRESTOR) 10 MG tablet Take 10 mg by mouth at bedtime.    [provider]  sodium bicarbonate 650 MG tablet Take 1 tablet (650 mg total) by mouth 2 (two) times daily. 12/08/23   Almon Hercules, MD  sucralfate (CARAFATE) 1 GM/10ML suspension Take 10 mLs (1 g total) by mouth 4 (four) times daily -  with meals and at bedtime. Patient taking differently: Take 1 g by mouth with breakfast, with lunch, and with evening meal. 12/04/23   Briant Cedar, MD  tiZANidine (ZANAFLEX) 4 MG tablet Take 4 mg by mouth 2 (two)  times daily as needed for muscle spasms. 10/10/23   [provider]  triamcinolone (KENALOG) 0.1 % Apply 1 application  topically as needed (for rashes- affected areas). 04/04/20   [provider]  Wound Dressings (SONAFINE) Apply 1 Application topically 2 (two) times daily as needed (Apply to areas of redness from radiation as needed. Call Dr. Colletta Maryland nurse at [phone] if you have any questions). 12/04/23   Briant Cedar, MD      Allergies    Ramipril    Review of Systems   Review of Systems  Respiratory:  Positive for shortness of breath.   Cardiovascular:  Positive for leg swelling.  All other systems reviewed and are negative.   Physical Exam Updated Vital Signs BP (!) 171/69   Pulse (!) 125   Temp 97.7 F (36.5 C)   Resp (!) 21   Ht 5\' 6"  (1.676 m)   Wt 52.2 kg   LMP 10/07/1974   SpO2 100%   BMI 18.56 kg/m  Physical Exam Vitals and nursing note reviewed.   Gen: Mild conversational dyspnea noted Eyes: PERRL, EOMI HEENT: no oropharyngeal swelling Neck: trachea midline Resp: Diminished at bilateral lung bases with rales Card: RRR, no murmurs, rubs, or gallops Abd: nontender, nondistended Extremities: no calf tenderness, the patient has 2+ pitting edema bilaterally Vascular: 2+ radial pulses bilaterally, 2+ DP pulses bilaterally Skin: no rashes   ED Results / Procedures / Treatments   Labs (all labs ordered are listed, but only abnormal results are displayed) Labs Reviewed  BASIC METABOLIC PANEL - Abnormal; Notable for the following components:      Result Value   Chloride 115 (*)    Glucose, Bld 172 (*)    BUN 40 (*)    Creatinine, Ser 1.21 (*)    Calcium 8.0 (*)    GFR, Estimated 43 (*)    All other components within normal limits  CBC - Abnormal; Notable for the following components:   WBC 12.6 (*)    RBC 2.34 (*)    Hemoglobin 7.7 (*)    HCT 24.4 (*)    MCV 104.3 (*)    RDW 18.2 (*)    All other components within normal  limits  TROPONIN I (HIGH SENSITIVITY) - Abnormal; Notable for the following components:   Troponin I (High Sensitivity) 44 (*)    All other components within normal limits  TROPONIN I (HIGH SENSITIVITY)    EKG None  Radiology DG Chest 2 View Result Date: 12/23/2023 CLINICAL DATA:  Tachycardia. Recently diagnosed atrial fibrillation. Shortness of breath. EXAM: CHEST - 2 VIEW COMPARISON:  12/05/2023. Chest, abdomen and pelvis CT dated 10/16/2023 FINDINGS: Interval mildly enlarged cardiac silhouette. Increased prominence of the pulmonary vasculature and interstitial markings with interval moderate-sized bilateral pleural  effusions and bibasilar atelectasis. Stable severe T11 vertebral compression fracture with vertebral plana and acute kyphosis. IMPRESSION: 1. Interval changes of CHF with mild cardiomegaly, interval moderate pulmonary edema and moderate-sized bilateral pleural effusions. 2. Bibasilar atelectasis. Electronically Signed   By: Beckie Salts M.D.   On: 12/23/2023 17:56    Procedures Procedures    Medications Ordered in ED Medications  diltiazem (CARDIZEM) 125 mg in dextrose 5% 125 mL (1 mg/mL) infusion (15 mg/hr Intravenous Rate/Dose Change 12/23/23 1837)  furosemide (LASIX) injection 20 mg (20 mg Intravenous Given 12/23/23 1722)    ED Course/ Medical Decision Making/ A&P                                 Medical Decision Making 89 year old female with past medical history of hypertension and atrial fibrillation presenting to the emergency department today with atrial fibrillation with RVR.  The patient did receive diltiazem and her heart rate has improved to the low 100s.  She does appear grossly volume overloaded here on exam.  Will call discussed with cardiology to see if they would like diltiazem infusion for further rate control or to switch to metoprolol given the clinical picture consistent with CHF.  I will obtain an x-ray here as well as basic labs for further evaluation.   She will require admission.  I discussed the patient's case with cardiology.  Diltiazem infusion and Lasix were recommended.  These were ordered here.  The patient's creatinine appears to be close to her baseline.  Chest x-ray interpreted by me shows pulmonary edema with bilateral pleural effusions.  A call was placed to hospitalist service for admission.  CRITICAL CARE Performed by: Durwin Glaze   Total critical care time: 36 minutes  Critical care time was exclusive of separately billable procedures and treating other patients.  Critical care was necessary to treat or prevent imminent or life-threatening deterioration.  Critical care was time spent personally by me on the following activities: development of treatment plan with patient and/or surrogate as well as nursing, discussions with consultants, evaluation of patient's response to treatment, examination of patient, obtaining history from patient or surrogate, ordering and performing treatments and interventions, ordering and review of laboratory studies, ordering and review of radiographic studies, pulse oximetry and re-evaluation of patient's condition.   Amount and/or Complexity of Data Reviewed Labs: ordered. Radiology: ordered.  Risk Prescription drug management. Decision regarding hospitalization.           Final Clinical Impression(s) / ED Diagnoses Final diagnoses:  Atrial fibrillation with RVR (HCC)  Acute pulmonary edema [facility])    Rx / DC Orders ED Discharge Orders     None         Durwin Glaze, MD 12/23/23 1907

## 2023-12-23 NOTE — Hospital Course (Addendum)
 21 yof w/ stage IV breast cancer (widely metastatic to lung, liver, spine, ribs, peritoneum), PAF not on AC, chronic HFpEF, HTN, CKD stage IIIb, anemia, HTN, recent admission 2/28 -3/04 for new onset PAF with RVR AKI on CKD 3B b/l creat 1.1-1.2, elevated troponin who is admitted with A-fib with RVR and acute on chronic HFpEF after seeing primary heart care left heart rate in 150s. In ED- workup showed elevated BNP 2 2 1-2 0.44-53 creatinine slightly up 1.2, hemoglobin 7.7 chronic WBC 12.6 from previous 19.2 on 12/12/23, cbg 304.  Chest x-ray showed interval change of CHF with mild cardiomegaly interval moderate pulmonary edema and moderate-sized bilateral pleural effusions.  Patient was given IV Lasix, Cardizem infusion cardiology consulted and admitted. Seen for leg edema could be from lymphatic plugging and question Metastatic effusion due to metastatic breast cancer. Patient diuresis regimen transition to oral Lasix 3/21.  Seen by oncology given Faslodex 3/21 and plan for outpatient follow-up.  PT OT recommending skilled nursing facility. Skilled nursing facility bed available 3/24 for discharge  Consultation: Cardiology Hematology oncology  Significant imaging/procedure: Limited echo 3/19>> lvef 70-75%, moderate asymmetric LVH , moderately elevated pulmonary artery systolic pressure.   Discharge diagnosis: Persistent Afib: Admitted with RVR,recent admission for new onset PAF on 2/28.  Seen by cardiology> plan is to continue rate control oral Cardizem and transition to long-acting on discharge, continue Lopressor, aspirin.  Not on anticoagulation due to metastatic disease.   Acute on chronic HFpEF Moderate pulmonary edema acute Bilateral pleural effusions: BNP elevated with imaging and clinical features consistent with acute on chronic HFpEF.   Seen by cardiology, managed with IV Lasix > transitioned to p.o lasix and doing well, does have leg edema likely chronic in the setting of her  metastasis. Recent echo with EF 65 to 70%, repeat echo:EF 70-75%   Hypomagnesemia Hypokalemia: Replaced.  Labs will be rechecked before discharge, continue potassium supplement at home ***   Anemia of chronic disease Mild thrombocytopenia-likely from acute illness: Hemoglobin 7.7 on admission.Baseline around 7-8.  Recheck hb at 8.5gm.Monitor outpatient.    Metastatic breast cancer: Widely metastatic breast cancer, follows with Dr. Truett Perna.  S/p Falsodex 3/21 holing Hydrea. Seen by Dr. Myrle Sheng  3/21   CKD stage IIIb: Remains stable at 1.1. Continue to monitor intermittently while on diuretics continue oral bicarb.    Hypertension: BP controlled continue oral Cardizem and metoprolol, Imdur.    Leukocytosis: RESOLVED, likley from recent steroid.  Monitor   Chronic diarrhea C diff Ag + toxin pcr neg: Patient reports he gets 1-3 loose bowel movement daily and not new.  Discussed with Dr. Thedore Mins do not recommend treatment at this time.   Goals of care-CODE STATUS: DNR limited.Overall prognosis is poor

## 2023-12-23 NOTE — ED Triage Notes (Signed)
 Pt presents to ED via EMS from [facility]. Called due to afib with heart rate of 150. Afib diagnosed 2 weeks ago. Pt reporting SOB, better following adminstration of cardiazem. Alert and oriented x4. No complaints of pain or dizziness.  10mg  cardiazem given

## 2023-12-23 NOTE — Patient Instructions (Addendum)
 You are being sent to the Emergency Room at [facility].  We sure hope you feel better soon!

## 2023-12-24 ENCOUNTER — Other Ambulatory Visit (HOSPITAL_COMMUNITY)

## 2023-12-24 ENCOUNTER — Inpatient Hospital Stay (HOSPITAL_COMMUNITY)

## 2023-12-24 DIAGNOSIS — I4891 Unspecified atrial fibrillation: Secondary | ICD-10-CM

## 2023-12-24 DIAGNOSIS — I5043 Acute on chronic combined systolic (congestive) and diastolic (congestive) heart failure: Secondary | ICD-10-CM | POA: Diagnosis not present

## 2023-12-24 DIAGNOSIS — I480 Paroxysmal atrial fibrillation: Secondary | ICD-10-CM | POA: Diagnosis not present

## 2023-12-24 LAB — BASIC METABOLIC PANEL
Anion gap: 6 (ref 5–15)
BUN: 41 mg/dL — ABNORMAL HIGH (ref 8–23)
CO2: 22 mmol/L (ref 22–32)
Calcium: 7.7 mg/dL — ABNORMAL LOW (ref 8.9–10.3)
Chloride: 107 mmol/L (ref 98–111)
Creatinine, Ser: 1.27 mg/dL — ABNORMAL HIGH (ref 0.44–1.00)
GFR, Estimated: 41 mL/min — ABNORMAL LOW (ref >60)
Glucose, Bld: 304 mg/dL — ABNORMAL HIGH (ref 70–99)
Potassium: 3.8 mmol/L (ref 3.5–5.1)
Sodium: 135 mmol/L (ref 135–145)

## 2023-12-24 LAB — CBC
HCT: 23.5 % — ABNORMAL LOW (ref 36.0–46.0)
Hemoglobin: 7.4 g/dL — ABNORMAL LOW (ref 12.0–15.0)
MCH: 32.7 pg (ref 26.0–34.0)
MCHC: 31.5 g/dL (ref 30.0–36.0)
MCV: 104 fL — ABNORMAL HIGH (ref 80.0–100.0)
Platelets: 164 10*3/uL (ref 150–400)
RBC: 2.26 MIL/uL — ABNORMAL LOW (ref 3.87–5.11)
RDW: 18 % — ABNORMAL HIGH (ref 11.5–15.5)
WBC: 11.4 10*3/uL — ABNORMAL HIGH (ref 4.0–10.5)
nRBC: 0.2 % (ref 0.0–0.2)

## 2023-12-24 LAB — ECHOCARDIOGRAM LIMITED
Height: 66 in
P 1/2 time: 440 ms
S' Lateral: 1.7 cm
Weight: 1840 [oz_av]

## 2023-12-24 LAB — FOLATE: Folate: 28 ng/mL (ref >5.9)

## 2023-12-24 LAB — IRON AND TIBC
Iron: 66 ug/dL (ref 28–170)
Saturation Ratios: 29 % (ref 10.4–31.8)
TIBC: 225 ug/dL — ABNORMAL LOW (ref 250–450)
UIBC: 159 ug/dL

## 2023-12-24 LAB — VITAMIN B12: Vitamin B-12: 1125 pg/mL — ABNORMAL HIGH (ref 180–914)

## 2023-12-24 LAB — FERRITIN: Ferritin: 323 ng/mL — ABNORMAL HIGH (ref 11–307)

## 2023-12-24 LAB — MAGNESIUM: Magnesium: 1.4 mg/dL — ABNORMAL LOW (ref 1.7–2.4)

## 2023-12-24 MED ORDER — DILTIAZEM HCL 30 MG PO TABS
60.0000 mg | ORAL_TABLET | Freq: Four times a day (QID) | ORAL | Status: DC
  Administered 2023-12-24 – 2023-12-29 (×20): 60 mg via ORAL
  Filled 2023-12-24 (×2): qty 2
  Filled 2023-12-24: qty 1
  Filled 2023-12-24: qty 2
  Filled 2023-12-24: qty 1
  Filled 2023-12-24: qty 2
  Filled 2023-12-24: qty 1
  Filled 2023-12-24 (×15): qty 2

## 2023-12-24 MED ORDER — MAGNESIUM SULFATE 4 GM/100ML IV SOLN
4.0000 g | Freq: Once | INTRAVENOUS | Status: AC
  Administered 2023-12-24: 4 g via INTRAVENOUS
  Filled 2023-12-24: qty 100

## 2023-12-24 NOTE — Inpatient Diabetes Management (Signed)
 Inpatient Diabetes Program Recommendations  AACE/ADA: New Consensus Statement on Inpatient Glycemic Control (2015)  Target Ranges:  Prepandial:   less than 140 mg/dL      Peak postprandial:   less than 180 mg/dL (1-2 hours)      Critically ill patients:  140 - 180 mg/dL   Lab Results  Component Value Date   GLUCAP 152 (H) 12/05/2023    Review of Glycemic Control  Latest Reference Range & Units 12/23/23 16:50 12/24/23 02:31  Glucose 70 - 99 mg/dL 161 (H) 096 (H)   Diabetes history: None  Current orders for Inpatient glycemic control:  None Pt was on Decadron the beginning of March  Inpatient Diabetes Program Recommendations:    Note: Lab glucose 304. Please obtain CBG and if elevated start Novolog 0-6 units tid   Thanks,  Christena Deem RN, MSN, BC-ADM Inpatient Diabetes Coordinator Team Pager [phone] (8a-5p)

## 2023-12-24 NOTE — Progress Notes (Signed)
  Echocardiogram 2D Echocardiogram has been performed.  Delcie Roch 12/24/2023, 5:06 PM

## 2023-12-24 NOTE — Progress Notes (Signed)
 PROGRESS NOTE [name]  WNU:[id number] DOB: 01-15-1934 DOA: 12/23/2023 PCP: Lorenda Ishihara, MD  Brief Narrative/Hospital Course: 51 yof w/ stage IV breast cancer (widely metastatic to lung, liver, spine, ribs, peritoneum), PAF not on AC, chronic HFpEF, HTN, CKD stage IIIb, anemia, HTN, recent admission 2/28 -3/04 for new onset PAF with RVR AKI on CKD 3B b/l creat 1.1-1.2, elevated troponin who is admitted with A-fib with RVR and acute on chronic HFpEF after seeing primary heart care left heart rate in 150s. In ED- workup showed elevated BNP 2 2 1-2 0.44-53 creatinine slightly up 1.2, hemoglobin 7.7 chronic WBC 12.6 from previous 19.2 on 12/12/23, cbg 304.  Chest x-ray showed interval change of CHF with mild cardiomegaly interval moderate pulmonary edema and moderate-sized bilateral pleural effusions.  Patient was given IV Lasix, Cardizem infusion cardiology consulted and admitted.  -- Overnight heart rate improved in 60s BP stable not hypoxic, on Cardizem drip. Labs with creatinine at 1.27 hemoglobin down to 7.4, mag 1.4  -- PAF with RVR:  Recent admission for new onset PAF on 2/28: PTA on Cardizem to 40, Lopressor 25 twice daily, aspirin not on AC 2/2 widely metastatic cancer/comorbidities. Complicated with acute on chronic HFpEF.  Continue rate control strategy with Cardizem drip metoprolol wean off as tolerated.  Cardiology consulted.  Acute on chronic HFpEF Moderate pulmonary edema acute Bilateral pleural effusions: BNP elevated with imaging and clinical features consistent with acute on chronic HFpEF.  Continue IV Lasix 40 [place], monitor intake output Daily intake output and currently has been consulted.  Recent echo with EF 65 to 70%, repeat echo ordered.  If symptomatic and if does not improve with diuretics may need thoracentesis  Hypomagnesemia: Will replace IV.  Anemia of chronic disease: Hemoglobin 7.7 on admission.  Baseline around 7-8. patient denies any obvious  bleeding.  Will check anemia panel.   Metastatic breast cancer: Widely metastatic breast cancer, follows with Dr. Truett Perna.  Current treatment is with Faslodex.  Patient also on Hydrea.   CKD stage IIIb: Renal function is stable.  Resume home sodium bicarb slowly.  Monitor with diuresis.   Hypertension: Continue Lopressor PTA on Imdur, on hold on IV diltiazem as above.  Leukocytosis: Downtrending recently on steroid.      Subjective: Patient seen and examined this morning Ill-appearing, frail, edematous leg no shortness of breath Overnight heart rate improved in 60s BP stable not hypoxic, on Cardizem drip. Labs with creatinine at 1.27 hemoglobin down to 7.4, mag 1.4   Assessment and Plan: Principal Problem:   Paroxysmal atrial fibrillation with RVR (HCC) Active Problems:   Acute on chronic heart failure with preserved ejection fraction (HFpEF, >= 50%) (HCC)   Hypertension   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)   Chronic anemia   Chronic kidney disease, stage 3b (HCC)   PAF with RVR:  Recent admission for new onset PAF on 2/28: PTA on Cardizem to 40, Lopressor 25 twice daily, aspirin not on AC 2/2 widely metastatic cancer/comorbidities. Complicated with acute on chronic HFpEF.  Continue rate control strategy with Cardizem drip metoprolol wean off as tolerated.Cardiology managing.    Acute on chronic HFpEF Moderate pulmonary edema acute Bilateral pleural effusions: BNP elevated with imaging and clinical features consistent with acute on chronic HFpEF.  Continue IV Lasix 40 iv, monitor intake output Daily intake output and currently has been consulted.  Recent echo with EF 65 to 70%, repeat echo ordered.  If symptomatic and if does not improve with diuretics may  need thoracentesis-legs are still edematous.  Hypomagnesemia: Will replace IV.  Anemia of chronic disease: Hemoglobin 7.7 on admission.  Baseline around 7-8. patient denies any obvious bleeding.  Will check  anemia panel.   Metastatic breast cancer: Widely metastatic breast cancer, follows with Dr. Truett Perna.  Current treatment is with Faslodex.  Patient also on Hydrea. Patient reports she gets monthly injection hide 2 weeks ago as next appointment Dr. Myrle Sheng coming Friday.   CKD stage IIIb: Renal function is stable.  Resume home sodium bicarb slowly.  Monitor with diuresis. Recent Labs    12/03/23 0614 12/04/23 0604 12/05/23 1536 12/06/23 0424 12/07/23 0401 12/08/23 0508 12/09/23 0659 12/12/23 1300 12/23/23 1650 12/24/23 0231  BUN 34* 33* 38* 41* 43* 41* 42* 41* 40* 41*  CREATININE 1.15* 1.21* 1.44* 1.50* 1.54* 1.41* 1.32* 1.09* 1.21* 1.27*  CO2 15* 18* 16* 14* 14* 15* 18* 22 23 22   K 2.9* 3.9 4.1 4.2 4.6 4.2 4.3 5.1 4.4 3.8     Hypertension: Continue Lopressor PTA on Imdur, on hold on IV diltiazem as above.  Leukocytosis: Downtrending recently on steroid.  Monitor  Goals of care CODE STATUS: DNR limited  DVT prophylaxis: none Code Status:   Code Status: Limited: Do not attempt resuscitation (DNR) -DNR-LIMITED -Do Not Intubate/DNI  Family Communication: plan of care discussed with patient at bedside. Patient status is: Remains hospitalized because of severity of illness Level of care: Progressive   Dispo: The patient is from: home.  Lives alone her sister and daughter nearby            Anticipated disposition: TBD Objective: Vitals last 24 hrs: Vitals:   12/24/23 1100 12/24/23 1219 12/24/23 1222 12/24/23 1238  BP: (!) 148/55 (!) 146/82 (!) 146/82 (!) 158/69  Pulse: 60 78    Resp: 15   20  Temp:  (!) 96.9 F (36.1 C)    TempSrc:  Axillary    SpO2: 100% 100%    Weight:      Height:       Weight change:   Physical Examination: General exam: alert awake, ill-appearing, frail, older than stated age HEENT:Oral mucosa moist, Ear/Nose WNL grossly Respiratory system: Bilaterally clear BS,no use of accessory muscle Cardiovascular system: S1 & S2 +, No  JVD. Gastrointestinal system: Abdomen soft,NT,ND, BS+ Nervous System: Alert, awake, moving all extremities,and following commands. Extremities: LE edema ++,distal peripheral pulses palpable and warm.  Skin: No rashes,no icterus. MSK: Normal muscle bulk,tone, power   Medications reviewed:  Scheduled Meds:  aspirin EC  81 mg Oral Daily   diltiazem  60 mg Oral Q6H   furosemide  40 mg Intravenous Q12H   metoprolol tartrate  25 mg Oral BID   pantoprazole  20 mg Oral Daily   potassium chloride  10 mEq Oral Daily   rosuvastatin  10 mg Oral QHS   sodium chloride flush  3 mL Intravenous Q12H  Continuous Infusions: Diet Order             Diet Heart Room service appropriate? Yes; Fluid consistency: Thin  Diet effective now                  Intake/Output Summary (Last 24 hours) at 12/24/2023 1325 Last data filed at 12/24/2023 0145 Gross per 24 hour  Intake --  Output 800 ml  Net -800 ml   Net IO Since Admission: -800 mL [12/24/23 1325]  Wt Readings from Last 3 Encounters:  12/23/23 52.2 kg  12/23/23 52.2 kg  12/05/23  52.1 kg     Unresulted Labs (From admission, onward)     Start     Ordered   12/25/23 0500  Basic metabolic panel  Daily,   R      12/24/23 0805   12/25/23 0500  CBC  Daily,   R      12/24/23 0805   12/25/23 0500  Magnesium  Tomorrow morning,   R        12/24/23 0805          Data Reviewed: I have personally reviewed following labs and imaging studies CBC: Recent Labs  Lab 12/23/23 1650 12/24/23 0231  WBC 12.6* 11.4*  HGB 7.7* 7.4*  HCT 24.4* 23.5*  MCV 104.3* 104.0*  PLT 197 164   Basic Metabolic Panel:  Recent Labs  Lab 12/23/23 1650 12/24/23 0231  NA 143 135  K 4.4 3.8  CL 115* 107  CO2 23 22  GLUCOSE 172* 304*  BUN 40* 41*  CREATININE 1.21* 1.27*  CALCIUM 8.0* 7.7*  MG  --  1.4*   GFR: Estimated Creatinine Clearance: 25.7 mL/min (A) (by C-G formula based on SCr of 1.27 mg/dL (H)). No results found for this or any previous visit  (from the past 240 hours).  Antimicrobials/Microbiology: Anti-infectives (From admission, onward)    None         Component Value Date/Time   SDES WOUND 02/02/2022 1419   SPECREQUEST RIGHT HAND ABSCESS 02/02/2022 1419   CULT  02/02/2022 1419    RARE PASTEURELLA MULTOCIDA Usually susceptible to penicillin and other beta lactam agents,quinolones,macrolides and tetracyclines. NO ANAEROBES ISOLATED Performed at [facility], [address] [place], [place] [address]    REPTSTATUS 02/07/2022 FINAL 02/02/2022 1419  Radiology Studies: DG Chest 2 View Result Date: 12/23/2023 CLINICAL DATA:  Tachycardia. Recently diagnosed atrial fibrillation. Shortness of breath. EXAM: CHEST - 2 VIEW COMPARISON:  12/05/2023. Chest, abdomen and pelvis CT dated 10/16/2023 FINDINGS: Interval mildly enlarged cardiac silhouette. Increased prominence of the pulmonary vasculature and interstitial markings with interval moderate-sized bilateral pleural effusions and bibasilar atelectasis. Stable severe T11 vertebral compression fracture with vertebral plana and acute kyphosis. IMPRESSION: 1. Interval changes of CHF with mild cardiomegaly, interval moderate pulmonary edema and moderate-sized bilateral pleural effusions. 2. Bibasilar atelectasis. Electronically Signed   By: Beckie Salts M.D.   On: 12/23/2023 17:56   LOS: 1 day   Total time spent in review of labs and imaging, patient evaluation, formulation of plan, documentation and communication with family: 35 minutes Lanae Boast, MD  [facility] 12/24/2023, 1:25 PM

## 2023-12-24 NOTE — Progress Notes (Signed)
 Heart Failure Navigator Progress Note  Assessed for Heart & Vascular TOC clinic readiness.  Patient does not meet criteria due to per MD note patient with Metastatic cancer. No HF TOC. .   Navigator will sign off at this time.   Rhae Hammock, BSN, Scientist, clinical (histocompatibility and immunogenetics) Only

## 2023-12-24 NOTE — Plan of Care (Signed)
  Problem: Education: Goal: Knowledge of General Education information will improve Description: Including pain rating scale, medication(s)/side effects and non-pharmacologic comfort measures Outcome: Progressing   Problem: Health Behavior/Discharge Planning: Goal: Ability to manage health-related needs will improve Outcome: Progressing   Problem: Clinical Measurements: Goal: Ability to maintain clinical measurements within normal limits will improve Outcome: Progressing Goal: Will remain free from infection Outcome: Progressing Goal: Diagnostic test results will improve Outcome: Progressing Goal: Respiratory complications will improve Outcome: Progressing Goal: Cardiovascular complication will be avoided Outcome: Progressing   Problem: Elimination: Goal: Will not experience complications related to bowel motility Outcome: Progressing Goal: Will not experience complications related to urinary retention Outcome: Progressing   Problem: Pain Managment: Goal: General experience of comfort will improve and/or be controlled Outcome: Progressing   Problem: Safety: Goal: Ability to remain free from injury will improve Outcome: Progressing   Problem: Skin Integrity: Goal: Risk for impaired skin integrity will decrease Outcome: Progressing

## 2023-12-24 NOTE — Progress Notes (Signed)
   Cardiologist:  Anne Fu  Subjective:   Breathing better and rate control much better  Objective:  Vitals:   12/24/23 0415 12/24/23 0539 12/24/23 0745 12/24/23 0845  BP: (!) 145/70 (!) 151/77 (!) 152/62 (!) 141/58  Pulse: 75 76 68 75  Resp: 14 13 12 12   Temp:      TempSrc:      SpO2: 100% 100% 100% 100%  Weight:      Height:        Intake/Output from previous day:  Intake/Output Summary (Last 24 hours) at 12/24/2023 0919 Last data filed at 12/24/2023 0145 Gross per 24 hour  Intake --  Output 800 ml  Net -800 ml    Physical Exam: Frail elderly female Tachypnea Bilateral rhonchi/rales Short systolic ejection murmur Abdomen benign Bilateral edema to mid thigh  Lab Results: Basic Metabolic Panel: Recent Labs    12/23/23 1650 12/24/23 0231  NA 143 135  K 4.4 3.8  CL 115* 107  CO2 23 22  GLUCOSE 172* 304*  BUN 40* 41*  CREATININE 1.21* 1.27*  CALCIUM 8.0* 7.7*  MG  --  1.4*   Liver Function Tests: No results for input(s): "AST", "ALT", "ALKPHOS", "BILITOT", "PROT", "ALBUMIN" in the last 72 hours. No results for input(s): "LIPASE", "AMYLASE" in the last 72 hours. CBC: Recent Labs    12/23/23 1650 12/24/23 0231  WBC 12.6* 11.4*  HGB 7.7* 7.4*  HCT 24.4* 23.5*  MCV 104.3* 104.0*  PLT 197 164    Anemia Panel: Recent Labs    12/24/23 0231  VITAMINB12 1,125*  FOLATE 28.0  FERRITIN 323*  TIBC 225*  IRON 66    Imaging: DG Chest 2 View Result Date: 12/23/2023 CLINICAL DATA:  Tachycardia. Recently diagnosed atrial fibrillation. Shortness of breath. EXAM: CHEST - 2 VIEW COMPARISON:  12/05/2023. Chest, abdomen and pelvis CT dated 10/16/2023 FINDINGS: Interval mildly enlarged cardiac silhouette. Increased prominence of the pulmonary vasculature and interstitial markings with interval moderate-sized bilateral pleural effusions and bibasilar atelectasis. Stable severe T11 vertebral compression fracture with vertebral plana and acute kyphosis. IMPRESSION: 1.  Interval changes of CHF with mild cardiomegaly, interval moderate pulmonary edema and moderate-sized bilateral pleural effusions. 2. Bibasilar atelectasis. Electronically Signed   By: Beckie Salts M.D.   On: 12/23/2023 17:56    Cardiac Studies:  ECG: afib rate 140 nonspecific ST changes    Telemetry: afib/flutter rates 60-70 bpm  Echo: pending this admission 11/25/23 apical /septal hypertrophy EF 65-70% AV sclerosis   Medications:    aspirin EC  81 mg Oral Daily   furosemide  40 mg Intravenous Q12H   metoprolol tartrate  25 mg Oral BID   pantoprazole  20 mg Oral Daily   potassium chloride  10 mEq Oral Daily   rosuvastatin  10 mg Oral QHS   sodium chloride flush  3 mL Intravenous Q12H      diltiazem (CARDIZEM) infusion 15 mg/hr (12/24/23 0137)   magnesium sulfate bolus IVPB      Assessment/Plan:   Afib:  rates improved change from iv cardizem to PO continue oral lopressor no anticoagulation due to anemia, age and widely metastatic breast cancer CHF: ? Diastolic vs malignant effusions with known mets to lungs from breast cancer Breast Cancer:  outpatient f/u Sydnee Levans Rx   DNR discussed with daughter yesterday   Charlton Haws 12/24/2023, 9:19 AM

## 2023-12-25 DIAGNOSIS — I5032 Chronic diastolic (congestive) heart failure: Secondary | ICD-10-CM

## 2023-12-25 DIAGNOSIS — I480 Paroxysmal atrial fibrillation: Secondary | ICD-10-CM | POA: Diagnosis not present

## 2023-12-25 LAB — CBC
HCT: 27.2 % — ABNORMAL LOW (ref 36.0–46.0)
Hemoglobin: 8.7 g/dL — ABNORMAL LOW (ref 12.0–15.0)
MCH: 33 pg (ref 26.0–34.0)
MCHC: 32 g/dL (ref 30.0–36.0)
MCV: 103 fL — ABNORMAL HIGH (ref 80.0–100.0)
Platelets: 158 10*3/uL (ref 150–400)
RBC: 2.64 MIL/uL — ABNORMAL LOW (ref 3.87–5.11)
RDW: 17.3 % — ABNORMAL HIGH (ref 11.5–15.5)
WBC: 9.5 10*3/uL (ref 4.0–10.5)
nRBC: 0.2 % (ref 0.0–0.2)

## 2023-12-25 LAB — BASIC METABOLIC PANEL
Anion gap: 7 (ref 5–15)
BUN: 39 mg/dL — ABNORMAL HIGH (ref 8–23)
CO2: 26 mmol/L (ref 22–32)
Calcium: 7.8 mg/dL — ABNORMAL LOW (ref 8.9–10.3)
Chloride: 106 mmol/L (ref 98–111)
Creatinine, Ser: 1.27 mg/dL — ABNORMAL HIGH (ref 0.44–1.00)
GFR, Estimated: 41 mL/min — ABNORMAL LOW (ref >60)
Glucose, Bld: 149 mg/dL — ABNORMAL HIGH (ref 70–99)
Potassium: 3.1 mmol/L — ABNORMAL LOW (ref 3.5–5.1)
Sodium: 139 mmol/L (ref 135–145)

## 2023-12-25 LAB — C DIFFICILE QUICK SCREEN W PCR REFLEX
C Diff antigen: POSITIVE — AB
C Diff toxin: NEGATIVE

## 2023-12-25 LAB — CLOSTRIDIUM DIFFICILE BY PCR, REFLEXED: Toxigenic C. Difficile by PCR: NEGATIVE

## 2023-12-25 LAB — MAGNESIUM: Magnesium: 1.8 mg/dL (ref 1.7–2.4)

## 2023-12-25 MED ORDER — VITAMIN B-12 1000 MCG PO TABS
1000.0000 ug | ORAL_TABLET | Freq: Every day | ORAL | Status: DC
Start: 2023-12-26 — End: 2023-12-29
  Administered 2023-12-26 – 2023-12-29 (×4): 1000 ug via ORAL
  Filled 2023-12-25 (×4): qty 1

## 2023-12-25 MED ORDER — VITAMIN D 25 MCG (1000 UNIT) PO TABS
2000.0000 [IU] | ORAL_TABLET | Freq: Two times a day (BID) | ORAL | Status: DC
  Administered 2023-12-25 – 2023-12-29 (×8): 2000 [IU] via ORAL
  Filled 2023-12-25 (×8): qty 2

## 2023-12-25 MED ORDER — POTASSIUM CHLORIDE 20 MEQ PO PACK
40.0000 meq | PACK | Freq: Two times a day (BID) | ORAL | Status: AC
  Administered 2023-12-25 (×2): 40 meq via ORAL
  Filled 2023-12-25 (×2): qty 2

## 2023-12-25 MED ORDER — HYDROXYCHLOROQUINE SULFATE 200 MG PO TABS
200.0000 mg | ORAL_TABLET | Freq: Every day | ORAL | Status: DC
  Administered 2023-12-25 – 2023-12-29 (×5): 200 mg via ORAL
  Filled 2023-12-25 (×5): qty 1

## 2023-12-25 MED ORDER — POTASSIUM CHLORIDE 20 MEQ PO PACK
40.0000 meq | PACK | Freq: Every day | ORAL | Status: DC
  Administered 2023-12-26 – 2023-12-29 (×4): 40 meq via ORAL
  Filled 2023-12-25 (×4): qty 2

## 2023-12-25 MED ORDER — SODIUM BICARBONATE 650 MG PO TABS
650.0000 mg | ORAL_TABLET | Freq: Two times a day (BID) | ORAL | Status: DC
  Administered 2023-12-25 – 2023-12-29 (×8): 650 mg via ORAL
  Filled 2023-12-25 (×8): qty 1

## 2023-12-25 MED ORDER — FULVESTRANT 250 MG/5ML IM SOSY
500.0000 mg | PREFILLED_SYRINGE | Freq: Once | INTRAMUSCULAR | Status: AC
  Administered 2023-12-26: 500 mg via INTRAMUSCULAR
  Filled 2023-12-25: qty 10

## 2023-12-25 MED ORDER — TIZANIDINE HCL 4 MG PO TABS
4.0000 mg | ORAL_TABLET | Freq: Two times a day (BID) | ORAL | Status: DC | PRN
  Filled 2023-12-25: qty 1

## 2023-12-25 MED ORDER — LOPERAMIDE HCL 2 MG PO CAPS: 2.0000 mg | ORAL_CAPSULE | ORAL | Status: DC | PRN

## 2023-12-25 MED ORDER — HYDROXYUREA 500 MG PO CAPS
500.0000 mg | ORAL_CAPSULE | ORAL | Status: DC
Start: 2023-12-26 — End: 2023-12-26
  Filled 2023-12-25: qty 1

## 2023-12-25 MED ORDER — ISOSORBIDE MONONITRATE ER 60 MG PO TB24
60.0000 mg | ORAL_TABLET | Freq: Every day | ORAL | Status: DC
  Administered 2023-12-26 – 2023-12-29 (×4): 60 mg via ORAL
  Filled 2023-12-25 (×4): qty 1

## 2023-12-25 MED ORDER — FUROSEMIDE 10 MG/ML IJ SOLN
40.0000 mg | Freq: Two times a day (BID) | INTRAMUSCULAR | Status: DC
  Administered 2023-12-25 (×2): 40 mg via INTRAVENOUS
  Filled 2023-12-25 (×3): qty 4

## 2023-12-25 NOTE — TOC Transition Note (Signed)
 Transition of Care [facility]) - Discharge Note   Patient Details  Name: [name] MRN: [id number] Date of Birth: Mar 01, 1934  [facility] CM/SW Contact:  Eduard Roux, LCSW Phone Number: 12/25/2023, 12:24 PM   Clinical Narrative:     CSW met with patient at bedside. CSW introduced self and explained role. Patient confirmed was at [facility] and wants to return once medically stable.  TOC will continue to follow and assist with discharge Planning.   Antony Blackbird, MSW, LCSW Clinical Social Worker     Final next level of care: Skilled Nursing Facility Barriers to Discharge: Continued Medical Work up   Patient Goals and CMS Choice            Discharge Placement                       Discharge Plan and Services Additional resources added to the After Visit Summary for                                       Social Drivers of Health (SDOH) Interventions SDOH Screenings   Food Insecurity: No Food Insecurity (12/23/2023)  Housing: Low Risk  (12/23/2023)  Transportation Needs: No Transportation Needs (12/23/2023)  Utilities: Not At Risk (12/23/2023)  Depression (PHQ2-9): Low Risk  (11/11/2023)  Social Connections: Moderately Isolated (12/23/2023)  Tobacco Use: Low Risk  (12/23/2023)     Readmission Risk Interventions    12/09/2023   12:21 PM 11/23/2023   11:56 AM  Readmission Risk Prevention Plan  Transportation Screening Complete Complete  PCP or Specialist Appt within 5-7 Days  Complete  Home Care Screening  Complete  Medication Review (RN CM)  Complete  Skilled Nursing Facility Complete

## 2023-12-25 NOTE — Plan of Care (Signed)
  Problem: Clinical Measurements: Goal: Ability to maintain clinical measurements within normal limits will improve Outcome: Progressing Goal: Cardiovascular complication will be avoided Outcome: Progressing   Problem: Activity: Goal: Risk for activity intolerance will decrease Outcome: Progressing   Problem: Skin Integrity: Goal: Risk for impaired skin integrity will decrease Outcome: Progressing   Problem: Education: Goal: Ability to demonstrate management of disease process will improve Outcome: Progressing   Problem: Activity: Goal: Capacity to carry out activities will improve Outcome: Progressing   Problem: Cardiac: Goal: Ability to achieve and maintain adequate cardiopulmonary perfusion will improve Outcome: Progressing   Problem: Education: Goal: Knowledge of disease or condition will improve Outcome: Progressing   Problem: Activity: Goal: Ability to tolerate increased activity will improve Outcome: Progressing   Problem: Cardiac: Goal: Ability to achieve and maintain adequate cardiopulmonary perfusion will improve Outcome: Progressing   Problem: Health Behavior/Discharge Planning: Goal: Ability to safely manage health-related needs after discharge will improve Outcome: Progressing

## 2023-12-25 NOTE — Progress Notes (Signed)
 PROGRESS NOTE [name]  ZHY:[id number] DOB: 04-13-1934 DOA: 12/23/2023 PCP: Lorenda Ishihara, MD  Brief Narrative/Hospital Course: 89 yof w/ stage IV breast cancer (widely metastatic to lung, liver, spine, ribs, peritoneum), PAF not on AC, chronic HFpEF, HTN, CKD stage IIIb, anemia, HTN, recent admission 2/28 -3/04 for new onset PAF with RVR AKI on CKD 3B b/l creat 1.1-1.2, elevated troponin who is admitted with A-fib with RVR and acute on chronic HFpEF after seeing primary heart care left heart rate in 150s. In ED- workup showed elevated BNP 2 2 1-2 0.44-53 creatinine slightly up 1.2, hemoglobin 7.7 chronic WBC 12.6 from previous 19.2 on 12/12/23, cbg 304.  Chest x-ray showed interval change of CHF with mild cardiomegaly interval moderate pulmonary edema and moderate-sized bilateral pleural effusions.  Patient was given IV Lasix, Cardizem infusion cardiology consulted and admitted.  Consultation: Cardiology  Significant imaging/procedure: Limited echo 3/19>> lvef 70-75%, moderate asymmetric LVH , moderately elevated pulmonary artery systolic pressure.   Subjective: Patient seen and examined this morning Reports she is breathing better-still edematous legs. Reports she is breathing better-still edematous legs. Overnight afebrile BP stable on room air Labs showing hypokalemia 3.1 stable renal function at 1.2 hemoglobin up at 8.7 Anemia panel showed normal folate hide B12 ferritin 323   Assessment and Plan: Principal Problem:   Paroxysmal atrial fibrillation with RVR (HCC) Active Problems:   Acute on chronic heart failure with preserved ejection fraction (HFpEF, >= 50%) (HCC)   Hypertension   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)   Chronic anemia   Chronic kidney disease, stage 3b (HCC)   Diastolic CHF, chronic (HCC)   Permanent AFib with RVR:  Recent admission for new onset PAF on 2/28: PTA on Cardizem, Lopressor 25 twice daily, aspirin not on anticoagulation 2/2 widely metastatic cancer/comorbidities. Complicated  with acute on chronic HFpEF. Continue rate control strategy -Cardizem drip transition to metoprolol 25 twice daily, Cardizem p.o. rate fairly stable this morning. Cardiology following continue to monitor telemetry  Acute on chronic HFpEF Moderate pulmonary edema acute Bilateral pleural effusions: BNP elevated with imaging and clinical features consistent with acute on chronic HFpEF.   Appreciate cardiology input in managing with IV Lasix 40 iv,  Recent echo with EF 65 to 70%, repeat echo ordered. Continue to monitor intake output, daily weight, electrolytes.  Hypomagnesemia Hypokalemia: Replace electrolytes.  Potassium low this morning  Anemia of chronic disease: Hemoglobin 7.7 on admission.  Baseline around 7-8.  This morning up at 8.7.  Anemia panel unremarkable   Metastatic breast cancer: Widely metastatic breast cancer, follows with Dr. Truett Perna.  Current treatment is with Faslodex.  Patient also on Hydrea. Patient reports she gets monthly injection hide 2 weeks ago as next appointment Dr. Myrle Sheng coming Friday-I will send him a message that patient is here.   CKD stage IIIb: Renal function is stable.  Monitor closely while on diuretics, Recent Labs    12/04/23 0604 12/05/23 1536 12/06/23 0424 12/07/23 0401 12/08/23 0508 12/09/23 0659 12/12/23 1300 12/23/23 1650 12/24/23 0231 12/25/23 0312  BUN 33* 38* 41* 43* 41* 42* 41* 40* 41* 39*  CREATININE 1.21* 1.44* 1.50* 1.54* 1.41* 1.32* 1.09* 1.21* 1.27* 1.27*  CO2 18* 16* 14* 14* 15* 18* 22 23 22 26   K 3.9 4.1 4.2 4.6 4.2 4.3 5.1 4.4 3.8 3.1*     Hypertension: BP controlled continue oral Cardizem and metoprolol   Leukocytosis: Downtrended.likely from recent steroid.  Monitor  Chronic diarrhea: Patient reports he gets 1-3 loose bowel movement daily and not new.  Goals  of care CODE STATUS: DNR limited.  Overall prognosis is poor  DVT prophylaxis: none Code Status:   Code Status: Limited: Do not attempt resuscitation  (DNR) -DNR-LIMITED -Do Not Intubate/DNI  Family Communication: plan of care discussed with patient at bedside. Patient status is: Remains hospitalized because of severity of illness Level of care: Progressive   Dispo: The patient is from: home.  Lives alone her sister and daughter nearby            Anticipated disposition: TBD.  Obtain PT OT evaluation requested Objective: Vitals last 24 hrs: Vitals:   12/24/23 2331 12/25/23 0417 12/25/23 0555 12/25/23 0831  BP: (!) 152/55 (!) 147/56 (!) 147/56 (!) 146/57  Pulse: 72   90  Resp: 19   20  Temp: (!) 97.4 F (36.3 C)   (!) 97.3 F (36.3 C)  TempSrc: Oral Oral  Oral  SpO2: 95%   98%  Weight:  58.8 kg    Height:       Weight change: 6.636 kg  Physical Examination: General exam: alert awake, oriented well, thin and frail  HEENT:Oral mucosa moist, Ear/Nose WNL grossly Respiratory system: Bilaterally clear BS,no use of accessory muscle Cardiovascular system: S1 & S2 +, No JVD. Gastrointestinal system: Abdomen soft,NT,ND, BS+ Nervous System: Alert, awake, moving all extremities,and following commands. Extremities: LE edema +,distal peripheral pulses palpable and warm.  Skin: No rashes,no icterus. MSK: thin muscle bulk,tone, power   Medications reviewed:  Scheduled Meds:  aspirin EC  81 mg Oral Daily   diltiazem  60 mg Oral Q6H   furosemide  40 mg Intravenous BID   metoprolol tartrate  25 mg Oral BID   pantoprazole  20 mg Oral Daily   potassium chloride  40 mEq Oral BID   [START ON 12/26/2023] potassium chloride  40 mEq Oral Daily   rosuvastatin  10 mg Oral QHS   sodium chloride flush  3 mL Intravenous Q12H  Continuous Infusions: Diet Order             Diet Heart Room service appropriate? Yes; Fluid consistency: Thin  Diet effective now                  Intake/Output Summary (Last 24 hours) at 12/25/2023 1003 Last data filed at 12/25/2023 0600 Gross per 24 hour  Intake 805.78 ml  Output 2000 ml  Net -1194.22 ml    Net IO Since Admission: -1,994.22 mL [12/25/23 1003]  Wt Readings from Last 3 Encounters:  12/25/23 58.8 kg  12/23/23 52.2 kg  12/05/23 52.1 kg     Unresulted Labs (From admission, onward)     Start     Ordered   12/25/23 0859  C Difficile Quick Screen w PCR reflex  (C Difficile quick screen w PCR reflex panel )  Once, for 24 hours,   TIMED       References:    CDiff Information Tool   12/25/23 0858   12/25/23 0500  Basic metabolic panel  Daily,   R      12/24/23 0805   12/25/23 0500  CBC  Daily,   R      12/24/23 0805          Data Reviewed: I have personally reviewed following labs and imaging studies CBC: Recent Labs  Lab 12/23/23 1650 12/24/23 0231 12/25/23 0312  WBC 12.6* 11.4* 9.5  HGB 7.7* 7.4* 8.7*  HCT 24.4* 23.5* 27.2*  MCV 104.3* 104.0* 103.0*  PLT 197 164 158  Basic Metabolic Panel:  Recent Labs  Lab 12/23/23 1650 12/24/23 0231 12/25/23 0312  NA 143 135 139  K 4.4 3.8 3.1*  CL 115* 107 106  CO2 23 22 26   GLUCOSE 172* 304* 149*  BUN 40* 41* 39*  CREATININE 1.21* 1.27* 1.27*  CALCIUM 8.0* 7.7* 7.8*  MG  --  1.4* 1.8   GFR: Estimated Creatinine Clearance: 29 mL/min (A) (by C-G formula based on SCr of 1.27 mg/dL (H)). No results found for this or any previous visit (from the past 240 hours).  Antimicrobials/Microbiology: Anti-infectives (From admission, onward)    None         Component Value Date/Time   SDES WOUND 02/02/2022 1419   SPECREQUEST RIGHT HAND ABSCESS 02/02/2022 1419   CULT  02/02/2022 1419    RARE PASTEURELLA MULTOCIDA Usually susceptible to penicillin and other beta lactam agents,quinolones,macrolides and tetracyclines. NO ANAEROBES ISOLATED Performed at [facility], [address] [place], [place] [address]    REPTSTATUS 02/07/2022 FINAL 02/02/2022 1419  Radiology Studies: ECHOCARDIOGRAM LIMITED Result Date: 12/24/2023    ECHOCARDIOGRAM LIMITED REPORT   Patient Name:   TOTIANA EVERSON Date of Exam: 12/24/2023  Medical Rec #:  [id number]        Height:       66.0 in Accession #:    [id number]       Weight:       115.0 lb Date of Birth:  [birth year]       BSA:          1.581 m Patient Age:    89 years         BP:           158/69 mmHg Patient Gender: F                HR:           80 bpm. Exam Location:  Inpatient Procedure: Limited Echo, Color Doppler and Cardiac Doppler (Both Spectral and            Color Flow Doppler were utilized during procedure). Indications:    atrial fibrillation  History:        Patient has prior history of Echocardiogram examinations, most                 recent 11/25/2023. CHF, Cancer. Chronic kidney disease,                 Arrythmias:Atrial Fibrillation; Risk Factors:Hypertension.  Sonographer:    Delcie Roch RDCS Referring Phys: [id number] VISHAL R PATEL IMPRESSIONS  1. Left ventricular ejection fraction, by estimation, is 70 to 75%. The left ventricle has hyperdynamic function. There is moderate asymmetric left ventricular hypertrophy of the basal-septal segment.  2. The mitral valve is degenerative. Trivial mitral valve regurgitation. Moderate mitral annular calcification.  3. The aortic valve is tricuspid. There is mild calcification of the aortic valve. There is mild thickening of the aortic valve. Aortic valve regurgitation is moderate. Aortic regurgitation PHT measures 440 msec.  4. There is moderately elevated pulmonary artery systolic pressure.  5. The inferior vena cava is dilated in size with >50% respiratory variability, suggesting right atrial pressure of 8 mmHg. FINDINGS  Left Ventricle: Left ventricular ejection fraction, by estimation, is 70 to 75%. The left ventricle has hyperdynamic function. There is moderate asymmetric left ventricular hypertrophy of the basal-septal segment. Right Ventricle: There is moderately elevated pulmonary artery systolic pressure. The tricuspid regurgitant velocity is 3.43 m/s, and  with an assumed right atrial pressure of 8 mmHg, the estimated  right ventricular systolic pressure is 55.1 mmHg. Mitral Valve: The mitral valve is degenerative in appearance. There is mild thickening of the mitral valve leaflet(s). There is mild calcification of the mitral valve leaflet(s). Moderate mitral annular calcification. Trivial mitral valve regurgitation. Tricuspid Valve: Tricuspid valve regurgitation is mild. Aortic Valve: The aortic valve is tricuspid. There is mild calcification of the aortic valve. There is mild thickening of the aortic valve. Aortic valve regurgitation is moderate. Aortic regurgitation PHT measures 440 msec. Pulmonic Valve: Pulmonic valve regurgitation is trivial. Venous: The inferior vena cava is dilated in size with greater than 50% respiratory variability, suggesting right atrial pressure of 8 mmHg. Additional Comments: Spectral Doppler performed. Color Doppler performed.  LEFT VENTRICLE PLAX 2D LVIDd:         3.20 cm LVIDs:         1.70 cm LV PW:         1.01 cm LV IVS:        1.57 cm  IVC IVC diam: 2.20 cm LEFT ATRIUM         Index LA diam:    4.40 cm 2.78 cm/m  AORTIC VALVE LVOT Vmax:   90.70 cm/s LVOT Vmean:  60.200 cm/s LVOT VTI:    0.194 m AI PHT:      440 msec TRICUSPID VALVE TR Peak grad:   47.1 mmHg TR Vmax:        343.00 cm/s  SHUNTS Systemic VTI: 0.19 m Chilton Si MD Electronically signed by Chilton Si MD Signature Date/Time: 12/24/2023/5:38:46 PM    Final    DG Chest 2 View Result Date: 12/23/2023 CLINICAL DATA:  Tachycardia. Recently diagnosed atrial fibrillation. Shortness of breath. EXAM: CHEST - 2 VIEW COMPARISON:  12/05/2023. Chest, abdomen and pelvis CT dated 10/16/2023 FINDINGS: Interval mildly enlarged cardiac silhouette. Increased prominence of the pulmonary vasculature and interstitial markings with interval moderate-sized bilateral pleural effusions and bibasilar atelectasis. Stable severe T11 vertebral compression fracture with vertebral plana and acute kyphosis. IMPRESSION: 1. Interval changes of CHF with  mild cardiomegaly, interval moderate pulmonary edema and moderate-sized bilateral pleural effusions. 2. Bibasilar atelectasis. Electronically Signed   By: Beckie Salts M.D.   On: 12/23/2023 17:56   LOS: 2 days   Total time spent in review of labs and imaging, patient evaluation, formulation of plan, documentation and communication with family: 35 minutes Lanae Boast, MD  [facility] 12/25/2023, 10:03 AM

## 2023-12-25 NOTE — Progress Notes (Addendum)
 Patient Name: [name] Date of Encounter: 12/25/2023 [facility] Cardiologist: Donato Schultz, MD   Interval Summary  .    Record comfortably no chest pain or shortness of breath.  Does report having improvement since admission.  Vital Signs .    Vitals:   12/24/23 2112 12/24/23 2331 12/25/23 0417 12/25/23 0555  BP: (!) 159/72 (!) 152/55 (!) 147/56 (!) 147/56  Pulse: 94 72    Resp:  19    Temp:  (!) 97.4 F (36.3 C)    TempSrc:  Oral Oral   SpO2:  95%    Weight:   58.8 kg   Height:        Intake/Output Summary (Last 24 hours) at 12/25/2023 0825 Last data filed at 12/25/2023 0600 Gross per 24 hour  Intake 805.78 ml  Output 2000 ml  Net -1194.22 ml      12/25/2023    4:17 AM 12/23/2023    3:54 PM 12/23/2023    2:14 PM  Last 3 Weights  Weight (lbs) 129 lb 10.1 oz 115 lb 115 lb  Weight (kg) 58.8 kg 52.164 kg 52.164 kg      Telemetry/ECG    Atrial fibrillation heart rates in the 80s 90s- Personally Reviewed  CV Studies    Limited echocardiogram 12/24/2023 1. Left ventricular ejection fraction, by estimation, is 70 to 75%. The  left ventricle has hyperdynamic function. There is moderate asymmetric  left ventricular hypertrophy of the basal-septal segment.   2. The mitral valve is degenerative. Trivial mitral valve regurgitation.  Moderate mitral annular calcification.   3. The aortic valve is tricuspid. There is mild calcification of the  aortic valve. There is mild thickening of the aortic valve. Aortic valve  regurgitation is moderate. Aortic regurgitation PHT measures 440 msec.   4. There is moderately elevated pulmonary artery systolic pressure.   5. The inferior vena cava is dilated in size with >50% respiratory  variability, suggesting right atrial pressure of 8 mmHg.    Physical Exam .   GEN: No acute distress.   Neck: No JVD Cardiac: Irregularly irregular, 2/6 murmur Respiratory: Clear to auscultation bilaterally. GI: Soft, nontender,  non-distended  MS: 2+ pitting edema  Patient Profile    [name] is a 89 y.o. female has hx of breast cancer with metastasis to liver, peritoneum, and spine, prior ovarian cancer, moderate nonobstructive CAD with chronic episodic chest tightness, HTN, hypertrophic cardiomyoapthy, chronic HFpEF, paroxysmal atrial fibrillation, normal renal artery duplex 2017, HLD, GERD, kidney stones, carotid artery disease (1-39% BICA 03/2022 with >50% L CCA), PACs/atrial bigeminy, essential thrombocytosis.  Seen outpatient with CHF exacerbation, admitted for further diuresis and A-fib RVR Assessment & Plan .     Acute HFpEF Hypertrophic cardiomyopathy Hyperdynamic EF 70 to 75% with moderate asymptomatic LVH of the basal septum segment, degenerative MV, moderate AR.  No effusion noted on limited echo.  Diuresing well with improvement in symptoms.  -2 L in last 24 hours Continue with IV Lasix 40mg  twice daily.  Potassium 3.1 today.  Will give potassium 40 mEq x 2 today.  Then likely can give 40 mill equivalents daily thereafter while in IV. Chest x-ray with moderate-sized bilateral pleural effusions may be related to underlying cancer versus CHF.  Currently no plans for thoracentesis.  Permanent atrial fibrillation Rate controlled now with heart rates in the 80s to 90s. Continue with diltiazem 60 mg every 6, Lopressor 25 mg twice daily Not on anticoagulation due to anemia, age,  metastatic breast cancer, therefore no plans for DCCV ever.   Moderate nonobstructive CAD 2007 Noted on remote catheterization, no anginal complaints Continue with aspirin as long as hemoglobin and platelets are stable.  Continue with rosuvastatin  Metastatic breast cancer stage IV Anemia Overall poor prognosis.  Was supposed to see her oncologist this Friday for injections.   For questions or updates, please contact [facility] Please consult [email or web address] for contact info under        Signed, Abagail Kitchens, PA-C

## 2023-12-25 NOTE — Progress Notes (Signed)
 Mobility Specialist Progress Note:    12/25/23 0924  Mobility  Activity Turned to right side;Turned to left side;Turned to back - supine  Level of Assistance Minimal assist, patient does 75% or more  Range of Motion/Exercises Active;All extremities  Activity Response Tolerated well  Mobility Referral Yes  Mobility visit 1 Mobility  Mobility Specialist Start Time (ACUTE ONLY) [id number]  Mobility Specialist Stop Time (ACUTE ONLY) [id number]  Mobility Specialist Time Calculation (min) (ACUTE ONLY) 14 min   Pt received in bed, had incontinent BM. Assisted NT in rolling pt in bed for peri care. Pt tolerated well. When turning to left, pt HR increased to 110 bpm and pt went into Afib. Peri care complete. MD in room and notified. Left pt with all needs met.   Feliciana Rossetti Mobility Specialist Please contact via Special educational needs teacher or  Rehab office at [phone]

## 2023-12-26 ENCOUNTER — Inpatient Hospital Stay

## 2023-12-26 ENCOUNTER — Inpatient Hospital Stay: Admitting: Nurse Practitioner

## 2023-12-26 DIAGNOSIS — I480 Paroxysmal atrial fibrillation: Secondary | ICD-10-CM | POA: Diagnosis not present

## 2023-12-26 LAB — BASIC METABOLIC PANEL
Anion gap: 9 (ref 5–15)
BUN: 34 mg/dL — ABNORMAL HIGH (ref 8–23)
CO2: 25 mmol/L (ref 22–32)
Calcium: 7.7 mg/dL — ABNORMAL LOW (ref 8.9–10.3)
Chloride: 107 mmol/L (ref 98–111)
Creatinine, Ser: 1.11 mg/dL — ABNORMAL HIGH (ref 0.44–1.00)
GFR, Estimated: 48 mL/min — ABNORMAL LOW (ref >60)
Glucose, Bld: 93 mg/dL (ref 70–99)
Potassium: 3.4 mmol/L — ABNORMAL LOW (ref 3.5–5.1)
Sodium: 141 mmol/L (ref 135–145)

## 2023-12-26 LAB — CBC
HCT: 24.8 % — ABNORMAL LOW (ref 36.0–46.0)
Hemoglobin: 8.2 g/dL — ABNORMAL LOW (ref 12.0–15.0)
MCH: 33.6 pg (ref 26.0–34.0)
MCHC: 33.1 g/dL (ref 30.0–36.0)
MCV: 101.6 fL — ABNORMAL HIGH (ref 80.0–100.0)
Platelets: 122 10*3/uL — ABNORMAL LOW (ref 150–400)
RBC: 2.44 MIL/uL — ABNORMAL LOW (ref 3.87–5.11)
RDW: 17.2 % — ABNORMAL HIGH (ref 11.5–15.5)
WBC: 8.2 10*3/uL (ref 4.0–10.5)
nRBC: 0 % (ref 0.0–0.2)

## 2023-12-26 LAB — MAGNESIUM: Magnesium: 1.5 mg/dL — ABNORMAL LOW (ref 1.7–2.4)

## 2023-12-26 MED ORDER — FUROSEMIDE 40 MG PO TABS
40.0000 mg | ORAL_TABLET | Freq: Every day | ORAL | Status: DC
  Administered 2023-12-26 – 2023-12-29 (×4): 40 mg via ORAL
  Filled 2023-12-26 (×4): qty 1

## 2023-12-26 MED ORDER — MAGNESIUM SULFATE 2 GM/50ML IV SOLN
2.0000 g | Freq: Once | INTRAVENOUS | Status: AC
  Administered 2023-12-26: 2 g via INTRAVENOUS
  Filled 2023-12-26: qty 50

## 2023-12-26 MED ORDER — MAGNESIUM OXIDE -MG SUPPLEMENT 400 (240 MG) MG PO TABS
400.0000 mg | ORAL_TABLET | Freq: Two times a day (BID) | ORAL | Status: AC
  Administered 2023-12-26 – 2023-12-28 (×6): 400 mg via ORAL
  Filled 2023-12-26 (×6): qty 1

## 2023-12-26 NOTE — Progress Notes (Signed)
 IP PROGRESS NOTE  Subjective:   [name] is well-known to me with a history of metastatic breast cancer.  She was discharged from the hospital 12/09/2023 after admission with rapid atrial fibrillation.  She was readmitted 12/23/2023 with dyspnea and rapid atrial fibrillation.  She has been evaluated by cardiology for diuresis and rate control.  She denies pain.  She has chronic increased stool frequency and developed diarrhea yesterday.  Objective: Vital signs in last 24 hours: Blood pressure 133/62, pulse 94, temperature 97.6 F (36.4 C), temperature source Oral, resp. rate 16, height 5\' 6"  (1.676 m), weight 117 lb 14.4 oz (53.5 kg), last menstrual period 10/07/1974, SpO2 96%.  Intake/Output from previous day: 03/20 0701 - 03/21 0700 In: 240 [P.O.:240] Out: 1075 [Urine:1075]  Physical Exam:  HEENT: No thrush Lungs: Creased breath sounds at the right greater than left lower posterior chest, no respiratory distress Cardiac: Irregular Abdomen: Nontender, no hepatosplenomegaly Extremities: 1-2+ pitting edema to lower leg and foot bilaterally   Lab Results: Recent Labs    12/24/23 0231 12/25/23 0312  WBC 11.4* 9.5  HGB 7.4* 8.7*  HCT 23.5* 27.2*  PLT 164 158    BMET Recent Labs    12/24/23 0231 12/25/23 0312  NA 135 139  K 3.8 3.1*  CL 107 106  CO2 22 26  GLUCOSE 304* 149*  BUN 41* 39*  CREATININE 1.27* 1.27*  CALCIUM 7.7* 7.8*    Lab Results  Component Value Date   CA125 12 07/15/2016    Studies/Results: ECHOCARDIOGRAM LIMITED Result Date: 12/24/2023    ECHOCARDIOGRAM LIMITED REPORT   Patient Name:   [name] Date of Exam: 12/24/2023 Medical Rec #:  [id number]        Height:       66.0 in Accession #:    [id number]       Weight:       115.0 lb Date of Birth:  08-11-1934       BSA:          1.581 m Patient Age:    89 years         BP:           158/69 mmHg Patient Gender: F                HR:           80 bpm. Exam Location:  Inpatient Procedure: Limited  Echo, Color Doppler and Cardiac Doppler (Both Spectral and            Color Flow Doppler were utilized during procedure). Indications:    atrial fibrillation  History:        Patient has prior history of Echocardiogram examinations, most                 recent 11/25/2023. CHF, Cancer. Chronic kidney disease,                 Arrythmias:Atrial Fibrillation; Risk Factors:Hypertension.  Sonographer:    Delcie Roch RDCS Referring Phys: [id number] VISHAL R PATEL IMPRESSIONS  1. Left ventricular ejection fraction, by estimation, is 70 to 75%. The left ventricle has hyperdynamic function. There is moderate asymmetric left ventricular hypertrophy of the basal-septal segment.  2. The mitral valve is degenerative. Trivial mitral valve regurgitation. Moderate mitral annular calcification.  3. The aortic valve is tricuspid. There is mild calcification of the aortic valve. There is mild thickening of the aortic valve. Aortic valve regurgitation is moderate. Aortic regurgitation PHT measures  440 msec.  4. There is moderately elevated pulmonary artery systolic pressure.  5. The inferior vena cava is dilated in size with >50% respiratory variability, suggesting right atrial pressure of 8 mmHg. FINDINGS  Left Ventricle: Left ventricular ejection fraction, by estimation, is 70 to 75%. The left ventricle has hyperdynamic function. There is moderate asymmetric left ventricular hypertrophy of the basal-septal segment. Right Ventricle: There is moderately elevated pulmonary artery systolic pressure. The tricuspid regurgitant velocity is 3.43 m/s, and with an assumed right atrial pressure of 8 mmHg, the estimated right ventricular systolic pressure is 55.1 mmHg. Mitral Valve: The mitral valve is degenerative in appearance. There is mild thickening of the mitral valve leaflet(s). There is mild calcification of the mitral valve leaflet(s). Moderate mitral annular calcification. Trivial mitral valve regurgitation. Tricuspid Valve: Tricuspid  valve regurgitation is mild. Aortic Valve: The aortic valve is tricuspid. There is mild calcification of the aortic valve. There is mild thickening of the aortic valve. Aortic valve regurgitation is moderate. Aortic regurgitation PHT measures 440 msec. Pulmonic Valve: Pulmonic valve regurgitation is trivial. Venous: The inferior vena cava is dilated in size with greater than 50% respiratory variability, suggesting right atrial pressure of 8 mmHg. Additional Comments: Spectral Doppler performed. Color Doppler performed.  LEFT VENTRICLE PLAX 2D LVIDd:         3.20 cm LVIDs:         1.70 cm LV PW:         1.01 cm LV IVS:        1.57 cm  IVC IVC diam: 2.20 cm LEFT ATRIUM         Index LA diam:    4.40 cm 2.78 cm/m  AORTIC VALVE LVOT Vmax:   90.70 cm/s LVOT Vmean:  60.200 cm/s LVOT VTI:    0.194 m AI PHT:      440 msec TRICUSPID VALVE TR Peak grad:   47.1 mmHg TR Vmax:        343.00 cm/s  SHUNTS Systemic VTI: 0.19 m Chilton Si MD Electronically signed by Chilton Si MD Signature Date/Time: 12/24/2023/5:38:46 PM    Final     Medications: I have reviewed the patient's current medications.  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  Status post a needle localized lumpectomy and sentinel lymph node biopsy 04/13/2009 with the pathology confirming high-grade ductal carcinoma in situ with an associated 0.26 mm invasive carcinoma, ER positive, PR positive, and HER2 positive. Status post adjuvant right breast radiation  completed 06/19/2009. Initiation of Arimidex 05/01/2009.  Completed 5 years in July 2015 Family history of breast cancer. History of Clostridium difficile colitis. Frequent bowel movements following the bowel resection in 2009. 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 CTs 10/16/2023-innumerable lung nodules both lungs; multiple new ill-defined low-density liver lesions; bony metastatic disease in the thoracic and lumbar spine.  Severe compression fracture at T11 with osseous retropulsion.  Small right greater than left pleural effusions. Biopsy liver lesion 10/30/2023-metastatic moderate to poorly differentiated adenocarcinoma consistent with breast origin; HER2 negative (1+), ER +100%, PR +30%, Ki-67 60%, ESR 1 nutation positive, PIK3CA negative Faslodex/Abemaciclib 11/07/2023, Abemaciclib discontinued on hospital admission 11/21/2023 Elevated platelet count-essential thrombocytosis 07/31/22- postitive [id number] 03/05/2023-hydroxyurea 500 mg Monday, Wednesday, and Friday CHEK2 pathogenic mutation positive Severe back pain-MRI 10/09/2023 with compression fracture at T11, abnormal signal T9, T10, T11 and T12 as well as several adjacent ribs; right greater than left pleural effusion Palliative radiation to the lower thoracic, lumbar, and upper sacrum 11/18/2023-12/03/2023 12.  Admission 11/21/2023 with acute renal failure, elevated liver enzymes, dehydration, and severe back pain Renal failure and elevated liver enzymes improved,  discharged 12/04/2023 13.  Anemia/thrombocytopenia-likely secondary to Abemaciclib, metastatic breast cancer involving the bone marrow, and radiation 14.  Admission 12/05/2023 with chest pain and rapid atrial fibrillation 15.  Admission 12/23/2023 with rapid atrial fibrillation and CHF 16.  Anemia secondary to chronic disease, metastatic breast cancer involving the bone marrow, phlebotomy, and chemotherapy/radiation   [name] is again admitted with atrial fibrillation and volume overload.  The heart rate is controlled at present.  Her overall clinical status appears improved since completing radiation to the spine and 1 cycle of Faslodex/Abemaciclib.  She will resume treatment with Faslodex today.  She has anemia.  This is multifactorial including components from chronic disease, phlebotomy, and chemotherapy/radiation.  She has been taking hydroxyurea for treatment of essential thrombocytosis.  The platelet count is in goal range.  I recommend discontinuing hydroxyurea.  She has chronic frequent bowel movements.  She reports increased diarrhea.  The C. difficile antigen test returned positive, but toxin testing returned negative.  Recommendations: Hold hydroxyurea Limit blood draws as possible Faslodex today Outpatient follow-up and Faslodex at the cancer center in 2 weeks Management of atrial fibrillation/CHF per the medical and cardiology services 6.   Transfuse packed red blood cells for symptomatic anemia 7.   Please call oncology as needed, I will check on her 12/29/2023 if she remains in the hospital  LOS: 3 days   Thornton Papas, MD   12/26/2023, 8:08 AM

## 2023-12-26 NOTE — Progress Notes (Addendum)
 PROGRESS NOTE JAYLI FOGLEMAN  OZH:[id number] DOB: June 30, 1934 DOA: 12/23/2023 PCP: Lorenda Ishihara, MD  Brief Narrative/Hospital Course: 89 yof w/ stage IV breast cancer (widely metastatic to lung, liver, spine, ribs, peritoneum), PAF not on AC, chronic HFpEF, HTN, CKD stage IIIb, anemia, HTN, recent admission 2/28 -3/04 for new onset PAF with RVR AKI on CKD 3B b/l creat 1.1-1.2, elevated troponin who is admitted with A-fib with RVR and acute on chronic HFpEF after seeing primary heart care left heart rate in 150s. In ED- workup showed elevated BNP 2 2 1-2 0.44-53 creatinine slightly up 1.2, hemoglobin 7.7 chronic WBC 12.6 from previous 19.2 on 12/12/23, cbg 304.  Chest x-ray showed interval change of CHF with mild cardiomegaly interval moderate pulmonary edema and moderate-sized bilateral pleural effusions.  Patient was given IV Lasix, Cardizem infusion cardiology consulted and admitted. Seen for leg edema could be from lymphatic plugging and question Metastatic effusion due to metastatic breast cancer.  Consultation: Cardiology  Significant imaging/procedure: Limited echo 3/19>> lvef 70-75%, moderate asymmetric LVH , moderately elevated pulmonary artery systolic pressure.   Subjective: Patient seen and examined this morning she is resting comfortably overall no complaint  Having loose stool which is her chronic problem C. difficile antigen was positive toxin negative  Leg edematous but no shortness of breath on room air   Assessment and Plan: Principal Problem:   Paroxysmal atrial fibrillation with RVR (HCC) Active Problems:   Acute on chronic heart failure with preserved ejection fraction (HFpEF, >= 50%) (HCC)   Hypertension   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)   Chronic anemia   Chronic kidney disease, stage 3b (HCC)   Diastolic CHF, chronic (HCC)   Permanent AFib with RVR:  Recent admission for new onset PAF on 2/28: PTA on Cardizem, Lopressor 25 bid aspirin not  on anticoagulation 2/2 widely metastatic cancer/comorbidities. Complicated with acute on chronic HFpEF. Continue rate control strategy -Cardizem drip transitioned to metoprolol 25 twice daily, Cardizem p.o.  Cardiology following continue to monitor telemetry  Acute on chronic HFpEF Moderate pulmonary edema acute Bilateral pleural effusions: BNP elevated with imaging and clinical features consistent with acute on chronic HFpEF.   Appreciate cardiology input in managed with IV Lasix > Lasix transitioned to p.o. Recent echo with EF 65 to 70%, repeat echo:EF 70-75%  Hypomagnesemia Hypokalemia: Replace k and mag- k 3.4, mag 1.5  Anemia of chronic disease Mild thrombocytopenia-likely from acute illness: Hemoglobin 7.7 on admission.Baseline around 7-8. Stable ~ 8.  Monitor platelet count.   Metastatic breast cancer: Widely metastatic breast cancer, follows with Dr. Truett Perna.  Current treatment is with Faslodex.  Patient also on Hydrea. Patient reports she gets monthly injection hide 2 weeks ago as next appointment Dr. Myrle Sheng this Friday- he ordered her monthly inj for 3/21   CKD stage IIIb: Renal function is stable.  Monitor closely while on diuretics, continue her oral bicarb. Recent Labs    12/05/23 1536 12/06/23 0424 12/07/23 0401 12/08/23 0508 12/09/23 0659 12/12/23 1300 12/23/23 1650 12/24/23 0231 12/25/23 0312 12/26/23 0843  BUN 38* 41* 43* 41* 42* 41* 40* 41* 39* 34*  CREATININE 1.44* 1.50* 1.54* 1.41* 1.32* 1.09* 1.21* 1.27* 1.27* 1.11*  CO2 16* 14* 14* 15* 18* 22 23 22 26 25   K 4.1 4.2 4.6 4.2 4.3 5.1 4.4 3.8 3.1* 3.4*   Hypertension: BP controlled continue oral Cardizem and metoprolol, Imdur.   Leukocytosis: RESOLVED, likley from recent steroid.  Monitor  Chronic diarrhea C diff Ag + toxin pcr  neg: Patient reports he gets 1-3 loose bowel movement daily and not new.  Will discuss with ID if needs rx.  Goals of care-CODE STATUS: DNR limited.Overall prognosis is  poor.  DVT prophylaxis: none Code Status:   Code Status: Limited: Do not attempt resuscitation (DNR) -DNR-LIMITED -Do Not Intubate/DNI  Family Communication: plan of care discussed with patient at bedside. Patient status is: Remains hospitalized because of severity of illness Level of care: Progressive   Dispo:The patient is from: home.Lives alone her sister and daughter nearby            Anticipated disposition:Pending PT OT evaluation for dispo.  Objective: Vitals last 24 hrs: Vitals:   12/25/23 2311 12/26/23 0146 12/26/23 0400 12/26/23 0805  BP: 133/66 (!) 128/54 (!) 148/82 133/62  Pulse: (!) 114 81 98 94  Resp: 18 14 19 16   Temp: (!) 97.5 F (36.4 C) (!) 97.3 F (36.3 C) (!) 97.4 F (36.3 C) 97.6 F (36.4 C)  TempSrc: Oral Oral Oral Oral  SpO2: 98% 98% 96% 96%  Weight:   53.5 kg   Height:       Weight change: -5.321 kg  Physical Examination: General exam: alert awake, frail. HEENT:Oral mucosa moist, Ear/Nose WNL grossly. Respiratory system: Bilaterally clear BS,no use of accessory muscle. Cardiovascular system: S1 & S2 +, No JVD. Gastrointestinal system: Abdomen soft,NT,ND, BS+ Nervous System: Alert, awake, moving all extremities,and following commands. Extremities: LE edema +,distal peripheral pulses palpable and warm.  Skin: No rashes,no icterus. MSK: Normal muscle bulk,tone, power.   Medications reviewed:  Scheduled Meds:  aspirin EC  81 mg Oral Daily   cholecalciferol  2,000 Units Oral BID   cyanocobalamin  1,000 mcg Oral Daily   diltiazem  60 mg Oral Q6H   fulvestrant  500 mg Intramuscular Once   furosemide  40 mg Oral Daily   hydroxychloroquine  200 mg Oral Daily   isosorbide mononitrate  60 mg Oral Daily   metoprolol tartrate  25 mg Oral BID   pantoprazole  20 mg Oral Daily   potassium chloride  40 mEq Oral Daily   rosuvastatin  10 mg Oral QHS   sodium bicarbonate  650 mg Oral BID   sodium chloride flush  3 mL Intravenous Q12H  Continuous  Infusions: Diet Order             Diet Heart Room service appropriate? Yes; Fluid consistency: Thin  Diet effective now                  Intake/Output Summary (Last 24 hours) at 12/26/2023 1128 Last data filed at 12/25/2023 2350 Gross per 24 hour  Intake 240 ml  Output 1075 ml  Net -835 ml   Net IO Since Admission: -2,829.22 mL [12/26/23 1128]  Wt Readings from Last 3 Encounters:  12/26/23 53.5 kg  12/23/23 52.2 kg  12/05/23 52.1 kg     Unresulted Labs (From admission, onward)     Start     Ordered   12/25/23 0500  Basic metabolic panel  Daily,   R      12/24/23 0805   12/25/23 0500  CBC  Daily,   R      12/24/23 0805          Data Reviewed: I have personally reviewed following labs and imaging studies CBC: Recent Labs  Lab 12/23/23 1650 12/24/23 0231 12/25/23 0312 12/26/23 0843  WBC 12.6* 11.4* 9.5 8.2  HGB 7.7* 7.4* 8.7* 8.2*  HCT 24.4* 23.5*  27.2* 24.8*  MCV 104.3* 104.0* 103.0* 101.6*  PLT 197 164 158 122*   Basic Metabolic Panel:  Recent Labs  Lab 12/23/23 1650 12/24/23 0231 12/25/23 0312 12/26/23 0843  NA 143 135 139 141  K 4.4 3.8 3.1* 3.4*  CL 115* 107 106 107  CO2 23 22 26 25   GLUCOSE 172* 304* 149* 93  BUN 40* 41* 39* 34*  CREATININE 1.21* 1.27* 1.27* 1.11*  CALCIUM 8.0* 7.7* 7.8* 7.7*  MG  --  1.4* 1.8 1.5*   GFR: Estimated Creatinine Clearance: 30.2 mL/min (A) (by C-G formula based on SCr of 1.11 mg/dL (H)). Recent Results (from the past 240 hours)  C Difficile Quick Screen w PCR reflex     Status: Abnormal   Collection Time: 12/25/23  8:59 AM   Specimen: STOOL  Result Value Ref Range Status   C Diff antigen POSITIVE (A) NEGATIVE Final   C Diff toxin NEGATIVE NEGATIVE Final   C Diff interpretation Results are indeterminate. See PCR results.  Final    Comment: Performed at [facility], [address], [place], [place] [address]  C. Diff by PCR, Reflexed     Status: None   Collection Time: 12/25/23  8:59 AM  Result Value  Ref Range Status   Toxigenic C. Difficile by PCR NEGATIVE NEGATIVE Final    Comment: Patient is colonized with non toxigenic C. difficile. May not need treatment unless significant symptoms are present. Performed at [facility], [address] [place], [place] [address]     Antimicrobials/Microbiology: Anti-infectives (From admission, onward)    Start     Dose/Rate Route Frequency Ordered Stop   12/25/23 1430  hydroxychloroquine (PLAQUENIL) tablet 200 mg        200 mg Oral Daily 12/25/23 1334           Component Value Date/Time   SDES WOUND 02/02/2022 1419   SPECREQUEST RIGHT HAND ABSCESS 02/02/2022 1419   CULT  02/02/2022 1419    RARE PASTEURELLA MULTOCIDA Usually susceptible to penicillin and other beta lactam agents,quinolones,macrolides and tetracyclines. NO ANAEROBES ISOLATED Performed at [facility], [address], [place], [place] [address]    REPTSTATUS 02/07/2022 FINAL 02/02/2022 1419  Radiology Studies: ECHOCARDIOGRAM LIMITED Result Date: 12/24/2023    ECHOCARDIOGRAM LIMITED REPORT   Patient Name:   MAGDALEN CABANA Date of Exam: 12/24/2023 Medical Rec #:  [id number]        Height:       66.0 in Accession #:    [id number]       Weight:       115.0 lb Date of Birth:  07-18-1934       BSA:          1.581 m Patient Age:    89 years         BP:           158/69 mmHg Patient Gender: F                HR:           80 bpm. Exam Location:  Inpatient Procedure: Limited Echo, Color Doppler and Cardiac Doppler (Both Spectral and            Color Flow Doppler were utilized during procedure). Indications:    atrial fibrillation  History:        Patient has prior history of Echocardiogram examinations, most  recent 11/25/2023. CHF, Cancer. Chronic kidney disease,                 Arrythmias:Atrial Fibrillation; Risk Factors:Hypertension.  Sonographer:    Delcie Roch RDCS Referring Phys: [id number] VISHAL R PATEL IMPRESSIONS  1. Left ventricular ejection  fraction, by estimation, is 70 to 75%. The left ventricle has hyperdynamic function. There is moderate asymmetric left ventricular hypertrophy of the basal-septal segment.  2. The mitral valve is degenerative. Trivial mitral valve regurgitation. Moderate mitral annular calcification.  3. The aortic valve is tricuspid. There is mild calcification of the aortic valve. There is mild thickening of the aortic valve. Aortic valve regurgitation is moderate. Aortic regurgitation PHT measures 440 msec.  4. There is moderately elevated pulmonary artery systolic pressure.  5. The inferior vena cava is dilated in size with >50% respiratory variability, suggesting right atrial pressure of 8 mmHg. FINDINGS  Left Ventricle: Left ventricular ejection fraction, by estimation, is 70 to 75%. The left ventricle has hyperdynamic function. There is moderate asymmetric left ventricular hypertrophy of the basal-septal segment. Right Ventricle: There is moderately elevated pulmonary artery systolic pressure. The tricuspid regurgitant velocity is 3.43 m/s, and with an assumed right atrial pressure of 8 mmHg, the estimated right ventricular systolic pressure is 55.1 mmHg. Mitral Valve: The mitral valve is degenerative in appearance. There is mild thickening of the mitral valve leaflet(s). There is mild calcification of the mitral valve leaflet(s). Moderate mitral annular calcification. Trivial mitral valve regurgitation. Tricuspid Valve: Tricuspid valve regurgitation is mild. Aortic Valve: The aortic valve is tricuspid. There is mild calcification of the aortic valve. There is mild thickening of the aortic valve. Aortic valve regurgitation is moderate. Aortic regurgitation PHT measures 440 msec. Pulmonic Valve: Pulmonic valve regurgitation is trivial. Venous: The inferior vena cava is dilated in size with greater than 50% respiratory variability, suggesting right atrial pressure of 8 mmHg. Additional Comments: Spectral Doppler performed.  Color Doppler performed.  LEFT VENTRICLE PLAX 2D LVIDd:         3.20 cm LVIDs:         1.70 cm LV PW:         1.01 cm LV IVS:        1.57 cm  IVC IVC diam: 2.20 cm LEFT ATRIUM         Index LA diam:    4.40 cm 2.78 cm/m  AORTIC VALVE LVOT Vmax:   90.70 cm/s LVOT Vmean:  60.200 cm/s LVOT VTI:    0.194 m AI PHT:      440 msec TRICUSPID VALVE TR Peak grad:   47.1 mmHg TR Vmax:        343.00 cm/s  SHUNTS Systemic VTI: 0.19 m Chilton Si MD Electronically signed by Chilton Si MD Signature Date/Time: 12/24/2023/5:38:46 PM    Final    LOS: 3 days   Total time spent in review of labs and imaging, patient evaluation, formulation of plan, documentation and communication with family: 35 minutes Lanae Boast, MD  [facility] 12/26/2023, 11:28 AM

## 2023-12-26 NOTE — Care Management Important Message (Signed)
 Important Message  Patient Details  Name: [name] MRN: [id number] Date of Birth: 1933/11/18   Important Message Given:  Yes - Medicare IM     Renie Ora 12/26/2023, 10:06 AM

## 2023-12-26 NOTE — Evaluation (Signed)
 Occupational Therapy Evaluation Patient Details Name: [name] MRN: [id number] DOB: [birth year] Today's Date: 12/26/2023   History of Present Illness   Pt is a 89 year old female admitted with A-fib with RVR and acute on chronic HFpEF after seeing primary heart care and HR up in the 150s.  PMH of stage IV breast cancer (widely metastatic to lung, liver, spine, ribs, peritoneum) which she is going through treatment for currently, PAF not on AC, chronic HFpEF, HTN, CKD stage IIIb, anemia, HTN, recent admission 2/28 -3/04 for new onset PAF with RVR.     Clinical Impressions Pt currently at mod assist level for functional transfers with use of the RW and for B/D tasks sit to stand.  Prior to admission she was getting rehab at SNF secondary to living alone and not having consistent assist.  HR stable ranging from 90 up to 108 with activity.  Oxygen sats at 97% on room air with dyspnea at 2/4.  Feel she will benefit from acute care OT at this time in order to help increased endurance, balance, and overall functional performance of basic selfcare tasks.  Recommend return to  inpatient follow up therapy, <3 hours/day post acute stay.        If plan is discharge home, recommend the following:   Assistance with cooking/housework;A lot of help with walking and/or transfers;A lot of help with bathing/dressing/bathroom;Help with stairs or ramp for entrance     Functional Status Assessment   Patient has had a recent decline in their functional status and demonstrates the ability to make significant improvements in function in a reasonable and predictable amount of time.     Equipment Recommendations   None recommended by OT;Other (comment) (TBD next venue of care)      Precautions/Restrictions   Precautions Precautions: Fall Recall of Precautions/Restrictions: Intact Restrictions Weight Bearing Restrictions Per Provider Order: No     Mobility Bed Mobility Overal bed mobility:  Needs Assistance Bed Mobility: Supine to Sit     Supine to sit: Min assist     General bed mobility comments: Min assist for advancing the LLE to the edge of the bed.    Transfers Overall transfer level: Needs assistance Equipment used: Rolling walker (2 wheels) Transfers: Sit to/from Stand, Bed to chair/wheelchair/BSC Sit to Stand: Min assist     Step pivot transfers: Mod assist     General transfer comment: Mod assist for sit to stand from the EOB and 3:1 during toileting.      Balance Overall balance assessment: Needs assistance Sitting-balance support: No upper extremity supported Sitting balance-Leahy Scale: Good     Standing balance support: Bilateral upper extremity supported, During functional activity Standing balance-Leahy Scale: Poor Standing balance comment: Pt needs BUE support with use of the RW for support.                           ADL either performed or assessed with clinical judgement   ADL Overall ADL's : Needs assistance/impaired Eating/Feeding: Independent   Grooming: Wash/dry hands;Sitting   Upper Body Bathing: Set up;Sitting   Lower Body Bathing: Moderate assistance;Sit to/from stand   Upper Body Dressing : Set up   Lower Body Dressing: Moderate assistance;Sit to/from stand   Toilet Transfer: Moderate assistance;Stand-pivot;BSC/3in1   Toileting- Clothing Manipulation and Hygiene: Moderate assistance;Sit to/from stand       Functional mobility during ADLs: Moderate assistance;Rolling walker (2 wheels) (ambulation in the room) General ADL Comments:  Pt currently needing increased assist for sit to stand from the EOB, 3:1 and recliner.  Once standing she was able to ambulate short distances with min assist and use of the RW.  HR in the 90's increasing up to 109 BPM with standing and transfers.  Dyspnea 2/4 with activity but O2 sats at 97% or better on room air.     Vision Baseline Vision/History: 0 No visual deficits Ability  to See in Adequate Light: 0 Adequate Patient Visual Report: No change from baseline Vision Assessment?: No apparent visual deficits     Perception Perception: Within Functional Limits       Praxis Praxis: WFL       Pertinent Vitals/Pain Pain Assessment Pain Assessment: Faces Faces Pain Scale: Hurts a little bit Pain Location: generalized Pain Descriptors / Indicators: Discomfort Pain Intervention(s): Limited activity within patient's tolerance, Monitored during session, Repositioned     Extremity/Trunk Assessment Upper Extremity Assessment Upper Extremity Assessment: Generalized weakness (strength 3+/5 throughout)   Lower Extremity Assessment Lower Extremity Assessment: Defer to PT evaluation   Cervical / Trunk Assessment Cervical / Trunk Assessment: Kyphotic   Communication Communication Communication: No apparent difficulties   Cognition Arousal: Alert Behavior During Therapy: WFL for tasks assessed/performed Cognition: No apparent impairments                               Following commands: Intact                  Home Living Family/patient expects to be discharged to:: Skilled nursing facility (Pt from [facility] where she was at for rehab from  previous admission)                                            OT Problem List: Decreased strength;Decreased range of motion;Decreased safety awareness;Decreased activity tolerance;Decreased knowledge of use of DME or AE;Impaired balance (sitting and/or standing);Pain;Cardiopulmonary status limiting activity   OT Treatment/Interventions: Self-care/ADL training;Therapeutic activities;Therapeutic exercise;Patient/family education;Energy conservation;DME and/or AE instruction;Balance training      OT Goals(Current goals can be found in the care plan section)   Acute Rehab OT Goals Patient Stated Goal: Pt wants to return to rehab and get her strength back OT Goal Formulation:  With patient Time For Goal Achievement: 01/09/24 Potential to Achieve Goals: Good   OT Frequency:  Min 1X/week       AM-PAC OT "6 Clicks" Daily Activity     Outcome Measure Help from another person eating meals?: None Help from another person taking care of personal grooming?: A Little Help from another person toileting, which includes using toliet, bedpan, or urinal?: A Lot Help from another person bathing (including washing, rinsing, drying)?: A Lot Help from another person to put on and taking off regular upper body clothing?: A Little Help from another person to put on and taking off regular lower body clothing?: A Lot 6 Click Score: 16   End of Session Equipment Utilized During Treatment: Gait belt;Rolling walker (2 wheels) Nurse Communication: Mobility status;Other (comment) (HR)  Activity Tolerance: Patient tolerated treatment well Patient left: with call bell/phone within reach;in chair  OT Visit Diagnosis: Unsteadiness on feet (R26.81);Muscle weakness (generalized) (M62.81);Other abnormalities of gait and mobility (R26.89)                Time: 6962-9528  OT Time Calculation (min): 41 min Charges:  OT General Charges $OT Visit: 1 Visit OT Evaluation $OT Eval Moderate Complexity: 1 Mod OT Treatments $Self Care/Home Management : 23-37 mins  Perrin Maltese, OTR/L Acute Rehabilitation Services  Office [phone] 12/26/2023

## 2023-12-26 NOTE — Progress Notes (Signed)
 Cardiologist:  Anne Fu  Subjective:   Breathing better and rate control much better some loose BM;s and  C diff positive   Objective:  Vitals:   12/25/23 2311 12/26/23 0146 12/26/23 0400 12/26/23 0805  BP: 133/66 (!) 128/54 (!) 148/82 133/62  Pulse: (!) 114 81 98 94  Resp: 18 14 19 16   Temp: (!) 97.5 F (36.4 C) (!) 97.3 F (36.3 C) (!) 97.4 F (36.3 C) 97.6 F (36.4 C)  TempSrc: Oral Oral Oral Oral  SpO2: 98% 98% 96% 96%  Weight:   53.5 kg   Height:        Intake/Output from previous day:  Intake/Output Summary (Last 24 hours) at 12/26/2023 0926 Last data filed at 12/25/2023 2350 Gross per 24 hour  Intake 240 ml  Output 1075 ml  Net -835 ml    Physical Exam: Frail elderly female Tachypnea Bilateral rhonchi/rales Short systolic ejection murmur Abdomen benign Bilateral edema to mid thigh  Lab Results: Basic Metabolic Panel: Recent Labs    12/24/23 0231 12/25/23 0312  NA 135 139  K 3.8 3.1*  CL 107 106  CO2 22 26  GLUCOSE 304* 149*  BUN 41* 39*  CREATININE 1.27* 1.27*  CALCIUM 7.7* 7.8*  MG 1.4* 1.8   Liver Function Tests: No results for input(s): "AST", "ALT", "ALKPHOS", "BILITOT", "PROT", "ALBUMIN" in the last 72 hours. No results for input(s): "LIPASE", "AMYLASE" in the last 72 hours. CBC: Recent Labs    12/25/23 0312 12/26/23 0843  WBC 9.5 8.2  HGB 8.7* 8.2*  HCT 27.2* 24.8*  MCV 103.0* 101.6*  PLT 158 122*    Anemia Panel: Recent Labs    12/24/23 0231  VITAMINB12 1,125*  FOLATE 28.0  FERRITIN 323*  TIBC 225*  IRON 66    Imaging: ECHOCARDIOGRAM LIMITED Result Date: 12/24/2023    ECHOCARDIOGRAM LIMITED REPORT   Patient Name:   [name] Date of Exam: 12/24/2023 Medical Rec #:  [id number]        Height:       66.0 in Accession #:    [id number]       Weight:       115.0 lb Date of Birth:  Jun 02, 1934       BSA:          1.581 m Patient Age:    89 years         BP:           158/69 mmHg Patient Gender: F                HR:            80 bpm. Exam Location:  Inpatient Procedure: Limited Echo, Color Doppler and Cardiac Doppler (Both Spectral and            Color Flow Doppler were utilized during procedure). Indications:    atrial fibrillation  History:        Patient has prior history of Echocardiogram examinations, most                 recent 11/25/2023. CHF, Cancer. Chronic kidney disease,                 Arrythmias:Atrial Fibrillation; Risk Factors:Hypertension.  Sonographer:    Delcie Roch RDCS Referring Phys: [id number] VISHAL R PATEL IMPRESSIONS  1. Left ventricular ejection fraction, by estimation, is 70 to 75%. The left ventricle has hyperdynamic function. There is moderate asymmetric left ventricular hypertrophy of the basal-septal segment.  2. The mitral valve is degenerative. Trivial mitral valve regurgitation. Moderate mitral annular calcification.  3. The aortic valve is tricuspid. There is mild calcification of the aortic valve. There is mild thickening of the aortic valve. Aortic valve regurgitation is moderate. Aortic regurgitation PHT measures 440 msec.  4. There is moderately elevated pulmonary artery systolic pressure.  5. The inferior vena cava is dilated in size with >50% respiratory variability, suggesting right atrial pressure of 8 mmHg. FINDINGS  Left Ventricle: Left ventricular ejection fraction, by estimation, is 70 to 75%. The left ventricle has hyperdynamic function. There is moderate asymmetric left ventricular hypertrophy of the basal-septal segment. Right Ventricle: There is moderately elevated pulmonary artery systolic pressure. The tricuspid regurgitant velocity is 3.43 m/s, and with an assumed right atrial pressure of 8 mmHg, the estimated right ventricular systolic pressure is 55.1 mmHg. Mitral Valve: The mitral valve is degenerative in appearance. There is mild thickening of the mitral valve leaflet(s). There is mild calcification of the mitral valve leaflet(s). Moderate mitral annular calcification.  Trivial mitral valve regurgitation. Tricuspid Valve: Tricuspid valve regurgitation is mild. Aortic Valve: The aortic valve is tricuspid. There is mild calcification of the aortic valve. There is mild thickening of the aortic valve. Aortic valve regurgitation is moderate. Aortic regurgitation PHT measures 440 msec. Pulmonic Valve: Pulmonic valve regurgitation is trivial. Venous: The inferior vena cava is dilated in size with greater than 50% respiratory variability, suggesting right atrial pressure of 8 mmHg. Additional Comments: Spectral Doppler performed. Color Doppler performed.  LEFT VENTRICLE PLAX 2D LVIDd:         3.20 cm LVIDs:         1.70 cm LV PW:         1.01 cm LV IVS:        1.57 cm  IVC IVC diam: 2.20 cm LEFT ATRIUM         Index LA diam:    4.40 cm 2.78 cm/m  AORTIC VALVE LVOT Vmax:   90.70 cm/s LVOT Vmean:  60.200 cm/s LVOT VTI:    0.194 m AI PHT:      440 msec TRICUSPID VALVE TR Peak grad:   47.1 mmHg TR Vmax:        343.00 cm/s  SHUNTS Systemic VTI: 0.19 m Chilton Si MD Electronically signed by Chilton Si MD Signature Date/Time: 12/24/2023/5:38:46 PM    Final     Cardiac Studies:  ECG: afib rate 140nonspecific ST changes    Telemetry: afib/flutter rates 80-90 bpm  Echo:  11/25/23 apical /septal hypertrophy EF 65-70% AV sclerosis    12/24/23 EF 70-75% LVH trivial MR moderate AR   Medications:    aspirin EC  81 mg Oral Daily   cholecalciferol  2,000 Units Oral BID   cyanocobalamin  1,000 mcg Oral Daily   diltiazem  60 mg Oral Q6H   fulvestrant  500 mg Intramuscular Once   furosemide  40 mg Intravenous BID   hydroxychloroquine  200 mg Oral Daily   isosorbide mononitrate  60 mg Oral Daily   metoprolol tartrate  25 mg Oral BID   pantoprazole  20 mg Oral Daily   potassium chloride  40 mEq Oral Daily   rosuvastatin  10 mg Oral QHS   sodium bicarbonate  650 mg Oral BID   sodium chloride flush  3 mL Intravenous Q12H        Assessment/Plan:   Afib:  rates improved  change from iv cardizem to PO continue oral lopressor no  anticoagulation due to anemia, age and widely metastatic breast cancer CHF: ? Diastolic vs malignant effusions with known mets to lungs from breast cancer Good diuresis change to oral lasix 40 mg daily  Breast Cancer:  outpatient f/u [facility]  GI:  C diff positive ? Need for Flagyl per primary service   DNR discussed with daughter in agreement   Charlton Haws 12/26/2023, 9:26 AM

## 2023-12-27 DIAGNOSIS — D649 Anemia, unspecified: Secondary | ICD-10-CM

## 2023-12-27 DIAGNOSIS — I480 Paroxysmal atrial fibrillation: Secondary | ICD-10-CM | POA: Diagnosis not present

## 2023-12-27 DIAGNOSIS — C50919 Malignant neoplasm of unspecified site of unspecified female breast: Secondary | ICD-10-CM | POA: Diagnosis not present

## 2023-12-27 DIAGNOSIS — C7951 Secondary malignant neoplasm of bone: Secondary | ICD-10-CM

## 2023-12-27 DIAGNOSIS — E878 Other disorders of electrolyte and fluid balance, not elsewhere classified: Secondary | ICD-10-CM

## 2023-12-27 DIAGNOSIS — I4819 Other persistent atrial fibrillation: Secondary | ICD-10-CM

## 2023-12-27 LAB — BASIC METABOLIC PANEL
Anion gap: 6 (ref 5–15)
BUN: 32 mg/dL — ABNORMAL HIGH (ref 8–23)
CO2: 27 mmol/L (ref 22–32)
Calcium: 7.6 mg/dL — ABNORMAL LOW (ref 8.9–10.3)
Chloride: 106 mmol/L (ref 98–111)
Creatinine, Ser: 1.13 mg/dL — ABNORMAL HIGH (ref 0.44–1.00)
GFR, Estimated: 47 mL/min — ABNORMAL LOW (ref >60)
Glucose, Bld: 119 mg/dL — ABNORMAL HIGH (ref 70–99)
Potassium: 3.4 mmol/L — ABNORMAL LOW (ref 3.5–5.1)
Sodium: 139 mmol/L (ref 135–145)

## 2023-12-27 LAB — CBC
HCT: 23.1 % — ABNORMAL LOW (ref 36.0–46.0)
Hemoglobin: 7.6 g/dL — ABNORMAL LOW (ref 12.0–15.0)
MCH: 33.3 pg (ref 26.0–34.0)
MCHC: 32.9 g/dL (ref 30.0–36.0)
MCV: 101.3 fL — ABNORMAL HIGH (ref 80.0–100.0)
Platelets: 115 10*3/uL — ABNORMAL LOW (ref 150–400)
RBC: 2.28 MIL/uL — ABNORMAL LOW (ref 3.87–5.11)
RDW: 17.3 % — ABNORMAL HIGH (ref 11.5–15.5)
WBC: 6.2 10*3/uL (ref 4.0–10.5)
nRBC: 0 % (ref 0.0–0.2)

## 2023-12-27 NOTE — Progress Notes (Signed)
 Cardiologist:  Anne Fu  Subjective:   Sitting up in chair. Denies anginal chest pain or failure symptoms.   Wishes to be discharged soon  Objective:  Vitals:   12/27/23 0604 12/27/23 0607 12/27/23 0809 12/27/23 1109  BP:  (!) 156/49 (!) 128/51 (!) 159/74  Pulse:  96 87 (!) 105  Resp: (!) 33 15    Temp:  (!) 97.2 F (36.2 C) (!) 97.3 F (36.3 C)   TempSrc:  Oral Oral   SpO2:  96% 96% 97%  Weight: 53.7 kg     Height:        Intake/Output from previous day:  Intake/Output Summary (Last 24 hours) at 12/27/2023 1156 Last data filed at 12/27/2023 1100 Gross per 24 hour  Intake 180 ml  Output 300 ml  Net -120 ml    Physical Exam: General: Frail, chronically ill appearing, hemodynamically stable, no acute distress. HEENT: Dry mucous membranes, neck is supple, no JVP Lungs: Clear to auscultation bilaterally, no wheezes rales or rhonchi's Heart: Irregularly irregular, variable S1-S2 Abdomen: Soft, nontender, nondistended, positive bowel sounds all 4 quadrants Extremities: Bilateral lower extremity swelling, warm to touch Neuro: Alert and oriented X 4, no acute distress, 4 extremities, focal neurological deficits  Lab Results: Basic Metabolic Panel: Recent Labs    12/25/23 0312 12/26/23 0843 12/27/23 0310  NA 139 141 139  K 3.1* 3.4* 3.4*  CL 106 107 106  CO2 26 25 27   GLUCOSE 149* 93 119*  BUN 39* 34* 32*  CREATININE 1.27* 1.11* 1.13*  CALCIUM 7.8* 7.7* 7.6*  MG 1.8 1.5*  --    Liver Function Tests: No results for input(s): "AST", "ALT", "ALKPHOS", "BILITOT", "PROT", "ALBUMIN" in the last 72 hours. No results for input(s): "LIPASE", "AMYLASE" in the last 72 hours. CBC: Recent Labs    12/26/23 0843 12/27/23 0310  WBC 8.2 6.2  HGB 8.2* 7.6*  HCT 24.8* 23.1*  MCV 101.6* 101.3*  PLT 122* 115*    Anemia Panel: No results for input(s): "VITAMINB12", "FOLATE", "FERRITIN", "TIBC", "IRON", "RETICCTPCT" in the last 72 hours.   Imaging: No results  found.   Cardiac Studies:  ECG: afib rate 140nonspecific ST changes    Telemetry: afib controlled ventricular response.    Echo:  11/25/23 apical /septal hypertrophy EF 65-70% AV sclerosis    12/24/23 EF 70-75% LVH trivial MR moderate AR   Medications:    aspirin EC  81 mg Oral Daily   cholecalciferol  2,000 Units Oral BID   cyanocobalamin  1,000 mcg Oral Daily   diltiazem  60 mg Oral Q6H   furosemide  40 mg Oral Daily   hydroxychloroquine  200 mg Oral Daily   isosorbide mononitrate  60 mg Oral Daily   magnesium oxide  400 mg Oral BID   metoprolol tartrate  25 mg Oral BID   pantoprazole  20 mg Oral Daily   potassium chloride  40 mEq Oral Daily   rosuvastatin  10 mg Oral QHS   sodium bicarbonate  650 mg Oral BID   sodium chloride flush  3 mL Intravenous Q12H   Assessment/Plan:   Persistent Afib:   Controlled ventricular rate. Continue Lopressor 25 mg p.o. twice daily.   Continue Cardizem 60 mg p.o. every 6 hours with holding parameters, transition to once a day dosing depending on how much she requires during this hospitalization on a daily basis.  This can be done closer to discharge.   Continue aspirin 81 mg p.o. daily. Not on anticoagulation  given her advanced age, fall risk, underlying anemia, and metastatic breast cancer.  This has been discussed with the patient and next of kin.  They are aware that aspirin 81 mg is not sufficient enough to prevent thromboembolic events and therefore should be cognizant of strokelike symptoms and if present medical attention should be seeked.  Continue oral diuretics facilitate weight loss.   Cardiology will sign off. Call us if needed.  Spoke to nursing staff during rounds.  Macrocytic anemia/thrombocytopenia: Does not endorse evidence of bleeding.  Monitor blood indices.  Electrolyte abnormalities: Replacement per primary team Breast Cancer: Follows with medical oncology  Tessa Lerner, DO, [facility] [place]  [facility]  [address] [place], [place] [address] 11:56 AM

## 2023-12-27 NOTE — Progress Notes (Signed)
 Physical Therapy Evaluation Patient Details Name: [name] MRN: [id number] DOB: [birth year] Today's Date: 12/27/2023  History of Present Illness  89 year old female presents to [facility] 12/23/23 from SNF with A-fib with RVR and acute on chronic HFpEF after seeing primary heart care and HR up in the 150s. Chest x-ray showed mild cardiomegaly, moderate pulmonary edema, moderate B pleural effusions. PMHx: stage IV breast cancer (widely metastatic to lung, liver, spine, ribs, peritoneum) which she is going through treatment for currently, PAF not on AC, chronic HFpEF, HTN, CKD stage IIIb, anemia, HTN, recent admission 2/28 -3/04 for new onset PAF with RVR.   Clinical Impression  Pt in recliner upon arrival and agreeable to PT eval. Pt arrived to the hospital from a SNF where she was needed assistance to stand and with dressing/bathing. Once pt has assistance to stand, she reports being able to ambulate with a RW with no assistance. In today's session, pt required MaxA and two attempts to rise from the recliner. Pt was then able to ambulate ~30 ft with CGA and RW. Pt currently with functional limitations due to the deficits listed below (see PT Problem List). Pt would benefit from acute skilled PT to address functional impairments. Recommending post-acute rehab <3hrs to work towards independence with mobility. Acute PT to follow.         If plan is discharge home, recommend the following: A lot of help with walking and/or transfers;A lot of help with bathing/dressing/bathroom;Assistance with cooking/housework;Assist for transportation;Help with stairs or ramp for entrance   Can travel by private vehicle   No    Equipment Recommendations None recommended by PT     Functional Status Assessment Patient has had a recent decline in their functional status and demonstrates the ability to make significant improvements in function in a reasonable and predictable amount of time.     Precautions /  Restrictions Precautions Precautions: Fall Recall of Precautions/Restrictions: Intact Precaution/Restrictions Comments: watch HR Restrictions Weight Bearing Restrictions Per Provider Order: No      Mobility  Bed Mobility  General bed mobility comments: in recliner upon arrival    Transfers Overall transfer level: Needs assistance Equipment used: Rolling walker (2 wheels) Transfers: Sit to/from Stand Sit to Stand: Max assist    General transfer comment: MaxA for boost up with increased effort and time. x2 attempts before pt was able to complete stand.    Ambulation/Gait Ambulation/Gait assistance: Contact guard assist, Min assist Gait Distance (Feet): 30 Feet Assistive device: Rolling walker (2 wheels) Gait Pattern/deviations: Step-through pattern, Decreased stride length, Trunk flexed Gait velocity: decr     General Gait Details: CGA for safety, increased fatigue after ambulating ~20 ft        Balance Overall balance assessment: Needs assistance Sitting-balance support: No upper extremity supported Sitting balance-Leahy Scale: Good     Standing balance support: Bilateral upper extremity supported, During functional activity Standing balance-Leahy Scale: Poor Standing balance comment: reliant on RW        Pertinent Vitals/Pain Pain Assessment Pain Assessment: No/denies pain    Home Living Family/patient expects to be discharged to:: Skilled nursing facility Living Arrangements: Alone Available Help at Discharge: Available PRN/intermittently;Family Type of Home: House      Alternate Level Stairs-Number of Steps: 14 with chair lift. Home Layout: Two level;1/2 bath on main level Home Equipment: Grab bars - tub/shower;Rolling Walker (2 wheels) Additional Comments: Chair Lift to second floor with full bathroom and bedroom up. Thinks daughter may have a shower chair  from pt's late husband.    Prior Function Prior Level of Function : Driving;Needs assist       Physical Assist : Mobility (physical);ADLs (physical) Mobility (physical): Transfers ADLs (physical): Bathing;Dressing Mobility Comments: was using RW at SNF, needed assist to stand ADLs Comments: needed assist at rehab to bath and dress     Extremity/Trunk Assessment   Upper Extremity Assessment Upper Extremity Assessment: Defer to OT evaluation    Lower Extremity Assessment Lower Extremity Assessment: Generalized weakness    Cervical / Trunk Assessment Cervical / Trunk Assessment: Kyphotic  Communication   Communication Communication: No apparent difficulties    Cognition Arousal: Alert Behavior During Therapy: WFL for tasks assessed/performed   PT - Cognitive impairments: No apparent impairments    Following commands: Intact       Cueing Cueing Techniques: Verbal cues      PT Assessment Patient needs continued PT services  PT Problem List Decreased mobility;Decreased activity tolerance;Decreased strength;Decreased balance;Decreased knowledge of use of DME       PT Treatment Interventions DME instruction;Gait training;Balance training;Functional mobility training;Therapeutic exercise;Therapeutic activities;Patient/family education;Stair training    PT Goals (Current goals can be found in the Care Plan section)  Acute Rehab PT Goals Patient Stated Goal: to continue working on being able to stand with less assistance PT Goal Formulation: With patient Time For Goal Achievement: 01/10/24 Potential to Achieve Goals: Good    Frequency Min 1X/week        AM-PAC PT "6 Clicks" Mobility  Outcome Measure Help needed turning from your back to your side while in a flat bed without using bedrails?: A Little Help needed moving from lying on your back to sitting on the side of a flat bed without using bedrails?: A Little Help needed moving to and from a bed to a chair (including a wheelchair)?: A Lot Help needed standing up from a chair using your arms (e.g.,  wheelchair or bedside chair)?: A Lot Help needed to walk in hospital room?: A Little Help needed climbing 3-5 steps with a railing? : Total 6 Click Score: 14    End of Session   Activity Tolerance: Patient limited by fatigue Patient left: in chair;with call bell/phone within reach;with family/visitor present Nurse Communication: Mobility status PT Visit Diagnosis: Other abnormalities of gait and mobility (R26.89);Muscle weakness (generalized) (M62.81)    Time: 1610-9604 PT Time Calculation (min) (ACUTE ONLY): 14 min   Charges:   PT Evaluation $PT Eval Low Complexity: 1 Low   PT General Charges $$ ACUTE PT VISIT: 1 Visit       Hilton Cork, PT, DPT Secure Chat Preferred  Rehab Office [phone]   Arturo Morton Brion Aliment 12/27/2023, 12:53 PM

## 2023-12-27 NOTE — Progress Notes (Signed)
   12/26/23 0757  Infection/Precautions Assessment - Complete every shift  (For questions, contact infection prevention at [phone].  On nights/weekends call [phone].)   See Sidebar for Inpatient Isolation Quick Reference Guide  Enteric Loose, watery diarrhea (3 or more in 24 hours);Notified by ID/IP to initiate enteric precautions  Enter Name of Inf. Disease/ Inf. Prev. Leighton Roach  Protective Per provider order  Contact Enteric Manson Passey) Precautions Continued  Protective Precautions Continued  [REMOVED] Protective Precautions  Removal Date/Time: 11/27/23 1622  Placement Date/Time: 11/24/23 1622   Reason Airborne D/C'd: Reason initiated - now ruled out  Patient/Family Education Provided Yes

## 2023-12-27 NOTE — Progress Notes (Signed)
 PROGRESS NOTE [name]  ZHY:[id number] DOB: October 05, 1934 DOA: 12/23/2023 PCP: Lorenda Ishihara, MD  Brief Narrative/Hospital Course: 57 yof w/ stage IV breast cancer (widely metastatic to lung, liver, spine, ribs, peritoneum), PAF not on AC, chronic HFpEF, HTN, CKD stage IIIb, anemia, HTN, recent admission 2/28 -3/04 for new onset PAF with RVR AKI on CKD 3B b/l creat 1.1-1.2, elevated troponin who is admitted with A-fib with RVR and acute on chronic HFpEF after seeing primary heart care left heart rate in 150s. In ED- workup showed elevated BNP 2 2 1-2 0.44-53 creatinine slightly up 1.2, hemoglobin 7.7 chronic WBC 12.6 from previous 19.2 on 12/12/23, cbg 304.  Chest x-ray showed interval change of CHF with mild cardiomegaly interval moderate pulmonary edema and moderate-sized bilateral pleural effusions.  Patient was given IV Lasix, Cardizem infusion cardiology consulted and admitted. Seen for leg edema could be from lymphatic plugging and question Metastatic effusion due to metastatic breast cancer. Patient diuresis regimen transition to oral Lasix 3/21.  Seen by oncology given Faslodex 3/21 and plan for outpatient follow-up  Consultation: Cardiology Hematology oncology  Significant imaging/procedure: Limited echo 3/19>> lvef 70-75%, moderate asymmetric LVH , moderately elevated pulmonary artery systolic pressure.   Subjective: Patient seen and examined  She is resting comfortably  She wants to go to rehab   Assessment and Plan: Principal Problem:   Paroxysmal atrial fibrillation with RVR (HCC) Active Problems:   Acute on chronic heart failure with preserved ejection fraction (HFpEF, >= 50%) (HCC)   Hypertension   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)   Chronic anemia   Chronic kidney disease, stage 3b (HCC)   Diastolic CHF, chronic (HCC)   Permanent AFib with RVR:  Recent admission for new onset PAF on 2/28: PTA on Cardizem, Lopressor 25 bid aspirin not on  anticoagulation 2/2 widely metastatic cancer/comorbidities. Complicated with acute on chronic HFpEF.  Rate controlled continue metoprolol, Cardizem, cardiology signed off   Acute on chronic HFpEF Moderate pulmonary edema acute Bilateral pleural effusions: BNP elevated with imaging and clinical features consistent with acute on chronic HFpEF.   Seen by cardiology, managed with IV Lasix > transitioned to p.o. has leg edema likely chronic in the setting of her metastasis. Recent echo with EF 65 to 70%, repeat echo:EF 70-75%  Hypomagnesemia Hypokalemia: Likely from holosystolic chronic.  Replace K on kdur 40 daily.  Anemia of chronic disease Mild thrombocytopenia-likely from acute illness: Hemoglobin 7.7 on admission.Baseline around 7-8. Stable ~ 8.  Monitor and transfuse if symptomatic   Metastatic breast cancer: Widely metastatic breast cancer, follows with Dr. Truett Perna.  S/p Falsodex 3/21 holing Hydrea. Seen by Dr. Myrle Sheng  3/21   CKD stage IIIb: Renal function is stable.  Monitor closely while on diuretics, continue her oral bicarb. Recent Labs    12/06/23 0424 12/07/23 0401 12/08/23 0508 12/09/23 0659 12/12/23 1300 12/23/23 1650 12/24/23 0231 12/25/23 0312 12/26/23 0843 12/27/23 0310  BUN 41* 43* 41* 42* 41* 40* 41* 39* 34* 32*  CREATININE 1.50* 1.54* 1.41* 1.32* 1.09* 1.21* 1.27* 1.27* 1.11* 1.13*  CO2 14* 14* 15* 18* 22 23 22 26 25 27   K 4.2 4.6 4.2 4.3 5.1 4.4 3.8 3.1* 3.4* 3.4*   Hypertension: BP controlled continue oral Cardizem and metoprolol, Imdur.   Leukocytosis: RESOLVED, likley from recent steroid.  Monitor  Chronic diarrhea C diff Ag + toxin pcr neg: Patient reports he gets 1-3 loose bowel movement daily and not new.  Discussed with Dr. Thedore Mins do not recommend treatment  at this time.  Goals of care-CODE STATUS: DNR limited.Overall prognosis is poor.  DVT prophylaxis: none Code Status:   Code Status: Limited: Do not attempt resuscitation (DNR)  -DNR-LIMITED -Do Not Intubate/DNI  Family Communication: plan of care discussed with patient at bedside. Patient status is: Remains hospitalized because of severity of illness Level of care: Progressive   Dispo:The patient is from: home.Lives alone her sister and daughter nearby            Anticipated disposition:Pending PT OT evaluation for dispo.  Objective: Vitals last 24 hrs: Vitals:   12/27/23 0308 12/27/23 0604 12/27/23 0607 12/27/23 0809  BP: (!) 142/62  (!) 156/49 (!) 128/51  Pulse: 84  96 87  Resp: 20 (!) 33 15   Temp: (!) 97.5 F (36.4 C)  (!) 97.2 F (36.2 C) (!) 97.3 F (36.3 C)  TempSrc: Oral  Oral Oral  SpO2: 97%  96% 96%  Weight:  53.7 kg    Height:       Weight change: 0.181 kg  Physical Examination: General exam: alert awake, oriented at baseline, older than stated age HEENT:Oral mucosa moist, Ear/Nose WNL grossly Respiratory system: Bilaterally clear BS,no use of accessory muscle Cardiovascular system: S1 & S2 +, No JVD. Gastrointestinal system: Abdomen soft,NT,ND, BS+ Nervous System: Alert, awake, moving all extremities,and following commands. Extremities: LE edema +,distal peripheral pulses palpable and warm.  Skin: No rashes,no icterus. MSK: Normal muscle bulk,tone, power   Medications reviewed:  Scheduled Meds:  aspirin EC  81 mg Oral Daily   cholecalciferol  2,000 Units Oral BID   cyanocobalamin  1,000 mcg Oral Daily   diltiazem  60 mg Oral Q6H   furosemide  40 mg Oral Daily   hydroxychloroquine  200 mg Oral Daily   isosorbide mononitrate  60 mg Oral Daily   magnesium oxide  400 mg Oral BID   metoprolol tartrate  25 mg Oral BID   pantoprazole  20 mg Oral Daily   potassium chloride  40 mEq Oral Daily   rosuvastatin  10 mg Oral QHS   sodium bicarbonate  650 mg Oral BID   sodium chloride flush  3 mL Intravenous Q12H  Continuous Infusions: Diet Order             Diet Heart Room service appropriate? Yes; Fluid consistency: Thin  Diet  effective now                  Intake/Output Summary (Last 24 hours) at 12/27/2023 1048 Last data filed at 12/26/2023 2300 Gross per 24 hour  Intake 180 ml  Output --  Net 180 ml   Net IO Since Admission: -2,649.22 mL [12/27/23 1048]  Wt Readings from Last 3 Encounters:  12/27/23 53.7 kg  12/23/23 52.2 kg  12/05/23 52.1 kg     Unresulted Labs (From admission, onward)     Start     Ordered   12/25/23 0500  Basic metabolic panel  Daily,   R      12/24/23 0805   12/25/23 0500  CBC  Daily,   R      12/24/23 0805          Data Reviewed: I have personally reviewed following labs and imaging studies CBC: Recent Labs  Lab 12/23/23 1650 12/24/23 0231 12/25/23 0312 12/26/23 0843 12/27/23 0310  WBC 12.6* 11.4* 9.5 8.2 6.2  HGB 7.7* 7.4* 8.7* 8.2* 7.6*  HCT 24.4* 23.5* 27.2* 24.8* 23.1*  MCV 104.3* 104.0* 103.0* 101.6* 101.3*  PLT 197 164 158 122* 115*   Basic Metabolic Panel:  Recent Labs  Lab 12/23/23 1650 12/24/23 0231 12/25/23 0312 12/26/23 0843 12/27/23 0310  NA 143 135 139 141 139  K 4.4 3.8 3.1* 3.4* 3.4*  CL 115* 107 106 107 106  CO2 23 22 26 25 27   GLUCOSE 172* 304* 149* 93 119*  BUN 40* 41* 39* 34* 32*  CREATININE 1.21* 1.27* 1.27* 1.11* 1.13*  CALCIUM 8.0* 7.7* 7.8* 7.7* 7.6*  MG  --  1.4* 1.8 1.5*  --    GFR: Estimated Creatinine Clearance: 29.7 mL/min (A) (by C-G formula based on SCr of 1.13 mg/dL (H)). Recent Results (from the past 240 hours)  C Difficile Quick Screen w PCR reflex     Status: Abnormal   Collection Time: 12/25/23  8:59 AM   Specimen: STOOL  Result Value Ref Range Status   C Diff antigen POSITIVE (A) NEGATIVE Final   C Diff toxin NEGATIVE NEGATIVE Final   C Diff interpretation Results are indeterminate. See PCR results.  Final    Comment: Performed at [facility], [address], [place], [place] [address]  C. Diff by PCR, Reflexed     Status: None   Collection Time: 12/25/23  8:59 AM  Result Value Ref Range Status    Toxigenic C. Difficile by PCR NEGATIVE NEGATIVE Final    Comment: Patient is colonized with non toxigenic C. difficile. May not need treatment unless significant symptoms are present. Performed at [facility], [address], [place], [place] [address]     Antimicrobials/Microbiology: Anti-infectives (From admission, onward)    Start     Dose/Rate Route Frequency Ordered Stop   12/25/23 1430  hydroxychloroquine (PLAQUENIL) tablet 200 mg        200 mg Oral Daily 12/25/23 1334           Component Value Date/Time   SDES WOUND 02/02/2022 1419   SPECREQUEST RIGHT HAND ABSCESS 02/02/2022 1419   CULT  02/02/2022 1419    RARE PASTEURELLA MULTOCIDA Usually susceptible to penicillin and other beta lactam agents,quinolones,macrolides and tetracyclines. NO ANAEROBES ISOLATED Performed at [facility], [address], [place], [place] [address]    REPTSTATUS 02/07/2022 FINAL 02/02/2022 1419  Radiology Studies: No results found.  LOS: 4 days   Total time spent in review of labs and imaging, patient evaluation, formulation of plan, documentation and communication with family: 35 minutes Lanae Boast, MD  [facility] 12/27/2023, 10:48 AM

## 2023-12-28 DIAGNOSIS — I480 Paroxysmal atrial fibrillation: Secondary | ICD-10-CM | POA: Diagnosis not present

## 2023-12-28 LAB — CBC
HCT: 22.5 % — ABNORMAL LOW (ref 36.0–46.0)
Hemoglobin: 7.4 g/dL — ABNORMAL LOW (ref 12.0–15.0)
MCH: 33.6 pg (ref 26.0–34.0)
MCHC: 32.9 g/dL (ref 30.0–36.0)
MCV: 102.3 fL — ABNORMAL HIGH (ref 80.0–100.0)
Platelets: 121 10*3/uL — ABNORMAL LOW (ref 150–400)
RBC: 2.2 MIL/uL — ABNORMAL LOW (ref 3.87–5.11)
RDW: 17 % — ABNORMAL HIGH (ref 11.5–15.5)
WBC: 5.8 10*3/uL (ref 4.0–10.5)
nRBC: 0 % (ref 0.0–0.2)

## 2023-12-28 LAB — HEMOGLOBIN AND HEMATOCRIT, BLOOD
HCT: 26.1 % — ABNORMAL LOW (ref 36.0–46.0)
Hemoglobin: 8.5 g/dL — ABNORMAL LOW (ref 12.0–15.0)

## 2023-12-28 LAB — BASIC METABOLIC PANEL
Anion gap: 6 (ref 5–15)
BUN: 28 mg/dL — ABNORMAL HIGH (ref 8–23)
CO2: 26 mmol/L (ref 22–32)
Calcium: 7.4 mg/dL — ABNORMAL LOW (ref 8.9–10.3)
Chloride: 107 mmol/L (ref 98–111)
Creatinine, Ser: 1.16 mg/dL — ABNORMAL HIGH (ref 0.44–1.00)
GFR, Estimated: 46 mL/min — ABNORMAL LOW (ref >60)
Glucose, Bld: 115 mg/dL — ABNORMAL HIGH (ref 70–99)
Potassium: 3.3 mmol/L — ABNORMAL LOW (ref 3.5–5.1)
Sodium: 139 mmol/L (ref 135–145)

## 2023-12-28 MED ORDER — POTASSIUM CHLORIDE CRYS ER 20 MEQ PO TBCR
40.0000 meq | EXTENDED_RELEASE_TABLET | Freq: Once | ORAL | Status: AC
  Administered 2023-12-28: 40 meq via ORAL
  Filled 2023-12-28: qty 2

## 2023-12-28 NOTE — Progress Notes (Addendum)
 PROGRESS NOTE [name]  WUJ:[id number] DOB: Apr 06, 1934 DOA: 12/23/2023 PCP: Lorenda Ishihara, MD  Brief Narrative/Hospital Course: 36 yof w/ stage IV breast cancer (widely metastatic to lung, liver, spine, ribs, peritoneum), PAF not on AC, chronic HFpEF, HTN, CKD stage IIIb, anemia, HTN, recent admission 2/28 -3/04 for new onset PAF with RVR AKI on CKD 3B b/l creat 1.1-1.2, elevated troponin who is admitted with A-fib with RVR and acute on chronic HFpEF after seeing primary heart care left heart rate in 150s. In ED- workup showed elevated BNP 2 2 1-2 0.44-53 creatinine slightly up 1.2, hemoglobin 7.7 chronic WBC 12.6 from previous 19.2 on 12/12/23, cbg 304.  Chest x-ray showed interval change of CHF with mild cardiomegaly interval moderate pulmonary edema and moderate-sized bilateral pleural effusions.  Patient was given IV Lasix, Cardizem infusion cardiology consulted and admitted. Seen for leg edema could be from lymphatic plugging and question Metastatic effusion due to metastatic breast cancer. Patient diuresis regimen transition to oral Lasix 3/21.  Seen by oncology given Faslodex 3/21 and plan for outpatient follow-up.  PT OT recommending skilled nursing facility.  Consultation: Cardiology Hematology oncology  Significant imaging/procedure: Limited echo 3/19>> lvef 70-75%, moderate asymmetric LVH , moderately elevated pulmonary artery systolic pressure.   Subjective: Seen and examined  Resting comfortably on the bedside chair mild leg edema chronic  Feels somewhat fatigued    Assessment and Plan:  Persistentl Afib: Admitted with RVR,recent admission for new onset PAF on 2/28.  Seen by cardiology> plan is to continue rate control oral Cardizem and transition to long-acting on discharge, continue Lopressor, aspirin.  Not on anticoagulation due to metastatic disease.  Acute on chronic HFpEF Moderate pulmonary edema acute Bilateral pleural effusions: BNP elevated with  imaging and clinical features consistent with acute on chronic HFpEF.   Seen by cardiology, managed with IV Lasix > transitioned to p.o lasix and doing well, does have leg edema likely chronic in the setting of her metastasis. Recent echo with EF 65 to 70%, repeat echo:EF 70-75%   Hypomagnesemia Hypokalemia: Replaced potassium aggressively this morning w/ extra 40 kcl,follow-up outpatient.  Recheck in the morning   Anemia of chronic disease Mild thrombocytopenia-likely from acute illness: Hemoglobin 7.7 on admission.Baseline around 7-8.  Recheck hb at 8.5gm.  Monitor outpatient.    Metastatic breast cancer: Widely metastatic breast cancer, follows with Dr. Truett Perna.  S/p Falsodex 3/21 holing Hydrea. Seen by Dr. Myrle Sheng  3/21   CKD stage IIIb: Remains stable at 1.1. Continue to monitor intermittently while on diuretics continue oral bicarb.   Hypertension: BP controlled continue oral Cardizem and metoprolol, Imdur.    Leukocytosis: RESOLVED, likley from recent steroid.  Monitor   Chronic diarrhea C diff Ag + toxin pcr neg: Patient reports he gets 1-3 loose bowel movement daily and not new.  Discussed with Dr. Thedore Mins do not recommend treatment at this time.   Goals of care-CODE STATUS: DNR limited.Overall prognosis is poor  DVT prophylaxis: none Code Status:   Code Status: Limited: Do not attempt resuscitation (DNR) -DNR-LIMITED -Do Not Intubate/DNI  Family Communication: plan of care discussed with patient at bedside. Patient status is: Remains hospitalized because of severity of illness Level of care: Progressive   Dispo:The patient is from: home.Lives alone her sister and daughter nearby            Anticipated disposition: Awaiting skilled nursing facility bed will be available 3/24   Objective: Vitals last 24 hrs: Vitals:   12/28/23 0540 12/28/23 0756 12/28/23 0800  12/28/23 1154  BP:  (!) 153/75 (!) 156/63   Pulse:  85  82  Resp:  16    Temp:  98 F (36.7 C)  97.9 F  (36.6 C)  TempSrc:  Oral  Oral  SpO2:  99%    Weight: 54.9 kg     Height:       Weight change: 1.239 kg  Physical Examination: General exam: alert awake, oriented at baseline, older than stated age HEENT:Oral mucosa moist, Ear/Nose WNL grossly Respiratory system: Bilaterally clear BS,no use of accessory muscle Cardiovascular system: S1 & S2 +, No JVD. Gastrointestinal system: Abdomen soft,NT,ND, BS+ Nervous System: Alert, awake, moving all extremities,and following commands. Extremities: LE edema +,distal peripheral pulses palpable and warm.  Skin: No rashes,no icterus. MSK: Normal muscle bulk,tone, power   Medications reviewed:  Scheduled Meds:  aspirin EC  81 mg Oral Daily   cholecalciferol  2,000 Units Oral BID   cyanocobalamin  1,000 mcg Oral Daily   diltiazem  60 mg Oral Q6H   furosemide  40 mg Oral Daily   hydroxychloroquine  200 mg Oral Daily   isosorbide mononitrate  60 mg Oral Daily   magnesium oxide  400 mg Oral BID   metoprolol tartrate  25 mg Oral BID   pantoprazole  20 mg Oral Daily   potassium chloride  40 mEq Oral Daily   rosuvastatin  10 mg Oral QHS   sodium bicarbonate  650 mg Oral BID   sodium chloride flush  3 mL Intravenous Q12H  Continuous Infusions: Diet Order             Diet Heart Room service appropriate? Yes; Fluid consistency: Thin  Diet effective now                  Intake/Output Summary (Last 24 hours) at 12/28/2023 1438 Last data filed at 12/28/2023 0145 Gross per 24 hour  Intake 3 ml  Output 440 ml  Net -437 ml   Net IO Since Admission: -3,386.22 mL [12/28/23 1438]  Wt Readings from Last 3 Encounters:  12/28/23 54.9 kg  12/23/23 52.2 kg  12/05/23 52.1 kg     Unresulted Labs (From admission, onward)     Start     Ordered   12/25/23 0500  Basic metabolic panel  Daily,   R      12/24/23 0805   12/25/23 0500  CBC  Daily,   R      12/24/23 0805          Data Reviewed: I have personally reviewed following labs and  imaging studies CBC: Recent Labs  Lab 12/24/23 0231 12/25/23 0312 12/26/23 0843 12/27/23 0310 12/28/23 0306 12/28/23 1201  WBC 11.4* 9.5 8.2 6.2 5.8  --   HGB 7.4* 8.7* 8.2* 7.6* 7.4* 8.5*  HCT 23.5* 27.2* 24.8* 23.1* 22.5* 26.1*  MCV 104.0* 103.0* 101.6* 101.3* 102.3*  --   PLT 164 158 122* 115* 121*  --    Basic Metabolic Panel:  Recent Labs  Lab 12/24/23 0231 12/25/23 0312 12/26/23 0843 12/27/23 0310 12/28/23 0306  NA 135 139 141 139 139  K 3.8 3.1* 3.4* 3.4* 3.3*  CL 107 106 107 106 107  CO2 22 26 25 27 26   GLUCOSE 304* 149* 93 119* 115*  BUN 41* 39* 34* 32* 28*  CREATININE 1.27* 1.27* 1.11* 1.13* 1.16*  CALCIUM 7.7* 7.8* 7.7* 7.6* 7.4*  MG 1.4* 1.8 1.5*  --   --    GFR: Estimated  Creatinine Clearance: 29.6 mL/min (A) (by C-G formula based on SCr of 1.16 mg/dL (H)). Recent Results (from the past 240 hours)  C Difficile Quick Screen w PCR reflex     Status: Abnormal   Collection Time: 12/25/23  8:59 AM   Specimen: STOOL  Result Value Ref Range Status   C Diff antigen POSITIVE (A) NEGATIVE Final   C Diff toxin NEGATIVE NEGATIVE Final   C Diff interpretation Results are indeterminate. See PCR results.  Final    Comment: Performed at [facility], [address], [place], [place] [address]  C. Diff by PCR, Reflexed     Status: None   Collection Time: 12/25/23  8:59 AM  Result Value Ref Range Status   Toxigenic C. Difficile by PCR NEGATIVE NEGATIVE Final    Comment: Patient is colonized with non toxigenic C. difficile. May not need treatment unless significant symptoms are present. Performed at [facility], [address], [place], [place] [address]     Antimicrobials/Microbiology: Anti-infectives (From admission, onward)    Start     Dose/Rate Route Frequency Ordered Stop   12/25/23 1430  hydroxychloroquine (PLAQUENIL) tablet 200 mg        200 mg Oral Daily 12/25/23 1334           Component Value Date/Time   SDES WOUND 02/02/2022 1419    SPECREQUEST RIGHT HAND ABSCESS 02/02/2022 1419   CULT  02/02/2022 1419    RARE PASTEURELLA MULTOCIDA Usually susceptible to penicillin and other beta lactam agents,quinolones,macrolides and tetracyclines. NO ANAEROBES ISOLATED Performed at [facility], [address] [place], [place] [address]    REPTSTATUS 02/07/2022 FINAL 02/02/2022 1419  Radiology Studies: No results found.  LOS: 5 days   Total time spent in review of labs and imaging, patient evaluation, formulation of plan, documentation and communication with family: 35 minutes Lanae Boast, MD  [facility] 12/28/2023, 2:38 PM

## 2023-12-28 NOTE — TOC Progression Note (Signed)
 Transition of Care [facility]) - Progression Note    Patient Details  Name: [name] MRN: [id number] Date of Birth: Sep 25, 1934  [facility]) CM/SW Contact  Patrice Paradise, LCSW Phone Number: 12/28/2023, 10:28 AM  Clinical Narrative:     CSW spoke with Lowella Bandy at [facility] in regards to SNF bed. Lowella Bandy explained that she will discharges tomorrow therefore will have a SNF bed for pt. CSW confirmed that pt will need H&P and DC summary to return. MD updated.  TOC team will continue to assist with discharge planning needs.    Expected Discharge Plan: Skilled Nursing Facility Barriers to Discharge: Continued Medical Work up  Expected Discharge Plan and Services                                               Social Determinants of Health (SDOH) Interventions SDOH Screenings   Food Insecurity: No Food Insecurity (12/23/2023)  Housing: Low Risk  (12/23/2023)  Transportation Needs: No Transportation Needs (12/23/2023)  Utilities: Not At Risk (12/23/2023)  Depression (PHQ2-9): Low Risk  (11/11/2023)  Social Connections: Moderately Isolated (12/23/2023)  Tobacco Use: Low Risk  (12/23/2023)    Readmission Risk Interventions    12/09/2023   12:21 PM 11/23/2023   11:56 AM  Readmission Risk Prevention Plan  Transportation Screening Complete Complete  PCP or Specialist Appt within 5-7 Days  Complete  Home Care Screening  Complete  Medication Review (RN CM)  Complete  Skilled Nursing Facility Complete

## 2023-12-29 ENCOUNTER — Encounter (HOSPITAL_COMMUNITY): Payer: Self-pay | Admitting: *Deleted

## 2023-12-29 DIAGNOSIS — K529 Noninfective gastroenteritis and colitis, unspecified: Secondary | ICD-10-CM | POA: Diagnosis present

## 2023-12-29 DIAGNOSIS — C786 Secondary malignant neoplasm of retroperitoneum and peritoneum: Secondary | ICD-10-CM | POA: Diagnosis present

## 2023-12-29 DIAGNOSIS — Z8543 Personal history of malignant neoplasm of ovary: Secondary | ICD-10-CM | POA: Diagnosis not present

## 2023-12-29 DIAGNOSIS — I509 Heart failure, unspecified: Secondary | ICD-10-CM | POA: Diagnosis not present

## 2023-12-29 DIAGNOSIS — J810 Acute pulmonary edema: Secondary | ICD-10-CM | POA: Diagnosis not present

## 2023-12-29 DIAGNOSIS — K219 Gastro-esophageal reflux disease without esophagitis: Secondary | ICD-10-CM | POA: Diagnosis present

## 2023-12-29 DIAGNOSIS — Z5111 Encounter for antineoplastic chemotherapy: Secondary | ICD-10-CM | POA: Diagnosis present

## 2023-12-29 DIAGNOSIS — E43 Unspecified severe protein-calorie malnutrition: Secondary | ICD-10-CM | POA: Diagnosis not present

## 2023-12-29 DIAGNOSIS — E8809 Other disorders of plasma-protein metabolism, not elsewhere classified: Secondary | ICD-10-CM | POA: Diagnosis present

## 2023-12-29 DIAGNOSIS — R627 Adult failure to thrive: Secondary | ICD-10-CM | POA: Diagnosis not present

## 2023-12-29 DIAGNOSIS — N1831 Chronic kidney disease, stage 3a: Secondary | ICD-10-CM | POA: Diagnosis present

## 2023-12-29 DIAGNOSIS — N179 Acute kidney failure, unspecified: Secondary | ICD-10-CM | POA: Diagnosis not present

## 2023-12-29 DIAGNOSIS — L89320 Pressure ulcer of left buttock, unstageable: Secondary | ICD-10-CM | POA: Diagnosis not present

## 2023-12-29 DIAGNOSIS — Z85828 Personal history of other malignant neoplasm of skin: Secondary | ICD-10-CM | POA: Diagnosis not present

## 2023-12-29 DIAGNOSIS — R531 Weakness: Secondary | ICD-10-CM | POA: Diagnosis not present

## 2023-12-29 DIAGNOSIS — D631 Anemia in chronic kidney disease: Secondary | ICD-10-CM | POA: Diagnosis not present

## 2023-12-29 DIAGNOSIS — R0789 Other chest pain: Secondary | ICD-10-CM | POA: Diagnosis not present

## 2023-12-29 DIAGNOSIS — R0989 Other specified symptoms and signs involving the circulatory and respiratory systems: Secondary | ICD-10-CM | POA: Diagnosis not present

## 2023-12-29 DIAGNOSIS — I4819 Other persistent atrial fibrillation: Secondary | ICD-10-CM | POA: Diagnosis not present

## 2023-12-29 DIAGNOSIS — I2489 Other forms of acute ischemic heart disease: Secondary | ICD-10-CM | POA: Diagnosis present

## 2023-12-29 DIAGNOSIS — Z66 Do not resuscitate: Secondary | ICD-10-CM | POA: Diagnosis present

## 2023-12-29 DIAGNOSIS — R21 Rash and other nonspecific skin eruption: Secondary | ICD-10-CM | POA: Diagnosis not present

## 2023-12-29 DIAGNOSIS — D849 Immunodeficiency, unspecified: Secondary | ICD-10-CM | POA: Diagnosis not present

## 2023-12-29 DIAGNOSIS — I5031 Acute diastolic (congestive) heart failure: Secondary | ICD-10-CM | POA: Diagnosis not present

## 2023-12-29 DIAGNOSIS — D696 Thrombocytopenia, unspecified: Secondary | ICD-10-CM | POA: Diagnosis present

## 2023-12-29 DIAGNOSIS — E785 Hyperlipidemia, unspecified: Secondary | ICD-10-CM | POA: Diagnosis present

## 2023-12-29 DIAGNOSIS — I4811 Longstanding persistent atrial fibrillation: Secondary | ICD-10-CM | POA: Diagnosis present

## 2023-12-29 DIAGNOSIS — Z7401 Bed confinement status: Secondary | ICD-10-CM | POA: Diagnosis not present

## 2023-12-29 DIAGNOSIS — R1312 Dysphagia, oropharyngeal phase: Secondary | ICD-10-CM | POA: Diagnosis not present

## 2023-12-29 DIAGNOSIS — C7981 Secondary malignant neoplasm of breast: Secondary | ICD-10-CM | POA: Diagnosis not present

## 2023-12-29 DIAGNOSIS — M6281 Muscle weakness (generalized): Secondary | ICD-10-CM | POA: Diagnosis not present

## 2023-12-29 DIAGNOSIS — D649 Anemia, unspecified: Secondary | ICD-10-CM | POA: Diagnosis not present

## 2023-12-29 DIAGNOSIS — R2681 Unsteadiness on feet: Secondary | ICD-10-CM | POA: Diagnosis not present

## 2023-12-29 DIAGNOSIS — Z515 Encounter for palliative care: Secondary | ICD-10-CM | POA: Diagnosis not present

## 2023-12-29 DIAGNOSIS — R2689 Other abnormalities of gait and mobility: Secondary | ICD-10-CM | POA: Diagnosis not present

## 2023-12-29 DIAGNOSIS — D539 Nutritional anemia, unspecified: Secondary | ICD-10-CM | POA: Diagnosis present

## 2023-12-29 DIAGNOSIS — R000 Tachycardia, unspecified: Secondary | ICD-10-CM | POA: Diagnosis not present

## 2023-12-29 DIAGNOSIS — M069 Rheumatoid arthritis, unspecified: Secondary | ICD-10-CM | POA: Diagnosis not present

## 2023-12-29 DIAGNOSIS — T502X5A Adverse effect of carbonic-anhydrase inhibitors, benzothiadiazides and other diuretics, initial encounter: Secondary | ICD-10-CM | POA: Diagnosis present

## 2023-12-29 DIAGNOSIS — C50412 Malignant neoplasm of upper-outer quadrant of left female breast: Secondary | ICD-10-CM | POA: Diagnosis not present

## 2023-12-29 DIAGNOSIS — D61818 Other pancytopenia: Secondary | ICD-10-CM | POA: Diagnosis not present

## 2023-12-29 DIAGNOSIS — R002 Palpitations: Secondary | ICD-10-CM | POA: Diagnosis not present

## 2023-12-29 DIAGNOSIS — Z1501 Genetic susceptibility to malignant neoplasm of breast: Secondary | ICD-10-CM | POA: Diagnosis not present

## 2023-12-29 DIAGNOSIS — I4892 Unspecified atrial flutter: Secondary | ICD-10-CM | POA: Diagnosis present

## 2023-12-29 DIAGNOSIS — D84821 Immunodeficiency due to drugs: Secondary | ICD-10-CM | POA: Diagnosis not present

## 2023-12-29 DIAGNOSIS — R1313 Dysphagia, pharyngeal phase: Secondary | ICD-10-CM | POA: Diagnosis not present

## 2023-12-29 DIAGNOSIS — R7989 Other specified abnormal findings of blood chemistry: Secondary | ICD-10-CM | POA: Diagnosis not present

## 2023-12-29 DIAGNOSIS — I351 Nonrheumatic aortic (valve) insufficiency: Secondary | ICD-10-CM | POA: Diagnosis present

## 2023-12-29 DIAGNOSIS — I5033 Acute on chronic diastolic (congestive) heart failure: Secondary | ICD-10-CM | POA: Diagnosis not present

## 2023-12-29 DIAGNOSIS — I4891 Unspecified atrial fibrillation: Secondary | ICD-10-CM | POA: Diagnosis not present

## 2023-12-29 DIAGNOSIS — I480 Paroxysmal atrial fibrillation: Secondary | ICD-10-CM | POA: Diagnosis not present

## 2023-12-29 DIAGNOSIS — I130 Hypertensive heart and chronic kidney disease with heart failure and stage 1 through stage 4 chronic kidney disease, or unspecified chronic kidney disease: Secondary | ICD-10-CM | POA: Diagnosis present

## 2023-12-29 DIAGNOSIS — C787 Secondary malignant neoplasm of liver and intrahepatic bile duct: Secondary | ICD-10-CM | POA: Diagnosis not present

## 2023-12-29 DIAGNOSIS — Z853 Personal history of malignant neoplasm of breast: Secondary | ICD-10-CM | POA: Diagnosis not present

## 2023-12-29 DIAGNOSIS — C7951 Secondary malignant neoplasm of bone: Secondary | ICD-10-CM | POA: Diagnosis not present

## 2023-12-29 DIAGNOSIS — I422 Other hypertrophic cardiomyopathy: Secondary | ICD-10-CM | POA: Diagnosis present

## 2023-12-29 DIAGNOSIS — I10 Essential (primary) hypertension: Secondary | ICD-10-CM | POA: Diagnosis not present

## 2023-12-29 DIAGNOSIS — Z1509 Genetic susceptibility to other malignant neoplasm: Secondary | ICD-10-CM | POA: Diagnosis not present

## 2023-12-29 DIAGNOSIS — I2510 Atherosclerotic heart disease of native coronary artery without angina pectoris: Secondary | ICD-10-CM | POA: Diagnosis not present

## 2023-12-29 DIAGNOSIS — Z170 Estrogen receptor positive status [ER+]: Secondary | ICD-10-CM | POA: Diagnosis not present

## 2023-12-29 DIAGNOSIS — I129 Hypertensive chronic kidney disease with stage 1 through stage 4 chronic kidney disease, or unspecified chronic kidney disease: Secondary | ICD-10-CM | POA: Diagnosis not present

## 2023-12-29 DIAGNOSIS — J90 Pleural effusion, not elsewhere classified: Secondary | ICD-10-CM | POA: Diagnosis not present

## 2023-12-29 DIAGNOSIS — N1832 Chronic kidney disease, stage 3b: Secondary | ICD-10-CM | POA: Diagnosis not present

## 2023-12-29 DIAGNOSIS — M19042 Primary osteoarthritis, left hand: Secondary | ICD-10-CM | POA: Diagnosis present

## 2023-12-29 DIAGNOSIS — I5032 Chronic diastolic (congestive) heart failure: Secondary | ICD-10-CM | POA: Diagnosis not present

## 2023-12-29 DIAGNOSIS — C7800 Secondary malignant neoplasm of unspecified lung: Secondary | ICD-10-CM | POA: Diagnosis present

## 2023-12-29 DIAGNOSIS — G4700 Insomnia, unspecified: Secondary | ICD-10-CM | POA: Diagnosis not present

## 2023-12-29 DIAGNOSIS — C50919 Malignant neoplasm of unspecified site of unspecified female breast: Secondary | ICD-10-CM | POA: Diagnosis not present

## 2023-12-29 DIAGNOSIS — E876 Hypokalemia: Secondary | ICD-10-CM | POA: Diagnosis not present

## 2023-12-29 LAB — BASIC METABOLIC PANEL
Anion gap: 4 — ABNORMAL LOW (ref 5–15)
BUN: 26 mg/dL — ABNORMAL HIGH (ref 8–23)
CO2: 26 mmol/L (ref 22–32)
Calcium: 7.5 mg/dL — ABNORMAL LOW (ref 8.9–10.3)
Chloride: 110 mmol/L (ref 98–111)
Creatinine, Ser: 1.1 mg/dL — ABNORMAL HIGH (ref 0.44–1.00)
GFR, Estimated: 49 mL/min — ABNORMAL LOW (ref >60)
Glucose, Bld: 122 mg/dL — ABNORMAL HIGH (ref 70–99)
Potassium: 3.9 mmol/L (ref 3.5–5.1)
Sodium: 140 mmol/L (ref 135–145)

## 2023-12-29 MED ORDER — POTASSIUM CHLORIDE 20 MEQ PO PACK: 20.0000 meq | PACK | Freq: Every day | ORAL | Status: DC

## 2023-12-29 MED ORDER — MELATONIN 3 MG PO TABS: 3.0000 mg | ORAL_TABLET | Freq: Every evening | ORAL | Status: DC | PRN

## 2023-12-29 MED ORDER — FUROSEMIDE 40 MG PO TABS: 40.0000 mg | ORAL_TABLET | Freq: Every day | ORAL | Status: DC

## 2023-12-29 NOTE — Care Management Important Message (Signed)
 Important Message  Patient Details  Name: [name] MRN: [id number] Date of Birth: May 20, 1934   Important Message Given:  Yes - Medicare IM     Renie Ora 12/29/2023, 10:39 AM

## 2023-12-29 NOTE — Discharge Summary (Addendum)
 Physician Discharge Summary  [name] ZOX:[id number] DOB: Aug 28, 1934 DOA: 12/23/2023  PCP: Lorenda Ishihara, MD  Admit date: 12/23/2023 Discharge date: 12/29/2023 Recommendations for Outpatient Follow-up:  Follow up with PCP in 1 weeks-call for appointment Please obtain BMP/CBC in one week to check potassium level.  Discharge Dispo: SNF [facility] Discharge Condition: Stable Code Status:   Code Status: Limited: Do not attempt resuscitation (DNR) -DNR-LIMITED -Do Not Intubate/DNI  Diet recommendation:  Diet Order             Diet - low sodium heart healthy           Diet Heart Room service appropriate? Yes; Fluid consistency: Thin  Diet effective now                    Brief/Interim Summary: 89 yof w/ stage IV breast cancer (widely metastatic to lung, liver, spine, ribs, peritoneum), PAF not on AC, chronic HFpEF, HTN, CKD stage IIIb, anemia, HTN, recent admission 2/28 -3/04 for new onset PAF with RVR AKI on CKD 3B b/l creat 1.1-1.2, elevated troponin who is admitted with A-fib with RVR and acute on chronic HFpEF after seeing primary heart care left heart rate in 150s. In ED- workup showed elevated BNP 2 2 1-2 0.44-53 creatinine slightly up 1.2, hemoglobin 7.7 chronic WBC 12.6 from previous 19.2 on 12/12/23, cbg 304.  Chest x-ray showed interval change of CHF with mild cardiomegaly interval moderate pulmonary edema and moderate-sized bilateral pleural effusions.  Patient was given IV Lasix, Cardizem infusion cardiology consulted and admitted. Seen for leg edema could be from lymphatic plugging and question Metastatic effusion due to metastatic breast cancer. Patient diuresis regimen transition to oral Lasix 3/21.  Seen by oncology given Faslodex 3/21 and plan for outpatient follow-up.  PT OT recommending skilled nursing facility. Skilled nursing facility bed available 3/24 for discharge  Consultation: Cardiology Hematology oncology  Significant  imaging/procedure: Limited echo 3/19>> lvef 70-75%, moderate asymmetric LVH , moderately elevated pulmonary artery systolic pressure.   Discharge Diagnoses:   Persistent Afib: Admitted with RVR,recent admission for new onset PAF on 2/28.  Seen by cardiology> plan is to continue rate control oral Cardizem and transition to long-acting on discharge, continue Lopressor, aspirin.  Not on anticoagulation due to metastatic disease.   Acute on chronic HFpEF Moderate pulmonary edema acute Bilateral pleural effusions: BNP elevated with imaging and clinical features consistent with acute on chronic HFpEF.   Seen by cardiology, managed with IV Lasix > transitioned to p.o lasix and doing well, does have leg edema likely chronic in the setting of her metastasis. Recent echo with EF 65 to 70%, repeat echo:EF 70-75%   Hypomagnesemia Hypokalemia: Replaced and resolved. Cont kcl daily   Anemia of chronic disease Mild thrombocytopenia-likely from acute illness: Hemoglobin 7.7 on admission.Baseline around 7-8.  Recheck hb at 8.5gm.Monitor outpatient.    Metastatic breast cancer: Widely metastatic breast cancer, follows with Dr. Truett Perna.  S/p Falsodex 3/21 holing Hydrea. Seen by Dr. Myrle Sheng  3/21   CKD stage IIIb: Remains stable at 1.1. Continue to monitor intermittently while on diuretics continue oral bicarb.    Hypertension: BP controlled continue oral Cardizem and metoprolol, Imdur.    Leukocytosis: RESOLVED, likley from recent steroid.  Monitor   Chronic diarrhea C diff Ag +, toxin pcr neg: Patient reports he gets 1-3 loose bowel movement daily and not new.  Discussed with Dr. Thedore Mins do not recommend treatment at this time.   Goals of care-CODE STATUS: DNR limited.Overall  prognosis is poor   Pressure Ulcer POA on sacrum stage 2: Pressure Injury 12/24/23 Sacrum Medial Stage 2 -  Partial thickness loss of dermis presenting as a shallow open injury with a red, pink wound bed without slough.  (Active)  12/24/23 1239  Location: Sacrum  Location Orientation: Medial  Staging: Stage 2 -  Partial thickness loss of dermis presenting as a shallow open injury with a red, pink wound bed without slough.  Wound Description (Comments):   Present on Admission: Yes  Dressing Type Foam - Lift dressing to assess site every shift 12/28/23 2000   Subjective: Aaox3 no diarreha doing well eager to go to SNF today Daughter at bedside  Discharge Exam: Vitals:   12/29/23 0525 12/29/23 0840  BP: (!) 159/69 (!) 159/70  Pulse:  98  Resp:  (!) 26  Temp:    SpO2:  96%   General: Pt is alert, awake, not in acute distress Cardiovascular: RRR, S1/S2 +, no rubs, no gallops Respiratory: CTA bilaterally, no wheezing, no rhonchi Abdominal: Soft, NT, ND, bowel sounds + Extremities: no edema, no cyanosis  Discharge Instructions  Discharge Instructions     Diet - low sodium heart healthy   Complete by: As directed    Discharge instructions   Complete by: As directed    Please call call MD or return to ER for similar or worsening recurring problem that brought you to hospital or if any fever,nausea/vomiting,abdominal pain, uncontrolled pain, chest pain,  shortness of breath or any other alarming symptoms.  Please follow-up your doctor as instructed in a week time and call the office for appointment.  Please avoid alcohol, smoking, or any other illicit substance and maintain healthy habits including taking your regular medications as prescribed.  You were cared for by a hospitalist during your hospital stay. If you have any questions about your discharge medications or the care you received while you were in the hospital after you are discharged, you can call the unit and ask to speak with the hospitalist on call if the hospitalist that took care of you is not available.  Once you are discharged, your primary care physician will handle any further medical issues. Please note that NO REFILLS for any  discharge medications will be authorized once you are discharged, as it is imperative that you return to your primary care physician (or establish a relationship with a primary care physician if you do not have one) for your aftercare needs so that they can reassess your need for medications and monitor your lab values   Discharge wound care:   Complete by: As directed    Partial thickness loss of dermis presenting as a shallow open injury with a red, pink wound bed without slough Cont off loading   Increase activity slowly   Complete by: As directed       Allergies as of 12/29/2023       Reactions   Ramipril Itching        Medication List     STOP taking these medications    hydroxyurea 500 MG capsule Commonly known as: HYDREA   potassium chloride 10 MEQ tablet Commonly known as: KLOR-CON M       TAKE these medications    aspirin EC 81 MG tablet Take 81 mg by mouth daily.   Belsomra 10 MG Tabs Generic drug: Suvorexant Take 10 mg by mouth at bedtime.   CALTRATE 600+D PO Take 1 tablet by mouth 2 (two) times daily.  cyanocobalamin 1000 MCG tablet Commonly known as: VITAMIN B12 Take 1,000 mcg by mouth daily.   diltiazem 240 MG 24 hr capsule Commonly known as: CARDIZEM CD Take 1 capsule (240 mg total) by mouth daily.   furosemide 40 MG tablet Commonly known as: LASIX Take 1 tablet (40 mg total) by mouth daily.   hydroxychloroquine 200 MG tablet Commonly known as: PLAQUENIL Take 200 mg by mouth daily.   isosorbide mononitrate 60 MG 24 hr tablet Commonly known as: IMDUR Take 1 tablet (60 mg total) by mouth daily.   loperamide 2 MG capsule Commonly known as: IMODIUM Take 1 capsule (2 mg total) by mouth as needed for diarrhea or loose stools.   melatonin 3 MG Tabs tablet Take 1 tablet (3 mg total) by mouth at bedtime as needed.   metoprolol tartrate 25 MG tablet Commonly known as: LOPRESSOR Take 1 tablet (25 mg total) by mouth 2 (two) times  daily. What changed:  how much to take when to take this additional instructions   Multivitamin Women 50+ Tabs Take 1 tablet by mouth daily with breakfast. Decubi-Vite   pantoprazole 20 MG tablet Commonly known as: PROTONIX Take 1 tablet (20 mg total) by mouth daily.   potassium chloride 20 MEQ packet Commonly known as: KLOR-CON Take 20 mEq by mouth daily.   Pro-Stat Liqd Take 30 mLs by mouth in the morning and at bedtime.   Proctozone-HC 2.5 % rectal cream Generic drug: hydrocortisone PLACE 1 APPLICATION RECTALLY 2 TIMES A DAY AS NEEDED FOR HEMORRHOIDS. What changed: See the new instructions.   rosuvastatin 10 MG tablet Commonly known as: CRESTOR Take 10 mg by mouth at bedtime.   sodium bicarbonate 650 MG tablet Take 1 tablet (650 mg total) by mouth 2 (two) times daily.   Sonafine Apply 1 Application topically 2 (two) times daily as needed (Apply to areas of redness from radiation as needed. Call Dr. Colletta Maryland nurse at [phone] if you have any questions).   sucralfate 1 GM/10ML suspension Commonly known as: CARAFATE Take 10 mLs (1 g total) by mouth 4 (four) times daily -  with meals and at bedtime. What changed: when to take this   tiZANidine 4 MG tablet Commonly known as: ZANAFLEX Take 4 mg by mouth 2 (two) times daily as needed for muscle spasms.   triamcinolone cream 0.1 % Commonly known as: KENALOG Apply 1 application  topically as needed (for rashes- affected areas).   Vitamin D3 50 MCG (2000 UT) Tabs Take 2,000 Units by mouth in the morning and at bedtime.               Discharge Care Instructions  (From admission, onward)           Start     Ordered   12/29/23 0000  Discharge wound care:       Comments: Partial thickness loss of dermis presenting as a shallow open injury with a red, pink wound bed without slough Cont off loading   12/29/23 0920            Follow-up Information     Lorenda Ishihara, MD Follow up in 1  week(s).   Specialty: Internal Medicine Contact information: [address] [place] [address] [phone]                Allergies  Allergen Reactions   Ramipril Itching    The results of significant diagnostics from this hospitalization (including imaging, microbiology, ancillary and laboratory) are  listed below for reference.    Microbiology: Recent Results (from the past 240 hours)  C Difficile Quick Screen w PCR reflex     Status: Abnormal   Collection Time: 12/25/23  8:59 AM   Specimen: STOOL  Result Value Ref Range Status   C Diff antigen POSITIVE (A) NEGATIVE Final   C Diff toxin NEGATIVE NEGATIVE Final   C Diff interpretation Results are indeterminate. See PCR results.  Final    Comment: Performed at [facility], [address], [place], [place] [address]  C. Diff by PCR, Reflexed     Status: None   Collection Time: 12/25/23  8:59 AM  Result Value Ref Range Status   Toxigenic C. Difficile by PCR NEGATIVE NEGATIVE Final    Comment: Patient is colonized with non toxigenic C. difficile. May not need treatment unless significant symptoms are present. Performed at [facility], [address] [place], [place] [address]     Procedures/Studies: ECHOCARDIOGRAM LIMITED Result Date: 12/24/2023    ECHOCARDIOGRAM LIMITED REPORT   Patient Name:   LASHAWNNA LAMBRECHT Date of Exam: 12/24/2023 Medical Rec #:  [id number]        Height:       66.0 in Accession #:    [id number]       Weight:       115.0 lb Date of Birth:  10-30-1933       BSA:          1.581 m Patient Age:    89 years         BP:           158/69 mmHg Patient Gender: F                HR:           80 bpm. Exam Location:  Inpatient Procedure: Limited Echo, Color Doppler and Cardiac Doppler (Both Spectral and            Color Flow Doppler were utilized during procedure). Indications:    atrial fibrillation  History:        Patient has prior history of Echocardiogram examinations, most                  recent 11/25/2023. CHF, Cancer. Chronic kidney disease,                 Arrythmias:Atrial Fibrillation; Risk Factors:Hypertension.  Sonographer:    Delcie Roch RDCS Referring Phys: [id number] VISHAL R PATEL IMPRESSIONS  1. Left ventricular ejection fraction, by estimation, is 70 to 75%. The left ventricle has hyperdynamic function. There is moderate asymmetric left ventricular hypertrophy of the basal-septal segment.  2. The mitral valve is degenerative. Trivial mitral valve regurgitation. Moderate mitral annular calcification.  3. The aortic valve is tricuspid. There is mild calcification of the aortic valve. There is mild thickening of the aortic valve. Aortic valve regurgitation is moderate. Aortic regurgitation PHT measures 440 msec.  4. There is moderately elevated pulmonary artery systolic pressure.  5. The inferior vena cava is dilated in size with >50% respiratory variability, suggesting right atrial pressure of 8 mmHg. FINDINGS  Left Ventricle: Left ventricular ejection fraction, by estimation, is 70 to 75%. The left ventricle has hyperdynamic function. There is moderate asymmetric left ventricular hypertrophy of the basal-septal segment. Right Ventricle: There is moderately elevated pulmonary artery systolic pressure. The tricuspid regurgitant velocity is 3.43 m/s, and with an assumed right atrial pressure of 8 mmHg, the  estimated right ventricular systolic pressure is 55.1 mmHg. Mitral Valve: The mitral valve is degenerative in appearance. There is mild thickening of the mitral valve leaflet(s). There is mild calcification of the mitral valve leaflet(s). Moderate mitral annular calcification. Trivial mitral valve regurgitation. Tricuspid Valve: Tricuspid valve regurgitation is mild. Aortic Valve: The aortic valve is tricuspid. There is mild calcification of the aortic valve. There is mild thickening of the aortic valve. Aortic valve regurgitation is moderate. Aortic regurgitation PHT measures  440 msec. Pulmonic Valve: Pulmonic valve regurgitation is trivial. Venous: The inferior vena cava is dilated in size with greater than 50% respiratory variability, suggesting right atrial pressure of 8 mmHg. Additional Comments: Spectral Doppler performed. Color Doppler performed.  LEFT VENTRICLE PLAX 2D LVIDd:         3.20 cm LVIDs:         1.70 cm LV PW:         1.01 cm LV IVS:        1.57 cm  IVC IVC diam: 2.20 cm LEFT ATRIUM         Index LA diam:    4.40 cm 2.78 cm/m  AORTIC VALVE LVOT Vmax:   90.70 cm/s LVOT Vmean:  60.200 cm/s LVOT VTI:    0.194 m AI PHT:      440 msec TRICUSPID VALVE TR Peak grad:   47.1 mmHg TR Vmax:        343.00 cm/s  SHUNTS Systemic VTI: 0.19 m Chilton Si MD Electronically signed by Chilton Si MD Signature Date/Time: 12/24/2023/5:38:46 PM    Final    DG Chest 2 View Result Date: 12/23/2023 CLINICAL DATA:  Tachycardia. Recently diagnosed atrial fibrillation. Shortness of breath. EXAM: CHEST - 2 VIEW COMPARISON:  12/05/2023. Chest, abdomen and pelvis CT dated 10/16/2023 FINDINGS: Interval mildly enlarged cardiac silhouette. Increased prominence of the pulmonary vasculature and interstitial markings with interval moderate-sized bilateral pleural effusions and bibasilar atelectasis. Stable severe T11 vertebral compression fracture with vertebral plana and acute kyphosis. IMPRESSION: 1. Interval changes of CHF with mild cardiomegaly, interval moderate pulmonary edema and moderate-sized bilateral pleural effusions. 2. Bibasilar atelectasis. Electronically Signed   By: Beckie Salts M.D.   On: 12/23/2023 17:56   DG Chest Port 1 View Result Date: 12/05/2023 CLINICAL DATA:  Shortness of breath EXAM: PORTABLE CHEST 1 VIEW COMPARISON:  Chest radiograph dated 04/07/2013 FINDINGS: Low lung volumes with bronchovascular crowding. Bibasilar patchy opacities and dense left retrocardiac opacity. Blunting of the bilateral costophrenic angles. No pneumothorax. The heart size and mediastinal  contours are within normal limits. No acute osseous abnormality. IMPRESSION: 1. Low lung volumes with bronchovascular crowding. Bibasilar patchy opacities and dense left retrocardiac opacity, which may represent atelectasis, aspiration, or pneumonia. 2. Blunting of the bilateral costophrenic angles, which may represent small pleural effusions. Electronically Signed   By: Agustin Cree M.D.   On: 12/05/2023 16:33   DG ESOPHAGUS W SINGLE CM (SOL OR THIN BA) Result Date: 12/01/2023 CLINICAL DATA:  Dysphagia, pain during swallowing. EXAM: ESOPHOGRAM/BARIUM SWALLOW TECHNIQUE: Single contrast examination was performed using  thin barium. FLUOROSCOPY: Radiation Exposure Index (as provided by the fluoroscopic device): 7.0 mGy Kerma COMPARISON:  None Available. FINDINGS: Limited assessment of swallowing due to kyphosis. Poor esophageal motility with stasis of contrast in the esophagus. Gastroesophageal junction does expand somewhat during swallowing but a 13 mm barium tablet would not pass into the stomach. IMPRESSION: 1. Slight narrowing at the gastroesophageal junction, through which a 13 mm barium tablet would not pass. 2.  Esophageal dysmotility. Electronically Signed   By: Leanna Battles M.D.   On: 12/01/2023 09:00    Labs: BNP (last 3 results) Recent Labs    12/05/23 1536 12/23/23 1605  BNP 998.6* 2,212.1*   Basic Metabolic Panel: Recent Labs  Lab 12/24/23 0231 12/25/23 0312 12/26/23 0843 12/27/23 0310 12/28/23 0306 12/29/23 0332  NA 135 139 141 139 139 140  K 3.8 3.1* 3.4* 3.4* 3.3* 3.9  CL 107 106 107 106 107 110  CO2 22 26 25 27 26 26   GLUCOSE 304* 149* 93 119* 115* 122*  BUN 41* 39* 34* 32* 28* 26*  CREATININE 1.27* 1.27* 1.11* 1.13* 1.16* 1.10*  CALCIUM 7.7* 7.8* 7.7* 7.6* 7.4* 7.5*  MG 1.4* 1.8 1.5*  --   --   --    CBC: Recent Labs  Lab 12/24/23 0231 12/25/23 0312 12/26/23 0843 12/27/23 0310 12/28/23 0306 12/28/23 1201  WBC 11.4* 9.5 8.2 6.2 5.8  --   HGB 7.4* 8.7* 8.2*  7.6* 7.4* 8.5*  HCT 23.5* 27.2* 24.8* 23.1* 22.5* 26.1*  MCV 104.0* 103.0* 101.6* 101.3* 102.3*  --   PLT 164 158 122* 115* 121*  --   Urinalysis    Component Value Date/Time   COLORURINE YELLOW 11/21/2023 1840   APPEARANCEUR CLEAR 11/21/2023 1840   LABSPEC 1.016 11/21/2023 1840   PHURINE 5.0 11/21/2023 1840   GLUCOSEU NEGATIVE 11/21/2023 1840   HGBUR NEGATIVE 11/21/2023 1840   BILIRUBINUR NEGATIVE 11/21/2023 1840   BILIRUBINUR n 09/15/2015 1123   KETONESUR NEGATIVE 11/21/2023 1840   PROTEINUR NEGATIVE 11/21/2023 1840   UROBILINOGEN negative 09/15/2015 1123   UROBILINOGEN 0.2 04/11/2009 1015   NITRITE NEGATIVE 11/21/2023 1840   LEUKOCYTESUR NEGATIVE 11/21/2023 1840   Sepsis Labs Recent Labs  Lab 12/25/23 0312 12/26/23 0843 12/27/23 0310 12/28/23 0306  WBC 9.5 8.2 6.2 5.8   Microbiology Recent Results (from the past 240 hours)  C Difficile Quick Screen w PCR reflex     Status: Abnormal   Collection Time: 12/25/23  8:59 AM   Specimen: STOOL  Result Value Ref Range Status   C Diff antigen POSITIVE (A) NEGATIVE Final   C Diff toxin NEGATIVE NEGATIVE Final   C Diff interpretation Results are indeterminate. See PCR results.  Final    Comment: Performed at [facility], [address], [place], [place] [address]  C. Diff by PCR, Reflexed     Status: None   Collection Time: 12/25/23  8:59 AM  Result Value Ref Range Status   Toxigenic C. Difficile by PCR NEGATIVE NEGATIVE Final    Comment: Patient is colonized with non toxigenic C. difficile. May not need treatment unless significant symptoms are present. Performed at [facility], [address] [place], [place] [address]    Time coordinating discharge: 35 minutes  SIGNED: Lanae Boast, MD  [facility] 12/29/2023, 9:51 AM  If 7PM-7AM, please contact night-coverage [email or web address]

## 2023-12-29 NOTE — Progress Notes (Addendum)
 Called for the 4th time; getting transferred out and was told to call again. RN called again. No pickup. Will try again but will not hold patient in room as transportation has been holding for patient for some time now. Patient left unit at 1245 with [facility].   Updates:  @1340pm , called to [facility] to give report. Phone call was transferred but no one picked up at this time.

## 2023-12-29 NOTE — Consult Note (Signed)
 [facility] Liaison Consult Note   12/29/2023  [name] 19-Aug-1934 [id number]  [facility] Consult:  [facility]O REACH follow up for needs for SNF   Primary Care Provider:  Lorenda Ishihara, MD, with Deboraha Sprang at [facility]:  Medicare ACO REACH  Patient was reviewed for less than 30 day readmission with extreme high risk score with a 5 day length of stay to check for barriers to care when returning to community.  12:48 pm Rounding on unit and patient already transitioned to [facility] for rehab per unit RN.  Patient was screened for hospitalization and on behalf of [facility]  Care Coordination to assess for post hospital community care needs.  Patient is being considered for a skilled nursing facility level of care for post hospital transition.  Patient or [name] an affiliated facility with [facility].  Plan:  Will notify the [facility] RN who can be notified and follow for any known needs for transitional care needs for returning to post facility care coordination needs for returning to community.   For questions or referrals, please contact:  Charlesetta Shanks, RN, BSN, CCM [place]  [facility], [facility] Liaison Direct Dial: [phone] or secure chat Email: [email or web address]

## 2023-12-29 NOTE — Progress Notes (Signed)
 Call to give report attempted; no pickup. Will try again later.

## 2023-12-29 NOTE — TOC Initial Note (Signed)
 Transition of Care [facility]) - Initial/Assessment Note    Patient Details  Name: [name] MRN: [id number] Date of Birth: 1934/04/04  Transition of [facility]) CM/SW Contact:    Eduard Roux, LCSW Phone Number: 12/29/2023, 11:37 AM  Clinical Narrative:                  Patient will Discharge to: [facility] Discharge Date: 12/29/2023 Family Notified: daughter Transport [id number]  Per MD patient is ready for discharge. RN, patient, and facility notified of discharge. Discharge Summary sent to facility. RN given number for [phone], Room 511. Ambulance transport requested for patient.   Clinical Social Worker signing off.  Antony Blackbird, MSW, LCSW Clinical Social Worker     Expected Discharge Plan: Skilled Nursing Facility Barriers to Discharge: Barriers Resolved   Patient Goals and CMS Choice            Expected Discharge Plan and Services         Expected Discharge Date: 12/29/23                                    Prior Living Arrangements/Services     Patient language and need for interpreter reviewed:: No        Need for Family Participation in Patient Care: Yes (Comment) Care giver support system in place?: Yes (comment)   Criminal Activity/Legal Involvement Pertinent to Current Situation/Hospitalization: No - Comment as needed  Activities of Daily Living   ADL Screening (condition at time of admission) Independently performs ADLs?: No Does the patient have a NEW difficulty with bathing/dressing/toileting/self-feeding that is expected to last >3 days?: No (needs assist) Does the patient have a NEW difficulty with getting in/out of bed, walking, or climbing stairs that is expected to last >3 days?: No (needs assist up with walker) Does the patient have a NEW difficulty with communication that is expected to last >3 days?: No Is the patient deaf or have difficulty hearing?: No Does the patient have difficulty seeing, even  when wearing glasses/contacts?: No Does the patient have difficulty concentrating, remembering, or making decisions?: No  Permission Sought/Granted Permission sought to share information with : Family Supports Permission granted to share information with : Yes, Verbal Permission Granted  Share Information with NAME: Jerral Ralph  Permission granted to share info w AGENCY: [facility]  Permission granted to share info w Relationship: dsaughter  Permission granted to share info w Contact Information: [phone]  Emotional Assessment Appearance:: Appears stated age   Affect (typically observed): Accepting, Appropriate Orientation: : Oriented to Self, Oriented to Situation, Oriented to Place, Oriented to  Time Alcohol / Substance Use: Not Applicable Psych Involvement: No (comment)  Admission diagnosis:  Acute pulmonary edema (HCC) [J81.0] Atrial fibrillation with RVR (HCC) [I48.91] Paroxysmal atrial fibrillation with RVR (HCC) [I48.0] Patient Active Problem List   Diagnosis Date Noted   Electrolyte abnormality 12/27/2023   Persistent atrial fibrillation (HCC) 12/27/2023   Diastolic CHF, chronic (HCC) 12/25/2023   Paroxysmal atrial fibrillation with RVR (HCC) 12/23/2023   Acute on chronic heart failure with preserved ejection fraction (HFpEF, >= 50%) (HCC) 12/23/2023   Chronic kidney disease, stage 3b (HCC) 12/23/2023   Thrombocytopenia (HCC) 12/06/2023   Atrial fibrillation with RVR (HCC) 12/05/2023   Medication management 12/03/2023   Counseling and coordination of care 12/02/2023   Odynophagia 12/02/2023   Palliative care encounter 12/02/2023   Protein-calorie malnutrition, severe 12/02/2023  Failure to thrive 11/30/2023   Dysphagia 11/30/2023   Bicytopenia 11/27/2023   Hypokalemia 11/27/2023   New onset a-fib (HCC) 11/24/2023   Hypertrophic cardiomyopathy (HCC) 11/24/2023   Demand ischemia (HCC) 11/24/2023   Chronic anemia 11/24/2023   Malignant neoplasm of ovary (HCC)  11/24/2023   AKI (acute kidney injury) (HCC) 11/21/2023   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) 11/12/2023   Infection of hand 02/02/2022   Genetic testing 07/20/2019   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/20/2019   Family history of breast cancer    Carcinoma of upper-outer quadrant of left breast in female, estrogen receptor positive (HCC) 05/19/2019   Left carotid bruit 01/21/2017   Hypertension    Hyperlipidemia    GERD (gastroesophageal reflux disease)    Ureteral calculi    History of breast cancer    History of ovarian cancer    History of skin cancer    Frequency of urination    Dysuria    Kidney stones    CAD (coronary artery disease)    Ductal carcinoma (HCC) 08/11/2013   Serous tumor, 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 [place] [address] Phone: [phone] Fax: [phone]     Social Drivers of Health (SDOH) Social History: SDOH Screenings   Food Insecurity: No Food Insecurity (12/23/2023)  Housing: Low Risk  (12/23/2023)  Transportation Needs: No Transportation Needs (12/23/2023)  Utilities: Not At Risk (12/23/2023)  Depression (PHQ2-9): Low Risk  (11/11/2023)  Social Connections: Moderately Isolated (12/23/2023)  Tobacco Use: Low Risk  (12/23/2023)   SDOH Interventions:     Readmission Risk Interventions    12/09/2023   12:21 PM 11/23/2023   11:56 AM  Readmission Risk Prevention Plan  Transportation Screening Complete Complete  PCP or Specialist Appt within 5-7 Days  Complete  Home Care Screening  Complete  Medication Review (RN CM)  Complete  Skilled Nursing Facility Complete

## 2023-12-29 NOTE — Progress Notes (Signed)
 Mobility Specialist Progress Note:    12/29/23 1212  Mobility  Activity Stood at bedside (at chair)  Level of Assistance Moderate assist, patient does 50-74%  Assistive Device Front wheel walker  Activity Response Tolerated well  Mobility Referral Yes  Mobility visit 1 Mobility  Mobility Specialist Start Time (ACUTE ONLY) 1052  Mobility Specialist Stop Time (ACUTE ONLY) 1106  Mobility Specialist Time Calculation (min) (ACUTE ONLY) 14 min   Pt received in chair stating she had a BM in the chair. Pt needed ModA for STS. Was able to perform x2 STS so MS can perform pericare. Situated back in chair, chair alarm on. Call bell and personal belongings in reach. All needs met.   Thompson Grayer Mobility Specialist  Please contact vis Secure Chat or  Rehab Office [phone]

## 2023-12-30 ENCOUNTER — Telehealth: Payer: Self-pay | Admitting: Nurse Practitioner

## 2023-12-30 DIAGNOSIS — I10 Essential (primary) hypertension: Secondary | ICD-10-CM | POA: Diagnosis not present

## 2023-12-30 DIAGNOSIS — I480 Paroxysmal atrial fibrillation: Secondary | ICD-10-CM | POA: Diagnosis not present

## 2023-12-30 DIAGNOSIS — M069 Rheumatoid arthritis, unspecified: Secondary | ICD-10-CM | POA: Diagnosis not present

## 2023-12-30 DIAGNOSIS — D61818 Other pancytopenia: Secondary | ICD-10-CM | POA: Diagnosis not present

## 2023-12-30 DIAGNOSIS — I4891 Unspecified atrial fibrillation: Secondary | ICD-10-CM | POA: Diagnosis not present

## 2023-12-30 NOTE — Telephone Encounter (Signed)
 Spoke with patient confirming upcoming appointment

## 2023-12-31 DIAGNOSIS — I4891 Unspecified atrial fibrillation: Secondary | ICD-10-CM | POA: Diagnosis not present

## 2023-12-31 DIAGNOSIS — I5031 Acute diastolic (congestive) heart failure: Secondary | ICD-10-CM | POA: Diagnosis not present

## 2023-12-31 DIAGNOSIS — D649 Anemia, unspecified: Secondary | ICD-10-CM | POA: Diagnosis not present

## 2024-01-01 ENCOUNTER — Ambulatory Visit
Admit: 2024-01-01 | Discharge: 2024-01-01 | Disposition: A | Discharge status: Home / Self Care | Payer: Medicare Other | Attending: Radiation Oncology | Admitting: Radiation Oncology

## 2024-01-01 ENCOUNTER — Other Ambulatory Visit: Payer: Self-pay

## 2024-01-01 ENCOUNTER — Encounter (HOSPITAL_COMMUNITY): Payer: Self-pay | Admitting: Emergency Medicine

## 2024-01-01 ENCOUNTER — Emergency Department (HOSPITAL_COMMUNITY)

## 2024-01-01 ENCOUNTER — Inpatient Hospital Stay (HOSPITAL_COMMUNITY)
Admission: EM | Admit: 2024-01-01 | Discharge: 2024-01-03 | DRG: 308 | Disposition: A | Discharge status: Skilled Nursing Facility | Source: Skilled Nursing Facility | Attending: Internal Medicine | Admitting: Internal Medicine

## 2024-01-01 DIAGNOSIS — K529 Noninfective gastroenteritis and colitis, unspecified: Secondary | ICD-10-CM | POA: Diagnosis present

## 2024-01-01 DIAGNOSIS — C786 Secondary malignant neoplasm of retroperitoneum and peritoneum: Secondary | ICD-10-CM | POA: Diagnosis present

## 2024-01-01 DIAGNOSIS — R002 Palpitations: Secondary | ICD-10-CM | POA: Diagnosis not present

## 2024-01-01 DIAGNOSIS — M19042 Primary osteoarthritis, left hand: Secondary | ICD-10-CM | POA: Diagnosis present

## 2024-01-01 DIAGNOSIS — Z83438 Family history of other disorder of lipoprotein metabolism and other lipidemia: Secondary | ICD-10-CM

## 2024-01-01 DIAGNOSIS — C787 Secondary malignant neoplasm of liver and intrahepatic bile duct: Secondary | ICD-10-CM | POA: Diagnosis present

## 2024-01-01 DIAGNOSIS — Z9889 Other specified postprocedural states: Secondary | ICD-10-CM

## 2024-01-01 DIAGNOSIS — E8809 Other disorders of plasma-protein metabolism, not elsewhere classified: Secondary | ICD-10-CM | POA: Diagnosis present

## 2024-01-01 DIAGNOSIS — Z8589 Personal history of malignant neoplasm of other organs and systems: Secondary | ICD-10-CM

## 2024-01-01 DIAGNOSIS — I2510 Atherosclerotic heart disease of native coronary artery without angina pectoris: Secondary | ICD-10-CM | POA: Diagnosis present

## 2024-01-01 DIAGNOSIS — Z853 Personal history of malignant neoplasm of breast: Secondary | ICD-10-CM

## 2024-01-01 DIAGNOSIS — I4892 Unspecified atrial flutter: Secondary | ICD-10-CM | POA: Diagnosis present

## 2024-01-01 DIAGNOSIS — C7981 Secondary malignant neoplasm of breast: Secondary | ICD-10-CM | POA: Diagnosis present

## 2024-01-01 DIAGNOSIS — D539 Nutritional anemia, unspecified: Secondary | ICD-10-CM | POA: Diagnosis present

## 2024-01-01 DIAGNOSIS — Z87442 Personal history of urinary calculi: Secondary | ICD-10-CM

## 2024-01-01 DIAGNOSIS — C7800 Secondary malignant neoplasm of unspecified lung: Secondary | ICD-10-CM | POA: Diagnosis present

## 2024-01-01 DIAGNOSIS — E876 Hypokalemia: Secondary | ICD-10-CM | POA: Diagnosis present

## 2024-01-01 DIAGNOSIS — Z803 Family history of malignant neoplasm of breast: Secondary | ICD-10-CM

## 2024-01-01 DIAGNOSIS — D649 Anemia, unspecified: Secondary | ICD-10-CM | POA: Diagnosis not present

## 2024-01-01 DIAGNOSIS — Z8249 Family history of ischemic heart disease and other diseases of the circulatory system: Secondary | ICD-10-CM

## 2024-01-01 DIAGNOSIS — I2489 Other forms of acute ischemic heart disease: Secondary | ICD-10-CM | POA: Diagnosis present

## 2024-01-01 DIAGNOSIS — K219 Gastro-esophageal reflux disease without esophagitis: Secondary | ICD-10-CM | POA: Diagnosis present

## 2024-01-01 DIAGNOSIS — E785 Hyperlipidemia, unspecified: Secondary | ICD-10-CM | POA: Diagnosis present

## 2024-01-01 DIAGNOSIS — N1831 Chronic kidney disease, stage 3a: Secondary | ICD-10-CM | POA: Diagnosis present

## 2024-01-01 DIAGNOSIS — I4891 Unspecified atrial fibrillation: Secondary | ICD-10-CM | POA: Diagnosis not present

## 2024-01-01 DIAGNOSIS — Z7982 Long term (current) use of aspirin: Secondary | ICD-10-CM

## 2024-01-01 DIAGNOSIS — I422 Other hypertrophic cardiomyopathy: Secondary | ICD-10-CM | POA: Diagnosis present

## 2024-01-01 DIAGNOSIS — R7989 Other specified abnormal findings of blood chemistry: Secondary | ICD-10-CM | POA: Diagnosis not present

## 2024-01-01 DIAGNOSIS — Z9841 Cataract extraction status, right eye: Secondary | ICD-10-CM

## 2024-01-01 DIAGNOSIS — Z66 Do not resuscitate: Secondary | ICD-10-CM | POA: Diagnosis present

## 2024-01-01 DIAGNOSIS — I130 Hypertensive heart and chronic kidney disease with heart failure and stage 1 through stage 4 chronic kidney disease, or unspecified chronic kidney disease: Secondary | ICD-10-CM | POA: Diagnosis present

## 2024-01-01 DIAGNOSIS — Z86000 Personal history of in-situ neoplasm of breast: Secondary | ICD-10-CM

## 2024-01-01 DIAGNOSIS — C7951 Secondary malignant neoplasm of bone: Secondary | ICD-10-CM | POA: Diagnosis present

## 2024-01-01 DIAGNOSIS — Z9842 Cataract extraction status, left eye: Secondary | ICD-10-CM

## 2024-01-01 DIAGNOSIS — Z90722 Acquired absence of ovaries, bilateral: Secondary | ICD-10-CM

## 2024-01-01 DIAGNOSIS — Z9049 Acquired absence of other specified parts of digestive tract: Secondary | ICD-10-CM

## 2024-01-01 DIAGNOSIS — D696 Thrombocytopenia, unspecified: Secondary | ICD-10-CM | POA: Diagnosis present

## 2024-01-01 DIAGNOSIS — M47819 Spondylosis without myelopathy or radiculopathy, site unspecified: Secondary | ICD-10-CM | POA: Diagnosis present

## 2024-01-01 DIAGNOSIS — Z79818 Long term (current) use of other agents affecting estrogen receptors and estrogen levels: Secondary | ICD-10-CM

## 2024-01-01 DIAGNOSIS — I4819 Other persistent atrial fibrillation: Principal | ICD-10-CM | POA: Diagnosis present

## 2024-01-01 DIAGNOSIS — R0789 Other chest pain: Secondary | ICD-10-CM | POA: Diagnosis not present

## 2024-01-01 DIAGNOSIS — I10 Essential (primary) hypertension: Secondary | ICD-10-CM | POA: Diagnosis present

## 2024-01-01 DIAGNOSIS — I509 Heart failure, unspecified: Secondary | ICD-10-CM | POA: Diagnosis not present

## 2024-01-01 DIAGNOSIS — T502X5A Adverse effect of carbonic-anhydrase inhibitors, benzothiadiazides and other diuretics, initial encounter: Secondary | ICD-10-CM | POA: Diagnosis present

## 2024-01-01 DIAGNOSIS — R531 Weakness: Secondary | ICD-10-CM | POA: Diagnosis not present

## 2024-01-01 DIAGNOSIS — J90 Pleural effusion, not elsewhere classified: Secondary | ICD-10-CM | POA: Diagnosis not present

## 2024-01-01 DIAGNOSIS — Z888 Allergy status to other drugs, medicaments and biological substances status: Secondary | ICD-10-CM

## 2024-01-01 DIAGNOSIS — R0989 Other specified symptoms and signs involving the circulatory and respiratory systems: Secondary | ICD-10-CM | POA: Diagnosis not present

## 2024-01-01 DIAGNOSIS — Z923 Personal history of irradiation: Secondary | ICD-10-CM

## 2024-01-01 DIAGNOSIS — I351 Nonrheumatic aortic (valve) insufficiency: Secondary | ICD-10-CM | POA: Diagnosis present

## 2024-01-01 DIAGNOSIS — Z961 Presence of intraocular lens: Secondary | ICD-10-CM | POA: Diagnosis present

## 2024-01-01 DIAGNOSIS — R54 Age-related physical debility: Secondary | ICD-10-CM | POA: Diagnosis present

## 2024-01-01 DIAGNOSIS — J810 Acute pulmonary edema: Secondary | ICD-10-CM | POA: Diagnosis not present

## 2024-01-01 DIAGNOSIS — Z7401 Bed confinement status: Secondary | ICD-10-CM | POA: Diagnosis not present

## 2024-01-01 DIAGNOSIS — Z8619 Personal history of other infectious and parasitic diseases: Secondary | ICD-10-CM

## 2024-01-01 DIAGNOSIS — R000 Tachycardia, unspecified: Secondary | ICD-10-CM | POA: Diagnosis not present

## 2024-01-01 DIAGNOSIS — I480 Paroxysmal atrial fibrillation: Secondary | ICD-10-CM

## 2024-01-01 DIAGNOSIS — Z90710 Acquired absence of both cervix and uterus: Secondary | ICD-10-CM

## 2024-01-01 DIAGNOSIS — I5033 Acute on chronic diastolic (congestive) heart failure: Secondary | ICD-10-CM | POA: Diagnosis not present

## 2024-01-01 DIAGNOSIS — Z8543 Personal history of malignant neoplasm of ovary: Secondary | ICD-10-CM

## 2024-01-01 DIAGNOSIS — Z85828 Personal history of other malignant neoplasm of skin: Secondary | ICD-10-CM

## 2024-01-01 DIAGNOSIS — M19041 Primary osteoarthritis, right hand: Secondary | ICD-10-CM | POA: Diagnosis present

## 2024-01-01 DIAGNOSIS — Z79899 Other long term (current) drug therapy: Secondary | ICD-10-CM

## 2024-01-01 NOTE — ED Provider Notes (Signed)
 [place] EMERGENCY DEPARTMENT AT [facility] Provider Note   CSN: [id number] Arrival date & time: 01/01/24  2255     History {Add pertinent medical, surgical, social history, OB history to HPI:1} Chief Complaint  Patient presents with   Tachycardia    [name] is a 89 y.o. female.  89 yo F w/ recent diagnosis of PAF on diltiazem presents to the ED for chest pressure.  Patient recently admitted for few days with A-fib/flutter.  Saw that she was doing well until tonight she had chest pressure so she went to the staff at her facility who stated that her heart rate was as high as 160 so he called EMS and brought her here.  Patient states that she did have shortness of breath with that but no lightheadedness diaphoresis or nausea.  She states it was similar to when she was in the hospital last time.  At this time patient is asymptomatic.  No other recent illnesses.  Eat and drinking okay.        Home Medications Prior to Admission medications   Medication Sig Start Date End Date Taking? Authorizing Provider  Amino Acids-Protein Hydrolys (PRO-STAT) LIQD Take 30 mLs by mouth in the morning and at bedtime.    [provider]  aspirin EC 81 MG tablet Take 81 mg by mouth daily.    [provider]  BELSOMRA 10 MG TABS Take 10 mg by mouth at bedtime. 01/20/19   [provider]  Calcium Carbonate-Vitamin D (CALTRATE 600+D PO) Take 1 tablet by mouth 2 (two) times daily.    [provider]  Cholecalciferol (VITAMIN D3) 50 MCG (2000 UT) TABS Take 2,000 Units by mouth in the morning and at bedtime.    [provider]  cyanocobalamin (VITAMIN B12) 1000 MCG tablet Take 1,000 mcg by mouth daily.    [provider]  diltiazem (CARDIZEM CD) 240 MG 24 hr capsule Take 1 capsule (240 mg total) by mouth daily. 12/09/23   Almon Hercules, MD  furosemide (LASIX) 40 MG tablet Take 1 tablet (40 mg total) by mouth daily. 12/29/23   Lanae Boast, MD   hydroxychloroquine (PLAQUENIL) 200 MG tablet Take 200 mg by mouth daily. 11/08/21   [provider]  isosorbide mononitrate (IMDUR) 60 MG 24 hr tablet Take 1 tablet (60 mg total) by mouth daily. 05/16/23   Jake Bathe, MD  loperamide (IMODIUM) 2 MG capsule Take 1 capsule (2 mg total) by mouth as needed for diarrhea or loose stools. 12/04/23   Briant Cedar, MD  melatonin 3 MG TABS tablet Take 1 tablet (3 mg total) by mouth at bedtime as needed. 12/29/23   Lanae Boast, MD  metoprolol tartrate (LOPRESSOR) 25 MG tablet Take 1 tablet (25 mg total) by mouth 2 (two) times daily. Patient taking differently: Take 25-50 mg by mouth See admin instructions. Take 2 tablets by mouth in the moring ansd 2 tablets in the evening for Afib 12/09/23   Rai, Ripudeep K, MD  Multiple Vitamins-Minerals (MULTIVITAMIN WOMEN 50+) TABS Take 1 tablet by mouth daily with breakfast. Decubi-Vite    [provider]  pantoprazole (PROTONIX) 20 MG tablet Take 1 tablet (20 mg total) by mouth daily. 12/05/23   Briant Cedar, MD  potassium chloride (KLOR-CON) 20 MEQ packet Take 20 mEq by mouth daily. 12/29/23   Lanae Boast, MD  PROCTOZONE-HC 2.5 % rectal cream PLACE 1 APPLICATION RECTALLY 2 TIMES A DAY AS NEEDED FOR HEMORRHOIDS.  Patient taking differently: Place 1 application  rectally 2 (two) times daily as needed for hemorrhoids. 01/09/15   Cross, Efraim Kaufmann D, NP  rosuvastatin (CRESTOR) 10 MG tablet Take 10 mg by mouth at bedtime.    [provider]  sodium bicarbonate 650 MG tablet Take 1 tablet (650 mg total) by mouth 2 (two) times daily. 12/08/23   Almon Hercules, MD  sucralfate (CARAFATE) 1 GM/10ML suspension Take 10 mLs (1 g total) by mouth 4 (four) times daily -  with meals and at bedtime. Patient taking differently: Take 1 g by mouth with breakfast, with lunch, and with evening meal. 12/04/23   Briant Cedar, MD  tiZANidine (ZANAFLEX) 4 MG tablet Take 4 mg by mouth 2 (two) times daily as needed  for muscle spasms. 10/10/23   [provider]  triamcinolone (KENALOG) 0.1 % Apply 1 application  topically as needed (for rashes- affected areas). 04/04/20   [provider]  Wound Dressings (SONAFINE) Apply 1 Application topically 2 (two) times daily as needed (Apply to areas of redness from radiation as needed. Call Dr. Colletta Maryland nurse at [phone] if you have any questions). 12/04/23   Briant Cedar, MD      Allergies    Ramipril    Review of Systems   Review of Systems  Physical Exam Updated Vital Signs BP (!) 150/66   Pulse (!) 112   Temp (!) 97.5 F (36.4 C)   Resp 18   Ht 5\' 6"  (1.676 m)   Wt 54.6 kg   LMP 10/07/1974   SpO2 99%   BMI 19.43 kg/m  Physical Exam Vitals and nursing note reviewed.  Constitutional:      Appearance: She is well-developed.  HENT:     Head: Normocephalic and atraumatic.  Eyes:     Pupils: Pupils are equal, round, and reactive to light.  Cardiovascular:     Rate and Rhythm: Tachycardia present. Rhythm irregular.  Pulmonary:     Effort: No respiratory distress.     Breath sounds: No stridor.  Abdominal:     General: There is no distension.  Musculoskeletal:     Cervical back: Normal range of motion.     Right lower leg: Edema present.     Left lower leg: Edema present.  Skin:    General: Skin is warm and dry.  Neurological:     General: No focal deficit present.     Mental Status: She is alert.  Psychiatric:        Mood and Affect: Mood normal.     ED Results / Procedures / Treatments   Labs (all labs ordered are listed, but only abnormal results are displayed) Labs Reviewed  CBC WITH DIFFERENTIAL/PLATELET  COMPREHENSIVE METABOLIC PANEL WITH GFR  BRAIN NATRIURETIC PEPTIDE  LIPASE, BLOOD  TROPONIN I (HIGH SENSITIVITY)    EKG None  Radiology No results found.  Procedures Procedures    Medications Ordered in ED Medications - No data to display  ED Course/ Medical Decision Making/ A&P                                  Medical Decision Making Amount and/or Complexity of Data Reviewed Labs: ordered. Radiology: ordered. ECG/medicine tests: ordered.  Afib no RVR on ecg or initial vitals. Will monitor.  Cp  without truly fast RVR - troponin, bnp, cxr. LE edema bilaterally pitting at least halfway up shin, possible  fluid overload related to HR.  ***  {Document critical care time when appropriate:1} {Document review of labs and clinical decision tools ie heart score, Chads2Vasc2 etc:1}  {Document your independent review of radiology images, and any outside records:1} {Document your discussion with family members, caretakers, and with consultants:1} {Document social determinants of health affecting pt's care:1} {Document your decision making why or why not admission, treatments were needed:1} Final Clinical Impression(s) / ED Diagnoses Final diagnoses:  None    Rx / DC Orders ED Discharge Orders     None

## 2024-01-01 NOTE — Progress Notes (Signed)
 Patient identity verified x2.  [name] presents today for a telephone follow up. 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  Carcinoma of upper-outer quadrant of left breast estrogen receptor positive.  Patient completed radiation treatment on 12/03/2023  Patient denies any back pain but is painful while moving.Pain is a 4 out of 10 while moving. Does the patient complain of any of the following: Post radiation skin issues: Patient denies. Joint Pain/ Swelling: Patient does have swelling on bilateral lower extremities since February. Range of Motion limitations: Patient has been hospitalized since February. Feeling weak and ambulating with a walker. Fatigue post radiation: Patient says he has been having fatigue and weakness post radiation. She says feels some nights her sleep is good. Appetite good/fair/poor: Appetite is good  Breakfast Toast peanut butter and milk Lunch- Hamburger Dinner- Meatloaf    Breast Tenderness: Patient denies Breast Swelling:  Lymphadema: Patient denies Range of Motion limitations: Yes Fatigue post radiation: Yes  Wt Readings from Last 3 Encounters:  12/29/23 120 lb 5.9 oz (54.6 kg)  12/23/23 115 lb (52.2 kg)  12/05/23 114 lb 14.4 oz (52.1 kg)

## 2024-01-01 NOTE — ED Triage Notes (Signed)
 Pt BIB EMS from [facility] with c/o tachycardia, dx with afib. HR ranging 60-180s. SNF gave her a 25mg  metoprolol at 2018, after medication palpitations stopped.   153/71  95 on RA NS  20g LFA

## 2024-01-02 ENCOUNTER — Observation Stay (HOSPITAL_COMMUNITY)

## 2024-01-02 ENCOUNTER — Other Ambulatory Visit: Payer: Self-pay

## 2024-01-02 ENCOUNTER — Other Ambulatory Visit (HOSPITAL_COMMUNITY)

## 2024-01-02 DIAGNOSIS — I4891 Unspecified atrial fibrillation: Secondary | ICD-10-CM

## 2024-01-02 DIAGNOSIS — R7989 Other specified abnormal findings of blood chemistry: Secondary | ICD-10-CM | POA: Diagnosis not present

## 2024-01-02 DIAGNOSIS — I4892 Unspecified atrial flutter: Secondary | ICD-10-CM | POA: Diagnosis present

## 2024-01-02 DIAGNOSIS — D696 Thrombocytopenia, unspecified: Secondary | ICD-10-CM | POA: Diagnosis present

## 2024-01-02 DIAGNOSIS — C787 Secondary malignant neoplasm of liver and intrahepatic bile duct: Secondary | ICD-10-CM | POA: Diagnosis present

## 2024-01-02 DIAGNOSIS — I2489 Other forms of acute ischemic heart disease: Secondary | ICD-10-CM | POA: Diagnosis present

## 2024-01-02 DIAGNOSIS — D539 Nutritional anemia, unspecified: Secondary | ICD-10-CM | POA: Diagnosis present

## 2024-01-02 DIAGNOSIS — Z7401 Bed confinement status: Secondary | ICD-10-CM | POA: Diagnosis not present

## 2024-01-02 DIAGNOSIS — I130 Hypertensive heart and chronic kidney disease with heart failure and stage 1 through stage 4 chronic kidney disease, or unspecified chronic kidney disease: Secondary | ICD-10-CM | POA: Diagnosis present

## 2024-01-02 DIAGNOSIS — K529 Noninfective gastroenteritis and colitis, unspecified: Secondary | ICD-10-CM | POA: Diagnosis present

## 2024-01-02 DIAGNOSIS — Z66 Do not resuscitate: Secondary | ICD-10-CM | POA: Diagnosis present

## 2024-01-02 DIAGNOSIS — I4819 Other persistent atrial fibrillation: Secondary | ICD-10-CM | POA: Diagnosis present

## 2024-01-02 DIAGNOSIS — T502X5A Adverse effect of carbonic-anhydrase inhibitors, benzothiadiazides and other diuretics, initial encounter: Secondary | ICD-10-CM | POA: Diagnosis present

## 2024-01-02 DIAGNOSIS — I422 Other hypertrophic cardiomyopathy: Secondary | ICD-10-CM | POA: Diagnosis present

## 2024-01-02 DIAGNOSIS — C7800 Secondary malignant neoplasm of unspecified lung: Secondary | ICD-10-CM | POA: Diagnosis present

## 2024-01-02 DIAGNOSIS — E785 Hyperlipidemia, unspecified: Secondary | ICD-10-CM | POA: Diagnosis present

## 2024-01-02 DIAGNOSIS — K219 Gastro-esophageal reflux disease without esophagitis: Secondary | ICD-10-CM | POA: Diagnosis present

## 2024-01-02 DIAGNOSIS — I5033 Acute on chronic diastolic (congestive) heart failure: Secondary | ICD-10-CM

## 2024-01-02 DIAGNOSIS — E8809 Other disorders of plasma-protein metabolism, not elsewhere classified: Secondary | ICD-10-CM | POA: Diagnosis present

## 2024-01-02 DIAGNOSIS — C786 Secondary malignant neoplasm of retroperitoneum and peritoneum: Secondary | ICD-10-CM | POA: Diagnosis present

## 2024-01-02 DIAGNOSIS — E876 Hypokalemia: Secondary | ICD-10-CM | POA: Diagnosis present

## 2024-01-02 DIAGNOSIS — R531 Weakness: Secondary | ICD-10-CM | POA: Diagnosis not present

## 2024-01-02 DIAGNOSIS — I2510 Atherosclerotic heart disease of native coronary artery without angina pectoris: Secondary | ICD-10-CM | POA: Diagnosis present

## 2024-01-02 DIAGNOSIS — M19042 Primary osteoarthritis, left hand: Secondary | ICD-10-CM | POA: Diagnosis present

## 2024-01-02 DIAGNOSIS — C7951 Secondary malignant neoplasm of bone: Secondary | ICD-10-CM | POA: Diagnosis present

## 2024-01-02 DIAGNOSIS — I351 Nonrheumatic aortic (valve) insufficiency: Secondary | ICD-10-CM | POA: Diagnosis present

## 2024-01-02 DIAGNOSIS — C7981 Secondary malignant neoplasm of breast: Secondary | ICD-10-CM | POA: Diagnosis present

## 2024-01-02 DIAGNOSIS — N1831 Chronic kidney disease, stage 3a: Secondary | ICD-10-CM | POA: Diagnosis present

## 2024-01-02 LAB — TROPONIN I (HIGH SENSITIVITY)
Troponin I (High Sensitivity): 65 ng/L — ABNORMAL HIGH (ref <18)
Troponin I (High Sensitivity): 86 ng/L — ABNORMAL HIGH (ref <18)
Troponin I (High Sensitivity): 88 ng/L — ABNORMAL HIGH (ref <18)
Troponin I (High Sensitivity): 90 ng/L — ABNORMAL HIGH (ref <18)

## 2024-01-02 LAB — CBC WITH DIFFERENTIAL/PLATELET
Abs Immature Granulocytes: 0.1 10*3/uL — ABNORMAL HIGH (ref 0.00–0.07)
Basophils Absolute: 0 10*3/uL (ref 0.0–0.1)
Basophils Relative: 0 %
Eosinophils Absolute: 0 10*3/uL (ref 0.0–0.5)
Eosinophils Relative: 1 %
HCT: 21.1 % — ABNORMAL LOW (ref 36.0–46.0)
Hemoglobin: 6.6 g/dL — CL (ref 12.0–15.0)
Immature Granulocytes: 2 %
Lymphocytes Relative: 8 %
Lymphs Abs: 0.5 10*3/uL — ABNORMAL LOW (ref 0.7–4.0)
MCH: 33.5 pg (ref 26.0–34.0)
MCHC: 31.3 g/dL (ref 30.0–36.0)
MCV: 107.1 fL — ABNORMAL HIGH (ref 80.0–100.0)
Monocytes Absolute: 0.4 10*3/uL (ref 0.1–1.0)
Monocytes Relative: 5 %
Neutro Abs: 5.8 10*3/uL (ref 1.7–7.7)
Neutrophils Relative %: 84 %
Platelets: 142 10*3/uL — ABNORMAL LOW (ref 150–400)
RBC: 1.97 MIL/uL — ABNORMAL LOW (ref 3.87–5.11)
RDW: 16.1 % — ABNORMAL HIGH (ref 11.5–15.5)
WBC: 6.9 10*3/uL (ref 4.0–10.5)
nRBC: 0 % (ref 0.0–0.2)

## 2024-01-02 LAB — CBC
HCT: 27.4 % — ABNORMAL LOW (ref 36.0–46.0)
Hemoglobin: 8.5 g/dL — ABNORMAL LOW (ref 12.0–15.0)
MCH: 32.8 pg (ref 26.0–34.0)
MCHC: 31 g/dL (ref 30.0–36.0)
MCV: 105.8 fL — ABNORMAL HIGH (ref 80.0–100.0)
Platelets: 210 10*3/uL (ref 150–400)
RBC: 2.59 MIL/uL — ABNORMAL LOW (ref 3.87–5.11)
RDW: 16.1 % — ABNORMAL HIGH (ref 11.5–15.5)
WBC: 9.6 10*3/uL (ref 4.0–10.5)
nRBC: 0 % (ref 0.0–0.2)

## 2024-01-02 LAB — BASIC METABOLIC PANEL WITH GFR
Anion gap: 10 (ref 5–15)
BUN: 25 mg/dL — ABNORMAL HIGH (ref 8–23)
CO2: 22 mmol/L (ref 22–32)
Calcium: 8.2 mg/dL — ABNORMAL LOW (ref 8.9–10.3)
Chloride: 111 mmol/L (ref 98–111)
Creatinine, Ser: 1.06 mg/dL — ABNORMAL HIGH (ref 0.44–1.00)
GFR, Estimated: 51 mL/min — ABNORMAL LOW (ref >60)
Glucose, Bld: 110 mg/dL — ABNORMAL HIGH (ref 70–99)
Potassium: 5 mmol/L (ref 3.5–5.1)
Sodium: 143 mmol/L (ref 135–145)

## 2024-01-02 LAB — COMPREHENSIVE METABOLIC PANEL WITH GFR
ALT: 32 U/L (ref 0–44)
AST: 26 U/L (ref 15–41)
Albumin: 2 g/dL — ABNORMAL LOW (ref 3.5–5.0)
Alkaline Phosphatase: 96 U/L (ref 38–126)
Anion gap: 7 (ref 5–15)
BUN: 27 mg/dL — ABNORMAL HIGH (ref 8–23)
CO2: 21 mmol/L — ABNORMAL LOW (ref 22–32)
Calcium: 7.5 mg/dL — ABNORMAL LOW (ref 8.9–10.3)
Chloride: 114 mmol/L — ABNORMAL HIGH (ref 98–111)
Creatinine, Ser: 1.06 mg/dL — ABNORMAL HIGH (ref 0.44–1.00)
GFR, Estimated: 51 mL/min — ABNORMAL LOW (ref >60)
Glucose, Bld: 163 mg/dL — ABNORMAL HIGH (ref 70–99)
Potassium: 2.9 mmol/L — ABNORMAL LOW (ref 3.5–5.1)
Sodium: 142 mmol/L (ref 135–145)
Total Bilirubin: 0.6 mg/dL (ref 0.0–1.2)
Total Protein: 4.5 g/dL — ABNORMAL LOW (ref 6.5–8.1)

## 2024-01-02 LAB — PREPARE RBC (CROSSMATCH)

## 2024-01-02 LAB — LIPASE, BLOOD: Lipase: 28 U/L (ref 11–51)

## 2024-01-02 LAB — MAGNESIUM
Magnesium: 1.9 mg/dL (ref 1.7–2.4)
Magnesium: 1.9 mg/dL (ref 1.7–2.4)

## 2024-01-02 LAB — D-DIMER, QUANTITATIVE: D-Dimer, Quant: 3.21 ug{FEU}/mL — ABNORMAL HIGH (ref 0.00–0.50)

## 2024-01-02 LAB — ABO/RH: ABO/RH(D): AB POS

## 2024-01-02 LAB — BRAIN NATRIURETIC PEPTIDE: B Natriuretic Peptide: 2416.8 pg/mL — ABNORMAL HIGH (ref 0.0–100.0)

## 2024-01-02 MED ORDER — POTASSIUM CHLORIDE CRYS ER 20 MEQ PO TBCR
40.0000 meq | EXTENDED_RELEASE_TABLET | Freq: Once | ORAL | Status: DC
  Filled 2024-01-02: qty 2

## 2024-01-02 MED ORDER — FUROSEMIDE 10 MG/ML IJ SOLN
40.0000 mg | Freq: Two times a day (BID) | INTRAMUSCULAR | Status: DC
  Administered 2024-01-02 – 2024-01-03 (×3): 40 mg via INTRAVENOUS
  Filled 2024-01-02 (×3): qty 4

## 2024-01-02 MED ORDER — DILTIAZEM HCL-DEXTROSE 125-5 MG/125ML-% IV SOLN (PREMIX)
5.0000 mg/h | INTRAVENOUS | Status: DC
  Administered 2024-01-02: 5 mg/h via INTRAVENOUS
  Filled 2024-01-02: qty 125

## 2024-01-02 MED ORDER — POTASSIUM CHLORIDE 10 MEQ/100ML IV SOLN
10.0000 meq | INTRAVENOUS | Status: AC
Start: 2024-01-02 — End: 2024-01-02
  Administered 2024-01-02 (×3): 10 meq via INTRAVENOUS
  Filled 2024-01-02 (×3): qty 100

## 2024-01-02 MED ORDER — NITROGLYCERIN 0.4 MG SL SUBL: 0.4000 mg | SUBLINGUAL_TABLET | SUBLINGUAL | Status: DC | PRN

## 2024-01-02 MED ORDER — POTASSIUM CHLORIDE 20 MEQ PO PACK
80.0000 meq | PACK | Freq: Every day | ORAL | Status: DC
  Administered 2024-01-02: 80 meq via ORAL
  Filled 2024-01-02: qty 4

## 2024-01-02 MED ORDER — ACETAMINOPHEN 325 MG PO TABS: 650.0000 mg | ORAL_TABLET | Freq: Four times a day (QID) | ORAL | Status: DC | PRN

## 2024-01-02 MED ORDER — SODIUM CHLORIDE 0.9% IV SOLUTION: Freq: Once | INTRAVENOUS | Status: AC

## 2024-01-02 MED ORDER — ZOLPIDEM TARTRATE 5 MG PO TABS: 5.0000 mg | ORAL_TABLET | Freq: Every day | ORAL | Status: DC

## 2024-01-02 MED ORDER — DILTIAZEM HCL ER COATED BEADS 180 MG PO CP24
300.0000 mg | ORAL_CAPSULE | Freq: Every day | ORAL | Status: DC
  Administered 2024-01-02 – 2024-01-03 (×2): 300 mg via ORAL
  Filled 2024-01-02 (×2): qty 1

## 2024-01-02 MED ORDER — FUROSEMIDE 10 MG/ML IJ SOLN
40.0000 mg | Freq: Once | INTRAMUSCULAR | Status: AC
  Administered 2024-01-02: 40 mg via INTRAVENOUS
  Filled 2024-01-02: qty 4

## 2024-01-02 MED ORDER — METOPROLOL TARTRATE 25 MG PO TABS
25.0000 mg | ORAL_TABLET | Freq: Two times a day (BID) | ORAL | Status: DC
  Administered 2024-01-02 – 2024-01-03 (×3): 25 mg via ORAL
  Filled 2024-01-02 (×3): qty 1

## 2024-01-02 MED ORDER — ACETAMINOPHEN 650 MG RE SUPP: 650.0000 mg | Freq: Four times a day (QID) | RECTAL | Status: DC | PRN

## 2024-01-02 MED ORDER — MAGNESIUM SULFATE 2 GM/50ML IV SOLN
2.0000 g | Freq: Once | INTRAVENOUS | Status: AC
  Administered 2024-01-02: 2 g via INTRAVENOUS
  Filled 2024-01-02: qty 50

## 2024-01-02 NOTE — Consult Note (Addendum)
 Cardiology Consultation   Patient ID: [name] MRN: [id number]; DOB: 03/29/1934  Admit date: 01/01/2024 Date of Consult: 01/02/2024  PCP:  Lorenda Ishihara, MD   [facility] Providers Cardiologist:  Donato Schultz, MD     Patient Profile:   [name] is a 89 y.o. female with a hx of breast cancer with METS to liver, peritoneum, spine, prior ovarian cancer, moderate CAD with chronic episodic chest tightness, HTN, hypertrophic cardiomyopathy, chronic HFpEF, paroxysmal atrial fibrillation, GERD, HLD, carotid artery disease who is being seen 01/02/2024 for the evaluation of elevated troponin at the request of Dr. Nelda Severe.  History of Present Illness:   [name] is an 89 year old female with above medical history. Per chart review, patient had a remote cath in 2007 that showed moderate LAD disease with 50% narrowing after D2, 50% prox D2, 30-40% PDA, irregularities in mid RCA, elevated LVEDP with suspected diastolic dysfunction. She has reported episodic chest tightness at outpatient visits since that time. She had a stress test in 2021 that showed small fixed defect in anteroapex and mid anterior wall at stress and rest question breast attenuation artifact, cannot exclude small area of prior infarct, low risk study without ischemia, managed medically. Echocardiogram 03/2022 showed EF 65-70%, grade III DD, mild LVH of the basal septal segment, mildly elevated PASP, mild MR. When last seen by DR. Skains in 02/2023, he noted that she had a history of suspected microvascular disease. Also raised question of GERD/musculoskeletal cuases of chest discomfort.   Patient was admitted twice in 11/2023. Admitted 2/14-2/27 with AKI, creatinine 3.0. In the hospital, she was noted to have atrial fibrillation with RVR. Echocardiogram 11/25/23 showed severe basal septal hypertrophy with maximal septal thickness 20 mm, EF 65-70%, normal RV systolic function. Cardiology was consulted and we  recommended rate control strategy due to her complex advanced oncological disease. She had persistent chest discomfort when swallowing, esophogram showed dysmotility and stenosis. Palliative care was consulted that admission, patient discharged to SNF for rehab.   Patient was again admitted 2/28-3/4. She had presented complaining of palpitations, chest tightness, shortness of breath that occurred suddenly when she was eating. She was found to be in Afib with RVR, HR up to the 150s. Patient was started on IV Cardizem with improvement in HR. Transitioned to PO diltiazem 240 mg daily. She continued to be a poor candidate for [facility] due to her comorbidities and thrombocytopenia.   Patient was seen in clinic by cardiology on 3/18 for a hospital follow up appointment. At that time, patient reported increasing SOB, orthopnea, and lower extremity edema. HR was elevated to the 140s. She was sent to [facility] ED and was admitted until 3/24. During her admission, patient was treated with IV lasix and IV Cardizem. Echocardiogram 12/24/23 showed EF 70-75%, moderate asymmetric LVH, moderate AI, moderately elevated PA systolic pressure. Cardiology was consulted, again recommended rate control. Discharged on aspirin, diltiazem 240 mg daily, lasix 40 mg daily, Imdur 60 mg daily, metoprolol tartrate 25 mg BID.   Patient was brought to the ED via EMS on 3/27. She reported palpitations at her SNF with HR up to 160 BPM. She was given metoprolol with improvement in palpitations and HR. In the ED, initial vital signs showed HR 112 BPM, BP 150/66, oxygen 99% on room air. EKG showed atrial fibrillation with HR 105 BPM. Lab work significant for BNP 2416. hsTn 88>86>90>65. Creatinine 1.06, K 2.9, albumin 2.0, hemoglobin 6.6, platelets 142. In the ED, patient was  given IV lasix 40 mg, potassium and magnesium supplementation, 2 units PRBCs. Started on IV diltiazem. She was admitted to the internal medicine service.  Cardiology consulted for elevated  troponin   On interview, patient reports that she was at her SNF when she developed chest tightness. Symptoms were not worse with exertion and did not improve with rest. Not positional. She had chest tightness for about 1 hour. She went to talk to a nurse about her symptoms who told her that her HR was very elevated. Denies palpitations, and she could not feel that her HR was elevated. She was given metoprolol and her chest tightness improved. She has had similar episodes of chest pain in the past. Previously underwent stress test in 2021 for similar symptoms.   She also has been having lower extremity swelling and has felt like she needed to take deeper breaths to breath recently. Denies cough, orthopnea, abdominal distention   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   Breast cancer (HCC) 2010   RIGHT   CAD (coronary artery disease)    Dysuria    Family history of breast cancer    GERD (gastroesophageal reflux disease)    HX OF   Heart murmur    History of blood transfusion    History of breast cancer ONCOLOGIST-- DR Truett Perna   DX 2010--  RIGHT BREAST DCIS  HIGH GRADE S/P LUMPECTOMY W/ SLN BX--  NO RECURRENCE   History of kidney stones    History of ovarian cancer    2009--  S/P COLON RESECTION FOR PAPILLARY SEROUS CARCINOMA, BORDERLINE OVARIAN CANCER VERSUS PRIMARY PERITONEAL CARCINOMA WITH PSAMMOMA BODIES--  NO RECURRENCE   History of skin cancer    excision basal cell   Hyperlipidemia    Hypertension    Kidney stones 04/2013   Nocturia    Ovarian carcinoma (HCC)    papillary serous-low grade, recurrence found in fat necrosis during ileostomy   Renal stones 2014   Shingles 30 YRS AGO   Ureteral calculi    bilateral    Past Surgical History:  Procedure Laterality Date   APPENDECTOMY     BREAST LUMPECTOMY Right 04-13-2009   W/ SLN BX   BREAST LUMPECTOMY WITH RADIOACTIVE SEED LOCALIZATION Left  06/11/2019   Procedure: LEFT BREAST LUMPECTOMY WITH RADIOACTIVE SEED LOCALIZATION;  Surgeon: Griselda Miner, MD;  Location: [facility] OR;  Service: General;  Laterality: Left;   CARDIAC CATHETERIZATION  11-11-2005 DR Verdis Prime   MODERATE LAD DISEASE/ NORMAL LVF/ EF 65-75%   CATARACT EXTRACTION W/ INTRAOCULAR LENS IMPLANT Bilateral 04/26/2013   CHOLECYSTECTOMY  1990   OPEN   COLONOSCOPY     CYSTOSCOPY W/ URETERAL STENT PLACEMENT Bilateral 05/07/2013   Procedure: CYSTOSCOPY WITH STENT REPLACEMENTS;  Surgeon: Sebastian Ache, MD;  Location[facility];  Service: Urology;  Laterality: Bilateral;   CYSTOSCOPY WITH BIOPSY N/A 04/07/2013   Procedure: CYSTOSCOPY WITH BIOPSY and fulgeration;  Surgeon: Sebastian Ache, MD;  Location: [facility];  Service: Urology;  Laterality: N/A;   CYSTOSCOPY WITH RETROGRADE PYELOGRAM, URETEROSCOPY AND STENT PLACEMENT Bilateral 05/07/2013   Procedure: CYSTOSCOPY WITH RETROGRADE PYELOGRAM, URETEROSCOPY ;  Surgeon: Sebastian Ache, MD;  Location: [facility];  Service: Urology;  Laterality: Bilateral;   CYSTOSCOPY WITH RETROGRADE PYELOGRAM, URETEROSCOPY AND STENT PLACEMENT Bilateral 01/06/2019   Procedure: CYSTOSCOPY WITH RETROGRADE PYELOGRAM, URETEROSCOPY AND STENT PLACEMENT, FIRST STAGE;  Surgeon: Sebastian Ache,  MD;  Location: [facility];  Service: Urology;  Laterality: Bilateral;   CYSTOSCOPY WITH RETROGRADE PYELOGRAM, URETEROSCOPY AND STENT PLACEMENT Bilateral 01/27/2019   Procedure: CYSTOSCOPY WITH RETROGRADE PYELOGRAM, URETEROSCOPY AND STENT PLACEMENT;  Surgeon: Sebastian Ache, MD;  Location: [facility];  Service: Urology;  Laterality: Bilateral;  75 MINS   CYSTOSCOPY WITH STENT PLACEMENT Bilateral 04/07/2013   Procedure: CYSTOSCOPY WITH STENT PLACEMENT;  Surgeon: Sebastian Ache, MD;  Location: [facility];  Service: Urology;  Laterality: Bilateral;   DX LAPAROSCOPY W/ PERITONEAL AND OMENTAL BX'S AND WASHINGS  02-16-2008   EXCISION RIGHT BREAST MASS  02-10-2002   EXP.  LAP. EXTENSIVE ADHESIOLYSIS/ RESECTION TERMINAL ILEUM , ASCENDING AND DESCENDING COLON WITH CREATION ILEOSTOMY AND MUCOUS FISTULA  02-19-2008   PERFERATION AND ABD. CANCER--  TAKEDOWN ILEOSTOMY 08-23-2008   HOLMIUM LASER APPLICATION Bilateral 01/06/2019   Procedure: HOLMIUM LASER APPLICATION;  Surgeon: Sebastian Ache, MD;  Location: [facility];  Service: Urology;  Laterality: Bilateral;   HOLMIUM LASER APPLICATION Bilateral 01/27/2019   Procedure: HOLMIUM LASER APPLICATION;  Surgeon: Sebastian Ache, MD;  Location: [facility];  Service: Urology;  Laterality: Bilateral;   I & D EXTREMITY Right 02/02/2022   Procedure: IRRIGATION AND DEBRIDEMENT HAND;  Surgeon: Bradly Bienenstock, MD;  Location: [facility];  Service: Orthopedics;  Laterality: Right;   ILEOSTOMY CLOSURE  08/2008   TOTAL ABDOMINAL HYSTERECTOMY W/ BILATERAL SALPINGOOPHORECTOMY  1976   TOTAL ABDOMINAL HYSTERECTOMY W/ BILATERAL SALPINGOOPHORECTOMY  1976   VULVAR LESION REMOVAL N/A 08/22/2014   Procedure: EXCISION OF VULVAR CYST  ;  Surgeon: Annamaria Boots, MD;  Location: [facility];  Service: Gynecology;  Laterality: N/A;   WOUND DEBRIDEMENT  05/28/2012   Procedure: DEBRIDEMENT ABDOMINAL WOUND;  Surgeon: Currie Paris, MD;  Location:  [facility]ERY [facility];  Service: General;  Laterality: N/A;  excision chronic wound abdominal wall     Inpatient Medications: Scheduled Meds:  furosemide  40 mg Intravenous BID   potassium chloride SA  40 mEq Oral Once   Continuous Infusions:  diltiazem (CARDIZEM) infusion 10 mg/hr (01/02/24 0438)   PRN Meds: acetaminophen **OR** acetaminophen, nitroGLYCERIN  Allergies:    Allergies  Allergen Reactions   Ramipril Itching    Social History:   Social History   Socioeconomic History   Marital status: Widowed    Spouse name: Not on file   Number of children: Not on file   Years of education: Not on file   Highest education level: Not on file  Occupational History   Not on file  Tobacco Use    Smoking status: Never   Smokeless tobacco: Never  Vaping Use   Vaping status: Never Used  Substance and Sexual Activity   Alcohol use: No   Drug use: No   Sexual activity: Never    Partners: Male    Birth control/protection: Surgical    Comment: Hysterectomy  Other Topics Concern   Not on file  Social History Narrative   Not on file   Social Drivers of Health   Financial Resource Strain: Not on file  Food Insecurity: No Food Insecurity (12/23/2023)   Hunger Vital Sign    Worried About Running Out of Food in the Last Year: Never true    Ran Out of Food in the Last Year: Never true  Transportation Needs: No Transportation Needs (12/23/2023)   PRAPARE - Administrator, Civil Service (Medical): No    Lack of Transportation (Non-Medical): No  Physical Activity: Not on file  Stress: Not  on file  Social Connections: Moderately Isolated (12/23/2023)   Social Connection and Isolation Panel [NHANES]    Frequency of Communication with Friends and Family: More than three times a week    Frequency of Social Gatherings with Friends and Family: Once a week    Attends Religious Services: More than 4 times per year    Active Member of [employer] or Organizations: No    Attends Banker Meetings: Never    Marital Status: Widowed  Intimate Partner Violence: Not At Risk (12/23/2023)   Humiliation, Afraid, Rape, and Kick questionnaire    Fear of Current or Ex-Partner: No    Emotionally Abused: No    Physically Abused: No    Sexually Abused: No    Family History:  Family History  Problem Relation Age of Onset   Heart disease Mother    Hyperlipidemia Mother    Breast cancer Sister    Cancer Sister        breast     ROS:  Please see the history of present illness.  All other ROS reviewed and negative.     Physical Exam/Data:   Vitals:   01/02/24 0700 01/02/24 0730 01/02/24 0852 01/02/24 1000  BP: (!) 155/83 (!) 159/77 (!) 147/58 (!) 174/81  Pulse: 84 88 89 93   Resp: (!) 25 19 17 20   Temp: 97.7 F (36.5 C)  97.7 F (36.5 C)   TempSrc:      SpO2: 100% 99%  100%  Weight:      Height:        Intake/Output Summary (Last 24 hours) at 01/02/2024 1033 Last data filed at 01/02/2024 0438 Gross per 24 hour  Intake 353.45 ml  Output --  Net 353.45 ml      01/01/2024   11:08 PM 12/29/2023    5:24 AM 12/29/2023    3:25 AM  Last 3 Weights  Weight (lbs) 120 lb 5.9 oz 120 lb 5.9 oz 124 lb 9 oz  Weight (kg) 54.6 kg 54.6 kg 56.5 kg     Body mass index is 19.43 kg/m.  General:  Thin, elderly female. Sitting upright in the bed, eating lunch. No acute distress  HEENT: normal Neck: no JVD Vascular: Radial pulses 2+ bilaterally Cardiac:  normal S1, S2;  irregular rate and rhythm; no murmur  Lungs:  crackles in bilateral lung bases. Normal WOB on room air  Abd: soft, nontender  Ext: 2+ edema in BLE  Musculoskeletal:  No deformities  Skin: warm and dry  Neuro:  CNs 2-12 intact, no focal abnormalities noted Psych:  Normal affect   EKG:  The EKG was personally reviewed and demonstrates:  atrial fibrillation with HR 105 BPM Telemetry:  Telemetry was personally reviewed and demonstrates:  Atrial fibrillation, HR in the 70s-90s   Relevant CV Studies: Cardiac Studies & Procedures   ______________________________________________________________________________________________   STRESS TESTS  MYOCARDIAL PERFUSION IMAGING 01/24/2020  Narrative  The left ventricular ejection fraction is hyperdynamic (>65%).  Nuclear stress EF: 74%.  There was no ST segment deviation noted during stress.  This is a low risk study.  Small fixed anterior wall defect in anteroapex and mid anterior wall at stress and rest. With hyperdynamic EF and normal wall motion, this may be secondary to breast attenuation artifact, however cannot exclude small area of prior infarct. No ischemia noted , as rest perfusion defects are great than stress. Overall low risk study.    ECHOCARDIOGRAM  ECHOCARDIOGRAM LIMITED 12/24/2023  Narrative ECHOCARDIOGRAM LIMITED  REPORT    Patient Name:   [name] Date of Exam: 12/24/2023 Medical Rec #:  [id number]        Height:       66.0 in Accession #:    [id number]       Weight:       115.0 lb Date of Birth:  1934-02-21       BSA:          1.581 m Patient Age:    89 years         BP:           158/69 mmHg Patient Gender: F                HR:           80 bpm. Exam Location:  Inpatient  Procedure: Limited Echo, Color Doppler and Cardiac Doppler (Both Spectral and Color Flow Doppler were utilized during procedure).  Indications:    atrial fibrillation  History:        Patient has prior history of Echocardiogram examinations, most recent 11/25/2023. CHF, Cancer. Chronic kidney disease, Arrythmias:Atrial Fibrillation; Risk Factors:Hypertension.  Sonographer:    Delcie Roch RDCS Referring Phys: [id number] VISHAL R PATEL  IMPRESSIONS   1. Left ventricular ejection fraction, by estimation, is 70 to 75%. The left ventricle has hyperdynamic function. There is moderate asymmetric left ventricular hypertrophy of the basal-septal segment. 2. The mitral valve is degenerative. Trivial mitral valve regurgitation. Moderate mitral annular calcification. 3. The aortic valve is tricuspid. There is mild calcification of the aortic valve. There is mild thickening of the aortic valve. Aortic valve regurgitation is moderate. Aortic regurgitation PHT measures 440 msec. 4. There is moderately elevated pulmonary artery systolic pressure. 5. The inferior vena cava is dilated in size with >50% respiratory variability, suggesting right atrial pressure of 8 mmHg.  FINDINGS Left Ventricle: Left ventricular ejection fraction, by estimation, is 70 to 75%. The left ventricle has hyperdynamic function. There is moderate asymmetric left ventricular hypertrophy of the basal-septal segment.  Right Ventricle: There is moderately elevated  pulmonary artery systolic pressure. The tricuspid regurgitant velocity is 3.43 m/s, and with an assumed right atrial pressure of 8 mmHg, the estimated right ventricular systolic pressure is 55.1 mmHg.  Mitral Valve: The mitral valve is degenerative in appearance. There is mild thickening of the mitral valve leaflet(s). There is mild calcification of the mitral valve leaflet(s). Moderate mitral annular calcification. Trivial mitral valve regurgitation.  Tricuspid Valve: Tricuspid valve regurgitation is mild.  Aortic Valve: The aortic valve is tricuspid. There is mild calcification of the aortic valve. There is mild thickening of the aortic valve. Aortic valve regurgitation is moderate. Aortic regurgitation PHT measures 440 msec.  Pulmonic Valve: Pulmonic valve regurgitation is trivial.  Venous: The inferior vena cava is dilated in size with greater than 50% respiratory variability, suggesting right atrial pressure of 8 mmHg.  Additional Comments: Spectral Doppler performed. Color Doppler performed.  LEFT VENTRICLE PLAX 2D LVIDd:         3.20 cm LVIDs:         1.70 cm LV PW:         1.01 cm LV IVS:        1.57 cm   IVC IVC diam: 2.20 cm  LEFT ATRIUM         Index LA diam:    4.40 cm 2.78 cm/m AORTIC VALVE LVOT Vmax:   90.70 cm/s LVOT Vmean:  60.200 cm/s LVOT VTI:  0.194 m AI PHT:      440 msec  TRICUSPID VALVE TR Peak grad:   47.1 mmHg TR Vmax:        343.00 cm/s  SHUNTS Systemic VTI: 0.19 m  Chilton Si MD Electronically signed by Chilton Si MD Signature Date/Time: 12/24/2023/5:38:46 PM    Final          ______________________________________________________________________________________________       Laboratory Data:  High Sensitivity Troponin:   Recent Labs  Lab 12/23/23 1837 01/01/24 2356 01/02/24 0319 01/02/24 0445 01/02/24 0735  TROPONINIHS 53* 88* 86* 90* 65*     Chemistry Recent Labs  Lab 12/29/23 0332 01/01/24 2356  01/02/24 0319 01/02/24 0445  NA 140 142  --  143  K 3.9 2.9*  --  5.0  CL 110 114*  --  111  CO2 26 21*  --  22  GLUCOSE 122* 163*  --  110*  BUN 26* 27*  --  25*  CREATININE 1.10* 1.06*  --  1.06*  CALCIUM 7.5* 7.5*  --  8.2*  MG  --   --  1.9 1.9  GFRNONAA 49* 51*  --  51*  ANIONGAP 4* 7  --  10    Recent Labs  Lab 01/01/24 2356  PROT 4.5*  ALBUMIN 2.0*  AST 26  ALT 32  ALKPHOS 96  BILITOT 0.6   Lipids No results for input(s): "CHOL", "TRIG", "HDL", "LABVLDL", "LDLCALC", "CHOLHDL" in the last 168 hours.  Hematology Recent Labs  Lab 12/28/23 0306 12/28/23 1201 01/01/24 2356 01/02/24 0445  WBC 5.8  --  6.9 9.6  RBC 2.20*  --  1.97* 2.59*  HGB 7.4* 8.5* 6.6* 8.5*  HCT 22.5* 26.1* 21.1* 27.4*  MCV 102.3*  --  107.1* 105.8*  MCH 33.6  --  33.5 32.8  MCHC 32.9  --  31.3 31.0  RDW 17.0*  --  16.1* 16.1*  PLT 121*  --  142* 210   Thyroid No results for input(s): "TSH", "FREET4" in the last 168 hours.  BNP Recent Labs  Lab 01/01/24 2356  BNP 2,416.8*    DDimer  Recent Labs  Lab 01/02/24 0445  DDIMER 3.21*     Radiology/Studies:  DG Chest 2 View Result Date: 01/01/2024 CLINICAL DATA:  Chest pressure and tachycardia EXAM: CHEST - 2 VIEW COMPARISON:  11/25/2023 FINDINGS: Cardiac shadow is stable. Aortic calcifications are noted. Small effusions are again seen bilaterally as well as basilar atelectasis. Mild increased vascular congestion is again noted. No bony abnormality is noted. IMPRESSION: Changes of CHF with small effusions bilaterally. Electronically Signed   By: Alcide Clever M.D.   On: 01/01/2024 23:49     Assessment and Plan:   Chest Tightness  Elevated Troponin  CAD  - Remote cath in 2007 that showed moderate LAD disease with 50% narrowing after D2, 50% prox D2, 30-40% PDA, irregularities in mid RCA. Later had a stress test in 2021 that was a low risk study without evidence of ischemia  - Patient has chronic episodic chest tightness, thought to be  due to microvascular disease. GERD may be contributing as well  - Patient currently admitted with symptomatic anemia with hemoglobin 6.6 on arrival. Patient was also in afib with RVR on arrival, HR up to the 130s - Patient reported chest tightness that was not worse with exertion nor improved with rest. Not positional. Felt similar to her chest tightness in the past  - hsTn 88>86>90>65  - Patient reports that chest  tightness improved when given metoprolol. She is currently chest pain free  - Suspect symptoms and trop elevation are due to symptomatic anemia, afib with RVR. No plans for ischemic evaluation at this time, patient is not a candidate for aggressive cardiac interventions  - Continue imdur 60 mg daily, metoprolol tartate 25 mg BID  - Continue ASA 81 mg daily  - Continue crestor 10 mg daily  - Recent echocardiogram from 3/19 showed EF 70-75%. No need to repeat at this time   Persistent atrial fibrillation  - Due to patient's anemia, comorbidities/metastatic cancer, she has not been started on anticoagulation. We have been managing afib with a rate control strategy  - Patient reportedly developed palpitations while at her SNF. Staff there reported that her HR was in the 160s. She was given metoprolol with improvement in HR  - Now, patient on IV diltiazem. HR improved  - Suspect elevated HR was due to CHF exacerbation and anemia. She also had hypokalemia with K 2.9 on arrival   - Transition from IV diltiazem to PO diltiazem 300 mg daily  - Resume home metoprolol tartrate 25 mg BID. Titrate as needed for tachycardia  Acute on chronic diastolic heart failure  Moderate AI  - Most recent echocardiogram from 3/19 showed EF 70-75%, moderate asymmetric LVH, moderate AI  - On arrival to the ED, BNP elevated to 2416. CXR showed small bilateral pleural effusions - Now on IV lasix 40 mg BID - reports good urine output  - Continues to have lower extremity edema on exam. Continue IV lasix  - Note,  albumin 2.0. This is also likely contributing to lower extremity edema   Otherwise per primary  - symptomatic macrocytic anemia- hemoglobin 6.6 on arrival, improved to 8.5 after 2 units PRBC  - Hypokalemia- K 2.9 on arrival, has been supplemented by primary  - Metastatic breast cancer    Risk Assessment/Risk Scores:      New York Heart Association (NYHA) Functional Class NYHA Class III  CHA2DS2-VASc Score = 6   This indicates a 9.7% annual risk of stroke. The patient's score is based upon: CHF History: 1 (probable chronic HFpEF per notes) HTN History: 1 Diabetes History: 0 Stroke History: 0 Vascular Disease History: 1 Age Score: 2 Gender Score: 1    For questions or updates, please contact [facility] Please consult [email or web address] for contact info under    Signed, Jonita Albee, PA-C  01/02/2024 10:33 AM  Patient seen, examined. Available data reviewed. Agree with findings, assessment, and plan as outlined by Marijean Niemann, PA-C.  Her acute cardiac-related hospital problems include persistent atrial fibrillation, acute on chronic HFpEF, and type II MI from demand ischemia in the setting of symptomatic anemia.  The patient is independently interviewed and examined.  She is a delightful elderly woman in no distress.  She feels well and has had no recurrent chest pain or shortness of breath.  She is alert, oriented, and comfortable appearing.  HEENT is normal, JVP is normal, lungs are diminished in the bases but otherwise clear, heart is irregularly irregular with no murmur gallop, abdomen is soft and nontender with no palpable mass, extremities have 2+ pretibial and ankle edema which the patient states is chronic.  Telemetry reveals atrial fibrillation with controlled ventricular rate, heart rate in the 70s and 80s at present.  Troponin is mildly elevated and a flat trend ranging from 90 down to 65 which is the most current reading.  BNP is elevated at 2417.  The patient  appears to have suffered from demand ischemia (type II non-STEMI) in the setting of symptomatic anemia hemoglobin 6.6.  Now that she has received packed red blood cell transfusion her symptoms have resolved.  I have reviewed recent EKGs and she has remained in atrial fibrillation over the past few months.  She has not been felt to be a candidate for anticoagulation due to her severe anemia, advanced age, frailty, and metastatic cancer.  I would agree that she continues to be a poor candidate for oral anticoagulation and I think the risk outweighs potential benefit.  Echocardiogram from 12/24/2023 reviewed and shows vigorous LV EF of 70 to 75% with basal septal hypertrophy, trivial mitral regurgitation, moderate aortic insufficiency, and moderately elevated PA pressure.  I think it is appropriate to diurese the patient with IV Lasix as you are doing.  She is very eager to return to her rehab facility and ultimately home.  Recommend continuing IV furosemide 40 mg twice daily, continue metoprolol and diltiazem for heart rate control, transition patient back to oral furosemide tomorrow at an increased dose of 40 mg twice daily and increase potassium chloride to 20 mill equivalents twice daily.  Hopefully she can return home tomorrow if she is medically stable.  I think from a cardiac standpoint it would be acceptable to send her home tomorrow with the regimen outlined above.  Tonny Bollman, M.D. 01/02/2024 3:18 PM

## 2024-01-02 NOTE — ED Notes (Signed)
 Pt c/o midsternal chest pressure rated 5/10, provider paged.

## 2024-01-02 NOTE — Progress Notes (Signed)
 Patient had recent echo 12/24/23.  Please contact echo department if repeat is necessary.

## 2024-01-02 NOTE — Progress Notes (Addendum)
 Patient is complaining chest pressure 5 out of 10.  Still persisting atrial fibrillation heart rate 112.  EKG showing atrial fibrillation.  Patient being admitted for A-fib RVR and elevated troponin in the context of demand ischemia. Concern for chest pressure and elevated troponin secondary to demand ischemia in the context of A-fib RVR and acute CHF exacerbation. -Initial troponin is 86.  Continue to trend troponin. - Continue Cardizem drip. -Obtaining echocardiogram to rule out any wall motion abnormality. -Patient also symptomatic anemia hemoglobin 6.6.  Currently getting transfusion of 1 unit of blood.  Tereasa Coop, MD [facility] 01/02/2024, 5:02 AM

## 2024-01-02 NOTE — ED Notes (Signed)
 Pt cleaned and new chuck pads placed.

## 2024-01-02 NOTE — Plan of Care (Signed)
  Problem: Clinical Measurements: ?Goal: Ability to maintain clinical measurements within normal limits will improve ?Outcome: Progressing ?  ?Problem: Nutrition: ?Goal: Adequate nutrition will be maintained ?Outcome: Progressing ?  ?Problem: Skin Integrity: ?Goal: Risk for impaired skin integrity will decrease ?Outcome: Progressing ?  ?

## 2024-01-02 NOTE — ED Notes (Signed)
 Pt brief changed.

## 2024-01-02 NOTE — Progress Notes (Signed)
 Heart Failure Navigator Progress Note  Assessed for Heart & Vascular TOC clinic readiness.  Patient does not meet criteria due to Metastatic cancer per MD note. No HF TOC. .   Navigator will sign off at this time.    Rhae Hammock, BSN, Scientist, clinical (histocompatibility and immunogenetics) Only

## 2024-01-02 NOTE — H&P (Signed)
 History and Physical    [name] YTK:[id number] DOB: 1934/02/14 DOA: 01/01/2024  PCP: Alison Ishihara, MD  Chief Complaint: Chest pressure  HPI: [name] is a 89 y.o. female with medical history significant of stage IV breast cancer (widely metastatic to lung, liver, spine, ribs, peritoneum), persistent A-fib not on anticoagulation, CAD, chronic HFpEF, hypertrophic cardiomyopathy, hypertension, CKD stage IIIa, anemia, hypertension, GERD.  Recent admission 2/28-3/4 for A-fib with RVR and AKI.  Admitted again 3/18-3/24 for A-fib with RVR and decompensated heart failure.  Patient presents to the ED with via EMS for evaluation of chest pressure and A-fib with RVR with heart rate up to 160s at her facility.  She received metoprolol 25 mg at her facility prior to arrival and was given 200 mL IV fluids by EMS.  Vital signs on arrival to the ED: Temperature 97.5 F, pulse 112, respiratory rate 18, blood pressure 150/66, and SpO2 99% on room air.  Labs notable for hemoglobin 6.6 (previously 8.5 on labs 5 days ago), MCV 107.1, platelet count 142k (stable), potassium 2.9, creatinine 1.0, initial troponin 88 and repeat pending, BNP 2416.  Chest x-ray showing mild increased vascular congestion and small bilateral pleural effusions.  EKG done in the ED currently not available for personal review. Patient was given IV Lasix 40 mg, oral potassium 40 mEq, IV potassium 10 mg x 3, and IV mag 2 g in the ED.  2 units PRBCs ordered.  TRH called to admit.  Patient states yesterday afternoon she started having sudden onset substernal chest pressure.  States the nurse at her facility checked her heart rate at that time and it was up to 160s so she was given a dose of her home metoprolol and sent to the ED for further evaluation.  She is no longer having any chest pressure/pain or discomfort at this time.  She is receiving Lasix 40 mg daily at her facility but continues to have significant bilateral lower  extremity edema and is also endorsing orthopnea.  Patient denies hematemesis, hematochezia, melena, hematuria, or any other obvious bleeding.  Denies abdominal pain.  Reports history of chronic diarrhea since after she had intestinal surgery done several years ago.  Review of Systems:  Review of Systems  All other systems reviewed and are negative.   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   Breast cancer (HCC) 2010   RIGHT   CAD (coronary artery disease)    Dysuria    Family history of breast cancer    GERD (gastroesophageal reflux disease)    HX OF   Heart murmur    History of blood transfusion    History of breast cancer ONCOLOGIST-- DR Truett Perna   DX 2010--  RIGHT BREAST DCIS  HIGH GRADE S/P LUMPECTOMY W/ SLN BX--  NO RECURRENCE   History of kidney stones    History of ovarian cancer    2009--  S/P COLON RESECTION FOR PAPILLARY SEROUS CARCINOMA, BORDERLINE OVARIAN CANCER VERSUS PRIMARY PERITONEAL CARCINOMA WITH PSAMMOMA BODIES--  NO RECURRENCE   History of skin cancer    excision basal cell   Hyperlipidemia    Hypertension    Kidney stones 04/2013   Nocturia    Ovarian carcinoma (HCC)    papillary serous-low grade, recurrence found in fat necrosis during ileostomy   Renal stones 2014   Shingles 30 YRS AGO   Ureteral calculi  bilateral    Past Surgical History:  Procedure Laterality Date   APPENDECTOMY     BREAST LUMPECTOMY Right 04-13-2009   W/ SLN BX   BREAST LUMPECTOMY WITH RADIOACTIVE SEED LOCALIZATION Left 06/11/2019   Procedure: LEFT BREAST LUMPECTOMY WITH RADIOACTIVE SEED LOCALIZATION;  Surgeon: Griselda Miner, MD;  Location: [facility] OR;  Service: General;  Laterality: Left;   CARDIAC CATHETERIZATION  11-11-2005 DR Verdis Prime   MODERATE LAD DISEASE/ NORMAL LVF/ EF 65-75%   CATARACT EXTRACTION W/ INTRAOCULAR LENS IMPLANT Bilateral 04/26/2013   CHOLECYSTECTOMY  1990   OPEN   COLONOSCOPY      CYSTOSCOPY W/ URETERAL STENT PLACEMENT Bilateral 05/07/2013   Procedure: CYSTOSCOPY WITH STENT REPLACEMENTS;  Surgeon: Sebastian Ache, MD;  Location: [facility];  Service: Urology;  Laterality: Bilateral;   CYSTOSCOPY WITH BIOPSY N/A 04/07/2013   Procedure: CYSTOSCOPY WITH BIOPSY and fulgeration;  Surgeon: Sebastian Ache, MD;  Location: [facility];  Service: Urology;  Laterality: N/A;   CYSTOSCOPY WITH RETROGRADE PYELOGRAM, URETEROSCOPY AND STENT PLACEMENT Bilateral 05/07/2013   Procedure: CYSTOSCOPY WITH RETROGRADE PYELOGRAM, URETEROSCOPY ;  Surgeon: Sebastian Ache, MD;  Location: [facility];  Service: Urology;  Laterality: Bilateral;   CYSTOSCOPY WITH RETROGRADE PYELOGRAM, URETEROSCOPY AND STENT PLACEMENT Bilateral 01/06/2019   Procedure: CYSTOSCOPY WITH RETROGRADE PYELOGRAM, URETEROSCOPY AND STENT PLACEMENT, FIRST STAGE;  Surgeon: Sebastian Ache, MD;  Location: [facility];  Service: Urology;  Laterality: Bilateral;   CYSTOSCOPY WITH RETROGRADE PYELOGRAM, URETEROSCOPY AND STENT PLACEMENT Bilateral 01/27/2019   Procedure: CYSTOSCOPY WITH RETROGRADE PYELOGRAM, URETEROSCOPY AND STENT PLACEMENT;  Surgeon: Sebastian Ache, MD;  Location: [facility];  Service: Urology;  Laterality: Bilateral;  75 MINS   CYSTOSCOPY WITH STENT PLACEMENT Bilateral 04/07/2013   Procedure: CYSTOSCOPY WITH STENT PLACEMENT;  Surgeon: Sebastian Ache, MD;  Location: [facility];  Service: Urology;  Laterality: Bilateral;   DX LAPAROSCOPY W/ PERITONEAL AND OMENTAL BX'S AND WASHINGS  02-16-2008   EXCISION RIGHT BREAST MASS  02-10-2002   EXP. LAP. EXTENSIVE ADHESIOLYSIS/ RESECTION TERMINAL ILEUM , ASCENDING AND DESCENDING COLON WITH CREATION ILEOSTOMY AND MUCOUS FISTULA  02-19-2008   PERFERATION AND ABD. CANCER--  TAKEDOWN ILEOSTOMY 08-23-2008   HOLMIUM LASER APPLICATION Bilateral 01/06/2019   Procedure: HOLMIUM LASER APPLICATION;  Surgeon: Sebastian Ache, MD;  Location: [facility];  Service: Urology;  Laterality: Bilateral;    HOLMIUM LASER APPLICATION Bilateral 01/27/2019   Procedure: HOLMIUM LASER APPLICATION;  Surgeon: Sebastian Ache, MD;  Location: [facility];  Service: Urology;  Laterality: Bilateral;   I & D EXTREMITY Right 02/02/2022   Procedure: IRRIGATION AND DEBRIDEMENT HAND;  Surgeon: Bradly Bienenstock, MD;  Location: [facility];  Service: Orthopedics;  Laterality: Right;   ILEOSTOMY CLOSURE  08/2008   TOTAL ABDOMINAL HYSTERECTOMY W/ BILATERAL SALPINGOOPHORECTOMY  1976   TOTAL ABDOMINAL HYSTERECTOMY W/ BILATERAL SALPINGOOPHORECTOMY  1976   VULVAR LESION REMOVAL N/A 08/22/2014   Procedure: EXCISION OF VULVAR CYST  ;  Surgeon: Annamaria Boots, MD;  Location: [facility];  Service: Gynecology;  Laterality: N/A;   WOUND DEBRIDEMENT  05/28/2012   Procedure: DEBRIDEMENT ABDOMINAL WOUND;  Surgeon: Currie Paris, MD;  Location: [facility];  Service: General;  Laterality: N/A;  excision chronic wound abdominal wall     reports that she has never smoked. She has never used smokeless tobacco. She reports that she does not drink alcohol and does not use drugs.  Allergies  Allergen Reactions   Ramipril Itching    Family History  Problem Relation Age of Onset  Heart disease Mother    Hyperlipidemia Mother    Breast cancer Sister    Cancer Sister        breast    Prior to Admission medications   Medication Sig Start Date End Date Taking? Authorizing Provider  Amino Acids-Protein Hydrolys (PRO-STAT) LIQD Take 30 mLs by mouth in the morning and at bedtime.    [provider]  aspirin EC 81 MG tablet Take 81 mg by mouth daily.    [provider]  BELSOMRA 10 MG TABS Take 10 mg by mouth at bedtime. 01/20/19   [provider]  Calcium Carbonate-Vitamin D (CALTRATE 600+D PO) Take 1 tablet by mouth 2 (two) times daily.    [provider]  Cholecalciferol (VITAMIN D3) 50 MCG (2000 UT) TABS Take 2,000 Units by mouth in the morning and at bedtime.    [provider]   cyanocobalamin (VITAMIN B12) 1000 MCG tablet Take 1,000 mcg by mouth daily.    [provider]  diltiazem (CARDIZEM CD) 240 MG 24 hr capsule Take 1 capsule (240 mg total) by mouth daily. 12/09/23   Almon Hercules, MD  furosemide (LASIX) 40 MG tablet Take 1 tablet (40 mg total) by mouth daily. 12/29/23   Lanae Boast, MD  hydroxychloroquine (PLAQUENIL) 200 MG tablet Take 200 mg by mouth daily. 11/08/21   [provider]  isosorbide mononitrate (IMDUR) 60 MG 24 hr tablet Take 1 tablet (60 mg total) by mouth daily. 05/16/23   Jake Bathe, MD  loperamide (IMODIUM) 2 MG capsule Take 1 capsule (2 mg total) by mouth as needed for diarrhea or loose stools. 12/04/23   Briant Cedar, MD  melatonin 3 MG TABS tablet Take 1 tablet (3 mg total) by mouth at bedtime as needed. 12/29/23   Lanae Boast, MD  metoprolol tartrate (LOPRESSOR) 25 MG tablet Take 1 tablet (25 mg total) by mouth 2 (two) times daily. Patient taking differently: Take 25-50 mg by mouth See admin instructions. Take 2 tablets by mouth in the moring ansd 2 tablets in the evening for Afib 12/09/23   Rai, Ripudeep K, MD  Multiple Vitamins-Minerals (MULTIVITAMIN WOMEN 50+) TABS Take 1 tablet by mouth daily with breakfast. Decubi-Vite    [provider]  pantoprazole (PROTONIX) 20 MG tablet Take 1 tablet (20 mg total) by mouth daily. 12/05/23   Briant Cedar, MD  potassium chloride (KLOR-CON) 20 MEQ packet Take 20 mEq by mouth daily. 12/29/23   Lanae Boast, MD  PROCTOZONE-HC 2.5 % rectal cream PLACE 1 APPLICATION RECTALLY 2 TIMES A DAY AS NEEDED FOR HEMORRHOIDS. Patient taking differently: Place 1 application  rectally 2 (two) times daily as needed for hemorrhoids. 01/09/15   Cross, Efraim Kaufmann D, NP  rosuvastatin (CRESTOR) 10 MG tablet Take 10 mg by mouth at bedtime.    [provider]  sodium bicarbonate 650 MG tablet Take 1 tablet (650 mg total) by mouth 2 (two) times daily. 12/08/23   Almon Hercules, MD  sucralfate  (CARAFATE) 1 GM/10ML suspension Take 10 mLs (1 g total) by mouth 4 (four) times daily -  with meals and at bedtime. Patient taking differently: Take 1 g by mouth with breakfast, with lunch, and with evening meal. 12/04/23   Briant Cedar, MD  tiZANidine (ZANAFLEX) 4 MG tablet Take 4 mg by mouth 2 (two) times daily as needed for muscle spasms. 10/10/23   [provider]  triamcinolone (KENALOG) 0.1 % Apply 1 application  topically as  needed (for rashes- affected areas). 04/04/20   [provider]  Wound Dressings (SONAFINE) Apply 1 Application topically 2 (two) times daily as needed (Apply to areas of redness from radiation as needed. Call Dr. Colletta Maryland nurse at [phone] if you have any questions). 12/04/23   Briant Cedar, MD    Physical Exam: Vitals:   01/01/24 2308 01/01/24 2351 01/01/24 2352 01/02/24 0200  BP:  (!) 157/104  (!) 143/76  Pulse:   (!) 117 85  Resp:  (!) 23 (!) 23 17  Temp:      SpO2:   100% 100%  Weight: 54.6 kg     Height: 5\' 6"  (1.676 m)       Physical Exam Vitals reviewed.  Constitutional:      General: She is not in acute distress. HENT:     Head: Normocephalic and atraumatic.  Eyes:     Extraocular Movements: Extraocular movements intact.  Cardiovascular:     Rate and Rhythm: Tachycardia present. Rhythm irregular.     Pulses: Normal pulses.  Pulmonary:     Effort: Pulmonary effort is normal. No respiratory distress.     Breath sounds: No wheezing, rhonchi or rales.  Abdominal:     General: Bowel sounds are normal. There is no distension.     Palpations: Abdomen is soft.     Tenderness: There is no abdominal tenderness. There is no guarding.  Musculoskeletal:     Cervical back: Normal range of motion.     Right lower leg: Edema present.     Left lower leg: Edema present.     Comments: 4+ pitting edema of bilateral lower legs  Skin:    General: Skin is warm and dry.  Neurological:     General: No focal deficit present.      Mental Status: She is alert and oriented to person, place, and time.     Labs on Admission: I have personally reviewed following labs and imaging studies  CBC: Recent Labs  Lab 12/26/23 0843 12/27/23 0310 12/28/23 0306 12/28/23 1201 01/01/24 2356  WBC 8.2 6.2 5.8  --  6.9  NEUTROABS  --   --   --   --  5.8  HGB 8.2* 7.6* 7.4* 8.5* 6.6*  HCT 24.8* 23.1* 22.5* 26.1* 21.1*  MCV 101.6* 101.3* 102.3*  --  107.1*  PLT 122* 115* 121*  --  142*   Basic Metabolic Panel: Recent Labs  Lab 12/26/23 0843 12/27/23 0310 12/28/23 0306 12/29/23 0332 01/01/24 2356  NA 141 139 139 140 142  K 3.4* 3.4* 3.3* 3.9 2.9*  CL 107 106 107 110 114*  CO2 25 27 26 26  21*  GLUCOSE 93 119* 115* 122* 163*  BUN 34* 32* 28* 26* 27*  CREATININE 1.11* 1.13* 1.16* 1.10* 1.06*  CALCIUM 7.7* 7.6* 7.4* 7.5* 7.5*  MG 1.5*  --   --   --   --    GFR: Estimated Creatinine Clearance: 32.2 mL/min (A) (by C-G formula based on SCr of 1.06 mg/dL (H)). Liver Function Tests: Recent Labs  Lab 01/01/24 2356  AST 26  ALT 32  ALKPHOS 96  BILITOT 0.6  PROT 4.5*  ALBUMIN 2.0*   Recent Labs  Lab 01/01/24 2356  LIPASE 28   No results for input(s): "AMMONIA" in the last 168 hours. Coagulation Profile: No results for input(s): "INR", "PROTIME" in the last 168 hours. Cardiac Enzymes: No results for input(s): "CKTOTAL", "CKMB", "CKMBINDEX", "TROPONINI" in the last 168 hours. BNP (last  3 results) No results for input(s): "PROBNP" in the last 8760 hours. HbA1C: No results for input(s): "HGBA1C" in the last 72 hours. CBG: No results for input(s): "GLUCAP" in the last 168 hours. Lipid Profile: No results for input(s): "CHOL", "HDL", "LDLCALC", "TRIG", "CHOLHDL", "LDLDIRECT" in the last 72 hours. Thyroid Function Tests: No results for input(s): "TSH", "T4TOTAL", "FREET4", "T3FREE", "THYROIDAB" in the last 72 hours. Anemia Panel: No results for input(s): "VITAMINB12", "FOLATE", "FERRITIN", "TIBC", "IRON",  "RETICCTPCT" in the last 72 hours. Urine analysis:    Component Value Date/Time   COLORURINE YELLOW 11/21/2023 1840   APPEARANCEUR CLEAR 11/21/2023 1840   LABSPEC 1.016 11/21/2023 1840   PHURINE 5.0 11/21/2023 1840   GLUCOSEU NEGATIVE 11/21/2023 1840   HGBUR NEGATIVE 11/21/2023 1840   BILIRUBINUR NEGATIVE 11/21/2023 1840   BILIRUBINUR n 09/15/2015 1123   KETONESUR NEGATIVE 11/21/2023 1840   PROTEINUR NEGATIVE 11/21/2023 1840   UROBILINOGEN negative 09/15/2015 1123   UROBILINOGEN 0.2 04/11/2009 1015   NITRITE NEGATIVE 11/21/2023 1840   LEUKOCYTESUR NEGATIVE 11/21/2023 1840    Radiological Exams on Admission: DG Chest 2 View Result Date: 01/01/2024 CLINICAL DATA:  Chest pressure and tachycardia EXAM: CHEST - 2 VIEW COMPARISON:  11/25/2023 FINDINGS: Cardiac shadow is stable. Aortic calcifications are noted. Small effusions are again seen bilaterally as well as basilar atelectasis. Mild increased vascular congestion is again noted. No bony abnormality is noted. IMPRESSION: Changes of CHF with small effusions bilaterally. Electronically Signed   By: Alcide Clever M.D.   On: 01/01/2024 23:49    Assessment and Plan  Persistent A-fib with RVR Rapid ventricular rate likely related to worsening anemia and hypokalemia.  PE less likely given no hypoxia.  Patient did receive a dose of her home metoprolol prior to arrival and rate currently fluctuating between 120-160s on the monitor.  Blood pressure stable.  She is not on chronic anticoagulation due to anemia, age, and widely metastatic breast cancer.  Start Cardizem drip and consult cardiology in the morning.  Monitor potassium and magnesium levels, continue to replace as needed.  Check D-dimer level.  Acute on chronic HFpEF Moderate aortic regurgitation Patient is presenting with fluid overload.  She has significant 4+ pitting edema of her bilateral lower legs and chest x-ray showing mild increased vascular congestion and small bilateral pleural  effusions.  BNP 2416.  She is not hypoxic.  Recent echo done 9 days ago showing EF 70-75%, trivial mitral regurgitation, and moderate aortic regurgitation.  Continue diuresis with IV Lasix 40 mg twice daily.  Monitor intake and output, daily weights, and renal function.  Low-sodium diet with fluid restriction.  Consult cardiology in the morning.  Substernal chest pressure Elevated troponin History of CAD Chest pressure likely related to rapid A-fib and worsening anemia.  Patient denies any chest pain or pressure at this time.  Initial troponin 88, likely elevated due to demand ischemia.  Repeat troponin pending.  Symptomatic macrocytic anemia Hemoglobin currently 6.6 and was previously 8.5 on labs 5 days ago.  Patient denies any symptoms of GI bleed or any other obvious bleeding.  No abdominal pain.  She is not on chronic anticoagulation.  Type and screen.  2 units PRBCs were ordered in the ED and not administered yet, will change to 1 unit PRBCs instead and follow-up posttransfusion H&H.  Continue to transfuse if hemoglobin less than 7.  FOBT ordered.  Hypokalemia Likely related to diuretic use and chronic diarrhea.  Monitor potassium and magnesium levels, continue to replace as needed.  Hypertension  Started on Cardizem drip, monitor blood pressure.  Hold home metoprolol and Imdur at this time.  CKD stage IIIa Creatinine stable.  Continue to monitor renal function.  Metastatic breast cancer Patient completed radiation treatment on 12/03/2023 and currently on Faslodex.  Outpatient oncology follow-up.  DVT prophylaxis: No anticoagulation due to worsening anemia.  Avoiding SCDs until D-dimer is checked. Code Status: DNR/DNI (discussed with the patient) Family Communication: Sister at bedside. Level of care: Progressive Care Unit Admission status: It is my clinical opinion that referral for OBSERVATION is reasonable and necessary in this patient based on the above information provided. The  aforementioned taken together are felt to place the patient at high risk for further clinical deterioration. However, it is anticipated that the patient may be medically stable for discharge from the hospital within 24 to 48 hours.  John Giovanni MD [facility]  If 7PM-7AM, please contact night-coverage [email or web address]  01/02/2024, 2:13 AM

## 2024-01-03 DIAGNOSIS — I4891 Unspecified atrial fibrillation: Secondary | ICD-10-CM | POA: Diagnosis not present

## 2024-01-03 LAB — COMPREHENSIVE METABOLIC PANEL WITH GFR
ALT: 28 U/L (ref 0–44)
AST: 21 U/L (ref 15–41)
Albumin: 2 g/dL — ABNORMAL LOW (ref 3.5–5.0)
Alkaline Phosphatase: 88 U/L (ref 38–126)
Anion gap: 10 (ref 5–15)
BUN: 23 mg/dL (ref 8–23)
CO2: 20 mmol/L — ABNORMAL LOW (ref 22–32)
Calcium: 7.6 mg/dL — ABNORMAL LOW (ref 8.9–10.3)
Chloride: 111 mmol/L (ref 98–111)
Creatinine, Ser: 0.93 mg/dL (ref 0.44–1.00)
GFR, Estimated: 59 mL/min — ABNORMAL LOW (ref >60)
Glucose, Bld: 120 mg/dL — ABNORMAL HIGH (ref 70–99)
Potassium: 3.3 mmol/L — ABNORMAL LOW (ref 3.5–5.1)
Sodium: 141 mmol/L (ref 135–145)
Total Bilirubin: 0.8 mg/dL (ref 0.0–1.2)
Total Protein: 4.7 g/dL — ABNORMAL LOW (ref 6.5–8.1)

## 2024-01-03 LAB — BPAM RBC
Blood Product Expiration Date: 202504132359
ISSUE DATE / TIME: 202503280530
Unit Type and Rh: 8400

## 2024-01-03 LAB — TYPE AND SCREEN
ABO/RH(D): AB POS
Antibody Screen: NEGATIVE
Unit division: 0

## 2024-01-03 LAB — MAGNESIUM: Magnesium: 1.2 mg/dL — ABNORMAL LOW (ref 1.7–2.4)

## 2024-01-03 LAB — CBC
HCT: 32.3 % — ABNORMAL LOW (ref 36.0–46.0)
Hemoglobin: 10.1 g/dL — ABNORMAL LOW (ref 12.0–15.0)
MCH: 31.7 pg (ref 26.0–34.0)
MCHC: 31.3 g/dL (ref 30.0–36.0)
MCV: 101.3 fL — ABNORMAL HIGH (ref 80.0–100.0)
Platelets: 152 10*3/uL (ref 150–400)
RBC: 3.19 MIL/uL — ABNORMAL LOW (ref 3.87–5.11)
RDW: 18.6 % — ABNORMAL HIGH (ref 11.5–15.5)
WBC: 7.9 10*3/uL (ref 4.0–10.5)
nRBC: 0 % (ref 0.0–0.2)

## 2024-01-03 MED ORDER — MAGNESIUM SULFATE 2 GM/50ML IV SOLN
2.0000 g | Freq: Once | INTRAVENOUS | Status: AC
  Administered 2024-01-03: 2 g via INTRAVENOUS
  Filled 2024-01-03: qty 50

## 2024-01-03 MED ORDER — FUROSEMIDE 40 MG PO TABS: 40.0000 mg | ORAL_TABLET | Freq: Every day | ORAL | 0 refills | Status: DC

## 2024-01-03 MED ORDER — POTASSIUM CHLORIDE CRYS ER 20 MEQ PO TBCR
40.0000 meq | EXTENDED_RELEASE_TABLET | Freq: Once | ORAL | Status: AC
  Administered 2024-01-03: 40 meq via ORAL
  Filled 2024-01-03: qty 2

## 2024-01-03 MED ORDER — DILTIAZEM HCL ER COATED BEADS 300 MG PO CP24: 300.0000 mg | ORAL_CAPSULE | Freq: Every day | ORAL | 0 refills | Status: DC

## 2024-01-03 MED ORDER — FUROSEMIDE 40 MG PO TABS
40.0000 mg | ORAL_TABLET | Freq: Every day | ORAL | Status: DC
  Administered 2024-01-03: 40 mg via ORAL
  Filled 2024-01-03: qty 1

## 2024-01-03 MED ORDER — ASPIRIN 81 MG PO TBEC
81.0000 mg | DELAYED_RELEASE_TABLET | Freq: Every day | ORAL | Status: DC
  Administered 2024-01-03: 81 mg via ORAL
  Filled 2024-01-03: qty 1

## 2024-01-03 MED ORDER — ROSUVASTATIN CALCIUM 5 MG PO TABS
10.0000 mg | ORAL_TABLET | Freq: Every day | ORAL | Status: DC
  Administered 2024-01-03: 10 mg via ORAL
  Filled 2024-01-03: qty 2

## 2024-01-03 MED ORDER — ISOSORBIDE MONONITRATE ER 60 MG PO TB24
60.0000 mg | ORAL_TABLET | Freq: Every day | ORAL | Status: DC
  Administered 2024-01-03: 60 mg via ORAL
  Filled 2024-01-03: qty 1

## 2024-01-03 NOTE — Progress Notes (Signed)
 CSW was alerted by pt's RN about impending discharge. CSW met with pt to ascertain if it was plan for her to return to [facility]. Pt explained that returning to [facility] was the plan. CSW attempted to call Lowella Bandy at [facility] and had to leave a voice mail.

## 2024-01-03 NOTE — TOC Transition Note (Signed)
 Transition of Care [facility]) - Discharge Note   Patient Details  Name: [name] MRN: [id number] Date of Birth: 06-05-1934  Transition of Care [facility] CM/SW Contact:  Patrice Paradise, LCSW Phone Number: 01/03/2024, 2:14 PM   Clinical Narrative:    Patient will DC to:?[facility] Anticipated DC date:?01/03/2024 Transport by: Sharin Mons   Per MD patient ready for DC to [facility]. RN, patient, patient's family, and facility notified of DC. Discharge Summary sent to facility. RN given number for report [phone] room 511. DC packet on chart. Ambulance transport requested for patient.   CSW signing off.   Judd Lien, [place] [phone]          Patient Goals and CMS Choice            Discharge Placement                       Discharge Plan and Services Additional resources added to the After Visit Summary for                                       Social Drivers of Health (SDOH) Interventions SDOH Screenings   Food Insecurity: No Food Insecurity (01/02/2024)  Housing: Low Risk  (01/02/2024)  Transportation Needs: No Transportation Needs (01/02/2024)  Utilities: Not At Risk (01/02/2024)  Depression (PHQ2-9): Low Risk  (11/11/2023)  Social Connections: Moderately Isolated (01/02/2024)  Tobacco Use: Low Risk  (01/01/2024)     Readmission Risk Interventions    12/09/2023   12:21 PM 11/23/2023   11:56 AM  Readmission Risk Prevention Plan  Transportation Screening Complete Complete  PCP or Specialist Appt within 5-7 Days  Complete  Home Care Screening  Complete  Medication Review (RN CM)  Complete  Skilled Nursing Facility Complete

## 2024-01-03 NOTE — Discharge Summary (Signed)
Physician Discharge Summary   Patient: [name] MRN: [id number] DOB: 09/12/1934  Admit date:     01/01/2024  Discharge date: 01/03/24  Discharge Physician: Baron Hamper    PCP: Lorenda Ishihara, MD      Discharge Diagnoses: Principal Problem:   Atrial fibrillation with RVR (HCC) Active Problems:   Acute on chronic heart failure with preserved ejection fraction (HCC)   Hypertension   History of breast cancer   CAD (coronary artery disease)   Hypokalemia   Elevated troponin  Resolved Problems:   * No resolved hospital problems. *  Hospital Course:  89 yo F evaluated for CAD, mildly elevated troponin, persistent afib and acute on chronic diastolic heart failure (all in the setting of anemia). Cardiology was consulted. Pt received PRBC transfusion and Hgb improved from 6.6 to 10.1. Cardiology did not have any plans for ischemic eval on this admission as they suspected the pt's anemia was the culprit for presentation. Pt not a candidate for aggressive cardiac interventions due to her other medical problems.  Cardio resumed the imdur 60 mg daily and continued lopressor 25 mg bid. Pt is not cleared for anti-coagulation. Cardiology increased the diltiazem from 240 mg to 300 mg daily. She will be discharged home with a new script for 300 mg daily diltiazem. Pt had a good response to lasix and was transitioned to her home oral lasix dose of 40 mg PO daily.   As the pt's Hgb is stable and she is feeling back to her baseline she will be discharged today. Cardiology has signed off 01/03/2024.        DISCHARGE MEDICATION: Allergies as of 01/03/2024       Reactions   Ramipril Itching        Medication List     STOP taking these medications    Pro-Stat Liqd       TAKE these medications    aspirin EC 81 MG tablet Take 81 mg by mouth daily.   Belsomra 10 MG Tabs Generic drug: Suvorexant Take 10 mg by mouth at bedtime.   CALTRATE 600+D PO Take 1 tablet by  mouth 2 (two) times daily.   cyanocobalamin 1000 MCG tablet Commonly known as: VITAMIN B12 Take 1,000 mcg by mouth daily.   diltiazem 300 MG 24 hr capsule Commonly known as: CARDIZEM CD Take 1 capsule (300 mg total) by mouth daily. Start taking on: January 04, 2024 What changed:  medication strength how much to take   furosemide 40 MG tablet Commonly known as: LASIX Take 1 tablet (40 mg total) by mouth daily.   hydroxychloroquine 200 MG tablet Commonly known as: PLAQUENIL Take 200 mg by mouth daily.   isosorbide mononitrate 60 MG 24 hr tablet Commonly known as: IMDUR Take 1 tablet (60 mg total) by mouth daily.   loperamide 2 MG capsule Commonly known as: IMODIUM Take 1 capsule (2 mg total) by mouth as needed for diarrhea or loose stools. What changed: how much to take   melatonin 3 MG Tabs tablet Take 1 tablet (3 mg total) by mouth at bedtime as needed.   metoprolol tartrate 25 MG tablet Commonly known as: LOPRESSOR Take 1 tablet (25 mg total) by mouth 2 (two) times daily.   Multivitamin Women 50+ Tabs Take 1 tablet by mouth daily with breakfast.   pantoprazole 20 MG tablet Commonly known as: PROTONIX Take 1 tablet (20 mg total) by mouth daily.   potassium chloride 20 MEQ packet Commonly known as: KLOR-CON Take  20 mEq by mouth daily.   Proctozone-HC 2.5 % rectal cream Generic drug: hydrocortisone PLACE 1 APPLICATION RECTALLY 2 TIMES A DAY AS NEEDED FOR HEMORRHOIDS. What changed: See the new instructions.   rosuvastatin 10 MG tablet Commonly known as: CRESTOR Take 10 mg by mouth at bedtime.   sodium bicarbonate 650 MG tablet Take 1 tablet (650 mg total) by mouth 2 (two) times daily.   Sonafine Apply 1 Application topically 2 (two) times daily as needed (Apply to areas of redness from radiation as needed. Call Dr. Colletta [place] nurse at [phone] if you have any questions).   sucralfate 1 GM/10ML suspension Commonly known as: CARAFATE Take 10 mLs (1 g  total) by mouth 4 (four) times daily -  with meals and at bedtime. What changed: when to take this   tiZANidine 4 MG tablet Commonly known as: ZANAFLEX Take 4 mg by mouth 2 (two) times daily as needed for muscle spasms.   triamcinolone cream 0.1 % Commonly known as: KENALOG Apply 1 application  topically as needed (for rashes- affected areas).   Vitamin D3 50 MCG (2000 UT) Tabs Take 2,000 Units by mouth in the morning and at bedtime.        Discharge Exam: Filed Weights   01/01/24 2308 01/03/24 0520  Weight: 54.6 kg 52.8 kg   Physical Exam HENT:     Head: Normocephalic.     Mouth/Throat:     Mouth: Mucous membranes are moist.  Cardiovascular:     Rate and Rhythm: Normal rate. Rhythm irregular.  Pulmonary:     Effort: Pulmonary effort is normal.  Abdominal:     Palpations: Abdomen is soft.  Musculoskeletal:        General: Normal range of motion.     Cervical back: Neck supple.  Skin:    General: Skin is warm.  Neurological:     Mental Status: She is alert. Mental status is at baseline.  Psychiatric:        Mood and Affect: Mood normal.      Condition at discharge: fair  The results of significant diagnostics from this hospitalization (including imaging, microbiology, ancillary and laboratory) are listed below for reference.   Imaging Studies: DG Chest 2 View Result Date: 01/01/2024 CLINICAL DATA:  Chest pressure and tachycardia EXAM: CHEST - 2 VIEW COMPARISON:  11/25/2023 FINDINGS: Cardiac shadow is stable. Aortic calcifications are noted. Small effusions are again seen bilaterally as well as basilar atelectasis. Mild increased vascular congestion is again noted. No bony abnormality is noted. IMPRESSION: Changes of CHF with small effusions bilaterally. Electronically Signed   By: Alcide Clever M.D.   On: 01/01/2024 23:49   ECHOCARDIOGRAM LIMITED Result Date: 12/24/2023    ECHOCARDIOGRAM LIMITED REPORT   Patient Name:   [name] Date of Exam: 12/24/2023  Medical Rec #:  [id number]        Height:       66.0 in Accession #:    [id number]       Weight:       115.0 lb Date of Birth:  08-10-1934       BSA:          1.581 m Patient Age:    89 years         BP:           158/69 mmHg Patient Gender: F                HR:  80 bpm. Exam Location:  Inpatient Procedure: Limited Echo, Color Doppler and Cardiac Doppler (Both Spectral and            Color Flow Doppler were utilized during procedure). Indications:    atrial fibrillation  History:        Patient has prior history of Echocardiogram examinations, most                 recent 11/25/2023. CHF, Cancer. Chronic kidney disease,                 Arrythmias:Atrial Fibrillation; Risk Factors:Hypertension.  Sonographer:    Delcie Roch RDCS Referring Phys: [id number] VISHAL R PATEL IMPRESSIONS  1. Left ventricular ejection fraction, by estimation, is 70 to 75%. The left ventricle has hyperdynamic function. There is moderate asymmetric left ventricular hypertrophy of the basal-septal segment.  2. The mitral valve is degenerative. Trivial mitral valve regurgitation. Moderate mitral annular calcification.  3. The aortic valve is tricuspid. There is mild calcification of the aortic valve. There is mild thickening of the aortic valve. Aortic valve regurgitation is moderate. Aortic regurgitation PHT measures 440 msec.  4. There is moderately elevated pulmonary artery systolic pressure.  5. The inferior vena cava is dilated in size with >50% respiratory variability, suggesting right atrial pressure of 8 mmHg. FINDINGS  Left Ventricle: Left ventricular ejection fraction, by estimation, is 70 to 75%. The left ventricle has hyperdynamic function. There is moderate asymmetric left ventricular hypertrophy of the basal-septal segment. Right Ventricle: There is moderately elevated pulmonary artery systolic pressure. The tricuspid regurgitant velocity is 3.43 m/s, and with an assumed right atrial pressure of 8 mmHg, the estimated  right ventricular systolic pressure is 55.1 mmHg. Mitral Valve: The mitral valve is degenerative in appearance. There is mild thickening of the mitral valve leaflet(s). There is mild calcification of the mitral valve leaflet(s). Moderate mitral annular calcification. Trivial mitral valve regurgitation. Tricuspid Valve: Tricuspid valve regurgitation is mild. Aortic Valve: The aortic valve is tricuspid. There is mild calcification of the aortic valve. There is mild thickening of the aortic valve. Aortic valve regurgitation is moderate. Aortic regurgitation PHT measures 440 msec. Pulmonic Valve: Pulmonic valve regurgitation is trivial. Venous: The inferior vena cava is dilated in size with greater than 50% respiratory variability, suggesting right atrial pressure of 8 mmHg. Additional Comments: Spectral Doppler performed. Color Doppler performed.  LEFT VENTRICLE PLAX 2D LVIDd:         3.20 cm LVIDs:         1.70 cm LV PW:         1.01 cm LV IVS:        1.57 cm  IVC IVC diam: 2.20 cm LEFT ATRIUM         Index LA diam:    4.40 cm 2.78 cm/m  AORTIC VALVE LVOT Vmax:   90.70 cm/s LVOT Vmean:  60.200 cm/s LVOT VTI:    0.194 m AI PHT:      440 msec TRICUSPID VALVE TR Peak grad:   47.1 mmHg TR Vmax:        343.00 cm/s  SHUNTS Systemic VTI: 0.19 m Chilton Si MD Electronically signed by Chilton Si MD Signature Date/Time: 12/24/2023/5:38:46 PM    Final    DG Chest 2 View Result Date: 12/23/2023 CLINICAL DATA:  Tachycardia. Recently diagnosed atrial fibrillation. Shortness of breath. EXAM: CHEST - 2 VIEW COMPARISON:  12/05/2023. Chest, abdomen and pelvis CT dated 10/16/2023 FINDINGS: Interval mildly enlarged cardiac silhouette. Increased prominence of  the pulmonary vasculature and interstitial markings with interval moderate-sized bilateral pleural effusions and bibasilar atelectasis. Stable severe T11 vertebral compression fracture with vertebral plana and acute kyphosis. IMPRESSION: 1. Interval changes of CHF with  mild cardiomegaly, interval moderate pulmonary edema and moderate-sized bilateral pleural effusions. 2. Bibasilar atelectasis. Electronically Signed   By: Beckie Salts M.D.   On: 12/23/2023 17:56   DG Chest Port 1 View Result Date: 12/05/2023 CLINICAL DATA:  Shortness of breath EXAM: PORTABLE CHEST 1 VIEW COMPARISON:  Chest radiograph dated 04/07/2013 FINDINGS: Low lung volumes with bronchovascular crowding. Bibasilar patchy opacities and dense left retrocardiac opacity. Blunting of the bilateral costophrenic angles. No pneumothorax. The heart size and mediastinal contours are within normal limits. No acute osseous abnormality. IMPRESSION: 1. Low lung volumes with bronchovascular crowding. Bibasilar patchy opacities and dense left retrocardiac opacity, which may represent atelectasis, aspiration, or pneumonia. 2. Blunting of the bilateral costophrenic angles, which may represent small pleural effusions. Electronically Signed   By: Agustin Cree M.D.   On: 12/05/2023 16:33    Microbiology: Results for orders placed or performed during the hospital encounter of 12/23/23  C Difficile Quick Screen w PCR reflex     Status: Abnormal   Collection Time: 12/25/23  8:59 AM   Specimen: STOOL  Result Value Ref Range Status   C Diff antigen POSITIVE (A) NEGATIVE Final   C Diff toxin NEGATIVE NEGATIVE Final   C Diff interpretation Results are indeterminate. See PCR results.  Final    Comment: Performed at [facility], [address] [place], [place] [address]  C. Diff by PCR, Reflexed     Status: None   Collection Time: 12/25/23  8:59 AM  Result Value Ref Range Status   Toxigenic C. Difficile by PCR NEGATIVE NEGATIVE Final    Comment: Patient is colonized with non toxigenic C. difficile. May not need treatment unless significant symptoms are present. Performed at [facility], [address], [place], [place] [address]     Labs: CBC: Recent Labs  Lab 12/28/23 [phone] 12/28/23 1201  01/01/24 2356 01/02/24 0445 01/03/24 0241  WBC 5.8  --  6.9 9.6 7.9  NEUTROABS  --   --  5.8  --   --   HGB 7.4* 8.5* 6.6* 8.5* 10.1*  HCT 22.5* 26.1* 21.1* 27.4* 32.3*  MCV 102.3*  --  107.1* 105.8* 101.3*  PLT 121*  --  142* 210 152   Basic Metabolic Panel: Recent Labs  Lab 12/28/23 0306 12/29/23 0332 01/01/24 2356 01/02/24 0319 01/02/24 0445 01/03/24 0241  NA 139 140 142  --  143 141  K 3.3* 3.9 2.9*  --  5.0 3.3*  CL 107 110 114*  --  111 111  CO2 26 26 21*  --  22 20*  GLUCOSE 115* 122* 163*  --  110* 120*  BUN 28* 26* 27*  --  25* 23  CREATININE 1.16* 1.10* 1.06*  --  1.06* 0.93  CALCIUM 7.4* 7.5* 7.5*  --  8.2* 7.6*  MG  --   --   --  1.9 1.9 1.2*   Liver Function Tests: Recent Labs  Lab 01/01/24 2356 01/03/24 0241  AST 26 21  ALT 32 28  ALKPHOS 96 88  BILITOT 0.6 0.8  PROT 4.5* 4.7*  ALBUMIN 2.0* 2.0*   CBG: No results for input(s): "GLUCAP" in the last 168 hours.  Discharge time spent: greater than 30 minutes.  Signed: Baron Hamper , MD [facility]  01/03/2024 

## 2024-01-03 NOTE — Progress Notes (Signed)
 Attempted to call Bertram Denver RN for report. She declined report and asked to call after 3pm.

## 2024-01-03 NOTE — Plan of Care (Signed)
  Problem: Clinical Measurements: Goal: Will remain free from infection Outcome: Progressing Goal: Respiratory complications will improve Outcome: Progressing Goal: Cardiovascular complication will be avoided Outcome: Progressing   Problem: Elimination: Goal: Will not experience complications related to bowel motility Outcome: Progressing Goal: Will not experience complications related to urinary retention Outcome: Progressing   Problem: Safety: Goal: Ability to remain free from injury will improve Outcome: Progressing

## 2024-01-03 NOTE — Progress Notes (Signed)
 Progress Note  Patient Name: [name] Date of Encounter: 01/03/2024 Primary Cardiologist: Donato Schultz, MD   Subjective   Overnight weight has decreased 2 kg. No CP, SOB, Palpitations. Feels back to baseline   Vital Signs    Vitals:   01/02/24 1941 01/02/24 2224 01/03/24 0043 01/03/24 0520  BP: (!) 152/67 (!) 157/74 (!) 163/67 136/62  Pulse: 64 83 76 72  Resp: 18  15 17   Temp: (!) 97.5 F (36.4 C)  97.7 F (36.5 C) 97.8 F (36.6 C)  TempSrc: Oral  Oral Oral  SpO2: 98%  96% 97%  Weight:    52.8 kg  Height:        Intake/Output Summary (Last 24 hours) at 01/03/2024 0750 Last data filed at 01/03/2024 1610 Gross per 24 hour  Intake 491.04 ml  Output 1200 ml  Net -708.96 ml   Filed Weights   01/01/24 2308 01/03/24 0520  Weight: 54.6 kg 52.8 kg    Physical Exam   GEN: No acute distress.  Frail  Neck: No JVD Cardiac: IRIR pulses  Respiratory: Weaned to room air, no tachypnea or increased WOB GI: Soft, nontender, non-distended  MS: No edema  Labs   Telemetry: AF -rate controlled   Chemistry Recent Labs  Lab 01/01/24 2356 01/02/24 0445 01/03/24 0241  NA 142 143 141  K 2.9* 5.0 3.3*  CL 114* 111 111  CO2 21* 22 20*  GLUCOSE 163* 110* 120*  BUN 27* 25* 23  CREATININE 1.06* 1.06* 0.93  CALCIUM 7.5* 8.2* 7.6*  PROT 4.5*  --  4.7*  ALBUMIN 2.0*  --  2.0*  AST 26  --  21  ALT 32  --  28  ALKPHOS 96  --  88  BILITOT 0.6  --  0.8  GFRNONAA 51* 51* 59*  ANIONGAP 7 10 10      Hematology Recent Labs  Lab 01/01/24 2356 01/02/24 0445 01/03/24 0241  WBC 6.9 9.6 7.9  RBC 1.97* 2.59* 3.19*  HGB 6.6* 8.5* 10.1*  HCT 21.1* 27.4* 32.3*  MCV 107.1* 105.8* 101.3*  MCH 33.5 32.8 31.7  MCHC 31.3 31.0 31.3  RDW 16.1* 16.1* 18.6*  PLT 142* 210 152    Cardiac EnzymesNo results for input(s): "TROPONINI" in the last 168 hours. No results for input(s): "TROPIPOC" in the last 168 hours.   BNP Recent Labs  Lab 01/01/24 2356  BNP 2,416.8*      DDimer  Recent Labs  Lab 01/02/24 0445  DDIMER 3.21*     Cardiac Studies   Cardiac Studies & Procedures   ______________________________________________________________________________________________   STRESS TESTS  MYOCARDIAL PERFUSION IMAGING 01/24/2020  Narrative  The left ventricular ejection fraction is hyperdynamic (>65%).  Nuclear stress EF: 74%.  There was no ST segment deviation noted during stress.  This is a low risk study.  Small fixed anterior wall defect in anteroapex and mid anterior wall at stress and rest. With hyperdynamic EF and normal wall motion, this may be secondary to breast attenuation artifact, however cannot exclude small area of prior infarct. No ischemia noted , as rest perfusion defects are great than stress. Overall low risk study.   ECHOCARDIOGRAM  ECHOCARDIOGRAM LIMITED 12/24/2023  Narrative ECHOCARDIOGRAM LIMITED REPORT    Patient Name:   [name] Date of Exam: 12/24/2023 Medical Rec #:  [id number]        Height:       66.0 in Accession #:    [id number]  Weight:       115.0 lb Date of Birth:  1934/08/11       BSA:          1.581 m Patient Age:    89 years         BP:           158/69 mmHg Patient Gender: F                HR:           80 bpm. Exam Location:  Inpatient  Procedure: Limited Echo, Color Doppler and Cardiac Doppler (Both Spectral and Color Flow Doppler were utilized during procedure).  Indications:    atrial fibrillation  History:        Patient has prior history of Echocardiogram examinations, most recent 11/25/2023. CHF, Cancer. Chronic kidney disease, Arrythmias:Atrial Fibrillation; Risk Factors:Hypertension.  Sonographer:    Delcie Roch RDCS Referring Phys: [id number] VISHAL R PATEL  IMPRESSIONS   1. Left ventricular ejection fraction, by estimation, is 70 to 75%. The left ventricle has hyperdynamic function. There is moderate asymmetric left ventricular hypertrophy of the basal-septal  segment. 2. The mitral valve is degenerative. Trivial mitral valve regurgitation. Moderate mitral annular calcification. 3. The aortic valve is tricuspid. There is mild calcification of the aortic valve. There is mild thickening of the aortic valve. Aortic valve regurgitation is moderate. Aortic regurgitation PHT measures 440 msec. 4. There is moderately elevated pulmonary artery systolic pressure. 5. The inferior Alison cava is dilated in size with >50% respiratory variability, suggesting right atrial pressure of 8 mmHg.  FINDINGS Left Ventricle: Left ventricular ejection fraction, by estimation, is 70 to 75%. The left ventricle has hyperdynamic function. There is moderate asymmetric left ventricular hypertrophy of the basal-septal segment.  Right Ventricle: There is moderately elevated pulmonary artery systolic pressure. The tricuspid regurgitant velocity is 3.43 m/s, and with an assumed right atrial pressure of 8 mmHg, the estimated right ventricular systolic pressure is 55.1 mmHg.  Mitral Valve: The mitral valve is degenerative in appearance. There is mild thickening of the mitral valve leaflet(s). There is mild calcification of the mitral valve leaflet(s). Moderate mitral annular calcification. Trivial mitral valve regurgitation.  Tricuspid Valve: Tricuspid valve regurgitation is mild.  Aortic Valve: The aortic valve is tricuspid. There is mild calcification of the aortic valve. There is mild thickening of the aortic valve. Aortic valve regurgitation is moderate. Aortic regurgitation PHT measures 440 msec.  Pulmonic Valve: Pulmonic valve regurgitation is trivial.  Venous: The inferior Alison cava is dilated in size with greater than 50% respiratory variability, suggesting right atrial pressure of 8 mmHg.  Additional Comments: Spectral Doppler performed. Color Doppler performed.  LEFT VENTRICLE PLAX 2D LVIDd:         3.20 cm LVIDs:         1.70 cm LV PW:         1.01 cm LV IVS:         1.57 cm   IVC IVC diam: 2.20 cm  LEFT ATRIUM         Index LA diam:    4.40 cm 2.78 cm/m AORTIC VALVE LVOT Vmax:   90.70 cm/s LVOT Vmean:  60.200 cm/s LVOT VTI:    0.194 m AI PHT:      440 msec  TRICUSPID VALVE TR Peak grad:   47.1 mmHg TR Vmax:        343.00 cm/s  SHUNTS Systemic VTI: 0.19 m  Chilton Si MD Electronically signed  by Chilton Si MD Signature Date/Time: 12/24/2023/5:38:46 PM    Final          ______________________________________________________________________________________________      Assessment & Plan   CAD Mildly Elevated Troponin  -presented with a hemoglobin < 7, this with AF and underlying non obstructive CAD likely the culprit - No plans for ischemic evaluation at this time, patient is not a candidate for aggressive cardiac interventions (see 01/02/24 note) - restarted ASA; if concern from incoming primary team will hold until patient is cleared - restart imdur 60 mg daily, continue metoprolol tartate 25 mg BID  - restart crestor 10 mg daily  - Recent echocardiogram from 3/19 showed EF 70-75%. No need to repeat   Persistent atrial fibrillation  - not cleared to start [facility] - control improved with treatment of hypokalemia and increase of diltiazem dose - transition to 300 mg dose at home (home dose was 240)   Acute on chronic diastolic heart failure  Moderate AI  - good urine output on lasix - complicated by hypoalbuminemia - transition to home oral lasix (40 mg PO Daily)  Otherwise as per primary  - symptomatic macrocytic anemia - Metastatic breast cancer    Will arrange follow up and sign off   For questions or updates, please contact [facility] Please consult [email or web address] for contact info under Cardiology/STEMI.      Riley Lam, MD FASE [facility] Cardiologist [facility]  [address] [place], [address] [place], [place] [address] [phone]  7:50 AM

## 2024-01-05 DIAGNOSIS — D84821 Immunodeficiency due to drugs: Secondary | ICD-10-CM | POA: Diagnosis not present

## 2024-01-05 DIAGNOSIS — M6281 Muscle weakness (generalized): Secondary | ICD-10-CM | POA: Diagnosis not present

## 2024-01-05 DIAGNOSIS — I10 Essential (primary) hypertension: Secondary | ICD-10-CM | POA: Diagnosis not present

## 2024-01-05 DIAGNOSIS — E43 Unspecified severe protein-calorie malnutrition: Secondary | ICD-10-CM | POA: Diagnosis not present

## 2024-01-05 DIAGNOSIS — I5032 Chronic diastolic (congestive) heart failure: Secondary | ICD-10-CM | POA: Diagnosis not present

## 2024-01-05 DIAGNOSIS — I480 Paroxysmal atrial fibrillation: Secondary | ICD-10-CM | POA: Diagnosis not present

## 2024-01-05 DIAGNOSIS — R2689 Other abnormalities of gait and mobility: Secondary | ICD-10-CM | POA: Diagnosis not present

## 2024-01-05 DIAGNOSIS — C7981 Secondary malignant neoplasm of breast: Secondary | ICD-10-CM | POA: Diagnosis not present

## 2024-01-05 DIAGNOSIS — I5031 Acute diastolic (congestive) heart failure: Secondary | ICD-10-CM | POA: Diagnosis not present

## 2024-01-05 DIAGNOSIS — C50412 Malignant neoplasm of upper-outer quadrant of left female breast: Secondary | ICD-10-CM | POA: Diagnosis not present

## 2024-01-05 DIAGNOSIS — D849 Immunodeficiency, unspecified: Secondary | ICD-10-CM | POA: Diagnosis not present

## 2024-01-05 DIAGNOSIS — L89320 Pressure ulcer of left buttock, unstageable: Secondary | ICD-10-CM | POA: Diagnosis not present

## 2024-01-05 DIAGNOSIS — R2681 Unsteadiness on feet: Secondary | ICD-10-CM | POA: Diagnosis not present

## 2024-01-05 DIAGNOSIS — R1312 Dysphagia, oropharyngeal phase: Secondary | ICD-10-CM | POA: Diagnosis not present

## 2024-01-05 DIAGNOSIS — D649 Anemia, unspecified: Secondary | ICD-10-CM | POA: Diagnosis not present

## 2024-01-05 DIAGNOSIS — R627 Adult failure to thrive: Secondary | ICD-10-CM | POA: Diagnosis not present

## 2024-01-06 DIAGNOSIS — G4700 Insomnia, unspecified: Secondary | ICD-10-CM | POA: Diagnosis not present

## 2024-01-06 DIAGNOSIS — I5031 Acute diastolic (congestive) heart failure: Secondary | ICD-10-CM | POA: Diagnosis not present

## 2024-01-06 DIAGNOSIS — I480 Paroxysmal atrial fibrillation: Secondary | ICD-10-CM | POA: Diagnosis not present

## 2024-01-07 ENCOUNTER — Other Ambulatory Visit: Payer: Self-pay | Admitting: *Deleted

## 2024-01-07 NOTE — Patient Outreach (Signed)
 Post-Acute Care Manager follow up. Mrs. Rail resides in [facility].  Screening for potential complex care management services as benefit of health plan and primary care provider.  Collaboration meeting with [facility] psychotherapist. Wilkie Aye reports transition plan is for home. Care plan meeting scheduled for today.  Mrs. Traughber previously agreed to [facility] CCM services.   Will continue to follow.   Raiford Noble, MSN, RN, BSN [place]  [facility], [facility] RN Post- Acute Care Manager Direct Dial: [phone]

## 2024-01-08 ENCOUNTER — Inpatient Hospital Stay

## 2024-01-08 ENCOUNTER — Inpatient Hospital Stay (HOSPITAL_BASED_OUTPATIENT_CLINIC_OR_DEPARTMENT_OTHER): Admitting: Nurse Practitioner

## 2024-01-08 ENCOUNTER — Inpatient Hospital Stay: Attending: Oncology

## 2024-01-08 ENCOUNTER — Encounter: Payer: Self-pay | Admitting: Nurse Practitioner

## 2024-01-08 VITALS — BP 137/89 | HR 89 | Temp 98.2°F | Resp 18 | Ht 66.0 in | Wt 116.8 lb

## 2024-01-08 DIAGNOSIS — I480 Paroxysmal atrial fibrillation: Secondary | ICD-10-CM | POA: Diagnosis not present

## 2024-01-08 DIAGNOSIS — Z1501 Genetic susceptibility to malignant neoplasm of breast: Secondary | ICD-10-CM | POA: Diagnosis not present

## 2024-01-08 DIAGNOSIS — C7951 Secondary malignant neoplasm of bone: Secondary | ICD-10-CM

## 2024-01-08 DIAGNOSIS — Z170 Estrogen receptor positive status [ER+]: Secondary | ICD-10-CM | POA: Insufficient documentation

## 2024-01-08 DIAGNOSIS — C50919 Malignant neoplasm of unspecified site of unspecified female breast: Secondary | ICD-10-CM | POA: Diagnosis not present

## 2024-01-08 DIAGNOSIS — D75839 Thrombocytosis, unspecified: Secondary | ICD-10-CM

## 2024-01-08 DIAGNOSIS — C787 Secondary malignant neoplasm of liver and intrahepatic bile duct: Secondary | ICD-10-CM | POA: Diagnosis not present

## 2024-01-08 DIAGNOSIS — D84821 Immunodeficiency due to drugs: Secondary | ICD-10-CM | POA: Diagnosis not present

## 2024-01-08 DIAGNOSIS — C50412 Malignant neoplasm of upper-outer quadrant of left female breast: Secondary | ICD-10-CM | POA: Diagnosis not present

## 2024-01-08 DIAGNOSIS — Z5111 Encounter for antineoplastic chemotherapy: Secondary | ICD-10-CM | POA: Diagnosis not present

## 2024-01-08 DIAGNOSIS — E43 Unspecified severe protein-calorie malnutrition: Secondary | ICD-10-CM | POA: Diagnosis not present

## 2024-01-08 DIAGNOSIS — Z1509 Genetic susceptibility to other malignant neoplasm: Secondary | ICD-10-CM | POA: Insufficient documentation

## 2024-01-08 DIAGNOSIS — D849 Immunodeficiency, unspecified: Secondary | ICD-10-CM | POA: Diagnosis not present

## 2024-01-08 DIAGNOSIS — Z853 Personal history of malignant neoplasm of breast: Secondary | ICD-10-CM | POA: Diagnosis not present

## 2024-01-08 DIAGNOSIS — R2689 Other abnormalities of gait and mobility: Secondary | ICD-10-CM | POA: Diagnosis not present

## 2024-01-08 DIAGNOSIS — R1312 Dysphagia, oropharyngeal phase: Secondary | ICD-10-CM | POA: Diagnosis not present

## 2024-01-08 DIAGNOSIS — R2681 Unsteadiness on feet: Secondary | ICD-10-CM | POA: Diagnosis not present

## 2024-01-08 DIAGNOSIS — I5032 Chronic diastolic (congestive) heart failure: Secondary | ICD-10-CM | POA: Diagnosis not present

## 2024-01-08 DIAGNOSIS — M6281 Muscle weakness (generalized): Secondary | ICD-10-CM | POA: Diagnosis not present

## 2024-01-08 LAB — CBC WITH DIFFERENTIAL (CANCER CENTER ONLY)
Abs Immature Granulocytes: 0.13 10*3/uL — ABNORMAL HIGH (ref 0.00–0.07)
Basophils Absolute: 0.1 10*3/uL (ref 0.0–0.1)
Basophils Relative: 1 %
Eosinophils Absolute: 0.1 10*3/uL (ref 0.0–0.5)
Eosinophils Relative: 1 %
HCT: 33.4 % — ABNORMAL LOW (ref 36.0–46.0)
Hemoglobin: 10.3 g/dL — ABNORMAL LOW (ref 12.0–15.0)
Immature Granulocytes: 1 %
Lymphocytes Relative: 9 %
Lymphs Abs: 0.9 10*3/uL (ref 0.7–4.0)
MCH: 31.3 pg (ref 26.0–34.0)
MCHC: 30.8 g/dL (ref 30.0–36.0)
MCV: 101.5 fL — ABNORMAL HIGH (ref 80.0–100.0)
Monocytes Absolute: 0.5 10*3/uL (ref 0.1–1.0)
Monocytes Relative: 5 %
Neutro Abs: 8.1 10*3/uL — ABNORMAL HIGH (ref 1.7–7.7)
Neutrophils Relative %: 83 %
Platelet Count: 197 10*3/uL (ref 150–400)
RBC: 3.29 MIL/uL — ABNORMAL LOW (ref 3.87–5.11)
RDW: 16.2 % — ABNORMAL HIGH (ref 11.5–15.5)
WBC Count: 9.7 10*3/uL (ref 4.0–10.5)
nRBC: 0 % (ref 0.0–0.2)

## 2024-01-08 LAB — CMP (CANCER CENTER ONLY)
ALT: 34 U/L (ref 0–44)
AST: 28 U/L (ref 15–41)
Albumin: 2.7 g/dL — ABNORMAL LOW (ref 3.5–5.0)
Alkaline Phosphatase: 96 U/L (ref 38–126)
Anion gap: 7 (ref 5–15)
BUN: 21 mg/dL (ref 8–23)
CO2: 25 mmol/L (ref 22–32)
Calcium: 7.8 mg/dL — ABNORMAL LOW (ref 8.9–10.3)
Chloride: 110 mmol/L (ref 98–111)
Creatinine: 0.88 mg/dL (ref 0.44–1.00)
GFR, Estimated: >60 mL/min (ref >60)
Glucose, Bld: 130 mg/dL — ABNORMAL HIGH (ref 70–99)
Potassium: 3.7 mmol/L (ref 3.5–5.1)
Sodium: 142 mmol/L (ref 135–145)
Total Bilirubin: 0.7 mg/dL (ref 0.0–1.2)
Total Protein: 5.2 g/dL — ABNORMAL LOW (ref 6.5–8.1)

## 2024-01-08 MED ORDER — FULVESTRANT 250 MG/5ML IM SOSY
500.0000 mg | PREFILLED_SYRINGE | Freq: Once | INTRAMUSCULAR | Status: AC
  Administered 2024-01-08: 500 mg via INTRAMUSCULAR
  Filled 2024-01-08: qty 10

## 2024-01-08 NOTE — Progress Notes (Signed)
 [facility] OFFICE PROGRESS NOTE   Diagnosis: Metastatic breast cancer  INTERVAL HISTORY:   [name] returns for follow-up.  She denies pain.  No nausea or vomiting.  Stable chronic frequent loose stools.  No rash.  No hot flashes.  Objective:  Vital signs in last 24 hours:  Blood pressure 137/89, pulse 89, temperature 98.2 F (36.8 C), temperature source Temporal, resp. rate 18, height 5\' 6"  (1.676 m), weight 116 lb 12.8 oz (53 kg), last menstrual period 10/07/1974, SpO2 96%.    HEENT: No thrush. Resp: Decreased breath sounds right greater than left lower lung field.  No respiratory distress. Cardio: Irregular. GI: Abdomen soft and nontender.  No hepatosplenomegaly. Vascular: 1+ pitting lower leg edema bilaterally. Skin: No rash.   Lab Results:  Lab Results  Component Value Date   WBC 9.7 01/08/2024   HGB 10.3 (L) 01/08/2024   HCT 33.4 (L) 01/08/2024   MCV 101.5 (H) 01/08/2024   PLT 197 01/08/2024   NEUTROABS 8.1 (H) 01/08/2024    Imaging:  No results found.  Medications: I have reviewed the patient's current medications.  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  Status post a needle localized lumpectomy and sentinel lymph node biopsy 04/13/2009 with the pathology confirming high-grade ductal carcinoma in situ with an associated 0.26 mm invasive carcinoma, ER positive, PR positive, and HER2 positive. Status post adjuvant right breast radiation completed  06/19/2009. Initiation of Arimidex 05/01/2009.  Completed 5 years in July 2015 Family history of breast cancer. History of Clostridium difficile colitis. Frequent bowel movements following the bowel resection in 2009. 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 CTs 10/16/2023-innumerable lung nodules both lungs; multiple new ill-defined low-density liver lesions; bony metastatic disease in the thoracic and lumbar spine.  Severe compression fracture at T11 with osseous retropulsion.  Small right greater than left pleural effusions. Biopsy liver lesion 10/30/2023-metastatic moderate to poorly differentiated adenocarcinoma consistent with breast origin; HER2 negative (1+), ER +100%, PR +30%, Ki-67 60%, ESR 1 nutation positive, PIK3CA negative Faslodex/Abemaciclib 11/07/2023, Abemaciclib discontinued on hospital admission 11/21/2023 Faslodex 12/12/2023, 12/26/2023, 01/08/2024 Elevated platelet count-essential thrombocytosis 07/31/22- postitive [id number] 03/05/2023-hydroxyurea 500 mg Monday, Wednesday, and Friday Hydroxyurea discontinued March 2025 CHEK2 pathogenic mutation positive Severe back pain-MRI 10/09/2023 with compression fracture at T11, abnormal signal T9, T10, T11 and T12 as well as several adjacent ribs; right greater than left pleural effusion Palliative radiation to the lower thoracic, lumbar, and upper sacrum 11/18/2023-12/03/2023 12.  Admission 11/21/2023 with acute renal failure, elevated liver enzymes, dehydration, and severe  back pain Renal failure and elevated liver enzymes improved, discharged 12/04/2023 13.  Anemia/thrombocytopenia-likely secondary to Abemaciclib, metastatic breast cancer involving the bone marrow, and radiation 14.  Admission  12/05/2023 with chest pain and rapid atrial fibrillation 15.  Admission 12/23/2023 with rapid atrial fibrillation and CHF 16.  Anemia secondary to chronic disease, metastatic breast cancer involving the bone marrow, phlebotomy, and chemotherapy/radiation  Disposition: [name] has metastatic breast cancer.  She is being treated with Faslodex.  She has had clinical improvement with a significant decrease in pain.  Plan to proceed with Faslodex today as scheduled.  Further injections will be given on an every 4-week schedule.  She agrees with this plan.  CBC and chemistry panel reviewed.  Hemoglobin is better.  We will request a CBC be completed at the nursing facility in 2 weeks and forwarded to our office.  She will return for lab, follow-up, Faslodex in 4 weeks.  We are available to see her sooner if needed.    Lonna Cobb ANP/GNP-BC   01/08/2024  12:12 PM

## 2024-01-08 NOTE — Progress Notes (Signed)
 Patient seen by Lonna Cobb NP today  Vitals are within treatment parameters:Yes   Labs are within treatment parameters: Yes   Treatment plan has been signed: Yes   Per physician team, Patient is ready for treatment and there are NO modifications to the treatment plan.

## 2024-01-12 DIAGNOSIS — C50412 Malignant neoplasm of upper-outer quadrant of left female breast: Secondary | ICD-10-CM | POA: Diagnosis not present

## 2024-01-12 DIAGNOSIS — D84821 Immunodeficiency due to drugs: Secondary | ICD-10-CM | POA: Diagnosis not present

## 2024-01-12 DIAGNOSIS — E43 Unspecified severe protein-calorie malnutrition: Secondary | ICD-10-CM | POA: Diagnosis not present

## 2024-01-12 DIAGNOSIS — R1312 Dysphagia, oropharyngeal phase: Secondary | ICD-10-CM | POA: Diagnosis not present

## 2024-01-12 DIAGNOSIS — D849 Immunodeficiency, unspecified: Secondary | ICD-10-CM | POA: Diagnosis not present

## 2024-01-12 DIAGNOSIS — I5032 Chronic diastolic (congestive) heart failure: Secondary | ICD-10-CM | POA: Diagnosis not present

## 2024-01-12 DIAGNOSIS — L89320 Pressure ulcer of left buttock, unstageable: Secondary | ICD-10-CM | POA: Diagnosis not present

## 2024-01-12 DIAGNOSIS — M6281 Muscle weakness (generalized): Secondary | ICD-10-CM | POA: Diagnosis not present

## 2024-01-12 DIAGNOSIS — R2681 Unsteadiness on feet: Secondary | ICD-10-CM | POA: Diagnosis not present

## 2024-01-12 DIAGNOSIS — R2689 Other abnormalities of gait and mobility: Secondary | ICD-10-CM | POA: Diagnosis not present

## 2024-01-12 DIAGNOSIS — I480 Paroxysmal atrial fibrillation: Secondary | ICD-10-CM | POA: Diagnosis not present

## 2024-01-12 NOTE — Progress Notes (Signed)
 Cardiology Office Note:  .   Date:  01/20/2024  ID:  [name], DOB 04-26-1934, MRN [id number] PCP: Arva Lathe, MD  [facility] Providers Cardiologist:  Dorothye Gathers, MD    History of Present Illness: [name]   [name] is a 89 y.o. female  with a hx of breast cancer with METS to liver, peritoneum, spine, prior ovarian cancer, moderate CAD with chronic episodic chest tightness, HTN, hypertrophic cardiomyopathy, chronic HFpEF, paroxysmal atrial fibrillation, GERD, HLD, carotid artery disease.  patient had a remote cath in 2007 that showed moderate LAD disease with 50% narrowing after D2, 50% prox D2, 30-40% PDA, irregularities in mid RCA, elevated LVEDP with suspected diastolic dysfunction. She has reported episodic chest tightness at outpatient visits since that time. She had a stress test in 2021 that showed small fixed defect in anteroapex and mid anterior wall at stress and rest question breast attenuation artifact, cannot exclude small area of prior infarct, low risk study without ischemia, managed medically. Echocardiogram 03/2022 showed EF 65-70%, grade III DD, mild LVH of the basal septal segment, mildly elevated PASP, mild MR. When last seen by DR. Skains in 02/2023, he noted that she had a history of suspected microvascular disease. Also raised question of GERD/musculoskeletal cuases of chest discomfort.   Admitted 4x's this year with Afib with RVR. Echocardiogram 11/25/23 showed severe basal septal hypertrophy with maximal septal thickness 20 mm, EF 65-70%, normal RV systolic function. Cardiology was consulted and we recommended rate control strategy due to her complex advanced oncological disease. Most recent admission Afib in setting of Hgb <7 and mildly elevated troponins.   Patient comes in with her daughter. She's at [facility]. Trouble standing but able to walk. HR 111/m today. She says she get SOB and chest pain when she is doing therapy and HR goes up.  Legs are swelling, she is using salt at facility. Labs 01/08/24 reviewed Hgb 10.3. says she had labs drawn at the facility past 2 days.    ROS:    Studies Reviewed: [name]         Prior CV Studies:    MYOCARDIAL PERFUSION IMAGING 01/24/2020   Narrative  The left ventricular ejection fraction is hyperdynamic (>65%).  Nuclear stress EF: 74%.  There was no ST segment deviation noted during stress.  This is a low risk study.   Small fixed anterior wall defect in anteroapex and mid anterior wall at stress and rest. With hyperdynamic EF and normal wall motion, this may be secondary to breast attenuation artifact, however cannot exclude small area of prior infarct. No ischemia noted , as rest perfusion defects are great than stress. Overall low risk study.   ECHOCARDIOGRAM   ECHOCARDIOGRAM LIMITED 12/24/2023   Narrative ECHOCARDIOGRAM LIMITED REPORT       Patient Name:   [name] Date of Exam: 12/24/2023 Medical Rec #:  [id number]        Height:       66.0 in Accession #:    [id number]       Weight:       115.0 lb Date of Birth:  03-Mar-1934       BSA:          1.581 m Patient Age:    89 years         BP:           158/69 mmHg Patient Gender: F  HR:           80 bpm. Exam Location:  Inpatient   Procedure: Limited Echo, Color Doppler and Cardiac Doppler (Both Spectral and Color Flow Doppler were utilized during procedure).   Indications:    atrial fibrillation   History:        Patient has prior history of Echocardiogram examinations, most recent 11/25/2023. CHF, Cancer. Chronic kidney disease, Arrythmias:Atrial Fibrillation; Risk Factors:Hypertension.   Sonographer:    Dione Franks RDCS Referring Phys: [id number] VISHAL R PATEL   IMPRESSIONS     1. Left ventricular ejection fraction, by estimation, is 70 to 75%. The left ventricle has hyperdynamic function. There is moderate asymmetric left ventricular hypertrophy of the basal-septal segment. 2. The  mitral valve is degenerative. Trivial mitral valve regurgitation. Moderate mitral annular calcification. 3. The aortic valve is tricuspid. There is mild calcification of the aortic valve. There is mild thickening of the aortic valve. Aortic valve regurgitation is moderate. Aortic regurgitation PHT measures 440 msec. 4. There is moderately elevated pulmonary artery systolic pressure. 5. The inferior vena cava is dilated in size with >50% respiratory variability, suggesting right atrial pressure of 8 mmHg.    Risk Assessment/Calculations:    CHA2DS2-VASc Score = 6   This indicates a 9.7% annual risk of stroke. The patient's score is based upon: CHF History: 1 (probable chronic HFpEF per notes) HTN History: 1 Diabetes History: 0 Stroke History: 0 Vascular Disease History: 1 Age Score: 2 Gender Score: 1        Physical Exam:   VS:  BP (!) 144/72   Pulse (!) 111   Ht 5\' 6"  (1.676 m)   Wt 116 lb (52.6 kg)   LMP 10/07/1974   SpO2 98%   BMI 18.72 kg/m    Wt Readings from Last 3 Encounters:  01/20/24 116 lb (52.6 kg)  01/08/24 116 lb 12.8 oz (53 kg)  01/03/24 116 lb 6.5 oz (52.8 kg)    GEN: thin, elderly, in no acute distress NECK: No JVD; No carotid bruits CARDIAC: irreg 111/m, no murmurs, rubs, gallops RESPIRATORY:  Clear to auscultation without rales, wheezing or rhonchi  ABDOMEN: Soft, non-tender, non-distended EXTREMITIES:  2-3 plus edema; No deformity   ASSESSMENT AND PLAN: .    CAD Mildly Elevated Troponin  -presented with a hemoglobin < 7, this with AF and underlying non obstructive CAD likely the culprit - No plans for ischemic evaluation at this time, patient is not a candidate for aggressive cardiac intervention-  -on ASA - imdur 60 mg daily, increase metoprolol tartate 50 mg BID  -  crestor 10 mg daily  - Recent echocardiogram from 3/19 showed EF 70-75%.   Persistent atrial fibrillation  - not cleared to start AC - HR 111/m at rest.  - diltiazem 300 mg  daily -increase metoprolol 50 mg bid -PT & OT parameters given -patient prefers to try rate control at home vs going back to the hospital   Acute on chronic diastolic heart failure  Moderate AI  - rapid Afib and high salt diet likely contributing -increase lasix 40 mg bid for 3 days then back to 40 mg daily, increase Kdur 20 meq bid x 3 days then 20 meq daily  -2 gm sodium diet discussed, if no improvement may need hospitalization. Daughter aware.          Dispo: f/u in 3 weeks  Signed, Theotis Flake, PA-C

## 2024-01-13 DIAGNOSIS — I480 Paroxysmal atrial fibrillation: Secondary | ICD-10-CM | POA: Diagnosis not present

## 2024-01-13 DIAGNOSIS — I5031 Acute diastolic (congestive) heart failure: Secondary | ICD-10-CM | POA: Diagnosis not present

## 2024-01-13 DIAGNOSIS — I10 Essential (primary) hypertension: Secondary | ICD-10-CM | POA: Diagnosis not present

## 2024-01-13 DIAGNOSIS — Z515 Encounter for palliative care: Secondary | ICD-10-CM | POA: Diagnosis not present

## 2024-01-14 DIAGNOSIS — I129 Hypertensive chronic kidney disease with stage 1 through stage 4 chronic kidney disease, or unspecified chronic kidney disease: Secondary | ICD-10-CM | POA: Diagnosis not present

## 2024-01-14 DIAGNOSIS — I480 Paroxysmal atrial fibrillation: Secondary | ICD-10-CM | POA: Diagnosis not present

## 2024-01-14 DIAGNOSIS — I5031 Acute diastolic (congestive) heart failure: Secondary | ICD-10-CM | POA: Diagnosis not present

## 2024-01-14 DIAGNOSIS — I10 Essential (primary) hypertension: Secondary | ICD-10-CM | POA: Diagnosis not present

## 2024-01-15 DIAGNOSIS — E43 Unspecified severe protein-calorie malnutrition: Secondary | ICD-10-CM | POA: Diagnosis not present

## 2024-01-15 DIAGNOSIS — R2681 Unsteadiness on feet: Secondary | ICD-10-CM | POA: Diagnosis not present

## 2024-01-15 DIAGNOSIS — C50412 Malignant neoplasm of upper-outer quadrant of left female breast: Secondary | ICD-10-CM | POA: Diagnosis not present

## 2024-01-15 DIAGNOSIS — R2689 Other abnormalities of gait and mobility: Secondary | ICD-10-CM | POA: Diagnosis not present

## 2024-01-15 DIAGNOSIS — D84821 Immunodeficiency due to drugs: Secondary | ICD-10-CM | POA: Diagnosis not present

## 2024-01-15 DIAGNOSIS — D849 Immunodeficiency, unspecified: Secondary | ICD-10-CM | POA: Diagnosis not present

## 2024-01-15 DIAGNOSIS — I480 Paroxysmal atrial fibrillation: Secondary | ICD-10-CM | POA: Diagnosis not present

## 2024-01-15 DIAGNOSIS — M6281 Muscle weakness (generalized): Secondary | ICD-10-CM | POA: Diagnosis not present

## 2024-01-15 DIAGNOSIS — R1312 Dysphagia, oropharyngeal phase: Secondary | ICD-10-CM | POA: Diagnosis not present

## 2024-01-15 DIAGNOSIS — I5032 Chronic diastolic (congestive) heart failure: Secondary | ICD-10-CM | POA: Diagnosis not present

## 2024-01-16 DIAGNOSIS — G4700 Insomnia, unspecified: Secondary | ICD-10-CM | POA: Diagnosis not present

## 2024-01-16 DIAGNOSIS — R21 Rash and other nonspecific skin eruption: Secondary | ICD-10-CM | POA: Diagnosis not present

## 2024-01-19 DIAGNOSIS — E43 Unspecified severe protein-calorie malnutrition: Secondary | ICD-10-CM | POA: Diagnosis not present

## 2024-01-19 DIAGNOSIS — C50412 Malignant neoplasm of upper-outer quadrant of left female breast: Secondary | ICD-10-CM | POA: Diagnosis not present

## 2024-01-19 DIAGNOSIS — M6281 Muscle weakness (generalized): Secondary | ICD-10-CM | POA: Diagnosis not present

## 2024-01-19 DIAGNOSIS — I480 Paroxysmal atrial fibrillation: Secondary | ICD-10-CM | POA: Diagnosis not present

## 2024-01-19 DIAGNOSIS — R1312 Dysphagia, oropharyngeal phase: Secondary | ICD-10-CM | POA: Diagnosis not present

## 2024-01-19 DIAGNOSIS — I5032 Chronic diastolic (congestive) heart failure: Secondary | ICD-10-CM | POA: Diagnosis not present

## 2024-01-19 DIAGNOSIS — D849 Immunodeficiency, unspecified: Secondary | ICD-10-CM | POA: Diagnosis not present

## 2024-01-19 DIAGNOSIS — R2681 Unsteadiness on feet: Secondary | ICD-10-CM | POA: Diagnosis not present

## 2024-01-19 DIAGNOSIS — D84821 Immunodeficiency due to drugs: Secondary | ICD-10-CM | POA: Diagnosis not present

## 2024-01-19 DIAGNOSIS — R2689 Other abnormalities of gait and mobility: Secondary | ICD-10-CM | POA: Diagnosis not present

## 2024-01-20 ENCOUNTER — Ambulatory Visit: Attending: Physician Assistant | Admitting: Physician Assistant

## 2024-01-20 ENCOUNTER — Encounter: Payer: Self-pay | Admitting: Physician Assistant

## 2024-01-20 VITALS — BP 144/72 | HR 111 | Ht 66.0 in | Wt 116.0 lb

## 2024-01-20 DIAGNOSIS — I2510 Atherosclerotic heart disease of native coronary artery without angina pectoris: Secondary | ICD-10-CM | POA: Diagnosis not present

## 2024-01-20 DIAGNOSIS — I4811 Longstanding persistent atrial fibrillation: Secondary | ICD-10-CM

## 2024-01-20 DIAGNOSIS — I129 Hypertensive chronic kidney disease with stage 1 through stage 4 chronic kidney disease, or unspecified chronic kidney disease: Secondary | ICD-10-CM | POA: Diagnosis not present

## 2024-01-20 DIAGNOSIS — I5033 Acute on chronic diastolic (congestive) heart failure: Secondary | ICD-10-CM | POA: Diagnosis not present

## 2024-01-20 DIAGNOSIS — I480 Paroxysmal atrial fibrillation: Secondary | ICD-10-CM | POA: Diagnosis not present

## 2024-01-20 DIAGNOSIS — I10 Essential (primary) hypertension: Secondary | ICD-10-CM | POA: Diagnosis not present

## 2024-01-20 DIAGNOSIS — I5031 Acute diastolic (congestive) heart failure: Secondary | ICD-10-CM | POA: Diagnosis not present

## 2024-01-20 MED ORDER — METOPROLOL TARTRATE 50 MG PO TABS: 50.0000 mg | ORAL_TABLET | Freq: Two times a day (BID) | ORAL | 3 refills | Status: DC

## 2024-01-20 NOTE — Patient Instructions (Addendum)
 Medication Instructions:  Your physician has recommended you make the following change in your medication:  INCREASE METOPROLOL TO 50 MG TWICE DAILY INCREASE LASIX TO 40 MG TWICE DAILY FOR 3 DAYS, THEN BACK TO 40 MG DAILY  INCREASE POTASSIUM TO  20 MG TWICE DAILY FOR 3 DAYS, THEN BACK TO 20 MG DAILY. *If you need a refill on your cardiac medications before your next appointment, please call your pharmacy*  Lab Work: NONE If you have labs (blood work) drawn today and your tests are completely normal, you will receive your results only by: MyChart Message (if you have MyChart) OR A paper copy in the mail If you have any lab test that is abnormal or we need to change your treatment, we will call you to review the results.  Testing/Procedures: NONE  Follow-Up: At [facility], you and your health needs are our priority.  As part of our continuing mission to provide you with exceptional heart care, our providers are all part of one team.  This team includes your primary Cardiologist (physician) and Advanced Practice Providers or APPs (Physician Assistants and Nurse Practitioners) who all work together to provide you with the care you need, when you need it.  Your next appointment:   3-4 week(s)  Provider:   Dorothye Gathers, MD or Theotis Flake, PA-C    We recommend signing up for the patient portal called "MyChart".  Sign up information is provided on this After Visit Summary.  MyChart is used to connect with patients for Virtual Visits (Telemedicine).  Patients are able to view lab/test results, encounter notes, upcoming appointments, etc.  Non-urgent messages can be sent to your provider as well.   To learn more about what you can do with MyChart, go to [email or web address].   Other Instructions       1st Floor: - Lobby - Registration  - Pharmacy  - Lab - Cafe  2nd Floor: - PV Lab - Diagnostic Testing (echo, CT, nuclear med)  3rd Floor: - Vacant  4th Floor: -  TCTS (cardiothoracic surgery) - AFib Clinic - Structural Heart Clinic - Vascular Surgery  - Vascular Ultrasound  5th Floor: - HeartCare Cardiology (general and EP) - Clinical Pharmacy for coumadin, hypertension, lipid, weight-loss medications, and med management appointments    Valet parking services will be available as well.

## 2024-01-22 ENCOUNTER — Telehealth: Payer: Self-pay | Admitting: *Deleted

## 2024-01-22 DIAGNOSIS — R1312 Dysphagia, oropharyngeal phase: Secondary | ICD-10-CM | POA: Diagnosis not present

## 2024-01-22 DIAGNOSIS — R2689 Other abnormalities of gait and mobility: Secondary | ICD-10-CM | POA: Diagnosis not present

## 2024-01-22 DIAGNOSIS — I480 Paroxysmal atrial fibrillation: Secondary | ICD-10-CM | POA: Diagnosis not present

## 2024-01-22 DIAGNOSIS — D849 Immunodeficiency, unspecified: Secondary | ICD-10-CM | POA: Diagnosis not present

## 2024-01-22 DIAGNOSIS — R2681 Unsteadiness on feet: Secondary | ICD-10-CM | POA: Diagnosis not present

## 2024-01-22 DIAGNOSIS — E43 Unspecified severe protein-calorie malnutrition: Secondary | ICD-10-CM | POA: Diagnosis not present

## 2024-01-22 DIAGNOSIS — M6281 Muscle weakness (generalized): Secondary | ICD-10-CM | POA: Diagnosis not present

## 2024-01-22 DIAGNOSIS — C50412 Malignant neoplasm of upper-outer quadrant of left female breast: Secondary | ICD-10-CM | POA: Diagnosis not present

## 2024-01-22 DIAGNOSIS — D84821 Immunodeficiency due to drugs: Secondary | ICD-10-CM | POA: Diagnosis not present

## 2024-01-22 DIAGNOSIS — I5032 Chronic diastolic (congestive) heart failure: Secondary | ICD-10-CM | POA: Diagnosis not present

## 2024-01-22 NOTE — Telephone Encounter (Addendum)
 Daughter, Orelia Binet called to report that Natacia has A. Fib and now diastolic heart failure w/edema, tachycardia and SOB. Saw cardiology on 4/15. Asking if Faslodex has caused this? Informed that this RN is not aware of Faslodex causing heart failure and the A. Fib predates her Faslodex. Will obtain Dr. Enedina Harrow opinion. Per Dr. Scherrie Curt: Faslodex does not cause heart failure. In rare cases can cause some edema. Daughter notified

## 2024-01-26 DIAGNOSIS — I480 Paroxysmal atrial fibrillation: Secondary | ICD-10-CM | POA: Diagnosis not present

## 2024-01-26 DIAGNOSIS — M6281 Muscle weakness (generalized): Secondary | ICD-10-CM | POA: Diagnosis not present

## 2024-01-26 DIAGNOSIS — C50412 Malignant neoplasm of upper-outer quadrant of left female breast: Secondary | ICD-10-CM | POA: Diagnosis not present

## 2024-01-26 DIAGNOSIS — I5032 Chronic diastolic (congestive) heart failure: Secondary | ICD-10-CM | POA: Diagnosis not present

## 2024-01-26 DIAGNOSIS — E43 Unspecified severe protein-calorie malnutrition: Secondary | ICD-10-CM | POA: Diagnosis not present

## 2024-01-26 DIAGNOSIS — R2689 Other abnormalities of gait and mobility: Secondary | ICD-10-CM | POA: Diagnosis not present

## 2024-01-26 DIAGNOSIS — D84821 Immunodeficiency due to drugs: Secondary | ICD-10-CM | POA: Diagnosis not present

## 2024-01-26 DIAGNOSIS — Z515 Encounter for palliative care: Secondary | ICD-10-CM | POA: Diagnosis not present

## 2024-01-26 DIAGNOSIS — D849 Immunodeficiency, unspecified: Secondary | ICD-10-CM | POA: Diagnosis not present

## 2024-01-26 DIAGNOSIS — R1312 Dysphagia, oropharyngeal phase: Secondary | ICD-10-CM | POA: Diagnosis not present

## 2024-01-26 DIAGNOSIS — L89320 Pressure ulcer of left buttock, unstageable: Secondary | ICD-10-CM | POA: Diagnosis not present

## 2024-01-26 DIAGNOSIS — R2681 Unsteadiness on feet: Secondary | ICD-10-CM | POA: Diagnosis not present

## 2024-01-27 ENCOUNTER — Telehealth: Payer: Self-pay | Admitting: Cardiology

## 2024-01-27 ENCOUNTER — Other Ambulatory Visit: Payer: Self-pay | Admitting: *Deleted

## 2024-01-27 DIAGNOSIS — Z79899 Other long term (current) drug therapy: Secondary | ICD-10-CM

## 2024-01-27 MED ORDER — POTASSIUM CHLORIDE 20 MEQ PO PACK: 20.0000 meq | PACK | Freq: Two times a day (BID) | ORAL | 1 refills | Status: DC

## 2024-01-27 MED ORDER — FUROSEMIDE 40 MG PO TABS: 40.0000 mg | ORAL_TABLET | Freq: Two times a day (BID) | ORAL | Status: DC

## 2024-01-27 MED ORDER — FUROSEMIDE 40 MG PO TABS: 40.0000 mg | ORAL_TABLET | Freq: Two times a day (BID) | ORAL | 2 refills | Status: DC

## 2024-01-27 NOTE — Telephone Encounter (Signed)
 Spoke with daughter per DPR and she is aware to increase lasix  to 40 mg BID and potassium 20 meq BID. BMET in 1 week.    Spoke with [place] and she took new orders over the phone. She states she will fax labs next week. Once labs results are back. She was given fax number.

## 2024-01-27 NOTE — Telephone Encounter (Signed)
 Spoke with daughter per DPR and she states since last OV 4/15 patient still has swelling in her legs and ankles. She was informed to let provider know if she still has swelling after increasing lasix  for 3 days.

## 2024-01-27 NOTE — Telephone Encounter (Signed)
 Pt c/o swelling/edema: STAT if pt has developed SOB within 24 hours  If swelling, where is the swelling located?   Both feet and lower legs  How much weight have you gained and in what time span?  Yes  Have you gained 2 pounds in a day or 5 pounds in a week?   5 lbs in a week   4/9 - 122, 4/17 - 127  Do you have a log of your daily weights (if so, list)?   Yes  Are you currently taking a fluid pill?   Yes  Are you currently SOB?  Not today  Have you traveled recently in a car or plane for an extended period of time?   Daughter stated patient is still having swelling in her lower legs and feet.  Daughter noted patient's HR has also been averaging over 100.

## 2024-01-30 DIAGNOSIS — I129 Hypertensive chronic kidney disease with stage 1 through stage 4 chronic kidney disease, or unspecified chronic kidney disease: Secondary | ICD-10-CM | POA: Diagnosis not present

## 2024-01-30 DIAGNOSIS — M069 Rheumatoid arthritis, unspecified: Secondary | ICD-10-CM | POA: Diagnosis not present

## 2024-01-30 DIAGNOSIS — D649 Anemia, unspecified: Secondary | ICD-10-CM | POA: Diagnosis not present

## 2024-01-30 DIAGNOSIS — I4891 Unspecified atrial fibrillation: Secondary | ICD-10-CM | POA: Diagnosis not present

## 2024-01-30 DIAGNOSIS — R197 Diarrhea, unspecified: Secondary | ICD-10-CM | POA: Diagnosis not present

## 2024-01-30 DIAGNOSIS — G4700 Insomnia, unspecified: Secondary | ICD-10-CM | POA: Diagnosis not present

## 2024-01-30 DIAGNOSIS — C7981 Secondary malignant neoplasm of breast: Secondary | ICD-10-CM | POA: Diagnosis not present

## 2024-01-30 DIAGNOSIS — I5031 Acute diastolic (congestive) heart failure: Secondary | ICD-10-CM | POA: Diagnosis not present

## 2024-02-02 DIAGNOSIS — L89323 Pressure ulcer of left buttock, stage 3: Secondary | ICD-10-CM | POA: Diagnosis not present

## 2024-02-03 DIAGNOSIS — I509 Heart failure, unspecified: Secondary | ICD-10-CM | POA: Diagnosis not present

## 2024-02-06 ENCOUNTER — Ambulatory Visit

## 2024-02-06 ENCOUNTER — Ambulatory Visit: Admitting: Oncology

## 2024-02-06 ENCOUNTER — Other Ambulatory Visit

## 2024-02-07 DIAGNOSIS — I422 Other hypertrophic cardiomyopathy: Secondary | ICD-10-CM | POA: Diagnosis not present

## 2024-02-07 DIAGNOSIS — R1310 Dysphagia, unspecified: Secondary | ICD-10-CM | POA: Diagnosis not present

## 2024-02-07 DIAGNOSIS — I350 Nonrheumatic aortic (valve) stenosis: Secondary | ICD-10-CM | POA: Diagnosis not present

## 2024-02-07 DIAGNOSIS — M19011 Primary osteoarthritis, right shoulder: Secondary | ICD-10-CM | POA: Diagnosis not present

## 2024-02-07 DIAGNOSIS — I10 Essential (primary) hypertension: Secondary | ICD-10-CM | POA: Diagnosis not present

## 2024-02-07 DIAGNOSIS — Z6820 Body mass index (BMI) 20.0-20.9, adult: Secondary | ICD-10-CM | POA: Diagnosis not present

## 2024-02-07 DIAGNOSIS — R32 Unspecified urinary incontinence: Secondary | ICD-10-CM | POA: Diagnosis not present

## 2024-02-07 DIAGNOSIS — Z85828 Personal history of other malignant neoplasm of skin: Secondary | ICD-10-CM | POA: Diagnosis not present

## 2024-02-07 DIAGNOSIS — D696 Thrombocytopenia, unspecified: Secondary | ICD-10-CM | POA: Diagnosis not present

## 2024-02-07 DIAGNOSIS — Z556 Problems related to health literacy: Secondary | ICD-10-CM | POA: Diagnosis not present

## 2024-02-07 DIAGNOSIS — I5033 Acute on chronic diastolic (congestive) heart failure: Secondary | ICD-10-CM | POA: Diagnosis not present

## 2024-02-07 DIAGNOSIS — C50919 Malignant neoplasm of unspecified site of unspecified female breast: Secondary | ICD-10-CM | POA: Diagnosis not present

## 2024-02-07 DIAGNOSIS — M19012 Primary osteoarthritis, left shoulder: Secondary | ICD-10-CM | POA: Diagnosis not present

## 2024-02-07 DIAGNOSIS — I2510 Atherosclerotic heart disease of native coronary artery without angina pectoris: Secondary | ICD-10-CM | POA: Diagnosis not present

## 2024-02-07 DIAGNOSIS — Z7982 Long term (current) use of aspirin: Secondary | ICD-10-CM | POA: Diagnosis not present

## 2024-02-07 DIAGNOSIS — E785 Hyperlipidemia, unspecified: Secondary | ICD-10-CM | POA: Diagnosis not present

## 2024-02-07 DIAGNOSIS — D631 Anemia in chronic kidney disease: Secondary | ICD-10-CM | POA: Diagnosis not present

## 2024-02-07 DIAGNOSIS — K219 Gastro-esophageal reflux disease without esophagitis: Secondary | ICD-10-CM | POA: Diagnosis not present

## 2024-02-07 DIAGNOSIS — I130 Hypertensive heart and chronic kidney disease with heart failure and stage 1 through stage 4 chronic kidney disease, or unspecified chronic kidney disease: Secondary | ICD-10-CM | POA: Diagnosis not present

## 2024-02-07 DIAGNOSIS — R569 Unspecified convulsions: Secondary | ICD-10-CM | POA: Diagnosis not present

## 2024-02-07 DIAGNOSIS — M549 Dorsalgia, unspecified: Secondary | ICD-10-CM | POA: Diagnosis not present

## 2024-02-07 DIAGNOSIS — N1832 Chronic kidney disease, stage 3b: Secondary | ICD-10-CM | POA: Diagnosis not present

## 2024-02-07 DIAGNOSIS — C7951 Secondary malignant neoplasm of bone: Secondary | ICD-10-CM | POA: Diagnosis not present

## 2024-02-07 DIAGNOSIS — E43 Unspecified severe protein-calorie malnutrition: Secondary | ICD-10-CM | POA: Diagnosis not present

## 2024-02-07 DIAGNOSIS — I4811 Longstanding persistent atrial fibrillation: Secondary | ICD-10-CM | POA: Diagnosis not present

## 2024-02-09 ENCOUNTER — Telehealth: Payer: Self-pay | Admitting: Cardiology

## 2024-02-09 NOTE — Telephone Encounter (Signed)
 Left a message for the pt/ daughter to call back.

## 2024-02-09 NOTE — Telephone Encounter (Signed)
 I spoke with the pts daughter and she says that the pt saw [name] 01/20/24 and her changes:   Acute on chronic diastolic heart failure  Moderate AI  - rapid Afib and high salt diet likely contributing -increase lasix  40 mg bid for 3 days then back to 40 mg daily, increase Kdur 20 meq bid x 3 days then 20 meq daily  The pt did not have much relief of her lower extremity edema after this.... she had swelling up to her knees and some "water  blisters"... no abdominal swelling and her breathing has been okay... she elevates them when sitting but not much relief.   Her next OV with Dr Renna Cary is 02/19/24... she is asking if there is anything we can do prior to give her relief.

## 2024-02-09 NOTE — Telephone Encounter (Signed)
 Left pt a message to call the office back, to endorse recommendations below per Dr. Renna Cary.   Hugh Madura, MD  [facility] Triage Cc: Thaddeus Filippo, RN Caller: Unspecified (Today,  8:10 AM) Lets increase the Lasix  to 80 mg twice a day for the time being. Check basic metabolic profile in 1 week.

## 2024-02-09 NOTE — Telephone Encounter (Signed)
 Pt c/o swelling: STAT is pt has developed SOB within 24 hours  How much weight have you gained and in what time span? Since the last appointment   If swelling, where is the swelling located? Feet and legs, now is starting to have blisters  Are you currently taking a fluid pill? furosemide  (LASIX ) 40 MG tablet   Are you currently SOB? No  Do you have a log of your daily weights (if so, list)? N/A  Have you gained 3 pounds in a day or 5 pounds in a week? Unsure  Have you traveled recently? No

## 2024-02-09 NOTE — Telephone Encounter (Signed)
 LVMTCB 02/09/24 at 08:40 am.

## 2024-02-09 NOTE — Telephone Encounter (Signed)
Pt's daughter returning nurses call. Please advise 

## 2024-02-10 DIAGNOSIS — I5033 Acute on chronic diastolic (congestive) heart failure: Secondary | ICD-10-CM | POA: Diagnosis not present

## 2024-02-10 DIAGNOSIS — F5101 Primary insomnia: Secondary | ICD-10-CM | POA: Diagnosis not present

## 2024-02-10 DIAGNOSIS — E43 Unspecified severe protein-calorie malnutrition: Secondary | ICD-10-CM | POA: Diagnosis not present

## 2024-02-10 DIAGNOSIS — I4811 Longstanding persistent atrial fibrillation: Secondary | ICD-10-CM | POA: Diagnosis not present

## 2024-02-10 DIAGNOSIS — S91301A Unspecified open wound, right foot, initial encounter: Secondary | ICD-10-CM | POA: Diagnosis not present

## 2024-02-10 DIAGNOSIS — D519 Vitamin B12 deficiency anemia, unspecified: Secondary | ICD-10-CM | POA: Diagnosis not present

## 2024-02-10 DIAGNOSIS — E785 Hyperlipidemia, unspecified: Secondary | ICD-10-CM | POA: Diagnosis not present

## 2024-02-10 DIAGNOSIS — R197 Diarrhea, unspecified: Secondary | ICD-10-CM | POA: Diagnosis not present

## 2024-02-10 DIAGNOSIS — N1832 Chronic kidney disease, stage 3b: Secondary | ICD-10-CM | POA: Diagnosis not present

## 2024-02-10 DIAGNOSIS — D696 Thrombocytopenia, unspecified: Secondary | ICD-10-CM | POA: Diagnosis not present

## 2024-02-10 DIAGNOSIS — I25119 Atherosclerotic heart disease of native coronary artery with unspecified angina pectoris: Secondary | ICD-10-CM | POA: Diagnosis not present

## 2024-02-10 DIAGNOSIS — I130 Hypertensive heart and chronic kidney disease with heart failure and stage 1 through stage 4 chronic kidney disease, or unspecified chronic kidney disease: Secondary | ICD-10-CM | POA: Diagnosis not present

## 2024-02-10 DIAGNOSIS — E559 Vitamin D deficiency, unspecified: Secondary | ICD-10-CM | POA: Diagnosis not present

## 2024-02-10 DIAGNOSIS — K219 Gastro-esophageal reflux disease without esophagitis: Secondary | ICD-10-CM | POA: Diagnosis not present

## 2024-02-10 DIAGNOSIS — I509 Heart failure, unspecified: Secondary | ICD-10-CM | POA: Diagnosis not present

## 2024-02-10 DIAGNOSIS — I480 Paroxysmal atrial fibrillation: Secondary | ICD-10-CM | POA: Diagnosis not present

## 2024-02-10 DIAGNOSIS — M069 Rheumatoid arthritis, unspecified: Secondary | ICD-10-CM | POA: Diagnosis not present

## 2024-02-11 ENCOUNTER — Telehealth: Payer: Self-pay | Admitting: Cardiology

## 2024-02-11 DIAGNOSIS — E785 Hyperlipidemia, unspecified: Secondary | ICD-10-CM | POA: Diagnosis not present

## 2024-02-11 DIAGNOSIS — I10 Essential (primary) hypertension: Secondary | ICD-10-CM | POA: Diagnosis not present

## 2024-02-11 DIAGNOSIS — M6281 Muscle weakness (generalized): Secondary | ICD-10-CM | POA: Diagnosis not present

## 2024-02-11 DIAGNOSIS — R5381 Other malaise: Secondary | ICD-10-CM | POA: Diagnosis not present

## 2024-02-11 DIAGNOSIS — M6259 Muscle wasting and atrophy, not elsewhere classified, multiple sites: Secondary | ICD-10-CM | POA: Diagnosis not present

## 2024-02-11 DIAGNOSIS — E559 Vitamin D deficiency, unspecified: Secondary | ICD-10-CM | POA: Diagnosis not present

## 2024-02-11 DIAGNOSIS — Z131 Encounter for screening for diabetes mellitus: Secondary | ICD-10-CM | POA: Diagnosis not present

## 2024-02-11 NOTE — Telephone Encounter (Signed)
 [name], home health occupational therapist, with like to establish HR parameters + orders to continue seeing patient. He says the patient is ready to be more active, but he advised her not to do any activity is HR exceeds 100, but her resting HR is about 94 bpm currently. Please advise.

## 2024-02-11 NOTE — Telephone Encounter (Signed)
 Per OV note by Lenze on 01/20/24: Persistent atrial fibrillation  - not cleared to start AC - HR 111/m at rest.  - diltiazem  300 mg daily -increase metoprolol  50 mg bid -PT & OT parameters given -patient prefers to try rate control at home vs going back to the hospital  Home health now calling for parameters. Will route to primary MD to advise.

## 2024-02-11 NOTE — Telephone Encounter (Signed)
 Alison Madura, MD  You39 minutes ago (4:31 PM)    OK to exercise with PT and OT. Pause exercise if HR > 150 bpm Dorothye Gathers, MD   Attempted to reach [name], but number taken down by operator is wrong. I called patient's number and left detailed message per DPR of Dr Renna Cary' comments and asked that they have Jim call us  back if needing to discuss further.

## 2024-02-12 DIAGNOSIS — Z515 Encounter for palliative care: Secondary | ICD-10-CM | POA: Diagnosis not present

## 2024-02-12 NOTE — Telephone Encounter (Signed)
 Attempted to contact pt regarding increased dose of Furosemide  and to repeat lab in 1 week.  Call goes directly to voicemail.  Left message attempting to verify she had received instructions and to call back if any questions or concerns.  She is scheduled for f/u 5/15 with Dr Renna Cary

## 2024-02-13 DIAGNOSIS — Z131 Encounter for screening for diabetes mellitus: Secondary | ICD-10-CM | POA: Diagnosis not present

## 2024-02-13 DIAGNOSIS — E785 Hyperlipidemia, unspecified: Secondary | ICD-10-CM | POA: Diagnosis not present

## 2024-02-13 DIAGNOSIS — I10 Essential (primary) hypertension: Secondary | ICD-10-CM | POA: Diagnosis not present

## 2024-02-16 ENCOUNTER — Encounter (HOSPITAL_COMMUNITY): Payer: Self-pay | Admitting: *Deleted

## 2024-02-16 ENCOUNTER — Other Ambulatory Visit: Payer: Self-pay

## 2024-02-16 ENCOUNTER — Emergency Department (HOSPITAL_COMMUNITY)
Admission: EM | Admit: 2024-02-16 | Discharge: 2024-02-17 | Disposition: A | Discharge status: Skilled Nursing Facility | Attending: Emergency Medicine | Admitting: Emergency Medicine

## 2024-02-16 DIAGNOSIS — E876 Hypokalemia: Secondary | ICD-10-CM | POA: Diagnosis not present

## 2024-02-16 DIAGNOSIS — I2510 Atherosclerotic heart disease of native coronary artery without angina pectoris: Secondary | ICD-10-CM | POA: Insufficient documentation

## 2024-02-16 DIAGNOSIS — Z853 Personal history of malignant neoplasm of breast: Secondary | ICD-10-CM | POA: Insufficient documentation

## 2024-02-16 DIAGNOSIS — R7989 Other specified abnormal findings of blood chemistry: Secondary | ICD-10-CM | POA: Diagnosis present

## 2024-02-16 DIAGNOSIS — R7889 Finding of other specified substances, not normally found in blood: Secondary | ICD-10-CM | POA: Diagnosis not present

## 2024-02-16 DIAGNOSIS — Z85828 Personal history of other malignant neoplasm of skin: Secondary | ICD-10-CM | POA: Diagnosis not present

## 2024-02-16 DIAGNOSIS — I5032 Chronic diastolic (congestive) heart failure: Secondary | ICD-10-CM | POA: Diagnosis not present

## 2024-02-16 DIAGNOSIS — I130 Hypertensive heart and chronic kidney disease with heart failure and stage 1 through stage 4 chronic kidney disease, or unspecified chronic kidney disease: Secondary | ICD-10-CM | POA: Insufficient documentation

## 2024-02-16 DIAGNOSIS — Z7982 Long term (current) use of aspirin: Secondary | ICD-10-CM | POA: Insufficient documentation

## 2024-02-16 DIAGNOSIS — N1832 Chronic kidney disease, stage 3b: Secondary | ICD-10-CM | POA: Insufficient documentation

## 2024-02-16 DIAGNOSIS — Z131 Encounter for screening for diabetes mellitus: Secondary | ICD-10-CM | POA: Diagnosis not present

## 2024-02-16 DIAGNOSIS — I10 Essential (primary) hypertension: Secondary | ICD-10-CM | POA: Diagnosis not present

## 2024-02-16 DIAGNOSIS — Z79899 Other long term (current) drug therapy: Secondary | ICD-10-CM | POA: Insufficient documentation

## 2024-02-16 DIAGNOSIS — R600 Localized edema: Secondary | ICD-10-CM | POA: Diagnosis not present

## 2024-02-16 DIAGNOSIS — E785 Hyperlipidemia, unspecified: Secondary | ICD-10-CM | POA: Diagnosis not present

## 2024-02-16 DIAGNOSIS — Z8543 Personal history of malignant neoplasm of ovary: Secondary | ICD-10-CM | POA: Insufficient documentation

## 2024-02-16 LAB — CBC WITH DIFFERENTIAL/PLATELET
Abs Immature Granulocytes: 0.1 10*3/uL — ABNORMAL HIGH (ref 0.00–0.07)
Basophils Absolute: 0.1 10*3/uL (ref 0.0–0.1)
Basophils Relative: 1 %
Eosinophils Absolute: 0.1 10*3/uL (ref 0.0–0.5)
Eosinophils Relative: 1 %
HCT: 38.7 % (ref 36.0–46.0)
Hemoglobin: 12.3 g/dL (ref 12.0–15.0)
Immature Granulocytes: 1 %
Lymphocytes Relative: 11 %
Lymphs Abs: 1 10*3/uL (ref 0.7–4.0)
MCH: 30.5 pg (ref 26.0–34.0)
MCHC: 31.8 g/dL (ref 30.0–36.0)
MCV: 96 fL (ref 80.0–100.0)
Monocytes Absolute: 0.6 10*3/uL (ref 0.1–1.0)
Monocytes Relative: 7 %
Neutro Abs: 7.3 10*3/uL (ref 1.7–7.7)
Neutrophils Relative %: 79 %
Platelets: 290 10*3/uL (ref 150–400)
RBC: 4.03 MIL/uL (ref 3.87–5.11)
RDW: 13.9 % (ref 11.5–15.5)
WBC: 9.2 10*3/uL (ref 4.0–10.5)
nRBC: 0 % (ref 0.0–0.2)

## 2024-02-16 LAB — COMPREHENSIVE METABOLIC PANEL WITH GFR
ALT: 42 U/L (ref 0–44)
AST: 27 U/L (ref 15–41)
Albumin: 2.8 g/dL — ABNORMAL LOW (ref 3.5–5.0)
Alkaline Phosphatase: 138 U/L — ABNORMAL HIGH (ref 38–126)
Anion gap: 9 (ref 5–15)
BUN: 14 mg/dL (ref 8–23)
CO2: 20 mmol/L — ABNORMAL LOW (ref 22–32)
Calcium: 6.1 mg/dL — CL (ref 8.9–10.3)
Chloride: 114 mmol/L — ABNORMAL HIGH (ref 98–111)
Creatinine, Ser: 1.43 mg/dL — ABNORMAL HIGH (ref 0.44–1.00)
GFR, Estimated: 35 mL/min — ABNORMAL LOW (ref >60)
Glucose, Bld: 123 mg/dL — ABNORMAL HIGH (ref 70–99)
Potassium: 3.2 mmol/L — ABNORMAL LOW (ref 3.5–5.1)
Sodium: 143 mmol/L (ref 135–145)
Total Bilirubin: 0.7 mg/dL (ref 0.0–1.2)
Total Protein: 6.1 g/dL — ABNORMAL LOW (ref 6.5–8.1)

## 2024-02-16 LAB — BRAIN NATRIURETIC PEPTIDE: B Natriuretic Peptide: 1085.2 pg/mL — ABNORMAL HIGH (ref 0.0–100.0)

## 2024-02-16 LAB — MAGNESIUM: Magnesium: 0.6 mg/dL — CL (ref 1.7–2.4)

## 2024-02-16 MED ORDER — MAGNESIUM OXIDE -MG SUPPLEMENT 400 (240 MG) MG PO TABS
800.0000 mg | ORAL_TABLET | Freq: Once | ORAL | Status: AC
  Administered 2024-02-16: 800 mg via ORAL
  Filled 2024-02-16: qty 2

## 2024-02-16 MED ORDER — CALCIUM GLUCONATE-NACL 1-0.675 GM/50ML-% IV SOLN
1.0000 g | Freq: Once | INTRAVENOUS | Status: AC
  Administered 2024-02-16: 1000 mg via INTRAVENOUS
  Filled 2024-02-16: qty 50

## 2024-02-16 MED ORDER — POTASSIUM CHLORIDE CRYS ER 20 MEQ PO TBCR
40.0000 meq | EXTENDED_RELEASE_TABLET | Freq: Once | ORAL | Status: AC
  Administered 2024-02-16: 40 meq via ORAL
  Filled 2024-02-16: qty 2

## 2024-02-16 MED ORDER — ALBUMIN HUMAN 25 % IV SOLN
25.0000 g | Freq: Once | INTRAVENOUS | Status: AC
  Administered 2024-02-16: 25 g via INTRAVENOUS
  Filled 2024-02-16: qty 100

## 2024-02-16 MED ORDER — MAGNESIUM SULFATE 50 % IJ SOLN: 1.0000 g | Freq: Once | INTRAMUSCULAR | Status: DC

## 2024-02-16 MED ORDER — MAGNESIUM SULFATE IN D5W 1-5 GM/100ML-% IV SOLN
1.0000 g | Freq: Once | INTRAVENOUS | Status: AC
  Administered 2024-02-16: 1 g via INTRAVENOUS
  Filled 2024-02-16: qty 100

## 2024-02-16 NOTE — ED Provider Notes (Signed)
 Care assumed at 2330.  Patient referred to the emergency department for abnormal labs.  Care assumed pending repeat magnesium .  Magnesium  is improving it is up to 1.  She is asymptomatic and requests discharge at this time.  She has been on increased furosemide .  She has chronic wounds to bilateral feet, no evidence of acute soft tissue infection at this time.  Will start on magnesium  replacement.  She is on calcium , potassium.  Discussed she will need repeat labs checked soon.  Of note she does have a history of atrial fibrillation, now permanent A-fib, has been running tachycardic at baseline on outpatient visits and during her ED stay.  Heart rates around 110 in A-fib.  No QT prolongation.  She is not symptomatic from her tachycardia.  She did miss her evening metoprolol  dose, this was provided.  Discussed that she will need to return if she has any new or concerning symptoms.    Kelsey Patricia, MD 02/17/24 [phone]

## 2024-02-16 NOTE — ED Provider Triage Note (Signed)
 Emergency Medicine Provider Triage Evaluation Note  [name] , a 89 y.o. female  was evaluated in triage.  Pt complains of abnormal labs.  Was told she had low potassium.  She recently had increase in her Lasix  due to lower extremity edema.  She is compliant with her home oral potassium.  She denies any numbness or weakness.  No chest pain or shortness of breath.  States she was recently told she had A-fib however is not anticoagulated.  Review of Systems  Positive: Lower extremity edema Negative:   Physical Exam  BP (!) 148/82   Pulse 99   Temp 98.5 F (36.9 C) (Oral)   Resp 18   LMP 10/07/1974   SpO2 95%  Gen:   Awake, no distress   Resp:  Normal effort  MSK:   Moves extremities without difficulty, pitting lower extremity edema to feet with weeping wounds Other:    Medical Decision Making  Medically screening exam initiated at 4:39 PM.  Appropriate orders placed.  YUNA GOLIAS was informed that the remainder of the evaluation will be completed by another provider, this initial triage assessment does not replace that evaluation, and the importance of remaining in the ED until their evaluation is complete.  Abnormal labs   Kura Bethards A, PA-C 02/16/24 1641

## 2024-02-16 NOTE — ED Provider Notes (Signed)
 [place] EMERGENCY DEPARTMENT AT [facility] Provider Note  CSN: [id number] Arrival date & time: 02/16/24 1433  Chief Complaint(s) abnormal labs (Pt arrives via ems from [facility] for low potassium (2.9 by facility draw). VSS, no pain, no complaints. Bilateral legs weeping)  HPI [name] is a 89 y.o. female with PMH CAD, HTN, HLD, paroxysmal A-fib who presents emerged part for evaluation of abnormal labs.  Patient lives at a facility who obtained labs and found her to be hypokalemic at 2.9 so they sent her to the emergency department for further evaluation.  Patient has chronic lower extremity edema and wound care is being applied at the facility.  Recently did have an increase in her Lasix .  Denies shortness of breath, chest pain, abdominal pain, nausea, vomiting, headache, fever or other systemic symptoms.   Past Medical History 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   Breast cancer (HCC) 2010   RIGHT   CAD (coronary artery disease)    Dysuria    Family history of breast cancer    GERD (gastroesophageal reflux disease)    HX OF   Heart murmur    History of blood transfusion    History of breast cancer ONCOLOGIST-- DR Scherrie Curt   DX 2010--  RIGHT BREAST DCIS  HIGH GRADE S/P LUMPECTOMY W/ SLN BX--  NO RECURRENCE   History of kidney stones    History of ovarian cancer    2009--  S/P COLON RESECTION FOR PAPILLARY SEROUS CARCINOMA, BORDERLINE OVARIAN CANCER VERSUS PRIMARY PERITONEAL CARCINOMA WITH PSAMMOMA BODIES--  NO RECURRENCE   History of skin cancer    excision basal cell   Hyperlipidemia    Hypertension    Kidney stones 04/2013   Nocturia    Ovarian carcinoma (HCC)    papillary serous-low grade, recurrence found in fat necrosis during ileostomy   Renal stones 2014   Shingles 30 YRS AGO   Ureteral calculi    bilateral   Patient Active Problem List   Diagnosis Date Noted    Elevated troponin 01/02/2024   Electrolyte abnormality 12/27/2023   Persistent atrial fibrillation (HCC) 12/27/2023   Diastolic CHF, chronic (HCC) 12/25/2023   Paroxysmal atrial fibrillation with RVR (HCC) 12/23/2023   Acute on chronic heart failure with preserved ejection fraction (HCC) 12/23/2023   Chronic kidney disease, stage 3b (HCC) 12/23/2023   Thrombocytopenia (HCC) 12/06/2023   Atrial fibrillation with RVR (HCC) 12/05/2023   Medication management 12/03/2023   Counseling and coordination of care 12/02/2023   Odynophagia 12/02/2023   Palliative care encounter 12/02/2023   Protein-calorie malnutrition, severe 12/02/2023   Failure to thrive 11/30/2023   Dysphagia 11/30/2023   Bicytopenia 11/27/2023   Hypokalemia 11/27/2023   New onset a-fib (HCC) 11/24/2023   Hypertrophic cardiomyopathy (HCC) 11/24/2023   Demand ischemia (HCC) 11/24/2023   Chronic anemia 11/24/2023   Malignant neoplasm of ovary (HCC) 11/24/2023   AKI (acute kidney injury) (HCC) 11/21/2023   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) 11/12/2023   Infection of hand 02/02/2022   Genetic testing 07/20/2019   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/20/2019   Family history of breast cancer    Carcinoma of upper-outer quadrant of left breast in female, estrogen receptor positive (HCC) 05/19/2019   Left carotid bruit 01/21/2017   Hypertension    Hyperlipidemia    GERD (gastroesophageal reflux disease)    Ureteral  calculi    History of breast cancer    History of ovarian cancer    History of skin cancer    Frequency of urination    Dysuria    Kidney stones    CAD (coronary artery disease)    Ductal carcinoma (HCC) 08/11/2013   Serous tumor, 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   Home Medication(s) Prior to Admission medications   Medication Sig Start Date End Date Taking? Authorizing Provider  aspirin  EC 81 MG tablet Take 81 mg by mouth daily.    [provider]  BELSOMRA  10 MG TABS Take 10 mg by mouth at bedtime. 01/20/19   [provider]  Calcium  Carbonate-Vitamin D  (CALTRATE 600+D PO) Take 1 tablet by mouth 2 (two) times daily.    [provider]  Cholecalciferol  (VITAMIN D3) 50 MCG (2000 UT) TABS Take 2,000 Units by mouth in the morning and at bedtime.    [provider]  cyanocobalamin  (VITAMIN B12) 1000 MCG tablet Take 1,000 mcg by mouth daily.    [provider]  diltiazem  (CARDIZEM  CD) 300 MG 24 hr capsule Take 1 capsule (300 mg total) by mouth daily. 01/04/24 02/03/24  Sira, Zackery, MD  furosemide  (LASIX ) 40 MG tablet Take 1 tablet (40 mg total) by mouth 2 (two) times daily. 01/27/24 01/26/25  Hugh Madura, MD  hydroxychloroquine  (PLAQUENIL ) 200 MG tablet Take 200 mg by mouth daily. 11/08/21   [provider]  isosorbide  mononitrate (IMDUR ) 60 MG 24 hr tablet Take 1 tablet (60 mg total) by mouth daily. 05/16/23   Hugh Madura, MD  loperamide  (IMODIUM ) 2 MG capsule Take 1 capsule (2 mg total) by mouth as needed for diarrhea or loose stools. Patient taking differently: Take 4 mg by mouth as needed for diarrhea or loose stools. 12/04/23   Ezenduka, Nkeiruka J, MD  melatonin 3 MG TABS tablet Take 1 tablet (3 mg total) by mouth at bedtime as needed. 12/29/23   Lesa Rape, MD  metoprolol  tartrate (LOPRESSOR ) 50 MG tablet Take 1 tablet (50 mg total) by mouth 2 (two) times daily. 01/20/24 04/19/24  Flo Hummingbird, PA-C  Multiple Vitamins-Minerals (MULTIVITAMIN WOMEN 50+) TABS Take 1 tablet by mouth daily with breakfast.    [provider]  pantoprazole  (PROTONIX ) 20 MG tablet Take 1 tablet (20 mg total) by mouth daily. 12/05/23   Ezenduka, Nkeiruka J, MD  potassium chloride  (KLOR-CON ) 20 MEQ packet Take 20 mEq by mouth 2 (two) times daily. 01/27/24   Hugh Madura, MD  PROCTOZONE -HC 2.5 % rectal cream PLACE 1 APPLICATION RECTALLY 2 TIMES A DAY AS NEEDED FOR HEMORRHOIDS. 01/09/15   Cross, Melissa D, NP  rosuvastatin   (CRESTOR ) 10 MG tablet Take 10 mg by mouth at bedtime.    [provider]  sodium bicarbonate  650 MG tablet Take 1 tablet (650 mg total) by mouth 2 (two) times daily. 12/08/23   [name], [name], MD  sucralfate  (CARAFATE ) 1 GM/10ML suspension Take 10 mLs (1 g total) by mouth 4 (four) times daily -  with meals and at bedtime. Patient taking differently: Take 1 g by mouth 4 (four) times daily. 12/04/23   Ezenduka, Nkeiruka J, MD  tiZANidine  (ZANAFLEX ) 4 MG tablet Take 4 mg by mouth 2 (two) times daily as needed for muscle spasms. 10/10/23   [provider]  triamcinolone  (KENALOG) 0.1 % Apply 1 application  topically as needed (for rashes- affected areas). 04/04/20   [provider]  Wound Dressings (SONAFINE)  Apply 1 Application topically 2 (two) times daily as needed (Apply to areas of redness from radiation as needed. Call Dr. Barrie Borer nurse at [phone] if you have any questions). 12/04/23   Veronica Gordon, MD                                                                                                                                    Past Surgical History Past Surgical History:  Procedure Laterality Date   APPENDECTOMY     BREAST LUMPECTOMY Right 04-13-2009   W/ SLN BX   BREAST LUMPECTOMY WITH RADIOACTIVE SEED LOCALIZATION Left 06/11/2019   Procedure: LEFT BREAST LUMPECTOMY WITH RADIOACTIVE SEED LOCALIZATION;  Surgeon: Caralyn Chandler, MD;  Location: [facility] OR;  Service: General;  Laterality: Left;   CARDIAC CATHETERIZATION  11-11-2005 DR Kay Parson   MODERATE LAD DISEASE/ NORMAL LVF/ EF 65-75%   CATARACT EXTRACTION W/ INTRAOCULAR LENS IMPLANT Bilateral 04/26/2013   CHOLECYSTECTOMY  1990   OPEN   COLONOSCOPY     CYSTOSCOPY W/ URETERAL STENT PLACEMENT Bilateral 05/07/2013   Procedure: CYSTOSCOPY WITH STENT REPLACEMENTS;  Surgeon: Osborn Blaze, MD;  Location: [facility];  Service: Urology;  Laterality: Bilateral;   CYSTOSCOPY WITH BIOPSY N/A 04/07/2013    Procedure: CYSTOSCOPY WITH BIOPSY and fulgeration;  Surgeon: Osborn Blaze, MD;  Location: [facility];  Service: Urology;  Laterality: N/A;   CYSTOSCOPY WITH RETROGRADE PYELOGRAM, URETEROSCOPY AND STENT PLACEMENT Bilateral 05/07/2013   Procedure: CYSTOSCOPY WITH RETROGRADE PYELOGRAM, URETEROSCOPY ;  Surgeon: Osborn Blaze, MD;  Location: [facility];  Service: Urology;  Laterality: Bilateral;   CYSTOSCOPY WITH RETROGRADE PYELOGRAM, URETEROSCOPY AND STENT PLACEMENT Bilateral 01/06/2019   Procedure: CYSTOSCOPY WITH RETROGRADE PYELOGRAM, URETEROSCOPY AND STENT PLACEMENT, FIRST STAGE;  Surgeon: Osborn Blaze, MD;  Location: [facility];  Service: Urology;  Laterality: Bilateral;   CYSTOSCOPY WITH RETROGRADE PYELOGRAM, URETEROSCOPY AND STENT PLACEMENT Bilateral 01/27/2019   Procedure: CYSTOSCOPY WITH RETROGRADE PYELOGRAM, URETEROSCOPY AND STENT PLACEMENT;  Surgeon: Osborn Blaze, MD;  Location: [facility];  Service: Urology;  Laterality: Bilateral;  75 MINS   CYSTOSCOPY WITH STENT PLACEMENT Bilateral 04/07/2013   Procedure: CYSTOSCOPY WITH STENT PLACEMENT;  Surgeon: Osborn Blaze, MD;  Location: [facility];  Service: Urology;  Laterality: Bilateral;   DX LAPAROSCOPY W/ PERITONEAL AND OMENTAL BX'S AND WASHINGS  02-16-2008   EXCISION RIGHT BREAST MASS  02-10-2002   EXP. LAP. EXTENSIVE ADHESIOLYSIS/ RESECTION TERMINAL ILEUM , ASCENDING AND DESCENDING COLON WITH CREATION ILEOSTOMY AND MUCOUS FISTULA  02-19-2008   PERFERATION AND ABD. CANCER--  TAKEDOWN ILEOSTOMY 08-23-2008   HOLMIUM LASER APPLICATION Bilateral 01/06/2019   Procedure: HOLMIUM LASER APPLICATION;  Surgeon: Osborn Blaze, MD;  Location: [facility];  Service: Urology;  Laterality: Bilateral;   HOLMIUM LASER APPLICATION Bilateral 01/27/2019   Procedure: HOLMIUM LASER APPLICATION;  Surgeon: Osborn Blaze, MD;  Location: [facility];  Service: Urology;  Laterality: Bilateral;   I & D EXTREMITY Right  02/02/2022   Procedure: IRRIGATION AND DEBRIDEMENT HAND;   Surgeon: Arvil Birks, MD;  Location: [facility] OR;  Service: Orthopedics;  Laterality: Right;   ILEOSTOMY CLOSURE  08/2008   TOTAL ABDOMINAL HYSTERECTOMY W/ BILATERAL SALPINGOOPHORECTOMY  1976   TOTAL ABDOMINAL HYSTERECTOMY W/ BILATERAL SALPINGOOPHORECTOMY  1976   VULVAR LESION REMOVAL N/A 08/22/2014   Procedure: EXCISION OF VULVAR CYST  ;  Surgeon: Axel Bohr, MD;  Location: [facility];  Service: Gynecology;  Laterality: N/A;   WOUND DEBRIDEMENT  05/28/2012   Procedure: DEBRIDEMENT ABDOMINAL WOUND;  Surgeon: Darcella Earnest, MD;  Location: [facility];  Service: General;  Laterality: N/A;  excision chronic wound abdominal wall   Family History Family History  Problem Relation Age of Onset   Heart disease Mother    Hyperlipidemia Mother    Breast cancer Sister    Cancer Sister        breast    Social History Social History   Tobacco Use   Smoking status: Never   Smokeless tobacco: Never  Vaping Use   Vaping status: Never Used  Substance Use Topics   Alcohol use: No   Drug use: No   Allergies Ramipril  Review of Systems Review of Systems  Cardiovascular:  Positive for leg swelling.    Physical Exam Vital Signs  I have reviewed the triage vital signs BP (!) 158/96   Pulse (!) 105   Temp 97.7 F (36.5 C) (Oral)   Resp 17   LMP 10/07/1974   SpO2 100%   Physical Exam Vitals and nursing note reviewed.  Constitutional:      General: She is not in acute distress.    Appearance: She is well-developed.  HENT:     Head: Normocephalic and atraumatic.  Eyes:     Conjunctiva/sclera: Conjunctivae normal.  Cardiovascular:     Rate and Rhythm: Normal rate and regular rhythm.     Heart sounds: No murmur heard. Pulmonary:     Effort: Pulmonary effort is normal. No respiratory distress.     Breath sounds: Normal breath sounds.  Abdominal:     Palpations: Abdomen is soft.     Tenderness: There is no abdominal tenderness.  Musculoskeletal:         General: No swelling.     Cervical back: Neck supple.     Right lower leg: Edema present.     Left lower leg: Edema present.  Skin:    General: Skin is warm and dry.     Capillary Refill: Capillary refill takes less than 2 seconds.  Neurological:     Mental Status: She is alert.  Psychiatric:        Mood and Affect: Mood normal.     ED Results and Treatments Labs (all labs ordered are listed, but only abnormal results are displayed) Labs Reviewed  CBC WITH DIFFERENTIAL/PLATELET - Abnormal; Notable for the following components:      Result Value   Abs Immature Granulocytes 0.10 (*)    All other components within normal limits  COMPREHENSIVE METABOLIC PANEL WITH GFR - Abnormal; Notable for the following components:   Potassium 3.2 (*)    Chloride 114 (*)    CO2 20 (*)    Glucose, Bld 123 (*)    Creatinine, Ser 1.43 (*)    Calcium  6.1 (*)    Total Protein 6.1 (*)    Albumin 2.8 (*)    Alkaline Phosphatase 138 (*)    GFR, Estimated 35 (*)  All other components within normal limits  MAGNESIUM  - Abnormal; Notable for the following components:   Magnesium  0.6 (*)    All other components within normal limits  BRAIN NATRIURETIC PEPTIDE - Abnormal; Notable for the following components:   B Natriuretic Peptide 1,085.2 (*)    All other components within normal limits  I-STAT CHEM 8, ED                                                                                                                          Radiology No results found.  Pertinent labs & imaging results that were available during my care of the patient were reviewed by me and considered in my medical decision making (see MDM for details).  Medications Ordered in ED Medications  calcium  gluconate 1 g/ 50 mL sodium chloride  IVPB (1,000 mg Intravenous New Bag/Given 02/16/24 2228)  magnesium  sulfate IVPB 1 g 100 mL (1 g Intravenous New Bag/Given 02/16/24 2220)  magnesium  oxide (MAG-OX) tablet 800 mg (800 mg Oral Given  02/16/24 1950)  potassium chloride  SA (KLOR-CON  M) CR tablet 40 mEq (40 mEq Oral Given 02/16/24 1950)  albumin human 25 % solution 25 g (25 g Intravenous New Bag/Given 02/16/24 2215)                                                                                                                                     Procedures .Critical Care  Performed by: Karlyn Overman, MD Authorized by: Karlyn Overman, MD   Critical care provider statement:    Critical care time (minutes):  30   Critical care was necessary to treat or prevent imminent or life-threatening deterioration of the following conditions:  Metabolic crisis   Critical care was time spent personally by me on the following activities:  Development of treatment plan with patient or surrogate, discussions with consultants, evaluation of patient's response to treatment, examination of patient, ordering and review of laboratory studies, ordering and review of radiographic studies, ordering and performing treatments and interventions, pulse oximetry, re-evaluation of patient's condition and review of old charts   (including critical care time)  Medical Decision Making / ED Course   This patient presents to the ED for concern of abnormal labs, this involves an extensive number of treatment options, and is a complaint that carries with it a high risk of complications and morbidity.  The differential diagnosis includes medication  side effect, decreased p.o. intake, overdiuresis, CHF exacerbation  MDM: Patient seen emergency room for evaluation of abnormal labs.  Physical exam with significant bilateral lower extremity edema with erythema and stasis dermatitis but is otherwise unremarkable.  Laboratory evaluation with a relatively normal potassium at 3.2 but she does have significant hypocalcemia at 6.1, hypomagnesemia to 0.6, BNP 1085.  Electrolytes repleted both orally and IV and at time of signout patient pending repeat electrolyte panel.   Chvostek sign is negative and I have low suspicion for truly symptomatic hypocalcemia or hypomagnesemia and I do think that she likely could be discharged with supplementation if we are seeing improvement with repletion here in the ER.  Please see provider signout for continuation of workup.   Additional history obtained:  -External records from outside source obtained and reviewed including: Chart review including previous notes, labs, imaging, consultation notes   Lab Tests: -I ordered, reviewed, and interpreted labs.   The pertinent results include:   Labs Reviewed  CBC WITH DIFFERENTIAL/PLATELET - Abnormal; Notable for the following components:      Result Value   Abs Immature Granulocytes 0.10 (*)    All other components within normal limits  COMPREHENSIVE METABOLIC PANEL WITH GFR - Abnormal; Notable for the following components:   Potassium 3.2 (*)    Chloride 114 (*)    CO2 20 (*)    Glucose, Bld 123 (*)    Creatinine, Ser 1.43 (*)    Calcium  6.1 (*)    Total Protein 6.1 (*)    Albumin 2.8 (*)    Alkaline Phosphatase 138 (*)    GFR, Estimated 35 (*)    All other components within normal limits  MAGNESIUM  - Abnormal; Notable for the following components:   Magnesium  0.6 (*)    All other components within normal limits  BRAIN NATRIURETIC PEPTIDE - Abnormal; Notable for the following components:   B Natriuretic Peptide 1,085.2 (*)    All other components within normal limits  I-STAT CHEM 8, ED      Medicines ordered and prescription drug management: Meds ordered this encounter  Medications   magnesium  oxide (MAG-OX) tablet 800 mg   potassium chloride  SA (KLOR-CON  M) CR tablet 40 mEq   DISCONTD: magnesium  sulfate (IV Push/IM) injection 1 g   calcium  gluconate 1 g/ 50 mL sodium chloride  IVPB   magnesium  sulfate IVPB 1 g 100 mL   albumin human 25 % solution 25 g    -I have reviewed the patients home medicines and have made adjustments as needed  Critical  interventions Electrolyte repletion    Cardiac Monitoring: The patient was maintained on a cardiac monitor.  I personally viewed and interpreted the cardiac monitored which showed an underlying rhythm of: NSR  Social Determinants of Health:  Factors impacting patients care include: Lives in skilled nursing facility   Reevaluation: After the interventions noted above, I reevaluated the patient and found that they have :improved  Co morbidities that complicate the patient evaluation  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   Breast cancer (HCC) 2010   RIGHT   CAD (coronary artery disease)    Dysuria    Family history of breast cancer    GERD (gastroesophageal reflux disease)    HX OF   Heart murmur    History of blood transfusion    History of breast cancer ONCOLOGIST-- DR Scherrie Curt   DX  2010--  RIGHT BREAST DCIS  HIGH GRADE S/P LUMPECTOMY W/ SLN BX--  NO RECURRENCE   History of kidney stones    History of ovarian cancer    2009--  S/P COLON RESECTION FOR PAPILLARY SEROUS CARCINOMA, BORDERLINE OVARIAN CANCER VERSUS PRIMARY PERITONEAL CARCINOMA WITH PSAMMOMA BODIES--  NO RECURRENCE   History of skin cancer    excision basal cell   Hyperlipidemia    Hypertension    Kidney stones 04/2013   Nocturia    Ovarian carcinoma (HCC)    papillary serous-low grade, recurrence found in fat necrosis during ileostomy   Renal stones 2014   Shingles 30 YRS AGO   Ureteral calculi    bilateral      Dispostion: I considered admission for this patient, and disposition pending completion of laboratory evaluation.  Please see provider signout note for continuation of workup.     Final Clinical Impression(s) / ED Diagnoses Final diagnoses:  None     @PCDICTATION @    Karlyn Overman, MD 02/16/24 2238

## 2024-02-17 DIAGNOSIS — R5383 Other fatigue: Secondary | ICD-10-CM | POA: Diagnosis not present

## 2024-02-17 DIAGNOSIS — Z7401 Bed confinement status: Secondary | ICD-10-CM | POA: Diagnosis not present

## 2024-02-17 DIAGNOSIS — R600 Localized edema: Secondary | ICD-10-CM | POA: Diagnosis not present

## 2024-02-17 DIAGNOSIS — R609 Edema, unspecified: Secondary | ICD-10-CM | POA: Diagnosis not present

## 2024-02-17 LAB — I-STAT CHEM 8, ED
BUN: 13 mg/dL (ref 8–23)
Calcium, Ion: 0.86 mmol/L — CL (ref 1.15–1.40)
Chloride: 110 mmol/L (ref 98–111)
Creatinine, Ser: 1.4 mg/dL — ABNORMAL HIGH (ref 0.44–1.00)
Glucose, Bld: 94 mg/dL (ref 70–99)
HCT: 28 % — ABNORMAL LOW (ref 36.0–46.0)
Hemoglobin: 9.5 g/dL — ABNORMAL LOW (ref 12.0–15.0)
Potassium: 3.3 mmol/L — ABNORMAL LOW (ref 3.5–5.1)
Sodium: 145 mmol/L (ref 135–145)
TCO2: 21 mmol/L — ABNORMAL LOW (ref 22–32)

## 2024-02-17 LAB — MAGNESIUM: Magnesium: 1 mg/dL — ABNORMAL LOW (ref 1.7–2.4)

## 2024-02-17 MED ORDER — MAGNESIUM SULFATE IN D5W 1-5 GM/100ML-% IV SOLN
1.0000 g | Freq: Once | INTRAVENOUS | Status: AC
  Administered 2024-02-17: 1 g via INTRAVENOUS
  Filled 2024-02-17: qty 100

## 2024-02-17 MED ORDER — METOPROLOL TARTRATE 25 MG PO TABS
50.0000 mg | ORAL_TABLET | Freq: Once | ORAL | Status: AC
  Administered 2024-02-17: 50 mg via ORAL
  Filled 2024-02-17: qty 2

## 2024-02-17 MED ORDER — MAGNESIUM CHLORIDE 64 MG PO TBEC: 1.0000 | DELAYED_RELEASE_TABLET | Freq: Two times a day (BID) | ORAL | 0 refills | Status: DC

## 2024-02-18 NOTE — Telephone Encounter (Signed)
 Pt has not returned call to verify she received prior instructions.  She is scheduled with Dr Renna Cary 02/19/24 and will discuss further at that time.

## 2024-02-19 ENCOUNTER — Ambulatory Visit (HOSPITAL_BASED_OUTPATIENT_CLINIC_OR_DEPARTMENT_OTHER): Admitting: Cardiology

## 2024-02-19 VITALS — BP 120/62 | Temp 77.0°F

## 2024-02-19 DIAGNOSIS — I2510 Atherosclerotic heart disease of native coronary artery without angina pectoris: Secondary | ICD-10-CM

## 2024-02-19 DIAGNOSIS — I4891 Unspecified atrial fibrillation: Secondary | ICD-10-CM

## 2024-02-19 NOTE — Patient Instructions (Addendum)
 Medication Instructions:  Your physician recommends that you continue on your current medications as directed. Please refer to the Current Medication list given to you today.   *If you need a refill on your cardiac medications before your next appointment, please call your pharmacy*  Lab Work: NONE  Testing/Procedures: NONE  Follow-Up: At [facility], you and your health needs are our priority.  As part of our continuing mission to provide you with exceptional heart care, our providers are all part of one team.  This team includes your primary Cardiologist (physician) and Advanced Practice Providers or APPs (Physician Assistants and Nurse Practitioners) who all work together to provide you with the care you need, when you need it.  Your next appointment:   6 month(s)  Provider:    Hollie Luria PA   1 year with Dr Renna Cary   We recommend signing up for the patient portal called "MyChart".  Sign up information is provided on this After Visit Summary.  MyChart is used to connect with patients for Virtual Visits (Telemedicine).  Patients are able to view lab/test results, encounter notes, upcoming appointments, etc.  Non-urgent messages can be sent to your provider as well.   To learn more about what you can do with MyChart, go to [email or web address].

## 2024-02-19 NOTE — Progress Notes (Signed)
 Cardiology Office Note:  .   Date:  02/19/2024  ID:  [name], DOB 06/25/1934, MRN [id number] PCP: Arva Lathe, MD  [name] Cardiologist:  Dorothye Gathers, MD    History of Present Illness: [name]   [name] is a 89 y.o. female Discussed the use of AI scribe software for clinical note transcription with the patient, who gave verbal consent to proceed.  History of Present Illness [name] is an 89 year old female with persistent atrial fibrillation who presents for a one month follow-up. She is accompanied by a family member.  She has a history of persistent atrial fibrillation and is currently not cleared to start anticoagulation. Her heart rate was noted to be 111 at rest. She is on diltiazem  360 mg once daily and metoprolol  tartrate 50 mg twice daily for rate control. She prefers to manage her condition at home rather than returning to the hospital. She feels good overall, with no significant shortness of breath unless exerting herself, and no chest pain. Occasionally, she experiences a little pressure in the mornings, which resolves after taking her medication.  She has a history of non-obstructive coronary artery disease and systolic heart failure. An echocardiogram from December 24, 2023, showed an ejection fraction of 70-75%. She has experienced chest discomfort off and on over the years, which was previously suspected to be due to microvascular disease.  She was recently hospitalized overnight for low magnesium  levels, although she initially went in for potassium concerns. Her potassium was found to be okay, but her magnesium  was low, and she received treatment for it.  She is on multiple medications including aspirin  81 mg, furosemide  40 mg twice daily for swelling, isosorbide  60 mg for chest pain, and atorvastatin 10 mg for cholesterol. She also receives Faslodex  injections monthly for cancer treatment, which may contribute to swelling in her  feet and legs.  She reports having water  blisters on her feet and difficulty wearing shoes. Home health care is supposed to take over wound care for her feet, but they have not yet started treatment. A Doppler test was performed by home health, but she is unsure of the results.  Her sister also has atrial fibrillation, but she has not experienced the same severity of symptoms as her sister.     Studies Reviewed: .        Results LABS K: low (02/16/2024) Mg: low (02/16/2024)  DIAGNOSTIC Echocardiogram: EF 70-75% (12/24/2023) Risk Assessment/Calculations:            Physical Exam:   VS:  BP 120/62   Temp (!) 77 F (25 C)   LMP 10/07/1974   HC 48" (121.9 cm)   SpO2 100%    Wt Readings from Last 3 Encounters:  01/20/24 116 lb (52.6 kg)  01/08/24 116 lb 12.8 oz (53 kg)  01/03/24 116 lb 6.5 oz (52.8 kg)    GEN: Well nourished, well developed in no acute distress NECK: No JVD; No carotid bruits CARDIAC: RRR, no murmurs, no rubs, no gallops RESPIRATORY:  Clear to auscultation without rales, wheezing or rhonchi  ABDOMEN: Soft, non-tender, non-distended EXTREMITIES:  ACE wrapped LE's, toe edema   ASSESSMENT AND PLAN: .    Assessment and Plan Assessment & Plan Persistent atrial fibrillation Persistent atrial fibrillation with a heart rate of 80-100 bpm at rest. Currently on diltiazem  360 mg daily and metoprolol  tartrate 50 mg twice daily. Heart rate is on the upper edge of the desired range but  is fairly well regulated. No current need for dccv as heart rate is controlled. Discussed potential for heart rate to weaken the heart if uncontrolled long-term. She prefers to manage rate control at home rather than returning to the hospital. - Continue diltiazem  360 mg daily. - Continue metoprolol  tartrate 50 mg twice daily. - Exercise permitted as long as heart rate does not exceed 150 bpm.   Diastolic heart failure Diastolic heart failure with non-obstructive coronary artery  disease. Echocardiogram from December 24, 2023, showed an ejection fraction of 70-75%. No current symptoms of chest pain or significant shortness of breath. Heart rate control is a priority to prevent further cardiac complications. - Continue current heart failure management regimen.  Coronary artery disease without angina Non-obstructive coronary artery disease. No current angina or significant chest pain. Previous suspicion of microvascular disease as a cause of chest pain. Current medications include isosorbide  mononitrate for chest pain management. - Continue isosorbide  mononitrate 60 mg daily. - Monitor for any new or worsening chest pain.  Magnesium  deficiency Recent hospitalization for low magnesium  levels. Magnesium  deficiency is ongoing. Potassium levels are adequate. Oral magnesium  supplementation provided to maintain levels. - Continue oral magnesium  supplementation as prescribed. - Monitor magnesium  and potassium levels regularly.  History of cancer Breast cancer with METS to liver spine, current treatment including Faslodex  injections. Concerns about potential contribution of Faslodex  to swelling in feet and legs. No direct cardiac effects anticipated from Faslodex . - Continue Faslodex  injections as scheduled. - Monitor for swelling in feet and legs.         Dispo: 6 months with APP  Signed, Dorothye Gathers, MD

## 2024-02-20 ENCOUNTER — Other Ambulatory Visit: Payer: Self-pay | Admitting: *Deleted

## 2024-02-20 DIAGNOSIS — I130 Hypertensive heart and chronic kidney disease with heart failure and stage 1 through stage 4 chronic kidney disease, or unspecified chronic kidney disease: Secondary | ICD-10-CM | POA: Diagnosis not present

## 2024-02-20 DIAGNOSIS — I5033 Acute on chronic diastolic (congestive) heart failure: Secondary | ICD-10-CM | POA: Diagnosis not present

## 2024-02-20 DIAGNOSIS — D696 Thrombocytopenia, unspecified: Secondary | ICD-10-CM | POA: Diagnosis not present

## 2024-02-20 DIAGNOSIS — Z515 Encounter for palliative care: Secondary | ICD-10-CM | POA: Diagnosis not present

## 2024-02-20 DIAGNOSIS — N1832 Chronic kidney disease, stage 3b: Secondary | ICD-10-CM | POA: Diagnosis not present

## 2024-02-20 DIAGNOSIS — I4811 Longstanding persistent atrial fibrillation: Secondary | ICD-10-CM | POA: Diagnosis not present

## 2024-02-20 DIAGNOSIS — E43 Unspecified severe protein-calorie malnutrition: Secondary | ICD-10-CM | POA: Diagnosis not present

## 2024-02-24 DIAGNOSIS — I480 Paroxysmal atrial fibrillation: Secondary | ICD-10-CM | POA: Diagnosis not present

## 2024-02-24 DIAGNOSIS — I509 Heart failure, unspecified: Secondary | ICD-10-CM | POA: Diagnosis not present

## 2024-02-24 DIAGNOSIS — R52 Pain, unspecified: Secondary | ICD-10-CM | POA: Diagnosis not present

## 2024-02-25 DIAGNOSIS — I5033 Acute on chronic diastolic (congestive) heart failure: Secondary | ICD-10-CM | POA: Diagnosis not present

## 2024-02-25 DIAGNOSIS — N1832 Chronic kidney disease, stage 3b: Secondary | ICD-10-CM | POA: Diagnosis not present

## 2024-02-25 DIAGNOSIS — E43 Unspecified severe protein-calorie malnutrition: Secondary | ICD-10-CM | POA: Diagnosis not present

## 2024-02-25 DIAGNOSIS — I4811 Longstanding persistent atrial fibrillation: Secondary | ICD-10-CM | POA: Diagnosis not present

## 2024-02-25 DIAGNOSIS — D696 Thrombocytopenia, unspecified: Secondary | ICD-10-CM | POA: Diagnosis not present

## 2024-02-25 DIAGNOSIS — I130 Hypertensive heart and chronic kidney disease with heart failure and stage 1 through stage 4 chronic kidney disease, or unspecified chronic kidney disease: Secondary | ICD-10-CM | POA: Diagnosis not present

## 2024-02-26 ENCOUNTER — Inpatient Hospital Stay

## 2024-02-26 ENCOUNTER — Inpatient Hospital Stay: Attending: Oncology

## 2024-02-26 ENCOUNTER — Inpatient Hospital Stay (HOSPITAL_BASED_OUTPATIENT_CLINIC_OR_DEPARTMENT_OTHER): Admitting: Oncology

## 2024-02-26 VITALS — BP 138/62 | HR 105 | Temp 98.2°F | Resp 18 | Ht 66.0 in | Wt 103.0 lb

## 2024-02-26 DIAGNOSIS — D473 Essential (hemorrhagic) thrombocythemia: Secondary | ICD-10-CM | POA: Diagnosis not present

## 2024-02-26 DIAGNOSIS — Z170 Estrogen receptor positive status [ER+]: Secondary | ICD-10-CM | POA: Diagnosis not present

## 2024-02-26 DIAGNOSIS — C50412 Malignant neoplasm of upper-outer quadrant of left female breast: Secondary | ICD-10-CM

## 2024-02-26 DIAGNOSIS — C7951 Secondary malignant neoplasm of bone: Secondary | ICD-10-CM | POA: Diagnosis not present

## 2024-02-26 DIAGNOSIS — Z1509 Genetic susceptibility to other malignant neoplasm: Secondary | ICD-10-CM | POA: Insufficient documentation

## 2024-02-26 DIAGNOSIS — Z1501 Genetic susceptibility to malignant neoplasm of breast: Secondary | ICD-10-CM | POA: Diagnosis not present

## 2024-02-26 DIAGNOSIS — C50919 Malignant neoplasm of unspecified site of unspecified female breast: Secondary | ICD-10-CM

## 2024-02-26 DIAGNOSIS — C787 Secondary malignant neoplasm of liver and intrahepatic bile duct: Secondary | ICD-10-CM | POA: Insufficient documentation

## 2024-02-26 DIAGNOSIS — Z853 Personal history of malignant neoplasm of breast: Secondary | ICD-10-CM | POA: Diagnosis not present

## 2024-02-26 DIAGNOSIS — Z5111 Encounter for antineoplastic chemotherapy: Secondary | ICD-10-CM | POA: Insufficient documentation

## 2024-02-26 LAB — CMP (CANCER CENTER ONLY)
ALT: 68 U/L — ABNORMAL HIGH (ref 0–44)
AST: 57 U/L — ABNORMAL HIGH (ref 15–41)
Albumin: 3.3 g/dL — ABNORMAL LOW (ref 3.5–5.0)
Alkaline Phosphatase: 186 U/L — ABNORMAL HIGH (ref 38–126)
Anion gap: 13 (ref 5–15)
BUN: 21 mg/dL (ref 8–23)
CO2: 20 mmol/L — ABNORMAL LOW (ref 22–32)
Calcium: 7.6 mg/dL — ABNORMAL LOW (ref 8.9–10.3)
Chloride: 110 mmol/L (ref 98–111)
Creatinine: 1.44 mg/dL — ABNORMAL HIGH (ref 0.44–1.00)
GFR, Estimated: 35 mL/min — ABNORMAL LOW (ref >60)
Glucose, Bld: 94 mg/dL (ref 70–99)
Potassium: 3.8 mmol/L (ref 3.5–5.1)
Sodium: 143 mmol/L (ref 135–145)
Total Bilirubin: 0.4 mg/dL (ref 0.0–1.2)
Total Protein: 5.5 g/dL — ABNORMAL LOW (ref 6.5–8.1)

## 2024-02-26 LAB — CBC WITH DIFFERENTIAL (CANCER CENTER ONLY)
Abs Immature Granulocytes: 0.14 10*3/uL — ABNORMAL HIGH (ref 0.00–0.07)
Basophils Absolute: 0.1 10*3/uL (ref 0.0–0.1)
Basophils Relative: 1 %
Eosinophils Absolute: 0.1 10*3/uL (ref 0.0–0.5)
Eosinophils Relative: 1 %
HCT: 35.6 % — ABNORMAL LOW (ref 36.0–46.0)
Hemoglobin: 11.5 g/dL — ABNORMAL LOW (ref 12.0–15.0)
Immature Granulocytes: 1 %
Lymphocytes Relative: 10 %
Lymphs Abs: 1.1 10*3/uL (ref 0.7–4.0)
MCH: 30.4 pg (ref 26.0–34.0)
MCHC: 32.3 g/dL (ref 30.0–36.0)
MCV: 94.2 fL (ref 80.0–100.0)
Monocytes Absolute: 0.8 10*3/uL (ref 0.1–1.0)
Monocytes Relative: 7 %
Neutro Abs: 8.6 10*3/uL — ABNORMAL HIGH (ref 1.7–7.7)
Neutrophils Relative %: 80 %
Platelet Count: 290 10*3/uL (ref 150–400)
RBC: 3.78 MIL/uL — ABNORMAL LOW (ref 3.87–5.11)
RDW: 14.1 % (ref 11.5–15.5)
WBC Count: 10.8 10*3/uL — ABNORMAL HIGH (ref 4.0–10.5)
nRBC: 0 % (ref 0.0–0.2)

## 2024-02-26 MED ORDER — FULVESTRANT 250 MG/5ML IM SOSY
500.0000 mg | PREFILLED_SYRINGE | Freq: Once | INTRAMUSCULAR | Status: AC
  Administered 2024-02-26: 500 mg via INTRAMUSCULAR
  Filled 2024-02-26: qty 10

## 2024-02-26 NOTE — Progress Notes (Signed)
 [facility] OFFICE PROGRESS NOTE   Diagnosis: Breast cancer  INTERVAL HISTORY:   Alison.Wrenn returns as scheduled.  No back pain.  She developed blisters at the bilateral foot over the past few weeks.  The blisters have ruptured and she has multiple superficial ulcerations.  She is receiving skin care via the assisted living facility.  No other complaint.  Objective:  Vital signs in last 24 hours:  Blood pressure 138/62, pulse (!) 105, temperature 98.2 F (36.8 C), temperature source Temporal, resp. rate 18, height 5\' 6"  (1.676 m), weight 103 lb (46.7 kg), last menstrual period 10/07/1974, SpO2 100%.    Resp: Lungs clear bilaterally Cardio: Irregular GI: Nontender, no hepatosplenomegaly, no mass Vascular: Trace/1+ pitting edema at the lower leg and foot bilaterally  Skin: Few ecchymoses over the extremities, erythema with areas of skin breakdown at the dorsum of the feet, 4-5 cm area of denuded skin at the distal foot bilaterally, no ulcers at the soles I removed the large dressing from both feet.   Lab Results:  Lab Results  Component Value Date   WBC 10.8 (H) 02/26/2024   HGB 11.5 (L) 02/26/2024   HCT 35.6 (L) 02/26/2024   MCV 94.2 02/26/2024   PLT 290 02/26/2024   NEUTROABS 8.6 (H) 02/26/2024    CMP  Lab Results  Component Value Date   NA 143 02/26/2024   K 3.8 02/26/2024   CL 110 02/26/2024   CO2 20 (L) 02/26/2024   GLUCOSE 94 02/26/2024   BUN 21 02/26/2024   CREATININE 1.44 (H) 02/26/2024   CALCIUM  7.6 (L) 02/26/2024   PROT 5.5 (L) 02/26/2024   ALBUMIN  3.3 (L) 02/26/2024   AST 57 (H) 02/26/2024   ALT 68 (H) 02/26/2024   ALKPHOS 186 (H) 02/26/2024   BILITOT 0.4 02/26/2024   GFRNONAA 35 (L) 02/26/2024   GFRAA 44 (L) 06/07/2019    Lab Results  Component Value Date   CA125 12 07/15/2016    Lab Results  Component Value Date   INR 1.2 10/30/2023   LABPROT 15.1 10/30/2023    Imaging:  No results found.  Medications: I have reviewed  the patient's current medications.   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  Status post a needle localized lumpectomy and sentinel lymph node biopsy 04/13/2009 with the pathology confirming high-grade ductal carcinoma in situ with an associated 0.26 mm invasive carcinoma, ER positive, PR positive, and HER2 positive. Status post adjuvant right breast radiation completed 06/19/2009. Initiation of Arimidex  05/01/2009.  Completed 5 years in July 2015 Family history of breast cancer. History of Clostridium difficile colitis. Frequent bowel movements following the bowel resection in 2009. 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 CTs 10/16/2023-innumerable  lung nodules both lungs; multiple new ill-defined low-density liver lesions; bony metastatic disease in the thoracic and lumbar spine.  Severe compression fracture at T11 with osseous retropulsion.  Small right greater than left pleural effusions. Biopsy liver lesion 10/30/2023-metastatic  moderate to poorly differentiated adenocarcinoma consistent with breast origin; HER2 negative (1+), ER +100%, PR +30%, Ki-67 60%, ESR 1 nutation positive, PIK3CA negative Faslodex /Abemaciclib  11/07/2023, Abemaciclib  discontinued on hospital admission 11/21/2023 Faslodex  12/12/2023, 12/26/2023, 01/08/2024, 02/26/2024 Elevated platelet count-essential thrombocytosis 07/31/22- postitive [id number] 03/05/2023-hydroxyurea  500 mg Monday, Wednesday, and Friday Hydroxyurea  discontinued March 2025 CHEK2 pathogenic mutation positive Severe back pain-MRI 10/09/2023 with compression fracture at T11, abnormal signal T9, T10, T11 and T12 as well as several adjacent ribs; right greater than left pleural effusion Palliative radiation to the lower thoracic, lumbar, and upper sacrum 11/18/2023-12/03/2023 12.  Admission 11/21/2023 with acute renal failure, elevated liver enzymes, dehydration, and severe back pain Renal failure and elevated liver enzymes improved, discharged 12/04/2023 13.  Anemia/thrombocytopenia-likely secondary to Abemaciclib , metastatic breast cancer involving the bone marrow, and radiation 14.  Admission 12/05/2023 with chest pain and rapid atrial fibrillation 15.  Admission 12/23/2023 with rapid atrial fibrillation and CHF 16.  Anemia secondary to chronic disease, metastatic breast cancer involving the bone marrow, phlebotomy, and chemotherapy/radiation 17.  Lower leg/foot edema with skin breakdown 02/26/2024    Disposition: [name] has metastatic breast cancer.  She is currently being treated with single agent Fosamax.  She will receive another treatment Faslodex  today.  She no longer has back pain.  She will undergo a restaging CT evaluation prior to an office visit next month.  She has atrial fibrillation and a history of CHF.  She has lower extremity edema with a history of weeping/blisters at the bilateral foot.  The blisters have now ruptured and there is skin breakdown at the dorsum of each  foot.  I suspect the skin changes are related to edema from heart failure.  I recommend she elevate the feet and continue skin care per a skin care RN recommendations.  She was seen in the emergency room with hypokalemia on 02/17/2024.  We will add a magnesium  level to the labs from today.    Coni Deep, MD  02/26/2024  12:12 PM

## 2024-02-26 NOTE — Patient Instructions (Signed)
 Fulvestrant Injection What is this medication? FULVESTRANT (ful VES trant) treats breast cancer. It works by blocking the hormone estrogen in breast tissue, which prevents breast cancer cells from spreading or growing. This medicine may be used for other purposes; ask your health care provider or pharmacist if you have questions. COMMON BRAND NAME(S): FASLODEX What should I tell my care team before I take this medication? They need to know if you have any of these conditions: Bleeding disorder Liver disease Low blood cell levels (white cells, red cells, and platelets) An unusual or allergic reaction to fulvestrant, other medications, foods, dyes, or preservatives Pregnant or trying to get pregnant Breastfeeding How should I use this medication? This medication is injected into a muscle. It is given by your care team in a hospital or clinic setting. Talk to your care team about the use of this medication in children. Special care may be needed. Overdosage: If you think you have taken too much of this medicine contact a poison control center or emergency room at once. NOTE: This medicine is only for you. Do not share this medicine with others. What if I miss a dose? Keep appointments for follow-up doses. It is important not to miss your dose. Call your care team if you are unable to keep an appointment. What may interact with this medication? Fluoroestradiol F18 This list may not describe all possible interactions. Give your health care provider a list of all the medicines, herbs, non-prescription drugs, or dietary supplements you use. Also tell them if you smoke, drink alcohol, or use illegal drugs. Some items may interact with your medicine. What should I watch for while using this medication? Your condition will be monitored carefully while you are receiving this medication. You may need blood work done while you are taking this medication. This medication is injected into a muscle. Talk  to your care team if you also take medications that prevent or treat blood clots, such as warfarin. Blood thinners may increase the risk of bleeding or bruising in the muscle where this medication is injected. The benefits of this medication may outweigh the risks. Your care team can help you find the option that works for you. They can also help limit the risk of bleeding. Talk to your care team if you may be pregnant. Serious birth defects can occur if you take this medication during pregnancy and for 1 year after the last dose. You will need a negative pregnancy test before starting this medication. Contraception is recommended while taking this medication and for 1 year after the last dose. Your care team can help you find the option that works for you. Do not breastfeed while taking this medication and for 1 year after the last dose. This medication may cause infertility. Talk to your care team if you are concerned about your fertility. What side effects may I notice from receiving this medication? Side effects that you should report to your care team as soon as possible: Allergic reactions or angioedema--skin rash, itching or hives, swelling of the face, eyes, lips, tongue, arms, or legs, trouble swallowing or breathing Pain, tingling, or numbness in the hands or feet Side effects that usually do not require medical attention (report to your care team if they continue or are bothersome): Bone, joint, or muscle pain Constipation Headache Hot flashes Nausea Pain, redness, or irritation at injection site Unusual weakness or fatigue This list may not describe all possible side effects. Call your doctor for medical advice about side  effects. You may report side effects to FDA at 1-800-FDA-1088. Where should I keep my medication? This medication is given in a hospital or clinic. It will not be stored at home. NOTE: This sheet is a summary. It may not cover all possible information. If you have  questions about this medicine, talk to your doctor, pharmacist, or health care provider.  2024 Elsevier/Gold Standard (2023-05-30 00:00:00)

## 2024-02-26 NOTE — Progress Notes (Signed)
 Placed referral to [facility] at [facility] and called to confirm receipt. Was informed they are booked out till 2nd week of July.

## 2024-02-27 ENCOUNTER — Other Ambulatory Visit (HOSPITAL_COMMUNITY): Payer: Self-pay

## 2024-02-27 ENCOUNTER — Other Ambulatory Visit: Payer: Self-pay | Admitting: *Deleted

## 2024-02-27 ENCOUNTER — Telehealth: Payer: Self-pay | Admitting: *Deleted

## 2024-02-27 DIAGNOSIS — Z170 Estrogen receptor positive status [ER+]: Secondary | ICD-10-CM | POA: Diagnosis not present

## 2024-02-27 DIAGNOSIS — C787 Secondary malignant neoplasm of liver and intrahepatic bile duct: Secondary | ICD-10-CM | POA: Diagnosis not present

## 2024-02-27 DIAGNOSIS — E43 Unspecified severe protein-calorie malnutrition: Secondary | ICD-10-CM | POA: Diagnosis not present

## 2024-02-27 DIAGNOSIS — C7951 Secondary malignant neoplasm of bone: Secondary | ICD-10-CM | POA: Diagnosis not present

## 2024-02-27 DIAGNOSIS — I4811 Longstanding persistent atrial fibrillation: Secondary | ICD-10-CM | POA: Diagnosis not present

## 2024-02-27 DIAGNOSIS — I130 Hypertensive heart and chronic kidney disease with heart failure and stage 1 through stage 4 chronic kidney disease, or unspecified chronic kidney disease: Secondary | ICD-10-CM | POA: Diagnosis not present

## 2024-02-27 DIAGNOSIS — D696 Thrombocytopenia, unspecified: Secondary | ICD-10-CM | POA: Diagnosis not present

## 2024-02-27 DIAGNOSIS — I10 Essential (primary) hypertension: Secondary | ICD-10-CM | POA: Diagnosis not present

## 2024-02-27 DIAGNOSIS — Z5111 Encounter for antineoplastic chemotherapy: Secondary | ICD-10-CM | POA: Diagnosis not present

## 2024-02-27 DIAGNOSIS — C50412 Malignant neoplasm of upper-outer quadrant of left female breast: Secondary | ICD-10-CM

## 2024-02-27 DIAGNOSIS — Z853 Personal history of malignant neoplasm of breast: Secondary | ICD-10-CM | POA: Diagnosis not present

## 2024-02-27 DIAGNOSIS — I5033 Acute on chronic diastolic (congestive) heart failure: Secondary | ICD-10-CM | POA: Diagnosis not present

## 2024-02-27 DIAGNOSIS — E785 Hyperlipidemia, unspecified: Secondary | ICD-10-CM | POA: Diagnosis not present

## 2024-02-27 DIAGNOSIS — N1832 Chronic kidney disease, stage 3b: Secondary | ICD-10-CM | POA: Diagnosis not present

## 2024-02-27 DIAGNOSIS — D473 Essential (hemorrhagic) thrombocythemia: Secondary | ICD-10-CM | POA: Diagnosis not present

## 2024-02-27 DIAGNOSIS — Z515 Encounter for palliative care: Secondary | ICD-10-CM | POA: Diagnosis not present

## 2024-02-27 LAB — MAGNESIUM: Magnesium: 0.7 mg/dL — CL (ref 1.7–2.4)

## 2024-02-27 MED ORDER — MAGNESIUM OXIDE 400 MG PO TABS: 800.0000 mg | ORAL_TABLET | Freq: Two times a day (BID) | ORAL | 0 refills | Status: DC

## 2024-02-27 NOTE — Progress Notes (Signed)
 Verzenio  was discontinued on 12/09/23.  Disenrolling from Specialty Pharmacy services.

## 2024-02-27 NOTE — Telephone Encounter (Signed)
 CRITICAL VALUE STICKER  CRITICAL VALUE: Mg+ 0.7  RECEIVER (on-site recipient of call):Natalie Leclaire,RN  DATE & TIME NOTIFIED: 02/27/24 @ 1616  MESSENGER (representative from lab): Trudell Fury  MD NOTIFIED: Dr. Sharalyn Dasen  TIME OF NOTIFICATION: 0620  RESPONSE:  Start Magnesium  oxide 800 mg bid and have NP at facility f/u. Not related to cancer or treatment  Unable to reach nursing staff at facility and had to leave voice mail. Faxed labs to [phone] and notified patient and instructed her to ask nursing staff to check fax machine for her labs. Ilma also made aware of the appointment for wound clinic on 7/10. Told her to call weekly to see if there is a cancellation.

## 2024-02-27 NOTE — Progress Notes (Signed)
 Faxed 5/22 office note to [facility] and made them aware of wound center referral and that they are booked out to the 2nd week in July.

## 2024-03-02 ENCOUNTER — Telehealth: Payer: Self-pay | Admitting: *Deleted

## 2024-03-02 NOTE — Telephone Encounter (Signed)
 Attempted to call [name] assisted living to f/u if NP/staff are following up on her low Mg+ result that was sent on Friday? Was not able to reach patient to inquire. Called her sister, [name] and patient was with her--she said she is taking oral Mg+, but not aware of any IV Mg+ planned. Suggested she ask nurse when she returns to facility today and have them call [facility]. Informed her that a message was already left with nursing about this issue.

## 2024-03-03 ENCOUNTER — Encounter: Payer: Self-pay | Admitting: Oncology

## 2024-03-03 DIAGNOSIS — I130 Hypertensive heart and chronic kidney disease with heart failure and stage 1 through stage 4 chronic kidney disease, or unspecified chronic kidney disease: Secondary | ICD-10-CM | POA: Diagnosis not present

## 2024-03-03 DIAGNOSIS — I5033 Acute on chronic diastolic (congestive) heart failure: Secondary | ICD-10-CM | POA: Diagnosis not present

## 2024-03-03 DIAGNOSIS — N1832 Chronic kidney disease, stage 3b: Secondary | ICD-10-CM | POA: Diagnosis not present

## 2024-03-05 ENCOUNTER — Telehealth: Payer: Self-pay | Admitting: *Deleted

## 2024-03-05 DIAGNOSIS — E43 Unspecified severe protein-calorie malnutrition: Secondary | ICD-10-CM | POA: Diagnosis not present

## 2024-03-05 DIAGNOSIS — N1832 Chronic kidney disease, stage 3b: Secondary | ICD-10-CM | POA: Diagnosis not present

## 2024-03-05 DIAGNOSIS — D696 Thrombocytopenia, unspecified: Secondary | ICD-10-CM | POA: Diagnosis not present

## 2024-03-05 DIAGNOSIS — I5033 Acute on chronic diastolic (congestive) heart failure: Secondary | ICD-10-CM | POA: Diagnosis not present

## 2024-03-05 DIAGNOSIS — I130 Hypertensive heart and chronic kidney disease with heart failure and stage 1 through stage 4 chronic kidney disease, or unspecified chronic kidney disease: Secondary | ICD-10-CM | POA: Diagnosis not present

## 2024-03-05 DIAGNOSIS — I4811 Longstanding persistent atrial fibrillation: Secondary | ICD-10-CM | POA: Diagnosis not present

## 2024-03-05 NOTE — Telephone Encounter (Signed)
 Ms. Brodowski called and left message that no one at facility has arranged for her to have magnesium  infusion yet (Mg+ 0.7). Called facility and spoke with DON, Ivette Marks and explained that Dr. Scherrie Curt feels she needs IV Mg+ replacement, and this could be done at [facility] at [facility] and provider her the phone number. They need to fax orders and call to make an appointment. DON will discuss with NP at center. Called Ms. Hendler and left VM regarding above conversation with DON>

## 2024-03-08 ENCOUNTER — Telehealth: Payer: Self-pay | Admitting: Cardiology

## 2024-03-08 ENCOUNTER — Telehealth: Payer: Self-pay | Admitting: *Deleted

## 2024-03-08 DIAGNOSIS — R569 Unspecified convulsions: Secondary | ICD-10-CM | POA: Diagnosis not present

## 2024-03-08 DIAGNOSIS — I350 Nonrheumatic aortic (valve) stenosis: Secondary | ICD-10-CM | POA: Diagnosis not present

## 2024-03-08 DIAGNOSIS — M549 Dorsalgia, unspecified: Secondary | ICD-10-CM | POA: Diagnosis not present

## 2024-03-08 DIAGNOSIS — E785 Hyperlipidemia, unspecified: Secondary | ICD-10-CM | POA: Diagnosis not present

## 2024-03-08 DIAGNOSIS — M19012 Primary osteoarthritis, left shoulder: Secondary | ICD-10-CM | POA: Diagnosis not present

## 2024-03-08 DIAGNOSIS — C7951 Secondary malignant neoplasm of bone: Secondary | ICD-10-CM | POA: Diagnosis not present

## 2024-03-08 DIAGNOSIS — K219 Gastro-esophageal reflux disease without esophagitis: Secondary | ICD-10-CM | POA: Diagnosis not present

## 2024-03-08 DIAGNOSIS — Z85828 Personal history of other malignant neoplasm of skin: Secondary | ICD-10-CM | POA: Diagnosis not present

## 2024-03-08 DIAGNOSIS — Z556 Problems related to health literacy: Secondary | ICD-10-CM | POA: Diagnosis not present

## 2024-03-08 DIAGNOSIS — C50919 Malignant neoplasm of unspecified site of unspecified female breast: Secondary | ICD-10-CM | POA: Diagnosis not present

## 2024-03-08 DIAGNOSIS — E43 Unspecified severe protein-calorie malnutrition: Secondary | ICD-10-CM | POA: Diagnosis not present

## 2024-03-08 DIAGNOSIS — R1310 Dysphagia, unspecified: Secondary | ICD-10-CM | POA: Diagnosis not present

## 2024-03-08 DIAGNOSIS — Z6820 Body mass index (BMI) 20.0-20.9, adult: Secondary | ICD-10-CM | POA: Diagnosis not present

## 2024-03-08 DIAGNOSIS — I130 Hypertensive heart and chronic kidney disease with heart failure and stage 1 through stage 4 chronic kidney disease, or unspecified chronic kidney disease: Secondary | ICD-10-CM | POA: Diagnosis not present

## 2024-03-08 DIAGNOSIS — I5033 Acute on chronic diastolic (congestive) heart failure: Secondary | ICD-10-CM | POA: Diagnosis not present

## 2024-03-08 DIAGNOSIS — I4811 Longstanding persistent atrial fibrillation: Secondary | ICD-10-CM | POA: Diagnosis not present

## 2024-03-08 DIAGNOSIS — I2510 Atherosclerotic heart disease of native coronary artery without angina pectoris: Secondary | ICD-10-CM | POA: Diagnosis not present

## 2024-03-08 DIAGNOSIS — R32 Unspecified urinary incontinence: Secondary | ICD-10-CM | POA: Diagnosis not present

## 2024-03-08 DIAGNOSIS — D631 Anemia in chronic kidney disease: Secondary | ICD-10-CM | POA: Diagnosis not present

## 2024-03-08 DIAGNOSIS — I10 Essential (primary) hypertension: Secondary | ICD-10-CM | POA: Diagnosis not present

## 2024-03-08 DIAGNOSIS — N1832 Chronic kidney disease, stage 3b: Secondary | ICD-10-CM | POA: Diagnosis not present

## 2024-03-08 DIAGNOSIS — I422 Other hypertrophic cardiomyopathy: Secondary | ICD-10-CM | POA: Diagnosis not present

## 2024-03-08 DIAGNOSIS — Z7982 Long term (current) use of aspirin: Secondary | ICD-10-CM | POA: Diagnosis not present

## 2024-03-08 DIAGNOSIS — D696 Thrombocytopenia, unspecified: Secondary | ICD-10-CM | POA: Diagnosis not present

## 2024-03-08 DIAGNOSIS — M19011 Primary osteoarthritis, right shoulder: Secondary | ICD-10-CM | POA: Diagnosis not present

## 2024-03-08 NOTE — Telephone Encounter (Signed)
 Spoke with [name]. Told Jim Dr Renna Cary recommendation of pausing exercise if HR over 150. [name] stated understanding.

## 2024-03-08 NOTE — Telephone Encounter (Signed)
 Follow Up:     [name] said he had called on 02-11-24 about patient's heart rate. He said he had not heard back about what was decided.

## 2024-03-08 NOTE — Telephone Encounter (Signed)
 Attempted to call [facility] re: CT scan and f/u visit. No answer. Sent MyChart message.

## 2024-03-09 DIAGNOSIS — I5033 Acute on chronic diastolic (congestive) heart failure: Secondary | ICD-10-CM | POA: Diagnosis not present

## 2024-03-09 DIAGNOSIS — D696 Thrombocytopenia, unspecified: Secondary | ICD-10-CM | POA: Diagnosis not present

## 2024-03-09 DIAGNOSIS — I4811 Longstanding persistent atrial fibrillation: Secondary | ICD-10-CM | POA: Diagnosis not present

## 2024-03-09 DIAGNOSIS — E43 Unspecified severe protein-calorie malnutrition: Secondary | ICD-10-CM | POA: Diagnosis not present

## 2024-03-09 DIAGNOSIS — N1832 Chronic kidney disease, stage 3b: Secondary | ICD-10-CM | POA: Diagnosis not present

## 2024-03-09 DIAGNOSIS — I130 Hypertensive heart and chronic kidney disease with heart failure and stage 1 through stage 4 chronic kidney disease, or unspecified chronic kidney disease: Secondary | ICD-10-CM | POA: Diagnosis not present

## 2024-03-10 DIAGNOSIS — D696 Thrombocytopenia, unspecified: Secondary | ICD-10-CM | POA: Diagnosis not present

## 2024-03-10 DIAGNOSIS — I4811 Longstanding persistent atrial fibrillation: Secondary | ICD-10-CM | POA: Diagnosis not present

## 2024-03-10 DIAGNOSIS — N1832 Chronic kidney disease, stage 3b: Secondary | ICD-10-CM | POA: Diagnosis not present

## 2024-03-10 DIAGNOSIS — I130 Hypertensive heart and chronic kidney disease with heart failure and stage 1 through stage 4 chronic kidney disease, or unspecified chronic kidney disease: Secondary | ICD-10-CM | POA: Diagnosis not present

## 2024-03-10 DIAGNOSIS — I5033 Acute on chronic diastolic (congestive) heart failure: Secondary | ICD-10-CM | POA: Diagnosis not present

## 2024-03-10 DIAGNOSIS — E43 Unspecified severe protein-calorie malnutrition: Secondary | ICD-10-CM | POA: Diagnosis not present

## 2024-03-11 ENCOUNTER — Telehealth: Payer: Self-pay | Admitting: Nurse Practitioner

## 2024-03-11 ENCOUNTER — Other Ambulatory Visit: Payer: Self-pay

## 2024-03-11 NOTE — Telephone Encounter (Signed)
 Calling PT with updated appt information, voicemail not set up.

## 2024-03-12 DIAGNOSIS — E43 Unspecified severe protein-calorie malnutrition: Secondary | ICD-10-CM | POA: Diagnosis not present

## 2024-03-12 DIAGNOSIS — N1832 Chronic kidney disease, stage 3b: Secondary | ICD-10-CM | POA: Diagnosis not present

## 2024-03-12 DIAGNOSIS — I130 Hypertensive heart and chronic kidney disease with heart failure and stage 1 through stage 4 chronic kidney disease, or unspecified chronic kidney disease: Secondary | ICD-10-CM | POA: Diagnosis not present

## 2024-03-12 DIAGNOSIS — I4811 Longstanding persistent atrial fibrillation: Secondary | ICD-10-CM | POA: Diagnosis not present

## 2024-03-12 DIAGNOSIS — I5033 Acute on chronic diastolic (congestive) heart failure: Secondary | ICD-10-CM | POA: Diagnosis not present

## 2024-03-12 DIAGNOSIS — D696 Thrombocytopenia, unspecified: Secondary | ICD-10-CM | POA: Diagnosis not present

## 2024-03-15 DIAGNOSIS — Z515 Encounter for palliative care: Secondary | ICD-10-CM | POA: Diagnosis not present

## 2024-03-15 NOTE — Patient Outreach (Signed)
 Post- Acute Care Manager follow up.   Verified with [facility] social worker, [name] discharged from [facility] on 02/04/24. Transitioned to [facility].   No identifiable complex care management needs.   Alison Batty, MSN, RN, BSN [place]  [facility] RN Post- Acute Care Manager Direct Dial: [phone]

## 2024-03-17 DIAGNOSIS — I5033 Acute on chronic diastolic (congestive) heart failure: Secondary | ICD-10-CM | POA: Diagnosis not present

## 2024-03-17 DIAGNOSIS — I130 Hypertensive heart and chronic kidney disease with heart failure and stage 1 through stage 4 chronic kidney disease, or unspecified chronic kidney disease: Secondary | ICD-10-CM | POA: Diagnosis not present

## 2024-03-17 DIAGNOSIS — I4811 Longstanding persistent atrial fibrillation: Secondary | ICD-10-CM | POA: Diagnosis not present

## 2024-03-17 DIAGNOSIS — D696 Thrombocytopenia, unspecified: Secondary | ICD-10-CM | POA: Diagnosis not present

## 2024-03-17 DIAGNOSIS — E43 Unspecified severe protein-calorie malnutrition: Secondary | ICD-10-CM | POA: Diagnosis not present

## 2024-03-17 DIAGNOSIS — N1832 Chronic kidney disease, stage 3b: Secondary | ICD-10-CM | POA: Diagnosis not present

## 2024-03-18 DIAGNOSIS — E43 Unspecified severe protein-calorie malnutrition: Secondary | ICD-10-CM | POA: Diagnosis not present

## 2024-03-18 DIAGNOSIS — I4811 Longstanding persistent atrial fibrillation: Secondary | ICD-10-CM | POA: Diagnosis not present

## 2024-03-18 DIAGNOSIS — N1832 Chronic kidney disease, stage 3b: Secondary | ICD-10-CM | POA: Diagnosis not present

## 2024-03-18 DIAGNOSIS — I130 Hypertensive heart and chronic kidney disease with heart failure and stage 1 through stage 4 chronic kidney disease, or unspecified chronic kidney disease: Secondary | ICD-10-CM | POA: Diagnosis not present

## 2024-03-18 DIAGNOSIS — I5033 Acute on chronic diastolic (congestive) heart failure: Secondary | ICD-10-CM | POA: Diagnosis not present

## 2024-03-18 DIAGNOSIS — D696 Thrombocytopenia, unspecified: Secondary | ICD-10-CM | POA: Diagnosis not present

## 2024-03-22 DIAGNOSIS — D696 Thrombocytopenia, unspecified: Secondary | ICD-10-CM | POA: Diagnosis not present

## 2024-03-22 DIAGNOSIS — N1832 Chronic kidney disease, stage 3b: Secondary | ICD-10-CM | POA: Diagnosis not present

## 2024-03-22 DIAGNOSIS — I5033 Acute on chronic diastolic (congestive) heart failure: Secondary | ICD-10-CM | POA: Diagnosis not present

## 2024-03-22 DIAGNOSIS — I4811 Longstanding persistent atrial fibrillation: Secondary | ICD-10-CM | POA: Diagnosis not present

## 2024-03-22 DIAGNOSIS — I130 Hypertensive heart and chronic kidney disease with heart failure and stage 1 through stage 4 chronic kidney disease, or unspecified chronic kidney disease: Secondary | ICD-10-CM | POA: Diagnosis not present

## 2024-03-22 DIAGNOSIS — E43 Unspecified severe protein-calorie malnutrition: Secondary | ICD-10-CM | POA: Diagnosis not present

## 2024-03-23 DIAGNOSIS — I5033 Acute on chronic diastolic (congestive) heart failure: Secondary | ICD-10-CM | POA: Diagnosis not present

## 2024-03-23 DIAGNOSIS — I130 Hypertensive heart and chronic kidney disease with heart failure and stage 1 through stage 4 chronic kidney disease, or unspecified chronic kidney disease: Secondary | ICD-10-CM | POA: Diagnosis not present

## 2024-03-23 DIAGNOSIS — E43 Unspecified severe protein-calorie malnutrition: Secondary | ICD-10-CM | POA: Diagnosis not present

## 2024-03-23 DIAGNOSIS — D696 Thrombocytopenia, unspecified: Secondary | ICD-10-CM | POA: Diagnosis not present

## 2024-03-23 DIAGNOSIS — I4811 Longstanding persistent atrial fibrillation: Secondary | ICD-10-CM | POA: Diagnosis not present

## 2024-03-23 DIAGNOSIS — N1832 Chronic kidney disease, stage 3b: Secondary | ICD-10-CM | POA: Diagnosis not present

## 2024-03-24 DIAGNOSIS — N1832 Chronic kidney disease, stage 3b: Secondary | ICD-10-CM | POA: Diagnosis not present

## 2024-03-24 DIAGNOSIS — I4811 Longstanding persistent atrial fibrillation: Secondary | ICD-10-CM | POA: Diagnosis not present

## 2024-03-24 DIAGNOSIS — D696 Thrombocytopenia, unspecified: Secondary | ICD-10-CM | POA: Diagnosis not present

## 2024-03-24 DIAGNOSIS — E43 Unspecified severe protein-calorie malnutrition: Secondary | ICD-10-CM | POA: Diagnosis not present

## 2024-03-24 DIAGNOSIS — I5033 Acute on chronic diastolic (congestive) heart failure: Secondary | ICD-10-CM | POA: Diagnosis not present

## 2024-03-24 DIAGNOSIS — I130 Hypertensive heart and chronic kidney disease with heart failure and stage 1 through stage 4 chronic kidney disease, or unspecified chronic kidney disease: Secondary | ICD-10-CM | POA: Diagnosis not present

## 2024-03-25 ENCOUNTER — Ambulatory Visit: Admitting: Nurse Practitioner

## 2024-03-25 ENCOUNTER — Other Ambulatory Visit

## 2024-03-25 ENCOUNTER — Ambulatory Visit (HOSPITAL_BASED_OUTPATIENT_CLINIC_OR_DEPARTMENT_OTHER)
Admission: RE | Admit: 2024-03-25 | Discharge: 2024-03-25 | Disposition: A | Discharge status: Home / Self Care | Source: Ambulatory Visit | Attending: Oncology | Admitting: Oncology

## 2024-03-25 DIAGNOSIS — C50412 Malignant neoplasm of upper-outer quadrant of left female breast: Secondary | ICD-10-CM | POA: Diagnosis not present

## 2024-03-25 DIAGNOSIS — C7951 Secondary malignant neoplasm of bone: Secondary | ICD-10-CM | POA: Diagnosis not present

## 2024-03-25 DIAGNOSIS — Z170 Estrogen receptor positive status [ER+]: Secondary | ICD-10-CM | POA: Insufficient documentation

## 2024-03-25 DIAGNOSIS — C787 Secondary malignant neoplasm of liver and intrahepatic bile duct: Secondary | ICD-10-CM | POA: Diagnosis not present

## 2024-03-26 ENCOUNTER — Encounter: Payer: Self-pay | Admitting: Nurse Practitioner

## 2024-03-26 ENCOUNTER — Inpatient Hospital Stay

## 2024-03-26 ENCOUNTER — Other Ambulatory Visit: Payer: Self-pay

## 2024-03-26 ENCOUNTER — Inpatient Hospital Stay: Admitting: Nurse Practitioner

## 2024-03-26 ENCOUNTER — Inpatient Hospital Stay: Attending: Oncology

## 2024-03-26 VITALS — BP 140/61 | HR 100 | Temp 98.1°F | Resp 16 | Ht 66.0 in | Wt 107.3 lb

## 2024-03-26 DIAGNOSIS — C50919 Malignant neoplasm of unspecified site of unspecified female breast: Secondary | ICD-10-CM | POA: Diagnosis not present

## 2024-03-26 DIAGNOSIS — Z170 Estrogen receptor positive status [ER+]: Secondary | ICD-10-CM

## 2024-03-26 DIAGNOSIS — Z5111 Encounter for antineoplastic chemotherapy: Secondary | ICD-10-CM | POA: Diagnosis not present

## 2024-03-26 DIAGNOSIS — C7801 Secondary malignant neoplasm of right lung: Secondary | ICD-10-CM | POA: Diagnosis not present

## 2024-03-26 DIAGNOSIS — C7951 Secondary malignant neoplasm of bone: Secondary | ICD-10-CM | POA: Diagnosis not present

## 2024-03-26 DIAGNOSIS — C787 Secondary malignant neoplasm of liver and intrahepatic bile duct: Secondary | ICD-10-CM | POA: Diagnosis not present

## 2024-03-26 DIAGNOSIS — C50412 Malignant neoplasm of upper-outer quadrant of left female breast: Secondary | ICD-10-CM

## 2024-03-26 DIAGNOSIS — Z853 Personal history of malignant neoplasm of breast: Secondary | ICD-10-CM | POA: Diagnosis not present

## 2024-03-26 DIAGNOSIS — D473 Essential (hemorrhagic) thrombocythemia: Secondary | ICD-10-CM | POA: Insufficient documentation

## 2024-03-26 DIAGNOSIS — C7802 Secondary malignant neoplasm of left lung: Secondary | ICD-10-CM | POA: Insufficient documentation

## 2024-03-26 LAB — CMP (CANCER CENTER ONLY)
ALT: 32 U/L (ref 0–44)
AST: 29 U/L (ref 15–41)
Albumin: 3.6 g/dL (ref 3.5–5.0)
Alkaline Phosphatase: 140 U/L — ABNORMAL HIGH (ref 38–126)
Anion gap: 13 (ref 5–15)
BUN: 29 mg/dL — ABNORMAL HIGH (ref 8–23)
CO2: 22 mmol/L (ref 22–32)
Calcium: 9.2 mg/dL (ref 8.9–10.3)
Chloride: 107 mmol/L (ref 98–111)
Creatinine: 1.71 mg/dL — ABNORMAL HIGH (ref 0.44–1.00)
GFR, Estimated: 29 mL/min — ABNORMAL LOW (ref >60)
Glucose, Bld: 125 mg/dL — ABNORMAL HIGH (ref 70–99)
Potassium: 4.2 mmol/L (ref 3.5–5.1)
Sodium: 143 mmol/L (ref 135–145)
Total Bilirubin: 0.4 mg/dL (ref 0.0–1.2)
Total Protein: 6.3 g/dL — ABNORMAL LOW (ref 6.5–8.1)

## 2024-03-26 LAB — CBC WITH DIFFERENTIAL (CANCER CENTER ONLY)
Abs Immature Granulocytes: 0.1 10*3/uL — ABNORMAL HIGH (ref 0.00–0.07)
Basophils Absolute: 0.1 10*3/uL (ref 0.0–0.1)
Basophils Relative: 1 %
Eosinophils Absolute: 0.1 10*3/uL (ref 0.0–0.5)
Eosinophils Relative: 1 %
HCT: 34.5 % — ABNORMAL LOW (ref 36.0–46.0)
Hemoglobin: 11.1 g/dL — ABNORMAL LOW (ref 12.0–15.0)
Immature Granulocytes: 1 %
Lymphocytes Relative: 10 %
Lymphs Abs: 1 10*3/uL (ref 0.7–4.0)
MCH: 29.3 pg (ref 26.0–34.0)
MCHC: 32.2 g/dL (ref 30.0–36.0)
MCV: 91 fL (ref 80.0–100.0)
Monocytes Absolute: 0.9 10*3/uL (ref 0.1–1.0)
Monocytes Relative: 9 %
Neutro Abs: 7.8 10*3/uL — ABNORMAL HIGH (ref 1.7–7.7)
Neutrophils Relative %: 78 %
Platelet Count: 295 10*3/uL (ref 150–400)
RBC: 3.79 MIL/uL — ABNORMAL LOW (ref 3.87–5.11)
RDW: 15 % (ref 11.5–15.5)
WBC Count: 10 10*3/uL (ref 4.0–10.5)
nRBC: 0 % (ref 0.0–0.2)

## 2024-03-26 LAB — MAGNESIUM: Magnesium: 1.2 mg/dL — ABNORMAL LOW (ref 1.7–2.4)

## 2024-03-26 MED ORDER — OXYCODONE HCL 5 MG PO TABS: 5.0000 mg | ORAL_TABLET | Freq: Four times a day (QID) | ORAL | 0 refills | Status: DC | PRN

## 2024-03-26 MED ORDER — FULVESTRANT 250 MG/5ML IM SOSY
500.0000 mg | PREFILLED_SYRINGE | Freq: Once | INTRAMUSCULAR | Status: AC
  Administered 2024-03-26: 500 mg via INTRAMUSCULAR
  Filled 2024-03-26: qty 10

## 2024-03-26 NOTE — Patient Instructions (Signed)
 Fulvestrant Injection What is this medication? FULVESTRANT (ful VES trant) treats breast cancer. It works by blocking the hormone estrogen in breast tissue, which prevents breast cancer cells from spreading or growing. This medicine may be used for other purposes; ask your health care provider or pharmacist if you have questions. COMMON BRAND NAME(S): FASLODEX What should I tell my care team before I take this medication? They need to know if you have any of these conditions: Bleeding disorder Liver disease Low blood cell levels (white cells, red cells, and platelets) An unusual or allergic reaction to fulvestrant, other medications, foods, dyes, or preservatives Pregnant or trying to get pregnant Breastfeeding How should I use this medication? This medication is injected into a muscle. It is given by your care team in a hospital or clinic setting. Talk to your care team about the use of this medication in children. Special care may be needed. Overdosage: If you think you have taken too much of this medicine contact a poison control center or emergency room at once. NOTE: This medicine is only for you. Do not share this medicine with others. What if I miss a dose? Keep appointments for follow-up doses. It is important not to miss your dose. Call your care team if you are unable to keep an appointment. What may interact with this medication? Fluoroestradiol F18 This list may not describe all possible interactions. Give your health care provider a list of all the medicines, herbs, non-prescription drugs, or dietary supplements you use. Also tell them if you smoke, drink alcohol, or use illegal drugs. Some items may interact with your medicine. What should I watch for while using this medication? Your condition will be monitored carefully while you are receiving this medication. You may need blood work done while you are taking this medication. This medication is injected into a muscle. Talk  to your care team if you also take medications that prevent or treat blood clots, such as warfarin. Blood thinners may increase the risk of bleeding or bruising in the muscle where this medication is injected. The benefits of this medication may outweigh the risks. Your care team can help you find the option that works for you. They can also help limit the risk of bleeding. Talk to your care team if you may be pregnant. Serious birth defects can occur if you take this medication during pregnancy and for 1 year after the last dose. You will need a negative pregnancy test before starting this medication. Contraception is recommended while taking this medication and for 1 year after the last dose. Your care team can help you find the option that works for you. Do not breastfeed while taking this medication and for 1 year after the last dose. This medication may cause infertility. Talk to your care team if you are concerned about your fertility. What side effects may I notice from receiving this medication? Side effects that you should report to your care team as soon as possible: Allergic reactions or angioedema--skin rash, itching or hives, swelling of the face, eyes, lips, tongue, arms, or legs, trouble swallowing or breathing Pain, tingling, or numbness in the hands or feet Side effects that usually do not require medical attention (report to your care team if they continue or are bothersome): Bone, joint, or muscle pain Constipation Headache Hot flashes Nausea Pain, redness, or irritation at injection site Unusual weakness or fatigue This list may not describe all possible side effects. Call your doctor for medical advice about side  effects. You may report side effects to FDA at 1-800-FDA-1088. Where should I keep my medication? This medication is given in a hospital or clinic. It will not be stored at home. NOTE: This sheet is a summary. It may not cover all possible information. If you have  questions about this medicine, talk to your doctor, pharmacist, or health care provider.  2024 Elsevier/Gold Standard (2023-05-30 00:00:00)

## 2024-03-26 NOTE — Progress Notes (Signed)
 [facility] OFFICE PROGRESS NOTE   Diagnosis: Breast cancer  INTERVAL HISTORY:   [name] returns as scheduled.  She continues Faslodex .  She has intermittent back pain, takes oxycodone  as needed.  She estimates taking oxycodone  2 times over the past week.  Bowels moving regularly.  Objective:  Vital signs in last 24 hours:  Blood pressure (!) 140/61, pulse 100, temperature 98.1 F (36.7 C), temperature source Temporal, resp. rate 16, height 5' 6 (1.676 m), weight 107 lb 4.8 oz (48.7 kg), last menstrual period 10/07/1974, SpO2 100%.    HEENT: No thrush or ulcers. Resp: Lungs clear, breath sounds diminished at the bases.  No respiratory distress. Cardio: Irregular. GI: No hepatosplenomegaly. Vascular: Trace/1+ pitting edema lower leg and foot bilaterally. Neuro: Alert and oriented. Skin: Faint erythema at the lower leg and foot bilaterally.  Small scabbed area dorsal aspect right foot. Breast: Approximately 1 cm subcutaneous nodule very upper outer quadrant of the left breast.   Lab Results:  Lab Results  Component Value Date   WBC 10.8 (H) 02/26/2024   HGB 11.5 (L) 02/26/2024   HCT 35.6 (L) 02/26/2024   MCV 94.2 02/26/2024   PLT 290 02/26/2024   NEUTROABS 8.6 (H) 02/26/2024    Imaging:  CT CHEST ABDOMEN PELVIS WO CONTRAST Result Date: 03/26/2024 CLINICAL DATA:  Metastatic breast cancer. Hysterectomy. Appendectomy. Kidney stones. * Tracking Code: BO * EXAM: CT CHEST, ABDOMEN AND PELVIS WITHOUT CONTRAST TECHNIQUE: Multidetector CT imaging of the chest, abdomen and pelvis was performed following the standard protocol without IV contrast. RADIATION DOSE REDUCTION: This exam was performed according to the departmental dose-optimization program which includes automated exposure control, adjustment of the mA and/or kV according to patient size and/or use of iterative reconstruction technique. COMPARISON:  11/23/2023 abdominopelvic CT. Most recent chest CT  10/16/2023. FINDINGS: CT CHEST FINDINGS Cardiovascular: Aortic atherosclerosis. Mild cardiomegaly, without pericardial effusion. Left main and 3 vessel coronary artery calcification. Mediastinum/Nodes: No supraclavicular adenopathy. No axillary adenopathy. No mediastinal or hilar adenopathy, given limitations of unenhanced CT. No internal mammary adenopathy. Lungs/Pleura: Small, right larger than left pleural effusions are minimally increased from the prior chest CT. Right greater than left base atelectasis. Significant decrease in size and number of diffuse millimetric pulmonary nodules/metastasis. An index right lower lobe 4 mm nodule on 98/3 measures 5 mm on 10/16/2023. Index left lower lobe 3 mm nodule on 86/3 measured 4 mm on the prior (when remeasured). Left upper lobe 3 mm nodule on 53/3 measured 4-5 mm on the prior. Musculoskeletal: Right breast surgical changes. A superior left breast or chest wall nodule measures 11 x 11 mm on 31/2 and is new since 10/16/2023. Osteopenia. Old right rib fractures. T11 severe compression deformity with ventral canal encroachment again identified. Interval healing of previously lytic vertebral body metastasis, including at T12, T10, T8, T9. CT ABDOMEN PELVIS FINDINGS Hepatobiliary: Given limitation of noncontrast technique, marked improvement in appearance of hepatic metastasis since 11/23/2023. Example posterior right hepatic lobe 1.4 cm lesion at the site of a 4.4 x 3.1 cm lesion on that exam. Cholecystectomy, without biliary ductal dilatation. Pancreas: Normal for age.  Atrophy but no duct dilatation. Spleen: Normal in size, without focal abnormality. Adrenals/Urinary Tract: A right adrenal nodule measures 1.5 cm and 27 HU. Normal left adrenal gland. Bilateral renal collecting system calculi including at up to 5 mm inter pole left kidney. Bilateral renal lesions which are primarily low-density and likely represent cysts. An interpolar right renal 8 mm lesion is  hyperattenuating and most likely a complex cyst. Of doubtful clinical significance given age comorbidities. The right-sided hydronephrosis has resolved.  No bladder calculi. Stomach/Bowel: Proximal gastric underdistention. Partial colectomy. Normal small bowel. Vascular/Lymphatic: Advanced aortic and branch vessel atherosclerosis. No abdominopelvic adenopathy. Reproductive: Hysterectomy.  No adnexal mass. Other: No significant free fluid. No evidence of omental or peritoneal disease. Musculoskeletal: Osteopenia. Grade 1 L5-S1 anterolisthesis. Interval healing of L4 spinous process metastasis with probable nondisplaced pathologic fracture including on 55/5. IMPRESSION: 1. Moderate response to the therapy of pulmonary, hepatic, and osseous metastasis, as detailed above. 2. Superior left breast or left chest wall nodule is new since 10/16/2023. Cannot exclude new site of metastasis or metachronous breast primary. 3. Mild increase in small bilateral pleural effusions. 4. Right adrenal nodule which given ongoing stability is favored to represent a benign adenoma. 5. Resolved right-sided hydronephrosis. 6. Bilateral nephrolithiasis 7. Incidental findings, including: Coronary artery atherosclerosis. Aortic Atherosclerosis (ICD10-I70.0). Electronically Signed   By: Lore Rode M.D.   On: 03/26/2024 11:29    Medications: I have reviewed the patient's current medications.  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 Remote hysterectomy and bilateral salpingo-oophorectomy with the cytology from 1976 confirming numerous psammoma bodies. 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  Status post  a needle localized lumpectomy and sentinel lymph node biopsy 04/13/2009 with the pathology confirming high-grade ductal carcinoma in situ with an associated 0.26 mm invasive carcinoma, ER positive, PR positive, and HER2 positive. Status post adjuvant right breast radiation completed 06/19/2009. Initiation of Arimidex  05/01/2009.  Completed 5 years in July 2015 Family history of breast cancer. History of Clostridium difficile colitis. Frequent bowel movements following the bowel resection in 2009. 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 CTs 10/16/2023-innumerable lung nodules both lungs; multiple new ill-defined low-density liver lesions; bony metastatic disease in the thoracic and lumbar spine.  Severe compression fracture at T11 with osseous retropulsion.  Small right greater than left pleural effusions. Biopsy liver lesion 10/30/2023-metastatic moderate to poorly differentiated adenocarcinoma consistent with breast origin; HER2 negative (1+), ER +100%, PR +30%, Ki-67 60%, ESR 1 nutation positive, PIK3CA negative Faslodex /Abemaciclib  11/07/2023, Abemaciclib  discontinued on hospital admission 11/21/2023 Faslodex  12/12/2023, 12/26/2023, 01/08/2024, 02/26/2024 CT 03/25/2024-significant decrease in size and number of diffuse millimetric pulmonary nodules/metastasis.  Marked improvement in appearance of hepatic metastasis.  Superior left breast or left chest wall nodule new since 10/16/2023. Faslodex  continued Elevated platelet count-essential  thrombocytosis 07/31/22- postitive [id number] 03/05/2023-hydroxyurea  500 mg Monday, Wednesday, and Friday Hydroxyurea  discontinued March 2025 CHEK2 pathogenic mutation positive Severe back pain-MRI 10/09/2023 with compression fracture at T11, abnormal signal T9, T10, T11 and T12 as well as several adjacent ribs; right greater than left pleural effusion Palliative radiation to the lower thoracic, lumbar, and upper sacrum 11/18/2023-12/03/2023 12.  Admission 11/21/2023 with acute renal failure, elevated liver enzymes, dehydration, and severe back  pain Renal failure and elevated liver enzymes improved, discharged 12/04/2023 13.  Anemia/thrombocytopenia-likely secondary to Abemaciclib , metastatic breast cancer involving the bone marrow, and radiation 14.  Admission 12/05/2023 with chest pain and rapid atrial fibrillation 15.  Admission 12/23/2023 with rapid atrial fibrillation and CHF 16.  Anemia secondary to chronic disease, metastatic breast cancer involving the bone marrow, phlebotomy, and chemotherapy/radiation 17.  Lower leg/foot edema with skin breakdown 02/26/2024-improved 03/26/2024 18.  Hypomagnesemia-oral magnesium  initiated 02/27/2024    Disposition: Alison. Lett appears stable.  She has metastatic breast cancer.  She is currently being treated with single agent Faslodex .  Recent restaging CTs show improvement in the lungs, liver and bone.  Results/images reviewed with Alison and [name] at today's visit.  They agree with the recommendation to continue Faslodex .  She will receive an injection today.  CBC and chemistry panel reviewed.  Creatinine is higher than recent baseline.  We will repeat a basic metabolic panel next week.  If still elevated we will forward to cardiology to consider adjustment of Lasix  dose.  Magnesium  remains low.  She will return for IV magnesium  next week.  She will return for follow-up and Faslodex  in 1 month.  We are available to see Alison sooner if needed.  Patient  seen with Dr. Scherrie Curt.  [name] ANP/GNP-BC   03/26/2024  1:34 PM  This was a shared visit with [name].  [name] interviewed and examined.  We reviewed the restaging CT findings and images with [name] and [name].  Alison performance status has improved significantly over the past few months.  The restaging CTs are consistent with a response to therapy with a decrease in lung/liver lesions and healing of bone lesions.  The etiology of the new chest wall nodule is unclear.  The plan is to monitor this for now.  The plan is to continue Faslodex .  The elevated creatinine and low magnesium  may be related to Alison medical regimen.  We will replete the magnesium  and repeat a chemistry panel next week.  I was present for greater than 50% of today's visit.  I performed Medical Decision Making.  Anise Kerns, MD

## 2024-03-27 LAB — CANCER ANTIGEN 27.29: CA 27.29: 20.1 U/mL (ref 0.0–38.6)

## 2024-03-29 ENCOUNTER — Encounter (HOSPITAL_BASED_OUTPATIENT_CLINIC_OR_DEPARTMENT_OTHER): Attending: Internal Medicine | Admitting: Internal Medicine

## 2024-03-29 DIAGNOSIS — C7981 Secondary malignant neoplasm of breast: Secondary | ICD-10-CM | POA: Diagnosis not present

## 2024-03-29 DIAGNOSIS — I87303 Chronic venous hypertension (idiopathic) without complications of bilateral lower extremity: Secondary | ICD-10-CM | POA: Diagnosis not present

## 2024-03-29 DIAGNOSIS — L97522 Non-pressure chronic ulcer of other part of left foot with fat layer exposed: Secondary | ICD-10-CM | POA: Diagnosis not present

## 2024-03-29 DIAGNOSIS — I5032 Chronic diastolic (congestive) heart failure: Secondary | ICD-10-CM | POA: Diagnosis not present

## 2024-03-29 DIAGNOSIS — L97512 Non-pressure chronic ulcer of other part of right foot with fat layer exposed: Secondary | ICD-10-CM | POA: Insufficient documentation

## 2024-03-30 DIAGNOSIS — I4811 Longstanding persistent atrial fibrillation: Secondary | ICD-10-CM | POA: Diagnosis not present

## 2024-03-30 DIAGNOSIS — E43 Unspecified severe protein-calorie malnutrition: Secondary | ICD-10-CM | POA: Diagnosis not present

## 2024-03-30 DIAGNOSIS — N1832 Chronic kidney disease, stage 3b: Secondary | ICD-10-CM | POA: Diagnosis not present

## 2024-03-30 DIAGNOSIS — I5033 Acute on chronic diastolic (congestive) heart failure: Secondary | ICD-10-CM | POA: Diagnosis not present

## 2024-03-30 DIAGNOSIS — D696 Thrombocytopenia, unspecified: Secondary | ICD-10-CM | POA: Diagnosis not present

## 2024-03-30 DIAGNOSIS — I130 Hypertensive heart and chronic kidney disease with heart failure and stage 1 through stage 4 chronic kidney disease, or unspecified chronic kidney disease: Secondary | ICD-10-CM | POA: Diagnosis not present

## 2024-03-31 DIAGNOSIS — E785 Hyperlipidemia, unspecified: Secondary | ICD-10-CM | POA: Diagnosis not present

## 2024-03-31 DIAGNOSIS — I5033 Acute on chronic diastolic (congestive) heart failure: Secondary | ICD-10-CM | POA: Diagnosis not present

## 2024-04-01 DIAGNOSIS — I5033 Acute on chronic diastolic (congestive) heart failure: Secondary | ICD-10-CM | POA: Diagnosis not present

## 2024-04-01 DIAGNOSIS — I130 Hypertensive heart and chronic kidney disease with heart failure and stage 1 through stage 4 chronic kidney disease, or unspecified chronic kidney disease: Secondary | ICD-10-CM | POA: Diagnosis not present

## 2024-04-01 DIAGNOSIS — I4811 Longstanding persistent atrial fibrillation: Secondary | ICD-10-CM | POA: Diagnosis not present

## 2024-04-01 DIAGNOSIS — E785 Hyperlipidemia, unspecified: Secondary | ICD-10-CM | POA: Diagnosis not present

## 2024-04-01 DIAGNOSIS — E43 Unspecified severe protein-calorie malnutrition: Secondary | ICD-10-CM | POA: Diagnosis not present

## 2024-04-01 DIAGNOSIS — K219 Gastro-esophageal reflux disease without esophagitis: Secondary | ICD-10-CM | POA: Diagnosis not present

## 2024-04-01 DIAGNOSIS — I10 Essential (primary) hypertension: Secondary | ICD-10-CM | POA: Diagnosis not present

## 2024-04-01 DIAGNOSIS — I509 Heart failure, unspecified: Secondary | ICD-10-CM | POA: Diagnosis not present

## 2024-04-01 DIAGNOSIS — D696 Thrombocytopenia, unspecified: Secondary | ICD-10-CM | POA: Diagnosis not present

## 2024-04-01 DIAGNOSIS — N1832 Chronic kidney disease, stage 3b: Secondary | ICD-10-CM | POA: Diagnosis not present

## 2024-04-01 DIAGNOSIS — I480 Paroxysmal atrial fibrillation: Secondary | ICD-10-CM | POA: Diagnosis not present

## 2024-04-02 ENCOUNTER — Inpatient Hospital Stay

## 2024-04-02 ENCOUNTER — Telehealth: Payer: Self-pay

## 2024-04-02 DIAGNOSIS — C50919 Malignant neoplasm of unspecified site of unspecified female breast: Secondary | ICD-10-CM

## 2024-04-02 DIAGNOSIS — Z5111 Encounter for antineoplastic chemotherapy: Secondary | ICD-10-CM | POA: Diagnosis not present

## 2024-04-02 DIAGNOSIS — C7801 Secondary malignant neoplasm of right lung: Secondary | ICD-10-CM | POA: Diagnosis not present

## 2024-04-02 DIAGNOSIS — C7951 Secondary malignant neoplasm of bone: Secondary | ICD-10-CM | POA: Diagnosis not present

## 2024-04-02 DIAGNOSIS — C7802 Secondary malignant neoplasm of left lung: Secondary | ICD-10-CM | POA: Diagnosis not present

## 2024-04-02 DIAGNOSIS — C787 Secondary malignant neoplasm of liver and intrahepatic bile duct: Secondary | ICD-10-CM | POA: Diagnosis not present

## 2024-04-02 LAB — BASIC METABOLIC PANEL - CANCER CENTER ONLY
Anion gap: 12 (ref 5–15)
BUN: 26 mg/dL — ABNORMAL HIGH (ref 8–23)
CO2: 24 mmol/L (ref 22–32)
Calcium: 9.2 mg/dL (ref 8.9–10.3)
Chloride: 107 mmol/L (ref 98–111)
Creatinine: 1.54 mg/dL — ABNORMAL HIGH (ref 0.44–1.00)
GFR, Estimated: 32 mL/min — ABNORMAL LOW (ref >60)
Glucose, Bld: 131 mg/dL — ABNORMAL HIGH (ref 70–99)
Potassium: 3.9 mmol/L (ref 3.5–5.1)
Sodium: 142 mmol/L (ref 135–145)

## 2024-04-02 MED ORDER — MAGNESIUM SULFATE 2 GM/50ML IV SOLN
2.0000 g | Freq: Once | INTRAVENOUS | Status: AC
  Administered 2024-04-02: 2 g via INTRAVENOUS
  Filled 2024-04-02: qty 50

## 2024-04-02 NOTE — Telephone Encounter (Signed)
-----   Message from Olam Ned sent at 04/02/2024 10:14 AM EDT ----- Please forward the basic metabolic panel from today to cardiology, Dr. Jeffrie.

## 2024-04-02 NOTE — Telephone Encounter (Signed)
 I forward patient BMP to Dr. Jeffrie

## 2024-04-06 DIAGNOSIS — I4811 Longstanding persistent atrial fibrillation: Secondary | ICD-10-CM | POA: Diagnosis not present

## 2024-04-06 DIAGNOSIS — D696 Thrombocytopenia, unspecified: Secondary | ICD-10-CM | POA: Diagnosis not present

## 2024-04-06 DIAGNOSIS — I5033 Acute on chronic diastolic (congestive) heart failure: Secondary | ICD-10-CM | POA: Diagnosis not present

## 2024-04-06 DIAGNOSIS — I130 Hypertensive heart and chronic kidney disease with heart failure and stage 1 through stage 4 chronic kidney disease, or unspecified chronic kidney disease: Secondary | ICD-10-CM | POA: Diagnosis not present

## 2024-04-06 DIAGNOSIS — E43 Unspecified severe protein-calorie malnutrition: Secondary | ICD-10-CM | POA: Diagnosis not present

## 2024-04-06 DIAGNOSIS — N1832 Chronic kidney disease, stage 3b: Secondary | ICD-10-CM | POA: Diagnosis not present

## 2024-04-09 ENCOUNTER — Other Ambulatory Visit: Payer: Self-pay

## 2024-04-09 ENCOUNTER — Inpatient Hospital Stay (HOSPITAL_COMMUNITY)
Admission: EM | Admit: 2024-04-09 | Discharge: 2024-04-12 | DRG: 193 | Disposition: A | Discharge status: Home Health | Attending: Internal Medicine | Admitting: Internal Medicine

## 2024-04-09 ENCOUNTER — Observation Stay (HOSPITAL_BASED_OUTPATIENT_CLINIC_OR_DEPARTMENT_OTHER)

## 2024-04-09 ENCOUNTER — Emergency Department (HOSPITAL_COMMUNITY)

## 2024-04-09 ENCOUNTER — Encounter (HOSPITAL_COMMUNITY): Payer: Self-pay | Admitting: Internal Medicine

## 2024-04-09 DIAGNOSIS — N1832 Chronic kidney disease, stage 3b: Secondary | ICD-10-CM | POA: Diagnosis not present

## 2024-04-09 DIAGNOSIS — Z8543 Personal history of malignant neoplasm of ovary: Secondary | ICD-10-CM | POA: Diagnosis not present

## 2024-04-09 DIAGNOSIS — E782 Mixed hyperlipidemia: Secondary | ICD-10-CM | POA: Diagnosis not present

## 2024-04-09 DIAGNOSIS — I2510 Atherosclerotic heart disease of native coronary artery without angina pectoris: Secondary | ICD-10-CM | POA: Diagnosis present

## 2024-04-09 DIAGNOSIS — I4819 Other persistent atrial fibrillation: Secondary | ICD-10-CM | POA: Diagnosis not present

## 2024-04-09 DIAGNOSIS — Z66 Do not resuscitate: Secondary | ICD-10-CM | POA: Diagnosis not present

## 2024-04-09 DIAGNOSIS — Z888 Allergy status to other drugs, medicaments and biological substances status: Secondary | ICD-10-CM | POA: Diagnosis not present

## 2024-04-09 DIAGNOSIS — R079 Chest pain, unspecified: Secondary | ICD-10-CM | POA: Diagnosis not present

## 2024-04-09 DIAGNOSIS — R0989 Other specified symptoms and signs involving the circulatory and respiratory systems: Secondary | ICD-10-CM | POA: Diagnosis not present

## 2024-04-09 DIAGNOSIS — I480 Paroxysmal atrial fibrillation: Secondary | ICD-10-CM | POA: Diagnosis not present

## 2024-04-09 DIAGNOSIS — K219 Gastro-esophageal reflux disease without esophagitis: Secondary | ICD-10-CM | POA: Diagnosis not present

## 2024-04-09 DIAGNOSIS — E785 Hyperlipidemia, unspecified: Secondary | ICD-10-CM | POA: Diagnosis present

## 2024-04-09 DIAGNOSIS — I10 Essential (primary) hypertension: Secondary | ICD-10-CM | POA: Diagnosis present

## 2024-04-09 DIAGNOSIS — C7951 Secondary malignant neoplasm of bone: Secondary | ICD-10-CM | POA: Diagnosis present

## 2024-04-09 DIAGNOSIS — Z961 Presence of intraocular lens: Secondary | ICD-10-CM | POA: Diagnosis not present

## 2024-04-09 DIAGNOSIS — D630 Anemia in neoplastic disease: Secondary | ICD-10-CM | POA: Diagnosis present

## 2024-04-09 DIAGNOSIS — Z79899 Other long term (current) drug therapy: Secondary | ICD-10-CM | POA: Diagnosis not present

## 2024-04-09 DIAGNOSIS — Z9049 Acquired absence of other specified parts of digestive tract: Secondary | ICD-10-CM

## 2024-04-09 DIAGNOSIS — R54 Age-related physical debility: Secondary | ICD-10-CM | POA: Diagnosis not present

## 2024-04-09 DIAGNOSIS — Z83438 Family history of other disorder of lipoprotein metabolism and other lipidemia: Secondary | ICD-10-CM | POA: Diagnosis not present

## 2024-04-09 DIAGNOSIS — C3490 Malignant neoplasm of unspecified part of unspecified bronchus or lung: Secondary | ICD-10-CM | POA: Diagnosis not present

## 2024-04-09 DIAGNOSIS — Z853 Personal history of malignant neoplasm of breast: Secondary | ICD-10-CM | POA: Diagnosis not present

## 2024-04-09 DIAGNOSIS — Z90722 Acquired absence of ovaries, bilateral: Secondary | ICD-10-CM

## 2024-04-09 DIAGNOSIS — Z7982 Long term (current) use of aspirin: Secondary | ICD-10-CM | POA: Diagnosis not present

## 2024-04-09 DIAGNOSIS — D649 Anemia, unspecified: Secondary | ICD-10-CM | POA: Diagnosis present

## 2024-04-09 DIAGNOSIS — J90 Pleural effusion, not elsewhere classified: Secondary | ICD-10-CM | POA: Diagnosis not present

## 2024-04-09 DIAGNOSIS — I4891 Unspecified atrial fibrillation: Secondary | ICD-10-CM | POA: Diagnosis present

## 2024-04-09 DIAGNOSIS — J9811 Atelectasis: Secondary | ICD-10-CM | POA: Diagnosis not present

## 2024-04-09 DIAGNOSIS — Z8249 Family history of ischemic heart disease and other diseases of the circulatory system: Secondary | ICD-10-CM

## 2024-04-09 DIAGNOSIS — I5033 Acute on chronic diastolic (congestive) heart failure: Secondary | ICD-10-CM | POA: Diagnosis not present

## 2024-04-09 DIAGNOSIS — Z85828 Personal history of other malignant neoplasm of skin: Secondary | ICD-10-CM | POA: Diagnosis not present

## 2024-04-09 DIAGNOSIS — Z85118 Personal history of other malignant neoplasm of bronchus and lung: Secondary | ICD-10-CM

## 2024-04-09 DIAGNOSIS — R000 Tachycardia, unspecified: Secondary | ICD-10-CM | POA: Diagnosis not present

## 2024-04-09 DIAGNOSIS — Z803 Family history of malignant neoplasm of breast: Secondary | ICD-10-CM

## 2024-04-09 DIAGNOSIS — C787 Secondary malignant neoplasm of liver and intrahepatic bile duct: Secondary | ICD-10-CM | POA: Diagnosis present

## 2024-04-09 DIAGNOSIS — Z9841 Cataract extraction status, right eye: Secondary | ICD-10-CM

## 2024-04-09 DIAGNOSIS — J189 Pneumonia, unspecified organism: Principal | ICD-10-CM | POA: Diagnosis present

## 2024-04-09 DIAGNOSIS — I130 Hypertensive heart and chronic kidney disease with heart failure and stage 1 through stage 4 chronic kidney disease, or unspecified chronic kidney disease: Secondary | ICD-10-CM | POA: Diagnosis not present

## 2024-04-09 DIAGNOSIS — Z90710 Acquired absence of both cervix and uterus: Secondary | ICD-10-CM

## 2024-04-09 DIAGNOSIS — Z9842 Cataract extraction status, left eye: Secondary | ICD-10-CM

## 2024-04-09 LAB — COMPREHENSIVE METABOLIC PANEL WITH GFR
ALT: 50 U/L — ABNORMAL HIGH (ref 0–44)
AST: 34 U/L (ref 15–41)
Albumin: 3.1 g/dL — ABNORMAL LOW (ref 3.5–5.0)
Alkaline Phosphatase: 131 U/L — ABNORMAL HIGH (ref 38–126)
Anion gap: 8 (ref 5–15)
BUN: 35 mg/dL — ABNORMAL HIGH (ref 8–23)
CO2: 20 mmol/L — ABNORMAL LOW (ref 22–32)
Calcium: 9.2 mg/dL (ref 8.9–10.3)
Chloride: 109 mmol/L (ref 98–111)
Creatinine, Ser: 1.57 mg/dL — ABNORMAL HIGH (ref 0.44–1.00)
GFR, Estimated: 32 mL/min — ABNORMAL LOW (ref >60)
Glucose, Bld: 124 mg/dL — ABNORMAL HIGH (ref 70–99)
Potassium: 5 mmol/L (ref 3.5–5.1)
Sodium: 137 mmol/L (ref 135–145)
Total Bilirubin: 0.9 mg/dL (ref 0.0–1.2)
Total Protein: 6.3 g/dL — ABNORMAL LOW (ref 6.5–8.1)

## 2024-04-09 LAB — CBC
HCT: 35.1 % — ABNORMAL LOW (ref 36.0–46.0)
Hemoglobin: 11 g/dL — ABNORMAL LOW (ref 12.0–15.0)
MCH: 29.7 pg (ref 26.0–34.0)
MCHC: 31.3 g/dL (ref 30.0–36.0)
MCV: 94.9 fL (ref 80.0–100.0)
Platelets: 243 K/uL (ref 150–400)
RBC: 3.7 MIL/uL — ABNORMAL LOW (ref 3.87–5.11)
RDW: 15.4 % (ref 11.5–15.5)
WBC: 16.1 K/uL — ABNORMAL HIGH (ref 4.0–10.5)
nRBC: 0 % (ref 0.0–0.2)

## 2024-04-09 LAB — D-DIMER, QUANTITATIVE: D-Dimer, Quant: 3.57 ug{FEU}/mL — ABNORMAL HIGH (ref 0.00–0.50)

## 2024-04-09 LAB — BRAIN NATRIURETIC PEPTIDE: B Natriuretic Peptide: 954.6 pg/mL — ABNORMAL HIGH (ref 0.0–100.0)

## 2024-04-09 LAB — ECHOCARDIOGRAM COMPLETE
Area-P 1/2: 4.21 cm2
Height: 66 in
S' Lateral: 2.5 cm
Weight: 1758.4 [oz_av]

## 2024-04-09 LAB — I-STAT CG4 LACTIC ACID, ED: Lactic Acid, Venous: 1.5 mmol/L (ref 0.5–1.9)

## 2024-04-09 LAB — TROPONIN I (HIGH SENSITIVITY)
Troponin I (High Sensitivity): 17 ng/L (ref <18)
Troponin I (High Sensitivity): 16 ng/L (ref <18)

## 2024-04-09 LAB — MAGNESIUM: Magnesium: 1.3 mg/dL — ABNORMAL LOW (ref 1.7–2.4)

## 2024-04-09 MED ORDER — HEPARIN SODIUM (PORCINE) 5000 UNIT/ML IJ SOLN
5000.0000 [IU] | Freq: Three times a day (TID) | INTRAMUSCULAR | Status: DC
  Administered 2024-04-09 – 2024-04-12 (×9): 5000 [IU] via SUBCUTANEOUS
  Filled 2024-04-09 (×9): qty 1

## 2024-04-09 MED ORDER — OXYCODONE HCL 5 MG PO TABS: 5.0000 mg | ORAL_TABLET | Freq: Four times a day (QID) | ORAL | Status: DC | PRN

## 2024-04-09 MED ORDER — ASPIRIN 81 MG PO TBEC
81.0000 mg | DELAYED_RELEASE_TABLET | Freq: Every day | ORAL | Status: DC
  Administered 2024-04-10 – 2024-04-12 (×3): 81 mg via ORAL
  Filled 2024-04-09 (×3): qty 1

## 2024-04-09 MED ORDER — METOPROLOL TARTRATE 25 MG PO TABS
50.0000 mg | ORAL_TABLET | Freq: Two times a day (BID) | ORAL | Status: DC
  Administered 2024-04-09 – 2024-04-12 (×6): 50 mg via ORAL
  Filled 2024-04-09 (×6): qty 2

## 2024-04-09 MED ORDER — POLYETHYLENE GLYCOL 3350 17 G PO PACK: 17.0000 g | PACK | Freq: Every day | ORAL | Status: DC | PRN

## 2024-04-09 MED ORDER — ONDANSETRON HCL 4 MG/2ML IJ SOLN
4.0000 mg | Freq: Once | INTRAMUSCULAR | Status: AC
  Administered 2024-04-09: 4 mg via INTRAVENOUS
  Filled 2024-04-09: qty 2

## 2024-04-09 MED ORDER — DILTIAZEM HCL-DEXTROSE 125-5 MG/125ML-% IV SOLN (PREMIX)
5.0000 mg/h | INTRAVENOUS | Status: DC
  Administered 2024-04-09 – 2024-04-10 (×2): 5 mg/h via INTRAVENOUS
  Filled 2024-04-09 (×3): qty 125

## 2024-04-09 MED ORDER — FUROSEMIDE 10 MG/ML IJ SOLN
40.0000 mg | Freq: Once | INTRAMUSCULAR | Status: AC
  Administered 2024-04-09: 40 mg via INTRAVENOUS
  Filled 2024-04-09: qty 4

## 2024-04-09 MED ORDER — DILTIAZEM LOAD VIA INFUSION
20.0000 mg | Freq: Once | INTRAVENOUS | Status: AC
  Administered 2024-04-09: 20 mg via INTRAVENOUS
  Filled 2024-04-09: qty 20

## 2024-04-09 MED ORDER — HYDROMORPHONE HCL 1 MG/ML IJ SOLN
1.0000 mg | Freq: Once | INTRAMUSCULAR | Status: DC
  Filled 2024-04-09: qty 1

## 2024-04-09 MED ORDER — SODIUM CHLORIDE 0.9 % IV SOLN
500.0000 mg | INTRAVENOUS | Status: DC
  Administered 2024-04-10 – 2024-04-11 (×2): 500 mg via INTRAVENOUS
  Filled 2024-04-09 (×3): qty 5

## 2024-04-09 MED ORDER — MORPHINE SULFATE (PF) 4 MG/ML IV SOLN
4.0000 mg | Freq: Once | INTRAVENOUS | Status: AC
  Administered 2024-04-09: 4 mg via INTRAVENOUS
  Filled 2024-04-09: qty 1

## 2024-04-09 MED ORDER — ISOSORBIDE MONONITRATE ER 30 MG PO TB24
60.0000 mg | ORAL_TABLET | Freq: Every day | ORAL | Status: DC
  Administered 2024-04-10: 60 mg via ORAL
  Filled 2024-04-09: qty 2

## 2024-04-09 MED ORDER — FUROSEMIDE 20 MG PO TABS
40.0000 mg | ORAL_TABLET | Freq: Two times a day (BID) | ORAL | Status: DC
Start: 2024-04-10 — End: 2024-04-09

## 2024-04-09 MED ORDER — METOPROLOL TARTRATE 5 MG/5ML IV SOLN: 5.0000 mg | Freq: Once | INTRAVENOUS | Status: DC

## 2024-04-09 MED ORDER — HYDROMORPHONE HCL 1 MG/ML IJ SOLN
1.0000 mg | Freq: Once | INTRAMUSCULAR | Status: AC
  Administered 2024-04-09: 1 mg via INTRAVENOUS
  Filled 2024-04-09: qty 1

## 2024-04-09 MED ORDER — IOHEXOL 350 MG/ML SOLN
75.0000 mL | Freq: Once | INTRAVENOUS | Status: AC | PRN
  Administered 2024-04-09: 60 mL via INTRAVENOUS

## 2024-04-09 MED ORDER — FUROSEMIDE 10 MG/ML IJ SOLN
40.0000 mg | Freq: Two times a day (BID) | INTRAMUSCULAR | Status: DC
  Administered 2024-04-10 (×2): 40 mg via INTRAVENOUS
  Filled 2024-04-09 (×2): qty 4

## 2024-04-09 MED ORDER — MAGNESIUM OXIDE 400 MG PO TABS
800.0000 mg | ORAL_TABLET | Freq: Two times a day (BID) | ORAL | Status: DC
  Filled 2024-04-09: qty 2

## 2024-04-09 MED ORDER — SODIUM CHLORIDE 0.9 % IV SOLN
500.0000 mg | Freq: Once | INTRAVENOUS | Status: AC
  Administered 2024-04-09: 500 mg via INTRAVENOUS
  Filled 2024-04-09: qty 5

## 2024-04-09 MED ORDER — ROSUVASTATIN CALCIUM 5 MG PO TABS
10.0000 mg | ORAL_TABLET | Freq: Every day | ORAL | Status: DC
  Administered 2024-04-09 – 2024-04-11 (×3): 10 mg via ORAL
  Filled 2024-04-09 (×3): qty 2

## 2024-04-09 MED ORDER — MAGNESIUM OXIDE -MG SUPPLEMENT 400 (240 MG) MG PO TABS
800.0000 mg | ORAL_TABLET | Freq: Two times a day (BID) | ORAL | Status: DC
  Administered 2024-04-09 – 2024-04-12 (×6): 800 mg via ORAL
  Filled 2024-04-09 (×6): qty 2

## 2024-04-09 MED ORDER — SODIUM CHLORIDE 0.9 % IV SOLN
1.0000 g | Freq: Once | INTRAVENOUS | Status: AC
  Administered 2024-04-09: 1 g via INTRAVENOUS
  Filled 2024-04-09: qty 10

## 2024-04-09 MED ORDER — SODIUM CHLORIDE 0.9 % IV SOLN
2.0000 g | INTRAVENOUS | Status: DC
  Administered 2024-04-10 – 2024-04-11 (×2): 2 g via INTRAVENOUS
  Filled 2024-04-09 (×2): qty 20

## 2024-04-09 MED ORDER — MELATONIN 3 MG PO TABS
3.0000 mg | ORAL_TABLET | Freq: Every evening | ORAL | Status: DC | PRN
  Administered 2024-04-10 – 2024-04-11 (×2): 3 mg via ORAL
  Filled 2024-04-09 (×2): qty 1

## 2024-04-09 MED ORDER — SODIUM CHLORIDE 0.9% FLUSH
3.0000 mL | Freq: Two times a day (BID) | INTRAVENOUS | Status: DC
  Administered 2024-04-09 – 2024-04-12 (×7): 3 mL via INTRAVENOUS

## 2024-04-09 MED ORDER — PANTOPRAZOLE SODIUM 20 MG PO TBEC
20.0000 mg | DELAYED_RELEASE_TABLET | Freq: Every day | ORAL | Status: DC
  Administered 2024-04-10 – 2024-04-12 (×3): 20 mg via ORAL
  Filled 2024-04-09 (×3): qty 1

## 2024-04-09 MED ORDER — ACETAMINOPHEN 650 MG RE SUPP: 650.0000 mg | Freq: Four times a day (QID) | RECTAL | Status: DC | PRN

## 2024-04-09 MED ORDER — ACETAMINOPHEN 325 MG PO TABS: 650.0000 mg | ORAL_TABLET | Freq: Four times a day (QID) | ORAL | Status: DC | PRN

## 2024-04-09 MED ORDER — MAGNESIUM SULFATE 2 GM/50ML IV SOLN
2.0000 g | Freq: Once | INTRAVENOUS | Status: AC
  Administered 2024-04-09: 2 g via INTRAVENOUS
  Filled 2024-04-09: qty 50

## 2024-04-09 NOTE — ED Provider Notes (Signed)
 [place] [facility] AT [facility] Provider Note   CSN: [id number] Arrival date & time: 04/09/24  9168     Patient presents with: Chest Pain   [name] is a 89 y.o. female.   Pt is a 89 yo female with pmhx significant for afib (not on thinners), htn, hld, anemia, cad, ovarian carcinoma, breast cancer with mets to bones, liver, lungs.  Pt is currently on Faslodex  only for tx.  She is followed by Dr. Cloretta.  Pt presents to the ED today with cp.  She said the pain woke her up from sleep around 0300 and has gotten worse.  Pt took asa at home.  EMS gave nitroglycerin  which did not help.  Pt continues to have pain which she describes as a pressure sitting on her chest.         Prior to Admission medications   Medication Sig Start Date End Date Taking? Authorizing Provider  aspirin  EC 81 MG tablet Take 81 mg by mouth daily.    [provider]  BELSOMRA 10 MG TABS Take 10 mg by mouth at bedtime. 01/20/19   [provider]  Calcium  Carbonate-Vitamin D  (CALTRATE 600+D PO) Take 1 tablet by mouth 2 (two) times daily.    [provider]  Cholecalciferol  (VITAMIN D3) 50 MCG (2000 UT) TABS Take 2,000 Units by mouth in the morning and at bedtime.    [provider]  cyanocobalamin  (VITAMIN B12) 1000 MCG tablet Take 1,000 mcg by mouth daily.    [provider]  diltiazem  (TIAZAC ) 360 MG 24 hr capsule Take 360 mg by mouth daily. 01/29/24   [provider]  doxycycline (VIBRA-TABS) 100 MG tablet Take 100 mg by mouth 2 (two) times daily. 02/20/24   [provider]  furosemide  (LASIX ) 40 MG tablet Take 1 tablet (40 mg total) by mouth 2 (two) times daily. 01/27/24 01/26/25  Jeffrie Oneil BROCKS, MD  hydroxychloroquine  (PLAQUENIL ) 200 MG tablet Take 200 mg by mouth daily. 11/08/21   [provider]  isosorbide  mononitrate (IMDUR ) 60 MG 24 hr tablet Take 1 tablet (60 mg total) by mouth daily. 05/16/23   Jeffrie Oneil BROCKS, MD   loperamide  (IMODIUM ) 2 MG capsule Take 1 capsule (2 mg total) by mouth as needed for diarrhea or loose stools. Patient not taking: Reported on 03/26/2024 12/04/23   Ezenduka, Nkeiruka J, MD  magnesium  oxide (MAG-OX) 400 MG tablet Take 2 tablets (800 mg total) by mouth 2 (two) times daily. 02/27/24   [name], MD  melatonin 3 MG TABS tablet Take 1 tablet (3 mg total) by mouth at bedtime as needed. 12/29/23   [name], MD  metoprolol  tartrate (LOPRESSOR ) 50 MG tablet Take 1 tablet (50 mg total) by mouth 2 (two) times daily. 01/20/24 04/19/24  [name], PA-C  Multiple Vitamins-Minerals (DECUBI-VITE PO) Take 1 capsule by mouth daily at 6 (six) AM. For wound healing    [provider]  Multiple Vitamins-Minerals (MULTIVITAMIN WOMEN 50+) TABS Take 1 tablet by mouth daily with breakfast.    [provider]  Neomycin-Bacitracin-Polymyxin (TRIPLE ANTIBIOTIC) OINT Apply topically daily. To skin tears prn    [provider]  oxyCODONE  (OXY IR/ROXICODONE ) 5 MG immediate release tablet Take 1 tablet (5 mg total) by mouth every 6 (six) hours as needed. 03/26/24   [name], NP  pantoprazole  (PROTONIX ) 20 MG tablet Take 1 tablet (20 mg total) by mouth daily. 12/05/23   Ezenduka, Nkeiruka  J, MD  potassium chloride  (KLOR-CON ) 20 MEQ packet Take 20 mEq by mouth 2 (two) times daily. 01/27/24   Jeffrie Oneil BROCKS, MD  PROCTOZONE -HC 2.5 % rectal cream PLACE 1 APPLICATION RECTALLY 2 TIMES A DAY AS NEEDED FOR HEMORRHOIDS. Patient not taking: Reported on 03/26/2024 01/09/15   Cross, Melissa D, NP  rosuvastatin  (CRESTOR ) 10 MG tablet Take 10 mg by mouth at bedtime.    [provider]  sodium bicarbonate  650 MG tablet Take 1 tablet (650 mg total) by mouth 2 (two) times daily. 12/08/23   [name], [name], MD  sucralfate  (CARAFATE ) 1 GM/10ML suspension Take 10 mLs (1 g total) by mouth 4 (four) times daily -  with meals and at bedtime. Patient taking differently: Take 1 g by mouth 4 (four)  times daily. 12/04/23   Ezenduka, Nkeiruka J, MD  tiZANidine  (ZANAFLEX ) 4 MG tablet Take 4 mg by mouth 2 (two) times daily as needed for muscle spasms. Patient not taking: Reported on 03/26/2024 10/10/23   [provider]  triamcinolone  (KENALOG) 0.1 % Apply 1 application  topically as needed (for rashes- affected areas). 04/04/20   [provider]  Wound Dressings (SONAFINE) Apply 1 Application topically 2 (two) times daily as needed (Apply to areas of redness from radiation as needed. Call Dr. Charisse nurse at [phone] if you have any questions). 12/04/23   Ezenduka, Nkeiruka J, MD  Zinc  Oxide (BAZA PROTECT MOISTURE BARRIER EX) Apply topically 2 (two) times daily. To buttocks    [provider]    Allergies: Ramipril    Review of Systems  Cardiovascular:  Positive for chest pain.  All other systems reviewed and are negative.   Updated Vital Signs BP 127/82   Pulse (!) 141   Temp (!) 97.5 F (36.4 C) (Oral) Comment: Simultaneous filing. User may not have seen previous data.  Resp 15   Ht 5' 6 (1.676 m)   Wt 47.6 kg   LMP 10/07/1974   SpO2 92%   BMI 16.95 kg/m   Physical Exam Vitals and nursing note reviewed.  Constitutional:      Appearance: She is well-developed.  HENT:     Head: Normocephalic and atraumatic.  Eyes:     Extraocular Movements: Extraocular movements intact.     Pupils: Pupils are equal, round, and reactive to light.  Cardiovascular:     Rate and Rhythm: Tachycardia present. Rhythm irregular.     Heart sounds: Normal heart sounds.  Pulmonary:     Effort: Pulmonary effort is normal.     Breath sounds: Normal breath sounds.  Abdominal:     General: Bowel sounds are normal.     Palpations: Abdomen is soft.  Musculoskeletal:        General: Normal range of motion.     Cervical back: Normal range of motion and neck supple.     Right lower leg: Edema present.     Left lower leg: Edema present.  Skin:    General: Skin is warm and  dry.     Capillary Refill: Capillary refill takes less than 2 seconds.  Neurological:     General: No focal deficit present.     Mental Status: She is alert and oriented to person, place, and time.  Psychiatric:        Mood and Affect: Mood normal.        Behavior: Behavior normal.     (all labs ordered are listed, but only abnormal results are displayed) Labs Reviewed  MAGNESIUM  - Abnormal; Notable for the following components:      Result Value   Magnesium  1.3 (*)    All other components within normal limits  CBC - Abnormal; Notable for the following components:   WBC 16.1 (*)    RBC 3.70 (*)    Hemoglobin 11.0 (*)    HCT 35.1 (*)    All other components within normal limits  COMPREHENSIVE METABOLIC PANEL WITH GFR - Abnormal; Notable for the following components:   CO2 20 (*)    Glucose, Bld 124 (*)    BUN 35 (*)    Creatinine, Ser 1.57 (*)    Total Protein 6.3 (*)    Albumin  3.1 (*)    ALT 50 (*)    Alkaline Phosphatase 131 (*)    GFR, Estimated 32 (*)    All other components within normal limits  D-DIMER, QUANTITATIVE - Abnormal; Notable for the following components:   D-Dimer, Quant 3.57 (*)    All other components within normal limits  CULTURE, BLOOD (ROUTINE X 2)  CULTURE, BLOOD (ROUTINE X 2)  I-STAT CG4 LACTIC ACID, ED  TROPONIN I (HIGH SENSITIVITY)  TROPONIN I (HIGH SENSITIVITY)    EKG: EKG Interpretation Date/Time:  Friday April 09 2024 08:39:23 EDT Ventricular Rate:  89 PR Interval:    QRS Duration:  84 QT Interval:  360 QTC Calculation: 438 R Axis:   -8  Text Interpretation: Atrial fibrillation Probable anteroseptal infarct, old Borderline T abnormalities, inferior leads No significant change since last tracing Confirmed by Dean Clarity [phone]) on 04/09/2024 9:11:50 AM  Radiology: CT Angio Chest PE W and/or Wo Contrast Result Date: 04/09/2024 CLINICAL DATA:  MC-CTPulmonary embolism (PE) suspected, high prob history of lung cancer. Sternal chest  pain LEFT shoulder pain. * Tracking Code: BO * EXAM: CT ANGIOGRAPHY CHEST WITH CONTRAST TECHNIQUE: Multidetector CT imaging of the chest was performed using the standard protocol during bolus administration of intravenous contrast. Multiplanar CT image reconstructions and MIPs were obtained to evaluate the vascular anatomy. RADIATION DOSE REDUCTION: This exam was performed according to the departmental dose-optimization program which includes automated exposure control, adjustment of the mA and/or kV according to patient size and/or use of iterative reconstruction technique. CONTRAST:  60mL OMNIPAQUE  IOHEXOL  350 MG/ML SOLN COMPARISON:  CT chest 03/25/2024 FINDINGS: Cardiovascular: No filling defects within the pulmonary arteries to suggest acute pulmonary embolism. No significant vascular findings. Normal heart size. No pericardial effusion. Mediastinum/Nodes: No axillary or supraclavicular adenopathy. No mediastinal or hilar adenopathy. No pericardial fluid. Esophagus normal. Lungs/Pleura: Bilateral pleural effusions similar prior. Moderate effusion on the RIGHT and small on the LEFT. There is passive atelectasis of the RIGHT lower lobe. Small subcentimeter nodules in the lung base not changed from recent CT. Increased consolidation at the LEFT lung base (image 259/series 7 and 90/series 6. Upper Abdomen: Hepatic metastasis not well appreciated no acute findings Musculoskeletal: Sclerotic skeletal metastasis again noted. No change. Surgical clips in the lateral RIGHT breast Review of the MIP images confirms the above findings. IMPRESSION: 1. No evidence acute pulmonary embolism. 2. Bilateral pleural effusions with passive atelectasis of the RIGHT lower lobe. 3. Increased consolidation at the LEFT lung base. Concern for LEFT lower lobe pneumonia. 4. Stable small pulmonary nodules at the lung bases. 5. Stable skeletal metastasis. Electronically Signed   By: Jackquline Boxer M.D.   On: 04/09/2024 11:23   DG Chest  Port 1 View Result Date: 04/09/2024 CLINICAL DATA:  Chest pain EXAM: PORTABLE CHEST 1 VIEW COMPARISON:  01/01/2024 FINDINGS: Low volume film. The cardio pericardial silhouette is enlarged. Vascular congestion without overt airspace pulmonary edema. Bibasilar atelectasis/infiltrate is similar in the interval with small bilateral pleural effusions. No acute bony abnormality. Telemetry leads overlie the chest. IMPRESSION: 1. Low volume film with vascular congestion and small bilateral pleural effusions. 2. Bibasilar atelectasis/infiltrate is similar in the interval. Electronically Signed   By: Camellia Candle M.D.   On: 04/09/2024 08:57     Procedures   Medications Ordered in the ED  magnesium  sulfate IVPB 2 g 50 mL (2 g Intravenous New Bag/Given 04/09/24 1212)  diltiazem  (CARDIZEM ) 1 mg/mL load via infusion 20 mg (has no administration in time range)    And  diltiazem  (CARDIZEM ) 125 mg in dextrose  5% 125 mL (1 mg/mL) infusion (has no administration in time range)  cefTRIAXone  (ROCEPHIN ) 1 g in sodium chloride  0.9 % 100 mL IVPB (1 g Intravenous New Bag/Given 04/09/24 1204)  azithromycin  (ZITHROMAX ) 500 mg in sodium chloride  0.9 % 250 mL IVPB (has no administration in time range)  morphine  (PF) 4 MG/ML injection 4 mg (4 mg Intravenous Given 04/09/24 0857)  ondansetron  (ZOFRAN ) injection 4 mg (4 mg Intravenous Given 04/09/24 0858)  furosemide  (LASIX ) injection 40 mg (40 mg Intravenous Given 04/09/24 0929)  HYDROmorphone  (DILAUDID ) injection 1 mg (1 mg Intravenous Given 04/09/24 0929)  HYDROmorphone  (DILAUDID ) injection 1 mg (1 mg Intravenous Given 04/09/24 1202)  iohexol  (OMNIPAQUE ) 350 MG/ML injection 75 mL (60 mLs Intravenous Contrast Given 04/09/24 1109)                                    Medical Decision Making Amount and/or Complexity of Data Reviewed Labs: ordered. Radiology: ordered.  Risk Prescription drug management. Decision regarding hospitalization.   This patient presents to the ED for  concern of cp, this involves an extensive number of treatment options, and is a complaint that carries with it a high risk of complications and morbidity.  The differential diagnosis includes afib, anemia, cardiac, pulm, mets   Co morbidities that complicate the patient evaluation   afib (not on thinners), htn, hld, anemia, cad, ovarian carcinoma, breast cancer with mets to bones, liver, lungs   Additional history obtained:  Additional history obtained from epic chart review External records from outside source obtained and reviewed including EMS report   Lab Tests:  I Ordered, and personally interpreted labs.  The pertinent results include:  cbc with wbc elevated at 16, hgb 11 (stable), trop 17, cmp with bun 35 and cr 1.57 (stable), mg low at 1.3; ddimer + 3.57   Imaging Studies ordered:  I ordered imaging studies including cxr and CT chest I independently visualized and interpreted imaging which showed  CXR:  Low volume film with vascular congestion and small bilateral  pleural effusions.  2. Bibasilar atelectasis/infiltrate is similar in the interval.  CT chest:  No evidence acute pulmonary embolism.  2. Bilateral pleural effusions with passive atelectasis of the RIGHT  lower lobe.  3. Increased consolidation at the LEFT lung base. Concern for LEFT  lower lobe pneumonia.  4. Stable small pulmonary nodules at the lung bases.  5. Stable skeletal metastasis.   I agree with the radiologist interpretation   Cardiac Monitoring:  The patient was maintained on a cardiac monitor.  I personally viewed and interpreted the cardiac monitored which showed an underlying rhythm of: afib   Medicines ordered and prescription drug management:  I ordered medication including morphine /zofran   for sx  Reevaluation of the patient after these medicines showed that the patient improved I have reviewed the patients home medicines and have made adjustments as needed   Test  Considered:  ct   Critical Interventions:  Abx/rate control   Consultations Obtained:  I requested consultation with the hospitalist (Dr. Seena),  and discussed lab and imaging findings as well as pertinent plan - he will admit.   Problem List / ED Course:  CP:  likely due to pulmonary effusions and lll pneumonia.  Pt started on rocephin /zithromax .  She is not hypoxic. Afib with rvr:  pt started on a cardizem  drip.  She is not a DOAC candidate.  CHA2DS2/VAS Stroke Risk Points  Current as of 8 minutes ago     6 >= 2 Points: High Risk  1 to 1.99 Points: Medium Risk  0 Points: Low Risk    Last Change:       Details    This score determines the patient's risk of having a stroke if the  patient has atrial fibrillation.       Points Metrics  1 Has Congestive Heart Failure:  Yes    Current as of 8 minutes ago  1 Has Vascular Disease:  Yes    Current as of 8 minutes ago  1 Has Hypertension:  Yes    Current as of 8 minutes ago  2 Age:  40    Current as of 8 minutes ago  0 Has Diabetes Excluding Gestational Diabetes:  No    Current as of 8 minutes ago  0 Had Stroke:  No  Had TIA:  No  Had Thromboembolism:  No    Current as of 8 minutes ago  1 Female:  Yes    Current as of 8 minutes ago              Reevaluation:  After the interventions noted above, I reevaluated the patient and found that they have :improved   Social Determinants of Health:  Lives at home   Dispostion:  After consideration of the diagnostic results and the patients response to treatment, I feel that the patent would benefit from admission.    CRITICAL CARE Performed by: Mliss Boyers   Total critical care time: 30 minutes  Critical care time was exclusive of separately billable procedures and treating other patients.  Critical care was necessary to treat or prevent imminent or life-threatening deterioration.  Critical care was time spent personally by me on the following  activities: development of treatment plan with patient and/or surrogate as well as nursing, discussions with consultants, evaluation of patient's response to treatment, examination of patient, obtaining history from patient or surrogate, ordering and performing treatments and interventions, ordering and review of laboratory studies, ordering and review of radiographic studies, pulse oximetry and re-evaluation of patient's condition.      Final diagnoses:  Hypomagnesemia  Community acquired pneumonia of left lower lobe of lung  Atrial fibrillation with RVR (HCC)  Pleural effusion, bilateral    ED Discharge Orders     None          Boyers Mliss, MD 04/09/24 1253

## 2024-04-09 NOTE — Plan of Care (Signed)
  Problem: Education: Goal: Knowledge of General Education information will improve Description: Including pain rating scale, medication(s)/side effects and non-pharmacologic comfort measures Outcome: Progressing   Problem: Health Behavior/Discharge Planning: Goal: Ability to manage health-related needs will improve Outcome: Progressing   Problem: Activity: Goal: Risk for activity intolerance will decrease Outcome: Progressing   Problem: Nutrition: Goal: Adequate nutrition will be maintained Outcome: Progressing   Problem: Coping: Goal: Level of anxiety will decrease Outcome: Progressing   Problem: Safety: Goal: Ability to remain free from injury will improve Outcome: Progressing   Problem: Activity: Goal: Ability to tolerate increased activity will improve Outcome: Progressing

## 2024-04-09 NOTE — ED Triage Notes (Signed)
 Pt bib [facility] from home for cp . Pt woke up around 0300-0400 with midsternal cp and intermittent left shoulder pain.     Hx lung cancer , afib (pt not on blood thinners), anemia   0.4nitro given   EMS vs hr  84 Rr 18 96% RA  174/82 BP    20g lac

## 2024-04-09 NOTE — Progress Notes (Signed)
 Patient is AXOX4 with no clinical signs of distress or complaints of pain at this time. Patient is on room air and remains on continuous telemetry.   Safety measures are in place, call light is within reach, bed alarm on, and 4P's addressed.

## 2024-04-09 NOTE — H&P (Signed)
 History and Physical   [name] FMW:[id number] DOB: [birth year] DOA: 04/09/2024  PCP: Elliot Charm, MD   Patient coming from: Home  Chief Complaint: Chest pain  HPI: [name] is a 89 y.o. female with medical history significant of hypertension, hyperlipidemia, GERD, atrial fibrillation, CKD 3B, CAD, anemia, chronic diastolic CHF, breast cancer, ovarian cancer, metastatic disease to the liver, lungs, spine presenting with chest pain.  Patient reports that she woke from sleep with chest pain around 3 AM.  Pain increased throughout the day and is described as a pressure-like sensation.  She does have some history of pressure sensations in the morning that typically improve when she takes her morning medications.  She tried taking aspirin  at home without improvement and with the severity of the pain EMS was called.  She received nitro and route which also did not help.  She denies fevers, chills, abdominal pain, constipation, diarrhea, nausea, vomiting, shortness of breath.  ED Course: Vital signs in the ED notable for heart rate in the 90s-140s, blood pressure in the 120s-180 systolic.  Lab workup included CMP with bicarb 20, BUN 35, creatinine 1.57 which is stable for the past month but increased to 3 months ago, glucose 124, protein 6.3, albumin  3.1, ALT stable at 50, ALP stable at 131.  CBC with leukocytosis to 16.1 and hemoglobin stable at 11.  Troponin normal with repeat pending.  Lactic acid pending.  D-dimer elevated at 3.57.  Blood cultures pending.  Magnesium  low at 1.3.  Imaging studies included chest x-ray which showed low lung volumes, vascular congestion, small effusions.  CTA PE study was negative for PE did show bilateral effusions as well as a consolidation of the left lower lobe concerning for pneumonia.  Noted to have stable nodules and stable skeletal metastases.  Patient received Dilaudid , morphine , ceftriaxone , azithromycin , diltiazem  infusion, Lasix   IV, Zofran , 2 g magnesium  in the ED.  Review of Systems: As per HPI otherwise all other systems reviewed and are negative.  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   Breast cancer (HCC) 2010   RIGHT   CAD (coronary artery disease)    Dysuria    Family history of breast cancer    GERD (gastroesophageal reflux disease)    HX OF   Heart murmur    History of blood transfusion    History of breast cancer ONCOLOGIST-- DR CLORETTA   DX 2010--  RIGHT BREAST DCIS  HIGH GRADE S/P LUMPECTOMY W/ SLN BX--  NO RECURRENCE   History of kidney stones    History of ovarian cancer    2009--  S/P COLON RESECTION FOR PAPILLARY SEROUS CARCINOMA, BORDERLINE OVARIAN CANCER VERSUS PRIMARY PERITONEAL CARCINOMA WITH PSAMMOMA BODIES--  NO RECURRENCE   History of skin cancer    excision basal cell   Hyperlipidemia    Hypertension    Kidney stones 04/2013   New onset a-fib (HCC) 11/24/2023   Nocturia    Ovarian carcinoma (HCC)    papillary serous-low grade, recurrence found in fat necrosis during ileostomy   Renal stones 2014   Shingles 30 YRS AGO   Ureteral calculi    bilateral    Past Surgical History:  Procedure Laterality Date   APPENDECTOMY     BREAST LUMPECTOMY Right 04-13-2009   W/ SLN BX   BREAST LUMPECTOMY WITH RADIOACTIVE SEED LOCALIZATION Left 06/11/2019   Procedure: LEFT BREAST LUMPECTOMY WITH RADIOACTIVE  SEED LOCALIZATION;  Surgeon: Curvin Deward MOULD, MD;  Location: [facility] OR;  Service: General;  Laterality: Left;   CARDIAC CATHETERIZATION  11-11-2005 DR VICTORY SHARPS   MODERATE LAD DISEASE/ NORMAL LVF/ EF 65-75%   CATARACT EXTRACTION W/ INTRAOCULAR LENS IMPLANT Bilateral 04/26/2013   CHOLECYSTECTOMY  1990   OPEN   COLONOSCOPY     CYSTOSCOPY W/ URETERAL STENT PLACEMENT Bilateral 05/07/2013   Procedure: CYSTOSCOPY WITH STENT REPLACEMENTS;  Surgeon: Ricardo Likens, MD;  Location: [facility];  Service:  Urology;  Laterality: Bilateral;   CYSTOSCOPY WITH BIOPSY N/A 04/07/2013   Procedure: CYSTOSCOPY WITH BIOPSY and fulgeration;  Surgeon: Ricardo Likens, MD;  Location: [facility];  Service: Urology;  Laterality: N/A;   CYSTOSCOPY WITH RETROGRADE PYELOGRAM, URETEROSCOPY AND STENT PLACEMENT Bilateral 05/07/2013   Procedure: CYSTOSCOPY WITH RETROGRADE PYELOGRAM, URETEROSCOPY ;  Surgeon: Ricardo Likens, MD;  Location: [facility];  Service: Urology;  Laterality: Bilateral;   CYSTOSCOPY WITH RETROGRADE PYELOGRAM, URETEROSCOPY AND STENT PLACEMENT Bilateral 01/06/2019   Procedure: CYSTOSCOPY WITH RETROGRADE PYELOGRAM, URETEROSCOPY AND STENT PLACEMENT, FIRST STAGE;  Surgeon: Likens Ricardo, MD;  Location: [facility];  Service: Urology;  Laterality: Bilateral;   CYSTOSCOPY WITH RETROGRADE PYELOGRAM, URETEROSCOPY AND STENT PLACEMENT Bilateral 01/27/2019   Procedure: CYSTOSCOPY WITH RETROGRADE PYELOGRAM, URETEROSCOPY AND STENT PLACEMENT;  Surgeon: Likens Ricardo, MD;  Location: [facility];  Service: Urology;  Laterality: Bilateral;  75 MINS   CYSTOSCOPY WITH STENT PLACEMENT Bilateral 04/07/2013   Procedure: CYSTOSCOPY WITH STENT PLACEMENT;  Surgeon: Ricardo Likens, MD;  Location: [facility];  Service: Urology;  Laterality: Bilateral;   DX LAPAROSCOPY W/ PERITONEAL AND OMENTAL BX'S AND WASHINGS  02-16-2008   EXCISION RIGHT BREAST MASS  02-10-2002   EXP. LAP. EXTENSIVE ADHESIOLYSIS/ RESECTION TERMINAL ILEUM , ASCENDING AND DESCENDING COLON WITH CREATION ILEOSTOMY AND MUCOUS FISTULA  02-19-2008   PERFERATION AND ABD. CANCER--  TAKEDOWN ILEOSTOMY 08-23-2008   HOLMIUM LASER APPLICATION Bilateral 01/06/2019   Procedure: HOLMIUM LASER APPLICATION;  Surgeon: Likens Ricardo, MD;  Location: [facility];  Service: Urology;  Laterality: Bilateral;   HOLMIUM LASER APPLICATION Bilateral 01/27/2019   Procedure: HOLMIUM LASER APPLICATION;  Surgeon: Likens Ricardo, MD;  Location: [facility];  Service: Urology;  Laterality: Bilateral;   I & D  EXTREMITY Right 02/02/2022   Procedure: IRRIGATION AND DEBRIDEMENT HAND;  Surgeon: Shari Easter, MD;  Location: [facility] OR;  Service: Orthopedics;  Laterality: Right;   ILEOSTOMY CLOSURE  08/2008   TOTAL ABDOMINAL HYSTERECTOMY W/ BILATERAL SALPINGOOPHORECTOMY  1976   TOTAL ABDOMINAL HYSTERECTOMY W/ BILATERAL SALPINGOOPHORECTOMY  1976   VULVAR LESION REMOVAL N/A 08/22/2014   Procedure: EXCISION OF VULVAR CYST  ;  Surgeon: Ronal Elvie Pinal, MD;  Location: [facility];  Service: Gynecology;  Laterality: N/A;   WOUND DEBRIDEMENT  05/28/2012   Procedure: DEBRIDEMENT ABDOMINAL WOUND;  Surgeon: Sherlean JINNY Laughter, MD;  Location: [facility];  Service: General;  Laterality: N/A;  excision chronic wound abdominal wall    Social History  reports that she has never smoked. She has never used smokeless tobacco. She reports that she does not drink alcohol and does not use drugs.  Allergies  Allergen Reactions   Ramipril Itching    Family History  Problem Relation Age of Onset   Heart disease Mother    Hyperlipidemia Mother    Breast cancer Sister    Cancer Sister        breast  Reviewed on admission  Prior to Admission medications   Medication Sig Start  Date End Date Taking? Authorizing Provider  aspirin  EC 81 MG tablet Take 81 mg by mouth daily.    [provider]  BELSOMRA 10 MG TABS Take 10 mg by mouth at bedtime. 01/20/19   [provider]  Calcium  Carbonate-Vitamin D  (CALTRATE 600+D PO) Take 1 tablet by mouth 2 (two) times daily.    [provider]  Cholecalciferol  (VITAMIN D3) 50 MCG (2000 UT) TABS Take 2,000 Units by mouth in the morning and at bedtime.    [provider]  cyanocobalamin  (VITAMIN B12) 1000 MCG tablet Take 1,000 mcg by mouth daily.    [provider]  diltiazem  (TIAZAC ) 360 MG 24 hr capsule Take 360 mg by mouth daily. 01/29/24   [provider]  doxycycline (VIBRA-TABS) 100 MG tablet Take 100 mg by mouth 2 (two)  times daily. 02/20/24   [provider]  furosemide  (LASIX ) 40 MG tablet Take 1 tablet (40 mg total) by mouth 2 (two) times daily. 01/27/24 01/26/25  Jeffrie Oneil BROCKS, MD  hydroxychloroquine  (PLAQUENIL ) 200 MG tablet Take 200 mg by mouth daily. 11/08/21   [provider]  isosorbide  mononitrate (IMDUR ) 60 MG 24 hr tablet Take 1 tablet (60 mg total) by mouth daily. 05/16/23   Jeffrie Oneil BROCKS, MD  loperamide  (IMODIUM ) 2 MG capsule Take 1 capsule (2 mg total) by mouth as needed for diarrhea or loose stools. Patient not taking: Reported on 03/26/2024 12/04/23   Ezenduka, Nkeiruka J, MD  magnesium  oxide (MAG-OX) 400 MG tablet Take 2 tablets (800 mg total) by mouth 2 (two) times daily. 02/27/24   [name], MD  melatonin 3 MG TABS tablet Take 1 tablet (3 mg total) by mouth at bedtime as needed. 12/29/23   [name], MD  metoprolol  tartrate (LOPRESSOR ) 50 MG tablet Take 1 tablet (50 mg total) by mouth 2 (two) times daily. 01/20/24 04/19/24  Parthenia Olivia HERO, PA-C  Multiple Vitamins-Minerals (DECUBI-VITE PO) Take 1 capsule by mouth daily at 6 (six) AM. For wound healing    [provider]  Multiple Vitamins-Minerals (MULTIVITAMIN WOMEN 50+) TABS Take 1 tablet by mouth daily with breakfast.    [provider]  Neomycin-Bacitracin-Polymyxin (TRIPLE ANTIBIOTIC) OINT Apply topically daily. To skin tears prn    [provider]  oxyCODONE  (OXY IR/ROXICODONE ) 5 MG immediate release tablet Take 1 tablet (5 mg total) by mouth every 6 (six) hours as needed. 03/26/24   [name], NP  pantoprazole  (PROTONIX ) 20 MG tablet Take 1 tablet (20 mg total) by mouth daily. 12/05/23   Ezenduka, Nkeiruka J, MD  potassium chloride  (KLOR-CON ) 20 MEQ packet Take 20 mEq by mouth 2 (two) times daily. 01/27/24   Jeffrie Oneil BROCKS, MD  PROCTOZONE -HC 2.5 % rectal cream PLACE 1 APPLICATION RECTALLY 2 TIMES A DAY AS NEEDED FOR HEMORRHOIDS. Patient not taking: Reported on 03/26/2024 01/09/15   Micheline Setter  D, NP  rosuvastatin  (CRESTOR ) 10 MG tablet Take 10 mg by mouth at bedtime.    [provider]  sodium bicarbonate  650 MG tablet Take 1 tablet (650 mg total) by mouth 2 (two) times daily. 12/08/23   [name], [name], MD  sucralfate  (CARAFATE ) 1 GM/10ML suspension Take 10 mLs (1 g total) by mouth 4 (four) times daily -  with meals and at bedtime. Patient taking differently: Take 1 g by mouth 4 (four) times daily. 12/04/23   Ezenduka, Nkeiruka J, MD  tiZANidine  (ZANAFLEX ) 4 MG tablet Take 4 mg by mouth 2 (two)  times daily as needed for muscle spasms. Patient not taking: Reported on 03/26/2024 10/10/23   [provider]  triamcinolone  (KENALOG) 0.1 % Apply 1 application  topically as needed (for rashes- affected areas). 04/04/20   [provider]  Wound Dressings (SONAFINE) Apply 1 Application topically 2 (two) times daily as needed (Apply to areas of redness from radiation as needed. Call Dr. Charisse nurse at [phone] if you have any questions). 12/04/23   Ezenduka, Nkeiruka J, MD  Zinc  Oxide (BAZA PROTECT MOISTURE BARRIER EX) Apply topically 2 (two) times daily. To buttocks    [provider]    Physical Exam: Vitals:   04/09/24 0851 04/09/24 0900 04/09/24 1015 04/09/24 1221  BP:  (!) 174/93 127/82 (!) 159/65  Pulse:  92 (!) 141 (!) 109  Resp:  (!) 23 15 13   Temp:      TempSrc:      SpO2:  98% 92% 92%  Weight: 47.6 kg     Height: 5' 6 (1.676 m)       Physical Exam Constitutional:      General: She is not in acute distress.    Appearance: Normal appearance.  HENT:     Head: Normocephalic and atraumatic.     Mouth/Throat:     Mouth: Mucous membranes are moist.     Pharynx: Oropharynx is clear.  Eyes:     Extraocular Movements: Extraocular movements intact.     Pupils: Pupils are equal, round, and reactive to light.  Cardiovascular:     Rate and Rhythm: Tachycardia present. Rhythm irregular.     Pulses: Normal pulses.     Heart sounds: Normal heart  sounds.  Pulmonary:     Effort: Pulmonary effort is normal. No respiratory distress.     Breath sounds: Rales present.  Abdominal:     General: Bowel sounds are normal. There is no distension.     Palpations: Abdomen is soft.     Tenderness: There is no abdominal tenderness.  Musculoskeletal:        General: No swelling or deformity.     Right lower leg: Edema present.     Left lower leg: Edema present.  Skin:    General: Skin is warm and dry.  Neurological:     General: No focal deficit present.     Mental Status: Mental status is at baseline.    Labs on Admission: I have personally reviewed following labs and imaging studies  CBC: Recent Labs  Lab 04/09/24 0854  WBC 16.1*  HGB 11.0*  HCT 35.1*  MCV 94.9  PLT 243    Basic Metabolic Panel: Recent Labs  Lab 04/09/24 0854  NA 137  K 5.0  CL 109  CO2 20*  GLUCOSE 124*  BUN 35*  CREATININE 1.57*  CALCIUM  9.2  MG 1.3*    GFR: Estimated Creatinine Clearance: 19 mL/min (A) (by C-G formula based on SCr of 1.57 mg/dL (H)).  Liver Function Tests: Recent Labs  Lab 04/09/24 0854  AST 34  ALT 50*  ALKPHOS 131*  BILITOT 0.9  PROT 6.3*  ALBUMIN  3.1*    Urine analysis:    Component Value Date/Time   COLORURINE YELLOW 11/21/2023 1840   APPEARANCEUR CLEAR 11/21/2023 1840   LABSPEC 1.016 11/21/2023 1840   PHURINE 5.0 11/21/2023 1840   GLUCOSEU NEGATIVE 11/21/2023 1840   HGBUR NEGATIVE 11/21/2023 1840   BILIRUBINUR NEGATIVE 11/21/2023 1840   BILIRUBINUR n 09/15/2015 1123   KETONESUR NEGATIVE 11/21/2023 1840   PROTEINUR  NEGATIVE 11/21/2023 1840   UROBILINOGEN negative 09/15/2015 1123   UROBILINOGEN 0.2 04/11/2009 1015   NITRITE NEGATIVE 11/21/2023 1840   LEUKOCYTESUR NEGATIVE 11/21/2023 1840    Radiological Exams on Admission: CT Angio Chest PE W and/or Wo Contrast Result Date: 04/09/2024 CLINICAL DATA:  MC-CTPulmonary embolism (PE) suspected, high prob history of lung cancer. Sternal chest pain LEFT  shoulder pain. * Tracking Code: BO * EXAM: CT ANGIOGRAPHY CHEST WITH CONTRAST TECHNIQUE: Multidetector CT imaging of the chest was performed using the standard protocol during bolus administration of intravenous contrast. Multiplanar CT image reconstructions and MIPs were obtained to evaluate the vascular anatomy. RADIATION DOSE REDUCTION: This exam was performed according to the departmental dose-optimization program which includes automated exposure control, adjustment of the mA and/or kV according to patient size and/or use of iterative reconstruction technique. CONTRAST:  60mL OMNIPAQUE  IOHEXOL  350 MG/ML SOLN COMPARISON:  CT chest 03/25/2024 FINDINGS: Cardiovascular: No filling defects within the pulmonary arteries to suggest acute pulmonary embolism. No significant vascular findings. Normal heart size. No pericardial effusion. Mediastinum/Nodes: No axillary or supraclavicular adenopathy. No mediastinal or hilar adenopathy. No pericardial fluid. Esophagus normal. Lungs/Pleura: Bilateral pleural effusions similar prior. Moderate effusion on the RIGHT and small on the LEFT. There is passive atelectasis of the RIGHT lower lobe. Small subcentimeter nodules in the lung base not changed from recent CT. Increased consolidation at the LEFT lung base (image 259/series 7 and 90/series 6. Upper Abdomen: Hepatic metastasis not well appreciated no acute findings Musculoskeletal: Sclerotic skeletal metastasis again noted. No change. Surgical clips in the lateral RIGHT breast Review of the MIP images confirms the above findings. IMPRESSION: 1. No evidence acute pulmonary embolism. 2. Bilateral pleural effusions with passive atelectasis of the RIGHT lower lobe. 3. Increased consolidation at the LEFT lung base. Concern for LEFT lower lobe pneumonia. 4. Stable small pulmonary nodules at the lung bases. 5. Stable skeletal metastasis. Electronically Signed   By: Jackquline Boxer M.D.   On: 04/09/2024 11:23   DG Chest Port 1  View Result Date: 04/09/2024 CLINICAL DATA:  Chest pain EXAM: PORTABLE CHEST 1 VIEW COMPARISON:  01/01/2024 FINDINGS: Low volume film. The cardio pericardial silhouette is enlarged. Vascular congestion without overt airspace pulmonary edema. Bibasilar atelectasis/infiltrate is similar in the interval with small bilateral pleural effusions. No acute bony abnormality. Telemetry leads overlie the chest. IMPRESSION: 1. Low volume film with vascular congestion and small bilateral pleural effusions. 2. Bibasilar atelectasis/infiltrate is similar in the interval. Electronically Signed   By: Camellia Candle M.D.   On: 04/09/2024 08:57   EKG: Independently reviewed.  Atrial fibrillation versus atrial flutter at 89 bpm.  Nonspecific T wave changes.  Some baseline wander.  Assessment/Plan Active Problems:   Hypertension   Hyperlipidemia   GERD (gastroesophageal reflux disease)   CAD (coronary artery disease)   Chronic anemia   Chronic kidney disease, stage 3b (HCC)   Diastolic CHF, chronic (HCC)   Atrial fibrillation with RVR (HCC)   Atrial fibrillation with RVR > Patient presenting with chest pain and noted to be in A-fib with RVR in the 130s to 140s, now improving with diltiazem  infusion. > Not on anticoagulation due to concerns about her chronic anemia, frailty, metastatic cancer - Monitor on progressive unit overnight - Continue with diltiazem  infusion - Will be on prophylaxis dose anticoagulation only for now - Will continue with home metoprolol  but plan to resume home diltiazem  tomorrow as patient comes off infusion - Echocardiogram  Pneumonia > CT imaging in the ED showed  evidence of left lower lobe pneumonia and patient noted to have leukocytosis to 16.1.  Possible reactive component but with imaging findings patient was started on ceftriaxone  and azithromycin . - Continue with ceftriaxone  azithromycin  - Trend fever curve and WBC  Chronic diastolic CHF Rule out exacerbation > EDP with some  concern for volume overload in the ED.  CT did not demonstrate significant evidence of this.  No BNP thus far. > Patient did receive one-time dose of IV Lasix  in the ED so far. > Last echo was in February of this year with EF 65-70%, indeterminate diastolic function, normal RV function. - Check BNP - Strict I's and O's, daily weights - Will be getting repeat echocardiogram with this recurrent A-fib with RVR - Continue metoprolol , Imdur , Lasix  - Consider IV Lasix  as needed Addendum > BNP elevated to 950 and exam with rales and lower extremity edema.  Will continue with IV Lasix  for now.  Hypomagnesemia > Recurrent issue for patient thought to be secondary to her cancer treatment. - Received 2 g IV magnesium  in the ED - Continue with home p.o. magnesium  - Trend magnesium   CKD 3B ?AKI > Creatinine currently elevated at 1.57 which is stable for the past month but increased from 1 3 months ago.  Notes indicate this could be secondary to her cancer treatment. - Continue to trend renal function and electrolytes  Hypertension - Continue home metoprolol  and Lasix  - Continue home Imdur   Hyperlipidemia - Continue rosuvastatin   GERD - Continue home PPI  Anemia > Hemoglobin stable at 11 in the ED - Trend CBC  History of breast and ovarian cancer Current metastatic breast cancer > Metastatic disease to liver, lungs, spine. > Follows with oncology and currently on single agent therapy with Faslodex   DVT prophylaxis: Heparin  Code Status:   DNR/DNI  Family Communication:  None on admission  Disposition Plan:   Patient is from:  Home  Anticipated DC to:  Home  Anticipated DC date:  1 to 3 days  Anticipated DC barriers: None  Consults called:  None Admission status:  Observation, progressive  Severity of Illness: The appropriate patient status for this patient is OBSERVATION. Observation status is judged to be reasonable and necessary in order to provide the required intensity of  service to ensure the patient's safety. The patient's presenting symptoms, physical exam findings, and initial radiographic and laboratory data in the context of their medical condition is felt to place them at decreased risk for further clinical deterioration. Furthermore, it is anticipated that the patient will be medically stable for discharge from the hospital within 2 midnights of admission.    Marsa KATHEE Scurry MD [facility]  How to contact the [facility] Attending or Consulting provider 7A - 7P or covering provider during after hours 7P -7A, for this patient?   Check the care team in [facility] and look for a) attending/consulting [facility] provider listed and b) the [facility] team listed Log into [email or web address] and use [place]'s universal password to access. If you do not have the password, please contact the hospital operator. Locate the [facility] provider you are looking for under [facility] and page to a number that you can be directly reached. If you still have difficulty reaching the provider, please page the [facility] (Director on Call) for the Hospitalists listed on amion for assistance.  04/09/2024, 12:58 PM

## 2024-04-09 NOTE — Plan of Care (Signed)
  Problem: Education: Goal: Knowledge of General Education information will improve Description: Including pain rating scale, medication(s)/side effects and non-pharmacologic comfort measures Outcome: Progressing   Problem: Health Behavior/Discharge Planning: Goal: Ability to manage health-related needs will improve Outcome: Progressing   Problem: Clinical Measurements: Goal: Ability to maintain clinical measurements within normal limits will improve Outcome: Progressing Goal: Will remain free from infection Outcome: Progressing Goal: Diagnostic test results will improve Outcome: Progressing Goal: Respiratory complications will improve Outcome: Progressing Goal: Cardiovascular complication will be avoided Outcome: Progressing   Problem: Activity: Goal: Risk for activity intolerance will decrease Outcome: Progressing   Problem: Nutrition: Goal: Adequate nutrition will be maintained Outcome: Progressing   Problem: Coping: Goal: Level of anxiety will decrease Outcome: Progressing   Problem: Elimination: Goal: Will not experience complications related to bowel motility Outcome: Progressing Goal: Will not experience complications related to urinary retention Outcome: Progressing   Problem: Pain Managment: Goal: General experience of comfort will improve and/or be controlled Outcome: Progressing   Problem: Safety: Goal: Ability to remain free from injury will improve Outcome: Progressing   Problem: Skin Integrity: Goal: Risk for impaired skin integrity will decrease Outcome: Progressing   Problem: Activity: Goal: Ability to tolerate increased activity will improve Outcome: Progressing   Problem: Clinical Measurements: Goal: Ability to maintain a body temperature in the normal range will improve Outcome: Progressing   Problem: Respiratory: Goal: Ability to maintain adequate ventilation will improve Outcome: Progressing Goal: Ability to maintain a clear airway  will improve Outcome: Progressing   Problem: Education: Goal: Knowledge of disease or condition will improve Outcome: Progressing Goal: Understanding of medication regimen will improve Outcome: Progressing Goal: Individualized Educational Video(s) Outcome: Progressing   Problem: Activity: Goal: Ability to tolerate increased activity will improve Outcome: Progressing   Problem: Cardiac: Goal: Ability to achieve and maintain adequate cardiopulmonary perfusion will improve Outcome: Progressing   Problem: Health Behavior/Discharge Planning: Goal: Ability to safely manage health-related needs after discharge will improve Outcome: Progressing

## 2024-04-10 DIAGNOSIS — Z85828 Personal history of other malignant neoplasm of skin: Secondary | ICD-10-CM | POA: Diagnosis not present

## 2024-04-10 DIAGNOSIS — Z888 Allergy status to other drugs, medicaments and biological substances status: Secondary | ICD-10-CM | POA: Diagnosis not present

## 2024-04-10 DIAGNOSIS — I4819 Other persistent atrial fibrillation: Secondary | ICD-10-CM | POA: Diagnosis present

## 2024-04-10 DIAGNOSIS — D630 Anemia in neoplastic disease: Secondary | ICD-10-CM | POA: Diagnosis present

## 2024-04-10 DIAGNOSIS — Z7982 Long term (current) use of aspirin: Secondary | ICD-10-CM | POA: Diagnosis not present

## 2024-04-10 DIAGNOSIS — Z66 Do not resuscitate: Secondary | ICD-10-CM | POA: Diagnosis present

## 2024-04-10 DIAGNOSIS — I130 Hypertensive heart and chronic kidney disease with heart failure and stage 1 through stage 4 chronic kidney disease, or unspecified chronic kidney disease: Secondary | ICD-10-CM | POA: Diagnosis present

## 2024-04-10 DIAGNOSIS — Z9049 Acquired absence of other specified parts of digestive tract: Secondary | ICD-10-CM | POA: Diagnosis not present

## 2024-04-10 DIAGNOSIS — Z79899 Other long term (current) drug therapy: Secondary | ICD-10-CM | POA: Diagnosis not present

## 2024-04-10 DIAGNOSIS — E785 Hyperlipidemia, unspecified: Secondary | ICD-10-CM | POA: Diagnosis present

## 2024-04-10 DIAGNOSIS — C787 Secondary malignant neoplasm of liver and intrahepatic bile duct: Secondary | ICD-10-CM | POA: Diagnosis present

## 2024-04-10 DIAGNOSIS — K219 Gastro-esophageal reflux disease without esophagitis: Secondary | ICD-10-CM | POA: Diagnosis present

## 2024-04-10 DIAGNOSIS — Z83438 Family history of other disorder of lipoprotein metabolism and other lipidemia: Secondary | ICD-10-CM | POA: Diagnosis not present

## 2024-04-10 DIAGNOSIS — Z853 Personal history of malignant neoplasm of breast: Secondary | ICD-10-CM | POA: Diagnosis not present

## 2024-04-10 DIAGNOSIS — C7951 Secondary malignant neoplasm of bone: Secondary | ICD-10-CM | POA: Diagnosis present

## 2024-04-10 DIAGNOSIS — N1832 Chronic kidney disease, stage 3b: Secondary | ICD-10-CM | POA: Diagnosis present

## 2024-04-10 DIAGNOSIS — I2510 Atherosclerotic heart disease of native coronary artery without angina pectoris: Secondary | ICD-10-CM | POA: Diagnosis present

## 2024-04-10 DIAGNOSIS — Z8543 Personal history of malignant neoplasm of ovary: Secondary | ICD-10-CM | POA: Diagnosis not present

## 2024-04-10 DIAGNOSIS — R54 Age-related physical debility: Secondary | ICD-10-CM | POA: Diagnosis present

## 2024-04-10 DIAGNOSIS — J189 Pneumonia, unspecified organism: Secondary | ICD-10-CM | POA: Diagnosis present

## 2024-04-10 DIAGNOSIS — Z961 Presence of intraocular lens: Secondary | ICD-10-CM | POA: Diagnosis present

## 2024-04-10 DIAGNOSIS — I5033 Acute on chronic diastolic (congestive) heart failure: Secondary | ICD-10-CM | POA: Diagnosis present

## 2024-04-10 DIAGNOSIS — Z8249 Family history of ischemic heart disease and other diseases of the circulatory system: Secondary | ICD-10-CM | POA: Diagnosis not present

## 2024-04-10 LAB — COMPREHENSIVE METABOLIC PANEL WITH GFR
ALT: 62 U/L — ABNORMAL HIGH (ref 0–44)
AST: 44 U/L — ABNORMAL HIGH (ref 15–41)
Albumin: 2.7 g/dL — ABNORMAL LOW (ref 3.5–5.0)
Alkaline Phosphatase: 133 U/L — ABNORMAL HIGH (ref 38–126)
Anion gap: 9 (ref 5–15)
BUN: 37 mg/dL — ABNORMAL HIGH (ref 8–23)
CO2: 18 mmol/L — ABNORMAL LOW (ref 22–32)
Calcium: 8.6 mg/dL — ABNORMAL LOW (ref 8.9–10.3)
Chloride: 105 mmol/L (ref 98–111)
Creatinine, Ser: 1.77 mg/dL — ABNORMAL HIGH (ref 0.44–1.00)
GFR, Estimated: 27 mL/min — ABNORMAL LOW (ref >60)
Glucose, Bld: 168 mg/dL — ABNORMAL HIGH (ref 70–99)
Potassium: 4.6 mmol/L (ref 3.5–5.1)
Sodium: 132 mmol/L — ABNORMAL LOW (ref 135–145)
Total Bilirubin: 1 mg/dL (ref 0.0–1.2)
Total Protein: 6 g/dL — ABNORMAL LOW (ref 6.5–8.1)

## 2024-04-10 LAB — CBC
HCT: 33 % — ABNORMAL LOW (ref 36.0–46.0)
Hemoglobin: 10.5 g/dL — ABNORMAL LOW (ref 12.0–15.0)
MCH: 29 pg (ref 26.0–34.0)
MCHC: 31.8 g/dL (ref 30.0–36.0)
MCV: 91.2 fL (ref 80.0–100.0)
Platelets: 207 K/uL (ref 150–400)
RBC: 3.62 MIL/uL — ABNORMAL LOW (ref 3.87–5.11)
RDW: 15.6 % — ABNORMAL HIGH (ref 11.5–15.5)
WBC: 20.8 K/uL — ABNORMAL HIGH (ref 4.0–10.5)
nRBC: 0 % (ref 0.0–0.2)

## 2024-04-10 LAB — MAGNESIUM: Magnesium: 1.8 mg/dL (ref 1.7–2.4)

## 2024-04-10 MED ORDER — ISOSORBIDE MONONITRATE ER 30 MG PO TB24: 30.0000 mg | ORAL_TABLET | Freq: Every day | ORAL | Status: DC

## 2024-04-10 MED ORDER — DILTIAZEM HCL ER COATED BEADS 180 MG PO CP24
360.0000 mg | ORAL_CAPSULE | Freq: Every day | ORAL | Status: DC
  Administered 2024-04-10 – 2024-04-12 (×3): 360 mg via ORAL
  Filled 2024-04-10 (×3): qty 2

## 2024-04-10 NOTE — Progress Notes (Signed)
 PROGRESS NOTE    [name]  FMW:[id number] DOB: 12-27-1933 DOA: 04/09/2024 PCP: Elliot Charm, MD  89/F with history of widely metastatic breast cancer, and ovarian cancer chronic diastolic CHF sup, CKD 3B, persistent atrial fibrillation, hypertension, GERD, CAD, chronic anemia presented to the ED 7/4 with chest pain, substernal, she also noted some ongoing dyspnea.  In the ED, tachycardic in A-fib RVR, labs noted creatinine of 1.6, WBC 16, hemoglobin 11, troponin normal, D-dimer 3.5, chest x-ray noted pulmonary vascular congestion, small pleural effusions, CTA chest was negative for PE, noted bilateral pleural effusions as well as left lower lobe consolidation, stable nodules and sclerotic skeletal metastasis - Admitted, started on Cardizem  gtt., IV Lasix , antibiotics  Subjective: -Feels a little better today breathing starting to improve, chest pain is better  Assessment and Plan:  Atrial fibrillation with RVR - Currently on Cardizem  gtt. - She is not on anticoagulation at baseline on account of frailty, fall risk, chronic anemia, metastatic cancer - Stop Cardizem  gtt. and resume oral Cardizem  CD per home regimen, continue metoprolol  - Per cards notes she has persistent A-fib so we will focus on rate control  Acute on chronic diastolic CHF - Clinically volume overloaded, suspect low albumin  also contributing to third spacing - Echo last year with preserved EF, indeterminate diastolic function, repeat echo ordered on admission will follow-up - Continue IV Lasix  today, metoprolol , Imdur  - GDMT limited by CKD, monitor  Left lower lobe pneumonia, leukocytosis - Continue IV ceftriaxone  and azithromycin , follow-up cultures - Clinically no symptoms suggestive of aspiration  CKD 3B - Stable, monitor with diuresis  Metastatic breast cancer and history of ovarian cancer - Followed by Dr. Cloretta, currently on Faslodex  - Appears to have advanced diffuse disease  DVT  prophylaxis: Heparin  subcutaneous Code Status: DNR Family Communication: Discussed with patient detail Disposition Plan: Home likely 48 hours  Consultants:    Procedures:   Antimicrobials:    Objective: Vitals:   04/10/24 0057 04/10/24 0530 04/10/24 0603 04/10/24 0822  BP: (!) 140/72 (!) 151/65  135/86  Pulse: 72 73 76   Resp: 15 16  15   Temp: 98.6 F (37 C) 98.6 F (37 C)  98.1 F (36.7 C)  TempSrc: Oral Oral  Oral  SpO2: 96% 94% 94%   Weight:   50.7 kg   Height:        Intake/Output Summary (Last 24 hours) at 04/10/2024 1031 Last data filed at 04/10/2024 0400 Gross per 24 hour  Intake 123 ml  Output --  Net 123 ml   Filed Weights   04/09/24 0851 04/09/24 1428 04/10/24 0603  Weight: 47.6 kg 49.9 kg 50.7 kg    Examination:  General exam: Chronically ill frail sitting up in bed, AAO x 3 Respiratory system: Decreased breath sounds at the bases Cardiovascular system: S1 & S2 heard, irregular Abd: nondistended, soft and nontender.Normal bowel sounds heard. Central nervous system: Alert and oriented. No focal neurological deficits. Extremities: 2+ edema Skin: No rashes Psychiatry:  Mood & affect appropriate.     Data Reviewed:   CBC: Recent Labs  Lab 04/09/24 0854 04/10/24 0224  WBC 16.1* 20.8*  HGB 11.0* 10.5*  HCT 35.1* 33.0*  MCV 94.9 91.2  PLT 243 207   Basic Metabolic Panel: Recent Labs  Lab 04/09/24 0854 04/10/24 0224  NA 137 132*  K 5.0 4.6  CL 109 105  CO2 20* 18*  GLUCOSE 124* 168*  BUN 35* 37*  CREATININE 1.57* 1.77*  CALCIUM  9.2 8.6*  MG 1.3* 1.8   GFR: Estimated Creatinine Clearance: 17.9 mL/min (A) (by C-G formula based on SCr of 1.77 mg/dL (H)). Liver Function Tests: Recent Labs  Lab 04/09/24 0854 04/10/24 0224  AST 34 44*  ALT 50* 62*  ALKPHOS 131* 133*  BILITOT 0.9 1.0  PROT 6.3* 6.0*  ALBUMIN  3.1* 2.7*   No results for input(s): LIPASE, AMYLASE in the last 168 hours. No results for input(s): AMMONIA in the  last 168 hours. Coagulation Profile: No results for input(s): INR, PROTIME in the last 168 hours. Cardiac Enzymes: No results for input(s): CKTOTAL, CKMB, CKMBINDEX, TROPONINI in the last 168 hours. BNP (last 3 results) No results for input(s): PROBNP in the last 8760 hours. HbA1C: No results for input(s): HGBA1C in the last 72 hours. CBG: No results for input(s): GLUCAP in the last 168 hours. Lipid Profile: No results for input(s): CHOL, HDL, LDLCALC, TRIG, CHOLHDL, LDLDIRECT in the last 72 hours. Thyroid  Function Tests: No results for input(s): TSH, T4TOTAL, FREET4, T3FREE, THYROIDAB in the last 72 hours. Anemia Panel: No results for input(s): VITAMINB12, FOLATE, FERRITIN, TIBC, IRON, RETICCTPCT in the last 72 hours. Urine analysis:    Component Value Date/Time   COLORURINE YELLOW 11/21/2023 1840   APPEARANCEUR CLEAR 11/21/2023 1840   LABSPEC 1.016 11/21/2023 1840   PHURINE 5.0 11/21/2023 1840   GLUCOSEU NEGATIVE 11/21/2023 1840   HGBUR NEGATIVE 11/21/2023 1840   BILIRUBINUR NEGATIVE 11/21/2023 1840   BILIRUBINUR n 09/15/2015 1123   KETONESUR NEGATIVE 11/21/2023 1840   PROTEINUR NEGATIVE 11/21/2023 1840   UROBILINOGEN negative 09/15/2015 1123   UROBILINOGEN 0.2 04/11/2009 1015   NITRITE NEGATIVE 11/21/2023 1840   LEUKOCYTESUR NEGATIVE 11/21/2023 1840   Sepsis Labs: @LABRCNTIP (procalcitonin:4,lacticidven:4)  ) Recent Results (from the past 240 hours)  Culture, blood (routine x 2)     Status: None (Preliminary result)   Collection Time: 04/09/24  1:35 PM   Specimen: BLOOD RIGHT ARM  Result Value Ref Range Status   Specimen Description BLOOD RIGHT ARM  Final   Special Requests   Final    BOTTLES DRAWN AEROBIC AND ANAEROBIC Blood Culture results may not be optimal due to an inadequate volume of blood received in culture bottles   Culture   Final    NO GROWTH < 24 HOURS Performed at [facility], [address] [place], [place] [address]    Report Status PENDING  Incomplete  Culture, blood (routine x 2)     Status: None (Preliminary result)   Collection Time: 04/09/24  1:40 PM   Specimen: BLOOD RIGHT HAND  Result Value Ref Range Status   Specimen Description BLOOD RIGHT HAND  Final   Special Requests   Final    BOTTLES DRAWN AEROBIC ONLY Blood Culture results may not be optimal due to an inadequate volume of blood received in culture bottles   Culture   Final    NO GROWTH < 24 HOURS Performed at [facility], [address] [place], [place] [address]    Report Status PENDING  Incomplete     Radiology Studies: ECHOCARDIOGRAM COMPLETE Result Date: 04/09/2024    ECHOCARDIOGRAM REPORT   Patient Name:   BRANTLEY NASER Date of Exam: 04/09/2024 Medical Rec #:  [id number]        Height:       66.0 in Accession #:    [id number]       Weight:       109.9 lb Date of Birth:  1934-01-03  BSA:          1.551 m Patient Age:    89 years         BP:           154/66 mmHg Patient Gender: F                HR:           99 bpm. Exam Location:  Inpatient Procedure: 2D Echo, Cardiac Doppler and Color Doppler (Both Spectral and Color            Flow Doppler were utilized during procedure). Indications:    Atrial Fibrillation  History:        Patient has prior history of Echocardiogram examinations, most                 recent 12/24/2023. CHF, CAD, Arrythmias:Atrial Fibrillation; Risk                 Factors:Hypertension.  Sonographer:    Philomena Daring Referring Phys: [id number] MARSA NOVAK MELVIN IMPRESSIONS  1. Left ventricular ejection fraction, by estimation, is 60 to 65%. The left ventricle has normal function. The left ventricle has no regional wall motion abnormalities. There is moderate asymmetric left ventricular hypertrophy of the basal and septal segments. Left ventricular diastolic function could not be evaluated.  2. Right ventricular systolic function is normal. The right ventricular size is normal. Tricuspid  regurgitation signal is inadequate for assessing PA pressure.  3. Left atrial size was severely dilated.  4. Right atrial size was mild to moderately dilated.  5. The mitral valve is abnormal. No evidence of mitral valve regurgitation. No evidence of mitral stenosis. Moderate to severe mitral annular calcification.  6. The aortic valve is tricuspid. Aortic valve regurgitation is mild. No aortic stenosis is present.  7. The inferior vena cava is dilated in size with >50% respiratory variability, suggesting right atrial pressure of 8 mmHg. Comparison(s): Changes from prior study are noted. AR is moderate on prior study. FINDINGS  Left Ventricle: Left ventricular ejection fraction, by estimation, is 60 to 65%. The left ventricle has normal function. The left ventricle has no regional wall motion abnormalities. Strain was performed and the global longitudinal strain is indeterminate. The left ventricular internal cavity size was normal in size. There is moderate asymmetric left ventricular hypertrophy of the basal and septal segments. Left ventricular diastolic function could not be evaluated due to atrial fibrillation. Left ventricular diastolic function could not be evaluated. Right Ventricle: The right ventricular size is normal. No increase in right ventricular wall thickness. Right ventricular systolic function is normal. Tricuspid regurgitation signal is inadequate for assessing PA pressure. Left Atrium: Left atrial size was severely dilated. Right Atrium: Right atrial size was mild to moderately dilated. Pericardium: There is no evidence of pericardial effusion. Mitral Valve: The mitral valve is abnormal. Moderate to severe mitral annular calcification. No evidence of mitral valve regurgitation. No evidence of mitral valve stenosis. Tricuspid Valve: The tricuspid valve is normal in structure. Tricuspid valve regurgitation is mild . No evidence of tricuspid stenosis. Aortic Valve: The aortic valve is tricuspid.  Aortic valve regurgitation is mild. No aortic stenosis is present. Pulmonic Valve: The pulmonic valve was grossly normal. Pulmonic valve regurgitation is not visualized. No evidence of pulmonic stenosis. Aorta: The aortic root and ascending aorta are structurally normal, with no evidence of dilitation. Venous: The inferior vena cava is dilated in size with greater than 50% respiratory variability, suggesting right atrial pressure  of 8 mmHg. IAS/Shunts: The interatrial septum was not well visualized. Additional Comments: 3D was performed not requiring image post processing on an independent workstation and was indeterminate.  LEFT VENTRICLE PLAX 2D LVIDd:         3.80 cm   Diastology LVIDs:         2.50 cm   LV e' medial:    4.57 cm/s LV PW:         1.00 cm   LV E/e' medial:  26.8 LV IVS:        1.10 cm   LV e' lateral:   8.27 cm/s LVOT diam:     1.80 cm   LV E/e' lateral: 14.8 LV SV:         58 LV SV Index:   37 LVOT Area:     2.54 cm  RIGHT VENTRICLE            IVC RV S prime:     7.83 cm/s  IVC diam: 2.10 cm TAPSE (M-mode): 1.3 cm LEFT ATRIUM             Index        RIGHT ATRIUM           Index LA Vol (A2C):   79.0 ml 50.95 ml/m  RA Area:     17.50 cm LA Vol (A4C):   69.2 ml 44.63 ml/m  RA Volume:   48.70 ml  31.41 ml/m LA Biplane Vol: 80.0 ml 51.60 ml/m  AORTIC VALVE LVOT Vmax:   100.00 cm/s LVOT Vmean:  69.500 cm/s LVOT VTI:    0.227 m  AORTA Ao Root diam: 2.50 cm Ao Asc diam:  3.00 cm MITRAL VALVE MV Area (PHT): 4.21 cm     SHUNTS MV Decel Time: 180 msec     Systemic VTI:  0.23 m MV E velocity: 122.50 cm/s  Systemic Diam: 1.80 cm MV A velocity: 104.00 cm/s MV E/A ratio:  1.18 Vishnu Priya Mallipeddi Electronically signed by Diannah Late Mallipeddi Signature Date/Time: 04/09/2024/4:07:35 PM    Final    CT Angio Chest PE W and/or Wo Contrast Result Date: 04/09/2024 CLINICAL DATA:  MC-CTPulmonary embolism (PE) suspected, high prob history of lung cancer. Sternal chest pain LEFT shoulder pain. * Tracking  Code: BO * EXAM: CT ANGIOGRAPHY CHEST WITH CONTRAST TECHNIQUE: Multidetector CT imaging of the chest was performed using the standard protocol during bolus administration of intravenous contrast. Multiplanar CT image reconstructions and MIPs were obtained to evaluate the vascular anatomy. RADIATION DOSE REDUCTION: This exam was performed according to the departmental dose-optimization program which includes automated exposure control, adjustment of the mA and/or kV according to patient size and/or use of iterative reconstruction technique. CONTRAST:  60mL OMNIPAQUE  IOHEXOL  350 MG/ML SOLN COMPARISON:  CT chest 03/25/2024 FINDINGS: Cardiovascular: No filling defects within the pulmonary arteries to suggest acute pulmonary embolism. No significant vascular findings. Normal heart size. No pericardial effusion. Mediastinum/Nodes: No axillary or supraclavicular adenopathy. No mediastinal or hilar adenopathy. No pericardial fluid. Esophagus normal. Lungs/Pleura: Bilateral pleural effusions similar prior. Moderate effusion on the RIGHT and small on the LEFT. There is passive atelectasis of the RIGHT lower lobe. Small subcentimeter nodules in the lung base not changed from recent CT. Increased consolidation at the LEFT lung base (image 259/series 7 and 90/series 6. Upper Abdomen: Hepatic metastasis not well appreciated no acute findings Musculoskeletal: Sclerotic skeletal metastasis again noted. No change. Surgical clips in the lateral RIGHT breast Review of the MIP images confirms  the above findings. IMPRESSION: 1. No evidence acute pulmonary embolism. 2. Bilateral pleural effusions with passive atelectasis of the RIGHT lower lobe. 3. Increased consolidation at the LEFT lung base. Concern for LEFT lower lobe pneumonia. 4. Stable small pulmonary nodules at the lung bases. 5. Stable skeletal metastasis. Electronically Signed   By: Jackquline Boxer M.D.   On: 04/09/2024 11:23   DG Chest Port 1 View Result Date:  04/09/2024 CLINICAL DATA:  Chest pain EXAM: PORTABLE CHEST 1 VIEW COMPARISON:  01/01/2024 FINDINGS: Low volume film. The cardio pericardial silhouette is enlarged. Vascular congestion without overt airspace pulmonary edema. Bibasilar atelectasis/infiltrate is similar in the interval with small bilateral pleural effusions. No acute bony abnormality. Telemetry leads overlie the chest. IMPRESSION: 1. Low volume film with vascular congestion and small bilateral pleural effusions. 2. Bibasilar atelectasis/infiltrate is similar in the interval. Electronically Signed   By: Camellia Candle M.D.   On: 04/09/2024 08:57     Scheduled Meds:  aspirin  EC  81 mg Oral Daily   diltiazem   360 mg Oral Daily   furosemide   40 mg Intravenous BID   heparin   5,000 Units Subcutaneous Q8H   isosorbide  mononitrate  60 mg Oral Daily   magnesium  oxide  800 mg Oral BID   metoprolol  tartrate  50 mg Oral BID   pantoprazole   20 mg Oral Daily   rosuvastatin   10 mg Oral QHS   sodium chloride  flush  3 mL Intravenous Q12H   Continuous Infusions:  azithromycin  500 mg (04/10/24 0915)   cefTRIAXone  (ROCEPHIN )  IV       LOS: 0 days    Time spent:    Sigurd Pac, MD [facility]   04/10/2024, 10:31 AM

## 2024-04-10 NOTE — Progress Notes (Signed)
 Patient is AXOX4 with no clinical signs of distress or complaints of pain at this time. Patient remains on continuous fluids (see MAR) with plans for transition to PO Cardizem  next shift.  Patient has weakness in lower extremities however, patient was able to ambulate to [facility] with assistance.  Safety measures are in place, call light is within reach, and 4P's addressed.

## 2024-04-11 DIAGNOSIS — J189 Pneumonia, unspecified organism: Secondary | ICD-10-CM | POA: Diagnosis not present

## 2024-04-11 LAB — CBC
HCT: 30 % — ABNORMAL LOW (ref 36.0–46.0)
Hemoglobin: 9.8 g/dL — ABNORMAL LOW (ref 12.0–15.0)
MCH: 29.9 pg (ref 26.0–34.0)
MCHC: 32.7 g/dL (ref 30.0–36.0)
MCV: 91.5 fL (ref 80.0–100.0)
Platelets: 224 K/uL (ref 150–400)
RBC: 3.28 MIL/uL — ABNORMAL LOW (ref 3.87–5.11)
RDW: 15.7 % — ABNORMAL HIGH (ref 11.5–15.5)
WBC: 10.7 K/uL — ABNORMAL HIGH (ref 4.0–10.5)
nRBC: 0 % (ref 0.0–0.2)

## 2024-04-11 LAB — BASIC METABOLIC PANEL WITH GFR
Anion gap: 13 (ref 5–15)
BUN: 42 mg/dL — ABNORMAL HIGH (ref 8–23)
CO2: 20 mmol/L — ABNORMAL LOW (ref 22–32)
Calcium: 8.6 mg/dL — ABNORMAL LOW (ref 8.9–10.3)
Chloride: 104 mmol/L (ref 98–111)
Creatinine, Ser: 2.26 mg/dL — ABNORMAL HIGH (ref 0.44–1.00)
GFR, Estimated: 20 mL/min — ABNORMAL LOW (ref >60)
Glucose, Bld: 122 mg/dL — ABNORMAL HIGH (ref 70–99)
Potassium: 3.5 mmol/L (ref 3.5–5.1)
Sodium: 137 mmol/L (ref 135–145)

## 2024-04-11 MED ORDER — ISOSORBIDE MONONITRATE ER 30 MG PO TB24
15.0000 mg | ORAL_TABLET | Freq: Every day | ORAL | Status: DC
  Administered 2024-04-11 – 2024-04-12 (×2): 15 mg via ORAL
  Filled 2024-04-11 (×2): qty 1

## 2024-04-11 NOTE — Evaluation (Signed)
 Physical Therapy Evaluation Patient Details Name: [name] MRN: [id number] DOB: 1934-07-17 Today's Date: 04/11/2024  History of Present Illness  The pt is an 89 yo female presenting 7/4 with chest pain and SOB. CTA chest showed bilateral pleural effusions and left lower lobe consolidation suggestive of PNA. PMH includes: stage IV breast cancer (widely metastatic to lung, liver, spine, ribs, peritoneum) which she is going through treatment for currently, PAF not on AC, chronic HFpEF, HTN, CKD stage IIIb, anemia, HTN, recent admission 2/28 -3/04 for new onset PAF with RVR.   Clinical Impression  Pt in bed upon arrival of PT, agreeable to evaluation at this time. Prior to admission the pt was ambulating without use of DME, reports limited in endurance to ~10-7ft within her home. Pt reports independence with ADLs and some assistance with IADLs after d/c from SNF. The pt was able to complete sit-stand transfers with minA-CGA this session, progressing within session to CGA, and was able to complete 175 ft hallway ambulation with CGA-minA without use of DME or UE support. The pt progressively needed increased assist with fatigue, and will benefit from use of RW at home to improve safety and independence, pt agreeable with plan. Will also benefit from HHPT to progress endurance and safety in the home as well as OT assessment of capacity for ADL management.      If plan is discharge home, recommend the following: A little help with walking and/or transfers;A little help with bathing/dressing/bathroom;Assistance with cooking/housework;Direct supervision/assist for medications management;Direct supervision/assist for financial management;Assist for transportation;Help with stairs or ramp for entrance   Can travel by private vehicle        Equipment Recommendations BSC/3in1  Recommendations for Other Services  OT consult    Functional Status Assessment Patient has had a recent decline in their  functional status and demonstrates the ability to make significant improvements in function in a reasonable and predictable amount of time.     Precautions / Restrictions Precautions Precautions: Fall Recall of Precautions/Restrictions: Intact Precaution/Restrictions Comments: watch HR with exertion Restrictions Weight Bearing Restrictions Per Provider Order: No      Mobility  Bed Mobility Overal bed mobility: Needs Assistance Bed Mobility: Supine to Sit     Supine to sit: Supervision, HOB elevated, Used rails     General bed mobility comments: increased time, no assist given    Transfers Overall transfer level: Needs assistance Equipment used: None Transfers: Sit to/from Stand, Bed to chair/wheelchair/BSC Sit to Stand: Min assist, Contact guard assist   Step pivot transfers: Contact guard assist       General transfer comment: minA initially to steady and with pt reaching for UE support on furniture, progressed to CGA    Ambulation/Gait Ambulation/Gait assistance: Contact guard assist, Min assist Gait Distance (Feet): 175 Feet Assistive device: None Gait Pattern/deviations: Step-through pattern, Decreased stride length, Shuffle, Narrow base of support Gait velocity: decreased Gait velocity interpretation: <1.31 ft/sec, indicative of household ambulator   General Gait Details: pt with small steps and minimal clearance, increased sway and drift with fatigue, then needing minA. HR to 131bpm     Balance Overall balance assessment: Needs assistance Sitting-balance support: No upper extremity supported, Feet supported Sitting balance-Leahy Scale: Fair     Standing balance support: Single extremity supported, No upper extremity supported, During functional activity Standing balance-Leahy Scale: Fair Standing balance comment: initially reaching for UE support, progressed to minA-CGA without UE support. would benefit from RW for improved independence  Pertinent Vitals/Pain Pain Assessment Pain Assessment: No/denies pain    Home Living Family/patient expects to be discharged to:: Private residence Living Arrangements: Alone Available Help at Discharge: Available PRN/intermittently;Family Type of Home: House Home Access: Stairs to enter Entrance Stairs-Rails: Right;Left Entrance Stairs-Number of Steps: 3 Alternate Level Stairs-Number of Steps: 14 with chair lift. Home Layout: Two level;1/2 bath on main level Home Equipment: Grab bars - tub/shower;Rolling Walker (2 wheels) Additional Comments: Chair Lift to second floor with full bathroom and bedroom up. Thinks daughter may have a shower chair from pt's late husband.    Prior Function Prior Level of Function : Driving;Needs assist             Mobility Comments: was using RW at SNF, d/c home without needing DME ADLs Comments: needed assist to bathe and dress, reports managing well at home so far. daughter bought groceries     Extremity/Trunk Assessment   Upper Extremity Assessment Upper Extremity Assessment: Generalized weakness    Lower Extremity Assessment Lower Extremity Assessment: Generalized weakness (grossly 4-/5 to MMT. reports chronic neuropathy in BLE to bottom of shin)    Cervical / Trunk Assessment Cervical / Trunk Assessment: Kyphotic;Other exceptions Cervical / Trunk Exceptions: frail  Communication   Communication Communication: No apparent difficulties    Cognition Arousal: Alert Behavior During Therapy: WFL for tasks assessed/performed   PT - Cognitive impairments: No apparent impairments                         Following commands: Intact       Cueing Cueing Techniques: Verbal cues     General Comments General comments (skin integrity, edema, etc.): HR to 131 with exertion, pt reports this was more activity than she has completed in a while    Exercises     Assessment/Plan    PT Assessment Patient  needs continued PT services  PT Problem List Cardiopulmonary status limiting activity;Decreased activity tolerance;Decreased balance;Decreased mobility       PT Treatment Interventions DME instruction;Gait training;Stair training;Functional mobility training;Therapeutic activities;Therapeutic exercise;Balance training;Patient/family education    PT Goals (Current goals can be found in the Care Plan section)  Acute Rehab PT Goals Patient Stated Goal: return home PT Goal Formulation: With patient Time For Goal Achievement: 04/25/24 Potential to Achieve Goals: Good    Frequency Min 2X/week        AM-PAC PT 6 Clicks Mobility  Outcome Measure Help needed turning from your back to your side while in a flat bed without using bedrails?: None Help needed moving from lying on your back to sitting on the side of a flat bed without using bedrails?: None Help needed moving to and from a bed to a chair (including a wheelchair)?: A Little Help needed standing up from a chair using your arms (e.g., wheelchair or bedside chair)?: A Little Help needed to walk in hospital room?: A Little Help needed climbing 3-5 steps with a railing? : A Lot 6 Click Score: 19    End of Session Equipment Utilized During Treatment: Gait belt Activity Tolerance: Patient tolerated treatment well;Patient limited by fatigue Patient left: in chair;with call bell/phone within reach;with chair alarm set;with family/visitor present Nurse Communication: Mobility status PT Visit Diagnosis: Unsteadiness on feet (R26.81);Other abnormalities of gait and mobility (R26.89);Muscle weakness (generalized) (M62.81)    Time: 8456-8398 PT Time Calculation (min) (ACUTE ONLY): 18 min   Charges:   PT Evaluation $PT Eval Moderate Complexity: 1 Mod   PT General  Charges $$ ACUTE PT VISIT: 1 Visit         Izetta Call, PT, DPT   Acute Rehabilitation Department Office [phone] Secure Chat Communication Preferred  Izetta JULIANNA Call 04/11/2024, 5:57 PM

## 2024-04-11 NOTE — Progress Notes (Signed)
 PROGRESS NOTE    [name]  FMW:[id number] DOB: [birth year] DOA: 04/09/2024 PCP: Elliot Charm, MD  89/F with history of widely metastatic breast cancer, and ovarian cancer chronic diastolic CHF sup, CKD 3B, persistent atrial fibrillation, hypertension, GERD, CAD, chronic anemia presented to the ED 7/4 with chest pain, substernal, she also noted some ongoing dyspnea.  In the ED, tachycardic in A-fib RVR, labs noted creatinine of 1.6, WBC 16, hemoglobin 11, troponin normal, D-dimer 3.5, chest x-ray noted pulmonary vascular congestion, small pleural effusions, CTA chest was negative for PE, noted bilateral pleural effusions as well as left lower lobe consolidation, stable nodules and sclerotic skeletal metastasis - Admitted, started on Cardizem  gtt., IV Lasix , antibiotics  Subjective: - Feels much better overall, breathing continues to improve, cough is better  Assessment and Plan:  Atrial fibrillation with RVR - Off Cardizem  gtt. - She is not on anticoagulation at baseline on account of frailty, fall risk, chronic anemia, metastatic cancer - Restarted oral Cardizem  CD, continue metoprolol  - Per cards notes she has persistent A-fib so we will focus on rate control  Acute on chronic diastolic CHF - Clinically volume overloaded, suspect low albumin  also contributing to third spacing - Echo last year with preserved EF, indeterminate diastolic function, repeat echo ordered on admission will follow-up - Volume status has improved, creatinine trending up, hold further diuretics today, continue metoprolol , decrease Imdur  dose  - GDMT limited by CKD, monitor - Increase activity, PT eval  Left lower lobe pneumonia, leukocytosis - Continue IV ceftriaxone  and azithromycin , follow-up cultures - Clinically no symptoms suggestive of aspiration - Switch to oral ABX tomorrow  CKD 3B - Stable, monitor with diuresis  Metastatic breast cancer and history of ovarian cancer - Followed by  Dr. Cloretta, currently on Faslodex  - Appears to have advanced diffuse disease  DVT prophylaxis: Heparin  subcutaneous Code Status: DNR Family Communication: Discussed with patient detail Disposition Plan: Home likely 1-2d  Consultants:    Procedures:   Antimicrobials:    Objective: Vitals:   04/10/24 2333 04/11/24 0413 04/11/24 0500 04/11/24 0746  BP: (!) 140/63 136/71  (!) 163/80  Pulse: 78 67    Resp: 18 16  17   Temp: (!) 97.5 F (36.4 C) 97.6 F (36.4 C)  97.8 F (36.6 C)  TempSrc: Oral Oral  Oral  SpO2: 96% 97%    Weight:   49.9 kg   Height:        Intake/Output Summary (Last 24 hours) at 04/11/2024 1046 Last data filed at 04/11/2024 0551 Gross per 24 hour  Intake 519.92 ml  Output 500 ml  Net 19.92 ml   Filed Weights   04/09/24 1428 04/10/24 0603 04/11/24 0500  Weight: 49.9 kg 50.7 kg 49.9 kg    Examination:  General exam: Chronically ill frail sitting up in bed, AAO x 3 HEENT: No JVD Respiratory system: Few scattered rhonchi Cardiovascular system: S1 & S2 heard, irregular Abd: nondistended, soft and nontender.Normal bowel sounds heard. Central nervous system: Alert and oriented. No focal neurological deficits. Extremities: Trace edema Skin: No rashes Psychiatry:  Mood & affect appropriate.     Data Reviewed:   CBC: Recent Labs  Lab 04/09/24 0854 04/10/24 0224 04/11/24 0425  WBC 16.1* 20.8* 10.7*  HGB 11.0* 10.5* 9.8*  HCT 35.1* 33.0* 30.0*  MCV 94.9 91.2 91.5  PLT 243 207 224   Basic Metabolic Panel: Recent Labs  Lab 04/09/24 0854 04/10/24 0224 04/11/24 0425  NA 137 132* 137  K 5.0 4.6 3.5  CL 109 105 104  CO2 20* 18* 20*  GLUCOSE 124* 168* 122*  BUN 35* 37* 42*  CREATININE 1.57* 1.77* 2.26*  CALCIUM  9.2 8.6* 8.6*  MG 1.3* 1.8  --    GFR: Estimated Creatinine Clearance: 13.8 mL/min (A) (by C-G formula based on SCr of 2.26 mg/dL (H)). Liver Function Tests: Recent Labs  Lab 04/09/24 0854 04/10/24 0224  AST 34 44*  ALT 50*  62*  ALKPHOS 131* 133*  BILITOT 0.9 1.0  PROT 6.3* 6.0*  ALBUMIN  3.1* 2.7*   No results for input(s): LIPASE, AMYLASE in the last 168 hours. No results for input(s): AMMONIA in the last 168 hours. Coagulation Profile: No results for input(s): INR, PROTIME in the last 168 hours. Cardiac Enzymes: No results for input(s): CKTOTAL, CKMB, CKMBINDEX, TROPONINI in the last 168 hours. BNP (last 3 results) No results for input(s): PROBNP in the last 8760 hours. HbA1C: No results for input(s): HGBA1C in the last 72 hours. CBG: No results for input(s): GLUCAP in the last 168 hours. Lipid Profile: No results for input(s): CHOL, HDL, LDLCALC, TRIG, CHOLHDL, LDLDIRECT in the last 72 hours. Thyroid  Function Tests: No results for input(s): TSH, T4TOTAL, FREET4, T3FREE, THYROIDAB in the last 72 hours. Anemia Panel: No results for input(s): VITAMINB12, FOLATE, FERRITIN, TIBC, IRON, RETICCTPCT in the last 72 hours. Urine analysis:    Component Value Date/Time   COLORURINE YELLOW 11/21/2023 1840   APPEARANCEUR CLEAR 11/21/2023 1840   LABSPEC 1.016 11/21/2023 1840   PHURINE 5.0 11/21/2023 1840   GLUCOSEU NEGATIVE 11/21/2023 1840   HGBUR NEGATIVE 11/21/2023 1840   BILIRUBINUR NEGATIVE 11/21/2023 1840   BILIRUBINUR n 09/15/2015 1123   KETONESUR NEGATIVE 11/21/2023 1840   PROTEINUR NEGATIVE 11/21/2023 1840   UROBILINOGEN negative 09/15/2015 1123   UROBILINOGEN 0.2 04/11/2009 1015   NITRITE NEGATIVE 11/21/2023 1840   LEUKOCYTESUR NEGATIVE 11/21/2023 1840   Sepsis Labs: @LABRCNTIP (procalcitonin:4,lacticidven:4)  ) Recent Results (from the past 240 hours)  Culture, blood (routine x 2)     Status: None (Preliminary result)   Collection Time: 04/09/24  1:35 PM   Specimen: BLOOD RIGHT ARM  Result Value Ref Range Status   Specimen Description BLOOD RIGHT ARM  Final   Special Requests   Final    BOTTLES DRAWN AEROBIC AND ANAEROBIC Blood  Culture results may not be optimal due to an inadequate volume of blood received in culture bottles   Culture   Final    NO GROWTH 2 DAYS Performed at [facility], [address] [place], [place] [address]    Report Status PENDING  Incomplete  Culture, blood (routine x 2)     Status: None (Preliminary result)   Collection Time: 04/09/24  1:40 PM   Specimen: BLOOD RIGHT HAND  Result Value Ref Range Status   Specimen Description BLOOD RIGHT HAND  Final   Special Requests   Final    BOTTLES DRAWN AEROBIC ONLY Blood Culture results may not be optimal due to an inadequate volume of blood received in culture bottles   Culture   Final    NO GROWTH 2 DAYS Performed at [facility], [address], [place], [place] [address]    Report Status PENDING  Incomplete     Radiology Studies: ECHOCARDIOGRAM COMPLETE Result Date: 04/09/2024    ECHOCARDIOGRAM REPORT   Patient Name:   [name] Date of Exam: 04/09/2024 Medical Rec #:  [id number]        Height:  66.0 in Accession #:    [id number]       Weight:       109.9 lb Date of Birth:  July 29, 1934       BSA:          1.551 m Patient Age:    89 years         BP:           154/66 mmHg Patient Gender: F                HR:           99 bpm. Exam Location:  Inpatient Procedure: 2D Echo, Cardiac Doppler and Color Doppler (Both Spectral and Color            Flow Doppler were utilized during procedure). Indications:    Atrial Fibrillation  History:        Patient has prior history of Echocardiogram examinations, most                 recent 12/24/2023. CHF, CAD, Arrythmias:Atrial Fibrillation; Risk                 Factors:Hypertension.  Sonographer:    Philomena Daring Referring Phys: [id number] MARSA NOVAK MELVIN IMPRESSIONS  1. Left ventricular ejection fraction, by estimation, is 60 to 65%. The left ventricle has normal function. The left ventricle has no regional wall motion abnormalities. There is moderate asymmetric left ventricular hypertrophy of  the basal and septal segments. Left ventricular diastolic function could not be evaluated.  2. Right ventricular systolic function is normal. The right ventricular size is normal. Tricuspid regurgitation signal is inadequate for assessing PA pressure.  3. Left atrial size was severely dilated.  4. Right atrial size was mild to moderately dilated.  5. The mitral valve is abnormal. No evidence of mitral valve regurgitation. No evidence of mitral stenosis. Moderate to severe mitral annular calcification.  6. The aortic valve is tricuspid. Aortic valve regurgitation is mild. No aortic stenosis is present.  7. The inferior vena cava is dilated in size with >50% respiratory variability, suggesting right atrial pressure of 8 mmHg. Comparison(s): Changes from prior study are noted. AR is moderate on prior study. FINDINGS  Left Ventricle: Left ventricular ejection fraction, by estimation, is 60 to 65%. The left ventricle has normal function. The left ventricle has no regional wall motion abnormalities. Strain was performed and the global longitudinal strain is indeterminate. The left ventricular internal cavity size was normal in size. There is moderate asymmetric left ventricular hypertrophy of the basal and septal segments. Left ventricular diastolic function could not be evaluated due to atrial fibrillation. Left ventricular diastolic function could not be evaluated. Right Ventricle: The right ventricular size is normal. No increase in right ventricular wall thickness. Right ventricular systolic function is normal. Tricuspid regurgitation signal is inadequate for assessing PA pressure. Left Atrium: Left atrial size was severely dilated. Right Atrium: Right atrial size was mild to moderately dilated. Pericardium: There is no evidence of pericardial effusion. Mitral Valve: The mitral valve is abnormal. Moderate to severe mitral annular calcification. No evidence of mitral valve regurgitation. No evidence of mitral valve  stenosis. Tricuspid Valve: The tricuspid valve is normal in structure. Tricuspid valve regurgitation is mild . No evidence of tricuspid stenosis. Aortic Valve: The aortic valve is tricuspid. Aortic valve regurgitation is mild. No aortic stenosis is present. Pulmonic Valve: The pulmonic valve was grossly normal. Pulmonic valve regurgitation is not visualized. No evidence of pulmonic stenosis.  Aorta: The aortic root and ascending aorta are structurally normal, with no evidence of dilitation. Venous: The inferior vena cava is dilated in size with greater than 50% respiratory variability, suggesting right atrial pressure of 8 mmHg. IAS/Shunts: The interatrial septum was not well visualized. Additional Comments: 3D was performed not requiring image post processing on an independent workstation and was indeterminate.  LEFT VENTRICLE PLAX 2D LVIDd:         3.80 cm   Diastology LVIDs:         2.50 cm   LV e' medial:    4.57 cm/s LV PW:         1.00 cm   LV E/e' medial:  26.8 LV IVS:        1.10 cm   LV e' lateral:   8.27 cm/s LVOT diam:     1.80 cm   LV E/e' lateral: 14.8 LV SV:         58 LV SV Index:   37 LVOT Area:     2.54 cm  RIGHT VENTRICLE            IVC RV S prime:     7.83 cm/s  IVC diam: 2.10 cm TAPSE (M-mode): 1.3 cm LEFT ATRIUM             Index        RIGHT ATRIUM           Index LA Vol (A2C):   79.0 ml 50.95 ml/m  RA Area:     17.50 cm LA Vol (A4C):   69.2 ml 44.63 ml/m  RA Volume:   48.70 ml  31.41 ml/m LA Biplane Vol: 80.0 ml 51.60 ml/m  AORTIC VALVE LVOT Vmax:   100.00 cm/s LVOT Vmean:  69.500 cm/s LVOT VTI:    0.227 m  AORTA Ao Root diam: 2.50 cm Ao Asc diam:  3.00 cm MITRAL VALVE MV Area (PHT): 4.21 cm     SHUNTS MV Decel Time: 180 msec     Systemic VTI:  0.23 m MV E velocity: 122.50 cm/s  Systemic Diam: 1.80 cm MV A velocity: 104.00 cm/s MV E/A ratio:  1.18 Vishnu Priya Mallipeddi Electronically signed by Diannah Late Mallipeddi Signature Date/Time: 04/09/2024/4:07:35 PM    Final    CT Angio Chest  PE W and/or Wo Contrast Result Date: 04/09/2024 CLINICAL DATA:  MC-CTPulmonary embolism (PE) suspected, high prob history of lung cancer. Sternal chest pain LEFT shoulder pain. * Tracking Code: BO * EXAM: CT ANGIOGRAPHY CHEST WITH CONTRAST TECHNIQUE: Multidetector CT imaging of the chest was performed using the standard protocol during bolus administration of intravenous contrast. Multiplanar CT image reconstructions and MIPs were obtained to evaluate the vascular anatomy. RADIATION DOSE REDUCTION: This exam was performed according to the departmental dose-optimization program which includes automated exposure control, adjustment of the mA and/or kV according to patient size and/or use of iterative reconstruction technique. CONTRAST:  60mL OMNIPAQUE  IOHEXOL  350 MG/ML SOLN COMPARISON:  CT chest 03/25/2024 FINDINGS: Cardiovascular: No filling defects within the pulmonary arteries to suggest acute pulmonary embolism. No significant vascular findings. Normal heart size. No pericardial effusion. Mediastinum/Nodes: No axillary or supraclavicular adenopathy. No mediastinal or hilar adenopathy. No pericardial fluid. Esophagus normal. Lungs/Pleura: Bilateral pleural effusions similar prior. Moderate effusion on the RIGHT and small on the LEFT. There is passive atelectasis of the RIGHT lower lobe. Small subcentimeter nodules in the lung base not changed from recent CT. Increased consolidation at the LEFT lung base (image 259/series 7  and 90/series 6. Upper Abdomen: Hepatic metastasis not well appreciated no acute findings Musculoskeletal: Sclerotic skeletal metastasis again noted. No change. Surgical clips in the lateral RIGHT breast Review of the MIP images confirms the above findings. IMPRESSION: 1. No evidence acute pulmonary embolism. 2. Bilateral pleural effusions with passive atelectasis of the RIGHT lower lobe. 3. Increased consolidation at the LEFT lung base. Concern for LEFT lower lobe pneumonia. 4. Stable small  pulmonary nodules at the lung bases. 5. Stable skeletal metastasis. Electronically Signed   By: Jackquline Boxer M.D.   On: 04/09/2024 11:23     Scheduled Meds:  aspirin  EC  81 mg Oral Daily   diltiazem   360 mg Oral Daily   heparin   5,000 Units Subcutaneous Q8H   isosorbide  mononitrate  15 mg Oral Daily   magnesium  oxide  800 mg Oral BID   metoprolol  tartrate  50 mg Oral BID   pantoprazole   20 mg Oral Daily   rosuvastatin   10 mg Oral QHS   sodium chloride  flush  3 mL Intravenous Q12H   Continuous Infusions:  azithromycin  Stopped (04/10/24 1034)   cefTRIAXone  (ROCEPHIN )  IV 2 g (04/11/24 1015)     LOS: 1 day    Time spent:    Sigurd Pac, MD [facility]   04/11/2024, 10:46 AM

## 2024-04-12 ENCOUNTER — Other Ambulatory Visit (HOSPITAL_COMMUNITY): Payer: Self-pay

## 2024-04-12 ENCOUNTER — Encounter: Payer: Self-pay | Admitting: Oncology

## 2024-04-12 DIAGNOSIS — I5033 Acute on chronic diastolic (congestive) heart failure: Secondary | ICD-10-CM | POA: Diagnosis not present

## 2024-04-12 DIAGNOSIS — J189 Pneumonia, unspecified organism: Secondary | ICD-10-CM | POA: Diagnosis not present

## 2024-04-12 LAB — BASIC METABOLIC PANEL WITH GFR
Anion gap: 8 (ref 5–15)
BUN: 31 mg/dL — ABNORMAL HIGH (ref 8–23)
CO2: 21 mmol/L — ABNORMAL LOW (ref 22–32)
Calcium: 8.7 mg/dL — ABNORMAL LOW (ref 8.9–10.3)
Chloride: 110 mmol/L (ref 98–111)
Creatinine, Ser: 1.69 mg/dL — ABNORMAL HIGH (ref 0.44–1.00)
GFR, Estimated: 29 mL/min — ABNORMAL LOW (ref >60)
Glucose, Bld: 161 mg/dL — ABNORMAL HIGH (ref 70–99)
Potassium: 3.5 mmol/L (ref 3.5–5.1)
Sodium: 139 mmol/L (ref 135–145)

## 2024-04-12 LAB — CBC
HCT: 31.9 % — ABNORMAL LOW (ref 36.0–46.0)
Hemoglobin: 10.2 g/dL — ABNORMAL LOW (ref 12.0–15.0)
MCH: 29.1 pg (ref 26.0–34.0)
MCHC: 32 g/dL (ref 30.0–36.0)
MCV: 91.1 fL (ref 80.0–100.0)
Platelets: 243 K/uL (ref 150–400)
RBC: 3.5 MIL/uL — ABNORMAL LOW (ref 3.87–5.11)
RDW: 15.4 % (ref 11.5–15.5)
WBC: 8.5 K/uL (ref 4.0–10.5)
nRBC: 0 % (ref 0.0–0.2)

## 2024-04-12 MED ORDER — FUROSEMIDE 40 MG PO TABS
40.0000 mg | ORAL_TABLET | Freq: Every day | ORAL | Status: DC
  Administered 2024-04-12: 40 mg via ORAL
  Filled 2024-04-12: qty 1

## 2024-04-12 MED ORDER — FUROSEMIDE 40 MG PO TABS: 40.0000 mg | ORAL_TABLET | Freq: Every day | ORAL | Status: DC

## 2024-04-12 MED ORDER — CEFUROXIME AXETIL 250 MG PO TABS
250.0000 mg | ORAL_TABLET | Freq: Two times a day (BID) | ORAL | 0 refills | Status: AC
  Filled 2024-04-12: qty 6, 3d supply, fill #0

## 2024-04-12 MED ORDER — CEFUROXIME AXETIL 250 MG PO TABS
250.0000 mg | ORAL_TABLET | Freq: Two times a day (BID) | ORAL | Status: DC
  Administered 2024-04-12: 250 mg via ORAL
  Filled 2024-04-12 (×2): qty 1

## 2024-04-12 NOTE — TOC Initial Note (Signed)
 Transition of Care [facility]) - Initial/Assessment Note    Patient Details  Name: [name] MRN: [id number] Date of Birth: June 19, 1934  Transition of Care [facility]) CM/SW Contact:    Sudie Erminio Deems, RN Phone Number: 04/12/2024, 11:59 AM  Clinical Narrative: Risk for readmission assessment completed. Patient presented for hypertension, chest pain and shortness of breath. PTA patient was at [facility] and she was discharged last Monday. Patient is currently active with [facility] for PT/OT/RN-orders to follow. Patient has DME cane and rolling walker-declines bedside commode. [facility] is aware that she will transition home. Patient states she has transportation home and that she still drives to appointments and gets medications without any issues.                   Expected Discharge Plan: Home w Home Health Services Barriers to Discharge: No Barriers Identified   Patient Goals and CMS Choice Patient states their goals for this hospitalization and ongoing recovery are:: Patient wants to return home   Choice offered to / list presented to : NA (Currently active with [facility])      Expected Discharge Plan and Services In-house Referral: NA Discharge Planning Services: CM Consult Post Acute Care Choice: Home Health, Resumption of Svcs/PTA Provider Living arrangements for the past 2 months: Single Family Home                   DME Agency: NA       HH Arranged: RN, Disease Management, OT, PT HH Agency: [facility] Date [facility] Agency Contacted: 04/12/24 Time HH Agency Contacted: 1158 Representative spoke with at [facility] Agency: [place]  Prior Living Arrangements/Services Living arrangements for the past 2 months: Single Family Home Lives with:: Self Patient language and need for interpreter reviewed:: Yes Do you feel safe going back to the place where you live?: Yes      Need for Family Participation in Patient Care: No (Comment) Care  giver support system in place?: No (comment) Current home services: DME (cane and rolling walker) Criminal Activity/Legal Involvement Pertinent to Current Situation/Hospitalization: No - Comment as needed  Activities of Daily Living   ADL Screening (condition at time of admission) Independently performs ADLs?: Yes (appropriate for developmental age) Is the patient deaf or have difficulty hearing?: No Does the patient have difficulty seeing, even when wearing glasses/contacts?: No Does the patient have difficulty concentrating, remembering, or making decisions?: No  Permission Sought/Granted Permission sought to share information with : Family Supports, Magazine features editor, Case Estate manager/land agent granted to share information with : Yes, Verbal Permission Granted     Permission granted to share info w AGENCY: [facility]        Emotional Assessment Appearance:: Appears stated age Attitude/Demeanor/Rapport: Engaged Affect (typically observed): Appropriate Orientation: : Oriented to Self, Oriented to Place, Oriented to  Time, Oriented to Situation Alcohol / Substance Use: Not Applicable Psych Involvement: No (comment)  Admission diagnosis:  Hypomagnesemia [E83.42] Pleural effusion, bilateral [J90] Atrial fibrillation with RVR (HCC) [I48.91] Community acquired pneumonia of left lower lobe of lung [J18.9] Patient Active Problem List   Diagnosis Date Noted   Atrial fibrillation with RVR (HCC) 04/09/2024   Acute on chronic diastolic CHF (congestive heart failure) (HCC) 04/09/2024   CAP (community acquired pneumonia) 04/09/2024   Elevated troponin 01/02/2024   Electrolyte abnormality 12/27/2023   Persistent atrial fibrillation (HCC) 12/27/2023   Diastolic CHF, chronic (HCC) 12/25/2023   Chronic kidney disease, stage 3b (  HCC) 12/23/2023   Thrombocytopenia (HCC) 12/06/2023   Medication management 12/03/2023   Counseling and coordination of care 12/02/2023    Odynophagia 12/02/2023   Palliative care encounter 12/02/2023   Protein-calorie malnutrition, severe 12/02/2023   Failure to thrive 11/30/2023   Dysphagia 11/30/2023   Bicytopenia 11/27/2023   Hypokalemia 11/27/2023   Hypertrophic cardiomyopathy (HCC) 11/24/2023   Demand ischemia (HCC) 11/24/2023   Chronic anemia 11/24/2023   Malignant neoplasm of ovary (HCC) 11/24/2023   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) 11/12/2023   Infection of hand 02/02/2022   Genetic testing 07/20/2019   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/20/2019   Family history of breast cancer    Carcinoma of upper-outer quadrant of left breast in female, estrogen receptor positive (HCC) 05/19/2019   Left carotid bruit 01/21/2017   Hypertension    Hyperlipidemia    GERD (gastroesophageal reflux disease)    Ureteral calculi    History of breast cancer    History of ovarian cancer    History of skin cancer    Frequency of urination    Dysuria    Kidney stones    CAD (coronary artery disease)    Ductal carcinoma (HCC) 08/11/2013   Serous tumor, 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 [place] [address] Phone: [phone] Fax: [phone]  [id number] [place], [place] - [address] AT [facility] [address] [place] [address] Phone: [phone] Fax: [phone]  [facility] - [place], [place] - [address] [place] [address] Phone: [phone] Fax: [phone]     Social Drivers of Health (SDOH) Social History: SDOH Screenings   Food Insecurity: No Food Insecurity (04/09/2024)  Housing: Low Risk  (04/09/2024)  Transportation Needs: No Transportation Needs (04/09/2024)  Utilities: Not At Risk (04/09/2024)  Depression (PHQ2-9): Low Risk  (11/11/2023)  Social  Connections: Moderately Isolated (04/09/2024)  Tobacco Use: Low Risk  (04/09/2024)   SDOH Interventions:     Readmission Risk Interventions    04/12/2024   11:59 AM 12/09/2023   12:21 PM 11/23/2023   11:56 AM  Readmission Risk Prevention Plan  Transportation Screening Complete Complete Complete  PCP or Specialist Appt within 5-7 Days   Complete  Home Care Screening   Complete  Medication Review (RN CM)   Complete  Medication Review (RN Care Manager) Referral to Pharmacy    HRI or Home Care Consult Complete    SW Recovery Care/Counseling Consult Complete    Palliative Care Screening Not Applicable    Skilled Nursing Facility Not Applicable Complete

## 2024-04-12 NOTE — Discharge Summary (Signed)
 Physician Discharge Summary  [name] FMW:[id number] DOB: [birth year] DOA: 04/09/2024  PCP: Alison Charm, MD  Admit date: 04/09/2024 Discharge date: 04/12/2024  Time spent: 45 minutes  Recommendations for Outpatient Follow-up:  PCP in 1 week, please check BMP at follow-up Follow-up with oncology in 1 month   Discharge Diagnoses:  Atrial fibrillation with RVR Persistent atrial fib Community-acquired pneumonia Acute on chronic diastolic CHF Widely metastatic breast cancer   Hypertension   Hyperlipidemia   GERD (gastroesophageal reflux disease)   CAD (coronary artery disease)   Chronic anemia   Chronic kidney disease, stage 3b (HCC)   Atrial fibrillation with RVR (HCC)   Acute on chronic diastolic CHF (congestive heart failure) (HCC)   CAP (community acquired pneumonia)   Discharge Condition: Proved  Diet recommendation: Low-sodium, heart healthy  Filed Weights   04/10/24 0603 04/11/24 0500 04/12/24 0500  Weight: 50.7 kg 49.9 kg 49.9 kg    History of present illness:  89/F with history of widely metastatic breast cancer, and ovarian cancer chronic diastolic CHF sup, CKD 3B, persistent atrial fibrillation, hypertension, GERD, CAD, chronic anemia presented to the ED 7/4 with chest pain, substernal, she also noted some ongoing dyspnea.  In the ED, tachycardic in A-fib RVR, labs noted creatinine of 1.6, WBC 16, hemoglobin 11, troponin normal, D-dimer 3.5, chest x-ray noted pulmonary vascular congestion, small pleural effusions, CTA chest was negative for PE, noted bilateral pleural effusions as well as left lower lobe consolidation, stable nodules and sclerotic skeletal metastasis - Admitted, started on Cardizem  gtt., IV Lasix , antibiotics  Hospital Course:   Atrial fibrillation with RVR - Off Cardizem  gtt. - She is not on anticoagulation at baseline on account of frailty, fall risk, chronic anemia, metastatic cancer - Restarted oral Cardizem  CD, continue  metoprolol  - Per cards notes she has persistent A-fib, hence focuses on rate controlled, heart rate has improved on this regimen   Acute on chronic diastolic CHF - Clinically volume overloaded, suspect low albumin  also contributing to third spacing - Echo last year with preserved EF, indeterminate diastolic function, repeat echo ordered on admission will follow-up - Volume status has improved, creatinine trending up, further diuretics held yesterday, now improving, resumed Lasix  40 Mg daily, continue metoprolol , Imdur  - GDMT limited by CKD, monitor - Follow-up with PCP in 1 week, please check BMP   Left lower lobe pneumonia, leukocytosis - Treated with ceftriaxone  and azithromycin , she was negative - Clinically no symptoms suggestive of aspiration - Switch to oral cefuroxime , continue this for 3 more days   CKD 3B - Stable, monitor with diuresis   Metastatic breast cancer and history of ovarian cancer - Followed by Dr. Cloretta, currently on Faslodex  - Appears to have advanced diffuse disease  Discharge Exam: Vitals:   04/12/24 1040 04/12/24 1132  BP: (!) 155/100 (!) 157/66  Pulse:  87  Resp:  18  Temp:  97.8 F (36.6 C)  SpO2:  98%   General exam: Chronically ill frail sitting up in bed, AAO x 3 HEENT: No JVD Respiratory system: Few scattered rhonchi Cardiovascular system: S1 & S2 heard, irregular Abd: nondistended, soft and nontender.Normal bowel sounds heard. Central nervous system: Alert and oriented. No focal neurological deficits. Extremities: Trace edema Skin: No rashes Psychiatry:  Mood & affect appropriate.   Discharge Instructions   Discharge Instructions     Diet - low sodium heart healthy   Complete by: As directed    Increase activity slowly   Complete by: As directed  Allergies as of 04/12/2024       Reactions   Ramipril Itching        Medication List     STOP taking these medications    BAZA PROTECT MOISTURE BARRIER EX    doxycycline 100 MG tablet Commonly known as: VIBRA-TABS   Sonafine   sucralfate  1 GM/10ML suspension Commonly known as: CARAFATE        TAKE these medications    aspirin  EC 81 MG tablet Take 81 mg by mouth daily.   Belsomra 10 MG Tabs Generic drug: Suvorexant Take 10 mg by mouth at bedtime.   CALTRATE 600+D PO Take 1 tablet by mouth 2 (two) times daily.   cefUROXime  250 MG tablet Commonly known as: CEFTIN  Take 1 tablet (250 mg total) by mouth 2 (two) times daily with a meal for 3 days.   cyanocobalamin  1000 MCG tablet Commonly known as: VITAMIN B12 Take 1,000 mcg by mouth daily.   diltiazem  360 MG 24 hr capsule Commonly known as: TIAZAC  Take 360 mg by mouth daily.   furosemide  40 MG tablet Commonly known as: LASIX  Take 1 tablet (40 mg total) by mouth daily. What changed: when to take this   hydroxychloroquine  200 MG tablet Commonly known as: PLAQUENIL  Take 200 mg by mouth daily.   isosorbide  mononitrate 60 MG 24 hr tablet Commonly known as: IMDUR  Take 1 tablet (60 mg total) by mouth daily.   loperamide  2 MG capsule Commonly known as: IMODIUM  Take 1 capsule (2 mg total) by mouth as needed for diarrhea or loose stools.   magnesium  oxide 400 MG tablet Commonly known as: MAG-OX Take 2 tablets (800 mg total) by mouth 2 (two) times daily.   melatonin 3 MG Tabs tablet Take 1 tablet (3 mg total) by mouth at bedtime as needed.   metoprolol  tartrate 50 MG tablet Commonly known as: LOPRESSOR  Take 1 tablet (50 mg total) by mouth 2 (two) times daily.   Multivitamin Women 50+ Tabs Take 1 tablet by mouth daily with breakfast.   DECUBI-VITE PO Take 1 capsule by mouth daily at 6 (six) AM. For wound healing   oxyCODONE  5 MG immediate release tablet Commonly known as: Oxy IR/ROXICODONE  Take 1 tablet (5 mg total) by mouth every 6 (six) hours as needed.   pantoprazole  20 MG tablet Commonly known as: PROTONIX  Take 1 tablet (20 mg total) by mouth daily.    potassium chloride  20 MEQ packet Commonly known as: KLOR-CON  Take 20 mEq by mouth 2 (two) times daily.   rosuvastatin  10 MG tablet Commonly known as: CRESTOR  Take 10 mg by mouth at bedtime.   sodium bicarbonate  650 MG tablet Take 1 tablet (650 mg total) by mouth 2 (two) times daily.   tiZANidine  4 MG tablet Commonly known as: ZANAFLEX  Take 4 mg by mouth 2 (two) times daily as needed for muscle spasms.   triamcinolone  cream 0.1 % Commonly known as: KENALOG Apply 1 application  topically as needed (for rashes- affected areas).   Triple Antibiotic Oint Apply topically daily. To skin tears prn   Vitamin D3 50 MCG (2000 UT) Tabs Take 2,000 Units by mouth in the morning and at bedtime.       Allergies  Allergen Reactions   Ramipril Itching    Follow-up Information     [facility] Follow up.   Specialty: Home Health Services Why: Physical Therapy, Occupational Therapy, Registered Nurse-office to call with vist times Contact information: [address] [place]  [address] [phone]                  The results of significant diagnostics from this hospitalization (including imaging, microbiology, ancillary and laboratory) are listed below for reference.    Significant Diagnostic Studies: ECHOCARDIOGRAM COMPLETE Result Date: 04/09/2024    ECHOCARDIOGRAM REPORT   Patient Name:   [name] Date of Exam: 04/09/2024 Medical Rec #:  [id number]        Height:       66.0 in Accession #:    [id number]       Weight:       109.9 lb Date of Birth:  August 25, 1934       BSA:          1.551 m Patient Age:    89 years         BP:           154/66 mmHg Patient Gender: F                HR:           99 bpm. Exam Location:  Inpatient Procedure: 2D Echo, Cardiac Doppler and Color Doppler (Both Spectral and Color            Flow Doppler were utilized during procedure). Indications:    Atrial Fibrillation  History:        Patient has prior history of Echocardiogram  examinations, most                 recent 12/24/2023. CHF, CAD, Arrythmias:Atrial Fibrillation; Risk                 Factors:Hypertension.  Sonographer:    Philomena Daring Referring Phys: [id number] MARSA NOVAK MELVIN IMPRESSIONS  1. Left ventricular ejection fraction, by estimation, is 60 to 65%. The left ventricle has normal function. The left ventricle has no regional wall motion abnormalities. There is moderate asymmetric left ventricular hypertrophy of the basal and septal segments. Left ventricular diastolic function could not be evaluated.  2. Right ventricular systolic function is normal. The right ventricular size is normal. Tricuspid regurgitation signal is inadequate for assessing PA pressure.  3. Left atrial size was severely dilated.  4. Right atrial size was mild to moderately dilated.  5. The mitral valve is abnormal. No evidence of mitral valve regurgitation. No evidence of mitral stenosis. Moderate to severe mitral annular calcification.  6. The aortic valve is tricuspid. Aortic valve regurgitation is mild. No aortic stenosis is present.  7. The inferior vena cava is dilated in size with >50% respiratory variability, suggesting right atrial pressure of 8 mmHg. Comparison(s): Changes from prior study are noted. AR is moderate on prior study. FINDINGS  Left Ventricle: Left ventricular ejection fraction, by estimation, is 60 to 65%. The left ventricle has normal function. The left ventricle has no regional wall motion abnormalities. Strain was performed and the global longitudinal strain is indeterminate. The left ventricular internal cavity size was normal in size. There is moderate asymmetric left ventricular hypertrophy of the basal and septal segments. Left ventricular diastolic function could not be evaluated due to atrial fibrillation. Left ventricular diastolic function could not be evaluated. Right Ventricle: The right ventricular size is normal. No increase in right ventricular wall thickness. Right  ventricular systolic function is normal. Tricuspid regurgitation signal is inadequate for assessing PA pressure. Left Atrium: Left atrial size was severely dilated. Right Atrium: Right atrial size was mild to moderately dilated. Pericardium: There is  no evidence of pericardial effusion. Mitral Valve: The mitral valve is abnormal. Moderate to severe mitral annular calcification. No evidence of mitral valve regurgitation. No evidence of mitral valve stenosis. Tricuspid Valve: The tricuspid valve is normal in structure. Tricuspid valve regurgitation is mild . No evidence of tricuspid stenosis. Aortic Valve: The aortic valve is tricuspid. Aortic valve regurgitation is mild. No aortic stenosis is present. Pulmonic Valve: The pulmonic valve was grossly normal. Pulmonic valve regurgitation is not visualized. No evidence of pulmonic stenosis. Aorta: The aortic root and ascending aorta are structurally normal, with no evidence of dilitation. Venous: The inferior vena cava is dilated in size with greater than 50% respiratory variability, suggesting right atrial pressure of 8 mmHg. IAS/Shunts: The interatrial septum was not well visualized. Additional Comments: 3D was performed not requiring image post processing on an independent workstation and was indeterminate.  LEFT VENTRICLE PLAX 2D LVIDd:         3.80 cm   Diastology LVIDs:         2.50 cm   LV e' medial:    4.57 cm/s LV PW:         1.00 cm   LV E/e' medial:  26.8 LV IVS:        1.10 cm   LV e' lateral:   8.27 cm/s LVOT diam:     1.80 cm   LV E/e' lateral: 14.8 LV SV:         58 LV SV Index:   37 LVOT Area:     2.54 cm  RIGHT VENTRICLE            IVC RV S prime:     7.83 cm/s  IVC diam: 2.10 cm TAPSE (M-mode): 1.3 cm LEFT ATRIUM             Index        RIGHT ATRIUM           Index LA Vol (A2C):   79.0 ml 50.95 ml/m  RA Area:     17.50 cm LA Vol (A4C):   69.2 ml 44.63 ml/m  RA Volume:   48.70 ml  31.41 ml/m LA Biplane Vol: 80.0 ml 51.60 ml/m  AORTIC VALVE LVOT  Vmax:   100.00 cm/s LVOT Vmean:  69.500 cm/s LVOT VTI:    0.227 m  AORTA Ao Root diam: 2.50 cm Ao Asc diam:  3.00 cm MITRAL VALVE MV Area (PHT): 4.21 cm     SHUNTS MV Decel Time: 180 msec     Systemic VTI:  0.23 m MV E velocity: 122.50 cm/s  Systemic Diam: 1.80 cm MV A velocity: 104.00 cm/s MV E/A ratio:  1.18 Vishnu Priya Mallipeddi Electronically signed by Diannah Late Mallipeddi Signature Date/Time: 04/09/2024/4:07:35 PM    Final    CT Angio Chest PE W and/or Wo Contrast Result Date: 04/09/2024 CLINICAL DATA:  MC-CTPulmonary embolism (PE) suspected, high prob history of lung cancer. Sternal chest pain LEFT shoulder pain. * Tracking Code: BO * EXAM: CT ANGIOGRAPHY CHEST WITH CONTRAST TECHNIQUE: Multidetector CT imaging of the chest was performed using the standard protocol during bolus administration of intravenous contrast. Multiplanar CT image reconstructions and MIPs were obtained to evaluate the vascular anatomy. RADIATION DOSE REDUCTION: This exam was performed according to the departmental dose-optimization program which includes automated exposure control, adjustment of the mA and/or kV according to patient size and/or use of iterative reconstruction technique. CONTRAST:  60mL OMNIPAQUE  IOHEXOL  350 MG/ML SOLN COMPARISON:  CT chest 03/25/2024 FINDINGS:  Cardiovascular: No filling defects within the pulmonary arteries to suggest acute pulmonary embolism. No significant vascular findings. Normal heart size. No pericardial effusion. Mediastinum/Nodes: No axillary or supraclavicular adenopathy. No mediastinal or hilar adenopathy. No pericardial fluid. Esophagus normal. Lungs/Pleura: Bilateral pleural effusions similar prior. Moderate effusion on the RIGHT and small on the LEFT. There is passive atelectasis of the RIGHT lower lobe. Small subcentimeter nodules in the lung base not changed from recent CT. Increased consolidation at the LEFT lung base (image 259/series 7 and 90/series 6. Upper Abdomen: Hepatic  metastasis not well appreciated no acute findings Musculoskeletal: Sclerotic skeletal metastasis again noted. No change. Surgical clips in the lateral RIGHT breast Review of the MIP images confirms the above findings. IMPRESSION: 1. No evidence acute pulmonary embolism. 2. Bilateral pleural effusions with passive atelectasis of the RIGHT lower lobe. 3. Increased consolidation at the LEFT lung base. Concern for LEFT lower lobe pneumonia. 4. Stable small pulmonary nodules at the lung bases. 5. Stable skeletal metastasis. Electronically Signed   By: Jackquline Boxer M.D.   On: 04/09/2024 11:23   DG Chest Port 1 View Result Date: 04/09/2024 CLINICAL DATA:  Chest pain EXAM: PORTABLE CHEST 1 VIEW COMPARISON:  01/01/2024 FINDINGS: Low volume film. The cardio pericardial silhouette is enlarged. Vascular congestion without overt airspace pulmonary edema. Bibasilar atelectasis/infiltrate is similar in the interval with small bilateral pleural effusions. No acute bony abnormality. Telemetry leads overlie the chest. IMPRESSION: 1. Low volume film with vascular congestion and small bilateral pleural effusions. 2. Bibasilar atelectasis/infiltrate is similar in the interval. Electronically Signed   By: Camellia Candle M.D.   On: 04/09/2024 08:57   CT CHEST ABDOMEN PELVIS WO CONTRAST Result Date: 03/26/2024 CLINICAL DATA:  Metastatic breast cancer. Hysterectomy. Appendectomy. Kidney stones. * Tracking Code: BO * EXAM: CT CHEST, ABDOMEN AND PELVIS WITHOUT CONTRAST TECHNIQUE: Multidetector CT imaging of the chest, abdomen and pelvis was performed following the standard protocol without IV contrast. RADIATION DOSE REDUCTION: This exam was performed according to the departmental dose-optimization program which includes automated exposure control, adjustment of the mA and/or kV according to patient size and/or use of iterative reconstruction technique. COMPARISON:  11/23/2023 abdominopelvic CT. Most recent chest CT 10/16/2023.  FINDINGS: CT CHEST FINDINGS Cardiovascular: Aortic atherosclerosis. Mild cardiomegaly, without pericardial effusion. Left main and 3 vessel coronary artery calcification. Mediastinum/Nodes: No supraclavicular adenopathy. No axillary adenopathy. No mediastinal or hilar adenopathy, given limitations of unenhanced CT. No internal mammary adenopathy. Lungs/Pleura: Small, right larger than left pleural effusions are minimally increased from the prior chest CT. Right greater than left base atelectasis. Significant decrease in size and number of diffuse millimetric pulmonary nodules/metastasis. An index right lower lobe 4 mm nodule on 98/3 measures 5 mm on 10/16/2023. Index left lower lobe 3 mm nodule on 86/3 measured 4 mm on the prior (when remeasured). Left upper lobe 3 mm nodule on 53/3 measured 4-5 mm on the prior. Musculoskeletal: Right breast surgical changes. A superior left breast or chest wall nodule measures 11 x 11 mm on 31/2 and is new since 10/16/2023. Osteopenia. Old right rib fractures. T11 severe compression deformity with ventral canal encroachment again identified. Interval healing of previously lytic vertebral body metastasis, including at T12, T10, T8, T9. CT ABDOMEN PELVIS FINDINGS Hepatobiliary: Given limitation of noncontrast technique, marked improvement in appearance of hepatic metastasis since 11/23/2023. Example posterior right hepatic lobe 1.4 cm lesion at the site of a 4.4 x 3.1 cm lesion on that exam. Cholecystectomy, without biliary ductal dilatation. Pancreas: Normal for  age.  Atrophy but no duct dilatation. Spleen: Normal in size, without focal abnormality. Adrenals/Urinary Tract: A right adrenal nodule measures 1.5 cm and 27 HU. Normal left adrenal gland. Bilateral renal collecting system calculi including at up to 5 mm inter pole left kidney. Bilateral renal lesions which are primarily low-density and likely represent cysts. An interpolar right renal 8 mm lesion is hyperattenuating and  most likely a complex cyst. Of doubtful clinical significance given age comorbidities. The right-sided hydronephrosis has resolved.  No bladder calculi. Stomach/Bowel: Proximal gastric underdistention. Partial colectomy. Normal small bowel. Vascular/Lymphatic: Advanced aortic and branch vessel atherosclerosis. No abdominopelvic adenopathy. Reproductive: Hysterectomy.  No adnexal mass. Other: No significant free fluid. No evidence of omental or peritoneal disease. Musculoskeletal: Osteopenia. Grade 1 L5-S1 anterolisthesis. Interval healing of L4 spinous process metastasis with probable nondisplaced pathologic fracture including on 55/5. IMPRESSION: 1. Moderate response to the therapy of pulmonary, hepatic, and osseous metastasis, as detailed above. 2. Superior left breast or left chest wall nodule is new since 10/16/2023. Cannot exclude new site of metastasis or metachronous breast primary. 3. Mild increase in small bilateral pleural effusions. 4. Right adrenal nodule which given ongoing stability is favored to represent a benign adenoma. 5. Resolved right-sided hydronephrosis. 6. Bilateral nephrolithiasis 7. Incidental findings, including: Coronary artery atherosclerosis. Aortic Atherosclerosis (ICD10-I70.0). Electronically Signed   By: Rockey Kilts M.D.   On: 03/26/2024 11:29    Microbiology: Recent Results (from the past 240 hours)  Culture, blood (routine x 2)     Status: None (Preliminary result)   Collection Time: 04/09/24  1:35 PM   Specimen: BLOOD RIGHT ARM  Result Value Ref Range Status   Specimen Description BLOOD RIGHT ARM  Final   Special Requests   Final    BOTTLES DRAWN AEROBIC AND ANAEROBIC Blood Culture results may not be optimal due to an inadequate volume of blood received in culture bottles   Culture   Final    NO GROWTH 3 DAYS Performed at [facility], [address], [place], [place] [address]    Report Status PENDING  Incomplete  Culture, blood (routine x 2)     Status:  None (Preliminary result)   Collection Time: 04/09/24  1:40 PM   Specimen: BLOOD RIGHT HAND  Result Value Ref Range Status   Specimen Description BLOOD RIGHT HAND  Final   Special Requests   Final    BOTTLES DRAWN AEROBIC ONLY Blood Culture results may not be optimal due to an inadequate volume of blood received in culture bottles   Culture   Final    NO GROWTH 3 DAYS Performed at [facility], [address] [place], [place] [address]    Report Status PENDING  Incomplete     Labs: Basic Metabolic Panel: Recent Labs  Lab 04/09/24 0854 04/10/24 0224 04/11/24 0425 04/12/24 0350  NA 137 132* 137 139  K 5.0 4.6 3.5 3.5  CL 109 105 104 110  CO2 20* 18* 20* 21*  GLUCOSE 124* 168* 122* 161*  BUN 35* 37* 42* 31*  CREATININE 1.57* 1.77* 2.26* 1.69*  CALCIUM  9.2 8.6* 8.6* 8.7*  MG 1.3* 1.8  --   --    Liver Function Tests: Recent Labs  Lab 04/09/24 0854 04/10/24 0224  AST 34 44*  ALT 50* 62*  ALKPHOS 131* 133*  BILITOT 0.9 1.0  PROT 6.3* 6.0*  ALBUMIN  3.1* 2.7*   No results for input(s): LIPASE, AMYLASE in the last 168 hours. No results  for input(s): AMMONIA in the last 168 hours. CBC: Recent Labs  Lab 04/09/24 0854 04/10/24 0224 04/11/24 0425 04/12/24 0350  WBC 16.1* 20.8* 10.7* 8.5  HGB 11.0* 10.5* 9.8* 10.2*  HCT 35.1* 33.0* 30.0* 31.9*  MCV 94.9 91.2 91.5 91.1  PLT 243 207 224 243   Cardiac Enzymes: No results for input(s): CKTOTAL, CKMB, CKMBINDEX, TROPONINI in the last 168 hours. BNP: BNP (last 3 results) Recent Labs    01/01/24 2356 02/16/24 1640 04/09/24 0854  BNP 2,416.8* 1,085.2* 954.6*    ProBNP (last 3 results) No results for input(s): PROBNP in the last 8760 hours.  CBG: No results for input(s): GLUCAP in the last 168 hours.     Signed:  Sigurd Pac MD.  [facility] 04/12/2024, 12:35 PM

## 2024-04-12 NOTE — Evaluation (Signed)
 Occupational Therapy Evaluation Patient Details Name: [name] MRN: [id number] DOB: 22-Oct-1933 Today's Date: 04/12/2024   History of Present Illness   The pt is an 89 yo female presenting 7/4 with chest pain and SOB. CTA chest showed bilateral pleural effusions and left lower lobe consolidation suggestive of PNA. PMH includes: stage IV breast cancer (widely metastatic to lung, liver, spine, ribs, peritoneum) which she is going through treatment for currently, PAF not on AC, chronic HFpEF, HTN, CKD stage IIIb, anemia, HTN, recent admission 2/28 -3/04 for new onset PAF with RVR.     Clinical Impressions Patient admitted for the diagnosis above.  PTA she lives alone, but does have PRN assist from family and friends.  Per the patient, she is receiving HH PT, and is generally Mod I for mobility and ADL completion.  Patient presents with SOB and unsteadiness, but states she is close to her baseline.  No OT in the acute setting, defer mobility and balance to PT.  No post acute OT is anticipated.       If plan is discharge home, recommend the following:   Assist for transportation;Assistance with cooking/housework;A little help with bathing/dressing/bathroom;A little help with walking and/or transfers     Functional Status Assessment   Patient has had a recent decline in their functional status and demonstrates the ability to make significant improvements in function in a reasonable and predictable amount of time.     Equipment Recommendations   None recommended by OT     Recommendations for Other Services         Precautions/Restrictions   Precautions Precautions: Fall Precaution/Restrictions Comments: watch HR with exertion Restrictions Weight Bearing Restrictions Per Provider Order: No     Mobility Bed Mobility Overal bed mobility: Modified Independent                  Transfers Overall transfer level: Needs assistance Equipment used: None Transfers:  Sit to/from Stand, Bed to chair/wheelchair/BSC Sit to Stand: Supervision     Step pivot transfers: Contact guard assist            Balance Overall balance assessment: Needs assistance Sitting-balance support: Feet supported Sitting balance-Leahy Scale: Good     Standing balance support: No upper extremity supported Standing balance-Leahy Scale: Fair                             ADL either performed or assessed with clinical judgement   ADL Overall ADL's : At baseline                                       General ADL Comments: CGA for balance     Vision Patient Visual Report: No change from baseline       Perception Perception: Not tested       Praxis Praxis: Not tested       Pertinent Vitals/Pain Pain Assessment Pain Assessment: No/denies pain     Extremity/Trunk Assessment Upper Extremity Assessment Upper Extremity Assessment: Overall WFL for tasks assessed   Lower Extremity Assessment Lower Extremity Assessment: Defer to PT evaluation   Cervical / Trunk Assessment Cervical / Trunk Assessment: Kyphotic   Communication Communication Communication: No apparent difficulties   Cognition Arousal: Alert Behavior During Therapy: WFL for tasks assessed/performed Cognition: No apparent impairments  Following commands: Intact       Cueing  General Comments   Cueing Techniques: Verbal cues   Elevated HR and BP   Exercises     Shoulder Instructions      Home Living Family/patient expects to be discharged to:: Private residence Living Arrangements: Alone Available Help at Discharge: Available PRN/intermittently;Family Type of Home: House Home Access: Stairs to enter [employer] of Steps: 3 Entrance Stairs-Rails: Right;Left Home Layout: Two level;1/2 bath on main level Alternate Level Stairs-Number of Steps: 14 with chair lift.   Bathroom Shower/Tub: Retail banker: Standard Bathroom Accessibility: Yes How Accessible: Accessible via walker Home Equipment: Grab bars - tub/shower;Rolling Walker (2 wheels)          Prior Functioning/Environment Prior Level of Function : Driving;Needs assist               ADLs Comments: needed assist to bathe and dress, reports managing well at home so far. daughter bought groceries    OT Problem List: Impaired balance (sitting and/or standing)   OT Treatment/Interventions:        OT Goals(Current goals can be found in the care plan section)   Acute Rehab OT Goals Patient Stated Goal: Hoping to go home today OT Goal Formulation: With patient Time For Goal Achievement: 04/16/24 Potential to Achieve Goals: Good   OT Frequency:       Co-evaluation              AM-PAC OT 6 Clicks Daily Activity     Outcome Measure Help from another person eating meals?: None Help from another person taking care of personal grooming?: None Help from another person toileting, which includes using toliet, bedpan, or urinal?: A Little Help from another person bathing (including washing, rinsing, drying)?: A Little Help from another person to put on and taking off regular upper body clothing?: None Help from another person to put on and taking off regular lower body clothing?: A Little 6 Click Score: 21   End of Session    Activity Tolerance: Patient tolerated treatment well Patient left: in chair;with call bell/phone within reach;with chair alarm set  OT Visit Diagnosis: Unsteadiness on feet (R26.81)                Time: 9180-9159 OT Time Calculation (min): 21 min Charges:  OT General Charges $OT Visit: 1 Visit OT Evaluation $OT Eval Moderate Complexity: 1 Mod  04/12/2024  RP, OTR/L  Acute Rehabilitation Services  Office:  [phone]   [name] 04/12/2024, 8:41 AM

## 2024-04-12 NOTE — Plan of Care (Signed)

## 2024-04-12 NOTE — Progress Notes (Signed)
 Heart Failure Navigator Progress Note  Assessed for Heart & Vascular TOC clinic readiness.  Patient does not meet criteria due to EF 60-65%, history of Metastatic Cancer. No HF TOC. .   Navigator will sign off at this time.   Stephane Haddock, BSN, Scientist, clinical (histocompatibility and immunogenetics) Only

## 2024-04-12 NOTE — Progress Notes (Signed)
 Discharge instructions (including medications) discussed with and copy provided to patient, verbalized understanding. PIV x 2 removed and patient assisted with dressing. TOC medications retrieved at discharge. Patient discharged home with family.

## 2024-04-12 NOTE — Progress Notes (Signed)
 Mobility Specialist Progress Note;   04/12/24 1001  Mobility  Activity Ambulated with assistance in hallway  Level of Assistance Contact guard assist, steadying assist  Assistive Device None  Distance Ambulated (ft) 200 ft  Activity Response Tolerated well  Mobility Referral Yes  Mobility visit 1 Mobility  Mobility Specialist Start Time (ACUTE ONLY) 1001  Mobility Specialist Stop Time (ACUTE ONLY) 1008  Mobility Specialist Time Calculation (min) (ACUTE ONLY) 7 min   Pt agreeable to mobility. Required light MinG assistance during ambulation. HR up to 130 bpm w/ activity, no c/o when asked. Pt returned back to bed with all needs met, alarm on.   Lauraine Erm Mobility Specialist Please contact via SecureChat or [employer] [phone]

## 2024-04-14 LAB — CULTURE, BLOOD (ROUTINE X 2)
Culture: NO GROWTH
Culture: NO GROWTH

## 2024-04-15 ENCOUNTER — Ambulatory Visit (HOSPITAL_BASED_OUTPATIENT_CLINIC_OR_DEPARTMENT_OTHER): Admitting: General Surgery

## 2024-04-16 DIAGNOSIS — N1832 Chronic kidney disease, stage 3b: Secondary | ICD-10-CM | POA: Diagnosis not present

## 2024-04-16 DIAGNOSIS — E43 Unspecified severe protein-calorie malnutrition: Secondary | ICD-10-CM | POA: Diagnosis not present

## 2024-04-16 DIAGNOSIS — K219 Gastro-esophageal reflux disease without esophagitis: Secondary | ICD-10-CM | POA: Diagnosis not present

## 2024-04-16 DIAGNOSIS — M069 Rheumatoid arthritis, unspecified: Secondary | ICD-10-CM | POA: Diagnosis not present

## 2024-04-16 DIAGNOSIS — Z8543 Personal history of malignant neoplasm of ovary: Secondary | ICD-10-CM | POA: Diagnosis not present

## 2024-04-16 DIAGNOSIS — D72829 Elevated white blood cell count, unspecified: Secondary | ICD-10-CM | POA: Diagnosis not present

## 2024-04-16 DIAGNOSIS — C7951 Secondary malignant neoplasm of bone: Secondary | ICD-10-CM | POA: Diagnosis not present

## 2024-04-16 DIAGNOSIS — N179 Acute kidney failure, unspecified: Secondary | ICD-10-CM | POA: Diagnosis not present

## 2024-04-16 DIAGNOSIS — I130 Hypertensive heart and chronic kidney disease with heart failure and stage 1 through stage 4 chronic kidney disease, or unspecified chronic kidney disease: Secondary | ICD-10-CM | POA: Diagnosis not present

## 2024-04-16 DIAGNOSIS — Z860100 Personal history of colon polyps, unspecified: Secondary | ICD-10-CM | POA: Diagnosis not present

## 2024-04-16 DIAGNOSIS — J188 Other pneumonia, unspecified organism: Secondary | ICD-10-CM | POA: Diagnosis not present

## 2024-04-16 DIAGNOSIS — E876 Hypokalemia: Secondary | ICD-10-CM | POA: Diagnosis not present

## 2024-04-16 DIAGNOSIS — D631 Anemia in chronic kidney disease: Secondary | ICD-10-CM | POA: Diagnosis not present

## 2024-04-16 DIAGNOSIS — R1310 Dysphagia, unspecified: Secondary | ICD-10-CM | POA: Diagnosis not present

## 2024-04-16 DIAGNOSIS — Z7982 Long term (current) use of aspirin: Secondary | ICD-10-CM | POA: Diagnosis not present

## 2024-04-16 DIAGNOSIS — I5033 Acute on chronic diastolic (congestive) heart failure: Secondary | ICD-10-CM | POA: Diagnosis not present

## 2024-04-16 DIAGNOSIS — I2510 Atherosclerotic heart disease of native coronary artery without angina pectoris: Secondary | ICD-10-CM | POA: Diagnosis not present

## 2024-04-16 DIAGNOSIS — E785 Hyperlipidemia, unspecified: Secondary | ICD-10-CM | POA: Diagnosis not present

## 2024-04-16 DIAGNOSIS — Z85828 Personal history of other malignant neoplasm of skin: Secondary | ICD-10-CM | POA: Diagnosis not present

## 2024-04-16 DIAGNOSIS — Z681 Body mass index (BMI) 19 or less, adult: Secondary | ICD-10-CM | POA: Diagnosis not present

## 2024-04-16 DIAGNOSIS — G4700 Insomnia, unspecified: Secondary | ICD-10-CM | POA: Diagnosis not present

## 2024-04-16 DIAGNOSIS — I4819 Other persistent atrial fibrillation: Secondary | ICD-10-CM | POA: Diagnosis not present

## 2024-04-16 DIAGNOSIS — C50919 Malignant neoplasm of unspecified site of unspecified female breast: Secondary | ICD-10-CM | POA: Diagnosis not present

## 2024-04-20 DIAGNOSIS — N179 Acute kidney failure, unspecified: Secondary | ICD-10-CM | POA: Diagnosis not present

## 2024-04-20 DIAGNOSIS — I4819 Other persistent atrial fibrillation: Secondary | ICD-10-CM | POA: Diagnosis not present

## 2024-04-20 DIAGNOSIS — I130 Hypertensive heart and chronic kidney disease with heart failure and stage 1 through stage 4 chronic kidney disease, or unspecified chronic kidney disease: Secondary | ICD-10-CM | POA: Diagnosis not present

## 2024-04-20 DIAGNOSIS — N1832 Chronic kidney disease, stage 3b: Secondary | ICD-10-CM | POA: Diagnosis not present

## 2024-04-20 DIAGNOSIS — J188 Other pneumonia, unspecified organism: Secondary | ICD-10-CM | POA: Diagnosis not present

## 2024-04-20 DIAGNOSIS — I5033 Acute on chronic diastolic (congestive) heart failure: Secondary | ICD-10-CM | POA: Diagnosis not present

## 2024-04-21 ENCOUNTER — Telehealth: Payer: Self-pay

## 2024-04-21 ENCOUNTER — Other Ambulatory Visit: Payer: Self-pay

## 2024-04-21 DIAGNOSIS — C50919 Malignant neoplasm of unspecified site of unspecified female breast: Secondary | ICD-10-CM
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
## 2024-04-22 ENCOUNTER — Inpatient Hospital Stay (HOSPITAL_BASED_OUTPATIENT_CLINIC_OR_DEPARTMENT_OTHER): Admitting: Nurse Practitioner

## 2024-04-22 ENCOUNTER — Other Ambulatory Visit: Payer: Self-pay

## 2024-04-22 ENCOUNTER — Inpatient Hospital Stay: Attending: Oncology

## 2024-04-22 ENCOUNTER — Inpatient Hospital Stay (HOSPITAL_COMMUNITY)

## 2024-04-22 ENCOUNTER — Encounter: Payer: Self-pay | Admitting: Nurse Practitioner

## 2024-04-22 ENCOUNTER — Inpatient Hospital Stay (HOSPITAL_COMMUNITY)
Admission: EM | Admit: 2024-04-22 | Discharge: 2024-04-30 | DRG: 682 | Disposition: A | Discharge status: Home Health | Attending: Internal Medicine | Admitting: Internal Medicine

## 2024-04-22 ENCOUNTER — Encounter (HOSPITAL_COMMUNITY): Payer: Self-pay

## 2024-04-22 ENCOUNTER — Inpatient Hospital Stay

## 2024-04-22 VITALS — BP 115/54 | HR 63 | Temp 98.2°F | Resp 16 | Ht 66.0 in | Wt 112.9 lb

## 2024-04-22 DIAGNOSIS — Z66 Do not resuscitate: Secondary | ICD-10-CM | POA: Diagnosis present

## 2024-04-22 DIAGNOSIS — Z7901 Long term (current) use of anticoagulants: Secondary | ICD-10-CM | POA: Diagnosis not present

## 2024-04-22 DIAGNOSIS — J439 Emphysema, unspecified: Secondary | ICD-10-CM | POA: Diagnosis not present

## 2024-04-22 DIAGNOSIS — I4892 Unspecified atrial flutter: Secondary | ICD-10-CM | POA: Diagnosis present

## 2024-04-22 DIAGNOSIS — I6602 Occlusion and stenosis of left middle cerebral artery: Secondary | ICD-10-CM | POA: Diagnosis not present

## 2024-04-22 DIAGNOSIS — R29898 Other symptoms and signs involving the musculoskeletal system: Secondary | ICD-10-CM | POA: Diagnosis not present

## 2024-04-22 DIAGNOSIS — R0902 Hypoxemia: Secondary | ICD-10-CM | POA: Diagnosis present

## 2024-04-22 DIAGNOSIS — J90 Pleural effusion, not elsewhere classified: Secondary | ICD-10-CM | POA: Diagnosis not present

## 2024-04-22 DIAGNOSIS — K90829 Short bowel syndrome, unspecified: Secondary | ICD-10-CM | POA: Diagnosis present

## 2024-04-22 DIAGNOSIS — C787 Secondary malignant neoplasm of liver and intrahepatic bile duct: Secondary | ICD-10-CM | POA: Insufficient documentation

## 2024-04-22 DIAGNOSIS — R0602 Shortness of breath: Secondary | ICD-10-CM | POA: Diagnosis not present

## 2024-04-22 DIAGNOSIS — R0989 Other specified symptoms and signs involving the circulatory and respiratory systems: Secondary | ICD-10-CM | POA: Diagnosis not present

## 2024-04-22 DIAGNOSIS — C50912 Malignant neoplasm of unspecified site of left female breast: Secondary | ICD-10-CM | POA: Diagnosis not present

## 2024-04-22 DIAGNOSIS — Z5111 Encounter for antineoplastic chemotherapy: Secondary | ICD-10-CM | POA: Insufficient documentation

## 2024-04-22 DIAGNOSIS — I4891 Unspecified atrial fibrillation: Secondary | ICD-10-CM | POA: Diagnosis not present

## 2024-04-22 DIAGNOSIS — I129 Hypertensive chronic kidney disease with stage 1 through stage 4 chronic kidney disease, or unspecified chronic kidney disease: Secondary | ICD-10-CM | POA: Diagnosis not present

## 2024-04-22 DIAGNOSIS — N179 Acute kidney failure, unspecified: Secondary | ICD-10-CM | POA: Diagnosis not present

## 2024-04-22 DIAGNOSIS — I130 Hypertensive heart and chronic kidney disease with heart failure and stage 1 through stage 4 chronic kidney disease, or unspecified chronic kidney disease: Secondary | ICD-10-CM | POA: Diagnosis not present

## 2024-04-22 DIAGNOSIS — N1832 Chronic kidney disease, stage 3b: Secondary | ICD-10-CM | POA: Diagnosis not present

## 2024-04-22 DIAGNOSIS — Z8619 Personal history of other infectious and parasitic diseases: Secondary | ICD-10-CM

## 2024-04-22 DIAGNOSIS — Z170 Estrogen receptor positive status [ER+]: Secondary | ICD-10-CM

## 2024-04-22 DIAGNOSIS — D649 Anemia, unspecified: Secondary | ICD-10-CM | POA: Diagnosis not present

## 2024-04-22 DIAGNOSIS — R404 Transient alteration of awareness: Secondary | ICD-10-CM | POA: Diagnosis not present

## 2024-04-22 DIAGNOSIS — Z85118 Personal history of other malignant neoplasm of bronchus and lung: Secondary | ICD-10-CM | POA: Diagnosis not present

## 2024-04-22 DIAGNOSIS — E872 Acidosis, unspecified: Secondary | ICD-10-CM | POA: Diagnosis not present

## 2024-04-22 DIAGNOSIS — R29703 NIHSS score 3: Secondary | ICD-10-CM | POA: Diagnosis present

## 2024-04-22 DIAGNOSIS — D631 Anemia in chronic kidney disease: Secondary | ICD-10-CM | POA: Diagnosis present

## 2024-04-22 DIAGNOSIS — E876 Hypokalemia: Secondary | ICD-10-CM | POA: Diagnosis not present

## 2024-04-22 DIAGNOSIS — Z7982 Long term (current) use of aspirin: Secondary | ICD-10-CM

## 2024-04-22 DIAGNOSIS — C786 Secondary malignant neoplasm of retroperitoneum and peritoneum: Secondary | ICD-10-CM | POA: Diagnosis present

## 2024-04-22 DIAGNOSIS — R34 Anuria and oliguria: Secondary | ICD-10-CM | POA: Diagnosis present

## 2024-04-22 DIAGNOSIS — I5030 Unspecified diastolic (congestive) heart failure: Secondary | ICD-10-CM | POA: Diagnosis not present

## 2024-04-22 DIAGNOSIS — D630 Anemia in neoplastic disease: Secondary | ICD-10-CM | POA: Diagnosis present

## 2024-04-22 DIAGNOSIS — I959 Hypotension, unspecified: Secondary | ICD-10-CM | POA: Diagnosis not present

## 2024-04-22 DIAGNOSIS — N200 Calculus of kidney: Secondary | ICD-10-CM | POA: Diagnosis not present

## 2024-04-22 DIAGNOSIS — C50919 Malignant neoplasm of unspecified site of unspecified female breast: Secondary | ICD-10-CM | POA: Diagnosis not present

## 2024-04-22 DIAGNOSIS — E875 Hyperkalemia: Principal | ICD-10-CM | POA: Diagnosis present

## 2024-04-22 DIAGNOSIS — C569 Malignant neoplasm of unspecified ovary: Secondary | ICD-10-CM | POA: Diagnosis present

## 2024-04-22 DIAGNOSIS — C7800 Secondary malignant neoplasm of unspecified lung: Secondary | ICD-10-CM | POA: Insufficient documentation

## 2024-04-22 DIAGNOSIS — I482 Chronic atrial fibrillation, unspecified: Secondary | ICD-10-CM | POA: Diagnosis not present

## 2024-04-22 DIAGNOSIS — N189 Chronic kidney disease, unspecified: Secondary | ICD-10-CM | POA: Diagnosis not present

## 2024-04-22 DIAGNOSIS — I4819 Other persistent atrial fibrillation: Secondary | ICD-10-CM | POA: Diagnosis not present

## 2024-04-22 DIAGNOSIS — E8721 Acute metabolic acidosis: Secondary | ICD-10-CM

## 2024-04-22 DIAGNOSIS — Z803 Family history of malignant neoplasm of breast: Secondary | ICD-10-CM

## 2024-04-22 DIAGNOSIS — I2510 Atherosclerotic heart disease of native coronary artery without angina pectoris: Secondary | ICD-10-CM | POA: Diagnosis present

## 2024-04-22 DIAGNOSIS — Z8543 Personal history of malignant neoplasm of ovary: Secondary | ICD-10-CM

## 2024-04-22 DIAGNOSIS — I10 Essential (primary) hypertension: Secondary | ICD-10-CM | POA: Diagnosis not present

## 2024-04-22 DIAGNOSIS — I700 Atherosclerosis of aorta: Secondary | ICD-10-CM | POA: Diagnosis not present

## 2024-04-22 DIAGNOSIS — I509 Heart failure, unspecified: Secondary | ICD-10-CM | POA: Diagnosis not present

## 2024-04-22 DIAGNOSIS — I6340 Cerebral infarction due to embolism of unspecified cerebral artery: Secondary | ICD-10-CM | POA: Diagnosis not present

## 2024-04-22 DIAGNOSIS — R231 Pallor: Secondary | ICD-10-CM | POA: Diagnosis not present

## 2024-04-22 DIAGNOSIS — R531 Weakness: Secondary | ICD-10-CM | POA: Diagnosis not present

## 2024-04-22 DIAGNOSIS — Z888 Allergy status to other drugs, medicaments and biological substances status: Secondary | ICD-10-CM

## 2024-04-22 DIAGNOSIS — E785 Hyperlipidemia, unspecified: Secondary | ICD-10-CM | POA: Diagnosis not present

## 2024-04-22 DIAGNOSIS — I5033 Acute on chronic diastolic (congestive) heart failure: Secondary | ICD-10-CM | POA: Diagnosis present

## 2024-04-22 DIAGNOSIS — R918 Other nonspecific abnormal finding of lung field: Secondary | ICD-10-CM | POA: Diagnosis not present

## 2024-04-22 DIAGNOSIS — C50412 Malignant neoplasm of upper-outer quadrant of left female breast: Secondary | ICD-10-CM

## 2024-04-22 DIAGNOSIS — Z923 Personal history of irradiation: Secondary | ICD-10-CM

## 2024-04-22 DIAGNOSIS — Z992 Dependence on renal dialysis: Secondary | ICD-10-CM | POA: Diagnosis not present

## 2024-04-22 DIAGNOSIS — C7951 Secondary malignant neoplasm of bone: Secondary | ICD-10-CM

## 2024-04-22 DIAGNOSIS — R109 Unspecified abdominal pain: Secondary | ICD-10-CM | POA: Diagnosis not present

## 2024-04-22 DIAGNOSIS — E559 Vitamin D deficiency, unspecified: Secondary | ICD-10-CM | POA: Diagnosis not present

## 2024-04-22 DIAGNOSIS — Z85828 Personal history of other malignant neoplasm of skin: Secondary | ICD-10-CM

## 2024-04-22 DIAGNOSIS — Z83438 Family history of other disorder of lipoprotein metabolism and other lipidemia: Secondary | ICD-10-CM

## 2024-04-22 DIAGNOSIS — R001 Bradycardia, unspecified: Secondary | ICD-10-CM | POA: Diagnosis not present

## 2024-04-22 DIAGNOSIS — R739 Hyperglycemia, unspecified: Secondary | ICD-10-CM | POA: Diagnosis present

## 2024-04-22 DIAGNOSIS — T501X5A Adverse effect of loop [high-ceiling] diuretics, initial encounter: Secondary | ICD-10-CM | POA: Diagnosis not present

## 2024-04-22 DIAGNOSIS — R55 Syncope and collapse: Secondary | ICD-10-CM | POA: Diagnosis not present

## 2024-04-22 DIAGNOSIS — Z8249 Family history of ischemic heart disease and other diseases of the circulatory system: Secondary | ICD-10-CM

## 2024-04-22 DIAGNOSIS — Z79899 Other long term (current) drug therapy: Secondary | ICD-10-CM

## 2024-04-22 DIAGNOSIS — R54 Age-related physical debility: Secondary | ICD-10-CM | POA: Diagnosis present

## 2024-04-22 DIAGNOSIS — I639 Cerebral infarction, unspecified: Secondary | ICD-10-CM | POA: Diagnosis not present

## 2024-04-22 DIAGNOSIS — E038 Other specified hypothyroidism: Secondary | ICD-10-CM | POA: Diagnosis not present

## 2024-04-22 DIAGNOSIS — I6782 Cerebral ischemia: Secondary | ICD-10-CM | POA: Diagnosis not present

## 2024-04-22 DIAGNOSIS — R29818 Other symptoms and signs involving the nervous system: Secondary | ICD-10-CM | POA: Diagnosis not present

## 2024-04-22 DIAGNOSIS — E878 Other disorders of electrolyte and fluid balance, not elsewhere classified: Secondary | ICD-10-CM | POA: Diagnosis present

## 2024-04-22 DIAGNOSIS — G8321 Monoplegia of upper limb affecting right dominant side: Secondary | ICD-10-CM | POA: Diagnosis present

## 2024-04-22 DIAGNOSIS — J189 Pneumonia, unspecified organism: Secondary | ICD-10-CM | POA: Diagnosis not present

## 2024-04-22 DIAGNOSIS — I6529 Occlusion and stenosis of unspecified carotid artery: Secondary | ICD-10-CM | POA: Diagnosis not present

## 2024-04-22 DIAGNOSIS — R410 Disorientation, unspecified: Secondary | ICD-10-CM | POA: Diagnosis not present

## 2024-04-22 DIAGNOSIS — Z9049 Acquired absence of other specified parts of digestive tract: Secondary | ICD-10-CM

## 2024-04-22 DIAGNOSIS — K219 Gastro-esophageal reflux disease without esophagitis: Secondary | ICD-10-CM | POA: Diagnosis present

## 2024-04-22 LAB — CMP (CANCER CENTER ONLY)
ALT: 36 U/L (ref 0–44)
AST: 25 U/L (ref 15–41)
Albumin: 3.5 g/dL (ref 3.5–5.0)
Alkaline Phosphatase: 137 U/L — ABNORMAL HIGH (ref 38–126)
Anion gap: 14 (ref 5–15)
BUN: 45 mg/dL — ABNORMAL HIGH (ref 8–23)
CO2: 15 mmol/L — ABNORMAL LOW (ref 22–32)
Calcium: 8.4 mg/dL — ABNORMAL LOW (ref 8.9–10.3)
Chloride: 108 mmol/L (ref 98–111)
Creatinine: 2.98 mg/dL — ABNORMAL HIGH (ref 0.44–1.00)
GFR, Estimated: 15 mL/min — ABNORMAL LOW (ref >60)
Glucose, Bld: 131 mg/dL — ABNORMAL HIGH (ref 70–99)
Potassium: 6.5 mmol/L (ref 3.5–5.1)
Sodium: 137 mmol/L (ref 135–145)
Total Bilirubin: 0.4 mg/dL (ref 0.0–1.2)
Total Protein: 6.2 g/dL — ABNORMAL LOW (ref 6.5–8.1)

## 2024-04-22 LAB — COMPREHENSIVE METABOLIC PANEL WITH GFR
ALT: 39 U/L (ref 0–44)
AST: 49 U/L — ABNORMAL HIGH (ref 15–41)
Albumin: 2.5 g/dL — ABNORMAL LOW (ref 3.5–5.0)
Alkaline Phosphatase: 118 U/L (ref 38–126)
Anion gap: 13 (ref 5–15)
BUN: 46 mg/dL — ABNORMAL HIGH (ref 8–23)
CO2: 12 mmol/L — ABNORMAL LOW (ref 22–32)
Calcium: 8 mg/dL — ABNORMAL LOW (ref 8.9–10.3)
Chloride: 110 mmol/L (ref 98–111)
Creatinine, Ser: 3.09 mg/dL — ABNORMAL HIGH (ref 0.44–1.00)
GFR, Estimated: 14 mL/min — ABNORMAL LOW (ref >60)
Glucose, Bld: 259 mg/dL — ABNORMAL HIGH (ref 70–99)
Potassium: 7.4 mmol/L (ref 3.5–5.1)
Sodium: 135 mmol/L (ref 135–145)
Total Bilirubin: 0.6 mg/dL (ref 0.0–1.2)
Total Protein: 5.5 g/dL — ABNORMAL LOW (ref 6.5–8.1)

## 2024-04-22 LAB — BASIC METABOLIC PANEL - CANCER CENTER ONLY
Anion gap: 13 (ref 5–15)
BUN: 44 mg/dL — ABNORMAL HIGH (ref 8–23)
CO2: 17 mmol/L — ABNORMAL LOW (ref 22–32)
Calcium: 8.7 mg/dL — ABNORMAL LOW (ref 8.9–10.3)
Chloride: 109 mmol/L (ref 98–111)
Creatinine: 3.04 mg/dL — ABNORMAL HIGH (ref 0.44–1.00)
GFR, Estimated: 14 mL/min — ABNORMAL LOW (ref >60)
Glucose, Bld: 128 mg/dL — ABNORMAL HIGH (ref 70–99)
Potassium: 6.5 mmol/L (ref 3.5–5.1)
Sodium: 138 mmol/L (ref 135–145)

## 2024-04-22 LAB — CBC WITH DIFFERENTIAL/PLATELET
Abs Immature Granulocytes: 0.37 K/uL — ABNORMAL HIGH (ref 0.00–0.07)
Basophils Absolute: 0.1 K/uL (ref 0.0–0.1)
Basophils Relative: 1 %
Eosinophils Absolute: 0.1 K/uL (ref 0.0–0.5)
Eosinophils Relative: 1 %
HCT: 29.8 % — ABNORMAL LOW (ref 36.0–46.0)
Hemoglobin: 9 g/dL — ABNORMAL LOW (ref 12.0–15.0)
Immature Granulocytes: 3 %
Lymphocytes Relative: 13 %
Lymphs Abs: 1.6 K/uL (ref 0.7–4.0)
MCH: 29.2 pg (ref 26.0–34.0)
MCHC: 30.2 g/dL (ref 30.0–36.0)
MCV: 96.8 fL (ref 80.0–100.0)
Monocytes Absolute: 0.9 K/uL (ref 0.1–1.0)
Monocytes Relative: 7 %
Neutro Abs: 9.6 K/uL — ABNORMAL HIGH (ref 1.7–7.7)
Neutrophils Relative %: 75 %
Platelets: 259 K/uL (ref 150–400)
RBC: 3.08 MIL/uL — ABNORMAL LOW (ref 3.87–5.11)
RDW: 16.9 % — ABNORMAL HIGH (ref 11.5–15.5)
WBC: 12.6 K/uL — ABNORMAL HIGH (ref 4.0–10.5)
nRBC: 0 % (ref 0.0–0.2)

## 2024-04-22 LAB — CBC WITH DIFFERENTIAL (CANCER CENTER ONLY)
Abs Immature Granulocytes: 0.18 K/uL — ABNORMAL HIGH (ref 0.00–0.07)
Basophils Absolute: 0.1 K/uL (ref 0.0–0.1)
Basophils Relative: 1 %
Eosinophils Absolute: 0 K/uL (ref 0.0–0.5)
Eosinophils Relative: 0 %
HCT: 29.5 % — ABNORMAL LOW (ref 36.0–46.0)
Hemoglobin: 9.2 g/dL — ABNORMAL LOW (ref 12.0–15.0)
Immature Granulocytes: 2 %
Lymphocytes Relative: 6 %
Lymphs Abs: 0.7 K/uL (ref 0.7–4.0)
MCH: 29.2 pg (ref 26.0–34.0)
MCHC: 31.2 g/dL (ref 30.0–36.0)
MCV: 93.7 fL (ref 80.0–100.0)
Monocytes Absolute: 1 K/uL (ref 0.1–1.0)
Monocytes Relative: 9 %
Neutro Abs: 9.3 K/uL — ABNORMAL HIGH (ref 1.7–7.7)
Neutrophils Relative %: 82 %
Platelet Count: 228 K/uL (ref 150–400)
RBC: 3.15 MIL/uL — ABNORMAL LOW (ref 3.87–5.11)
RDW: 17 % — ABNORMAL HIGH (ref 11.5–15.5)
WBC Count: 11.3 K/uL — ABNORMAL HIGH (ref 4.0–10.5)
nRBC: 0 % (ref 0.0–0.2)

## 2024-04-22 LAB — CBC
HCT: 28.7 % — ABNORMAL LOW (ref 36.0–46.0)
Hemoglobin: 8.8 g/dL — ABNORMAL LOW (ref 12.0–15.0)
MCH: 29.4 pg (ref 26.0–34.0)
MCHC: 30.7 g/dL (ref 30.0–36.0)
MCV: 96 fL (ref 80.0–100.0)
Platelets: 239 K/uL (ref 150–400)
RBC: 2.99 MIL/uL — ABNORMAL LOW (ref 3.87–5.11)
RDW: 16.7 % — ABNORMAL HIGH (ref 11.5–15.5)
WBC: 16.1 K/uL — ABNORMAL HIGH (ref 4.0–10.5)
nRBC: 0 % (ref 0.0–0.2)

## 2024-04-22 LAB — CBG MONITORING, ED
Glucose-Capillary: 265 mg/dL — ABNORMAL HIGH (ref 70–99)
Glucose-Capillary: 203 mg/dL — ABNORMAL HIGH (ref 70–99)

## 2024-04-22 LAB — RENAL FUNCTION PANEL
Albumin: 2.5 g/dL — ABNORMAL LOW (ref 3.5–5.0)
Anion gap: 12 (ref 5–15)
BUN: 46 mg/dL — ABNORMAL HIGH (ref 8–23)
CO2: 14 mmol/L — ABNORMAL LOW (ref 22–32)
Calcium: 8.8 mg/dL — ABNORMAL LOW (ref 8.9–10.3)
Chloride: 112 mmol/L — ABNORMAL HIGH (ref 98–111)
Creatinine, Ser: 3.02 mg/dL — ABNORMAL HIGH (ref 0.44–1.00)
GFR, Estimated: 14 mL/min — ABNORMAL LOW (ref >60)
Glucose, Bld: 160 mg/dL — ABNORMAL HIGH (ref 70–99)
Phosphorus: 6 mg/dL — ABNORMAL HIGH (ref 2.5–4.6)
Potassium: 6.5 mmol/L (ref 3.5–5.1)
Sodium: 138 mmol/L (ref 135–145)

## 2024-04-22 LAB — I-STAT CHEM 8, ED
BUN: 43 mg/dL — ABNORMAL HIGH (ref 8–23)
BUN: 43 mg/dL — ABNORMAL HIGH (ref 8–23)
Calcium, Ion: 1.13 mmol/L — ABNORMAL LOW (ref 1.15–1.40)
Calcium, Ion: 1.24 mmol/L (ref 1.15–1.40)
Chloride: 112 mmol/L — ABNORMAL HIGH (ref 98–111)
Chloride: 113 mmol/L — ABNORMAL HIGH (ref 98–111)
Creatinine, Ser: 3.3 mg/dL — ABNORMAL HIGH (ref 0.44–1.00)
Creatinine, Ser: 3.1 mg/dL — ABNORMAL HIGH (ref 0.44–1.00)
Glucose, Bld: 264 mg/dL — ABNORMAL HIGH (ref 70–99)
Glucose, Bld: 291 mg/dL — ABNORMAL HIGH (ref 70–99)
HCT: 27 % — ABNORMAL LOW (ref 36.0–46.0)
HCT: 23 % — ABNORMAL LOW (ref 36.0–46.0)
Hemoglobin: 9.2 g/dL — ABNORMAL LOW (ref 12.0–15.0)
Hemoglobin: 7.8 g/dL — ABNORMAL LOW (ref 12.0–15.0)
Potassium: 7.4 mmol/L (ref 3.5–5.1)
Potassium: 6.6 mmol/L (ref 3.5–5.1)
Sodium: 135 mmol/L (ref 135–145)
Sodium: 136 mmol/L (ref 135–145)
TCO2: 12 mmol/L — ABNORMAL LOW (ref 22–32)
TCO2: 13 mmol/L — ABNORMAL LOW (ref 22–32)

## 2024-04-22 LAB — HEPATITIS B SURFACE ANTIGEN: Hepatitis B Surface Ag: NONREACTIVE

## 2024-04-22 LAB — URIC ACID: Uric Acid, Serum: 8.8 mg/dL — ABNORMAL HIGH (ref 2.5–7.1)

## 2024-04-22 LAB — MAGNESIUM
Magnesium: 1.1 mg/dL — ABNORMAL LOW (ref 1.7–2.4)
Magnesium: 1.1 mg/dL — ABNORMAL LOW (ref 1.7–2.4)

## 2024-04-22 LAB — LACTATE DEHYDROGENASE: LDH: 267 U/L — ABNORMAL HIGH (ref 98–192)

## 2024-04-22 LAB — AMMONIA: Ammonia: 24 umol/L (ref 9–35)

## 2024-04-22 LAB — TSH: TSH: 18.047 u[IU]/mL — ABNORMAL HIGH (ref 0.350–4.500)

## 2024-04-22 MED ORDER — LIDOCAINE HCL (PF) 1 % IJ SOLN: 5.0000 mL | INTRAMUSCULAR | Status: DC | PRN

## 2024-04-22 MED ORDER — INSULIN ASPART 100 UNIT/ML IJ SOLN
0.0000 [IU] | INTRAMUSCULAR | Status: DC
  Administered 2024-04-23: 1 [IU] via SUBCUTANEOUS
  Administered 2024-04-24: 2 [IU] via SUBCUTANEOUS
  Administered 2024-04-24 – 2024-04-25 (×2): 1 [IU] via SUBCUTANEOUS

## 2024-04-22 MED ORDER — INSULIN ASPART 100 UNIT/ML IJ SOLN
5.0000 [IU] | Freq: Once | INTRAMUSCULAR | Status: AC
  Administered 2024-04-22: 5 [IU] via INTRAVENOUS

## 2024-04-22 MED ORDER — DEXTROSE 50 % IV SOLN: 25.0000 mL | Freq: Once | INTRAVENOUS | Status: DC

## 2024-04-22 MED ORDER — INSULIN ASPART 100 UNIT/ML IJ SOLN
10.0000 [IU] | Freq: Once | INTRAMUSCULAR | Status: AC
  Administered 2024-04-22: 10 [IU] via INTRAVENOUS

## 2024-04-22 MED ORDER — HEPARIN SODIUM (PORCINE) 5000 UNIT/ML IJ SOLN
5000.0000 [IU] | Freq: Three times a day (TID) | INTRAMUSCULAR | Status: DC
  Administered 2024-04-23 – 2024-04-24 (×5): 5000 [IU] via SUBCUTANEOUS
  Filled 2024-04-22 (×5): qty 1

## 2024-04-22 MED ORDER — LIDOCAINE-PRILOCAINE 2.5-2.5 % EX CREA: 1.0000 | TOPICAL_CREAM | CUTANEOUS | Status: DC | PRN

## 2024-04-22 MED ORDER — MAGNESIUM SULFATE 4 GM/100ML IV SOLN
4.0000 g | Freq: Once | INTRAVENOUS | Status: AC
  Administered 2024-04-22: 4 g via INTRAVENOUS
  Filled 2024-04-22: qty 100

## 2024-04-22 MED ORDER — FUROSEMIDE 10 MG/ML IJ SOLN
INTRAMUSCULAR | Status: AC
  Filled 2024-04-22: qty 4

## 2024-04-22 MED ORDER — HEPARIN SODIUM (PORCINE) 1000 UNIT/ML IJ SOLN
INTRAMUSCULAR | Status: AC
  Filled 2024-04-22: qty 4

## 2024-04-22 MED ORDER — CALCIUM GLUCONATE-NACL 1-0.675 GM/50ML-% IV SOLN
1.0000 g | Freq: Once | INTRAVENOUS | Status: AC
  Administered 2024-04-22: 1000 mg via INTRAVENOUS
  Filled 2024-04-22: qty 50

## 2024-04-22 MED ORDER — ANTICOAGULANT SODIUM CITRATE 4% (200MG/5ML) IV SOLN: 5.0000 mL | Status: DC | PRN

## 2024-04-22 MED ORDER — HEPARIN SODIUM (PORCINE) 5000 UNIT/ML IJ SOLN: 5000.0000 [IU] | Freq: Three times a day (TID) | INTRAMUSCULAR | Status: DC

## 2024-04-22 MED ORDER — HEPARIN SODIUM (PORCINE) 1000 UNIT/ML IJ SOLN
4000.0000 [IU] | Freq: Once | INTRAMUSCULAR | Status: AC
  Administered 2024-04-22: 3000 [IU]
  Filled 2024-04-22: qty 4

## 2024-04-22 MED ORDER — PENTAFLUOROPROP-TETRAFLUOROETH EX AERO: 1.0000 | INHALATION_SPRAY | CUTANEOUS | Status: DC | PRN

## 2024-04-22 MED ORDER — ALBUTEROL SULFATE (2.5 MG/3ML) 0.083% IN NEBU
2.5000 mg | INHALATION_SOLUTION | Freq: Once | RESPIRATORY_TRACT | Status: AC
  Administered 2024-04-22: 2.5 mg via RESPIRATORY_TRACT
  Filled 2024-04-22: qty 3

## 2024-04-22 MED ORDER — POLYETHYLENE GLYCOL 3350 17 G PO PACK: 17.0000 g | PACK | Freq: Every day | ORAL | Status: DC | PRN

## 2024-04-22 MED ORDER — CALCIUM GLUCONATE 10 % IV SOLN
1.0000 g | Freq: Once | INTRAVENOUS | Status: AC
  Administered 2024-04-22: 1 g via INTRAVENOUS

## 2024-04-22 MED ORDER — CALCIUM GLUCONATE 10 % IV SOLN: 1.0000 g | Freq: Once | INTRAVENOUS | Status: AC

## 2024-04-22 MED ORDER — ONDANSETRON HCL 4 MG/2ML IJ SOLN: 4.0000 mg | Freq: Four times a day (QID) | INTRAMUSCULAR | Status: DC | PRN

## 2024-04-22 MED ORDER — DEXTROSE 50 % IV SOLN
50.0000 mL | Freq: Once | INTRAVENOUS | Status: AC
  Administered 2024-04-22: 50 mL via INTRAVENOUS

## 2024-04-22 MED ORDER — SODIUM CHLORIDE 0.9 % IV BOLUS
1000.0000 mL | Freq: Once | INTRAVENOUS | Status: AC
  Administered 2024-04-22: 1000 mL via INTRAVENOUS

## 2024-04-22 MED ORDER — LACTATED RINGERS IV BOLUS: 1000.0000 mL | Freq: Once | INTRAVENOUS | Status: DC

## 2024-04-22 MED ORDER — ALBUTEROL SULFATE (2.5 MG/3ML) 0.083% IN NEBU: 2.5000 mg | INHALATION_SOLUTION | Freq: Once | RESPIRATORY_TRACT | Status: AC

## 2024-04-22 MED ORDER — ALBUTEROL SULFATE (2.5 MG/3ML) 0.083% IN NEBU
INHALATION_SOLUTION | RESPIRATORY_TRACT | Status: AC
  Administered 2024-04-22: 2.5 mg via RESPIRATORY_TRACT
  Filled 2024-04-22: qty 3

## 2024-04-22 MED ORDER — SODIUM ZIRCONIUM CYCLOSILICATE 10 G PO PACK
10.0000 g | PACK | Freq: Three times a day (TID) | ORAL | Status: DC
  Filled 2024-04-22: qty 1

## 2024-04-22 MED ORDER — ALTEPLASE 2 MG IJ SOLR
2.0000 mg | Freq: Once | INTRAMUSCULAR | Status: DC | PRN
Start: 2024-04-22 — End: 2024-04-30

## 2024-04-22 MED ORDER — FUROSEMIDE 10 MG/ML IJ SOLN
80.0000 mg | Freq: Once | INTRAMUSCULAR | Status: AC
  Administered 2024-04-22: 80 mg via INTRAVENOUS
  Filled 2024-04-22: qty 8

## 2024-04-22 MED ORDER — FULVESTRANT 250 MG/5ML IM SOSY
500.0000 mg | PREFILLED_SYRINGE | Freq: Once | INTRAMUSCULAR | Status: AC
Start: 2024-04-22 — End: 2024-04-22
  Administered 2024-04-22: 500 mg via INTRAMUSCULAR
  Filled 2024-04-22: qty 10

## 2024-04-22 MED ORDER — CALCIUM GLUCONATE 10 % IV SOLN
INTRAVENOUS | Status: AC
  Administered 2024-04-22: 1 g via INTRAVENOUS
  Filled 2024-04-22: qty 10

## 2024-04-22 MED ORDER — STERILE WATER FOR INJECTION IV SOLN
INTRAVENOUS | Status: DC
  Filled 2024-04-22 (×2): qty 1000

## 2024-04-22 MED ORDER — MAGNESIUM SULFATE 2 GM/50ML IV SOLN
2.0000 g | Freq: Once | INTRAVENOUS | Status: AC
  Administered 2024-04-22: 2 g via INTRAVENOUS

## 2024-04-22 MED ORDER — INSULIN ASPART 100 UNIT/ML IJ SOLN: 10.0000 [IU] | Freq: Once | INTRAMUSCULAR | Status: DC

## 2024-04-22 MED ORDER — CHLORHEXIDINE GLUCONATE CLOTH 2 % EX PADS
6.0000 | MEDICATED_PAD | Freq: Every day | CUTANEOUS | Status: DC
  Administered 2024-04-23 – 2024-04-30 (×8): 6 via TOPICAL

## 2024-04-22 MED ORDER — FUROSEMIDE 10 MG/ML IJ SOLN: 40.0000 mg | Freq: Once | INTRAMUSCULAR | Status: DC

## 2024-04-22 MED ORDER — HEPARIN SODIUM (PORCINE) 1000 UNIT/ML DIALYSIS: 1000.0000 [IU] | INTRAMUSCULAR | Status: DC | PRN

## 2024-04-22 MED ORDER — ATROPINE SULFATE 1 MG/10ML IJ SOSY
1.0000 mg | PREFILLED_SYRINGE | Freq: Once | INTRAMUSCULAR | Status: AC
  Administered 2024-04-22: 1 mg via INTRAVENOUS

## 2024-04-22 MED ORDER — DOCUSATE SODIUM 100 MG PO CAPS: 100.0000 mg | ORAL_CAPSULE | Freq: Two times a day (BID) | ORAL | Status: DC | PRN

## 2024-04-22 NOTE — Consult Note (Signed)
 Nephrology Consult   Requesting provider: Dr. Mannie Service requesting consult: ER Reason for consult: hyperkalemia   Assessment/Recommendations:   Hyperkalemia, unstable -temporizing measures for now. Unable to take lokelma  -CCM consulted, access to be obtained. Will plan for emergent HD. Discussed with HD unit and they should be ready once line obtained. If any delays, then recommend CRRT -will give a dose of lasix  80mg  IV now for kaliuresis  AKI on CKD -checking imaging to rule out obstruction given her cancer history and presence of stones on previous imaging, checking urine -Avoid nephrotoxic medications including NSAIDs and iodinated intravenous contrast exposure unless the latter is absolutely indicated.  Preferred narcotic agents for pain control are hydromorphone , fentanyl , and methadone. Morphine  should not be used. Avoid Baclofen and avoid oral sodium phosphate  and magnesium  citrate based laxatives / bowel preps. Continue strict Input and Output monitoring. Will monitor the patient closely with you and intervene or adjust therapy as indicated by changes in clinical status/labs   Acidosis -likely secondary to AKI and chronic diarrhea -HD as above  HTN -hold home BP meds for now  Anemia due to chronic disease: -Transfuse for Hgb<7 g/dL -avoid ESAs given malignancy   Discussed with daughter at the bedside. Discussed with ERMD and CCM. Coordinated with KDU.   Ephriam Stank [facility] 04/22/2024 4:59 PM   _____________________________________________________________________________________   History of Present Illness: [name] is a/an 89 y.o. female with a past medical history of CKD (followed by Dr. Dolan), metastatic breast and ovarian cancer, chronic diastolic CHF, chronic diarrhea, A-fib, hypertension, GERD, CAD, anemia who presents to [facility] with loss of consciousness and hyperkalemia. She was found to have a potassium of 6.5 during her  oncology visit.  She has an appointment with her nephrologist thereafter which the daughter took her to.  In the office, she had loss of consciousness during her appointment.  Did not lose pulse.  Found to be bradycardic.  EMS called. In the ER, found to have a potassium of 7.4, creatinine up to 3.3.  Heart rate in 30s. Patient seen and examined in ER.  Daughter at bedside.  Daughter reports that patient has chronic diarrhea but none today which is odd for her. She does consent to temporary dialysis. No other complaints reported. Was on potassium chloride  40meq daily.   Medications:  Current Facility-Administered Medications  Medication Dose Route Frequency Provider Last Rate Last Admin   calcium  gluconate 1 g/ 50 mL sodium chloride  IVPB  1 g Intravenous Once Mannie Pac T, DO 50 mL/hr at 04/22/24 1658 1,000 mg at 04/22/24 1658   insulin  aspart (novoLOG ) injection 10 Units  10 Units Intravenous Once Mannie Pac T, DO       magnesium  sulfate IVPB 2 g 50 mL  2 g Intravenous Once Mannie Pac T, DO 50 mL/hr at 04/22/24 1631 2 g at 04/22/24 1631   sodium zirconium cyclosilicate  (LOKELMA ) packet 10 g  10 g Oral TID Stank Ephriam, MD       Current Outpatient Medications  Medication Sig Dispense Refill   aspirin  EC 81 MG tablet Take 81 mg by mouth daily.     BELSOMRA 10 MG TABS Take 10 mg by mouth at bedtime.     Calcium  Carbonate-Vitamin D  (CALTRATE 600+D PO) Take 1 tablet by mouth 2 (two) times daily.     Cholecalciferol  (VITAMIN D3) 50 MCG (2000 UT) TABS Take 2,000 Units by mouth in the morning and at bedtime.     cyanocobalamin  (VITAMIN B12)  1000 MCG tablet Take 1,000 mcg by mouth daily.     diltiazem  (TIAZAC ) 360 MG 24 hr capsule Take 360 mg by mouth daily.     furosemide  (LASIX ) 40 MG tablet Take 1 tablet (40 mg total) by mouth daily.     hydroxychloroquine  (PLAQUENIL ) 200 MG tablet Take 200 mg by mouth daily.     isosorbide  mononitrate (IMDUR ) 60 MG 24 hr tablet Take 1 tablet (60 mg  total) by mouth daily. 90 tablet 2   loperamide  (IMODIUM ) 2 MG capsule Take 1 capsule (2 mg total) by mouth as needed for diarrhea or loose stools. (Patient not taking: Reported on 04/22/2024)     magnesium  oxide (MAG-OX) 400 MG tablet Take 2 tablets (800 mg total) by mouth 2 (two) times daily. 120 tablet 0   melatonin 3 MG TABS tablet Take 1 tablet (3 mg total) by mouth at bedtime as needed.     metoprolol  tartrate (LOPRESSOR ) 50 MG tablet Take 1 tablet (50 mg total) by mouth 2 (two) times daily. 180 tablet 3   Multiple Vitamins-Minerals (DECUBI-VITE PO) Take 1 capsule by mouth daily at 6 (six) AM. For wound healing     Multiple Vitamins-Minerals (MULTIVITAMIN WOMEN 50+) TABS Take 1 tablet by mouth daily with breakfast.     Neomycin-Bacitracin-Polymyxin (TRIPLE ANTIBIOTIC) OINT Apply topically daily. To skin tears prn     oxyCODONE  (OXY IR/ROXICODONE ) 5 MG immediate release tablet Take 1 tablet (5 mg total) by mouth every 6 (six) hours as needed. 30 tablet 0   pantoprazole  (PROTONIX ) 20 MG tablet Take 1 tablet (20 mg total) by mouth daily.     potassium chloride  (KLOR-CON ) 20 MEQ packet Take 20 mEq by mouth 2 (two) times daily. 100 packet 1   rosuvastatin  (CRESTOR ) 10 MG tablet Take 10 mg by mouth at bedtime.     sodium bicarbonate  650 MG tablet Take 1 tablet (650 mg total) by mouth 2 (two) times daily.     tiZANidine  (ZANAFLEX ) 4 MG tablet Take 4 mg by mouth 2 (two) times daily as needed for muscle spasms. (Patient not taking: Reported on 04/22/2024)     triamcinolone  (KENALOG) 0.1 % Apply 1 application  topically as needed (for rashes- affected areas). (Patient not taking: Reported on 04/22/2024)       ALLERGIES Ramipril  MEDICAL HISTORY 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   Breast cancer (HCC) 2010   RIGHT   CAD (coronary artery disease)    Dysuria    Family history of breast cancer    GERD  (gastroesophageal reflux disease)    HX OF   Heart murmur    History of blood transfusion    History of breast cancer ONCOLOGIST-- DR CLORETTA   DX 2010--  RIGHT BREAST DCIS  HIGH GRADE S/P LUMPECTOMY W/ SLN BX--  NO RECURRENCE   History of kidney stones    History of ovarian cancer    2009--  S/P COLON RESECTION FOR PAPILLARY SEROUS CARCINOMA, BORDERLINE OVARIAN CANCER VERSUS PRIMARY PERITONEAL CARCINOMA WITH PSAMMOMA BODIES--  NO RECURRENCE   History of skin cancer    excision basal cell   Hyperlipidemia    Hypertension    Kidney stones 04/2013   New onset a-fib (HCC) 11/24/2023   Nocturia    Ovarian carcinoma (HCC)    papillary serous-low grade, recurrence found in fat necrosis during ileostomy   Renal stones 2014  Shingles 30 YRS AGO   Ureteral calculi    bilateral     SOCIAL HISTORY Social History   Socioeconomic History   Marital status: Widowed    Spouse name: Not on file   Number of children: Not on file   Years of education: Not on file   Highest education level: Not on file  Occupational History   Not on file  Tobacco Use   Smoking status: Never   Smokeless tobacco: Never  Vaping Use   Vaping status: Never Used  Substance and Sexual Activity   Alcohol use: No   Drug use: No   Sexual activity: Never    Partners: Male    Birth control/protection: Surgical    Comment: Hysterectomy  Other Topics Concern   Not on file  Social History Narrative   Not on file   Social Drivers of Health   Financial Resource Strain: Not on file  Food Insecurity: No Food Insecurity (04/09/2024)   Hunger Vital Sign    Worried About Running Out of Food in the Last Year: Never true    Ran Out of Food in the Last Year: Never true  Transportation Needs: No Transportation Needs (04/09/2024)   PRAPARE - Administrator, Civil Service (Medical): No    Lack of Transportation (Non-Medical): No  Physical Activity: Not on file  Stress: Not on file  Social Connections:  Moderately Isolated (04/09/2024)   Social Connection and Isolation Panel    Frequency of Communication with Friends and Family: More than three times a week    Frequency of Social Gatherings with Friends and Family: Once a week    Attends Religious Services: More than 4 times per year    Active Member of [employer] or Organizations: No    Attends Banker Meetings: Never    Marital Status: Widowed  Intimate Partner Violence: Not At Risk (04/09/2024)   Humiliation, Afraid, Rape, and Kick questionnaire    Fear of Current or Ex-Partner: No    Emotionally Abused: No    Physically Abused: No    Sexually Abused: No     FAMILY HISTORY Family History  Problem Relation Age of Onset   Heart disease Mother    Hyperlipidemia Mother    Breast cancer Sister    Cancer Sister        breast     Review of Systems: 12 systems reviewed Otherwise as per HPI, all other systems reviewed and negative  Physical Exam: Vitals:   04/22/24 1630 04/22/24 1645  BP: (!) 124/49 (!) 115/39  Pulse: (!) 39 70  Resp: 15 14  SpO2: 95% 100%   No intake/output data recorded. No intake or output data in the 24 hours ending 04/22/24 1659 General: ill-appearing HEENT: anicteric sclera, oropharynx clear without lesions CV: bradycardic Lungs: clear to auscultation bilaterally, normal work of breathing Abd: soft, tender, non-distended Skin: dusky, no visible lesions or rashes Psych: alert, engaged, appropriate mood and affect Musculoskeletal: trace to 1+ pitting edema b/l LEs Neuro: awake, following some commands  Test Results Reviewed Lab Results  Component Value Date   NA 135 04/22/2024   K 7.4 (HH) 04/22/2024   CL 112 (H) 04/22/2024   CO2 17 (L) 04/22/2024   BUN 43 (H) 04/22/2024   CREATININE 3.30 (H) 04/22/2024   CALCIUM  8.7 (L) 04/22/2024   ALBUMIN  3.5 04/22/2024   PHOS 2.6 12/09/2023     I have reviewed all relevant outside healthcare records related to the patient's  kidney injury.

## 2024-04-22 NOTE — Patient Instructions (Signed)
 Fulvestrant Injection What is this medication? FULVESTRANT (ful VES trant) treats breast cancer. It works by blocking the hormone estrogen in breast tissue, which prevents breast cancer cells from spreading or growing. This medicine may be used for other purposes; ask your health care provider or pharmacist if you have questions. COMMON BRAND NAME(S): FASLODEX What should I tell my care team before I take this medication? They need to know if you have any of these conditions: Bleeding disorder Liver disease Low blood cell levels (white cells, red cells, and platelets) An unusual or allergic reaction to fulvestrant, other medications, foods, dyes, or preservatives Pregnant or trying to get pregnant Breastfeeding How should I use this medication? This medication is injected into a muscle. It is given by your care team in a hospital or clinic setting. Talk to your care team about the use of this medication in children. Special care may be needed. Overdosage: If you think you have taken too much of this medicine contact a poison control center or emergency room at once. NOTE: This medicine is only for you. Do not share this medicine with others. What if I miss a dose? Keep appointments for follow-up doses. It is important not to miss your dose. Call your care team if you are unable to keep an appointment. What may interact with this medication? Fluoroestradiol F18 This list may not describe all possible interactions. Give your health care provider a list of all the medicines, herbs, non-prescription drugs, or dietary supplements you use. Also tell them if you smoke, drink alcohol, or use illegal drugs. Some items may interact with your medicine. What should I watch for while using this medication? Your condition will be monitored carefully while you are receiving this medication. You may need blood work done while you are taking this medication. This medication is injected into a muscle. Talk  to your care team if you also take medications that prevent or treat blood clots, such as warfarin. Blood thinners may increase the risk of bleeding or bruising in the muscle where this medication is injected. The benefits of this medication may outweigh the risks. Your care team can help you find the option that works for you. They can also help limit the risk of bleeding. Talk to your care team if you may be pregnant. Serious birth defects can occur if you take this medication during pregnancy and for 1 year after the last dose. You will need a negative pregnancy test before starting this medication. Contraception is recommended while taking this medication and for 1 year after the last dose. Your care team can help you find the option that works for you. Do not breastfeed while taking this medication and for 1 year after the last dose. This medication may cause infertility. Talk to your care team if you are concerned about your fertility. What side effects may I notice from receiving this medication? Side effects that you should report to your care team as soon as possible: Allergic reactions or angioedema--skin rash, itching or hives, swelling of the face, eyes, lips, tongue, arms, or legs, trouble swallowing or breathing Pain, tingling, or numbness in the hands or feet Side effects that usually do not require medical attention (report to your care team if they continue or are bothersome): Bone, joint, or muscle pain Constipation Headache Hot flashes Nausea Pain, redness, or irritation at injection site Unusual weakness or fatigue This list may not describe all possible side effects. Call your doctor for medical advice about side  effects. You may report side effects to FDA at 1-800-FDA-1088. Where should I keep my medication? This medication is given in a hospital or clinic. It will not be stored at home. NOTE: This sheet is a summary. It may not cover all possible information. If you have  questions about this medicine, talk to your doctor, pharmacist, or health care provider.  2024 Elsevier/Gold Standard (2023-05-30 00:00:00)

## 2024-04-22 NOTE — Progress Notes (Signed)
 [facility] OFFICE PROGRESS NOTE   Diagnosis: Breast cancer  INTERVAL HISTORY:   [name] returns for follow-up.  She received a Faslodex  injection 03/26/2024.  She was hospitalized 04/09/2024 through 04/12/2024 with rapid atrial fibrillation, acute on chronic CHF, left lower lobe pneumonia.  She feels she is tolerating Faslodex  well.  She has stable back pain.  She occasionally takes pain medication.  She notes bruising on the forearms since the recent hospitalization.  She denies chest pain.  She has dyspnea on exertion.  Objective:  Vital signs in last 24 hours:  Blood pressure (!) 115/54, pulse 63, temperature 98.2 F (36.8 C), temperature source Temporal, resp. rate 16, height 5' 6 (1.676 m), weight 112 lb 14.4 oz (51.2 kg), last menstrual period 10/07/1974, SpO2 100%.    HEENT: No thrush or ulcers. Resp: Faint rhonchi left lower lung field.  No respiratory distress. Cardio: Regular rate and rhythm. GI: No hepatosplenomegaly. Vascular: Trace pitting lower leg/pedal edema. Neuro: Alert and oriented. Skin: Ecchymoses scattered over the forearms.   Lab Results:  Lab Results  Component Value Date   WBC 11.3 (H) 04/22/2024   HGB 9.2 (L) 04/22/2024   HCT 29.5 (L) 04/22/2024   MCV 93.7 04/22/2024   PLT 228 04/22/2024   NEUTROABS 9.3 (H) 04/22/2024    Imaging:  No results found.  Medications: I have reviewed the patient's current medications.  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 Remote hysterectomy and bilateral salpingo-oophorectomy with the cytology from 1976 confirming numerous psammoma bodies. 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  Status post a needle localized lumpectomy and sentinel lymph node biopsy 04/13/2009 with the pathology confirming high-grade ductal carcinoma in situ with an associated 0.26 mm invasive carcinoma, ER positive, PR positive, and HER2 positive. Status post adjuvant right breast radiation completed 06/19/2009. Initiation of Arimidex  05/01/2009.  Completed 5 years in July 2015 Family history of breast cancer. History of Clostridium difficile colitis. Frequent bowel movements following the bowel resection in 2009. 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 CTs 10/16/2023-innumerable lung nodules both lungs; multiple new ill-defined low-density liver lesions; bony metastatic disease in the thoracic and lumbar spine.  Severe compression fracture at T11 with osseous retropulsion.  Small right greater than left pleural effusions. Biopsy liver lesion 10/30/2023-metastatic moderate to poorly differentiated adenocarcinoma consistent with breast origin; HER2 negative (1+), ER +100%, PR +30%, Ki-67 60%, ESR 1 nutation positive, PIK3CA negative Faslodex /Abemaciclib  11/07/2023, Abemaciclib  discontinued on hospital admission 11/21/2023 Faslodex  12/12/2023, 12/26/2023, 01/08/2024, 02/26/2024 CT 03/25/2024-significant decrease in size and number of diffuse millimetric pulmonary nodules/metastasis.  Marked improvement in appearance of hepatic metastasis.  Superior left breast or left chest wall nodule new since 10/16/2023. Faslodex   continued Elevated platelet count-essential thrombocytosis 07/31/22- postitive [id number] 03/05/2023-hydroxyurea  500 mg Monday, Wednesday, and Friday Hydroxyurea  discontinued March 2025 CHEK2 pathogenic mutation  positive Severe back pain-MRI 10/09/2023 with compression fracture at T11, abnormal signal T9, T10, T11 and T12 as well as several adjacent ribs; right greater than left pleural effusion Palliative radiation to the lower thoracic, lumbar, and upper sacrum 11/18/2023-12/03/2023 12.  Admission 11/21/2023 with acute renal failure, elevated liver enzymes, dehydration, and severe back pain Renal failure and elevated liver enzymes improved, discharged 12/04/2023 13.  Anemia/thrombocytopenia-likely secondary to Abemaciclib , metastatic breast cancer involving the bone marrow, and radiation 14.  Admission 12/05/2023 with chest pain and rapid atrial fibrillation 15.  Admission 12/23/2023 with rapid atrial fibrillation and CHF 16.  Anemia secondary to chronic disease, metastatic breast cancer involving the bone marrow, phlebotomy, and chemotherapy/radiation 17.  Lower leg/foot edema with skin breakdown 02/26/2024-improved 03/26/2024 18.  Hypomagnesemia-oral magnesium  initiated 02/27/2024 19.  Admission 04/08/2024 through 04/12/2024 with rapid atrial fibrillation, CHF, pneumonia  Disposition: [name] appears stable from an oncology standpoint.  Plan to continue monthly Faslodex .  Chemistry panel unexpectedly shows hyperkalemia and progressive renal failure.  Repeat basic metabolic panel with same results.  She has persistent low magnesium .  She has an appointment with nephrology this afternoon.  We contacted her neurologist's office and forwarded the labs from today.  She will return for follow-up and Faslodex  in 4 weeks.  We are available to see her sooner if needed.    Olam Ned ANP/GNP-BC   04/22/2024  10:53 AM

## 2024-04-22 NOTE — H&P (Signed)
 NAME:  [name], MRN:  [id number], DOB:  [birth year], LOS: 0 ADMISSION DATE:  04/22/2024, CONSULTATION DATE:  04/22/24 REFERRING MD:  ED, CHIEF COMPLAINT:  abnormal labs   History of Present Illness:  89 year old presents from oncology clinic with abnormal labs, hyperkalemia and acute renal failure.  She presents to outpatient appointment today.  Otherwise doing okay.  A bit weaker.  A bit altered per daughter report.  Labs resulted with hyperkalemia and acute renal failure.  She was noted be bradycardic.  Sent to the ED.  Bradycardia persisted.  Atropine  was given and rapid improvement and went back down again.  With efforts to shift potassium heart rate improved from the 30s to the 50s 60s.  Repeat potassium remains elevated.  No workup for renal failure was done.  Nephrology was called.  Recommended dialysis.  ICU admission given severe symptomatic hyperkalemia.  Daughter endorses patient's had normal p.o. intake.  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  But diarrhea has been no worse.  Actually a bit better last few days.  She notes that she was quite altered, unable to really communicate.  Currently at time of evaluation she patient was able to communicate with myself and daughter.  This is improved compared to initial presentation in the ED.   Pertinent  Medical History  Metastatic cancer CKD 3B  Significant Hospital Events: Including procedures, antibiotic start and stop dates in addition to other pertinent events     Interim History / Subjective:    Objective    Blood pressure 136/69, pulse (!) 54, temperature (!) 95.3 F (35.2 C), temperature source Temporal, resp. rate 14, last menstrual period 10/07/1974, SpO2 100%.        Intake/Output Summary (Last 24 hours) at 04/22/2024 1829 Last data filed at 04/22/2024 8177 Gross per 24 hour  Intake 1089.35 ml  Output --  Net 1089.35 ml   There were no vitals filed for this  visit.  Examination: General: Chronically ill-appearing lying in stretcher HENT: Prominent EJ bilaterally very distended, moist mucous membranes Lungs: Diminished, normal work of breathing on room air Cardiovascular: Bradycardic, regular Abdomen: Nondistended, diffusely mildly tender no rebound or guarding Extremities: No edema Neuro: Arouses, interacts and communicates appropriately   Resolved problem list   Assessment and Plan   Acute renal failure on ckd3b: Unclear etiology.  Oral intake seems at baseline.  She does appear to be a bit volume overloaded with prominent EJ, do wonder about gradual accumulation of fluid, venous congestion, develop with cardiorenal syndrome.  She has bilateral pleural effusions worse on recent imaging at [facility]. -- Nephrology consulted and recommend dialysis after discussing with -- Daughter temp line placed, plan to start dialysis tonight -- CT abdomen pelvis without contrast to evaluate for possible hydronephrosis/obstruction, bedside bladder scan showed a bladder volume of 0  Hyperkalemia: Presumably related to renal failure and acidemia etc. -- Status post shifting in the ED with IV insulin  and dextrose , Lasix  also administered -- Start bicarbonate drip -- Dialysis as above  Nongap metabolic acidosis: Related to renal failure as well as developing hyperchloremia. -- Assess response with dialysis, bicarbonate  Bradycardia: Presume related to acid-base disturbance, metabolic disturbance hyperkalemia with renal failure -- Improving but still borderline bradycardic -- Dialysis as above  Bilateral pleural effusions: Present 2 weeks prior, likely worsened in the setting of oliguria and anuria renal failure.  Recent imaging June showed good response to cancer therapy, lower suspicion for malignancy. -- Dialysis and assess response,  if persists despite ultrafiltrate would consider thoracentesis -- Repeat CT chest without contrast  Hyperglycemia: --  SSI  Anemia: Presumed of chronic disease, chronically low although a bit lower on admission.  Most recent iron panel 12/2023 consistent with anemia of chronic disease. -- Transfusion goal hemoglobin 7  Metastatic cancer: Breast in origin.  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  Best Practice (right click and Reselect all SmartList Selections daily)   Diet/type: NPO DVT prophylaxis prophylactic heparin   Pressure ulcer(s): pressure ulcer assessment deferred  GI prophylaxis: N/A Lines: Dialysis Catheter and yes and it is still needed Foley:  N/A Code Status:  DNR Last date of multidisciplinary goals of care discussion [Per discussion with ED physician patient DNR, multiple DNR orders in the past, DNR re ordered on admission]  Labs   CBC: Recent Labs  Lab 04/22/24 1041 04/22/24 1612 04/22/24 1624 04/22/24 1708  WBC 11.3* 12.6*  --   --   NEUTROABS 9.3* 9.6*  --   --   HGB 9.2* 9.0* 9.2* 7.8*  HCT 29.5* 29.8* 27.0* 23.0*  MCV 93.7 96.8  --   --   PLT 228 259  --   --     Basic Metabolic Panel: Recent Labs  Lab 04/22/24 1041 04/22/24 1131 04/22/24 1612 04/22/24 1624 04/22/24 1708  NA 137 138 135 135 136  K 6.5* 6.5* 7.4* 7.4* 6.6*  CL 108 109 110 112* 113*  CO2 15* 17* 12*  --   --   GLUCOSE 131* 128* 259* 264* 291*  BUN 45* 44* 46* 43* 43*  CREATININE 2.98* 3.04* 3.09* 3.30* 3.10*  CALCIUM  8.4* 8.7* 8.0*  --   --   MG 1.1*  --  1.1*  --   --    GFR: Estimated Creatinine Clearance: 10.3 mL/min (A) (by C-G formula based on SCr of 3.1 mg/dL (H)). Recent Labs  Lab 04/22/24 1041 04/22/24 1612  WBC 11.3* 12.6*    Liver Function Tests: Recent Labs  Lab 04/22/24 1041 04/22/24 1612  AST 25 49*  ALT 36 39  ALKPHOS 137* 118  BILITOT 0.4 0.6  PROT 6.2* 5.5*  ALBUMIN  3.5 2.5*   No results for input(s): LIPASE, AMYLASE in the last 168 hours. No results for input(s): AMMONIA  in the last 168 hours.  ABG    Component Value Date/Time   PHART 7.445 (H) 02/22/2008 0430   PCO2ART 35.2 02/22/2008 0430   PO2ART 68.4 (L) 02/22/2008 0430   HCO3 23.9 02/22/2008 0430   TCO2 13 (L) 04/22/2024 1708   ACIDBASEDEF 2.9 (H) 02/21/2008 0430   O2SAT 95.1 02/22/2008 0430     Coagulation Profile: No results for input(s): INR, PROTIME in the last 168 hours.  Cardiac Enzymes: No results for input(s): CKTOTAL, CKMB, CKMBINDEX, TROPONINI in the last 168 hours.  HbA1C: No results found for: HGBA1C  CBG: Recent Labs  Lab 04/22/24 1657  GLUCAP 265*    Review of Systems:   She denies chest pain no orthopnea or PND.  Does endorse increased shortness of breath dyspnea on exertion, chronic diarrhea largely unchanged.  Comprehensive review of systems otherwise negative.  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   Surgical History:   Past Surgical History:  Procedure Laterality Date   APPENDECTOMY     BREAST LUMPECTOMY Right 04-13-2009   W/ SLN BX   BREAST LUMPECTOMY WITH RADIOACTIVE SEED LOCALIZATION Left 06/11/2019   Procedure: LEFT BREAST LUMPECTOMY WITH RADIOACTIVE SEED LOCALIZATION;  Surgeon: Curvin Deward MOULD, MD;  Location: [facility] OR;  Service: General;  Laterality: Left;   CARDIAC CATHETERIZATION  11-11-2005 DR VICTORY SHARPS   MODERATE LAD DISEASE/ NORMAL LVF/ EF 65-75%   CATARACT  EXTRACTION W/ INTRAOCULAR LENS IMPLANT Bilateral 04/26/2013   CHOLECYSTECTOMY  1990   OPEN   COLONOSCOPY     CYSTOSCOPY W/ URETERAL STENT PLACEMENT Bilateral 05/07/2013   Procedure: CYSTOSCOPY WITH STENT REPLACEMENTS;  Surgeon: Ricardo Likens, MD;  Location: [facility];  Service: Urology;  Laterality: Bilateral;   CYSTOSCOPY WITH BIOPSY N/A 04/07/2013   Procedure: CYSTOSCOPY WITH BIOPSY and fulgeration;  Surgeon: Ricardo Likens, MD;  Location: [facility];  Service: Urology;  Laterality: N/A;   CYSTOSCOPY WITH RETROGRADE PYELOGRAM, URETEROSCOPY AND STENT PLACEMENT Bilateral 05/07/2013   Procedure: CYSTOSCOPY WITH RETROGRADE PYELOGRAM, URETEROSCOPY ;  Surgeon: Ricardo Likens, MD;  Location: [facility];  Service: Urology;  Laterality: Bilateral;   CYSTOSCOPY WITH RETROGRADE PYELOGRAM, URETEROSCOPY AND STENT PLACEMENT Bilateral 01/06/2019   Procedure: CYSTOSCOPY WITH RETROGRADE PYELOGRAM, URETEROSCOPY AND STENT PLACEMENT, FIRST STAGE;  Surgeon: Likens Ricardo, MD;  Location: [facility];  Service: Urology;  Laterality: Bilateral;   CYSTOSCOPY WITH RETROGRADE PYELOGRAM, URETEROSCOPY AND STENT PLACEMENT Bilateral 01/27/2019   Procedure: CYSTOSCOPY WITH RETROGRADE PYELOGRAM, URETEROSCOPY AND STENT PLACEMENT;  Surgeon: Likens Ricardo, MD;  Location: [facility];  Service: Urology;  Laterality: Bilateral;  75 MINS   CYSTOSCOPY WITH STENT PLACEMENT Bilateral 04/07/2013   Procedure: CYSTOSCOPY WITH STENT PLACEMENT;  Surgeon: Ricardo Likens, MD;  Location: [facility];  Service: Urology;  Laterality: Bilateral;   DX LAPAROSCOPY W/ PERITONEAL AND OMENTAL BX'S AND WASHINGS  02-16-2008   EXCISION RIGHT BREAST MASS  02-10-2002   EXP. LAP. EXTENSIVE ADHESIOLYSIS/ RESECTION TERMINAL ILEUM , ASCENDING AND DESCENDING COLON WITH CREATION ILEOSTOMY AND MUCOUS FISTULA  02-19-2008   PERFERATION AND ABD. CANCER--  TAKEDOWN ILEOSTOMY 08-23-2008   HOLMIUM LASER APPLICATION Bilateral 01/06/2019   Procedure: HOLMIUM LASER  APPLICATION;  Surgeon: Likens Ricardo, MD;  Location: [facility];  Service: Urology;  Laterality: Bilateral;   HOLMIUM LASER APPLICATION Bilateral 01/27/2019   Procedure: HOLMIUM LASER APPLICATION;  Surgeon: Likens Ricardo, MD;  Location: [facility];  Service: Urology;  Laterality: Bilateral;   I & D EXTREMITY Right 02/02/2022   Procedure: IRRIGATION AND DEBRIDEMENT HAND;  Surgeon: Shari Easter, MD;  Location: [facility] OR;  Service: Orthopedics;  Laterality: Right;   ILEOSTOMY CLOSURE  08/2008   TOTAL ABDOMINAL HYSTERECTOMY W/ BILATERAL SALPINGOOPHORECTOMY  1976   TOTAL ABDOMINAL HYSTERECTOMY W/ BILATERAL SALPINGOOPHORECTOMY  1976   VULVAR LESION REMOVAL N/A 08/22/2014   Procedure: EXCISION OF VULVAR CYST  ;  Surgeon: Ronal Elvie Pinal, MD;  Location: [facility];  Service: Gynecology;  Laterality: N/A;   WOUND DEBRIDEMENT  05/28/2012   Procedure: DEBRIDEMENT ABDOMINAL WOUND;  Surgeon: Sherlean JINNY Laughter, MD;  Location: [facility];  Service: General;  Laterality: N/A;  excision chronic wound abdominal wall     Social History:   reports that she has never smoked.  She has never used smokeless tobacco. She reports that she does not drink alcohol and does not use drugs.   Family History:  Her family history includes Breast cancer in her sister; Cancer in her sister; Heart disease in her mother; Hyperlipidemia in her mother.   Allergies Allergies  Allergen Reactions   Ramipril Itching     Home Medications  Prior to Admission medications   Medication Sig Start Date End Date Taking? Authorizing Provider  aspirin  EC 81 MG tablet Take 81 mg by mouth daily.    [provider]  BELSOMRA 10 MG TABS Take 10 mg by mouth at bedtime. 01/20/19   [provider]  Calcium  Carbonate-Vitamin D  (CALTRATE 600+D PO) Take 1 tablet by mouth 2 (two) times daily.    [provider]  Cholecalciferol  (VITAMIN D3) 50 MCG (2000 UT) TABS Take 2,000 Units by mouth in the morning and at bedtime.     [provider]  cyanocobalamin  (VITAMIN B12) 1000 MCG tablet Take 1,000 mcg by mouth daily.    [provider]  diltiazem  (TIAZAC ) 360 MG 24 hr capsule Take 360 mg by mouth daily. 01/29/24   [provider]  furosemide  (LASIX ) 40 MG tablet Take 1 tablet (40 mg total) by mouth daily. 04/12/24 04/12/25  Fairy Frames, MD  hydroxychloroquine  (PLAQUENIL ) 200 MG tablet Take 200 mg by mouth daily. 11/08/21   [provider]  isosorbide  mononitrate (IMDUR ) 60 MG 24 hr tablet Take 1 tablet (60 mg total) by mouth daily. 05/16/23   Jeffrie Oneil BROCKS, MD  loperamide  (IMODIUM ) 2 MG capsule Take 1 capsule (2 mg total) by mouth as needed for diarrhea or loose stools. Patient not taking: Reported on 04/22/2024 12/04/23   Ezenduka, Nkeiruka J, MD  magnesium  oxide (MAG-OX) 400 MG tablet Take 2 tablets (800 mg total) by mouth 2 (two) times daily. 02/27/24   [name], MD  melatonin 3 MG TABS tablet Take 1 tablet (3 mg total) by mouth at bedtime as needed. 12/29/23   [name], MD  metoprolol  tartrate (LOPRESSOR ) 50 MG tablet Take 1 tablet (50 mg total) by mouth 2 (two) times daily. 01/20/24 04/22/24  Parthenia Olivia HERO, PA-C  Multiple Vitamins-Minerals (DECUBI-VITE PO) Take 1 capsule by mouth daily at 6 (six) AM. For wound healing    [provider]  Multiple Vitamins-Minerals (MULTIVITAMIN WOMEN 50+) TABS Take 1 tablet by mouth daily with breakfast.    [provider]  Neomycin-Bacitracin-Polymyxin (TRIPLE ANTIBIOTIC) OINT Apply topically daily. To skin tears prn    [provider]  oxyCODONE  (OXY IR/ROXICODONE ) 5 MG immediate release tablet Take 1 tablet (5 mg total) by mouth every 6 (six) hours as needed. 03/26/24   [name], NP  pantoprazole  (PROTONIX ) 20 MG tablet Take 1 tablet (20 mg total) by mouth daily. 12/05/23   Ezenduka, Nkeiruka J, MD  potassium chloride  (KLOR-CON ) 20 MEQ packet Take 20 mEq by mouth 2 (two) times daily. 01/27/24   Jeffrie Oneil BROCKS,  MD  rosuvastatin  (CRESTOR ) 10 MG tablet Take 10 mg by mouth at bedtime.    [provider]  sodium bicarbonate  650 MG tablet Take 1 tablet (650 mg total) by mouth 2 (two) times daily. 12/08/23   [name], [name], MD  tiZANidine  (ZANAFLEX ) 4 MG tablet Take 4 mg by mouth 2 (two) times daily as needed for muscle spasms. Patient not taking: Reported on 04/22/2024 10/10/23   [provider]  triamcinolone  (KENALOG) 0.1 % Apply 1 application  topically as needed (for rashes- affected areas). Patient not taking: Reported on 04/22/2024 04/04/20   [provider]     Critical care time:     CRITICAL CARE Performed by: Donnice JONELLE Beals   Total critical care time: 40 minutes  Critical care time was exclusive of separately billable procedures and treating other patients.  Critical care was necessary to treat or prevent imminent or life-threatening deterioration.  Critical care was time spent personally by me on the following activities: development of treatment plan with patient and/or surrogate as well as nursing, discussions with consultants, evaluation of patient's response to treatment, examination of patient, obtaining history from patient or surrogate, ordering and performing treatments and interventions, ordering and review of laboratory studies, ordering and review of radiographic studies, pulse oximetry and re-evaluation of patient's condition.  Donnice JONELLE Beals, MD See TRACEY

## 2024-04-22 NOTE — ED Notes (Signed)
 Pt in bed, pt pale and modeled, pt awake and oriented.

## 2024-04-22 NOTE — Consult Note (Incomplete)
 Nephrology Consult   Requesting provider: *** Service requesting consult: *** Reason for consult: ***   Assessment/Recommendations: [name] is a/an 89 y.o. female with a past medical history notable for AKI    Non-Oliguric/Anuric AKI ({Stable, unstable, improved, unchanged:38948}): Likely secondary to *** -Chart reviewed: (***medications acceptable, does not appear to have been exposed to nephrotoxins with imaging or had episodes of significant hypotension).   -Continue to monitor daily Cr, Dose meds for GFR<15 -Monitor Daily I/Os, Daily weight  -May obtain urine sample for sediment analysis if AKI fails to improve with efforts stated above*** -Maintain MAP>65 for optimal renal perfusion.  -Agree with holding ACE-I, avoid further nephrotoxins including NSAIDS, Morphine .  Unless absolutely necessary, avoid CT with contrast and/or MRI with gadolinium.    -Check Renal U/S to rule out obstruction*** -Currently no indication for HD***  Volume Status: Appears {volume status:54398} on exam. Based on our examination and review of available imaging, our recommendation is ***.  Hypertension:  Electrolytes ***: K+ ***, Na+ ***. Based on the *** level of ***, our recommendation is ***.  # Anemia due to {AnemiaCause:47750}: -Transfuse for Hgb<7 g/dL -***No IV iron -***No role for ESA in setting of AKI  Uncontrolled Diabetes Mellitus Type 2 with Hyperglycemia***  Medical Problem***   Recommendations conveyed to primary service.    Ephriam Stank [facility] 04/22/2024 4:34 PM   _____________________________________________________________________________________   History of Present Illness: [name] is a/an 89 y.o. female with a past medical history of *** who presents to *** with ***.   Medications:  Current Facility-Administered Medications  Medication Dose Route Frequency Provider Last Rate Last Admin   albuterol  (PROVENTIL ) (2.5 MG/3ML) 0.083%  nebulizer solution 2.5 mg  2.5 mg Nebulization Once Mannie Pac T, DO       calcium  gluconate 10 % injection            dextrose  50 % solution 25 mL  25 mL Intravenous Once Mannie Pac T, DO       insulin  aspart (novoLOG ) injection 10 Units  10 Units Intravenous Once Mannie Pac T, DO       lactated ringers  bolus 1,000 mL  1,000 mL Intravenous Once Mannie Pac T, DO       magnesium  sulfate IVPB 2 g 50 mL  2 g Intravenous Once Mannie Pac T, DO 50 mL/hr at 04/22/24 1631 2 g at 04/22/24 1631   Current Outpatient Medications  Medication Sig Dispense Refill   aspirin  EC 81 MG tablet Take 81 mg by mouth daily.     BELSOMRA 10 MG TABS Take 10 mg by mouth at bedtime.     Calcium  Carbonate-Vitamin D  (CALTRATE 600+D PO) Take 1 tablet by mouth 2 (two) times daily.     Cholecalciferol  (VITAMIN D3) 50 MCG (2000 UT) TABS Take 2,000 Units by mouth in the morning and at bedtime.     cyanocobalamin  (VITAMIN B12) 1000 MCG tablet Take 1,000 mcg by mouth daily.     diltiazem  (TIAZAC ) 360 MG 24 hr capsule Take 360 mg by mouth daily.     furosemide  (LASIX ) 40 MG tablet Take 1 tablet (40 mg total) by mouth daily.     hydroxychloroquine  (PLAQUENIL ) 200 MG tablet Take 200 mg by mouth daily.     isosorbide  mononitrate (IMDUR ) 60 MG 24 hr tablet Take 1 tablet (60 mg total) by mouth daily. 90 tablet 2   loperamide  (IMODIUM ) 2 MG capsule Take 1 capsule (2 mg total) by mouth as  needed for diarrhea or loose stools. (Patient not taking: Reported on 04/22/2024)     magnesium  oxide (MAG-OX) 400 MG tablet Take 2 tablets (800 mg total) by mouth 2 (two) times daily. 120 tablet 0   melatonin 3 MG TABS tablet Take 1 tablet (3 mg total) by mouth at bedtime as needed.     metoprolol  tartrate (LOPRESSOR ) 50 MG tablet Take 1 tablet (50 mg total) by mouth 2 (two) times daily. 180 tablet 3   Multiple Vitamins-Minerals (DECUBI-VITE PO) Take 1 capsule by mouth daily at 6 (six) AM. For wound healing     Multiple  Vitamins-Minerals (MULTIVITAMIN WOMEN 50+) TABS Take 1 tablet by mouth daily with breakfast.     Neomycin-Bacitracin-Polymyxin (TRIPLE ANTIBIOTIC) OINT Apply topically daily. To skin tears prn     oxyCODONE  (OXY IR/ROXICODONE ) 5 MG immediate release tablet Take 1 tablet (5 mg total) by mouth every 6 (six) hours as needed. 30 tablet 0   pantoprazole  (PROTONIX ) 20 MG tablet Take 1 tablet (20 mg total) by mouth daily.     potassium chloride  (KLOR-CON ) 20 MEQ packet Take 20 mEq by mouth 2 (two) times daily. 100 packet 1   rosuvastatin  (CRESTOR ) 10 MG tablet Take 10 mg by mouth at bedtime.     sodium bicarbonate  650 MG tablet Take 1 tablet (650 mg total) by mouth 2 (two) times daily.     tiZANidine  (ZANAFLEX ) 4 MG tablet Take 4 mg by mouth 2 (two) times daily as needed for muscle spasms. (Patient not taking: Reported on 04/22/2024)     triamcinolone  (KENALOG) 0.1 % Apply 1 application  topically as needed (for rashes- affected areas). (Patient not taking: Reported on 04/22/2024)       ALLERGIES Ramipril  MEDICAL HISTORY 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   Breast cancer (HCC) 2010   RIGHT   CAD (coronary artery disease)    Dysuria    Family history of breast cancer    GERD (gastroesophageal reflux disease)    HX OF   Heart murmur    History of blood transfusion    History of breast cancer ONCOLOGIST-- DR CLORETTA   DX 2010--  RIGHT BREAST DCIS  HIGH GRADE S/P LUMPECTOMY W/ SLN BX--  NO RECURRENCE   History of kidney stones    History of ovarian cancer    2009--  S/P COLON RESECTION FOR PAPILLARY SEROUS CARCINOMA, BORDERLINE OVARIAN CANCER VERSUS PRIMARY PERITONEAL CARCINOMA WITH PSAMMOMA BODIES--  NO RECURRENCE   History of skin cancer    excision basal cell   Hyperlipidemia    Hypertension    Kidney stones 04/2013   New onset a-fib (HCC) 11/24/2023   Nocturia    Ovarian carcinoma (HCC)    papillary  serous-low grade, recurrence found in fat necrosis during ileostomy   Renal stones 2014   Shingles 30 YRS AGO   Ureteral calculi    bilateral     SOCIAL HISTORY Social History   Socioeconomic History   Marital status: Widowed    Spouse name: Not on file   Number of children: Not on file   Years of education: Not on file   Highest education level: Not on file  Occupational History   Not on file  Tobacco Use   Smoking status: Never   Smokeless tobacco: Never  Vaping Use   Vaping status: Never Used  Substance and Sexual Activity  Alcohol use: No   Drug use: No   Sexual activity: Never    Partners: Male    Birth control/protection: Surgical    Comment: Hysterectomy  Other Topics Concern   Not on file  Social History Narrative   Not on file   Social Drivers of Health   Financial Resource Strain: Not on file  Food Insecurity: No Food Insecurity (04/09/2024)   Hunger Vital Sign    Worried About Running Out of Food in the Last Year: Never true    Ran Out of Food in the Last Year: Never true  Transportation Needs: No Transportation Needs (04/09/2024)   PRAPARE - Administrator, Civil Service (Medical): No    Lack of Transportation (Non-Medical): No  Physical Activity: Not on file  Stress: Not on file  Social Connections: Moderately Isolated (04/09/2024)   Social Connection and Isolation Panel    Frequency of Communication with Friends and Family: More than three times a week    Frequency of Social Gatherings with Friends and Family: Once a week    Attends Religious Services: More than 4 times per year    Active Member of [employer] or Organizations: No    Attends Banker Meetings: Never    Marital Status: Widowed  Intimate Partner Violence: Not At Risk (04/09/2024)   Humiliation, Afraid, Rape, and Kick questionnaire    Fear of Current or Ex-Partner: No    Emotionally Abused: No    Physically Abused: No    Sexually Abused: No     FAMILY  HISTORY Family History  Problem Relation Age of Onset   Heart disease Mother    Hyperlipidemia Mother    Breast cancer Sister    Cancer Sister        breast     ***No family history of kidney disease  Review of Systems: 12 systems reviewed Otherwise as per HPI, all other systems reviewed and negative  Physical Exam: Vitals:   04/22/24 1607  BP: (!) 135/52  Pulse: (!) 58  Resp: 17  SpO2: 100%   No intake/output data recorded. No intake or output data in the 24 hours ending 04/22/24 1634 General: well-appearing, no acute distress HEENT: anicteric sclera, oropharynx clear without lesions CV: regular rate, normal rhythm, no murmurs, no gallops, no rubs, ***no peripheral edema Lungs: ***clear to auscultation bilaterally, normal work of breathing Abd: soft, non-tender, non-distended Skin: no visible lesions or rashes Psych: alert, engaged, appropriate mood and affect Musculoskeletal: ***no obvious deformities Neuro: normal speech, no gross focal deficits   Test Results Reviewed Lab Results  Component Value Date   NA 135 04/22/2024   K 7.4 (HH) 04/22/2024   CL 112 (H) 04/22/2024   CO2 17 (L) 04/22/2024   BUN 43 (H) 04/22/2024   CREATININE 3.30 (H) 04/22/2024   CALCIUM  8.7 (L) 04/22/2024   ALBUMIN  3.5 04/22/2024   PHOS 2.6 12/09/2023     I have reviewed all relevant outside healthcare records related to the patient's kidney injury.

## 2024-04-22 NOTE — ED Triage Notes (Signed)
 Pt to er via ems, per ems pt was at md office and had a loc, found brady and hypotensive, pt was given atropine , ej iv and 500 of LR, pt awake and answering questions.

## 2024-04-22 NOTE — ED Notes (Signed)
 Pt in bed, pt became very brady on monitor, md notified, md and pharm and multiple rn at bedside,

## 2024-04-22 NOTE — Progress Notes (Addendum)
 eLink Physician-Brief Progress Note Patient Name: [name] DOB: [birth year] MRN: [id number]   Date of Service  04/22/2024  HPI/Events of Note  89 year old with a history of stage III, bilateral pleural effusions, metastatic breast cancer since 2009 presents from oncology clinic with abnormal labs, hyperkalemia and acute renal failure with anticipated urgent dialysis.  Vital signs with some bradycardia saturating 96% on room air.  Results show hyperkalemia, elevated creatinine, hyperglycemia and metabolic acidosis.  Severe hypomagnesemia and normocytic anemia.  Elevated TSH.  Bilateral nonobstructing stones without hydronephrosis.  Bilateral basilar consolidations with pleural effusions.  eICU Interventions  Emergent HD planned in the setting of arrhythmia.  Temporizing measures performed.  Bicarbonate drip in the interim. Check uric acid/LDH  Additional magnesium  sulfate ordered  Add heparin  subcu for DVT prophylaxis GI prophylaxis not currently indicated DNR/DNI noted   0000 -CT abdomen order from 1741 was released at 2306.  CT renal study has already been performed and the patient is currently getting dialysis.  Duplicate order, discontinued  Intervention Category Evaluation Type: New Patient Evaluation  [name] 04/22/2024, 7:26 PM

## 2024-04-22 NOTE — ED Provider Notes (Signed)
 [name] Provider Note   CSN: [id number] Arrival date & time: 04/22/24  1601     Patient presents with: Dizziness   [name] is a 89 y.o. female.    Dizziness  28-year-old female history of metastatic breast and ovarian cancer, chronic kidney disease who is here today after she had a syncopal episode leaving her oncology appointment.  Patient found to be bradycardic by EMS.  They gave atropine .  They report was patient had an elevated potassium at her oncology appointment.  Upon arrival to the emergency room, patient mentating, heart rate in the 30s, normotensive.    Prior to Admission medications   Medication Sig Start Date End Date Taking? Authorizing Provider  aspirin  EC 81 MG tablet Take 81 mg by mouth daily.    [provider]  BELSOMRA 10 MG TABS Take 10 mg by mouth at bedtime. 01/20/19   [provider]  Calcium  Carbonate-Vitamin D  (CALTRATE 600+D PO) Take 1 tablet by mouth 2 (two) times daily.    [provider]  Cholecalciferol  (VITAMIN D3) 50 MCG (2000 UT) TABS Take 2,000 Units by mouth in the morning and at bedtime.    [provider]  cyanocobalamin  (VITAMIN B12) 1000 MCG tablet Take 1,000 mcg by mouth daily.    [provider]  diltiazem  (TIAZAC ) 360 MG 24 hr capsule Take 360 mg by mouth daily. 01/29/24   [provider]  furosemide  (LASIX ) 40 MG tablet Take 1 tablet (40 mg total) by mouth daily. 04/12/24 04/12/25  Fairy Frames, MD  hydroxychloroquine  (PLAQUENIL ) 200 MG tablet Take 200 mg by mouth daily. 11/08/21   [provider]  isosorbide  mononitrate (IMDUR ) 60 MG 24 hr tablet Take 1 tablet (60 mg total) by mouth daily. 05/16/23   Alison Oneil BROCKS, MD  loperamide  (IMODIUM ) 2 MG capsule Take 1 capsule (2 mg total) by mouth as needed for diarrhea or loose stools. Patient not taking: Reported on 04/22/2024 12/04/23   Powell, Alison J, MD  magnesium  oxide  (MAG-OX) 400 MG tablet Take 2 tablets (800 mg total) by mouth 2 (two) times daily. 02/27/24   [name], MD  melatonin 3 MG TABS tablet Take 1 tablet (3 mg total) by mouth at bedtime as needed. 12/29/23   [name], MD  metoprolol  tartrate (LOPRESSOR ) 50 MG tablet Take 1 tablet (50 mg total) by mouth 2 (two) times daily. 01/20/24 04/22/24  Alison Olivia CHRISTELLA, PA-C  Multiple Vitamins-Minerals (DECUBI-VITE PO) Take 1 capsule by mouth daily at 6 (six) AM. For wound healing    [provider]  Multiple Vitamins-Minerals (MULTIVITAMIN WOMEN 50+) TABS Take 1 tablet by mouth daily with breakfast.    [provider]  Neomycin-Bacitracin-Polymyxin (TRIPLE ANTIBIOTIC) OINT Apply topically daily. To skin tears prn    [provider]  oxyCODONE  (OXY IR/ROXICODONE ) 5 MG immediate release tablet Take 1 tablet (5 mg total) by mouth every 6 (six) hours as needed. 03/26/24   Alison Olam POUR, NP  pantoprazole  (PROTONIX ) 20 MG tablet Take 1 tablet (20 mg total) by mouth daily. 12/05/23   Powell, Alison J, MD  potassium chloride  (KLOR-CON ) 20 MEQ packet Take 20 mEq by mouth 2 (two) times daily. 01/27/24   Alison Oneil BROCKS, MD  rosuvastatin  (CRESTOR ) 10 MG tablet Take 10 mg by mouth at bedtime.    [provider]  sodium bicarbonate  650 MG tablet Take 1 tablet (650 mg total) by mouth 2 (two) times  daily. 12/08/23   [name], [name], MD  tiZANidine  (ZANAFLEX ) 4 MG tablet Take 4 mg by mouth 2 (two) times daily as needed for muscle spasms. Patient not taking: Reported on 04/22/2024 10/10/23   [provider]  triamcinolone  (KENALOG) 0.1 % Apply 1 application  topically as needed (for rashes- affected areas). Patient not taking: Reported on 04/22/2024 04/04/20   [provider]    Allergies: Ramipril    Review of Systems  Neurological:  Positive for dizziness.    Updated Vital Signs BP (!) 123/46   Pulse (!) 48   Resp 16   LMP 10/07/1974   SpO2 100%   Physical  Exam Vitals reviewed.  Constitutional:      Comments: Frail  HENT:     Mouth/Throat:     Mouth: Mucous membranes are dry.  Cardiovascular:     Rate and Rhythm: Bradycardia present. Rhythm irregular.  Abdominal:     General: Abdomen is flat. There is no distension.     Palpations: Abdomen is soft.  Musculoskeletal:        General: No deformity.  Skin:    General: Skin is warm.  Neurological:     Mental Status: She is alert and oriented to person, place, and time.     Cranial Nerves: No cranial nerve deficit.     (all labs ordered are listed, but only abnormal results are displayed) Labs Reviewed  COMPREHENSIVE METABOLIC PANEL WITH GFR - Abnormal; Notable for the following components:      Result Value   Potassium 7.4 (*)    CO2 12 (*)    Glucose, Bld 259 (*)    BUN 46 (*)    Creatinine, Ser 3.09 (*)    Calcium  8.0 (*)    Total Protein 5.5 (*)    Albumin  2.5 (*)    AST 49 (*)    GFR, Estimated 14 (*)    All other components within normal limits  CBC WITH DIFFERENTIAL/PLATELET - Abnormal; Notable for the following components:   WBC 12.6 (*)    RBC 3.08 (*)    Hemoglobin 9.0 (*)    HCT 29.8 (*)    RDW 16.9 (*)    Neutro Abs 9.6 (*)    Abs Immature Granulocytes 0.37 (*)    All other components within normal limits  MAGNESIUM  - Abnormal; Notable for the following components:   Magnesium  1.1 (*)    All other components within normal limits  I-STAT CHEM 8, ED - Abnormal; Notable for the following components:   Potassium 7.4 (*)    Chloride 112 (*)    BUN 43 (*)    Creatinine, Ser 3.30 (*)    Glucose, Bld 264 (*)    Calcium , Ion 1.13 (*)    TCO2 12 (*)    Hemoglobin 9.2 (*)    HCT 27.0 (*)    All other components within normal limits  I-STAT CHEM 8, ED - Abnormal; Notable for the following components:   Potassium 6.6 (*)    Chloride 113 (*)    BUN 43 (*)    Creatinine, Ser 3.10 (*)    Glucose, Bld 291 (*)    TCO2 13 (*)    Hemoglobin 7.8 (*)    HCT 23.0 (*)     All other components within normal limits  CBG MONITORING, ED - Abnormal; Notable for the following components:   Glucose-Capillary 265 (*)    All other components within normal limits  AMMONIA  TSH  URINALYSIS, COMPLETE (UACMP) WITH MICROSCOPIC  HEPATITIS B SURFACE ANTIGEN  HEPATITIS B SURFACE ANTIBODY, QUANTITATIVE    EKG: None  Radiology: No results found.   .Critical Care  Performed by: Mannie Fairy DASEN, DO Authorized by: Mannie Fairy DASEN, DO   Critical care provider statement:    Critical care time (minutes):  79   Critical care was necessary to treat or prevent imminent or life-threatening deterioration of the following conditions:  Metabolic crisis   Critical care was time spent personally by me on the following activities:  Development of treatment plan with patient or surrogate, discussions with consultants, evaluation of patient's response to treatment, examination of patient, ordering and review of laboratory studies, ordering and review of radiographic studies, ordering and performing treatments and interventions, pulse oximetry, re-evaluation of patient's condition and review of old charts    Medications Ordered in the ED  magnesium  sulfate IVPB 2 g 50 mL (2 g Intravenous New Bag/Given 04/22/24 1631)  sodium zirconium cyclosilicate  (LOKELMA ) packet 10 g (10 g Oral Not Given 04/22/24 1647)  calcium  gluconate 1 g/ 50 mL sodium chloride  IVPB (1,000 mg Intravenous New Bag/Given 04/22/24 1658)  furosemide  (LASIX ) injection 80 mg (has no administration in time range)  Chlorhexidine  Gluconate Cloth 2 % PADS 6 each (has no administration in time range)  heparin  sodium (porcine) injection 4,000 Units (has no administration in time range)  insulin  aspart (novoLOG ) injection 5 Units (has no administration in time range)  insulin  aspart (novoLOG ) injection 10 Units (10 Units Intravenous Given 04/22/24 1622)  albuterol  (PROVENTIL ) (2.5 MG/3ML) 0.083% nebulizer solution 2.5 mg  (2.5 mg Nebulization Given 04/22/24 1632)  calcium  gluconate inj 10% (1 g) URGENT USE ONLY! (1 g Intravenous Given 04/22/24 1623)  calcium  gluconate inj 10% (1 g) URGENT USE ONLY! (1 g Intravenous Given 04/22/24 1615)  atropine  1 MG/10ML injection 1 mg (1 mg Intravenous Given 04/22/24 1618)  sodium chloride  0.9 % bolus 1,000 mL (1,000 mLs Intravenous New Bag/Given 04/22/24 1610)  dextrose  50 % solution 50 mL (50 mLs Intravenous Given 04/22/24 1620)  albuterol  (PROVENTIL ) (2.5 MG/3ML) 0.083% nebulizer solution 2.5 mg (2.5 mg Nebulization Given 04/22/24 1639)                                    Medical Decision Making 89 year old female with metastatic cancer, hyperkalemic and bradycardic.  Plan-patient's bradycardia likely due to metabolic abnormalities.  K on her i-STAT was 7.4.  Started patient on 2 g of calcium  gluconate, albuterol , 10 units of insulin  with dextrose .  Spoke with Dr. Dennise of nephrology who agreed with dialysis for the patient.  Patient is a DNR/DNI, however still undergoing treatment and wishes to pursue dialysis.  Her daughter is here at bedside.  Will plan to admit patient to ICU for dialysis  Reassessment 5:20 PM-patient's heart rate is improved to the 50s, i-STAT repeat shows a K of 6.6..  Be trending in the right direction.  Patient admitted to ICU.  Amount and/or Complexity of Data Reviewed Labs: ordered.  Risk Prescription drug management.        Final diagnoses:  Hyperkalemia  Acute renal failure, unspecified acute renal failure type [facility])    ED Discharge Orders     None          Mannie Fairy T, DO 04/22/24 1722

## 2024-04-22 NOTE — Progress Notes (Signed)
 CRITICAL VALUE STICKER  CRITICAL VALUE:Potassium 6.5   RECEIVER (on-site recipient of call):Jaqlyn Gruenhagen M  DATE & TIME NOTIFIED: 04/22/2024  MESSENGER (representative from lab):JJ  MD NOTIFIED: Olam MARLA Ned   TIME OF NOTIFICATION:04/22/2024  RESPONSE: repeat BMP

## 2024-04-22 NOTE — ED Notes (Signed)
 Pt's color has improved, pt awake, resps even and unlabored

## 2024-04-22 NOTE — Procedures (Addendum)
 Central Venous Catheter Insertion Procedure Note  [name]  [id number]  1934-09-29  Date:04/22/24  Time:6:27 PM   Provider Performing:Angad Nabers R Peggy Loge   Procedure: Insertion of Non-tunneled Central Venous Catheter(36556)with US  guidance (23062)    Indication(s) Hemodialysis  Consent Risks of the procedure as well as the alternatives and risks of each were explained to the patient and/or caregiver.  Consent for the procedure was obtained verbally from daughter at bedside.  Anesthesia Topical only with 1% lidocaine    Timeout Verified patient identification, verified procedure, site/side was marked, verified correct patient position, special equipment/implants available, medications/allergies/relevant history reviewed, required imaging and test results available.  Sterile Technique Maximal sterile technique including full sterile barrier drape, hand hygiene, sterile gown, sterile gloves, mask, hair covering, sterile ultrasound probe cover (if used).  Procedure Description Area of catheter insertion was cleaned with chlorhexidine  and draped in sterile fashion.   With real-time ultrasound guidance a HD catheter was placed into the left internal jugular vein.  Nonpulsatile blood flow and easy flushing noted in all ports.  The catheter was sutured in place and sterile dressing applied.  Complications/Tolerance CT chest is ordered to verify placement for internal jugular   EBL Minimal  Specimen(s) None

## 2024-04-23 ENCOUNTER — Other Ambulatory Visit: Payer: Self-pay

## 2024-04-23 ENCOUNTER — Inpatient Hospital Stay (HOSPITAL_COMMUNITY)

## 2024-04-23 DIAGNOSIS — E875 Hyperkalemia: Secondary | ICD-10-CM | POA: Diagnosis not present

## 2024-04-23 DIAGNOSIS — I10 Essential (primary) hypertension: Secondary | ICD-10-CM

## 2024-04-23 DIAGNOSIS — N179 Acute kidney failure, unspecified: Secondary | ICD-10-CM

## 2024-04-23 DIAGNOSIS — I129 Hypertensive chronic kidney disease with stage 1 through stage 4 chronic kidney disease, or unspecified chronic kidney disease: Secondary | ICD-10-CM | POA: Diagnosis not present

## 2024-04-23 DIAGNOSIS — I4892 Unspecified atrial flutter: Secondary | ICD-10-CM | POA: Diagnosis not present

## 2024-04-23 DIAGNOSIS — R001 Bradycardia, unspecified: Secondary | ICD-10-CM | POA: Diagnosis not present

## 2024-04-23 DIAGNOSIS — N1832 Chronic kidney disease, stage 3b: Secondary | ICD-10-CM | POA: Diagnosis not present

## 2024-04-23 DIAGNOSIS — R531 Weakness: Secondary | ICD-10-CM | POA: Diagnosis not present

## 2024-04-23 DIAGNOSIS — I639 Cerebral infarction, unspecified: Secondary | ICD-10-CM | POA: Diagnosis not present

## 2024-04-23 LAB — CBC
HCT: 27.8 % — ABNORMAL LOW (ref 36.0–46.0)
Hemoglobin: 9.1 g/dL — ABNORMAL LOW (ref 12.0–15.0)
MCH: 29.2 pg (ref 26.0–34.0)
MCHC: 32.7 g/dL (ref 30.0–36.0)
MCV: 89.1 fL (ref 80.0–100.0)
Platelets: 209 K/uL (ref 150–400)
RBC: 3.12 MIL/uL — ABNORMAL LOW (ref 3.87–5.11)
RDW: 16.3 % — ABNORMAL HIGH (ref 11.5–15.5)
WBC: 12.1 K/uL — ABNORMAL HIGH (ref 4.0–10.5)
nRBC: 0 % (ref 0.0–0.2)

## 2024-04-23 LAB — COMPREHENSIVE METABOLIC PANEL WITH GFR
ALT: 104 U/L — ABNORMAL HIGH (ref 0–44)
AST: 116 U/L — ABNORMAL HIGH (ref 15–41)
Albumin: 2.7 g/dL — ABNORMAL LOW (ref 3.5–5.0)
Alkaline Phosphatase: 200 U/L — ABNORMAL HIGH (ref 38–126)
Anion gap: 13 (ref 5–15)
BUN: 17 mg/dL (ref 8–23)
CO2: 25 mmol/L (ref 22–32)
Calcium: 8.6 mg/dL — ABNORMAL LOW (ref 8.9–10.3)
Chloride: 98 mmol/L (ref 98–111)
Creatinine, Ser: 1.58 mg/dL — ABNORMAL HIGH (ref 0.44–1.00)
GFR, Estimated: 31 mL/min — ABNORMAL LOW (ref >60)
Glucose, Bld: 102 mg/dL — ABNORMAL HIGH (ref 70–99)
Potassium: 3.8 mmol/L (ref 3.5–5.1)
Sodium: 136 mmol/L (ref 135–145)
Total Bilirubin: 0.7 mg/dL (ref 0.0–1.2)
Total Protein: 5.8 g/dL — ABNORMAL LOW (ref 6.5–8.1)

## 2024-04-23 LAB — ECHOCARDIOGRAM COMPLETE
Area-P 1/2: 5.42 cm2
Calc EF: 66.7 %
Height: 66 in
S' Lateral: 1.8 cm
Single Plane A2C EF: 73.6 %
Single Plane A4C EF: 60.9 %
Weight: 1806.4 [oz_av]

## 2024-04-23 LAB — HEMOGLOBIN A1C
Hgb A1c MFr Bld: 6.1 % — ABNORMAL HIGH (ref 4.8–5.6)
Mean Plasma Glucose: 128.37 mg/dL

## 2024-04-23 LAB — PHOSPHORUS: Phosphorus: 3.8 mg/dL (ref 2.5–4.6)

## 2024-04-23 LAB — GLUCOSE, CAPILLARY
Glucose-Capillary: 101 mg/dL — ABNORMAL HIGH (ref 70–99)
Glucose-Capillary: 102 mg/dL — ABNORMAL HIGH (ref 70–99)
Glucose-Capillary: 109 mg/dL — ABNORMAL HIGH (ref 70–99)
Glucose-Capillary: 142 mg/dL — ABNORMAL HIGH (ref 70–99)
Glucose-Capillary: 90 mg/dL (ref 70–99)
Glucose-Capillary: 91 mg/dL (ref 70–99)
Glucose-Capillary: 94 mg/dL (ref 70–99)
Glucose-Capillary: 98 mg/dL (ref 70–99)

## 2024-04-23 LAB — MRSA NEXT GEN BY PCR, NASAL: MRSA by PCR Next Gen: NOT DETECTED

## 2024-04-23 LAB — MAGNESIUM: Magnesium: 2 mg/dL (ref 1.7–2.4)

## 2024-04-23 LAB — PROTIME-INR
INR: 1.3 — ABNORMAL HIGH (ref 0.8–1.2)
Prothrombin Time: 16.7 s — ABNORMAL HIGH (ref 11.4–15.2)

## 2024-04-23 MED ORDER — OXYCODONE HCL 5 MG PO TABS
5.0000 mg | ORAL_TABLET | Freq: Four times a day (QID) | ORAL | Status: DC | PRN
  Administered 2024-04-23 – 2024-04-27 (×5): 5 mg via ORAL
  Filled 2024-04-23 (×6): qty 1

## 2024-04-23 MED ORDER — STROKE: EARLY STAGES OF RECOVERY BOOK
Freq: Once | Status: AC
  Filled 2024-04-23: qty 1

## 2024-04-23 MED ORDER — IPRATROPIUM-ALBUTEROL 0.5-2.5 (3) MG/3ML IN SOLN
3.0000 mL | RESPIRATORY_TRACT | Status: DC | PRN
  Administered 2024-04-23 – 2024-04-25 (×2): 3 mL via RESPIRATORY_TRACT
  Filled 2024-04-23 (×2): qty 3

## 2024-04-23 MED ORDER — ISOSORBIDE MONONITRATE ER 60 MG PO TB24
60.0000 mg | ORAL_TABLET | Freq: Every day | ORAL | Status: DC
Start: 2024-04-23 — End: 2024-04-30
  Administered 2024-04-23 – 2024-04-30 (×8): 60 mg via ORAL
  Filled 2024-04-23 (×9): qty 1

## 2024-04-23 MED ORDER — FUROSEMIDE 10 MG/ML IJ SOLN
80.0000 mg | Freq: Once | INTRAMUSCULAR | Status: AC
  Administered 2024-04-23: 80 mg via INTRAVENOUS
  Filled 2024-04-23: qty 8

## 2024-04-23 NOTE — Consult Note (Addendum)
 NEUROLOGY CONSULT NOTE   Date of service: April 23, 2024 Patient Name: [name] MRN:  [id number] DOB:  18-Jan-1934 Chief Complaint: Acute onset right arm and leg weakness Requesting Provider: Annella Donnice SAUNDERS, MD  History of Present Illness  [name] is a 89 y.o. female with hx of CKD stage IIIb, metastatic breast cancer, CAD, GERD, ovarian cancer, hypertension, hyperlipidemia, A-fib who originally presented with acute on chronic renal failure and hyperkalemia.  She was dialyzed in the ICU overnight but appears to have been improved.  She was transferred out of the ICU this morning and was noted sometime this morning to have right wrist weakness.  Later, around noon, she was noted to have more obvious right arm weakness.  Exact last known well time is unclear.  She was noted to be in atrial flutter as well.  She does report some chest pain at times with exertion as well as difficulty breathing with exertion and baseline diarrhea after having part of her colon removed.  LKW: Unclear Modified rankin score: 1-No significant post stroke disability and can perform usual duties with stroke symptoms IV Thrombolysis: No, history of metastatic cancer and unclear last known well EVT: No, exam not consistent with LVO  NIHSS components Score: Comment  1a Level of Conscious 0[x]  1[]  2[]  3[]      1b LOC Questions 0[x]  1[]  2[]       1c LOC Commands 0[x]  1[]  2[]       2 Best Gaze 0[x]  1[]  2[]       3 Visual 0[x]  1[]  2[]  3[]      4 Facial Palsy 0[x]  1[]  2[]  3[]      5a Motor Arm - left 0[x]  1[]  2[]  3[]  4[]  UN[]    5b Motor Arm - Right 0[]  1[]  2[x]  3[]  4[]  UN[]  Later decreased to 1  6a Motor Leg - Left 0[x]  1[]  2[]  3[]  4[]  UN[]    6b Motor Leg - Right 0[]  1[x]  2[]  3[]  4[]  UN[]    7 Limb Ataxia 0[x]  1[]  2[]  UN[]      8 Sensory 0[x]  1[]  2[]  UN[]      9 Best Language 0[x]  1[]  2[]  3[]      10 Dysarthria 0[x]  1[]  2[]  UN[]      11 Extinct. and Inattention 0[x]  1[]  2[]       TOTAL:3       ROS   Comprehensive ROS performed and pertinent positives documented in HPI   Past History   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   Breast cancer (HCC) 2010   RIGHT   CAD (coronary artery disease)    Dysuria    Family history of breast cancer    GERD (gastroesophageal reflux disease)    HX OF   Heart murmur    History of blood transfusion    History of breast cancer ONCOLOGIST-- DR CLORETTA   DX 2010--  RIGHT BREAST DCIS  HIGH GRADE S/P LUMPECTOMY W/ SLN BX--  NO RECURRENCE   History of kidney stones    History of ovarian cancer    2009--  S/P COLON RESECTION FOR PAPILLARY SEROUS CARCINOMA, BORDERLINE OVARIAN CANCER VERSUS PRIMARY PERITONEAL CARCINOMA WITH PSAMMOMA BODIES--  NO RECURRENCE   History of skin cancer    excision basal cell   Hyperlipidemia    Hypertension    Kidney stones 04/2013   New onset a-fib (HCC) 11/24/2023   Nocturia    Ovarian carcinoma (HCC)  papillary serous-low grade, recurrence found in fat necrosis during ileostomy   Renal stones 2014   Shingles 30 YRS AGO   Ureteral calculi    bilateral    Past Surgical History:  Procedure Laterality Date   APPENDECTOMY     BREAST LUMPECTOMY Right 04-13-2009   W/ SLN BX   BREAST LUMPECTOMY WITH RADIOACTIVE SEED LOCALIZATION Left 06/11/2019   Procedure: LEFT BREAST LUMPECTOMY WITH RADIOACTIVE SEED LOCALIZATION;  Surgeon: Curvin Deward MOULD, MD;  Location: [facility] OR;  Service: General;  Laterality: Left;   CARDIAC CATHETERIZATION  11-11-2005 DR VICTORY SHARPS   MODERATE LAD DISEASE/ NORMAL LVF/ EF 65-75%   CATARACT EXTRACTION W/ INTRAOCULAR LENS IMPLANT Bilateral 04/26/2013   CHOLECYSTECTOMY  1990   OPEN   COLONOSCOPY     CYSTOSCOPY W/ URETERAL STENT PLACEMENT Bilateral 05/07/2013   Procedure: CYSTOSCOPY WITH STENT REPLACEMENTS;  Surgeon: Ricardo Likens, MD;  Location: [facility];  Service: Urology;  Laterality: Bilateral;    CYSTOSCOPY WITH BIOPSY N/A 04/07/2013   Procedure: CYSTOSCOPY WITH BIOPSY and fulgeration;  Surgeon: Ricardo Likens, MD;  Location: [facility];  Service: Urology;  Laterality: N/A;   CYSTOSCOPY WITH RETROGRADE PYELOGRAM, URETEROSCOPY AND STENT PLACEMENT Bilateral 05/07/2013   Procedure: CYSTOSCOPY WITH RETROGRADE PYELOGRAM, URETEROSCOPY ;  Surgeon: Ricardo Likens, MD;  Location: [facility];  Service: Urology;  Laterality: Bilateral;   CYSTOSCOPY WITH RETROGRADE PYELOGRAM, URETEROSCOPY AND STENT PLACEMENT Bilateral 01/06/2019   Procedure: CYSTOSCOPY WITH RETROGRADE PYELOGRAM, URETEROSCOPY AND STENT PLACEMENT, FIRST STAGE;  Surgeon: Likens Ricardo, MD;  Location: [facility];  Service: Urology;  Laterality: Bilateral;   CYSTOSCOPY WITH RETROGRADE PYELOGRAM, URETEROSCOPY AND STENT PLACEMENT Bilateral 01/27/2019   Procedure: CYSTOSCOPY WITH RETROGRADE PYELOGRAM, URETEROSCOPY AND STENT PLACEMENT;  Surgeon: Likens Ricardo, MD;  Location: [facility];  Service: Urology;  Laterality: Bilateral;  75 MINS   CYSTOSCOPY WITH STENT PLACEMENT Bilateral 04/07/2013   Procedure: CYSTOSCOPY WITH STENT PLACEMENT;  Surgeon: Ricardo Likens, MD;  Location: [facility];  Service: Urology;  Laterality: Bilateral;   DX LAPAROSCOPY W/ PERITONEAL AND OMENTAL BX'S AND WASHINGS  02-16-2008   EXCISION RIGHT BREAST MASS  02-10-2002   EXP. LAP. EXTENSIVE ADHESIOLYSIS/ RESECTION TERMINAL ILEUM , ASCENDING AND DESCENDING COLON WITH CREATION ILEOSTOMY AND MUCOUS FISTULA  02-19-2008   PERFERATION AND ABD. CANCER--  TAKEDOWN ILEOSTOMY 08-23-2008   HOLMIUM LASER APPLICATION Bilateral 01/06/2019   Procedure: HOLMIUM LASER APPLICATION;  Surgeon: Likens Ricardo, MD;  Location: [facility];  Service: Urology;  Laterality: Bilateral;   HOLMIUM LASER APPLICATION Bilateral 01/27/2019   Procedure: HOLMIUM LASER APPLICATION;  Surgeon: Likens Ricardo, MD;  Location: [facility];  Service: Urology;  Laterality: Bilateral;   I & D EXTREMITY Right 02/02/2022    Procedure: IRRIGATION AND DEBRIDEMENT HAND;  Surgeon: Shari Easter, MD;  Location: [facility] OR;  Service: Orthopedics;  Laterality: Right;   ILEOSTOMY CLOSURE  08/2008   TOTAL ABDOMINAL HYSTERECTOMY W/ BILATERAL SALPINGOOPHORECTOMY  1976   TOTAL ABDOMINAL HYSTERECTOMY W/ BILATERAL SALPINGOOPHORECTOMY  1976   VULVAR LESION REMOVAL N/A 08/22/2014   Procedure: EXCISION OF VULVAR CYST  ;  Surgeon: Ronal Elvie Pinal, MD;  Location: [facility];  Service: Gynecology;  Laterality: N/A;   WOUND DEBRIDEMENT  05/28/2012   Procedure: DEBRIDEMENT ABDOMINAL WOUND;  Surgeon: Sherlean JINNY Laughter, MD;  Location: [facility];  Service: General;  Laterality: N/A;  excision chronic wound abdominal wall    Family History: Family History  Problem Relation Age of Onset   Heart disease Mother  Hyperlipidemia Mother    Breast cancer Sister    Cancer Sister        breast    Social History  reports that she has never smoked. She has never used smokeless tobacco. She reports that she does not drink alcohol and does not use drugs.  Allergies  Allergen Reactions   Ramipril Itching    Medications   Current Facility-Administered Medications:    [START ON 04/24/2024]  stroke: early stages of recovery book, , Does not apply, Once, [employer], Cortney E, NP   alteplase  (CATHFLO ACTIVASE ) injection 2 mg, 2 mg, Intracatheter, Once PRN, Dennise Hoes, MD   anticoagulant sodium citrate  solution 5 mL, 5 mL, Intracatheter, PRN, Dennise Hoes, MD   Chlorhexidine  Gluconate Cloth 2 % PADS 6 each, 6 each, Topical, Q0600, Hunsucker, Donnice SAUNDERS, MD, 6 each at 04/23/24 0930   docusate sodium  (COLACE) capsule 100 mg, 100 mg, Oral, BID PRN, Hunsucker, Donnice SAUNDERS, MD   heparin  injection 1,000 Units, 1,000 Units, Intracatheter, PRN, Dennise Hoes, MD   heparin  injection 5,000 Units, 5,000 Units, Subcutaneous, Q8H, Paliwal, Aditya, MD, 5,000 Units at 04/23/24 0731   insulin  aspart (novoLOG ) injection 0-9 Units, 0-9 Units,  Subcutaneous, Q4H, Hunsucker, Donnice SAUNDERS, MD   ipratropium-albuterol  (DUONEB) 0.5-2.5 (3) MG/3ML nebulizer solution 3 mL, 3 mL, Nebulization, Q4H PRN, Paliwal, Aditya, MD, 3 mL at 04/23/24 0455   isosorbide  mononitrate (IMDUR ) 24 hr tablet 60 mg, 60 mg, Oral, Daily, Hunsucker, Donnice SAUNDERS, MD, 60 mg at 04/23/24 1148   lidocaine  (PF) (XYLOCAINE ) 1 % injection 5 mL, 5 mL, Intradermal, PRN, Dennise Hoes, MD   lidocaine -prilocaine  (EMLA ) cream 1 Application, 1 Application, Topical, PRN, Dennise Hoes, MD   ondansetron  (ZOFRAN ) injection 4 mg, 4 mg, Intravenous, Q6H PRN, Hunsucker, Donnice SAUNDERS, MD   pentafluoroprop-tetrafluoroeth (GEBAUERS) aerosol 1 Application, 1 Application, Topical, PRN, Singh, Vikas, MD   polyethylene glycol (MIRALAX  / GLYCOLAX ) packet 17 g, 17 g, Oral, Daily PRN, Hunsucker, Donnice SAUNDERS, MD  Vitals   Vitals:   04/23/24 1100 04/23/24 1105 04/23/24 1304 04/23/24 1305  BP: (!) 181/54  (!) 167/57 (!) 167/57  Pulse: 85  74 72  Resp: (!) 23  16 19   Temp:  97.8 F (36.6 C)    TempSrc:  Oral    SpO2: 95%  92% 92%    There is no height or weight on file to calculate BMI.   Physical Exam   Constitutional: Chronically ill-appearing elderly patient in no acute distress Psych: Affect appropriate to situation.  Eyes: No scleral injection.  HENT: No OP obstruction.  Head: Normocephalic.  Cardiovascular: Atrial flutter on the monitor Respiratory: Effort normal, non-labored breathing on supplemental O2 Skin: WDI.   Neurologic Examination    NEURO:  Mental Status: AA&Ox3, able to give clear and coherent history of present illness Speech/Language: speech is without dysarthria or aphasia.  Naming, repetition, fluency, and comprehension intact.  Cranial Nerves:  II: PERRL. Visual fields full.  III, IV, VI: EOMI. Eyelids elevate symmetrically.  V: Sensation is intact to light touch and symmetrical to face.  VII: Smile is symmetrical.  VIII: hearing intact to voice. IX, X: Phonation  is normal.  KP:Dynloizm shrug 5/5. XII: tongue is midline without fasciculations. Motor: Able to move left upper and lower extremities with good antigravity strength, at baseline, but notable weakness proximally and distally in right arm with drift, some drift noted as well in right leg Tone: is normal and bulk is normal Sensation- Intact to light touch  bilaterally. Extinction absent to light touch to DSS.   Coordination: FTN intact on the right, unable to perform on the left Gait- deferred    Labs/Imaging/Neurodiagnostic studies   CBC:  Recent Labs  Lab 05/07/2024 1041 05-07-24 1041 05/07/2024 1612 05/07/2024 1624 2024/05/07 2046 04/23/24 0416  WBC 11.3*   < > 12.6*  --  16.1* 12.1*  NEUTROABS 9.3*  --  9.6*  --   --   --   HGB 9.2*  --  9.0*   < > 8.8* 9.1*  HCT 29.5*  --  29.8*   < > 28.7* 27.8*  MCV 93.7  --  96.8  --  96.0 89.1  PLT 228   < > 259  --  239 209   < > = values in this interval not displayed.   Basic Metabolic Panel:  Lab Results  Component Value Date   NA 136 04/23/2024   K 3.8 04/23/2024   CO2 25 04/23/2024   GLUCOSE 102 (H) 04/23/2024   BUN 17 04/23/2024   CREATININE 1.58 (H) 04/23/2024   CALCIUM  8.6 (L) 04/23/2024   GFRNONAA 31 (L) 04/23/2024   GFRAA 44 (L) 06/07/2019   Lipid Panel: No results found for: LDLCALC HgbA1c:  Lab Results  Component Value Date   HGBA1C 6.1 (H) 04/23/2024   Urine Drug Screen: No results found for: LABOPIA, COCAINSCRNUR, LABBENZ, AMPHETMU, THCU, LABBARB  Alcohol Level No results found for: [facility] INR  Lab Results  Component Value Date   INR 1.3 (H) 04/23/2024   APTT  Lab Results  Component Value Date   APTT  02/22/2008    37        IF BASELINE aPTT IS ELEVATED, SUGGEST PATIENT RISK ASSESSMENT BE USED TO DETERMINE APPROPRIATE ANTICOAGULANT THERAPY.    CT Head without contrast(Personally reviewed): No acute abnormality, atrophy and chronic small vessel ischemic disease  MR Angio head without  contrast and Carotid Duplex BL(Personally reviewed): Pending  MRI Brain(Personally reviewed): Pending  ASSESSMENT   [name] is a 89 y.o. female with hx of CKD stage IIIb, metastatic breast cancer, CAD, GERD, ovarian cancer, hypertension, hyperlipidemia, A-fib who originally presented with acute on chronic renal failure and hyperkalemia.  She was dialyzed in the ICU last night, improved greatly and was transferred out of the ICU this morning.  Sometime this morning, she was noted to have right wrist weakness, which progressed to notable weakness of the entire right arm and right leg.  As last known well was unclear and patient has a history of metastatic cancer, she was not a TNK candidate.  Head CT revealed no acute abnormalities, will obtain MRI brain and MRA of the head with carotid ultrasound for further investigation.  Unfortunately, cannot obtain MRI brain with contrast due to creatinine clearance of 20.3.    RECOMMENDATIONS  - Permissive HTN x48 hrs from sx onset or until stroke ruled out by MRI goal BP <220/120. PRN labetalol or hydralazine  if BP above these parameters. Avoid oral antihypertensives. - MRI brain wo contrast - MRA head with carotid ultrasound - TTE  - Check A1c and LDL + add statin per guidelines - antiplt/anticoag to be determined after MRI - q4 hr neuro checks - STAT head CT for any change in neuro exam - Tele - PT/OT/SLP - Stroke education - Amb referral to neurology upon discharge   ______________________________________________________________________  Patient seen by NP with MD, MD to edit note as needed.  Signed, Cortney E Everitt Clint Kill, NP  [facility]    Attending Neurohospitalist Addendum Patient seen and examined with APP/Resident. Agree with the history and physical as documented above. Agree with the plan as documented, which I helped formulate. I have edited the note above to reflect my full findings and recommendations. I have  independently reviewed the chart, obtained history, review of systems and examined the patient.I have personally reviewed pertinent head/neck/spine imaging (CT/MRI). Please feel free to call with any questions.  Patient was noted to be in atrial flutter upon return to her room from CT scanner. This is a known dx and per chart patient was not previously started on anticoagulation due to account of frailty, fall risk, chronic anemia, metastatic cancer. This should potentially be reconsidered. I am not sure why metastatic cancer would be a contraindication to anticoagulation for a flutter, especially since underlying malignancy makes patients more hypercoagulable not less. Stroke team will provide further guidance on this after MRI has been completed and they are able to discuss with patient/family.  -- Elida Ross, MD [facility] [phone]  If 7pm- 7am, please page neurology on call as listed in AMION.

## 2024-04-23 NOTE — Significant Event (Addendum)
 Earlier this morning patient complained of right wrist weakness.  A bit weaker, 4+ out of 5 compared to 5 out of 5 on the left.  I have arrestive right upper extremity with 5/5 in strength at the elbow and shoulder.  Right AC IV was removed.  This was around 10:30 AM.  Subsequently transferred to the floor given no further ICU needs which prompted admission.  I was notified upon transfer to the floor the right arm was weaker.  On my assessment it is much weaker, no longer able to maintain against gravity with shoulder extension.  Right lower extremity also weaker 4 out of 5 compared to the left 5 out of 5 with hip flexion.  Discussed with bedside RN and charge nurse, code stroke activated.  Patient was alerted that a large medical team would arrive and provide further assessment.

## 2024-04-23 NOTE — TOC Initial Note (Signed)
 Transition of Care [facility]) - Initial/Assessment Note    Patient Details  Name: [name] MRN: [id number] Date of Birth: Nov 20, 1933  [facility]) CM/SW Contact:    Lauraine FORBES Saa, LCSW Phone Number: 04/23/2024, 11:47 AM  Clinical Narrative:                  11:47 AM CSW introduced self and role to patient. Patient's daughter, Alison, was also present. Patient consented CSW to speak to and in front of [place]. Patient confirmed she resides at home alone but has family and friends that could provide transportation upon discharge. Patient has SNF history with [facility] and ALF history with [facility]. Patient confirmed HH history (per chart review, with [facility]). Patient confirmed DME (wheelchair, walker, chair lift, cane, shower chair, ramps, BSC) history. Per chart review, patient has a PCP and insurance. Patient's preferred pharmacy's are [facility] [place], [place] [address] [place], and [facility].  Expected Discharge Plan: Home/Self Care Barriers to Discharge: Continued Medical Work up   Patient Goals and CMS Choice            Expected Discharge Plan and Services       Living arrangements for the past 2 months: Single Family Home                                      Prior Living Arrangements/Services Living arrangements for the past 2 months: Single Family Home Lives with:: Self Patient language and need for interpreter reviewed:: Yes Do you feel safe going back to the place where you live?: Yes      Need for Family Participation in Patient Care: No (Comment)     Criminal Activity/Legal Involvement Pertinent to Current Situation/Hospitalization: No - Comment as needed  Activities of Daily Living      Permission Sought/Granted Permission sought to share information with : Family Supports Permission granted to share information with : Yes, Verbal Permission Granted  Share Information with  NAME: [name]     Permission granted to share info w Relationship: Daughter  Permission granted to share info w Contact Information: [phone]  Emotional Assessment Appearance:: Appears stated age Attitude/Demeanor/Rapport: Engaged Affect (typically observed): Accepting, Adaptable, Calm, Appropriate, Stable, Pleasant Orientation: : Oriented to Self, Oriented to Place, Oriented to  Time, Oriented to Situation Alcohol / Substance Use: Not Applicable Psych Involvement: No (comment)  Admission diagnosis:  Hyperkalemia [E87.5] Acute renal failure (HCC) [N17.9] Acute renal failure, unspecified acute renal failure type (HCC) [N17.9] Patient Active Problem List   Diagnosis Date Noted   Acute renal failure (HCC) 04/22/2024   Atrial fibrillation with RVR (HCC) 04/09/2024   Acute on chronic diastolic CHF (congestive heart failure) (HCC) 04/09/2024   CAP (community acquired pneumonia) 04/09/2024   Elevated troponin 01/02/2024   Electrolyte abnormality 12/27/2023   Persistent atrial fibrillation (HCC) 12/27/2023   Diastolic CHF, chronic (HCC) 12/25/2023   Chronic kidney disease, stage 3b (HCC) 12/23/2023   Thrombocytopenia (HCC) 12/06/2023   Medication management 12/03/2023   Counseling and coordination of care 12/02/2023   Odynophagia 12/02/2023   Palliative care encounter 12/02/2023   Protein-calorie malnutrition, severe 12/02/2023   Failure to thrive 11/30/2023   Dysphagia 11/30/2023   Bicytopenia 11/27/2023   Hypokalemia 11/27/2023   Hypertrophic cardiomyopathy (HCC) 11/24/2023   Demand ischemia (HCC) 11/24/2023   Chronic anemia 11/24/2023   Malignant neoplasm of ovary (HCC) 11/24/2023  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) 11/12/2023   Infection of hand 02/02/2022   Genetic testing 07/20/2019   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/20/2019   Family history of breast cancer    Carcinoma of upper-outer quadrant of left breast in female, estrogen  receptor positive (HCC) 05/19/2019   Left carotid bruit 01/21/2017   Hypertension    Hyperlipidemia    GERD (gastroesophageal reflux disease)    Ureteral calculi    History of breast cancer    History of ovarian cancer    History of skin cancer    Frequency of urination    Dysuria    Kidney stones    CAD (coronary artery disease)    Ductal carcinoma (HCC) 08/11/2013   Serous tumor, 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   PCP:  Elliot Charm, MD Pharmacy:   [facility] - [place], [place] - [address] [place] [address] Phone: [phone] Fax: [phone]  [id number] [place], [place] - [address] AT [facility] [address] [place] [address] Phone: [phone] Fax: [phone]  [facility] - [place], [place] - [address] [place] [address] Phone: [phone] Fax: [phone]     Social Drivers of Health (SDOH) Social History: SDOH Screenings   Food Insecurity: No Food Insecurity (04/09/2024)  Housing: Low Risk  (04/09/2024)  Transportation Needs: No Transportation Needs (04/09/2024)  Utilities: Not At Risk (04/09/2024)  Depression (PHQ2-9): Low Risk  (04/22/2024)  Social Connections: Moderately Isolated (04/09/2024)  Tobacco Use: Low Risk  (04/22/2024)   SDOH Interventions:     Readmission Risk Interventions    04/12/2024   11:59 AM 12/09/2023   12:21 PM 11/23/2023   11:56 AM  Readmission Risk Prevention Plan  Transportation Screening Complete Complete Complete  PCP or Specialist Appt within 5-7 Days   Complete  Home Care Screening   Complete  Medication Review (RN CM)   Complete  Medication Review (RN Care Manager) Referral to Pharmacy    HRI or Home Care Consult Complete    SW Recovery Care/Counseling Consult Complete    Palliative Care Screening Not Applicable    Skilled Nursing Facility Not Applicable Complete

## 2024-04-23 NOTE — TOC Progression Note (Signed)
 Transition of Care [facility]) - Progression Note    Patient Details  Name: [name] MRN: [id number] Date of Birth: Feb 22, 1934  [facility]) CM/SW Contact  Corean JAYSON Canary, RN Phone Number: 04/23/2024, 3:48 PM  Clinical Narrative:     Patient presented to [address] and she had unilateral weakness. Code stroke was called. Brandi from [facility] called to state the patient was active with them for RN, PT and OT  Resumption orders in. TOC will continue to follow  Expected Discharge Plan: Home w Home Health Services Barriers to Discharge: Continued Medical Work up  Expected Discharge Plan and Services       Living arrangements for the past 2 months: Single Family Home                                       Social Determinants of Health (SDOH) Interventions SDOH Screenings   Food Insecurity: No Food Insecurity (04/09/2024)  Housing: Low Risk  (04/09/2024)  Transportation Needs: No Transportation Needs (04/09/2024)  Utilities: Not At Risk (04/09/2024)  Depression (PHQ2-9): Low Risk  (04/22/2024)  Social Connections: Moderately Isolated (04/09/2024)  Tobacco Use: Low Risk  (04/22/2024)    Readmission Risk Interventions    04/12/2024   11:59 AM 12/09/2023   12:21 PM 11/23/2023   11:56 AM  Readmission Risk Prevention Plan  Transportation Screening Complete Complete Complete  PCP or Specialist Appt within 5-7 Days   Complete  Home Care Screening   Complete  Medication Review (RN CM)   Complete  Medication Review (RN Care Manager) Referral to Pharmacy    HRI or Home Care Consult Complete    SW Recovery Care/Counseling Consult Complete    Palliative Care Screening Not Applicable    Skilled Nursing Facility Not Applicable Complete

## 2024-04-23 NOTE — Progress Notes (Signed)
 Pt transferred to 5W on arrival on the unit. Pt's R arm weakness noticeably worse from previous assessment. Pt used L hand to move R arm. Spoke with bedside nurse [facility], informed her I would reach out to CCM doc. Notified Dr. Annella.

## 2024-04-23 NOTE — Progress Notes (Signed)
 Pt c/o of weakness in R arm. Informed Dr. Annella, R arm was assessed by Dr. Annella. No new orders given at this time.

## 2024-04-23 NOTE — Plan of Care (Signed)

## 2024-04-23 NOTE — Progress Notes (Signed)
 [facility] NEPHROLOGY PROGRESS NOTE  Assessment/ Plan: Pt is a 89 y.o. yo female with past medical history significant for metastatic breast and ovarian cancer, chronic diastolic CHF, CKD followed by me, CAD, anemia who was sent to the ER from nephrology clinic for shortness of breath, bradycardia/hypotension in the setting of hyperkalemia and CHF.  # Hyperkalemia severe with bradycardia: Discontinued potassium chloride , status post urgent dialysis overnight.  Potassium level improved.  Monitor lab.  # Acute kidney injury on CKD 3/4: Recent worsening creatinine level in the setting of CHF exacerbation/fluid overload.  Required dialysis overnight.  Will go order a dose of Lasix  today, monitor urine output.  Daily assessment for dialysis need.  # Acute on chronic CHF with preserved EF: Pleural effusion and fluid overload on exam.  Getting echo.  Treating with IV diuretics.  # Anemia: Hemoglobin improving.  Monitor lab data transfuse as needed.  # Metastatic breast and ovarian cancer.  Patient is DNR.  Following with oncologist.  I spoke with patient's daughter and updated..  Discussed with ICU team as well.  Subjective: Seen and examined in ICU.  Patient looks more alert awake compared to when I saw her in the clinic yesterday.  Tolerated dialysis well with improvement of potassium level.  BP improved.  Discussed with ICU providers and nurse. Objective Vital signs in last 24 hours: Vitals:   04/23/24 0700 04/23/24 0800 04/23/24 0900 04/23/24 1000  BP: (!) 151/77 (!) 165/59 (!) 163/61 (!) 172/65  Pulse: 96 85 85 85  Resp: 20 16 19 18   Temp: 98.2 F (36.8 C) 98.1 F (36.7 C) 98.1 F (36.7 C)   TempSrc:      SpO2: 92% 94% 96% 94%   Weight change:   Intake/Output Summary (Last 24 hours) at 04/23/2024 1053 Last data filed at 04/23/2024 1016 Gross per 24 hour  Intake 2573.95 ml  Output 800 ml  Net 1773.95 ml       Labs: RENAL PANEL Recent Labs  Lab  04/22/24 1041 04/22/24 1131 04/22/24 1612 04/22/24 1624 04/22/24 1708 04/22/24 2046 04/23/24 0416  NA 137 138 135 135 136 138 136  K 6.5* 6.5* 7.4* 7.4* 6.6* 6.5* 3.8  CL 108 109 110 112* 113* 112* 98  CO2 15* 17* 12*  --   --  14* 25  GLUCOSE 131* 128* 259* 264* 291* 160* 102*  BUN 45* 44* 46* 43* 43* 46* 17  CREATININE 2.98* 3.04* 3.09* 3.30* 3.10* 3.02* 1.58*  CALCIUM  8.4* 8.7* 8.0*  --   --  8.8* 8.6*  MG 1.1*  --  1.1*  --   --   --  2.0  PHOS  --   --   --   --   --  6.0* 3.8  ALBUMIN  3.5  --  2.5*  --   --  2.5* 2.7*    Liver Function Tests: Recent Labs  Lab 04/22/24 1041 04/22/24 1612 04/22/24 2046 04/23/24 0416  AST 25 49*  --  116*  ALT 36 39  --  104*  ALKPHOS 137* 118  --  200*  BILITOT 0.4 0.6  --  0.7  PROT 6.2* 5.5*  --  5.8*  ALBUMIN  3.5 2.5* 2.5* 2.7*   No results for input(s): LIPASE, AMYLASE in the last 168 hours. Recent Labs  Lab 04/22/24 1947  AMMONIA 24   CBC: Recent Labs    12/24/23 0231 12/25/23 0312 04/22/24 1612 04/22/24 1624 04/22/24 1708 04/22/24 2046 04/23/24 0416  HGB 7.4*   < >  9.0* 9.2* 7.8* 8.8* 9.1*  MCV 104.0*   < > 96.8  --   --  96.0 89.1  VITAMINB12 1,125*  --   --   --   --   --   --   FOLATE 28.0  --   --   --   --   --   --   FERRITIN 323*  --   --   --   --   --   --   TIBC 225*  --   --   --   --   --   --   IRON 66  --   --   --   --   --   --    < > = values in this interval not displayed.    Cardiac Enzymes: No results for input(s): CKTOTAL, CKMB, CKMBINDEX, TROPONINI in the last 168 hours. CBG: Recent Labs  Lab 04/22/24 1657 04/22/24 1819 04/23/24 0106 04/23/24 0319 04/23/24 0737  GLUCAP 265* 203* 109* 101* 98    Iron Studies: No results for input(s): IRON, TIBC, TRANSFERRIN, FERRITIN in the last 72 hours. Studies/Results: CT CHEST WO CONTRAST Result Date: 04/22/2024 CLINICAL DATA:  History of lung carcinoma with metastatic disease, shortness of breath EXAM: CT CHEST WITHOUT  CONTRAST TECHNIQUE: Multidetector CT imaging of the chest was performed following the standard protocol without IV contrast. RADIATION DOSE REDUCTION: This exam was performed according to the departmental dose-optimization program which includes automated exposure control, adjustment of the mA and/or kV according to patient size and/or use of iterative reconstruction technique. COMPARISON:  04/09/2024 FINDINGS: Cardiovascular: Heart is stable in size. Heavy coronary calcifications are noted. Atherosclerotic calcifications of the aorta are noted without aneurysmal dilatation. Mediastinum/Nodes: Thoracic inlet shows evidence of a left jugular dialysis catheter the esophagus as visualized is within normal limits. No hilar or mediastinal adenopathy is noted. Lungs/Pleura: Lungs again demonstrate bilateral lower lobe infiltrates with associated effusions right greater than left. These changes have progressed in the interval from the prior exam. Scattered small nodules are again seen to include a 3 mm nodule in the left lower lobe on image number 81 of series 4. New patchy somewhat nodular density is noted in the left lower lobe on image number 58 of series 4 measuring up to 10 mm in average diameter. Given its abrupt onset and adjacent progressive infiltrate, this is felt to be postinflammatory in nature. Previously seen right lower lobe nodule is now enveloped within the increasing basilar infiltrate. No other sizable nodules are seen. Upper Abdomen: As described on concomitant CT of the abdomen and pelvis. Musculoskeletal: Old healed rib fractures are seen. Heel metastatic changes are noted involving the vertebral bodies from T8-T12. T11 vertebral plana with increased kyphosis is seen. Previously seen nodule in the region of the left breast is again identified and stable. Again possibility of neoplastic lesion deserves consideration. IMPRESSION: Progressive bilateral lower lobe infiltrate with associated effusions.  Stable parenchymal nodules when compared with the prior exam. Healed metastatic disease Aortic Atherosclerosis (ICD10-I70.0) and Emphysema (ICD10-J43.9). Electronically Signed   By: Oneil Devonshire M.D.   On: 04/22/2024 19:29   CT RENAL STONE STUDY Result Date: 04/22/2024 CLINICAL DATA:  Flank pain EXAM: CT ABDOMEN AND PELVIS WITHOUT CONTRAST TECHNIQUE: Multidetector CT imaging of the abdomen and pelvis was performed following the standard protocol without IV contrast. RADIATION DOSE REDUCTION: This exam was performed according to the departmental dose-optimization program which includes automated exposure control, adjustment of the mA and/or  kV according to patient size and/or use of iterative reconstruction technique. COMPARISON:  03/25/2024 FINDINGS: Lower chest: Bilateral lower lobe consolidation and effusions are seen. This is worse on the right than the left. Hepatobiliary: Known hepatic lesions are not well visualized on this noncontrast study. Status post cholecystectomy. No biliary dilatation. Pancreas: Unremarkable. No pancreatic ductal dilatation or surrounding inflammatory changes. Spleen: Normal in size without focal abnormality. Adrenals/Urinary Tract: Left adrenal gland is within normal limits. Right adrenal nodule is again noted and stable. No follow-up is recommended. Kidneys are well visualized bilaterally. Bilateral nonobstructing renal calculi are noted. The largest of these on the left measures 8 mm. Largest on the right measures approximately 3 mm. The ureters are within normal limits. The bladder is decompressed. Stomach/Bowel: Fecal material is noted scattered throughout the colon. No obstructive changes are seen. The appendix has been surgically removed. Small bowel and stomach are within normal limits. No obstructive changes are seen. Vascular/Lymphatic: Aortic atherosclerosis. No enlarged abdominal or pelvic lymph nodes. Reproductive: Status post hysterectomy. No adnexal masses. Other: No  abdominal wall hernia or abnormality. No abdominopelvic ascites. Musculoskeletal: Vertebral plana is noted at T11. Increased sclerosis is noted in T10 and T12 consistent with prior metastatic disease. Anterolisthesis of L5 on S1 is noted. Metastatic disease in the L4 spinous process is noted as well. IMPRESSION: Bilateral lower lobe consolidation and effusions right greater than left. Bilateral nonobstructing renal calculi as described. Scattered fecal material without obstructive change. Healed metastatic bony lesions Known hepatic metastatic disease is not well appreciated on this noncontrast study. Electronically Signed   By: Oneil Devonshire M.D.   On: 04/22/2024 19:12    Medications: Infusions:  anticoagulant sodium citrate       Scheduled Medications:  Chlorhexidine  Gluconate Cloth  6 each Topical Q0600   heparin  injection (subcutaneous)  5,000 Units Subcutaneous Q8H   insulin  aspart  0-9 Units Subcutaneous Q4H   isosorbide  mononitrate  60 mg Oral Daily    have reviewed scheduled and prn medications.  Physical Exam: General:NAD, comfortable Heart:RRR, s1s2 nl Lungs:clear b/l, no crackle Abdomen:soft, Non-tender, non-distended Extremities: Bilateral leg pitting edema++ Neurology: Alert, awake and following commands  Jacklin Zwick Prasad Jerick Khachatryan 04/23/2024,10:53 AM  LOS: 1 day

## 2024-04-23 NOTE — Progress Notes (Signed)
 NAME:  [name], MRN:  [id number], DOB:  [birth year], LOS: 1 ADMISSION DATE:  04/22/2024, CONSULTATION DATE:  04/23/24 REFERRING MD:  ED, CHIEF COMPLAINT:  abnormal labs   History of Present Illness:  89 year old presents from oncology clinic with abnormal labs, hyperkalemia and acute renal failure.  She presents to outpatient appointment today.  Otherwise doing okay.  A bit weaker.  A bit altered per daughter report.  Labs resulted with hyperkalemia and acute renal failure.  She was noted be bradycardic.  Sent to the ED.  Bradycardia persisted.  Atropine  was given and rapid improvement and went back down again.  With efforts to shift potassium heart rate improved from the 30s to the 50s 60s.  Repeat potassium remains elevated.  No workup for renal failure was done.  Nephrology was called.  Recommended dialysis.  ICU admission given severe symptomatic hyperkalemia.  Daughter endorses patient's had normal p.o. intake.  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  But diarrhea has been no worse.  Actually a bit better last few days.  She notes that she was quite altered, unable to really communicate.  Currently at time of evaluation she patient was able to communicate with myself and daughter.  This is improved compared to initial presentation in the ED.   Pertinent  Medical History  Metastatic cancer CKD 3B  Significant Hospital Events: Including procedures, antibiotic start and stop dates in addition to other pertinent events   7/17 HyperK with bradycardia, got HD  Interim History / Subjective:  Got HD, K better, HR better, BP elevated restarting some home BP meds  Objective    Blood pressure (!) 147/96, pulse 88, temperature 98.2 F (36.8 C), resp. rate 20, last menstrual period 10/07/1974, SpO2 94%.        Intake/Output Summary (Last 24 hours) at 04/23/2024 0851 Last data filed at 04/23/2024 0600 Gross per 24 hour  Intake 2259.5 ml  Output 600 ml  Net  1659.5 ml   There were no vitals filed for this visit.  Examination: General: Chronically ill-appearing lying in bed HENT: Prominent EJ bilaterally very distended, moist mucous membranes Lungs: Diminished, normal work of breathing on room air Cardiovascular: RRR Abdomen: Nondistended, diffusely mildly tender no rebound or guarding Extremities: No edema Neuro: Arouses, interacts and communicates appropriately   Resolved problem list   Assessment and Plan   Acute renal failure on ckd3b: Unclear etiology.  Oral intake seems at baseline.  She does appear to be a bit volume overloaded with prominent EJ, do wonder about gradual accumulation of fluid, venous congestion, develop with cardiorenal syndrome.  She has bilateral pleural effusions worse on recent imaging etc. -- s/p HD, nephrology following -- CT abdomen pelvis w/o hydronephrosis  Hyperkalemia: Presumably related to renal failure and acidemia etc. -- Status post shifting in the ED with IV insulin  and dextrose , Lasix  also administered -- Stop bicarbonate drip -- Dialysis as above  HTN: --resume home imdur    Nongap metabolic acidosis: Related to renal failure as well as developing hyperchloremia. Resolved.  Bradycardia: Presume related to acid-base disturbance, metabolic disturbance hyperkalemia with renal failure, resolved  Bilateral pleural effusions: Present 2 weeks prior, likely worsened in the setting of oliguria and anuria renal failure.  Recent imaging June showed good response to cancer therapy, lower suspicion for malignancy. -- s/p dialysis, IV lasix  recommended -- consider palliative thoracentesis via IR if not improving in coming days   Hyperglycemia: -- SSI  Anemia: Presumed of chronic disease,  chronically low although a bit lower on admission.  Most recent iron panel 12/2023 consistent with anemia of chronic disease. -- Transfusion goal hemoglobin 7  Metastatic cancer: Breast in origin.  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  Transfer out of ICU, PCCM will sign off  Best Practice (right click and Reselect all SmartList Selections daily)   Diet/type: NPO DVT prophylaxis prophylactic heparin   Pressure ulcer(s): pressure ulcer assessment deferred  GI prophylaxis: N/A Lines: Dialysis Catheter and yes and it is still needed Foley:  N/A Code Status:  DNR Last date of multidisciplinary goals of care discussion [Per discussion with ED physician patient DNR, multiple DNR orders in the past, DNR re ordered on admission]  Labs   CBC: Recent Labs  Lab 04/22/24 1041 04/22/24 1612 04/22/24 1624 04/22/24 1708 04/22/24 2046 04/23/24 0416  WBC 11.3* 12.6*  --   --  16.1* 12.1*  NEUTROABS 9.3* 9.6*  --   --   --   --   HGB 9.2* 9.0* 9.2* 7.8* 8.8* 9.1*  HCT 29.5* 29.8* 27.0* 23.0* 28.7* 27.8*  MCV 93.7 96.8  --   --  96.0 89.1  PLT 228 259  --   --  239 209    Basic Metabolic Panel: Recent Labs  Lab 04/22/24 1041 04/22/24 1131 04/22/24 1612 04/22/24 1624 04/22/24 1708 04/22/24 2046 04/23/24 0416  NA 137 138 135 135 136 138 136  K 6.5* 6.5* 7.4* 7.4* 6.6* 6.5* 3.8  CL 108 109 110 112* 113* 112* 98  CO2 15* 17* 12*  --   --  14* 25  GLUCOSE 131* 128* 259* 264* 291* 160* 102*  BUN 45* 44* 46* 43* 43* 46* 17  CREATININE 2.98* 3.04* 3.09* 3.30* 3.10* 3.02* 1.58*  CALCIUM  8.4* 8.7* 8.0*  --   --  8.8* 8.6*  MG 1.1*  --  1.1*  --   --   --  2.0  PHOS  --   --   --   --   --  6.0* 3.8   GFR: Estimated Creatinine Clearance: 20.3 mL/min (A) (by C-G formula based on SCr of 1.58 mg/dL (H)). Recent Labs  Lab 04/22/24 1041 04/22/24 1612 04/22/24 2046 04/23/24 0416  WBC 11.3* 12.6* 16.1* 12.1*    Liver Function Tests: Recent Labs  Lab 04/22/24 1041 04/22/24 1612 04/22/24 2046 04/23/24 0416  AST 25 49*  --  116*  ALT 36 39  --  104*  ALKPHOS 137* 118  --  200*  BILITOT 0.4 0.6  --  0.7  PROT  6.2* 5.5*  --  5.8*  ALBUMIN  3.5 2.5* 2.5* 2.7*   No results for input(s): LIPASE, AMYLASE in the last 168 hours. Recent Labs  Lab 04/22/24 1947  AMMONIA 24    ABG    Component Value Date/Time   PHART 7.445 (H) 02/22/2008 0430   PCO2ART 35.2 02/22/2008 0430   PO2ART 68.4 (L) 02/22/2008 0430   HCO3 23.9 02/22/2008 0430   TCO2 13 (L) 04/22/2024 1708   ACIDBASEDEF 2.9 (H) 02/21/2008 0430   O2SAT 95.1 02/22/2008 0430     Coagulation Profile: Recent Labs  Lab 04/23/24 0416  INR 1.3*    Cardiac Enzymes: No results for input(s): CKTOTAL, CKMB, CKMBINDEX, TROPONINI in the last 168 hours.  HbA1C: Hgb A1c MFr Bld  Date/Time Value Ref Range Status  04/23/2024 04:16 AM 6.1 (H) 4.8 - 5.6 % Final    Comment:    (  NOTE) Diagnosis of Diabetes The following HbA1c ranges recommended by the American Diabetes Association (ADA) may be used as an aid in the diagnosis of diabetes mellitus.  Hemoglobin             Suggested A1C NGSP%              Diagnosis  <5.7                   Non Diabetic  5.7-6.4                Pre-Diabetic  >6.4                   Diabetic  <7.0                   Glycemic control for                       adults with diabetes.      CBG: Recent Labs  Lab 04/22/24 1657 04/22/24 1819 04/23/24 0106 04/23/24 0319 04/23/24 0737  GLUCAP 265* 203* 109* 101* 98    Review of Systems:   She denies chest pain no orthopnea or PND.  Does endorse increased shortness of breath dyspnea on exertion, chronic diarrhea largely unchanged.  Comprehensive review of systems otherwise negative.  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   Surgical History:   Past Surgical History:  Procedure Laterality Date   APPENDECTOMY     BREAST LUMPECTOMY Right 04-13-2009   W/ SLN BX   BREAST LUMPECTOMY WITH RADIOACTIVE SEED LOCALIZATION Left 06/11/2019   Procedure: LEFT BREAST LUMPECTOMY WITH RADIOACTIVE SEED LOCALIZATION;  Surgeon: Curvin Deward MOULD, MD;  Location[facility] OR;  Service: General;  Laterality: Left;   CARDIAC CATHETERIZATION  11-11-2005 DR VICTORY SHARPS   MODERATE LAD DISEASE/ NORMAL LVF/ EF 65-75%   CATARACT EXTRACTION W/ INTRAOCULAR LENS IMPLANT Bilateral 04/26/2013   CHOLECYSTECTOMY  1990   OPEN   COLONOSCOPY     CYSTOSCOPY W/ URETERAL STENT PLACEMENT Bilateral 05/07/2013   Procedure: CYSTOSCOPY WITH STENT REPLACEMENTS;  Surgeon: Ricardo Likens, MD;  Location: [facility];  Service: Urology;  Laterality: Bilateral;   CYSTOSCOPY WITH BIOPSY N/A 04/07/2013   Procedure: CYSTOSCOPY WITH BIOPSY and fulgeration;  Surgeon: Ricardo Likens, MD;  Location: [facility];  Service: Urology;  Laterality: N/A;   CYSTOSCOPY WITH RETROGRADE PYELOGRAM, URETEROSCOPY AND STENT PLACEMENT Bilateral 05/07/2013   Procedure: CYSTOSCOPY WITH RETROGRADE PYELOGRAM, URETEROSCOPY ;  Surgeon: Ricardo Likens, MD;  Location: [facility];  Service: Urology;  Laterality: Bilateral;   CYSTOSCOPY WITH RETROGRADE PYELOGRAM, URETEROSCOPY AND STENT PLACEMENT Bilateral 01/06/2019   Procedure: CYSTOSCOPY WITH RETROGRADE PYELOGRAM, URETEROSCOPY AND STENT PLACEMENT, FIRST STAGE;  Surgeon: Likens Ricardo, MD;  Location: [facility];  Service: Urology;  Laterality: Bilateral;   CYSTOSCOPY WITH RETROGRADE PYELOGRAM, URETEROSCOPY AND STENT PLACEMENT Bilateral 01/27/2019   Procedure: CYSTOSCOPY WITH RETROGRADE  PYELOGRAM, URETEROSCOPY AND STENT PLACEMENT;  Surgeon: Likens Ricardo, MD;  Location: [facility];  Service: Urology;  Laterality: Bilateral;  75 MINS   CYSTOSCOPY WITH STENT PLACEMENT Bilateral 04/07/2013   Procedure: CYSTOSCOPY WITH STENT PLACEMENT;  Surgeon: Ricardo Likens, MD;  Location: [facility];  Service: Urology;  Laterality: Bilateral;   DX LAPAROSCOPY W/ PERITONEAL AND OMENTAL BX'S AND WASHINGS  02-16-2008   EXCISION RIGHT BREAST MASS  02-10-2002   EXP. LAP. EXTENSIVE ADHESIOLYSIS/ RESECTION TERMINAL ILEUM , ASCENDING AND DESCENDING COLON WITH CREATION ILEOSTOMY AND MUCOUS FISTULA  02-19-2008   PERFERATION AND ABD. CANCER--  TAKEDOWN ILEOSTOMY 08-23-2008   HOLMIUM LASER APPLICATION Bilateral 01/06/2019   Procedure: HOLMIUM LASER APPLICATION;  Surgeon: Likens Ricardo, MD;  Location: [facility];  Service: Urology;  Laterality: Bilateral;   HOLMIUM LASER APPLICATION Bilateral 01/27/2019   Procedure: HOLMIUM LASER APPLICATION;  Surgeon: Likens Ricardo, MD;  Location: [facility];  Service: Urology;  Laterality: Bilateral;   I & D EXTREMITY Right 02/02/2022   Procedure: IRRIGATION AND DEBRIDEMENT HAND;  Surgeon: Shari Easter, MD;  Location[facility] OR;  Service: Orthopedics;  Laterality: Right;   ILEOSTOMY CLOSURE  08/2008   TOTAL ABDOMINAL HYSTERECTOMY W/ BILATERAL SALPINGOOPHORECTOMY  1976   TOTAL ABDOMINAL HYSTERECTOMY W/ BILATERAL SALPINGOOPHORECTOMY  1976   VULVAR LESION REMOVAL N/A 08/22/2014   Procedure: EXCISION OF VULVAR CYST  ;  Surgeon: Ronal Elvie Pinal, MD;  Location: [facility];  Service: Gynecology;  Laterality: N/A;   WOUND DEBRIDEMENT  05/28/2012   Procedure: DEBRIDEMENT ABDOMINAL WOUND;  Surgeon: Sherlean JINNY Laughter, MD;  Location: [facility];  Service: General;  Laterality: N/A;  excision chronic wound abdominal wall     Social History:   reports that she has never smoked. She has never used smokeless tobacco. She reports that she does not drink alcohol and does not use drugs.    Family History:  Her family history includes Breast cancer in her sister; Cancer in her sister; Heart disease in her mother; Hyperlipidemia in her mother.   Allergies Allergies  Allergen Reactions   Ramipril Itching     Home Medications  Prior to Admission medications   Medication Sig Start Date End Date Taking? Authorizing Provider  aspirin  EC 81 MG tablet Take 81 mg by mouth daily.    [provider]  BELSOMRA 10 MG TABS Take 10 mg by mouth at bedtime. 01/20/19   [provider]  Calcium  Carbonate-Vitamin D  (CALTRATE 600+D PO) Take 1 tablet by mouth 2 (two) times daily.    [provider]  Cholecalciferol  (VITAMIN D3) 50 MCG (2000 UT) TABS Take 2,000 Units by mouth in the morning and at bedtime.    [provider]  cyanocobalamin  (VITAMIN B12) 1000 MCG tablet Take 1,000 mcg by mouth daily.    [provider]  diltiazem  (TIAZAC ) 360 MG 24 hr capsule Take 360 mg by mouth daily. 01/29/24   [provider]  furosemide  (LASIX ) 40 MG tablet Take 1 tablet (40 mg total) by mouth daily. 04/12/24 04/12/25  Fairy Frames, MD  hydroxychloroquine  (PLAQUENIL ) 200 MG tablet Take 200 mg by mouth daily. 11/08/21   [provider]  isosorbide  mononitrate (IMDUR ) 60 MG 24 hr tablet Take 1 tablet (60 mg total) by mouth daily. 05/16/23   Jeffrie Oneil BROCKS, MD  loperamide  (IMODIUM ) 2 MG capsule Take 1 capsule (2 mg total) by mouth as needed for diarrhea or loose stools. Patient not taking: Reported on 04/22/2024 12/04/23   Ezenduka, Nkeiruka J, MD  magnesium  oxide (MAG-OX) 400 MG tablet Take 2 tablets (800 mg total) by  mouth 2 (two) times daily. 02/27/24   [name], MD  melatonin 3 MG TABS tablet Take 1 tablet (3 mg total) by mouth at bedtime as needed. 12/29/23   [name], MD  metoprolol  tartrate (LOPRESSOR ) 50 MG tablet Take 1 tablet (50 mg total) by mouth 2 (two) times daily. 01/20/24 04/22/24  Parthenia Olivia HERO, PA-C  Multiple Vitamins-Minerals  (DECUBI-VITE PO) Take 1 capsule by mouth daily at 6 (six) AM. For wound healing    [provider]  Multiple Vitamins-Minerals (MULTIVITAMIN WOMEN 50+) TABS Take 1 tablet by mouth daily with breakfast.    [provider]  Neomycin-Bacitracin-Polymyxin (TRIPLE ANTIBIOTIC) OINT Apply topically daily. To skin tears prn    [provider]  oxyCODONE  (OXY IR/ROXICODONE ) 5 MG immediate release tablet Take 1 tablet (5 mg total) by mouth every 6 (six) hours as needed. 03/26/24   [name], NP  pantoprazole  (PROTONIX ) 20 MG tablet Take 1 tablet (20 mg total) by mouth daily. 12/05/23   Ezenduka, Nkeiruka J, MD  potassium chloride  (KLOR-CON ) 20 MEQ packet Take 20 mEq by mouth 2 (two) times daily. 01/27/24   Jeffrie Oneil BROCKS, MD  rosuvastatin  (CRESTOR ) 10 MG tablet Take 10 mg by mouth at bedtime.    [provider]  sodium bicarbonate  650 MG tablet Take 1 tablet (650 mg total) by mouth 2 (two) times daily. 12/08/23   [name], [name], MD  tiZANidine  (ZANAFLEX ) 4 MG tablet Take 4 mg by mouth 2 (two) times daily as needed for muscle spasms. Patient not taking: Reported on 04/22/2024 10/10/23   [provider]  triamcinolone  (KENALOG) 0.1 % Apply 1 application  topically as needed (for rashes- affected areas). Patient not taking: Reported on 04/22/2024 04/04/20   [provider]     Critical care time: n/a     Donnice JONELLE Beals, MD See TRACEY

## 2024-04-23 NOTE — Code Documentation (Signed)
 Stroke Response Nurse Documentation Code Documentation  [name] is a 89 y.o. female admitted to [facility]  on 04/22/2024 for Acute Renal Failure with past medical hx of metastatic breast and ovarian cancer, chronic diastolic CHF, CKD, CAD, anemia. On No antithrombotic. Code stroke was activated by Dr. Annella .   Patient on 5W unit where she was LKW this morning 07/18 and now complaining of right sided weakness. Per Dr. Annella, patient was complaining of right wrist weakness this morning prior to transfer out of ICU this morning. Upon transfer, Dr Annella was notified the patient right arm was weaker compared to his assessment this morning.   Stroke team at the bedside after patient activation. Patient to CT with team. NIHSS 3, see documentation for details and code stroke times. Patient with right arm weakness and right leg weakness on exam. The following imaging was completed:  CT Head. Patient is not a candidate for IV Thrombolytic due to unclear last known normal time. Patient is not a candidate for IR due to no LVO noted on imaging.   Care/Plan: VS/NIHSS q2 x12hr, then q4hour.   Process Delays Noted: n/a  Bedside handoff with RN [name]/[name].    [name]  Stroke Response RN

## 2024-04-24 ENCOUNTER — Inpatient Hospital Stay (HOSPITAL_COMMUNITY)

## 2024-04-24 DIAGNOSIS — E875 Hyperkalemia: Secondary | ICD-10-CM | POA: Diagnosis not present

## 2024-04-24 DIAGNOSIS — I130 Hypertensive heart and chronic kidney disease with heart failure and stage 1 through stage 4 chronic kidney disease, or unspecified chronic kidney disease: Secondary | ICD-10-CM

## 2024-04-24 DIAGNOSIS — N179 Acute kidney failure, unspecified: Secondary | ICD-10-CM | POA: Diagnosis not present

## 2024-04-24 DIAGNOSIS — Z7901 Long term (current) use of anticoagulants: Secondary | ICD-10-CM

## 2024-04-24 DIAGNOSIS — I4819 Other persistent atrial fibrillation: Secondary | ICD-10-CM

## 2024-04-24 DIAGNOSIS — I6340 Cerebral infarction due to embolism of unspecified cerebral artery: Secondary | ICD-10-CM

## 2024-04-24 DIAGNOSIS — I5030 Unspecified diastolic (congestive) heart failure: Secondary | ICD-10-CM

## 2024-04-24 DIAGNOSIS — N189 Chronic kidney disease, unspecified: Secondary | ICD-10-CM

## 2024-04-24 DIAGNOSIS — I10 Essential (primary) hypertension: Secondary | ICD-10-CM | POA: Diagnosis not present

## 2024-04-24 DIAGNOSIS — I482 Chronic atrial fibrillation, unspecified: Secondary | ICD-10-CM | POA: Diagnosis not present

## 2024-04-24 LAB — LIPID PANEL
Cholesterol: 67 mg/dL (ref 0–200)
HDL: 32 mg/dL — ABNORMAL LOW (ref >40)
LDL Cholesterol: 20 mg/dL (ref 0–99)
Total CHOL/HDL Ratio: 2.1 ratio
Triglycerides: 74 mg/dL (ref <150)
VLDL: 15 mg/dL (ref 0–40)

## 2024-04-24 LAB — RENAL FUNCTION PANEL
Albumin: 2.2 g/dL — ABNORMAL LOW (ref 3.5–5.0)
Anion gap: 13 (ref 5–15)
BUN: 23 mg/dL (ref 8–23)
CO2: 30 mmol/L (ref 22–32)
Calcium: 7.8 mg/dL — ABNORMAL LOW (ref 8.9–10.3)
Chloride: 93 mmol/L — ABNORMAL LOW (ref 98–111)
Creatinine, Ser: 1.72 mg/dL — ABNORMAL HIGH (ref 0.44–1.00)
GFR, Estimated: 28 mL/min — ABNORMAL LOW (ref >60)
Glucose, Bld: 120 mg/dL — ABNORMAL HIGH (ref 70–99)
Phosphorus: 3.8 mg/dL (ref 2.5–4.6)
Potassium: 3.3 mmol/L — ABNORMAL LOW (ref 3.5–5.1)
Sodium: 136 mmol/L (ref 135–145)

## 2024-04-24 LAB — HEPATITIS B SURFACE ANTIBODY, QUANTITATIVE: Hep B S AB Quant (Post): <3.5 m[IU]/mL — ABNORMAL LOW

## 2024-04-24 LAB — GLUCOSE, CAPILLARY
Glucose-Capillary: 104 mg/dL — ABNORMAL HIGH (ref 70–99)
Glucose-Capillary: 125 mg/dL — ABNORMAL HIGH (ref 70–99)
Glucose-Capillary: 130 mg/dL — ABNORMAL HIGH (ref 70–99)
Glucose-Capillary: 164 mg/dL — ABNORMAL HIGH (ref 70–99)
Glucose-Capillary: 93 mg/dL (ref 70–99)
Glucose-Capillary: 97 mg/dL (ref 70–99)

## 2024-04-24 LAB — HEPATIC FUNCTION PANEL
ALT: 59 U/L — ABNORMAL HIGH (ref 0–44)
AST: 44 U/L — ABNORMAL HIGH (ref 15–41)
Albumin: 2.3 g/dL — ABNORMAL LOW (ref 3.5–5.0)
Alkaline Phosphatase: 153 U/L — ABNORMAL HIGH (ref 38–126)
Bilirubin, Direct: 0.1 mg/dL (ref 0.0–0.2)
Indirect Bilirubin: 0.5 mg/dL (ref 0.3–0.9)
Total Bilirubin: 0.6 mg/dL (ref 0.0–1.2)
Total Protein: 5.2 g/dL — ABNORMAL LOW (ref 6.5–8.1)

## 2024-04-24 MED ORDER — BENZONATATE 100 MG PO CAPS
100.0000 mg | ORAL_CAPSULE | Freq: Three times a day (TID) | ORAL | Status: DC | PRN
  Administered 2024-04-25 – 2024-04-28 (×3): 100 mg via ORAL
  Filled 2024-04-24 (×3): qty 1

## 2024-04-24 MED ORDER — APIXABAN 2.5 MG PO TABS
2.5000 mg | ORAL_TABLET | Freq: Two times a day (BID) | ORAL | Status: DC
  Administered 2024-04-24 – 2024-04-30 (×13): 2.5 mg via ORAL
  Filled 2024-04-24 (×13): qty 1

## 2024-04-24 MED ORDER — SODIUM CHLORIDE 0.9% FLUSH
10.0000 mL | Freq: Two times a day (BID) | INTRAVENOUS | Status: DC
  Administered 2024-04-24 – 2024-04-30 (×7): 10 mL

## 2024-04-24 MED ORDER — METOPROLOL TARTRATE 50 MG PO TABS
50.0000 mg | ORAL_TABLET | Freq: Two times a day (BID) | ORAL | Status: DC
  Administered 2024-04-24 (×2): 50 mg via ORAL
  Filled 2024-04-24 (×2): qty 1

## 2024-04-24 MED ORDER — FUROSEMIDE 10 MG/ML IJ SOLN
80.0000 mg | Freq: Once | INTRAMUSCULAR | Status: AC
  Administered 2024-04-24: 80 mg via INTRAVENOUS
  Filled 2024-04-24: qty 8

## 2024-04-24 MED ORDER — HYDRALAZINE HCL 20 MG/ML IJ SOLN
10.0000 mg | Freq: Four times a day (QID) | INTRAMUSCULAR | Status: DC | PRN
  Administered 2024-04-24 – 2024-04-26 (×2): 10 mg via INTRAVENOUS
  Filled 2024-04-24 (×2): qty 1

## 2024-04-24 MED ORDER — SODIUM CHLORIDE 0.9% FLUSH: 10.0000 mL | INTRAVENOUS | Status: DC | PRN

## 2024-04-24 MED ORDER — DILTIAZEM HCL 30 MG PO TABS
30.0000 mg | ORAL_TABLET | Freq: Four times a day (QID) | ORAL | Status: DC
  Administered 2024-04-24 – 2024-04-25 (×4): 30 mg via ORAL
  Filled 2024-04-24 (×4): qty 1

## 2024-04-24 MED ORDER — APIXABAN 2.5 MG PO TABS: 2.5000 mg | ORAL_TABLET | Freq: Two times a day (BID) | ORAL | Status: DC

## 2024-04-24 MED ORDER — METOPROLOL TARTRATE 5 MG/5ML IV SOLN
5.0000 mg | INTRAVENOUS | Status: DC | PRN
  Administered 2024-04-24: 5 mg via INTRAVENOUS
  Filled 2024-04-24: qty 5

## 2024-04-24 MED ORDER — LABETALOL HCL 5 MG/ML IV SOLN
10.0000 mg | INTRAVENOUS | Status: DC | PRN
  Administered 2024-04-24 – 2024-04-26 (×3): 10 mg via INTRAVENOUS
  Filled 2024-04-24 (×3): qty 4

## 2024-04-24 NOTE — Plan of Care (Signed)
 Monitor reading Afib w/ HR 140 noted by overnight RN team. EKG ordered but pt actively off floor for MRA. No h/o Afib. Currently w/ Heparin  for DVT prophylaxis. Noted to have stroke yesterday at time of transfer from ICU and currently w/ ongoing stroke workup. Neuro team requesting PHTN per last note w/ lobatelol written for pressure above goal. Will review EKG when obtained after MRI.   Alison JINNY Heads, MD [facility]

## 2024-04-24 NOTE — Progress Notes (Signed)
 PHARMACY - ANTICOAGULATION CONSULT NOTE  Pharmacy Consult for apixaban  Indication: atrial fibrillation  Allergies  Allergen Reactions   Ramipril Itching    Patient Measurements:    Vital Signs: Temp: 98.8 F (37.1 C) (07/19 0800) Temp Source: Oral (07/19 0800) BP: 162/85 (07/19 0800) Pulse Rate: 122 (07/19 0841)  Labs: Recent Labs    04/22/24 1612 04/22/24 1624 04/22/24 1708 04/22/24 2046 04/23/24 0416  HGB 9.0*   < > 7.8* 8.8* 9.1*  HCT 29.8*   < > 23.0* 28.7* 27.8*  PLT 259  --   --  239 209  LABPROT  --   --   --   --  16.7*  INR  --   --   --   --  1.3*  CREATININE 3.09*   < > 3.10* 3.02* 1.58*   < > = values in this interval not displayed.    Estimated Creatinine Clearance: 20.3 mL/min (A) (by C-G formula based on SCr of 1.58 mg/dL (H)).   Medical History: 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   Breast cancer (HCC) 2010   RIGHT   CAD (coronary artery disease)    Dysuria    Family history of breast cancer    GERD (gastroesophageal reflux disease)    HX OF   Heart murmur    History of blood transfusion    History of breast cancer ONCOLOGIST-- DR CLORETTA   DX 2010--  RIGHT BREAST DCIS  HIGH GRADE S/P LUMPECTOMY W/ SLN BX--  NO RECURRENCE   History of kidney stones    History of ovarian cancer    2009--  S/P COLON RESECTION FOR PAPILLARY SEROUS CARCINOMA, BORDERLINE OVARIAN CANCER VERSUS PRIMARY PERITONEAL CARCINOMA WITH PSAMMOMA BODIES--  NO RECURRENCE   History of skin cancer    excision basal cell   Hyperlipidemia    Hypertension    Kidney stones 04/2013   New onset a-fib (HCC) 11/24/2023   Nocturia    Ovarian carcinoma (HCC)    papillary serous-low grade, recurrence found in fat necrosis during ileostomy   Renal stones 2014   Shingles 30 YRS AGO   Ureteral calculi    bilateral    Medications:  Medications Prior to Admission  Medication Sig Dispense Refill  Last Dose/Taking   aspirin  EC 81 MG tablet Take 81 mg by mouth daily.   04/22/2024   BELSOMRA 10 MG TABS Take 10 mg by mouth at bedtime.   04/21/2024   Calcium  Carbonate-Vitamin D  (CALTRATE 600+D PO) Take 1 tablet by mouth 2 (two) times daily.   04/22/2024   Cholecalciferol  (VITAMIN D3) 50 MCG (2000 UT) TABS Take 2,000 Units by mouth in the morning and at bedtime.   04/22/2024   cyanocobalamin  (VITAMIN B12) 1000 MCG tablet Take 1,000 mcg by mouth daily.   04/22/2024   diltiazem  (TIAZAC ) 360 MG 24 hr capsule Take 360 mg by mouth daily.   04/22/2024   furosemide  (LASIX ) 40 MG tablet Take 1 tablet (40 mg total) by mouth daily.   04/22/2024   hydroxychloroquine  (PLAQUENIL ) 200 MG tablet Take 200 mg by mouth daily.   04/22/2024   isosorbide  mononitrate (IMDUR ) 60 MG 24 hr tablet Take 1 tablet (60 mg total) by mouth daily. 90 tablet 2 04/22/2024   magnesium  oxide (MAG-OX) 400 MG tablet Take 2 tablets (800 mg total) by mouth 2 (two) times daily. 120 tablet 0 04/22/2024  melatonin 3 MG TABS tablet Take 1 tablet (3 mg total) by mouth at bedtime as needed. (Patient taking differently: Take 3 mg by mouth at bedtime as needed (sleep).)   Unknown   metoprolol  tartrate (LOPRESSOR ) 50 MG tablet Take 1 tablet (50 mg total) by mouth 2 (two) times daily. 180 tablet 3 04/22/2024   Multiple Vitamins-Minerals (DECUBI-VITE PO) Take 1 capsule by mouth daily at 6 (six) AM. For wound healing   04/22/2024   Multiple Vitamins-Minerals (MULTIVITAMIN WOMEN 50+) TABS Take 1 tablet by mouth daily with breakfast.   04/22/2024   Neomycin-Bacitracin-Polymyxin (TRIPLE ANTIBIOTIC) OINT Apply topically daily. To skin tears prn   04/22/2024   oxyCODONE  (OXY IR/ROXICODONE ) 5 MG immediate release tablet Take 1 tablet (5 mg total) by mouth every 6 (six) hours as needed. 30 tablet 0 Unknown   pantoprazole  (PROTONIX ) 20 MG tablet Take 1 tablet (20 mg total) by mouth daily.   04/22/2024   Potassium Chloride  ER 20 MEQ TBCR Take 1 tablet by mouth 2 (two)  times daily.   04/22/2024   rosuvastatin  (CRESTOR ) 10 MG tablet Take 10 mg by mouth at bedtime.   04/21/2024   sodium bicarbonate  650 MG tablet Take 1 tablet (650 mg total) by mouth 2 (two) times daily.   04/22/2024   tiZANidine  (ZANAFLEX ) 4 MG tablet Take 4 mg by mouth 2 (two) times daily as needed for muscle spasms.   Unknown   triamcinolone  (KENALOG) 0.1 % Apply 1 application  topically as needed (for rashes- affected areas).   Unknown   Scheduled:    stroke: early stages of recovery book   Does not apply Once   apixaban   2.5 mg Oral BID   Chlorhexidine  Gluconate Cloth  6 each Topical Q0600   diltiazem   30 mg Oral Q6H   insulin  aspart  0-9 Units Subcutaneous Q4H   isosorbide  mononitrate  60 mg Oral Daily   metoprolol  tartrate  50 mg Oral BID   sodium chloride  flush  10-40 mL Intracatheter Q12H   Infusions:   anticoagulant sodium citrate       Assessment: Pt with a hx of AF who is not on AC prior to admission. Now with new CVA. Plan to start apixaban  today with adjusted dose. She will remain on this those.   Age>80 Wt<60kg  Goal of Therapy:  Monitor platelets by anticoagulation protocol: Yes   Plan:  Apixaban  2.5mg  PO BID Rx will follow peripherally  [name] Batch, PharmD, BCIDP, AAHIVP, CPP Infectious Disease Pharmacist 04/24/2024 10:09 AM

## 2024-04-24 NOTE — Progress Notes (Addendum)
 STROKE TEAM PROGRESS NOTE   SUBJECTIVE (INTERVAL HISTORY) Her PT is at the bedside.  Overall her condition is stable. Pt sitting in chair, AAO x 3, still mildly lethargic with intermittent coughing, still has afib RVR on tele. Neuro intact, MRI showed small infarct, will start eliquis  today.    OBJECTIVE Temp:  [98.1 F (36.7 C)-98.8 F (37.1 C)] 98.8 F (37.1 C) (07/19 0800) Pulse Rate:  [70-129] 122 (07/19 0841) Cardiac Rhythm: Atrial flutter;Atrial fibrillation (07/19 0715) Resp:  [15-19] 15 (07/19 0800) BP: (153-194)/(50-98) 169/81 (07/19 1105) SpO2:  [92 %-97 %] 97 % (07/19 0800)  Recent Labs  Lab 04/23/24 1546 04/23/24 2007 04/23/24 2354 04/24/24 0407 04/24/24 0826  GLUCAP 90 142* 102* 93 97   Recent Labs  Lab 04/22/24 1041 04/22/24 1131 04/22/24 1612 04/22/24 1624 04/22/24 1708 04/22/24 2046 04/23/24 0416  NA 137 138 135 135 136 138 136  K 6.5* 6.5* 7.4* 7.4* 6.6* 6.5* 3.8  CL 108 109 110 112* 113* 112* 98  CO2 15* 17* 12*  --   --  14* 25  GLUCOSE 131* 128* 259* 264* 291* 160* 102*  BUN 45* 44* 46* 43* 43* 46* 17  CREATININE 2.98* 3.04* 3.09* 3.30* 3.10* 3.02* 1.58*  CALCIUM  8.4* 8.7* 8.0*  --   --  8.8* 8.6*  MG 1.1*  --  1.1*  --   --   --  2.0  PHOS  --   --   --   --   --  6.0* 3.8   Recent Labs  Lab 04/22/24 1041 04/22/24 1612 04/22/24 2046 04/23/24 0416  AST 25 49*  --  116*  ALT 36 39  --  104*  ALKPHOS 137* 118  --  200*  BILITOT 0.4 0.6  --  0.7  PROT 6.2* 5.5*  --  5.8*  ALBUMIN  3.5 2.5* 2.5* 2.7*   Recent Labs  Lab 04/22/24 1041 04/22/24 1612 04/22/24 1624 04/22/24 1708 04/22/24 2046 04/23/24 0416  WBC 11.3* 12.6*  --   --  16.1* 12.1*  NEUTROABS 9.3* 9.6*  --   --   --   --   HGB 9.2* 9.0* 9.2* 7.8* 8.8* 9.1*  HCT 29.5* 29.8* 27.0* 23.0* 28.7* 27.8*  MCV 93.7 96.8  --   --  96.0 89.1  PLT 228 259  --   --  239 209   No results for input(s): CKTOTAL, CKMB, CKMBINDEX, TROPONINI in the last 168 hours. Recent Labs     04/23/24 0416  LABPROT 16.7*  INR 1.3*   No results for input(s): COLORURINE, LABSPEC, PHURINE, GLUCOSEU, HGBUR, BILIRUBINUR, KETONESUR, PROTEINUR, UROBILINOGEN, NITRITE, LEUKOCYTESUR in the last 72 hours.  Invalid input(s): APPERANCEUR     Component Value Date/Time   CHOL  02/29/2008 0515    61        ATP III CLASSIFICATION:  <200     mg/dL   Desirable  799-760  mg/dL   Borderline High  >=759    mg/dL   High   TRIG 889 94/74/7990 0515   Lab Results  Component Value Date   HGBA1C 6.1 (H) 04/23/2024   No results found for: LABOPIA, COCAINSCRNUR, LABBENZ, AMPHETMU, THCU, LABBARB  No results for input(s): ETH in the last 168 hours.  I have personally reviewed the radiological images below and agree with the radiology interpretations.  MR ANGIO HEAD WO CONTRAST Result Date: 04/24/2024 CLINICAL DATA:  89 year old female code stroke presentation, neurologic deficit. Small area of patchy left centrum semiovale  ischemia on brain MRI last night. EXAM: MRA HEAD WITHOUT CONTRAST TECHNIQUE: Angiographic images of the Circle of Willis were acquired using MRA technique without intravenous contrast. COMPARISON:  Brain MRI 04/23/2024. FINDINGS: Anterior circulation: Antegrade flow in both ICA siphons. Left-side persistent trigeminal artery. No ICA siphon stenosis. Patent carotid termini. Dominant left and diminutive or absent right ACA A1 segment. Normal anterior communicating artery. Visible bilateral ACA branches are within normal limits. MCA M1 segments are tortuous, more so the left. There is mild to moderate left MCA M1 segment irregularity and stenosis on series 1020 image 12. MCA bifurcations are patent and bilateral MCA branches are within normal limits. Posterior circulation: Antegrade flow in the posterior circulation with dominant appearing distal right vertebral artery. Diminutive distal left vertebral artery appears to terminates in PICA. Right PICA  appears patent. Patent basilar artery without stenosis. And furthermore there is a left-sided persistent trigeminal artery (series 5, image 108), also contributing to the distal basilar. Normal SCA and left PCA origins. Fetal type right PCA origin. Left posterior communicating artery diminutive or absent. Left PCA branches are within normal limits. There is mild to moderate irregularity and stenosis of the right PCA P2 segment broadly (series 1032, image 9). Distal right PCA branches remain patent. Anatomic variants: Persistent left side trigeminal artery. Dominant right vertebral, left vertebral terminates in PICA. Fetal right PCA origin. Dominant left and diminutive or absent right ACA A1 segments. Other: No intracranial mass effect or ventriculomegaly. IMPRESSION: 1. Negative for large vessel occlusion. 2. Intracranial atherosclerosis, but mild for age overall. Mild to moderate irregularity and stenosis of the left MCA M1 and right PCA P2 segments. 3. Multiple vascular anatomic variations, including persistent Left side Trigeminal Artery. Electronically Signed   By: VEAR Hurst M.D.   On: 04/24/2024 07:07   MR BRAIN WO CONTRAST Result Date: 04/23/2024 EXAM: MRI BRAIN WITHOUT CONTRAST 04/23/2024 09:10:11 PM TECHNIQUE: Multiplanar multisequence MRI of the head/brain was performed without the administration of intravenous contrast. COMPARISON: None available. CLINICAL HISTORY: Stroke, follow up. FINDINGS: BRAIN AND VENTRICLES: Small area of early subacute or acute ischemia within the posterior left frontal white matter. Bilateral early confluent hyperintense T2-weighted signal within the cerebral white matter. No intracranial hemorrhage. No mass. No midline shift. No hydrocephalus. The sella is unremarkable. Normal flow voids. ORBITS: No acute abnormality. SINUSES AND MASTOIDS: No acute abnormality. BONES AND SOFT TISSUES: Normal marrow signal. No acute soft tissue abnormality. IMPRESSION: 1. Small area of early  subacute/acute ischemia within the posterior left frontal white matter. 2. Bilateral early confluent hyperintense T2-weighted signal within the cerebral white matter, most commonly due to chronic small vessel disease. Electronically signed by: Franky Stanford MD 04/23/2024 09:57 PM EDT RP Workstation: [id number]   ECHOCARDIOGRAM COMPLETE Result Date: 04/23/2024    ECHOCARDIOGRAM REPORT   Patient Name:   SUSANN LAWHORNE Date of Exam: 04/23/2024 Medical Rec #:  [id number]        Height:       66.0 in Accession #:    [id number]       Weight:       112.9 lb Date of Birth:  1933-10-24       BSA:          1.568 m Patient Age:    89 years         BP:           172/65 mmHg Patient Gender: F  HR:           84 bpm. Exam Location:  Inpatient Procedure: 2D Echo, Cardiac Doppler and Color Doppler (Both Spectral and Color            Flow Doppler were utilized during procedure). Indications:    Acute Renal Failure  History:        Patient has prior history of Echocardiogram examinations. Renal                 Failure.  Sonographer:    Vella Key Referring Phys: [phone] MATTHEW R HUNSUCKER IMPRESSIONS  1. Left ventricular ejection fraction, by estimation, is 65 to 70%. The left ventricle has normal function. The left ventricle has no regional wall motion abnormalities. There is mild left ventricular hypertrophy. Left ventricular diastolic parameters were normal.  2. Right ventricular systolic function is normal. The right ventricular size is mildly enlarged.  3. Left atrial size was moderately dilated.  4. Mild mitral valve regurgitation.  5. The aortic valve is tricuspid. Aortic valve regurgitation is not visualized. Aortic valve sclerosis/calcification is present, without any evidence of aortic stenosis. FINDINGS  Left Ventricle: Left ventricular ejection fraction, by estimation, is 65 to 70%. The left ventricle has normal function. The left ventricle has no regional wall motion abnormalities. The left ventricular  internal cavity size was small. There is mild left ventricular hypertrophy. Left ventricular diastolic parameters were normal. Right Ventricle: The right ventricular size is mildly enlarged. Right vetricular wall thickness was not assessed. Right ventricular systolic function is normal. Left Atrium: Left atrial size was moderately dilated. Right Atrium: Right atrial size was normal in size. Pericardium: There is no evidence of pericardial effusion. Mitral Valve: There is mild thickening of the mitral valve leaflet(s). Mild to moderate mitral annular calcification. Mild mitral valve regurgitation. Tricuspid Valve: The tricuspid valve is normal in structure. Tricuspid valve regurgitation is trivial. Aortic Valve: The aortic valve is tricuspid. Aortic valve regurgitation is not visualized. Aortic valve sclerosis/calcification is present, without any evidence of aortic stenosis. Pulmonic Valve: The pulmonic valve was normal in structure. Pulmonic valve regurgitation is trivial. Aorta: The aortic root and ascending aorta are structurally normal, with no evidence of dilitation. IAS/Shunts: No atrial level shunt detected by color flow Doppler.  LEFT VENTRICLE PLAX 2D LVIDd:         3.10 cm     Diastology LVIDs:         1.80 cm     LV e' medial:    12.80 cm/s LV PW:         1.30 cm     LV E/e' medial:  15.1 LV IVS:        1.10 cm     LV e' lateral:   13.50 cm/s LVOT diam:     1.70 cm     LV E/e' lateral: 14.3 LV SV:         70 LV SV Index:   45 LVOT Area:     2.27 cm  LV Volumes (MOD) LV vol d, MOD A2C: 58.4 ml LV vol d, MOD A4C: 83.9 ml LV vol s, MOD A2C: 15.4 ml LV vol s, MOD A4C: 32.8 ml LV SV MOD A2C:     43.0 ml LV SV MOD A4C:     83.9 ml LV SV MOD BP:      46.6 ml RIGHT VENTRICLE RV Basal diam:  3.90 cm RV S prime:     14.30 cm/s TAPSE (M-mode): 1.5 cm  LEFT ATRIUM             Index        RIGHT ATRIUM           Index LA diam:        4.80 cm 3.06 cm/m   RA Area:     12.00 cm LA Vol (A2C):   68.5 ml 43.68 ml/m  RA  Volume:   28.80 ml  18.36 ml/m LA Vol (A4C):   63.3 ml 40.36 ml/m LA Biplane Vol: 66.4 ml 42.34 ml/m  AORTIC VALVE LVOT Vmax:   135.00 cm/s LVOT Vmean:  105.000 cm/s LVOT VTI:    0.309 m  AORTA Ao Root diam: 2.80 cm Ao Asc diam:  2.80 cm MITRAL VALVE MV Area (PHT): 5.42 cm     SHUNTS MV Decel Time: 140 msec     Systemic VTI:  0.31 m MV E velocity: 193.00 cm/s  Systemic Diam: 1.70 cm MV A velocity: 64.50 cm/s MV E/A ratio:  2.99 Vina Gull MD Electronically signed by Vina Gull MD Signature Date/Time: 04/23/2024/3:01:59 PM    Final    CT HEAD CODE STROKE WO CONTRAST Result Date: 04/23/2024 CLINICAL DATA:  Code stroke. Neuro deficit, acute, stroke suspected. Right arm weakness. EXAM: CT HEAD WITHOUT CONTRAST TECHNIQUE: Contiguous axial images were obtained from the base of the skull through the vertex without intravenous contrast. RADIATION DOSE REDUCTION: This exam was performed according to the departmental dose-optimization program which includes automated exposure control, adjustment of the mA and/or kV according to patient size and/or use of iterative reconstruction technique. COMPARISON:  Head CT 04/21/2008. FINDINGS: Brain: Generalized cerebral atrophy. Patchy and ill-defined hypoattenuation within the cerebral white matter, nonspecific but compatible with moderate to advanced chronic small vessel ischemic disease. There is no acute intracranial hemorrhage. No demarcated cortical infarct. No extra-axial fluid collection. No evidence of an intracranial mass. No midline shift. Vascular: No hyperdense vessel.  Atherosclerotic calcifications. Skull: No calvarial fracture or aggressive osseous lesion. Sinuses/Orbits: No mass or acute finding within the imaged orbits. No significant paranasal sinus disease. ASPECTS [facility] Stroke Program Early CT Score) - Ganglionic level infarction (caudate, lentiform nuclei, internal capsule, insula, M1-M3 cortex): 7 - Supraganglionic infarction (M4-M6 cortex): 3 Total score  (0-10 with 10 being normal): 10 No evidence of an acute intracranial abnormality. These results were communicated to Dr. Matthews At 1:39 pmon 7/18/2025by text page via the [facility] messaging system. IMPRESSION: 1. No evidence of an acute intracranial abnormality. 2. Parenchymal atrophy and chronic small vessel ischemic disease, progressed from the prior head CT of 04/21/2008. Electronically Signed   By: Rockey Childs D.O.   On: 04/23/2024 13:39   CT CHEST WO CONTRAST Result Date: 04/22/2024 CLINICAL DATA:  History of lung carcinoma with metastatic disease, shortness of breath EXAM: CT CHEST WITHOUT CONTRAST TECHNIQUE: Multidetector CT imaging of the chest was performed following the standard protocol without IV contrast. RADIATION DOSE REDUCTION: This exam was performed according to the departmental dose-optimization program which includes automated exposure control, adjustment of the mA and/or kV according to patient size and/or use of iterative reconstruction technique. COMPARISON:  04/09/2024 FINDINGS: Cardiovascular: Heart is stable in size. Heavy coronary calcifications are noted. Atherosclerotic calcifications of the aorta are noted without aneurysmal dilatation. Mediastinum/Nodes: Thoracic inlet shows evidence of a left jugular dialysis catheter the esophagus as visualized is within normal limits. No hilar or mediastinal adenopathy is noted. Lungs/Pleura: Lungs again demonstrate bilateral lower lobe infiltrates with associated effusions right greater than left.  These changes have progressed in the interval from the prior exam. Scattered small nodules are again seen to include a 3 mm nodule in the left lower lobe on image number 81 of series 4. New patchy somewhat nodular density is noted in the left lower lobe on image number 58 of series 4 measuring up to 10 mm in average diameter. Given its abrupt onset and adjacent progressive infiltrate, this is felt to be postinflammatory in nature. Previously seen right  lower lobe nodule is now enveloped within the increasing basilar infiltrate. No other sizable nodules are seen. Upper Abdomen: As described on concomitant CT of the abdomen and pelvis. Musculoskeletal: Old healed rib fractures are seen. Heel metastatic changes are noted involving the vertebral bodies from T8-T12. T11 vertebral plana with increased kyphosis is seen. Previously seen nodule in the region of the left breast is again identified and stable. Again possibility of neoplastic lesion deserves consideration. IMPRESSION: Progressive bilateral lower lobe infiltrate with associated effusions. Stable parenchymal nodules when compared with the prior exam. Healed metastatic disease Aortic Atherosclerosis (ICD10-I70.0) and Emphysema (ICD10-J43.9). Electronically Signed   By: Oneil Devonshire M.D.   On: 04/22/2024 19:29   CT RENAL STONE STUDY Result Date: 04/22/2024 CLINICAL DATA:  Flank pain EXAM: CT ABDOMEN AND PELVIS WITHOUT CONTRAST TECHNIQUE: Multidetector CT imaging of the abdomen and pelvis was performed following the standard protocol without IV contrast. RADIATION DOSE REDUCTION: This exam was performed according to the departmental dose-optimization program which includes automated exposure control, adjustment of the mA and/or kV according to patient size and/or use of iterative reconstruction technique. COMPARISON:  03/25/2024 FINDINGS: Lower chest: Bilateral lower lobe consolidation and effusions are seen. This is worse on the right than the left. Hepatobiliary: Known hepatic lesions are not well visualized on this noncontrast study. Status post cholecystectomy. No biliary dilatation. Pancreas: Unremarkable. No pancreatic ductal dilatation or surrounding inflammatory changes. Spleen: Normal in size without focal abnormality. Adrenals/Urinary Tract: Left adrenal gland is within normal limits. Right adrenal nodule is again noted and stable. No follow-up is recommended. Kidneys are well visualized bilaterally.  Bilateral nonobstructing renal calculi are noted. The largest of these on the left measures 8 mm. Largest on the right measures approximately 3 mm. The ureters are within normal limits. The bladder is decompressed. Stomach/Bowel: Fecal material is noted scattered throughout the colon. No obstructive changes are seen. The appendix has been surgically removed. Small bowel and stomach are within normal limits. No obstructive changes are seen. Vascular/Lymphatic: Aortic atherosclerosis. No enlarged abdominal or pelvic lymph nodes. Reproductive: Status post hysterectomy. No adnexal masses. Other: No abdominal wall hernia or abnormality. No abdominopelvic ascites. Musculoskeletal: Vertebral plana is noted at T11. Increased sclerosis is noted in T10 and T12 consistent with prior metastatic disease. Anterolisthesis of L5 on S1 is noted. Metastatic disease in the L4 spinous process is noted as well. IMPRESSION: Bilateral lower lobe consolidation and effusions right greater than left. Bilateral nonobstructing renal calculi as described. Scattered fecal material without obstructive change. Healed metastatic bony lesions Known hepatic metastatic disease is not well appreciated on this noncontrast study. Electronically Signed   By: Oneil Devonshire M.D.   On: 04/22/2024 19:12   ECHOCARDIOGRAM COMPLETE Result Date: 04/09/2024    ECHOCARDIOGRAM REPORT   Patient Name:   ZNIYAH MIDKIFF Date of Exam: 04/09/2024 Medical Rec #:  [id number]        Height:       66.0 in Accession #:    [id number]  Weight:       109.9 lb Date of Birth:  [birth year]       BSA:          1.551 m Patient Age:    89 years         BP:           154/66 mmHg Patient Gender: F                HR:           99 bpm. Exam Location:  Inpatient Procedure: 2D Echo, Cardiac Doppler and Color Doppler (Both Spectral and Color            Flow Doppler were utilized during procedure). Indications:    Atrial Fibrillation  History:        Patient has prior history of  Echocardiogram examinations, most                 recent 12/24/2023. CHF, CAD, Arrythmias:Atrial Fibrillation; Risk                 Factors:Hypertension.  Sonographer:    Philomena Daring Referring Phys: [id number] MARSA NOVAK MELVIN IMPRESSIONS  1. Left ventricular ejection fraction, by estimation, is 60 to 65%. The left ventricle has normal function. The left ventricle has no regional wall motion abnormalities. There is moderate asymmetric left ventricular hypertrophy of the basal and septal segments. Left ventricular diastolic function could not be evaluated.  2. Right ventricular systolic function is normal. The right ventricular size is normal. Tricuspid regurgitation signal is inadequate for assessing PA pressure.  3. Left atrial size was severely dilated.  4. Right atrial size was mild to moderately dilated.  5. The mitral valve is abnormal. No evidence of mitral valve regurgitation. No evidence of mitral stenosis. Moderate to severe mitral annular calcification.  6. The aortic valve is tricuspid. Aortic valve regurgitation is mild. No aortic stenosis is present.  7. The inferior vena cava is dilated in size with >50% respiratory variability, suggesting right atrial pressure of 8 mmHg. Comparison(s): Changes from prior study are noted. AR is moderate on prior study. FINDINGS  Left Ventricle: Left ventricular ejection fraction, by estimation, is 60 to 65%. The left ventricle has normal function. The left ventricle has no regional wall motion abnormalities. Strain was performed and the global longitudinal strain is indeterminate. The left ventricular internal cavity size was normal in size. There is moderate asymmetric left ventricular hypertrophy of the basal and septal segments. Left ventricular diastolic function could not be evaluated due to atrial fibrillation. Left ventricular diastolic function could not be evaluated. Right Ventricle: The right ventricular size is normal. No increase in right ventricular wall  thickness. Right ventricular systolic function is normal. Tricuspid regurgitation signal is inadequate for assessing PA pressure. Left Atrium: Left atrial size was severely dilated. Right Atrium: Right atrial size was mild to moderately dilated. Pericardium: There is no evidence of pericardial effusion. Mitral Valve: The mitral valve is abnormal. Moderate to severe mitral annular calcification. No evidence of mitral valve regurgitation. No evidence of mitral valve stenosis. Tricuspid Valve: The tricuspid valve is normal in structure. Tricuspid valve regurgitation is mild . No evidence of tricuspid stenosis. Aortic Valve: The aortic valve is tricuspid. Aortic valve regurgitation is mild. No aortic stenosis is present. Pulmonic Valve: The pulmonic valve was grossly normal. Pulmonic valve regurgitation is not visualized. No evidence of pulmonic stenosis. Aorta: The aortic root and ascending aorta are structurally normal, with no evidence of  dilitation. Venous: The inferior vena cava is dilated in size with greater than 50% respiratory variability, suggesting right atrial pressure of 8 mmHg. IAS/Shunts: The interatrial septum was not well visualized. Additional Comments: 3D was performed not requiring image post processing on an independent workstation and was indeterminate.  LEFT VENTRICLE PLAX 2D LVIDd:         3.80 cm   Diastology LVIDs:         2.50 cm   LV e' medial:    4.57 cm/s LV PW:         1.00 cm   LV E/e' medial:  26.8 LV IVS:        1.10 cm   LV e' lateral:   8.27 cm/s LVOT diam:     1.80 cm   LV E/e' lateral: 14.8 LV SV:         58 LV SV Index:   37 LVOT Area:     2.54 cm  RIGHT VENTRICLE            IVC RV S prime:     7.83 cm/s  IVC diam: 2.10 cm TAPSE (M-mode): 1.3 cm LEFT ATRIUM             Index        RIGHT ATRIUM           Index LA Vol (A2C):   79.0 ml 50.95 ml/m  RA Area:     17.50 cm LA Vol (A4C):   69.2 ml 44.63 ml/m  RA Volume:   48.70 ml  31.41 ml/m LA Biplane Vol: 80.0 ml 51.60 ml/m   AORTIC VALVE LVOT Vmax:   100.00 cm/s LVOT Vmean:  69.500 cm/s LVOT VTI:    0.227 m  AORTA Ao Root diam: 2.50 cm Ao Asc diam:  3.00 cm MITRAL VALVE MV Area (PHT): 4.21 cm     SHUNTS MV Decel Time: 180 msec     Systemic VTI:  0.23 m MV E velocity: 122.50 cm/s  Systemic Diam: 1.80 cm MV A velocity: 104.00 cm/s MV E/A ratio:  1.18 Vishnu Priya Mallipeddi Electronically signed by Diannah Late Mallipeddi Signature Date/Time: 04/09/2024/4:07:35 PM    Final    CT Angio Chest PE W and/or Wo Contrast Result Date: 04/09/2024 CLINICAL DATA:  MC-CTPulmonary embolism (PE) suspected, high prob history of lung cancer. Sternal chest pain LEFT shoulder pain. * Tracking Code: BO * EXAM: CT ANGIOGRAPHY CHEST WITH CONTRAST TECHNIQUE: Multidetector CT imaging of the chest was performed using the standard protocol during bolus administration of intravenous contrast. Multiplanar CT image reconstructions and MIPs were obtained to evaluate the vascular anatomy. RADIATION DOSE REDUCTION: This exam was performed according to the departmental dose-optimization program which includes automated exposure control, adjustment of the mA and/or kV according to patient size and/or use of iterative reconstruction technique. CONTRAST:  60mL OMNIPAQUE  IOHEXOL  350 MG/ML SOLN COMPARISON:  CT chest 03/25/2024 FINDINGS: Cardiovascular: No filling defects within the pulmonary arteries to suggest acute pulmonary embolism. No significant vascular findings. Normal heart size. No pericardial effusion. Mediastinum/Nodes: No axillary or supraclavicular adenopathy. No mediastinal or hilar adenopathy. No pericardial fluid. Esophagus normal. Lungs/Pleura: Bilateral pleural effusions similar prior. Moderate effusion on the RIGHT and small on the LEFT. There is passive atelectasis of the RIGHT lower lobe. Small subcentimeter nodules in the lung base not changed from recent CT. Increased consolidation at the LEFT lung base (image 259/series 7 and 90/series 6. Upper  Abdomen: Hepatic metastasis not well appreciated no acute findings  Musculoskeletal: Sclerotic skeletal metastasis again noted. No change. Surgical clips in the lateral RIGHT breast Review of the MIP images confirms the above findings. IMPRESSION: 1. No evidence acute pulmonary embolism. 2. Bilateral pleural effusions with passive atelectasis of the RIGHT lower lobe. 3. Increased consolidation at the LEFT lung base. Concern for LEFT lower lobe pneumonia. 4. Stable small pulmonary nodules at the lung bases. 5. Stable skeletal metastasis. Electronically Signed   By: Jackquline Boxer M.D.   On: 04/09/2024 11:23   DG Chest Port 1 View Result Date: 04/09/2024 CLINICAL DATA:  Chest pain EXAM: PORTABLE CHEST 1 VIEW COMPARISON:  01/01/2024 FINDINGS: Low volume film. The cardio pericardial silhouette is enlarged. Vascular congestion without overt airspace pulmonary edema. Bibasilar atelectasis/infiltrate is similar in the interval with small bilateral pleural effusions. No acute bony abnormality. Telemetry leads overlie the chest. IMPRESSION: 1. Low volume film with vascular congestion and small bilateral pleural effusions. 2. Bibasilar atelectasis/infiltrate is similar in the interval. Electronically Signed   By: Camellia Candle M.D.   On: 04/09/2024 08:57     PHYSICAL EXAM  Temp:  [98.1 F (36.7 C)-98.8 F (37.1 C)] 98.8 F (37.1 C) (07/19 0800) Pulse Rate:  [70-129] 122 (07/19 0841) Resp:  [15-19] 15 (07/19 0800) BP: (153-194)/(50-98) 169/81 (07/19 1105) SpO2:  [92 %-97 %] 97 % (07/19 0800)  General - Well nourished, well developed, in no apparent distress, mildly lethargic.  Ophthalmologic - fundi not visualized due to noncooperation.  Cardiovascular - irregularly irregular heart rate and rhythm.  Mental Status -  Level of arousal and orientation to time, place, and person were intact. Language including expression, naming, repetition, comprehension was assessed and found intact Fund of Knowledge  was assessed and was intact.  Cranial Nerves II - XII - II - Visual field intact OU. III, IV, VI - Extraocular movements intact. V - Facial sensation intact bilaterally. VII - Facial movement intact bilaterally. VIII - Hearing & vestibular intact bilaterally. X - Palate elevates symmetrically. XI - Chin turning & shoulder shrug intact bilaterally. XII - Tongue protrusion intact.  Motor Strength - The patient's strength was 4/5 in all extremities and pronator drift was absent.  Bulk was normal and fasciculations were absent.   Motor Tone - Muscle tone was assessed at the neck and appendages and was normal.  Reflexes - The patient's reflexes were symmetrical in all extremities and she had no pathological reflexes.  Sensory - Light touch, temperature/pinprick were assessed and were symmetrical.    Coordination - The patient had normal movements in the hands with no ataxia or dysmetria although slow.  Tremor was absent.  Gait and Station - deferred.   ASSESSMENT/PLAN [name] is a 89 y.o. female with history of CKD, CAD, HTN, HLD, afib not on AC, cCHF, metastatic breast cancer and ovarian cancer admitted for AKI on CKD, hyperkalemia. S/p dialysis but found to have R arm weakness. No TNK given due to outside window.    Stroke:  left frontal small infarct embolic secondary to Afib not on AC CT no acute finding MRI  Small area of early subacute/acute ischemia within the posterior left frontal white matter. MRA  Intracranial atherosclerosis, but mild for age overall. Mild to moderate irregularity and stenosis of the left MCA M1 and right PCA P2 segments. Carotid Doppler  pending 2D Echo  EF 65-70% LDL pending HgbA1c 6.1 eliquis  for VTE prophylaxis aspirin  81 mg daily prior to admission, now on Eliquis  (apixaban ) daily.  Patient counseled to  be compliant with her antithrombotic medications Ongoing aggressive stroke risk factor management Therapy recommendations:   pending Disposition:  pending  Persistent AF Follows with Dr. Jeffrie  Not on [facility] due to frailty and anemia  Now has afib RVR - on cardizem  and metoprolol  Put on eliquis  today  Hypertension Stable On metoprolol  and imdur  Long term BP goal normotensive  Hyperlipidemia Home meds:  crestor  10  LDL pending, goal < 70 AST ALT 116/104 -> pending Consider statin once LFT normalizes Continue statin at discharge  Other Stroke Risk Factors Advanced age dCHF on imdur  CAD  Other Active Problems AKI on CKD, Cre 3.01--3.02--1.58 Hyperkalemia K 6.5--dialysis --3.8 Leukocytosis WBC 16.1--12.1 Anemia due to chronic disease CKD, Hb 8.8--9.1  Hospital day # 2  I discussed with Dr. Raenelle. I spent extensive total face-to-face time with the patient and family, reviewing test results, images and medication, and discussing the diagnosis, treatment plan and potential prognosis. This patient's care requiresreview of multiple databases, neurological assessment, discussion with family, other specialists and medical decision making of high complexity.  Ary Cummins, MD PhD Stroke Neurology 04/24/2024 11:42 AM    To contact Stroke Continuity provider, please refer to [email or web address]. After hours, contact General Neurology

## 2024-04-24 NOTE — Evaluation (Signed)
 Speech Language Pathology Evaluation Patient Details Name: [name] MRN: [id number] DOB: [birth year] Today's Date: 04/24/2024 Time: 8583-8565 SLP Time Calculation (min) (ACUTE ONLY): 18 min  Problem List:  Patient Active Problem List   Diagnosis Date Noted   Acute renal failure (HCC) 04/22/2024   Atrial fibrillation with RVR (HCC) 04/09/2024   Acute on chronic diastolic CHF (congestive heart failure) (HCC) 04/09/2024   CAP (community acquired pneumonia) 04/09/2024   Elevated troponin 01/02/2024   Electrolyte abnormality 12/27/2023   Persistent atrial fibrillation (HCC) 12/27/2023   Diastolic CHF, chronic (HCC) 12/25/2023   Chronic kidney disease, stage 3b (HCC) 12/23/2023   Thrombocytopenia (HCC) 12/06/2023   Medication management 12/03/2023   Counseling and coordination of care 12/02/2023   Odynophagia 12/02/2023   Palliative care encounter 12/02/2023   Protein-calorie malnutrition, severe 12/02/2023   Failure to thrive 11/30/2023   Dysphagia 11/30/2023   Bicytopenia 11/27/2023   Hypokalemia 11/27/2023   Hypertrophic cardiomyopathy (HCC) 11/24/2023   Demand ischemia (HCC) 11/24/2023   Chronic anemia 11/24/2023   Malignant neoplasm of ovary (HCC) 11/24/2023   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) 11/12/2023   Infection of hand 02/02/2022   Genetic testing 07/20/2019   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/20/2019   Family history of breast cancer    Carcinoma of upper-outer quadrant of left breast in female, estrogen receptor positive (HCC) 05/19/2019   Left carotid bruit 01/21/2017   Hypertension    Hyperlipidemia    GERD (gastroesophageal reflux disease)    Ureteral calculi    History of breast cancer    History of ovarian cancer    History of skin cancer    Frequency of urination    Dysuria    Kidney stones    CAD (coronary artery disease)    Ductal carcinoma (HCC) 08/11/2013   Serous tumor, 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    Past Medical History:  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   Breast cancer (HCC) 2010   RIGHT   CAD (coronary artery disease)    Dysuria    Family history of breast cancer    GERD (gastroesophageal reflux disease)    HX OF   Heart murmur    History of blood transfusion    History of breast cancer ONCOLOGIST-- DR CLORETTA   DX 2010--  RIGHT BREAST DCIS  HIGH GRADE S/P LUMPECTOMY W/ SLN BX--  NO RECURRENCE   History of kidney stones    History of ovarian cancer    2009--  S/P COLON RESECTION FOR PAPILLARY SEROUS CARCINOMA, BORDERLINE OVARIAN CANCER VERSUS PRIMARY PERITONEAL CARCINOMA WITH PSAMMOMA BODIES--  NO RECURRENCE   History of skin cancer    excision basal cell   Hyperlipidemia    Hypertension    Kidney stones 04/2013   New onset a-fib (HCC) 11/24/2023   Nocturia    Ovarian carcinoma (HCC)    papillary serous-low grade, recurrence found in fat necrosis during ileostomy   Renal stones 2014   Shingles 30 YRS AGO   Ureteral calculi    bilateral   Past Surgical History:  Past Surgical History:  Procedure Laterality Date   APPENDECTOMY     BREAST LUMPECTOMY Right 04-13-2009   W/ SLN BX   BREAST LUMPECTOMY WITH RADIOACTIVE SEED LOCALIZATION Left 06/11/2019   Procedure: LEFT BREAST LUMPECTOMY WITH RADIOACTIVE SEED LOCALIZATION;  Surgeon: Curvin Deward MOULD, MD;  Location: [facility];  Service: General;  Laterality: Left;   CARDIAC CATHETERIZATION  11-11-2005 DR VICTORY SHARPS   MODERATE LAD DISEASE/ NORMAL LVF/ EF 65-75%   CATARACT EXTRACTION W/ INTRAOCULAR LENS IMPLANT Bilateral 04/26/2013   CHOLECYSTECTOMY  1990   OPEN   COLONOSCOPY     CYSTOSCOPY W/ URETERAL STENT PLACEMENT Bilateral 05/07/2013   Procedure: CYSTOSCOPY WITH STENT REPLACEMENTS;  Surgeon: Ricardo Likens, MD;  Location: [facility];  Service: Urology;  Laterality: Bilateral;   CYSTOSCOPY WITH BIOPSY N/A 04/07/2013    Procedure: CYSTOSCOPY WITH BIOPSY and fulgeration;  Surgeon: Ricardo Likens, MD;  Location: [facility];  Service: Urology;  Laterality: N/A;   CYSTOSCOPY WITH RETROGRADE PYELOGRAM, URETEROSCOPY AND STENT PLACEMENT Bilateral 05/07/2013   Procedure: CYSTOSCOPY WITH RETROGRADE PYELOGRAM, URETEROSCOPY ;  Surgeon: Ricardo Likens, MD;  Location: [facility];  Service: Urology;  Laterality: Bilateral;   CYSTOSCOPY WITH RETROGRADE PYELOGRAM, URETEROSCOPY AND STENT PLACEMENT Bilateral 01/06/2019   Procedure: CYSTOSCOPY WITH RETROGRADE PYELOGRAM, URETEROSCOPY AND STENT PLACEMENT, FIRST STAGE;  Surgeon: Likens Ricardo, MD;  Location: [facility];  Service: Urology;  Laterality: Bilateral;   CYSTOSCOPY WITH RETROGRADE PYELOGRAM, URETEROSCOPY AND STENT PLACEMENT Bilateral 01/27/2019   Procedure: CYSTOSCOPY WITH RETROGRADE PYELOGRAM, URETEROSCOPY AND STENT PLACEMENT;  Surgeon: Likens Ricardo, MD;  Location: [facility];  Service: Urology;  Laterality: Bilateral;  75 MINS   CYSTOSCOPY WITH STENT PLACEMENT Bilateral 04/07/2013   Procedure: CYSTOSCOPY WITH STENT PLACEMENT;  Surgeon: Ricardo Likens, MD;  Location: [facility];  Service: Urology;  Laterality: Bilateral;   DX LAPAROSCOPY W/ PERITONEAL AND OMENTAL BX'S AND WASHINGS  02-16-2008   EXCISION RIGHT BREAST MASS  02-10-2002   EXP. LAP. EXTENSIVE ADHESIOLYSIS/ RESECTION TERMINAL ILEUM , ASCENDING AND DESCENDING COLON WITH CREATION ILEOSTOMY AND MUCOUS FISTULA  02-19-2008   PERFERATION AND ABD. CANCER--  TAKEDOWN ILEOSTOMY 08-23-2008   HOLMIUM LASER APPLICATION Bilateral 01/06/2019   Procedure: HOLMIUM LASER APPLICATION;  Surgeon: Likens Ricardo, MD;  Location: [facility];  Service: Urology;  Laterality: Bilateral;   HOLMIUM LASER APPLICATION Bilateral 01/27/2019   Procedure: HOLMIUM LASER APPLICATION;  Surgeon: Likens Ricardo, MD;  Location: [facility];  Service: Urology;  Laterality: Bilateral;   I & D EXTREMITY Right 02/02/2022   Procedure: IRRIGATION AND DEBRIDEMENT HAND;   Surgeon: Shari Easter, MD;  Location: [facility] OR;  Service: Orthopedics;  Laterality: Right;   ILEOSTOMY CLOSURE  08/2008   TOTAL ABDOMINAL HYSTERECTOMY W/ BILATERAL SALPINGOOPHORECTOMY  1976   TOTAL ABDOMINAL HYSTERECTOMY W/ BILATERAL SALPINGOOPHORECTOMY  1976   VULVAR LESION REMOVAL N/A 08/22/2014   Procedure: EXCISION OF VULVAR CYST  ;  Surgeon: Ronal Elvie Pinal, MD;  Location: [facility];  Service: Gynecology;  Laterality: N/A;   WOUND DEBRIDEMENT  05/28/2012   Procedure: DEBRIDEMENT ABDOMINAL WOUND;  Surgeon: Sherlean JINNY Laughter, MD;  Location: [facility];  Service: General;  Laterality: N/A;  excision chronic wound abdominal wall   HPI:  The pt is an 89 yo female presenting [facility] 04/[facility]24 for SOB, bradycardia, and hyperkalemia. Further workup for Acute Renal Failure. While admitted pt developed R sided weakness, MRI demonstrated L frontal ischemia. Pt recently admitted 7/4 with bilateral pleural effusions. PMH includes: stage IV breast cancer (widely metastatic to lung, liver, spine, ribs, peritoneum) which she is going through treatment for currently, PAF not on AC, chronic HFpEF, HTN, CKD stage IIIb, anemia, HTN, recent admission 2/28 -3/04 for new onset PAF with RVR.   Assessment / Plan / Recommendation Clinical Impression  Pt appears  to be at her cognitive-linguistic baseline and has no subjective complaints of any acute changes. She scored WFL on all subtests administered from the [facility]. She also believes that she has a lot of support at home should she need anything. No further SLP f/u indicated at this time.     SLP Assessment  SLP Recommendation/Assessment: Patient does not need any further Speech Language Pathology Services SLP Visit Diagnosis: Cognitive communication deficit (R41.841)     Assistance Recommended at Discharge  PRN  Functional Status Assessment Patient has not had a recent decline in their functional status  Frequency and Duration           SLP  Evaluation Cognition  Overall Cognitive Status: Within Functional Limits for tasks assessed       Comprehension  Auditory Comprehension Overall Auditory Comprehension: Appears within functional limits for tasks assessed    Expression Expression Primary Mode of Expression: Verbal Verbal Expression Overall Verbal Expression: Appears within functional limits for tasks assessed   Oral / Motor  Motor Speech Overall Motor Speech: Appears within functional limits for tasks assessed            Leita SAILOR., M.A. CCC-SLP Acute Rehabilitation Services Office: [phone]  Secure chat preferred  04/24/2024, 2:56 PM

## 2024-04-24 NOTE — Evaluation (Signed)
 Occupational Therapy Evaluation Patient Details Name: [name] MRN: [id number] DOB: [birth year] Today's Date: 04/24/2024   History of Present Illness   The pt is an 89 yo female presenting to [facility] on 04/22/24 for SOB, bradycardia, and hyperkalemia. Further workup for Acute Renal Failure. While admitted pt developed R sided weakness, MRI demonstrated L frontal ischemia. Pt recently admitted 7/4 with bilateral pleural effusions. PMH includes: stage IV breast cancer (widely metastatic to lung, liver, spine, ribs, peritoneum) which she is going through treatment for currently, PAF not on AC, chronic HFpEF, HTN, CKD stage IIIb, anemia, HTN, recent admission 2/28 -3/04 for new onset PAF with RVR.     Clinical Impressions Pt admitted for above, PTA pt lived alone and reports being ind with her ADLs and family will assist with iADLs such as grocery shopping and housekeeping, ambulates with RW. Pt currently presenting with mild weakness of her RUE compared to her L, more so distally at the wrist with impaired FMC of RUE. Pt still functional and completing ADLs with CGA to setup A and ambulating with CGA + RW. No particular cognitive deficits noted this session. OT to continue following pt acutely to ensure safe transition to next level of care and discussed [facility] HEP if needed. Patient would benefit from post acute Home OT services to help maximize functional independence in natural environment      If plan is discharge home, recommend the following:   Assist for transportation;Assistance with cooking/housework     Functional Status Assessment   Patient has had a recent decline in their functional status and demonstrates the ability to make significant improvements in function in a reasonable and predictable amount of time.     Equipment Recommendations   Other (comment) (pt has rec DME.)     Recommendations for Other Services         Precautions/Restrictions    Precautions Precautions: Fall Recall of Precautions/Restrictions: Intact Precaution/Restrictions Comments: watch HR with exertion Restrictions Weight Bearing Restrictions Per Provider Order: No     Mobility Bed Mobility               General bed mobility comments: Pt in recliner on arrival    Transfers Overall transfer level: Needs assistance Equipment used: Rolling walker (2 wheels) Transfers: Sit to/from Stand Sit to Stand: Contact guard assist           General transfer comment: CGA for safety      Balance Overall balance assessment: Needs assistance Sitting-balance support: No upper extremity supported, Feet supported Sitting balance-Leahy Scale: Good     Standing balance support: Single extremity supported, During functional activity Standing balance-Leahy Scale: Fair Standing balance comment: better with RW for dynamic bal, static stand is steady.                           ADL either performed or assessed with clinical judgement   ADL Overall ADL's : Needs assistance/impaired Eating/Feeding: Independent;Sitting   Grooming: Standing;Wash/dry hands;Contact guard assist   Upper Body Bathing: Set up;Sitting   Lower Body Bathing: Set up;Sitting/lateral leans   Upper Body Dressing : Set up;Sitting   Lower Body Dressing: Contact guard assist;Sit to/from stand   Toilet Transfer: Contact guard assist;Rolling walker (2 wheels);Ambulation   Toileting- Clothing Manipulation and Hygiene: Contact guard assist;Sit to/from stand       Functional mobility during ADLs: Rolling walker (2 wheels);Contact guard assist       Vision Patient Visual  Report: No change from baseline Vision Assessment?: Yes Eye Alignment: Within Functional Limits Ocular Range of Motion: Within Functional Limits Alignment/Gaze Preference: Within Defined Limits Tracking/Visual Pursuits: Able to track stimulus in all quads without difficulty Convergence: Within functional  limits Visual Fields: No apparent deficits Diplopia Assessment:  (none)     Perception Perception: Within Functional Limits       Praxis Praxis: WFL       Pertinent Vitals/Pain Pain Assessment Pain Assessment: No/denies pain     Extremity/Trunk Assessment Upper Extremity Assessment Upper Extremity Assessment: RUE deficits/detail RUE Deficits / Details: mild coordination deficits. weaker than L but at least 3+/5 in wrist flex/ext and shoulder flexion as opposed to LUE being 4/5. RUE Sensation: WNL RUE Coordination: decreased fine motor   Lower Extremity Assessment Lower Extremity Assessment: Overall WFL for tasks assessed   Cervical / Trunk Assessment Cervical / Trunk Assessment: Kyphotic   Communication Communication Communication: No apparent difficulties   Cognition Arousal: Alert Behavior During Therapy: WFL for tasks assessed/performed Cognition: No apparent impairments                               Following commands: Intact       Cueing  General Comments   Cueing Techniques: Verbal cues  HR up to 145 bpm with activity OOB, in continuous Afib.  Sp02 96% RA   Exercises     Shoulder Instructions      Home Living Family/patient expects to be discharged to:: Private residence Living Arrangements: Alone Available Help at Discharge: Available PRN/intermittently;Family Type of Home: House Home Access: Stairs to enter [employer] of Steps: 3 Entrance Stairs-Rails: Right;Left Home Layout: Two level;1/2 bath on main level Alternate Level Stairs-Number of Steps: 14 with chair lift.   Bathroom Shower/Tub: Producer, television/film/video: Standard Bathroom Accessibility: Yes How Accessible: Accessible via walker Home Equipment: Grab bars - tub/shower;Rolling Walker (2 wheels);Shower seat   Additional Comments: Chair Lift to second floor with full bathroom and bedroom up.      Prior Functioning/Environment Prior Level of  Function : Driving;Independent/Modified Independent               ADLs Comments: daughter bought groceries    OT Problem List: Decreased coordination;Impaired balance (sitting and/or standing);Cardiopulmonary status limiting activity   OT Treatment/Interventions: Self-care/ADL training;Therapeutic exercise;Patient/family education;Balance training;Therapeutic activities;DME and/or AE instruction      OT Goals(Current goals can be found in the care plan section)   Acute Rehab OT Goals Patient Stated Goal: Hopeful to go home OT Goal Formulation: With patient Time For Goal Achievement: 05/08/24 Potential to Achieve Goals: Good   OT Frequency:  Min 2X/week    Co-evaluation              AM-PAC OT 6 Clicks Daily Activity     Outcome Measure Help from another person eating meals?: None Help from another person taking care of personal grooming?: A Little Help from another person toileting, which includes using toliet, bedpan, or urinal?: A Little Help from another person bathing (including washing, rinsing, drying)?: A Little Help from another person to put on and taking off regular upper body clothing?: A Little Help from another person to put on and taking off regular lower body clothing?: A Little 6 Click Score: 19   End of Session Equipment Utilized During Treatment: Gait belt;Rolling walker (2 wheels) Nurse Communication: Mobility status  Activity Tolerance: Patient tolerated treatment well  Patient left: in chair;with call bell/phone within reach;with chair alarm set  OT Visit Diagnosis: Unsteadiness on feet (R26.81);Other symptoms and signs involving the nervous system (R29.898);Muscle weakness (generalized) (M62.81)                Time: 8782-8761 OT Time Calculation (min): 21 min Charges:  OT General Charges $OT Visit: 1 Visit OT Evaluation $OT Eval Low Complexity: 1 Low  04/24/2024  AB, OTR/L  Acute Rehabilitation Services  Office: [phone]    [name] 04/24/2024, 2:30 PM

## 2024-04-24 NOTE — Plan of Care (Signed)

## 2024-04-24 NOTE — Progress Notes (Signed)
 [name] NEPHROLOGY PROGRESS NOTE  Assessment/ Plan: Pt is a 89 y.o. yo female with past medical history significant for metastatic breast and ovarian cancer, chronic diastolic CHF, CKD followed by me, CAD, anemia who was sent to the ER from nephrology clinic for shortness of breath, bradycardia/hypotension in the setting of hyperkalemia and CHF.  # Hyperkalemia severe with bradycardia: Discontinued potassium chloride , status post urgent dialysis on admission.  Potassium level improved.  Monitor lab.  # Acute kidney injury on CKD 3/4: Recent worsening creatinine level in the setting of CHF exacerbation/fluid overload.  Required dialysis for hyperkalemia.  Urine output is marginal, follow labs results from today.  Ordered dose of IV Lasix  and bladder scan.  No plan for HD today.  # Acute on chronic CHF with preserved EF: Pleural effusion and fluid overload on exam.  Echo with EF of 65 to 70%.  Getting echo.  Treating with IV diuretics.  # Anemia: Hemoglobin improving.  Monitor lab data and transfuse as needed.  # Metastatic breast and ovarian cancer.  Patient is DNR.  Following with oncologist.  I spoke with patient's daughter and updated.  # Acute ischemic stroke, embolic: Patient with history of A-fib, starting Eliquis  after consultation with cardiologist.  Seen by neurologist as well.  Discussed with primary team and updated her daughter on 7/18.  Subjective: Seen and examined.  Overnight events noted patient with new diagnosis of stroke.  Moved out of ICU to the floor.  Urine output is recorded only 100 cc.  No lab data available from morning.  Patient is alert awake but looks weak.  Objective Vital signs in last 24 hours: Vitals:   04/24/24 0000 04/24/24 0745 04/24/24 0800 04/24/24 0841  BP: (!) 167/72 (!) 194/83 (!) 162/85   Pulse: 96 (!) 129 70 (!) 122  Resp: 17 16 15    Temp: 98.2 F (36.8 C)  98.8 F (37.1 C)   TempSrc: Oral  Oral   SpO2: 94% 95% 97%     Weight change:   Intake/Output Summary (Last 24 hours) at 04/24/2024 1059 Last data filed at 04/23/2024 1739 Gross per 24 hour  Intake --  Output 100 ml  Net -100 ml       Labs: RENAL PANEL Recent Labs  Lab 04/22/24 1041 04/22/24 1131 04/22/24 1612 04/22/24 1624 04/22/24 1708 04/22/24 2046 04/23/24 0416  NA 137 138 135 135 136 138 136  K 6.5* 6.5* 7.4* 7.4* 6.6* 6.5* 3.8  CL 108 109 110 112* 113* 112* 98  CO2 15* 17* 12*  --   --  14* 25  GLUCOSE 131* 128* 259* 264* 291* 160* 102*  BUN 45* 44* 46* 43* 43* 46* 17  CREATININE 2.98* 3.04* 3.09* 3.30* 3.10* 3.02* 1.58*  CALCIUM  8.4* 8.7* 8.0*  --   --  8.8* 8.6*  MG 1.1*  --  1.1*  --   --   --  2.0  PHOS  --   --   --   --   --  6.0* 3.8  ALBUMIN  3.5  --  2.5*  --   --  2.5* 2.7*    Liver Function Tests: Recent Labs  Lab 04/22/24 1041 04/22/24 1612 04/22/24 2046 04/23/24 0416  AST 25 49*  --  116*  ALT 36 39  --  104*  ALKPHOS 137* 118  --  200*  BILITOT 0.4 0.6  --  0.7  PROT 6.2* 5.5*  --  5.8*  ALBUMIN  3.5 2.5* 2.5* 2.7*  No results for input(s): LIPASE, AMYLASE in the last 168 hours. Recent Labs  Lab 04/22/24 1947  AMMONIA 24   CBC: Recent Labs    12/24/23 0231 12/25/23 0312 04/22/24 1612 04/22/24 1624 04/22/24 1708 04/22/24 2046 04/23/24 0416  HGB 7.4*   < > 9.0* 9.2* 7.8* 8.8* 9.1*  MCV 104.0*   < > 96.8  --   --  96.0 89.1  VITAMINB12 1,125*  --   --   --   --   --   --   FOLATE 28.0  --   --   --   --   --   --   FERRITIN 323*  --   --   --   --   --   --   TIBC 225*  --   --   --   --   --   --   IRON 66  --   --   --   --   --   --    < > = values in this interval not displayed.    Cardiac Enzymes: No results for input(s): CKTOTAL, CKMB, CKMBINDEX, TROPONINI in the last 168 hours. CBG: Recent Labs  Lab 04/23/24 1546 04/23/24 2007 04/23/24 2354 04/24/24 0407 04/24/24 0826  GLUCAP 90 142* 102* 93 97    Iron Studies: No results for input(s): IRON, TIBC,  TRANSFERRIN, FERRITIN in the last 72 hours. Studies/Results: MR ANGIO HEAD WO CONTRAST Result Date: 04/24/2024 CLINICAL DATA:  89 year old female code stroke presentation, neurologic deficit. Small area of patchy left centrum semiovale ischemia on brain MRI last night. EXAM: MRA HEAD WITHOUT CONTRAST TECHNIQUE: Angiographic images of the Circle of Willis were acquired using MRA technique without intravenous contrast. COMPARISON:  Brain MRI 04/23/2024. FINDINGS: Anterior circulation: Antegrade flow in both ICA siphons. Left-side persistent trigeminal artery. No ICA siphon stenosis. Patent carotid termini. Dominant left and diminutive or absent right ACA A1 segment. Normal anterior communicating artery. Visible bilateral ACA branches are within normal limits. MCA M1 segments are tortuous, more so the left. There is mild to moderate left MCA M1 segment irregularity and stenosis on series 1020 image 12. MCA bifurcations are patent and bilateral MCA branches are within normal limits. Posterior circulation: Antegrade flow in the posterior circulation with dominant appearing distal right vertebral artery. Diminutive distal left vertebral artery appears to terminates in PICA. Right PICA appears patent. Patent basilar artery without stenosis. And furthermore there is a left-sided persistent trigeminal artery (series 5, image 108), also contributing to the distal basilar. Normal SCA and left PCA origins. Fetal type right PCA origin. Left posterior communicating artery diminutive or absent. Left PCA branches are within normal limits. There is mild to moderate irregularity and stenosis of the right PCA P2 segment broadly (series 1032, image 9). Distal right PCA branches remain patent. Anatomic variants: Persistent left side trigeminal artery. Dominant right vertebral, left vertebral terminates in PICA. Fetal right PCA origin. Dominant left and diminutive or absent right ACA A1 segments. Other: No intracranial mass effect  or ventriculomegaly. IMPRESSION: 1. Negative for large vessel occlusion. 2. Intracranial atherosclerosis, but mild for age overall. Mild to moderate irregularity and stenosis of the left MCA M1 and right PCA P2 segments. 3. Multiple vascular anatomic variations, including persistent Left side Trigeminal Artery. Electronically Signed   By: VEAR Hurst M.D.   On: 04/24/2024 07:07   MR BRAIN WO CONTRAST Result Date: 04/23/2024 EXAM: MRI BRAIN WITHOUT CONTRAST 04/23/2024 09:10:11 PM TECHNIQUE: Multiplanar multisequence MRI  of the head/brain was performed without the administration of intravenous contrast. COMPARISON: None available. CLINICAL HISTORY: Stroke, follow up. FINDINGS: BRAIN AND VENTRICLES: Small area of early subacute or acute ischemia within the posterior left frontal white matter. Bilateral early confluent hyperintense T2-weighted signal within the cerebral white matter. No intracranial hemorrhage. No mass. No midline shift. No hydrocephalus. The sella is unremarkable. Normal flow voids. ORBITS: No acute abnormality. SINUSES AND MASTOIDS: No acute abnormality. BONES AND SOFT TISSUES: Normal marrow signal. No acute soft tissue abnormality. IMPRESSION: 1. Small area of early subacute/acute ischemia within the posterior left frontal white matter. 2. Bilateral early confluent hyperintense T2-weighted signal within the cerebral white matter, most commonly due to chronic small vessel disease. Electronically signed by: Franky Stanford MD 04/23/2024 09:57 PM EDT RP Workstation: [id number]   ECHOCARDIOGRAM COMPLETE Result Date: 04/23/2024    ECHOCARDIOGRAM REPORT   Patient Name:   [name] Date of Exam: 04/23/2024 Medical Rec #:  [id number]        Height:       66.0 in Accession #:    [id number]       Weight:       112.9 lb Date of Birth:  09-08-1934       BSA:          1.568 m Patient Age:    89 years         BP:           172/65 mmHg Patient Gender: F                HR:           84 bpm. Exam Location:   Inpatient Procedure: 2D Echo, Cardiac Doppler and Color Doppler (Both Spectral and Color            Flow Doppler were utilized during procedure). Indications:    Acute Renal Failure  History:        Patient has prior history of Echocardiogram examinations. Renal                 Failure.  Sonographer:    Vella Key Referring Phys: [phone] MATTHEW R HUNSUCKER IMPRESSIONS  1. Left ventricular ejection fraction, by estimation, is 65 to 70%. The left ventricle has normal function. The left ventricle has no regional wall motion abnormalities. There is mild left ventricular hypertrophy. Left ventricular diastolic parameters were normal.  2. Right ventricular systolic function is normal. The right ventricular size is mildly enlarged.  3. Left atrial size was moderately dilated.  4. Mild mitral valve regurgitation.  5. The aortic valve is tricuspid. Aortic valve regurgitation is not visualized. Aortic valve sclerosis/calcification is present, without any evidence of aortic stenosis. FINDINGS  Left Ventricle: Left ventricular ejection fraction, by estimation, is 65 to 70%. The left ventricle has normal function. The left ventricle has no regional wall motion abnormalities. The left ventricular internal cavity size was small. There is mild left ventricular hypertrophy. Left ventricular diastolic parameters were normal. Right Ventricle: The right ventricular size is mildly enlarged. Right vetricular wall thickness was not assessed. Right ventricular systolic function is normal. Left Atrium: Left atrial size was moderately dilated. Right Atrium: Right atrial size was normal in size. Pericardium: There is no evidence of pericardial effusion. Mitral Valve: There is mild thickening of the mitral valve leaflet(s). Mild to moderate mitral annular calcification. Mild mitral valve regurgitation. Tricuspid Valve: The tricuspid valve is normal in structure. Tricuspid valve regurgitation is trivial. Aortic Valve: The  aortic valve is  tricuspid. Aortic valve regurgitation is not visualized. Aortic valve sclerosis/calcification is present, without any evidence of aortic stenosis. Pulmonic Valve: The pulmonic valve was normal in structure. Pulmonic valve regurgitation is trivial. Aorta: The aortic root and ascending aorta are structurally normal, with no evidence of dilitation. IAS/Shunts: No atrial level shunt detected by color flow Doppler.  LEFT VENTRICLE PLAX 2D LVIDd:         3.10 cm     Diastology LVIDs:         1.80 cm     LV e' medial:    12.80 cm/s LV PW:         1.30 cm     LV E/e' medial:  15.1 LV IVS:        1.10 cm     LV e' lateral:   13.50 cm/s LVOT diam:     1.70 cm     LV E/e' lateral: 14.3 LV SV:         70 LV SV Index:   45 LVOT Area:     2.27 cm  LV Volumes (MOD) LV vol d, MOD A2C: 58.4 ml LV vol d, MOD A4C: 83.9 ml LV vol s, MOD A2C: 15.4 ml LV vol s, MOD A4C: 32.8 ml LV SV MOD A2C:     43.0 ml LV SV MOD A4C:     83.9 ml LV SV MOD BP:      46.6 ml RIGHT VENTRICLE RV Basal diam:  3.90 cm RV S prime:     14.30 cm/s TAPSE (M-mode): 1.5 cm LEFT ATRIUM             Index        RIGHT ATRIUM           Index LA diam:        4.80 cm 3.06 cm/m   RA Area:     12.00 cm LA Vol (A2C):   68.5 ml 43.68 ml/m  RA Volume:   28.80 ml  18.36 ml/m LA Vol (A4C):   63.3 ml 40.36 ml/m LA Biplane Vol: 66.4 ml 42.34 ml/m  AORTIC VALVE LVOT Vmax:   135.00 cm/s LVOT Vmean:  105.000 cm/s LVOT VTI:    0.309 m  AORTA Ao Root diam: 2.80 cm Ao Asc diam:  2.80 cm MITRAL VALVE MV Area (PHT): 5.42 cm     SHUNTS MV Decel Time: 140 msec     Systemic VTI:  0.31 m MV E velocity: 193.00 cm/s  Systemic Diam: 1.70 cm MV A velocity: 64.50 cm/s MV E/A ratio:  2.99 Vina Gull MD Electronically signed by Vina Gull MD Signature Date/Time: 04/23/2024/3:01:59 PM    Final    CT HEAD CODE STROKE WO CONTRAST Result Date: 04/23/2024 CLINICAL DATA:  Code stroke. Neuro deficit, acute, stroke suspected. Right arm weakness. EXAM: CT HEAD WITHOUT CONTRAST TECHNIQUE:  Contiguous axial images were obtained from the base of the skull through the vertex without intravenous contrast. RADIATION DOSE REDUCTION: This exam was performed according to the departmental dose-optimization program which includes automated exposure control, adjustment of the mA and/or kV according to patient size and/or use of iterative reconstruction technique. COMPARISON:  Head CT 04/21/2008. FINDINGS: Brain: Generalized cerebral atrophy. Patchy and ill-defined hypoattenuation within the cerebral white matter, nonspecific but compatible with moderate to advanced chronic small vessel ischemic disease. There is no acute intracranial hemorrhage. No demarcated cortical infarct. No extra-axial fluid collection. No evidence of an intracranial mass. No midline shift.  Vascular: No hyperdense vessel.  Atherosclerotic calcifications. Skull: No calvarial fracture or aggressive osseous lesion. Sinuses/Orbits: No mass or acute finding within the imaged orbits. No significant paranasal sinus disease. ASPECTS [facility] Stroke Program Early CT Score) - Ganglionic level infarction (caudate, lentiform nuclei, internal capsule, insula, M1-M3 cortex): 7 - Supraganglionic infarction (M4-M6 cortex): 3 Total score (0-10 with 10 being normal): 10 No evidence of an acute intracranial abnormality. These results were communicated to Dr. Matthews At 1:39 pmon 7/18/2025by text page via the [facility] messaging system. IMPRESSION: 1. No evidence of an acute intracranial abnormality. 2. Parenchymal atrophy and chronic small vessel ischemic disease, progressed from the prior head CT of 04/21/2008. Electronically Signed   By: Rockey Childs D.O.   On: 04/23/2024 13:39   CT CHEST WO CONTRAST Result Date: 04/22/2024 CLINICAL DATA:  History of lung carcinoma with metastatic disease, shortness of breath EXAM: CT CHEST WITHOUT CONTRAST TECHNIQUE: Multidetector CT imaging of the chest was performed following the standard protocol without IV contrast.  RADIATION DOSE REDUCTION: This exam was performed according to the departmental dose-optimization program which includes automated exposure control, adjustment of the mA and/or kV according to patient size and/or use of iterative reconstruction technique. COMPARISON:  04/09/2024 FINDINGS: Cardiovascular: Heart is stable in size. Heavy coronary calcifications are noted. Atherosclerotic calcifications of the aorta are noted without aneurysmal dilatation. Mediastinum/Nodes: Thoracic inlet shows evidence of a left jugular dialysis catheter the esophagus as visualized is within normal limits. No hilar or mediastinal adenopathy is noted. Lungs/Pleura: Lungs again demonstrate bilateral lower lobe infiltrates with associated effusions right greater than left. These changes have progressed in the interval from the prior exam. Scattered small nodules are again seen to include a 3 mm nodule in the left lower lobe on image number 81 of series 4. New patchy somewhat nodular density is noted in the left lower lobe on image number 58 of series 4 measuring up to 10 mm in average diameter. Given its abrupt onset and adjacent progressive infiltrate, this is felt to be postinflammatory in nature. Previously seen right lower lobe nodule is now enveloped within the increasing basilar infiltrate. No other sizable nodules are seen. Upper Abdomen: As described on concomitant CT of the abdomen and pelvis. Musculoskeletal: Old healed rib fractures are seen. Heel metastatic changes are noted involving the vertebral bodies from T8-T12. T11 vertebral plana with increased kyphosis is seen. Previously seen nodule in the region of the left breast is again identified and stable. Again possibility of neoplastic lesion deserves consideration. IMPRESSION: Progressive bilateral lower lobe infiltrate with associated effusions. Stable parenchymal nodules when compared with the prior exam. Healed metastatic disease Aortic Atherosclerosis (ICD10-I70.0) and  Emphysema (ICD10-J43.9). Electronically Signed   By: Oneil Devonshire M.D.   On: 04/22/2024 19:29   CT RENAL STONE STUDY Result Date: 04/22/2024 CLINICAL DATA:  Flank pain EXAM: CT ABDOMEN AND PELVIS WITHOUT CONTRAST TECHNIQUE: Multidetector CT imaging of the abdomen and pelvis was performed following the standard protocol without IV contrast. RADIATION DOSE REDUCTION: This exam was performed according to the departmental dose-optimization program which includes automated exposure control, adjustment of the mA and/or kV according to patient size and/or use of iterative reconstruction technique. COMPARISON:  03/25/2024 FINDINGS: Lower chest: Bilateral lower lobe consolidation and effusions are seen. This is worse on the right than the left. Hepatobiliary: Known hepatic lesions are not well visualized on this noncontrast study. Status post cholecystectomy. No biliary dilatation. Pancreas: Unremarkable. No pancreatic ductal dilatation or surrounding inflammatory changes. Spleen: Normal in  size without focal abnormality. Adrenals/Urinary Tract: Left adrenal gland is within normal limits. Right adrenal nodule is again noted and stable. No follow-up is recommended. Kidneys are well visualized bilaterally. Bilateral nonobstructing renal calculi are noted. The largest of these on the left measures 8 mm. Largest on the right measures approximately 3 mm. The ureters are within normal limits. The bladder is decompressed. Stomach/Bowel: Fecal material is noted scattered throughout the colon. No obstructive changes are seen. The appendix has been surgically removed. Small bowel and stomach are within normal limits. No obstructive changes are seen. Vascular/Lymphatic: Aortic atherosclerosis. No enlarged abdominal or pelvic lymph nodes. Reproductive: Status post hysterectomy. No adnexal masses. Other: No abdominal wall hernia or abnormality. No abdominopelvic ascites. Musculoskeletal: Vertebral plana is noted at T11. Increased  sclerosis is noted in T10 and T12 consistent with prior metastatic disease. Anterolisthesis of L5 on S1 is noted. Metastatic disease in the L4 spinous process is noted as well. IMPRESSION: Bilateral lower lobe consolidation and effusions right greater than left. Bilateral nonobstructing renal calculi as described. Scattered fecal material without obstructive change. Healed metastatic bony lesions Known hepatic metastatic disease is not well appreciated on this noncontrast study. Electronically Signed   By: Oneil Devonshire M.D.   On: 04/22/2024 19:12    Medications: Infusions:  anticoagulant sodium citrate       Scheduled Medications:   stroke: early stages of recovery book   Does not apply Once   apixaban   2.5 mg Oral BID   Chlorhexidine  Gluconate Cloth  6 each Topical Q0600   diltiazem   30 mg Oral Q6H   insulin  aspart  0-9 Units Subcutaneous Q4H   isosorbide  mononitrate  60 mg Oral Daily   metoprolol  tartrate  50 mg Oral BID   sodium chloride  flush  10-40 mL Intracatheter Q12H    have reviewed scheduled and prn medications.  Physical Exam: General: Elderly frail and ill looking female Heart:RRR, s1s2 nl Lungs: Bibasilar reduced breath sound. Abdomen:soft, Non-tender, non-distended Extremities: Bilateral leg pitting edema++ Neurology: Alert, awake and following commands  Tvisha Schwoerer Prasad Doron Shake 04/24/2024,10:59 AM  LOS: 2 days

## 2024-04-24 NOTE — Evaluation (Signed)
 Physical Therapy Evaluation Patient Details Name: [name] MRN: [id number] DOB: [birth year] Today's Date: 04/24/2024  History of Present Illness  The pt is an 89 yo female presenting to [facility] on 04/22/24 for SOB, bradycardia, and hyperkalemia. Further workup for Acute Renal Failure. While admitted pt developed R sided weakness, MRI demonstrated L frontal ischemia. Pt recently admitted 7/4 with bilateral pleural effusions. PMH includes: stage IV breast cancer (widely metastatic to lung, liver, spine, ribs, peritoneum) which she is going through treatment for currently, PAF not on AC, chronic HFpEF, HTN, CKD stage IIIb, anemia, HTN, recent admission 2/28 -3/04 for new onset PAF with RVR.  Clinical Impression  Patient presents with mild dependencies in gait and transfers limited by cardiopulm status and decreased balance.  Anticipate patient will make steady gains and be able to discharge home as planned.  She may need some assistance for a couple of days when she transitions home.  Patient will benefit from continued PT.          If plan is discharge home, recommend the following: A little help with walking and/or transfers;A little help with bathing/dressing/bathroom;Assistance with cooking/housework;Assist for transportation;Help with stairs or ramp for entrance   Can travel by private vehicle        Equipment Recommendations None recommended by PT  Recommendations for Other Services       Functional Status Assessment Patient has had a recent decline in their functional status and demonstrates the ability to make significant improvements in function in a reasonable and predictable amount of time.     Precautions / Restrictions Precautions Precautions: Fall Recall of Precautions/Restrictions: Intact Precaution/Restrictions Comments: watch HR with exertion Restrictions Weight Bearing Restrictions Per Provider Order: No      Mobility  Bed Mobility Overal bed mobility: Modified  Independent Bed Mobility: Supine to Sit           General bed mobility comments: increased time, no assist given    Transfers Overall transfer level: Needs assistance Equipment used: 1 person hand held assist Transfers: Sit to/from Stand, Bed to chair/wheelchair/BSC Sit to Stand: Min assist   Step pivot transfers: Min assist       General transfer comment: pt reaching for UE support on furniture    Ambulation/Gait Ambulation/Gait assistance: Min assist Gait Distance (Feet): 8 Feet Assistive device: 1 person hand held assist Gait Pattern/deviations: Step-through pattern, Decreased stride length, Shuffle, Narrow base of support Gait velocity: decreased     General Gait Details: session limited by increased HR  Stairs            Wheelchair Mobility     Tilt Bed    Modified Rankin (Stroke Patients Only) Modified Rankin (Stroke Patients Only) Pre-Morbid Rankin Score: Slight disability (recent d/c from hospital) Modified Rankin: Moderately severe disability     Balance Overall balance assessment: Needs assistance Sitting-balance support: No upper extremity supported, Feet supported Sitting balance-Leahy Scale: Good     Standing balance support: Single extremity supported, During functional activity Standing balance-Leahy Scale: Poor Standing balance comment: reaching for support from furniture, etc.  would benefit from RW for improved independence.                             Pertinent Vitals/Pain Pain Assessment Pain Assessment: No/denies pain    Home Living Family/patient expects to be discharged to:: Private residence Living Arrangements: Alone Available Help at Discharge: Available PRN/intermittently;Family Type of Home: House Home Access: Stairs  to enter Entrance Stairs-Rails: Right;Left Entrance Stairs-Number of Steps: 3 Alternate Level Stairs-Number of Steps: 14 with chair lift. Home Layout: Two level;1/2 bath on main  level Home Equipment: Grab bars - tub/shower;Rolling Walker (2 wheels) Additional Comments: Chair Lift to second floor with full bathroom and bedroom up.    Prior Function Prior Level of Function : Driving;Independent/Modified Independent                     Extremity/Trunk Assessment   Upper Extremity Assessment Upper Extremity Assessment: Defer to OT evaluation    Lower Extremity Assessment Lower Extremity Assessment: Overall WFL for tasks assessed    Cervical / Trunk Assessment Cervical / Trunk Assessment: Kyphotic  Communication   Communication Communication: No apparent difficulties    Cognition Arousal: Alert Behavior During Therapy: WFL for tasks assessed/performed   PT - Cognitive impairments: No apparent impairments                         Following commands: Intact       Cueing       General Comments      Exercises     Assessment/Plan    PT Assessment Patient needs continued PT services  PT Problem List Decreased activity tolerance;Decreased balance;Decreased mobility;Cardiopulmonary status limiting activity       PT Treatment Interventions DME instruction;Gait training;Stair training;Functional mobility training;Therapeutic activities;Therapeutic exercise;Balance training;Patient/family education    PT Goals (Current goals can be found in the Care Plan section)  Acute Rehab PT Goals Patient Stated Goal: return home PT Goal Formulation: With patient Time For Goal Achievement: 05/01/24    Frequency Min 3X/week     Co-evaluation               AM-PAC PT 6 Clicks Mobility  Outcome Measure Help needed turning from your back to your side while in a flat bed without using bedrails?: None Help needed moving from lying on your back to sitting on the side of a flat bed without using bedrails?: None Help needed moving to and from a bed to a chair (including a wheelchair)?: A Little Help needed standing up from a chair using  your arms (e.g., wheelchair or bedside chair)?: A Little Help needed to walk in hospital room?: A Little Help needed climbing 3-5 steps with a railing? : A Little 6 Click Score: 20    End of Session Equipment Utilized During Treatment: Gait belt Activity Tolerance: Patient tolerated treatment well;Treatment limited secondary to medical complications (Comment) Patient left: in chair;with call bell/phone within reach;with chair alarm set Nurse Communication: Mobility status PT Visit Diagnosis: Unsteadiness on feet (R26.81);Other abnormalities of gait and mobility (R26.89);Other symptoms and signs involving the nervous system (R29.898)    Time: 8877-8855 PT Time Calculation (min) (ACUTE ONLY): 22 min   Charges:   PT Evaluation $PT Eval Moderate Complexity: 1 Mod   PT General Charges $$ ACUTE PT VISIT: 1 Visit         04/24/2024 Lebron, PT Acute Rehabilitation Services Office:  [phone]   Katharina Venus HERO 04/24/2024, 12:04 PM

## 2024-04-24 NOTE — Discharge Instructions (Signed)

## 2024-04-24 NOTE — Progress Notes (Addendum)
 PROGRESS NOTE        PATIENT DETAILS Name: [name] Age: 89 y.o. Sex: female Date of Birth: August 20, 1934 Admit Date: 04/22/2024 Admitting Physician Donnice JONELLE Beals, MD ERE:Cjmjijmjgjw, [name], MD  Brief Summary: Patient is a 89 y.o.  female with history of metastatic breast cancer, HTN, CKD stage IIIb, chronic HFrEF-who was sent from nephrology clinic for evaluation of hyperkalemia and AKI.  Significant events: 7/17>> admit to ICU-severe hyperkalemia-underwent emergent HD. 7/18>> RUE weakness-code stroke-MRI brain positive for acute CVA 7/19>> transferred to [facility]  Significant studies: 7/17>> CT renal stone study: No hydronephrosis, B/L lower lobe consolidation/effusion. 7/17>> CT chest: Progressive B/L lower lobe infiltrate with associated effusions. 7/18>> MRI brain: Acute infarct posterior left frontal white matter. 7/18>> TTE: EF 65-70% 7/18>> A1c: 6.1 7/19>> MRA head: No LVO, intracranial atherosclerosis-mild stenosis of left MCA M1, right PCA P1 segments.  Significant microbiology data: None  Procedures: None  Consults: PCCM Neurology Neurology  Subjective: Lying comfortably in bed-denies any chest pain or shortness of breath.  Claims right upper extremity is better today than yesterday.  Objective: Vitals: Blood pressure (!) 162/85, pulse (!) 122, temperature 98.8 F (37.1 C), temperature source Oral, resp. rate 15, last menstrual period 10/07/1974, SpO2 97%.   Exam: Gen Exam:Alert awake-not in any distress HEENT:atraumatic, normocephalic Chest: B/L clear to auscultation anteriorly CVS:S1S2 regular Abdomen:soft non tender, non distended Extremities:no edema Neurology: Minimal weakness in the right upper extremity-mostly in the distal aspect. Skin: no rash  Pertinent Labs/Radiology:    Latest Ref Rng & Units 04/23/2024    4:16 AM 04/22/2024    8:46 PM 04/22/2024    5:08 PM  CBC  WBC 4.0 - 10.5 K/uL 12.1  16.1     Hemoglobin 12.0 - 15.0 g/dL 9.1  8.8  7.8   Hematocrit 36.0 - 46.0 % 27.8  28.7  23.0   Platelets 150 - 400 K/uL 209  239      Lab Results  Component Value Date   NA 136 04/23/2024   K 3.8 04/23/2024   CL 98 04/23/2024   CO2 25 04/23/2024     Assessment/Plan: Hyperkalemia Likely secondary to worsening renal function. Required emergent dialysis on initial presentation Now resolved  AKI on CKD stage IIIb Unclear etiology of AKI but suspicion for hemodynamic mediated kidney injury Renal function has improved with supportive care-continue to follow closely. Nephrology following  Acute on chronic HFpEF Volume status improving with HD and furosemide  Diuretics defer to nephrology service  Acute CVA Likely embolic Workup in progress Stroke team following See below regarding anticoagulation.  Persistent A-fib with RVR RVR this morning IV metoprolol  5 mg x 1 ordered Will restart oral Cardizem  and metoprolol  Will discuss with patient's primary cardiologist-stroke team-start anticoagulation accordingly  Addendum Discussed with both neurology-and patient's primary cardiologist-Dr. Noemi spoke with patient's daughter Carolyn-risk/benefits of anticoagulation discussed-current consensus is to start anticoagulation and follow closely.  Previously-anticoagulation was considered but held due to severity of anemia/fall risk.  Daughter/patient aware of bleeding risk   Normocytic anemia Likely due to combination of CKD/malignancy Follow Hb  Transaminitis Mild Unclear etiology Repeat labs-initiate workup with pulmonary persistent.  B/L pleural effusions Probably transudative in etiology in the setting of CHF/worsening renal function Diuresis-repeat imaging in a few weeks Currently these effusions do not appear to be symptomatic.  HTN Allowing some amount of permissive hypertension  However in A-fib RVR Hence plans to start beta-blocker/Cardizem .  History of  breast cancer with metastatic disease Prior history of ovarian cancer Continue outpatient follow-up with oncology.   Code status:   Code Status: Limited: Do not attempt resuscitation (DNR) -DNR-LIMITED -Do Not Intubate/DNI    DVT Prophylaxis: heparin  injection 5,000 Units Start: 04/22/24 2200   Family Communication: Daughter-Carolyn [phone] updated over the phone 7/19   Disposition Plan: Status is: Inpatient Remains inpatient appropriate because: Severity of illness   Planned Discharge Destination:Home health   Diet: Diet Order             Diet regular Room service appropriate? Yes; Fluid consistency: Thin  Diet effective now                     Antimicrobial agents: Anti-infectives (From admission, onward)    None        MEDICATIONS: Scheduled Meds:   stroke: early stages of recovery book   Does not apply Once   Chlorhexidine  Gluconate Cloth  6 each Topical Q0600   heparin  injection (subcutaneous)  5,000 Units Subcutaneous Q8H   insulin  aspart  0-9 Units Subcutaneous Q4H   isosorbide  mononitrate  60 mg Oral Daily   metoprolol  tartrate  50 mg Oral BID   sodium chloride  flush  10-40 mL Intracatheter Q12H   Continuous Infusions:  anticoagulant sodium citrate      PRN Meds:.alteplase , anticoagulant sodium citrate , docusate sodium , heparin , ipratropium-albuterol , lidocaine  (PF), lidocaine -prilocaine , metoprolol  tartrate, ondansetron  (ZOFRAN ) IV, oxyCODONE , pentafluoroprop-tetrafluoroeth, polyethylene glycol, sodium chloride  flush   I have personally reviewed following labs and imaging studies  LABORATORY DATA: CBC: Recent Labs  Lab 04/22/24 1041 04/22/24 1612 04/22/24 1624 04/22/24 1708 04/22/24 2046 04/23/24 0416  WBC 11.3* 12.6*  --   --  16.1* 12.1*  NEUTROABS 9.3* 9.6*  --   --   --   --   HGB 9.2* 9.0* 9.2* 7.8* 8.8* 9.1*  HCT 29.5* 29.8* 27.0* 23.0* 28.7* 27.8*  MCV 93.7 96.8  --   --  96.0 89.1  PLT 228 259  --   --  239 209     Basic Metabolic Panel: Recent Labs  Lab 04/22/24 1041 04/22/24 1131 04/22/24 1612 04/22/24 1624 04/22/24 1708 04/22/24 2046 04/23/24 0416  NA 137 138 135 135 136 138 136  K 6.5* 6.5* 7.4* 7.4* 6.6* 6.5* 3.8  CL 108 109 110 112* 113* 112* 98  CO2 15* 17* 12*  --   --  14* 25  GLUCOSE 131* 128* 259* 264* 291* 160* 102*  BUN 45* 44* 46* 43* 43* 46* 17  CREATININE 2.98* 3.04* 3.09* 3.30* 3.10* 3.02* 1.58*  CALCIUM  8.4* 8.7* 8.0*  --   --  8.8* 8.6*  MG 1.1*  --  1.1*  --   --   --  2.0  PHOS  --   --   --   --   --  6.0* 3.8    GFR: Estimated Creatinine Clearance: 20.3 mL/min (A) (by C-G formula based on SCr of 1.58 mg/dL (H)).  Liver Function Tests: Recent Labs  Lab 04/22/24 1041 04/22/24 1612 04/22/24 2046 04/23/24 0416  AST 25 49*  --  116*  ALT 36 39  --  104*  ALKPHOS 137* 118  --  200*  BILITOT 0.4 0.6  --  0.7  PROT 6.2* 5.5*  --  5.8*  ALBUMIN  3.5 2.5* 2.5* 2.7*   No results for input(s): LIPASE, AMYLASE in the  last 168 hours. Recent Labs  Lab 04/22/24 1947  AMMONIA 24    Coagulation Profile: Recent Labs  Lab 04/23/24 0416  INR 1.3*    Cardiac Enzymes: No results for input(s): CKTOTAL, CKMB, CKMBINDEX, TROPONINI in the last 168 hours.  BNP (last 3 results) No results for input(s): PROBNP in the last 8760 hours.  Lipid Profile: No results for input(s): CHOL, HDL, LDLCALC, TRIG, CHOLHDL, LDLDIRECT in the last 72 hours.  Thyroid  Function Tests: Recent Labs    04/22/24 1612  TSH 18.047*    Anemia Panel: No results for input(s): VITAMINB12, FOLATE, FERRITIN, TIBC, IRON, RETICCTPCT in the last 72 hours.  Urine analysis:    Component Value Date/Time   COLORURINE YELLOW 11/21/2023 1840   APPEARANCEUR CLEAR 11/21/2023 1840   LABSPEC 1.016 11/21/2023 1840   PHURINE 5.0 11/21/2023 1840   GLUCOSEU NEGATIVE 11/21/2023 1840   HGBUR NEGATIVE 11/21/2023 1840   BILIRUBINUR NEGATIVE 11/21/2023 1840    BILIRUBINUR n 09/15/2015 1123   KETONESUR NEGATIVE 11/21/2023 1840   PROTEINUR NEGATIVE 11/21/2023 1840   UROBILINOGEN negative 09/15/2015 1123   UROBILINOGEN 0.2 04/11/2009 1015   NITRITE NEGATIVE 11/21/2023 1840   LEUKOCYTESUR NEGATIVE 11/21/2023 1840    Sepsis Labs: Lactic Acid, Venous    Component Value Date/Time   LATICACIDVEN 1.5 04/09/2024 1342    MICROBIOLOGY: Recent Results (from the past 240 hours)  MRSA Next Gen by PCR, Nasal     Status: None   Collection Time: 04/22/24 11:06 PM   Specimen: Nasal Mucosa; Nasal Swab  Result Value Ref Range Status   MRSA by PCR Next Gen NOT DETECTED NOT DETECTED Final    Comment: (NOTE) The GeneXpert MRSA Assay (FDA approved for NASAL specimens only), is one component of a comprehensive MRSA colonization surveillance program. It is not intended to diagnose MRSA infection nor to guide or monitor treatment for MRSA infections. Test performance is not FDA approved in patients less than 58 years old. Performed at [facility], [address] [place], [place] [address]     RADIOLOGY STUDIES/RESULTS: MR ANGIO HEAD WO CONTRAST Result Date: 04/24/2024 CLINICAL DATA:  89 year old female code stroke presentation, neurologic deficit. Small area of patchy left centrum semiovale ischemia on brain MRI last night. EXAM: MRA HEAD WITHOUT CONTRAST TECHNIQUE: Angiographic images of the Circle of Willis were acquired using MRA technique without intravenous contrast. COMPARISON:  Brain MRI 04/23/2024. FINDINGS: Anterior circulation: Antegrade flow in both ICA siphons. Left-side persistent trigeminal artery. No ICA siphon stenosis. Patent carotid termini. Dominant left and diminutive or absent right ACA A1 segment. Normal anterior communicating artery. Visible bilateral ACA branches are within normal limits. MCA M1 segments are tortuous, more so the left. There is mild to moderate left MCA M1 segment irregularity and stenosis on series 1020 image 12.  MCA bifurcations are patent and bilateral MCA branches are within normal limits. Posterior circulation: Antegrade flow in the posterior circulation with dominant appearing distal right vertebral artery. Diminutive distal left vertebral artery appears to terminates in PICA. Right PICA appears patent. Patent basilar artery without stenosis. And furthermore there is a left-sided persistent trigeminal artery (series 5, image 108), also contributing to the distal basilar. Normal SCA and left PCA origins. Fetal type right PCA origin. Left posterior communicating artery diminutive or absent. Left PCA branches are within normal limits. There is mild to moderate irregularity and stenosis of the right PCA P2 segment broadly (series 1032, image 9). Distal right PCA branches remain patent. Anatomic variants: Persistent left  side trigeminal artery. Dominant right vertebral, left vertebral terminates in PICA. Fetal right PCA origin. Dominant left and diminutive or absent right ACA A1 segments. Other: No intracranial mass effect or ventriculomegaly. IMPRESSION: 1. Negative for large vessel occlusion. 2. Intracranial atherosclerosis, but mild for age overall. Mild to moderate irregularity and stenosis of the left MCA M1 and right PCA P2 segments. 3. Multiple vascular anatomic variations, including persistent Left side Trigeminal Artery. Electronically Signed   By: VEAR Hurst M.D.   On: 04/24/2024 07:07   MR BRAIN WO CONTRAST Result Date: 04/23/2024 EXAM: MRI BRAIN WITHOUT CONTRAST 04/23/2024 09:10:11 PM TECHNIQUE: Multiplanar multisequence MRI of the head/brain was performed without the administration of intravenous contrast. COMPARISON: None available. CLINICAL HISTORY: Stroke, follow up. FINDINGS: BRAIN AND VENTRICLES: Small area of early subacute or acute ischemia within the posterior left frontal white matter. Bilateral early confluent hyperintense T2-weighted signal within the cerebral white matter. No intracranial hemorrhage.  No mass. No midline shift. No hydrocephalus. The sella is unremarkable. Normal flow voids. ORBITS: No acute abnormality. SINUSES AND MASTOIDS: No acute abnormality. BONES AND SOFT TISSUES: Normal marrow signal. No acute soft tissue abnormality. IMPRESSION: 1. Small area of early subacute/acute ischemia within the posterior left frontal white matter. 2. Bilateral early confluent hyperintense T2-weighted signal within the cerebral white matter, most commonly due to chronic small vessel disease. Electronically signed by: Franky Stanford MD 04/23/2024 09:57 PM EDT RP Workstation: [id number]   ECHOCARDIOGRAM COMPLETE Result Date: 04/23/2024    ECHOCARDIOGRAM REPORT   Patient Name:   [name] Date of Exam: 04/23/2024 Medical Rec #:  [id number]        Height:       66.0 in Accession #:    [id number]       Weight:       112.9 lb Date of Birth:  [birth year]       BSA:          1.568 m Patient Age:    89 years         BP:           172/65 mmHg Patient Gender: F                HR:           84 bpm. Exam Location:  Inpatient Procedure: 2D Echo, Cardiac Doppler and Color Doppler (Both Spectral and Color            Flow Doppler were utilized during procedure). Indications:    Acute Renal Failure  History:        Patient has prior history of Echocardiogram examinations. Renal                 Failure.  Sonographer:    Vella Key Referring Phys: [phone] MATTHEW R HUNSUCKER IMPRESSIONS  1. Left ventricular ejection fraction, by estimation, is 65 to 70%. The left ventricle has normal function. The left ventricle has no regional wall motion abnormalities. There is mild left ventricular hypertrophy. Left ventricular diastolic parameters were normal.  2. Right ventricular systolic function is normal. The right ventricular size is mildly enlarged.  3. Left atrial size was moderately dilated.  4. Mild mitral valve regurgitation.  5. The aortic valve is tricuspid. Aortic valve regurgitation is not visualized. Aortic valve  sclerosis/calcification is present, without any evidence of aortic stenosis. FINDINGS  Left Ventricle: Left ventricular ejection fraction, by estimation, is 65 to 70%. The left ventricle has normal function. The left ventricle has  no regional wall motion abnormalities. The left ventricular internal cavity size was small. There is mild left ventricular hypertrophy. Left ventricular diastolic parameters were normal. Right Ventricle: The right ventricular size is mildly enlarged. Right vetricular wall thickness was not assessed. Right ventricular systolic function is normal. Left Atrium: Left atrial size was moderately dilated. Right Atrium: Right atrial size was normal in size. Pericardium: There is no evidence of pericardial effusion. Mitral Valve: There is mild thickening of the mitral valve leaflet(s). Mild to moderate mitral annular calcification. Mild mitral valve regurgitation. Tricuspid Valve: The tricuspid valve is normal in structure. Tricuspid valve regurgitation is trivial. Aortic Valve: The aortic valve is tricuspid. Aortic valve regurgitation is not visualized. Aortic valve sclerosis/calcification is present, without any evidence of aortic stenosis. Pulmonic Valve: The pulmonic valve was normal in structure. Pulmonic valve regurgitation is trivial. Aorta: The aortic root and ascending aorta are structurally normal, with no evidence of dilitation. IAS/Shunts: No atrial level shunt detected by color flow Doppler.  LEFT VENTRICLE PLAX 2D LVIDd:         3.10 cm     Diastology LVIDs:         1.80 cm     LV e' medial:    12.80 cm/s LV PW:         1.30 cm     LV E/e' medial:  15.1 LV IVS:        1.10 cm     LV e' lateral:   13.50 cm/s LVOT diam:     1.70 cm     LV E/e' lateral: 14.3 LV SV:         70 LV SV Index:   45 LVOT Area:     2.27 cm  LV Volumes (MOD) LV vol d, MOD A2C: 58.4 ml LV vol d, MOD A4C: 83.9 ml LV vol s, MOD A2C: 15.4 ml LV vol s, MOD A4C: 32.8 ml LV SV MOD A2C:     43.0 ml LV SV MOD A4C:      83.9 ml LV SV MOD BP:      46.6 ml RIGHT VENTRICLE RV Basal diam:  3.90 cm RV S prime:     14.30 cm/s TAPSE (M-mode): 1.5 cm LEFT ATRIUM             Index        RIGHT ATRIUM           Index LA diam:        4.80 cm 3.06 cm/m   RA Area:     12.00 cm LA Vol (A2C):   68.5 ml 43.68 ml/m  RA Volume:   28.80 ml  18.36 ml/m LA Vol (A4C):   63.3 ml 40.36 ml/m LA Biplane Vol: 66.4 ml 42.34 ml/m  AORTIC VALVE LVOT Vmax:   135.00 cm/s LVOT Vmean:  105.000 cm/s LVOT VTI:    0.309 m  AORTA Ao Root diam: 2.80 cm Ao Asc diam:  2.80 cm MITRAL VALVE MV Area (PHT): 5.42 cm     SHUNTS MV Decel Time: 140 msec     Systemic VTI:  0.31 m MV E velocity: 193.00 cm/s  Systemic Diam: 1.70 cm MV A velocity: 64.50 cm/s MV E/A ratio:  2.99 Vina Gull MD Electronically signed by Vina Gull MD Signature Date/Time: 04/23/2024/3:01:59 PM    Final    CT HEAD CODE STROKE WO CONTRAST Result Date: 04/23/2024 CLINICAL DATA:  Code stroke. Neuro deficit, acute, stroke suspected. Right arm weakness. EXAM: CT  HEAD WITHOUT CONTRAST TECHNIQUE: Contiguous axial images were obtained from the base of the skull through the vertex without intravenous contrast. RADIATION DOSE REDUCTION: This exam was performed according to the departmental dose-optimization program which includes automated exposure control, adjustment of the mA and/or kV according to patient size and/or use of iterative reconstruction technique. COMPARISON:  Head CT 04/21/2008. FINDINGS: Brain: Generalized cerebral atrophy. Patchy and ill-defined hypoattenuation within the cerebral white matter, nonspecific but compatible with moderate to advanced chronic small vessel ischemic disease. There is no acute intracranial hemorrhage. No demarcated cortical infarct. No extra-axial fluid collection. No evidence of an intracranial mass. No midline shift. Vascular: No hyperdense vessel.  Atherosclerotic calcifications. Skull: No calvarial fracture or aggressive osseous lesion. Sinuses/Orbits: No mass  or acute finding within the imaged orbits. No significant paranasal sinus disease. ASPECTS [facility] Stroke Program Early CT Score) - Ganglionic level infarction (caudate, lentiform nuclei, internal capsule, insula, M1-M3 cortex): 7 - Supraganglionic infarction (M4-M6 cortex): 3 Total score (0-10 with 10 being normal): 10 No evidence of an acute intracranial abnormality. These results were communicated to Dr. Matthews At 1:39 pmon 7/18/2025by text page via the [facility] messaging system. IMPRESSION: 1. No evidence of an acute intracranial abnormality. 2. Parenchymal atrophy and chronic small vessel ischemic disease, progressed from the prior head CT of 04/21/2008. Electronically Signed   By: Rockey Childs D.O.   On: 04/23/2024 13:39   CT CHEST WO CONTRAST Result Date: 04/22/2024 CLINICAL DATA:  History of lung carcinoma with metastatic disease, shortness of breath EXAM: CT CHEST WITHOUT CONTRAST TECHNIQUE: Multidetector CT imaging of the chest was performed following the standard protocol without IV contrast. RADIATION DOSE REDUCTION: This exam was performed according to the departmental dose-optimization program which includes automated exposure control, adjustment of the mA and/or kV according to patient size and/or use of iterative reconstruction technique. COMPARISON:  04/09/2024 FINDINGS: Cardiovascular: Heart is stable in size. Heavy coronary calcifications are noted. Atherosclerotic calcifications of the aorta are noted without aneurysmal dilatation. Mediastinum/Nodes: Thoracic inlet shows evidence of a left jugular dialysis catheter the esophagus as visualized is within normal limits. No hilar or mediastinal adenopathy is noted. Lungs/Pleura: Lungs again demonstrate bilateral lower lobe infiltrates with associated effusions right greater than left. These changes have progressed in the interval from the prior exam. Scattered small nodules are again seen to include a 3 mm nodule in the left lower lobe on image  number 81 of series 4. New patchy somewhat nodular density is noted in the left lower lobe on image number 58 of series 4 measuring up to 10 mm in average diameter. Given its abrupt onset and adjacent progressive infiltrate, this is felt to be postinflammatory in nature. Previously seen right lower lobe nodule is now enveloped within the increasing basilar infiltrate. No other sizable nodules are seen. Upper Abdomen: As described on concomitant CT of the abdomen and pelvis. Musculoskeletal: Old healed rib fractures are seen. Heel metastatic changes are noted involving the vertebral bodies from T8-T12. T11 vertebral plana with increased kyphosis is seen. Previously seen nodule in the region of the left breast is again identified and stable. Again possibility of neoplastic lesion deserves consideration. IMPRESSION: Progressive bilateral lower lobe infiltrate with associated effusions. Stable parenchymal nodules when compared with the prior exam. Healed metastatic disease Aortic Atherosclerosis (ICD10-I70.0) and Emphysema (ICD10-J43.9). Electronically Signed   By: Oneil Devonshire M.D.   On: 04/22/2024 19:29   CT RENAL STONE STUDY Result Date: 04/22/2024 CLINICAL DATA:  Flank pain EXAM: CT ABDOMEN AND  PELVIS WITHOUT CONTRAST TECHNIQUE: Multidetector CT imaging of the abdomen and pelvis was performed following the standard protocol without IV contrast. RADIATION DOSE REDUCTION: This exam was performed according to the departmental dose-optimization program which includes automated exposure control, adjustment of the mA and/or kV according to patient size and/or use of iterative reconstruction technique. COMPARISON:  03/25/2024 FINDINGS: Lower chest: Bilateral lower lobe consolidation and effusions are seen. This is worse on the right than the left. Hepatobiliary: Known hepatic lesions are not well visualized on this noncontrast study. Status post cholecystectomy. No biliary dilatation. Pancreas: Unremarkable. No  pancreatic ductal dilatation or surrounding inflammatory changes. Spleen: Normal in size without focal abnormality. Adrenals/Urinary Tract: Left adrenal gland is within normal limits. Right adrenal nodule is again noted and stable. No follow-up is recommended. Kidneys are well visualized bilaterally. Bilateral nonobstructing renal calculi are noted. The largest of these on the left measures 8 mm. Largest on the right measures approximately 3 mm. The ureters are within normal limits. The bladder is decompressed. Stomach/Bowel: Fecal material is noted scattered throughout the colon. No obstructive changes are seen. The appendix has been surgically removed. Small bowel and stomach are within normal limits. No obstructive changes are seen. Vascular/Lymphatic: Aortic atherosclerosis. No enlarged abdominal or pelvic lymph nodes. Reproductive: Status post hysterectomy. No adnexal masses. Other: No abdominal wall hernia or abnormality. No abdominopelvic ascites. Musculoskeletal: Vertebral plana is noted at T11. Increased sclerosis is noted in T10 and T12 consistent with prior metastatic disease. Anterolisthesis of L5 on S1 is noted. Metastatic disease in the L4 spinous process is noted as well. IMPRESSION: Bilateral lower lobe consolidation and effusions right greater than left. Bilateral nonobstructing renal calculi as described. Scattered fecal material without obstructive change. Healed metastatic bony lesions Known hepatic metastatic disease is not well appreciated on this noncontrast study. Electronically Signed   By: Oneil Devonshire M.D.   On: 04/22/2024 19:12     LOS: 2 days   Donalda Applebaum, MD  [facility]    To contact the attending provider between 7A-7P or the covering provider during after hours 7P-7A, please log into the web site [email or web address] and access using universal [place] password for that web site. If you do not have the password, please call the hospital operator.  04/24/2024, 9:44  AM

## 2024-04-25 ENCOUNTER — Inpatient Hospital Stay (HOSPITAL_COMMUNITY)

## 2024-04-25 ENCOUNTER — Encounter (HOSPITAL_COMMUNITY)

## 2024-04-25 DIAGNOSIS — E785 Hyperlipidemia, unspecified: Secondary | ICD-10-CM | POA: Diagnosis not present

## 2024-04-25 DIAGNOSIS — E875 Hyperkalemia: Secondary | ICD-10-CM | POA: Diagnosis not present

## 2024-04-25 DIAGNOSIS — I482 Chronic atrial fibrillation, unspecified: Secondary | ICD-10-CM | POA: Diagnosis not present

## 2024-04-25 DIAGNOSIS — I10 Essential (primary) hypertension: Secondary | ICD-10-CM | POA: Diagnosis not present

## 2024-04-25 DIAGNOSIS — I6529 Occlusion and stenosis of unspecified carotid artery: Secondary | ICD-10-CM | POA: Diagnosis not present

## 2024-04-25 DIAGNOSIS — I4819 Other persistent atrial fibrillation: Secondary | ICD-10-CM | POA: Diagnosis not present

## 2024-04-25 DIAGNOSIS — I6340 Cerebral infarction due to embolism of unspecified cerebral artery: Secondary | ICD-10-CM | POA: Diagnosis not present

## 2024-04-25 DIAGNOSIS — N179 Acute kidney failure, unspecified: Secondary | ICD-10-CM | POA: Diagnosis not present

## 2024-04-25 LAB — CBC
HCT: 28.5 % — ABNORMAL LOW (ref 36.0–46.0)
Hemoglobin: 9.2 g/dL — ABNORMAL LOW (ref 12.0–15.0)
MCH: 29.5 pg (ref 26.0–34.0)
MCHC: 32.3 g/dL (ref 30.0–36.0)
MCV: 91.3 fL (ref 80.0–100.0)
Platelets: 213 K/uL (ref 150–400)
RBC: 3.12 MIL/uL — ABNORMAL LOW (ref 3.87–5.11)
RDW: 16 % — ABNORMAL HIGH (ref 11.5–15.5)
WBC: 6.1 K/uL (ref 4.0–10.5)
nRBC: 0 % (ref 0.0–0.2)

## 2024-04-25 LAB — COMPREHENSIVE METABOLIC PANEL WITH GFR
ALT: 47 U/L — ABNORMAL HIGH (ref 0–44)
AST: 33 U/L (ref 15–41)
Albumin: 2.3 g/dL — ABNORMAL LOW (ref 3.5–5.0)
Alkaline Phosphatase: 136 U/L — ABNORMAL HIGH (ref 38–126)
Anion gap: 10 (ref 5–15)
BUN: 25 mg/dL — ABNORMAL HIGH (ref 8–23)
CO2: 30 mmol/L (ref 22–32)
Calcium: 8 mg/dL — ABNORMAL LOW (ref 8.9–10.3)
Chloride: 96 mmol/L — ABNORMAL LOW (ref 98–111)
Creatinine, Ser: 1.72 mg/dL — ABNORMAL HIGH (ref 0.44–1.00)
GFR, Estimated: 28 mL/min — ABNORMAL LOW (ref >60)
Glucose, Bld: 103 mg/dL — ABNORMAL HIGH (ref 70–99)
Potassium: 2.8 mmol/L — ABNORMAL LOW (ref 3.5–5.1)
Sodium: 136 mmol/L (ref 135–145)
Total Bilirubin: 0.9 mg/dL (ref 0.0–1.2)
Total Protein: 5.3 g/dL — ABNORMAL LOW (ref 6.5–8.1)

## 2024-04-25 LAB — GLUCOSE, CAPILLARY
Glucose-Capillary: 92 mg/dL (ref 70–99)
Glucose-Capillary: 91 mg/dL (ref 70–99)
Glucose-Capillary: 95 mg/dL (ref 70–99)

## 2024-04-25 LAB — T4, FREE: Free T4: 1.01 ng/dL (ref 0.61–1.12)

## 2024-04-25 MED ORDER — FUROSEMIDE 10 MG/ML IJ SOLN
80.0000 mg | Freq: Once | INTRAMUSCULAR | Status: AC
  Administered 2024-04-25: 80 mg via INTRAVENOUS
  Filled 2024-04-25: qty 8

## 2024-04-25 MED ORDER — LOPERAMIDE HCL 2 MG PO CAPS
2.0000 mg | ORAL_CAPSULE | ORAL | Status: DC | PRN
  Administered 2024-04-25 (×2): 2 mg via ORAL
  Filled 2024-04-25 (×2): qty 1

## 2024-04-25 MED ORDER — ROSUVASTATIN CALCIUM 5 MG PO TABS: 10.0000 mg | ORAL_TABLET | Freq: Every day | ORAL | Status: DC

## 2024-04-25 MED ORDER — ROSUVASTATIN CALCIUM 5 MG PO TABS
5.0000 mg | ORAL_TABLET | Freq: Every day | ORAL | Status: DC
  Administered 2024-04-25 – 2024-04-29 (×5): 5 mg via ORAL
  Filled 2024-04-25 (×5): qty 1

## 2024-04-25 MED ORDER — POTASSIUM CHLORIDE CRYS ER 20 MEQ PO TBCR
40.0000 meq | EXTENDED_RELEASE_TABLET | Freq: Once | ORAL | Status: AC
  Administered 2024-04-25: 40 meq via ORAL
  Filled 2024-04-25: qty 2

## 2024-04-25 MED ORDER — DILTIAZEM HCL ER COATED BEADS 240 MG PO CP24
360.0000 mg | ORAL_CAPSULE | Freq: Every day | ORAL | Status: DC
  Administered 2024-04-25 – 2024-04-30 (×6): 360 mg via ORAL
  Filled 2024-04-25 (×6): qty 1

## 2024-04-25 MED ORDER — POTASSIUM CHLORIDE 10 MEQ/100ML IV SOLN
10.0000 meq | INTRAVENOUS | Status: AC
  Administered 2024-04-25 (×3): 10 meq via INTRAVENOUS
  Filled 2024-04-25 (×3): qty 100

## 2024-04-25 MED ORDER — METOPROLOL TARTRATE 50 MG PO TABS
75.0000 mg | ORAL_TABLET | Freq: Two times a day (BID) | ORAL | Status: DC
  Administered 2024-04-25 (×2): 75 mg via ORAL
  Filled 2024-04-25 (×2): qty 1

## 2024-04-25 MED ORDER — SODIUM CHLORIDE 0.9 % IV SOLN
3.0000 g | Freq: Two times a day (BID) | INTRAVENOUS | Status: AC
  Administered 2024-04-25 – 2024-04-29 (×10): 3 g via INTRAVENOUS
  Filled 2024-04-25 (×10): qty 8

## 2024-04-25 MED ORDER — GUAIFENESIN ER 600 MG PO TB12
600.0000 mg | ORAL_TABLET | Freq: Two times a day (BID) | ORAL | Status: DC
  Administered 2024-04-25 – 2024-04-30 (×11): 600 mg via ORAL
  Filled 2024-04-25 (×11): qty 1

## 2024-04-25 MED ORDER — AZITHROMYCIN 500 MG PO TABS
500.0000 mg | ORAL_TABLET | Freq: Every day | ORAL | Status: AC
  Administered 2024-04-25 – 2024-04-27 (×3): 500 mg via ORAL
  Filled 2024-04-25 (×3): qty 1

## 2024-04-25 NOTE — Progress Notes (Addendum)
 PROGRESS NOTE        PATIENT DETAILS Name: [name] Age: 89 y.o. Sex: female Date of Birth: 1933-11-26 Admit Date: 04/22/2024 Admitting Physician Donnice JONELLE Beals, MD ERE:Cjmjijmjgjw, [name], MD  Brief Summary: Patient is a 89 y.o.  female with history of metastatic breast cancer, HTN, CKD stage IIIb, chronic HFrEF-who was sent from nephrology clinic for evaluation of hyperkalemia and AKI.  Significant events: 7/17>> admit to ICU-severe hyperkalemia-underwent emergent HD. 7/18>> RUE weakness-code stroke-MRI brain positive for acute CVA 7/19>> transferred to [facility]  Significant studies: 7/17>> CT renal stone study: No hydronephrosis, B/L lower lobe consolidation/effusion. 7/17>> CT chest: Progressive B/L lower lobe infiltrate with associated effusions. 7/18>> MRI brain: Acute infarct posterior left frontal white matter. 7/18>> TTE: EF 65-70% 7/18>> A1c: 6.1 7/19>> MRA head: No LVO, intracranial atherosclerosis-mild stenosis of left MCA M1, right PCA P1 segments. 7/19>> LDL: 20.  Significant microbiology data: None  Procedures: None  Consults: PCCM Neurology Neurology  Subjective: Complains of cough/congestion-right upper extremity weakness is improved.  Objective: Vitals: Blood pressure (!) 179/88, pulse 94, temperature 98.4 F (36.9 C), temperature source Oral, resp. rate 19, weight 52.3 kg, last menstrual period 10/07/1974, SpO2 95%.   Exam: Awake/alert Not in any distress Chest: Bibasilar rales CVS: S1-S2 regular Extremities no edema Minimal distal RUE weakness today.  Pertinent Labs/Radiology:    Latest Ref Rng & Units 04/25/2024    3:08 AM 04/23/2024    4:16 AM 04/22/2024    8:46 PM  CBC  WBC 4.0 - 10.5 K/uL 6.1  12.1  16.1   Hemoglobin 12.0 - 15.0 g/dL 9.2  9.1  8.8   Hematocrit 36.0 - 46.0 % 28.5  27.8  28.7   Platelets 150 - 400 K/uL 213  209  239     Lab Results  Component Value Date   NA 136 04/25/2024    K 2.8 (L) 04/25/2024   CL 96 (L) 04/25/2024   CO2 30 04/25/2024     Assessment/Plan: Hyperkalemia Likely secondary to worsening renal function. Required emergent dialysis on initial presentation Now resolved  AKI on CKD stage IIIb Unclear etiology of AKI but suspicion for hemodynamic mediated kidney injury Renal function has improved with supportive care-continue to follow closely. 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 Jeffrie) and stroke MD-has been started on Eliquis . Resume statin-as transaminases have improved. Stroke team following  Persistent A-fib with RVR Rate controlled this morning Continue metoprolol  Change Cardizem  to usual long-acting regimen Now on Eliquis -see above.    Elevated TSH Hold off on starting levothyroxine-this could be subclinical hypothyroidism or sick euthyroid syndrome-or early hypothyroidism. Checking free T4  B/L PNA More congested today-coughing Reviewed prior imaging studies-has clear-cut infiltrates Starting Unasyn /Zithromax   B/L pleural effusions Probably transudative in etiology in the setting of CHF/worsening renal function Diuresis-repeat imaging in a few weeks Currently these effusions do not appear to be symptomatic.  Normocytic anemia Likely due to combination of CKD/malignancy Follow Hb  Hypokalemia Likely due to furosemide  Replace as needed.  Transaminitis Mild Unclear etiology Downtrending LFTs today-should be okay to start statins.  HTN BP fluctuating Continue metoprolol -Cardizem  long-acting to be started today Continue Imdur  Follow/optimize.  History of breast cancer with metastatic disease Prior history of ovarian cancer Continue outpatient  follow-up with oncology.  Code status:   Code Status: Limited: Do not attempt resuscitation (DNR) -DNR-LIMITED -Do Not Intubate/DNI    DVT Prophylaxis: apixaban  (ELIQUIS ) tablet 2.5 mg Start: 04/24/24 1100 apixaban  (ELIQUIS ) tablet 2.5 mg    Family Communication: Daughter-Carolyn [phone] left VM 7/20   Disposition Plan: Status is: Inpatient Remains inpatient appropriate because: Severity of illness   Planned Discharge Destination:Home health   Diet: Diet Order             Diet regular Room service appropriate? Yes; Fluid consistency: Thin  Diet effective now                     Antimicrobial agents: Anti-infectives (From admission, onward)    Start     Dose/Rate Route Frequency Ordered Stop   04/25/24 1015  azithromycin  (ZITHROMAX ) tablet 500 mg        500 mg Oral Daily 04/25/24 0917 04/28/24 0959        MEDICATIONS: Scheduled Meds:  apixaban   2.5 mg Oral BID   azithromycin   500 mg Oral Daily   Chlorhexidine  Gluconate Cloth  6 each Topical Q0600   diltiazem  (CARDIZEM  CD) 24 hr capsule 360 mg  360 mg Oral Daily   guaiFENesin   600 mg Oral BID   isosorbide  mononitrate  60 mg Oral Daily   metoprolol  tartrate  75 mg Oral BID   sodium chloride  flush  10-40 mL Intracatheter Q12H   Continuous Infusions:  anticoagulant sodium citrate      potassium chloride  10 mEq (04/25/24 0923)   PRN Meds:.alteplase , anticoagulant sodium citrate , benzonatate , docusate sodium , heparin , hydrALAZINE , ipratropium-albuterol , labetalol , lidocaine  (PF), lidocaine -prilocaine , metoprolol  tartrate, ondansetron  (ZOFRAN ) IV, oxyCODONE , pentafluoroprop-tetrafluoroeth, polyethylene glycol, sodium chloride  flush   I have personally reviewed following labs and imaging studies  LABORATORY DATA: CBC: Recent Labs  Lab 04/22/24 1041 04/22/24 1041 04/22/24 1612 04/22/24 1624 04/22/24 1708 04/22/24 2046 04/23/24 0416 04/25/24 0308  WBC 11.3*  --  12.6*  --   --  16.1* 12.1* 6.1   NEUTROABS 9.3*  --  9.6*  --   --   --   --   --   HGB 9.2*   < > 9.0* 9.2* 7.8* 8.8* 9.1* 9.2*  HCT 29.5*  --  29.8* 27.0* 23.0* 28.7* 27.8* 28.5*  MCV 93.7  --  96.8  --   --  96.0 89.1 91.3  PLT 228  --  259  --   --  239 209 213   < > = values in this interval not displayed.    Basic Metabolic Panel: Recent Labs  Lab 04/22/24 1041 04/22/24 1131 04/22/24 1612 04/22/24 1624 04/22/24 1708 04/22/24 2046 04/23/24 0416 04/24/24 1016 04/25/24 0308  NA 137   < > 135   < > 136 138 136 136 136  K 6.5*   < > 7.4*   < > 6.6* 6.5* 3.8 3.3* 2.8*  CL 108   < > 110   < > 113* 112* 98 93* 96*  CO2 15*   < > 12*  --   --  14* 25 30 30   GLUCOSE 131*   < > 259*   < > 291* 160* 102* 120* 103*  BUN 45*   < > 46*   < > 43* 46* 17 23 25*  CREATININE 2.98*   < > 3.09*   < > 3.10* 3.02* 1.58*  1.72* 1.72*  CALCIUM  8.4*   < > 8.0*  --   --  8.8* 8.6* 7.8* 8.0*  MG 1.1*  --  1.1*  --   --   --  2.0  --   --   PHOS  --   --   --   --   --  6.0* 3.8 3.8  --    < > = values in this interval not displayed.    GFR: Estimated Creatinine Clearance: 19 mL/min (A) (by C-G formula based on SCr of 1.72 mg/dL (H)).  Liver Function Tests: Recent Labs  Lab 04/22/24 1041 04/22/24 1612 04/22/24 2046 04/23/24 0416 04/24/24 1016 04/24/24 1030 04/25/24 0308  AST 25 49*  --  116*  --  44* 33  ALT 36 39  --  104*  --  59* 47*  ALKPHOS 137* 118  --  200*  --  153* 136*  BILITOT 0.4 0.6  --  0.7  --  0.6 0.9  PROT 6.2* 5.5*  --  5.8*  --  5.2* 5.3*  ALBUMIN  3.5 2.5* 2.5* 2.7* 2.2* 2.3* 2.3*   No results for input(s): LIPASE, AMYLASE in the last 168 hours. Recent Labs  Lab 04/22/24 1947  AMMONIA 24    Coagulation Profile: Recent Labs  Lab 04/23/24 0416  INR 1.3*    Cardiac Enzymes: No results for input(s): CKTOTAL, CKMB, CKMBINDEX, TROPONINI in the last 168 hours.  BNP (last 3 results) No results for input(s): PROBNP in the last 8760 hours.  Lipid Profile: Recent Labs     04/24/24 0500  CHOL 67  HDL 32*  LDLCALC 20  TRIG 74  CHOLHDL 2.1    Thyroid  Function Tests: Recent Labs    04/22/24 1612  TSH 18.047*    Anemia Panel: No results for input(s): VITAMINB12, FOLATE, FERRITIN, TIBC, IRON, RETICCTPCT in the last 72 hours.  Urine analysis:    Component Value Date/Time   COLORURINE YELLOW 11/21/2023 1840   APPEARANCEUR CLEAR 11/21/2023 1840   LABSPEC 1.016 11/21/2023 1840   PHURINE 5.0 11/21/2023 1840   GLUCOSEU NEGATIVE 11/21/2023 1840   HGBUR NEGATIVE 11/21/2023 1840   BILIRUBINUR NEGATIVE 11/21/2023 1840   BILIRUBINUR n 09/15/2015 1123   KETONESUR NEGATIVE 11/21/2023 1840   PROTEINUR NEGATIVE 11/21/2023 1840   UROBILINOGEN negative 09/15/2015 1123   UROBILINOGEN 0.2 04/11/2009 1015   NITRITE NEGATIVE 11/21/2023 1840   LEUKOCYTESUR NEGATIVE 11/21/2023 1840    Sepsis Labs: Lactic Acid, Venous    Component Value Date/Time   LATICACIDVEN 1.5 04/09/2024 1342    MICROBIOLOGY: Recent Results (from the past 240 hours)  MRSA Next Gen by PCR, Nasal     Status: None   Collection Time: 04/22/24 11:06 PM   Specimen: Nasal Mucosa; Nasal Swab  Result Value Ref Range Status   MRSA by PCR Next Gen NOT DETECTED NOT DETECTED Final    Comment: (NOTE) The GeneXpert MRSA Assay (FDA approved for NASAL specimens only), is one component of a comprehensive MRSA colonization surveillance program. It is not intended to diagnose MRSA infection nor to guide or monitor treatment for MRSA infections. Test performance is not FDA approved in patients less than 29 years old. Performed at [facility], [address], [place], [place] [address]     RADIOLOGY STUDIES/RESULTS: DG Chest Port 1V same Day Result Date: 04/24/2024 CLINICAL DATA:  141880 SOB (shortness of breath) 141880 EXAM: PORTABLE CHEST - 1 VIEW SAME DAY COMPARISON:  CT 04/22/2024 and previous FINDINGS:  Left IJ HD catheter to the distal SVC. No pneumothorax. Moderate pulmonary  vascular congestion. Pleural effusions right greater than left with some atelectasis/consolidation at the lung bases. Heart size and mediastinal contours are within normal limits. Aortic Atherosclerosis (ICD10-170.0). Regional bones unremarkable. IMPRESSION: Pulmonary vascular congestion with bilateral pleural effusions and basilar atelectasis/consolidation. Electronically Signed   By: JONETTA Faes M.D.   On: 04/24/2024 13:18   MR ANGIO HEAD WO CONTRAST Result Date: 04/24/2024 CLINICAL DATA:  89 year old female code stroke presentation, neurologic deficit. Small area of patchy left centrum semiovale ischemia on brain MRI last night. EXAM: MRA HEAD WITHOUT CONTRAST TECHNIQUE: Angiographic images of the Circle of Willis were acquired using MRA technique without intravenous contrast. COMPARISON:  Brain MRI 04/23/2024. FINDINGS: Anterior circulation: Antegrade flow in both ICA siphons. Left-side persistent trigeminal artery. No ICA siphon stenosis. Patent carotid termini. Dominant left and diminutive or absent right ACA A1 segment. Normal anterior communicating artery. Visible bilateral ACA branches are within normal limits. MCA M1 segments are tortuous, more so the left. There is mild to moderate left MCA M1 segment irregularity and stenosis on series 1020 image 12. MCA bifurcations are patent and bilateral MCA branches are within normal limits. Posterior circulation: Antegrade flow in the posterior circulation with dominant appearing distal right vertebral artery. Diminutive distal left vertebral artery appears to terminates in PICA. Right PICA appears patent. Patent basilar artery without stenosis. And furthermore there is a left-sided persistent trigeminal artery (series 5, image 108), also contributing to the distal basilar. Normal SCA and left PCA origins. Fetal type right PCA origin. Left posterior communicating artery diminutive or absent. Left PCA branches are within normal limits. There is mild to moderate  irregularity and stenosis of the right PCA P2 segment broadly (series 1032, image 9). Distal right PCA branches remain patent. Anatomic variants: Persistent left side trigeminal artery. Dominant right vertebral, left vertebral terminates in PICA. Fetal right PCA origin. Dominant left and diminutive or absent right ACA A1 segments. Other: No intracranial mass effect or ventriculomegaly. IMPRESSION: 1. Negative for large vessel occlusion. 2. Intracranial atherosclerosis, but mild for age overall. Mild to moderate irregularity and stenosis of the left MCA M1 and right PCA P2 segments. 3. Multiple vascular anatomic variations, including persistent Left side Trigeminal Artery. Electronically Signed   By: VEAR Hurst M.D.   On: 04/24/2024 07:07   MR BRAIN WO CONTRAST Result Date: 04/23/2024 EXAM: MRI BRAIN WITHOUT CONTRAST 04/23/2024 09:10:11 PM TECHNIQUE: Multiplanar multisequence MRI of the head/brain was performed without the administration of intravenous contrast. COMPARISON: None available. CLINICAL HISTORY: Stroke, follow up. FINDINGS: BRAIN AND VENTRICLES: Small area of early subacute or acute ischemia within the posterior left frontal white matter. Bilateral early confluent hyperintense T2-weighted signal within the cerebral white matter. No intracranial hemorrhage. No mass. No midline shift. No hydrocephalus. The sella is unremarkable. Normal flow voids. ORBITS: No acute abnormality. SINUSES AND MASTOIDS: No acute abnormality. BONES AND SOFT TISSUES: Normal marrow signal. No acute soft tissue abnormality. IMPRESSION: 1. Small area of early subacute/acute ischemia within the posterior left frontal white matter. 2. Bilateral early confluent hyperintense T2-weighted signal within the cerebral white matter, most commonly due to chronic small vessel disease. Electronically signed by: Franky Stanford MD 04/23/2024 09:57 PM EDT RP Workstation: [id number]   ECHOCARDIOGRAM COMPLETE Result Date: 04/23/2024    ECHOCARDIOGRAM  REPORT   Patient Name:   [name] Date of Exam: 04/23/2024 Medical Rec #:  [id number]        Height:  66.0 in Accession #:    [id number]       Weight:       112.9 lb Date of Birth:  [birth year]       BSA:          1.568 m Patient Age:    89 years         BP:           172/65 mmHg Patient Gender: F                HR:           84 bpm. Exam Location:  Inpatient Procedure: 2D Echo, Cardiac Doppler and Color Doppler (Both Spectral and Color            Flow Doppler were utilized during procedure). Indications:    Acute Renal Failure  History:        Patient has prior history of Echocardiogram examinations. Renal                 Failure.  Sonographer:    Vella Key Referring Phys: [phone] MATTHEW R HUNSUCKER IMPRESSIONS  1. Left ventricular ejection fraction, by estimation, is 65 to 70%. The left ventricle has normal function. The left ventricle has no regional wall motion abnormalities. There is mild left ventricular hypertrophy. Left ventricular diastolic parameters were normal.  2. Right ventricular systolic function is normal. The right ventricular size is mildly enlarged.  3. Left atrial size was moderately dilated.  4. Mild mitral valve regurgitation.  5. The aortic valve is tricuspid. Aortic valve regurgitation is not visualized. Aortic valve sclerosis/calcification is present, without any evidence of aortic stenosis. FINDINGS  Left Ventricle: Left ventricular ejection fraction, by estimation, is 65 to 70%. The left ventricle has normal function. The left ventricle has no regional wall motion abnormalities. The left ventricular internal cavity size was small. There is mild left ventricular hypertrophy. Left ventricular diastolic parameters were normal. Right Ventricle: The right ventricular size is mildly enlarged. Right vetricular wall thickness was not assessed. Right ventricular systolic function is normal. Left Atrium: Left atrial size was moderately dilated. Right Atrium: Right atrial size was normal  in size. Pericardium: There is no evidence of pericardial effusion. Mitral Valve: There is mild thickening of the mitral valve leaflet(s). Mild to moderate mitral annular calcification. Mild mitral valve regurgitation. Tricuspid Valve: The tricuspid valve is normal in structure. Tricuspid valve regurgitation is trivial. Aortic Valve: The aortic valve is tricuspid. Aortic valve regurgitation is not visualized. Aortic valve sclerosis/calcification is present, without any evidence of aortic stenosis. Pulmonic Valve: The pulmonic valve was normal in structure. Pulmonic valve regurgitation is trivial. Aorta: The aortic root and ascending aorta are structurally normal, with no evidence of dilitation. IAS/Shunts: No atrial level shunt detected by color flow Doppler.  LEFT VENTRICLE PLAX 2D LVIDd:         3.10 cm     Diastology LVIDs:         1.80 cm     LV e' medial:    12.80 cm/s LV PW:         1.30 cm     LV E/e' medial:  15.1 LV IVS:        1.10 cm     LV e' lateral:   13.50 cm/s LVOT diam:     1.70 cm     LV E/e' lateral: 14.3 LV SV:         70 LV SV Index:   45  LVOT Area:     2.27 cm  LV Volumes (MOD) LV vol d, MOD A2C: 58.4 ml LV vol d, MOD A4C: 83.9 ml LV vol s, MOD A2C: 15.4 ml LV vol s, MOD A4C: 32.8 ml LV SV MOD A2C:     43.0 ml LV SV MOD A4C:     83.9 ml LV SV MOD BP:      46.6 ml RIGHT VENTRICLE RV Basal diam:  3.90 cm RV S prime:     14.30 cm/s TAPSE (M-mode): 1.5 cm LEFT ATRIUM             Index        RIGHT ATRIUM           Index LA diam:        4.80 cm 3.06 cm/m   RA Area:     12.00 cm LA Vol (A2C):   68.5 ml 43.68 ml/m  RA Volume:   28.80 ml  18.36 ml/m LA Vol (A4C):   63.3 ml 40.36 ml/m LA Biplane Vol: 66.4 ml 42.34 ml/m  AORTIC VALVE LVOT Vmax:   135.00 cm/s LVOT Vmean:  105.000 cm/s LVOT VTI:    0.309 m  AORTA Ao Root diam: 2.80 cm Ao Asc diam:  2.80 cm MITRAL VALVE MV Area (PHT): 5.42 cm     SHUNTS MV Decel Time: 140 msec     Systemic VTI:  0.31 m MV E velocity: 193.00 cm/s  Systemic Diam: 1.70  cm MV A velocity: 64.50 cm/s MV E/A ratio:  2.99 Vina Gull MD Electronically signed by Vina Gull MD Signature Date/Time: 04/23/2024/3:01:59 PM    Final    CT HEAD CODE STROKE WO CONTRAST Result Date: 04/23/2024 CLINICAL DATA:  Code stroke. Neuro deficit, acute, stroke suspected. Right arm weakness. EXAM: CT HEAD WITHOUT CONTRAST TECHNIQUE: Contiguous axial images were obtained from the base of the skull through the vertex without intravenous contrast. RADIATION DOSE REDUCTION: This exam was performed according to the departmental dose-optimization program which includes automated exposure control, adjustment of the mA and/or kV according to patient size and/or use of iterative reconstruction technique. COMPARISON:  Head CT 04/21/2008. FINDINGS: Brain: Generalized cerebral atrophy. Patchy and ill-defined hypoattenuation within the cerebral white matter, nonspecific but compatible with moderate to advanced chronic small vessel ischemic disease. There is no acute intracranial hemorrhage. No demarcated cortical infarct. No extra-axial fluid collection. No evidence of an intracranial mass. No midline shift. Vascular: No hyperdense vessel.  Atherosclerotic calcifications. Skull: No calvarial fracture or aggressive osseous lesion. Sinuses/Orbits: No mass or acute finding within the imaged orbits. No significant paranasal sinus disease. ASPECTS [facility] Stroke Program Early CT Score) - Ganglionic level infarction (caudate, lentiform nuclei, internal capsule, insula, M1-M3 cortex): 7 - Supraganglionic infarction (M4-M6 cortex): 3 Total score (0-10 with 10 being normal): 10 No evidence of an acute intracranial abnormality. These results were communicated to Dr. Matthews At 1:39 pmon 7/18/2025by text page via the [facility] messaging system. IMPRESSION: 1. No evidence of an acute intracranial abnormality. 2. Parenchymal atrophy and chronic small vessel ischemic disease, progressed from the prior head CT of 04/21/2008.  Electronically Signed   By: Rockey Childs D.O.   On: 04/23/2024 13:39     LOS: 3 days   Donalda Applebaum, MD  [facility]    To contact the attending provider between 7A-7P or the covering provider during after hours 7P-7A, please log into the web site [email or web address] and access using universal [place] password for that web site.  If you do not have the password, please call the hospital operator.  04/25/2024, 9:39 AM

## 2024-04-25 NOTE — Progress Notes (Signed)
 Unasyn  ordered for aspiration PNA.   Crcl~20ml/min  Unasyn  3g IV q12  [name] Batch, PharmD, [place], AAHIVP, CPP Infectious Disease Pharmacist 04/25/2024 9:54 AM

## 2024-04-25 NOTE — Plan of Care (Signed)
  Problem: Education: Goal: Knowledge of General Education information will improve Description: Including pain rating scale, medication(s)/side effects and non-pharmacologic comfort measures Outcome: Progressing   Problem: Health Behavior/Discharge Planning: Goal: Ability to manage health-related needs will improve Outcome: Progressing   Problem: Clinical Measurements: Goal: Ability to maintain clinical measurements within normal limits will improve Outcome: Progressing Goal: Respiratory complications will improve Outcome: Progressing   Problem: Activity: Goal: Risk for activity intolerance will decrease Outcome: Progressing   Problem: Safety: Goal: Ability to remain free from injury will improve Outcome: Progressing

## 2024-04-25 NOTE — Progress Notes (Signed)
 STROKE TEAM PROGRESS NOTE   SUBJECTIVE (INTERVAL HISTORY) Her PT is at the bedside.  Overall her condition is stable. Pt sitting in chair, AAO x 3, still mildly lethargic with intermittent coughing, still has afib RVR on tele. Neuro intact, MRI showed small infarct, will start eliquis  today.    OBJECTIVE Temp:  [98 F (36.7 C)-98.4 F (36.9 C)] 98.1 F (36.7 C) (07/20 1242) Pulse Rate:  [77-99] 93 (07/20 1242) Cardiac Rhythm: Atrial fibrillation (07/20 0700) Resp:  [18-20] 19 (07/20 1242) BP: (156-203)/(60-92) 159/92 (07/20 1242) SpO2:  [92 %-98 %] 98 % (07/20 1242) Weight:  [52.3 kg] 52.3 kg (07/20 0500)  Recent Labs  Lab 04/24/24 1943 04/24/24 2336 04/25/24 0325 04/25/24 0745 04/25/24 1120  GLUCAP 164* 130* 92 91 95   Recent Labs  Lab 04/22/24 1041 04/22/24 1131 04/22/24 1612 04/22/24 1624 04/22/24 1708 04/22/24 2046 04/23/24 0416 04/24/24 1016 04/25/24 0308  NA 137   < > 135   < > 136 138 136 136 136  K 6.5*   < > 7.4*   < > 6.6* 6.5* 3.8 3.3* 2.8*  CL 108   < > 110   < > 113* 112* 98 93* 96*  CO2 15*   < > 12*  --   --  14* 25 30 30   GLUCOSE 131*   < > 259*   < > 291* 160* 102* 120* 103*  BUN 45*   < > 46*   < > 43* 46* 17 23 25*  CREATININE 2.98*   < > 3.09*   < > 3.10* 3.02* 1.58* 1.72* 1.72*  CALCIUM  8.4*   < > 8.0*  --   --  8.8* 8.6* 7.8* 8.0*  MG 1.1*  --  1.1*  --   --   --  2.0  --   --   PHOS  --   --   --   --   --  6.0* 3.8 3.8  --    < > = values in this interval not displayed.   Recent Labs  Lab 04/22/24 1041 04/22/24 1612 04/22/24 2046 04/23/24 0416 04/24/24 1016 04/24/24 1030 04/25/24 0308  AST 25 49*  --  116*  --  44* 33  ALT 36 39  --  104*  --  59* 47*  ALKPHOS 137* 118  --  200*  --  153* 136*  BILITOT 0.4 0.6  --  0.7  --  0.6 0.9  PROT 6.2* 5.5*  --  5.8*  --  5.2* 5.3*  ALBUMIN  3.5 2.5* 2.5* 2.7* 2.2* 2.3* 2.3*   Recent Labs  Lab 04/22/24 1041 04/22/24 1041 04/22/24 1612 04/22/24 1624 04/22/24 1708 04/22/24 2046  04/23/24 0416 04/25/24 0308  WBC 11.3*  --  12.6*  --   --  16.1* 12.1* 6.1  NEUTROABS 9.3*  --  9.6*  --   --   --   --   --   HGB 9.2*   < > 9.0* 9.2* 7.8* 8.8* 9.1* 9.2*  HCT 29.5*  --  29.8* 27.0* 23.0* 28.7* 27.8* 28.5*  MCV 93.7  --  96.8  --   --  96.0 89.1 91.3  PLT 228  --  259  --   --  239 209 213   < > = values in this interval not displayed.   No results for input(s): CKTOTAL, CKMB, CKMBINDEX, TROPONINI in the last 168 hours. Recent Labs    04/23/24 0416  LABPROT 16.7*  INR 1.3*   No results for input(s): COLORURINE, LABSPEC, PHURINE, GLUCOSEU, HGBUR, BILIRUBINUR, KETONESUR, PROTEINUR, UROBILINOGEN, NITRITE, LEUKOCYTESUR in the last 72 hours.  Invalid input(s): APPERANCEUR     Component Value Date/Time   CHOL 67 04/24/2024 0500   TRIG 74 04/24/2024 0500   HDL 32 (L) 04/24/2024 0500   CHOLHDL 2.1 04/24/2024 0500   VLDL 15 04/24/2024 0500   LDLCALC 20 04/24/2024 0500   Lab Results  Component Value Date   HGBA1C 6.1 (H) 04/23/2024   No results found for: LABOPIA, COCAINSCRNUR, LABBENZ, AMPHETMU, THCU, LABBARB  No results for input(s): ETH in the last 168 hours.  I have personally reviewed the radiological images below and agree with the radiology interpretations.  DG Chest Port 1V same Day Result Date: 04/24/2024 CLINICAL DATA:  [id number] SOB (shortness of breath) [id number] EXAM: PORTABLE CHEST - 1 VIEW SAME DAY COMPARISON:  CT 04/22/2024 and previous FINDINGS: Left IJ HD catheter to the distal SVC. No pneumothorax. Moderate pulmonary vascular congestion. Pleural effusions right greater than left with some atelectasis/consolidation at the lung bases. Heart size and mediastinal contours are within normal limits. Aortic Atherosclerosis (ICD10-170.0). Regional bones unremarkable. IMPRESSION: Pulmonary vascular congestion with bilateral pleural effusions and basilar atelectasis/consolidation. Electronically Signed   By: JONETTA Faes  M.D.   On: 04/24/2024 13:18   MR ANGIO HEAD WO CONTRAST Result Date: 04/24/2024 CLINICAL DATA:  89 year old female code stroke presentation, neurologic deficit. Small area of patchy left centrum semiovale ischemia on brain MRI last night. EXAM: MRA HEAD WITHOUT CONTRAST TECHNIQUE: Angiographic images of the Circle of Willis were acquired using MRA technique without intravenous contrast. COMPARISON:  Brain MRI 04/23/2024. FINDINGS: Anterior circulation: Antegrade flow in both ICA siphons. Left-side persistent trigeminal artery. No ICA siphon stenosis. Patent carotid termini. Dominant left and diminutive or absent right ACA A1 segment. Normal anterior communicating artery. Visible bilateral ACA branches are within normal limits. MCA M1 segments are tortuous, more so the left. There is mild to moderate left MCA M1 segment irregularity and stenosis on series 1020 image 12. MCA bifurcations are patent and bilateral MCA branches are within normal limits. Posterior circulation: Antegrade flow in the posterior circulation with dominant appearing distal right vertebral artery. Diminutive distal left vertebral artery appears to terminates in PICA. Right PICA appears patent. Patent basilar artery without stenosis. And furthermore there is a left-sided persistent trigeminal artery (series 5, image 108), also contributing to the distal basilar. Normal SCA and left PCA origins. Fetal type right PCA origin. Left posterior communicating artery diminutive or absent. Left PCA branches are within normal limits. There is mild to moderate irregularity and stenosis of the right PCA P2 segment broadly (series 1032, image 9). Distal right PCA branches remain patent. Anatomic variants: Persistent left side trigeminal artery. Dominant right vertebral, left vertebral terminates in PICA. Fetal right PCA origin. Dominant left and diminutive or absent right ACA A1 segments. Other: No intracranial mass effect or ventriculomegaly. IMPRESSION:  1. Negative for large vessel occlusion. 2. Intracranial atherosclerosis, but mild for age overall. Mild to moderate irregularity and stenosis of the left MCA M1 and right PCA P2 segments. 3. Multiple vascular anatomic variations, including persistent Left side Trigeminal Artery. Electronically Signed   By: VEAR Hurst M.D.   On: 04/24/2024 07:07   MR BRAIN WO CONTRAST Result Date: 04/23/2024 EXAM: MRI BRAIN WITHOUT CONTRAST 04/23/2024 09:10:11 PM TECHNIQUE: Multiplanar multisequence MRI of the head/brain was performed without the administration of intravenous contrast. COMPARISON: None available. CLINICAL HISTORY: Stroke, follow  up. FINDINGS: BRAIN AND VENTRICLES: Small area of early subacute or acute ischemia within the posterior left frontal white matter. Bilateral early confluent hyperintense T2-weighted signal within the cerebral white matter. No intracranial hemorrhage. No mass. No midline shift. No hydrocephalus. The sella is unremarkable. Normal flow voids. ORBITS: No acute abnormality. SINUSES AND MASTOIDS: No acute abnormality. BONES AND SOFT TISSUES: Normal marrow signal. No acute soft tissue abnormality. IMPRESSION: 1. Small area of early subacute/acute ischemia within the posterior left frontal white matter. 2. Bilateral early confluent hyperintense T2-weighted signal within the cerebral white matter, most commonly due to chronic small vessel disease. Electronically signed by: Franky Stanford MD 04/23/2024 09:57 PM EDT RP Workstation: [id number]   ECHOCARDIOGRAM COMPLETE Result Date: 04/23/2024    ECHOCARDIOGRAM REPORT   Patient Name:   GIORGIA WAHLER Date of Exam: 04/23/2024 Medical Rec #:  [id number]        Height:       66.0 in Accession #:    [id number]       Weight:       112.9 lb Date of Birth:  02-Jan-1934       BSA:          1.568 m Patient Age:    89 years         BP:           172/65 mmHg Patient Gender: F                HR:           84 bpm. Exam Location:  Inpatient Procedure: 2D Echo, Cardiac  Doppler and Color Doppler (Both Spectral and Color            Flow Doppler were utilized during procedure). Indications:    Acute Renal Failure  History:        Patient has prior history of Echocardiogram examinations. Renal                 Failure.  Sonographer:    Vella Key Referring Phys: [phone] MATTHEW R HUNSUCKER IMPRESSIONS  1. Left ventricular ejection fraction, by estimation, is 65 to 70%. The left ventricle has normal function. The left ventricle has no regional wall motion abnormalities. There is mild left ventricular hypertrophy. Left ventricular diastolic parameters were normal.  2. Right ventricular systolic function is normal. The right ventricular size is mildly enlarged.  3. Left atrial size was moderately dilated.  4. Mild mitral valve regurgitation.  5. The aortic valve is tricuspid. Aortic valve regurgitation is not visualized. Aortic valve sclerosis/calcification is present, without any evidence of aortic stenosis. FINDINGS  Left Ventricle: Left ventricular ejection fraction, by estimation, is 65 to 70%. The left ventricle has normal function. The left ventricle has no regional wall motion abnormalities. The left ventricular internal cavity size was small. There is mild left ventricular hypertrophy. Left ventricular diastolic parameters were normal. Right Ventricle: The right ventricular size is mildly enlarged. Right vetricular wall thickness was not assessed. Right ventricular systolic function is normal. Left Atrium: Left atrial size was moderately dilated. Right Atrium: Right atrial size was normal in size. Pericardium: There is no evidence of pericardial effusion. Mitral Valve: There is mild thickening of the mitral valve leaflet(s). Mild to moderate mitral annular calcification. Mild mitral valve regurgitation. Tricuspid Valve: The tricuspid valve is normal in structure. Tricuspid valve regurgitation is trivial. Aortic Valve: The aortic valve is tricuspid. Aortic valve regurgitation is not  visualized. Aortic valve sclerosis/calcification is present, without any  evidence of aortic stenosis. Pulmonic Valve: The pulmonic valve was normal in structure. Pulmonic valve regurgitation is trivial. Aorta: The aortic root and ascending aorta are structurally normal, with no evidence of dilitation. IAS/Shunts: No atrial level shunt detected by color flow Doppler.  LEFT VENTRICLE PLAX 2D LVIDd:         3.10 cm     Diastology LVIDs:         1.80 cm     LV e' medial:    12.80 cm/s LV PW:         1.30 cm     LV E/e' medial:  15.1 LV IVS:        1.10 cm     LV e' lateral:   13.50 cm/s LVOT diam:     1.70 cm     LV E/e' lateral: 14.3 LV SV:         70 LV SV Index:   45 LVOT Area:     2.27 cm  LV Volumes (MOD) LV vol d, MOD A2C: 58.4 ml LV vol d, MOD A4C: 83.9 ml LV vol s, MOD A2C: 15.4 ml LV vol s, MOD A4C: 32.8 ml LV SV MOD A2C:     43.0 ml LV SV MOD A4C:     83.9 ml LV SV MOD BP:      46.6 ml RIGHT VENTRICLE RV Basal diam:  3.90 cm RV S prime:     14.30 cm/s TAPSE (M-mode): 1.5 cm LEFT ATRIUM             Index        RIGHT ATRIUM           Index LA diam:        4.80 cm 3.06 cm/m   RA Area:     12.00 cm LA Vol (A2C):   68.5 ml 43.68 ml/m  RA Volume:   28.80 ml  18.36 ml/m LA Vol (A4C):   63.3 ml 40.36 ml/m LA Biplane Vol: 66.4 ml 42.34 ml/m  AORTIC VALVE LVOT Vmax:   135.00 cm/s LVOT Vmean:  105.000 cm/s LVOT VTI:    0.309 m  AORTA Ao Root diam: 2.80 cm Ao Asc diam:  2.80 cm MITRAL VALVE MV Area (PHT): 5.42 cm     SHUNTS MV Decel Time: 140 msec     Systemic VTI:  0.31 m MV E velocity: 193.00 cm/s  Systemic Diam: 1.70 cm MV A velocity: 64.50 cm/s MV E/A ratio:  2.99 Vina Gull MD Electronically signed by Vina Gull MD Signature Date/Time: 04/23/2024/3:01:59 PM    Final    CT HEAD CODE STROKE WO CONTRAST Result Date: 04/23/2024 CLINICAL DATA:  Code stroke. Neuro deficit, acute, stroke suspected. Right arm weakness. EXAM: CT HEAD WITHOUT CONTRAST TECHNIQUE: Contiguous axial images were obtained from the base of  the skull through the vertex without intravenous contrast. RADIATION DOSE REDUCTION: This exam was performed according to the departmental dose-optimization program which includes automated exposure control, adjustment of the mA and/or kV according to patient size and/or use of iterative reconstruction technique. COMPARISON:  Head CT 04/21/2008. FINDINGS: Brain: Generalized cerebral atrophy. Patchy and ill-defined hypoattenuation within the cerebral white matter, nonspecific but compatible with moderate to advanced chronic small vessel ischemic disease. There is no acute intracranial hemorrhage. No demarcated cortical infarct. No extra-axial fluid collection. No evidence of an intracranial mass. No midline shift. Vascular: No hyperdense vessel.  Atherosclerotic calcifications. Skull: No calvarial fracture or aggressive osseous lesion. Sinuses/Orbits: No mass  or acute finding within the imaged orbits. No significant paranasal sinus disease. ASPECTS [facility] Stroke Program Early CT Score) - Ganglionic level infarction (caudate, lentiform nuclei, internal capsule, insula, M1-M3 cortex): 7 - Supraganglionic infarction (M4-M6 cortex): 3 Total score (0-10 with 10 being normal): 10 No evidence of an acute intracranial abnormality. These results were communicated to Dr. Matthews At 1:39 pmon 7/18/2025by text page via the [facility] messaging system. IMPRESSION: 1. No evidence of an acute intracranial abnormality. 2. Parenchymal atrophy and chronic small vessel ischemic disease, progressed from the prior head CT of 04/21/2008. Electronically Signed   By: Rockey Childs D.O.   On: 04/23/2024 13:39   CT CHEST WO CONTRAST Result Date: 04/22/2024 CLINICAL DATA:  History of lung carcinoma with metastatic disease, shortness of breath EXAM: CT CHEST WITHOUT CONTRAST TECHNIQUE: Multidetector CT imaging of the chest was performed following the standard protocol without IV contrast. RADIATION DOSE REDUCTION: This exam was performed according  to the departmental dose-optimization program which includes automated exposure control, adjustment of the mA and/or kV according to patient size and/or use of iterative reconstruction technique. COMPARISON:  04/09/2024 FINDINGS: Cardiovascular: Heart is stable in size. Heavy coronary calcifications are noted. Atherosclerotic calcifications of the aorta are noted without aneurysmal dilatation. Mediastinum/Nodes: Thoracic inlet shows evidence of a left jugular dialysis catheter the esophagus as visualized is within normal limits. No hilar or mediastinal adenopathy is noted. Lungs/Pleura: Lungs again demonstrate bilateral lower lobe infiltrates with associated effusions right greater than left. These changes have progressed in the interval from the prior exam. Scattered small nodules are again seen to include a 3 mm nodule in the left lower lobe on image number 81 of series 4. New patchy somewhat nodular density is noted in the left lower lobe on image number 58 of series 4 measuring up to 10 mm in average diameter. Given its abrupt onset and adjacent progressive infiltrate, this is felt to be postinflammatory in nature. Previously seen right lower lobe nodule is now enveloped within the increasing basilar infiltrate. No other sizable nodules are seen. Upper Abdomen: As described on concomitant CT of the abdomen and pelvis. Musculoskeletal: Old healed rib fractures are seen. Heel metastatic changes are noted involving the vertebral bodies from T8-T12. T11 vertebral plana with increased kyphosis is seen. Previously seen nodule in the region of the left breast is again identified and stable. Again possibility of neoplastic lesion deserves consideration. IMPRESSION: Progressive bilateral lower lobe infiltrate with associated effusions. Stable parenchymal nodules when compared with the prior exam. Healed metastatic disease Aortic Atherosclerosis (ICD10-I70.0) and Emphysema (ICD10-J43.9). Electronically Signed   By: Oneil Devonshire M.D.   On: 04/22/2024 19:29   CT RENAL STONE STUDY Result Date: 04/22/2024 CLINICAL DATA:  Flank pain EXAM: CT ABDOMEN AND PELVIS WITHOUT CONTRAST TECHNIQUE: Multidetector CT imaging of the abdomen and pelvis was performed following the standard protocol without IV contrast. RADIATION DOSE REDUCTION: This exam was performed according to the departmental dose-optimization program which includes automated exposure control, adjustment of the mA and/or kV according to patient size and/or use of iterative reconstruction technique. COMPARISON:  03/25/2024 FINDINGS: Lower chest: Bilateral lower lobe consolidation and effusions are seen. This is worse on the right than the left. Hepatobiliary: Known hepatic lesions are not well visualized on this noncontrast study. Status post cholecystectomy. No biliary dilatation. Pancreas: Unremarkable. No pancreatic ductal dilatation or surrounding inflammatory changes. Spleen: Normal in size without focal abnormality. Adrenals/Urinary Tract: Left adrenal gland is within normal limits. Right adrenal nodule is again  noted and stable. No follow-up is recommended. Kidneys are well visualized bilaterally. Bilateral nonobstructing renal calculi are noted. The largest of these on the left measures 8 mm. Largest on the right measures approximately 3 mm. The ureters are within normal limits. The bladder is decompressed. Stomach/Bowel: Fecal material is noted scattered throughout the colon. No obstructive changes are seen. The appendix has been surgically removed. Small bowel and stomach are within normal limits. No obstructive changes are seen. Vascular/Lymphatic: Aortic atherosclerosis. No enlarged abdominal or pelvic lymph nodes. Reproductive: Status post hysterectomy. No adnexal masses. Other: No abdominal wall hernia or abnormality. No abdominopelvic ascites. Musculoskeletal: Vertebral plana is noted at T11. Increased sclerosis is noted in T10 and T12 consistent with prior  metastatic disease. Anterolisthesis of L5 on S1 is noted. Metastatic disease in the L4 spinous process is noted as well. IMPRESSION: Bilateral lower lobe consolidation and effusions right greater than left. Bilateral nonobstructing renal calculi as described. Scattered fecal material without obstructive change. Healed metastatic bony lesions Known hepatic metastatic disease is not well appreciated on this noncontrast study. Electronically Signed   By: Oneil Devonshire M.D.   On: 04/22/2024 19:12   ECHOCARDIOGRAM COMPLETE Result Date: 04/09/2024    ECHOCARDIOGRAM REPORT   Patient Name:   SUZANE VANDERWEIDE Date of Exam: 04/09/2024 Medical Rec #:  [id number]        Height:       66.0 in Accession #:    [id number]       Weight:       109.9 lb Date of Birth:  07-Nov-1933       BSA:          1.551 m Patient Age:    89 years         BP:           154/66 mmHg Patient Gender: F                HR:           99 bpm. Exam Location:  Inpatient Procedure: 2D Echo, Cardiac Doppler and Color Doppler (Both Spectral and Color            Flow Doppler were utilized during procedure). Indications:    Atrial Fibrillation  History:        Patient has prior history of Echocardiogram examinations, most                 recent 12/24/2023. CHF, CAD, Arrythmias:Atrial Fibrillation; Risk                 Factors:Hypertension.  Sonographer:    Philomena Daring Referring Phys: [id number] MARSA NOVAK MELVIN IMPRESSIONS  1. Left ventricular ejection fraction, by estimation, is 60 to 65%. The left ventricle has normal function. The left ventricle has no regional wall motion abnormalities. There is moderate asymmetric left ventricular hypertrophy of the basal and septal segments. Left ventricular diastolic function could not be evaluated.  2. Right ventricular systolic function is normal. The right ventricular size is normal. Tricuspid regurgitation signal is inadequate for assessing PA pressure.  3. Left atrial size was severely dilated.  4. Right atrial size was  mild to moderately dilated.  5. The mitral valve is abnormal. No evidence of mitral valve regurgitation. No evidence of mitral stenosis. Moderate to severe mitral annular calcification.  6. The aortic valve is tricuspid. Aortic valve regurgitation is mild. No aortic stenosis is present.  7. The inferior vena cava is dilated in size with >50%  respiratory variability, suggesting right atrial pressure of 8 mmHg. Comparison(s): Changes from prior study are noted. AR is moderate on prior study. FINDINGS  Left Ventricle: Left ventricular ejection fraction, by estimation, is 60 to 65%. The left ventricle has normal function. The left ventricle has no regional wall motion abnormalities. Strain was performed and the global longitudinal strain is indeterminate. The left ventricular internal cavity size was normal in size. There is moderate asymmetric left ventricular hypertrophy of the basal and septal segments. Left ventricular diastolic function could not be evaluated due to atrial fibrillation. Left ventricular diastolic function could not be evaluated. Right Ventricle: The right ventricular size is normal. No increase in right ventricular wall thickness. Right ventricular systolic function is normal. Tricuspid regurgitation signal is inadequate for assessing PA pressure. Left Atrium: Left atrial size was severely dilated. Right Atrium: Right atrial size was mild to moderately dilated. Pericardium: There is no evidence of pericardial effusion. Mitral Valve: The mitral valve is abnormal. Moderate to severe mitral annular calcification. No evidence of mitral valve regurgitation. No evidence of mitral valve stenosis. Tricuspid Valve: The tricuspid valve is normal in structure. Tricuspid valve regurgitation is mild . No evidence of tricuspid stenosis. Aortic Valve: The aortic valve is tricuspid. Aortic valve regurgitation is mild. No aortic stenosis is present. Pulmonic Valve: The pulmonic valve was grossly normal. Pulmonic  valve regurgitation is not visualized. No evidence of pulmonic stenosis. Aorta: The aortic root and ascending aorta are structurally normal, with no evidence of dilitation. Venous: The inferior vena cava is dilated in size with greater than 50% respiratory variability, suggesting right atrial pressure of 8 mmHg. IAS/Shunts: The interatrial septum was not well visualized. Additional Comments: 3D was performed not requiring image post processing on an independent workstation and was indeterminate.  LEFT VENTRICLE PLAX 2D LVIDd:         3.80 cm   Diastology LVIDs:         2.50 cm   LV e' medial:    4.57 cm/s LV PW:         1.00 cm   LV E/e' medial:  26.8 LV IVS:        1.10 cm   LV e' lateral:   8.27 cm/s LVOT diam:     1.80 cm   LV E/e' lateral: 14.8 LV SV:         58 LV SV Index:   37 LVOT Area:     2.54 cm  RIGHT VENTRICLE            IVC RV S prime:     7.83 cm/s  IVC diam: 2.10 cm TAPSE (M-mode): 1.3 cm LEFT ATRIUM             Index        RIGHT ATRIUM           Index LA Vol (A2C):   79.0 ml 50.95 ml/m  RA Area:     17.50 cm LA Vol (A4C):   69.2 ml 44.63 ml/m  RA Volume:   48.70 ml  31.41 ml/m LA Biplane Vol: 80.0 ml 51.60 ml/m  AORTIC VALVE LVOT Vmax:   100.00 cm/s LVOT Vmean:  69.500 cm/s LVOT VTI:    0.227 m  AORTA Ao Root diam: 2.50 cm Ao Asc diam:  3.00 cm MITRAL VALVE MV Area (PHT): 4.21 cm     SHUNTS MV Decel Time: 180 msec     Systemic VTI:  0.23 m MV E velocity: 122.50 cm/s  Systemic Diam: 1.80 cm MV A velocity: 104.00 cm/s MV E/A ratio:  1.18 Vishnu Priya Mallipeddi Electronically signed by Diannah Late Mallipeddi Signature Date/Time: 04/09/2024/4:07:35 PM    Final    CT Angio Chest PE W and/or Wo Contrast Result Date: 04/09/2024 CLINICAL DATA:  MC-CTPulmonary embolism (PE) suspected, high prob history of lung cancer. Sternal chest pain LEFT shoulder pain. * Tracking Code: BO * EXAM: CT ANGIOGRAPHY CHEST WITH CONTRAST TECHNIQUE: Multidetector CT imaging of the chest was performed using the standard  protocol during bolus administration of intravenous contrast. Multiplanar CT image reconstructions and MIPs were obtained to evaluate the vascular anatomy. RADIATION DOSE REDUCTION: This exam was performed according to the departmental dose-optimization program which includes automated exposure control, adjustment of the mA and/or kV according to patient size and/or use of iterative reconstruction technique. CONTRAST:  60mL OMNIPAQUE  IOHEXOL  350 MG/ML SOLN COMPARISON:  CT chest 03/25/2024 FINDINGS: Cardiovascular: No filling defects within the pulmonary arteries to suggest acute pulmonary embolism. No significant vascular findings. Normal heart size. No pericardial effusion. Mediastinum/Nodes: No axillary or supraclavicular adenopathy. No mediastinal or hilar adenopathy. No pericardial fluid. Esophagus normal. Lungs/Pleura: Bilateral pleural effusions similar prior. Moderate effusion on the RIGHT and small on the LEFT. There is passive atelectasis of the RIGHT lower lobe. Small subcentimeter nodules in the lung base not changed from recent CT. Increased consolidation at the LEFT lung base (image 259/series 7 and 90/series 6. Upper Abdomen: Hepatic metastasis not well appreciated no acute findings Musculoskeletal: Sclerotic skeletal metastasis again noted. No change. Surgical clips in the lateral RIGHT breast Review of the MIP images confirms the above findings. IMPRESSION: 1. No evidence acute pulmonary embolism. 2. Bilateral pleural effusions with passive atelectasis of the RIGHT lower lobe. 3. Increased consolidation at the LEFT lung base. Concern for LEFT lower lobe pneumonia. 4. Stable small pulmonary nodules at the lung bases. 5. Stable skeletal metastasis. Electronically Signed   By: Jackquline Boxer M.D.   On: 04/09/2024 11:23   DG Chest Port 1 View Result Date: 04/09/2024 CLINICAL DATA:  Chest pain EXAM: PORTABLE CHEST 1 VIEW COMPARISON:  01/01/2024 FINDINGS: Low volume film. The cardio pericardial  silhouette is enlarged. Vascular congestion without overt airspace pulmonary edema. Bibasilar atelectasis/infiltrate is similar in the interval with small bilateral pleural effusions. No acute bony abnormality. Telemetry leads overlie the chest. IMPRESSION: 1. Low volume film with vascular congestion and small bilateral pleural effusions. 2. Bibasilar atelectasis/infiltrate is similar in the interval. Electronically Signed   By: Camellia Candle M.D.   On: 04/09/2024 08:57     PHYSICAL EXAM  Temp:  [98 F (36.7 C)-98.4 F (36.9 C)] 98.1 F (36.7 C) (07/20 1242) Pulse Rate:  [77-99] 93 (07/20 1242) Resp:  [18-20] 19 (07/20 1242) BP: (156-203)/(60-92) 159/92 (07/20 1242) SpO2:  [92 %-98 %] 98 % (07/20 1242) Weight:  [52.3 kg] 52.3 kg (07/20 0500)  General - Well nourished, well developed, in no apparent distress, mildly lethargic.  Ophthalmologic - fundi not visualized due to noncooperation.  Cardiovascular - irregularly irregular heart rate and rhythm.  Mental Status -  Level of arousal and orientation to time, place, and person were intact. Language including expression, naming, repetition, comprehension was assessed and found intact Fund of Knowledge was assessed and was intact.  Cranial Nerves II - XII - II - Visual field intact OU. III, IV, VI - Extraocular movements intact. V - Facial sensation intact bilaterally. VII - Facial movement intact bilaterally. VIII - Hearing & vestibular intact bilaterally. X -  Palate elevates symmetrically. XI - Chin turning & shoulder shrug intact bilaterally. XII - Tongue protrusion intact.  Motor Strength - The patient's strength was 4/5 in all extremities and pronator drift was absent.  Bulk was normal and fasciculations were absent.   Motor Tone - Muscle tone was assessed at the neck and appendages and was normal.  Reflexes - The patient's reflexes were symmetrical in all extremities and she had no pathological reflexes.  Sensory - Light  touch, temperature/pinprick were assessed and were symmetrical.    Coordination - The patient had normal movements in the hands with no ataxia or dysmetria although slow.  Tremor was absent.  Gait and Station - deferred.   ASSESSMENT/PLAN Ms. DEWANA AMMIRATI is a 89 y.o. female with history of CKD, CAD, HTN, HLD, afib not on AC, cCHF, metastatic breast cancer and ovarian cancer admitted for AKI on CKD, hyperkalemia. S/p dialysis but found to have R arm weakness. No TNK given due to outside window.    Stroke:  left frontal small infarct embolic secondary to Afib not on AC CT no acute finding MRI  Small area of early subacute/acute ischemia within the posterior left frontal white matter. MRA  Intracranial atherosclerosis, but mild for age overall. Mild to moderate irregularity and stenosis of the left MCA M1 and right PCA P2 segments. Carotid Doppler unremarkable 2D Echo  EF 65-70% LDL 20 HgbA1c 6.1 eliquis  for VTE prophylaxis aspirin  81 mg daily prior to admission, now on Eliquis  (apixaban ) daily.  Patient counseled to be compliant with her antithrombotic medications Ongoing aggressive stroke risk factor management Therapy recommendations:  HH PT OT Disposition:  pending  Persistent AF Follows with Dr. Jeffrie  Not on [facility] due to frailty and anemia  Afib RVR - on cardizem  and metoprolol  Rate controlled now Put on eliquis  today  Hypertension Stable On metoprolol  and imdur  Long term BP goal normotensive  Hyperlipidemia Home meds:  crestor  10  LDL 20, goal < 70 AST ALT 883/895 -> 33/47 Reduce crestor  to 5 No high intensity statin given LDL at goal  Continue statin at discharge  Other Stroke Risk Factors Advanced age dCHF on imdur  CAD  Other Active Problems AKI on CKD, Cre 3.01--3.02--1.58--1.72 Hyperkalemia K 6.5--dialysis --3.8--2.8 - supplement Leukocytosis WBC 16.1--12.1--6.1 Anemia due to chronic disease CKD, Hb 8.8--9.1--9.2  Hospital day # 3  Neurology will  sign off. Please call with questions. Pt will follow up with stroke clinic NP at [facility] in about 4 weeks. Thanks for the consult.   Ary Cummins, MD PhD Stroke Neurology 04/25/2024 1:12 PM    To contact Stroke Continuity provider, please refer to [email or web address]. After hours, contact General Neurology

## 2024-04-25 NOTE — Plan of Care (Signed)
  Problem: Clinical Measurements: Goal: Diagnostic test results will improve Outcome: Progressing Goal: Respiratory complications will improve Outcome: Progressing Goal: Cardiovascular complication will be avoided Outcome: Progressing   Problem: Nutrition: Goal: Adequate nutrition will be maintained Outcome: Progressing   Problem: Safety: Goal: Ability to remain free from injury will improve Outcome: Progressing

## 2024-04-25 NOTE — Progress Notes (Signed)
 VASCULAR LAB    Carotid duplex has been performed.  See CV proc for preliminary results.   Artrice Kraker, RVT 04/25/2024, 5:42 PM

## 2024-04-25 NOTE — Progress Notes (Signed)
 IV Therapy received a consult to remove the patient's central line (hemo cath);  pt had a PIV that would not flush, and was removed; pt remains on an antibiotic, and lasix , and has labs ordered for AM;  upon assessing pt's arms, she has multiple bruises, some skin tears, poor peripheral access; pr requesting to keep the [facility];  secure chats were sent to Dr Raenelle and Dr Dolan; will keep [facility] with pigtail for now so pt has good IV access; site wnl, dressing CDI;  RN aware.

## 2024-04-25 NOTE — Progress Notes (Signed)
 [facility] NEPHROLOGY PROGRESS NOTE  Assessment/ Plan: Pt is a 89 y.o. yo female with past medical history significant for metastatic breast and ovarian cancer, chronic diastolic CHF, CKD followed by me, CAD, anemia who was sent to the ER from nephrology clinic for shortness of breath, bradycardia/hypotension in the setting of hyperkalemia and CHF.  # Hyperkalemia severe with bradycardia: status post urgent dialysis on admission.  Potassium level improved after dialysis now she is hypokalemic requiring potassium chloride  repletion.  Monitor lab.  # Acute kidney injury on CKD 3/4: Recent worsening creatinine level in the setting of CHF exacerbation/fluid overload.  Required dialysis for hyperkalemia.  Urine output is marginal.  She is quite short of breath today therefore ordered another dose of IV Lasix .  Concerned about underlying pleural effusion as well discussed with the primary team. Ok to remove temporary HD catheter.  # Acute on chronic CHF with preserved EF: Pleural effusion and fluid overload on exam.  Echo with EF of 65 to 70%.  Getting echo.  Treating with IV diuretics.  # Anemia: Hemoglobin improving.  Monitor lab data and transfuse as needed.  # Metastatic breast and ovarian cancer.  Patient is DNR.  Following with oncologist.  I spoke with patient's daughter and updated.  # Acute ischemic stroke, embolic: Patient with history of A-fib, starting Eliquis  after consultation with cardiologist.  Seen by neurologist as well.  Discussed with primary team and updated her daughter on 7/18.  Subjective: Seen and examined.  Urine output is recorded only 500 cc.  Reports mild shortness of breath and fatigue.  No other new event overnight. Objective Vital signs in last 24 hours: Vitals:   04/25/24 0431 04/25/24 0500 04/25/24 0514 04/25/24 0747  BP: (!) 165/74  (!) 165/74 (!) 179/88  Pulse: 94   94  Resp: 19   19  Temp: 98.1 F (36.7 C)   98.4 F (36.9 C)  TempSrc:  Oral   Oral  SpO2: 98%   95%  Weight:  52.3 kg     Weight change:   Intake/Output Summary (Last 24 hours) at 04/25/2024 1020 Last data filed at 04/24/2024 2136 Gross per 24 hour  Intake 250 ml  Output 500 ml  Net -250 ml       Labs: RENAL PANEL Recent Labs  Lab 04/22/24 1041 04/22/24 1131 04/22/24 1612 04/22/24 1624 04/22/24 1708 04/22/24 2046 04/23/24 0416 04/24/24 1016 04/24/24 1030 04/25/24 0308  NA 137   < > 135   < > 136 138 136 136  --  136  K 6.5*   < > 7.4*   < > 6.6* 6.5* 3.8 3.3*  --  2.8*  CL 108   < > 110   < > 113* 112* 98 93*  --  96*  CO2 15*   < > 12*  --   --  14* 25 30  --  30  GLUCOSE 131*   < > 259*   < > 291* 160* 102* 120*  --  103*  BUN 45*   < > 46*   < > 43* 46* 17 23  --  25*  CREATININE 2.98*   < > 3.09*   < > 3.10* 3.02* 1.58* 1.72*  --  1.72*  CALCIUM  8.4*   < > 8.0*  --   --  8.8* 8.6* 7.8*  --  8.0*  MG 1.1*  --  1.1*  --   --   --  2.0  --   --   --  PHOS  --   --   --   --   --  6.0* 3.8 3.8  --   --   ALBUMIN  3.5  --  2.5*  --   --  2.5* 2.7* 2.2* 2.3* 2.3*   < > = values in this interval not displayed.    Liver Function Tests: Recent Labs  Lab 04/23/24 0416 04/24/24 1016 04/24/24 1030 04/25/24 0308  AST 116*  --  44* 33  ALT 104*  --  59* 47*  ALKPHOS 200*  --  153* 136*  BILITOT 0.7  --  0.6 0.9  PROT 5.8*  --  5.2* 5.3*  ALBUMIN  2.7* 2.2* 2.3* 2.3*   No results for input(s): LIPASE, AMYLASE in the last 168 hours. Recent Labs  Lab 04/22/24 1947  AMMONIA 24   CBC: Recent Labs    12/24/23 0231 12/25/23 0312 04/22/24 1624 04/22/24 1708 04/22/24 2046 04/23/24 0416 04/25/24 0308  HGB 7.4*   < > 9.2* 7.8* 8.8* 9.1* 9.2*  MCV 104.0*   < >  --   --  96.0 89.1 91.3  VITAMINB12 1,125*  --   --   --   --   --   --   FOLATE 28.0  --   --   --   --   --   --   FERRITIN 323*  --   --   --   --   --   --   TIBC 225*  --   --   --   --   --   --   IRON 66  --   --   --   --   --   --    < > = values in this  interval not displayed.    Cardiac Enzymes: No results for input(s): CKTOTAL, CKMB, CKMBINDEX, TROPONINI in the last 168 hours. CBG: Recent Labs  Lab 04/24/24 1658 04/24/24 1943 04/24/24 2336 04/25/24 0325 04/25/24 0745  GLUCAP 125* 164* 130* 92 91    Iron Studies: No results for input(s): IRON, TIBC, TRANSFERRIN, FERRITIN in the last 72 hours. Studies/Results: DG Chest Port 1V same Day Result Date: 04/24/2024 CLINICAL DATA:  141880 SOB (shortness of breath) 141880 EXAM: PORTABLE CHEST - 1 VIEW SAME DAY COMPARISON:  CT 04/22/2024 and previous FINDINGS: Left IJ HD catheter to the distal SVC. No pneumothorax. Moderate pulmonary vascular congestion. Pleural effusions right greater than left with some atelectasis/consolidation at the lung bases. Heart size and mediastinal contours are within normal limits. Aortic Atherosclerosis (ICD10-170.0). Regional bones unremarkable. IMPRESSION: Pulmonary vascular congestion with bilateral pleural effusions and basilar atelectasis/consolidation. Electronically Signed   By: JONETTA Faes M.D.   On: 04/24/2024 13:18   MR ANGIO HEAD WO CONTRAST Result Date: 04/24/2024 CLINICAL DATA:  89 year old female code stroke presentation, neurologic deficit. Small area of patchy left centrum semiovale ischemia on brain MRI last night. EXAM: MRA HEAD WITHOUT CONTRAST TECHNIQUE: Angiographic images of the Circle of Willis were acquired using MRA technique without intravenous contrast. COMPARISON:  Brain MRI 04/23/2024. FINDINGS: Anterior circulation: Antegrade flow in both ICA siphons. Left-side persistent trigeminal artery. No ICA siphon stenosis. Patent carotid termini. Dominant left and diminutive or absent right ACA A1 segment. Normal anterior communicating artery. Visible bilateral ACA branches are within normal limits. MCA M1 segments are tortuous, more so the left. There is mild to moderate left MCA M1 segment irregularity and stenosis on series 1020 image  12. MCA bifurcations are patent and bilateral  MCA branches are within normal limits. Posterior circulation: Antegrade flow in the posterior circulation with dominant appearing distal right vertebral artery. Diminutive distal left vertebral artery appears to terminates in PICA. Right PICA appears patent. Patent basilar artery without stenosis. And furthermore there is a left-sided persistent trigeminal artery (series 5, image 108), also contributing to the distal basilar. Normal SCA and left PCA origins. Fetal type right PCA origin. Left posterior communicating artery diminutive or absent. Left PCA branches are within normal limits. There is mild to moderate irregularity and stenosis of the right PCA P2 segment broadly (series 1032, image 9). Distal right PCA branches remain patent. Anatomic variants: Persistent left side trigeminal artery. Dominant right vertebral, left vertebral terminates in PICA. Fetal right PCA origin. Dominant left and diminutive or absent right ACA A1 segments. Other: No intracranial mass effect or ventriculomegaly. IMPRESSION: 1. Negative for large vessel occlusion. 2. Intracranial atherosclerosis, but mild for age overall. Mild to moderate irregularity and stenosis of the left MCA M1 and right PCA P2 segments. 3. Multiple vascular anatomic variations, including persistent Left side Trigeminal Artery. Electronically Signed   By: VEAR Hurst M.D.   On: 04/24/2024 07:07   MR BRAIN WO CONTRAST Result Date: 04/23/2024 EXAM: MRI BRAIN WITHOUT CONTRAST 04/23/2024 09:10:11 PM TECHNIQUE: Multiplanar multisequence MRI of the head/brain was performed without the administration of intravenous contrast. COMPARISON: None available. CLINICAL HISTORY: Stroke, follow up. FINDINGS: BRAIN AND VENTRICLES: Small area of early subacute or acute ischemia within the posterior left frontal white matter. Bilateral early confluent hyperintense T2-weighted signal within the cerebral white matter. No intracranial  hemorrhage. No mass. No midline shift. No hydrocephalus. The sella is unremarkable. Normal flow voids. ORBITS: No acute abnormality. SINUSES AND MASTOIDS: No acute abnormality. BONES AND SOFT TISSUES: Normal marrow signal. No acute soft tissue abnormality. IMPRESSION: 1. Small area of early subacute/acute ischemia within the posterior left frontal white matter. 2. Bilateral early confluent hyperintense T2-weighted signal within the cerebral white matter, most commonly due to chronic small vessel disease. Electronically signed by: Franky Stanford MD 04/23/2024 09:57 PM EDT RP Workstation: [id number]   ECHOCARDIOGRAM COMPLETE Result Date: 04/23/2024    ECHOCARDIOGRAM REPORT   Patient Name:   [name] Date of Exam: 04/23/2024 Medical Rec #:  [id number]        Height:       66.0 in Accession #:    [id number]       Weight:       112.9 lb Date of Birth:  [birth year]       BSA:          1.568 m Patient Age:    89 years         BP:           172/65 mmHg Patient Gender: F                HR:           84 bpm. Exam Location:  Inpatient Procedure: 2D Echo, Cardiac Doppler and Color Doppler (Both Spectral and Color            Flow Doppler were utilized during procedure). Indications:    Acute Renal Failure  History:        Patient has prior history of Echocardiogram examinations. Renal                 Failure.  Sonographer:    Vella Key Referring Phys: [phone] MATTHEW R HUNSUCKER IMPRESSIONS  1. Left ventricular  ejection fraction, by estimation, is 65 to 70%. The left ventricle has normal function. The left ventricle has no regional wall motion abnormalities. There is mild left ventricular hypertrophy. Left ventricular diastolic parameters were normal.  2. Right ventricular systolic function is normal. The right ventricular size is mildly enlarged.  3. Left atrial size was moderately dilated.  4. Mild mitral valve regurgitation.  5. The aortic valve is tricuspid. Aortic valve regurgitation is not visualized. Aortic valve  sclerosis/calcification is present, without any evidence of aortic stenosis. FINDINGS  Left Ventricle: Left ventricular ejection fraction, by estimation, is 65 to 70%. The left ventricle has normal function. The left ventricle has no regional wall motion abnormalities. The left ventricular internal cavity size was small. There is mild left ventricular hypertrophy. Left ventricular diastolic parameters were normal. Right Ventricle: The right ventricular size is mildly enlarged. Right vetricular wall thickness was not assessed. Right ventricular systolic function is normal. Left Atrium: Left atrial size was moderately dilated. Right Atrium: Right atrial size was normal in size. Pericardium: There is no evidence of pericardial effusion. Mitral Valve: There is mild thickening of the mitral valve leaflet(s). Mild to moderate mitral annular calcification. Mild mitral valve regurgitation. Tricuspid Valve: The tricuspid valve is normal in structure. Tricuspid valve regurgitation is trivial. Aortic Valve: The aortic valve is tricuspid. Aortic valve regurgitation is not visualized. Aortic valve sclerosis/calcification is present, without any evidence of aortic stenosis. Pulmonic Valve: The pulmonic valve was normal in structure. Pulmonic valve regurgitation is trivial. Aorta: The aortic root and ascending aorta are structurally normal, with no evidence of dilitation. IAS/Shunts: No atrial level shunt detected by color flow Doppler.  LEFT VENTRICLE PLAX 2D LVIDd:         3.10 cm     Diastology LVIDs:         1.80 cm     LV e' medial:    12.80 cm/s LV PW:         1.30 cm     LV E/e' medial:  15.1 LV IVS:        1.10 cm     LV e' lateral:   13.50 cm/s LVOT diam:     1.70 cm     LV E/e' lateral: 14.3 LV SV:         70 LV SV Index:   45 LVOT Area:     2.27 cm  LV Volumes (MOD) LV vol d, MOD A2C: 58.4 ml LV vol d, MOD A4C: 83.9 ml LV vol s, MOD A2C: 15.4 ml LV vol s, MOD A4C: 32.8 ml LV SV MOD A2C:     43.0 ml LV SV MOD A4C:      83.9 ml LV SV MOD BP:      46.6 ml RIGHT VENTRICLE RV Basal diam:  3.90 cm RV S prime:     14.30 cm/s TAPSE (M-mode): 1.5 cm LEFT ATRIUM             Index        RIGHT ATRIUM           Index LA diam:        4.80 cm 3.06 cm/m   RA Area:     12.00 cm LA Vol (A2C):   68.5 ml 43.68 ml/m  RA Volume:   28.80 ml  18.36 ml/m LA Vol (A4C):   63.3 ml 40.36 ml/m LA Biplane Vol: 66.4 ml 42.34 ml/m  AORTIC VALVE LVOT Vmax:   135.00 cm/s LVOT Vmean:  105.000 cm/s LVOT VTI:    0.309 m  AORTA Ao Root diam: 2.80 cm Ao Asc diam:  2.80 cm MITRAL VALVE MV Area (PHT): 5.42 cm     SHUNTS MV Decel Time: 140 msec     Systemic VTI:  0.31 m MV E velocity: 193.00 cm/s  Systemic Diam: 1.70 cm MV A velocity: 64.50 cm/s MV E/A ratio:  2.99 Vina Gull MD Electronically signed by Vina Gull MD Signature Date/Time: 04/23/2024/3:01:59 PM    Final    CT HEAD CODE STROKE WO CONTRAST Result Date: 04/23/2024 CLINICAL DATA:  Code stroke. Neuro deficit, acute, stroke suspected. Right arm weakness. EXAM: CT HEAD WITHOUT CONTRAST TECHNIQUE: Contiguous axial images were obtained from the base of the skull through the vertex without intravenous contrast. RADIATION DOSE REDUCTION: This exam was performed according to the departmental dose-optimization program which includes automated exposure control, adjustment of the mA and/or kV according to patient size and/or use of iterative reconstruction technique. COMPARISON:  Head CT 04/21/2008. FINDINGS: Brain: Generalized cerebral atrophy. Patchy and ill-defined hypoattenuation within the cerebral white matter, nonspecific but compatible with moderate to advanced chronic small vessel ischemic disease. There is no acute intracranial hemorrhage. No demarcated cortical infarct. No extra-axial fluid collection. No evidence of an intracranial mass. No midline shift. Vascular: No hyperdense vessel.  Atherosclerotic calcifications. Skull: No calvarial fracture or aggressive osseous lesion. Sinuses/Orbits: No mass  or acute finding within the imaged orbits. No significant paranasal sinus disease. ASPECTS [facility] Stroke Program Early CT Score) - Ganglionic level infarction (caudate, lentiform nuclei, internal capsule, insula, M1-M3 cortex): 7 - Supraganglionic infarction (M4-M6 cortex): 3 Total score (0-10 with 10 being normal): 10 No evidence of an acute intracranial abnormality. These results were communicated to Dr. Matthews At 1:39 pmon 7/18/2025by text page via the [facility] messaging system. IMPRESSION: 1. No evidence of an acute intracranial abnormality. 2. Parenchymal atrophy and chronic small vessel ischemic disease, progressed from the prior head CT of 04/21/2008. Electronically Signed   By: Rockey Childs D.O.   On: 04/23/2024 13:39    Medications: Infusions:  ampicillin -sulbactam (UNASYN ) IV     anticoagulant sodium citrate      potassium chloride  10 mEq (04/25/24 0923)    Scheduled Medications:  apixaban   2.5 mg Oral BID   azithromycin   500 mg Oral Daily   Chlorhexidine  Gluconate Cloth  6 each Topical Q0600   diltiazem  (CARDIZEM  CD) 24 hr capsule 360 mg  360 mg Oral Daily   guaiFENesin   600 mg Oral BID   isosorbide  mononitrate  60 mg Oral Daily   metoprolol  tartrate  75 mg Oral BID   rosuvastatin   10 mg Oral QHS   sodium chloride  flush  10-40 mL Intracatheter Q12H    have reviewed scheduled and prn medications.  Physical Exam: General: Elderly frail and ill looking female Heart:RRR, s1s2 nl Lungs: Bibasilar reduced breath sound. Abdomen:soft, Non-tender, non-distended Extremities: Bilateral leg pitting edema much better Neurology: Alert, awake and following commands  Alexx Giambra Prasad Dempsy Damiano 04/25/2024,10:20 AM  LOS: 3 days

## 2024-04-26 ENCOUNTER — Other Ambulatory Visit (HOSPITAL_COMMUNITY): Payer: Self-pay

## 2024-04-26 ENCOUNTER — Telehealth (HOSPITAL_COMMUNITY): Payer: Self-pay | Admitting: Pharmacy Technician

## 2024-04-26 DIAGNOSIS — I10 Essential (primary) hypertension: Secondary | ICD-10-CM | POA: Diagnosis not present

## 2024-04-26 DIAGNOSIS — N179 Acute kidney failure, unspecified: Secondary | ICD-10-CM | POA: Diagnosis not present

## 2024-04-26 DIAGNOSIS — E875 Hyperkalemia: Secondary | ICD-10-CM | POA: Diagnosis not present

## 2024-04-26 DIAGNOSIS — I482 Chronic atrial fibrillation, unspecified: Secondary | ICD-10-CM | POA: Diagnosis not present

## 2024-04-26 LAB — COMPREHENSIVE METABOLIC PANEL WITH GFR
ALT: 36 U/L (ref 0–44)
AST: 26 U/L (ref 15–41)
Albumin: 2.3 g/dL — ABNORMAL LOW (ref 3.5–5.0)
Alkaline Phosphatase: 118 U/L (ref 38–126)
Anion gap: 12 (ref 5–15)
BUN: 22 mg/dL (ref 8–23)
CO2: 30 mmol/L (ref 22–32)
Calcium: 8.2 mg/dL — ABNORMAL LOW (ref 8.9–10.3)
Chloride: 97 mmol/L — ABNORMAL LOW (ref 98–111)
Creatinine, Ser: 1.51 mg/dL — ABNORMAL HIGH (ref 0.44–1.00)
GFR, Estimated: 33 mL/min — ABNORMAL LOW (ref >60)
Glucose, Bld: 124 mg/dL — ABNORMAL HIGH (ref 70–99)
Potassium: 2.8 mmol/L — ABNORMAL LOW (ref 3.5–5.1)
Sodium: 139 mmol/L (ref 135–145)
Total Bilirubin: 0.9 mg/dL (ref 0.0–1.2)
Total Protein: 5.5 g/dL — ABNORMAL LOW (ref 6.5–8.1)

## 2024-04-26 MED ORDER — POTASSIUM CHLORIDE 10 MEQ/100ML IV SOLN
10.0000 meq | INTRAVENOUS | Status: AC
  Administered 2024-04-26 (×4): 10 meq via INTRAVENOUS
  Filled 2024-04-26 (×4): qty 100

## 2024-04-26 MED ORDER — METOPROLOL TARTRATE 100 MG PO TABS
100.0000 mg | ORAL_TABLET | Freq: Two times a day (BID) | ORAL | Status: DC
  Administered 2024-04-26 – 2024-04-30 (×9): 100 mg via ORAL
  Filled 2024-04-26 (×9): qty 1

## 2024-04-26 MED ORDER — HYDRALAZINE HCL 25 MG PO TABS
25.0000 mg | ORAL_TABLET | Freq: Three times a day (TID) | ORAL | Status: DC
  Administered 2024-04-26 – 2024-04-30 (×12): 25 mg via ORAL
  Filled 2024-04-26 (×12): qty 1

## 2024-04-26 MED ORDER — POTASSIUM CHLORIDE CRYS ER 20 MEQ PO TBCR: 40.0000 meq | EXTENDED_RELEASE_TABLET | Freq: Once | ORAL | Status: DC

## 2024-04-26 MED ORDER — POTASSIUM CHLORIDE CRYS ER 20 MEQ PO TBCR
40.0000 meq | EXTENDED_RELEASE_TABLET | Freq: Three times a day (TID) | ORAL | Status: AC
  Administered 2024-04-26 (×3): 40 meq via ORAL
  Filled 2024-04-26 (×3): qty 2

## 2024-04-26 NOTE — TOC Progression Note (Signed)
 Transition of Care [facility]) - Progression Note    Patient Details  Name: [name] MRN: [id number] Date of Birth: 1933-12-23  [facility] CM/SW Contact  Robynn Eileen Hoose, RN Phone Number: 04/26/2024, 1:49 PM  Clinical Narrative:   Progression rounds discussion; possible discharge in 1 day. Patient also being followed by [facility] for palliative services.    Expected Discharge Plan: Home w Home Health Services Barriers to Discharge: Continued Medical Work up  Expected Discharge Plan and Services       Living arrangements for the past 2 months: Single Family Home                                       Social Determinants of Health (SDOH) Interventions SDOH Screenings   Food Insecurity: Patient Declined (04/26/2024)  Housing: Unknown (04/26/2024)  Transportation Needs: Patient Declined (04/26/2024)  Utilities: Patient Declined (04/26/2024)  Depression (PHQ2-9): Low Risk  (04/22/2024)  Social Connections: Patient Declined (04/26/2024)  Recent Concern: Social Connections - Moderately Isolated (04/09/2024)  Tobacco Use: Low Risk  (04/22/2024)    Readmission Risk Interventions    04/12/2024   11:59 AM 12/09/2023   12:21 PM 11/23/2023   11:56 AM  Readmission Risk Prevention Plan  Transportation Screening Complete Complete Complete  PCP or Specialist Appt within 5-7 Days   Complete  Home Care Screening   Complete  Medication Review (RN CM)   Complete  Medication Review (RN Care Manager) Referral to Pharmacy    HRI or Home Care Consult Complete    SW Recovery Care/Counseling Consult Complete    Palliative Care Screening Not Applicable    Skilled Nursing Facility Not Applicable Complete

## 2024-04-26 NOTE — Plan of Care (Signed)

## 2024-04-26 NOTE — Telephone Encounter (Signed)
 Pharmacy Patient Advocate Encounter  Insurance verification completed.    The patient is insured through [employer].     Ran test claim for Eliquis  and the current 30 day co-pay is $43.00.   This test claim was processed through [facility]- copay amounts may vary at other pharmacies due to pharmacy/plan contracts, or as the patient moves through the different stages of their insurance plan.

## 2024-04-26 NOTE — Progress Notes (Signed)
 Patient ID: [name], female   DOB: 05/26/1934, 89 y.o.   MRN: [id number] [facility] Progress Note   Assessment/ Plan:   1. Acute kidney Injury on chronic kidney disease stage IIIb: Suspected hemodynamically mediated with need for dialysis secondary to hyperkalemia.  Now with improving urine output/renal function seen on labs without indication to undertake additional dialysis. 2.  Hypokalemia: This was after initial management of her hyperkalemia upon admission.  She has chronic diarrhea and an element of malabsorption that could be posing a difficulty for potassium replacement.  On IV/p.o. potassium and will get recheck labs this afternoon.  Previous magnesium  appears adequate and recheck level pending. 3.  Bilateral pneumonia: On antimicrobial therapy with Unasyn  and azithromycin .  Still hypoxic and requiring oxygen supplementation via nasal cannula.  She has bilateral pleural effusions that are suspected to be transudative in the setting of congestive heart failure. 4.  History of metastatic breast cancer: Ongoing outpatient follow-up/management by oncology. 5.  Hypertension: Elevated blood pressure noted overnight with corresponding up titration/medication changes undertaken by Dr. Raenelle earlier today.  Subjective:   Reports that she walked around yesterday with PT without problems.  Having intermittent cough   Objective:   BP (!) 192/74 (BP Location: Right Arm)   Pulse 100   Temp (!) 97.3 F (36.3 C) (Oral)   Resp 20   Wt 49.7 kg   LMP 10/07/1974   SpO2 95%   BMI 17.68 kg/m   Intake/Output Summary (Last 24 hours) at 04/26/2024 0941 Last data filed at 04/26/2024 0600 Gross per 24 hour  Intake 240 ml  Output 1300 ml  Net -1060 ml   Weight change: -2.6 kg  Physical Exam: Gen: Chronically ill-appearing woman, resting comfortably in recliner CVS: Pulse regular tachycardia, S1 and S2 normal.  Temporary left IJ HD catheter Resp: Coarse breath sounds  bilaterally with some rales over bases Abd: Soft, flat, nontender, bowel sounds normal Ext: No lower extremity edema  Imaging: VAS US  CAROTID (at [address] [facility] and [facility] only) Result Date: 04/25/2024 Carotid Arterial Duplex Study Patient Name:  [name]  Date of Exam:   04/25/2024 Medical Rec #: [id number]         Accession #:    [id number] Date of Birth: 08/19/1934        Patient Gender: F Patient Age:   49 years Exam Location:  [facility] Procedure:      VAS US  CAROTID Referring Phys: EARLE DE LA TORRE --------------------------------------------------------------------------------  Indications:       Carotid artery disease and acute onset right arm and leg                    weakness. Risk Factors:      Hypertension, hyperlipidemia, coronary artery disease. Other Factors:     CKD stage IIIb, metastatic breast cancer, ovarian cancer,                    A-fib. Limitations        Today's exam was limited due to Line and bandages in left                    neck. Comparison Study:  Prior carotid duplex done 03/14/2022 indicating bilateral 1-39%                    ICA stenosis. Performing Technologist: Eleanor Lincoln  Examination Guidelines: A complete evaluation includes B-mode imaging, spectral Doppler, color Doppler, and  power Doppler as needed of all accessible portions of each vessel. Bilateral testing is considered an integral part of a complete examination. Limited examinations for reoccurring indications may be performed as noted.  Right Carotid Findings: +----------+--------+--------+--------+------------------+------------------+           PSV cm/sEDV cm/sStenosisPlaque DescriptionComments           +----------+--------+--------+--------+------------------+------------------+ CCA Prox  140     17                                intimal thickening +----------+--------+--------+--------+------------------+------------------+ CCA Distal122     11              calcific                              +----------+--------+--------+--------+------------------+------------------+ ICA Prox  87      16      1-39%   calcific                             +----------+--------+--------+--------+------------------+------------------+ ICA Mid   82      16                                                   +----------+--------+--------+--------+------------------+------------------+ ICA Distal97      20                                                   +----------+--------+--------+--------+------------------+------------------+ ECA       84      8                                                    +----------+--------+--------+--------+------------------+------------------+ +----------+--------+-------+--------+-------------------+           PSV cm/sEDV cmsDescribeArm Pressure (mmHG) +----------+--------+-------+--------+-------------------+ [id number]                                        +----------+--------+-------+--------+-------------------+ +---------+--------+--+--------+--+ VertebralPSV cm/s80EDV cm/s14 +---------+--------+--+--------+--+  Left Carotid Findings: +----------+--------+--------+--------+------------------+---------------------+           PSV cm/sEDV cm/sStenosisPlaque DescriptionComments              +----------+--------+--------+--------+------------------+---------------------+ CCA Prox                                            Proximal CCA is not                                                       visualized.           +----------+--------+--------+--------+------------------+---------------------+  CCA Mid                                             Not visualized        +----------+--------+--------+--------+------------------+---------------------+ CCA Distal277     36              calcific          Shadowing             +----------+--------+--------+--------+------------------+---------------------+  ICA Prox  111     19      1-39%   calcific                                +----------+--------+--------+--------+------------------+---------------------+ ICA Mid   100     24                                                      +----------+--------+--------+--------+------------------+---------------------+ ICA Distal71      21                                                      +----------+--------+--------+--------+------------------+---------------------+ ECA       72      0                                                       +----------+--------+--------+--------+------------------+---------------------+ +----------+--------+--------+--------+-------------------+           PSV cm/sEDV cm/sDescribeArm Pressure (mmHG) +----------+--------+--------+--------+-------------------+ [id number]                                         +----------+--------+--------+--------+-------------------+   Summary: Right Carotid: Velocities in the right ICA are consistent with a 1-39% stenosis. Left Carotid: Velocities in the left ICA are consistent with a 1-39% stenosis. Vertebrals:  Right vertebral artery demonstrates antegrade flow. Unable to              visualize left vertebral secondary to line/bandage. Subclavians: Normal flow hemodynamics were seen in bilateral subclavian              arteries. *See table(s) above for measurements and observations.     Preliminary     Labs: BMET Recent Labs  Lab 04/22/24 1131 04/22/24 1131 04/22/24 1612 04/22/24 1624 04/22/24 1708 04/22/24 2046 04/23/24 0416 04/24/24 1016 04/25/24 0308 04/26/24 0446  NA 138  --  135 135 136 138 136 136 136 139  K 6.5*  --  7.4* 7.4* 6.6* 6.5* 3.8 3.3* 2.8* 2.8*  CL 109  --  110 112* 113* 112* 98 93* 96* 97*  CO2 17*  --  12*  --   --  14* 25 30 30 30   GLUCOSE 128*  --  259* 264* 291* 160* 102* 120*  103* 124*  BUN 44*  --  46* 43* 43* 46* 17 23 25* 22  CREATININE 3.04*   < > 3.09* 3.30*  3.10* 3.02* 1.58* 1.72* 1.72* 1.51*  CALCIUM  8.7*  --  8.0*  --   --  8.8* 8.6* 7.8* 8.0* 8.2*  PHOS  --   --   --   --   --  6.0* 3.8 3.8  --   --    < > = values in this interval not displayed.   CBC Recent Labs  Lab 04/22/24 1041 04/22/24 1612 04/22/24 1624 04/22/24 1708 04/22/24 2046 04/23/24 0416 04/25/24 0308  WBC 11.3* 12.6*  --   --  16.1* 12.1* 6.1  NEUTROABS 9.3* 9.6*  --   --   --   --   --   HGB 9.2* 9.0*   < > 7.8* 8.8* 9.1* 9.2*  HCT 29.5* 29.8*   < > 23.0* 28.7* 27.8* 28.5*  MCV 93.7 96.8  --   --  96.0 89.1 91.3  PLT 228 259  --   --  239 209 213   < > = values in this interval not displayed.    Medications:     apixaban   2.5 mg Oral BID   azithromycin   500 mg Oral Daily   Chlorhexidine  Gluconate Cloth  6 each Topical Q0600   diltiazem  (CARDIZEM  CD) 24 hr capsule 360 mg  360 mg Oral Daily   guaiFENesin   600 mg Oral BID   hydrALAZINE   25 mg Oral Q8H   isosorbide  mononitrate  60 mg Oral Daily   metoprolol  tartrate  100 mg Oral BID   potassium chloride   40 mEq Oral TID   rosuvastatin   5 mg Oral QHS   sodium chloride  flush  10-40 mL Intracatheter Q12H    Gordy Blanch, MD 04/26/2024, 9:41 AM

## 2024-04-26 NOTE — Progress Notes (Signed)
 [id number] liaison note:   This is a current patient with [facility], followed for outpatient palliative care.    [facility] will continue to follow for discharge disposition.   Please call with any hospice or OPP related questions or concerns.   Thank you, Eleanor Nail, LPN [phone]

## 2024-04-26 NOTE — Plan of Care (Signed)
  Problem: Education: Goal: Knowledge of General Education information will improve Description: Including pain rating scale, medication(s)/side effects and non-pharmacologic comfort measures Outcome: Progressing   Problem: Health Behavior/Discharge Planning: Goal: Ability to manage health-related needs will improve Outcome: Progressing   Problem: Clinical Measurements: Goal: Ability to maintain clinical measurements within normal limits will improve Outcome: Progressing Goal: Will remain free from infection Outcome: Progressing Goal: Diagnostic test results will improve Outcome: Progressing   Problem: Education: Goal: Knowledge of disease or condition will improve Outcome: Progressing Goal: Knowledge of secondary prevention will improve (MUST DOCUMENT ALL) Outcome: Progressing Goal: Knowledge of patient specific risk factors will improve (DELETE if not current risk factor) Outcome: Progressing   Problem: Ischemic Stroke/TIA Tissue Perfusion: Goal: Complications of ischemic stroke/TIA will be minimized Outcome: Progressing   Problem: Coping: Goal: Will verbalize positive feelings about self Outcome: Progressing   Problem: Health Behavior/Discharge Planning: Goal: Goals will be collaboratively established with patient/family Outcome: Progressing

## 2024-04-26 NOTE — Progress Notes (Signed)
 PROGRESS NOTE        PATIENT DETAILS Name: [name] Age: 89 y.o. Sex: female Date of Birth: [birth year] Admit Date: 04/22/2024 Admitting Physician Donnice JONELLE Beals, MD ERE:Cjmjijmjgjw, [name], MD  Brief Summary: Patient is a 89 y.o.  female with history of metastatic breast cancer, HTN, CKD stage IIIb, chronic HFrEF-who was sent from nephrology clinic for evaluation of hyperkalemia and AKI.  Significant events: 7/17>> admit to ICU-severe hyperkalemia-underwent emergent HD. 7/18>> RUE weakness-code stroke-MRI brain positive for acute CVA 7/19>> transferred to [facility]  Significant studies: 7/17>> CT renal stone study: No hydronephrosis, B/L lower lobe consolidation/effusion. 7/17>> CT chest: Progressive B/L lower lobe infiltrate with associated effusions. 7/18>> MRI brain: Acute infarct posterior left frontal white matter. 7/18>> TTE: EF 65-70% 7/18>> A1c: 6.1 7/19>> MRA head: No LVO, intracranial atherosclerosis-mild stenosis of left MCA M1, right PCA P1 segments. 7/19>> LDL: 20.  Significant microbiology data: None  Procedures: None  Consults: PCCM Neurology Neurology  Subjective: Coughing is better.  Breathing is better.  Appears comfortable.  Objective: Vitals: Blood pressure (!) 192/74, pulse 100, temperature (!) 97.3 F (36.3 C), temperature source Oral, resp. rate 20, weight 49.7 kg, last menstrual period 10/07/1974, SpO2 95%.   Exam: Awake/alert No Distress Chest: Much better today-bibasilar rales. CVS: S1-S2 irregular Abdomen: Soft nontender Extremities: No edema.  Pertinent Labs/Radiology:    Latest Ref Rng & Units 04/25/2024    3:08 AM 04/23/2024    4:16 AM 04/22/2024    8:46 PM  CBC  WBC 4.0 - 10.5 K/uL 6.1  12.1  16.1   Hemoglobin 12.0 - 15.0 g/dL 9.2  9.1  8.8   Hematocrit 36.0 - 46.0 % 28.5  27.8  28.7   Platelets 150 - 400 K/uL 213  209  239     Lab Results  Component Value Date   NA 139 04/26/2024   K  2.8 (L) 04/26/2024   CL 97 (L) 04/26/2024   CO2 30 04/26/2024     Assessment/Plan: Hyperkalemia Likely secondary to worsening renal function. Required emergent dialysis on initial presentation Now resolved  AKI on CKD stage IIIb Unclear etiology of AKI but suspicion for hemodynamic mediated kidney injury Renal function has improved with supportive care-continue to follow closely. Nephrology following  Acute on chronic HFpEF Overall improved-no leg edema-breathing is better after starting antibiotics Diuretics defer to nephrology service.  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 Jeffrie) and stroke MD-has been started on Eliquis . Resume statin-as transaminases have improved. Stroke team following  Persistent A-fib with RVR Rate controlled this morning Continue metoprolol /Cardizem  Started on Eliquis  this admit.  See above.  Elevated TSH FT4 normal-likely subclinical hypothyroidism Will hold off on starting levothyroxine (A-fib RVR a few days ago) repeat TSH in 4 to 6 weeks.  B/L PNA Since he was more congested/coughing-started on antibiotics on 7/20-reviewed prior imaging-has clear-cut infiltrates She has improved today-continue Unasyn /Zithromax  for now.  B/L pleural effusions Probably transudative in etiology in the setting of CHF/worsening renal function Diuresis-repeat imaging in a few weeks Currently these effusions do not appear to be symptomatic.  Normocytic anemia Likely due to combination of CKD/malignancy Follow Hb  Hypokalemia Likely due to furosemide  Will replete and recheck tomorrow-check magnesium  with a.m. labs as well.  Transaminitis Mild Unclear etiology LFTs  have essentially normalized.  HTN BP fluctuating-remains on the higher side Continue Cardizem /Imdur -metoprolol  increased to 100 twice daily, hydralazine   added Follow/optimize.  History of breast cancer with metastatic disease Prior history of ovarian cancer Continue outpatient follow-up with oncology.  Code status:   Code Status: Limited: Do not attempt resuscitation (DNR) -DNR-LIMITED -Do Not Intubate/DNI    DVT Prophylaxis: apixaban  (ELIQUIS ) tablet 2.5 mg Start: 04/24/24 1100 apixaban  (ELIQUIS ) tablet 2.5 mg    Family Communication: Daughter-Carolyn [phone] left VM 7/20, 721   Disposition Plan: Status is: Inpatient Remains inpatient appropriate because: Severity of illness   Planned Discharge Destination:Home health   Diet: Diet Order             Diet regular Room service appropriate? Yes; Fluid consistency: Thin  Diet effective now                     Antimicrobial agents: Anti-infectives (From admission, onward)    Start     Dose/Rate Route Frequency Ordered Stop   04/25/24 1030  Ampicillin -Sulbactam (UNASYN ) 3 g in sodium chloride  0.9 % 100 mL IVPB        3 g 200 mL/hr over 30 Minutes Intravenous Every 12 hours 04/25/24 0944 04/30/24 1029   04/25/24 1015  azithromycin  (ZITHROMAX ) tablet 500 mg        500 mg Oral Daily 04/25/24 0917 04/28/24 0959        MEDICATIONS: Scheduled Meds:  apixaban   2.5 mg Oral BID   azithromycin   500 mg Oral Daily   Chlorhexidine  Gluconate Cloth  6 each Topical Q0600   diltiazem  (CARDIZEM  CD) 24 hr capsule 360 mg  360 mg Oral Daily   guaiFENesin   600 mg Oral BID   isosorbide  mononitrate  60 mg Oral Daily   metoprolol  tartrate  100 mg Oral BID   potassium chloride   40 mEq Oral Once   rosuvastatin   5 mg Oral QHS   sodium chloride  flush  10-40 mL Intracatheter Q12H   Continuous Infusions:  ampicillin -sulbactam (UNASYN ) IV 3 g (04/26/24 0856)   anticoagulant sodium citrate      potassium chloride      PRN Meds:.alteplase , anticoagulant sodium citrate , benzonatate , docusate sodium , heparin , hydrALAZINE , ipratropium-albuterol , labetalol , lidocaine  (PF),  lidocaine -prilocaine , loperamide , metoprolol  tartrate, ondansetron  (ZOFRAN ) IV, oxyCODONE , pentafluoroprop-tetrafluoroeth, polyethylene glycol, sodium chloride  flush   I have personally reviewed following labs and imaging studies  LABORATORY DATA: CBC: Recent Labs  Lab 04/22/24 1041 04/22/24 1041 04/22/24 1612 04/22/24 1624 04/22/24 1708 04/22/24 2046 04/23/24 0416 04/25/24 0308  WBC 11.3*  --  12.6*  --   --  16.1* 12.1* 6.1  NEUTROABS 9.3*  --  9.6*  --   --   --   --   --   HGB 9.2*   < > 9.0* 9.2* 7.8* 8.8* 9.1* 9.2*  HCT 29.5*  --  29.8* 27.0* 23.0* 28.7* 27.8* 28.5*  MCV 93.7  --  96.8  --   --  96.0 89.1 91.3  PLT 228  --  259  --   --  239 209 213   < > = values in this interval not displayed.    Basic Metabolic Panel: Recent Labs  Lab 04/22/24 1041 04/22/24 1131 04/22/24 1612 04/22/24 1624 04/22/24 2046 04/23/24 0416 04/24/24 1016 04/25/24 0308 04/26/24 0446  NA 137   < > 135   < > 138 136 136 136 139  K 6.5*   < > 7.4*   < > 6.5* 3.8  3.3* 2.8* 2.8*  CL 108   < > 110   < > 112* 98 93* 96* 97*  CO2 15*   < > 12*  --  14* 25 30 30 30   GLUCOSE 131*   < > 259*   < > 160* 102* 120* 103* 124*  BUN 45*   < > 46*   < > 46* 17 23 25* 22  CREATININE 2.98*   < > 3.09*   < > 3.02* 1.58* 1.72* 1.72* 1.51*  CALCIUM  8.4*   < > 8.0*  --  8.8* 8.6* 7.8* 8.0* 8.2*  MG 1.1*  --  1.1*  --   --  2.0  --   --   --   PHOS  --   --   --   --  6.0* 3.8 3.8  --   --    < > = values in this interval not displayed.    GFR: Estimated Creatinine Clearance: 20.6 mL/min (A) (by C-G formula based on SCr of 1.51 mg/dL (H)).  Liver Function Tests: Recent Labs  Lab 04/22/24 1612 04/22/24 2046 04/23/24 0416 04/24/24 1016 04/24/24 1030 04/25/24 0308 04/26/24 0446  AST 49*  --  116*  --  44* 33 26  ALT 39  --  104*  --  59* 47* 36  ALKPHOS 118  --  200*  --  153* 136* 118  BILITOT 0.6  --  0.7  --  0.6 0.9 0.9  PROT 5.5*  --  5.8*  --  5.2* 5.3* 5.5*  ALBUMIN  2.5*   < > 2.7*  2.2* 2.3* 2.3* 2.3*   < > = values in this interval not displayed.   No results for input(s): LIPASE, AMYLASE in the last 168 hours. Recent Labs  Lab 04/22/24 1947  AMMONIA 24    Coagulation Profile: Recent Labs  Lab 04/23/24 0416  INR 1.3*    Cardiac Enzymes: No results for input(s): CKTOTAL, CKMB, CKMBINDEX, TROPONINI in the last 168 hours.  BNP (last 3 results) No results for input(s): PROBNP in the last 8760 hours.  Lipid Profile: Recent Labs    04/24/24 0500  CHOL 67  HDL 32*  LDLCALC 20  TRIG 74  CHOLHDL 2.1    Thyroid  Function Tests: Recent Labs    04/25/24 1009  FREET4 1.01    Anemia Panel: No results for input(s): VITAMINB12, FOLATE, FERRITIN, TIBC, IRON, RETICCTPCT in the last 72 hours.  Urine analysis:    Component Value Date/Time   COLORURINE YELLOW 11/21/2023 1840   APPEARANCEUR CLEAR 11/21/2023 1840   LABSPEC 1.016 11/21/2023 1840   PHURINE 5.0 11/21/2023 1840   GLUCOSEU NEGATIVE 11/21/2023 1840   HGBUR NEGATIVE 11/21/2023 1840   BILIRUBINUR NEGATIVE 11/21/2023 1840   BILIRUBINUR n 09/15/2015 1123   KETONESUR NEGATIVE 11/21/2023 1840   PROTEINUR NEGATIVE 11/21/2023 1840   UROBILINOGEN negative 09/15/2015 1123   UROBILINOGEN 0.2 04/11/2009 1015   NITRITE NEGATIVE 11/21/2023 1840   LEUKOCYTESUR NEGATIVE 11/21/2023 1840    Sepsis Labs: Lactic Acid, Venous    Component Value Date/Time   LATICACIDVEN 1.5 04/09/2024 1342    MICROBIOLOGY: Recent Results (from the past 240 hours)  MRSA Next Gen by PCR, Nasal     Status: None   Collection Time: 04/22/24 11:06 PM   Specimen: Nasal Mucosa; Nasal Swab  Result Value Ref Range Status   MRSA by PCR Next Gen NOT DETECTED NOT DETECTED Final    Comment: (NOTE) The GeneXpert MRSA Assay (FDA  approved for NASAL specimens only), is one component of a comprehensive MRSA colonization surveillance program. It is not intended to diagnose MRSA infection nor to guide or  monitor treatment for MRSA infections. Test performance is not FDA approved in patients less than 28 years old. Performed at [facility], [address], [place], [place] [address]     RADIOLOGY STUDIES/RESULTS: VAS US  CAROTID (at [facility] and [facility] only) Result Date: 04/25/2024 Carotid Arterial Duplex Study Patient Name:  [name]  Date of Exam:   04/25/2024 Medical Rec #: [id number]         Accession #:    [id number] Date of Birth: [birth year]        Patient Gender: F Patient Age:   38 years Exam Location:  [facility] Procedure:      VAS US  CAROTID Referring Phys: EARLE DE LA TORRE --------------------------------------------------------------------------------  Indications:       Carotid artery disease and acute onset right arm and leg                    weakness. Risk Factors:      Hypertension, hyperlipidemia, coronary artery disease. Other Factors:     CKD stage IIIb, metastatic breast cancer, ovarian cancer,                    A-fib. Limitations        Today's exam was limited due to Line and bandages in left                    neck. Comparison Study:  Prior carotid duplex done 03/14/2022 indicating bilateral 1-39%                    ICA stenosis. Performing Technologist: Eleanor Lincoln  Examination Guidelines: A complete evaluation includes B-mode imaging, spectral Doppler, color Doppler, and power Doppler as needed of all accessible portions of each vessel. Bilateral testing is considered an integral part of a complete examination. Limited examinations for reoccurring indications may be performed as noted.  Right Carotid Findings: +----------+--------+--------+--------+------------------+------------------+           PSV cm/sEDV cm/sStenosisPlaque DescriptionComments           +----------+--------+--------+--------+------------------+------------------+ CCA Prox  140     17                                intimal thickening  +----------+--------+--------+--------+------------------+------------------+ CCA Distal122     11              calcific                             +----------+--------+--------+--------+------------------+------------------+ ICA Prox  87      16      1-39%   calcific                             +----------+--------+--------+--------+------------------+------------------+ ICA Mid   82      16                                                   +----------+--------+--------+--------+------------------+------------------+ ICA Distal97      20                                                   +----------+--------+--------+--------+------------------+------------------+  ECA       84      8                                                    +----------+--------+--------+--------+------------------+------------------+ +----------+--------+-------+--------+-------------------+           PSV cm/sEDV cmsDescribeArm Pressure (mmHG) +----------+--------+-------+--------+-------------------+ [id number]                                        +----------+--------+-------+--------+-------------------+ +---------+--------+--+--------+--+ VertebralPSV cm/s80EDV cm/s14 +---------+--------+--+--------+--+  Left Carotid Findings: +----------+--------+--------+--------+------------------+---------------------+           PSV cm/sEDV cm/sStenosisPlaque DescriptionComments              +----------+--------+--------+--------+------------------+---------------------+ CCA Prox                                            Proximal CCA is not                                                       visualized.           +----------+--------+--------+--------+------------------+---------------------+ CCA Mid                                             Not visualized        +----------+--------+--------+--------+------------------+---------------------+ CCA Distal277      36              calcific          Shadowing             +----------+--------+--------+--------+------------------+---------------------+ ICA Prox  111     19      1-39%   calcific                                +----------+--------+--------+--------+------------------+---------------------+ ICA Mid   100     24                                                      +----------+--------+--------+--------+------------------+---------------------+ ICA Distal71      21                                                      +----------+--------+--------+--------+------------------+---------------------+ ECA       72      0                                                       +----------+--------+--------+--------+------------------+---------------------+ +----------+--------+--------+--------+-------------------+  PSV cm/sEDV cm/sDescribeArm Pressure (mmHG) +----------+--------+--------+--------+-------------------+ [id number]                                         +----------+--------+--------+--------+-------------------+   Summary: Right Carotid: Velocities in the right ICA are consistent with a 1-39% stenosis. Left Carotid: Velocities in the left ICA are consistent with a 1-39% stenosis. Vertebrals:  Right vertebral artery demonstrates antegrade flow. Unable to              visualize left vertebral secondary to line/bandage. Subclavians: Normal flow hemodynamics were seen in bilateral subclavian              arteries. *See table(s) above for measurements and observations.     Preliminary    DG Chest Port 1V same Day Result Date: 04/24/2024 CLINICAL DATA:  141880 SOB (shortness of breath) 141880 EXAM: PORTABLE CHEST - 1 VIEW SAME DAY COMPARISON:  CT 04/22/2024 and previous FINDINGS: Left IJ HD catheter to the distal SVC. No pneumothorax. Moderate pulmonary vascular congestion. Pleural effusions right greater than left with some atelectasis/consolidation at  the lung bases. Heart size and mediastinal contours are within normal limits. Aortic Atherosclerosis (ICD10-170.0). Regional bones unremarkable. IMPRESSION: Pulmonary vascular congestion with bilateral pleural effusions and basilar atelectasis/consolidation. Electronically Signed   By: JONETTA Faes M.D.   On: 04/24/2024 13:18     LOS: 4 days   Donalda Applebaum, MD  [facility]    To contact the attending provider between 7A-7P or the covering provider during after hours 7P-7A, please log into the web site [email or web address] and access using universal [place] password for that web site. If you do not have the password, please call the hospital operator.  04/26/2024, 9:17 AM

## 2024-04-26 NOTE — Progress Notes (Signed)
 Occupational Therapy Treatment Patient Details Name: [name] MRN: [id number] DOB: 1934/08/23 Today's Date: 04/26/2024   History of present illness The pt is an 89 yo female presenting [facility] for SOB, bradycardia, and hyperkalemia. Further workup for Acute Renal Failure. While admitted pt developed R sided weakness, MRI demonstrated L frontal ischemia. Pt recently admitted 7/4 with bilateral pleural effusions. PMH includes: stage IV breast cancer (widely metastatic to lung, liver, spine, ribs, peritoneum) which she is going through treatment for currently, PAF not on AC, chronic HFpEF, HTN, CKD stage IIIb, anemia, HTN, recent admission 2/28 -3/04 for new onset PAF with RVR.   OT comments  Pt demonstrating progress with activity tolerance in acute OT sessions, ambulating 132ft with CGA + RW and her Sp02 remained at 92% with activity, she denied SOB or exertion. Pt was noted to be wheezing at end of ambulation with sounds of congestive cough, educated pt on use of acapella to help expel sputum by production stronger productive cough. Next session may incorporate energy conservation strategies prn and check need for [facility] HEP.  OT to continue efforts to progress pt as able. DC plans remain appropriate for [facility].       If plan is discharge home, recommend the following:  Assist for transportation;Assistance with cooking/housework   Equipment Recommendations  Other (comment) (Pt has rec DME)    Recommendations for Other Services      Precautions / Restrictions Precautions Precautions: Fall Recall of Precautions/Restrictions: Intact Precaution/Restrictions Comments: watch HR with exertion, watch BP, 1L [place] O2 Restrictions Weight Bearing Restrictions Per Provider Order: No       Mobility Bed Mobility               General bed mobility comments: Pt in recliner on arrival    Transfers Overall transfer level: Modified independent   Transfers: Sit to/from Stand Sit to  Stand: Modified independent (Device/Increase time)                 Balance Overall balance assessment: Needs assistance Sitting-balance support: No upper extremity supported, Feet supported Sitting balance-Leahy Scale: Good     Standing balance support: Single extremity supported, During functional activity Standing balance-Leahy Scale: Fair                             ADL either performed or assessed with clinical judgement   ADL                                       Functional mobility during ADLs: Rolling walker (2 wheels);Contact guard assist General ADL Comments: Pt declined need for ADLs at this time. Educated pt on use of Acapella for congestion, trialed several time with cues for deep inhalation and forceful exhalation to produce strong productive cough.    Extremity/Trunk Assessment              Vision       Restaurant manager, fast food Communication: No apparent difficulties   Cognition Arousal: Alert Behavior During Therapy: WFL for tasks assessed/performed Cognition: No apparent impairments                               Following commands: Intact  Cueing   Cueing Techniques: Verbal cues  Exercises      Shoulder Instructions       General Comments Sp02 resting 94% on RA, remained at 92% with activity, returned to 1L was pleth was hard to determine. wave form was bad but Sp02 reading 95% on 1L    Pertinent Vitals/ Pain       Pain Assessment Pain Assessment: Faces Faces Pain Scale: Hurts a little bit Pain Location: back- chronic Pain Descriptors / Indicators: Discomfort Pain Intervention(s): Monitored during session  Home Living                                          Prior Functioning/Environment              Frequency  Min 2X/week        Progress Toward Goals  OT Goals(current goals can now be found in the care plan section)   Progress towards OT goals: Progressing toward goals  Acute Rehab OT Goals Patient Stated Goal: Hopeful to go home OT Goal Formulation: With patient Time For Goal Achievement: 05/08/24 Potential to Achieve Goals: Good  Plan      Co-evaluation                 AM-PAC OT 6 Clicks Daily Activity     Outcome Measure   Help from another person eating meals?: None Help from another person taking care of personal grooming?: A Little Help from another person toileting, which includes using toliet, bedpan, or urinal?: A Little Help from another person bathing (including washing, rinsing, drying)?: A Little Help from another person to put on and taking off regular upper body clothing?: A Little Help from another person to put on and taking off regular lower body clothing?: A Little 6 Click Score: 19    End of Session Equipment Utilized During Treatment: Gait belt;Rolling walker (2 wheels)  OT Visit Diagnosis: Unsteadiness on feet (R26.81);Other symptoms and signs involving the nervous system (R29.898);Muscle weakness (generalized) (M62.81)   Activity Tolerance Patient tolerated treatment well   Patient Left in chair;with call bell/phone within reach;with family/visitor present   Nurse Communication Mobility status        Time: 8376-8352 OT Time Calculation (min): 24 min  Charges: OT General Charges $OT Visit: 1 Visit OT Treatments $Therapeutic Activity: 23-37 mins  04/26/2024  AB, OTR/L  Acute Rehabilitation Services  Office: [phone]   [name] 04/26/2024, 6:22 PM

## 2024-04-26 NOTE — Progress Notes (Signed)
 Physical Therapy Treatment Patient Details Name: [name] MRN: [id number] DOB: Jul 05, 1934 Today's Date: 04/26/2024   History of Present Illness The pt is an 89 yo female presenting to [facility] on 04/22/24 for SOB, bradycardia, and hyperkalemia. Further workup for Acute Renal Failure. While admitted pt developed R sided weakness, MRI demonstrated L frontal ischemia. Pt recently admitted 7/4 with bilateral pleural effusions. PMH includes: stage IV breast cancer (widely metastatic to lung, liver, spine, ribs, peritoneum) which she is going through treatment for currently, PAF not on AC, chronic HFpEF, HTN, CKD stage IIIb, anemia, HTN, recent admission 2/28 -3/04 for new onset PAF with RVR.    PT Comments  Pt remains limited due to elevated HR with activity, SPO2 stable on 1LNC.   BP pre tx:  177/98 MAP 116 BP post tx: 191/80 MAP 113  Pt continues to present with minor balance impairments during session, most likely from immobility.  Pt continues on track to return home with HHPT.     If plan is discharge home, recommend the following: A little help with walking and/or transfers;A little help with bathing/dressing/bathroom;Assistance with cooking/housework;Assist for transportation;Help with stairs or ramp for entrance   Can travel by private vehicle        Equipment Recommendations  None recommended by PT    Recommendations for Other Services       Precautions / Restrictions Precautions Precautions: Fall Recall of Precautions/Restrictions: Intact Precaution/Restrictions Comments: watch HR with exertion, watch BP, 1L [place] O2 Restrictions Weight Bearing Restrictions Per Provider Order: No     Mobility  Bed Mobility Overal bed mobility: Modified Independent                  Transfers Overall transfer level: Modified independent                      Ambulation/Gait Ambulation/Gait assistance: Min assist Gait Distance (Feet): 80 Feet Assistive device: IV  Pole Gait Pattern/deviations: Step-through pattern, Decreased stride length, Shuffle, Narrow base of support Gait velocity: decreased     General Gait Details: session remains limited due to elevated HR, pt is asymptomatic.  Cues for posture and pacing.  HR pre gt 102, during gt elevated  123-161bpm.   Stairs             Wheelchair Mobility     Tilt Bed    Modified Rankin (Stroke Patients Only)       Balance Overall balance assessment: Needs assistance Sitting-balance support: No upper extremity supported, Feet supported Sitting balance-Leahy Scale: Good       Standing balance-Leahy Scale: Fair                              Hotel manager: No apparent difficulties  Cognition Arousal: Alert Behavior During Therapy: WFL for tasks assessed/performed   PT - Cognitive impairments: No apparent impairments                         Following commands: Intact      Cueing Cueing Techniques: Verbal cues  Exercises      General Comments        Pertinent Vitals/Pain Pain Assessment Pain Assessment: Faces Faces Pain Scale: Hurts a little bit Pain Location: back- chronic Pain Descriptors / Indicators: Discomfort Pain Intervention(s): Monitored during session, Repositioned    Home Living  Prior Function            PT Goals (current goals can now be found in the care plan section) Acute Rehab PT Goals Patient Stated Goal: return home Potential to Achieve Goals: Good Progress towards PT goals: Progressing toward goals    Frequency    Min 3X/week      PT Plan      Co-evaluation              AM-PAC PT 6 Clicks Mobility   Outcome Measure  Help needed turning from your back to your side while in a flat bed without using bedrails?: None Help needed moving from lying on your back to sitting on the side of a flat bed without using bedrails?: None Help needed  moving to and from a bed to a chair (including a wheelchair)?: None Help needed standing up from a chair using your arms (e.g., wheelchair or bedside chair)?: None Help needed to walk in hospital room?: A Little Help needed climbing 3-5 steps with a railing? : A Little 6 Click Score: 22    End of Session   Activity Tolerance: Patient tolerated treatment well;Treatment limited secondary to medical complications (Comment) Patient left: in chair;with call bell/phone within reach Nurse Communication: Mobility status PT Visit Diagnosis: Unsteadiness on feet (R26.81);Other abnormalities of gait and mobility (R26.89);Other symptoms and signs involving the nervous system (R29.898)     Time: 9171-9151 PT Time Calculation (min) (ACUTE ONLY): 20 min  Charges:    $Gait Training: 8-22 mins PT General Charges $$ ACUTE PT VISIT: 1 Visit                     Toya HAMS , PTA Acute Rehabilitation Services Office [phone]    Toya JINNY Gosling 04/26/2024, 8:52 AM

## 2024-04-27 ENCOUNTER — Inpatient Hospital Stay (HOSPITAL_COMMUNITY)

## 2024-04-27 DIAGNOSIS — I639 Cerebral infarction, unspecified: Secondary | ICD-10-CM

## 2024-04-27 DIAGNOSIS — N179 Acute kidney failure, unspecified: Secondary | ICD-10-CM | POA: Diagnosis not present

## 2024-04-27 LAB — RENAL FUNCTION PANEL
Albumin: 2.3 g/dL — ABNORMAL LOW (ref 3.5–5.0)
Anion gap: 9 (ref 5–15)
BUN: 27 mg/dL — ABNORMAL HIGH (ref 8–23)
CO2: 25 mmol/L (ref 22–32)
Calcium: 8.1 mg/dL — ABNORMAL LOW (ref 8.9–10.3)
Chloride: 102 mmol/L (ref 98–111)
Creatinine, Ser: 1.59 mg/dL — ABNORMAL HIGH (ref 0.44–1.00)
GFR, Estimated: 31 mL/min — ABNORMAL LOW (ref >60)
Glucose, Bld: 199 mg/dL — ABNORMAL HIGH (ref 70–99)
Phosphorus: 4.5 mg/dL (ref 2.5–4.6)
Potassium: 5.2 mmol/L — ABNORMAL HIGH (ref 3.5–5.1)
Sodium: 136 mmol/L (ref 135–145)

## 2024-04-27 LAB — BASIC METABOLIC PANEL WITH GFR
Anion gap: 9 (ref 5–15)
BUN: 31 mg/dL — ABNORMAL HIGH (ref 8–23)
CO2: 26 mmol/L (ref 22–32)
Calcium: 8 mg/dL — ABNORMAL LOW (ref 8.9–10.3)
Chloride: 99 mmol/L (ref 98–111)
Creatinine, Ser: 1.89 mg/dL — ABNORMAL HIGH (ref 0.44–1.00)
GFR, Estimated: 25 mL/min — ABNORMAL LOW (ref >60)
Glucose, Bld: 240 mg/dL — ABNORMAL HIGH (ref 70–99)
Potassium: 5.2 mmol/L — ABNORMAL HIGH (ref 3.5–5.1)
Sodium: 134 mmol/L — ABNORMAL LOW (ref 135–145)

## 2024-04-27 LAB — MAGNESIUM: Magnesium: 1.3 mg/dL — ABNORMAL LOW (ref 1.7–2.4)

## 2024-04-27 MED ORDER — MAGNESIUM SULFATE 4 GM/100ML IV SOLN
4.0000 g | Freq: Once | INTRAVENOUS | Status: AC
  Administered 2024-04-27: 4 g via INTRAVENOUS
  Filled 2024-04-27: qty 100

## 2024-04-27 MED ORDER — FUROSEMIDE 10 MG/ML IJ SOLN
80.0000 mg | Freq: Once | INTRAMUSCULAR | Status: AC
  Administered 2024-04-27: 80 mg via INTRAVENOUS
  Filled 2024-04-27: qty 8

## 2024-04-27 NOTE — Care Management Important Message (Signed)
 Important Message  Patient Details  Name: [name] MRN: [id number] Date of Birth: [birth year]   Important Message Given:  Yes - Medicare IM     Claretta Deed 04/27/2024, 4:19 PM

## 2024-04-27 NOTE — Progress Notes (Signed)
 Physical Therapy Treatment Patient Details Name: [name] MRN: [id number] DOB: Dec 04, 1933 Today's Date: 04/27/2024   History of Present Illness The pt is an 89 yo female presenting to [facility] on 04/22/24 for SOB, bradycardia, and hyperkalemia. Further workup for Acute Renal Failure. While admitted pt developed R sided weakness, MRI demonstrated L frontal ischemia. Pt recently admitted 7/4 with bilateral pleural effusions. PMH includes: stage IV breast cancer (widely metastatic to lung, liver, spine, ribs, peritoneum) which she is going through treatment for currently, PAF not on AC, chronic HFpEF, HTN, CKD stage IIIb, anemia, HTN, recent admission 2/28 -3/04 for new onset PAF with RVR.    PT Comments  Pt received in supine and agreeable to session. Pt able to tolerate increased gait distance on RA with SpO2 >89%. HR 50bpm at rest and remained in the in the 50's during ambulation. Pt benefits from RW support during ambulation for stability and energy conservation. Pt continues to benefit from PT services to progress toward functional mobility goals.     If plan is discharge home, recommend the following: A little help with walking and/or transfers;A little help with bathing/dressing/bathroom;Assistance with cooking/housework;Assist for transportation;Help with stairs or ramp for entrance   Can travel by private vehicle        Equipment Recommendations  None recommended by PT    Recommendations for Other Services       Precautions / Restrictions Precautions Precautions: Fall Recall of Precautions/Restrictions: Intact Precaution/Restrictions Comments: watch HR with exertion, watch BP and O2 Restrictions Weight Bearing Restrictions Per Provider Order: No     Mobility  Bed Mobility Overal bed mobility: Modified Independent                  Transfers Overall transfer level: Modified independent Equipment used: Rolling walker (2 wheels) Transfers: Sit to/from Stand Sit to  Stand: Modified independent (Device/Increase time)                Ambulation/Gait Ambulation/Gait assistance: Contact guard assist Gait Distance (Feet): 200 Feet Assistive device: Rolling walker (2 wheels), None Gait Pattern/deviations: Step-through pattern, Decreased stride length, Trunk flexed Gait velocity: decreased     General Gait Details: attempted a few steps without UE support, but pt reaching out to environment so used the RW for the remainder of the trial. Steady gait with cues for upright posture   Stairs             Wheelchair Mobility     Tilt Bed    Modified Rankin (Stroke Patients Only) Modified Rankin (Stroke Patients Only) Pre-Morbid Rankin Score: Slight disability Modified Rankin: Moderately severe disability     Balance Overall balance assessment: Needs assistance Sitting-balance support: No upper extremity supported, Feet supported Sitting balance-Leahy Scale: Good     Standing balance support: During functional activity, Bilateral upper extremity supported, No upper extremity supported Standing balance-Leahy Scale: Poor Standing balance comment: better with RW for dynamic bal, static stand is steady.                            Communication Communication Communication: No apparent difficulties  Cognition Arousal: Alert Behavior During Therapy: WFL for tasks assessed/performed   PT - Cognitive impairments: No apparent impairments                         Following commands: Intact      Cueing Cueing Techniques: Verbal cues  Exercises  General Comments General comments (skin integrity, edema, etc.): SpO2 94 % on RA at rest and >89% on RA during ambulation, although poor pleth most of the gait trial. Pt left in the recliner on RA with SpO2 92%      Pertinent Vitals/Pain Pain Assessment Pain Assessment: Faces Faces Pain Scale: No hurt     PT Goals (current goals can now be found in the care plan  section) Acute Rehab PT Goals Patient Stated Goal: return home PT Goal Formulation: With patient Time For Goal Achievement: 05/01/24 Progress towards PT goals: Progressing toward goals    Frequency    Min 3X/week       AM-PAC PT 6 Clicks Mobility   Outcome Measure  Help needed turning from your back to your side while in a flat bed without using bedrails?: None Help needed moving from lying on your back to sitting on the side of a flat bed without using bedrails?: None Help needed moving to and from a bed to a chair (including a wheelchair)?: None Help needed standing up from a chair using your arms (e.g., wheelchair or bedside chair)?: None Help needed to walk in hospital room?: A Little Help needed climbing 3-5 steps with a railing? : A Little 6 Click Score: 22    End of Session Equipment Utilized During Treatment: Gait belt Activity Tolerance: Patient tolerated treatment well Patient left: in chair;with call bell/phone within reach;with family/visitor present Nurse Communication: Mobility status PT Visit Diagnosis: Unsteadiness on feet (R26.81);Other abnormalities of gait and mobility (R26.89);Other symptoms and signs involving the nervous system (R29.898)     Time: 1440-1500 PT Time Calculation (min) (ACUTE ONLY): 20 min  Charges:    $Gait Training: 8-22 mins PT General Charges $$ ACUTE PT VISIT: 1 Visit                     {Zaineb Nowaczyk Ferdie, PTA Acute Rehabilitation Services Secure Chat Preferred  Office:([phone]) [phone]    Darryle Ferdie 04/27/2024, 3:55 PM

## 2024-04-27 NOTE — Care Management Important Message (Signed)
 Important Message  Patient Details  Name: [name] MRN: [id number] Date of Birth: 1934-04-22   Important Message Given:  Yes - Medicare and [employer] IM     Claretta Deed 04/27/2024, 4:19 PM

## 2024-04-27 NOTE — Progress Notes (Signed)
 PROGRESS NOTE        PATIENT DETAILS Name: [name] Age: 89 y.o. Sex: female Date of Birth: [birth year] Admit Date: 04/22/2024 Admitting Physician Donnice JONELLE Beals, MD ERE:Cjmjijmjgjw, [name], MD  Brief Summary: Patient is a 89 y.o.  female with history of metastatic breast cancer, HTN, CKD stage IIIb, chronic HFrEF-who was sent from nephrology clinic for evaluation of hyperkalemia and AKI.  Significant events: 7/17>> admit to ICU-severe hyperkalemia-underwent emergent HD. 7/18>> RUE weakness-code stroke-MRI brain positive for acute CVA 7/19>> transferred to [facility]  Significant studies: 7/17>> CT renal stone study: No hydronephrosis, B/L lower lobe consolidation/effusion. 7/17>> CT chest: Progressive B/L lower lobe infiltrate with associated effusions. 7/18>> MRI brain: Acute infarct posterior left frontal white matter. 7/18>> TTE: EF 65-70% 7/18>> A1c: 6.1 7/19>> MRA head: No LVO, intracranial atherosclerosis-mild stenosis of left MCA M1, right PCA P1 segments. 7/19>> LDL: 20.  Significant microbiology data: None  Procedures: None  Consults: PCCM Neurology Neurology  Subjective: Coughing continues-apparently was short of breath after working with physical therapy yesterday.  Objective: Vitals: Blood pressure 120/68, pulse (!) 101, temperature (!) 97.4 F (36.3 C), temperature source Oral, resp. rate 19, weight 53.8 kg, last menstrual period 10/07/1974, SpO2 90%.   Exam: Awake alert Chest: Continue to show bibasilar rales CVS: S1-S2 regular Abdomen: Soft nontender nondistended Extremities: No edema.  Pertinent Labs/Radiology:    Latest Ref Rng & Units 04/25/2024    3:08 AM 04/23/2024    4:16 AM 04/22/2024    8:46 PM  CBC  WBC 4.0 - 10.5 K/uL 6.1  12.1  16.1   Hemoglobin 12.0 - 15.0 g/dL 9.2  9.1  8.8   Hematocrit 36.0 - 46.0 % 28.5  27.8  28.7   Platelets 150 - 400 K/uL 213  209  239     Lab Results  Component Value  Date   NA 136 04/27/2024   K 5.2 (H) 04/27/2024   CL 102 04/27/2024   CO2 25 04/27/2024     Assessment/Plan: Hyperkalemia Likely secondary to worsening renal function. Required emergent dialysis on initial presentation Now resolved  AKI on CKD stage IIIb Unclear etiology of AKI but suspicion for hemodynamic mediated kidney injury Renal function has improved with supportive care-continue to follow closely. Nephrology following  Acute on chronic HFpEF Overall improved-but still short of breath with activity.  Continues to have bibasilar rales Repeat IV Lasix  80 mg x 1 today Follow electrolytes. Repeat two-view chest x-ray today.  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 Jeffrie) and stroke MD-has been started on Eliquis . Continue statin. Stroke team following  Persistent A-fib with RVR Rate controlled this morning Continue metoprolol /Cardizem  Started on Eliquis  this admit.  See above.  Elevated TSH FT4 normal-likely subclinical hypothyroidism Will hold off on starting levothyroxine (A-fib RVR a few days ago) repeat TSH in 4 to 6 weeks.  B/L PNA Since he was more congested/coughing-started on antibiotics on 7/20-reviewed prior imaging-has clear-cut infiltrates She has improved today-continue Unasyn  Zithromax  x 3 days completed.  B/L pleural effusions Probably transudative in etiology in the setting of CHF/worsening renal function Diuresis-repeat imaging in a few weeks Currently these effusions do not appear to be symptomatic.  Normocytic anemia Likely due to combination of CKD/malignancy Follow Hb  Hypokalemia Likely due to furosemide   Resolved mildly hyperkalemic today. Repeat electrolytes tomorrow.  Hypomagnesemia Replete/recheck  Transaminitis Mild Unclear etiology LFTs have essentially normalized.  HTN BP better  controlled Continue Cardizem /Imdur /metoprolol /hydralazine .  Follow/optimize.  History of breast cancer with metastatic disease Prior history of ovarian cancer Continue outpatient follow-up with oncology.  Code status:   Code Status: Limited: Do not attempt resuscitation (DNR) -DNR-LIMITED -Do Not Intubate/DNI    DVT Prophylaxis: apixaban  (ELIQUIS ) tablet 2.5 mg Start: 04/24/24 1100 apixaban  (ELIQUIS ) tablet 2.5 mg    Family Communication: Daughter-Carolyn [phone] left VM 7/20, 721   Disposition Plan: Status is: Inpatient Remains inpatient appropriate because: Severity of illness   Planned Discharge Destination:Home health   Diet: Diet Order             Diet regular Room service appropriate? Yes; Fluid consistency: Thin  Diet effective now                     Antimicrobial agents: Anti-infectives (From admission, onward)    Start     Dose/Rate Route Frequency Ordered Stop   04/25/24 1030  Ampicillin -Sulbactam (UNASYN ) 3 g in sodium chloride  0.9 % 100 mL IVPB        3 g 200 mL/hr over 30 Minutes Intravenous Every 12 hours 04/25/24 0944 04/30/24 1029   04/25/24 1015  azithromycin  (ZITHROMAX ) tablet 500 mg        500 mg Oral Daily 04/25/24 0917 04/27/24 0816        MEDICATIONS: Scheduled Meds:  apixaban   2.5 mg Oral BID   Chlorhexidine  Gluconate Cloth  6 each Topical Q0600   diltiazem  (CARDIZEM  CD) 24 hr capsule 360 mg  360 mg Oral Daily   guaiFENesin   600 mg Oral BID   hydrALAZINE   25 mg Oral Q8H   isosorbide  mononitrate  60 mg Oral Daily   metoprolol  tartrate  100 mg Oral BID   rosuvastatin   5 mg Oral QHS   sodium chloride  flush  10-40 mL Intracatheter Q12H   Continuous Infusions:  ampicillin -sulbactam (UNASYN ) IV 3 g (04/26/24 2108)   anticoagulant sodium citrate      PRN Meds:.alteplase , anticoagulant sodium citrate , benzonatate , docusate sodium , heparin , hydrALAZINE , ipratropium-albuterol , labetalol , lidocaine  (PF), lidocaine -prilocaine ,  loperamide , metoprolol  tartrate, ondansetron  (ZOFRAN ) IV, oxyCODONE , pentafluoroprop-tetrafluoroeth, polyethylene glycol, sodium chloride  flush   I have personally reviewed following labs and imaging studies  LABORATORY DATA: CBC: Recent Labs  Lab 04/22/24 1041 04/22/24 1041 04/22/24 1612 04/22/24 1624 04/22/24 1708 04/22/24 2046 04/23/24 0416 04/25/24 0308  WBC 11.3*  --  12.6*  --   --  16.1* 12.1* 6.1  NEUTROABS 9.3*  --  9.6*  --   --   --   --   --   HGB 9.2*   < > 9.0* 9.2* 7.8* 8.8* 9.1* 9.2*  HCT 29.5*  --  29.8* 27.0* 23.0* 28.7* 27.8* 28.5*  MCV 93.7  --  96.8  --   --  96.0 89.1 91.3  PLT 228  --  259  --   --  239 209 213   < > = values in this interval not displayed.    Basic Metabolic Panel: Recent Labs  Lab 04/22/24 1041 04/22/24 1131 04/22/24 1612 04/22/24 1624 04/22/24 2046 04/23/24 0416 04/24/24 1016 04/25/24 0308 04/26/24 0446 04/27/24 0406  NA 137   < > 135   < > 138 136 136 136 139 136  K 6.5*   < > 7.4*   < > 6.5* 3.8 3.3* 2.8* 2.8* 5.2*  CL 108   < > 110   < > 112* 98 93* 96* 97* 102  CO2 15*   < > 12*  --  14* 25 30 30 30 25   GLUCOSE 131*   < > 259*   < > 160* 102* 120* 103* 124* 199*  BUN 45*   < > 46*   < > 46* 17 23 25* 22 27*  CREATININE 2.98*   < > 3.09*   < > 3.02* 1.58* 1.72* 1.72* 1.51* 1.59*  CALCIUM  8.4*   < > 8.0*  --  8.8* 8.6* 7.8* 8.0* 8.2* 8.1*  MG 1.1*  --  1.1*  --   --  2.0  --   --   --  1.3*  PHOS  --   --   --   --  6.0* 3.8 3.8  --   --  4.5   < > = values in this interval not displayed.    GFR: Estimated Creatinine Clearance: 21.2 mL/min (A) (by C-G formula based on SCr of 1.59 mg/dL (H)).  Liver Function Tests: Recent Labs  Lab 04/22/24 1612 04/22/24 2046 04/23/24 0416 04/24/24 1016 04/24/24 1030 04/25/24 0308 04/26/24 0446 04/27/24 0406  AST 49*  --  116*  --  44* 33 26  --   ALT 39  --  104*  --  59* 47* 36  --   ALKPHOS 118  --  200*  --  153* 136* 118  --   BILITOT 0.6  --  0.7  --  0.6 0.9 0.9   --   PROT 5.5*  --  5.8*  --  5.2* 5.3* 5.5*  --   ALBUMIN  2.5*   < > 2.7* 2.2* 2.3* 2.3* 2.3* 2.3*   < > = values in this interval not displayed.   No results for input(s): LIPASE, AMYLASE in the last 168 hours. Recent Labs  Lab 04/22/24 1947  AMMONIA 24    Coagulation Profile: Recent Labs  Lab 04/23/24 0416  INR 1.3*    Cardiac Enzymes: No results for input(s): CKTOTAL, CKMB, CKMBINDEX, TROPONINI in the last 168 hours.  BNP (last 3 results) No results for input(s): PROBNP in the last 8760 hours.  Lipid Profile: No results for input(s): CHOL, HDL, LDLCALC, TRIG, CHOLHDL, LDLDIRECT in the last 72 hours.   Thyroid  Function Tests: Recent Labs    04/25/24 1009  FREET4 1.01    Anemia Panel: No results for input(s): VITAMINB12, FOLATE, FERRITIN, TIBC, IRON, RETICCTPCT in the last 72 hours.  Urine analysis:    Component Value Date/Time   COLORURINE YELLOW 11/21/2023 1840   APPEARANCEUR CLEAR 11/21/2023 1840   LABSPEC 1.016 11/21/2023 1840   PHURINE 5.0 11/21/2023 1840   GLUCOSEU NEGATIVE 11/21/2023 1840   HGBUR NEGATIVE 11/21/2023 1840   BILIRUBINUR NEGATIVE 11/21/2023 1840   BILIRUBINUR n 09/15/2015 1123   KETONESUR NEGATIVE 11/21/2023 1840   PROTEINUR NEGATIVE 11/21/2023 1840   UROBILINOGEN negative 09/15/2015 1123   UROBILINOGEN 0.2 04/11/2009 1015   NITRITE NEGATIVE 11/21/2023 1840   LEUKOCYTESUR NEGATIVE 11/21/2023 1840    Sepsis Labs: Lactic Acid, Venous    Component Value Date/Time   LATICACIDVEN 1.5 04/09/2024 1342    MICROBIOLOGY: Recent Results (from the past 240 hours)  MRSA Next Gen by PCR, Nasal     Status: None   Collection Time: 04/22/24 11:06 PM   Specimen: Nasal Mucosa; Nasal Swab  Result Value Ref Range Status   MRSA by PCR Next Gen NOT  DETECTED NOT DETECTED Final    Comment: (NOTE) The GeneXpert MRSA Assay (FDA approved for NASAL specimens only), is one component of a comprehensive MRSA  colonization surveillance program. It is not intended to diagnose MRSA infection nor to guide or monitor treatment for MRSA infections. Test performance is not FDA approved in patients less than 77 years old. Performed at [facility], [address] [place], [place] [address]     RADIOLOGY STUDIES/RESULTS: VAS US  CAROTID (at [facility] and [facility] only) Result Date: 04/26/2024 Carotid Arterial Duplex Study Patient Name:  [name]  Date of Exam:   04/25/2024 Medical Rec #: [id number]         Accession #:    [id number] Date of Birth: 11-01-1933        Patient Gender: F Patient Age:   9 years Exam Location:  [facility] Procedure:      VAS US  CAROTID Referring Phys: EARLE DE LA TORRE --------------------------------------------------------------------------------  Indications:       Carotid artery disease and acute onset right arm and leg                    weakness. Risk Factors:      Hypertension, hyperlipidemia, coronary artery disease. Other Factors:     CKD stage IIIb, metastatic breast cancer, ovarian cancer,                    A-fib. Limitations        Today's exam was limited due to Line and bandages in left                    neck. Comparison Study:  Prior carotid duplex done 03/14/2022 indicating bilateral 1-39%                    ICA stenosis. Performing Technologist: Eleanor Lincoln  Examination Guidelines: A complete evaluation includes B-mode imaging, spectral Doppler, color Doppler, and power Doppler as needed of all accessible portions of each vessel. Bilateral testing is considered an integral part of a complete examination. Limited examinations for reoccurring indications may be performed as noted.  Right Carotid Findings: +----------+--------+--------+--------+------------------+------------------+           PSV cm/sEDV cm/sStenosisPlaque DescriptionComments           +----------+--------+--------+--------+------------------+------------------+ CCA Prox  140     17                                 intimal thickening +----------+--------+--------+--------+------------------+------------------+ CCA Distal122     11              calcific                             +----------+--------+--------+--------+------------------+------------------+ ICA Prox  87      16      1-39%   calcific                             +----------+--------+--------+--------+------------------+------------------+ ICA Mid   82      16                                                   +----------+--------+--------+--------+------------------+------------------+  ICA Distal97      20                                                   +----------+--------+--------+--------+------------------+------------------+ ECA       84      8                                                    +----------+--------+--------+--------+------------------+------------------+ +----------+--------+-------+--------+-------------------+           PSV cm/sEDV cmsDescribeArm Pressure (mmHG) +----------+--------+-------+--------+-------------------+ [id number]                                        +----------+--------+-------+--------+-------------------+ +---------+--------+--+--------+--+ VertebralPSV cm/s80EDV cm/s14 +---------+--------+--+--------+--+  Left Carotid Findings: +----------+--------+--------+--------+------------------+---------------------+           PSV cm/sEDV cm/sStenosisPlaque DescriptionComments              +----------+--------+--------+--------+------------------+---------------------+ CCA Prox                                            Proximal CCA is not                                                       visualized.           +----------+--------+--------+--------+------------------+---------------------+ CCA Mid                                             Not visualized         +----------+--------+--------+--------+------------------+---------------------+ CCA Distal277     36              calcific          Shadowing             +----------+--------+--------+--------+------------------+---------------------+ ICA Prox  111     19      1-39%   calcific                                +----------+--------+--------+--------+------------------+---------------------+ ICA Mid   100     24                                                      +----------+--------+--------+--------+------------------+---------------------+ ICA Distal71      21                                                      +----------+--------+--------+--------+------------------+---------------------+  ECA       72      0                                                       +----------+--------+--------+--------+------------------+---------------------+ +----------+--------+--------+--------+-------------------+           PSV cm/sEDV cm/sDescribeArm Pressure (mmHG) +----------+--------+--------+--------+-------------------+ [id number]                                         +----------+--------+--------+--------+-------------------+   Summary: Right Carotid: Velocities in the right ICA are consistent with a 1-39% stenosis. Left Carotid: Velocities in the left ICA are consistent with a 1-39% stenosis. Vertebrals:  Right vertebral artery demonstrates antegrade flow. Unable to              visualize left vertebral secondary to line/bandage. Subclavians: Normal flow hemodynamics were seen in bilateral subclavian              arteries. *See table(s) above for measurements and observations.  Electronically signed by Eather Popp MD on 04/26/2024 at 5:21:47 PM.    Final      LOS: 5 days   Donalda Applebaum, MD  [facility]    To contact the attending provider between 7A-7P or the covering provider during after hours 7P-7A, please log into the web site [email or web address] and  access using universal [place] password for that web site. If you do not have the password, please call the hospital operator.  04/27/2024, 10:17 AM

## 2024-04-27 NOTE — Plan of Care (Signed)
   Problem: Education: Goal: Knowledge of General Education information will improve Description Including pain rating scale, medication(s)/side effects and non-pharmacologic comfort measures Outcome: Progressing   Problem: Health Behavior/Discharge Planning: Goal: Ability to manage health-related needs will improve Outcome: Progressing

## 2024-04-27 NOTE — Plan of Care (Signed)

## 2024-04-27 NOTE — Progress Notes (Signed)
 Patient ID: [name], female   DOB: [birth year], 89 y.o.   MRN: [id number] [facility] Progress Note   Assessment/ Plan:   1. Acute kidney Injury on chronic kidney disease stage IIIb: Suspected hemodynamically mediated with need for dialysis secondary to hyperkalemia.  Renal function has improved to her baseline status post hemodialysis and now remains stable with inaccurately charted urine output.  She does not have indications for dialysis and we can remove her hemodialysis catheter if she has alternative reliable IV access. 2.  Hypokalemia: This was after initial management of her hyperkalemia upon admission.  She has chronic diarrhea and an element of malabsorption/hypomagnesemia that contribute to low potassium.  Potassium this morning marginally elevated and she was treated with a single dose of IV Lasix .  Will recheck lab again this afternoon. 3.  Bilateral pneumonia: On antimicrobial therapy with Unasyn  and azithromycin .  Still hypoxic and requiring oxygen supplementation via nasal cannula.  She has bilateral pleural effusions that are suspected to be transudative in the setting of congestive heart failure. 4.  History of metastatic breast cancer: Ongoing outpatient follow-up/management by oncology. 5.  Hypertension: Blood pressures improved status post titration of antihypertensive therapy.  Subjective:   Denies any acute events overnight, continues to have intermittent cough and shortness of breath with activity.   Objective:   BP 120/68 (BP Location: Right Arm)   Pulse (!) 101   Temp (!) 97.4 F (36.3 C) (Oral)   Resp 19   Wt 53.8 kg   LMP 10/07/1974   SpO2 90%   BMI 19.14 kg/m   Intake/Output Summary (Last 24 hours) at 04/27/2024 9066 Last data filed at 04/27/2024 0500 Gross per 24 hour  Intake 600 ml  Output 350 ml  Net 250 ml   Weight change: 4.1 kg  Physical Exam: Gen: Chronically ill-appearing woman, resting comfortably in recliner CVS: Pulse  regular tachycardia, S1 and S2 normal.  Temporary left IJ HD catheter Resp: Coarse breath sounds bilaterally with some rales over bases Abd: Soft, flat, nontender, bowel sounds normal Ext: No lower extremity edema  Imaging: VAS US  CAROTID (at [facility] and [facility] only) Result Date: 04/26/2024 Carotid Arterial Duplex Study Patient Name:  [name]  Date of Exam:   04/25/2024 Medical Rec #: [id number]         Accession #:    [id number] Date of Birth: 1934/09/27        Patient Gender: F Patient Age:   84 years Exam Location:  [facility] Procedure:      VAS US  CAROTID Referring Phys: EARLE DE LA TORRE --------------------------------------------------------------------------------  Indications:       Carotid artery disease and acute onset right arm and leg                    weakness. Risk Factors:      Hypertension, hyperlipidemia, coronary artery disease. Other Factors:     CKD stage IIIb, metastatic breast cancer, ovarian cancer,                    A-fib. Limitations        Today's exam was limited due to Line and bandages in left                    neck. Comparison Study:  Prior carotid duplex done 03/14/2022 indicating bilateral 1-39%  ICA stenosis. Performing Technologist: Eleanor Lincoln  Examination Guidelines: A complete evaluation includes B-mode imaging, spectral Doppler, color Doppler, and power Doppler as needed of all accessible portions of each vessel. Bilateral testing is considered an integral part of a complete examination. Limited examinations for reoccurring indications may be performed as noted.  Right Carotid Findings: +----------+--------+--------+--------+------------------+------------------+           PSV cm/sEDV cm/sStenosisPlaque DescriptionComments           +----------+--------+--------+--------+------------------+------------------+ CCA Prox  140     17                                intimal thickening  +----------+--------+--------+--------+------------------+------------------+ CCA Distal122     11              calcific                             +----------+--------+--------+--------+------------------+------------------+ ICA Prox  87      16      1-39%   calcific                             +----------+--------+--------+--------+------------------+------------------+ ICA Mid   82      16                                                   +----------+--------+--------+--------+------------------+------------------+ ICA Distal97      20                                                   +----------+--------+--------+--------+------------------+------------------+ ECA       84      8                                                    +----------+--------+--------+--------+------------------+------------------+ +----------+--------+-------+--------+-------------------+           PSV cm/sEDV cmsDescribeArm Pressure (mmHG) +----------+--------+-------+--------+-------------------+ [id number]                                        +----------+--------+-------+--------+-------------------+ +---------+--------+--+--------+--+ VertebralPSV cm/s80EDV cm/s14 +---------+--------+--+--------+--+  Left Carotid Findings: +----------+--------+--------+--------+------------------+---------------------+           PSV cm/sEDV cm/sStenosisPlaque DescriptionComments              +----------+--------+--------+--------+------------------+---------------------+ CCA Prox                                            Proximal CCA is not  visualized.           +----------+--------+--------+--------+------------------+---------------------+ CCA Mid                                             Not visualized        +----------+--------+--------+--------+------------------+---------------------+ CCA Distal277      36              calcific          Shadowing             +----------+--------+--------+--------+------------------+---------------------+ ICA Prox  111     19      1-39%   calcific                                +----------+--------+--------+--------+------------------+---------------------+ ICA Mid   100     24                                                      +----------+--------+--------+--------+------------------+---------------------+ ICA Distal71      21                                                      +----------+--------+--------+--------+------------------+---------------------+ ECA       72      0                                                       +----------+--------+--------+--------+------------------+---------------------+ +----------+--------+--------+--------+-------------------+           PSV cm/sEDV cm/sDescribeArm Pressure (mmHG) +----------+--------+--------+--------+-------------------+ Dlarojcpjw842                                         +----------+--------+--------+--------+-------------------+   Summary: Right Carotid: Velocities in the right ICA are consistent with a 1-39% stenosis. Left Carotid: Velocities in the left ICA are consistent with a 1-39% stenosis. Vertebrals:  Right vertebral artery demonstrates antegrade flow. Unable to              visualize left vertebral secondary to line/bandage. Subclavians: Normal flow hemodynamics were seen in bilateral subclavian              arteries. *See table(s) above for measurements and observations.  Electronically signed by Eather Popp MD on 04/26/2024 at 5:21:47 PM.    Final     Labs: BMET Recent Labs  Lab 04/22/24 1612 04/22/24 1624 04/22/24 1708 04/22/24 2046 04/23/24 0416 04/24/24 1016 04/25/24 0308 04/26/24 0446 04/27/24 0406  NA 135   < > 136 138 136 136 136 139 136  K 7.4*   < > 6.6* 6.5* 3.8 3.3* 2.8* 2.8* 5.2*  CL 110   < > 113* 112* 98 93* 96* 97* 102  CO2  12*  --   --  14* 25 30 30 30 25   GLUCOSE 259*   < > 291* 160* 102* 120* 103* 124* 199*  BUN 46*   < > 43* 46* 17 23 25* 22 27*  CREATININE 3.09*   < > 3.10* 3.02* 1.58* 1.72* 1.72* 1.51* 1.59*  CALCIUM  8.0*  --   --  8.8* 8.6* 7.8* 8.0* 8.2* 8.1*  PHOS  --   --   --  6.0* 3.8 3.8  --   --  4.5   < > = values in this interval not displayed.   CBC Recent Labs  Lab 04/22/24 1041 04/22/24 1612 04/22/24 1624 04/22/24 1708 04/22/24 2046 04/23/24 0416 04/25/24 0308  WBC 11.3* 12.6*  --   --  16.1* 12.1* 6.1  NEUTROABS 9.3* 9.6*  --   --   --   --   --   HGB 9.2* 9.0*   < > 7.8* 8.8* 9.1* 9.2*  HCT 29.5* 29.8*   < > 23.0* 28.7* 27.8* 28.5*  MCV 93.7 96.8  --   --  96.0 89.1 91.3  PLT 228 259  --   --  239 209 213   < > = values in this interval not displayed.    Medications:     apixaban   2.5 mg Oral BID   Chlorhexidine  Gluconate Cloth  6 each Topical Q0600   diltiazem  (CARDIZEM  CD) 24 hr capsule 360 mg  360 mg Oral Daily   guaiFENesin   600 mg Oral BID   hydrALAZINE   25 mg Oral Q8H   isosorbide  mononitrate  60 mg Oral Daily   metoprolol  tartrate  100 mg Oral BID   rosuvastatin   5 mg Oral QHS   sodium chloride  flush  10-40 mL Intracatheter Q12H    Gordy Blanch, MD 04/27/2024, 9:33 AM

## 2024-04-28 ENCOUNTER — Telehealth: Payer: Self-pay | Admitting: *Deleted

## 2024-04-28 DIAGNOSIS — N179 Acute kidney failure, unspecified: Secondary | ICD-10-CM | POA: Diagnosis not present

## 2024-04-28 DIAGNOSIS — I639 Cerebral infarction, unspecified: Secondary | ICD-10-CM | POA: Diagnosis not present

## 2024-04-28 LAB — GLUCOSE, CAPILLARY
Glucose-Capillary: 109 mg/dL — ABNORMAL HIGH (ref 70–99)
Glucose-Capillary: 173 mg/dL — ABNORMAL HIGH (ref 70–99)
Glucose-Capillary: 216 mg/dL — ABNORMAL HIGH (ref 70–99)

## 2024-04-28 LAB — BASIC METABOLIC PANEL WITH GFR
Anion gap: 9 (ref 5–15)
BUN: 32 mg/dL — ABNORMAL HIGH (ref 8–23)
CO2: 26 mmol/L (ref 22–32)
Calcium: 8.2 mg/dL — ABNORMAL LOW (ref 8.9–10.3)
Chloride: 102 mmol/L (ref 98–111)
Creatinine, Ser: 1.99 mg/dL — ABNORMAL HIGH (ref 0.44–1.00)
GFR, Estimated: 24 mL/min — ABNORMAL LOW (ref >60)
Glucose, Bld: 111 mg/dL — ABNORMAL HIGH (ref 70–99)
Potassium: 4.7 mmol/L (ref 3.5–5.1)
Sodium: 137 mmol/L (ref 135–145)

## 2024-04-28 LAB — MAGNESIUM: Magnesium: 2.3 mg/dL (ref 1.7–2.4)

## 2024-04-28 NOTE — Progress Notes (Signed)
 Physical Therapy Treatment Patient Details Name: [name] MRN: [id number] DOB: 04-Sep-1934 Today's Date: 04/28/2024   History of Present Illness The pt is an 89 yo female presenting to [facility] on 04/22/24 for SOB, bradycardia, and hyperkalemia. Further workup for Acute Renal Failure. While admitted pt developed R sided weakness, MRI demonstrated L frontal ischemia. Pt recently admitted 7/4 with bilateral pleural effusions. PMH includes: stage IV breast cancer (widely metastatic to lung, liver, spine, ribs, peritoneum) which she is going through treatment for currently, PAF not on AC, chronic HFpEF, HTN, CKD stage IIIb, anemia, HTN, recent admission 2/28 -3/04 for new onset PAF with RVR.    PT Comments  Pt tolerated treatment well today. Pt with similar presentation to previous session. No change in DC/DME recs at this time. PT will continue to follow.     If plan is discharge home, recommend the following: A little help with walking and/or transfers;A little help with bathing/dressing/bathroom;Assistance with cooking/housework;Assist for transportation;Help with stairs or ramp for entrance   Can travel by private vehicle        Equipment Recommendations  None recommended by PT    Recommendations for Other Services       Precautions / Restrictions Precautions Precautions: Fall Recall of Precautions/Restrictions: Intact Precaution/Restrictions Comments: watch HR with exertion, watch BP and O2 Restrictions Weight Bearing Restrictions Per Provider Order: No     Mobility  Bed Mobility               General bed mobility comments: Pt in recliner on arrival    Transfers Overall transfer level: Modified independent Equipment used: Rolling walker (2 wheels) Transfers: Sit to/from Stand Sit to Stand: Modified independent (Device/Increase time)                Ambulation/Gait Ambulation/Gait assistance: Supervision Gait Distance (Feet): 250 Feet Assistive device:  Rolling walker (2 wheels), None Gait Pattern/deviations: Step-through pattern, Decreased stride length, Trunk flexed Gait velocity: decreased     General Gait Details: attempted a few steps without UE support, but pt reaching out to environment so used the RW for the remainder of the trial. Steady gait with cues for upright posture   Stairs             Wheelchair Mobility     Tilt Bed    Modified Rankin (Stroke Patients Only)       Balance Overall balance assessment: Needs assistance Sitting-balance support: No upper extremity supported, Feet supported Sitting balance-Leahy Scale: Good     Standing balance support: During functional activity, Bilateral upper extremity supported, No upper extremity supported Standing balance-Leahy Scale: Poor Standing balance comment: better with RW for dynamic bal, static stand is steady.                            Communication Communication Communication: No apparent difficulties  Cognition Arousal: Alert Behavior During Therapy: WFL for tasks assessed/performed   PT - Cognitive impairments: No apparent impairments                         Following commands: Intact      Cueing Cueing Techniques: Verbal cues  Exercises      General Comments General comments (skin integrity, edema, etc.): VSS on RA      Pertinent Vitals/Pain Pain Assessment Pain Assessment: No/denies pain    Home Living  Prior Function            PT Goals (current goals can now be found in the care plan section) Progress towards PT goals: Progressing toward goals    Frequency    Min 3X/week      PT Plan      Co-evaluation              AM-PAC PT 6 Clicks Mobility   Outcome Measure  Help needed turning from your back to your side while in a flat bed without using bedrails?: None Help needed moving from lying on your back to sitting on the side of a flat bed without using  bedrails?: None Help needed moving to and from a bed to a chair (including a wheelchair)?: None Help needed standing up from a chair using your arms (e.g., wheelchair or bedside chair)?: None Help needed to walk in hospital room?: A Little Help needed climbing 3-5 steps with a railing? : A Little 6 Click Score: 22    End of Session Equipment Utilized During Treatment: Gait belt Activity Tolerance: Patient tolerated treatment well Patient left: in chair;with call bell/phone within reach;with family/visitor present Nurse Communication: Mobility status PT Visit Diagnosis: Unsteadiness on feet (R26.81);Other abnormalities of gait and mobility (R26.89);Other symptoms and signs involving the nervous system (R29.898)     Time: 1040-1100 PT Time Calculation (min) (ACUTE ONLY): 20 min  Charges:    $Gait Training: 8-22 mins PT General Charges $$ ACUTE PT VISIT: 1 Visit                     Sueellen NOVAK, PT, DPT Acute Rehab Services [phone]    Edmon Magid 04/28/2024, 3:43 PM

## 2024-04-28 NOTE — Telephone Encounter (Addendum)
 Daughter, Ms. Rayfield called to request return call to discuss her mother's care/situation going forward (working on getting her in next week for f/u). Scheduling message has been sent. [phone] Per Dr. Cloretta: Her current medical issues are unrelated to her cancer. Plans to see her next week and continue her [employer] .

## 2024-04-28 NOTE — Progress Notes (Signed)
 Occupational Therapy Treatment Patient Details Name: [name] MRN: [id number] DOB: 1934-01-11 Today's Date: 04/28/2024   History of present illness The pt is an 89 yo female presenting to [facility] on 04/22/24 for SOB, bradycardia, and hyperkalemia. Further workup for Acute Renal Failure. While admitted pt developed R sided weakness, MRI demonstrated L frontal ischemia. Pt recently admitted 7/4 with bilateral pleural effusions. PMH includes: stage IV breast cancer (widely metastatic to lung, liver, spine, ribs, peritoneum) which she is going through treatment for currently, PAF not on AC, chronic HFpEF, HTN, CKD stage IIIb, anemia, HTN, recent admission 2/28 -3/04 for new onset PAF with RVR.   OT comments  Pt reports she should be prepping for DC tomorrow. Educated pt on energy conservation strategies with examples of how to incorporate them during her functional mobility and ADLs. She verbalized understanding of all education, still has a congested cough. Reinforced use of Acapella and guided pt with verbal cues of effective use. OT to continue to progress pt as able, HR bradycardic dipping 42-50 bpm while rested.       If plan is discharge home, recommend the following:  Assist for transportation;Assistance with cooking/housework   Equipment Recommendations  Other (comment) (Pt has rec DME)    Recommendations for Other Services      Precautions / Restrictions Precautions Precautions: Fall Recall of Precautions/Restrictions: Intact Precaution/Restrictions Comments: watch HR with exertion, watch BP and O2 Restrictions Weight Bearing Restrictions Per Provider Order: No       Mobility Bed Mobility               General bed mobility comments: Pt in recliner on arrival    Transfers                         Balance                                           ADL either performed or assessed with clinical judgement   ADL                                          General ADL Comments: Focused session on education on the 4ps of energy conservation with pt. Also reinforced use of Acapella    Extremity/Trunk Assessment              Vision       Perception     Praxis     Communication Communication Communication: No apparent difficulties   Cognition Arousal: Alert Behavior During Therapy: WFL for tasks assessed/performed Cognition: No apparent impairments                               Following commands: Intact        Cueing   Cueing Techniques: Verbal cues  Exercises      Shoulder Instructions       General Comments VSS on RA    Pertinent Vitals/ Pain       Pain Assessment Pain Assessment: No/denies pain  Home Living  Prior Functioning/Environment              Frequency  Min 2X/week        Progress Toward Goals  OT Goals(current goals can now be found in the care plan section)  Progress towards OT goals: Progressing toward goals  Acute Rehab OT Goals Patient Stated Goal: Hopeful to go home OT Goal Formulation: With patient Time For Goal Achievement: 05/08/24 Potential to Achieve Goals: Good  Plan      Co-evaluation                 AM-PAC OT 6 Clicks Daily Activity     Outcome Measure   Help from another person eating meals?: None Help from another person taking care of personal grooming?: A Little Help from another person toileting, which includes using toliet, bedpan, or urinal?: A Little Help from another person bathing (including washing, rinsing, drying)?: A Little Help from another person to put on and taking off regular upper body clothing?: A Little Help from another person to put on and taking off regular lower body clothing?: A Little 6 Click Score: 19    End of Session    OT Visit Diagnosis: Unsteadiness on feet (R26.81);Other symptoms and signs involving the nervous  system (R29.898);Muscle weakness (generalized) (M62.81)   Activity Tolerance Patient tolerated treatment well   Patient Left in chair;with call bell/phone within reach   Nurse Communication Mobility status        Time: 8372-8357 OT Time Calculation (min): 15 min  Charges: OT General Charges $OT Visit: 1 Visit OT Treatments $Therapeutic Activity: 8-22 mins  04/28/2024  AB, OTR/L  Acute Rehabilitation Services  Office: [phone]   [name] 04/28/2024, 5:57 PM

## 2024-04-28 NOTE — Plan of Care (Signed)
  Problem: Health Behavior/Discharge Planning: Goal: Ability to manage health-related needs will improve Outcome: Completed/Met   Problem: Education: Goal: Knowledge of General Education information will improve Description: Including pain rating scale, medication(s)/side effects and non-pharmacologic comfort measures Outcome: Completed/Met

## 2024-04-28 NOTE — Progress Notes (Signed)
 PROGRESS NOTE        PATIENT DETAILS Name: [name] Age: 89 y.o. Sex: female Date of Birth: [birth year] Admit Date: 04/22/2024 Admitting Physician Donnice JONELLE Beals, MD ERE:Cjmjijmjgjw, [name], MD  Brief Summary: Patient is a 89 y.o.  female with history of metastatic breast cancer, HTN, CKD stage IIIb, chronic HFrEF-who was sent from nephrology clinic for evaluation of hyperkalemia and AKI.  Significant events: 7/17>> admit to ICU-severe hyperkalemia-underwent emergent HD. 7/18>> RUE weakness-code stroke-MRI brain positive for acute CVA 7/19>> transferred to [facility]  Significant studies: 7/17>> CT renal stone study: No hydronephrosis, B/L lower lobe consolidation/effusion. 7/17>> CT chest: Progressive B/L lower lobe infiltrate with associated effusions. 7/18>> MRI brain: Acute infarct posterior left frontal white matter. 7/18>> TTE: EF 65-70% 7/18>> A1c: 6.1 7/19>> MRA head: No LVO, intracranial atherosclerosis-mild stenosis of left MCA M1, right PCA P1 segments. 7/19>> LDL: 20.  Significant microbiology data: None  Procedures: None  Consults: PCCM Neurology Neurology  Subjective: Some cough-no major issues-on room air this morning  Objective: Vitals: Blood pressure (!) 160/64, pulse 81, temperature (!) 97.5 F (36.4 C), temperature source Oral, resp. rate 15, weight 53.8 kg, last menstrual period 10/07/1974, SpO2 92%.   Exam: Awake/alert Chest: Few bibasilar rales-hardly any wheezing today Extremities: No edema Nonfocal exam.  Pertinent Labs/Radiology:    Latest Ref Rng & Units 04/25/2024    3:08 AM 04/23/2024    4:16 AM 04/22/2024    8:46 PM  CBC  WBC 4.0 - 10.5 K/uL 6.1  12.1  16.1   Hemoglobin 12.0 - 15.0 g/dL 9.2  9.1  8.8   Hematocrit 36.0 - 46.0 % 28.5  27.8  28.7   Platelets 150 - 400 K/uL 213  209  239     Lab Results  Component Value Date   NA 137 04/28/2024   K 4.7 04/28/2024   CL 102 04/28/2024   CO2 26  04/28/2024     Assessment/Plan: Hyperkalemia Likely secondary to worsening renal function. Required emergent dialysis on initial presentation Now resolved  AKI on CKD stage IIIb Unclear etiology of AKI but suspicion for hemodynamic mediated kidney injury Renal function has improved with supportive care-continue to follow closely. 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 Jeffrie) and stroke MD-has been started on Eliquis . Continue statin. Stroke team following  Persistent A-fib with RVR Rate controlled this morning Continue metoprolol /Cardizem  Started on Eliquis  this admit.  See above.  Elevated TSH FT4 normal-likely subclinical hypothyroidism Will hold off on starting levothyroxine (A-fib RVR a few days ago) repeat TSH in 4 to 6 weeks.  B/L PNA Since he was more congested/coughing-started on antibiotics on 7/20-reviewed prior imaging-has clear-cut infiltrates She has improved today-continue Unasyn  Zithromax  x 3 days completed.  B/L pleural effusions Probably transudative in etiology in the setting of CHF/worsening renal function Diuresis-repeat imaging in a few weeks Currently these effusions do not appear to be symptomatic.  Normocytic anemia Likely due to combination of CKD/malignancy Follow Hb  Hypokalemia Likely due to furosemide  Resolved mildly hyperkalemic today. Repeat electrolytes tomorrow.  Hypomagnesemia Replete/recheck  Transaminitis Mild Unclear etiology LFTs have essentially normalized.  HTN BP better controlled Continue Cardizem /Imdur /metoprolol /hydralazine .  Follow/optimize.  History of breast cancer with metastatic disease Prior  history of ovarian cancer Continue outpatient follow-up with oncology.  Code status:   Code Status: Limited: Do not attempt resuscitation (DNR) -DNR-LIMITED -Do Not Intubate/DNI    DVT Prophylaxis: apixaban  (ELIQUIS ) tablet 2.5 mg Start: 04/24/24 1100 apixaban  (ELIQUIS ) tablet 2.5 mg    Family Communication: Daughter-Carolyn [phone]  updated later in the day on 7/22-left VM 7/23   Disposition Plan: Status is: Inpatient Remains inpatient appropriate because: Severity of illness   Planned Discharge Destination:Home health   Diet: Diet Order             Diet regular Room service appropriate? Yes; Fluid consistency: Thin  Diet effective now                     Antimicrobial agents: Anti-infectives (From admission, onward)    Start     Dose/Rate Route Frequency Ordered Stop   04/25/24 1030  Ampicillin -Sulbactam (UNASYN ) 3 g in sodium chloride  0.9 % 100 mL IVPB        3 g 200 mL/hr over 30 Minutes Intravenous Every 12 hours 04/25/24 0944 04/30/24 1029   04/25/24 1015  azithromycin  (ZITHROMAX ) tablet 500 mg        500 mg Oral Daily 04/25/24 0917 04/27/24 0816        MEDICATIONS: Scheduled Meds:  apixaban   2.5 mg Oral BID   Chlorhexidine  Gluconate Cloth  6 each Topical Q0600   diltiazem  (CARDIZEM  CD) 24 hr capsule 360 mg  360 mg Oral Daily   guaiFENesin   600 mg Oral BID   hydrALAZINE   25 mg Oral Q8H   isosorbide  mononitrate  60 mg Oral Daily   metoprolol  tartrate  100 mg Oral BID   rosuvastatin   5 mg Oral QHS   sodium chloride  flush  10-40 mL Intracatheter Q12H   Continuous Infusions:  ampicillin -sulbactam (UNASYN ) IV 3 g (04/28/24 0958)   anticoagulant sodium citrate      PRN Meds:.alteplase , anticoagulant sodium citrate , benzonatate , docusate sodium , heparin , hydrALAZINE , ipratropium-albuterol , labetalol , lidocaine  (PF), lidocaine -prilocaine , loperamide , metoprolol  tartrate, ondansetron  (ZOFRAN ) IV, oxyCODONE , pentafluoroprop-tetrafluoroeth,  polyethylene glycol, sodium chloride  flush   I have personally reviewed following labs and imaging studies  LABORATORY DATA: CBC: Recent Labs  Lab 04/22/24 1041 04/22/24 1041 04/22/24 1612 04/22/24 1624 04/22/24 1708 04/22/24 2046 04/23/24 0416 04/25/24 0308  WBC 11.3*  --  12.6*  --   --  16.1* 12.1* 6.1  NEUTROABS 9.3*  --  9.6*  --   --   --   --   --   HGB 9.2*   < > 9.0* 9.2* 7.8* 8.8* 9.1* 9.2*  HCT 29.5*  --  29.8* 27.0* 23.0* 28.7* 27.8* 28.5*  MCV 93.7  --  96.8  --   --  96.0 89.1 91.3  PLT 228  --  259  --   --  239 209 213   < > = values in this interval not displayed.    Basic Metabolic Panel: Recent Labs  Lab 04/22/24 1041 04/22/24 1131 04/22/24 1612 04/22/24 1624 04/22/24 2046 04/23/24 0416 04/24/24 1016 04/25/24 0308 04/26/24 0446 04/27/24 0406 04/27/24 1420 04/28/24 0339  NA 137   < > 135   < > 138 136 136 136 139 136 134* 137  K 6.5*   < > 7.4*   < > 6.5* 3.8 3.3* 2.8* 2.8* 5.2* 5.2* 4.7  CL 108   < >  110   < > 112* 98 93* 96* 97* 102 99 102  CO2 15*   < > 12*  --  14* 25 30 30 30 25 26 26   GLUCOSE 131*   < > 259*   < > 160* 102* 120* 103* 124* 199* 240* 111*  BUN 45*   < > 46*   < > 46* 17 23 25* 22 27* 31* 32*  CREATININE 2.98*   < > 3.09*   < > 3.02* 1.58* 1.72* 1.72* 1.51* 1.59* 1.89* 1.99*  CALCIUM  8.4*   < > 8.0*  --  8.8* 8.6* 7.8* 8.0* 8.2* 8.1* 8.0* 8.2*  MG 1.1*  --  1.1*  --   --  2.0  --   --   --  1.3*  --  2.3  PHOS  --   --   --   --  6.0* 3.8 3.8  --   --  4.5  --   --    < > = values in this interval not displayed.    GFR: Estimated Creatinine Clearance: 16.9 mL/min (A) (by C-G formula based on SCr of 1.99 mg/dL (H)).  Liver Function Tests: Recent Labs  Lab 04/22/24 1612 04/22/24 2046 04/23/24 0416 04/24/24 1016 04/24/24 1030 04/25/24 0308 04/26/24 0446 04/27/24 0406  AST 49*  --  116*  --  44* 33 26  --   ALT 39  --  104*  --  59* 47* 36  --   ALKPHOS 118  --  200*  --  153* 136* 118  --   BILITOT 0.6  --  0.7   --  0.6 0.9 0.9  --   PROT 5.5*  --  5.8*  --  5.2* 5.3* 5.5*  --   ALBUMIN  2.5*   < > 2.7* 2.2* 2.3* 2.3* 2.3* 2.3*   < > = values in this interval not displayed.   No results for input(s): LIPASE, AMYLASE in the last 168 hours. Recent Labs  Lab 04/22/24 1947  AMMONIA 24    Coagulation Profile: Recent Labs  Lab 04/23/24 0416  INR 1.3*    Cardiac Enzymes: No results for input(s): CKTOTAL, CKMB, CKMBINDEX, TROPONINI in the last 168 hours.  BNP (last 3 results) No results for input(s): PROBNP in the last 8760 hours.  Lipid Profile: No results for input(s): CHOL, HDL, LDLCALC, TRIG, CHOLHDL, LDLDIRECT in the last 72 hours.   Thyroid  Function Tests: No results for input(s): TSH, T4TOTAL, FREET4, T3FREE, THYROIDAB in the last 72 hours.   Anemia Panel: No results for input(s): VITAMINB12, FOLATE, FERRITIN, TIBC, IRON, RETICCTPCT in the last 72 hours.  Urine analysis:    Component Value Date/Time   COLORURINE YELLOW 11/21/2023 1840   APPEARANCEUR CLEAR 11/21/2023 1840   LABSPEC 1.016 11/21/2023 1840   PHURINE 5.0 11/21/2023 1840   GLUCOSEU NEGATIVE 11/21/2023 1840   HGBUR NEGATIVE 11/21/2023 1840   BILIRUBINUR NEGATIVE 11/21/2023 1840   BILIRUBINUR n 09/15/2015 1123   KETONESUR NEGATIVE 11/21/2023 1840   PROTEINUR NEGATIVE 11/21/2023 1840   UROBILINOGEN negative 09/15/2015 1123   UROBILINOGEN 0.2 04/11/2009 1015   NITRITE NEGATIVE 11/21/2023 1840   LEUKOCYTESUR NEGATIVE 11/21/2023 1840    Sepsis Labs: Lactic Acid, Venous    Component Value Date/Time   LATICACIDVEN 1.5 04/09/2024 1342    MICROBIOLOGY: Recent Results (from the past 240 hours)  MRSA Next Gen by PCR, Nasal     Status: None   Collection Time: 04/22/24 11:06 PM  Specimen: Nasal Mucosa; Nasal Swab  Result Value Ref Range Status   MRSA by PCR Next Gen NOT DETECTED NOT DETECTED Final    Comment: (NOTE) The GeneXpert MRSA Assay (FDA approved for  NASAL specimens only), is one component of a comprehensive MRSA colonization surveillance program. It is not intended to diagnose MRSA infection nor to guide or monitor treatment for MRSA infections. Test performance is not FDA approved in patients less than 31 years old. Performed at [facility], [address], [place], [place] [address]     RADIOLOGY STUDIES/RESULTS: DG Chest 2 View Result Date: 04/27/2024 CLINICAL DATA:  Shortness of breath. EXAM: CHEST - 2 VIEW COMPARISON:  April 24, 2024. FINDINGS: The heart size and mediastinal contours are within normal limits. Mild central pulmonary vascular congestion is again noted with minimal bibasilar subsegmental atelectasis or edema and small pleural effusions. Left internal jugular dialysis catheter is unchanged. Old lower thoracic compression fracture is noted. IMPRESSION: Mild central pulmonary vascular congestion with minimal bibasilar subsegmental atelectasis or edema and small pleural effusions. Aortic Atherosclerosis (ICD10-I70.0). Electronically Signed   By: Lynwood Landy Raddle M.D.   On: 04/27/2024 13:40     LOS: 6 days   Donalda Applebaum, MD  [facility]    To contact the attending provider between 7A-7P or the covering provider during after hours 7P-7A, please log into the web site [email or web address] and access using universal [place] password for that web site. If you do not have the password, please call the hospital operator.  04/28/2024, 11:16 AM

## 2024-04-28 NOTE — Progress Notes (Signed)
 Patient ID: [name], female   DOB: Apr 01, 1934, 89 y.o.   MRN: [id number] [facility] Progress Note   Assessment/ Plan:   1. Acute kidney Injury on chronic kidney disease stage IIIb: Suspected hemodynamically mediated with need for dialysis secondary to hyperkalemia.  Renal function stable after some fluctuation following furosemide  administration 2 days ago.  Agree with restarting torsemide 20 mg tomorrow prior to discharge and will follow-up with Dr. Dolan at [facility] as scheduled.  Nephrology will sign off and remain available for questions or concerns. 2.  Hypokalemia: This was after initial management of her hyperkalemia upon admission.  She has chronic diarrhea and an element of malabsorption/hypomagnesemia that contribute to low potassium.  Potassium level normal. 3.  Bilateral pneumonia: On antimicrobial therapy with Unasyn  and azithromycin .  Still hypoxic and requiring oxygen supplementation via nasal cannula.  She has bilateral pleural effusions that are suspected to be transudative in the setting of congestive heart failure. 4.  History of metastatic breast cancer: Ongoing outpatient follow-up/management by oncology. 5.  Hypertension: Blood pressures improved status post titration of antihypertensive therapy.  Subjective:   Denies any acute events overnight, doing better with PT and shortness of breath/cough are improved.   Objective:   BP (!) 160/64 (BP Location: Right Arm)   Pulse 81   Temp (!) 97.5 F (36.4 C) (Oral)   Resp 15   Wt 53.8 kg   LMP 10/07/1974   SpO2 92%   BMI 19.14 kg/m   Intake/Output Summary (Last 24 hours) at 04/28/2024 0945 Last data filed at 04/28/2024 0800 Gross per 24 hour  Intake 770 ml  Output 450 ml  Net 320 ml   Weight change: 0 kg  Physical Exam: Gen: Chronically ill-appearing woman, sitting up in bed, speaking on cell phone CVS: Pulse regular tachycardia, S1 and S2 normal.  Temporary left IJ HD catheter Resp:  Coarse breath sounds bilaterally with some rales over bases Abd: Soft, flat, nontender, bowel sounds normal Ext: No lower extremity edema  Imaging: DG Chest 2 View Result Date: 04/27/2024 CLINICAL DATA:  Shortness of breath. EXAM: CHEST - 2 VIEW COMPARISON:  April 24, 2024. FINDINGS: The heart size and mediastinal contours are within normal limits. Mild central pulmonary vascular congestion is again noted with minimal bibasilar subsegmental atelectasis or edema and small pleural effusions. Left internal jugular dialysis catheter is unchanged. Old lower thoracic compression fracture is noted. IMPRESSION: Mild central pulmonary vascular congestion with minimal bibasilar subsegmental atelectasis or edema and small pleural effusions. Aortic Atherosclerosis (ICD10-I70.0). Electronically Signed   By: Lynwood Landy Raddle M.D.   On: 04/27/2024 13:40    Labs: BMET Recent Labs  Lab 04/22/24 2046 04/23/24 0416 04/24/24 1016 04/25/24 0308 04/26/24 0446 04/27/24 0406 04/27/24 1420 04/28/24 0339  NA 138 136 136 136 139 136 134* 137  K 6.5* 3.8 3.3* 2.8* 2.8* 5.2* 5.2* 4.7  CL 112* 98 93* 96* 97* 102 99 102  CO2 14* 25 30 30 30 25 26 26   GLUCOSE 160* 102* 120* 103* 124* 199* 240* 111*  BUN 46* 17 23 25* 22 27* 31* 32*  CREATININE 3.02* 1.58* 1.72* 1.72* 1.51* 1.59* 1.89* 1.99*  CALCIUM  8.8* 8.6* 7.8* 8.0* 8.2* 8.1* 8.0* 8.2*  PHOS 6.0* 3.8 3.8  --   --  4.5  --   --    CBC Recent Labs  Lab 04/22/24 1041 04/22/24 1612 04/22/24 1624 04/22/24 1708 04/22/24 2046 04/23/24 0416 04/25/24 0308  WBC 11.3* 12.6*  --   --  16.1* 12.1* 6.1  NEUTROABS 9.3* 9.6*  --   --   --   --   --   HGB 9.2* 9.0*   < > 7.8* 8.8* 9.1* 9.2*  HCT 29.5* 29.8*   < > 23.0* 28.7* 27.8* 28.5*  MCV 93.7 96.8  --   --  96.0 89.1 91.3  PLT 228 259  --   --  239 209 213   < > = values in this interval not displayed.    Medications:     apixaban   2.5 mg Oral BID   Chlorhexidine  Gluconate Cloth  6 each Topical Q0600    diltiazem  (CARDIZEM  CD) 24 hr capsule 360 mg  360 mg Oral Daily   guaiFENesin   600 mg Oral BID   hydrALAZINE   25 mg Oral Q8H   isosorbide  mononitrate  60 mg Oral Daily   metoprolol  tartrate  100 mg Oral BID   rosuvastatin   5 mg Oral QHS   sodium chloride  flush  10-40 mL Intracatheter Q12H    Gordy Blanch, MD 04/28/2024, 9:45 AM

## 2024-04-29 ENCOUNTER — Other Ambulatory Visit: Payer: Self-pay | Admitting: *Deleted

## 2024-04-29 ENCOUNTER — Encounter: Payer: Self-pay | Admitting: Oncology

## 2024-04-29 DIAGNOSIS — N179 Acute kidney failure, unspecified: Secondary | ICD-10-CM | POA: Diagnosis not present

## 2024-04-29 DIAGNOSIS — Z170 Estrogen receptor positive status [ER+]: Secondary | ICD-10-CM | POA: Diagnosis not present

## 2024-04-29 DIAGNOSIS — I639 Cerebral infarction, unspecified: Secondary | ICD-10-CM | POA: Diagnosis not present

## 2024-04-29 DIAGNOSIS — C50412 Malignant neoplasm of upper-outer quadrant of left female breast: Secondary | ICD-10-CM

## 2024-04-29 DIAGNOSIS — C50919 Malignant neoplasm of unspecified site of unspecified female breast: Secondary | ICD-10-CM | POA: Diagnosis not present

## 2024-04-29 LAB — BASIC METABOLIC PANEL WITH GFR
Anion gap: 11 (ref 5–15)
BUN: 33 mg/dL — ABNORMAL HIGH (ref 8–23)
CO2: 23 mmol/L (ref 22–32)
Calcium: 8.1 mg/dL — ABNORMAL LOW (ref 8.9–10.3)
Chloride: 102 mmol/L (ref 98–111)
Creatinine, Ser: 2.06 mg/dL — ABNORMAL HIGH (ref 0.44–1.00)
GFR, Estimated: 23 mL/min — ABNORMAL LOW (ref >60)
Glucose, Bld: 122 mg/dL — ABNORMAL HIGH (ref 70–99)
Potassium: 4.1 mmol/L (ref 3.5–5.1)
Sodium: 136 mmol/L (ref 135–145)

## 2024-04-29 LAB — GLUCOSE, CAPILLARY: Glucose-Capillary: 147 mg/dL — ABNORMAL HIGH (ref 70–99)

## 2024-04-29 NOTE — Progress Notes (Addendum)
   04/29/24 0833  Mobility  Activity Ambulated with assistance in hallway  Level of Assistance Standby assist, set-up cues, supervision of patient - no hands on  Assistive Device Front wheel walker  Distance Ambulated (ft) 500 ft  Activity Response Tolerated fair  Mobility Referral Yes  Mobility visit 1 Mobility  Mobility Specialist Start Time (ACUTE ONLY) [phone]  Mobility Specialist Stop Time (ACUTE ONLY) 0857  Mobility Specialist Time Calculation (min) (ACUTE ONLY) 24 min   Mobility Specialist: Progress Note  Pre-Mobility:      HR  73, SpO2 97% 4L During Mobility: HR  89-94 Post-Mobility:    HR 74, SpO2 96% 4L  Pt agreeable to mobility session - received in chair. Pt was asymptomatic throughout session with no complaints. Returned to chair with all needs met - call bell within reach.   Virgle Boards, BS Mobility Specialist Please contact via SecureChat or  Rehab office at [phone].

## 2024-04-29 NOTE — Plan of Care (Signed)
  Problem: Clinical Measurements: Goal: Ability to maintain clinical measurements within normal limits will improve Outcome: Progressing Goal: Will remain free from infection Outcome: Progressing Goal: Diagnostic test results will improve Outcome: Progressing Goal: Respiratory complications will improve Outcome: Progressing   Problem: Activity: Goal: Risk for activity intolerance will decrease Outcome: Progressing   Problem: Nutrition: Goal: Adequate nutrition will be maintained Outcome: Progressing   Problem: Education: Goal: Knowledge of disease or condition will improve Outcome: Progressing Goal: Knowledge of secondary prevention will improve (MUST DOCUMENT ALL) Outcome: Progressing Goal: Knowledge of patient specific risk factors will improve (DELETE if not current risk factor) Outcome: Progressing

## 2024-04-29 NOTE — Progress Notes (Signed)
 Physical Therapy Treatment Patient Details Name: [name] MRN: [id number] DOB: 03/12/1934 Today's Date: 04/29/2024   History of Present Illness The pt is an 89 yo female presenting to [facility] for SOB, bradycardia, and hyperkalemia. Further workup for Acute Renal Failure. While admitted pt developed R sided weakness, MRI demonstrated L frontal ischemia. Pt recently admitted 7/4 with bilateral pleural effusions. PMH includes: stage IV breast cancer (widely metastatic to lung, liver, spine, ribs, peritoneum) which she is going through treatment for currently, PAF not on AC, chronic HFpEF, HTN, CKD stage IIIb, anemia, HTN, recent admission 2/28 -3/04 for new onset PAF with RVR.    PT Comments  Pt tolerated treatment well today. Pt with similar presentation to previous session. No change in DC/DME recs at this time. PT will continue to follow.     If plan is discharge home, recommend the following: A little help with walking and/or transfers;A little help with bathing/dressing/bathroom;Assistance with cooking/housework;Assist for transportation;Help with stairs or ramp for entrance   Can travel by private vehicle        Equipment Recommendations  None recommended by PT    Recommendations for Other Services       Precautions / Restrictions Precautions Precautions: Fall Recall of Precautions/Restrictions: Intact Precaution/Restrictions Comments: watch HR with exertion, watch BP and O2 Restrictions Weight Bearing Restrictions Per Provider Order: No     Mobility  Bed Mobility               General bed mobility comments: Pt in recliner on arrival    Transfers Overall transfer level: Modified independent Equipment used: Rolling walker (2 wheels) Transfers: Sit to/from Stand Sit to Stand: Modified independent (Device/Increase time)                Ambulation/Gait Ambulation/Gait assistance: Supervision Gait Distance (Feet): 250 Feet Assistive device:  Rolling walker (2 wheels) Gait Pattern/deviations: Step-through pattern, Decreased stride length, Trunk flexed Gait velocity: decreased     General Gait Details: Pt required 1 seated rest break in the hallway. SpO2 96% on RA.   Stairs             Wheelchair Mobility     Tilt Bed    Modified Rankin (Stroke Patients Only)       Balance Overall balance assessment: Needs assistance Sitting-balance support: No upper extremity supported, Feet supported Sitting balance-Leahy Scale: Good     Standing balance support: During functional activity, Bilateral upper extremity supported, No upper extremity supported Standing balance-Leahy Scale: Fair Standing balance comment: better with RW for dynamic bal, static stand is steady.                            Communication Communication Communication: No apparent difficulties  Cognition Arousal: Alert Behavior During Therapy: WFL for tasks assessed/performed   PT - Cognitive impairments: No apparent impairments                         Following commands: Intact      Cueing Cueing Techniques: Verbal cues  Exercises      General Comments General comments (skin integrity, edema, etc.): SpO2 at 96%      Pertinent Vitals/Pain Pain Assessment Pain Assessment: No/denies pain    Home Living                          Prior Function  PT Goals (current goals can now be found in the care plan section) Progress towards PT goals: Progressing toward goals    Frequency    Min 3X/week      PT Plan      Co-evaluation              AM-PAC PT 6 Clicks Mobility   Outcome Measure  Help needed turning from your back to your side while in a flat bed without using bedrails?: None Help needed moving from lying on your back to sitting on the side of a flat bed without using bedrails?: None Help needed moving to and from a bed to a chair (including a wheelchair)?: None Help  needed standing up from a chair using your arms (e.g., wheelchair or bedside chair)?: None Help needed to walk in hospital room?: A Little Help needed climbing 3-5 steps with a railing? : A Little 6 Click Score: 22    End of Session Equipment Utilized During Treatment: Gait belt Activity Tolerance: Patient tolerated treatment well Patient left: in chair;with call bell/phone within reach;with family/visitor present Nurse Communication: Mobility status PT Visit Diagnosis: Unsteadiness on feet (R26.81);Other abnormalities of gait and mobility (R26.89);Other symptoms and signs involving the nervous system (R29.898)     Time: 8544-8484 PT Time Calculation (min) (ACUTE ONLY): 20 min  Charges:    $Gait Training: 8-22 mins PT General Charges $$ ACUTE PT VISIT: 1 Visit                     Sueellen NOVAK, PT, DPT Acute Rehab Services [phone]    Maigan Bittinger 04/29/2024, 3:48 PM

## 2024-04-29 NOTE — TOC Progression Note (Signed)
 Transition of Care [facility]) - Progression Note    Patient Details  Name: [name] MRN: [id number] Date of Birth: 02-12-1934  [facility] CM/SW Contact  Dozier Erminio CROME, [facility] Care Management Supervisor [phone] Phone Number: 04/29/2024, 9:42 AM  Clinical Narrative:    Received vm from daughter Elveria [phone]) requesting an update on the patient. Talked to patient at the bedside, she gave me permission to talk to her daughter. Telephone call to dCarolyn at the patient's bedside. All questions answered. HHC - resumption of services with [facility]. Telephone call to [place] to call the daughter. Elveria stated that she has concerns for her mother living alone. CM informed Elveria that she can have a private caregiver for assistance - patient does not want this at this time.   Expected Discharge Plan: Home w Home Health Services Barriers to Discharge: Continued Medical Work up               Expected Discharge Plan and Services    Home with HHC Services - [facility] following   Living arrangements for the past 2 months: Single Family Home                                       Social Drivers of Health (SDOH) Interventions SDOH Screenings   Food Insecurity: Patient Declined (04/26/2024)  Housing: Unknown (04/26/2024)  Transportation Needs: Patient Declined (04/26/2024)  Utilities: Patient Declined (04/26/2024)  Depression (PHQ2-9): Low Risk  (04/22/2024)  Social Connections: Patient Declined (04/26/2024)  Recent Concern: Social Connections - Moderately Isolated (04/09/2024)  Tobacco Use: Low Risk  (04/22/2024)    Readmission Risk Interventions    04/12/2024   11:59 AM 12/09/2023   12:21 PM 11/23/2023   11:56 AM  Readmission Risk Prevention Plan  Transportation Screening Complete Complete Complete  PCP or Specialist Appt within 5-7 Days   Complete  Home Care Screening   Complete   Medication Review (RN CM)   Complete  Medication Review (RN Care Manager) Referral to Pharmacy    HRI or Home Care Consult Complete    SW Recovery Care/Counseling Consult Complete    Palliative Care Screening Not Applicable    Skilled Nursing Facility Not Applicable Complete

## 2024-04-29 NOTE — Progress Notes (Signed)
 [name] 4T95 [facility] liaison note:   This is a current patient with [facility], followed for outpatient palliative care.    [facility] will continue to follow for discharge disposition.   Please call with any hospice or OPP related questions or concerns.   Thank you, Eleanor Nail, LPN [phone]

## 2024-04-29 NOTE — Progress Notes (Signed)
 PROGRESS NOTE        PATIENT DETAILS Name: [name] Age: 89 y.o. Sex: female Date of Birth: 1934-02-28 Admit Date: 04/22/2024 Admitting Physician Donnice JONELLE Beals, MD ERE:Cjmjijmjgjw, [name], MD  Brief Summary: Patient is a 89 y.o.  female with history of metastatic breast cancer, HTN, CKD stage IIIb, chronic HFrEF-who was sent from nephrology clinic for evaluation of hyperkalemia and AKI.  Significant events: 7/17>> admit to ICU-severe hyperkalemia-underwent emergent HD. 7/18>> RUE weakness-code stroke-MRI brain positive for acute CVA 7/19>> transferred to [facility]  Significant studies: 7/17>> CT renal stone study: No hydronephrosis, B/L lower lobe consolidation/effusion. 7/17>> CT chest: Progressive B/L lower lobe infiltrate with associated effusions. 7/18>> MRI brain: Acute infarct posterior left frontal white matter. 7/18>> TTE: EF 65-70% 7/18>> A1c: 6.1 7/19>> MRA head: No LVO, intracranial atherosclerosis-mild stenosis of left MCA M1, right PCA P1 segments. 7/19>> LDL: 20.  Significant microbiology data: None  Procedures: None  Consults: PCCM Neurology Neurology  Subjective: No major issues overnight-inquiring about discharge.  On room air this morning.  Objective: Vitals: Blood pressure (!) 158/72, pulse 74, temperature (!) 97.5 F (36.4 C), temperature source Oral, resp. rate 17, weight 55.4 kg, last menstrual period 10/07/1974, SpO2 97%.   Exam: Awake/alert Chest: Clear to auscultation Extremities: No edema Nonfocal exam.  Pertinent Labs/Radiology:    Latest Ref Rng & Units 04/25/2024    3:08 AM 04/23/2024    4:16 AM 04/22/2024    8:46 PM  CBC  WBC 4.0 - 10.5 K/uL 6.1  12.1  16.1   Hemoglobin 12.0 - 15.0 g/dL 9.2  9.1  8.8   Hematocrit 36.0 - 46.0 % 28.5  27.8  28.7   Platelets 150 - 400 K/uL 213  209  239     Lab Results  Component Value Date   NA 136 04/29/2024   K 4.1 04/29/2024   CL 102 04/29/2024   CO2  23 04/29/2024     Assessment/Plan: Hyperkalemia Likely secondary to worsening renal function. Required emergent dialysis on initial presentation Now resolved  AKI on CKD stage IIIb Unclear etiology of AKI but suspicion for hemodynamic mediated kidney injury Renal function had improved but slight uptick over the past several days after a dose of Lasix  She is euvolemic-no further doses of Lasix  for now-watch 1 more day-if renal function plateaus/stabilizes-likely home on 7/25.  Acute on chronic HFpEF Volume status stable Hold off diuretics due to bump in creatinine Discussed with nephrology-Demadex 20 mg to be started when closer to discharge.  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 Jeffrie) and stroke MD-has been started on Eliquis . Continue statin. Stroke team following  Persistent A-fib with RVR Rate controlled this morning Continue metoprolol /Cardizem  Started on Eliquis  this admit.  See above.  Elevated TSH FT4 normal-likely subclinical hypothyroidism Will hold off on starting levothyroxine (A-fib RVR a few days ago) repeat TSH in 4 to 6 weeks.  B/L PNA Since he was more congested/coughing-started on antibiotics on 7/20-reviewed prior imaging-has clear-cut infiltrates She has improved today-continue Unasyn  Zithromax  x 3 days completed.  B/L pleural effusions Probably transudative in etiology in the setting of CHF/worsening renal function Diuresis-repeat imaging in a few weeks Currently these effusions do not appear to be symptomatic.  Normocytic anemia Likely due to combination of CKD/malignancy  Follow Hb  Hypokalemia Likely due to furosemide  Repleted/normalized.  Hypomagnesemia Repeat needed.  Transaminitis Mild Unclear etiology LFTs have essentially normalized.  HTN BP better controlled Continue  Cardizem /Imdur /metoprolol /hydralazine .  Follow/optimize.  History of breast cancer with metastatic disease Prior history of ovarian cancer Continue outpatient follow-up with oncology.  Code status:   Code Status: Limited: Do not attempt resuscitation (DNR) -DNR-LIMITED -Do Not Intubate/DNI    DVT Prophylaxis: apixaban  (ELIQUIS ) tablet 2.5 mg Start: 04/24/24 1100 apixaban  (ELIQUIS ) tablet 2.5 mg    Family Communication: Daughter-Carolyn [phone] VM 7/24   Disposition Plan: Status is: Inpatient Remains inpatient appropriate because: Severity of illness   Planned Discharge Destination:Home health   Diet: Diet Order             Diet regular Room service appropriate? Yes; Fluid consistency: Thin  Diet effective now                     Antimicrobial agents: Anti-infectives (From admission, onward)    Start     Dose/Rate Route Frequency Ordered Stop   04/25/24 1030  Ampicillin -Sulbactam (UNASYN ) 3 g in sodium chloride  0.9 % 100 mL IVPB        3 g 200 mL/hr over 30 Minutes Intravenous Every 12 hours 04/25/24 0944 04/30/24 1029   04/25/24 1015  azithromycin  (ZITHROMAX ) tablet 500 mg        500 mg Oral Daily 04/25/24 0917 04/27/24 0816        MEDICATIONS: Scheduled Meds:  apixaban   2.5 mg Oral BID   Chlorhexidine  Gluconate Cloth  6 each Topical Q0600   diltiazem  (CARDIZEM  CD) 24 hr capsule 360 mg  360 mg Oral Daily   guaiFENesin   600 mg Oral BID   hydrALAZINE   25 mg Oral Q8H   isosorbide  mononitrate  60 mg Oral Daily   metoprolol  tartrate  100 mg Oral BID   rosuvastatin   5 mg Oral QHS   sodium chloride  flush  10-40 mL Intracatheter Q12H   Continuous Infusions:  ampicillin -sulbactam (UNASYN ) IV 3 g (04/29/24 0940)   anticoagulant sodium citrate      PRN Meds:.alteplase , anticoagulant sodium citrate , benzonatate , docusate sodium , heparin , hydrALAZINE , ipratropium-albuterol , labetalol , lidocaine  (PF), lidocaine -prilocaine , loperamide , metoprolol   tartrate, ondansetron  (ZOFRAN ) IV, oxyCODONE , pentafluoroprop-tetrafluoroeth, polyethylene glycol, sodium chloride  flush   I have personally reviewed following labs and imaging studies  LABORATORY DATA: CBC: Recent Labs  Lab 04/22/24 1612 04/22/24 1624 04/22/24 1708 04/22/24 2046 04/23/24 0416 04/25/24 0308  WBC 12.6*  --   --  16.1* 12.1* 6.1  NEUTROABS 9.6*  --   --   --   --   --   HGB 9.0* 9.2* 7.8* 8.8* 9.1* 9.2*  HCT 29.8* 27.0* 23.0* 28.7* 27.8* 28.5*  MCV 96.8  --   --  96.0 89.1 91.3  PLT 259  --   --  239 209 213    Basic Metabolic Panel: Recent Labs  Lab 04/22/24 1612 04/22/24 1624 04/22/24 2046 04/23/24 0416 04/24/24 1016 04/25/24 0308 04/26/24 0446 04/27/24 0406 04/27/24 1420 04/28/24 0339 04/29/24 0347  NA 135   < > 138 136 136   < > 139 136 134* 137 136  K 7.4*   < > 6.5* 3.8 3.3*   < > 2.8* 5.2* 5.2* 4.7 4.1  CL 110   < > 112* 98 93*   < > 97* 102 99 102 102  CO2 12*  --  14* 25 30   < > 30 25 26  26  23  GLUCOSE 259*   < > 160* 102* 120*   < > 124* 199* 240* 111* 122*  BUN 46*   < > 46* 17 23   < > 22 27* 31* 32* 33*  CREATININE 3.09*   < > 3.02* 1.58* 1.72*   < > 1.51* 1.59* 1.89* 1.99* 2.06*  CALCIUM  8.0*  --  8.8* 8.6* 7.8*   < > 8.2* 8.1* 8.0* 8.2* 8.1*  MG 1.1*  --   --  2.0  --   --   --  1.3*  --  2.3  --   PHOS  --   --  6.0* 3.8 3.8  --   --  4.5  --   --   --    < > = values in this interval not displayed.    GFR: Estimated Creatinine Clearance: 16.8 mL/min (A) (by C-G formula based on SCr of 2.06 mg/dL (H)).  Liver Function Tests: Recent Labs  Lab 04/22/24 1612 04/22/24 2046 04/23/24 0416 04/24/24 1016 04/24/24 1030 04/25/24 0308 04/26/24 0446 04/27/24 0406  AST 49*  --  116*  --  44* 33 26  --   ALT 39  --  104*  --  59* 47* 36  --   ALKPHOS 118  --  200*  --  153* 136* 118  --   BILITOT 0.6  --  0.7  --  0.6 0.9 0.9  --   PROT 5.5*  --  5.8*  --  5.2* 5.3* 5.5*  --   ALBUMIN  2.5*   < > 2.7* 2.2* 2.3* 2.3* 2.3* 2.3*   <  > = values in this interval not displayed.   No results for input(s): LIPASE, AMYLASE in the last 168 hours. Recent Labs  Lab 04/22/24 1947  AMMONIA 24    Coagulation Profile: Recent Labs  Lab 04/23/24 0416  INR 1.3*    Cardiac Enzymes: No results for input(s): CKTOTAL, CKMB, CKMBINDEX, TROPONINI in the last 168 hours.  BNP (last 3 results) No results for input(s): PROBNP in the last 8760 hours.  Lipid Profile: No results for input(s): CHOL, HDL, LDLCALC, TRIG, CHOLHDL, LDLDIRECT in the last 72 hours.   Thyroid  Function Tests: No results for input(s): TSH, T4TOTAL, FREET4, T3FREE, THYROIDAB in the last 72 hours.   Anemia Panel: No results for input(s): VITAMINB12, FOLATE, FERRITIN, TIBC, IRON, RETICCTPCT in the last 72 hours.  Urine analysis:    Component Value Date/Time   COLORURINE YELLOW 11/21/2023 1840   APPEARANCEUR CLEAR 11/21/2023 1840   LABSPEC 1.016 11/21/2023 1840   PHURINE 5.0 11/21/2023 1840   GLUCOSEU NEGATIVE 11/21/2023 1840   HGBUR NEGATIVE 11/21/2023 1840   BILIRUBINUR NEGATIVE 11/21/2023 1840   BILIRUBINUR n 09/15/2015 1123   KETONESUR NEGATIVE 11/21/2023 1840   PROTEINUR NEGATIVE 11/21/2023 1840   UROBILINOGEN negative 09/15/2015 1123   UROBILINOGEN 0.2 04/11/2009 1015   NITRITE NEGATIVE 11/21/2023 1840   LEUKOCYTESUR NEGATIVE 11/21/2023 1840    Sepsis Labs: Lactic Acid, Venous    Component Value Date/Time   LATICACIDVEN 1.5 04/09/2024 1342    MICROBIOLOGY: Recent Results (from the past 240 hours)  MRSA Next Gen by PCR, Nasal     Status: None   Collection Time: 04/22/24 11:06 PM   Specimen: Nasal Mucosa; Nasal Swab  Result Value Ref Range Status   MRSA by PCR Next Gen NOT DETECTED NOT DETECTED Final    Comment: (NOTE) The GeneXpert MRSA Assay (FDA approved for NASAL specimens only), is  one component of a comprehensive MRSA colonization surveillance program. It is not intended to  diagnose MRSA infection nor to guide or monitor treatment for MRSA infections. Test performance is not FDA approved in patients less than 62 years old. Performed at [facility], [address], [place], [place] [address]     RADIOLOGY STUDIES/RESULTS: No results found.    LOS: 7 days   Donalda Applebaum, MD  [facility]    To contact the attending provider between 7A-7P or the covering provider during after hours 7P-7A, please log into the web site [email or web address] and access using universal [place] password for that web site. If you do not have the password, please call the hospital operator.  04/29/2024, 11:49 AM

## 2024-04-29 NOTE — Plan of Care (Signed)
   Problem: Clinical Measurements: Goal: Ability to maintain clinical measurements within normal limits will improve Outcome: Progressing Goal: Will remain free from infection Outcome: Progressing Goal: Diagnostic test results will improve Outcome: Progressing Goal: Respiratory complications will improve Outcome: Progressing   Problem: Activity: Goal: Risk for activity intolerance will decrease Outcome: Progressing   Problem: Nutrition: Goal: Adequate nutrition will be maintained Outcome: Progressing

## 2024-04-29 NOTE — Progress Notes (Signed)
 IP PROGRESS NOTE  Subjective:   [name] is well-known to me with a history of static breast cancer, currently maintained on monthly Faslodex .  She was admitted 04/22/2024 with hyperkalemia and bradycardia.  She underwent emergent dialysis for management of hyperkalemia. 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  She had right arm weakness.  This has resolved.  She is followed by nephrology for management of acute/chronic renal failure.  She was diagnosed with pneumonia on chest imaging during this hospital admission.  She reports feeling better.  She hopes to be discharged to home within the next few days.  Back pain remains improved. Objective: Vital signs in last 24 hours: Blood pressure (!) 158/72, pulse 74, temperature (!) 97.5 F (36.4 C), temperature source Oral, resp. rate 17, weight 122 lb 2.2 oz (55.4 kg), last menstrual period 10/07/1974, SpO2 97%.  Intake/Output from previous day: 07/23 0701 - 07/24 0700 In: 690 [P.O.:480; I.V.:10; IV Piggyback:200] Out: 750 [Urine:750]  Physical Exam Lungs: Distant breath sounds, no respiratory distress Cardiac: Regular rhythm Abdomen: No hepatosplenomegaly Extremities: Trace pedal edema Breast: 2 cm mass at the upper outer left breast Lymph nodes: No cervical, supraclavicular, or axillary nodes  Lab Results: No results for input(s): WBC, HGB, HCT, PLT in the last 72 hours.  BMET Recent Labs    04/28/24 0339 04/29/24 0347  NA 137 136  K 4.7 4.1  CL 102 102  CO2 26 23  GLUCOSE 111* 122*  BUN 32* 33*  CREATININE 1.99* 2.06*  CALCIUM  8.2* 8.1*    Lab Results  Component Value Date   CA125 12 07/15/2016    Studies/Results: No results found.  Medications: I have reviewed the patient's current medications.  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 Remote hysterectomy and bilateral salpingo-oophorectomy with the cytology from 1976 confirming numerous psammoma bodies. 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  Status post a needle localized lumpectomy and sentinel lymph node biopsy 04/13/2009 with the pathology confirming high-grade ductal carcinoma in situ with an associated 0.26 mm invasive carcinoma, ER positive, PR positive, and HER2 positive. Status post adjuvant right breast radiation completed 06/19/2009. Initiation of Arimidex  05/01/2009.  Completed 5 years in July 2015 Family history of breast cancer. History of Clostridium difficile colitis. Frequent bowel movements following the bowel resection in 2009. 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 CTs 10/16/2023-innumerable lung nodules both lungs; multiple new ill-defined low-density liver lesions; bony metastatic disease in the thoracic and lumbar spine.  Severe compression fracture at T11 with osseous retropulsion.  Small right greater than left pleural effusions. Biopsy liver lesion 10/30/2023-metastatic moderate to poorly differentiated adenocarcinoma consistent with breast origin; HER2 negative (1+), ER +100%, PR +30%, Ki-67 60%, ESR 1 nutation positive, PIK3CA negative Faslodex /Abemaciclib  11/07/2023, Abemaciclib  discontinued on hospital  admission 11/21/2023 Faslodex  12/12/2023, 12/26/2023, 01/08/2024, 02/26/2024, 03/26/2024, 04/22/2024  CT 03/25/2024-significant decrease in size and number of diffuse millimetric pulmonary nodules/metastasis.  Marked improvement in appearance of hepatic metastasis.  Superior left breast or left chest wall nodule new since 10/16/2023. Faslodex  continued CT chest 04/22/2024-stable parenchymal nodules, progressive bilateral lower lobe infiltrates/effusions Elevated platelet count-essential thrombocytosis 07/31/22- postitive [id number] 03/05/2023-hydroxyurea  500 mg Monday, Wednesday, and Friday Hydroxyurea  discontinued March 2025 CHEK2 pathogenic mutation positive Severe back pain-MRI 10/09/2023 with compression fracture at T11, abnormal signal T9, T10, T11 and T12 as well as several adjacent ribs; right greater than left pleural effusion Palliative radiation to the lower thoracic, lumbar, and upper sacrum 11/18/2023-12/03/2023 12.  Admission 11/21/2023 with acute renal failure, elevated liver enzymes, dehydration, and severe back pain Renal failure and elevated liver enzymes improved, discharged 12/04/2023 13.  Anemia/thrombocytopenia-likely secondary to Abemaciclib , metastatic breast cancer involving the bone marrow, and radiation 14.  Admission 12/05/2023 with chest pain and rapid atrial fibrillation 15.  Admission 12/23/2023 with rapid atrial fibrillation and CHF 16.  Anemia secondary to chronic disease, renal failure, metastatic breast cancer involving the bone marrow, phlebotomy, and chemotherapy/radiation 17.  Lower leg/foot edema with skin breakdown 02/26/2024-improved 03/26/2024 18.  Hypomagnesemia-oral magnesium  initiated 02/27/2024 19.  Admission 04/08/2024 through 04/12/2024 with rapid atrial fibrillation, CHF, pneumonia\ 20.  Admission 04/22/2024 with hyperkalemia and bradycardia-emergent hemodialysis 21.  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  Bilateral pneumonia 04/25/2024  [name] has  metastatic breast cancer.  She has been maintained on Faslodex  since January of this year.  Back pain has improved and an initial restaging CT in June revealed a decrease in lung nodules and liver metastases.  The plan is to continue Faslodex .  She has multiple comorbid conditions including renal failure, CHF, A-fib, and a recent CVA.  She plans to return home.  We will arrange for outpatient follow-up at the Cancer center coincident with the next scheduled dose of Faslodex .  We will consider alternate systemic therapy options if she develops progressive metastatic breast cancer, though treatment options will be limited by comorbid conditions. I discussed the case with her daughter by telephone at approximately 4:30 PM on 04/29/2024.  We discussed the overall poor prognosis given the metastatic breast cancer and multiple comorbid conditions.  [name] would benefit from assisted living or having someone live with her.  Her daughter reports there is no family option for home care.  They are considering an in-home assistant and hospice.  Assisted living is also being considered.  We will schedule [name] to return to the cancer center within the next 2 weeks for further discussion regarding her living arrangement and possible hospice care.  She would likely need to discontinue Faslodex  if she enters hospice care. Recommendations: Continue management of the renal failure, pneumonia, and A-fib/CHF per the medical service Plan to continue monthly Faslodex  as an outpatient, outpatient follow-up will be scheduled at the cancer center Please call oncology as needed     LOS: 7 days   Arley Hof, MD   04/29/2024, 2:13 PM

## 2024-04-30 ENCOUNTER — Other Ambulatory Visit (HOSPITAL_COMMUNITY): Payer: Self-pay

## 2024-04-30 ENCOUNTER — Encounter: Payer: Self-pay | Admitting: Oncology

## 2024-04-30 DIAGNOSIS — I4891 Unspecified atrial fibrillation: Secondary | ICD-10-CM | POA: Diagnosis not present

## 2024-04-30 DIAGNOSIS — Z170 Estrogen receptor positive status [ER+]: Secondary | ICD-10-CM

## 2024-04-30 DIAGNOSIS — C50919 Malignant neoplasm of unspecified site of unspecified female breast: Secondary | ICD-10-CM

## 2024-04-30 DIAGNOSIS — N179 Acute kidney failure, unspecified: Secondary | ICD-10-CM | POA: Diagnosis not present

## 2024-04-30 DIAGNOSIS — E875 Hyperkalemia: Secondary | ICD-10-CM | POA: Diagnosis not present

## 2024-04-30 LAB — BASIC METABOLIC PANEL WITH GFR
Anion gap: 11 (ref 5–15)
BUN: 32 mg/dL — ABNORMAL HIGH (ref 8–23)
CO2: 24 mmol/L (ref 22–32)
Calcium: 8.5 mg/dL — ABNORMAL LOW (ref 8.9–10.3)
Chloride: 103 mmol/L (ref 98–111)
Creatinine, Ser: 2 mg/dL — ABNORMAL HIGH (ref 0.44–1.00)
GFR, Estimated: 24 mL/min — ABNORMAL LOW (ref >60)
Glucose, Bld: 150 mg/dL — ABNORMAL HIGH (ref 70–99)
Potassium: 3.5 mmol/L (ref 3.5–5.1)
Sodium: 138 mmol/L (ref 135–145)

## 2024-04-30 MED ORDER — TORSEMIDE 20 MG PO TABS
20.0000 mg | ORAL_TABLET | Freq: Every day | ORAL | 1 refills | Status: DC
  Filled 2024-04-30: qty 30, 30d supply, fill #0

## 2024-04-30 MED ORDER — ROSUVASTATIN CALCIUM 10 MG PO TABS
5.0000 mg | ORAL_TABLET | Freq: Every day | ORAL | 0 refills | Status: DC
  Filled 2024-04-30: qty 30, 60d supply, fill #0

## 2024-04-30 MED ORDER — METOPROLOL TARTRATE 100 MG PO TABS
100.0000 mg | ORAL_TABLET | Freq: Two times a day (BID) | ORAL | 3 refills | Status: DC
  Filled 2024-04-30: qty 60, 30d supply, fill #0

## 2024-04-30 MED ORDER — APIXABAN 2.5 MG PO TABS
2.5000 mg | ORAL_TABLET | Freq: Two times a day (BID) | ORAL | 1 refills | Status: DC
  Filled 2024-04-30: qty 60, 30d supply, fill #0

## 2024-04-30 MED ORDER — HYDRALAZINE HCL 25 MG PO TABS
25.0000 mg | ORAL_TABLET | Freq: Three times a day (TID) | ORAL | 1 refills | Status: DC
  Filled 2024-04-30: qty 90, 30d supply, fill #0

## 2024-04-30 NOTE — Plan of Care (Signed)
  Problem: Clinical Measurements: Goal: Ability to maintain clinical measurements within normal limits will improve Outcome: Progressing Goal: Will remain free from infection Outcome: Progressing Goal: Diagnostic test results will improve Outcome: Progressing Goal: Respiratory complications will improve Outcome: Progressing Goal: Cardiovascular complication will be avoided Outcome: Progressing   Problem: Activity: Goal: Risk for activity intolerance will decrease Outcome: Progressing   Problem: Nutrition: Goal: Adequate nutrition will be maintained Outcome: Progressing   Problem: Coping: Goal: Level of anxiety will decrease Outcome: Progressing   Problem: Elimination: Goal: Will not experience complications related to bowel motility Outcome: Progressing Goal: Will not experience complications related to urinary retention Outcome: Progressing   Problem: Pain Managment: Goal: General experience of comfort will improve and/or be controlled Outcome: Progressing   Problem: Safety: Goal: Ability to remain free from injury will improve Outcome: Progressing   Problem: Skin Integrity: Goal: Risk for impaired skin integrity will decrease Outcome: Progressing   Problem: Education: Goal: Ability to describe self-care measures that may prevent or decrease complications (Diabetes Survival Skills Education) will improve Outcome: Progressing Goal: Individualized Educational Video(s) Outcome: Progressing   Problem: Coping: Goal: Ability to adjust to condition or change in health will improve Outcome: Progressing   Problem: Fluid Volume: Goal: Ability to maintain a balanced intake and output will improve Outcome: Progressing   Problem: Health Behavior/Discharge Planning: Goal: Ability to identify and utilize available resources and services will improve Outcome: Progressing Goal: Ability to manage health-related needs will improve Outcome: Progressing   Problem:  Metabolic: Goal: Ability to maintain appropriate glucose levels will improve Outcome: Progressing   Problem: Nutritional: Goal: Maintenance of adequate nutrition will improve Outcome: Progressing Goal: Progress toward achieving an optimal weight will improve Outcome: Progressing   Problem: Skin Integrity: Goal: Risk for impaired skin integrity will decrease Outcome: Progressing   Problem: Tissue Perfusion: Goal: Adequacy of tissue perfusion will improve Outcome: Progressing   Problem: Education: Goal: Knowledge of disease or condition will improve Outcome: Progressing Goal: Knowledge of secondary prevention will improve (MUST DOCUMENT ALL) Outcome: Progressing Goal: Knowledge of patient specific risk factors will improve (DELETE if not current risk factor) Outcome: Progressing   Problem: Ischemic Stroke/TIA Tissue Perfusion: Goal: Complications of ischemic stroke/TIA will be minimized Outcome: Progressing   Problem: Coping: Goal: Will verbalize positive feelings about self Outcome: Progressing Goal: Will identify appropriate support needs Outcome: Progressing   Problem: Health Behavior/Discharge Planning: Goal: Ability to manage health-related needs will improve Outcome: Progressing Goal: Goals will be collaboratively established with patient/family Outcome: Progressing   Problem: Self-Care: Goal: Ability to participate in self-care as condition permits will improve Outcome: Progressing Goal: Verbalization of feelings and concerns over difficulty with self-care will improve Outcome: Progressing Goal: Ability to communicate needs accurately will improve Outcome: Progressing   Problem: Nutrition: Goal: Risk of aspiration will decrease Outcome: Progressing Goal: Dietary intake will improve Outcome: Progressing

## 2024-04-30 NOTE — TOC Transition Note (Addendum)
 Transition of Care [facility]) - Discharge Note   Patient Details  Name: [name] MRN: [id number] Date of Birth: 1934-09-15  [facility] CM/SW Contact:  Corean JAYSON Canary, RN Phone Number: 04/30/2024, 8:24 AM   Clinical Narrative:    Patient to be discharged, [facility] for Home health orders in. [place] from [facility] notified of probable DC today  No further needs identified   Final next level of care: Home w Home Health Services Barriers to Discharge: No Barriers Identified   Patient Goals and CMS Choice            Discharge Placement                       Discharge Plan and Services Additional resources added to the After Visit Summary for                                       Social Drivers of Health (SDOH) Interventions SDOH Screenings   Food Insecurity: Patient Declined (04/26/2024)  Housing: Unknown (04/26/2024)  Transportation Needs: Patient Declined (04/26/2024)  Utilities: Patient Declined (04/26/2024)  Depression (PHQ2-9): Low Risk  (04/22/2024)  Social Connections: Patient Declined (04/26/2024)  Recent Concern: Social Connections - Moderately Isolated (04/09/2024)  Tobacco Use: Low Risk  (04/22/2024)     Readmission Risk Interventions    04/12/2024   11:59 AM 12/09/2023   12:21 PM 11/23/2023   11:56 AM  Readmission Risk Prevention Plan  Transportation Screening Complete Complete Complete  PCP or Specialist Appt within 5-7 Days   Complete  Home Care Screening   Complete  Medication Review (RN CM)   Complete  Medication Review (RN Care Manager) Referral to Pharmacy    HRI or Home Care Consult Complete    SW Recovery Care/Counseling Consult Complete    Palliative Care Screening Not Applicable    Skilled Nursing Facility Not Applicable Complete

## 2024-04-30 NOTE — Progress Notes (Signed)
 Trialysis catheter removed with no difficulty. Pressure dressing applied. Instructed to leave dressing in place for 48 hours. Return precautions reviewed. Instructed to remain in bed for 30 minutes, post removal. All questions answered.   Thorsten Climer R Inez Rosato, RN

## 2024-04-30 NOTE — Discharge Summary (Addendum)
 PATIENT DETAILS Name: [name] Age: 89 y.o. Sex: female Date of Birth: Nov 11, 1933 MRN: [id number]. Admitting Physician: Donnice JONELLE Beals, MD ERE:Cjmjijmjgjw, [name], MD  Admit Date: 04/22/2024 Discharge date: 04/30/2024  Recommendations for Outpatient Follow-up:  Follow up with PCP in 1-2 weeks Please obtain CMP/CBC in one week Repeat TSH in 4 to 6 weeks Repeat two-view chest x-ray in 4 to 6 weeks.  Admitted From:  Home  Disposition: Home health   Discharge Condition: good  CODE STATUS:   Code Status: Limited: Do not attempt resuscitation (DNR) -DNR-LIMITED -Do Not Intubate/DNI    Diet recommendation:  Diet Order             Diet - low sodium heart healthy           Diet regular Room service appropriate? Yes; Fluid consistency: Thin  Diet effective now                    Brief Summary: Patient is a 89 y.o.  female with history of metastatic breast cancer, HTN, CKD stage IIIb, chronic HFrEF-who was sent from nephrology clinic for evaluation of hyperkalemia and AKI.   Significant events: 7/17>> admit to ICU-severe hyperkalemia-underwent emergent HD. 7/18>> RUE weakness-code stroke-MRI brain positive for acute CVA 7/19>> transferred to [facility]   Significant studies: 7/17>> CT renal stone study: No hydronephrosis, B/L lower lobe consolidation/effusion. 7/17>> CT chest: Progressive B/L lower lobe infiltrate with associated effusions. 7/18>> MRI brain: Acute infarct posterior left frontal white matter. 7/18>> TTE: EF 65-70% 7/18>> A1c: 6.1 7/19>> MRA head: No LVO, intracranial atherosclerosis-mild stenosis of left MCA M1, right PCA P1 segments. 7/19>> LDL: 20. 7/20>> carotid Doppler: No significant stenosis.   Significant microbiology data: None   Procedures: None   Consults: PCCM Neurology Neurology  Brief Hospital Course: Hyperkalemia Likely secondary to worsening renal function. Required emergent dialysis on initial  presentation Now resolved   AKI on CKD stage IIIb Unclear etiology of AKI but suspicion for hemodynamic mediated kidney injury Renal function had improved but slight uptick over the past several days after a dose of Lasix  She is euvolemic-renal function has now plateaued and started to downtrend Will hold off on diuretics today-and resume diuretics starting tomorrow-see below.   Acute on chronic HFpEF Volume status stable See above-holding diuretics today but starting tomorrow-after discussion with nephrology several days back-will resume diuretics but switch to Demadex 20 mg daily.   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 After risk/benefit discussion with patient/family-and further discussion with primary cardiologist (Dr. Jeffrie) and stroke MD-has been started on Eliquis . Continue statin. Stroke team followed closely   Persistent A-fib with RVR Rate controlled this morning Continue metoprolol /Cardizem  Started on Eliquis  this admit.  See above.   Elevated TSH FT4 normal-likely subclinical hypothyroidism Will hold off on starting levothyroxine (A-fib RVR a few days ago) repeat TSH in 4 to 6 weeks.   B/L PNA Since he was more congested/coughing-started on antibiotics on 7/20-reviewed prior imaging-has clear-cut infiltrates Completed course of Unasyn  and Zithromax  while inpatient.   B/L pleural effusions Probably transudative in etiology in the setting of CHF/worsening renal function Currently these effusions do not appear to be symptomatic. Treated with diuretics as needed during this hospitalization Repeat two-view chest x-ray in 4 to 6 weeks.   Normocytic anemia Likely due to combination of CKD/malignancy Follow Hb   Hypokalemia Likely due to furosemide  Repleted/normalized.   Hypomagnesemia Repeat needed.  Transaminitis Mild Unclear etiology LFTs have essentially normalized.   HTN BP better  controlled Continue Cardizem /Imdur /metoprolol /hydralazine .  Follow/optimize.   History of breast cancer with metastatic disease Prior history of ovarian cancer Continue outpatient follow-up with oncology.   Discharge Diagnoses:  Principal Problem:   Acute renal failure (HCC) Active Problems:   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, estrogen receptor positive [facility])   Discharge Instructions:  Activity:  As tolerated with Full fall precautions use walker/cane & assistance as needed  Discharge Instructions     Ambulatory referral to Neurology   Complete by: As directed    Follow up with stroke clinic NP at [facility] in about 4-6 weeks. Thanks.   Call MD for:  difficulty breathing, headache or visual disturbances   Complete by: As directed    Call MD for:  extreme fatigue   Complete by: As directed    Call MD for:  persistant dizziness or light-headedness   Complete by: As directed    Diet - low sodium heart healthy   Complete by: As directed    Discharge instructions   Complete by: As directed    Follow with Primary MD  Elliot Charm, MD in 1-2 weeks  Please get a complete blood count and chemistry panel checked by your Primary MD at your next visit, and again as instructed by your Primary MD.  Get Medicines reviewed and adjusted: Please take all your medications with you for your next visit with your Primary MD  Laboratory/radiological data: Please request your Primary MD to go over all hospital tests and procedure/radiological results at the follow up, please ask your Primary MD to get all Hospital records sent to his/her office.  In some cases, they will be blood work, cultures and biopsy results pending at the time of your discharge. Please request that your primary care M.D. follows up on these results.  Also Note the following: If you experience worsening of your admission symptoms, develop shortness of breath, life threatening emergency, suicidal or homicidal  thoughts you must seek medical attention immediately by calling 911 or calling your MD immediately  if symptoms less severe.  You must read complete instructions/literature along with all the possible adverse reactions/side effects for all the Medicines you take and that have been prescribed to you. Take any new Medicines after you have completely understood and accpet all the possible adverse reactions/side effects.   Do not drive when taking Pain medications or sleeping medications (Benzodaizepines)  Do not take more than prescribed Pain, Sleep and Anxiety Medications. It is not advisable to combine anxiety,sleep and pain medications without talking with your primary care practitioner  Special Instructions: If you have smoked or chewed Tobacco  in the last 2 yrs please stop smoking, stop any regular Alcohol  and or any Recreational drug use.  Wear Seat belts while driving.  Please note: You were cared for by a hospitalist during your hospital stay. Once you are discharged, your primary care physician will handle any further medical issues. Please note that NO REFILLS for any discharge medications will be authorized once you are discharged, as it is imperative that you return to your primary care physician (or establish a relationship with a primary care physician if you do not have one) for your post hospital discharge needs so that they can reassess your need for medications and monitor your lab values.   Increase activity slowly   Complete by: As directed    No wound care   Complete by:  As directed       Allergies as of 04/30/2024       Reactions   Ramipril Itching        Medication List     STOP taking these medications    aspirin  EC 81 MG tablet   furosemide  40 MG tablet Commonly known as: LASIX        TAKE these medications    apixaban  2.5 MG Tabs tablet Commonly known as: ELIQUIS  Take 1 tablet (2.5 mg total) by mouth 2 (two) times daily.   Belsomra 10 MG  Tabs Generic drug: Suvorexant Take 10 mg by mouth at bedtime.   CALTRATE 600+D PO Take 1 tablet by mouth 2 (two) times daily.   cyanocobalamin  1000 MCG tablet Commonly known as: VITAMIN B12 Take 1,000 mcg by mouth daily.   diltiazem  360 MG 24 hr capsule Commonly known as: TIAZAC  Take 360 mg by mouth daily.   hydrALAZINE  25 MG tablet Commonly known as: APRESOLINE  Take 1 tablet (25 mg total) by mouth every 8 (eight) hours.   hydroxychloroquine  200 MG tablet Commonly known as: PLAQUENIL  Take 200 mg by mouth daily.   isosorbide  mononitrate 60 MG 24 hr tablet Commonly known as: IMDUR  Take 1 tablet (60 mg total) by mouth daily.   magnesium  oxide 400 MG tablet Commonly known as: MAG-OX Take 2 tablets (800 mg total) by mouth 2 (two) times daily.   melatonin 3 MG Tabs tablet Take 1 tablet (3 mg total) by mouth at bedtime as needed. What changed: reasons to take this   metoprolol  tartrate 100 MG tablet Commonly known as: LOPRESSOR  Take 1 tablet (100 mg total) by mouth 2 (two) times daily. What changed:  medication strength how much to take   Multivitamin Women 50+ Tabs Take 1 tablet by mouth daily with breakfast.   DECUBI-VITE PO Take 1 capsule by mouth daily at 6 (six) AM. For wound healing   oxyCODONE  5 MG immediate release tablet Commonly known as: Oxy IR/ROXICODONE  Take 1 tablet (5 mg total) by mouth every 6 (six) hours as needed.   pantoprazole  20 MG tablet Commonly known as: PROTONIX  Take 1 tablet (20 mg total) by mouth daily.   Potassium Chloride  ER 20 MEQ Tbcr Take 1 tablet by mouth 2 (two) times daily.   rosuvastatin  10 MG tablet Commonly known as: CRESTOR  Take 0.5 tablets (5 mg total) by mouth at bedtime. What changed: how much to take   sodium bicarbonate  650 MG tablet Take 1 tablet (650 mg total) by mouth 2 (two) times daily.   tiZANidine  4 MG tablet Commonly known as: ZANAFLEX  Take 4 mg by mouth 2 (two) times daily as needed for muscle spasms.    torsemide 20 MG tablet Commonly known as: DEMADEX Take 1 tablet (20 mg total) by mouth daily. Start taking on: May 01, 2024   triamcinolone  cream 0.1 % Commonly known as: KENALOG Apply 1 application  topically as needed (for rashes- affected areas).   Triple Antibiotic Oint Apply topically daily. To skin tears prn   Vitamin D3 50 MCG (2000 UT) Tabs Take 2,000 Units by mouth in the morning and at bedtime.        Follow-up Information     [facility]. Schedule an appointment as soon as possible for a visit in 1 month(s).   Specialty: Neurology Why: stroke clinic Contact information: [address] [place]  [address] [phone]        Elliot Charm, MD. Schedule  an appointment as soon as possible for a visit in 1 week(s).   Specialty: Internal Medicine Contact information: [address] [place] [address] [phone]                Allergies  Allergen Reactions   Ramipril Itching     Other Procedures/Studies: DG Chest 2 View Result Date: 04/27/2024 CLINICAL DATA:  Shortness of breath. EXAM: CHEST - 2 VIEW COMPARISON:  April 24, 2024. FINDINGS: The heart size and mediastinal contours are within normal limits. Mild central pulmonary vascular congestion is again noted with minimal bibasilar subsegmental atelectasis or edema and small pleural effusions. Left internal jugular dialysis catheter is unchanged. Old lower thoracic compression fracture is noted. IMPRESSION: Mild central pulmonary vascular congestion with minimal bibasilar subsegmental atelectasis or edema and small pleural effusions. Aortic Atherosclerosis (ICD10-I70.0). Electronically Signed   By: Lynwood Landy Raddle M.D.   On: 04/27/2024 13:40   VAS US  CAROTID (at [facility] and [facility] only) Result Date: 04/26/2024 Carotid Arterial Duplex Study Patient Name:  JANNESSA OGDEN  Date of Exam:   04/25/2024 Medical Rec #: [id number]          Accession #:    [id number] Date of Birth: [birth year]        Patient Gender: F Patient Age:   23 years Exam Location:  [facility] Procedure:      VAS US  CAROTID Referring Phys: EARLE DE LA TORRE --------------------------------------------------------------------------------  Indications:       Carotid artery disease and acute onset right arm and leg                    weakness. Risk Factors:      Hypertension, hyperlipidemia, coronary artery disease. Other Factors:     CKD stage IIIb, metastatic breast cancer, ovarian cancer,                    A-fib. Limitations        Today's exam was limited due to Line and bandages in left                    neck. Comparison Study:  Prior carotid duplex done 03/14/2022 indicating bilateral 1-39%                    ICA stenosis. Performing Technologist: Eleanor Lincoln  Examination Guidelines: A complete evaluation includes B-mode imaging, spectral Doppler, color Doppler, and power Doppler as needed of all accessible portions of each vessel. Bilateral testing is considered an integral part of a complete examination. Limited examinations for reoccurring indications may be performed as noted.  Right Carotid Findings: +----------+--------+--------+--------+------------------+------------------+           PSV cm/sEDV cm/sStenosisPlaque DescriptionComments           +----------+--------+--------+--------+------------------+------------------+ CCA Prox  140     17                                intimal thickening +----------+--------+--------+--------+------------------+------------------+ CCA Distal122     11              calcific                             +----------+--------+--------+--------+------------------+------------------+ ICA Prox  87      16      1-39%   calcific                             +----------+--------+--------+--------+------------------+------------------+  ICA Mid   82      16                                                    +----------+--------+--------+--------+------------------+------------------+ ICA Distal97      20                                                   +----------+--------+--------+--------+------------------+------------------+ ECA       84      8                                                    +----------+--------+--------+--------+------------------+------------------+ +----------+--------+-------+--------+-------------------+           PSV cm/sEDV cmsDescribeArm Pressure (mmHG) +----------+--------+-------+--------+-------------------+ [id number]                                        +----------+--------+-------+--------+-------------------+ +---------+--------+--+--------+--+ VertebralPSV cm/s80EDV cm/s14 +---------+--------+--+--------+--+  Left Carotid Findings: +----------+--------+--------+--------+------------------+---------------------+           PSV cm/sEDV cm/sStenosisPlaque DescriptionComments              +----------+--------+--------+--------+------------------+---------------------+ CCA Prox                                            Proximal CCA is not                                                       visualized.           +----------+--------+--------+--------+------------------+---------------------+ CCA Mid                                             Not visualized        +----------+--------+--------+--------+------------------+---------------------+ CCA Distal277     36              calcific          Shadowing             +----------+--------+--------+--------+------------------+---------------------+ ICA Prox  111     19      1-39%   calcific                                +----------+--------+--------+--------+------------------+---------------------+ ICA Mid   100     24                                                       +----------+--------+--------+--------+------------------+---------------------+  ICA Distal71      21                                                      +----------+--------+--------+--------+------------------+---------------------+ ECA       72      0                                                       +----------+--------+--------+--------+------------------+---------------------+ +----------+--------+--------+--------+-------------------+           PSV cm/sEDV cm/sDescribeArm Pressure (mmHG) +----------+--------+--------+--------+-------------------+ [id number]                                         +----------+--------+--------+--------+-------------------+   Summary: Right Carotid: Velocities in the right ICA are consistent with a 1-39% stenosis. Left Carotid: Velocities in the left ICA are consistent with a 1-39% stenosis. Vertebrals:  Right vertebral artery demonstrates antegrade flow. Unable to              visualize left vertebral secondary to line/bandage. Subclavians: Normal flow hemodynamics were seen in bilateral subclavian              arteries. *See table(s) above for measurements and observations.  Electronically signed by Eather Popp MD on 04/26/2024 at 5:21:47 PM.    Final    DG Chest Port 1V same Day Result Date: 04/24/2024 CLINICAL DATA:  [id number] SOB (shortness of breath) [id number] EXAM: PORTABLE CHEST - 1 VIEW SAME DAY COMPARISON:  CT 04/22/2024 and previous FINDINGS: Left IJ HD catheter to the distal SVC. No pneumothorax. Moderate pulmonary vascular congestion. Pleural effusions right greater than left with some atelectasis/consolidation at the lung bases. Heart size and mediastinal contours are within normal limits. Aortic Atherosclerosis (ICD10-170.0). Regional bones unremarkable. IMPRESSION: Pulmonary vascular congestion with bilateral pleural effusions and basilar atelectasis/consolidation. Electronically Signed   By: JONETTA Faes M.D.   On: 04/24/2024 13:18    MR ANGIO HEAD WO CONTRAST Result Date: 04/24/2024 CLINICAL DATA:  89 year old female code stroke presentation, neurologic deficit. Small area of patchy left centrum semiovale ischemia on brain MRI last night. EXAM: MRA HEAD WITHOUT CONTRAST TECHNIQUE: Angiographic images of the Circle of Willis were acquired using MRA technique without intravenous contrast. COMPARISON:  Brain MRI 04/23/2024. FINDINGS: Anterior circulation: Antegrade flow in both ICA siphons. Left-side persistent trigeminal artery. No ICA siphon stenosis. Patent carotid termini. Dominant left and diminutive or absent right ACA A1 segment. Normal anterior communicating artery. Visible bilateral ACA branches are within normal limits. MCA M1 segments are tortuous, more so the left. There is mild to moderate left MCA M1 segment irregularity and stenosis on series 1020 image 12. MCA bifurcations are patent and bilateral MCA branches are within normal limits. Posterior circulation: Antegrade flow in the posterior circulation with dominant appearing distal right vertebral artery. Diminutive distal left vertebral artery appears to terminates in PICA. Right PICA appears patent. Patent basilar artery without stenosis. And furthermore there is a left-sided persistent trigeminal artery (series 5, image 108), also contributing to the distal basilar. Normal SCA and left PCA origins. Fetal type right PCA  origin. Left posterior communicating artery diminutive or absent. Left PCA branches are within normal limits. There is mild to moderate irregularity and stenosis of the right PCA P2 segment broadly (series 1032, image 9). Distal right PCA branches remain patent. Anatomic variants: Persistent left side trigeminal artery. Dominant right vertebral, left vertebral terminates in PICA. Fetal right PCA origin. Dominant left and diminutive or absent right ACA A1 segments. Other: No intracranial mass effect or ventriculomegaly. IMPRESSION: 1. Negative for large vessel  occlusion. 2. Intracranial atherosclerosis, but mild for age overall. Mild to moderate irregularity and stenosis of the left MCA M1 and right PCA P2 segments. 3. Multiple vascular anatomic variations, including persistent Left side Trigeminal Artery. Electronically Signed   By: VEAR Hurst M.D.   On: 04/24/2024 07:07   MR BRAIN WO CONTRAST Result Date: 04/23/2024 EXAM: MRI BRAIN WITHOUT CONTRAST 04/23/2024 09:10:11 PM TECHNIQUE: Multiplanar multisequence MRI of the head/brain was performed without the administration of intravenous contrast. COMPARISON: None available. CLINICAL HISTORY: Stroke, follow up. FINDINGS: BRAIN AND VENTRICLES: Small area of early subacute or acute ischemia within the posterior left frontal white matter. Bilateral early confluent hyperintense T2-weighted signal within the cerebral white matter. No intracranial hemorrhage. No mass. No midline shift. No hydrocephalus. The sella is unremarkable. Normal flow voids. ORBITS: No acute abnormality. SINUSES AND MASTOIDS: No acute abnormality. BONES AND SOFT TISSUES: Normal marrow signal. No acute soft tissue abnormality. IMPRESSION: 1. Small area of early subacute/acute ischemia within the posterior left frontal white matter. 2. Bilateral early confluent hyperintense T2-weighted signal within the cerebral white matter, most commonly due to chronic small vessel disease. Electronically signed by: Franky Stanford MD 04/23/2024 09:57 PM EDT RP Workstation: [id number]   ECHOCARDIOGRAM COMPLETE Result Date: 04/23/2024    ECHOCARDIOGRAM REPORT   Patient Name:   [name] Date of Exam: 04/23/2024 Medical Rec #:  [id number]        Height:       66.0 in Accession #:    [id number]       Weight:       112.9 lb Date of Birth:  1934-05-19       BSA:          1.568 m Patient Age:    89 years         BP:           172/65 mmHg Patient Gender: F                HR:           84 bpm. Exam Location:  Inpatient Procedure: 2D Echo, Cardiac Doppler and Color Doppler  (Both Spectral and Color            Flow Doppler were utilized during procedure). Indications:    Acute Renal Failure  History:        Patient has prior history of Echocardiogram examinations. Renal                 Failure.  Sonographer:    Vella Key Referring Phys: [phone] MATTHEW R HUNSUCKER IMPRESSIONS  1. Left ventricular ejection fraction, by estimation, is 65 to 70%. The left ventricle has normal function. The left ventricle has no regional wall motion abnormalities. There is mild left ventricular hypertrophy. Left ventricular diastolic parameters were normal.  2. Right ventricular systolic function is normal. The right ventricular size is mildly enlarged.  3. Left atrial size was moderately dilated.  4. Mild mitral valve regurgitation.  5. The aortic  valve is tricuspid. Aortic valve regurgitation is not visualized. Aortic valve sclerosis/calcification is present, without any evidence of aortic stenosis. FINDINGS  Left Ventricle: Left ventricular ejection fraction, by estimation, is 65 to 70%. The left ventricle has normal function. The left ventricle has no regional wall motion abnormalities. The left ventricular internal cavity size was small. There is mild left ventricular hypertrophy. Left ventricular diastolic parameters were normal. Right Ventricle: The right ventricular size is mildly enlarged. Right vetricular wall thickness was not assessed. Right ventricular systolic function is normal. Left Atrium: Left atrial size was moderately dilated. Right Atrium: Right atrial size was normal in size. Pericardium: There is no evidence of pericardial effusion. Mitral Valve: There is mild thickening of the mitral valve leaflet(s). Mild to moderate mitral annular calcification. Mild mitral valve regurgitation. Tricuspid Valve: The tricuspid valve is normal in structure. Tricuspid valve regurgitation is trivial. Aortic Valve: The aortic valve is tricuspid. Aortic valve regurgitation is not visualized. Aortic valve  sclerosis/calcification is present, without any evidence of aortic stenosis. Pulmonic Valve: The pulmonic valve was normal in structure. Pulmonic valve regurgitation is trivial. Aorta: The aortic root and ascending aorta are structurally normal, with no evidence of dilitation. IAS/Shunts: No atrial level shunt detected by color flow Doppler.  LEFT VENTRICLE PLAX 2D LVIDd:         3.10 cm     Diastology LVIDs:         1.80 cm     LV e' medial:    12.80 cm/s LV PW:         1.30 cm     LV E/e' medial:  15.1 LV IVS:        1.10 cm     LV e' lateral:   13.50 cm/s LVOT diam:     1.70 cm     LV E/e' lateral: 14.3 LV SV:         70 LV SV Index:   45 LVOT Area:     2.27 cm  LV Volumes (MOD) LV vol d, MOD A2C: 58.4 ml LV vol d, MOD A4C: 83.9 ml LV vol s, MOD A2C: 15.4 ml LV vol s, MOD A4C: 32.8 ml LV SV MOD A2C:     43.0 ml LV SV MOD A4C:     83.9 ml LV SV MOD BP:      46.6 ml RIGHT VENTRICLE RV Basal diam:  3.90 cm RV S prime:     14.30 cm/s TAPSE (M-mode): 1.5 cm LEFT ATRIUM             Index        RIGHT ATRIUM           Index LA diam:        4.80 cm 3.06 cm/m   RA Area:     12.00 cm LA Vol (A2C):   68.5 ml 43.68 ml/m  RA Volume:   28.80 ml  18.36 ml/m LA Vol (A4C):   63.3 ml 40.36 ml/m LA Biplane Vol: 66.4 ml 42.34 ml/m  AORTIC VALVE LVOT Vmax:   135.00 cm/s LVOT Vmean:  105.000 cm/s LVOT VTI:    0.309 m  AORTA Ao Root diam: 2.80 cm Ao Asc diam:  2.80 cm MITRAL VALVE MV Area (PHT): 5.42 cm     SHUNTS MV Decel Time: 140 msec     Systemic VTI:  0.31 m MV E velocity: 193.00 cm/s  Systemic Diam: 1.70 cm MV A velocity: 64.50 cm/s MV E/A ratio:  2.99  Vina Gull MD Electronically signed by Vina Gull MD Signature Date/Time: 04/23/2024/3:01:59 PM    Final    CT HEAD CODE STROKE WO CONTRAST Result Date: 04/23/2024 CLINICAL DATA:  Code stroke. Neuro deficit, acute, stroke suspected. Right arm weakness. EXAM: CT HEAD WITHOUT CONTRAST TECHNIQUE: Contiguous axial images were obtained from the base of the skull through the  vertex without intravenous contrast. RADIATION DOSE REDUCTION: This exam was performed according to the departmental dose-optimization program which includes automated exposure control, adjustment of the mA and/or kV according to patient size and/or use of iterative reconstruction technique. COMPARISON:  Head CT 04/21/2008. FINDINGS: Brain: Generalized cerebral atrophy. Patchy and ill-defined hypoattenuation within the cerebral white matter, nonspecific but compatible with moderate to advanced chronic small vessel ischemic disease. There is no acute intracranial hemorrhage. No demarcated cortical infarct. No extra-axial fluid collection. No evidence of an intracranial mass. No midline shift. Vascular: No hyperdense vessel.  Atherosclerotic calcifications. Skull: No calvarial fracture or aggressive osseous lesion. Sinuses/Orbits: No mass or acute finding within the imaged orbits. No significant paranasal sinus disease. ASPECTS [facility] Stroke Program Early CT Score) - Ganglionic level infarction (caudate, lentiform nuclei, internal capsule, insula, M1-M3 cortex): 7 - Supraganglionic infarction (M4-M6 cortex): 3 Total score (0-10 with 10 being normal): 10 No evidence of an acute intracranial abnormality. These results were communicated to Dr. Matthews At 1:39 pmon 7/18/2025by text page via the [facility] messaging system. IMPRESSION: 1. No evidence of an acute intracranial abnormality. 2. Parenchymal atrophy and chronic small vessel ischemic disease, progressed from the prior head CT of 04/21/2008. Electronically Signed   By: Rockey Childs D.O.   On: 04/23/2024 13:39   CT CHEST WO CONTRAST Result Date: 04/22/2024 CLINICAL DATA:  History of lung carcinoma with metastatic disease, shortness of breath EXAM: CT CHEST WITHOUT CONTRAST TECHNIQUE: Multidetector CT imaging of the chest was performed following the standard protocol without IV contrast. RADIATION DOSE REDUCTION: This exam was performed according to the departmental  dose-optimization program which includes automated exposure control, adjustment of the mA and/or kV according to patient size and/or use of iterative reconstruction technique. COMPARISON:  04/09/2024 FINDINGS: Cardiovascular: Heart is stable in size. Heavy coronary calcifications are noted. Atherosclerotic calcifications of the aorta are noted without aneurysmal dilatation. Mediastinum/Nodes: Thoracic inlet shows evidence of a left jugular dialysis catheter the esophagus as visualized is within normal limits. No hilar or mediastinal adenopathy is noted. Lungs/Pleura: Lungs again demonstrate bilateral lower lobe infiltrates with associated effusions right greater than left. These changes have progressed in the interval from the prior exam. Scattered small nodules are again seen to include a 3 mm nodule in the left lower lobe on image number 81 of series 4. New patchy somewhat nodular density is noted in the left lower lobe on image number 58 of series 4 measuring up to 10 mm in average diameter. Given its abrupt onset and adjacent progressive infiltrate, this is felt to be postinflammatory in nature. Previously seen right lower lobe nodule is now enveloped within the increasing basilar infiltrate. No other sizable nodules are seen. Upper Abdomen: As described on concomitant CT of the abdomen and pelvis. Musculoskeletal: Old healed rib fractures are seen. Heel metastatic changes are noted involving the vertebral bodies from T8-T12. T11 vertebral plana with increased kyphosis is seen. Previously seen nodule in the region of the left breast is again identified and stable. Again possibility of neoplastic lesion deserves consideration. IMPRESSION: Progressive bilateral lower lobe infiltrate with associated effusions. Stable parenchymal nodules when compared with  the prior exam. Healed metastatic disease Aortic Atherosclerosis (ICD10-I70.0) and Emphysema (ICD10-J43.9). Electronically Signed   By: Oneil Devonshire M.D.   On:  04/22/2024 19:29   CT RENAL STONE STUDY Result Date: 04/22/2024 CLINICAL DATA:  Flank pain EXAM: CT ABDOMEN AND PELVIS WITHOUT CONTRAST TECHNIQUE: Multidetector CT imaging of the abdomen and pelvis was performed following the standard protocol without IV contrast. RADIATION DOSE REDUCTION: This exam was performed according to the departmental dose-optimization program which includes automated exposure control, adjustment of the mA and/or kV according to patient size and/or use of iterative reconstruction technique. COMPARISON:  03/25/2024 FINDINGS: Lower chest: Bilateral lower lobe consolidation and effusions are seen. This is worse on the right than the left. Hepatobiliary: Known hepatic lesions are not well visualized on this noncontrast study. Status post cholecystectomy. No biliary dilatation. Pancreas: Unremarkable. No pancreatic ductal dilatation or surrounding inflammatory changes. Spleen: Normal in size without focal abnormality. Adrenals/Urinary Tract: Left adrenal gland is within normal limits. Right adrenal nodule is again noted and stable. No follow-up is recommended. Kidneys are well visualized bilaterally. Bilateral nonobstructing renal calculi are noted. The largest of these on the left measures 8 mm. Largest on the right measures approximately 3 mm. The ureters are within normal limits. The bladder is decompressed. Stomach/Bowel: Fecal material is noted scattered throughout the colon. No obstructive changes are seen. The appendix has been surgically removed. Small bowel and stomach are within normal limits. No obstructive changes are seen. Vascular/Lymphatic: Aortic atherosclerosis. No enlarged abdominal or pelvic lymph nodes. Reproductive: Status post hysterectomy. No adnexal masses. Other: No abdominal wall hernia or abnormality. No abdominopelvic ascites. Musculoskeletal: Vertebral plana is noted at T11. Increased sclerosis is noted in T10 and T12 consistent with prior metastatic disease.  Anterolisthesis of L5 on S1 is noted. Metastatic disease in the L4 spinous process is noted as well. IMPRESSION: Bilateral lower lobe consolidation and effusions right greater than left. Bilateral nonobstructing renal calculi as described. Scattered fecal material without obstructive change. Healed metastatic bony lesions Known hepatic metastatic disease is not well appreciated on this noncontrast study. Electronically Signed   By: Oneil Devonshire M.D.   On: 04/22/2024 19:12   ECHOCARDIOGRAM COMPLETE Result Date: 04/09/2024    ECHOCARDIOGRAM REPORT   Patient Name:   [name] Date of Exam: 04/09/2024 Medical Rec #:  [id number]        Height:       66.0 in Accession #:    [id number]       Weight:       109.9 lb Date of Birth:  11-22-1933       BSA:          1.551 m Patient Age:    89 years         BP:           154/66 mmHg Patient Gender: F                HR:           99 bpm. Exam Location:  Inpatient Procedure: 2D Echo, Cardiac Doppler and Color Doppler (Both Spectral and Color            Flow Doppler were utilized during procedure). Indications:    Atrial Fibrillation  History:        Patient has prior history of Echocardiogram examinations, most                 recent 12/24/2023. CHF, CAD, Arrythmias:Atrial Fibrillation; Risk  Factors:Hypertension.  Sonographer:    Philomena Daring Referring Phys: [id number] MARSA NOVAK MELVIN IMPRESSIONS  1. Left ventricular ejection fraction, by estimation, is 60 to 65%. The left ventricle has normal function. The left ventricle has no regional wall motion abnormalities. There is moderate asymmetric left ventricular hypertrophy of the basal and septal segments. Left ventricular diastolic function could not be evaluated.  2. Right ventricular systolic function is normal. The right ventricular size is normal. Tricuspid regurgitation signal is inadequate for assessing PA pressure.  3. Left atrial size was severely dilated.  4. Right atrial size was mild to moderately  dilated.  5. The mitral valve is abnormal. No evidence of mitral valve regurgitation. No evidence of mitral stenosis. Moderate to severe mitral annular calcification.  6. The aortic valve is tricuspid. Aortic valve regurgitation is mild. No aortic stenosis is present.  7. The inferior vena cava is dilated in size with >50% respiratory variability, suggesting right atrial pressure of 8 mmHg. Comparison(s): Changes from prior study are noted. AR is moderate on prior study. FINDINGS  Left Ventricle: Left ventricular ejection fraction, by estimation, is 60 to 65%. The left ventricle has normal function. The left ventricle has no regional wall motion abnormalities. Strain was performed and the global longitudinal strain is indeterminate. The left ventricular internal cavity size was normal in size. There is moderate asymmetric left ventricular hypertrophy of the basal and septal segments. Left ventricular diastolic function could not be evaluated due to atrial fibrillation. Left ventricular diastolic function could not be evaluated. Right Ventricle: The right ventricular size is normal. No increase in right ventricular wall thickness. Right ventricular systolic function is normal. Tricuspid regurgitation signal is inadequate for assessing PA pressure. Left Atrium: Left atrial size was severely dilated. Right Atrium: Right atrial size was mild to moderately dilated. Pericardium: There is no evidence of pericardial effusion. Mitral Valve: The mitral valve is abnormal. Moderate to severe mitral annular calcification. No evidence of mitral valve regurgitation. No evidence of mitral valve stenosis. Tricuspid Valve: The tricuspid valve is normal in structure. Tricuspid valve regurgitation is mild . No evidence of tricuspid stenosis. Aortic Valve: The aortic valve is tricuspid. Aortic valve regurgitation is mild. No aortic stenosis is present. Pulmonic Valve: The pulmonic valve was grossly normal. Pulmonic valve regurgitation  is not visualized. No evidence of pulmonic stenosis. Aorta: The aortic root and ascending aorta are structurally normal, with no evidence of dilitation. Venous: The inferior vena cava is dilated in size with greater than 50% respiratory variability, suggesting right atrial pressure of 8 mmHg. IAS/Shunts: The interatrial septum was not well visualized. Additional Comments: 3D was performed not requiring image post processing on an independent workstation and was indeterminate.  LEFT VENTRICLE PLAX 2D LVIDd:         3.80 cm   Diastology LVIDs:         2.50 cm   LV e' medial:    4.57 cm/s LV PW:         1.00 cm   LV E/e' medial:  26.8 LV IVS:        1.10 cm   LV e' lateral:   8.27 cm/s LVOT diam:     1.80 cm   LV E/e' lateral: 14.8 LV SV:         58 LV SV Index:   37 LVOT Area:     2.54 cm  RIGHT VENTRICLE            IVC RV S prime:  7.83 cm/s  IVC diam: 2.10 cm TAPSE (M-mode): 1.3 cm LEFT ATRIUM             Index        RIGHT ATRIUM           Index LA Vol (A2C):   79.0 ml 50.95 ml/m  RA Area:     17.50 cm LA Vol (A4C):   69.2 ml 44.63 ml/m  RA Volume:   48.70 ml  31.41 ml/m LA Biplane Vol: 80.0 ml 51.60 ml/m  AORTIC VALVE LVOT Vmax:   100.00 cm/s LVOT Vmean:  69.500 cm/s LVOT VTI:    0.227 m  AORTA Ao Root diam: 2.50 cm Ao Asc diam:  3.00 cm MITRAL VALVE MV Area (PHT): 4.21 cm     SHUNTS MV Decel Time: 180 msec     Systemic VTI:  0.23 m MV E velocity: 122.50 cm/s  Systemic Diam: 1.80 cm MV A velocity: 104.00 cm/s MV E/A ratio:  1.18 Vishnu Priya Mallipeddi Electronically signed by Diannah Late Mallipeddi Signature Date/Time: 04/09/2024/4:07:35 PM    Final    CT Angio Chest PE W and/or Wo Contrast Result Date: 04/09/2024 CLINICAL DATA:  MC-CTPulmonary embolism (PE) suspected, high prob history of lung cancer. Sternal chest pain LEFT shoulder pain. * Tracking Code: BO * EXAM: CT ANGIOGRAPHY CHEST WITH CONTRAST TECHNIQUE: Multidetector CT imaging of the chest was performed using the standard protocol during  bolus administration of intravenous contrast. Multiplanar CT image reconstructions and MIPs were obtained to evaluate the vascular anatomy. RADIATION DOSE REDUCTION: This exam was performed according to the departmental dose-optimization program which includes automated exposure control, adjustment of the mA and/or kV according to patient size and/or use of iterative reconstruction technique. CONTRAST:  60mL OMNIPAQUE  IOHEXOL  350 MG/ML SOLN COMPARISON:  CT chest 03/25/2024 FINDINGS: Cardiovascular: No filling defects within the pulmonary arteries to suggest acute pulmonary embolism. No significant vascular findings. Normal heart size. No pericardial effusion. Mediastinum/Nodes: No axillary or supraclavicular adenopathy. No mediastinal or hilar adenopathy. No pericardial fluid. Esophagus normal. Lungs/Pleura: Bilateral pleural effusions similar prior. Moderate effusion on the RIGHT and small on the LEFT. There is passive atelectasis of the RIGHT lower lobe. Small subcentimeter nodules in the lung base not changed from recent CT. Increased consolidation at the LEFT lung base (image 259/series 7 and 90/series 6. Upper Abdomen: Hepatic metastasis not well appreciated no acute findings Musculoskeletal: Sclerotic skeletal metastasis again noted. No change. Surgical clips in the lateral RIGHT breast Review of the MIP images confirms the above findings. IMPRESSION: 1. No evidence acute pulmonary embolism. 2. Bilateral pleural effusions with passive atelectasis of the RIGHT lower lobe. 3. Increased consolidation at the LEFT lung base. Concern for LEFT lower lobe pneumonia. 4. Stable small pulmonary nodules at the lung bases. 5. Stable skeletal metastasis. Electronically Signed   By: Jackquline Boxer M.D.   On: 04/09/2024 11:23   DG Chest Port 1 View Result Date: 04/09/2024 CLINICAL DATA:  Chest pain EXAM: PORTABLE CHEST 1 VIEW COMPARISON:  01/01/2024 FINDINGS: Low volume film. The cardio pericardial silhouette is enlarged.  Vascular congestion without overt airspace pulmonary edema. Bibasilar atelectasis/infiltrate is similar in the interval with small bilateral pleural effusions. No acute bony abnormality. Telemetry leads overlie the chest. IMPRESSION: 1. Low volume film with vascular congestion and small bilateral pleural effusions. 2. Bibasilar atelectasis/infiltrate is similar in the interval. Electronically Signed   By: Camellia Candle M.D.   On: 04/09/2024 08:57     TODAY-DAY OF DISCHARGE:  Subjective:   Tashona Calk today has no headache,no chest abdominal pain,no new weakness tingling or numbness, feels much better wants to go home today.   Objective:   Blood pressure (!) 156/66, pulse 85, temperature 97.6 F (36.4 C), temperature source Oral, resp. rate 20, weight 55.4 kg, last menstrual period 10/07/1974, SpO2 99%.  Intake/Output Summary (Last 24 hours) at 04/30/2024 0930 Last data filed at 04/30/2024 0545 Gross per 24 hour  Intake --  Output 750 ml  Net -750 ml   Filed Weights   04/27/24 0500 04/28/24 0418 04/29/24 0500  Weight: 53.8 kg 53.8 kg 55.4 kg    Exam: Awake Alert, Oriented *3, No new F.N deficits, Normal affect [place].AT,PERRAL Supple Neck,No JVD, No cervical lymphadenopathy appriciated.  Symmetrical Chest wall movement, Good air movement bilaterally, CTAB RRR,No Gallops,Rubs or new Murmurs, No Parasternal Heave +ve B.Sounds, Abd Soft, Non tender, No organomegaly appriciated, No rebound -guarding or rigidity. No Cyanosis, Clubbing or edema, No new Rash or bruise   PERTINENT RADIOLOGIC STUDIES: No results found.   PERTINENT LAB RESULTS: CBC: No results for input(s): WBC, HGB, HCT, PLT in the last 72 hours. CMET CMP     Component Value Date/Time   NA 138 04/30/2024 0417   NA 142 05/04/2021 1053   K 3.5 04/30/2024 0417   CL 103 04/30/2024 0417   CO2 24 04/30/2024 0417   GLUCOSE 150 (H) 04/30/2024 0417   BUN 32 (H) 04/30/2024 0417   BUN 35 (H) 05/04/2021 1053    CREATININE 2.00 (H) 04/30/2024 0417   CREATININE 3.04 (H) 04/22/2024 1131   CREATININE 1.44 (H) 08/01/2016 0924   CALCIUM  8.5 (L) 04/30/2024 0417   PROT 5.5 (L) 04/26/2024 0446   ALBUMIN  2.3 (L) 04/27/2024 0406   AST 26 04/26/2024 0446   AST 25 04/22/2024 1041   ALT 36 04/26/2024 0446   ALT 36 04/22/2024 1041   ALKPHOS 118 04/26/2024 0446   BILITOT 0.9 04/26/2024 0446   BILITOT 0.4 04/22/2024 1041   EGFR 39 (L) 05/04/2021 1053   GFRNONAA 24 (L) 04/30/2024 0417   GFRNONAA 14 (L) 04/22/2024 1131    GFR Estimated Creatinine Clearance: 17.3 mL/min (A) (by C-G formula based on SCr of 2 mg/dL (H)). No results for input(s): LIPASE, AMYLASE in the last 72 hours. No results for input(s): CKTOTAL, CKMB, CKMBINDEX, TROPONINI in the last 72 hours. Invalid input(s): POCBNP No results for input(s): DDIMER in the last 72 hours. No results for input(s): HGBA1C in the last 72 hours. No results for input(s): CHOL, HDL, LDLCALC, TRIG, CHOLHDL, LDLDIRECT in the last 72 hours. No results for input(s): TSH, T4TOTAL, T3FREE, THYROIDAB in the last 72 hours.  Invalid input(s): FREET3 No results for input(s): VITAMINB12, FOLATE, FERRITIN, TIBC, IRON, RETICCTPCT in the last 72 hours. Coags: No results for input(s): INR in the last 72 hours.  Invalid input(s): PT Microbiology: Recent Results (from the past 240 hours)  MRSA Next Gen by PCR, Nasal     Status: None   Collection Time: 04/22/24 11:06 PM   Specimen: Nasal Mucosa; Nasal Swab  Result Value Ref Range Status   MRSA by PCR Next Gen NOT DETECTED NOT DETECTED Final    Comment: (NOTE) The GeneXpert MRSA Assay (FDA approved for NASAL specimens only), is one component of a comprehensive MRSA colonization surveillance program. It is not intended to diagnose MRSA infection nor to guide or monitor treatment for MRSA infections. Test performance is not FDA approved in patients less than 2  years  old. Performed at [facility], [address] [place], [place] [address]     FURTHER DISCHARGE INSTRUCTIONS:  Get Medicines reviewed and adjusted: Please take all your medications with you for your next visit with your Primary MD  Laboratory/radiological data: Please request your Primary MD to go over all hospital tests and procedure/radiological results at the follow up, please ask your Primary MD to get all Hospital records sent to his/her office.  In some cases, they will be blood work, cultures and biopsy results pending at the time of your discharge. Please request that your primary care M.D. goes through all the records of your hospital data and follows up on these results.  Also Note the following: If you experience worsening of your admission symptoms, develop shortness of breath, life threatening emergency, suicidal or homicidal thoughts you must seek medical attention immediately by calling 911 or calling your MD immediately  if symptoms less severe.  You must read complete instructions/literature along with all the possible adverse reactions/side effects for all the Medicines you take and that have been prescribed to you. Take any new Medicines after you have completely understood and accpet all the possible adverse reactions/side effects.   Do not drive when taking Pain medications or sleeping medications (Benzodaizepines)  Do not take more than prescribed Pain, Sleep and Anxiety Medications. It is not advisable to combine anxiety,sleep and pain medications without talking with your primary care practitioner  Special Instructions: If you have smoked or chewed Tobacco  in the last 2 yrs please stop smoking, stop any regular Alcohol  and or any Recreational drug use.  Wear Seat belts while driving.  Please note: You were cared for by a hospitalist during your hospital stay. Once you are discharged, your primary care physician will handle any further medical  issues. Please note that NO REFILLS for any discharge medications will be authorized once you are discharged, as it is imperative that you return to your primary care physician (or establish a relationship with a primary care physician if you do not have one) for your post hospital discharge needs so that they can reassess your need for medications and monitor your lab values.  Total Time spent coordinating discharge including counseling, education and face to face time equals greater than 30 minutes.  Signed: Zeus Marquis 04/30/2024 9:30 AM

## 2024-05-01 DIAGNOSIS — I130 Hypertensive heart and chronic kidney disease with heart failure and stage 1 through stage 4 chronic kidney disease, or unspecified chronic kidney disease: Secondary | ICD-10-CM | POA: Diagnosis not present

## 2024-05-01 DIAGNOSIS — J188 Other pneumonia, unspecified organism: Secondary | ICD-10-CM | POA: Diagnosis not present

## 2024-05-01 DIAGNOSIS — I4819 Other persistent atrial fibrillation: Secondary | ICD-10-CM | POA: Diagnosis not present

## 2024-05-01 DIAGNOSIS — I5033 Acute on chronic diastolic (congestive) heart failure: Secondary | ICD-10-CM | POA: Diagnosis not present

## 2024-05-01 DIAGNOSIS — N179 Acute kidney failure, unspecified: Secondary | ICD-10-CM | POA: Diagnosis not present

## 2024-05-01 DIAGNOSIS — N1832 Chronic kidney disease, stage 3b: Secondary | ICD-10-CM | POA: Diagnosis not present

## 2024-05-03 ENCOUNTER — Other Ambulatory Visit: Payer: Self-pay

## 2024-05-03 ENCOUNTER — Inpatient Hospital Stay (HOSPITAL_COMMUNITY)
Admission: EM | Admit: 2024-05-03 | Discharge: 2024-05-05 | DRG: 291 | Disposition: A | Discharge status: Home / Self Care | Attending: Student in an Organized Health Care Education/Training Program | Admitting: Student in an Organized Health Care Education/Training Program

## 2024-05-03 ENCOUNTER — Inpatient Hospital Stay (HOSPITAL_COMMUNITY)

## 2024-05-03 ENCOUNTER — Emergency Department (HOSPITAL_COMMUNITY)

## 2024-05-03 DIAGNOSIS — I5033 Acute on chronic diastolic (congestive) heart failure: Secondary | ICD-10-CM | POA: Diagnosis not present

## 2024-05-03 DIAGNOSIS — Z85828 Personal history of other malignant neoplasm of skin: Secondary | ICD-10-CM

## 2024-05-03 DIAGNOSIS — K219 Gastro-esophageal reflux disease without esophagitis: Secondary | ICD-10-CM | POA: Diagnosis not present

## 2024-05-03 DIAGNOSIS — Z8543 Personal history of malignant neoplasm of ovary: Secondary | ICD-10-CM | POA: Diagnosis not present

## 2024-05-03 DIAGNOSIS — N184 Chronic kidney disease, stage 4 (severe): Secondary | ICD-10-CM | POA: Diagnosis not present

## 2024-05-03 DIAGNOSIS — N179 Acute kidney failure, unspecified: Secondary | ICD-10-CM | POA: Diagnosis not present

## 2024-05-03 DIAGNOSIS — Z66 Do not resuscitate: Secondary | ICD-10-CM | POA: Diagnosis not present

## 2024-05-03 DIAGNOSIS — I4819 Other persistent atrial fibrillation: Secondary | ICD-10-CM | POA: Diagnosis not present

## 2024-05-03 DIAGNOSIS — Z888 Allergy status to other drugs, medicaments and biological substances status: Secondary | ICD-10-CM

## 2024-05-03 DIAGNOSIS — J90 Pleural effusion, not elsewhere classified: Secondary | ICD-10-CM | POA: Diagnosis not present

## 2024-05-03 DIAGNOSIS — Z8673 Personal history of transient ischemic attack (TIA), and cerebral infarction without residual deficits: Secondary | ICD-10-CM

## 2024-05-03 DIAGNOSIS — I130 Hypertensive heart and chronic kidney disease with heart failure and stage 1 through stage 4 chronic kidney disease, or unspecified chronic kidney disease: Secondary | ICD-10-CM | POA: Diagnosis not present

## 2024-05-03 DIAGNOSIS — E038 Other specified hypothyroidism: Secondary | ICD-10-CM | POA: Diagnosis not present

## 2024-05-03 DIAGNOSIS — I2510 Atherosclerotic heart disease of native coronary artery without angina pectoris: Secondary | ICD-10-CM | POA: Diagnosis not present

## 2024-05-03 DIAGNOSIS — Z83438 Family history of other disorder of lipoprotein metabolism and other lipidemia: Secondary | ICD-10-CM

## 2024-05-03 DIAGNOSIS — E8770 Fluid overload, unspecified: Secondary | ICD-10-CM | POA: Diagnosis not present

## 2024-05-03 DIAGNOSIS — Z803 Family history of malignant neoplasm of breast: Secondary | ICD-10-CM | POA: Diagnosis not present

## 2024-05-03 DIAGNOSIS — Z8249 Family history of ischemic heart disease and other diseases of the circulatory system: Secondary | ICD-10-CM | POA: Diagnosis not present

## 2024-05-03 DIAGNOSIS — N200 Calculus of kidney: Secondary | ICD-10-CM | POA: Diagnosis not present

## 2024-05-03 DIAGNOSIS — I509 Heart failure, unspecified: Secondary | ICD-10-CM | POA: Diagnosis not present

## 2024-05-03 DIAGNOSIS — Z7901 Long term (current) use of anticoagulants: Secondary | ICD-10-CM | POA: Diagnosis not present

## 2024-05-03 DIAGNOSIS — Z1152 Encounter for screening for COVID-19: Secondary | ICD-10-CM | POA: Diagnosis not present

## 2024-05-03 DIAGNOSIS — Z9049 Acquired absence of other specified parts of digestive tract: Secondary | ICD-10-CM

## 2024-05-03 DIAGNOSIS — E785 Hyperlipidemia, unspecified: Secondary | ICD-10-CM | POA: Diagnosis present

## 2024-05-03 DIAGNOSIS — Z86000 Personal history of in-situ neoplasm of breast: Secondary | ICD-10-CM | POA: Diagnosis not present

## 2024-05-03 DIAGNOSIS — E875 Hyperkalemia: Principal | ICD-10-CM | POA: Diagnosis present

## 2024-05-03 DIAGNOSIS — I480 Paroxysmal atrial fibrillation: Secondary | ICD-10-CM | POA: Diagnosis not present

## 2024-05-03 DIAGNOSIS — C787 Secondary malignant neoplasm of liver and intrahepatic bile duct: Secondary | ICD-10-CM | POA: Diagnosis not present

## 2024-05-03 DIAGNOSIS — I110 Hypertensive heart disease with heart failure: Secondary | ICD-10-CM | POA: Diagnosis not present

## 2024-05-03 DIAGNOSIS — E8779 Other fluid overload: Secondary | ICD-10-CM | POA: Diagnosis not present

## 2024-05-03 DIAGNOSIS — R918 Other nonspecific abnormal finding of lung field: Secondary | ICD-10-CM | POA: Diagnosis not present

## 2024-05-03 DIAGNOSIS — R001 Bradycardia, unspecified: Secondary | ICD-10-CM | POA: Diagnosis not present

## 2024-05-03 DIAGNOSIS — Z79899 Other long term (current) drug therapy: Secondary | ICD-10-CM | POA: Diagnosis not present

## 2024-05-03 DIAGNOSIS — R932 Abnormal findings on diagnostic imaging of liver and biliary tract: Secondary | ICD-10-CM | POA: Diagnosis not present

## 2024-05-03 DIAGNOSIS — N1832 Chronic kidney disease, stage 3b: Secondary | ICD-10-CM | POA: Diagnosis not present

## 2024-05-03 DIAGNOSIS — D649 Anemia, unspecified: Secondary | ICD-10-CM | POA: Diagnosis not present

## 2024-05-03 DIAGNOSIS — R0602 Shortness of breath: Secondary | ICD-10-CM | POA: Diagnosis not present

## 2024-05-03 DIAGNOSIS — R0989 Other specified symptoms and signs involving the circulatory and respiratory systems: Secondary | ICD-10-CM | POA: Diagnosis not present

## 2024-05-03 DIAGNOSIS — J189 Pneumonia, unspecified organism: Secondary | ICD-10-CM | POA: Diagnosis not present

## 2024-05-03 DIAGNOSIS — R059 Cough, unspecified: Secondary | ICD-10-CM | POA: Diagnosis not present

## 2024-05-03 DIAGNOSIS — J188 Other pneumonia, unspecified organism: Secondary | ICD-10-CM | POA: Diagnosis not present

## 2024-05-03 DIAGNOSIS — J9621 Acute and chronic respiratory failure with hypoxia: Secondary | ICD-10-CM | POA: Diagnosis not present

## 2024-05-03 LAB — CBC WITH DIFFERENTIAL/PLATELET
Abs Immature Granulocytes: 0.4 K/uL — ABNORMAL HIGH (ref 0.00–0.07)
Basophils Absolute: 0.1 K/uL (ref 0.0–0.1)
Basophils Relative: 1 %
Eosinophils Absolute: 0.1 K/uL (ref 0.0–0.5)
Eosinophils Relative: 0 %
HCT: 30 % — ABNORMAL LOW (ref 36.0–46.0)
Hemoglobin: 9.4 g/dL — ABNORMAL LOW (ref 12.0–15.0)
Immature Granulocytes: 3 %
Lymphocytes Relative: 7 %
Lymphs Abs: 1 K/uL (ref 0.7–4.0)
MCH: 29.4 pg (ref 26.0–34.0)
MCHC: 31.3 g/dL (ref 30.0–36.0)
MCV: 93.8 fL (ref 80.0–100.0)
Monocytes Absolute: 1 K/uL (ref 0.1–1.0)
Monocytes Relative: 6 %
Neutro Abs: 13.5 K/uL — ABNORMAL HIGH (ref 1.7–7.7)
Neutrophils Relative %: 83 %
Platelets: 325 K/uL (ref 150–400)
RBC: 3.2 MIL/uL — ABNORMAL LOW (ref 3.87–5.11)
RDW: 15.9 % — ABNORMAL HIGH (ref 11.5–15.5)
WBC: 16.1 K/uL — ABNORMAL HIGH (ref 4.0–10.5)
nRBC: 0 % (ref 0.0–0.2)

## 2024-05-03 LAB — COMPREHENSIVE METABOLIC PANEL WITH GFR
ALT: 169 U/L — ABNORMAL HIGH (ref 0–44)
AST: 216 U/L — ABNORMAL HIGH (ref 15–41)
Albumin: 2.8 g/dL — ABNORMAL LOW (ref 3.5–5.0)
Alkaline Phosphatase: 254 U/L — ABNORMAL HIGH (ref 38–126)
Anion gap: 11 (ref 5–15)
BUN: 35 mg/dL — ABNORMAL HIGH (ref 8–23)
CO2: 23 mmol/L (ref 22–32)
Calcium: 8.8 mg/dL — ABNORMAL LOW (ref 8.9–10.3)
Chloride: 103 mmol/L (ref 98–111)
Creatinine, Ser: 2.66 mg/dL — ABNORMAL HIGH (ref 0.44–1.00)
GFR, Estimated: 17 mL/min — ABNORMAL LOW (ref >60)
Glucose, Bld: 109 mg/dL — ABNORMAL HIGH (ref 70–99)
Potassium: 5.8 mmol/L — ABNORMAL HIGH (ref 3.5–5.1)
Sodium: 137 mmol/L (ref 135–145)
Total Bilirubin: 0.8 mg/dL (ref 0.0–1.2)
Total Protein: 6.1 g/dL — ABNORMAL LOW (ref 6.5–8.1)

## 2024-05-03 LAB — TROPONIN I (HIGH SENSITIVITY)
Troponin I (High Sensitivity): 16 ng/L (ref <18)
Troponin I (High Sensitivity): 15 ng/L (ref <18)

## 2024-05-03 LAB — BRAIN NATRIURETIC PEPTIDE: B Natriuretic Peptide: 859.6 pg/mL — ABNORMAL HIGH (ref 0.0–100.0)

## 2024-05-03 LAB — I-STAT CG4 LACTIC ACID, ED: Lactic Acid, Venous: 1.4 mmol/L (ref 0.5–1.9)

## 2024-05-03 LAB — I-STAT CHEM 8, ED
BUN: 35 mg/dL — ABNORMAL HIGH (ref 8–23)
Calcium, Ion: 1.19 mmol/L (ref 1.15–1.40)
Chloride: 105 mmol/L (ref 98–111)
Creatinine, Ser: 2.9 mg/dL — ABNORMAL HIGH (ref 0.44–1.00)
Glucose, Bld: 112 mg/dL — ABNORMAL HIGH (ref 70–99)
HCT: 29 % — ABNORMAL LOW (ref 36.0–46.0)
Hemoglobin: 9.9 g/dL — ABNORMAL LOW (ref 12.0–15.0)
Potassium: 5.8 mmol/L — ABNORMAL HIGH (ref 3.5–5.1)
Sodium: 138 mmol/L (ref 135–145)
TCO2: 25 mmol/L (ref 22–32)

## 2024-05-03 LAB — MAGNESIUM: Magnesium: 2.1 mg/dL (ref 1.7–2.4)

## 2024-05-03 LAB — RESP PANEL BY RT-PCR (RSV, FLU A&B, COVID)  RVPGX2
Influenza A by PCR: NEGATIVE
Influenza B by PCR: NEGATIVE
Resp Syncytial Virus by PCR: NEGATIVE
SARS Coronavirus 2 by RT PCR: NEGATIVE

## 2024-05-03 MED ORDER — FUROSEMIDE 10 MG/ML IJ SOLN
80.0000 mg | Freq: Once | INTRAMUSCULAR | Status: AC
  Administered 2024-05-03: 80 mg via INTRAVENOUS
  Filled 2024-05-03: qty 8

## 2024-05-03 MED ORDER — ISOSORBIDE MONONITRATE ER 60 MG PO TB24
60.0000 mg | ORAL_TABLET | Freq: Every day | ORAL | Status: DC
  Administered 2024-05-04 – 2024-05-05 (×2): 60 mg via ORAL
  Filled 2024-05-03: qty 1
  Filled 2024-05-03: qty 2

## 2024-05-03 MED ORDER — ROSUVASTATIN CALCIUM 5 MG PO TABS
5.0000 mg | ORAL_TABLET | Freq: Every day | ORAL | Status: DC
  Administered 2024-05-03 – 2024-05-04 (×2): 5 mg via ORAL
  Filled 2024-05-03 (×2): qty 1

## 2024-05-03 MED ORDER — ONDANSETRON HCL 4 MG/2ML IJ SOLN: 4.0000 mg | Freq: Four times a day (QID) | INTRAMUSCULAR | Status: DC | PRN

## 2024-05-03 MED ORDER — SODIUM BICARBONATE 8.4 % IV SOLN
50.0000 meq | Freq: Once | INTRAVENOUS | Status: AC
  Administered 2024-05-03: 50 meq via INTRAVENOUS
  Filled 2024-05-03: qty 50

## 2024-05-03 MED ORDER — ACETAMINOPHEN 325 MG PO TABS
650.0000 mg | ORAL_TABLET | Freq: Four times a day (QID) | ORAL | Status: DC | PRN
Start: 2024-05-03 — End: 2024-05-05

## 2024-05-03 MED ORDER — ACETAMINOPHEN 650 MG RE SUPP: 650.0000 mg | Freq: Four times a day (QID) | RECTAL | Status: DC | PRN

## 2024-05-03 MED ORDER — SODIUM BICARBONATE 650 MG PO TABS
650.0000 mg | ORAL_TABLET | Freq: Two times a day (BID) | ORAL | Status: DC
Start: 2024-05-03 — End: 2024-05-05
  Administered 2024-05-04 – 2024-05-05 (×3): 650 mg via ORAL
  Filled 2024-05-03 (×3): qty 1

## 2024-05-03 MED ORDER — PANTOPRAZOLE SODIUM 20 MG PO TBEC
20.0000 mg | DELAYED_RELEASE_TABLET | Freq: Every day | ORAL | Status: DC
  Administered 2024-05-04 – 2024-05-05 (×2): 20 mg via ORAL
  Filled 2024-05-03 (×2): qty 1

## 2024-05-03 MED ORDER — OXYCODONE HCL 5 MG PO TABS
5.0000 mg | ORAL_TABLET | Freq: Four times a day (QID) | ORAL | Status: DC | PRN
  Administered 2024-05-05: 5 mg via ORAL
  Filled 2024-05-03: qty 1

## 2024-05-03 MED ORDER — METOPROLOL TARTRATE 100 MG PO TABS
100.0000 mg | ORAL_TABLET | Freq: Two times a day (BID) | ORAL | Status: DC
  Administered 2024-05-03 – 2024-05-05 (×4): 100 mg via ORAL
  Filled 2024-05-03 (×2): qty 1
  Filled 2024-05-03 (×2): qty 4

## 2024-05-03 MED ORDER — APIXABAN 2.5 MG PO TABS
2.5000 mg | ORAL_TABLET | Freq: Two times a day (BID) | ORAL | Status: DC
  Administered 2024-05-03 – 2024-05-05 (×4): 2.5 mg via ORAL
  Filled 2024-05-03 (×4): qty 1

## 2024-05-03 MED ORDER — FUROSEMIDE 10 MG/ML IJ SOLN
80.0000 mg | Freq: Two times a day (BID) | INTRAMUSCULAR | Status: DC
  Administered 2024-05-04 – 2024-05-05 (×3): 80 mg via INTRAVENOUS
  Filled 2024-05-03 (×3): qty 8

## 2024-05-03 MED ORDER — HYDROXYCHLOROQUINE SULFATE 200 MG PO TABS
200.0000 mg | ORAL_TABLET | Freq: Every day | ORAL | Status: DC
  Administered 2024-05-04 – 2024-05-05 (×2): 200 mg via ORAL
  Filled 2024-05-03 (×2): qty 1

## 2024-05-03 MED ORDER — ONDANSETRON HCL 4 MG PO TABS: 4.0000 mg | ORAL_TABLET | Freq: Four times a day (QID) | ORAL | Status: DC | PRN

## 2024-05-03 MED ORDER — HYDRALAZINE HCL 25 MG PO TABS
25.0000 mg | ORAL_TABLET | Freq: Three times a day (TID) | ORAL | Status: DC
Start: 2024-05-03 — End: 2024-05-05
  Administered 2024-05-04 – 2024-05-05 (×4): 25 mg via ORAL
  Filled 2024-05-03 (×4): qty 1

## 2024-05-03 MED ORDER — ALBUTEROL SULFATE (2.5 MG/3ML) 0.083% IN NEBU: 2.5000 mg | INHALATION_SOLUTION | RESPIRATORY_TRACT | Status: DC | PRN

## 2024-05-03 MED ORDER — VITAMIN B-12 1000 MCG PO TABS
1000.0000 ug | ORAL_TABLET | Freq: Every day | ORAL | Status: DC
  Administered 2024-05-04 – 2024-05-05 (×2): 1000 ug via ORAL
  Filled 2024-05-03 (×2): qty 1

## 2024-05-03 MED ORDER — CALCIUM GLUCONATE-NACL 1-0.675 GM/50ML-% IV SOLN
1.0000 g | Freq: Once | INTRAVENOUS | Status: AC
  Administered 2024-05-03: 1000 mg via INTRAVENOUS
  Filled 2024-05-03: qty 50

## 2024-05-03 MED ORDER — TIZANIDINE HCL 4 MG PO TABS: 4.0000 mg | ORAL_TABLET | Freq: Two times a day (BID) | ORAL | Status: DC | PRN

## 2024-05-03 MED ORDER — SODIUM ZIRCONIUM CYCLOSILICATE 10 G PO PACK
10.0000 g | PACK | Freq: Once | ORAL | Status: AC
  Administered 2024-05-03: 10 g via ORAL
  Filled 2024-05-03: qty 1

## 2024-05-03 NOTE — Progress Notes (Addendum)
 [facility] NEPHROLOGY PROGRESS NOTE  Assessment/ Plan: Pt is a 89 y.o. yo female with past medical history significant for CHF, stroke, A-fib with RVR, CKD IIIb-IV, recent hospitalization for AKI on CKD, CHF exacerbation presented back today with worsening shortness of breath.  Chest x-ray with bilateral pleural effusion and atelectasis.  The patient was recently hospitalized for hyperkalemia and AKI on CKD required brief period of dialysis.  The HD catheter was removed.  She was discharged with oral diuretics with outpatient follow-up.  During hospitalization she had thoracocentesis for pleural effusion, treated with antibiotics for pneumonia and was seen by oncologist.  The patient is well-known to me, outpatient follow-up and recent hospitalization.  She was discharged on 7/25.  I have seen and examined the patient and also discussed with her daughter at the bedside.  # Acute on chronic hypoxic respiratory failure with evidence of fluid overload: Oxygenation is acceptable.  We will order a dose of IV Lasix  today.  May need pulmonary consult for further evaluation if no improvement in her shortness of breath after diuretics.  Further management per primary team.  # AKI on CKD versus progressive CKD 4: It seems like her renal function is declining with ongoing CHF, A-fib with RVR.  No urgent need for dialysis tonight.  We will manage with diuretics.  Strict ins and outs and daily lab.  Discussed with the primary team.  # Hyperkalemia: Managed medically including diuretics.  # Acute on chronic CHF with preserved EF: Evidence of fluid overload, diuretics as discussed above.  Thank you for the consult, we will continue to follow. Subjective: Seen and examined.  Patient reported that she is short of breath and does not feel well.  Denies nausea, vomiting, chest pain.  Her daughter at the bedside. Objective Vital signs in last 24 hours: Vitals:   05/03/24 1719 05/03/24 1723 05/03/24  1830 05/03/24 1930  BP: (!) 141/51  129/84 (!) 147/58  Pulse: (!) 48  (!) 47 (!) 47  Resp: 20  20 (!) 21  Temp: 97.7 F (36.5 C)     TempSrc: Oral     SpO2: 96%  96% 97%  Weight:  51.3 kg    Height:  5' 8 (1.727 m)     Weight change:  No intake or output data in the 24 hours ending 05/03/24 2019     Labs: RENAL PANEL Recent Labs  Lab 04/27/24 0406 04/27/24 1420 04/28/24 0339 04/29/24 0347 04/30/24 0417 05/03/24 1840 05/03/24 1849  NA 136 134* 137 136 138 137 138  K 5.2* 5.2* 4.7 4.1 3.5 5.8* 5.8*  CL 102 99 102 102 103 103 105  CO2 25 26 26 23 24 23   --   GLUCOSE 199* 240* 111* 122* 150* 109* 112*  BUN 27* 31* 32* 33* 32* 35* 35*  CREATININE 1.59* 1.89* 1.99* 2.06* 2.00* 2.66* 2.90*  CALCIUM  8.1* 8.0* 8.2* 8.1* 8.5* 8.8*  --   MG 1.3*  --  2.3  --   --  2.1  --   PHOS 4.5  --   --   --   --   --   --   ALBUMIN  2.3*  --   --   --   --  2.8*  --     Liver Function Tests: Recent Labs  Lab 04/27/24 0406 05/03/24 1840  AST  --  216*  ALT  --  169*  ALKPHOS  --  254*  BILITOT  --  0.8  PROT  --  6.1*  ALBUMIN  2.3* 2.8*   No results for input(s): LIPASE, AMYLASE in the last 168 hours. No results for input(s): AMMONIA in the last 168 hours. CBC: Recent Labs    12/24/23 0231 12/25/23 0312 04/22/24 2046 04/23/24 0416 04/25/24 0308 05/03/24 1840 05/03/24 1849  HGB 7.4*   < > 8.8* 9.1* 9.2* 9.4* 9.9*  MCV 104.0*   < > 96.0 89.1 91.3 93.8  --   VITAMINB12 1,125*  --   --   --   --   --   --   FOLATE 28.0  --   --   --   --   --   --   FERRITIN 323*  --   --   --   --   --   --   TIBC 225*  --   --   --   --   --   --   IRON 66  --   --   --   --   --   --    < > = values in this interval not displayed.    Cardiac Enzymes: No results for input(s): CKTOTAL, CKMB, CKMBINDEX, TROPONINI in the last 168 hours. CBG: Recent Labs  Lab 04/28/24 0800 04/28/24 1220 04/28/24 1613 04/29/24 0738  GLUCAP 109* 173* 216* 147*    Iron Studies: No  results for input(s): IRON, TIBC, TRANSFERRIN, FERRITIN in the last 72 hours. Studies/Results: DG Chest Port 1 View Result Date: 05/03/2024 CLINICAL DATA:  Shortness of breath and cough. EXAM: PORTABLE CHEST 1 VIEW COMPARISON:  April 27, 2024 FINDINGS: The left-sided venous catheter seen on the prior study has been removed. The cardiac silhouette is mildly enlarged and unchanged in size. There is marked severity calcification of the thoracic aorta. Low lung volumes are seen with subsequent crowding of the bronchovascular lung markings. All mild to moderate severity areas of atelectasis and/or infiltrate are seen within the bilateral lung bases. There are small bilateral pleural effusions. No pneumothorax is identified. Multilevel degenerative changes are seen throughout the thoracic spine. IMPRESSION: 1. Stable cardiomegaly with mild to moderate severity bibasilar atelectasis and/or infiltrate. 2. Small bilateral pleural effusions. Electronically Signed   By: Suzen Dials M.D.   On: 05/03/2024 19:37    Medications: Infusions:  calcium  gluconate      Scheduled Medications:  furosemide   80 mg Intravenous Once   sodium bicarbonate   50 mEq Intravenous Once   sodium zirconium cyclosilicate   10 g Oral Once    have reviewed scheduled and prn medications.  Physical Exam: General: Ill-looking female. Heart:RRR, s1s2 nl Lungs: Better reduced breath sound, mild increased work of breathing Abdomen:soft, Non-tender, non-distended Extremities: Trace no edema Dialysis Access: Removed  Anzley Dibbern Amelie Romney 05/03/2024,8:19 PM  LOS: 0 days

## 2024-05-03 NOTE — H&P (Incomplete)
 History and Physical    [name] FMW:[id number] DOB: 02/22/1934 DOA: 05/03/2024  PCP: Elliot Charm, MD  Patient coming from: home  I have personally briefly reviewed patient's old medical records in [facility]  Chief Complaint:  swelling lower extremity , and sob  HPI: [name] is a 89 y.o. female with medical history significant for BRCA, HTN, HLD, CAD, GERD, CHFpef, stroke, A-fib, CKD IIIb-IV, recent hospitalization for AKI on CKD and CHF exacerbation as well as CVA c/b severe hyperkalemia for which patient emergent HD. Patient with discharge 04/30/24. Patient now returns with complaints of sob and swelling of lower extremity since being home.   ED Course:  IN ED patient was evaluated and found to have fluid overload acute CHF , AKI on CKDIII-IV  as well as hyperkalemia.  Patient was discussed with nephrology who recommended hyperkalemia protocol, lasix  80mg  , And note no urgent need for HD. EKG: nsr  Vitals: 97.2, bp 141/51, hr 48, rr 20 RVP: neg Wbc 16.1, hgb 9.4,  plt 325,  Na 137,K 5.8, cl 103, glu 109, cr 2.66  (2) AST 216, ALT 169, Alphos 254 BNP 859.6  CXR IMPRESSION: 1. Stable cardiomegaly with mild to moderate severity bibasilar atelectasis and/or infiltrate. 2. Small bilateral pleural effusions.  Tx Na bicarb, calcium  gluconate , lasix , lokelma   Review of Systems: As per HPI otherwise 10 point review of systems negative.   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, BORDERLINE OVARIAN CANCER VERSUS PRIMARY PERITONEAL CARCINOMA WITH PSAMMOMA BODIES--  NO RECURRENCE   History of skin cancer    excision basal cell   Hyperlipidemia    Hypertension    Kidney stones 04/2013   New onset a-fib (HCC) 11/24/2023   Nocturia    Ovarian carcinoma (HCC)    papillary serous-low grade, recurrence found in fat necrosis during ileostomy   Renal stones 2014   Shingles 30 YRS AGO   Ureteral calculi    bilateral    Past Surgical History:  Procedure Laterality Date   APPENDECTOMY     BREAST LUMPECTOMY Right 04-13-2009   W/ SLN BX   BREAST LUMPECTOMY WITH RADIOACTIVE SEED LOCALIZATION Left 06/11/2019   Procedure: LEFT BREAST LUMPECTOMY WITH RADIOACTIVE SEED LOCALIZATION;  Surgeon: Curvin Deward MOULD, MD;  Location: [facility];  Service: General;  Laterality: Left;   CARDIAC CATHETERIZATION  11-11-2005 DR VICTORY SHARPS   MODERATE LAD DISEASE/ NORMAL LVF/ EF 65-75%   CATARACT EXTRACTION W/ INTRAOCULAR LENS IMPLANT Bilateral 04/26/2013   CHOLECYSTECTOMY  1990   OPEN   COLONOSCOPY     CYSTOSCOPY W/ URETERAL STENT PLACEMENT Bilateral 05/07/2013   Procedure: CYSTOSCOPY WITH STENT REPLACEMENTS;  Surgeon: Ricardo Likens, MD;  Location: [facility];  Service: Urology;  Laterality: Bilateral;   CYSTOSCOPY WITH BIOPSY  N/A 04/07/2013   Procedure: CYSTOSCOPY WITH BIOPSY and fulgeration;  Surgeon: Ricardo Likens, MD;  Location: [facility];  Service: Urology;  Laterality: N/A;   CYSTOSCOPY WITH RETROGRADE PYELOGRAM, URETEROSCOPY AND STENT PLACEMENT Bilateral 05/07/2013   Procedure: CYSTOSCOPY WITH RETROGRADE PYELOGRAM, URETEROSCOPY ;  Surgeon: Ricardo Likens, MD;  Location: [facility];  Service: Urology;  Laterality: Bilateral;   CYSTOSCOPY WITH RETROGRADE PYELOGRAM, URETEROSCOPY AND STENT PLACEMENT Bilateral 01/06/2019   Procedure: CYSTOSCOPY WITH RETROGRADE PYELOGRAM, URETEROSCOPY AND STENT  PLACEMENT, FIRST STAGE;  Surgeon: Likens Ricardo, MD;  Location: [facility];  Service: Urology;  Laterality: Bilateral;   CYSTOSCOPY WITH RETROGRADE PYELOGRAM, URETEROSCOPY AND STENT PLACEMENT Bilateral 01/27/2019   Procedure: CYSTOSCOPY WITH RETROGRADE PYELOGRAM, URETEROSCOPY AND STENT PLACEMENT;  Surgeon: Likens Ricardo, MD;  Location: [facility];  Service: Urology;  Laterality: Bilateral;  75 MINS   CYSTOSCOPY WITH STENT PLACEMENT Bilateral 04/07/2013   Procedure: CYSTOSCOPY WITH STENT PLACEMENT;  Surgeon: Ricardo Likens, MD;  Location: [facility];  Service: Urology;  Laterality: Bilateral;   DX LAPAROSCOPY W/ PERITONEAL AND OMENTAL BX'S AND WASHINGS  02-16-2008   EXCISION RIGHT BREAST MASS  02-10-2002   EXP. LAP. EXTENSIVE ADHESIOLYSIS/ RESECTION TERMINAL ILEUM , ASCENDING AND DESCENDING COLON WITH CREATION ILEOSTOMY AND MUCOUS FISTULA  02-19-2008   PERFERATION AND ABD. CANCER--  TAKEDOWN ILEOSTOMY 08-23-2008   HOLMIUM LASER APPLICATION Bilateral 01/06/2019   Procedure: HOLMIUM LASER APPLICATION;  Surgeon: Likens Ricardo, MD;  Location: [facility];  Service: Urology;  Laterality: Bilateral;   HOLMIUM LASER APPLICATION Bilateral 01/27/2019   Procedure: HOLMIUM LASER APPLICATION;  Surgeon: Likens Ricardo, MD;  Location: [facility];  Service: Urology;  Laterality: Bilateral;   I & D EXTREMITY Right 02/02/2022   Procedure: IRRIGATION AND DEBRIDEMENT HAND;  Surgeon: Shari Easter, MD;  Location[facility];  Service: Orthopedics;  Laterality: Right;   ILEOSTOMY CLOSURE  08/2008   TOTAL ABDOMINAL HYSTERECTOMY W/ BILATERAL SALPINGOOPHORECTOMY  1976   TOTAL ABDOMINAL HYSTERECTOMY W/ BILATERAL SALPINGOOPHORECTOMY  1976   VULVAR LESION REMOVAL N/A 08/22/2014   Procedure: EXCISION OF VULVAR CYST  ;  Surgeon: Ronal Elvie Pinal, MD;  Location: [facility];  Service: Gynecology;  Laterality: N/A;   WOUND DEBRIDEMENT  05/28/2012   Procedure: DEBRIDEMENT ABDOMINAL WOUND;  Surgeon: Sherlean JINNY Laughter, MD;  Location: [facility];  Service: General;  Laterality: N/A;  excision chronic wound abdominal wall     reports that she has never smoked. She has never used smokeless tobacco. She reports that she does not drink alcohol and does not use drugs.  Allergies  Allergen Reactions   Ramipril Itching    Family History  Problem Relation Age of Onset   Heart disease Mother    Hyperlipidemia Mother    Breast cancer Sister    Cancer Sister        breast    Prior to Admission medications   Medication Sig Start Date End Date Taking? Authorizing Provider  apixaban  (ELIQUIS ) 2.5 MG TABS tablet Take 1 tablet (2.5 mg total) by mouth 2 (two) times daily. 04/30/24   Ghimire, Donalda HERO, MD  BELSOMRA 10 MG TABS Take 10 mg by mouth at bedtime. 01/20/19   [provider]  Calcium  Carbonate-Vitamin D  (CALTRATE 600+D PO) Take 1 tablet by mouth 2 (two) times daily.    [provider]  Cholecalciferol  (VITAMIN D3) 50 MCG (2000 UT) TABS Take 2,000 Units by mouth in the morning and at bedtime.    [provider]  cyanocobalamin  (VITAMIN  B12) 1000 MCG tablet Take 1,000 mcg by mouth daily.    [provider]  diltiazem  (TIAZAC ) 360 MG 24 hr capsule Take 360 mg by mouth daily. 01/29/24   [provider]  hydrALAZINE  (APRESOLINE ) 25 MG tablet Take 1 tablet (25 mg total) by mouth every 8 (eight) hours. 04/30/24   Ghimire, Donalda HERO, MD  hydroxychloroquine  (PLAQUENIL ) 200 MG tablet Take 200 mg by mouth daily. 11/08/21   [provider]  isosorbide  mononitrate (IMDUR ) 60 MG 24 hr tablet Take 1 tablet (60 mg total) by mouth daily. 05/16/23   Jeffrie Oneil BROCKS, MD  magnesium  oxide (MAG-OX) 400 MG tablet Take 2 tablets (800 mg total) by mouth 2 (two) times daily. 02/27/24   [name], MD  melatonin 3 MG TABS tablet Take 1 tablet (3 mg total) by mouth at bedtime as needed. Patient taking differently: Take 3 mg by mouth at bedtime as needed (sleep). 12/29/23   [name], MD  metoprolol   tartrate (LOPRESSOR ) 100 MG tablet Take 1 tablet (100 mg total) by mouth 2 (two) times daily. 04/30/24 07/29/24  GhimireDonalda HERO, MD  Multiple Vitamins-Minerals (DECUBI-VITE PO) Take 1 capsule by mouth daily at 6 (six) AM. For wound healing    [provider]  Multiple Vitamins-Minerals (MULTIVITAMIN WOMEN 50+) TABS Take 1 tablet by mouth daily with breakfast.    [provider]  Neomycin-Bacitracin-Polymyxin (TRIPLE ANTIBIOTIC) OINT Apply topically daily. To skin tears prn    [provider]  oxyCODONE  (OXY IR/ROXICODONE ) 5 MG immediate release tablet Take 1 tablet (5 mg total) by mouth every 6 (six) hours as needed. 03/26/24   [name], NP  pantoprazole  (PROTONIX ) 20 MG tablet Take 1 tablet (20 mg total) by mouth daily. 12/05/23   Ezenduka, Nkeiruka J, MD  Potassium Chloride  ER 20 MEQ TBCR Take 1 tablet by mouth 2 (two) times daily. 04/07/24   [provider]  rosuvastatin  (CRESTOR ) 10 MG tablet Take 0.5 tablets (5 mg total) by mouth at bedtime. 04/30/24   Ghimire, Donalda HERO, MD  sodium bicarbonate  650 MG tablet Take 1 tablet (650 mg total) by mouth 2 (two) times daily. 12/08/23   [name], [name], MD  tiZANidine  (ZANAFLEX ) 4 MG tablet Take 4 mg by mouth 2 (two) times daily as needed for muscle spasms. 10/10/23   [provider]  torsemide  (DEMADEX ) 20 MG tablet Take 1 tablet (20 mg total) by mouth daily. 05/01/24   Ghimire, Donalda HERO, MD  triamcinolone  (KENALOG) 0.1 % Apply 1 application  topically as needed (for rashes- affected areas). 04/04/20   [provider]    Physical Exam: Vitals:   05/03/24 1723 05/03/24 1830 05/03/24 1930 05/03/24 2121  BP:  129/84 (!) 147/58   Pulse:  (!) 47 (!) 47   Resp:  20 (!) 21   Temp:    (!) 97.2 F (36.2 C)  TempSrc:    Oral  SpO2:  96% 97%   Weight: 51.3 kg     Height: 5' 8 (1.727 m)       Constitutional: NAD, calm, comfortable Vitals:   05/03/24 1723 05/03/24 1830 05/03/24 1930 05/03/24 2121  BP:   129/84 (!) 147/58   Pulse:  (!) 47 (!) 47   Resp:  20 (!) 21   Temp:    (!) 97.2 F (36.2 C)  TempSrc:    Oral  SpO2:  96% 97%   Weight: 51.3 kg     Height: 5' 8 (1.727  m)      Eyes: PERRL, lids and conjunctivae normal ENMT: Mucous membranes are moist. Posterior pharynx clear of any exudate or lesions.Normal dentition.  Neck: normal, supple, no masses, no thyromegaly Respiratory: clear to auscultation bilaterally, no wheezing, no crackles. Normal respiratory effort. No accessory muscle use.  Cardiovascular: Regular rate and rhythm, no murmurs / rubs / gallops. No extremity edema. 2+ pedal pulses. No carotid bruits.  Abdomen: no tenderness, no masses palpated. No hepatosplenomegaly. Bowel sounds positive.  Musculoskeletal: no clubbing / cyanosis. No joint deformity upper and lower extremities. Good ROM, no contractures. Normal muscle tone.  Skin: no rashes, lesions, ulcers. No induration Neurologic: CN 2-12 grossly intact. Sensation intact, DTR normal. Strength 5/5 in all 4.  Psychiatric: Normal judgment and insight. Alert and oriented x 3. Normal mood.    Labs on Admission: I have personally reviewed following labs and imaging studies  CBC: Recent Labs  Lab 05/03/24 1840 05/03/24 1849  WBC 16.1*  --   NEUTROABS 13.5*  --   HGB 9.4* 9.9*  HCT 30.0* 29.0*  MCV 93.8  --   PLT 325  --    Basic Metabolic Panel: Recent Labs  Lab 04/27/24 0406 04/27/24 1420 04/28/24 0339 04/29/24 0347 04/30/24 0417 05/03/24 1840 05/03/24 1849  NA 136 134* 137 136 138 137 138  K 5.2* 5.2* 4.7 4.1 3.5 5.8* 5.8*  CL 102 99 102 102 103 103 105  CO2 25 26 26 23 24 23   --   GLUCOSE 199* 240* 111* 122* 150* 109* 112*  BUN 27* 31* 32* 33* 32* 35* 35*  CREATININE 1.59* 1.89* 1.99* 2.06* 2.00* 2.66* 2.90*  CALCIUM  8.1* 8.0* 8.2* 8.1* 8.5* 8.8*  --   MG 1.3*  --  2.3  --   --  2.1  --   PHOS 4.5  --   --   --   --   --   --    GFR: Estimated Creatinine Clearance: 11.1 mL/min (A) (by C-G  formula based on SCr of 2.9 mg/dL (H)). Liver Function Tests: Recent Labs  Lab 04/27/24 0406 05/03/24 1840  AST  --  216*  ALT  --  169*  ALKPHOS  --  254*  BILITOT  --  0.8  PROT  --  6.1*  ALBUMIN  2.3* 2.8*   No results for input(s): LIPASE, AMYLASE in the last 168 hours. No results for input(s): AMMONIA in the last 168 hours. Coagulation Profile: No results for input(s): INR, PROTIME in the last 168 hours. Cardiac Enzymes: No results for input(s): CKTOTAL, CKMB, CKMBINDEX, TROPONINI in the last 168 hours. BNP (last 3 results) No results for input(s): PROBNP in the last 8760 hours. HbA1C: No results for input(s): HGBA1C in the last 72 hours. CBG: Recent Labs  Lab 04/28/24 0800 04/28/24 1220 04/28/24 1613 04/29/24 0738  GLUCAP 109* 173* 216* 147*   Lipid Profile: No results for input(s): CHOL, HDL, LDLCALC, TRIG, CHOLHDL, LDLDIRECT in the last 72 hours. Thyroid  Function Tests: No results for input(s): TSH, T4TOTAL, FREET4, T3FREE, THYROIDAB in the last 72 hours. Anemia Panel: No results for input(s): VITAMINB12, FOLATE, FERRITIN, TIBC, IRON, RETICCTPCT in the last 72 hours. Urine analysis:    Component Value Date/Time   COLORURINE YELLOW 11/21/2023 1840   APPEARANCEUR CLEAR 11/21/2023 1840   LABSPEC 1.016 11/21/2023 1840   PHURINE 5.0 11/21/2023 1840   GLUCOSEU NEGATIVE 11/21/2023 1840   HGBUR NEGATIVE 11/21/2023 1840   BILIRUBINUR NEGATIVE 11/21/2023 1840   BILIRUBINUR n 09/15/2015  1123   KETONESUR NEGATIVE 11/21/2023 1840   PROTEINUR NEGATIVE 11/21/2023 1840   UROBILINOGEN negative 09/15/2015 1123   UROBILINOGEN 0.2 04/11/2009 1015   NITRITE NEGATIVE 11/21/2023 1840   LEUKOCYTESUR NEGATIVE 11/21/2023 1840    Radiological Exams on Admission: DG Chest Port 1 View Result Date: 05/03/2024 CLINICAL DATA:  Shortness of breath and cough. EXAM: PORTABLE CHEST 1 VIEW COMPARISON:  April 27, 2024 FINDINGS: The  left-sided venous catheter seen on the prior study has been removed. The cardiac silhouette is mildly enlarged and unchanged in size. There is marked severity calcification of the thoracic aorta. Low lung volumes are seen with subsequent crowding of the bronchovascular lung markings. All mild to moderate severity areas of atelectasis and/or infiltrate are seen within the bilateral lung bases. There are small bilateral pleural effusions. No pneumothorax is identified. Multilevel degenerative changes are seen throughout the thoracic spine. IMPRESSION: 1. Stable cardiomegaly with mild to moderate severity bibasilar atelectasis and/or infiltrate. 2. Small bilateral pleural effusions. Electronically Signed   By: Suzen Dials M.D.   On: 05/03/2024 19:37    EKG: Independently reviewed. ***  Assessment/Plan  Hyperkalemia  - in setting of worsening renal function  - s/p hyperkalemia protocol in ED -repeat labs  -f/u on further renal recs   AKI on CKDIIIb -hold nephrotoxic medications  - no need for emergent HD per renal  - await further renal recs -repeat labs  pending  Acute CHFpef exacerbation  - manage with iv diuresis  - continue with lasix  80 mg iv bid - await further renal input   Hx of Afib   Recent CVA  - continue on secondary ppx   rBRCA -s/p treatment in remission   HLD -resume home regimen   HTN  -stable  -continue home regimen   r DVT prophylaxis:  Heparin   Code Status: full/ as discussed per patient wishes in event of cardiac arrest  Family Communication: none at bedside Disposition Plan: patient  expected to be admitted greater than 2 midnights  Consults called: nephrology Admission status: progressive care    Camila DELENA Ned MD [facility]  If 7PM-7AM, please contact night-coverage [email or web address] Password [email or web address]  05/03/2024, 10:14 PM

## 2024-05-03 NOTE — ED Notes (Signed)
 EMS REPORT:  Coming from Home for SOB/cough  Hx pneumonia s/p 1 month ago S/p stroke 1 week ago - started on thinners   [facility]   Vitals with EMS: BP 150/90 HR 50 A flutter  RR 24 Spo2 97 on RA  20G RAC No meds/BG check

## 2024-05-03 NOTE — ED Provider Notes (Signed)
 [place] EMERGENCY DEPARTMENT AT [facility] Provider Note   CSN: [id number] Arrival date & time: 05/03/24  1710     Patient presents with: Shortness of Breath and Cough   [name] is a 89 y.o. female.    Shortness of Breath Associated symptoms: cough   Cough Associated symptoms: shortness of breath   Patient has PMH of acute renal failure with hyperkalemia( last reported dialysis session 7/17), HFpEF, CVA, atrial fibrillation on eliquis , hypertension, metastatic breast cancer that presents with 2-3 weeks of worsening shortness of breath. Patient's daughter notes that patient was working at home with physical therapy today and when PT was doing vital signs, patient was having low blood pressure and low heart rate. PT advised daughter that she should call EMS to take patient to the hospital. Patient does not use any oxygen at baseline.She denies any productive cough, but does note that she feels some congestion in her chest. Patient denies any fever or chills or recent sick contacts. Patient also denies any chest pain or palpitations.  Patient was discharged on 04/30/2024 after being treated for acute renal failure. She was found at that time to have bilateral pneumonia and completed course of unasyn  and zithromax  while inpatient. Per discharge summary, does not seem that patient was discharged on any antibiotics. Patient reports that she felt that shortness of breath has gotten worse since then.      Prior to Admission medications   Medication Sig Start Date End Date Taking? Authorizing Provider  apixaban  (ELIQUIS ) 2.5 MG TABS tablet Take 1 tablet (2.5 mg total) by mouth 2 (two) times daily. 04/30/24   Ghimire, Donalda CHRISTELLA, MD  BELSOMRA 10 MG TABS Take 10 mg by mouth at bedtime. 01/20/19   [provider]  Calcium  Carbonate-Vitamin D  (CALTRATE 600+D PO) Take 1 tablet by mouth 2 (two) times daily.    [provider]  Cholecalciferol  (VITAMIN D3) 50 MCG  (2000 UT) TABS Take 2,000 Units by mouth in the morning and at bedtime.    [provider]  cyanocobalamin  (VITAMIN B12) 1000 MCG tablet Take 1,000 mcg by mouth daily.    [provider]  diltiazem  (TIAZAC ) 360 MG 24 hr capsule Take 360 mg by mouth daily. 01/29/24   [provider]  hydrALAZINE  (APRESOLINE ) 25 MG tablet Take 1 tablet (25 mg total) by mouth every 8 (eight) hours. 04/30/24   Ghimire, Donalda CHRISTELLA, MD  hydroxychloroquine  (PLAQUENIL ) 200 MG tablet Take 200 mg by mouth daily. 11/08/21   [provider]  isosorbide  mononitrate (IMDUR ) 60 MG 24 hr tablet Take 1 tablet (60 mg total) by mouth daily. 05/16/23   Jeffrie Oneil BROCKS, MD  magnesium  oxide (MAG-OX) 400 MG tablet Take 2 tablets (800 mg total) by mouth 2 (two) times daily. 02/27/24   [name], MD  melatonin 3 MG TABS tablet Take 1 tablet (3 mg total) by mouth at bedtime as needed. Patient taking differently: Take 3 mg by mouth at bedtime as needed (sleep). 12/29/23   [name], MD  metoprolol  tartrate (LOPRESSOR ) 100 MG tablet Take 1 tablet (100 mg total) by mouth 2 (two) times daily. 04/30/24 07/29/24  GhimireDonalda CHRISTELLA, MD  Multiple Vitamins-Minerals (DECUBI-VITE PO) Take 1 capsule by mouth daily at 6 (six) AM. For wound healing    [provider]  Multiple Vitamins-Minerals (MULTIVITAMIN WOMEN 50+) TABS Take 1 tablet by mouth daily with breakfast.    [provider]  Neomycin-Bacitracin-Polymyxin (TRIPLE ANTIBIOTIC) OINT Apply  topically daily. To skin tears prn    [provider]  oxyCODONE  (OXY IR/ROXICODONE ) 5 MG immediate release tablet Take 1 tablet (5 mg total) by mouth every 6 (six) hours as needed. 03/26/24   [name], NP  pantoprazole  (PROTONIX ) 20 MG tablet Take 1 tablet (20 mg total) by mouth daily. 12/05/23   Ezenduka, Nkeiruka J, MD  Potassium Chloride  ER 20 MEQ TBCR Take 1 tablet by mouth 2 (two) times daily. 04/07/24   [provider]  rosuvastatin   (CRESTOR ) 10 MG tablet Take 0.5 tablets (5 mg total) by mouth at bedtime. 04/30/24   Ghimire, Donalda HERO, MD  sodium bicarbonate  650 MG tablet Take 1 tablet (650 mg total) by mouth 2 (two) times daily. 12/08/23   [name], [name], MD  tiZANidine  (ZANAFLEX ) 4 MG tablet Take 4 mg by mouth 2 (two) times daily as needed for muscle spasms. 10/10/23   [provider]  torsemide  (DEMADEX ) 20 MG tablet Take 1 tablet (20 mg total) by mouth daily. 05/01/24   Ghimire, Donalda HERO, MD  triamcinolone  (KENALOG) 0.1 % Apply 1 application  topically as needed (for rashes- affected areas). 04/04/20   [provider]    Allergies: Ramipril    Review of Systems  Respiratory:  Positive for cough and shortness of breath.   Negative except for as noted in the HPI   Updated Vital Signs BP (!) 147/58   Pulse (!) 47   Temp 97.7 F (36.5 C) (Oral)   Resp (!) 21   Ht 5' 8 (1.727 m)   Wt 51.3 kg   LMP 10/07/1974   SpO2 97%   BMI 17.18 kg/m   Physical Exam Neck:     Comments: Temporary hemodialysis site. Non bleeding and non tender.  Cardiovascular:     Rate and Rhythm: Regular rhythm. Bradycardia present.  Pulmonary:     Comments: Mild shortness of breath on room air.  Posterior crackles at lower left lung base, other wise clear to auscultation.  Abdominal:     Palpations: Abdomen is soft.     Tenderness: There is no abdominal tenderness.  Musculoskeletal:     Comments: Bilateral trace lower extremity pitting edema.   Neurological:     Mental Status: She is alert.  Psychiatric:        Mood and Affect: Mood normal.     (all labs ordered are listed, but only abnormal results are displayed) Labs Reviewed  CBC WITH DIFFERENTIAL/PLATELET - Abnormal; Notable for the following components:      Result Value   WBC 16.1 (*)    RBC 3.20 (*)    Hemoglobin 9.4 (*)    HCT 30.0 (*)    RDW 15.9 (*)    Neutro Abs 13.5 (*)    Abs Immature Granulocytes 0.40 (*)    All other components within normal  limits  COMPREHENSIVE METABOLIC PANEL WITH GFR - Abnormal; Notable for the following components:   Potassium 5.8 (*)    Glucose, Bld 109 (*)    BUN 35 (*)    Creatinine, Ser 2.66 (*)    Calcium  8.8 (*)    Total Protein 6.1 (*)    Albumin  2.8 (*)    AST 216 (*)    ALT 169 (*)    Alkaline Phosphatase 254 (*)    GFR, Estimated 17 (*)    All other components within normal limits  BRAIN NATRIURETIC PEPTIDE - Abnormal; Notable for the following components:   B Natriuretic  Peptide 859.6 (*)    All other components within normal limits  I-STAT CHEM 8, ED - Abnormal; Notable for the following components:   Potassium 5.8 (*)    BUN 35 (*)    Creatinine, Ser 2.90 (*)    Glucose, Bld 112 (*)    Hemoglobin 9.9 (*)    HCT 29.0 (*)    All other components within normal limits  RESP PANEL BY RT-PCR (RSV, FLU A&B, COVID)  RVPGX2  CULTURE, BLOOD (ROUTINE X 2)  CULTURE, BLOOD (ROUTINE X 2)  MAGNESIUM   RENAL FUNCTION PANEL  I-STAT CG4 LACTIC ACID, ED  I-STAT CG4 LACTIC ACID, ED  TROPONIN I (HIGH SENSITIVITY)  TROPONIN I (HIGH SENSITIVITY)    EKG: Bradycardia with aflutter   Radiology: [facility] Chest Port 1 View Result Date: 05/03/2024 CLINICAL DATA:  Shortness of breath and cough. EXAM: PORTABLE CHEST 1 VIEW COMPARISON:  April 27, 2024 FINDINGS: The left-sided venous catheter seen on the prior study has been removed. The cardiac silhouette is mildly enlarged and unchanged in size. There is marked severity calcification of the thoracic aorta. Low lung volumes are seen with subsequent crowding of the bronchovascular lung markings. All mild to moderate severity areas of atelectasis and/or infiltrate are seen within the bilateral lung bases. There are small bilateral pleural effusions. No pneumothorax is identified. Multilevel degenerative changes are seen throughout the thoracic spine. IMPRESSION: 1. Stable cardiomegaly with mild to moderate severity bibasilar atelectasis and/or infiltrate. 2. Small  bilateral pleural effusions. Electronically Signed   By: Suzen Dials M.D.   On: 05/03/2024 19:37     Procedures   Medications Ordered in the ED  calcium  gluconate 1 g/ 50 mL sodium chloride  IVPB (1,000 mg Intravenous New Bag/Given 05/03/24 2022)  sodium zirconium cyclosilicate  (LOKELMA ) packet 10 g (10 g Oral Given 05/03/24 2023)  sodium bicarbonate  injection 50 mEq (50 mEq Intravenous Given 05/03/24 2022)  furosemide  (LASIX ) injection 80 mg (80 mg Intravenous Given 05/03/24 2023)                                    Medical Decision Making Amount and/or Complexity of Data Reviewed Labs: ordered. Radiology: ordered. ECG/medicine tests: ordered.  Risk Prescription drug management. Decision regarding hospitalization.   Differentials include cardiac arrhythmias, acute renal failure, acute heart failure exacerbation, pneumonia, pneumothorax or hemothorax, and pulmonary embolism. Patient has been afebrile and completed course of inpatient antibiotics, but will check for signs of worsening pneumonia or other lung pathology such as pneumothorax. Patient has not had any trauma making differential for pneumothorax or hemothorax lower on the differential. Patient does have a history of atrial flutter, fibrillation. Per interpretation of EKG she is in aflutter with slower rate.CMP and magnesium  ordered to look for any signs of electrolyte abnormalities that could be contributing to slower rates.Pulmonary embolism lower on differential due to low heart rate, lack of chest pain, and no sudden worsening of shortness of breath. BNP ordered to look for signs of volume overload in the setting of recent history of acute renal failure requiring HD.   19:05 lokelma , sodium bicarbonate , calcium  gluconate given due to hyperkalemia at 5.8.  19:12 called nephrology due to concern for acute renal failure with hyperkalemia. Recommends 80 mg of lasix .  20:18 Consulted hospitalist   Labs were reviewed and  potassium came back at 5.8 and which was up from 3.5 at time of discharge on 7/25. Creatinine up  from 2 at time of discharge to 2.90.BNP came back at 859.6 but this lower that patient's usually baseline per previous hospital admissions. Lactic acid within normal limits and troponin not elevated which is reassuring against any signs of ischemia or shock. WBC count elevated at 16.1, no signs of leukocytosis at time of discharge. Patient has been afebrile. Blood cultures were taken and respiratory viral panel was negative. Unsure if there is an infectious source or if this could be reactive in the setting of patient's metastatic breast cancer. Concern at this time is for increased creatinine, with mild hyperkalemia suggesting acute renal failure.Patient had a similar presentation a few weeks ago when admitted to the hospital and required dialysis.  Does not seem to be a need for urgent dialysis at this time, but continued monitoring of electrolytes and kidney function. Nephrology and hospitalist consulted for management of acute renal failure with hyperkalemia.       Final diagnoses:  None    ED Discharge Orders     None          D'Mello, Ignace Mandigo, DO 05/03/24 2249    Patt Alm Macho, MD 05/03/24 2308

## 2024-05-03 NOTE — ED Triage Notes (Signed)
 Pt presents via EMS from home with CC of SOB and cough. The patient appears to be in Aflutter at a rate of 50, which she says is normal for her. She takes eliquis . Endorses cough as feeling congested but no productive sputum. The patient denies fevers/chill. Pt is AxOx4 and walks with walker at baseline.

## 2024-05-04 ENCOUNTER — Encounter (HOSPITAL_COMMUNITY): Payer: Self-pay | Admitting: Internal Medicine

## 2024-05-04 ENCOUNTER — Telehealth: Payer: Self-pay | Admitting: *Deleted

## 2024-05-04 DIAGNOSIS — E8779 Other fluid overload: Secondary | ICD-10-CM | POA: Diagnosis not present

## 2024-05-04 DIAGNOSIS — N184 Chronic kidney disease, stage 4 (severe): Secondary | ICD-10-CM

## 2024-05-04 DIAGNOSIS — E875 Hyperkalemia: Secondary | ICD-10-CM | POA: Diagnosis not present

## 2024-05-04 DIAGNOSIS — J9621 Acute and chronic respiratory failure with hypoxia: Secondary | ICD-10-CM | POA: Diagnosis not present

## 2024-05-04 DIAGNOSIS — I130 Hypertensive heart and chronic kidney disease with heart failure and stage 1 through stage 4 chronic kidney disease, or unspecified chronic kidney disease: Secondary | ICD-10-CM | POA: Diagnosis not present

## 2024-05-04 DIAGNOSIS — I509 Heart failure, unspecified: Secondary | ICD-10-CM | POA: Diagnosis not present

## 2024-05-04 DIAGNOSIS — N179 Acute kidney failure, unspecified: Secondary | ICD-10-CM | POA: Diagnosis not present

## 2024-05-04 DIAGNOSIS — D649 Anemia, unspecified: Secondary | ICD-10-CM | POA: Diagnosis not present

## 2024-05-04 LAB — RENAL FUNCTION PANEL
Albumin: 2.8 g/dL — ABNORMAL LOW (ref 3.5–5.0)
Anion gap: 13 (ref 5–15)
BUN: 35 mg/dL — ABNORMAL HIGH (ref 8–23)
CO2: 22 mmol/L (ref 22–32)
Calcium: 9.9 mg/dL (ref 8.9–10.3)
Chloride: 100 mmol/L (ref 98–111)
Creatinine, Ser: 2.37 mg/dL — ABNORMAL HIGH (ref 0.44–1.00)
GFR, Estimated: 19 mL/min — ABNORMAL LOW (ref >60)
Glucose, Bld: 75 mg/dL (ref 70–99)
Phosphorus: 4.6 mg/dL (ref 2.5–4.6)
Potassium: 4.1 mmol/L (ref 3.5–5.1)
Sodium: 135 mmol/L (ref 135–145)

## 2024-05-04 LAB — CBC
HCT: 30 % — ABNORMAL LOW (ref 36.0–46.0)
Hemoglobin: 9.4 g/dL — ABNORMAL LOW (ref 12.0–15.0)
MCH: 29.1 pg (ref 26.0–34.0)
MCHC: 31.3 g/dL (ref 30.0–36.0)
MCV: 92.9 fL (ref 80.0–100.0)
Platelets: 263 K/uL (ref 150–400)
RBC: 3.23 MIL/uL — ABNORMAL LOW (ref 3.87–5.11)
RDW: 15.9 % — ABNORMAL HIGH (ref 11.5–15.5)
WBC: 20.5 K/uL — ABNORMAL HIGH (ref 4.0–10.5)
nRBC: 0 % (ref 0.0–0.2)

## 2024-05-04 NOTE — Progress Notes (Signed)
 PROGRESS NOTE  DANIALLE DEMENT    DOB: 01-06-1934, 89 y.o.  FMW:[id number]    Code Status: Do not attempt resuscitation (DNR) PRE-ARREST INTERVENTIONS DESIRED   DOA: 05/03/2024   LOS: 1   Brief hospital course  AZULA ZAPPIA is a 89 y.o. female with medical history significant for BRCA, HTN, HLD, CAD, GERD, CHFpef, stroke, A-fib, CKD IIIb-IV, recent hospitalization for AKI on CKD and CHF exacerbation as well as CVA c/b severe hyperkalemia for which patient received emergent HD. Patient with discharge 04/30/24. Patient now returns with complaints of sob and fluid overload.    ED Course: EKG: nsr, Vitals: 97.2, bp 141/51, hr 48, rr 20 RVP: neg Wbc 16.1, hgb 9.4,  plt 325,  Na 137,K 5.8, cl 103, glu 109, cr 2.66  (2) AST 216, ALT 169, Alphos 254 BNP 859.6   CXR: 1. Stable cardiomegaly with mild to moderate severity bibasilar atelectasis and/or infiltrate. 2. Small bilateral pleural effusions.   Tx Na bicarb, calcium  gluconate , lasix , lokelma . Nephrology was consulted.   05/04/24 -still short of breath but much improved- endorses good UOP and improvement in leg swelling. Family interested in dc to ALF.   Assessment & Plan  Principal Problem:   Hyperkalemia  Hypervolemia- multifactorial. Patient needs to have accurate assessment of dry weight and an at-home get-well plan for fluid management. She was dehydrated with AKI on prior admission and this admission fluid overloaded. Responding well to diuretics. Bilateral pleural effusion on CT.  Acute HFrEF exacerbation  AKI on CKDIIIb- Cr improved today from 2.9>2.37 - continue with lasix  80 mg iv bid as per renal rec - nephrology following, appreciate recs. No urgent HD at this time. - daily weights - strict I/O    Hyperkalemia- resolved with treatment as above.  - RFP in the am.    B/L PNA POA -s/p course with unasyn  and zithromax  on most recent admit. Low suspicion for recurrent infection. Showing pulmonary congestion. Monitor  for fever if needing to start infectious treatment. Currently on 2L  and improving    Hx of Afib -resume metoprolol  -continue on eliquis      Recent CVA  - continue on secondary ppx  with eliquis    Subclinical hypothyroidism POA - plan for repeat  TSH in six weeks    rBRCA-metastatic. Chest/abd/pelvis on admission showed metastatic disease.  -follows with dr Deanne.   HLD -resume home regimen    HTN- significantly elevated. Hopefully will improve with diuresis. May need to increase home regimen if further options available given renal function.  -continue home regimen  Body mass index is 17.18 kg/m.  VTE ppx: apixaban  (ELIQUIS ) tablet 2.5 mg Start: 05/03/24 2315 apixaban  (ELIQUIS ) tablet 2.5 mg   Diet:     Diet   Diet Heart Room service appropriate? Yes; Fluid consistency: Thin   Consultants: Nephrology   Subjective 05/04/24    Pt reports feeling much improved. Her leg swelling is down and breathing is more comfortable.    Objective  Blood pressure (!) 166/71, pulse 85, temperature 98.4 F (36.9 C), temperature source Oral, resp. rate (!) 21, height 5' 8 (1.727 m), weight 51.3 kg, last menstrual period 10/07/1974, SpO2 97%.  Intake/Output Summary (Last 24 hours) at 05/04/2024 0713 Last data filed at 05/04/2024 0534 Gross per 24 hour  Intake 50 ml  Output 1740 ml  Net -1690 ml   Filed Weights   05/03/24 1723  Weight: 51.3 kg     Physical Exam:  General: awake, alert, NAD.  Frail appearing HEENT: atraumatic, clear conjunctiva, anicteric sclera, MMM, hearing grossly normal Respiratory: normal respiratory effort. Frequent wet cough without production. Rales throughout, no wheezing. On 2L Cardiovascular: quick capillary refill, normal S1/S2, RRR, no JVD, murmurs Gastrointestinal: soft, NT, ND Nervous: A&O x3. no gross focal neurologic deficits, normal speech Extremities: moves all equally, pitting edema to thighs Skin: dry, intact, normal temperature, normal  color. No rashes, lesions or ulcers on exposed skin Psychiatry: normal mood, congruent affect  Labs   I have personally reviewed the following labs and imaging studies CBC    Component Value Date/Time   WBC 16.1 (H) 05/03/2024 1840   RBC 3.20 (L) 05/03/2024 1840   HGB 9.9 (L) 05/03/2024 1849   HGB 9.2 (L) 04/22/2024 1041   HGB 8.8 (L) 02/19/2008 1135   HCT 29.0 (L) 05/03/2024 1849   HCT 26.0 (L) 02/19/2008 1135   PLT 325 05/03/2024 1840   PLT 228 04/22/2024 1041   PLT 244 02/19/2008 1135   MCV 93.8 05/03/2024 1840   MCV 89.0 02/19/2008 1135   MCH 29.4 05/03/2024 1840   MCHC 31.3 05/03/2024 1840   RDW 15.9 (H) 05/03/2024 1840   RDW 22.9 (H) 02/19/2008 1135   LYMPHSABS 1.0 05/03/2024 1840   LYMPHSABS 0.3 (L) 02/19/2008 1135   MONOABS 1.0 05/03/2024 1840   MONOABS 0.9 02/19/2008 1135   EOSABS 0.1 05/03/2024 1840   EOSABS 0.1 02/19/2008 1135   BASOSABS 0.1 05/03/2024 1840   BASOSABS 0.0 02/19/2008 1135      Latest Ref Rng & Units 05/03/2024    6:49 PM 05/03/2024    6:40 PM 04/30/2024    4:17 AM  BMP  Glucose 70 - 99 mg/dL 887  890  849   BUN 8 - 23 mg/dL 35  35  32   Creatinine 0.44 - 1.00 mg/dL 7.09  7.33  7.99   Sodium 135 - 145 mmol/L 138  137  138   Potassium 3.5 - 5.1 mmol/L 5.8  5.8  3.5   Chloride 98 - 111 mmol/L 105  103  103   CO2 22 - 32 mmol/L  23  24   Calcium  8.9 - 10.3 mg/dL  8.8  8.5     CT CHEST ABDOMEN PELVIS WO CONTRAST Result Date: 05/03/2024 CLINICAL DATA:  Shortness of breath EXAM: CT CHEST, ABDOMEN AND PELVIS WITHOUT CONTRAST TECHNIQUE: Multidetector CT imaging of the chest, abdomen and pelvis was performed following the standard protocol without IV contrast. RADIATION DOSE REDUCTION: This exam was performed according to the departmental dose-optimization program which includes automated exposure control, adjustment of the mA and/or kV according to patient size and/or use of iterative reconstruction technique. COMPARISON:  Chest x-ray from earlier in  the same day. FINDINGS: CT CHEST FINDINGS Cardiovascular: Somewhat limited due to lack of IV contrast. Diffuse atherosclerotic calcifications of the aorta are noted without aneurysmal dilatation. Heart is not significantly enlarged in size. Coronary calcifications are noted. Mediastinum/Nodes: The thoracic inlet is within normal limits. No hilar or mediastinal adenopathy is noted. The esophagus as visualized is within normal limits. Lungs/Pleura: Lungs are well aerated bilaterally. Bilateral pleural effusions are noted right greater than left of a large size. Underlying lower lobe consolidation is noted bilateral with some inspissation of material within the bronchial tree. This has increased overall in the interval from prior exam. The previously described patchy nodular changes in left lower lobe along the fissure have resolved in the interval. Peri fissural nodule is noted on the right along the  major fissure best seen on image number 47 of series 5. This has decreased significantly in the interval from a prior exam in January of 2005 consistent with Peri fissural lymph node. Few scattered small less than 5 mm nodules are again noted. Musculoskeletal: Persistent sclerotic lesions within the bony structures consistent with healed metastatic disease. T11 vertebral plana is again noted and stable. CT ABDOMEN PELVIS FINDINGS Hepatobiliary: Gallbladder has been surgically removed. The known hepatic metastatic disease is again not well appreciated on the noncontrast study. Some suggestion of a hypodense lesion in the posterior aspect of the right lobe of the liver is noted. Pancreas: Pancreas is within normal limits. Spleen: Normal in size without focal abnormality. Adrenals/Urinary Tract: Adrenal glands are within normal limits. The kidneys demonstrate nonobstructing renal calculi bilaterally. The overall appearance is stable from the prior exam. No obstructive changes are seen. The bladder is well distended.  Stomach/Bowel: Fecal material is noted within the colon without obstructive change. This is similar to that seen on the prior study. The appendix has been surgically removed. Small bowel and stomach appear within normal limits. Vascular/Lymphatic: Aortic atherosclerosis. No enlarged abdominal or pelvic lymph nodes. Reproductive: Status post hysterectomy. No adnexal masses. Other: No abdominal wall hernia or abnormality. No abdominopelvic ascites. Musculoskeletal: Healed metastatic disease is again noted in the lower thoracic spine. Degenerative anterolisthesis of L5 on S1 is noted. Stable metastatic disease in the L4 spinous process is seen. IMPRESSION: CT of the chest: Bilateral pleural effusions slightly increased when compared with the prior exam. Persistent lower lobe consolidation with inspissated material within the lower lobe bronchi. Some interval clearing of the previously described acute infiltrative density in the lower lobe is noted. Scattered small nodules are again seen and stable. No new sizable nodule is seen. Healed metastatic disease. CT of the abdomen and pelvis: Hypodense lesion within the liver incompletely evaluated due to lack of IV contrast consistent with the known history of hepatic metastatic disease. This is better visualized than on the most recent exam. Scattered fecal material throughout the colon without obstructive change. Healed metastatic disease similar to that noted on the prior exam. Nonobstructing renal calculi bilaterally stable from the prior study. Electronically Signed   By: Oneil Devonshire M.D.   On: 05/03/2024 23:45   DG Chest Port 1 View Result Date: 05/03/2024 CLINICAL DATA:  Shortness of breath and cough. EXAM: PORTABLE CHEST 1 VIEW COMPARISON:  April 27, 2024 FINDINGS: The left-sided venous catheter seen on the prior study has been removed. The cardiac silhouette is mildly enlarged and unchanged in size. There is marked severity calcification of the thoracic aorta. Low  lung volumes are seen with subsequent crowding of the bronchovascular lung markings. All mild to moderate severity areas of atelectasis and/or infiltrate are seen within the bilateral lung bases. There are small bilateral pleural effusions. No pneumothorax is identified. Multilevel degenerative changes are seen throughout the thoracic spine. IMPRESSION: 1. Stable cardiomegaly with mild to moderate severity bibasilar atelectasis and/or infiltrate. 2. Small bilateral pleural effusions. Electronically Signed   By: Suzen Dials M.D.   On: 05/03/2024 19:37    Disposition Plan & Communication  Patient status: Inpatient  Admitted From: Home Planned disposition location: Assisted living Anticipated discharge date: 7/31 pending clinical improvement   Family Communication: daughter at bedside    Author: Marien LITTIE Piety, DO [facility] 05/04/2024, 7:13 AM   Available by Epic secure chat 7AM-7PM. If 7PM-7AM, please contact night-coverage.  [facility] contact information found on [email or web address].

## 2024-05-04 NOTE — Telephone Encounter (Signed)
 Daughter, Elveria Novak called to report that Ashauna has been admitted to hospital again on 05/03/24 with pretty much same issues. K+ high and SOB. Asking to speak with Dr. Cloretta at [phone]

## 2024-05-04 NOTE — ED Notes (Signed)
 Nt called ccmd@7 :24am

## 2024-05-04 NOTE — Progress Notes (Signed)
 [facility] NEPHROLOGY PROGRESS NOTE  Pt is a 89 y.o. yo female with past medical history significant for CHF, stroke, A-fib with RVR, CKD IIIb-IV, recent hospitalization for AKI on CKD, CHF exacerbation presented back today with worsening shortness of breath.  Chest x-ray with bilateral pleural effusion and atelectasis.  The patient was recently hospitalized for hyperkalemia and AKI on CKD required brief period of dialysis.  The HD catheter was removed.  She was discharged with oral diuretics with outpatient follow-up.  During hospitalization she had thoracocentesis for pleural effusion, treated with antibiotics for pneumonia and was seen by oncologist.  discharged on 7/25.  Daughter bedside  Assessment/ Plan:  # Acute on chronic hypoxic respiratory failure with evidence of fluid overload: Oxygenation is acceptable.  Responding to IV Lasix    # AKI on CKD versus progressive CKD 4: Renal function declining with ongoing CHF, A-fib with RVR; fortunately seems to be responding to IV diuresis and will continue; Lasix  80mg  IV BID.  # Hyperkalemia: Managed medically including diuretics; resolved.  # Acute on chronic CHF with preserved EF: Evidence of fluid overload, diuretics as discussed above.   Subjective: Seen and examined.  Patient reported that breathing is somewhat better. Denies nausea, vomiting, chest pain.  Daughter at the bedside.  Objective Vital signs in last 24 hours: Vitals:   05/04/24 0733 05/04/24 0735 05/04/24 1000 05/04/24 1001  BP:  (!) 167/84 (!) 168/69 (!) 167/84  Pulse:  85 75 84  Resp:  (!) 24 19   Temp:  98.8 F (37.1 C)    TempSrc:  Oral    SpO2: 95% 100% 96%   Weight:      Height:       Weight change:   Intake/Output Summary (Last 24 hours) at 05/04/2024 1041 Last data filed at 05/04/2024 0534 Gross per 24 hour  Intake 50 ml  Output 1740 ml  Net -1690 ml    Labs: RENAL PANEL Recent Labs  Lab 04/28/24 0339 04/29/24 0347 04/30/24 0417  05/03/24 1840 05/03/24 1849 05/04/24 0741  NA 137 136 138 137 138 135  K 4.7 4.1 3.5 5.8* 5.8* 4.1  CL 102 102 103 103 105 100  CO2 26 23 24 23   --  22  GLUCOSE 111* 122* 150* 109* 112* 75  BUN 32* 33* 32* 35* 35* 35*  CREATININE 1.99* 2.06* 2.00* 2.66* 2.90* 2.37*  CALCIUM  8.2* 8.1* 8.5* 8.8*  --  9.9  MG 2.3  --   --  2.1  --   --   PHOS  --   --   --   --   --  4.6  ALBUMIN   --   --   --  2.8*  --  2.8*    Liver Function Tests: Recent Labs  Lab 05/03/24 1840 05/04/24 0741  AST 216*  --   ALT 169*  --   ALKPHOS 254*  --   BILITOT 0.8  --   PROT 6.1*  --   ALBUMIN  2.8* 2.8*   No results for input(s): LIPASE, AMYLASE in the last 168 hours. No results for input(s): AMMONIA in the last 168 hours. CBC: Recent Labs    12/24/23 0231 12/25/23 0312 04/23/24 0416 04/25/24 0308 05/03/24 1840 05/03/24 1849 05/04/24 0741  HGB 7.4*   < > 9.1* 9.2* 9.4* 9.9* 9.4*  MCV 104.0*   < > 89.1 91.3 93.8  --  92.9  VITAMINB12 1,125*  --   --   --   --   --   --  FOLATE 28.0  --   --   --   --   --   --   FERRITIN 323*  --   --   --   --   --   --   TIBC 225*  --   --   --   --   --   --   IRON 66  --   --   --   --   --   --    < > = values in this interval not displayed.    Cardiac Enzymes: No results for input(s): CKTOTAL, CKMB, CKMBINDEX, TROPONINI in the last 168 hours. CBG: Recent Labs  Lab 04/28/24 0800 04/28/24 1220 04/28/24 1613 04/29/24 0738  GLUCAP 109* 173* 216* 147*    Iron Studies: No results for input(s): IRON, TIBC, TRANSFERRIN, FERRITIN in the last 72 hours. Studies/Results: CT CHEST ABDOMEN PELVIS WO CONTRAST Result Date: 05/03/2024 CLINICAL DATA:  Shortness of breath EXAM: CT CHEST, ABDOMEN AND PELVIS WITHOUT CONTRAST TECHNIQUE: Multidetector CT imaging of the chest, abdomen and pelvis was performed following the standard protocol without IV contrast. RADIATION DOSE REDUCTION: This exam was performed according to the departmental  dose-optimization program which includes automated exposure control, adjustment of the mA and/or kV according to patient size and/or use of iterative reconstruction technique. COMPARISON:  Chest x-ray from earlier in the same day. FINDINGS: CT CHEST FINDINGS Cardiovascular: Somewhat limited due to lack of IV contrast. Diffuse atherosclerotic calcifications of the aorta are noted without aneurysmal dilatation. Heart is not significantly enlarged in size. Coronary calcifications are noted. Mediastinum/Nodes: The thoracic inlet is within normal limits. No hilar or mediastinal adenopathy is noted. The esophagus as visualized is within normal limits. Lungs/Pleura: Lungs are well aerated bilaterally. Bilateral pleural effusions are noted right greater than left of a large size. Underlying lower lobe consolidation is noted bilateral with some inspissation of material within the bronchial tree. This has increased overall in the interval from prior exam. The previously described patchy nodular changes in left lower lobe along the fissure have resolved in the interval. Peri fissural nodule is noted on the right along the major fissure best seen on image number 47 of series 5. This has decreased significantly in the interval from a prior exam in January of 2005 consistent with Peri fissural lymph node. Few scattered small less than 5 mm nodules are again noted. Musculoskeletal: Persistent sclerotic lesions within the bony structures consistent with healed metastatic disease. T11 vertebral plana is again noted and stable. CT ABDOMEN PELVIS FINDINGS Hepatobiliary: Gallbladder has been surgically removed. The known hepatic metastatic disease is again not well appreciated on the noncontrast study. Some suggestion of a hypodense lesion in the posterior aspect of the right lobe of the liver is noted. Pancreas: Pancreas is within normal limits. Spleen: Normal in size without focal abnormality. Adrenals/Urinary Tract: Adrenal glands  are within normal limits. The kidneys demonstrate nonobstructing renal calculi bilaterally. The overall appearance is stable from the prior exam. No obstructive changes are seen. The bladder is well distended. Stomach/Bowel: Fecal material is noted within the colon without obstructive change. This is similar to that seen on the prior study. The appendix has been surgically removed. Small bowel and stomach appear within normal limits. Vascular/Lymphatic: Aortic atherosclerosis. No enlarged abdominal or pelvic lymph nodes. Reproductive: Status post hysterectomy. No adnexal masses. Other: No abdominal wall hernia or abnormality. No abdominopelvic ascites. Musculoskeletal: Healed metastatic disease is again noted in the lower thoracic spine. Degenerative anterolisthesis of  L5 on S1 is noted. Stable metastatic disease in the L4 spinous process is seen. IMPRESSION: CT of the chest: Bilateral pleural effusions slightly increased when compared with the prior exam. Persistent lower lobe consolidation with inspissated material within the lower lobe bronchi. Some interval clearing of the previously described acute infiltrative density in the lower lobe is noted. Scattered small nodules are again seen and stable. No new sizable nodule is seen. Healed metastatic disease. CT of the abdomen and pelvis: Hypodense lesion within the liver incompletely evaluated due to lack of IV contrast consistent with the known history of hepatic metastatic disease. This is better visualized than on the most recent exam. Scattered fecal material throughout the colon without obstructive change. Healed metastatic disease similar to that noted on the prior exam. Nonobstructing renal calculi bilaterally stable from the prior study. Electronically Signed   By: Oneil Devonshire M.D.   On: 05/03/2024 23:45   DG Chest Port 1 View Result Date: 05/03/2024 CLINICAL DATA:  Shortness of breath and cough. EXAM: PORTABLE CHEST 1 VIEW COMPARISON:  April 27, 2024  FINDINGS: The left-sided venous catheter seen on the prior study has been removed. The cardiac silhouette is mildly enlarged and unchanged in size. There is marked severity calcification of the thoracic aorta. Low lung volumes are seen with subsequent crowding of the bronchovascular lung markings. All mild to moderate severity areas of atelectasis and/or infiltrate are seen within the bilateral lung bases. There are small bilateral pleural effusions. No pneumothorax is identified. Multilevel degenerative changes are seen throughout the thoracic spine. IMPRESSION: 1. Stable cardiomegaly with mild to moderate severity bibasilar atelectasis and/or infiltrate. 2. Small bilateral pleural effusions. Electronically Signed   By: Suzen Dials M.D.   On: 05/03/2024 19:37    Medications: Infusions:    Scheduled Medications:  apixaban   2.5 mg Oral BID   cyanocobalamin   1,000 mcg Oral Daily   furosemide   80 mg Intravenous Q12H   hydrALAZINE   25 mg Oral Q8H   hydroxychloroquine   200 mg Oral Daily   isosorbide  mononitrate  60 mg Oral Daily   metoprolol  tartrate  100 mg Oral BID   pantoprazole   20 mg Oral Daily   rosuvastatin   5 mg Oral QHS   sodium bicarbonate   650 mg Oral BID    have reviewed scheduled and prn medications.  Physical Exam: General: Ill-looking female. Heart:RRR, s1s2 nl Lungs: Better reduced breath sound, mild increased work of breathing Abdomen:soft, Non-tender, non-distended Extremities: Trace no edema Dialysis Access: Removed  [name] W 05/04/2024,10:41 AM  LOS: 1 day

## 2024-05-04 NOTE — Progress Notes (Signed)
 Heart Failure Navigator Progress Note  Assessed for Heart & Vascular TOC clinic readiness.  Patient does not meet criteria due to per MD note history of Metastatic cancer. No HF TOC. .   Navigator will sign off at this time.   Randie Bustle, BSN, Scientist, clinical (histocompatibility and immunogenetics) Only

## 2024-05-04 NOTE — Telephone Encounter (Signed)
 Called daughter to inquire what issues she needs to discuss w/him. She reports that palliative team spoke with her and are suggesting full [facility]. [name] can't decide what to do because she will not be able to receive her [employer]  if in hospice care. Hopes Dr. Cloretta will discuss this with [name] if he sees her tomorrow in hospital ([facility]) to help her make a decision. Daughter thinks full hospice would be best, but wants [name] to make that decision. She plans to return to assisted living on discharge.

## 2024-05-05 ENCOUNTER — Telehealth: Payer: Self-pay | Admitting: *Deleted

## 2024-05-05 DIAGNOSIS — D649 Anemia, unspecified: Secondary | ICD-10-CM | POA: Diagnosis not present

## 2024-05-05 DIAGNOSIS — N179 Acute kidney failure, unspecified: Secondary | ICD-10-CM | POA: Diagnosis not present

## 2024-05-05 DIAGNOSIS — E8779 Other fluid overload: Secondary | ICD-10-CM | POA: Diagnosis not present

## 2024-05-05 DIAGNOSIS — E8770 Fluid overload, unspecified: Secondary | ICD-10-CM | POA: Diagnosis not present

## 2024-05-05 DIAGNOSIS — J9621 Acute and chronic respiratory failure with hypoxia: Secondary | ICD-10-CM | POA: Diagnosis not present

## 2024-05-05 DIAGNOSIS — E875 Hyperkalemia: Secondary | ICD-10-CM | POA: Diagnosis not present

## 2024-05-05 DIAGNOSIS — N184 Chronic kidney disease, stage 4 (severe): Secondary | ICD-10-CM | POA: Diagnosis not present

## 2024-05-05 DIAGNOSIS — I130 Hypertensive heart and chronic kidney disease with heart failure and stage 1 through stage 4 chronic kidney disease, or unspecified chronic kidney disease: Secondary | ICD-10-CM | POA: Diagnosis not present

## 2024-05-05 DIAGNOSIS — I509 Heart failure, unspecified: Secondary | ICD-10-CM | POA: Diagnosis not present

## 2024-05-05 LAB — RENAL FUNCTION PANEL
Albumin: 2.3 g/dL — ABNORMAL LOW (ref 3.5–5.0)
Anion gap: 9 (ref 5–15)
BUN: 36 mg/dL — ABNORMAL HIGH (ref 8–23)
CO2: 29 mmol/L (ref 22–32)
Calcium: 8.5 mg/dL — ABNORMAL LOW (ref 8.9–10.3)
Chloride: 101 mmol/L (ref 98–111)
Creatinine, Ser: 2.19 mg/dL — ABNORMAL HIGH (ref 0.44–1.00)
GFR, Estimated: 21 mL/min — ABNORMAL LOW (ref >60)
Glucose, Bld: 122 mg/dL — ABNORMAL HIGH (ref 70–99)
Phosphorus: 4.3 mg/dL (ref 2.5–4.6)
Potassium: 3.2 mmol/L — ABNORMAL LOW (ref 3.5–5.1)
Sodium: 139 mmol/L (ref 135–145)

## 2024-05-05 LAB — CBC
HCT: 27.6 % — ABNORMAL LOW (ref 36.0–46.0)
Hemoglobin: 8.9 g/dL — ABNORMAL LOW (ref 12.0–15.0)
MCH: 29.2 pg (ref 26.0–34.0)
MCHC: 32.2 g/dL (ref 30.0–36.0)
MCV: 90.5 fL (ref 80.0–100.0)
Platelets: 216 K/uL (ref 150–400)
RBC: 3.05 MIL/uL — ABNORMAL LOW (ref 3.87–5.11)
RDW: 15.9 % — ABNORMAL HIGH (ref 11.5–15.5)
WBC: 14.5 K/uL — ABNORMAL HIGH (ref 4.0–10.5)
nRBC: 0 % (ref 0.0–0.2)

## 2024-05-05 MED ORDER — POTASSIUM CHLORIDE CRYS ER 20 MEQ PO TBCR
40.0000 meq | EXTENDED_RELEASE_TABLET | Freq: Once | ORAL | Status: AC
  Administered 2024-05-05: 40 meq via ORAL
  Filled 2024-05-05: qty 2

## 2024-05-05 MED ORDER — TORSEMIDE 40 MG PO TABS: 40.0000 mg | ORAL_TABLET | Freq: Every day | ORAL | 0 refills | Status: AC

## 2024-05-05 MED ORDER — HYDRALAZINE HCL 50 MG PO TABS: 50.0000 mg | ORAL_TABLET | Freq: Three times a day (TID) | ORAL | Status: DC

## 2024-05-05 NOTE — TOC Transition Note (Signed)
 Transition of Care [facility]) - Discharge Note   Patient Details  Name: [name] MRN: [id number] Date of Birth: 31-Aug-1934  [facility]) CM/SW Contact:  Waddell Barnie Rama, RN Phone Number: 05/05/2024, 1:49 PM   Clinical Narrative:    Per CSW patient is for dc home, NCM spoke with patient and daughter at the bedside, they have no needs. The plan is that they are working on going to ALF  per CSW note.     Barriers to Discharge: Other (must enter comment) (Awaiting to hear back form ALF)   Patient Goals and CMS Choice            Discharge Placement                       Discharge Plan and Services Additional resources added to the After Visit Summary for   In-house Referral: NA Discharge Planning Services: CM Consult              DME Agency: NA                  Social Drivers of Health (SDOH) Interventions SDOH Screenings   Food Insecurity: No Food Insecurity (05/05/2024)  Housing: Low Risk  (05/05/2024)  Transportation Needs: No Transportation Needs (05/05/2024)  Utilities: Not At Risk (05/05/2024)  Depression (PHQ2-9): Low Risk  (04/22/2024)  Social Connections: Unknown (05/05/2024)  Recent Concern: Social Connections - Moderately Isolated (04/09/2024)  Tobacco Use: Low Risk  (05/04/2024)     Readmission Risk Interventions    04/12/2024   11:59 AM 12/09/2023   12:21 PM 11/23/2023   11:56 AM  Readmission Risk Prevention Plan  Transportation Screening Complete Complete Complete  PCP or Specialist Appt within 5-7 Days   Complete  Home Care Screening   Complete  Medication Review (RN CM)   Complete  Medication Review (RN Care Manager) Referral to Pharmacy    HRI or Home Care Consult Complete    SW Recovery Care/Counseling Consult Complete    Palliative Care Screening Not Applicable    Skilled Nursing Facility Not Applicable Complete

## 2024-05-05 NOTE — Discharge Summary (Signed)
 Physician Discharge Summary  Patient: [name] FMW:[id number] DOB: 01-05-1934   Code Status: Do not attempt resuscitation (DNR) PRE-ARREST INTERVENTIONS DESIRED Admit date: 05/03/2024 Discharge date: 05/05/2024 Disposition: Home, OT PCP: Elliot Charm, MD  Recommendations for Outpatient Follow-up:  Follow up with PCP within 1-2 weeks Regarding general hospital follow up and preventative care Recommend BMP to monitor renal function, reassess dry weight Follow up with cardiology Follow up with nephrology   Discharge Diagnoses:  Principal Problem:   Hyperkalemia  Brief Hospital Course Summary: [name] is a 89 y.o. female with medical history significant for BRCA, HTN, HLD, CAD, GERD, CHFpef, stroke, A-fib, CKD IIIb-IV, recent hospitalization for AKI on CKD and CHF exacerbation as well as CVA c/b severe hyperkalemia for which patient received HD briefly. Patient with discharge 04/30/24. Patient now returns with complaints of sob and fluid overload.    ED Course: EKG: nsr, Vitals: 97.2, bp 141/51, hr 48, rr 20. Required up to 2Lnc to maintain sats.  RVP: neg, Wbc 16.1, hgb 9.4,  plt 325, Na 137,K 5.8, cl 103, glu 109, cr 2.66  (2) AST 216, ALT 169, Alphos 254, BNP 859.6   CXR: 1. Stable cardiomegaly with mild to moderate severity bibasilar atelectasis and/or infiltrate. 2. Small bilateral pleural effusions.   Tx Na bicarb, calcium  gluconate , lasix , lokelma . Nephrology was consulted.   She responded well to IV lasix , was able to wean from oxygen even with ambulation and leg swelling almost resolved. Her kidneys tolerated well. Lost about 4.5lbs through diuresis.  K+ returned to normal range.  PT/OT evaluated and recommended no PT needs but will have OT outpatient.  She will be moving into ALF tomorrow. Quantiferon gold ordered and pending at time of dc.   Her dc instructions:  Your weight today is 108.9 pounds and have just a small amount of swelling at the  ankles. Please stay active and raise your legs up when resting. I also recommend compression stockings up to at least the knee to help prevent swelling.  If you weigh yourself in the morning and see that your weight is above 112, please take an additional dose of the torsemide  around 2pm that day in addition to your early morning dose.  Please follow up with your PCP and/or kidney doctor within 1-2 week to recheck your kidney function and adjust those medications as needed.   All other chronic conditions were treated with home medications.    Discharge Condition: Good, improved Recommended discharge diet: Regular healthy diet  Consultations: Nephrology- signed off 7/30  Procedures/Studies: None   Allergies as of 05/05/2024       Reactions   Ramipril Itching        Medication List     TAKE these medications    Belsomra 10 MG Tabs Generic drug: Suvorexant Take 10 mg by mouth at bedtime.   CALTRATE 600+D PO Take 1 tablet by mouth 2 (two) times daily.   cyanocobalamin  1000 MCG tablet Commonly known as: VITAMIN B12 Take 1,000 mcg by mouth daily.   DECUBI-VITE PO Take 1 capsule by mouth daily at 6 (six) AM. For wound healing   diltiazem  360 MG 24 hr capsule Commonly known as: TIAZAC  Take 360 mg by mouth daily.   Eliquis  2.5 MG Tabs tablet Generic drug: apixaban  Take 1 tablet (2.5 mg total) by mouth 2 (two) times daily.   hydrALAZINE  25 MG tablet Commonly known as: APRESOLINE  Take 1 tablet (25 mg total) by mouth every 8 (  eight) hours.   hydroxychloroquine  200 MG tablet Commonly known as: PLAQUENIL  Take 200 mg by mouth daily.   isosorbide  mononitrate 60 MG 24 hr tablet Commonly known as: IMDUR  Take 1 tablet (60 mg total) by mouth daily.   magnesium  oxide 400 MG tablet Commonly known as: MAG-OX Take 2 tablets (800 mg total) by mouth 2 (two) times daily. What changed: how much to take   metoprolol  tartrate 100 MG tablet Commonly known as: LOPRESSOR  Take 1  tablet (100 mg total) by mouth 2 (two) times daily.   oxyCODONE  5 MG immediate release tablet Commonly known as: Oxy IR/ROXICODONE  Take 1 tablet (5 mg total) by mouth every 6 (six) hours as needed.   pantoprazole  20 MG tablet Commonly known as: PROTONIX  Take 1 tablet (20 mg total) by mouth daily.   Potassium Chloride  ER 20 MEQ Tbcr Take 1 tablet by mouth 2 (two) times daily.   rosuvastatin  10 MG tablet Commonly known as: CRESTOR  Take 0.5 tablets (5 mg total) by mouth at bedtime.   sodium bicarbonate  650 MG tablet Take 1 tablet (650 mg total) by mouth 2 (two) times daily.   tiZANidine  4 MG tablet Commonly known as: ZANAFLEX  Take 4 mg by mouth 2 (two) times daily as needed for muscle spasms.   Torsemide  40 MG Tabs Take 40 mg by mouth daily. What changed:  medication strength how much to take   Triple Antibiotic Oint Apply 1 Application topically daily as needed (skin irritations). Apply to skin tears daily as needed.   Vitamin D3 50 MCG (2000 UT) Tabs Take 2,000 Units by mouth in the morning and at bedtime.        Follow-up Information     Elliot Charm, MD. Schedule an appointment as soon as possible for a visit in 1 week(s).   Specialty: Internal Medicine Contact information: [address] [place] [address] [phone]                 Subjective   Pt reports feeling well today. She has no SOB at rest. Still has some mild cough but much improved.   All questions and concerns were addressed at time of discharge.  Objective  Blood pressure (!) 151/59, pulse 96, temperature 98 F (36.7 C), temperature source Oral, resp. rate 17, height 5' 8 (1.727 m), weight 49.4 kg, last menstrual period 10/07/1974, SpO2 97%.   General: Pt is alert, awake, not in acute distress Cardiovascular: RRR, S1/S2 +, no rubs, no gallops Respiratory: CTA bilaterally, no wheezing, no rhonchi Abdominal: Soft, NT, ND, bowel sounds + Extremities: trace  pitting edema at sock line bilaterally, no cyanosis  The results of significant diagnostics from this hospitalization (including imaging, microbiology, ancillary and laboratory) are listed below for reference.   Imaging studies: CT CHEST ABDOMEN PELVIS WO CONTRAST Result Date: 05/03/2024 CLINICAL DATA:  Shortness of breath EXAM: CT CHEST, ABDOMEN AND PELVIS WITHOUT CONTRAST TECHNIQUE: Multidetector CT imaging of the chest, abdomen and pelvis was performed following the standard protocol without IV contrast. RADIATION DOSE REDUCTION: This exam was performed according to the departmental dose-optimization program which includes automated exposure control, adjustment of the mA and/or kV according to patient size and/or use of iterative reconstruction technique. COMPARISON:  Chest x-ray from earlier in the same day. FINDINGS: CT CHEST FINDINGS Cardiovascular: Somewhat limited due to lack of IV contrast. Diffuse atherosclerotic calcifications of the aorta are noted without aneurysmal dilatation. Heart is not significantly enlarged in size. Coronary calcifications are noted.  Mediastinum/Nodes: The thoracic inlet is within normal limits. No hilar or mediastinal adenopathy is noted. The esophagus as visualized is within normal limits. Lungs/Pleura: Lungs are well aerated bilaterally. Bilateral pleural effusions are noted right greater than left of a large size. Underlying lower lobe consolidation is noted bilateral with some inspissation of material within the bronchial tree. This has increased overall in the interval from prior exam. The previously described patchy nodular changes in left lower lobe along the fissure have resolved in the interval. Peri fissural nodule is noted on the right along the major fissure best seen on image number 47 of series 5. This has decreased significantly in the interval from a prior exam in January of 2005 consistent with Peri fissural lymph node. Few scattered small less than 5 mm  nodules are again noted. Musculoskeletal: Persistent sclerotic lesions within the bony structures consistent with healed metastatic disease. T11 vertebral plana is again noted and stable. CT ABDOMEN PELVIS FINDINGS Hepatobiliary: Gallbladder has been surgically removed. The known hepatic metastatic disease is again not well appreciated on the noncontrast study. Some suggestion of a hypodense lesion in the posterior aspect of the right lobe of the liver is noted. Pancreas: Pancreas is within normal limits. Spleen: Normal in size without focal abnormality. Adrenals/Urinary Tract: Adrenal glands are within normal limits. The kidneys demonstrate nonobstructing renal calculi bilaterally. The overall appearance is stable from the prior exam. No obstructive changes are seen. The bladder is well distended. Stomach/Bowel: Fecal material is noted within the colon without obstructive change. This is similar to that seen on the prior study. The appendix has been surgically removed. Small bowel and stomach appear within normal limits. Vascular/Lymphatic: Aortic atherosclerosis. No enlarged abdominal or pelvic lymph nodes. Reproductive: Status post hysterectomy. No adnexal masses. Other: No abdominal wall hernia or abnormality. No abdominopelvic ascites. Musculoskeletal: Healed metastatic disease is again noted in the lower thoracic spine. Degenerative anterolisthesis of L5 on S1 is noted. Stable metastatic disease in the L4 spinous process is seen. IMPRESSION: CT of the chest: Bilateral pleural effusions slightly increased when compared with the prior exam. Persistent lower lobe consolidation with inspissated material within the lower lobe bronchi. Some interval clearing of the previously described acute infiltrative density in the lower lobe is noted. Scattered small nodules are again seen and stable. No new sizable nodule is seen. Healed metastatic disease. CT of the abdomen and pelvis: Hypodense lesion within the liver  incompletely evaluated due to lack of IV contrast consistent with the known history of hepatic metastatic disease. This is better visualized than on the most recent exam. Scattered fecal material throughout the colon without obstructive change. Healed metastatic disease similar to that noted on the prior exam. Nonobstructing renal calculi bilaterally stable from the prior study. Electronically Signed   By: Oneil Devonshire M.D.   On: 05/03/2024 23:45   DG Chest Port 1 View Result Date: 05/03/2024 CLINICAL DATA:  Shortness of breath and cough. EXAM: PORTABLE CHEST 1 VIEW COMPARISON:  April 27, 2024 FINDINGS: The left-sided venous catheter seen on the prior study has been removed. The cardiac silhouette is mildly enlarged and unchanged in size. There is marked severity calcification of the thoracic aorta. Low lung volumes are seen with subsequent crowding of the bronchovascular lung markings. All mild to moderate severity areas of atelectasis and/or infiltrate are seen within the bilateral lung bases. There are small bilateral pleural effusions. No pneumothorax is identified. Multilevel degenerative changes are seen throughout the thoracic spine. IMPRESSION: 1. Stable cardiomegaly with  mild to moderate severity bibasilar atelectasis and/or infiltrate. 2. Small bilateral pleural effusions. Electronically Signed   By: Suzen Dials M.D.   On: 05/03/2024 19:37   DG Chest 2 View Result Date: 04/27/2024 CLINICAL DATA:  Shortness of breath. EXAM: CHEST - 2 VIEW COMPARISON:  April 24, 2024. FINDINGS: The heart size and mediastinal contours are within normal limits. Mild central pulmonary vascular congestion is again noted with minimal bibasilar subsegmental atelectasis or edema and small pleural effusions. Left internal jugular dialysis catheter is unchanged. Old lower thoracic compression fracture is noted. IMPRESSION: Mild central pulmonary vascular congestion with minimal bibasilar subsegmental atelectasis or edema  and small pleural effusions. Aortic Atherosclerosis (ICD10-I70.0). Electronically Signed   By: Lynwood Landy Raddle M.D.   On: 04/27/2024 13:40   VAS US  CAROTID (at [facility] and [facility] only) Result Date: 04/26/2024 Carotid Arterial Duplex Study Patient Name:  SHEKELA GOODRIDGE  Date of Exam:   04/25/2024 Medical Rec #: [id number]         Accession #:    [id number] Date of Birth: [birth year]        Patient Gender: F Patient Age:   17 years Exam Location:  [facility] Procedure:      VAS US  CAROTID Referring Phys: EARLE DE LA TORRE --------------------------------------------------------------------------------  Indications:       Carotid artery disease and acute onset right arm and leg                    weakness. Risk Factors:      Hypertension, hyperlipidemia, coronary artery disease. Other Factors:     CKD stage IIIb, metastatic breast cancer, ovarian cancer,                    A-fib. Limitations        Today's exam was limited due to Line and bandages in left                    neck. Comparison Study:  Prior carotid duplex done 03/14/2022 indicating bilateral 1-39%                    ICA stenosis. Performing Technologist: Eleanor Lincoln  Examination Guidelines: A complete evaluation includes B-mode imaging, spectral Doppler, color Doppler, and power Doppler as needed of all accessible portions of each vessel. Bilateral testing is considered an integral part of a complete examination. Limited examinations for reoccurring indications may be performed as noted.  Right Carotid Findings: +----------+--------+--------+--------+------------------+------------------+           PSV cm/sEDV cm/sStenosisPlaque DescriptionComments           +----------+--------+--------+--------+------------------+------------------+ CCA Prox  140     17                                intimal thickening +----------+--------+--------+--------+------------------+------------------+ CCA Distal122     11              calcific                              +----------+--------+--------+--------+------------------+------------------+ ICA Prox  87      16      1-39%   calcific                             +----------+--------+--------+--------+------------------+------------------+ ICA  Mid   82      16                                                   +----------+--------+--------+--------+------------------+------------------+ ICA Distal97      20                                                   +----------+--------+--------+--------+------------------+------------------+ ECA       84      8                                                    +----------+--------+--------+--------+------------------+------------------+ +----------+--------+-------+--------+-------------------+           PSV cm/sEDV cmsDescribeArm Pressure (mmHG) +----------+--------+-------+--------+-------------------+ [id number]                                        +----------+--------+-------+--------+-------------------+ +---------+--------+--+--------+--+ VertebralPSV cm/s80EDV cm/s14 +---------+--------+--+--------+--+  Left Carotid Findings: +----------+--------+--------+--------+------------------+---------------------+           PSV cm/sEDV cm/sStenosisPlaque DescriptionComments              +----------+--------+--------+--------+------------------+---------------------+ CCA Prox                                            Proximal CCA is not                                                       visualized.           +----------+--------+--------+--------+------------------+---------------------+ CCA Mid                                             Not visualized        +----------+--------+--------+--------+------------------+---------------------+ CCA Distal277     36              calcific          Shadowing              +----------+--------+--------+--------+------------------+---------------------+ ICA Prox  111     19      1-39%   calcific                                +----------+--------+--------+--------+------------------+---------------------+ ICA Mid   100     24                                                      +----------+--------+--------+--------+------------------+---------------------+  ICA Distal71      21                                                      +----------+--------+--------+--------+------------------+---------------------+ ECA       72      0                                                       +----------+--------+--------+--------+------------------+---------------------+ +----------+--------+--------+--------+-------------------+           PSV cm/sEDV cm/sDescribeArm Pressure (mmHG) +----------+--------+--------+--------+-------------------+ [id number]                                         +----------+--------+--------+--------+-------------------+   Summary: Right Carotid: Velocities in the right ICA are consistent with a 1-39% stenosis. Left Carotid: Velocities in the left ICA are consistent with a 1-39% stenosis. Vertebrals:  Right vertebral artery demonstrates antegrade flow. Unable to              visualize left vertebral secondary to line/bandage. Subclavians: Normal flow hemodynamics were seen in bilateral subclavian              arteries. *See table(s) above for measurements and observations.  Electronically signed by Eather Popp MD on 04/26/2024 at 5:21:47 PM.    Final    DG Chest Port 1V same Day Result Date: 04/24/2024 CLINICAL DATA:  [id number] SOB (shortness of breath) [id number] EXAM: PORTABLE CHEST - 1 VIEW SAME DAY COMPARISON:  CT 04/22/2024 and previous FINDINGS: Left IJ HD catheter to the distal SVC. No pneumothorax. Moderate pulmonary vascular congestion. Pleural effusions right greater than left with some atelectasis/consolidation at  the lung bases. Heart size and mediastinal contours are within normal limits. Aortic Atherosclerosis (ICD10-170.0). Regional bones unremarkable. IMPRESSION: Pulmonary vascular congestion with bilateral pleural effusions and basilar atelectasis/consolidation. Electronically Signed   By: JONETTA Faes M.D.   On: 04/24/2024 13:18   MR ANGIO HEAD WO CONTRAST Result Date: 04/24/2024 CLINICAL DATA:  89 year old female code stroke presentation, neurologic deficit. Small area of patchy left centrum semiovale ischemia on brain MRI last night. EXAM: MRA HEAD WITHOUT CONTRAST TECHNIQUE: Angiographic images of the Circle of Willis were acquired using MRA technique without intravenous contrast. COMPARISON:  Brain MRI 04/23/2024. FINDINGS: Anterior circulation: Antegrade flow in both ICA siphons. Left-side persistent trigeminal artery. No ICA siphon stenosis. Patent carotid termini. Dominant left and diminutive or absent right ACA A1 segment. Normal anterior communicating artery. Visible bilateral ACA branches are within normal limits. MCA M1 segments are tortuous, more so the left. There is mild to moderate left MCA M1 segment irregularity and stenosis on series 1020 image 12. MCA bifurcations are patent and bilateral MCA branches are within normal limits. Posterior circulation: Antegrade flow in the posterior circulation with dominant appearing distal right vertebral artery. Diminutive distal left vertebral artery appears to terminates in PICA. Right PICA appears patent. Patent basilar artery without stenosis. And furthermore there is a left-sided persistent trigeminal artery (series 5, image 108), also contributing to the distal basilar. Normal SCA and left PCA origins. Fetal type right PCA  origin. Left posterior communicating artery diminutive or absent. Left PCA branches are within normal limits. There is mild to moderate irregularity and stenosis of the right PCA P2 segment broadly (series 1032, image 9). Distal right PCA  branches remain patent. Anatomic variants: Persistent left side trigeminal artery. Dominant right vertebral, left vertebral terminates in PICA. Fetal right PCA origin. Dominant left and diminutive or absent right ACA A1 segments. Other: No intracranial mass effect or ventriculomegaly. IMPRESSION: 1. Negative for large vessel occlusion. 2. Intracranial atherosclerosis, but mild for age overall. Mild to moderate irregularity and stenosis of the left MCA M1 and right PCA P2 segments. 3. Multiple vascular anatomic variations, including persistent Left side Trigeminal Artery. Electronically Signed   By: VEAR Hurst M.D.   On: 04/24/2024 07:07   MR BRAIN WO CONTRAST Result Date: 04/23/2024 EXAM: MRI BRAIN WITHOUT CONTRAST 04/23/2024 09:10:11 PM TECHNIQUE: Multiplanar multisequence MRI of the head/brain was performed without the administration of intravenous contrast. COMPARISON: None available. CLINICAL HISTORY: Stroke, follow up. FINDINGS: BRAIN AND VENTRICLES: Small area of early subacute or acute ischemia within the posterior left frontal white matter. Bilateral early confluent hyperintense T2-weighted signal within the cerebral white matter. No intracranial hemorrhage. No mass. No midline shift. No hydrocephalus. The sella is unremarkable. Normal flow voids. ORBITS: No acute abnormality. SINUSES AND MASTOIDS: No acute abnormality. BONES AND SOFT TISSUES: Normal marrow signal. No acute soft tissue abnormality. IMPRESSION: 1. Small area of early subacute/acute ischemia within the posterior left frontal white matter. 2. Bilateral early confluent hyperintense T2-weighted signal within the cerebral white matter, most commonly due to chronic small vessel disease. Electronically signed by: Franky Stanford MD 04/23/2024 09:57 PM EDT RP Workstation: [id number]   ECHOCARDIOGRAM COMPLETE Result Date: 04/23/2024    ECHOCARDIOGRAM REPORT   Patient Name:   TANESHIA LORENCE Date of Exam: 04/23/2024 Medical Rec #:  [id number]         Height:       66.0 in Accession #:    [id number]       Weight:       112.9 lb Date of Birth:  18-Apr-1934       BSA:          1.568 m Patient Age:    89 years         BP:           172/65 mmHg Patient Gender: F                HR:           84 bpm. Exam Location:  Inpatient Procedure: 2D Echo, Cardiac Doppler and Color Doppler (Both Spectral and Color            Flow Doppler were utilized during procedure). Indications:    Acute Renal Failure  History:        Patient has prior history of Echocardiogram examinations. Renal                 Failure.  Sonographer:    Vella Key Referring Phys: [id number] MATTHEW R HUNSUCKER IMPRESSIONS  1. Left ventricular ejection fraction, by estimation, is 65 to 70%. The left ventricle has normal function. The left ventricle has no regional wall motion abnormalities. There is mild left ventricular hypertrophy. Left ventricular diastolic parameters were normal.  2. Right ventricular systolic function is normal. The right ventricular size is mildly enlarged.  3. Left atrial size was moderately dilated.  4. Mild mitral valve regurgitation.  5. The aortic  valve is tricuspid. Aortic valve regurgitation is not visualized. Aortic valve sclerosis/calcification is present, without any evidence of aortic stenosis. FINDINGS  Left Ventricle: Left ventricular ejection fraction, by estimation, is 65 to 70%. The left ventricle has normal function. The left ventricle has no regional wall motion abnormalities. The left ventricular internal cavity size was small. There is mild left ventricular hypertrophy. Left ventricular diastolic parameters were normal. Right Ventricle: The right ventricular size is mildly enlarged. Right vetricular wall thickness was not assessed. Right ventricular systolic function is normal. Left Atrium: Left atrial size was moderately dilated. Right Atrium: Right atrial size was normal in size. Pericardium: There is no evidence of pericardial effusion. Mitral Valve: There is mild  thickening of the mitral valve leaflet(s). Mild to moderate mitral annular calcification. Mild mitral valve regurgitation. Tricuspid Valve: The tricuspid valve is normal in structure. Tricuspid valve regurgitation is trivial. Aortic Valve: The aortic valve is tricuspid. Aortic valve regurgitation is not visualized. Aortic valve sclerosis/calcification is present, without any evidence of aortic stenosis. Pulmonic Valve: The pulmonic valve was normal in structure. Pulmonic valve regurgitation is trivial. Aorta: The aortic root and ascending aorta are structurally normal, with no evidence of dilitation. IAS/Shunts: No atrial level shunt detected by color flow Doppler.  LEFT VENTRICLE PLAX 2D LVIDd:         3.10 cm     Diastology LVIDs:         1.80 cm     LV e' medial:    12.80 cm/s LV PW:         1.30 cm     LV E/e' medial:  15.1 LV IVS:        1.10 cm     LV e' lateral:   13.50 cm/s LVOT diam:     1.70 cm     LV E/e' lateral: 14.3 LV SV:         70 LV SV Index:   45 LVOT Area:     2.27 cm  LV Volumes (MOD) LV vol d, MOD A2C: 58.4 ml LV vol d, MOD A4C: 83.9 ml LV vol s, MOD A2C: 15.4 ml LV vol s, MOD A4C: 32.8 ml LV SV MOD A2C:     43.0 ml LV SV MOD A4C:     83.9 ml LV SV MOD BP:      46.6 ml RIGHT VENTRICLE RV Basal diam:  3.90 cm RV S prime:     14.30 cm/s TAPSE (M-mode): 1.5 cm LEFT ATRIUM             Index        RIGHT ATRIUM           Index LA diam:        4.80 cm 3.06 cm/m   RA Area:     12.00 cm LA Vol (A2C):   68.5 ml 43.68 ml/m  RA Volume:   28.80 ml  18.36 ml/m LA Vol (A4C):   63.3 ml 40.36 ml/m LA Biplane Vol: 66.4 ml 42.34 ml/m  AORTIC VALVE LVOT Vmax:   135.00 cm/s LVOT Vmean:  105.000 cm/s LVOT VTI:    0.309 m  AORTA Ao Root diam: 2.80 cm Ao Asc diam:  2.80 cm MITRAL VALVE MV Area (PHT): 5.42 cm     SHUNTS MV Decel Time: 140 msec     Systemic VTI:  0.31 m MV E velocity: 193.00 cm/s  Systemic Diam: 1.70 cm MV A velocity: 64.50 cm/s MV E/A ratio:  2.99  Vina Gull MD Electronically signed by Vina Gull MD Signature Date/Time: 04/23/2024/3:01:59 PM    Final    CT HEAD CODE STROKE WO CONTRAST Result Date: 04/23/2024 CLINICAL DATA:  Code stroke. Neuro deficit, acute, stroke suspected. Right arm weakness. EXAM: CT HEAD WITHOUT CONTRAST TECHNIQUE: Contiguous axial images were obtained from the base of the skull through the vertex without intravenous contrast. RADIATION DOSE REDUCTION: This exam was performed according to the departmental dose-optimization program which includes automated exposure control, adjustment of the mA and/or kV according to patient size and/or use of iterative reconstruction technique. COMPARISON:  Head CT 04/21/2008. FINDINGS: Brain: Generalized cerebral atrophy. Patchy and ill-defined hypoattenuation within the cerebral white matter, nonspecific but compatible with moderate to advanced chronic small vessel ischemic disease. There is no acute intracranial hemorrhage. No demarcated cortical infarct. No extra-axial fluid collection. No evidence of an intracranial mass. No midline shift. Vascular: No hyperdense vessel.  Atherosclerotic calcifications. Skull: No calvarial fracture or aggressive osseous lesion. Sinuses/Orbits: No mass or acute finding within the imaged orbits. No significant paranasal sinus disease. ASPECTS [facility] Stroke Program Early CT Score) - Ganglionic level infarction (caudate, lentiform nuclei, internal capsule, insula, M1-M3 cortex): 7 - Supraganglionic infarction (M4-M6 cortex): 3 Total score (0-10 with 10 being normal): 10 No evidence of an acute intracranial abnormality. These results were communicated to Dr. Matthews At 1:39 pmon 7/18/2025by text page via the [facility] messaging system. IMPRESSION: 1. No evidence of an acute intracranial abnormality. 2. Parenchymal atrophy and chronic small vessel ischemic disease, progressed from the prior head CT of 04/21/2008. Electronically Signed   By: Rockey Childs D.O.   On: 04/23/2024 13:39   CT CHEST WO CONTRAST Result  Date: 04/22/2024 CLINICAL DATA:  History of lung carcinoma with metastatic disease, shortness of breath EXAM: CT CHEST WITHOUT CONTRAST TECHNIQUE: Multidetector CT imaging of the chest was performed following the standard protocol without IV contrast. RADIATION DOSE REDUCTION: This exam was performed according to the departmental dose-optimization program which includes automated exposure control, adjustment of the mA and/or kV according to patient size and/or use of iterative reconstruction technique. COMPARISON:  04/09/2024 FINDINGS: Cardiovascular: Heart is stable in size. Heavy coronary calcifications are noted. Atherosclerotic calcifications of the aorta are noted without aneurysmal dilatation. Mediastinum/Nodes: Thoracic inlet shows evidence of a left jugular dialysis catheter the esophagus as visualized is within normal limits. No hilar or mediastinal adenopathy is noted. Lungs/Pleura: Lungs again demonstrate bilateral lower lobe infiltrates with associated effusions right greater than left. These changes have progressed in the interval from the prior exam. Scattered small nodules are again seen to include a 3 mm nodule in the left lower lobe on image number 81 of series 4. New patchy somewhat nodular density is noted in the left lower lobe on image number 58 of series 4 measuring up to 10 mm in average diameter. Given its abrupt onset and adjacent progressive infiltrate, this is felt to be postinflammatory in nature. Previously seen right lower lobe nodule is now enveloped within the increasing basilar infiltrate. No other sizable nodules are seen. Upper Abdomen: As described on concomitant CT of the abdomen and pelvis. Musculoskeletal: Old healed rib fractures are seen. Heel metastatic changes are noted involving the vertebral bodies from T8-T12. T11 vertebral plana with increased kyphosis is seen. Previously seen nodule in the region of the left breast is again identified and stable. Again possibility of  neoplastic lesion deserves consideration. IMPRESSION: Progressive bilateral lower lobe infiltrate with associated effusions. Stable parenchymal nodules when compared  with the prior exam. Healed metastatic disease Aortic Atherosclerosis (ICD10-I70.0) and Emphysema (ICD10-J43.9). Electronically Signed   By: Oneil Devonshire M.D.   On: 04/22/2024 19:29   CT RENAL STONE STUDY Result Date: 04/22/2024 CLINICAL DATA:  Flank pain EXAM: CT ABDOMEN AND PELVIS WITHOUT CONTRAST TECHNIQUE: Multidetector CT imaging of the abdomen and pelvis was performed following the standard protocol without IV contrast. RADIATION DOSE REDUCTION: This exam was performed according to the departmental dose-optimization program which includes automated exposure control, adjustment of the mA and/or kV according to patient size and/or use of iterative reconstruction technique. COMPARISON:  03/25/2024 FINDINGS: Lower chest: Bilateral lower lobe consolidation and effusions are seen. This is worse on the right than the left. Hepatobiliary: Known hepatic lesions are not well visualized on this noncontrast study. Status post cholecystectomy. No biliary dilatation. Pancreas: Unremarkable. No pancreatic ductal dilatation or surrounding inflammatory changes. Spleen: Normal in size without focal abnormality. Adrenals/Urinary Tract: Left adrenal gland is within normal limits. Right adrenal nodule is again noted and stable. No follow-up is recommended. Kidneys are well visualized bilaterally. Bilateral nonobstructing renal calculi are noted. The largest of these on the left measures 8 mm. Largest on the right measures approximately 3 mm. The ureters are within normal limits. The bladder is decompressed. Stomach/Bowel: Fecal material is noted scattered throughout the colon. No obstructive changes are seen. The appendix has been surgically removed. Small bowel and stomach are within normal limits. No obstructive changes are seen. Vascular/Lymphatic: Aortic  atherosclerosis. No enlarged abdominal or pelvic lymph nodes. Reproductive: Status post hysterectomy. No adnexal masses. Other: No abdominal wall hernia or abnormality. No abdominopelvic ascites. Musculoskeletal: Vertebral plana is noted at T11. Increased sclerosis is noted in T10 and T12 consistent with prior metastatic disease. Anterolisthesis of L5 on S1 is noted. Metastatic disease in the L4 spinous process is noted as well. IMPRESSION: Bilateral lower lobe consolidation and effusions right greater than left. Bilateral nonobstructing renal calculi as described. Scattered fecal material without obstructive change. Healed metastatic bony lesions Known hepatic metastatic disease is not well appreciated on this noncontrast study. Electronically Signed   By: Oneil Devonshire M.D.   On: 04/22/2024 19:12   ECHOCARDIOGRAM COMPLETE Result Date: 04/09/2024    ECHOCARDIOGRAM REPORT   Patient Name:   KELYN PONCIANO Date of Exam: 04/09/2024 Medical Rec #:  [id number]        Height:       66.0 in Accession #:    [id number]       Weight:       109.9 lb Date of Birth:  1934-05-04       BSA:          1.551 m Patient Age:    89 years         BP:           154/66 mmHg Patient Gender: F                HR:           99 bpm. Exam Location:  Inpatient Procedure: 2D Echo, Cardiac Doppler and Color Doppler (Both Spectral and Color            Flow Doppler were utilized during procedure). Indications:    Atrial Fibrillation  History:        Patient has prior history of Echocardiogram examinations, most                 recent 12/24/2023. CHF, CAD, Arrythmias:Atrial Fibrillation; Risk  Factors:Hypertension.  Sonographer:    Philomena Daring Referring Phys: [id number] MARSA NOVAK MELVIN IMPRESSIONS  1. Left ventricular ejection fraction, by estimation, is 60 to 65%. The left ventricle has normal function. The left ventricle has no regional wall motion abnormalities. There is moderate asymmetric left ventricular hypertrophy of the basal  and septal segments. Left ventricular diastolic function could not be evaluated.  2. Right ventricular systolic function is normal. The right ventricular size is normal. Tricuspid regurgitation signal is inadequate for assessing PA pressure.  3. Left atrial size was severely dilated.  4. Right atrial size was mild to moderately dilated.  5. The mitral valve is abnormal. No evidence of mitral valve regurgitation. No evidence of mitral stenosis. Moderate to severe mitral annular calcification.  6. The aortic valve is tricuspid. Aortic valve regurgitation is mild. No aortic stenosis is present.  7. The inferior vena cava is dilated in size with >50% respiratory variability, suggesting right atrial pressure of 8 mmHg. Comparison(s): Changes from prior study are noted. AR is moderate on prior study. FINDINGS  Left Ventricle: Left ventricular ejection fraction, by estimation, is 60 to 65%. The left ventricle has normal function. The left ventricle has no regional wall motion abnormalities. Strain was performed and the global longitudinal strain is indeterminate. The left ventricular internal cavity size was normal in size. There is moderate asymmetric left ventricular hypertrophy of the basal and septal segments. Left ventricular diastolic function could not be evaluated due to atrial fibrillation. Left ventricular diastolic function could not be evaluated. Right Ventricle: The right ventricular size is normal. No increase in right ventricular wall thickness. Right ventricular systolic function is normal. Tricuspid regurgitation signal is inadequate for assessing PA pressure. Left Atrium: Left atrial size was severely dilated. Right Atrium: Right atrial size was mild to moderately dilated. Pericardium: There is no evidence of pericardial effusion. Mitral Valve: The mitral valve is abnormal. Moderate to severe mitral annular calcification. No evidence of mitral valve regurgitation. No evidence of mitral valve stenosis.  Tricuspid Valve: The tricuspid valve is normal in structure. Tricuspid valve regurgitation is mild . No evidence of tricuspid stenosis. Aortic Valve: The aortic valve is tricuspid. Aortic valve regurgitation is mild. No aortic stenosis is present. Pulmonic Valve: The pulmonic valve was grossly normal. Pulmonic valve regurgitation is not visualized. No evidence of pulmonic stenosis. Aorta: The aortic root and ascending aorta are structurally normal, with no evidence of dilitation. Venous: The inferior vena cava is dilated in size with greater than 50% respiratory variability, suggesting right atrial pressure of 8 mmHg. IAS/Shunts: The interatrial septum was not well visualized. Additional Comments: 3D was performed not requiring image post processing on an independent workstation and was indeterminate.  LEFT VENTRICLE PLAX 2D LVIDd:         3.80 cm   Diastology LVIDs:         2.50 cm   LV e' medial:    4.57 cm/s LV PW:         1.00 cm   LV E/e' medial:  26.8 LV IVS:        1.10 cm   LV e' lateral:   8.27 cm/s LVOT diam:     1.80 cm   LV E/e' lateral: 14.8 LV SV:         58 LV SV Index:   37 LVOT Area:     2.54 cm  RIGHT VENTRICLE            IVC RV S prime:  7.83 cm/s  IVC diam: 2.10 cm TAPSE (M-mode): 1.3 cm LEFT ATRIUM             Index        RIGHT ATRIUM           Index LA Vol (A2C):   79.0 ml 50.95 ml/m  RA Area:     17.50 cm LA Vol (A4C):   69.2 ml 44.63 ml/m  RA Volume:   48.70 ml  31.41 ml/m LA Biplane Vol: 80.0 ml 51.60 ml/m  AORTIC VALVE LVOT Vmax:   100.00 cm/s LVOT Vmean:  69.500 cm/s LVOT VTI:    0.227 m  AORTA Ao Root diam: 2.50 cm Ao Asc diam:  3.00 cm MITRAL VALVE MV Area (PHT): 4.21 cm     SHUNTS MV Decel Time: 180 msec     Systemic VTI:  0.23 m MV E velocity: 122.50 cm/s  Systemic Diam: 1.80 cm MV A velocity: 104.00 cm/s MV E/A ratio:  1.18 Vishnu Priya Mallipeddi Electronically signed by Diannah Late Mallipeddi Signature Date/Time: 04/09/2024/4:07:35 PM    Final    CT Angio Chest PE W  and/or Wo Contrast Result Date: 04/09/2024 CLINICAL DATA:  MC-CTPulmonary embolism (PE) suspected, high prob history of lung cancer. Sternal chest pain LEFT shoulder pain. * Tracking Code: BO * EXAM: CT ANGIOGRAPHY CHEST WITH CONTRAST TECHNIQUE: Multidetector CT imaging of the chest was performed using the standard protocol during bolus administration of intravenous contrast. Multiplanar CT image reconstructions and MIPs were obtained to evaluate the vascular anatomy. RADIATION DOSE REDUCTION: This exam was performed according to the departmental dose-optimization program which includes automated exposure control, adjustment of the mA and/or kV according to patient size and/or use of iterative reconstruction technique. CONTRAST:  60mL OMNIPAQUE  IOHEXOL  350 MG/ML SOLN COMPARISON:  CT chest 03/25/2024 FINDINGS: Cardiovascular: No filling defects within the pulmonary arteries to suggest acute pulmonary embolism. No significant vascular findings. Normal heart size. No pericardial effusion. Mediastinum/Nodes: No axillary or supraclavicular adenopathy. No mediastinal or hilar adenopathy. No pericardial fluid. Esophagus normal. Lungs/Pleura: Bilateral pleural effusions similar prior. Moderate effusion on the RIGHT and small on the LEFT. There is passive atelectasis of the RIGHT lower lobe. Small subcentimeter nodules in the lung base not changed from recent CT. Increased consolidation at the LEFT lung base (image 259/series 7 and 90/series 6. Upper Abdomen: Hepatic metastasis not well appreciated no acute findings Musculoskeletal: Sclerotic skeletal metastasis again noted. No change. Surgical clips in the lateral RIGHT breast Review of the MIP images confirms the above findings. IMPRESSION: 1. No evidence acute pulmonary embolism. 2. Bilateral pleural effusions with passive atelectasis of the RIGHT lower lobe. 3. Increased consolidation at the LEFT lung base. Concern for LEFT lower lobe pneumonia. 4. Stable small pulmonary  nodules at the lung bases. 5. Stable skeletal metastasis. Electronically Signed   By: Jackquline Boxer M.D.   On: 04/09/2024 11:23   DG Chest Port 1 View Result Date: 04/09/2024 CLINICAL DATA:  Chest pain EXAM: PORTABLE CHEST 1 VIEW COMPARISON:  01/01/2024 FINDINGS: Low volume film. The cardio pericardial silhouette is enlarged. Vascular congestion without overt airspace pulmonary edema. Bibasilar atelectasis/infiltrate is similar in the interval with small bilateral pleural effusions. No acute bony abnormality. Telemetry leads overlie the chest. IMPRESSION: 1. Low volume film with vascular congestion and small bilateral pleural effusions. 2. Bibasilar atelectasis/infiltrate is similar in the interval. Electronically Signed   By: Camellia Candle M.D.   On: 04/09/2024 08:57    Labs: Basic Metabolic Panel: Recent  Labs  Lab 04/29/24 0347 04/30/24 0417 05/03/24 1840 05/03/24 1849 05/04/24 0741 05/05/24 0237  NA 136 138 137 138 135 139  K 4.1 3.5 5.8* 5.8* 4.1 3.2*  CL 102 103 103 105 100 101  CO2 23 24 23   --  22 29  GLUCOSE 122* 150* 109* 112* 75 122*  BUN 33* 32* 35* 35* 35* 36*  CREATININE 2.06* 2.00* 2.66* 2.90* 2.37* 2.19*  CALCIUM  8.1* 8.5* 8.8*  --  9.9 8.5*  MG  --   --  2.1  --   --   --   PHOS  --   --   --   --  4.6 4.3   CBC: Recent Labs  Lab 05/03/24 1840 05/03/24 1849 05/04/24 0741 05/05/24 0237  WBC 16.1*  --  20.5* 14.5*  NEUTROABS 13.5*  --   --   --   HGB 9.4* 9.9* 9.4* 8.9*  HCT 30.0* 29.0* 30.0* 27.6*  MCV 93.8  --  92.9 90.5  PLT 325  --  263 216   Microbiology: Results for orders placed or performed during the hospital encounter of 05/03/24  Resp panel by RT-PCR (RSV, Flu A&B, Covid) Anterior Nasal Swab     Status: None   Collection Time: 05/03/24  5:34 PM   Specimen: Anterior Nasal Swab  Result Value Ref Range Status   SARS Coronavirus 2 by RT PCR NEGATIVE NEGATIVE Final   Influenza A by PCR NEGATIVE NEGATIVE Final   Influenza B by PCR NEGATIVE  NEGATIVE Final    Comment: (NOTE) The Xpert Xpress SARS-CoV-2/FLU/RSV plus assay is intended as an aid in the diagnosis of influenza from Nasopharyngeal swab specimens and should not be used as a sole basis for treatment. Nasal washings and aspirates are unacceptable for Xpert Xpress SARS-CoV-2/FLU/RSV testing.  Fact Sheet for Patients: BloggerCourse.com  Fact Sheet for Healthcare Providers: SeriousBroker.it  This test is not yet approved or cleared by the [place]  FDA and has been authorized for detection and/or diagnosis of SARS-CoV-2 by FDA under an Emergency Use Authorization (EUA). This EUA will remain in effect (meaning this test can be used) for the duration of the COVID-19 declaration under Section 564(b)(1) of the Act, 21 U.S.C. section 360bbb-3(b)(1), unless the authorization is terminated or revoked.     Resp Syncytial Virus by PCR NEGATIVE NEGATIVE Final    Comment: (NOTE) Fact Sheet for Patients: BloggerCourse.com  Fact Sheet for Healthcare Providers: SeriousBroker.it  This test is not yet approved or cleared by the [place]  FDA and has been authorized for detection and/or diagnosis of SARS-CoV-2 by FDA under an Emergency Use Authorization (EUA). This EUA will remain in effect (meaning this test can be used) for the duration of the COVID-19 declaration under Section 564(b)(1) of the Act, 21 U.S.C. section 360bbb-3(b)(1), unless the authorization is terminated or revoked.  Performed at [facility], [address], [place], [place] [address]   Blood culture (routine x 2)     Status: None (Preliminary result)   Collection Time: 05/03/24  6:40 PM   Specimen: BLOOD LEFT ARM  Result Value Ref Range Status   Specimen Description BLOOD LEFT ARM  Final   Special Requests   Final    BOTTLES DRAWN AEROBIC AND ANAEROBIC Blood Culture results may not be  optimal due to an inadequate volume of blood received in culture bottles   Culture   Final    NO GROWTH 2 DAYS Performed at [facility], [address] [place], [place] [address]    Report Status PENDING  Incomplete  Blood culture (routine x 2)     Status: None (Preliminary result)   Collection Time: 05/03/24  6:40 PM   Specimen: BLOOD RIGHT ARM  Result Value Ref Range Status   Specimen Description BLOOD RIGHT ARM  Final   Special Requests   Final    BOTTLES DRAWN AEROBIC AND ANAEROBIC Blood Culture results may not be optimal due to an inadequate volume of blood received in culture bottles   Culture   Final    NO GROWTH 2 DAYS Performed at [facility], [address], [place], [place] [address]    Report Status PENDING  Incomplete    Time coordinating discharge: Over 30 minutes  Marien LITTIE Piety, MD  [facility] 05/05/2024, 12:46 PM

## 2024-05-05 NOTE — Progress Notes (Signed)
 Explained discharge instructions to patient. Reviewed follow up appointment and next medication administration times. Also reviewed education. Patient verbalized having an understanding for instructions given. All belongings are in the patient's possession. IV and telemetry were removed removed by Fle[place] patient's NT. CCMD was notified. No other needs verbalized. Transporting downstairs to the discharge lounge to await her ride.

## 2024-05-05 NOTE — Telephone Encounter (Signed)
 Daughter called to report that she is being discharged this afternoon and will be at her f/u appointment here tomorrow.

## 2024-05-05 NOTE — Progress Notes (Signed)
 [name] NEPHROLOGY PROGRESS NOTE  Pt is a 89 y.o. yo female with past medical history significant for CHF, stroke, A-fib with RVR, CKD IIIb-IV, recent hospitalization for AKI on CKD, CHF exacerbation presented back today with worsening shortness of breath.  Chest x-ray with bilateral pleural effusion and atelectasis.  The patient was recently hospitalized for hyperkalemia and AKI on CKD required brief period of dialysis.  The HD catheter was removed.  She was discharged with oral diuretics with outpatient follow-up.  During hospitalization she had thoracocentesis for pleural effusion, treated with antibiotics for pneumonia and was seen by oncologist.  discharged on 7/25.  Daughter bedside  Assessment/ Plan:  # AKI on CKD versus progressive CKD 4: Renal function declining with ongoing CHF, A-fib with RVR; fortunately nice response to diuresis and as c/w CRS renal function improving as well. Not far off her baseline.  Signing off at this time; please reconsult as needed.  # Hyperkalemia: Managed medically including diuretics; resolved.  # Acute on chronic CHF with preserved EF: Evidence of fluid overload, diuretics as discussed above.   Subjective: Seen and examined.  Patient reported that breathing is somewhat better. Denies nausea, vomiting, chest pain.  Daughter at the bedside.  Objective Vital signs in last 24 hours: Vitals:   05/05/24 0100 05/05/24 0500 05/05/24 0755 05/05/24 0830  BP: (!) 142/54 (!) 158/77 (!) 151/59 (!) 151/59  Pulse: 88 85 96 96  Resp: 18 18 17    Temp: 98.5 F (36.9 C) 98.4 F (36.9 C) 98 F (36.7 C)   TempSrc: Oral Oral Oral   SpO2: 98% 97% 97%   Weight:  49.4 kg    Height:       Weight change: -0.356 kg No intake or output data in the 24 hours ending 05/05/24 0939   Labs: RENAL PANEL Recent Labs  Lab 04/29/24 0347 04/30/24 0417 05/03/24 1840 05/03/24 1849 05/04/24 0741 05/05/24 0237  NA 136 138 137 138 135 139  K 4.1 3.5  5.8* 5.8* 4.1 3.2*  CL 102 103 103 105 100 101  CO2 23 24 23   --  22 29  GLUCOSE 122* 150* 109* 112* 75 122*  BUN 33* 32* 35* 35* 35* 36*  CREATININE 2.06* 2.00* 2.66* 2.90* 2.37* 2.19*  CALCIUM  8.1* 8.5* 8.8*  --  9.9 8.5*  MG  --   --  2.1  --   --   --   PHOS  --   --   --   --  4.6 4.3  ALBUMIN   --   --  2.8*  --  2.8* 2.3*    Liver Function Tests: Recent Labs  Lab 05/03/24 1840 05/04/24 0741 05/05/24 0237  AST 216*  --   --   ALT 169*  --   --   ALKPHOS 254*  --   --   BILITOT 0.8  --   --   PROT 6.1*  --   --   ALBUMIN  2.8* 2.8* 2.3*   No results for input(s): LIPASE, AMYLASE in the last 168 hours. No results for input(s): AMMONIA in the last 168 hours. CBC: Recent Labs    12/24/23 0231 12/25/23 0312 04/25/24 0308 05/03/24 1840 05/03/24 1849 05/04/24 0741 05/05/24 0237  HGB 7.4*   < > 9.2* 9.4* 9.9* 9.4* 8.9*  MCV 104.0*   < > 91.3 93.8  --  92.9 90.5  VITAMINB12 1,125*  --   --   --   --   --   --  FOLATE 28.0  --   --   --   --   --   --   FERRITIN 323*  --   --   --   --   --   --   TIBC 225*  --   --   --   --   --   --   IRON 66  --   --   --   --   --   --    < > = values in this interval not displayed.    Cardiac Enzymes: No results for input(s): CKTOTAL, CKMB, CKMBINDEX, TROPONINI in the last 168 hours. CBG: Recent Labs  Lab 04/28/24 1220 04/28/24 1613 04/29/24 0738  GLUCAP 173* 216* 147*    Iron Studies: No results for input(s): IRON, TIBC, TRANSFERRIN, FERRITIN in the last 72 hours. Studies/Results: CT CHEST ABDOMEN PELVIS WO CONTRAST Result Date: 05/03/2024 CLINICAL DATA:  Shortness of breath EXAM: CT CHEST, ABDOMEN AND PELVIS WITHOUT CONTRAST TECHNIQUE: Multidetector CT imaging of the chest, abdomen and pelvis was performed following the standard protocol without IV contrast. RADIATION DOSE REDUCTION: This exam was performed according to the departmental dose-optimization program which includes automated exposure  control, adjustment of the mA and/or kV according to patient size and/or use of iterative reconstruction technique. COMPARISON:  Chest x-ray from earlier in the same day. FINDINGS: CT CHEST FINDINGS Cardiovascular: Somewhat limited due to lack of IV contrast. Diffuse atherosclerotic calcifications of the aorta are noted without aneurysmal dilatation. Heart is not significantly enlarged in size. Coronary calcifications are noted. Mediastinum/Nodes: The thoracic inlet is within normal limits. No hilar or mediastinal adenopathy is noted. The esophagus as visualized is within normal limits. Lungs/Pleura: Lungs are well aerated bilaterally. Bilateral pleural effusions are noted right greater than left of a large size. Underlying lower lobe consolidation is noted bilateral with some inspissation of material within the bronchial tree. This has increased overall in the interval from prior exam. The previously described patchy nodular changes in left lower lobe along the fissure have resolved in the interval. Peri fissural nodule is noted on the right along the major fissure best seen on image number 47 of series 5. This has decreased significantly in the interval from a prior exam in January of 2005 consistent with Peri fissural lymph node. Few scattered small less than 5 mm nodules are again noted. Musculoskeletal: Persistent sclerotic lesions within the bony structures consistent with healed metastatic disease. T11 vertebral plana is again noted and stable. CT ABDOMEN PELVIS FINDINGS Hepatobiliary: Gallbladder has been surgically removed. The known hepatic metastatic disease is again not well appreciated on the noncontrast study. Some suggestion of a hypodense lesion in the posterior aspect of the right lobe of the liver is noted. Pancreas: Pancreas is within normal limits. Spleen: Normal in size without focal abnormality. Adrenals/Urinary Tract: Adrenal glands are within normal limits. The kidneys demonstrate  nonobstructing renal calculi bilaterally. The overall appearance is stable from the prior exam. No obstructive changes are seen. The bladder is well distended. Stomach/Bowel: Fecal material is noted within the colon without obstructive change. This is similar to that seen on the prior study. The appendix has been surgically removed. Small bowel and stomach appear within normal limits. Vascular/Lymphatic: Aortic atherosclerosis. No enlarged abdominal or pelvic lymph nodes. Reproductive: Status post hysterectomy. No adnexal masses. Other: No abdominal wall hernia or abnormality. No abdominopelvic ascites. Musculoskeletal: Healed metastatic disease is again noted in the lower thoracic spine. Degenerative anterolisthesis of L5 on S1  is noted. Stable metastatic disease in the L4 spinous process is seen. IMPRESSION: CT of the chest: Bilateral pleural effusions slightly increased when compared with the prior exam. Persistent lower lobe consolidation with inspissated material within the lower lobe bronchi. Some interval clearing of the previously described acute infiltrative density in the lower lobe is noted. Scattered small nodules are again seen and stable. No new sizable nodule is seen. Healed metastatic disease. CT of the abdomen and pelvis: Hypodense lesion within the liver incompletely evaluated due to lack of IV contrast consistent with the known history of hepatic metastatic disease. This is better visualized than on the most recent exam. Scattered fecal material throughout the colon without obstructive change. Healed metastatic disease similar to that noted on the prior exam. Nonobstructing renal calculi bilaterally stable from the prior study. Electronically Signed   By: Oneil Devonshire M.D.   On: 05/03/2024 23:45   DG Chest Port 1 View Result Date: 05/03/2024 CLINICAL DATA:  Shortness of breath and cough. EXAM: PORTABLE CHEST 1 VIEW COMPARISON:  April 27, 2024 FINDINGS: The left-sided venous catheter seen on the  prior study has been removed. The cardiac silhouette is mildly enlarged and unchanged in size. There is marked severity calcification of the thoracic aorta. Low lung volumes are seen with subsequent crowding of the bronchovascular lung markings. All mild to moderate severity areas of atelectasis and/or infiltrate are seen within the bilateral lung bases. There are small bilateral pleural effusions. No pneumothorax is identified. Multilevel degenerative changes are seen throughout the thoracic spine. IMPRESSION: 1. Stable cardiomegaly with mild to moderate severity bibasilar atelectasis and/or infiltrate. 2. Small bilateral pleural effusions. Electronically Signed   By: Suzen Dials M.D.   On: 05/03/2024 19:37    Medications: Infusions:    Scheduled Medications:  apixaban   2.5 mg Oral BID   cyanocobalamin   1,000 mcg Oral Daily   furosemide   80 mg Intravenous Q12H   hydrALAZINE   50 mg Oral Q8H   hydroxychloroquine   200 mg Oral Daily   isosorbide  mononitrate  60 mg Oral Daily   metoprolol  tartrate  100 mg Oral BID   pantoprazole   20 mg Oral Daily   rosuvastatin   5 mg Oral QHS   sodium bicarbonate   650 mg Oral BID    have reviewed scheduled and prn medications.  Physical Exam: General: Ill-looking female. Heart:RRR, s1s2 nl Lungs: Better reduced breath sound, mild increased work of breathing Abdomen:soft, Non-tender, non-distended Extremities: Trace no edema Dialysis Access: Removed  Cashel Bellina W 05/05/2024,9:39 AM  LOS: 2 days

## 2024-05-05 NOTE — Discharge Instructions (Addendum)
 I'm glad you are doing better. Your oxygen stayed above 92% while walking and your leg swelling is much better so you are ready to go home! Your TB test is pending and can be sent to the ALF with your FL2.  I have increased the dose of your torsemide  to 40mg  daily which is similar to what you were receiving in the hospital.  Your weight today is 108.9 pounds and have just a small amount of swelling at the ankles. Please stay active and raise your legs up when resting. I also recommend compression stockings up to at least the knee to help prevent swelling.  If you weigh yourself in the morning and see that your weight is above 112, please take an additional dose of the torsemide  around 2pm that day in addition to your early morning dose.  Please follow up with your PCP and/or kidney doctor within 1-2 week to recheck your kidney function and adjust those medications as needed.

## 2024-05-05 NOTE — Evaluation (Signed)
 Physical Therapy Evaluation Patient Details Name: [name] MRN: [id number] DOB: 03/15/1934 Today's Date: 05/05/2024  History of Present Illness  89 y.o. female presents to [facility] hospital on [facility]/03/2024 with complaints of SOB and fluid overload. Pt was recently admitted with AKI and CHF exacerbation as well as CVA compicated by severe hyperkalemia, requiring emergent hemodialysis.PMH: stage IV breast cancer, PAF, HFpEF, HTN, CKD IIIb, anemia, HTN, CVA.  Clinical Impression  Pt presents to PT with deficits in activity tolerance and cardiopulmonary function. Pt is able to ambulate for household distances without UE support of DME with stable SpO2. Pt cites more fatigue in lower extremity musculature than at baseline, denying significant DOE. Pt will benefit from frequent mobilization in an effort to improve activity tolerance and to restore independence. PT discusses the possible benefits of having a 4 wheeled walker with seat for energy conservation, the pt declines needing this piece of DME currently.      If plan is discharge home, recommend the following: A little help with bathing/dressing/bathroom;Assistance with cooking/housework;Assist for transportation;Help with stairs or ramp for entrance   Can travel by private vehicle        Equipment Recommendations None recommended by PT (pt declines the need for a rollator for energy conservation)  Recommendations for Other Services       Functional Status Assessment Patient has had a recent decline in their functional status and demonstrates the ability to make significant improvements in function in a reasonable and predictable amount of time.     Precautions / Restrictions Precautions Precautions: None Recall of Precautions/Restrictions: Intact Precaution/Restrictions Comments: monitor SpO2 Restrictions Weight Bearing Restrictions Per Provider Order: No      Mobility  Bed Mobility                    Transfers Overall  transfer level: Modified independent Equipment used: None Transfers: Sit to/from Stand Sit to Stand: Modified independent (Device/Increase time)                Ambulation/Gait Ambulation/Gait assistance: Supervision Gait Distance (Feet): 200 Feet Assistive device: None Gait Pattern/deviations: Step-through pattern Gait velocity: functional Gait velocity interpretation: 1.31 - 2.62 ft/sec, indicative of limited community ambulator   General Gait Details: slowed step-through gait, no significant instability noted although pt reports weakness and LE fatigue near completion of bout of ambulation  Stairs            Wheelchair Mobility     Tilt Bed    Modified Rankin (Stroke Patients Only)       Balance Overall balance assessment: Needs assistance Sitting-balance support: No upper extremity supported, Feet supported Sitting balance-Leahy Scale: Good     Standing balance support: No upper extremity supported, During functional activity Standing balance-Leahy Scale: Good                               Pertinent Vitals/Pain Pain Assessment Pain Assessment: No/denies pain    Home Living Family/patient expects to be discharged to:: Assisted living Living Arrangements: Alone Available Help at Discharge: Family;Available PRN/intermittently               Additional Comments: pt lives in a 2 level home, ramped entrance and chair lift to 2nd level. Pt reports she plans to discharge to [facility] after this admission    Prior Function Prior Level of Function : Driving;Independent/Modified Independent  Mobility Comments: pt ambulates with RW outside of the home, independent without DME indoors ADLs Comments: daughter bought groceries     Extremity/Trunk Assessment   Upper Extremity Assessment Upper Extremity Assessment: Overall WFL for tasks assessed    Lower Extremity Assessment Lower Extremity Assessment: Generalized  weakness    Cervical / Trunk Assessment Cervical / Trunk Assessment: Kyphotic  Communication   Communication Communication: No apparent difficulties    Cognition Arousal: Alert Behavior During Therapy: WFL for tasks assessed/performed   PT - Cognitive impairments: No apparent impairments                         Following commands: Intact       Cueing Cueing Techniques: Verbal cues     General Comments General comments (skin integrity, edema, etc.): pt on 1L  upon PT arrival, pt weaned to room air with sats at 92% and above when ambulating    Exercises     Assessment/Plan    PT Assessment Patient needs continued PT services  PT Problem List Decreased strength;Decreased activity tolerance;Cardiopulmonary status limiting activity       PT Treatment Interventions DME instruction;Gait training;Therapeutic activities;Therapeutic exercise;Balance training;Patient/family education    PT Goals (Current goals can be found in the Care Plan section)  Acute Rehab PT Goals Patient Stated Goal: to return to independence, avoid readmission PT Goal Formulation: With patient Time For Goal Achievement: 05/19/24 Potential to Achieve Goals: Fair Additional Goals Additional Goal #1: Pt will ambulate fro >500', reporting 1/4 DOE or less, to demonstrate improved tolerance for limited community mobility    Frequency Min 2X/week     Co-evaluation               AM-PAC PT 6 Clicks Mobility  Outcome Measure Help needed turning from your back to your side while in a flat bed without using bedrails?: None Help needed moving from lying on your back to sitting on the side of a flat bed without using bedrails?: None Help needed moving to and from a bed to a chair (including a wheelchair)?: None Help needed standing up from a chair using your arms (e.g., wheelchair or bedside chair)?: None Help needed to walk in hospital room?: A Little Help needed climbing 3-5 steps with  a railing? : A Little 6 Click Score: 22    End of Session   Activity Tolerance: Patient tolerated treatment well Patient left: in chair;with call bell/phone within reach;with family/visitor present Nurse Communication: Mobility status PT Visit Diagnosis: Other abnormalities of gait and mobility (R26.89);Muscle weakness (generalized) (M62.81)    Time: 8876-8851 PT Time Calculation (min) (ACUTE ONLY): 25 min   Charges:   PT Evaluation $PT Eval Low Complexity: 1 Low   PT General Charges $$ ACUTE PT VISIT: 1 Visit         Bernardino JINNY Ruth, PT, DPT Acute Rehabilitation Office [phone]   Bernardino JINNY Ruth 05/05/2024, 12:08 PM

## 2024-05-05 NOTE — TOC Initial Note (Addendum)
 Transition of Care [facility]) - Initial/Assessment Note    Patient Details  Name: [name] MRN: [id number] Date of Birth: Dec 06, 1933  Transition of Care [facility]) CM/SW Contact:    [name], LCSWA Phone Number: 05/05/2024, 10:39 AM  Clinical Narrative:   CSW notified by treatment team that patients and her daughter would like patient to discharge to [facility]. Per daughter, facility needs an FL2. CSW called [facility] and asked to speak with nursing director to see if a chest xray or quantiferon tb gold plus test will suffice. Per MD, patient is possibly medically stable for discharge today, depending on weening from O2.  CSW spoke with [facility] leadership, Harlene Jes, about ALF placement requirements from TOC. Per leadership, ALF placement is not a requirement as a safe discharge pan. TOC can assist if time allots for that assistance. TOC leadership advised CSW that if facility will only take the TB test that takes 72 hrs to complete, the hospital may start the test but patient will need to follow up with PCP or [facility] to have it read and be placed at ALF from the home.   Patient is from home alone but her daughter, Elveria, is a support. Elveria was at bedside with patient when CSW introduced self/role. Elveria informed CSW that she is trying to get her mother back at [facility]. CSW informed Elveria and patient that ALF placement is not typically conducting by the hospital but TOC is able to assist if time allots and patient is not medically stable. CSW informed Elveria that CSW is waiting to hear back from ALF to see which TB test they will take or if they need the standard 72 hr test. CSW informed Elveria that if the facility requires the standard TB test, hospital can start process but aptient will have to follow up w/ PCP or [facility] to read and go to ALF from home. Elveria and patient expressed their understanding verbally.   10:53 AM CSW spoke with Ms. Cline Psychiatrist at  ALF) and asked if a chest xray or quantiferon tb gold plus test will suffice. Ms. Neill said either will work, chest xray will need to say no active TB. CSW inquired if patient can discharge to ALF after clinicals are provided, Ms. Neill stated at this time patient does not have furniture in the room they have available for her. Family will need to move in furniture before patient can discharge to facility. Patient can discharge home while awaiting furniture to be moved into facility. CSW inquired if the hospital sends a FL2 and conducts a TB test (chest xray or quantiferon tb gold plus) could those documents/results still be sent to facility and be used after patient discharges. Ms. Neill stated yes as they are good for 30 days. CSW notified treatment team that facility can take either form of the TB test.   11:04 AM CSW informed Elveria of above information. CSW asked Elveria when she thinks she'll be able to get furniture moved into facility. Per Elveria, she is working with movers to have furniture potentially moved in tomorrow 7/31. CSW updated [facility].   2:21 PM Patient discharging home today. CSW to provide facility with cosigned FL2 and Tb test results when available.   TOC will continue to follow.    Expected Discharge Plan:  (ALF vs Home w/ HH) Barriers to Discharge: Other (must enter comment) (Awaiting to hear back form ALF)   Patient Goals and CMS Choice  Expected Discharge Plan and Services In-house Referral: NA Discharge Planning Services: CM Consult   Living arrangements for the past 2 months: Single Family Home                   DME Agency: NA                  Prior Living Arrangements/Services Living arrangements for the past 2 months: Single Family Home Lives with:: Self Patient language and need for interpreter reviewed:: Yes Do you feel safe going back to the place where you live?: Yes      Need for Family Participation in Patient Care: No  (Comment) Care giver support system in place?: No (comment) Current home services: DME (cane and rolling walker) Criminal Activity/Legal Involvement Pertinent to Current Situation/Hospitalization: No - Comment as needed  Activities of Daily Living      Permission Sought/Granted Permission sought to share information with : Family Supports Permission granted to share information with : Yes, Verbal Permission Granted  Share Information with NAME: Elveria Purpura     Permission granted to share info w Relationship: Daughter  Permission granted to share info w Contact Information: [phone]  Emotional Assessment Appearance:: Appears stated age Attitude/Demeanor/Rapport: Engaged Affect (typically observed): Pleasant Orientation: : Oriented to Self, Oriented to Place, Oriented to  Time, Oriented to Situation Alcohol / Substance Use: Not Applicable Psych Involvement: No (comment)  Admission diagnosis:  Hyperkalemia [E87.5] AKI (acute kidney injury) (HCC) [N17.9] Acute on chronic heart failure, unspecified heart failure type (HCC) [I50.9] Patient Active Problem List   Diagnosis Date Noted   Hyperkalemia 05/03/2024   Acute renal failure (HCC) 04/22/2024   Atrial fibrillation with RVR (HCC) 04/09/2024   Acute on chronic diastolic CHF (congestive heart failure) (HCC) 04/09/2024   CAP (community acquired pneumonia) 04/09/2024   Elevated troponin 01/02/2024   Electrolyte abnormality 12/27/2023   Persistent atrial fibrillation (HCC) 12/27/2023   Diastolic CHF, chronic (HCC) 12/25/2023   Chronic kidney disease, stage 3b (HCC) 12/23/2023   Thrombocytopenia (HCC) 12/06/2023   Medication management 12/03/2023   Counseling and coordination of care 12/02/2023   Odynophagia 12/02/2023   Palliative care encounter 12/02/2023   Protein-calorie malnutrition, severe 12/02/2023   Failure to thrive 11/30/2023   Dysphagia 11/30/2023   Bicytopenia 11/27/2023   Hypokalemia 11/27/2023    Hypertrophic cardiomyopathy (HCC) 11/24/2023   Demand ischemia (HCC) 11/24/2023   Chronic anemia 11/24/2023   Malignant neoplasm of ovary (HCC) 11/24/2023   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, estrogen receptor positive (HCC) 11/12/2023   Infection of hand 02/02/2022   Genetic testing 07/20/2019   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/20/2019   Family history of breast cancer    Carcinoma of upper-outer quadrant of left breast in female, estrogen receptor positive (HCC) 05/19/2019   Left carotid bruit 01/21/2017   Hypertension    Hyperlipidemia    GERD (gastroesophageal reflux disease)    Ureteral calculi    History of breast cancer    History of ovarian cancer    History of skin cancer    Frequency of urination    Dysuria    Kidney stones    CAD (coronary artery disease)    Ductal carcinoma (HCC) 08/11/2013   Serous tumor, 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   PCP:  Elliot Charm, MD Pharmacy:   [facility] [place], [place] - [address] [place] [address] Phone: [phone] Fax: [phone]  [facility]  [id number] [place], [place] - [address] AT [facility] [address] [place] [address] Phone: [phone] Fax: [phone]  [facility] [place], [place] - [address] [place] [address] Phone: [phone] Fax: [phone]  [facility] [address] [place] [address] Phone: [phone] Fax: [phone]     Social Drivers of Health (SDOH) Social History: SDOH Screenings   Food Insecurity: Patient Declined (04/26/2024)  Housing: Unknown (04/26/2024)  Transportation Needs: Patient Declined (04/26/2024)  Utilities: Patient Declined (04/26/2024)  Depression (PHQ2-9): Low Risk  (04/22/2024)  Social Connections: Patient Declined (04/26/2024)  Recent Concern: Social  Connections - Moderately Isolated (04/09/2024)  Tobacco Use: Low Risk  (05/04/2024)   SDOH Interventions:     Readmission Risk Interventions    04/12/2024   11:59 AM 12/09/2023   12:21 PM 11/23/2023   11:56 AM  Readmission Risk Prevention Plan  Transportation Screening Complete Complete Complete  PCP or Specialist Appt within 5-7 Days   Complete  Home Care Screening   Complete  Medication Review (RN CM)   Complete  Medication Review (RN Care Manager) Referral to Pharmacy    HRI or Home Care Consult Complete    SW Recovery Care/Counseling Consult Complete    Palliative Care Screening Not Applicable    Skilled Nursing Facility Not Applicable Complete

## 2024-05-05 NOTE — Plan of Care (Signed)
   Problem: Education: Goal: Knowledge of General Education information will improve Description Including pain rating scale, medication(s)/side effects and non-pharmacologic comfort measures Outcome: Progressing

## 2024-05-05 NOTE — Plan of Care (Signed)
  Problem: Education: Goal: Knowledge of General Education information will improve Description: Including pain rating scale, medication(s)/side effects and non-pharmacologic comfort measures Outcome: Adequate for Discharge   Problem: Health Behavior/Discharge Planning: Goal: Ability to manage health-related needs will improve Outcome: Adequate for Discharge   Problem: Clinical Measurements: Goal: Ability to maintain clinical measurements within normal limits will improve Outcome: Adequate for Discharge Goal: Will remain free from infection Outcome: Adequate for Discharge Goal: Diagnostic test results will improve Outcome: Adequate for Discharge Goal: Respiratory complications will improve Outcome: Adequate for Discharge Goal: Cardiovascular complication will be avoided Outcome: Adequate for Discharge   Problem: Activity: Goal: Risk for activity intolerance will decrease Outcome: Adequate for Discharge   Problem: Nutrition: Goal: Adequate nutrition will be maintained Outcome: Adequate for Discharge   Problem: Coping: Goal: Level of anxiety will decrease Outcome: Adequate for Discharge   Problem: Elimination: Goal: Will not experience complications related to bowel motility Outcome: Adequate for Discharge Goal: Will not experience complications related to urinary retention Outcome: Adequate for Discharge   Problem: Pain Managment: Goal: General experience of comfort will improve and/or be controlled Outcome: Adequate for Discharge   Problem: Safety: Goal: Ability to remain free from injury will improve Outcome: Adequate for Discharge   Problem: Skin Integrity: Goal: Risk for impaired skin integrity will decrease Outcome: Adequate for Discharge   Problem: Education: Goal: Ability to demonstrate management of disease process will improve Outcome: Adequate for Discharge Goal: Ability to verbalize understanding of medication therapies will improve Outcome: Adequate  for Discharge Goal: Individualized Educational Video(s) Outcome: Adequate for Discharge   Problem: Activity: Goal: Capacity to carry out activities will improve Outcome: Adequate for Discharge   Problem: Cardiac: Goal: Ability to achieve and maintain adequate cardiopulmonary perfusion will improve Outcome: Adequate for Discharge   Problem: Acute Rehab PT Goals(only PT should resolve) Goal: PT Additional Goal #1 Outcome: Adequate for Discharge

## 2024-05-05 NOTE — NC FL2 (Addendum)
 [place]  MEDICAID FL2 LEVEL OF CARE FORM     IDENTIFICATION  Patient Name: [name] Birthdate: 01/19/1934 Sex: female Admission Date (Current Location): 05/03/2024  [facility] and [place] Number:  Producer, television/film/video and Address:  [facility], [address], [place], [place] [address]      Provider Number: [id number]  Attending Physician Name and Address:  Alison Marien CROME, MD  Relative Name and Phone Number:       Current Level of Care: Hospital Recommended Level of Care: Assisted Living Facility Prior Approval Number:    Date Approved/Denied:   PASRR Number:    Discharge Plan: Other (Comment) (Assisted Living Facility)    Current Diagnoses: Patient Active Problem List   Diagnosis Date Noted   Hyperkalemia 05/03/2024   Acute renal failure (HCC) 04/22/2024   Atrial fibrillation with RVR (HCC) 04/09/2024   Acute on chronic diastolic CHF (congestive heart failure) (HCC) 04/09/2024   CAP (community acquired pneumonia) 04/09/2024   Elevated troponin 01/02/2024   Electrolyte abnormality 12/27/2023   Persistent atrial fibrillation (HCC) 12/27/2023   Diastolic CHF, chronic (HCC) 12/25/2023   Chronic kidney disease, stage 3b (HCC) 12/23/2023   Thrombocytopenia (HCC) 12/06/2023   Medication management 12/03/2023   Counseling and coordination of care 12/02/2023   Odynophagia 12/02/2023   Palliative care encounter 12/02/2023   Protein-calorie malnutrition, severe 12/02/2023   Failure to thrive 11/30/2023   Dysphagia 11/30/2023   Bicytopenia 11/27/2023   Hypokalemia 11/27/2023   Hypertrophic cardiomyopathy (HCC) 11/24/2023   Demand ischemia (HCC) 11/24/2023   Chronic anemia 11/24/2023   Malignant neoplasm of ovary (HCC) 11/24/2023   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, estrogen receptor positive (HCC) 11/12/2023   Infection of hand 02/02/2022   Genetic testing 07/20/2019   Monoallelic mutation of CHEK2 gene in female patient  07/20/2019   Family history of breast cancer    Carcinoma of upper-outer quadrant of left breast in female, estrogen receptor positive (HCC) 05/19/2019   Left carotid bruit 01/21/2017   Hypertension    Hyperlipidemia    GERD (gastroesophageal reflux disease)    Ureteral calculi    History of breast cancer    History of ovarian cancer    History of skin cancer    Frequency of urination    Dysuria    Kidney stones    CAD (coronary artery disease)    Ductal carcinoma (HCC) 08/11/2013   Serous tumor, 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    Orientation RESPIRATION BLADDER Height & Weight     Self, Time, Situation, Place  Normal Incontinent Weight: 108 lb 14.5 oz (49.4 kg) Height:  5' 8 (172.7 cm)  BEHAVIORAL SYMPTOMS/MOOD NEUROLOGICAL BOWEL NUTRITION STATUS      Continent Diet (Regular healthy diet)  AMBULATORY STATUS COMMUNICATION OF NEEDS Skin   Supervision Verbally Normal                       Personal Care Assistance Level of Assistance  Bathing, Feeding, Dressing Bathing Assistance: Independent Feeding assistance: Independent Dressing Assistance: Independent     Functional Limitations Info  Sight, Hearing, Speech Sight Info: Impaired Hearing Info: Impaired Speech Info: Adequate    SPECIAL CARE FACTORS FREQUENCY                       Contractures Contractures Info: Not present    Additional Factors Info  Code Status, Allergies Code Status Info: DNR Intervention Allergies Info: Ramipril  Discharge Medications: Medication List       TAKE these medications     Belsomra 10 MG Tabs Generic drug: Suvorexant Take 10 mg by mouth at bedtime.    CALTRATE 600+D PO Take 1 tablet by mouth 2 (two) times daily.    cyanocobalamin  1000 MCG tablet Commonly known as: VITAMIN B12 Take 1,000 mcg by mouth daily.    DECUBI-VITE PO Take 1 capsule by mouth daily at 6 (six) AM. For wound healing    diltiazem  360 MG 24 hr capsule Commonly  known as: TIAZAC  Take 360 mg by mouth daily.    Eliquis  2.5 MG Tabs tablet Generic drug: apixaban  Take 1 tablet (2.5 mg total) by mouth 2 (two) times daily.    hydrALAZINE  25 MG tablet Commonly known as: APRESOLINE  Take 1 tablet (25 mg total) by mouth every 8 (eight) hours.    hydroxychloroquine  200 MG tablet Commonly known as: PLAQUENIL  Take 200 mg by mouth daily.    isosorbide  mononitrate 60 MG 24 hr tablet Commonly known as: IMDUR  Take 1 tablet (60 mg total) by mouth daily.    magnesium  oxide 400 MG tablet Commonly known as: MAG-OX Take 2 tablets (800 mg total) by mouth 2 (two) times daily. What changed: how much to take    metoprolol  tartrate 100 MG tablet Commonly known as: LOPRESSOR  Take 1 tablet (100 mg total) by mouth 2 (two) times daily.    oxyCODONE  5 MG immediate release tablet Commonly known as: Oxy IR/ROXICODONE  Take 1 tablet (5 mg total) by mouth every 6 (six) hours as needed.    pantoprazole  20 MG tablet Commonly known as: PROTONIX  Take 1 tablet (20 mg total) by mouth daily.    Potassium Chloride  ER 20 MEQ Tbcr Take 1 tablet by mouth 2 (two) times daily.    rosuvastatin  10 MG tablet Commonly known as: CRESTOR  Take 0.5 tablets (5 mg total) by mouth at bedtime.    sodium bicarbonate  650 MG tablet Take 1 tablet (650 mg total) by mouth 2 (two) times daily.    tiZANidine  4 MG tablet Commonly known as: ZANAFLEX  Take 4 mg by mouth 2 (two) times daily as needed for muscle spasms.    Torsemide  40 MG Tabs Take 40 mg by mouth daily. What changed:  medication strength how much to take    Triple Antibiotic Oint Apply 1 Application topically daily as needed (skin irritations). Apply to skin tears daily as needed.    Vitamin D3 50 MCG (2000 UT) Tabs Take 2,000 Units by mouth in the morning and at bedtime.      Relevant Imaging Results:  Relevant Lab Results:   Additional Information [id number]  [name], LCSWA

## 2024-05-06 ENCOUNTER — Other Ambulatory Visit: Payer: Self-pay

## 2024-05-06 ENCOUNTER — Inpatient Hospital Stay (HOSPITAL_BASED_OUTPATIENT_CLINIC_OR_DEPARTMENT_OTHER): Admitting: Nurse Practitioner

## 2024-05-06 ENCOUNTER — Encounter: Payer: Self-pay | Admitting: Nurse Practitioner

## 2024-05-06 ENCOUNTER — Inpatient Hospital Stay

## 2024-05-06 VITALS — BP 114/62 | HR 98 | Temp 98.0°F | Resp 18 | Ht 68.0 in | Wt 107.2 lb

## 2024-05-06 DIAGNOSIS — C787 Secondary malignant neoplasm of liver and intrahepatic bile duct: Secondary | ICD-10-CM | POA: Insufficient documentation

## 2024-05-06 DIAGNOSIS — C50412 Malignant neoplasm of upper-outer quadrant of left female breast: Secondary | ICD-10-CM | POA: Diagnosis not present

## 2024-05-06 DIAGNOSIS — C7800 Secondary malignant neoplasm of unspecified lung: Secondary | ICD-10-CM | POA: Diagnosis not present

## 2024-05-06 DIAGNOSIS — C7951 Secondary malignant neoplasm of bone: Secondary | ICD-10-CM | POA: Insufficient documentation

## 2024-05-06 DIAGNOSIS — M549 Dorsalgia, unspecified: Secondary | ICD-10-CM | POA: Diagnosis not present

## 2024-05-06 DIAGNOSIS — Z5111 Encounter for antineoplastic chemotherapy: Secondary | ICD-10-CM | POA: Insufficient documentation

## 2024-05-06 DIAGNOSIS — Z170 Estrogen receptor positive status [ER+]: Secondary | ICD-10-CM | POA: Insufficient documentation

## 2024-05-06 DIAGNOSIS — C50912 Malignant neoplasm of unspecified site of left female breast: Secondary | ICD-10-CM | POA: Insufficient documentation

## 2024-05-06 DIAGNOSIS — C50919 Malignant neoplasm of unspecified site of unspecified female breast: Secondary | ICD-10-CM | POA: Diagnosis not present

## 2024-05-06 DIAGNOSIS — C569 Malignant neoplasm of unspecified ovary: Secondary | ICD-10-CM

## 2024-05-06 LAB — CBC WITH DIFFERENTIAL (CANCER CENTER ONLY)
Abs Immature Granulocytes: 0.17 K/uL — ABNORMAL HIGH (ref 0.00–0.07)
Basophils Absolute: 0.1 K/uL (ref 0.0–0.1)
Basophils Relative: 1 %
Eosinophils Absolute: 0.1 K/uL (ref 0.0–0.5)
Eosinophils Relative: 1 %
HCT: 32.6 % — ABNORMAL LOW (ref 36.0–46.0)
Hemoglobin: 10.3 g/dL — ABNORMAL LOW (ref 12.0–15.0)
Immature Granulocytes: 2 %
Lymphocytes Relative: 5 %
Lymphs Abs: 0.6 K/uL — ABNORMAL LOW (ref 0.7–4.0)
MCH: 29 pg (ref 26.0–34.0)
MCHC: 31.6 g/dL (ref 30.0–36.0)
MCV: 91.8 fL (ref 80.0–100.0)
Monocytes Absolute: 0.6 K/uL (ref 0.1–1.0)
Monocytes Relative: 6 %
Neutro Abs: 8.8 K/uL — ABNORMAL HIGH (ref 1.7–7.7)
Neutrophils Relative %: 85 %
Platelet Count: 300 K/uL (ref 150–400)
RBC: 3.55 MIL/uL — ABNORMAL LOW (ref 3.87–5.11)
RDW: 16 % — ABNORMAL HIGH (ref 11.5–15.5)
WBC Count: 10.3 K/uL (ref 4.0–10.5)
nRBC: 0 % (ref 0.0–0.2)

## 2024-05-06 LAB — CMP (CANCER CENTER ONLY)
ALT: 69 U/L — ABNORMAL HIGH (ref 0–44)
AST: 38 U/L (ref 15–41)
Albumin: 3.4 g/dL — ABNORMAL LOW (ref 3.5–5.0)
Alkaline Phosphatase: 201 U/L — ABNORMAL HIGH (ref 38–126)
Anion gap: 11 (ref 5–15)
BUN: 35 mg/dL — ABNORMAL HIGH (ref 8–23)
CO2: 29 mmol/L (ref 22–32)
Calcium: 9.3 mg/dL (ref 8.9–10.3)
Chloride: 101 mmol/L (ref 98–111)
Creatinine: 1.9 mg/dL — ABNORMAL HIGH (ref 0.44–1.00)
GFR, Estimated: 25 mL/min — ABNORMAL LOW (ref >60)
Glucose, Bld: 184 mg/dL — ABNORMAL HIGH (ref 70–99)
Potassium: 3.7 mmol/L (ref 3.5–5.1)
Sodium: 141 mmol/L (ref 135–145)
Total Bilirubin: 0.5 mg/dL (ref 0.0–1.2)
Total Protein: 6.3 g/dL — ABNORMAL LOW (ref 6.5–8.1)

## 2024-05-06 LAB — MAGNESIUM: Magnesium: 1.6 mg/dL — ABNORMAL LOW (ref 1.7–2.4)

## 2024-05-06 NOTE — Progress Notes (Signed)
 [facility] OFFICE PROGRESS NOTE   Diagnosis: Breast cancer  INTERVAL HISTORY:   [name] returns for follow-up.  She continues Faslodex , last injection 04/22/2024.  She continues to feel very weak.  She will be moving to an assisted living facility later this week.  She reports intermittent chest pain.  She has dyspnea at rest.  Objective:  Vital signs in last 24 hours:  Blood pressure 114/62, pulse 98, temperature 98 F (36.7 C), temperature source Temporal, resp. rate 18, height 5' 8 (1.727 m), weight 107 lb 3.2 oz (48.6 kg), last menstrual period 10/07/1974, SpO2 99%.    Resp: Distant breath sounds.  No respiratory distress. Cardio: Irregular. GI: No hepatosplenomegaly. Vascular: No leg edema.  Right hand is mildly edematous. Skin: Ecchymoses scattered over the forearms.   Lab Results:  Lab Results  Component Value Date   WBC 10.3 05/06/2024   HGB 10.3 (L) 05/06/2024   HCT 32.6 (L) 05/06/2024   MCV 91.8 05/06/2024   PLT 300 05/06/2024   NEUTROABS 8.8 (H) 05/06/2024    Imaging:  No results found.  Medications: I have reviewed the patient's current medications.  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 Remote hysterectomy and bilateral salpingo-oophorectomy with the cytology from 1976 confirming numerous psammoma bodies. 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  Status post a needle localized lumpectomy and sentinel lymph node biopsy 04/13/2009 with the pathology confirming high-grade ductal carcinoma in situ with an associated 0.26 mm invasive carcinoma, ER positive, PR positive, and HER2 positive. Status post  adjuvant right breast radiation completed 06/19/2009. Initiation of Arimidex  05/01/2009.  Completed 5 years in July 2015 Family history of breast cancer. History of Clostridium difficile colitis. Frequent bowel movements following the bowel resection in 2009. 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 CTs 10/16/2023-innumerable lung nodules both lungs; multiple new ill-defined low-density liver lesions; bony metastatic disease in the thoracic and lumbar spine.  Severe compression fracture at T11 with osseous retropulsion.  Small right greater than left pleural effusions. Biopsy liver lesion 10/30/2023-metastatic moderate to poorly differentiated adenocarcinoma consistent with breast origin; HER2 negative (1+), ER +100%, PR +30%, Ki-67 60%, ESR 1 nutation positive, PIK3CA negative Faslodex /Abemaciclib  11/07/2023, Abemaciclib  discontinued on hospital admission 11/21/2023 Faslodex  12/12/2023, 12/26/2023, 01/08/2024, 02/26/2024, 03/26/2024, 04/22/2024 CT 03/25/2024-significant decrease in size and number of diffuse millimetric pulmonary nodules/metastasis.  Marked improvement in appearance of hepatic metastasis.  Superior left breast or left chest wall nodule new since 10/16/2023. Faslodex  continued CT chest 04/22/2024-stable parenchymal nodules, progressive bilateral lower lobe infiltrates/effusions CTs without contrast 05/03/2024-bilateral pleural effusions right greater than left.  Persistent lower lobe consolidation.  Scattered small nodules seen,  stable.  Hypodense lesion within the liver incompletely evaluated due to lack of IV contrast.  Healed metastatic disease noted in the lower thoracic spine, stable metastatic disease in the L4  spinous process. 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  Admission 11/21/2023 with acute renal failure, elevated liver enzymes, dehydration, and severe back pain Renal failure and elevated liver enzymes improved, discharged 12/04/2023 13.  Anemia/thrombocytopenia-likely secondary to Abemaciclib , metastatic breast cancer involving the bone marrow, and radiation 14.  Admission 12/05/2023 with chest pain and rapid atrial fibrillation 15.  Admission 12/23/2023 with rapid atrial fibrillation and CHF 16.  Anemia secondary to chronic disease, renal failure, metastatic breast cancer involving the bone marrow, phlebotomy, and chemotherapy/radiation 17.  Lower leg/foot edema with skin breakdown 02/26/2024-improved 03/26/2024 18.  Hypomagnesemia-oral magnesium  initiated 02/27/2024 19.  Admission 04/08/2024 through 04/12/2024 with rapid atrial fibrillation, CHF, pneumonia\ 20.  Admission 04/22/2024 with hyperkalemia and bradycardia-emergent hemodialysis 21.  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  Bilateral pneumonia 04/25/2024  Disposition: [name] appears unchanged.  She has metastatic breast cancer, currently on Faslodex .  Pain continues to be improved.  She would like to continue Faslodex .  We discussed enrollment in hospice as per discussions she had during the recent  hospitalization.  She would like to continue follow-up with palliative care, hold on hospice for now.  She will return for lab, follow-up and Faslodex  as scheduled in 2 weeks.  We are available to see her sooner if needed.  Patient seen with Dr. Cloretta.   Olam Ned ANP/GNP-BC   05/06/2024  11:00 AM  This was a shared visit with Olam Ned.  [name] is interviewed and examined.  She was admitted again this week with volume overload and hyperkalemia.  She has metastatic breast cancer and multiple comorbid conditions.  There is no clear evidence of progressive breast cancer, but her overall prognosis is poor.  We discussed her living situation and hospice care.  She plans to move to an assisted living facility tomorrow.  She would like to continue Faslodex .  She is enrolled in palliative care.  I was present for greater than 50% of today's visit.  I performed Medical Decision Making.  Arvella Cloretta, MD

## 2024-05-08 LAB — CULTURE, BLOOD (ROUTINE X 2)
Culture: NO GROWTH
Culture: NO GROWTH

## 2024-05-09 LAB — QUANTIFERON-TB GOLD PLUS (RQFGPL)
QuantiFERON Mitogen Value: 6.52 [IU]/mL
QuantiFERON Nil Value: 0.02 [IU]/mL
QuantiFERON TB1 Ag Value: 0.02 [IU]/mL
QuantiFERON TB2 Ag Value: 0.01 [IU]/mL

## 2024-05-09 LAB — QUANTIFERON-TB GOLD PLUS: QuantiFERON-TB Gold Plus: NEGATIVE

## 2024-05-10 DIAGNOSIS — I129 Hypertensive chronic kidney disease with stage 1 through stage 4 chronic kidney disease, or unspecified chronic kidney disease: Secondary | ICD-10-CM | POA: Diagnosis not present

## 2024-05-10 DIAGNOSIS — E559 Vitamin D deficiency, unspecified: Secondary | ICD-10-CM | POA: Diagnosis not present

## 2024-05-10 DIAGNOSIS — I639 Cerebral infarction, unspecified: Secondary | ICD-10-CM | POA: Diagnosis not present

## 2024-05-10 DIAGNOSIS — I4891 Unspecified atrial fibrillation: Secondary | ICD-10-CM | POA: Diagnosis not present

## 2024-05-10 DIAGNOSIS — N184 Chronic kidney disease, stage 4 (severe): Secondary | ICD-10-CM | POA: Diagnosis not present

## 2024-05-10 DIAGNOSIS — N189 Chronic kidney disease, unspecified: Secondary | ICD-10-CM | POA: Diagnosis not present

## 2024-05-10 DIAGNOSIS — I509 Heart failure, unspecified: Secondary | ICD-10-CM | POA: Diagnosis not present

## 2024-05-10 DIAGNOSIS — E875 Hyperkalemia: Secondary | ICD-10-CM | POA: Diagnosis not present

## 2024-05-10 DIAGNOSIS — N179 Acute kidney failure, unspecified: Secondary | ICD-10-CM | POA: Diagnosis not present

## 2024-05-10 DIAGNOSIS — D631 Anemia in chronic kidney disease: Secondary | ICD-10-CM | POA: Diagnosis not present

## 2024-05-11 ENCOUNTER — Encounter: Payer: Self-pay | Admitting: Oncology

## 2024-05-12 DIAGNOSIS — D649 Anemia, unspecified: Secondary | ICD-10-CM | POA: Diagnosis not present

## 2024-05-12 DIAGNOSIS — S22080A Wedge compression fracture of T11-T12 vertebra, initial encounter for closed fracture: Secondary | ICD-10-CM | POA: Diagnosis not present

## 2024-05-12 DIAGNOSIS — I509 Heart failure, unspecified: Secondary | ICD-10-CM | POA: Diagnosis not present

## 2024-05-12 DIAGNOSIS — C7800 Secondary malignant neoplasm of unspecified lung: Secondary | ICD-10-CM | POA: Diagnosis not present

## 2024-05-12 DIAGNOSIS — J9611 Chronic respiratory failure with hypoxia: Secondary | ICD-10-CM | POA: Diagnosis not present

## 2024-05-12 DIAGNOSIS — I2510 Atherosclerotic heart disease of native coronary artery without angina pectoris: Secondary | ICD-10-CM | POA: Diagnosis not present

## 2024-05-12 DIAGNOSIS — C50912 Malignant neoplasm of unspecified site of left female breast: Secondary | ICD-10-CM | POA: Diagnosis not present

## 2024-05-12 DIAGNOSIS — N184 Chronic kidney disease, stage 4 (severe): Secondary | ICD-10-CM | POA: Diagnosis not present

## 2024-05-12 DIAGNOSIS — I10 Essential (primary) hypertension: Secondary | ICD-10-CM | POA: Diagnosis not present

## 2024-05-12 DIAGNOSIS — C50312 Malignant neoplasm of lower-inner quadrant of left female breast: Secondary | ICD-10-CM | POA: Diagnosis not present

## 2024-05-12 DIAGNOSIS — I25119 Atherosclerotic heart disease of native coronary artery with unspecified angina pectoris: Secondary | ICD-10-CM | POA: Diagnosis not present

## 2024-05-12 DIAGNOSIS — C787 Secondary malignant neoplasm of liver and intrahepatic bile duct: Secondary | ICD-10-CM | POA: Diagnosis not present

## 2024-05-12 DIAGNOSIS — I4891 Unspecified atrial fibrillation: Secondary | ICD-10-CM | POA: Diagnosis not present

## 2024-05-12 DIAGNOSIS — C7951 Secondary malignant neoplasm of bone: Secondary | ICD-10-CM | POA: Diagnosis not present

## 2024-05-12 DIAGNOSIS — K219 Gastro-esophageal reflux disease without esophagitis: Secondary | ICD-10-CM | POA: Diagnosis not present

## 2024-05-12 DIAGNOSIS — N179 Acute kidney failure, unspecified: Secondary | ICD-10-CM | POA: Diagnosis not present

## 2024-05-12 DIAGNOSIS — I5032 Chronic diastolic (congestive) heart failure: Secondary | ICD-10-CM | POA: Diagnosis not present

## 2024-05-12 DIAGNOSIS — Z8701 Personal history of pneumonia (recurrent): Secondary | ICD-10-CM | POA: Diagnosis not present

## 2024-05-13 DIAGNOSIS — C7951 Secondary malignant neoplasm of bone: Secondary | ICD-10-CM | POA: Diagnosis not present

## 2024-05-13 DIAGNOSIS — C787 Secondary malignant neoplasm of liver and intrahepatic bile duct: Secondary | ICD-10-CM | POA: Diagnosis not present

## 2024-05-13 DIAGNOSIS — C50912 Malignant neoplasm of unspecified site of left female breast: Secondary | ICD-10-CM | POA: Diagnosis not present

## 2024-05-13 DIAGNOSIS — I10 Essential (primary) hypertension: Secondary | ICD-10-CM | POA: Diagnosis not present

## 2024-05-13 DIAGNOSIS — I4891 Unspecified atrial fibrillation: Secondary | ICD-10-CM | POA: Diagnosis not present

## 2024-05-13 DIAGNOSIS — C7800 Secondary malignant neoplasm of unspecified lung: Secondary | ICD-10-CM | POA: Diagnosis not present

## 2024-05-14 DIAGNOSIS — I10 Essential (primary) hypertension: Secondary | ICD-10-CM | POA: Diagnosis not present

## 2024-05-14 DIAGNOSIS — C7951 Secondary malignant neoplasm of bone: Secondary | ICD-10-CM | POA: Diagnosis not present

## 2024-05-14 DIAGNOSIS — C7800 Secondary malignant neoplasm of unspecified lung: Secondary | ICD-10-CM | POA: Diagnosis not present

## 2024-05-14 DIAGNOSIS — C50912 Malignant neoplasm of unspecified site of left female breast: Secondary | ICD-10-CM | POA: Diagnosis not present

## 2024-05-14 DIAGNOSIS — C787 Secondary malignant neoplasm of liver and intrahepatic bile duct: Secondary | ICD-10-CM | POA: Diagnosis not present

## 2024-05-14 DIAGNOSIS — I4891 Unspecified atrial fibrillation: Secondary | ICD-10-CM | POA: Diagnosis not present

## 2024-05-18 DIAGNOSIS — I10 Essential (primary) hypertension: Secondary | ICD-10-CM | POA: Diagnosis not present

## 2024-05-18 DIAGNOSIS — C787 Secondary malignant neoplasm of liver and intrahepatic bile duct: Secondary | ICD-10-CM | POA: Diagnosis not present

## 2024-05-18 DIAGNOSIS — C7800 Secondary malignant neoplasm of unspecified lung: Secondary | ICD-10-CM | POA: Diagnosis not present

## 2024-05-18 DIAGNOSIS — C7951 Secondary malignant neoplasm of bone: Secondary | ICD-10-CM | POA: Diagnosis not present

## 2024-05-18 DIAGNOSIS — C50912 Malignant neoplasm of unspecified site of left female breast: Secondary | ICD-10-CM | POA: Diagnosis not present

## 2024-05-18 DIAGNOSIS — I4891 Unspecified atrial fibrillation: Secondary | ICD-10-CM | POA: Diagnosis not present

## 2024-05-20 ENCOUNTER — Inpatient Hospital Stay

## 2024-05-20 ENCOUNTER — Inpatient Hospital Stay (HOSPITAL_BASED_OUTPATIENT_CLINIC_OR_DEPARTMENT_OTHER): Admitting: Oncology

## 2024-05-20 ENCOUNTER — Other Ambulatory Visit

## 2024-05-20 ENCOUNTER — Inpatient Hospital Stay: Attending: Oncology

## 2024-05-20 ENCOUNTER — Ambulatory Visit

## 2024-05-20 ENCOUNTER — Encounter: Payer: Self-pay | Admitting: *Deleted

## 2024-05-20 ENCOUNTER — Ambulatory Visit: Admitting: Oncology

## 2024-05-20 VITALS — BP 123/59 | HR 60 | Temp 97.8°F | Resp 18 | Ht 68.0 in | Wt 97.6 lb

## 2024-05-20 DIAGNOSIS — C7802 Secondary malignant neoplasm of left lung: Secondary | ICD-10-CM | POA: Diagnosis not present

## 2024-05-20 DIAGNOSIS — Z1501 Genetic susceptibility to malignant neoplasm of breast: Secondary | ICD-10-CM | POA: Insufficient documentation

## 2024-05-20 DIAGNOSIS — Z170 Estrogen receptor positive status [ER+]: Secondary | ICD-10-CM

## 2024-05-20 DIAGNOSIS — C7801 Secondary malignant neoplasm of right lung: Secondary | ICD-10-CM | POA: Diagnosis not present

## 2024-05-20 DIAGNOSIS — I4891 Unspecified atrial fibrillation: Secondary | ICD-10-CM | POA: Diagnosis not present

## 2024-05-20 DIAGNOSIS — C787 Secondary malignant neoplasm of liver and intrahepatic bile duct: Secondary | ICD-10-CM | POA: Diagnosis not present

## 2024-05-20 DIAGNOSIS — Z5111 Encounter for antineoplastic chemotherapy: Secondary | ICD-10-CM | POA: Insufficient documentation

## 2024-05-20 DIAGNOSIS — C7800 Secondary malignant neoplasm of unspecified lung: Secondary | ICD-10-CM | POA: Diagnosis not present

## 2024-05-20 DIAGNOSIS — Z1732 Human epidermal growth factor receptor 2 negative status: Secondary | ICD-10-CM | POA: Diagnosis not present

## 2024-05-20 DIAGNOSIS — C569 Malignant neoplasm of unspecified ovary: Secondary | ICD-10-CM

## 2024-05-20 DIAGNOSIS — C7951 Secondary malignant neoplasm of bone: Secondary | ICD-10-CM | POA: Insufficient documentation

## 2024-05-20 DIAGNOSIS — C50912 Malignant neoplasm of unspecified site of left female breast: Secondary | ICD-10-CM | POA: Insufficient documentation

## 2024-05-20 DIAGNOSIS — C50412 Malignant neoplasm of upper-outer quadrant of left female breast: Secondary | ICD-10-CM | POA: Diagnosis not present

## 2024-05-20 DIAGNOSIS — I10 Essential (primary) hypertension: Secondary | ICD-10-CM | POA: Diagnosis not present

## 2024-05-20 DIAGNOSIS — C50919 Malignant neoplasm of unspecified site of unspecified female breast: Secondary | ICD-10-CM

## 2024-05-20 DIAGNOSIS — M549 Dorsalgia, unspecified: Secondary | ICD-10-CM

## 2024-05-20 LAB — CBC WITH DIFFERENTIAL (CANCER CENTER ONLY)
Abs Immature Granulocytes: 0.12 K/uL — ABNORMAL HIGH (ref 0.00–0.07)
Basophils Absolute: 0.1 K/uL (ref 0.0–0.1)
Basophils Relative: 1 %
Eosinophils Absolute: 0.2 K/uL (ref 0.0–0.5)
Eosinophils Relative: 2 %
HCT: 33.4 % — ABNORMAL LOW (ref 36.0–46.0)
Hemoglobin: 10.6 g/dL — ABNORMAL LOW (ref 12.0–15.0)
Immature Granulocytes: 1 %
Lymphocytes Relative: 11 %
Lymphs Abs: 1.3 K/uL (ref 0.7–4.0)
MCH: 29.6 pg (ref 26.0–34.0)
MCHC: 31.7 g/dL (ref 30.0–36.0)
MCV: 93.3 fL (ref 80.0–100.0)
Monocytes Absolute: 0.9 K/uL (ref 0.1–1.0)
Monocytes Relative: 7 %
Neutro Abs: 9.4 K/uL — ABNORMAL HIGH (ref 1.7–7.7)
Neutrophils Relative %: 78 %
Platelet Count: 319 K/uL (ref 150–400)
RBC: 3.58 MIL/uL — ABNORMAL LOW (ref 3.87–5.11)
RDW: 17.2 % — ABNORMAL HIGH (ref 11.5–15.5)
WBC Count: 12 K/uL — ABNORMAL HIGH (ref 4.0–10.5)
nRBC: 0 % (ref 0.0–0.2)

## 2024-05-20 LAB — CMP (CANCER CENTER ONLY)
ALT: 47 U/L — ABNORMAL HIGH (ref 0–44)
AST: 47 U/L — ABNORMAL HIGH (ref 15–41)
Albumin: 3.8 g/dL (ref 3.5–5.0)
Alkaline Phosphatase: 155 U/L — ABNORMAL HIGH (ref 38–126)
Anion gap: 13 (ref 5–15)
BUN: 36 mg/dL — ABNORMAL HIGH (ref 8–23)
CO2: 21 mmol/L — ABNORMAL LOW (ref 22–32)
Calcium: 9.3 mg/dL (ref 8.9–10.3)
Chloride: 109 mmol/L (ref 98–111)
Creatinine: 1.97 mg/dL — ABNORMAL HIGH (ref 0.44–1.00)
GFR, Estimated: 24 mL/min — ABNORMAL LOW (ref >60)
Glucose, Bld: 142 mg/dL — ABNORMAL HIGH (ref 70–99)
Potassium: 4.2 mmol/L (ref 3.5–5.1)
Sodium: 143 mmol/L (ref 135–145)
Total Bilirubin: 0.6 mg/dL (ref 0.0–1.2)
Total Protein: 6.7 g/dL (ref 6.5–8.1)

## 2024-05-20 LAB — MAGNESIUM: Magnesium: 1.5 mg/dL — ABNORMAL LOW (ref 1.7–2.4)

## 2024-05-20 MED ORDER — OXYCODONE HCL 5 MG PO TABS
5.0000 mg | ORAL_TABLET | Freq: Once | ORAL | Status: AC
  Administered 2024-05-20: 5 mg via ORAL
  Filled 2024-05-20: qty 1

## 2024-05-20 NOTE — Progress Notes (Signed)
 Upon leaving facility she rates her pain is 4/10 now.

## 2024-05-20 NOTE — Progress Notes (Signed)
 [name] wants to continue the Faslodex  weekly, but was told by Hospice nurse that they will not cover it. Spoke w/managed care to see if [employer] would cover it, but was informed that [employer] will not trump primary coverage. They always follow the primary payer.  Called [facility] and left VM with her case manager requesting an official override to allow her to continue this medication.

## 2024-05-20 NOTE — Progress Notes (Signed)
 [facility] OFFICE PROGRESS NOTE   Diagnosis: Breast cancer  INTERVAL HISTORY:   [name] returns as scheduled.  She has intermittent back pain.  She now lives in an assisted living facility.  She reports this is working out well though she does not like the food.  She has enrolled in hospice care.  She would like to continue Faslodex  if her insurance will cover the cost.  Objective:  Vital signs in last 24 hours:  Blood pressure (!) 123/59, pulse 60, temperature 97.8 F (36.6 C), temperature source Temporal, resp. rate 18, height 5' 8 (1.727 m), weight 97 lb 9.6 oz (44.3 kg), last menstrual period 10/07/1974, SpO2 100%.   Lymphatics: No left axillary nodes Resp: Lungs clear bilaterally Cardio: Regular rate and rhythm GI: No hepatosplenomegaly Vascular: No leg edema Breast: 1 cm mobile mass at the upper outer left breast   Lab Results:  Lab Results  Component Value Date   WBC 12.0 (H) 05/20/2024   HGB 10.6 (L) 05/20/2024   HCT 33.4 (L) 05/20/2024   MCV 93.3 05/20/2024   PLT 319 05/20/2024   NEUTROABS 9.4 (H) 05/20/2024    CMP  Lab Results  Component Value Date   NA 143 05/20/2024   K 4.2 05/20/2024   CL 109 05/20/2024   CO2 21 (L) 05/20/2024   GLUCOSE 142 (H) 05/20/2024   BUN 36 (H) 05/20/2024   CREATININE 1.97 (H) 05/20/2024   CALCIUM  9.3 05/20/2024   PROT 6.7 05/20/2024   ALBUMIN  3.8 05/20/2024   AST 47 (H) 05/20/2024   ALT 47 (H) 05/20/2024   ALKPHOS 155 (H) 05/20/2024   BILITOT 0.6 05/20/2024   GFRNONAA 24 (L) 05/20/2024   GFRAA 44 (L) 06/07/2019    Lab Results  Component Value Date   CA125 12 07/15/2016    Lab Results  Component Value Date   INR 1.3 (H) 04/23/2024   LABPROT 16.7 (H) 04/23/2024    Imaging:  No results found.  Medications: I have reviewed the patient's current medications.   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 Remote hysterectomy and bilateral salpingo-oophorectomy with the cytology from 1976 confirming numerous psammoma bodies. 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  Status post a needle localized lumpectomy and sentinel lymph node biopsy 04/13/2009 with the pathology confirming high-grade ductal carcinoma in situ with an associated 0.26 mm invasive carcinoma, ER positive, PR positive, and HER2 positive. Status post adjuvant right breast radiation completed 06/19/2009. Initiation of Arimidex  05/01/2009.  Completed 5 years in July 2015 Family history of breast cancer. History of Clostridium difficile colitis. Frequent bowel movements following the bowel resection in 2009. 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 CTs 10/16/2023-innumerable lung nodules both lungs; multiple new ill-defined low-density liver lesions; bony metastatic disease in the thoracic and lumbar spine.  Severe compression fracture  at T11 with osseous retropulsion.  Small right greater than left pleural effusions. Biopsy liver lesion 10/30/2023-metastatic moderate to poorly differentiated adenocarcinoma consistent with breast origin; HER2 negative (1+), ER +100%, PR +30%, Ki-67 60%, ESR 1 nutation positive, PIK3CA  negative Faslodex /Abemaciclib  11/07/2023, Abemaciclib  discontinued on hospital admission 11/21/2023 Faslodex  12/12/2023, 12/26/2023, 01/08/2024, 02/26/2024, 03/26/2024, 04/22/2024 CT 03/25/2024-significant decrease in size and number of diffuse millimetric pulmonary nodules/metastasis.  Marked improvement in appearance of hepatic metastasis.  Superior left breast or left chest wall nodule new since 10/16/2023. Faslodex  continued CT chest 04/22/2024-stable parenchymal nodules, progressive bilateral lower lobe infiltrates/effusions CTs without contrast 05/03/2024-bilateral pleural effusions right greater than left.  Persistent lower lobe consolidation.  Scattered small nodules seen, stable.  Hypodense lesion within the liver incompletely evaluated due to lack of IV contrast.  Healed metastatic disease noted in the lower thoracic spine, stable metastatic disease in the L4 spinous process. 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  Admission 11/21/2023 with acute renal failure, elevated liver enzymes, dehydration, and severe back pain Renal failure and elevated liver enzymes improved, discharged 12/04/2023 13.  Anemia/thrombocytopenia-likely secondary to Abemaciclib , metastatic breast cancer involving the bone marrow, and radiation 14.  Admission 12/05/2023 with chest pain and rapid atrial fibrillation 15.  Admission 12/23/2023 with rapid atrial fibrillation and CHF 16.  Anemia secondary to chronic disease, renal failure, metastatic breast cancer involving the bone marrow, phlebotomy,  and chemotherapy/radiation 17.  Lower leg/foot edema with skin breakdown 02/26/2024-improved 03/26/2024 18.  Hypomagnesemia-oral magnesium  initiated 02/27/2024 19.  Admission 04/08/2024 through 04/12/2024 with rapid atrial fibrillation, CHF, pneumonia\ 20.  Admission 04/22/2024 with hyperkalemia and bradycardia-emergent hemodialysis 21.  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  Bilateral pneumonia 04/25/2024    Disposition: [name] has metastatic breast cancer.  She was last treated with Faslodex  on 04/22/2024.  There is no clinical evidence of disease progression.  She has enrolled in hospice care.  She would like to continue Faslodex  if her insurance will cover the cost.  She has multiple comorbid conditions including renal failure, atrial fibrillation, and congestive heart failure.  She qualifies for hospice care based on multiple medical conditions.  We will check into insurance payment for Faslodex  with the plan to administer Faslodex  within the next week if this can be paid for.  She will return for an office visit in 4-5 weeks.  Arley Hof, MD  05/20/2024  3:18 PM

## 2024-05-21 ENCOUNTER — Ambulatory Visit

## 2024-05-21 ENCOUNTER — Ambulatory Visit: Admitting: Oncology

## 2024-05-21 ENCOUNTER — Other Ambulatory Visit

## 2024-05-21 LAB — CANCER ANTIGEN 27.29: CA 27.29: 27.7 U/mL (ref 0.0–38.6)

## 2024-05-24 ENCOUNTER — Telehealth: Payer: Self-pay | Admitting: *Deleted

## 2024-05-24 DIAGNOSIS — I5033 Acute on chronic diastolic (congestive) heart failure: Secondary | ICD-10-CM | POA: Diagnosis not present

## 2024-05-24 DIAGNOSIS — E43 Unspecified severe protein-calorie malnutrition: Secondary | ICD-10-CM | POA: Diagnosis not present

## 2024-05-24 DIAGNOSIS — I130 Hypertensive heart and chronic kidney disease with heart failure and stage 1 through stage 4 chronic kidney disease, or unspecified chronic kidney disease: Secondary | ICD-10-CM | POA: Diagnosis not present

## 2024-05-24 DIAGNOSIS — C50919 Malignant neoplasm of unspecified site of unspecified female breast: Secondary | ICD-10-CM | POA: Diagnosis not present

## 2024-05-24 DIAGNOSIS — Z8543 Personal history of malignant neoplasm of ovary: Secondary | ICD-10-CM | POA: Diagnosis not present

## 2024-05-24 DIAGNOSIS — N1832 Chronic kidney disease, stage 3b: Secondary | ICD-10-CM | POA: Diagnosis not present

## 2024-05-24 DIAGNOSIS — E559 Vitamin D deficiency, unspecified: Secondary | ICD-10-CM | POA: Diagnosis not present

## 2024-05-24 DIAGNOSIS — Z7901 Long term (current) use of anticoagulants: Secondary | ICD-10-CM | POA: Diagnosis not present

## 2024-05-24 DIAGNOSIS — E876 Hypokalemia: Secondary | ICD-10-CM | POA: Diagnosis not present

## 2024-05-24 DIAGNOSIS — I2510 Atherosclerotic heart disease of native coronary artery without angina pectoris: Secondary | ICD-10-CM | POA: Diagnosis not present

## 2024-05-24 DIAGNOSIS — I4811 Longstanding persistent atrial fibrillation: Secondary | ICD-10-CM | POA: Diagnosis not present

## 2024-05-24 DIAGNOSIS — E782 Mixed hyperlipidemia: Secondary | ICD-10-CM | POA: Diagnosis not present

## 2024-05-24 NOTE — Telephone Encounter (Signed)
 Called [facility] and spoke with case manager, Sela to f/u on request for override for Faslodex . She took information and will discuss with medical director and call back.

## 2024-05-25 ENCOUNTER — Telehealth: Payer: Self-pay | Admitting: *Deleted

## 2024-05-25 DIAGNOSIS — Z0000 Encounter for general adult medical examination without abnormal findings: Secondary | ICD-10-CM | POA: Diagnosis not present

## 2024-05-25 DIAGNOSIS — K219 Gastro-esophageal reflux disease without esophagitis: Secondary | ICD-10-CM | POA: Diagnosis not present

## 2024-05-25 DIAGNOSIS — I5033 Acute on chronic diastolic (congestive) heart failure: Secondary | ICD-10-CM | POA: Diagnosis not present

## 2024-05-25 DIAGNOSIS — I130 Hypertensive heart and chronic kidney disease with heart failure and stage 1 through stage 4 chronic kidney disease, or unspecified chronic kidney disease: Secondary | ICD-10-CM | POA: Diagnosis not present

## 2024-05-25 DIAGNOSIS — F5101 Primary insomnia: Secondary | ICD-10-CM | POA: Diagnosis not present

## 2024-05-25 NOTE — Telephone Encounter (Signed)
 Patient left VM requesting status of the Faslodex  injection and cost. Attempted to reach her to inform her that Hospice Case Manager is investigating an override for Hospice to cover it. Awaiting her return call.

## 2024-05-26 DIAGNOSIS — F320 Major depressive disorder, single episode, mild: Secondary | ICD-10-CM | POA: Diagnosis not present

## 2024-05-26 DIAGNOSIS — Z0000 Encounter for general adult medical examination without abnormal findings: Secondary | ICD-10-CM | POA: Diagnosis not present

## 2024-05-26 DIAGNOSIS — M6281 Muscle weakness (generalized): Secondary | ICD-10-CM | POA: Diagnosis not present

## 2024-05-26 DIAGNOSIS — G2581 Restless legs syndrome: Secondary | ICD-10-CM | POA: Diagnosis not present

## 2024-05-26 DIAGNOSIS — M6259 Muscle wasting and atrophy, not elsewhere classified, multiple sites: Secondary | ICD-10-CM | POA: Diagnosis not present

## 2024-05-27 DIAGNOSIS — I4891 Unspecified atrial fibrillation: Secondary | ICD-10-CM | POA: Diagnosis not present

## 2024-05-27 DIAGNOSIS — C7951 Secondary malignant neoplasm of bone: Secondary | ICD-10-CM | POA: Diagnosis not present

## 2024-05-27 DIAGNOSIS — I10 Essential (primary) hypertension: Secondary | ICD-10-CM | POA: Diagnosis not present

## 2024-05-27 DIAGNOSIS — C787 Secondary malignant neoplasm of liver and intrahepatic bile duct: Secondary | ICD-10-CM | POA: Diagnosis not present

## 2024-05-27 DIAGNOSIS — C7800 Secondary malignant neoplasm of unspecified lung: Secondary | ICD-10-CM | POA: Diagnosis not present

## 2024-05-27 DIAGNOSIS — C50912 Malignant neoplasm of unspecified site of left female breast: Secondary | ICD-10-CM | POA: Diagnosis not present

## 2024-05-28 ENCOUNTER — Telehealth: Payer: Self-pay | Admitting: Oncology

## 2024-05-28 DIAGNOSIS — C787 Secondary malignant neoplasm of liver and intrahepatic bile duct: Secondary | ICD-10-CM | POA: Diagnosis not present

## 2024-05-28 DIAGNOSIS — C7800 Secondary malignant neoplasm of unspecified lung: Secondary | ICD-10-CM | POA: Diagnosis not present

## 2024-05-28 DIAGNOSIS — I10 Essential (primary) hypertension: Secondary | ICD-10-CM | POA: Diagnosis not present

## 2024-05-28 DIAGNOSIS — C7951 Secondary malignant neoplasm of bone: Secondary | ICD-10-CM | POA: Diagnosis not present

## 2024-05-28 DIAGNOSIS — I4891 Unspecified atrial fibrillation: Secondary | ICD-10-CM | POA: Diagnosis not present

## 2024-05-28 DIAGNOSIS — C50912 Malignant neoplasm of unspecified site of left female breast: Secondary | ICD-10-CM | POA: Diagnosis not present

## 2024-05-28 DIAGNOSIS — E785 Hyperlipidemia, unspecified: Secondary | ICD-10-CM | POA: Diagnosis not present

## 2024-05-28 NOTE — Telephone Encounter (Signed)
 PT called to schedule injection, was having issue with insurance. Sent message to nurse and let her know I would call back with day and time.

## 2024-05-28 NOTE — Telephone Encounter (Signed)
 Calling to schedule injection appt. Appt scheduled day and time confirmed.

## 2024-06-01 NOTE — Telephone Encounter (Signed)
 Called and left VM for case manager to please f/u on medical director decision on paying for Faslodex .

## 2024-06-02 DIAGNOSIS — C787 Secondary malignant neoplasm of liver and intrahepatic bile duct: Secondary | ICD-10-CM | POA: Diagnosis not present

## 2024-06-02 DIAGNOSIS — C7951 Secondary malignant neoplasm of bone: Secondary | ICD-10-CM | POA: Diagnosis not present

## 2024-06-02 DIAGNOSIS — I4891 Unspecified atrial fibrillation: Secondary | ICD-10-CM | POA: Diagnosis not present

## 2024-06-02 DIAGNOSIS — C50912 Malignant neoplasm of unspecified site of left female breast: Secondary | ICD-10-CM | POA: Diagnosis not present

## 2024-06-02 DIAGNOSIS — I10 Essential (primary) hypertension: Secondary | ICD-10-CM | POA: Diagnosis not present

## 2024-06-02 DIAGNOSIS — C7800 Secondary malignant neoplasm of unspecified lung: Secondary | ICD-10-CM | POA: Diagnosis not present

## 2024-06-03 DIAGNOSIS — I4891 Unspecified atrial fibrillation: Secondary | ICD-10-CM | POA: Diagnosis not present

## 2024-06-03 DIAGNOSIS — C7951 Secondary malignant neoplasm of bone: Secondary | ICD-10-CM | POA: Diagnosis not present

## 2024-06-03 DIAGNOSIS — I10 Essential (primary) hypertension: Secondary | ICD-10-CM | POA: Diagnosis not present

## 2024-06-03 DIAGNOSIS — C7800 Secondary malignant neoplasm of unspecified lung: Secondary | ICD-10-CM | POA: Diagnosis not present

## 2024-06-03 DIAGNOSIS — C787 Secondary malignant neoplasm of liver and intrahepatic bile duct: Secondary | ICD-10-CM | POA: Diagnosis not present

## 2024-06-03 DIAGNOSIS — C50912 Malignant neoplasm of unspecified site of left female breast: Secondary | ICD-10-CM | POA: Diagnosis not present

## 2024-06-03 NOTE — Telephone Encounter (Signed)
 Notified by case manager that Hospice has agreed to override and will cover the Faslodex  now. Notified managed care as well.

## 2024-06-04 ENCOUNTER — Inpatient Hospital Stay

## 2024-06-04 VITALS — BP 147/46 | HR 61 | Temp 97.7°F | Resp 18

## 2024-06-04 DIAGNOSIS — C7951 Secondary malignant neoplasm of bone: Secondary | ICD-10-CM | POA: Diagnosis not present

## 2024-06-04 DIAGNOSIS — Z5111 Encounter for antineoplastic chemotherapy: Secondary | ICD-10-CM | POA: Diagnosis not present

## 2024-06-04 DIAGNOSIS — C787 Secondary malignant neoplasm of liver and intrahepatic bile duct: Secondary | ICD-10-CM | POA: Diagnosis not present

## 2024-06-04 DIAGNOSIS — C7802 Secondary malignant neoplasm of left lung: Secondary | ICD-10-CM | POA: Diagnosis not present

## 2024-06-04 DIAGNOSIS — C50912 Malignant neoplasm of unspecified site of left female breast: Secondary | ICD-10-CM | POA: Diagnosis not present

## 2024-06-04 DIAGNOSIS — I4891 Unspecified atrial fibrillation: Secondary | ICD-10-CM | POA: Diagnosis not present

## 2024-06-04 DIAGNOSIS — C7800 Secondary malignant neoplasm of unspecified lung: Secondary | ICD-10-CM | POA: Diagnosis not present

## 2024-06-04 DIAGNOSIS — C7801 Secondary malignant neoplasm of right lung: Secondary | ICD-10-CM | POA: Diagnosis not present

## 2024-06-04 DIAGNOSIS — I10 Essential (primary) hypertension: Secondary | ICD-10-CM | POA: Diagnosis not present

## 2024-06-04 DIAGNOSIS — C50412 Malignant neoplasm of upper-outer quadrant of left female breast: Secondary | ICD-10-CM

## 2024-06-04 MED ORDER — FULVESTRANT 250 MG/5ML IM SOSY
500.0000 mg | PREFILLED_SYRINGE | Freq: Once | INTRAMUSCULAR | Status: AC
  Administered 2024-06-04: 500 mg via INTRAMUSCULAR
  Filled 2024-06-04: qty 10

## 2024-06-04 NOTE — Patient Instructions (Signed)
 [facility] - A DEPT OF . [facility]  Discharge Instructions: Thank you for choosing [facility] to provide your oncology and hematology care.   If you have a lab appointment with the Cancer Center, please go directly to the Cancer Center and check in at the registration area.   Wear comfortable clothing and clothing appropriate for easy access to any Portacath or PICC line.   We strive to give you quality time with your provider. You may need to reschedule your appointment if you arrive late (15 or more minutes).  Arriving late affects you and other patients whose appointments are after yours.  Also, if you miss three or more appointments without notifying the office, you may be dismissed from the clinic at the provider's discretion.      For prescription refill requests, have your pharmacy contact our office and allow 72 hours for refills to be completed.    Today you received the following chemotherapy and/or immunotherapy agents: faslodex       To help prevent nausea and vomiting after your treatment, we encourage you to take your nausea medication as directed.  BELOW ARE SYMPTOMS THAT SHOULD BE REPORTED IMMEDIATELY: *FEVER GREATER THAN 100.4 F (38 C) OR HIGHER *CHILLS OR SWEATING *NAUSEA AND VOMITING THAT IS NOT CONTROLLED WITH YOUR NAUSEA MEDICATION *UNUSUAL SHORTNESS OF BREATH *UNUSUAL BRUISING OR BLEEDING *URINARY PROBLEMS (pain or burning when urinating, or frequent urination) *BOWEL PROBLEMS (unusual diarrhea, constipation, pain near the anus) TENDERNESS IN MOUTH AND THROAT WITH OR WITHOUT PRESENCE OF ULCERS (sore throat, sores in mouth, or a toothache) UNUSUAL RASH, SWELLING OR PAIN  UNUSUAL VAGINAL DISCHARGE OR ITCHING   Items with * indicate a potential emergency and should be followed up as soon as possible or go to the Emergency Department if any problems should occur.  Please show the CHEMOTHERAPY ALERT CARD or IMMUNOTHERAPY  ALERT CARD at check-in to the Emergency Department and triage nurse.  Should you have questions after your visit or need to cancel or reschedule your appointment, please contact [facility] - A DEPT OF [facility]  Dept: [phone]  and follow the prompts.  Office hours are 8:00 a.m. to 4:30 p.m. Monday - Friday. Please note that voicemails left after 4:00 p.m. may not be returned until the following business day.  We are closed weekends and major holidays. You have access to a nurse at all times for urgent questions. Please call the main number to the clinic Dept: [phone] and follow the prompts.   For any non-urgent questions, you may also contact your provider using MyChart. We now offer e-Visits for anyone 15 and older to request care online for non-urgent symptoms. For details visit [email or web address].   Also download the MyChart app! Go to the app store, search MyChart, open the app, select [place], and log in with your MyChart username and password.

## 2024-06-07 DIAGNOSIS — I4891 Unspecified atrial fibrillation: Secondary | ICD-10-CM | POA: Diagnosis not present

## 2024-06-07 DIAGNOSIS — C787 Secondary malignant neoplasm of liver and intrahepatic bile duct: Secondary | ICD-10-CM | POA: Diagnosis not present

## 2024-06-07 DIAGNOSIS — I5032 Chronic diastolic (congestive) heart failure: Secondary | ICD-10-CM | POA: Diagnosis not present

## 2024-06-07 DIAGNOSIS — K219 Gastro-esophageal reflux disease without esophagitis: Secondary | ICD-10-CM | POA: Diagnosis not present

## 2024-06-07 DIAGNOSIS — C7951 Secondary malignant neoplasm of bone: Secondary | ICD-10-CM | POA: Diagnosis not present

## 2024-06-07 DIAGNOSIS — C50912 Malignant neoplasm of unspecified site of left female breast: Secondary | ICD-10-CM | POA: Diagnosis not present

## 2024-06-07 DIAGNOSIS — N184 Chronic kidney disease, stage 4 (severe): Secondary | ICD-10-CM | POA: Diagnosis not present

## 2024-06-07 DIAGNOSIS — D649 Anemia, unspecified: Secondary | ICD-10-CM | POA: Diagnosis not present

## 2024-06-07 DIAGNOSIS — C7800 Secondary malignant neoplasm of unspecified lung: Secondary | ICD-10-CM | POA: Diagnosis not present

## 2024-06-07 DIAGNOSIS — S22080A Wedge compression fracture of T11-T12 vertebra, initial encounter for closed fracture: Secondary | ICD-10-CM | POA: Diagnosis not present

## 2024-06-07 DIAGNOSIS — I10 Essential (primary) hypertension: Secondary | ICD-10-CM | POA: Diagnosis not present

## 2024-06-07 DIAGNOSIS — I2510 Atherosclerotic heart disease of native coronary artery without angina pectoris: Secondary | ICD-10-CM | POA: Diagnosis not present

## 2024-06-08 DIAGNOSIS — C7800 Secondary malignant neoplasm of unspecified lung: Secondary | ICD-10-CM | POA: Diagnosis not present

## 2024-06-08 DIAGNOSIS — I4811 Longstanding persistent atrial fibrillation: Secondary | ICD-10-CM | POA: Diagnosis not present

## 2024-06-08 DIAGNOSIS — M069 Rheumatoid arthritis, unspecified: Secondary | ICD-10-CM | POA: Diagnosis not present

## 2024-06-08 DIAGNOSIS — C787 Secondary malignant neoplasm of liver and intrahepatic bile duct: Secondary | ICD-10-CM | POA: Diagnosis not present

## 2024-06-08 DIAGNOSIS — I4891 Unspecified atrial fibrillation: Secondary | ICD-10-CM | POA: Diagnosis not present

## 2024-06-08 DIAGNOSIS — N1832 Chronic kidney disease, stage 3b: Secondary | ICD-10-CM | POA: Diagnosis not present

## 2024-06-08 DIAGNOSIS — C50912 Malignant neoplasm of unspecified site of left female breast: Secondary | ICD-10-CM | POA: Diagnosis not present

## 2024-06-08 DIAGNOSIS — I10 Essential (primary) hypertension: Secondary | ICD-10-CM | POA: Diagnosis not present

## 2024-06-08 DIAGNOSIS — I130 Hypertensive heart and chronic kidney disease with heart failure and stage 1 through stage 4 chronic kidney disease, or unspecified chronic kidney disease: Secondary | ICD-10-CM | POA: Diagnosis not present

## 2024-06-08 DIAGNOSIS — Z7901 Long term (current) use of anticoagulants: Secondary | ICD-10-CM | POA: Diagnosis not present

## 2024-06-08 DIAGNOSIS — C7951 Secondary malignant neoplasm of bone: Secondary | ICD-10-CM | POA: Diagnosis not present

## 2024-06-08 DIAGNOSIS — I5033 Acute on chronic diastolic (congestive) heart failure: Secondary | ICD-10-CM | POA: Diagnosis not present

## 2024-06-14 DIAGNOSIS — N6002 Solitary cyst of left breast: Secondary | ICD-10-CM | POA: Diagnosis not present

## 2024-06-14 DIAGNOSIS — N184 Chronic kidney disease, stage 4 (severe): Secondary | ICD-10-CM | POA: Diagnosis not present

## 2024-06-14 DIAGNOSIS — R928 Other abnormal and inconclusive findings on diagnostic imaging of breast: Secondary | ICD-10-CM | POA: Diagnosis not present

## 2024-06-15 DIAGNOSIS — I10 Essential (primary) hypertension: Secondary | ICD-10-CM | POA: Diagnosis not present

## 2024-06-15 DIAGNOSIS — C50912 Malignant neoplasm of unspecified site of left female breast: Secondary | ICD-10-CM | POA: Diagnosis not present

## 2024-06-15 DIAGNOSIS — C787 Secondary malignant neoplasm of liver and intrahepatic bile duct: Secondary | ICD-10-CM | POA: Diagnosis not present

## 2024-06-15 DIAGNOSIS — C7800 Secondary malignant neoplasm of unspecified lung: Secondary | ICD-10-CM | POA: Diagnosis not present

## 2024-06-15 DIAGNOSIS — C7951 Secondary malignant neoplasm of bone: Secondary | ICD-10-CM | POA: Diagnosis not present

## 2024-06-15 DIAGNOSIS — I4891 Unspecified atrial fibrillation: Secondary | ICD-10-CM | POA: Diagnosis not present

## 2024-06-16 DIAGNOSIS — I10 Essential (primary) hypertension: Secondary | ICD-10-CM | POA: Diagnosis not present

## 2024-06-16 DIAGNOSIS — C7800 Secondary malignant neoplasm of unspecified lung: Secondary | ICD-10-CM | POA: Diagnosis not present

## 2024-06-16 DIAGNOSIS — I4891 Unspecified atrial fibrillation: Secondary | ICD-10-CM | POA: Diagnosis not present

## 2024-06-16 DIAGNOSIS — C7951 Secondary malignant neoplasm of bone: Secondary | ICD-10-CM | POA: Diagnosis not present

## 2024-06-16 DIAGNOSIS — C787 Secondary malignant neoplasm of liver and intrahepatic bile duct: Secondary | ICD-10-CM | POA: Diagnosis not present

## 2024-06-16 DIAGNOSIS — C50912 Malignant neoplasm of unspecified site of left female breast: Secondary | ICD-10-CM | POA: Diagnosis not present

## 2024-06-17 DIAGNOSIS — C50912 Malignant neoplasm of unspecified site of left female breast: Secondary | ICD-10-CM | POA: Diagnosis not present

## 2024-06-17 DIAGNOSIS — I4891 Unspecified atrial fibrillation: Secondary | ICD-10-CM | POA: Diagnosis not present

## 2024-06-17 DIAGNOSIS — C787 Secondary malignant neoplasm of liver and intrahepatic bile duct: Secondary | ICD-10-CM | POA: Diagnosis not present

## 2024-06-17 DIAGNOSIS — C7800 Secondary malignant neoplasm of unspecified lung: Secondary | ICD-10-CM | POA: Diagnosis not present

## 2024-06-17 DIAGNOSIS — I10 Essential (primary) hypertension: Secondary | ICD-10-CM | POA: Diagnosis not present

## 2024-06-17 DIAGNOSIS — C7951 Secondary malignant neoplasm of bone: Secondary | ICD-10-CM | POA: Diagnosis not present

## 2024-06-18 ENCOUNTER — Encounter: Payer: Self-pay | Admitting: Oncology

## 2024-06-22 DIAGNOSIS — C7800 Secondary malignant neoplasm of unspecified lung: Secondary | ICD-10-CM | POA: Diagnosis not present

## 2024-06-22 DIAGNOSIS — I4891 Unspecified atrial fibrillation: Secondary | ICD-10-CM | POA: Diagnosis not present

## 2024-06-22 DIAGNOSIS — C50912 Malignant neoplasm of unspecified site of left female breast: Secondary | ICD-10-CM | POA: Diagnosis not present

## 2024-06-22 DIAGNOSIS — I10 Essential (primary) hypertension: Secondary | ICD-10-CM | POA: Diagnosis not present

## 2024-06-22 DIAGNOSIS — C787 Secondary malignant neoplasm of liver and intrahepatic bile duct: Secondary | ICD-10-CM | POA: Diagnosis not present

## 2024-06-22 DIAGNOSIS — C7951 Secondary malignant neoplasm of bone: Secondary | ICD-10-CM | POA: Diagnosis not present

## 2024-06-24 DIAGNOSIS — C787 Secondary malignant neoplasm of liver and intrahepatic bile duct: Secondary | ICD-10-CM | POA: Diagnosis not present

## 2024-06-24 DIAGNOSIS — C50912 Malignant neoplasm of unspecified site of left female breast: Secondary | ICD-10-CM | POA: Diagnosis not present

## 2024-06-24 DIAGNOSIS — I10 Essential (primary) hypertension: Secondary | ICD-10-CM | POA: Diagnosis not present

## 2024-06-24 DIAGNOSIS — I4891 Unspecified atrial fibrillation: Secondary | ICD-10-CM | POA: Diagnosis not present

## 2024-06-24 DIAGNOSIS — C7951 Secondary malignant neoplasm of bone: Secondary | ICD-10-CM | POA: Diagnosis not present

## 2024-06-24 DIAGNOSIS — F411 Generalized anxiety disorder: Secondary | ICD-10-CM | POA: Diagnosis not present

## 2024-06-24 DIAGNOSIS — G2581 Restless legs syndrome: Secondary | ICD-10-CM | POA: Diagnosis not present

## 2024-06-24 DIAGNOSIS — F329 Major depressive disorder, single episode, unspecified: Secondary | ICD-10-CM | POA: Diagnosis not present

## 2024-06-24 DIAGNOSIS — C7800 Secondary malignant neoplasm of unspecified lung: Secondary | ICD-10-CM | POA: Diagnosis not present

## 2024-06-24 DIAGNOSIS — M159 Polyosteoarthritis, unspecified: Secondary | ICD-10-CM | POA: Diagnosis not present

## 2024-06-25 ENCOUNTER — Ambulatory Visit: Admitting: Oncology

## 2024-06-25 ENCOUNTER — Inpatient Hospital Stay: Attending: Oncology

## 2024-06-25 ENCOUNTER — Encounter: Payer: Self-pay | Admitting: Nurse Practitioner

## 2024-06-25 ENCOUNTER — Ambulatory Visit

## 2024-06-25 ENCOUNTER — Telehealth: Payer: Self-pay

## 2024-06-25 ENCOUNTER — Inpatient Hospital Stay (HOSPITAL_BASED_OUTPATIENT_CLINIC_OR_DEPARTMENT_OTHER): Admitting: Nurse Practitioner

## 2024-06-25 ENCOUNTER — Inpatient Hospital Stay

## 2024-06-25 VITALS — BP 158/56 | HR 64 | Temp 98.1°F | Resp 18 | Ht 68.0 in | Wt 100.9 lb

## 2024-06-25 DIAGNOSIS — C50912 Malignant neoplasm of unspecified site of left female breast: Secondary | ICD-10-CM | POA: Diagnosis not present

## 2024-06-25 DIAGNOSIS — C787 Secondary malignant neoplasm of liver and intrahepatic bile duct: Secondary | ICD-10-CM | POA: Insufficient documentation

## 2024-06-25 DIAGNOSIS — Z1501 Genetic susceptibility to malignant neoplasm of breast: Secondary | ICD-10-CM | POA: Diagnosis not present

## 2024-06-25 DIAGNOSIS — Z170 Estrogen receptor positive status [ER+]: Secondary | ICD-10-CM | POA: Insufficient documentation

## 2024-06-25 DIAGNOSIS — C50919 Malignant neoplasm of unspecified site of unspecified female breast: Secondary | ICD-10-CM

## 2024-06-25 DIAGNOSIS — Z1509 Genetic susceptibility to other malignant neoplasm: Secondary | ICD-10-CM | POA: Insufficient documentation

## 2024-06-25 DIAGNOSIS — C7951 Secondary malignant neoplasm of bone: Secondary | ICD-10-CM | POA: Insufficient documentation

## 2024-06-25 DIAGNOSIS — Z5111 Encounter for antineoplastic chemotherapy: Secondary | ICD-10-CM | POA: Insufficient documentation

## 2024-06-25 DIAGNOSIS — C50412 Malignant neoplasm of upper-outer quadrant of left female breast: Secondary | ICD-10-CM

## 2024-06-25 LAB — CMP (CANCER CENTER ONLY)
ALT: 21 U/L (ref 0–44)
AST: 24 U/L (ref 15–41)
Albumin: 4.2 g/dL (ref 3.5–5.0)
Alkaline Phosphatase: 131 U/L — ABNORMAL HIGH (ref 38–126)
Anion gap: 13 (ref 5–15)
BUN: 37 mg/dL — ABNORMAL HIGH (ref 8–23)
CO2: 22 mmol/L (ref 22–32)
Calcium: 9.9 mg/dL (ref 8.9–10.3)
Chloride: 107 mmol/L (ref 98–111)
Creatinine: 1.86 mg/dL — ABNORMAL HIGH (ref 0.44–1.00)
GFR, Estimated: 26 mL/min — ABNORMAL LOW (ref >60)
Glucose, Bld: 104 mg/dL — ABNORMAL HIGH (ref 70–99)
Potassium: 4.5 mmol/L (ref 3.5–5.1)
Sodium: 142 mmol/L (ref 135–145)
Total Bilirubin: 0.4 mg/dL (ref 0.0–1.2)
Total Protein: 7.2 g/dL (ref 6.5–8.1)

## 2024-06-25 LAB — CBC WITH DIFFERENTIAL (CANCER CENTER ONLY)
Abs Immature Granulocytes: 0.09 K/uL — ABNORMAL HIGH (ref 0.00–0.07)
Basophils Absolute: 0.1 K/uL (ref 0.0–0.1)
Basophils Relative: 1 %
Eosinophils Absolute: 0.3 K/uL (ref 0.0–0.5)
Eosinophils Relative: 3 %
HCT: 34.1 % — ABNORMAL LOW (ref 36.0–46.0)
Hemoglobin: 11.1 g/dL — ABNORMAL LOW (ref 12.0–15.0)
Immature Granulocytes: 1 %
Lymphocytes Relative: 10 %
Lymphs Abs: 1.1 K/uL (ref 0.7–4.0)
MCH: 30.8 pg (ref 26.0–34.0)
MCHC: 32.6 g/dL (ref 30.0–36.0)
MCV: 94.7 fL (ref 80.0–100.0)
Monocytes Absolute: 0.8 K/uL (ref 0.1–1.0)
Monocytes Relative: 7 %
Neutro Abs: 7.9 K/uL — ABNORMAL HIGH (ref 1.7–7.7)
Neutrophils Relative %: 78 %
Platelet Count: 272 K/uL (ref 150–400)
RBC: 3.6 MIL/uL — ABNORMAL LOW (ref 3.87–5.11)
RDW: 15.8 % — ABNORMAL HIGH (ref 11.5–15.5)
WBC Count: 10.2 K/uL (ref 4.0–10.5)
nRBC: 0 % (ref 0.0–0.2)

## 2024-06-25 LAB — MAGNESIUM: Magnesium: 1.8 mg/dL (ref 1.7–2.4)

## 2024-06-25 NOTE — Telephone Encounter (Signed)
 I contacted [name] to determine if the patient qualifies for transportation through [facility]. He confirmed that he will follow up with the patient.

## 2024-06-25 NOTE — Progress Notes (Addendum)
 [facility] OFFICE PROGRESS NOTE   Diagnosis: Breast cancer  INTERVAL HISTORY:   [name] returns as scheduled.  She reports pain is generally not a problem except with prolonged standing.  The pain is typically located at the low back.  She occasionally takes a pain pill.  She has a good appetite.  She reports she is gaining weight.  No nausea or vomiting.  No abdominal pain.  No diarrhea or constipation.  She reports a recent mammogram showing a cyst in the left breast.  She does not plan additional evaluation.  Objective:  Vital signs in last 24 hours:  Blood pressure (!) 158/56, pulse 64, temperature 98.1 F (36.7 C), temperature source Temporal, resp. rate 18, height 5' 8 (1.727 m), weight 100 lb 14.4 oz (45.8 kg), last menstrual period 10/07/1974, SpO2 100%.    HEENT: No thrush or ulcers. Lymphatics: No palpable cervical, supraclavicular or axillary lymph nodes. Resp: Lungs clear bilaterally. Cardio: Regular rate and rhythm. GI: No hepatosplenomegaly.  Nontender. Vascular: No leg edema. Breast: Less than 1 cm mobile mass upper outer left breast.   Lab Results:  Lab Results  Component Value Date   WBC 10.2 06/25/2024   HGB 11.1 (L) 06/25/2024   HCT 34.1 (L) 06/25/2024   MCV 94.7 06/25/2024   PLT 272 06/25/2024   NEUTROABS 7.9 (H) 06/25/2024    Imaging:  No results found.  Medications: I have reviewed the patient's current medications.  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 Remote hysterectomy and bilateral salpingo-oophorectomy with the cytology from 1976 confirming numerous psammoma bodies. 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  Status post a needle localized lumpectomy and sentinel lymph node biopsy 04/13/2009 with the pathology confirming high-grade ductal carcinoma in situ with an associated 0.26 mm invasive carcinoma, ER positive, PR positive, and HER2 positive. Status post adjuvant right breast radiation completed 06/19/2009. Initiation of Arimidex  05/01/2009.  Completed 5 years in July 2015 Family history of breast cancer. History of Clostridium difficile colitis. Frequent bowel movements following the bowel resection in 2009. 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 CTs 10/16/2023-innumerable lung nodules both lungs; multiple new ill-defined low-density liver lesions; bony metastatic disease in the thoracic and lumbar spine.  Severe compression fracture at T11 with osseous retropulsion.  Small right greater than left pleural effusions. Biopsy liver lesion 10/30/2023-metastatic moderate to poorly differentiated adenocarcinoma consistent with breast origin; HER2 negative (1+), ER +100%, PR +30%, Ki-67 60%, ESR 1 nutation positive, PIK3CA negative Faslodex /Abemaciclib  11/07/2023, Abemaciclib  discontinued on hospital admission 11/21/2023 Faslodex  12/12/2023, 12/26/2023, 01/08/2024, 02/26/2024, 03/26/2024, 04/22/2024 CT 03/25/2024-significant decrease in size and number of diffuse millimetric pulmonary nodules/metastasis.  Marked improvement in appearance of hepatic metastasis.  Superior left breast or left chest wall nodule new since  10/16/2023. Faslodex  continued CT chest 04/22/2024-stable parenchymal nodules, progressive bilateral lower  lobe infiltrates/effusions CTs without contrast 05/03/2024-bilateral pleural effusions right greater than left.  Persistent lower lobe consolidation.  Scattered small nodules seen, stable.  Hypodense lesion within the liver incompletely evaluated due to lack of IV contrast.  Healed metastatic disease noted in the lower thoracic spine, stable metastatic disease in the L4 spinous process. 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  Admission 11/21/2023 with acute renal failure, elevated liver enzymes, dehydration, and severe back pain Renal failure and elevated liver enzymes improved, discharged 12/04/2023 13.  Anemia/thrombocytopenia-likely secondary to Abemaciclib , metastatic breast cancer involving the bone marrow, and radiation 14.  Admission 12/05/2023 with chest pain and rapid atrial fibrillation 15.  Admission 12/23/2023 with rapid atrial fibrillation and CHF 16.  Anemia secondary to chronic disease, renal failure, metastatic breast cancer involving the bone marrow, phlebotomy, and chemotherapy/radiation 17.  Lower leg/foot edema with skin breakdown 02/26/2024-improved 03/26/2024 18.  Hypomagnesemia-oral magnesium  initiated 02/27/2024 19.  Admission 04/08/2024 through 04/12/2024 with rapid atrial fibrillation, CHF, pneumonia\ 20.  Admission 04/22/2024 with hyperkalemia and bradycardia-emergent hemodialysis 21.  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   Bilateral pneumonia 04/25/2024 23.  Mammogram 06/14/2024-1.2 cm oval mass in the left superior breast, indeterminant.  Ultrasound showed 1 cm x 1.1 cm x 0.6 cm oval simple cyst in the upper outer left breast, appears benign.  Noted to be a slightly unusual location for a cyst, diagnostic aspiration could be performed.  06/25/2024-she declines further evaluation.    Disposition: [name] appears stable.  She has metastatic breast cancer.  She is being treated with Faslodex .  There is no clinical evidence of disease progression.  Plan to continue the same, next injection due in 1 week.  We will check the CA 27-29 at her next visit.  CBC and chemistry panel reviewed.  Hemoglobin is higher.  Creatinine is stable.  Magnesium  is in low normal range.  Recent mammogram/ultrasound with possible simple cyst in the upper outer left breast, benign appearance, but noted to be in a slightly unusual location.  She does not plan additional evaluation.    She will return for lab/follow-up/injection on 07/30/2024.  We are available to see her sooner if needed.  Olam Ned ANP/GNP-BC   06/25/2024  12:21 PM

## 2024-06-30 DIAGNOSIS — C7800 Secondary malignant neoplasm of unspecified lung: Secondary | ICD-10-CM | POA: Diagnosis not present

## 2024-06-30 DIAGNOSIS — C787 Secondary malignant neoplasm of liver and intrahepatic bile duct: Secondary | ICD-10-CM | POA: Diagnosis not present

## 2024-06-30 DIAGNOSIS — D631 Anemia in chronic kidney disease: Secondary | ICD-10-CM | POA: Diagnosis not present

## 2024-06-30 DIAGNOSIS — N184 Chronic kidney disease, stage 4 (severe): Secondary | ICD-10-CM | POA: Diagnosis not present

## 2024-06-30 DIAGNOSIS — I10 Essential (primary) hypertension: Secondary | ICD-10-CM | POA: Diagnosis not present

## 2024-06-30 DIAGNOSIS — I639 Cerebral infarction, unspecified: Secondary | ICD-10-CM | POA: Diagnosis not present

## 2024-06-30 DIAGNOSIS — C50912 Malignant neoplasm of unspecified site of left female breast: Secondary | ICD-10-CM | POA: Diagnosis not present

## 2024-06-30 DIAGNOSIS — E875 Hyperkalemia: Secondary | ICD-10-CM | POA: Diagnosis not present

## 2024-06-30 DIAGNOSIS — I4891 Unspecified atrial fibrillation: Secondary | ICD-10-CM | POA: Diagnosis not present

## 2024-06-30 DIAGNOSIS — I129 Hypertensive chronic kidney disease with stage 1 through stage 4 chronic kidney disease, or unspecified chronic kidney disease: Secondary | ICD-10-CM | POA: Diagnosis not present

## 2024-06-30 DIAGNOSIS — I509 Heart failure, unspecified: Secondary | ICD-10-CM | POA: Diagnosis not present

## 2024-06-30 DIAGNOSIS — E559 Vitamin D deficiency, unspecified: Secondary | ICD-10-CM | POA: Diagnosis not present

## 2024-06-30 DIAGNOSIS — E872 Acidosis, unspecified: Secondary | ICD-10-CM | POA: Diagnosis not present

## 2024-06-30 DIAGNOSIS — N179 Acute kidney failure, unspecified: Secondary | ICD-10-CM | POA: Diagnosis not present

## 2024-06-30 DIAGNOSIS — C7951 Secondary malignant neoplasm of bone: Secondary | ICD-10-CM | POA: Diagnosis not present

## 2024-07-01 DIAGNOSIS — C7951 Secondary malignant neoplasm of bone: Secondary | ICD-10-CM | POA: Diagnosis not present

## 2024-07-01 DIAGNOSIS — C787 Secondary malignant neoplasm of liver and intrahepatic bile duct: Secondary | ICD-10-CM | POA: Diagnosis not present

## 2024-07-01 DIAGNOSIS — C7800 Secondary malignant neoplasm of unspecified lung: Secondary | ICD-10-CM | POA: Diagnosis not present

## 2024-07-01 DIAGNOSIS — I10 Essential (primary) hypertension: Secondary | ICD-10-CM | POA: Diagnosis not present

## 2024-07-01 DIAGNOSIS — I4891 Unspecified atrial fibrillation: Secondary | ICD-10-CM | POA: Diagnosis not present

## 2024-07-01 DIAGNOSIS — C50912 Malignant neoplasm of unspecified site of left female breast: Secondary | ICD-10-CM | POA: Diagnosis not present

## 2024-07-02 ENCOUNTER — Inpatient Hospital Stay

## 2024-07-02 VITALS — BP 173/57 | HR 68 | Temp 98.2°F | Resp 18

## 2024-07-02 DIAGNOSIS — Z5111 Encounter for antineoplastic chemotherapy: Secondary | ICD-10-CM | POA: Diagnosis not present

## 2024-07-02 DIAGNOSIS — Z170 Estrogen receptor positive status [ER+]: Secondary | ICD-10-CM

## 2024-07-02 DIAGNOSIS — C50912 Malignant neoplasm of unspecified site of left female breast: Secondary | ICD-10-CM | POA: Diagnosis not present

## 2024-07-02 DIAGNOSIS — C7951 Secondary malignant neoplasm of bone: Secondary | ICD-10-CM | POA: Diagnosis not present

## 2024-07-02 DIAGNOSIS — Z1501 Genetic susceptibility to malignant neoplasm of breast: Secondary | ICD-10-CM | POA: Diagnosis not present

## 2024-07-02 DIAGNOSIS — C787 Secondary malignant neoplasm of liver and intrahepatic bile duct: Secondary | ICD-10-CM | POA: Diagnosis not present

## 2024-07-02 MED ORDER — FULVESTRANT 250 MG/5ML IM SOSY
500.0000 mg | PREFILLED_SYRINGE | Freq: Once | INTRAMUSCULAR | Status: AC
  Administered 2024-07-02: 500 mg via INTRAMUSCULAR
  Filled 2024-07-02: qty 10

## 2024-07-02 NOTE — Patient Instructions (Signed)
 [facility] - A DEPT OF [facility]  Discharge Instructions: Thank you for choosing [facility] to provide your oncology and hematology care.   If you have a lab appointment with the Cancer Center, please go directly to the Cancer Center and check in at the registration area.   Wear comfortable clothing and clothing appropriate for easy access to any Portacath or PICC line.   We strive to give you quality time with your provider. You may need to reschedule your appointment if you arrive late (15 or more minutes).  Arriving late affects you and other patients whose appointments are after yours.  Also, if you miss three or more appointments without notifying the office, you may be dismissed from the clinic at the provider's discretion.      For prescription refill requests, have your pharmacy contact our office and allow 72 hours for refills to be completed.    Today you received the following chemotherapy and/or immunotherapy agents Faslodex .  Fulvestrant  Injection What is this medication? FULVESTRANT  (ful VES trant) treats breast cancer. It works by blocking the hormone estrogen in breast tissue, which prevents breast cancer cells from spreading or growing. This medicine may be used for other purposes; ask your health care provider or pharmacist if you have questions. COMMON BRAND NAME(S): FASLODEX  What should I tell my care team before I take this medication? They need to know if you have any of these conditions: Bleeding disorder Liver disease Low blood cell levels (white cells, red cells, and platelets) An unusual or allergic reaction to fulvestrant , other medications, foods, dyes, or preservatives Pregnant or trying to get pregnant Breastfeeding How should I use this medication? This medication is injected into a muscle. It is given by your care team in a hospital or clinic setting. Talk to your care team about the use of this medication  in children. Special care may be needed. Overdosage: If you think you have taken too much of this medicine contact a poison control center or emergency room at once. NOTE: This medicine is only for you. Do not share this medicine with others. What if I miss a dose? Keep appointments for follow-up doses. It is important not to miss your dose. Call your care team if you are unable to keep an appointment. What may interact with this medication? Fluoroestradiol F18 This list may not describe all possible interactions. Give your health care provider a list of all the medicines, herbs, non-prescription drugs, or dietary supplements you use. Also tell them if you smoke, drink alcohol, or use illegal drugs. Some items may interact with your medicine. What should I watch for while using this medication? Your condition will be monitored carefully while you are receiving this medication. You may need blood work done while you are taking this medication. This medication is injected into a muscle. Talk to your care team if you also take medications that prevent or treat blood clots, such as warfarin. Blood thinners may increase the risk of bleeding or bruising in the muscle where this medication is injected. The benefits of this medication may outweigh the risks. Your care team can help you find the option that works for you. They can also help limit the risk of bleeding. Talk to your care team if you may be pregnant. Serious birth defects can occur if you take this medication during pregnancy and for 1 year after the last dose. You will need a negative pregnancy test before  starting this medication. Contraception is recommended while taking this medication and for 1 year after the last dose. Your care team can help you find the option that works for you. Do not breastfeed while taking this medication and for 1 year after the last dose. This medication may cause infertility. Talk to your care team if you are  concerned about your fertility. What side effects may I notice from receiving this medication? Side effects that you should report to your care team as soon as possible: Allergic reactions or angioedema--skin rash, itching or hives, swelling of the face, eyes, lips, tongue, arms, or legs, trouble swallowing or breathing Pain, tingling, or numbness in the hands or feet Side effects that usually do not require medical attention (report to your care team if they continue or are bothersome): Bone, joint, or muscle pain Constipation Headache Hot flashes Nausea Pain, redness, or irritation at injection site Unusual weakness or fatigue This list may not describe all possible side effects. Call your doctor for medical advice about side effects. You may report side effects to FDA at 1-800-FDA-1088. Where should I keep my medication? This medication is given in a hospital or clinic. It will not be stored at home. NOTE: This sheet is a summary. It may not cover all possible information. If you have questions about this medicine, talk to your doctor, pharmacist, or health care provider.  2024 Elsevier/Gold Standard (2023-05-30 00:00:00)   To help prevent nausea and vomiting after your treatment, we encourage you to take your nausea medication as directed.  BELOW ARE SYMPTOMS THAT SHOULD BE REPORTED IMMEDIATELY: *FEVER GREATER THAN 100.4 F (38 C) OR HIGHER *CHILLS OR SWEATING *NAUSEA AND VOMITING THAT IS NOT CONTROLLED WITH YOUR NAUSEA MEDICATION *UNUSUAL SHORTNESS OF BREATH *UNUSUAL BRUISING OR BLEEDING *URINARY PROBLEMS (pain or burning when urinating, or frequent urination) *BOWEL PROBLEMS (unusual diarrhea, constipation, pain near the anus) TENDERNESS IN MOUTH AND THROAT WITH OR WITHOUT PRESENCE OF ULCERS (sore throat, sores in mouth, or a toothache) UNUSUAL RASH, SWELLING OR PAIN  UNUSUAL VAGINAL DISCHARGE OR ITCHING   Items with * indicate a potential emergency and should be followed up  as soon as possible or go to the Emergency Department if any problems should occur.  Please show the CHEMOTHERAPY ALERT CARD or IMMUNOTHERAPY ALERT CARD at check-in to the Emergency Department and triage nurse.  Should you have questions after your visit or need to cancel or reschedule your appointment, please contact [facility]  Dept: [phone]  and follow the prompts.  Office hours are 8:00 a.m. to 4:30 p.m. Monday - Friday. Please note that voicemails left after 4:00 p.m. may not be returned until the following business day.  We are closed weekends and major holidays. You have access to a nurse at all times for urgent questions. Please call the main number to the clinic Dept: [phone] and follow the prompts.   For any non-urgent questions, you may also contact your provider using MyChart. We now offer e-Visits for anyone 42 and older to request care online for non-urgent symptoms. For details visit [email or web address].   Also download the MyChart app! Go to the app store, search MyChart, open the app, select [facility], and log in with your MyChart username and password.

## 2024-07-02 NOTE — Progress Notes (Signed)
 Patient presents today for Faslodex  injections. Patient tolerated injections with no complaints voiced.  Site clean and dry with no bruising or swelling noted.  No complaints of pain.  Discharged with vital signs stable and no signs or symptoms of distress noted.

## 2024-07-05 DIAGNOSIS — I4891 Unspecified atrial fibrillation: Secondary | ICD-10-CM | POA: Diagnosis not present

## 2024-07-05 DIAGNOSIS — C787 Secondary malignant neoplasm of liver and intrahepatic bile duct: Secondary | ICD-10-CM | POA: Diagnosis not present

## 2024-07-05 DIAGNOSIS — C50912 Malignant neoplasm of unspecified site of left female breast: Secondary | ICD-10-CM | POA: Diagnosis not present

## 2024-07-05 DIAGNOSIS — C7800 Secondary malignant neoplasm of unspecified lung: Secondary | ICD-10-CM | POA: Diagnosis not present

## 2024-07-05 DIAGNOSIS — I10 Essential (primary) hypertension: Secondary | ICD-10-CM | POA: Diagnosis not present

## 2024-07-05 DIAGNOSIS — C7951 Secondary malignant neoplasm of bone: Secondary | ICD-10-CM | POA: Diagnosis not present

## 2024-07-06 DIAGNOSIS — C7800 Secondary malignant neoplasm of unspecified lung: Secondary | ICD-10-CM | POA: Diagnosis not present

## 2024-07-06 DIAGNOSIS — C787 Secondary malignant neoplasm of liver and intrahepatic bile duct: Secondary | ICD-10-CM | POA: Diagnosis not present

## 2024-07-06 DIAGNOSIS — I10 Essential (primary) hypertension: Secondary | ICD-10-CM | POA: Diagnosis not present

## 2024-07-06 DIAGNOSIS — C50912 Malignant neoplasm of unspecified site of left female breast: Secondary | ICD-10-CM | POA: Diagnosis not present

## 2024-07-06 DIAGNOSIS — I4891 Unspecified atrial fibrillation: Secondary | ICD-10-CM | POA: Diagnosis not present

## 2024-07-06 DIAGNOSIS — C7951 Secondary malignant neoplasm of bone: Secondary | ICD-10-CM | POA: Diagnosis not present

## 2024-07-07 DIAGNOSIS — I5032 Chronic diastolic (congestive) heart failure: Secondary | ICD-10-CM | POA: Diagnosis not present

## 2024-07-07 DIAGNOSIS — C50912 Malignant neoplasm of unspecified site of left female breast: Secondary | ICD-10-CM | POA: Diagnosis not present

## 2024-07-07 DIAGNOSIS — I2510 Atherosclerotic heart disease of native coronary artery without angina pectoris: Secondary | ICD-10-CM | POA: Diagnosis not present

## 2024-07-07 DIAGNOSIS — S22080A Wedge compression fracture of T11-T12 vertebra, initial encounter for closed fracture: Secondary | ICD-10-CM | POA: Diagnosis not present

## 2024-07-07 DIAGNOSIS — N184 Chronic kidney disease, stage 4 (severe): Secondary | ICD-10-CM | POA: Diagnosis not present

## 2024-07-07 DIAGNOSIS — C7800 Secondary malignant neoplasm of unspecified lung: Secondary | ICD-10-CM | POA: Diagnosis not present

## 2024-07-07 DIAGNOSIS — K219 Gastro-esophageal reflux disease without esophagitis: Secondary | ICD-10-CM | POA: Diagnosis not present

## 2024-07-07 DIAGNOSIS — C787 Secondary malignant neoplasm of liver and intrahepatic bile duct: Secondary | ICD-10-CM | POA: Diagnosis not present

## 2024-07-07 DIAGNOSIS — I4891 Unspecified atrial fibrillation: Secondary | ICD-10-CM | POA: Diagnosis not present

## 2024-07-07 DIAGNOSIS — I10 Essential (primary) hypertension: Secondary | ICD-10-CM | POA: Diagnosis not present

## 2024-07-07 DIAGNOSIS — C7951 Secondary malignant neoplasm of bone: Secondary | ICD-10-CM | POA: Diagnosis not present

## 2024-07-07 DIAGNOSIS — D649 Anemia, unspecified: Secondary | ICD-10-CM | POA: Diagnosis not present

## 2024-07-08 DIAGNOSIS — C50912 Malignant neoplasm of unspecified site of left female breast: Secondary | ICD-10-CM | POA: Diagnosis not present

## 2024-07-08 DIAGNOSIS — C787 Secondary malignant neoplasm of liver and intrahepatic bile duct: Secondary | ICD-10-CM | POA: Diagnosis not present

## 2024-07-08 DIAGNOSIS — C7800 Secondary malignant neoplasm of unspecified lung: Secondary | ICD-10-CM | POA: Diagnosis not present

## 2024-07-08 DIAGNOSIS — I4891 Unspecified atrial fibrillation: Secondary | ICD-10-CM | POA: Diagnosis not present

## 2024-07-08 DIAGNOSIS — C7951 Secondary malignant neoplasm of bone: Secondary | ICD-10-CM | POA: Diagnosis not present

## 2024-07-08 DIAGNOSIS — I10 Essential (primary) hypertension: Secondary | ICD-10-CM | POA: Diagnosis not present

## 2024-07-21 ENCOUNTER — Encounter: Payer: Self-pay | Admitting: Oncology

## 2024-07-22 ENCOUNTER — Inpatient Hospital Stay: Admitting: Diagnostic Neuroimaging

## 2024-07-29 ENCOUNTER — Other Ambulatory Visit

## 2024-07-29 ENCOUNTER — Ambulatory Visit: Admitting: Oncology

## 2024-08-07 DEATH — deceased
